# Patient Record
Sex: Female | Born: 1940
Health system: Southern US, Community
[De-identification: ages and names within clinical notes are randomized; demographics above are authoritative.]

## PROBLEM LIST (undated history)

## (undated) DIAGNOSIS — H9190 Unspecified hearing loss, unspecified ear: Secondary | ICD-10-CM

## (undated) DIAGNOSIS — C801 Malignant (primary) neoplasm, unspecified: Secondary | ICD-10-CM

## (undated) DIAGNOSIS — R112 Nausea with vomiting, unspecified: Secondary | ICD-10-CM

## (undated) DIAGNOSIS — I10 Essential (primary) hypertension: Secondary | ICD-10-CM

## (undated) DIAGNOSIS — F329 Major depressive disorder, single episode, unspecified: Secondary | ICD-10-CM

## (undated) DIAGNOSIS — R002 Palpitations: Secondary | ICD-10-CM

## (undated) DIAGNOSIS — K219 Gastro-esophageal reflux disease without esophagitis: Secondary | ICD-10-CM

## (undated) DIAGNOSIS — M4317 Spondylolisthesis, lumbosacral region: Secondary | ICD-10-CM

## (undated) DIAGNOSIS — L0390 Cellulitis, unspecified: Secondary | ICD-10-CM

## (undated) DIAGNOSIS — Z9889 Other specified postprocedural states: Secondary | ICD-10-CM

## (undated) DIAGNOSIS — M1990 Unspecified osteoarthritis, unspecified site: Secondary | ICD-10-CM

## (undated) DIAGNOSIS — F1010 Alcohol abuse, uncomplicated: Secondary | ICD-10-CM

## (undated) DIAGNOSIS — F419 Anxiety disorder, unspecified: Secondary | ICD-10-CM

## (undated) DIAGNOSIS — T4145XA Adverse effect of unspecified anesthetic, initial encounter: Secondary | ICD-10-CM

## (undated) DIAGNOSIS — F32A Depression, unspecified: Secondary | ICD-10-CM

## (undated) DIAGNOSIS — N390 Urinary tract infection, site not specified: Secondary | ICD-10-CM

## (undated) DIAGNOSIS — G9340 Encephalopathy, unspecified: Secondary | ICD-10-CM

## (undated) HISTORY — PX: BREAST SURGERY: SHX581

## (undated) HISTORY — PX: EYE SURGERY: SHX253

## (undated) HISTORY — PX: TONSILLECTOMY: SUR1361

## (undated) HISTORY — PX: TUBAL LIGATION: SHX77

---

## 1998-05-28 ENCOUNTER — Ambulatory Visit (HOSPITAL_BASED_OUTPATIENT_CLINIC_OR_DEPARTMENT_OTHER): Admission: RE | Admit: 1998-05-28 | Discharge: 1998-05-28 | Payer: Self-pay | Admitting: General Surgery

## 1999-06-05 ENCOUNTER — Other Ambulatory Visit: Admission: RE | Admit: 1999-06-05 | Discharge: 1999-06-05 | Payer: Self-pay | Admitting: Obstetrics and Gynecology

## 2007-03-18 DIAGNOSIS — T8859XA Other complications of anesthesia, initial encounter: Secondary | ICD-10-CM

## 2007-03-18 HISTORY — DX: Other complications of anesthesia, initial encounter: T88.59XA

## 2007-03-18 HISTORY — PX: OTHER SURGICAL HISTORY: SHX169

## 2007-10-22 ENCOUNTER — Other Ambulatory Visit: Admission: RE | Admit: 2007-10-22 | Discharge: 2007-10-22 | Payer: Self-pay | Admitting: Family Medicine

## 2009-03-17 HISTORY — PX: COLONOSCOPY: SHX174

## 2009-03-29 ENCOUNTER — Encounter: Admission: RE | Admit: 2009-03-29 | Discharge: 2009-03-29 | Payer: Self-pay | Admitting: Neurosurgery

## 2009-04-11 ENCOUNTER — Encounter: Admission: RE | Admit: 2009-04-11 | Discharge: 2009-04-11 | Payer: Self-pay | Admitting: Neurosurgery

## 2009-04-20 ENCOUNTER — Encounter: Admission: RE | Admit: 2009-04-20 | Discharge: 2009-04-20 | Payer: Self-pay | Admitting: Family Medicine

## 2009-04-23 ENCOUNTER — Encounter: Admission: RE | Admit: 2009-04-23 | Discharge: 2009-04-23 | Payer: Self-pay | Admitting: Family Medicine

## 2009-05-18 ENCOUNTER — Encounter: Admission: RE | Admit: 2009-05-18 | Discharge: 2009-05-18 | Payer: Self-pay | Admitting: Neurosurgery

## 2009-09-14 HISTORY — PX: JOINT REPLACEMENT: SHX530

## 2009-09-24 ENCOUNTER — Inpatient Hospital Stay (HOSPITAL_COMMUNITY): Admission: RE | Admit: 2009-09-24 | Discharge: 2009-09-26 | Payer: Self-pay | Admitting: Orthopedic Surgery

## 2009-10-06 ENCOUNTER — Ambulatory Visit: Payer: Self-pay | Admitting: Vascular Surgery

## 2009-10-06 ENCOUNTER — Encounter (INDEPENDENT_AMBULATORY_CARE_PROVIDER_SITE_OTHER): Payer: Self-pay | Admitting: Emergency Medicine

## 2009-10-06 ENCOUNTER — Emergency Department (HOSPITAL_COMMUNITY): Admission: EM | Admit: 2009-10-06 | Discharge: 2009-10-06 | Payer: Self-pay | Admitting: Emergency Medicine

## 2010-06-01 LAB — POCT I-STAT, CHEM 8
BUN: 4 mg/dL — ABNORMAL LOW (ref 6–23)
Calcium, Ion: 1.05 mmol/L — ABNORMAL LOW (ref 1.12–1.32)
Calcium, Ion: 1.15 mmol/L (ref 1.12–1.32)
Creatinine, Ser: 0.9 mg/dL (ref 0.4–1.2)
Glucose, Bld: 122 mg/dL — ABNORMAL HIGH (ref 70–99)
Glucose, Bld: 91 mg/dL (ref 70–99)
HCT: 38 % (ref 36.0–46.0)
Hemoglobin: 12.9 g/dL (ref 12.0–15.0)
Sodium: 136 mEq/L (ref 135–145)

## 2010-06-01 LAB — CBC
HCT: 34.9 % — ABNORMAL LOW (ref 36.0–46.0)
MCH: 32.8 pg (ref 26.0–34.0)
MCHC: 34.2 g/dL (ref 30.0–36.0)
Platelets: 213 10*3/uL (ref 150–400)
RDW: 15.4 % (ref 11.5–15.5)
WBC: 5.9 10*3/uL (ref 4.0–10.5)

## 2010-06-01 LAB — DIFFERENTIAL
Basophils Relative: 0 % (ref 0–1)
Eosinophils Relative: 2 % (ref 0–5)
Lymphocytes Relative: 29 % (ref 12–46)
Lymphs Abs: 1.7 10*3/uL (ref 0.7–4.0)
Monocytes Relative: 9 % (ref 3–12)
Neutro Abs: 3.5 10*3/uL (ref 1.7–7.7)

## 2010-06-02 LAB — CBC
HCT: 30.9 % — ABNORMAL LOW (ref 36.0–46.0)
HCT: 31.4 % — ABNORMAL LOW (ref 36.0–46.0)
HCT: 46.3 % — ABNORMAL HIGH (ref 36.0–46.0)
Hemoglobin: 10.7 g/dL — ABNORMAL LOW (ref 12.0–15.0)
Hemoglobin: 10.8 g/dL — ABNORMAL LOW (ref 12.0–15.0)
Hemoglobin: 15.8 g/dL — ABNORMAL HIGH (ref 12.0–15.0)
MCH: 32.5 pg (ref 26.0–34.0)
MCHC: 34.2 g/dL (ref 30.0–36.0)
MCHC: 34.8 g/dL (ref 30.0–36.0)
MCV: 94.6 fL (ref 78.0–100.0)
MCV: 95.7 fL (ref 78.0–100.0)
RBC: 4.89 MIL/uL (ref 3.87–5.11)
RDW: 13.7 % (ref 11.5–15.5)
WBC: 9 10*3/uL (ref 4.0–10.5)

## 2010-06-02 LAB — COMPREHENSIVE METABOLIC PANEL
ALT: 18 U/L (ref 0–35)
AST: 30 U/L (ref 0–37)
Alkaline Phosphatase: 108 U/L (ref 39–117)
BUN: 14 mg/dL (ref 6–23)
GFR calc Af Amer: >60 mL/min (ref >60)
Total Bilirubin: 0.7 mg/dL (ref 0.3–1.2)

## 2010-06-02 LAB — BASIC METABOLIC PANEL
BUN: 7 mg/dL (ref 6–23)
CO2: 30 mEq/L (ref 19–32)
Calcium: 7.8 mg/dL — ABNORMAL LOW (ref 8.4–10.5)
Chloride: 104 mEq/L (ref 96–112)
Creatinine, Ser: 0.69 mg/dL (ref 0.4–1.2)
Creatinine, Ser: 0.71 mg/dL (ref 0.4–1.2)
GFR calc Af Amer: >60 mL/min (ref >60)
GFR calc non Af Amer: >60 mL/min (ref >60)
Glucose, Bld: 134 mg/dL — ABNORMAL HIGH (ref 70–99)
Sodium: 136 mEq/L (ref 135–145)

## 2010-06-02 LAB — DIFFERENTIAL
Basophils Absolute: 0 10*3/uL (ref 0.0–0.1)
Eosinophils Absolute: 0.1 10*3/uL (ref 0.0–0.7)
Eosinophils Relative: 1 % (ref 0–5)
Lymphocytes Relative: 31 % (ref 12–46)
Monocytes Relative: 10 % (ref 3–12)
Neutro Abs: 3.8 10*3/uL (ref 1.7–7.7)

## 2010-06-02 LAB — URINE CULTURE

## 2010-06-02 LAB — CROSSMATCH

## 2010-06-02 LAB — URINALYSIS, ROUTINE W REFLEX MICROSCOPIC
Glucose, UA: NEGATIVE mg/dL
Nitrite: NEGATIVE
Specific Gravity, Urine: 1.025 (ref 1.005–1.030)
pH: 5.5 (ref 5.0–8.0)

## 2010-06-02 LAB — URINE MICROSCOPIC-ADD ON

## 2010-06-02 LAB — SURGICAL PCR SCREEN: MRSA, PCR: NEGATIVE

## 2010-07-02 ENCOUNTER — Other Ambulatory Visit: Payer: Self-pay | Admitting: Family Medicine

## 2010-07-02 DIAGNOSIS — K829 Disease of gallbladder, unspecified: Secondary | ICD-10-CM

## 2010-07-03 ENCOUNTER — Ambulatory Visit
Admission: RE | Admit: 2010-07-03 | Discharge: 2010-07-03 | Disposition: A | Discharge status: Home / Self Care | Payer: Medicare Other | Source: Ambulatory Visit | Attending: Family Medicine | Admitting: Family Medicine

## 2010-07-03 DIAGNOSIS — K829 Disease of gallbladder, unspecified: Secondary | ICD-10-CM

## 2010-07-17 ENCOUNTER — Other Ambulatory Visit: Payer: Self-pay | Admitting: Gastroenterology

## 2010-07-17 DIAGNOSIS — K7689 Other specified diseases of liver: Secondary | ICD-10-CM

## 2010-07-23 ENCOUNTER — Other Ambulatory Visit: Payer: Self-pay | Admitting: Gastroenterology

## 2010-07-23 ENCOUNTER — Ambulatory Visit
Admission: RE | Admit: 2010-07-23 | Discharge: 2010-07-23 | Disposition: A | Discharge status: Home / Self Care | Payer: Medicare Other | Source: Ambulatory Visit | Attending: Gastroenterology | Admitting: Gastroenterology

## 2010-07-23 DIAGNOSIS — K7689 Other specified diseases of liver: Secondary | ICD-10-CM

## 2010-07-23 DIAGNOSIS — K769 Liver disease, unspecified: Secondary | ICD-10-CM

## 2010-07-27 ENCOUNTER — Other Ambulatory Visit: Payer: Medicare Other

## 2010-08-01 ENCOUNTER — Ambulatory Visit
Admission: RE | Admit: 2010-08-01 | Discharge: 2010-08-01 | Disposition: A | Discharge status: Home / Self Care | Payer: Medicare Other | Source: Ambulatory Visit | Attending: Gastroenterology | Admitting: Gastroenterology

## 2010-08-01 DIAGNOSIS — K769 Liver disease, unspecified: Secondary | ICD-10-CM

## 2010-08-01 MED ORDER — GADOBENATE DIMEGLUMINE 529 MG/ML IV SOLN
20.0000 mL | Freq: Once | INTRAVENOUS | Status: AC | PRN
  Administered 2010-08-01: 20 mL via INTRAVENOUS

## 2011-01-29 ENCOUNTER — Other Ambulatory Visit: Payer: Self-pay | Admitting: Gastroenterology

## 2011-01-29 DIAGNOSIS — K769 Liver disease, unspecified: Secondary | ICD-10-CM

## 2011-02-04 ENCOUNTER — Ambulatory Visit
Admission: RE | Admit: 2011-02-04 | Discharge: 2011-02-04 | Disposition: A | Discharge status: Home / Self Care | Payer: Medicare Other | Source: Ambulatory Visit | Attending: Gastroenterology | Admitting: Gastroenterology

## 2011-02-04 DIAGNOSIS — K769 Liver disease, unspecified: Secondary | ICD-10-CM

## 2011-02-04 MED ORDER — GADOBENATE DIMEGLUMINE 529 MG/ML IV SOLN: 19.0000 mL | Freq: Once | INTRAVENOUS | Status: AC | PRN

## 2011-02-04 MED ORDER — GADOBENATE DIMEGLUMINE 529 MG/ML IV SOLN
19.0000 mL | Freq: Once | INTRAVENOUS | Status: AC | PRN
  Administered 2011-02-04: 19 mL via INTRAVENOUS

## 2011-12-08 ENCOUNTER — Inpatient Hospital Stay: Admit: 2011-12-08 | Payer: Self-pay | Admitting: Orthopedic Surgery

## 2011-12-08 SURGERY — ARTHROPLASTY, KNEE, TOTAL
Anesthesia: Regional | Laterality: Right

## 2012-01-09 ENCOUNTER — Other Ambulatory Visit: Payer: Self-pay | Admitting: Gastroenterology

## 2012-01-09 DIAGNOSIS — K769 Liver disease, unspecified: Secondary | ICD-10-CM

## 2012-01-20 ENCOUNTER — Other Ambulatory Visit: Payer: Medicare Other

## 2012-01-27 ENCOUNTER — Other Ambulatory Visit: Payer: Medicare Other

## 2012-02-25 ENCOUNTER — Other Ambulatory Visit: Payer: Medicare Other

## 2012-03-24 ENCOUNTER — Other Ambulatory Visit: Payer: Medicare Other

## 2012-03-30 ENCOUNTER — Inpatient Hospital Stay: Admission: RE | Admit: 2012-03-30 | Payer: Medicare Other | Source: Ambulatory Visit

## 2012-04-06 ENCOUNTER — Other Ambulatory Visit: Payer: Medicare Other

## 2012-04-15 ENCOUNTER — Other Ambulatory Visit: Payer: Medicare Other

## 2012-04-26 ENCOUNTER — Other Ambulatory Visit: Payer: Medicare Other

## 2012-04-28 ENCOUNTER — Other Ambulatory Visit: Payer: Medicare Other

## 2012-05-04 ENCOUNTER — Other Ambulatory Visit: Payer: Medicare Other

## 2012-05-11 ENCOUNTER — Ambulatory Visit: Admission: RE | Admit: 2012-05-11 | Payer: Medicare Other | Source: Ambulatory Visit

## 2012-06-24 ENCOUNTER — Other Ambulatory Visit: Payer: Medicare Other

## 2012-06-29 ENCOUNTER — Ambulatory Visit
Admission: RE | Admit: 2012-06-29 | Discharge: 2012-06-29 | Disposition: A | Discharge status: Home / Self Care | Payer: Medicare Other | Source: Ambulatory Visit | Attending: Gastroenterology | Admitting: Gastroenterology

## 2012-06-29 DIAGNOSIS — K769 Liver disease, unspecified: Secondary | ICD-10-CM

## 2012-06-29 MED ORDER — GADOBENATE DIMEGLUMINE 529 MG/ML IV SOLN
19.0000 mL | Freq: Once | INTRAVENOUS | Status: AC | PRN
  Administered 2012-06-29: 19 mL via INTRAVENOUS

## 2013-06-06 ENCOUNTER — Other Ambulatory Visit: Payer: Self-pay | Admitting: Family Medicine

## 2013-06-06 DIAGNOSIS — M79606 Pain in leg, unspecified: Secondary | ICD-10-CM

## 2013-06-07 ENCOUNTER — Ambulatory Visit
Admission: RE | Admit: 2013-06-07 | Discharge: 2013-06-07 | Disposition: A | Discharge status: Home / Self Care | Payer: Medicare Other | Source: Ambulatory Visit | Attending: Family Medicine | Admitting: Family Medicine

## 2013-06-07 DIAGNOSIS — M79606 Pain in leg, unspecified: Secondary | ICD-10-CM

## 2013-08-02 ENCOUNTER — Other Ambulatory Visit (HOSPITAL_COMMUNITY): Payer: Medicare Other

## 2013-08-16 ENCOUNTER — Ambulatory Visit (HOSPITAL_COMMUNITY): Payer: Medicare Other | Attending: Family Medicine | Admitting: Cardiology

## 2013-08-16 ENCOUNTER — Other Ambulatory Visit (HOSPITAL_COMMUNITY): Payer: Self-pay | Admitting: Family Medicine

## 2013-08-16 DIAGNOSIS — R609 Edema, unspecified: Secondary | ICD-10-CM | POA: Insufficient documentation

## 2013-08-16 NOTE — Progress Notes (Signed)
Echo performed. 

## 2013-08-18 ENCOUNTER — Telehealth: Payer: Self-pay | Admitting: *Deleted

## 2013-08-18 NOTE — Progress Notes (Signed)
Quick Note:  LMTCB 6/4 ______ 

## 2013-08-19 NOTE — Telephone Encounter (Deleted)
Error

## 2013-09-20 ENCOUNTER — Encounter (HOSPITAL_COMMUNITY): Payer: Medicare Other

## 2013-11-10 ENCOUNTER — Ambulatory Visit (INDEPENDENT_AMBULATORY_CARE_PROVIDER_SITE_OTHER): Payer: Medicare Other | Admitting: Cardiology

## 2013-11-10 ENCOUNTER — Encounter: Payer: Self-pay | Admitting: Cardiology

## 2013-11-10 VITALS — BP 150/100 | HR 96 | Ht 67.5 in | Wt 219.0 lb

## 2013-11-10 DIAGNOSIS — M25561 Pain in right knee: Secondary | ICD-10-CM | POA: Insufficient documentation

## 2013-11-10 DIAGNOSIS — Z01810 Encounter for preprocedural cardiovascular examination: Secondary | ICD-10-CM

## 2013-11-10 DIAGNOSIS — I10 Essential (primary) hypertension: Secondary | ICD-10-CM | POA: Insufficient documentation

## 2013-11-10 DIAGNOSIS — M25569 Pain in unspecified knee: Secondary | ICD-10-CM

## 2013-11-10 DIAGNOSIS — E669 Obesity, unspecified: Secondary | ICD-10-CM

## 2013-11-10 DIAGNOSIS — I4460 Unspecified fascicular block: Secondary | ICD-10-CM

## 2013-11-10 DIAGNOSIS — I444 Left anterior fascicular block: Secondary | ICD-10-CM

## 2013-11-10 NOTE — Progress Notes (Signed)
[facility]. [address] [place], [place]  [address] Phone: [phone] Fax:  [phone]  Date:  11/10/2013   ID:  CATRENA VARI, DOB 10/09/1940, MRN [id number]  PCP:  No primary provider on file.   History of Present Illness: Denise Kelly is a 73 y.o. female here for preoperative risk stratification for left knee surgery, Dr. Ronnie Derby. Overall, she has been doing very well. She does not complain of any current chest pain, shortness of breath, syncope. She has been able to complete housework without difficulty. Of course, her left knee is limiting her from performing extreme physical activity.   Interestingly, she states that for 15 years while taking Toprol, she said that she had chest pain and thought that she was going to die. Since stopping this medication, she has felt much better.   She's had several cardiac catheterizations and stress test which came back normal per patient. A most recent event monitor in February 2009 showed rare PVCs. Last stress test 2/11-low risk with no ischemia.   Her blood pressure currently is elevated, asymptomatic. Dr. Justin Mend has been monitoring this closely. Usually it is within normal range.  For leg edema, and echocardiogram was ordered and was reassuring, normal ejection fraction. Normal right-sided structure.   Wt Readings from Last 3 Encounters:  11/10/13 219 lb (99.338 kg)     No past medical history on file.  No past surgical history on file.  Current Outpatient Prescriptions  Medication Sig Dispense Refill  . citalopram (CELEXA) 10 MG tablet       . furosemide (LASIX) 20 MG tablet 20 mg as needed.      . gabapentin (NEURONTIN) 100 MG capsule 100 mg at bedtime.      . hydrOXYzine (ATARAX/VISTARIL) 25 MG tablet       . methocarbamol (ROBAXIN) 500 MG tablet       . ondansetron (ZOFRAN-ODT) 4 MG disintegrating tablet       . traMADol (ULTRAM) 50 MG tablet as needed.       No current facility-administered medications for this  visit.    Allergies:    Allergies  Allergen Reactions  . Sulfa Antibiotics Hives and Swelling  . Ciprofloxacin Itching    Social History:  The patient  reports that she has quit smoking. She does not have any smokeless tobacco history on file.   No family history on file.  No early family history of coronary artery disease  ROS:  Please see the history of present illness.   Right knee pain, right leg swelling.   All other systems reviewed and negative.   PHYSICAL EXAM: VS:  BP 150/100  Pulse 96  Ht 5' 7.5" (1.715 m)  Wt 219 lb (99.338 kg)  BMI 33.77 kg/m2 Well nourished, well developed, in no acute distress HEENT: normal, [place]/AT, EOMI Neck: no JVD, normal carotid upstroke, no bruit Cardiac:  normal S1, S2; RRR; no murmur Lungs:  clear to auscultation bilaterally, no wheezing, rhonchi or rales Abd: soft, nontender, no hepatomegaly, no bruits Ext: no edema, 2+ distal pulses Skin: warm and dry GU: deferred Neuro: no focal abnormalities noted, AAO x 3  EKG:  Sinus rhythm 96, left anterior fascicular block. QT interval prolonged, 110 ms, QTC measured 517 ms.  ASSESSMENT AND PLAN:  1. Preoperative risk stratification for right knee replacement secondary to knee pain - she may proceed from a cardiac perspective. She should be low risk based upon her lack of anginal  symptoms, shortness of breath. Prior nuclear stress test in 2011 reassuring. Her blood pressure was elevated today however it has been in normal range under the care of Dr. Justin Mend. 2. Hypertension-continue to follow with Dr. Justin Mend. She stopped her Toprol secondary to undue side effects. 3. Obesity-encourage weight loss. 4. We will see back on an as-needed basis.  Signed, Candee Furbish, MD [facility]  11/10/2013 4:01 PM

## 2013-11-10 NOTE — Patient Instructions (Signed)
The current medical regimen is effective;  continue present plan and medications.  You have been cleared for surgery.  Follow up as needed. 

## 2013-11-11 ENCOUNTER — Telehealth: Payer: Self-pay | Admitting: Cardiology

## 2013-11-11 ENCOUNTER — Encounter: Payer: Self-pay | Admitting: Cardiology

## 2013-11-11 NOTE — Telephone Encounter (Signed)
Received request from Nurse fax box, documents faxed for surgical clearance. To: [facility]  Fax number: [phone] Attention: 8.28.15/km

## 2013-11-28 ENCOUNTER — Other Ambulatory Visit: Payer: Self-pay | Admitting: Gastroenterology

## 2013-12-05 ENCOUNTER — Other Ambulatory Visit: Payer: Self-pay | Admitting: Orthopedic Surgery

## 2013-12-20 ENCOUNTER — Other Ambulatory Visit: Payer: Self-pay | Admitting: Obstetrics and Gynecology

## 2013-12-20 ENCOUNTER — Other Ambulatory Visit (HOSPITAL_COMMUNITY)
Admission: RE | Admit: 2013-12-20 | Discharge: 2013-12-20 | Disposition: A | Discharge status: Home / Self Care | Payer: Medicare Other | Source: Ambulatory Visit | Attending: Obstetrics and Gynecology | Admitting: Obstetrics and Gynecology

## 2013-12-20 DIAGNOSIS — Z1151 Encounter for screening for human papillomavirus (HPV): Secondary | ICD-10-CM | POA: Insufficient documentation

## 2013-12-20 DIAGNOSIS — Z124 Encounter for screening for malignant neoplasm of cervix: Secondary | ICD-10-CM | POA: Insufficient documentation

## 2013-12-23 ENCOUNTER — Inpatient Hospital Stay (HOSPITAL_COMMUNITY): Admission: RE | Admit: 2013-12-23 | Payer: Medicare Other | Source: Ambulatory Visit

## 2013-12-27 NOTE — Telephone Encounter (Signed)
error 

## 2013-12-29 ENCOUNTER — Encounter (HOSPITAL_COMMUNITY)
Admission: RE | Admit: 2013-12-29 | Discharge: 2013-12-29 | Disposition: A | Discharge status: Home / Self Care | Payer: Medicare Other | Source: Ambulatory Visit | Attending: Orthopedic Surgery | Admitting: Orthopedic Surgery

## 2013-12-29 ENCOUNTER — Encounter (HOSPITAL_COMMUNITY): Payer: Self-pay

## 2013-12-29 DIAGNOSIS — I444 Left anterior fascicular block: Secondary | ICD-10-CM | POA: Diagnosis not present

## 2013-12-29 DIAGNOSIS — M25561 Pain in right knee: Secondary | ICD-10-CM | POA: Insufficient documentation

## 2013-12-29 DIAGNOSIS — N390 Urinary tract infection, site not specified: Secondary | ICD-10-CM | POA: Insufficient documentation

## 2013-12-29 DIAGNOSIS — Z01818 Encounter for other preprocedural examination: Secondary | ICD-10-CM | POA: Diagnosis present

## 2013-12-29 HISTORY — DX: Palpitations: R00.2

## 2013-12-29 HISTORY — DX: Adverse effect of unspecified anesthetic, initial encounter: T41.45XA

## 2013-12-29 HISTORY — DX: Malignant (primary) neoplasm, unspecified: C80.1

## 2013-12-29 HISTORY — DX: Anxiety disorder, unspecified: F41.9

## 2013-12-29 HISTORY — DX: Essential (primary) hypertension: I10

## 2013-12-29 HISTORY — DX: Gastro-esophageal reflux disease without esophagitis: K21.9

## 2013-12-29 HISTORY — DX: Unspecified osteoarthritis, unspecified site: M19.90

## 2013-12-29 LAB — COMPREHENSIVE METABOLIC PANEL
ALBUMIN: 3.6 g/dL (ref 3.5–5.2)
ALT: 28 U/L (ref 0–35)
ANION GAP: 13 (ref 5–15)
AST: 35 U/L (ref 0–37)
Alkaline Phosphatase: 113 U/L (ref 39–117)
BUN: 11 mg/dL (ref 6–23)
CO2: 28 mEq/L (ref 19–32)
CREATININE: 0.78 mg/dL (ref 0.50–1.10)
Calcium: 9 mg/dL (ref 8.4–10.5)
Chloride: 100 mEq/L (ref 96–112)
GFR calc Af Amer: >90 mL/min (ref >90)
GFR calc non Af Amer: 82 mL/min — ABNORMAL LOW (ref >90)
Glucose, Bld: 101 mg/dL — ABNORMAL HIGH (ref 70–99)
Potassium: 4 mEq/L (ref 3.7–5.3)
Sodium: 141 mEq/L (ref 137–147)
TOTAL PROTEIN: 6.9 g/dL (ref 6.0–8.3)
Total Bilirubin: 0.7 mg/dL (ref 0.3–1.2)

## 2013-12-29 LAB — CBC WITH DIFFERENTIAL/PLATELET
BASOS PCT: 1 % (ref 0–1)
Basophils Absolute: 0 10*3/uL (ref 0.0–0.1)
EOS ABS: 0.1 10*3/uL (ref 0.0–0.7)
Eosinophils Relative: 2 % (ref 0–5)
HEMATOCRIT: 44.8 % (ref 36.0–46.0)
HEMOGLOBIN: 15 g/dL (ref 12.0–15.0)
Lymphocytes Relative: 40 % (ref 12–46)
Lymphs Abs: 2.8 10*3/uL (ref 0.7–4.0)
MCH: 32.4 pg (ref 26.0–34.0)
MCHC: 33.5 g/dL (ref 30.0–36.0)
MCV: 96.8 fL (ref 78.0–100.0)
MONO ABS: 0.7 10*3/uL (ref 0.1–1.0)
MONOS PCT: 9 % (ref 3–12)
NEUTROS PCT: 48 % (ref 43–77)
Neutro Abs: 3.3 10*3/uL (ref 1.7–7.7)
Platelets: 148 10*3/uL — ABNORMAL LOW (ref 150–400)
RBC: 4.63 MIL/uL (ref 3.87–5.11)
RDW: 12.1 % (ref 11.5–15.5)
WBC: 7 10*3/uL (ref 4.0–10.5)

## 2013-12-29 LAB — URINALYSIS, ROUTINE W REFLEX MICROSCOPIC
Bilirubin Urine: NEGATIVE
Glucose, UA: NEGATIVE mg/dL
Ketones, ur: NEGATIVE mg/dL
LEUKOCYTES UA: NEGATIVE
Nitrite: NEGATIVE
PROTEIN: NEGATIVE mg/dL
Specific Gravity, Urine: 1.023 (ref 1.005–1.030)
Urobilinogen, UA: 0.2 mg/dL (ref 0.0–1.0)
pH: 6.5 (ref 5.0–8.0)

## 2013-12-29 LAB — SURGICAL PCR SCREEN
MRSA, PCR: NEGATIVE
Staphylococcus aureus: POSITIVE — AB

## 2013-12-29 LAB — URINE MICROSCOPIC-ADD ON

## 2013-12-29 LAB — APTT: aPTT: 29 seconds (ref 24–37)

## 2013-12-29 LAB — PROTIME-INR
INR: 1.16 (ref 0.00–1.49)
PROTHROMBIN TIME: 14.9 s (ref 11.6–15.2)

## 2013-12-29 NOTE — Pre-Procedure Instructions (Signed)
OLAR SANTINI  12/29/2013   Your procedure is scheduled on:  Monday, October 19th  Report to [facility] at [phone] AM.  Call this number if you have problems the morning of surgery: [phone]   Remember:   Do not eat food or drink liquids after midnight.   Take these medicines the morning of surgery with A SIP OF WATER: celexa, pain medication if needed   Do not wear jewelry, make-up or nail polish.  Do not wear lotions, powders, or perfumes. You may wear deodorant.  Do not shave 48 hours prior to surgery. Men may shave face and neck.  Do not bring valuables to the hospital.  [facility] is not responsible   for any belongings or valuables.               Contacts, dentures or bridgework may not be worn into surgery.  Leave suitcase in the car. After surgery it may be brought to your room.  For patients admitted to the hospital, discharge time is determined by your  treatment team.         Please read over the following fact sheets that you were given: Pain Booklet, Coughing and Deep Breathing, MRSA Information and Surgical Site Infection Prevention [place] - Preparing for Surgery  Before surgery, you can play an important role.  Because skin is not sterile, your skin needs to be as free of germs as possible.  You can reduce the number of germs on you skin by washing with CHG (chlorahexidine gluconate) soap before surgery.  CHG is an antiseptic cleaner which kills germs and bonds with the skin to continue killing germs even after washing.  Please DO NOT use if you have an allergy to CHG or antibacterial soaps.  If your skin becomes reddened/irritated stop using the CHG and inform your nurse when you arrive at Short Stay.  Do not shave (including legs and underarms) for at least 48 hours prior to the first CHG shower.  You may shave your face.  Please follow these instructions carefully:   1.  Shower with CHG Soap the night before surgery and the morning of  Surgery.  2.  If you choose to wash your hair, wash your hair first as usual with your normal shampoo.  3.  After you shampoo, rinse your hair and body thoroughly to remove the shampoo.  4.  Use CHG as you would any other liquid soap.  You can apply CHG directly to the skin and wash gently with scrungie or a clean washcloth.  5.  Apply the CHG Soap to your body ONLY FROM THE NECK DOWN.  Do not use on open wounds or open sores.  Avoid contact with your eyes, ears, mouth and genitals (private parts).  Wash genitals (private parts) with your normal soap.  6.  Wash thoroughly, paying special attention to the area where your surgery will be performed.  7.  Thoroughly rinse your body with warm water from the neck down.  8.  DO NOT shower/wash with your normal soap after using and rinsing off the CHG Soap.  9.  Pat yourself dry with a clean towel.            10.  Wear clean pajamas.            11.  Place clean sheets on your bed the night of your first shower and do not sleep with pets.  Day of Surgery  Do not  apply any lotions/deoderants the morning of surgery.  Please wear clean clothes to the hospital/surgery center.

## 2013-12-29 NOTE — Progress Notes (Signed)
Primary - [facility] Cardiologist - dr. Marlou Porch prn ekg in epic from august 2015 Echo from June 2015 Heart clearance in epic

## 2013-12-29 NOTE — Progress Notes (Signed)
Mupirocin Ointment Rx called in [facility] at [facility] for positive PCR of staph. Left message on pt's VM notifying her of positive staph.

## 2013-12-30 LAB — URINE CULTURE: Colony Count: 40000

## 2014-01-01 MED ORDER — SODIUM CHLORIDE 0.9 % IV SOLN
1000.0000 mg | INTRAVENOUS | Status: AC
  Administered 2014-01-02: 1000 mg via INTRAVENOUS
  Filled 2014-01-01: qty 10

## 2014-01-01 MED ORDER — SODIUM CHLORIDE 0.9 % IV SOLN: INTRAVENOUS | Status: DC

## 2014-01-01 MED ORDER — BUPIVACAINE LIPOSOME 1.3 % IJ SUSP
20.0000 mL | Freq: Once | INTRAMUSCULAR | Status: DC
  Filled 2014-01-01: qty 20

## 2014-01-01 MED ORDER — CEFAZOLIN SODIUM-DEXTROSE 2-3 GM-% IV SOLR
2.0000 g | INTRAVENOUS | Status: DC
  Filled 2014-01-01: qty 50

## 2014-01-02 ENCOUNTER — Inpatient Hospital Stay (HOSPITAL_COMMUNITY): Payer: Medicare Other | Admitting: Anesthesiology

## 2014-01-02 ENCOUNTER — Encounter (HOSPITAL_COMMUNITY): Payer: Medicare Other | Admitting: Anesthesiology

## 2014-01-02 ENCOUNTER — Inpatient Hospital Stay (HOSPITAL_COMMUNITY)
Admission: RE | Admit: 2014-01-02 | Discharge: 2014-01-06 | DRG: 470 | Disposition: A | Discharge status: Home Health | Payer: Medicare Other | Source: Ambulatory Visit | Attending: Orthopedic Surgery | Admitting: Orthopedic Surgery

## 2014-01-02 ENCOUNTER — Encounter (HOSPITAL_COMMUNITY): Payer: Self-pay | Admitting: Anesthesiology

## 2014-01-02 ENCOUNTER — Encounter (HOSPITAL_COMMUNITY)
Admission: RE | Disposition: A | Discharge status: Home Health | Payer: Self-pay | Source: Ambulatory Visit | Attending: Orthopedic Surgery

## 2014-01-02 DIAGNOSIS — Z6833 Body mass index (BMI) 33.0-33.9, adult: Secondary | ICD-10-CM | POA: Diagnosis not present

## 2014-01-02 DIAGNOSIS — M1711 Unilateral primary osteoarthritis, right knee: Secondary | ICD-10-CM | POA: Diagnosis not present

## 2014-01-02 DIAGNOSIS — Z882 Allergy status to sulfonamides status: Secondary | ICD-10-CM | POA: Diagnosis not present

## 2014-01-02 DIAGNOSIS — Z881 Allergy status to other antibiotic agents status: Secondary | ICD-10-CM | POA: Diagnosis not present

## 2014-01-02 DIAGNOSIS — Z96652 Presence of left artificial knee joint: Secondary | ICD-10-CM | POA: Diagnosis present

## 2014-01-02 DIAGNOSIS — Z87891 Personal history of nicotine dependence: Secondary | ICD-10-CM

## 2014-01-02 DIAGNOSIS — Z96651 Presence of right artificial knee joint: Secondary | ICD-10-CM

## 2014-01-02 DIAGNOSIS — E669 Obesity, unspecified: Secondary | ICD-10-CM | POA: Diagnosis present

## 2014-01-02 DIAGNOSIS — M25561 Pain in right knee: Secondary | ICD-10-CM

## 2014-01-02 DIAGNOSIS — D62 Acute posthemorrhagic anemia: Secondary | ICD-10-CM | POA: Diagnosis not present

## 2014-01-02 DIAGNOSIS — Z96659 Presence of unspecified artificial knee joint: Secondary | ICD-10-CM

## 2014-01-02 DIAGNOSIS — M25559 Pain in unspecified hip: Secondary | ICD-10-CM

## 2014-01-02 DIAGNOSIS — I10 Essential (primary) hypertension: Secondary | ICD-10-CM | POA: Diagnosis present

## 2014-01-02 HISTORY — PX: TOTAL KNEE ARTHROPLASTY: SHX125

## 2014-01-02 LAB — CBC
HEMATOCRIT: 41.5 % (ref 36.0–46.0)
Hemoglobin: 14.2 g/dL (ref 12.0–15.0)
MCH: 32.5 pg (ref 26.0–34.0)
MCHC: 34.2 g/dL (ref 30.0–36.0)
MCV: 95 fL (ref 78.0–100.0)
Platelets: 139 10*3/uL — ABNORMAL LOW (ref 150–400)
RBC: 4.37 MIL/uL (ref 3.87–5.11)
RDW: 11.7 % (ref 11.5–15.5)
WBC: 6.4 10*3/uL (ref 4.0–10.5)

## 2014-01-02 LAB — CREATININE, SERUM
Creatinine, Ser: 0.73 mg/dL (ref 0.50–1.10)
GFR calc Af Amer: >90 mL/min (ref >90)
GFR, EST NON AFRICAN AMERICAN: 83 mL/min — AB (ref >90)

## 2014-01-02 SURGERY — ARTHROPLASTY, KNEE, TOTAL
Anesthesia: General | Site: Knee | Laterality: Right

## 2014-01-02 MED ORDER — HYDROMORPHONE HCL 1 MG/ML IJ SOLN
INTRAMUSCULAR | Status: AC
  Filled 2014-01-02: qty 1

## 2014-01-02 MED ORDER — CHLORHEXIDINE GLUCONATE 4 % EX LIQD: 60.0000 mL | Freq: Once | CUTANEOUS | Status: DC

## 2014-01-02 MED ORDER — ACETAMINOPHEN 325 MG PO TABS
650.0000 mg | ORAL_TABLET | Freq: Four times a day (QID) | ORAL | Status: DC | PRN
  Administered 2014-01-03 – 2014-01-04 (×2): 650 mg via ORAL
  Filled 2014-01-02 (×2): qty 2

## 2014-01-02 MED ORDER — SODIUM CHLORIDE 0.9 % IR SOLN
Status: DC | PRN
  Administered 2014-01-02: 1000 mL

## 2014-01-02 MED ORDER — SCOPOLAMINE 1 MG/3DAYS TD PT72
MEDICATED_PATCH | TRANSDERMAL | Status: AC
  Administered 2014-01-02: 1 via TRANSDERMAL
  Filled 2014-01-02: qty 1

## 2014-01-02 MED ORDER — BUPIVACAINE LIPOSOME 1.3 % IJ SUSP
INTRAMUSCULAR | Status: DC | PRN
  Administered 2014-01-02: 20 mL

## 2014-01-02 MED ORDER — ONDANSETRON HCL 4 MG/2ML IJ SOLN: 4.0000 mg | Freq: Once | INTRAMUSCULAR | Status: DC | PRN

## 2014-01-02 MED ORDER — PROPOFOL 10 MG/ML IV BOLUS
INTRAVENOUS | Status: DC | PRN
  Administered 2014-01-02: 150 mg via INTRAVENOUS

## 2014-01-02 MED ORDER — HYDROMORPHONE HCL 1 MG/ML IJ SOLN
0.5000 mg | INTRAMUSCULAR | Status: DC | PRN
  Administered 2014-01-02 (×4): 0.5 mg via INTRAVENOUS

## 2014-01-02 MED ORDER — ONDANSETRON HCL 4 MG/2ML IJ SOLN
INTRAMUSCULAR | Status: AC
  Filled 2014-01-02: qty 2

## 2014-01-02 MED ORDER — OXYCODONE HCL ER 10 MG PO T12A
10.0000 mg | EXTENDED_RELEASE_TABLET | Freq: Two times a day (BID) | ORAL | Status: DC
  Administered 2014-01-02 – 2014-01-03 (×2): 10 mg via ORAL
  Filled 2014-01-02 (×2): qty 1

## 2014-01-02 MED ORDER — GABAPENTIN 100 MG PO CAPS
300.0000 mg | ORAL_CAPSULE | Freq: Every day | ORAL | Status: DC
  Administered 2014-01-02 – 2014-01-04 (×3): 400 mg via ORAL
  Administered 2014-01-05: 300 mg via ORAL
  Filled 2014-01-02 (×5): qty 4

## 2014-01-02 MED ORDER — HYDROMORPHONE HCL 1 MG/ML IJ SOLN
1.0000 mg | INTRAMUSCULAR | Status: DC | PRN
  Administered 2014-01-02 – 2014-01-06 (×6): 1 mg via INTRAVENOUS
  Filled 2014-01-02 (×7): qty 1

## 2014-01-02 MED ORDER — MIDAZOLAM HCL 2 MG/2ML IJ SOLN
INTRAMUSCULAR | Status: AC
  Filled 2014-01-02: qty 2

## 2014-01-02 MED ORDER — PHENYLEPHRINE HCL 10 MG/ML IJ SOLN
INTRAMUSCULAR | Status: DC | PRN
  Administered 2014-01-02 (×3): 80 ug via INTRAVENOUS

## 2014-01-02 MED ORDER — SENNOSIDES-DOCUSATE SODIUM 8.6-50 MG PO TABS: 1.0000 | ORAL_TABLET | Freq: Every evening | ORAL | Status: DC | PRN

## 2014-01-02 MED ORDER — DOCUSATE SODIUM 100 MG PO CAPS
100.0000 mg | ORAL_CAPSULE | Freq: Two times a day (BID) | ORAL | Status: DC
  Administered 2014-01-02 – 2014-01-06 (×9): 100 mg via ORAL
  Filled 2014-01-02 (×10): qty 1

## 2014-01-02 MED ORDER — METHOCARBAMOL 1000 MG/10ML IJ SOLN
500.0000 mg | INTRAVENOUS | Status: DC
  Filled 2014-01-02: qty 5

## 2014-01-02 MED ORDER — DEXAMETHASONE SODIUM PHOSPHATE 4 MG/ML IJ SOLN
INTRAMUSCULAR | Status: AC
  Filled 2014-01-02: qty 2

## 2014-01-02 MED ORDER — ENOXAPARIN SODIUM 30 MG/0.3ML ~~LOC~~ SOLN
30.0000 mg | Freq: Two times a day (BID) | SUBCUTANEOUS | Status: DC
  Administered 2014-01-03 – 2014-01-06 (×7): 30 mg via SUBCUTANEOUS
  Filled 2014-01-02 (×9): qty 0.3

## 2014-01-02 MED ORDER — SODIUM CHLORIDE 0.9 % IV SOLN
INTRAVENOUS | Status: DC
  Administered 2014-01-02: 15:00:00 via INTRAVENOUS

## 2014-01-02 MED ORDER — MIDAZOLAM HCL 2 MG/2ML IJ SOLN
1.0000 mg | INTRAMUSCULAR | Status: DC | PRN
  Administered 2014-01-02 (×2): 1 mg via INTRAVENOUS

## 2014-01-02 MED ORDER — ACETAMINOPHEN 500 MG PO TABS
1000.0000 mg | ORAL_TABLET | Freq: Four times a day (QID) | ORAL | Status: AC
  Administered 2014-01-03 (×3): 1000 mg via ORAL
  Filled 2014-01-02 (×4): qty 2

## 2014-01-02 MED ORDER — HYDROMORPHONE HCL 1 MG/ML IJ SOLN
0.2500 mg | INTRAMUSCULAR | Status: DC | PRN
  Administered 2014-01-02 (×4): 0.5 mg via INTRAVENOUS

## 2014-01-02 MED ORDER — CITALOPRAM HYDROBROMIDE 10 MG PO TABS
10.0000 mg | ORAL_TABLET | Freq: Every day | ORAL | Status: DC
  Administered 2014-01-02 – 2014-01-06 (×5): 10 mg via ORAL
  Filled 2014-01-02 (×5): qty 1

## 2014-01-02 MED ORDER — METHOCARBAMOL 1000 MG/10ML IJ SOLN
500.0000 mg | Freq: Once | INTRAVENOUS | Status: AC
  Administered 2014-01-02: 500 mg via INTRAVENOUS
  Filled 2014-01-02: qty 5

## 2014-01-02 MED ORDER — LACTATED RINGERS IV SOLN
INTRAVENOUS | Status: DC | PRN
  Administered 2014-01-02: 08:00:00 via INTRAVENOUS

## 2014-01-02 MED ORDER — FENTANYL CITRATE 0.05 MG/ML IJ SOLN
INTRAMUSCULAR | Status: AC
  Filled 2014-01-02: qty 5

## 2014-01-02 MED ORDER — ZOLPIDEM TARTRATE 5 MG PO TABS
5.0000 mg | ORAL_TABLET | Freq: Every evening | ORAL | Status: DC | PRN
  Administered 2014-01-04 – 2014-01-05 (×2): 5 mg via ORAL
  Filled 2014-01-02 (×2): qty 1

## 2014-01-02 MED ORDER — ALUM & MAG HYDROXIDE-SIMETH 200-200-20 MG/5ML PO SUSP
30.0000 mL | ORAL | Status: DC | PRN
Start: 2014-01-02 — End: 2014-01-06

## 2014-01-02 MED ORDER — PHENOL 1.4 % MT LIQD: 1.0000 | OROMUCOSAL | Status: DC | PRN

## 2014-01-02 MED ORDER — OXYCODONE-ACETAMINOPHEN 5-325 MG PO TABS
ORAL_TABLET | ORAL | Status: DC
Start: 2014-01-02 — End: 2014-01-02
  Filled 2014-01-02: qty 2

## 2014-01-02 MED ORDER — DIPHENHYDRAMINE HCL 12.5 MG/5ML PO ELIX
12.5000 mg | ORAL_SOLUTION | ORAL | Status: DC | PRN
  Administered 2014-01-02 – 2014-01-03 (×3): 25 mg via ORAL
  Filled 2014-01-02 (×3): qty 10

## 2014-01-02 MED ORDER — METHOCARBAMOL 1000 MG/10ML IJ SOLN
500.0000 mg | Freq: Four times a day (QID) | INTRAVENOUS | Status: DC | PRN
  Filled 2014-01-02: qty 5

## 2014-01-02 MED ORDER — ONDANSETRON HCL 4 MG PO TABS
4.0000 mg | ORAL_TABLET | Freq: Four times a day (QID) | ORAL | Status: DC | PRN
  Administered 2014-01-05 (×2): 4 mg via ORAL
  Filled 2014-01-02 (×2): qty 1

## 2014-01-02 MED ORDER — ZOLPIDEM TARTRATE 5 MG PO TABS: 5.0000 mg | ORAL_TABLET | Freq: Every evening | ORAL | Status: DC | PRN

## 2014-01-02 MED ORDER — FLEET ENEMA 7-19 GM/118ML RE ENEM: 1.0000 | ENEMA | Freq: Once | RECTAL | Status: AC | PRN

## 2014-01-02 MED ORDER — OXYCODONE HCL 5 MG PO TABS
ORAL_TABLET | ORAL | Status: AC
  Filled 2014-01-02: qty 2

## 2014-01-02 MED ORDER — METHOCARBAMOL 500 MG PO TABS
500.0000 mg | ORAL_TABLET | Freq: Four times a day (QID) | ORAL | Status: DC | PRN
  Administered 2014-01-02 – 2014-01-06 (×10): 500 mg via ORAL
  Filled 2014-01-02 (×11): qty 1

## 2014-01-02 MED ORDER — HYDROMORPHONE HCL 1 MG/ML IJ SOLN
INTRAMUSCULAR | Status: AC
Start: 2014-01-02 — End: 2014-01-03
  Filled 2014-01-02: qty 1

## 2014-01-02 MED ORDER — PROPOFOL 10 MG/ML IV BOLUS
INTRAVENOUS | Status: AC
  Filled 2014-01-02: qty 20

## 2014-01-02 MED ORDER — LORATADINE 10 MG PO TABS: 10.0000 mg | ORAL_TABLET | Freq: Four times a day (QID) | ORAL | Status: DC | PRN

## 2014-01-02 MED ORDER — CEFAZOLIN SODIUM-DEXTROSE 2-3 GM-% IV SOLR
2.0000 g | Freq: Four times a day (QID) | INTRAVENOUS | Status: AC
  Administered 2014-01-02 (×2): 2 g via INTRAVENOUS
  Filled 2014-01-02 (×2): qty 50

## 2014-01-02 MED ORDER — MENTHOL 3 MG MT LOZG: 1.0000 | LOZENGE | OROMUCOSAL | Status: DC | PRN

## 2014-01-02 MED ORDER — DEXAMETHASONE SODIUM PHOSPHATE 4 MG/ML IJ SOLN
INTRAMUSCULAR | Status: DC | PRN
  Administered 2014-01-02: 8 mg via INTRAVENOUS

## 2014-01-02 MED ORDER — HYDROXYZINE HCL 50 MG PO TABS
50.0000 mg | ORAL_TABLET | Freq: Every day | ORAL | Status: DC
  Administered 2014-01-02 – 2014-01-05 (×4): 50 mg via ORAL
  Filled 2014-01-02 (×2): qty 1
  Filled 2014-01-02: qty 2
  Filled 2014-01-02 (×3): qty 1

## 2014-01-02 MED ORDER — METOCLOPRAMIDE HCL 10 MG PO TABS: 5.0000 mg | ORAL_TABLET | Freq: Three times a day (TID) | ORAL | Status: DC | PRN

## 2014-01-02 MED ORDER — FENTANYL CITRATE 0.05 MG/ML IJ SOLN
INTRAMUSCULAR | Status: AC
  Filled 2014-01-02: qty 2

## 2014-01-02 MED ORDER — FUROSEMIDE 20 MG PO TABS
20.0000 mg | ORAL_TABLET | Freq: Every day | ORAL | Status: DC | PRN
  Filled 2014-01-02: qty 1

## 2014-01-02 MED ORDER — LIDOCAINE HCL (CARDIAC) 20 MG/ML IV SOLN
INTRAVENOUS | Status: DC | PRN
  Administered 2014-01-02: 100 mg via INTRAVENOUS

## 2014-01-02 MED ORDER — FENTANYL CITRATE 0.05 MG/ML IJ SOLN
INTRAMUSCULAR | Status: DC | PRN
  Administered 2014-01-02 (×5): 50 ug via INTRAVENOUS

## 2014-01-02 MED ORDER — LIDOCAINE HCL (CARDIAC) 20 MG/ML IV SOLN
INTRAVENOUS | Status: AC
  Filled 2014-01-02: qty 5

## 2014-01-02 MED ORDER — BUPIVACAINE-EPINEPHRINE 0.5% -1:200000 IJ SOLN
INTRAMUSCULAR | Status: DC | PRN
  Administered 2014-01-02: 30 mL

## 2014-01-02 MED ORDER — ONDANSETRON HCL 4 MG/2ML IJ SOLN
INTRAMUSCULAR | Status: DC | PRN
  Administered 2014-01-02: 4 mg via INTRAVENOUS

## 2014-01-02 MED ORDER — ONDANSETRON HCL 4 MG/2ML IJ SOLN
4.0000 mg | Freq: Four times a day (QID) | INTRAMUSCULAR | Status: DC | PRN
  Administered 2014-01-02 – 2014-01-03 (×3): 4 mg via INTRAVENOUS
  Filled 2014-01-02 (×3): qty 2

## 2014-01-02 MED ORDER — ACETAMINOPHEN 650 MG RE SUPP: 650.0000 mg | Freq: Four times a day (QID) | RECTAL | Status: DC | PRN

## 2014-01-02 MED ORDER — METOCLOPRAMIDE HCL 5 MG/ML IJ SOLN: 5.0000 mg | Freq: Three times a day (TID) | INTRAMUSCULAR | Status: DC | PRN

## 2014-01-02 MED ORDER — BISACODYL 5 MG PO TBEC: 5.0000 mg | DELAYED_RELEASE_TABLET | Freq: Every day | ORAL | Status: DC | PRN

## 2014-01-02 MED ORDER — CEFAZOLIN SODIUM-DEXTROSE 2-3 GM-% IV SOLR
INTRAVENOUS | Status: DC | PRN
  Administered 2014-01-02: 2 g via INTRAVENOUS

## 2014-01-02 MED ORDER — LACTATED RINGERS IV SOLN
INTRAVENOUS | Status: DC
  Administered 2014-01-02: 08:00:00 via INTRAVENOUS

## 2014-01-02 MED ORDER — OXYCODONE HCL 5 MG PO TABS
5.0000 mg | ORAL_TABLET | ORAL | Status: DC | PRN
Start: 2014-01-02 — End: 2014-01-06
  Administered 2014-01-02 – 2014-01-04 (×5): 10 mg via ORAL
  Administered 2014-01-04 (×2): 5 mg via ORAL
  Administered 2014-01-04: 10 mg via ORAL
  Administered 2014-01-04: 5 mg via ORAL
  Administered 2014-01-05 (×2): 10 mg via ORAL
  Administered 2014-01-05 (×3): 5 mg via ORAL
  Filled 2014-01-02 (×2): qty 2
  Filled 2014-01-02: qty 1
  Filled 2014-01-02: qty 2
  Filled 2014-01-02: qty 1
  Filled 2014-01-02: qty 2
  Filled 2014-01-02 (×2): qty 1
  Filled 2014-01-02: qty 2
  Filled 2014-01-02: qty 1
  Filled 2014-01-02 (×3): qty 2
  Filled 2014-01-02: qty 1
  Filled 2014-01-02: qty 2

## 2014-01-02 SURGICAL SUPPLY — 56 items
BANDAGE ESMARK 6X9 LF (GAUZE/BANDAGES/DRESSINGS) ×1 IMPLANT
BLADE SAGITTAL 13X1.27X60 (BLADE) ×2 IMPLANT
BLADE SAGITTAL 13X1.27X60MM (BLADE) ×1
BLADE SAW SGTL 83.5X18.5 (BLADE) ×3 IMPLANT
BLADE SURG 10 STRL SS (BLADE) ×3 IMPLANT
BNDG ESMARK 6X9 LF (GAUZE/BANDAGES/DRESSINGS) ×3
BOWL SMART MIX CTS (DISPOSABLE) ×3 IMPLANT
CAP POR TM CP VIT E LN CER HD ×3 IMPLANT
CEMENT BONE SIMPLEX SPEEDSET (Cement) ×6 IMPLANT
COVER SURGICAL LIGHT HANDLE (MISCELLANEOUS) ×3 IMPLANT
CUFF TOURNIQUET SINGLE 34IN LL (TOURNIQUET CUFF) ×3 IMPLANT
DRAPE EXTREMITY T 121X128X90 (DRAPE) ×3 IMPLANT
DRAPE INCISE IOBAN 66X45 STRL (DRAPES) ×6 IMPLANT
DRAPE PROXIMA HALF (DRAPES) IMPLANT
DRAPE U-SHAPE 47X51 STRL (DRAPES) ×3 IMPLANT
DRSG ADAPTIC 3X8 NADH LF (GAUZE/BANDAGES/DRESSINGS) ×3 IMPLANT
DRSG PAD ABDOMINAL 8X10 ST (GAUZE/BANDAGES/DRESSINGS) ×3 IMPLANT
DURAPREP 26ML APPLICATOR (WOUND CARE) ×6 IMPLANT
ELECT REM PT RETURN 9FT ADLT (ELECTROSURGICAL) ×3
ELECTRODE REM PT RTRN 9FT ADLT (ELECTROSURGICAL) ×1 IMPLANT
EVACUATOR 1/8 PVC DRAIN (DRAIN) ×3 IMPLANT
GAUZE SPONGE 4X4 12PLY STRL (GAUZE/BANDAGES/DRESSINGS) ×3 IMPLANT
GLOVE BIOGEL M 7.0 STRL (GLOVE) IMPLANT
GLOVE BIOGEL PI IND STRL 7.5 (GLOVE) IMPLANT
GLOVE BIOGEL PI IND STRL 8.5 (GLOVE) ×2 IMPLANT
GLOVE BIOGEL PI INDICATOR 7.5 (GLOVE)
GLOVE BIOGEL PI INDICATOR 8.5 (GLOVE) ×4
GLOVE SURG ORTHO 8.0 STRL STRW (GLOVE) ×6 IMPLANT
GOWN STRL REUS W/ TWL LRG LVL3 (GOWN DISPOSABLE) ×1 IMPLANT
GOWN STRL REUS W/ TWL XL LVL3 (GOWN DISPOSABLE) ×2 IMPLANT
GOWN STRL REUS W/TWL LRG LVL3 (GOWN DISPOSABLE) ×2
GOWN STRL REUS W/TWL XL LVL3 (GOWN DISPOSABLE) ×4
HANDPIECE INTERPULSE COAX TIP (DISPOSABLE) ×2
HOOD PEEL AWAY FACE SHEILD DIS (HOOD) ×9 IMPLANT
KIT BASIN OR (CUSTOM PROCEDURE TRAY) ×3 IMPLANT
KIT ROOM TURNOVER OR (KITS) ×3 IMPLANT
MANIFOLD NEPTUNE II (INSTRUMENTS) ×3 IMPLANT
NEEDLE 22X1 1/2 (OR ONLY) (NEEDLE) ×6 IMPLANT
NS IRRIG 1000ML POUR BTL (IV SOLUTION) ×3 IMPLANT
PACK TOTAL JOINT (CUSTOM PROCEDURE TRAY) ×3 IMPLANT
PAD ARMBOARD 7.5X6 YLW CONV (MISCELLANEOUS) ×6 IMPLANT
PADDING CAST COTTON 6X4 STRL (CAST SUPPLIES) ×3 IMPLANT
SET HNDPC FAN SPRY TIP SCT (DISPOSABLE) ×1 IMPLANT
SPONGE GAUZE 4X4 12PLY STER LF (GAUZE/BANDAGES/DRESSINGS) ×3 IMPLANT
STAPLER VISISTAT 35W (STAPLE) ×3 IMPLANT
SUCTION FRAZIER TIP 10 FR DISP (SUCTIONS) ×3 IMPLANT
SUT BONE WAX W31G (SUTURE) ×3 IMPLANT
SUT VIC AB 0 CTB1 27 (SUTURE) ×6 IMPLANT
SUT VIC AB 1 CT1 27 (SUTURE) ×4
SUT VIC AB 1 CT1 27XBRD ANBCTR (SUTURE) ×2 IMPLANT
SUT VIC AB 2-0 CT1 27 (SUTURE) ×4
SUT VIC AB 2-0 CT1 TAPERPNT 27 (SUTURE) ×2 IMPLANT
SYR 20CC LL (SYRINGE) ×6 IMPLANT
TOWEL OR 17X24 6PK STRL BLUE (TOWEL DISPOSABLE) ×3 IMPLANT
TOWEL OR 17X26 10 PK STRL BLUE (TOWEL DISPOSABLE) ×3 IMPLANT
WATER STERILE IRR 1000ML POUR (IV SOLUTION) ×6 IMPLANT

## 2014-01-02 NOTE — Evaluation (Signed)
Physical Therapy Evaluation Patient Details Name: Denise Kelly MRN: [id number] DOB: November 09, 1940 Today's Date: 01/02/2014   History of Present Illness  R TKA; WBAT; previous LTKA  Clinical Impression  Pt is s/p TKA resulting in the deficits listed below (see PT Problem List).  Pt will benefit from skilled PT to increase their independence and safety with mobility to allow discharge to the venue listed below.      Follow Up Recommendations Home health PT;Supervision/Assistance - 24 hour    Equipment Recommendations  Rolling walker with 5" wheels;3in1 (PT) (I believe she already has RW)    Recommendations for Other Services OT consult     Precautions / Restrictions Precautions Precautions: Knee Precaution Comments: Pt educated in no pillow under knee for optimal extension range Restrictions Weight Bearing Restrictions: Yes RLE Weight Bearing: Weight bearing as tolerated      Mobility  Bed Mobility Overal bed mobility: Needs Assistance Bed Mobility: Supine to Sit;Sit to Supine     Supine to sit: Min assist Sit to supine: Min assist   General bed mobility comments: Cues for technqiue; Overall moving well, assist for RLE coming off and on the bed  Transfers                 General transfer comment: Ultimately pt became nauseated and requested getting back to bed  Ambulation/Gait                Stairs            Wheelchair Mobility    Modified Rankin (Stroke Patients Only)       Balance Overall balance assessment: Needs assistance Sitting-balance support: Bilateral upper extremity supported;Single extremity supported Sitting balance-Leahy Scale: Good Sitting balance - Comments: Sat EOB for approx 10 minutes; went over knee precautions, dc planning, and what to expect with thereaies while pt was sitting                                     Pertinent Vitals/Pain Pain Assessment: Faces Faces Pain Scale: Hurts little more Pain  Location: R knee Pain Descriptors / Indicators: Grimacing (did not specifically report pain) Pain Intervention(s): Limited activity within patient's tolerance;Monitored during session;Repositioned    Home Living Family/patient expects to be discharged to:: Private residence Living Arrangements: Alone Available Help at Discharge: Family;Available 24 hours/day (daughter plans to stay with pt) Type of Home: House Home Access: Level entry     Home Layout: One level Home Equipment: Walker - 2 wheels;Bedside commode      Prior Function Level of Independence: Independent               Hand Dominance        Extremity/Trunk Assessment   Upper Extremity Assessment: Overall WFL for tasks assessed           Lower Extremity Assessment: RLE deficits/detail RLE Deficits / Details: Good PROM 0-at least 90 R knee; quad activation present       Communication   Communication: No difficulties  Cognition Arousal/Alertness: Awake/alert Behavior During Therapy: WFL for tasks assessed/performed Overall Cognitive Status: Within Functional Limits for tasks assessed                      General Comments      Exercises        Assessment/Plan    PT Assessment Patient needs continued PT services  PT  Diagnosis Difficulty walking   PT Problem List Decreased strength;Decreased range of motion;Decreased activity tolerance;Decreased balance;Decreased mobility;Decreased knowledge of use of DME;Decreased knowledge of precautions;Pain  PT Treatment Interventions DME instruction;Gait training;Functional mobility training;Therapeutic activities;Therapeutic exercise;Patient/family education   PT Goals (Current goals can be found in the Care Plan section) Acute Rehab PT Goals Patient Stated Goal: walk PT Goal Formulation: With patient Time For Goal Achievement: 01/09/14 Potential to Achieve Goals: Good    Frequency 7X/week   Barriers to discharge        Co-evaluation                End of Session   Activity Tolerance:  (Limited by nausea) Patient left: in bed;in CPM;with call bell/phone within reach;with family/visitor present Nurse Communication: Mobility status         Time: 4076-8088 PT Time Calculation (min): 36 min   Charges:   PT Evaluation $Initial PT Evaluation Tier I: 1 Procedure PT Treatments $Therapeutic Activity: 23-37 mins   PT G Codes:          Quin Hoop 01/02/2014, 5:02 PM  Roney Marion, [place] Pager [phone] Office [phone]

## 2014-01-02 NOTE — Anesthesia Procedure Notes (Signed)
Anesthesia Regional Block:  Femoral nerve block  Pre-Anesthetic Checklist: ,, timeout performed, Correct Patient, Correct Site, Correct Laterality, Correct Procedure, Correct Position, site marked, Risks and benefits discussed,  Surgical consent,  Pre-op evaluation,  At surgeon's request and post-op pain management  Laterality: Right  Prep: Maximum Sterile Barrier Precautions used, chloraprep and alcohol swabs       Needles:  Injection technique: Single-shot  Needle Type: Stimulator Needle - 80        Needle insertion depth: 6 cm   Additional Needles:  Procedures: nerve stimulator Femoral nerve block  Nerve Stimulator or Paresthesia:  Response: 0.5 mA, 0.1 ms, 6 cm  Additional Responses:   Narrative:  Start time: 01/02/2014 8:20 AM End time: 01/02/2014 8:26 AM Injection made incrementally with aspirations every 5 mL.  Performed by: Personally  Anesthesiologist: Sharolyn Douglas MD  Additional Notes: Pt accepts procedure w/ risks. 15cc 0.5% Marcaine w/o epi w/o difficulty or discomfort. GES

## 2014-01-02 NOTE — Transfer of Care (Signed)
Immediate Anesthesia Transfer of Care Note  Patient: Denise Kelly  Procedure(s) Performed: Procedure(s): TOTAL KNEE ARTHROPLASTY (Right)  Patient Location: PACU  Anesthesia Type:General  Level of Consciousness: awake, alert , oriented and patient cooperative  Airway & Oxygen Therapy: Patient Spontanous Breathing and Patient connected to nasal cannula oxygen  Post-op Assessment: Report given to PACU RN, Post -op Vital signs reviewed and stable and Patient moving all extremities  Post vital signs: Reviewed and stable  Complications: No apparent anesthesia complications

## 2014-01-02 NOTE — Progress Notes (Signed)
Orthopedic Tech Progress Note Patient Details:  Denise Kelly Sep 08, 1940 [id number] CPM applied to RLE with appropriate settings. OHF applied to bed. Footsie roll provided. CPM Right Knee CPM Right Knee: On Right Knee Flexion (Degrees): 90 Right Knee Extension (Degrees): 0   [name] 01/02/2014, 1:26 PM

## 2014-01-02 NOTE — H&P (Signed)
  Denise Kelly MRN:  [id number] DOB/SEX:  10-08-1940/female  CHIEF COMPLAINT:  Painful right Knee  HISTORY: Patient is a 73 y.o. female presented with a history of pain in the right knee. Onset of symptoms was gradual starting several years ago with gradually worsening course since that time. Prior procedures on the knee include arthroscopy. Patient has been treated conservatively with over-the-counter NSAIDs and activity modification. Patient currently rates pain in the knee at 10 out of 10 with activity. There is pain at night.  PAST MEDICAL HISTORY: Patient Active Problem List   Diagnosis Date Noted  . Right knee pain 11/10/2013  . Essential hypertension, benign 11/10/2013  . LAFB (left anterior fascicular block) 11/10/2013  . Obesity, unspecified 11/10/2013   Past Medical History  Diagnosis Date  . Complication of anesthesia     "felt drunk for a week after"  . Hypertension     not being treated with medication  . Palpitations   . Anxiety   . GERD (gastroesophageal reflux disease)   . Arthritis   . Cancer     skin   Past Surgical History  Procedure Laterality Date  . Joint replacement Left     knee  . Tonsillectomy    . Tubal ligation    . 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 Breast surgery      2 breast biopsies  . Eye surgery Bilateral     cataracts  . Colonoscopy       MEDICATIONS:   No prescriptions prior to admission    ALLERGIES:   Allergies  Allergen Reactions  . Sulfa Antibiotics Hives and Swelling  . Ciprofloxacin Itching    REVIEW OF SYSTEMS:  A comprehensive review of systems was negative.   FAMILY HISTORY:  No family history on file.  SOCIAL HISTORY:   History  Substance Use Topics  . Smoking status: Former Research scientist (life sciences)  . Smokeless tobacco: Not on file  . Alcohol Use: No     EXAMINATION:  Vital signs in last 24 hours:    General appearance: alert, cooperative and no distress Back: symmetric, no curvature. ROM normal. No CVA  tenderness. Heart: regular rate and rhythm, S1, S2 normal, no murmur, click, rub or gallop Abdomen: soft, non-tender; bowel sounds normal; no masses,  no organomegaly Extremities: extremities normal, atraumatic, no cyanosis or edema and Homans sign is negative, no sign of DVT Pulses: 2+ and symmetric Lymph nodes: Cervical, supraclavicular, and axillary nodes normal.  Musculoskeletal:  ROM 0-100, Ligaments intact,  Imaging Review Plain radiographs demonstrate severe degenerative joint disease of the right knee. The overall alignment is significant varus. The bone quality appears to be good for age and reported activity level.  Assessment/Plan: End stage arthritis, right knee   The patient history, physical examination and imaging studies are consistent with advanced degenerative joint disease of the right knee. The patient has failed conservative treatment.  The clearance notes were reviewed.  After discussion with the patient it was felt that Total Knee Replacement was indicated. The procedure,  risks, and benefits of total knee arthroplasty were presented and reviewed. The risks including but not limited to aseptic loosening, infection, blood clots, vascular injury, stiffness, patella tracking problems complications among others were discussed. The patient acknowledged the explanation, agreed to proceed with the plan.  Denise Kelly 01/02/2014, 6:27 AM

## 2014-01-02 NOTE — Anesthesia Preprocedure Evaluation (Addendum)
Anesthesia Evaluation  Patient identified by MRN, date of birth, ID band Patient awake    Reviewed: Allergy & Precautions, H&P , NPO status , Patient's Chart, lab work & pertinent test results  History of Anesthesia Complications Negative for: history of anesthetic complications  Airway Mallampati: III TM Distance: >3 FB Neck ROM: Full    Dental  (+) Teeth Intact, Dental Advisory Given   Pulmonary former smoker,          Cardiovascular hypertension, + dysrhythmias (PVC's)     Neuro/Psych Anxiety negative neurological ROS     GI/Hepatic Neg liver ROS, GERD-  Medicated and Controlled,  Endo/Other  negative endocrine ROS  Renal/GU negative Renal ROS     Musculoskeletal  (+) Arthritis -, Osteoarthritis,    Abdominal   Peds  Hematology negative hematology ROS (+)   Anesthesia Other Findings   Reproductive/Obstetrics                          Anesthesia Physical Anesthesia Plan  ASA: II  Anesthesia Plan: General   Post-op Pain Management:    Induction: Intravenous  Airway Management Planned: LMA and Oral ETT  Additional Equipment:   Intra-op Plan:   Post-operative Plan: Extubation in OR  Informed Consent: I have reviewed the patients History and Physical, chart, labs and discussed the procedure including the risks, benefits and alternatives for the proposed anesthesia with the patient or authorized representative who has indicated his/her understanding and acceptance.     Plan Discussed with: CRNA, Anesthesiologist and Surgeon  Anesthesia Plan Comments:         Anesthesia Quick Evaluation

## 2014-01-02 NOTE — Anesthesia Postprocedure Evaluation (Signed)
  Anesthesia Post-op Note  Patient: Denise Kelly  Procedure(s) Performed: Procedure(s): TOTAL KNEE ARTHROPLASTY (Right)  Patient Location: PACU  Anesthesia Type:GA combined with regional for post-op pain  Level of Consciousness: awake, alert , oriented and patient cooperative  Airway and Oxygen Therapy: Patient Spontanous Breathing  Post-op Pain: moderate  Post-op Assessment: Post-op Vital signs reviewed, Patient's Cardiovascular Status Stable, Respiratory Function Stable, Patent Airway and No signs of Nausea or vomiting  Post-op Vital Signs: stable  Last Vitals:  Filed Vitals:   01/02/14 1115  BP: 157/83  Pulse:   Temp: 36.5 C  Resp:     Complications: No apparent anesthesia complications

## 2014-01-02 NOTE — Progress Notes (Signed)
Administered pain med to patient at 8:30pm. Patient stated, "I have to pee again." As Probation officer and oncoming RN just got her off the bedpan. Told patient this writer would put her on the bedpan. She said, "oh no, what are my other options." Listed BSC and walking to the BR. Stated she wanted to use BSC. Patient stood up with writer and walker, pivoted over to [facility] and slid down. Writer tried to help stand patient up and was unable to. Therefore, Probation officer slowly brought patient down to the floor while straightening her surgical knee out. Patient urinated all over the floor and gown. Called in 3 more RNs to assist as patient requested there be a man present. No s/sx of acute distress. ROM x 4 extremities. Did not hit her head as it was a witness and assisted to the floor fall. Patient called her daughter whom stated she was coming to be with her at the hospital. [place] notified. No new orders at this time. Will follow up in the morning.

## 2014-01-03 ENCOUNTER — Encounter (HOSPITAL_COMMUNITY): Payer: Self-pay | Admitting: Orthopedic Surgery

## 2014-01-03 ENCOUNTER — Inpatient Hospital Stay (HOSPITAL_COMMUNITY): Payer: Medicare Other

## 2014-01-03 LAB — BASIC METABOLIC PANEL
Anion gap: 13 (ref 5–15)
BUN: 11 mg/dL (ref 6–23)
CHLORIDE: 97 meq/L (ref 96–112)
CO2: 27 mEq/L (ref 19–32)
Calcium: 8.3 mg/dL — ABNORMAL LOW (ref 8.4–10.5)
Creatinine, Ser: 0.84 mg/dL (ref 0.50–1.10)
GFR, EST AFRICAN AMERICAN: 79 mL/min — AB (ref >90)
GFR, EST NON AFRICAN AMERICAN: 68 mL/min — AB (ref >90)
Glucose, Bld: 153 mg/dL — ABNORMAL HIGH (ref 70–99)
POTASSIUM: 3.7 meq/L (ref 3.7–5.3)
Sodium: 137 mEq/L (ref 137–147)

## 2014-01-03 LAB — CBC
HCT: 42.3 % (ref 36.0–46.0)
Hemoglobin: 13.8 g/dL (ref 12.0–15.0)
MCH: 32.8 pg (ref 26.0–34.0)
MCHC: 32.6 g/dL (ref 30.0–36.0)
MCV: 100.5 fL — ABNORMAL HIGH (ref 78.0–100.0)
PLATELETS: 166 10*3/uL (ref 150–400)
RBC: 4.21 MIL/uL (ref 3.87–5.11)
RDW: 12 % (ref 11.5–15.5)
WBC: 10.7 10*3/uL — AB (ref 4.0–10.5)

## 2014-01-03 MED ORDER — OXYCODONE HCL 5 MG PO TABS: 5.0000 mg | ORAL_TABLET | ORAL | Status: DC | PRN

## 2014-01-03 MED ORDER — METHOCARBAMOL 500 MG PO TABS: 500.0000 mg | ORAL_TABLET | Freq: Four times a day (QID) | ORAL | Status: DC | PRN

## 2014-01-03 MED ORDER — ENOXAPARIN SODIUM 40 MG/0.4ML ~~LOC~~ SOLN: 40.0000 mg | SUBCUTANEOUS | Status: DC

## 2014-01-03 NOTE — Evaluation (Signed)
Occupational Therapy Evaluation Patient Details Name: Denise Kelly MRN: [id number] DOB: April 17, 1940 Today's Date: 01/03/2014    History of Present Illness R TKA; WBAT; previous LTKA   Clinical Impression   Pt admitted with the above diagnoses and presents with below problem list. Pt will benefit from continued acute OT to address the below listed deficits and maximize independence with basic ADLs prior to d/c home with 24 hour help from daughter who is a paramedic per pt report. PTA pt was independent with ADLs. Pt currently at +2 min A for LB ADLs and functional mobility/transfers. Pt noted to have cognition difficulties as detailed below. Nursing alerted and meds suspected by nursing. Acute OT to continue to follow and update recommendations as needed. If pt does not progress to +1 for transfers and ADLs may need to consider SNF as pt only has +1 assistance at d/c.     Follow Up Recommendations  Supervision/Assistance - 24 hour;No OT follow up    Equipment Recommendations  None recommended by OT;Other (comment) (pt has 3n1)    Recommendations for Other Services       Precautions / Restrictions Precautions Precautions: Knee Precaution Comments: reviewed precautions Restrictions Weight Bearing Restrictions: Yes RLE Weight Bearing: Weight bearing as tolerated      Mobility Bed Mobility      General bed mobility comments: pt in recliner  Transfers Overall transfer level: Needs assistance Equipment used: Rolling walker (2 wheeled) Transfers: Sit to/from Stand Sit to Stand: Mod assist         General transfer comment: verbal and tactile cueing provided. pt easily distracted. unsuccesful first attempt with pt coming halfway up. Able to complete on second attempt with mod A for power up and balance.     Balance Overall balance assessment: Needs assistance Sitting-balance support: Bilateral upper extremity supported;Feet supported Sitting balance-Leahy Scale:  Fair Sitting balance - Comments: sat in recliner 3-4 minutes 2x before leaning back for short rest break   Standing balance support: Bilateral upper extremity supported;During functional activity Standing balance-Leahy Scale: Poor Standing balance comment: pt need rw and min A to maintain balance in standing                            ADL Overall ADL's : Needs assistance/impaired Eating/Feeding: Set up;Sitting   Grooming: Set up;Sitting   Upper Body Bathing: Set up;Sitting   Lower Body Bathing: Sit to/from stand;Maximal assistance   Upper Body Dressing : Set up;Sitting   Lower Body Dressing: Maximal assistance;Sit to/from stand   Toilet Transfer: Minimal assistance;+2 for physical assistance;Stand-pivot;BSC;RW   Toileting- Clothing Manipulation and Hygiene: Maximal assistance;Sit to/from stand   Tub/ Shower Transfer: Minimal assistance;+2 for physical assistance;Stand-pivot;3 in 1;Rolling walker   Functional mobility during ADLs: Minimal assistance;+2 for physical assistance;Rolling walker General ADL Comments: Pt with noted cognition impairments this session. Completed sit<>stand with diifculty coming to standing on first attempt. Able to complete sit>stand with mod A on second attempt. Per PT note pt is +2 min physical assistance for transfers and mobility. Discussed home setup and safety.       Vision                     Perception     Praxis      Pertinent Vitals/Pain Pain Assessment: 0-10 Pain Score: 9  Faces Pain Scale: Hurts even more Pain Location: Rt knee Pain Descriptors / Indicators: Grimacing;Aching Pain Intervention(s): Limited activity within  patient's tolerance;Monitored during session;Repositioned;Patient requesting pain meds-RN notified     Hand Dominance     Extremity/Trunk Assessment Upper Extremity Assessment Upper Extremity Assessment: Overall WFL for tasks assessed   Lower Extremity Assessment Lower Extremity Assessment:  Defer to PT evaluation       Communication Communication Communication: Expressive difficulties;Other (comment) (pt having difficulty at times with word jumbling)   Cognition Arousal/Alertness: Awake/alert Behavior During Therapy: WFL for tasks assessed/performed Overall Cognitive Status: No family/caregiver present to determine baseline cognitive functioning Area of Impairment: Attention;Following commands;Problem solving   Current Attention Level: Sustained Memory: Decreased short-term memory Following Commands: Follows one step commands inconsistently;Follows one step commands with increased time     Problem Solving: Slow processing;Decreased initiation;Difficulty sequencing;Requires verbal cues;Requires tactile cues General Comments: some difficulty noted with comprehension   General Comments       Exercises       Shoulder Instructions      Home Living Family/patient expects to be discharged to:: Private residence Living Arrangements: Alone Available Help at Discharge: Other (Comment);Family;Available 24 hours/day (daughter is a paramedic ) Type of Home: House Home Access: Level entry     Home Layout: One level     Bathroom Shower/Tub: Teacher, early years/pre: Standard Bathroom Accessibility: Yes How Accessible: Accessible via walker Home Equipment: [place] - 2 wheels;Bedside commode          Prior Functioning/Environment Level of Independence: Independent             OT Diagnosis: Acute pain;Generalized weakness   OT Problem List: Impaired balance (sitting and/or standing);Decreased knowledge of use of DME or AE;Decreased knowledge of precautions;Pain   OT Treatment/Interventions: Self-care/ADL training;DME and/or AE instruction;Therapeutic activities;Patient/family education;Balance training    OT Goals(Current goals can be found in the care plan section) Acute Rehab OT Goals Patient Stated Goal: not stated OT Goal Formulation: With  patient Time For Goal Achievement: 01/10/14 Potential to Achieve Goals: Good ADL Goals Pt Will Perform Lower Body Bathing: with min assist;with adaptive equipment;sit to/from stand Pt Will Perform Lower Body Dressing: with min assist;with adaptive equipment;sit to/from stand Pt Will Transfer to Toilet: with min guard assist;ambulating;bedside commode Pt Will Perform Toileting - Clothing Manipulation and hygiene: with min assist;sit to/from stand Pt Will Perform Tub/Shower Transfer: with min guard assist;ambulating;3 in 1;rolling walker  OT Frequency: Min 3X/week   Barriers to D/C:    pt is currentlt +2 phy assist with 1 person to assist at home       Co-evaluation              End of Session Equipment Utilized During Treatment: Gait belt;Rolling walker Nurse Communication: Other (comment) (cogn. - nrsg suspects meds; cogn test with nurse inroom)  Activity Tolerance: Patient limited by pain;Other (comment);Patient tolerated treatment well (Limited by cogntion.) Patient left: in chair;with call bell/phone within reach   Time: 1211-1256 OT Time Calculation (min): 45 min Charges:  OT General Charges $OT Visit: 1 Procedure OT Evaluation $Initial OT Evaluation Tier I: 1 Procedure OT Treatments $Self Care/Home Management : 23-37 mins G-Codes:    Hortencia Pilar 2014-01-31, 3:18 PM

## 2014-01-03 NOTE — Discharge Instructions (Signed)
Diet: As you were doing prior to hospitalization   Activity:  Increase activity slowly as tolerated                  No lifting or driving for 6 weeks  Shower:  May shower without a dressing once there is no drainage from your wound.                 Do NOT wash over the wound.                 Dressing:  You may change your dressing on Wednesday                    Then change the dressing daily with sterile 4"x4"s gauze dressing                     And TED hose for knees.  Weight Bearing:  Weight bearing as tolerated as taught in physical therapy.  Use a                                walker or Crutches as instructed.  To prevent constipation: you may use a stool softener such as -               Colace ( over the counter) 100 mg by mouth twice a day                Drink plenty of fluids ( prune juice may be helpful) and high fiber foods                Miralax ( over the counter) for constipation as needed.    Precautions:  If you experience chest pain or shortness of breath - call 911 immediately               For transfer to the hospital emergency department!!               If you develop a fever greater that 101 F, purulent drainage from wound,                             increased redness or drainage from wound, or calf pain -- Call the office.  Follow- Up Appointment:  Please call for an appointment to be seen on 01/17/14                                              [facility] office:  [phone]            [address] [place], [place] [address]

## 2014-01-03 NOTE — Plan of Care (Signed)
Problem: Consults Goal: Diagnosis- Total Joint Replacement Outcome: Completed/Met Date Met:  01/03/14 Primary Total Knee Right

## 2014-01-03 NOTE — Progress Notes (Signed)
SPORTS MEDICINE AND JOINT REPLACEMENT  Lara Mulch, MD   Carlynn Spry, PA-C [place], [place], [place]  [address]                             [phone]   PROGRESS NOTE  Subjective:  negative for Chest Pain  negative for Shortness of Breath  negative for Nausea/Vomiting   negative for Calf Pain  negative for Bowel Movement   Tolerating Diet: yes         Patient reports pain as 5 on 0-10 scale.    Objective: Vital signs in last 24 hours:   Patient Vitals for the past 24 hrs:  BP Temp Pulse Resp SpO2  01/03/14 0450 131/68 mmHg 99.9 F (37.7 C) 101 18 90 %  01/03/14 0400 - - - 18 90 %  01/03/14 0150 151/80 mmHg 98.6 F (37 C) 107 18 92 %  01/03/14 0000 - - - 18 92 %  01/02/14 2111 147/92 mmHg 97.9 F (36.6 C) 109 18 92 %  01/02/14 2000 134/80 mmHg 97.9 F (36.6 C) 94 18 92 %  01/02/14 1600 - - - 18 -  01/02/14 1330 138/80 mmHg 99.5 F (37.5 C) - 16 95 %  01/02/14 1327 - - - - 96 %  01/02/14 1300 146/82 mmHg 97.7 F (36.5 C) 90 14 96 %  01/02/14 1245 148/82 mmHg - 87 14 95 %  01/02/14 1230 149/87 mmHg - 92 20 96 %  01/02/14 1215 130/80 mmHg - 86 11 91 %  01/02/14 1200 152/79 mmHg - 81 11 95 %  01/02/14 1145 152/99 mmHg - 90 14 95 %  01/02/14 1130 161/83 mmHg - 91 18 98 %  01/02/14 1115 157/83 mmHg 97.7 F (36.5 C) - - -    @flow {1959:LAST@   Intake/Output from previous day:   10/19 0701 - 10/20 0700 In: 3198.8 [P.O.:660; I.V.:1338.8] Out: 545 [Drains:470]   Intake/Output this shift:       Intake/Output     10/19 0701 - 10/20 0700 10/20 0701 - 10/21 0700   P.O. 660    I.V. (mL/kg) 1338.8 (13.5)    Other 50    IV Piggyback 1150    Total Intake(mL/kg) 3198.8 (32.2)    Drains 470    Blood 75    Total Output 545     Net +2653.8          Urine Occurrence 3 x       LABORATORY DATA:  Recent Labs  12/29/13 1600 01/02/14 1425 01/03/14 0540  WBC 7.0 6.4 10.7*  HGB 15.0 14.2 13.8  HCT 44.8 41.5 42.3  PLT 148* 139* 166    Recent  Labs  12/29/13 1600 01/02/14 1425 01/03/14 0540  NA 141  --  137  K 4.0  --  3.7  CL 100  --  97  CO2 28  --  27  BUN 11  --  11  CREATININE 0.78 0.73 0.84  GLUCOSE 101*  --  153*  CALCIUM 9.0  --  8.3*   Lab Results  Component Value Date   INR 1.16 12/29/2013   INR 0.97 09/20/2009    Examination:  General appearance: alert, cooperative and no distress Extremities: extremities normal, atraumatic, no cyanosis or edema and Homans sign is negative, no sign of DVT  Wound Exam: clean, dry, intact   Drainage:  Scant/small amount Serosanguinous exudate  Motor Exam: EHL and  FHL Intact  Sensory Exam: Deep Peroneal normal   Assessment:    1 Day Post-Op  Procedure(s) (LRB): TOTAL KNEE ARTHROPLASTY (Right)  ADDITIONAL DIAGNOSIS:  Active Problems:   S/P total knee arthroplasty  Acute Blood Loss Anemia   Plan: Physical Therapy as ordered Weight Bearing as Tolerated (WBAT)  DVT Prophylaxis:  Lovenox  DISCHARGE PLAN: Home  DISCHARGE NEEDS: HHPT, CPM, Walker and 3-in-1 comode seat         Denise Kelly 01/03/2014, 9:43 AM

## 2014-01-03 NOTE — Progress Notes (Signed)
Physical Therapy Treatment Patient Details Name: Denise Kelly MRN: [id number] DOB: [birth year] Today's Date: 01/03/2014    History of Present Illness R TKA; WBAT; previous LTKA    PT Comments    Making slow progress with mobility and amb; R knee tending to buckle, making pt a high fall risk -- I hesitate to ask, but is it worth considering a knee immobilizer for R knee stability in stance?  Follow Up Recommendations  Home health PT;Supervision/Assistance - 24 hour     Equipment Recommendations  Rolling walker with 5" wheels;3in1 (PT)    Recommendations for Other Services OT consult     Precautions / Restrictions Precautions Precautions: Knee Precaution Comments: Pt educated in no pillow under knee for optimal extension range Restrictions RLE Weight Bearing: Weight bearing as tolerated    Mobility  Bed Mobility Overal bed mobility: Needs Assistance Bed Mobility: Supine to Sit     Supine to sit: Min assist     General bed mobility comments: Cues for technqiue; Overall moving well, assist for RLE coming off and on the bed  Transfers Overall transfer level: Needs assistance Equipment used: Rolling walker (2 wheeled) Transfers: Sit to/from Stand Sit to Stand: +2 safety/equipment;Mod assist         General transfer comment: Cues for hand placement, safety and technique; Close guard of R knee and needed support to prevent buckling at initial stand; Very slow to respond to cues related to preparing to sit; Requires incr time  Ambulation/Gait Ambulation/Gait assistance: +2 physical assistance;Mod assist Ambulation Distance (Feet): 15 Feet (over 2 bouts) Assistive device: Rolling walker (2 wheeled) Gait Pattern/deviations: Step-to pattern;Step-through pattern;Decreased stance time - right     General Gait Details: Requires step-by-step cues for sequence and close guard and even R knee blocking to prevent buckling in R stance; Physical assist to advance RW and second  person key for safety with chair pushed behind as pt's R knee buckled approx 50% of the time in stance   Stairs            Wheelchair Mobility    Modified Rankin (Stroke Patients Only)       Balance Overall balance assessment: Needs assistance   Sitting balance-Leahy Scale: Good     Standing balance support: Bilateral upper extremity supported Standing balance-Leahy Scale: Poor                      Cognition Arousal/Alertness: Awake/alert Behavior During Therapy: WFL for tasks assessed/performed Overall Cognitive Status: Impaired/Different from baseline Area of Impairment: Attention;Following commands;Problem solving   Current Attention Level: Sustained (and very easily distractible) Memory: Decreased short-term memory Following Commands: Follows one step commands inconsistently;Follows one step commands with increased time     Problem Solving: Slow processing;Decreased initiation;Difficulty sequencing;Requires verbal cues;Requires tactile cues General Comments: Generally slower to respond than yesterday    Exercises     General Comments        Pertinent Vitals/Pain Pain Assessment: Faces Faces Pain Scale: Hurts even more Pain Location: R knee Pain Descriptors / Indicators: Grimacing Pain Intervention(s): Monitored during session;Repositioned (RN understandably hesitant to give more pain meds)    Home Living                      Prior Function            PT Goals (current goals can now be found in the care plan section) Acute Rehab PT Goals Patient Stated Goal: agreeable to  amb PT Goal Formulation: With patient Time For Goal Achievement: 01/09/14 Potential to Achieve Goals: Good Progress towards PT goals: Progressing toward goals    Frequency  7X/week    PT Plan Current plan remains appropriate    Co-evaluation             End of Session Equipment Utilized During Treatment: Gait belt Activity Tolerance: Patient  tolerated treatment well Patient left: in chair;with call bell/phone within reach;with chair alarm set     Time: 1115-1145 PT Time Calculation (min): 30 min  Charges:  $Gait Training: 8-22 mins $Therapeutic Exercise: 8-22 mins $Therapeutic Activity: 8-22 mins                    G Codes:      Denise Kelly 01/03/2014, 12:25 PM  Denise Kelly, Denise Kelly [place] Pager [phone] Office [phone]

## 2014-01-03 NOTE — Progress Notes (Signed)
Physical Therapy Treatment Patient Details Name: Denise Kelly MRN: [id number] DOB: [birth year] Today's Date: 01/03/2014    History of Present Illness R TKA; WBAT; previous LTKA    PT Comments    Session focused on therex for incr knee control; Needs continued work on knee extension strength to prevent R knee buckling in standing;  Limited session as pt had to go to radiology; Next session to focus more on ambulation, activity tolerance    Follow Up Recommendations  Home health PT;Supervision/Assistance - 24 hour     Equipment Recommendations  Rolling walker with 5" wheels;3in1 (PT)    Recommendations for Other Services OT consult     Precautions / Restrictions Precautions Precautions: Knee Precaution Comments: Pt educated in no pillow under knee for optimal extension range Restrictions RLE Weight Bearing: Weight bearing as tolerated    Mobility  Bed Mobility                  Transfers                    Ambulation/Gait                 Stairs            Wheelchair Mobility    Modified Rankin (Stroke Patients Only)       Balance                                    Cognition Arousal/Alertness: Lethargic;Suspect due to medications Behavior During Therapy: [facility] for tasks assessed/performed Overall Cognitive Status: Impaired/Different from baseline Area of Impairment: Attention;Following commands;Problem solving   Current Attention Level: Sustained (and very easily distractible) Memory: Decreased short-term memory Following Commands: Follows one step commands inconsistently;Follows one step commands with increased time     Problem Solving: Slow processing;Decreased initiation;Difficulty sequencing;Requires verbal cues;Requires tactile cues General Comments: Generally slower to respond than yesterday    Exercises Total Joint Exercises Ankle Circles/Pumps: AROM;Both;20 reps (cues for full dorsiflexion  range) Quad Sets: AROM;Right;Both;10 reps;Other (comment) (quad activation present, but minimal) Heel Slides: AAROM;Right;10 reps Straight Leg Raises: AAROM;Right;10 reps (quad lag present) Goniometric ROM: 0-90 PROM R knee    General Comments        Pertinent Vitals/Pain Pain Assessment: Faces Faces Pain Scale: Hurts even more Pain Location: R knee at end range flexion Pain Descriptors / Indicators: Grimacing Pain Intervention(s): Limited activity within patient's tolerance;Repositioned    Home Living                      Prior Function            PT Goals (current goals can now be found in the care plan section) Acute Rehab PT Goals Patient Stated Goal: Didn't really state this am, but agreeable to therex PT Goal Formulation: With patient Time For Goal Achievement: 01/09/14 Potential to Achieve Goals: Good Progress towards PT goals: Progressing toward goals (slowly)    Frequency  7X/week    PT Plan Current plan remains appropriate    Co-evaluation             End of Session   Activity Tolerance: Patient tolerated treatment well Patient left: in bed;Other (comment) (preparing to go  to x-ray)     Time: 5400-8676 PT Time Calculation (min): 17 min  Charges:  $Therapeutic Exercise: 8-22 mins  G Codes:      Roney Marion Hamff 01/03/2014, 12:09 PM  Roney Marion, [place] Pager [phone] Office [phone]'

## 2014-01-03 NOTE — Op Note (Signed)
TOTAL KNEE REPLACEMENT OPERATIVE NOTE:  01/02/2014  7:21 AM  PATIENT:  Denise Kelly  73 y.o. female  PRE-OPERATIVE DIAGNOSIS:  osteoarthritis right knee  POST-OPERATIVE DIAGNOSIS:  osteoarthritis right knee  PROCEDURE:  Procedure(s): TOTAL KNEE ARTHROPLASTY  SURGEON:  Surgeon(s): [name], MD  PHYSICIAN ASSISTANT: Carlynn Spry, [facility]  ANESTHESIA:   general  DRAINS: Hemovac  SPECIMEN: None  COUNTS:  Correct  TOURNIQUET:   Total Tourniquet Time Documented: Thigh (Right) - 62 minutes Total: Thigh (Right) - 62 minutes   DICTATION:  Indication for procedure:    The patient is a 73 y.o. female who has failed conservative treatment for osteoarthritis right knee.  Informed consent was obtained prior to anesthesia. The risks versus benefits of the operation were explain and in a way the patient can, and did, understand.   On the implant demand matching protocol, this patient scored 10.  Therefore, this patient was not receive a polyethylene insert with vitamin E which is a high demand implant.  Description of procedure:     The patient was taken to the operating room and placed under anesthesia.  The patient was positioned in the usual fashion taking care that all body parts were adequately padded and/or protected.  I foley catheter was not placed.  A tourniquet was applied and the leg prepped and draped in the usual sterile fashion.  The extremity was exsanguinated with the esmarch and tourniquet inflated to 350 mmHg.  Pre-operative range of motion was normal.  The knee was in 5 degree of mild valgus.  A midline incision approximately 6-7 inches long was made with a #10 blade.  A new blade was used to make a parapatellar arthrotomy going 2-3 cm into the quadriceps tendon, over the patella, and alongside the medial aspect of the patellar tendon.  A synovectomy was then performed with the #10 blade and forceps. I then elevated the deep MCL off the medial tibial metaphysis  subperiosteally around to the semimembranosus attachment.    I everted the patella and used calipers to measure patellar thickness.  I used the reamer to ream down to appropriate thickness to recreate the native thickness.  I then removed excess bone with the rongeur and sagittal saw.  I used the appropriately sized template and drilled the three lug holes.  I then put the trial in place and measured the thickness with the calipers to ensure recreation of the native thickness.  The trial was then removed and the patella subluxed and the knee brought into flexion.  A homan retractor was place to retract and protect the patella and lateral structures.  A Z-retractor was place medially to protect the medial structures.  The extra-medullary alignment system was used to make cut the tibial articular surface perpendicular to the anamotic axis of the tibia and in 3 degrees of posterior slope.  The cut surface and alignment jig was removed.  I then used the intramedullary alignment guide to make a 4 valgus cut on the distal femur.  I then marked out the epicondylar axis on the distal femur.  The posterior condylar axis measured 5 degrees.  I then used the anterior referencing sizer and measured the femur to be a size 8.  The 4-In-1 cutting block was screwed into place in external rotation matching the posterior condylar angle, making our cuts perpendicular to the epicondylar axis.  Anterior, posterior and chamfer cuts were made with the sagittal saw.  The cutting block and cut pieces were removed.  A  lamina spreader was placed in 90 degrees of flexion.  The ACL, PCL, menisci, and posterior condylar osteophytes were removed.  A 10 mm spacer blocked was found to offer good flexion and extension gap balance after minimal in degree releasing.   The scoop retractor was then placed and the femoral finishing block was pinned in place.  The small sagittal saw was used as well as the lug drill to finish the femur.  The block  and cut surfaces were removed and the medullary canal hole filled with autograft bone from the cut pieces.  The tibia was delivered forward in deep flexion and external rotation.  A size E tray was selected and pinned into place centered on the medial 1/3 of the tibial tubercle.  The reamer and keel was used to prepare the tibia through the tray.    I then trialed with the size 8 femur, size E tibia, a 10 mm insert and the 35 patella.  I had excellent flexion/extension gap balance, excellent patella tracking.  Flexion was full and beyond 120 degrees; extension was zero.  These components were chosen and the staff opened them to me on the back table while the knee was lavaged copiously and the cement mixed.  The soft tissue was infiltrated with 60cc of exparel 1.3% through a 21 gauge needle.  I cemented in the components and removed all excess cement.  The polyethylene tibial component was snapped into place and the knee placed in extension while cement was hardening.  The capsule was infilltrated with 30cc of .25% Marcaine with epinephrine.  A hemovac was place in the joint exiting superolaterally.  A pain pump was place superomedially superficial to the arthrotomy.  Once the cement was hard, the tourniquet was let down.  Hemostasis was obtained.  The arthrotomy was closed with figure-8 #1 vicryl sutures.  The deep soft tissues were closed with #0 vicryls and the subcuticular layer closed with a running #2-0 vicryl.  The skin was reapproximated and closed with skin staples.  The wound was dressed with xeroform, 4 x4's, 2 ABD sponges, a single layer of webril and a TED stocking.   The patient was then awakened, extubated, and taken to the recovery room in stable condition.  BLOOD LOSS:  300cc DRAINS: 1 hemovac, 1 pain catheter COMPLICATIONS:  None.  PLAN OF CARE: Admit to inpatient   PATIENT DISPOSITION:  PACU - hemodynamically stable.   Delay start of Pharmacological VTE agent (>24hrs) due to  surgical blood loss or risk of bleeding:  not applicable  Please fax a copy of this op note to my office at [phone] (please only include page 1 and 2 of the Case Information op note)

## 2014-01-03 NOTE — Care Management Note (Signed)
CARE MANAGEMENT NOTE 01/03/2014  Patient:  Denise Kelly, Denise Kelly   Account Number:  [id number]  Date Initiated:  01/03/2014  Documentation initiated by:  Ricki Miller  Subjective/Objective Assessment:   73 yr old female admitted with right knee osteoarthritis, patient s/p right total knee arthroplasty.     Action/Plan:   Patient was preoperatively setup with [facility].  Case manager will continue to monitor.   Anticipated DC Date:  01/05/2014   Anticipated DC Plan:  [place]  CM consult      [facility] Choice  [place]   Choice offered to / List presented to:             [place]   Status of service:  In process, will continue to follow  Per UR Regulation:  Reviewed for med. necessity/level of care/duration of stay

## 2014-01-03 NOTE — Progress Notes (Addendum)
01/02/14 2100  What Happened  Was fall witnessed? Yes  Who witnessed fall? Josefine Class, RN  Patients activity before fall bathroom-assisted  Point of contact buttocks  Was patient injured? No  Follow Up  MD notified yes  Time MD notified 2045  Family notified Yes-comment  Time family notified 2045 (pt called daughter )  Additional tests No (none ordered)  Simple treatment Other (comment) (assisted back to bed, repositioned)  Progress note created (see row info) Yes  Adult Fall Risk Assessment  Risk Factor Category (scoring not indicated) Fall has occurred during this admission (document High fall risk);High fall risk per protocol (document High fall risk)  Patient's Fall Risk High Fall Risk (>13 points)  Adult Fall Risk Interventions  Required Bundle Interventions *See Row Information* High fall risk - low, moderate, and high requirements implemented  Additional Interventions Bed alarm not indicated with the bundle;Fall risk signage;Individualized elimination schedule;PT/OT need assessed if change in mobility from baseline;Secure all tubes/drains;Use of appropriate toileting equipment (bedpan, BSC, etc.)  PCA/Epidural/Spinal Assessment  Respiratory Pattern Regular;Unlabored  Neurological  Neuro (WDL) WDL  Level of Consciousness Alert  Orientation Level Oriented X4  Musculoskeletal  Musculoskeletal (WDL) X  Assistive Device BSC;Front wheel walker  Generalized Weakness Yes  Weight Bearing Restrictions Yes  RLE Weight Bearing WBAT  Musculoskeletal Details  Right Knee Limited movement;Weakness;Surgery;Ortho/Supportive Device  Right Knee Ortho/Supportive Device CPM  Integumentary  Integumentary (WDL) X  Skin Color Appropriate for ethnicity  Skin Integrity Ecchymosis  Ecchymosis Location Arm  Ecchymosis Location Orientation Right (at armpit)  CPM Right Knee  CPM Right Knee Off

## 2014-01-04 LAB — CBC
HEMATOCRIT: 35.9 % — AB (ref 36.0–46.0)
Hemoglobin: 11.9 g/dL — ABNORMAL LOW (ref 12.0–15.0)
MCH: 33 pg (ref 26.0–34.0)
MCHC: 33.1 g/dL (ref 30.0–36.0)
MCV: 99.4 fL (ref 78.0–100.0)
PLATELETS: 133 10*3/uL — AB (ref 150–400)
RBC: 3.61 MIL/uL — ABNORMAL LOW (ref 3.87–5.11)
RDW: 11.9 % (ref 11.5–15.5)
WBC: 8.8 10*3/uL (ref 4.0–10.5)

## 2014-01-04 LAB — URINALYSIS, ROUTINE W REFLEX MICROSCOPIC
Bilirubin Urine: NEGATIVE
GLUCOSE, UA: NEGATIVE mg/dL
Ketones, ur: NEGATIVE mg/dL
LEUKOCYTES UA: NEGATIVE
Nitrite: POSITIVE — AB
PROTEIN: NEGATIVE mg/dL
Specific Gravity, Urine: 1.017 (ref 1.005–1.030)
Urobilinogen, UA: 0.2 mg/dL (ref 0.0–1.0)
pH: 5.5 (ref 5.0–8.0)

## 2014-01-04 LAB — URINE MICROSCOPIC-ADD ON

## 2014-01-04 MED ORDER — TRAMADOL HCL 50 MG PO TABS
50.0000 mg | ORAL_TABLET | ORAL | Status: DC | PRN
  Administered 2014-01-04 – 2014-01-06 (×3): 100 mg via ORAL
  Filled 2014-01-04 (×4): qty 2

## 2014-01-04 NOTE — Progress Notes (Signed)
Physical Therapy Treatment Patient Details Name: Denise Kelly MRN: [id number] DOB: [birth year] Today's Date: 01/04/2014    History of Present Illness R TKA; WBAT; previous LTKA    PT Comments    More confused today, very inconsistently following commands, and then quite slowly; continues to require external support/blocking for R knee stability in standing -- will use knee immobilizer next session;  Not safe to dc home today; Will continue to monitor -- hopeful that postop confusion will resolve soon; If it doesn't we must consider SNF  Follow Up Recommendations  Home health PT;Supervision/Assistance - 24 hour     Equipment Recommendations  Rolling walker with 5" wheels;3in1 (PT)    Recommendations for Other Services OT consult     Precautions / Restrictions Precautions Precautions: Knee Precaution Comments: Pt educated in no pillow under knee for optimal extension range Required Braces or Orthoses: Knee Immobilizer - Right Knee Immobilizer - Right: On when out of bed or walking Restrictions RLE Weight Bearing: Weight bearing as tolerated    Mobility  Bed Mobility Overal bed mobility: Needs Assistance Bed Mobility: Supine to Sit     Supine to sit: Mod assist     General bed mobility comments: Cues for technqiue; Mod assist today to come to upright sitting and encourage pt to continue sitting up (as she attempted to lie down), assist for RLE coming off and on the bed  Transfers Overall transfer level: Needs assistance Equipment used: Rolling walker (2 wheeled) Transfers: Sit to/from Stand Sit to Stand: +2 safety/equipment;Mod assist         General transfer comment: Cues for hand placement, safety and technique; Close guard of R knee and needed support to prevent buckling at initial stand; Very slow to respond to cues related to preparing to sit; Requires incr time; Heavy mod assist for rise  Ambulation/Gait Ambulation/Gait assistance: +2 physical  assistance;Mod assist Ambulation Distance (Feet):  (4 pivotal steps bed to [facility]) Assistive device: Rolling walker (2 wheeled) Gait Pattern/deviations: Shuffle     General Gait Details: Requires step-by-step cues for sequence and close guard and even R knee blocking to prevent buckling in R stance; Physical assist to advance RW and second person key for safety with chair pushed behind as pt's R knee buckled approx 50% of the time in stance   Stairs            Wheelchair Mobility    Modified Rankin (Stroke Patients Only)       Balance     Sitting balance-Leahy Scale: Fair Sitting balance - Comments: made a few attempts to lay back down once sitting up     Standing balance-Leahy Scale: Poor Standing balance comment: Requires bil UE support and assist for balance                    Cognition Arousal/Alertness: Awake/alert Behavior During Therapy: WFL for tasks assessed/performed Overall Cognitive Status: Impaired/Different from baseline Area of Impairment: Attention;Following commands;Problem solving;Safety/judgement   Current Attention Level: Sustained (and very easily distractible) Memory: Decreased short-term memory Following Commands: Follows one step commands inconsistently;Follows one step commands with increased time Safety/Judgement: Decreased awareness of safety;Decreased awareness of deficits (insisting she is going home today)   Problem Solving: Slow processing;Decreased initiation;Difficulty sequencing;Requires verbal cues;Requires tactile cues General Comments: continued difficulty with comprehension    Exercises      General Comments        Pertinent Vitals/Pain Pain Assessment: Faces Faces Pain Scale: Hurts even more Pain Location:  R knee Pain Descriptors / Indicators: Grimacing Pain Intervention(s): Monitored during session;Repositioned    Home Living                      Prior Function            PT Goals (current goals  can now be found in the care plan section) Acute Rehab PT Goals Patient Stated Goal: agreeable to amb PT Goal Formulation: With patient Time For Goal Achievement: 01/09/14 Potential to Achieve Goals: Good Progress towards PT goals: Not progressing toward goals - comment (more confused today; refusing to walk further)    Frequency  7X/week    PT Plan Current plan remains appropriate    Co-evaluation             End of Session Equipment Utilized During Treatment: Gait belt Activity Tolerance: Patient tolerated treatment well Patient left: in chair;with call bell/phone within reach;with chair alarm set     Time: 7494-4967 PT Time Calculation (min): 32 min  Charges:  $Therapeutic Activity: 23-37 mins                    G Codes:      Quin Hoop 01/04/2014, 10:18 AM  Roney Marion, [place] Pager [phone] Office [phone]

## 2014-01-04 NOTE — Clinical Social Work Placement (Addendum)
Clinical Social Work Department CLINICAL SOCIAL WORK PLACEMENT NOTE 01/04/2014  Patient:  Denise Kelly, Denise Kelly  Account Number:  [id number] [place] date:  01/02/2014  Clinical Social Worker:  Wylene Men  Date/time:  01/04/2014 11:08 AM  Clinical Social Work is seeking post-discharge placement for this patient at the following level of care:   SKILLED NURSING   (*CSW will update this form in Epic as items are completed)   01/04/2014  Patient/family provided with [place] Department of Clinical Social Work's list of facilities offering this level of care within the geographic area requested by the patient (or if unable, by the patient's family).  01/04/2014  Patient/family informed of their freedom to choose among providers that offer the needed level of care, that participate in Medicare, Medicaid or managed care program needed by the patient, have an available bed and are willing to accept the patient.  01/04/2014  Patient/family informed of [facility]' ownership interest in [facility], as well as of the fact that they are under no obligation to receive care at this facility.  PASARR submitted to EDS on 01/04/2014 PASARR number received on 01/04/2014  FL2 transmitted to all facilities in geographic area requested by pt/family on  01/04/2014 FL2 transmitted to all facilities within larger geographic area on   Patient informed that his/her managed care company has contracts with or will negotiate with  certain facilities, including the following:     Patient/family informed of bed offers received:  01/05/2014 Patient chooses bed at [facility] Lubertha Sayres, [place]) Physician recommends and patient chooses bed at    Patient to be transferred to  [facility] on  01/06/2014 Lubertha Sayres, [facility]) Patient to be transferred to facility by Pt's daughter to transport Lubertha Sayres, [place]) Patient and family notified of transfer on 01/06/2014 Lubertha Sayres, Latanya Presser) Name of family member notified:  Dalia Heading, pt's daughter [place])  The following physician request were entered in Epic:   Additional Comments:  Nonnie Done, [place] [phone]  Psychiatric & Orthopedics (5N 1-16) Clinical Social Worker   Lubertha Sayres, [place] [phone]) Licensed Clinical Social Worker Orthopedics [phone]) and Surgical [phone])

## 2014-01-04 NOTE — Clinical Social Work Psychosocial (Signed)
Clinical Social Work Department BRIEF PSYCHOSOCIAL ASSESSMENT 01/04/2014  Patient:  Denise Kelly, Denise Kelly     Account Number:  [id number]     Admit date:  01/02/2014  Clinical Social Worker:  Wylene Men  Date/Time:  01/04/2014 10:53 AM  Referred by:  Physician  Date Referred:  01/04/2014 Referred for  SNF Placement  Psychosocial assessment   Other Referral:   none   Interview type:  Other - See comment Other interview type:   Baldo Ash, daughter, pt is alert and oriented to self only    PSYCHOSOCIAL DATA Living Status:  ALONE Admitted from facility:   Level of care:   Primary support name:  Baldo Ash Primary support relationship to patient:  CHILD, ADULT Degree of support available:   strong    CURRENT CONCERNS Current Concerns  Post-Acute Placement   Other Concerns:   None    SOCIAL WORK ASSESSMENT / PLAN CSW assessed pt at bedside. Pt daughter remained in the room throughout the assessment. Pt is alert and oriented to person only. CSW completed assessment with daughter, Baldo Ash. Baldo Ash states that pt is from home alone prior to hospital admission.  Baldo Ash states that she is on FMLA to take care of mom and will be able to care for pt fulltime for 2-3 weeks after discharge, but unsure that she is able to care for pt appropriately seeing as PT reports pt is a 2+ assist with transfers and ambulations. Pt daughter states she feels that the patient may benefit more from SNF placement. Daughter and patient has viewed [facility] prior to this hospital admission and has chosen to go to [facility] once medically dc if pt daughter decides against taking her home.  CSW will continue to follow for dispositioning needs.  Pt and daughter are both hopeful regarding patient's prognosis and ability to recover and once again live intermittently independent.   Assessment/plan status:  Psychosocial Support/Ongoing Assessment of Needs Other assessment/ plan:   FL2  PASARR    Information/referral to community resources:   SNF/STR    PATIENT'S/FAMILY'S RESPONSE TO PLAN OF CARE: Pt and daughter agreeable to SNF search of [facility]. Pt and daughter's first choice is [facility] where they have viewed and spoken to admission's coordinator prior to this hospital admission.       Nonnie Done, [place] [phone]  Psychiatric & Orthopedics (5N 1-16) Clinical Social Worker

## 2014-01-04 NOTE — Progress Notes (Signed)
Orthopedic Tech Progress Note Patient Details:  Denise Kelly [birth year] [id number] Applied Velcro knee immobilizer to RLE.  Pulses, sensation, motion intact before and after application.  Capillary refill less than 2 seconds before and after application. Ortho Devices Type of Ortho Device: Knee Immobilizer Ortho Device/Splint Location: RLE Ortho Device/Splint Interventions: Application   Darrol Poke 01/04/2014, 12:25 PM

## 2014-01-04 NOTE — Progress Notes (Signed)
SPORTS MEDICINE AND JOINT REPLACEMENT  Lara Mulch, MD   Carlynn Spry, PA-C [place], [place], [place]  [address]                             [phone]   PROGRESS NOTE  Subjective:  negative for Chest Pain  negative for Shortness of Breath  negative for Nausea/Vomiting   negative for Calf Pain  negative for Bowel Movement   Tolerating Diet: yes         Patient reports pain as 5 on 0-10 scale.    Objective: Vital signs in last 24 hours:   Patient Vitals for the past 24 hrs:  BP Temp Temp src Pulse Resp SpO2 Height Weight  01/04/14 0746 - - - - 18 - - -  01/04/14 0500 142/74 mmHg 98.3 F (36.8 C) Oral 107 16 92 % - -  01/04/14 0400 - - - - 17 - - -  01/04/14 0100 - - - - - - 5' 7.99" (1.727 m) 99.701 kg (219 lb 12.8 oz)  01/04/14 0000 - - - - 17 - - -  01/03/14 2004 152/71 mmHg 98.4 F (36.9 C) Oral 107 16 94 % - -  01/03/14 2000 - - - - 17 - - -  01/03/14 1400 139/68 mmHg 98.4 F (36.9 C) - 90 16 92 % - -  01/03/14 1200 - - - - 18 - - -    @flow {1959:LAST@   Intake/Output from previous day:   10/20 0701 - 10/21 0700 In: 2575.7 [P.O.:1200; I.V.:1375.7] Out: -    Intake/Output this shift:   10/21 0701 - 10/21 1900 In: -  Out: 520 [Urine:520]   Intake/Output     10/20 0701 - 10/21 0700 10/21 0701 - 10/22 0700   P.O. 1200    I.V. (mL/kg) 1375.7 (13.8)    Other     IV Piggyback 0    Total Intake(mL/kg) 2575.7 (25.8)    Urine (mL/kg/hr)  520 (1.4)   Drains     Blood     Total Output   520   Net +2575.7 -520        Urine Occurrence 6 x       LABORATORY DATA:  Recent Labs  12/29/13 1600 01/02/14 1425 01/03/14 0540 01/04/14 0420  WBC 7.0 6.4 10.7* 8.8  HGB 15.0 14.2 13.8 11.9*  HCT 44.8 41.5 42.3 35.9*  PLT 148* 139* 166 133*    Recent Labs  12/29/13 1600 01/02/14 1425 01/03/14 0540  NA 141  --  137  K 4.0  --  3.7  CL 100  --  97  CO2 28  --  27  BUN 11  --  11  CREATININE 0.78 0.73 0.84  GLUCOSE 101*  --  153*  CALCIUM  9.0  --  8.3*   Lab Results  Component Value Date   INR 1.16 12/29/2013   INR 0.97 09/20/2009    Examination:  General appearance: alert, cooperative and confused Extremities: Homans sign is negative, no sign of DVT  Wound Exam: clean, dry, intact   Drainage:  None: wound tissue dry  Motor Exam: EHL and FHL Intact  Sensory Exam: Deep Peroneal normal   Assessment:    2 Days Post-Op  Procedure(s) (LRB): TOTAL KNEE ARTHROPLASTY (Right)  ADDITIONAL DIAGNOSIS:  Active Problems:   S/P total knee arthroplasty  Acute Blood Loss Anemia   Plan: Physical Therapy as  ordered Weight Bearing as Tolerated (WBAT)  DVT Prophylaxis:  Lovenox  DISCHARGE PLAN: Home vs SNF  DISCHARGE NEEDS: HHPT, CPM, Walker and 3-in-1 comode seat         Denise Kelly 01/04/2014, 10:47 AM

## 2014-01-04 NOTE — Progress Notes (Signed)
Occupational Therapy Treatment Patient Details Name: Denise Kelly MRN: [id number] DOB: 1940/12/09 Today's Date: 01/04/2014    History of present illness R TKA; WBAT; previous LTKA   OT comments  Pt seen for acute OT treatment session. Pt indicated willingness to attempt supine > EOB despite 9/10 pain. Ultimately pt declined after attempt. Pt unable to manage RLE movement for bed mobility. Per PT note pt still at +2 assist level for transfers. Pt still at +2 physical assist level for LB ADLs and OOB mobility/transfers. Discussed SNF recommendation with patient and daughter who indicated openness to SNF. Updating d/c recommendation to SNF for further rehab prior to returning home.   Follow Up Recommendations  SNF    Equipment Recommendations  Other (comment) (defer to next venue)    Recommendations for Other Services      Precautions / Restrictions Precautions Precautions: Knee Precaution Comments: educated on no ice under knee Required Braces or Orthoses: Knee Immobilizer - Right Knee Immobilizer - Right: On when out of bed or walking Restrictions Weight Bearing Restrictions: Yes RLE Weight Bearing: Weight bearing as tolerated       Mobility Bed Mobility Overal bed mobility: Needs Assistance Bed Mobility: Supine to Sit     Supine to sit: Mod assist     General bed mobility comments: attempted supine>sit with HOB elevated. Pt ultimately declined due to pain. Pt unable to lift RLE to assist with bed mobility.  Transfers Overall transfer level: Needs assistance Equipment used: Rolling walker (2 wheeled) Transfers: Sit to/from Stand Sit to Stand: +2 safety/equipment;Mod assist         General transfer comment: Per PT note pt still needing +2 physical assistance for transfers.    Balance     Sitting balance-Leahy Scale: Fair Sitting balance - Comments: made a few attempts to lay back down once sitting up     Standing balance-Leahy Scale: Poor Standing balance  comment: Requires bil UE support and assist for balance                   ADL                                                Vision                     Perception     Praxis      Cognition   Behavior During Therapy: [facility] for tasks assessed/performed Overall Cognitive Status: Impaired/Different from baseline Area of Impairment: Attention;Following commands;Problem solving;Safety/judgement   Current Attention Level: Sustained Memory: Decreased short-term memory  Following Commands: Follows one step commands inconsistently;Follows one step commands with increased time Safety/Judgement: Decreased awareness of safety;Decreased awareness of deficits   Problem Solving: Slow processing;Decreased initiation;Difficulty sequencing;Requires verbal cues;Requires tactile cues General Comments: continued difficulty with comprehension    Extremity/Trunk Assessment               Exercises     Shoulder Instructions       General Comments      Pertinent Vitals/ Pain       Pain Assessment: 0-10 Pain Score: 9  Faces Pain Scale: Hurts even more Pain Location: r knee Pain Descriptors / Indicators: Aching;Burning;Grimacing Pain Intervention(s): Limited activity within patient's tolerance;Premedicated before session;Ice applied  Home Living  Prior Functioning/Environment              Frequency Min 3X/week     Progress Toward Goals  OT Goals(current goals can now be found in the care plan section)  Progress towards OT goals: Not progressing toward goals - comment  Acute Rehab OT Goals Patient Stated Goal: agreeable to amb OT Goal Formulation: With patient Time For Goal Achievement: 01/10/14 Potential to Achieve Goals: Fair ADL Goals Pt Will Perform Lower Body Bathing: with min assist;with adaptive equipment;sit to/from stand Pt Will Perform Lower Body Dressing: with min  assist;with adaptive equipment;sit to/from stand Pt Will Transfer to Toilet: with min guard assist;ambulating;bedside commode Pt Will Perform Toileting - Clothing Manipulation and hygiene: with min assist;sit to/from stand Pt Will Perform Tub/Shower Transfer: with min guard assist;ambulating;3 in 1;rolling walker  Plan Discharge plan needs to be updated    Co-evaluation                 End of Session     Activity Tolerance Patient limited by pain;Patient limited by lethargy   Patient Left in bed;with call bell/phone within reach;with family/visitor present   Nurse Communication          Time: 2979-8921 OT Time Calculation (min): 20 min  Charges: OT General Charges $OT Visit: 1 Procedure OT Treatments $Self Care/Home Management : 8-22 mins  Hortencia Pilar 01/04/2014, 12:00 PM

## 2014-01-05 LAB — URINE CULTURE
Colony Count: NO GROWTH
Culture: NO GROWTH
SPECIAL REQUESTS: NORMAL

## 2014-01-05 LAB — CBC
HCT: 35 % — ABNORMAL LOW (ref 36.0–46.0)
Hemoglobin: 11.8 g/dL — ABNORMAL LOW (ref 12.0–15.0)
MCH: 33.1 pg (ref 26.0–34.0)
MCHC: 33.7 g/dL (ref 30.0–36.0)
MCV: 98.3 fL (ref 78.0–100.0)
PLATELETS: 132 10*3/uL — AB (ref 150–400)
RBC: 3.56 MIL/uL — ABNORMAL LOW (ref 3.87–5.11)
RDW: 11.9 % (ref 11.5–15.5)
WBC: 6.9 10*3/uL (ref 4.0–10.5)

## 2014-01-05 MED ORDER — OXYCODONE HCL 5 MG PO TABS: 5.0000 mg | ORAL_TABLET | ORAL | Status: DC | PRN

## 2014-01-05 MED ORDER — ENOXAPARIN SODIUM 40 MG/0.4ML ~~LOC~~ SOLN: 40.0000 mg | SUBCUTANEOUS | Status: DC

## 2014-01-05 MED ORDER — METHOCARBAMOL 500 MG PO TABS: 500.0000 mg | ORAL_TABLET | Freq: Four times a day (QID) | ORAL | Status: DC | PRN

## 2014-01-05 MED ORDER — TRAMADOL HCL 50 MG PO TABS: 50.0000 mg | ORAL_TABLET | ORAL | Status: DC | PRN

## 2014-01-05 NOTE — Progress Notes (Signed)
Physical Therapy Treatment Patient Details Name: Denise Kelly MRN: [id number] DOB: Jan 03, 1941 Today's Date: 01/05/2014    History of Present Illness R TKA; WBAT; previous LTKA    PT Comments    Pt continues to be very anxious with mobility and recommend 2 person (A) for safety with all mobility. Pt moving better with max encouragement; did c/o lightheaded and required sitting rest break for safety. At length discussion with family regarding D/C disposition; family and pt agreeable to SNF; encouraged OOB for meals with nursing and to [facility] for incr activity.   Follow Up Recommendations  Supervision/Assistance - 24 hour;SNF     Equipment Recommendations  Rolling walker with 5" wheels;3in1 (PT)    Recommendations for Other Services       Precautions / Restrictions Precautions Precautions: Knee;Fall Precaution Comments: reinforced knee precautions  Required Braces or Orthoses: Knee Immobilizer - Right Knee Immobilizer - Right: On when out of bed or walking Restrictions Weight Bearing Restrictions: Yes RLE Weight Bearing: Weight bearing as tolerated    Mobility  Bed Mobility Overal bed mobility: Needs Assistance Bed Mobility: Supine to Sit     Supine to sit: Min assist;HOB elevated     General bed mobility comments: (A) to advance Rt LE off EOB and cues for sequencing and hand placement   Transfers Overall transfer level: Needs assistance Equipment used: Rolling walker (2 wheeled) Transfers: Sit to/from [facility] Sit to Stand: +2 safety/equipment;Mod assist Stand pivot transfers: Mod assist;+2 safety/equipment       General transfer comment: pt very anxious and fearful of falling with transfers; 2 persons for safety and to encourage pt of her safety with transfers; max cues for sequencing and hand placement; performed SPT from bed to [facility] initially then sit to stand for activity  Ambulation/Gait Ambulation/Gait assistance: Min assist;+2  safety/equipment (2nd person following with chair due to fatigue ) Ambulation Distance (Feet): 14 Feet Assistive device: Rolling walker (2 wheeled) Gait Pattern/deviations: Step-to pattern;Decreased stance time - right;Decreased step length - left;Shuffle;Antalgic;Trunk flexed Gait velocity: very guarded and decr due to pain  Gait velocity interpretation: Below normal speed for age/gender General Gait Details: pt requires simple one step commands with step by step instructions and min (A) due to anxiety and fear of falling; pt unsteady with gt and maintains fwd flexed posture; 2nd person following with chair for safety; pt requiring return to sitting position due to c/o "lightheadedness"; pt is a fall risk; (A) to manage RW around obstacles    Stairs            Wheelchair Mobility    Modified Rankin (Stroke Patients Only)       Balance Overall balance assessment: Needs assistance;History of Falls (fall in hospital stay) Sitting-balance support: Feet supported Sitting balance-Leahy Scale: Fair Sitting balance - Comments: pt guarded and not comfortable on side of bed   Standing balance support: During functional activity;Bilateral upper extremity supported Standing balance-Leahy Scale: Poor Standing balance comment: pt relying heavily on RW and min (A) for support and balance                    Cognition Arousal/Alertness: Awake/alert Behavior During Therapy: Anxious Overall Cognitive Status: Impaired/Different from baseline Area of Impairment: Problem solving;Safety/judgement     Memory: Decreased recall of precautions;Decreased short-term memory       Problem Solving: Slow processing;Decreased initiation;Difficulty sequencing;Requires verbal cues;Requires tactile cues General Comments: pt very anxious; unsure if this is baseline vs new; requires slow one  step commands    Exercises Total Joint Exercises Ankle Circles/Pumps: AROM;Both;10 reps;Seated Quad Sets:  AROM;Right;10 reps;Other (comment) (limited by pain) Long Arc Quad: AAROM;Right;5 reps;Seated Knee Flexion: AROM;Right;5 reps;Seated Goniometric ROM: flexion to 85 degrees AROM limited by pain today    General Comments General comments (skin integrity, edema, etc.): at length discussion with family regarding D/C disposition; family and pt agreeable to SNF; encouraged OOB for meals with nursing and to [facility] for incr activity      Pertinent Vitals/Pain Pain Assessment: 0-10 Pain Score: 9  Pain Location: Rt knee Pain Descriptors / Indicators: Aching;Sharp Pain Intervention(s): Limited activity within patient's tolerance;Monitored during session;Repositioned    Home Living                      Prior Function            PT Goals (current goals can now be found in the care plan section) Acute Rehab PT Goals Patient Stated Goal: to not fall again PT Goal Formulation: With patient Time For Goal Achievement: 01/09/14 Potential to Achieve Goals: Good Progress towards PT goals: Progressing toward goals    Frequency  7X/week    PT Plan Discharge plan needs to be updated    Co-evaluation             End of Session Equipment Utilized During Treatment: Gait belt Activity Tolerance: Patient tolerated treatment well Patient left: in chair;with call bell/phone within reach;with chair alarm set;with family/visitor present     Time: 7253-6644 PT Time Calculation (min): 30 min  Charges:  $Gait Training: 8-22 mins $Therapeutic Exercise: 8-22 mins                    G CodesMelina Modena Howells, [place] 01/05/2014, 2:11 PM

## 2014-01-05 NOTE — Discharge Summary (Signed)
[place]   Lara Mulch, MD   Carlynn Spry, PA-C [place], [place], [place]  [address]                             [phone]  PATIENT ID: Denise Kelly        MRN:  [id number]          DOB/AGE: 72-Feb-1942 / 73 y.o.    DISCHARGE SUMMARY  ADMISSION DATE:    01/02/2014 DISCHARGE DATE:   01/06/2014  ADMISSION DIAGNOSIS: osteoarthritis right knee    DISCHARGE DIAGNOSIS:  osteoarthritis right knee    ADDITIONAL DIAGNOSIS: Active Problems:   S/P total knee arthroplasty  Past Medical History  Diagnosis Date  . Complication of anesthesia     "felt drunk for a week after"  . Hypertension     not being treated with medication  . Palpitations   . Anxiety   . GERD (gastroesophageal reflux disease)   . Arthritis   . Cancer     skin    PROCEDURE: Procedure(s): TOTAL KNEE ARTHROPLASTY on 01/02/2014  CONSULTS:     HISTORY:  See H&P in chart  HOSPITAL COURSE:  Denise Kelly is a 73 y.o. admitted on 01/02/2014 and found to have a diagnosis of osteoarthritis right knee.  After appropriate laboratory studies were obtained  they were taken to the operating room on 01/02/2014 and underwent Procedure(s): TOTAL KNEE ARTHROPLASTY.   They were given perioperative antibiotics:  Anti-infectives   Start     Dose/Rate Route Frequency Ordered Stop   01/02/14 1500  ceFAZolin (ANCEF) IVPB 2 g/50 mL premix     2 g 100 mL/hr over 30 Minutes Intravenous Every 6 hours 01/02/14 1327 01/02/14 2135   01/01/14 0842  ceFAZolin (ANCEF) IVPB 2 g/50 mL premix  Status:  Discontinued     2 g 100 mL/hr over 30 Minutes Intravenous On call to O.R. 01/01/14 2130 01/02/14 1327    .  Tolerated the procedure well.  Placed with a foley intraoperatively.  Given Ofirmev at induction and for 48 hours.    POD# 1: Vital signs were stable.  Patient denied Chest pain, shortness of breath, or calf pain.  Patient was started on Lovenox 30 mg subcutaneously twice daily at  8am.  Consults to PT, OT, and care management were made.  The patient was weight bearing as tolerated.  CPM was placed on the operative leg 0-90 degrees for 6-8 hours a day.  Incentive spirometry was taught.  Dressing was changed.  Hemovac was discontinued.      POD #2, Continued  PT for ambulation and exercise program.  IV saline locked.  O2 discontinued.    The remainder of the hospital course was dedicated to ambulation and strengthening.   The patient was discharged on 3 Days Post-Op in  Good condition.  Blood products given:none  DIAGNOSTIC STUDIES: Recent vital signs: Patient Vitals for the past 24 hrs:  BP Temp Temp src Pulse Resp SpO2  01/05/14 1600 - - - - 18 -  01/05/14 1433 147/76 mmHg 98.5 F (36.9 C) Oral 90 18 93 %  01/05/14 1200 - - - - 18 -  01/05/14 0800 - - - - 18 -  01/05/14 0422 144/75 mmHg 98.7 F (37.1 C) - 102 18 92 %  01/04/14 2017 144/72 mmHg 98.3 F (36.8 C) - 101 18 99 %  Recent laboratory studies:  Recent Labs  01/02/14 1425 01/03/14 0540 01/04/14 0420 01/05/14 0715  WBC 6.4 10.7* 8.8 6.9  HGB 14.2 13.8 11.9* 11.8*  HCT 41.5 42.3 35.9* 35.0*  PLT 139* 166 133* 132*    Recent Labs  01/02/14 1425 01/03/14 0540  NA  --  137  K  --  3.7  CL  --  97  CO2  --  27  BUN  --  11  CREATININE 0.73 0.84  GLUCOSE  --  153*  CALCIUM  --  8.3*   Lab Results  Component Value Date   INR 1.16 12/29/2013   INR 0.97 09/20/2009     Recent Radiographic Studies :  Dg Chest 2 View  12/29/2013   CLINICAL DATA:  Preop for right knee replacement  EXAM: CHEST  2 VIEW  COMPARISON:  04/20/2009  FINDINGS: Cardiomediastinal silhouette is unremarkable. No acute infiltrate or pleural effusion. No pulmonary edema. Stable calcified granuloma in right upper lobe. Mild degenerative changes thoracic spine.  IMPRESSION: No active cardiopulmonary disease.   Electronically Signed   By: Lahoma Crocker M.D.   On: 12/29/2013 16:56   Dg Pelvis 1-2 Views  01/03/2014    CLINICAL DATA:  Chronic pelvic and hip soreness, fell last night, recent and RIGHT knee surgery/replacement on 01/02/2014  EXAM: PELVIS - 1-2 VIEW  COMPARISON:  None  FINDINGS: Diffuse osseous demineralization.  Minimal narrowing of the hip joints.  SI joints symmetric.  Degenerative disc and facet disease changes lower lumbar spine.  No acute fracture, dislocation, or bone destruction.  Numerous RIGHT pelvic phleboliths.  IMPRESSION: No acute osseous abnormalities  Degenerative disc and facet disease changes lower lumbar spine.   Electronically Signed   By: Lavonia Dana M.D.   On: 01/03/2014 09:28   Dg Knee 1-2 Views Right  01/03/2014   CLINICAL DATA:  RIGHT knee pain, fell last night, recent knee surgery/replacement on 01/02/2014  EXAM: RIGHT KNEE - 1-2 VIEW  COMPARISON:  None  FINDINGS: Osseous demineralization.  Components of RIGHT knee prosthesis identified.  No acute fracture or dislocation.  Deformity of the proximal fibular diaphysis suggests sequela of old healed fracture.  Expected soft tissue swelling, skin clips and surgical drain related to surgery.  IMPRESSION: RIGHT knee prosthesis without acute complication.   Electronically Signed   By: Lavonia Dana M.D.   On: 01/03/2014 09:27    DISCHARGE INSTRUCTIONS: Discharge Instructions   CPM    Complete by:  As directed   Continuous passive motion machine (CPM):      Use the CPM from 0 to 90 for 6-8 hours per day.      You may increase by 10 per day.  You may break it up into 2 or 3 sessions per day.      Use CPM for 2 weeks or until you are told to stop.     Call MD / Call 911    Complete by:  As directed   If you experience chest pain or shortness of breath, CALL 911 and be transported to the hospital emergency room.  If you develope a fever above 101 F, pus (white drainage) or increased drainage or redness at the wound, or calf pain, call your surgeon's office.     Change dressing    Complete by:  As directed   Change dressing on Saturday,  then change the dressing daily with sterile 4 x 4 inch gauze dressing and apply TED hose.  Constipation Prevention    Complete by:  As directed   Drink plenty of fluids.  Prune juice may be helpful.  You may use a stool softener, such as Colace (over the counter) 100 mg twice a day.  Use MiraLax (over the counter) for constipation as needed.     Diet - low sodium heart healthy    Complete by:  As directed      Do not put a pillow under the knee. Place it under the heel.    Complete by:  As directed      Driving restrictions    Complete by:  As directed   No driving for 6 weeks     Increase activity slowly as tolerated    Complete by:  As directed      Lifting restrictions    Complete by:  As directed   No lifting for 6 weeks     TED hose    Complete by:  As directed   Use stockings (TED hose) for 2 weeks on both leg(s).  You may remove them at night for sleeping.           DISCHARGE MEDICATIONS:     Medication List         acetaminophen 500 MG tablet  Commonly known as:  TYLENOL  Take 1,000 mg by mouth daily as needed for mild pain.     amoxicillin 500 MG capsule  Commonly known as:  AMOXIL  Take 2,000 mg by mouth as needed (dental procedure).     AZO TABS PO  Take 1-2 tablets by mouth daily as needed (vaginal atrophy).     citalopram 10 MG tablet  Commonly known as:  CELEXA  Take 10 mg by mouth daily.     CLARITIN 10 MG tablet  Generic drug:  loratadine  Take 10 mg by mouth 4 (four) times daily as needed for itching.     enoxaparin 40 MG/0.4ML injection  Commonly known as:  LOVENOX  Inject 0.4 mLs (40 mg total) into the skin daily.     eszopiclone 3 MG Tabs  Generic drug:  Eszopiclone  Take 3 mg by mouth at bedtime. Take immediately before bedtime     furosemide 20 MG tablet  Commonly known as:  LASIX  Take 20 mg by mouth daily as needed for edema.     gabapentin 100 MG capsule  Commonly known as:  NEURONTIN  Take 300-400 mg by mouth at bedtime.      hydrOXYzine 25 MG tablet  Commonly known as:  ATARAX/VISTARIL  Take 50 mg by mouth at bedtime.     LOPERAMIDE A-D 2 MG tablet  Generic drug:  loperamide  Take 2 mg by mouth as needed for diarrhea or loose stools.     methocarbamol 500 MG tablet  Commonly known as:  ROBAXIN  Take 1-2 tablets (500-1,000 mg total) by mouth every 6 (six) hours as needed for muscle spasms.     NIGHTTIME PO  Take 1 tablet by mouth at bedtime as needed (sleep).     ondansetron 4 MG disintegrating tablet  Commonly known as:  ZOFRAN-ODT  Take 4 mg by mouth every 6 (six) hours as needed for nausea.     oxyCODONE 5 MG immediate release tablet  Commonly known as:  Oxy IR/ROXICODONE  Take 1-2 tablets (5-10 mg total) by mouth every 3 (three) hours as needed for breakthrough pain.     SYSTANE OP  Apply 1 drop to eye  3 (three) times daily as needed (dry eyes).     traMADol 50 MG tablet  Commonly known as:  ULTRAM  Take 1-2 tablets (50-100 mg total) by mouth every 4 (four) hours as needed for moderate pain.     zolpidem 5 MG tablet  Commonly known as:  AMBIEN  Take 5-10 mg by mouth at bedtime as needed for sleep.        FOLLOW UP VISIT:       Follow-up Information   Follow up with Rudean Haskell, MD. Call on 01/17/2014.   Specialty:  Orthopedic Surgery   Contact information:   [address] [place] [address] [phone]       DISPOSITION: SNF    WBAT RLE   Lovenox last dose 01/16/14  CONDITION:  Good   Porchia Sinkler 01/05/2014, 4:52 PM

## 2014-01-05 NOTE — Progress Notes (Signed)
SPORTS MEDICINE AND JOINT REPLACEMENT  Denise Mulch, MD   Carlynn Spry, PA-C [place], [place], [place]  [address]                             [phone]   PROGRESS NOTE  Subjective:  negative for Chest Pain  negative for Shortness of Breath  negative for Nausea/Vomiting   negative for Calf Pain  negative for Bowel Movement   Tolerating Diet: yes         Patient reports pain as 4 on 0-10 scale.    Objective: Vital signs in last 24 hours:   Patient Vitals for the past 24 hrs:  BP Temp Temp src Pulse Resp SpO2  01/05/14 1600 - - - - 18 -  01/05/14 1433 147/76 mmHg 98.5 F (36.9 C) Oral 90 18 93 %  01/05/14 1200 - - - - 18 -  01/05/14 0800 - - - - 18 -  01/05/14 0422 144/75 mmHg 98.7 F (37.1 C) - 102 18 92 %  01/04/14 2017 144/72 mmHg 98.3 F (36.8 C) - 101 18 99 %    @flow {1959:LAST@   Intake/Output from previous day:   10/21 0701 - 10/22 0700 In: 480 [P.O.:480] Out: 520 [Urine:520]   Intake/Output this shift:   10/22 0701 - 10/22 1900 In: 620 [P.O.:620] Out: -    Intake/Output     10/21 0701 - 10/22 0700 10/22 0701 - 10/23 0700   P.O. 480 620   I.V. (mL/kg)     Total Intake(mL/kg) 480 (4.8) 620 (6.2)   Urine (mL/kg/hr) 520 (0.2)    Total Output 520     Net -40 +620        Urine Occurrence 5 x 3 x      LABORATORY DATA:  Recent Labs  01/02/14 1425 01/03/14 0540 01/04/14 0420 01/05/14 0715  WBC 6.4 10.7* 8.8 6.9  HGB 14.2 13.8 11.9* 11.8*  HCT 41.5 42.3 35.9* 35.0*  PLT 139* 166 133* 132*    Recent Labs  01/02/14 1425 01/03/14 0540  NA  --  137  K  --  3.7  CL  --  97  CO2  --  27  BUN  --  11  CREATININE 0.73 0.84  GLUCOSE  --  153*  CALCIUM  --  8.3*   Lab Results  Component Value Date   INR 1.16 12/29/2013   INR 0.97 09/20/2009    Examination:  General appearance: alert, cooperative and no distress Extremities: Homans sign is negative, no sign of DVT  Wound Exam: clean, dry, intact   Drainage:  None: wound  tissue dry  Motor Exam: EHL and FHL Intact  Sensory Exam: Deep Peroneal normal   Assessment:    3 Days Post-Op  Procedure(s) (LRB): TOTAL KNEE ARTHROPLASTY (Right)  ADDITIONAL DIAGNOSIS:  Active Problems:   S/P total knee arthroplasty  Acute Blood Loss Anemia   Plan: Physical Therapy as ordered Weight Bearing as Tolerated (WBAT)  DVT Prophylaxis:  Lovenox  DISCHARGE PLAN: Skilled Nursing Facility/Rehab  DISCHARGE NEEDS: HHPT, CPM, Walker and 3-in-1 comode seat         Denise Kelly 01/05/2014, 4:47 PM

## 2014-01-06 NOTE — Clinical Social Work Note (Signed)
Pt to be discharged to [facility]. Pt's daughter, Baldo Ash, updated regarding discharge.  [facility]: [phone] Transportation: Pt's daughter, Baldo Ash, transporting pt.  Lubertha Sayres, [place] [phone]) Licensed Clinical Social Worker Orthopedics [phone]) and Surgical [phone])

## 2014-01-06 NOTE — Progress Notes (Signed)
Report called to Claiborne Billings, nurse at [facility]. [name] will transport patient to facility. Patient ready for discharge.

## 2014-01-06 NOTE — Progress Notes (Signed)
Physical Therapy Treatment Patient Details Name: Denise Kelly MRN: [id number] DOB: 23-May-1940 Today's Date: 01/06/2014    History of Present Illness R TKA; WBAT; previous LTKA    PT Comments    Pt slowly progressing with therapy, with max encouragement. Pt fatigues very quickly. Requiring (A) for pericare at [facility]. D/C plan for SNF remains appropriate.   Follow Up Recommendations  Supervision/Assistance - 24 hour;SNF     Equipment Recommendations  Rolling walker with 5" wheels;3in1 (PT)    Recommendations for Other Services       Precautions / Restrictions Precautions Precautions: Knee;Fall Precaution Comments: reinforced knee precautions  Required Braces or Orthoses: Knee Immobilizer - Right Knee Immobilizer - Right: On when out of bed or walking Restrictions Weight Bearing Restrictions: Yes RLE Weight Bearing: Weight bearing as tolerated    Mobility  Bed Mobility Overal bed mobility: Needs Assistance Bed Mobility: Supine to Sit     Supine to sit: Min assist;HOB elevated     General bed mobility comments: incr time due to pain; (A) to advance Rt LE to/off EOB and max cues for sequencing and hand placement   Transfers Overall transfer level: Needs assistance Equipment used: Rolling walker (2 wheeled) Transfers: Sit to/from [facility] Sit to Stand: +2 safety/equipment;Min assist Stand pivot transfers: Min assist;+2 safety/equipment       General transfer comment: pt anxious with all transfers; 2 person for safety; initial transfer to [facility] ; max cues for hand placement and sequencing; (A) to power up with all transfers and maintain balance   Ambulation/Gait Ambulation/Gait assistance: Min assist;+2 safety/equipment Ambulation Distance (Feet): 40 Feet (15', 35') Assistive device: Rolling walker (2 wheeled) Gait Pattern/deviations: Step-to pattern;Decreased stance time - right;Decreased step length - left;Antalgic;Trunk flexed Gait velocity:  decr due to pain Gait velocity interpretation: Below normal speed for age/gender General Gait Details: pt required sitting rest break and max encouragement to progress mobility; pt very anxious; 2nd person for safety to follow with chair; (A) to manage RW and balance; cues for deep breathing during session    Stairs            Wheelchair Mobility    Modified Rankin (Stroke Patients Only)       Balance Overall balance assessment: Needs assistance;History of Falls Sitting-balance support: Feet supported;No upper extremity supported Sitting balance-Leahy Scale: Fair Sitting balance - Comments: sat EOB for ~5 min; no c/o dizziness   Standing balance support: During functional activity;Bilateral upper extremity supported Standing balance-Leahy Scale: Poor Standing balance comment: relies on RW for balance                     Cognition Arousal/Alertness: Awake/alert Behavior During Therapy: Anxious Overall Cognitive Status: Impaired/Different from baseline Area of Impairment: Problem solving;Safety/judgement     Memory: Decreased recall of precautions;Decreased short-term memory Following Commands: Follows one step commands inconsistently;Follows one step commands with increased time Safety/Judgement: Decreased awareness of safety;Decreased awareness of deficits   Problem Solving: Slow processing;Decreased initiation;Difficulty sequencing;Requires verbal cues;Requires tactile cues General Comments: pt very distracted and anxious; requires cues for deep breathing and to slow down. difficulty following goals of session     Exercises Total Joint Exercises Ankle Circles/Pumps: AROM;Both;10 reps;Seated Quad Sets: AROM;Right;10 reps;Seated;Other (comment) (with foam block) Long Arc Quad: AAROM;Right;Seated;10 reps Goniometric ROM: flexion to 90 degrees with max encouragement and AAROM     General Comments        Pertinent Vitals/Pain Pain Assessment: 0-10 Pain  Score: 8  Pain  Location: posterior of Rt knee  Pain Descriptors / Indicators: Burning;Sharp Pain Intervention(s): Monitored during session;Repositioned;Patient requesting pain meds-RN notified    Home Living                      Prior Function            PT Goals (current goals can now be found in the care plan section) Acute Rehab PT Goals Patient Stated Goal: to not fall again PT Goal Formulation: With patient Time For Goal Achievement: 01/09/14 Potential to Achieve Goals: Good Progress towards PT goals: Progressing toward goals    Frequency  7X/week    PT Plan Current plan remains appropriate    Co-evaluation             End of Session Equipment Utilized During Treatment: Gait belt;Right knee immobilizer Activity Tolerance: Patient tolerated treatment well Patient left: in chair;with call bell/phone within reach     Time: 0845-0910 PT Time Calculation (min): 25 min  Charges:  $Gait Training: 8-22 mins $Therapeutic Exercise: 8-22 mins                    G CodesElie Confer [place], [place] 01/06/2014, 10:04 AM

## 2014-01-09 LAB — CYTOLOGY - PAP

## 2014-01-17 ENCOUNTER — Encounter (HOSPITAL_COMMUNITY): Payer: Self-pay | Admitting: Emergency Medicine

## 2014-01-17 ENCOUNTER — Emergency Department (HOSPITAL_COMMUNITY): Payer: Medicare Other

## 2014-01-17 ENCOUNTER — Emergency Department (HOSPITAL_COMMUNITY)
Admission: EM | Admit: 2014-01-17 | Discharge: 2014-01-17 | Disposition: A | Discharge status: Home / Self Care | Payer: Medicare Other | Source: Home / Self Care | Attending: Emergency Medicine | Admitting: Emergency Medicine

## 2014-01-17 DIAGNOSIS — R4182 Altered mental status, unspecified: Secondary | ICD-10-CM

## 2014-01-17 DIAGNOSIS — I10 Essential (primary) hypertension: Secondary | ICD-10-CM

## 2014-01-17 DIAGNOSIS — M1990 Unspecified osteoarthritis, unspecified site: Secondary | ICD-10-CM | POA: Insufficient documentation

## 2014-01-17 DIAGNOSIS — E876 Hypokalemia: Secondary | ICD-10-CM | POA: Insufficient documentation

## 2014-01-17 DIAGNOSIS — Z87891 Personal history of nicotine dependence: Secondary | ICD-10-CM | POA: Insufficient documentation

## 2014-01-17 DIAGNOSIS — R509 Fever, unspecified: Secondary | ICD-10-CM

## 2014-01-17 DIAGNOSIS — Z85828 Personal history of other malignant neoplasm of skin: Secondary | ICD-10-CM

## 2014-01-17 DIAGNOSIS — E669 Obesity, unspecified: Secondary | ICD-10-CM | POA: Insufficient documentation

## 2014-01-17 DIAGNOSIS — Z8719 Personal history of other diseases of the digestive system: Secondary | ICD-10-CM | POA: Insufficient documentation

## 2014-01-17 DIAGNOSIS — A4151 Sepsis due to Escherichia coli [E. coli]: Secondary | ICD-10-CM | POA: Diagnosis not present

## 2014-01-17 DIAGNOSIS — M25561 Pain in right knee: Secondary | ICD-10-CM | POA: Diagnosis not present

## 2014-01-17 LAB — CBC WITH DIFFERENTIAL/PLATELET
BASOS ABS: 0 10*3/uL (ref 0.0–0.1)
BASOS PCT: 0 % (ref 0–1)
Eosinophils Absolute: 0 10*3/uL (ref 0.0–0.7)
Eosinophils Relative: 0 % (ref 0–5)
HCT: 39 % (ref 36.0–46.0)
Hemoglobin: 13.3 g/dL (ref 12.0–15.0)
LYMPHS PCT: 7 % — AB (ref 12–46)
Lymphs Abs: 0.6 10*3/uL — ABNORMAL LOW (ref 0.7–4.0)
MCH: 32.1 pg (ref 26.0–34.0)
MCHC: 34.1 g/dL (ref 30.0–36.0)
MCV: 94.2 fL (ref 78.0–100.0)
MONO ABS: 0.9 10*3/uL (ref 0.1–1.0)
Monocytes Relative: 11 % (ref 3–12)
Neutro Abs: 6.6 10*3/uL (ref 1.7–7.7)
Neutrophils Relative %: 82 % — ABNORMAL HIGH (ref 43–77)
PLATELETS: 218 10*3/uL (ref 150–400)
RBC: 4.14 MIL/uL (ref 3.87–5.11)
RDW: 12 % (ref 11.5–15.5)
WBC: 8.1 10*3/uL (ref 4.0–10.5)

## 2014-01-17 LAB — LIPASE, BLOOD: LIPASE: 9 U/L — AB (ref 11–59)

## 2014-01-17 LAB — COMPREHENSIVE METABOLIC PANEL
ALBUMIN: 3.3 g/dL — AB (ref 3.5–5.2)
ALT: 14 U/L (ref 0–35)
AST: 22 U/L (ref 0–37)
Alkaline Phosphatase: 126 U/L — ABNORMAL HIGH (ref 39–117)
Anion gap: 18 — ABNORMAL HIGH (ref 5–15)
BUN: 3 mg/dL — ABNORMAL LOW (ref 6–23)
CO2: 24 meq/L (ref 19–32)
CREATININE: 0.65 mg/dL (ref 0.50–1.10)
Calcium: 8.6 mg/dL (ref 8.4–10.5)
Chloride: 99 mEq/L (ref 96–112)
GFR calc Af Amer: >90 mL/min (ref >90)
GFR, EST NON AFRICAN AMERICAN: 87 mL/min — AB (ref >90)
Glucose, Bld: 147 mg/dL — ABNORMAL HIGH (ref 70–99)
Potassium: 2.8 mEq/L — CL (ref 3.7–5.3)
SODIUM: 141 meq/L (ref 137–147)
Total Bilirubin: 1.4 mg/dL — ABNORMAL HIGH (ref 0.3–1.2)
Total Protein: 6.5 g/dL (ref 6.0–8.3)

## 2014-01-17 LAB — SEDIMENTATION RATE: Sed Rate: 22 mm/hr (ref 0–22)

## 2014-01-17 LAB — URINALYSIS, ROUTINE W REFLEX MICROSCOPIC
Bilirubin Urine: NEGATIVE
GLUCOSE, UA: NEGATIVE mg/dL
KETONES UR: 40 mg/dL — AB
Leukocytes, UA: NEGATIVE
Nitrite: NEGATIVE
PROTEIN: NEGATIVE mg/dL
Specific Gravity, Urine: 1.01 (ref 1.005–1.030)
UROBILINOGEN UA: 1 mg/dL (ref 0.0–1.0)
pH: 8.5 — ABNORMAL HIGH (ref 5.0–8.0)

## 2014-01-17 LAB — I-STAT CG4 LACTIC ACID, ED: LACTIC ACID, VENOUS: 1.98 mmol/L (ref 0.5–2.2)

## 2014-01-17 LAB — URINE MICROSCOPIC-ADD ON

## 2014-01-17 LAB — C-REACTIVE PROTEIN: CRP: 5 mg/dL — ABNORMAL HIGH (ref <0.60)

## 2014-01-17 MED ORDER — POTASSIUM CHLORIDE 10 MEQ/100ML IV SOLN
10.0000 meq | Freq: Once | INTRAVENOUS | Status: AC
  Administered 2014-01-17: 10 meq via INTRAVENOUS
  Filled 2014-01-17: qty 100

## 2014-01-17 MED ORDER — HYDROMORPHONE HCL 1 MG/ML IJ SOLN
1.0000 mg | Freq: Once | INTRAMUSCULAR | Status: AC
  Administered 2014-01-17: 1 mg via INTRAVENOUS
  Filled 2014-01-17: qty 1

## 2014-01-17 MED ORDER — ACETAMINOPHEN 500 MG PO TABS
1000.0000 mg | ORAL_TABLET | Freq: Once | ORAL | Status: AC
  Administered 2014-01-17: 1000 mg via ORAL
  Filled 2014-01-17: qty 2

## 2014-01-17 MED ORDER — ACETAMINOPHEN 325 MG PO TABS: 650.0000 mg | ORAL_TABLET | Freq: Once | ORAL | Status: DC

## 2014-01-17 MED ORDER — SODIUM CHLORIDE 0.9 % IV BOLUS (SEPSIS)
1000.0000 mL | Freq: Once | INTRAVENOUS | Status: AC
  Administered 2014-01-17: 1000 mL via INTRAVENOUS

## 2014-01-17 MED ORDER — DEXTROSE 5 % IV SOLN
2.0000 g | Freq: Once | INTRAVENOUS | Status: DC
  Filled 2014-01-17: qty 2

## 2014-01-17 MED ORDER — POTASSIUM CHLORIDE CRYS ER 20 MEQ PO TBCR
40.0000 meq | EXTENDED_RELEASE_TABLET | Freq: Once | ORAL | Status: AC
  Administered 2014-01-17: 40 meq via ORAL
  Filled 2014-01-17: qty 2

## 2014-01-17 NOTE — ED Notes (Signed)
Ortho MD at bedside placing steri strips and wrap to rt knee.

## 2014-01-17 NOTE — Consult Note (Signed)
Denise Kelly MRN:  [id number] DOB/SEX:  [birth year]/female  CHIEF COMPLAINT:  Right knee swelling, AMS, ?infection  HISTORY: Patient is a 73 y.o. female comes in to the ED c/o confusion and right knee swelling. Patient is 2 weeks s/p right TKA. Post operative course is significant for 5 day hospital stay after TKA done 01/02/14, a fall 01/03/14 and subsequent confusion with inadequate physical therapy. She was released to SNF on 01/06/14 where she stayed until 01/11/14. SNF course was uneventful. Per daughter's report who was bedside, patient was doing extremely well until yesterday. Patient developed an increase in her confusion and started refusing several of her medications. She was brought to the ED by ambulance. Ortho on call was notified who alerted [place] to her presence. Patient and daughter deny new fall or LOC.  PAST MEDICAL HISTORY: Patient Active Problem List   Diagnosis Date Noted  . S/P total knee arthroplasty 01/02/2014  . Right knee pain 11/10/2013  . Essential hypertension, benign 11/10/2013  . LAFB (left anterior fascicular block) 11/10/2013  . Obesity, unspecified 11/10/2013   Past Medical History  Diagnosis Date  . Complication of anesthesia     "felt drunk for a week after"  . Hypertension     not being treated with medication  . Palpitations   . Anxiety   . GERD (gastroesophageal reflux disease)   . Arthritis   . Cancer     skin   Past Surgical History  Procedure Laterality Date  . Joint replacement Left     knee  . Tonsillectomy    . Tubal ligation    . 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 Breast surgery      2 breast biopsies  . Eye surgery Bilateral     cataracts  . Colonoscopy    . Total knee arthroplasty Right 01/02/2014    Dr Ronnie Derby  . Total knee arthroplasty Right 01/02/2014    Procedure: TOTAL KNEE ARTHROPLASTY;  Surgeon: [name], MD;  Location: [place];  Service: Orthopedics;  Laterality: Right;     MEDICATIONS:   (Not in a hospital  admission)  ALLERGIES:   Allergies  Allergen Reactions  . Sulfa Antibiotics Hives and Swelling  . Ciprofloxacin Itching    REVIEW OF SYSTEMS:  A comprehensive review of systems was negative.   FAMILY HISTORY:  History reviewed. No pertinent family history.    SOCIAL HISTORY:   History  Substance Use Topics  . Smoking status: Former Research scientist (life sciences)  . Smokeless tobacco: Never Used     Comment: quit smoking many years ago   . Alcohol Use: No     EXAMINATION:  Vital signs in last 24 hours: Temp:  [97.6 F (36.4 C)-100.9 F (38.3 C)] 100.9 F (38.3 C) (11/03 0625) Pulse Rate:  [93-99] 95 (11/03 0815) Resp:  [17-26] 17 (11/03 0800) BP: (131-154)/(74-102) 131/78 mmHg (11/03 0815) SpO2:  [94 %-98 %] 94 % (11/03 0815)  General appearance: alert, cooperative and no distress Lungs: clear to auscultation bilaterally Heart: regular rate and rhythm, S1, S2 normal, no murmur, click, rub or gallop Abdomen: soft, non-tender; bowel sounds normal; no masses,  no organomegaly Extremities: extremities normal, atraumatic, no cyanosis or edema and Homans sign is negative, no sign of DVT Pulses: 2+ and symmetric Skin: Skin color, texture, turgor normal. No rashes or lesions Neurologic: Alert and oriented X 3, normal strength and tone. Normal symmetric reflexes. Normal coordination and gait  Musculoskeletal:  ROM 0-90, Ligaments intact,  no signs of infection, staples removed and wound was dressed with steri strips, abd pad and ace wrap.  Assessment/Plan:  1. Patient evaluated by Dr Ronnie Derby and myself, Will release in daughters care no signs of knee infection.  2. ER will work up hypokalemia and urine before discharge.  3. Will start outpatient PT tomorrow at our office.  4. Patient seems to be at baseline as far as mental status.  Luwanna Brossman 01/17/2014, 8:47 AM

## 2014-01-17 NOTE — ED Notes (Signed)
Patient here from home with complaint of altered mental status and possible fevers. Daughter became concerned about patient when she would not take medications yesterday. Afterward daughter and patient took nap, and daughter woke to find that patient had had BM on floor and was acting very abnormal. Right knee replacement was completed recently and patient has been undergoing therapy. Right knee noted to be swollen and hot. Staples remain from surgery, edges well approximated, no drainage noted. Patient answers questions appropriately, but asks the same questions repeatedly.

## 2014-01-17 NOTE — ED Notes (Signed)
Critical Lab Potassium 2.8 Dr. Christy Gentles made aware.

## 2014-01-17 NOTE — ED Provider Notes (Signed)
i assumed care at signout to f/u on orthopedics consult Ortho has seen patient and do not feel fever is due to her incision from recent surgery Pt is awake/alert, no distress She does not appear confused She denies new HA She has had mild cough recently, but no SOB No abd pain No other rash reported Her vitals are improved BP 131/78 mmHg  Pulse 95  Temp(Src) 98.2 F (36.8 C) (Oral)  Resp 17  SpO2 94% She would like to go home Daughter at bedside is agreeable with plan No UTI IV/PO potassium given She did have prolonged QT - d/w patient/family and advised to taper/stop celexa as this can prolong QT.  Suspect QT will improve with potassium replacement She has PT followup tomorrow   Sharyon Cable, MD 01/17/14 1004

## 2014-01-17 NOTE — Discharge Instructions (Signed)

## 2014-01-17 NOTE — ED Notes (Signed)
Results of lactic acid given to Dr. Claudine Mouton on Pod A

## 2014-01-17 NOTE — ED Provider Notes (Signed)
I assumed care of patient at signout Seen by orthopedics Linton Rump) Sutures have been removed and it is felt that the knee is not source of fever Will await urinalysis She is also hypoKalemic this will be replenished   EKG Interpretation  Date/Time:  Tuesday January 17 2014 07:44:01 EST Ventricular Rate:  96 PR Interval:  185 QRS Duration: 103 QT Interval:  416 QTC Calculation: 526 R Axis:   -51 Text Interpretation:  Sinus rhythm Incomplete RBBB and LAFB Abnormal R-wave progression, late transition Prolonged QT interval No previous ECGs available Confirmed by Christy Gentles  MD, Meigan Pates ([id number]) on 01/17/2014 8:00:34 AM        Sharyon Cable, MD 01/17/14 [phone]

## 2014-01-17 NOTE — ED Notes (Signed)
Patient complained of burning with thee potassium rate changed to 50ML/hr

## 2014-01-17 NOTE — ED Notes (Signed)
MD at bedside. 

## 2014-01-17 NOTE — ED Provider Notes (Signed)
CSN: [id number]     Arrival date & time 01/17/14  0557 History   First MD Initiated Contact with Patient 01/17/14 [phone]     Chief Complaint  Patient presents with  . Altered Mental Status  . Fever     (Consider location/radiation/quality/duration/timing/severity/associated sxs/prior Treatment) HPI  Denise Kelly is a 73 y.o. female with past medical history of a total knee arthroplasty on October 19 coming in with fever and altered mental status. Recent having severe pain in her right knee where the surgery was. It is more swollen and tender. Per the daughter she has been more confused over the last couple of days, and refusing to taking her pain medication. Daughter states this is completely atypical for her. Patient also recently had a bowel movement and urinated on herself which is also abnormal. She denies any fevers at home, productive cough or urinary symptoms. Patient has no further complaints.  10 Systems reviewed and are negative for acute change except as noted in the HPI.    Past Medical History  Diagnosis Date  . Complication of anesthesia     "felt drunk for a week after"  . Hypertension     not being treated with medication  . Palpitations   . Anxiety   . GERD (gastroesophageal reflux disease)   . Arthritis   . Cancer     skin   Past Surgical History  Procedure Laterality Date  . Joint replacement Left     knee  . Tonsillectomy    . Tubal ligation    . 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 Breast surgery      2 breast biopsies  . Eye surgery Bilateral     cataracts  . Colonoscopy    . Total knee arthroplasty Right 01/02/2014    Dr Ronnie Derby  . Total knee arthroplasty Right 01/02/2014    Procedure: TOTAL KNEE ARTHROPLASTY;  Surgeon: [name], MD;  Location: [place];  Service: Orthopedics;  Laterality: Right;   History reviewed. No pertinent family history. History  Substance Use Topics  . Smoking status: Former Research scientist (life sciences)  . Smokeless tobacco: Never  Used     Comment: quit smoking many years ago   . Alcohol Use: No   OB History    No data available     Review of Systems    Allergies  Sulfa antibiotics and Ciprofloxacin  Home Medications   Prior to Admission medications   Medication Sig Start Date End Date Taking? Authorizing Provider  acetaminophen (TYLENOL) 500 MG tablet Take 1,000 mg by mouth daily as needed for mild pain.    Historical Provider, MD  amoxicillin (AMOXIL) 500 MG capsule Take 2,000 mg by mouth as needed (dental procedure).    Historical Provider, MD  citalopram (CELEXA) 10 MG tablet Take 10 mg by mouth daily.  08/12/13   Historical Provider, MD  enoxaparin (LOVENOX) 40 MG/0.4ML injection Inject 0.4 mLs (40 mg total) into the skin daily. 01/05/14   Carlynn Spry, PA-C  Eszopiclone (ESZOPICLONE) 3 MG TABS Take 3 mg by mouth at bedtime. Take immediately before bedtime    Historical Provider, MD  furosemide (LASIX) 20 MG tablet Take 20 mg by mouth daily as needed for edema.  11/01/13   Historical Provider, MD  gabapentin (NEURONTIN) 100 MG capsule Take 300-400 mg by mouth at bedtime.  10/31/13   Historical Provider, MD  hydrOXYzine (ATARAX/VISTARIL) 25 MG tablet Take 50 mg by mouth at bedtime.  08/12/13   Historical Provider, MD  loperamide (LOPERAMIDE A-D) 2 MG tablet Take 2 mg by mouth as needed for diarrhea or loose stools.    Historical Provider, MD  loratadine (CLARITIN) 10 MG tablet Take 10 mg by mouth 4 (four) times daily as needed for itching.    Historical Provider, MD  methocarbamol (ROBAXIN) 500 MG tablet Take 1-2 tablets (500-1,000 mg total) by mouth every 6 (six) hours as needed for muscle spasms. 01/05/14   Carlynn Spry, PA-C  ondansetron (ZOFRAN-ODT) 4 MG disintegrating tablet Take 4 mg by mouth every 6 (six) hours as needed for nausea.  10/31/13   Historical Provider, MD  oxyCODONE (OXY IR/ROXICODONE) 5 MG immediate release tablet Take 1-2 tablets (5-10 mg total) by mouth every 3 (three) hours as needed for  breakthrough pain. 01/05/14   Carlynn Spry, PA-C  Phenazopyridine HCl (AZO TABS PO) Take 1-2 tablets by mouth daily as needed (vaginal atrophy).    Historical Provider, MD  Polyethyl Glycol-Propyl Glycol (SYSTANE OP) Apply 1 drop to eye 3 (three) times daily as needed (dry eyes).    Historical Provider, MD  Pseudoeph-Doxylamine-DM-APAP (NIGHTTIME PO) Take 1 tablet by mouth at bedtime as needed (sleep).    Historical Provider, MD  traMADol (ULTRAM) 50 MG tablet Take 1-2 tablets (50-100 mg total) by mouth every 4 (four) hours as needed for moderate pain. 01/05/14   Carlynn Spry, PA-C  zolpidem (AMBIEN) 5 MG tablet Take 5-10 mg by mouth at bedtime as needed for sleep.    Historical Provider, MD   BP 140/102 mmHg  Pulse 99  Temp(Src) 100.9 F (38.3 C) (Rectal)  Resp 26  SpO2 98% Physical Exam  Constitutional: She is oriented to person, place, and time. She appears well-developed and well-nourished. She appears distressed.  Obese female  HENT:  Head: Normocephalic and atraumatic.  Nose: Nose normal.  Mouth/Throat: Oropharynx is clear and moist. No oropharyngeal exudate.  Eyes: Conjunctivae and EOM are normal. Pupils are equal, round, and reactive to light. No scleral icterus.  Neck: Normal range of motion. Neck supple. No JVD present. No tracheal deviation present. No thyromegaly present.  Cardiovascular: Normal rate, regular rhythm and normal heart sounds.  Exam reveals no gallop and no friction rub.   No murmur heard. Pulmonary/Chest: Effort normal and breath sounds normal. No respiratory distress. She has no wheezes. She exhibits no tenderness.  Abdominal: Soft. Bowel sounds are normal. She exhibits no distension and no mass. There is no tenderness. There is no rebound and no guarding.  Musculoskeletal: Normal range of motion. She exhibits no edema or tenderness.  Right knee with surgical incision, sutures in place are clean dry and intact. Obvious swelling of the right knee, warm to touch  and tender to palpation.  Lymphadenopathy:    She has no cervical adenopathy.  Neurological: She is alert and oriented to person, place, and time. No cranial nerve deficit.  Skin: Skin is warm and dry. No rash noted. She is not diaphoretic. No erythema. No pallor.  Tactile fever  Nursing note and vitals reviewed.   ED Course  Procedures (including critical care time) Labs Review Labs Reviewed  CULTURE, BLOOD (ROUTINE X 2)  CULTURE, BLOOD (ROUTINE X 2)  URINE CULTURE  CBC WITH DIFFERENTIAL  COMPREHENSIVE METABOLIC PANEL  LIPASE, BLOOD  C-REACTIVE PROTEIN  SEDIMENTATION RATE  URINALYSIS, ROUTINE W REFLEX MICROSCOPIC  I-STAT CG4 LACTIC ACID, ED    Imaging Review No results found.   EKG Interpretation None  MDM   Final diagnoses:  Altered mental state    Patient presents emergency department for fever and altered mental status. Likely source is her right knee based on physical exam. Will evaluate with laboratory studies, chest x-ray, urinalysis. Temp is 100.9.  I spoke with orthopedic surgery on call who will evaluate the patient at the bedside. Patient was given Dilaudid for pain control and Tylenol for the fever. Patient signed out to Dr. Christy Gentles as pending infectious work up and ortho consult.  I signed out that the patient will likely have to be admitted for fever and AMS once the source is found.  See Dr. Neldon Labella note for the ultimate disposition.   Everlene Balls, MD 01/17/14 1550

## 2014-01-17 NOTE — ED Notes (Signed)
Patient transported to X-ray 

## 2014-01-17 NOTE — ED Notes (Signed)
Ortho Surgeon at bedside

## 2014-01-18 ENCOUNTER — Inpatient Hospital Stay (HOSPITAL_COMMUNITY)
Admission: AD | Admit: 2014-01-18 | Discharge: 2014-01-20 | DRG: 872 | Disposition: A | Discharge status: Home / Self Care | Payer: Medicare Other | Source: Ambulatory Visit | Attending: Orthopedic Surgery | Admitting: Orthopedic Surgery

## 2014-01-18 ENCOUNTER — Telehealth (HOSPITAL_BASED_OUTPATIENT_CLINIC_OR_DEPARTMENT_OTHER): Payer: Self-pay | Admitting: Emergency Medicine

## 2014-01-18 ENCOUNTER — Encounter (HOSPITAL_COMMUNITY): Payer: Self-pay

## 2014-01-18 DIAGNOSIS — K219 Gastro-esophageal reflux disease without esophagitis: Secondary | ICD-10-CM | POA: Diagnosis present

## 2014-01-18 DIAGNOSIS — E876 Hypokalemia: Secondary | ICD-10-CM | POA: Diagnosis present

## 2014-01-18 DIAGNOSIS — M25561 Pain in right knee: Secondary | ICD-10-CM | POA: Diagnosis present

## 2014-01-18 DIAGNOSIS — Z9841 Cataract extraction status, right eye: Secondary | ICD-10-CM

## 2014-01-18 DIAGNOSIS — N390 Urinary tract infection, site not specified: Secondary | ICD-10-CM | POA: Diagnosis present

## 2014-01-18 DIAGNOSIS — Z96651 Presence of right artificial knee joint: Secondary | ICD-10-CM | POA: Diagnosis present

## 2014-01-18 DIAGNOSIS — I10 Essential (primary) hypertension: Secondary | ICD-10-CM | POA: Diagnosis present

## 2014-01-18 DIAGNOSIS — M1990 Unspecified osteoarthritis, unspecified site: Secondary | ICD-10-CM | POA: Diagnosis present

## 2014-01-18 DIAGNOSIS — Z113 Encounter for screening for infections with a predominantly sexual mode of transmission: Secondary | ICD-10-CM | POA: Insufficient documentation

## 2014-01-18 DIAGNOSIS — Z85828 Personal history of other malignant neoplasm of skin: Secondary | ICD-10-CM

## 2014-01-18 DIAGNOSIS — Z79899 Other long term (current) drug therapy: Secondary | ICD-10-CM | POA: Diagnosis not present

## 2014-01-18 DIAGNOSIS — R7881 Bacteremia: Secondary | ICD-10-CM | POA: Insufficient documentation

## 2014-01-18 DIAGNOSIS — Z87891 Personal history of nicotine dependence: Secondary | ICD-10-CM

## 2014-01-18 DIAGNOSIS — A4151 Sepsis due to Escherichia coli [E. coli]: Secondary | ICD-10-CM | POA: Diagnosis present

## 2014-01-18 DIAGNOSIS — Z96659 Presence of unspecified artificial knee joint: Secondary | ICD-10-CM

## 2014-01-18 DIAGNOSIS — Z9842 Cataract extraction status, left eye: Secondary | ICD-10-CM | POA: Diagnosis not present

## 2014-01-18 DIAGNOSIS — F419 Anxiety disorder, unspecified: Secondary | ICD-10-CM | POA: Diagnosis present

## 2014-01-18 HISTORY — DX: Alcohol abuse, uncomplicated: F10.10

## 2014-01-18 LAB — CBC WITH DIFFERENTIAL/PLATELET
Basophils Absolute: 0 10*3/uL (ref 0.0–0.1)
Basophils Relative: 0 % (ref 0–1)
EOS ABS: 0.1 10*3/uL (ref 0.0–0.7)
Eosinophils Relative: 1 % (ref 0–5)
HCT: 36.5 % (ref 36.0–46.0)
Hemoglobin: 12.6 g/dL (ref 12.0–15.0)
Lymphocytes Relative: 16 % (ref 12–46)
Lymphs Abs: 1.1 10*3/uL (ref 0.7–4.0)
MCH: 31.3 pg (ref 26.0–34.0)
MCHC: 34.5 g/dL (ref 30.0–36.0)
MCV: 90.8 fL (ref 78.0–100.0)
Monocytes Absolute: 0.9 10*3/uL (ref 0.1–1.0)
Monocytes Relative: 13 % — ABNORMAL HIGH (ref 3–12)
Neutro Abs: 4.6 10*3/uL (ref 1.7–7.7)
Neutrophils Relative %: 70 % (ref 43–77)
PLATELETS: 187 10*3/uL (ref 150–400)
RBC: 4.02 MIL/uL (ref 3.87–5.11)
RDW: 12 % (ref 11.5–15.5)
WBC: 6.6 10*3/uL (ref 4.0–10.5)

## 2014-01-18 LAB — BASIC METABOLIC PANEL
Anion gap: 14 (ref 5–15)
BUN: 6 mg/dL (ref 6–23)
CALCIUM: 8.5 mg/dL (ref 8.4–10.5)
CO2: 26 mEq/L (ref 19–32)
Chloride: 97 mEq/L (ref 96–112)
Creatinine, Ser: 0.65 mg/dL (ref 0.50–1.10)
GFR calc Af Amer: >90 mL/min (ref >90)
GFR, EST NON AFRICAN AMERICAN: 87 mL/min — AB (ref >90)
GLUCOSE: 113 mg/dL — AB (ref 70–99)
Potassium: 2.7 mEq/L — CL (ref 3.7–5.3)
SODIUM: 137 meq/L (ref 137–147)

## 2014-01-18 LAB — APTT: APTT: 39 s — AB (ref 24–37)

## 2014-01-18 LAB — PROTIME-INR
INR: 1.24 (ref 0.00–1.49)
Prothrombin Time: 15.7 seconds — ABNORMAL HIGH (ref 11.6–15.2)

## 2014-01-18 MED ORDER — SENNOSIDES-DOCUSATE SODIUM 8.6-50 MG PO TABS: 1.0000 | ORAL_TABLET | Freq: Every evening | ORAL | Status: DC | PRN

## 2014-01-18 MED ORDER — FLEET ENEMA 7-19 GM/118ML RE ENEM
1.0000 | ENEMA | Freq: Once | RECTAL | Status: AC | PRN
  Filled 2014-01-18: qty 1

## 2014-01-18 MED ORDER — LORATADINE 10 MG PO TABS: 10.0000 mg | ORAL_TABLET | Freq: Two times a day (BID) | ORAL | Status: DC | PRN

## 2014-01-18 MED ORDER — HYDROMORPHONE HCL 1 MG/ML IJ SOLN: 0.5000 mg | INTRAMUSCULAR | Status: DC | PRN

## 2014-01-18 MED ORDER — OXYCODONE HCL 5 MG PO TABS
5.0000 mg | ORAL_TABLET | ORAL | Status: DC | PRN
Start: 2014-01-18 — End: 2014-01-18

## 2014-01-18 MED ORDER — FUROSEMIDE 20 MG PO TABS
20.0000 mg | ORAL_TABLET | Freq: Every day | ORAL | Status: DC | PRN
  Filled 2014-01-18: qty 1

## 2014-01-18 MED ORDER — METHOCARBAMOL 500 MG PO TABS: 500.0000 mg | ORAL_TABLET | Freq: Four times a day (QID) | ORAL | Status: DC | PRN

## 2014-01-18 MED ORDER — HYDROXYZINE HCL 25 MG PO TABS
50.0000 mg | ORAL_TABLET | Freq: Every day | ORAL | Status: DC
  Administered 2014-01-19 – 2014-01-20 (×2): 50 mg via ORAL
  Filled 2014-01-18: qty 2
  Filled 2014-01-18: qty 1
  Filled 2014-01-18: qty 2

## 2014-01-18 MED ORDER — SODIUM CHLORIDE 0.9 % IV SOLN
INTRAVENOUS | Status: DC
  Administered 2014-01-19 – 2014-01-20 (×3): via INTRAVENOUS

## 2014-01-18 MED ORDER — DEXTROSE 5 % IV SOLN
2.0000 g | Freq: Two times a day (BID) | INTRAVENOUS | Status: DC
  Administered 2014-01-19 (×2): 2 g via INTRAVENOUS
  Filled 2014-01-18 (×3): qty 2

## 2014-01-18 MED ORDER — GABAPENTIN 300 MG PO CAPS
300.0000 mg | ORAL_CAPSULE | Freq: Every day | ORAL | Status: DC
  Administered 2014-01-19 – 2014-01-20 (×2): 300 mg via ORAL
  Filled 2014-01-18 (×4): qty 1

## 2014-01-18 MED ORDER — OXYCODONE HCL 5 MG PO TABS
5.0000 mg | ORAL_TABLET | ORAL | Status: DC | PRN
  Administered 2014-01-19 – 2014-01-20 (×4): 10 mg via ORAL
  Filled 2014-01-18 (×4): qty 2

## 2014-01-18 MED ORDER — BISACODYL 5 MG PO TBEC: 5.0000 mg | DELAYED_RELEASE_TABLET | Freq: Every day | ORAL | Status: DC | PRN

## 2014-01-18 MED ORDER — DOCUSATE SODIUM 100 MG PO CAPS
100.0000 mg | ORAL_CAPSULE | Freq: Two times a day (BID) | ORAL | Status: DC
  Filled 2014-01-18 (×5): qty 1

## 2014-01-18 MED ORDER — METHOCARBAMOL 1000 MG/10ML IJ SOLN
500.0000 mg | Freq: Four times a day (QID) | INTRAVENOUS | Status: DC | PRN
  Filled 2014-01-18: qty 5

## 2014-01-18 MED ORDER — CITALOPRAM HYDROBROMIDE 10 MG PO TABS
10.0000 mg | ORAL_TABLET | Freq: Every day | ORAL | Status: DC
  Administered 2014-01-20: 10 mg via ORAL
  Filled 2014-01-18 (×3): qty 1

## 2014-01-18 MED ORDER — TRAMADOL HCL 50 MG PO TABS
50.0000 mg | ORAL_TABLET | ORAL | Status: DC | PRN
  Administered 2014-01-19: 100 mg via ORAL
  Administered 2014-01-19: 50 mg via ORAL
  Filled 2014-01-18: qty 1
  Filled 2014-01-18: qty 2

## 2014-01-18 NOTE — Telephone Encounter (Signed)
Post ED Visit - Positive Culture Follow-up: Successful Patient Follow-Up  Culture assessed and recommendations reviewed by: []  Wes [place], Pharm.D., BCPS []  Heide Guile, Pharm.D., BCPS []  Alycia Rossetti, Pharm.D., BCPS []  [place], Pharm.D., BCPS, AAHIVP []  Legrand [place], Pharm.D., BCPS, AAHIVP []  Hassie Bruce, Pharm.D. []  Milus Glazier, Pharm.D.  Positive blood culture  []  Patient discharged without antimicrobial prescription and treatment is now indicated []  Organism is resistant to prescribed ED discharge antimicrobial [x]  Patient with positive blood cultures  Changes discussed with ED provider: Dr. Nat Christen Patient to return to ED for probable admission Contacted patient, date 01/19/14  1525   Spoke with daughter Baldo Ash at patient's residence, aware needs to return to hospital asap   Hazle Nordmann 01/18/2014, 3:29 PM

## 2014-01-18 NOTE — H&P (Signed)
NAME: Denise Kelly MRN:   [id number] DOB:   May 24, 1940     HISTORY AND PHYSICAL  CHIEF COMPLAINT:  S/p R total knee, UTI, ? infection  HISTORY:   Denise Tomczak Johnstonis a 73 y.o. female s/p right total knee presented to the ED 01/17/14 c/o confusion and pain in right total knee. Labs were run showing hypokalemia, increased fever and pain from right total knee replacement. Labs were drawn and ortho was consulted. Dr. Ronnie Derby examined the patient's knee which showed no signs of infection and staples were removed from the incision. Patient seemed at baseline behaviorally and was d/c after cultures were sent to the lab. Patient was afebrile at the time of discharge. Blood cultures came back positive for gram negative bacteria and positive for e.coli in urine. Patient was afebrile on 01/18/14 on report from daughter who is the primary caregiver however, pain was uncontrolled and patient was still experiencing UTI symptoms. ID was consulted on antibiotic choices and cipro was recommended. Patient has an allergy to cipro and has continued to not function well in her home environment. Patient was direct admitted to the hospital for further evaluation, UTI treatment and further infectious workup by ID.    PAST MEDICAL HISTORY:   Past Medical History  Diagnosis Date  . Complication of anesthesia     "felt drunk for a week after"  . Hypertension     not being treated with medication  . Palpitations   . Anxiety   . GERD (gastroesophageal reflux disease)   . Arthritis   . Cancer     skin    PAST SURGICAL HISTORY:   Past Surgical History  Procedure Laterality Date  . Joint replacement Left     knee  . Tonsillectomy    . Tubal ligation    . 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 Breast surgery      2 breast biopsies  . Eye surgery Bilateral     cataracts  . Colonoscopy    . Total knee arthroplasty Right 01/02/2014    Dr Ronnie Derby  . Total knee arthroplasty Right 01/02/2014    Procedure: TOTAL  KNEE ARTHROPLASTY;  Surgeon: [name], MD;  Location: [facility];  Service: Orthopedics;  Laterality: Right;    MEDICATIONS:   Medications Prior to Admission  Medication Sig Dispense Refill  . acetaminophen (TYLENOL) 500 MG tablet Take 1,000 mg by mouth daily as needed for mild pain.    . citalopram (CELEXA) 10 MG tablet Take 10 mg by mouth at bedtime.   1  . furosemide (LASIX) 20 MG tablet Take 20 mg by mouth daily as needed for edema.     . gabapentin (NEURONTIN) 100 MG capsule Take 300 mg by mouth at bedtime.     . hydrOXYzine (ATARAX/VISTARIL) 25 MG tablet Take 50 mg by mouth at bedtime.     Marland Kitchen loperamide (LOPERAMIDE A-D) 2 MG tablet Take 2 mg by mouth as needed for diarrhea or loose stools.    Marland Kitchen loratadine (CLARITIN) 10 MG tablet Take 10 mg by mouth 4 (four) times daily as needed for itching.    . methocarbamol (ROBAXIN) 500 MG tablet Take 1-2 tablets (500-1,000 mg total) by mouth every 6 (six) hours as needed for muscle spasms. 60 tablet 2  . ondansetron (ZOFRAN-ODT) 4 MG disintegrating tablet Take 4 mg by mouth every 6 (six) hours as needed for nausea.     Marland Kitchen oxyCODONE (OXY IR/ROXICODONE) 5 MG immediate  release tablet Take 1-2 tablets (5-10 mg total) by mouth every 3 (three) hours as needed for breakthrough pain. 90 tablet 0  . Phenazopyridine HCl (AZO TABS PO) Take 1-2 tablets by mouth daily as needed (vaginal atrophy).    Denise Kelly (SYSTANE OP) Apply 1 drop to eye 3 (three) times daily as needed (dry eyes).    . polyethylene Kelly (MIRALAX / GLYCOLAX) packet Take 17 g by mouth daily as needed for moderate constipation.     . Pseudoeph-Doxylamine-DM-APAP (NIGHTTIME PO) Take 1 tablet by mouth at bedtime as needed (sleep).    . senna-docusate (SENOKOT-S) 8.6-50 MG per tablet Take 2 tablets by mouth at bedtime.    . traMADol (ULTRAM) 50 MG tablet Take 1-2 tablets (50-100 mg total) by mouth every 4 (four) hours as needed for moderate pain. 90 tablet 0    ALLERGIES:    Allergies  Allergen Reactions  . Sulfa Antibiotics Hives and Swelling  . Ciprofloxacin Itching  . Motrin [Ibuprofen] Palpitations    REVIEW OF SYSTEMS:   Negative except HPI  FAMILY HISTORY:  No family history on file.  SOCIAL HISTORY:   reports that she has quit smoking. She has never used smokeless tobacco. She reports that she does not drink alcohol or use illicit drugs.  PHYSICAL EXAM:  General appearance: alert, cooperative and anxious, slightly confused Resp: clear to auscultation bilaterally Cardio: regular rate and rhythm, S1, S2 normal, no murmur, click, rub or gallop GI: soft, non-tender; bowel sounds normal; no masses,  no organomegaly Extremities: extremities normal, atraumatic, no cyanosis or edema and Homans sign is negative, no sign of DVT Pulses: 2+ and symmetric Skin: Skin color, texture, turgor normal. No rashes or lesions Neurologic: Alert and oriented X 3, normal strength and tone. Normal symmetric reflexes. Normal coordination and gait Incision/Wound:    LABORATORY STUDIES:  Recent Labs  01/17/14 0620  WBC 8.1  HGB 13.3  HCT 39.0  PLT 218     Recent Labs  01/17/14 0620  NA 141  K 2.8*  CL 99  CO2 24  GLUCOSE 147*  BUN 3*  CREATININE 0.65  CALCIUM 8.6    STUDIES/RESULTS:  Dg Chest 2 View  01/17/2014   CLINICAL DATA:  Shortness of breath.  Altered mental status.  EXAM: CHEST  2 VIEW  COMPARISON:  12/29/2013  FINDINGS: Chronic cardiomegaly and mild aortic tortuosity. Stable appearance of the hila. There is no edema, consolidation, effusion (excluding the extreme inferior posterior costophrenic sulci which are excluded from view), or pneumothorax.  IMPRESSION: No active cardiopulmonary disease.   Electronically Signed   By: Jorje Guild M.D.   On: 01/17/2014 07:24   Dg Chest 2 View  12/29/2013   CLINICAL DATA:  Preop for right knee replacement  EXAM: CHEST  2 VIEW  COMPARISON:  04/20/2009  FINDINGS: Cardiomediastinal silhouette is  unremarkable. No acute infiltrate or pleural effusion. No pulmonary edema. Stable calcified granuloma in right upper lobe. Mild degenerative changes thoracic spine.  IMPRESSION: No active cardiopulmonary disease.   Electronically Signed   By: Lahoma Crocker M.D.   On: 12/29/2013 16:56   Dg Pelvis 1-2 Views  01/03/2014   CLINICAL DATA:  Chronic pelvic and hip soreness, fell last night, recent and RIGHT knee surgery/replacement on 01/02/2014  EXAM: PELVIS - 1-2 VIEW  COMPARISON:  None  FINDINGS: Diffuse osseous demineralization.  Minimal narrowing of the hip joints.  SI joints symmetric.  Degenerative disc and facet disease changes lower lumbar spine.  No acute fracture, dislocation, or bone destruction.  Numerous RIGHT pelvic phleboliths.  IMPRESSION: No acute osseous abnormalities  Degenerative disc and facet disease changes lower lumbar spine.   Electronically Signed   By: Lavonia Dana M.D.   On: 01/03/2014 09:28   Dg Knee 1-2 Views Right  01/03/2014   CLINICAL DATA:  RIGHT knee pain, fell last night, recent knee surgery/replacement on 01/02/2014  EXAM: RIGHT KNEE - 1-2 VIEW  COMPARISON:  None  FINDINGS: Osseous demineralization.  Components of RIGHT knee prosthesis identified.  No acute fracture or dislocation.  Deformity of the proximal fibular diaphysis suggests sequela of old healed fracture.  Expected soft tissue swelling, skin clips and surgical drain related to surgery.  IMPRESSION: RIGHT knee prosthesis without acute complication.   Electronically Signed   By: Lavonia Dana M.D.   On: 01/03/2014 09:27    ASSESSMENT:  UTI, ? Infection          Active Problems:   Right knee pain   S/P total knee arthroplasty    PLAN:    1) s/p right TKA continue pain control and physical therapy as per protocol 2) UTI - ID consulted, started on IV cefepime 3) labs ordered to check on any other lab abnormalities (hypokalemia) 4) continue workup per ID on positive blood cultures   Dantrell Schertzer 01/18/2014.  9:18 PM

## 2014-01-18 NOTE — Progress Notes (Signed)
ANTIBIOTIC CONSULT NOTE - INITIAL  Pharmacy Consult for Cefepime Indication: UTI / GNR in blood cultures  Allergies  Allergen Reactions  . Sulfa Antibiotics Hives and Swelling  . Ciprofloxacin Itching  . Motrin [Ibuprofen] Palpitations    Patient Measurements: Height: 5' 7.99" (172.7 cm) Weight: 214 lb 11.2 oz (97.387 kg) IBW/kg (Calculated) : 63.88  Vital Signs: Temp: 98.5 F (36.9 C) (11/04 2059) Temp Source: Oral (11/04 2059) BP: 129/74 mmHg (11/04 2059) Pulse Rate: 89 (11/04 2059) Intake/Output from previous day:   Intake/Output from this shift:    Labs:  Recent Labs  01/17/14 0620  WBC 8.1  HGB 13.3  PLT 218  CREATININE 0.65   Estimated Creatinine Clearance: 77.6 mL/min (by C-G formula based on Cr of 0.65). No results for input(s): VANCOTROUGH, VANCOPEAK, VANCORANDOM, GENTTROUGH, GENTPEAK, GENTRANDOM, TOBRATROUGH, TOBRAPEAK, TOBRARND, AMIKACINPEAK, AMIKACINTROU, AMIKACIN in the last 72 hours.   Microbiology: Recent Results (from the past 720 hour(s))  Surgical pcr screen     Status: Abnormal   Collection Time: 12/29/13  3:36 PM  Result Value Ref Range Status   MRSA, PCR NEGATIVE NEGATIVE Final   Staphylococcus aureus POSITIVE (A) NEGATIVE Final    Comment:        The Xpert SA Assay (FDA approved for NASAL specimens in patients over 25 years of age), is one component of a comprehensive surveillance program.  Test performance has been validated by [employer] for patients greater than or equal to 60 year old. It is not intended to diagnose infection nor to guide or monitor treatment.  Urine culture     Status: None   Collection Time: 12/29/13  4:00 PM  Result Value Ref Range Status   Specimen Description URINE, CLEAN CATCH  Final   Special Requests NONE  Final   Culture  Setup Time   Final    12/29/2013 20:39 Performed at [facility]   Final    40,000 COLONIES/ML Performed at [facility]   Culture   Final   Multiple bacterial morphotypes present, none predominant. Suggest appropriate recollection if clinically indicated. Performed at [facility]   Report Status 12/30/2013 FINAL  Final  Culture, Urine     Status: None   Collection Time: 01/04/14  8:48 AM  Result Value Ref Range Status   Specimen Description URINE, CATHETERIZED  Final   Special Requests Normal  Final   Culture  Setup Time   Final    01/04/2014 18:08 Performed at [facility] Performed at [facility]  Final   Culture NO GROWTH Performed at [facility]  Final   Report Status 01/05/2014 FINAL  Final  Culture, blood (routine x 2)     Status: None (Preliminary result)   Collection Time: 01/17/14  6:00 AM  Result Value Ref Range Status   Specimen Description BLOOD RIGHT ARM  Final   Special Requests BOTTLES DRAWN AEROBIC ONLY 10CC  Final   Culture  Setup Time   Final    01/17/2014 13:51 Performed at [facility]    Culture   Final    GRAM NEGATIVE RODS Note: Gram Stain Report Called to,Read Back By and Verified With: LYNN MILLER 01/18/14 1305 BY MARHE Performed at [facility]    Report Status PENDING  Incomplete  Culture, blood (routine x 2)     Status: None (Preliminary result)   Collection Time: 01/17/14  6:10 AM  Result  Value Ref Range Status   Specimen Description BLOOD LEFT ARM  Final   Special Requests BOTTLES DRAWN AEROBIC AND ANAEROBIC 10CC  Final   Culture  Setup Time   Final    01/17/2014 13:52 Performed at [facility]    Culture   Final    GRAM NEGATIVE RODS Note: Gram Stain Report Called to,Read Back By and Verified With: LYNN MILLER 01/18/14 1305 BY MARHE Performed at [facility]    Report Status PENDING  Incomplete  Urine culture     Status: None (Preliminary result)   Collection Time: 01/17/14  8:24 AM  Result Value Ref Range Status   Specimen Description URINE, CATHETERIZED  Final   Special Requests NONE   Final   Culture  Setup Time   Final    01/17/2014 14:02 Performed at [place]   Final    75,000 COLONIES/ML Performed at [facility]    Culture   Final    ESCHERICHIA COLI Performed at [facility]    Report Status PENDING  Incomplete    Medical History: Past Medical History  Diagnosis Date  . Complication of anesthesia     "felt drunk for a week after"  . Hypertension     not being treated with medication  . Palpitations   . Anxiety   . GERD (gastroesophageal reflux disease)   . Arthritis   . Cancer     skin    Assessment: 73 year old female recently discharged after knee procedure.  Asked to return to the hospital for GNR in blood cultures x 2  Goal of Therapy:  Appropriate dosing  Plan:  Cefepime 2 grams iv Q 12 hours Continue to follow  Thank you. Anette Guarneri, PharmD  Tad Moore 01/18/2014,9:13 PM

## 2014-01-19 DIAGNOSIS — A4151 Sepsis due to Escherichia coli [E. coli]: Secondary | ICD-10-CM | POA: Insufficient documentation

## 2014-01-19 DIAGNOSIS — B9620 Unspecified Escherichia coli [E. coli] as the cause of diseases classified elsewhere: Secondary | ICD-10-CM

## 2014-01-19 DIAGNOSIS — R7881 Bacteremia: Secondary | ICD-10-CM | POA: Insufficient documentation

## 2014-01-19 DIAGNOSIS — Z113 Encounter for screening for infections with a predominantly sexual mode of transmission: Secondary | ICD-10-CM

## 2014-01-19 DIAGNOSIS — Z96651 Presence of right artificial knee joint: Secondary | ICD-10-CM

## 2014-01-19 DIAGNOSIS — M25561 Pain in right knee: Secondary | ICD-10-CM

## 2014-01-19 DIAGNOSIS — N390 Urinary tract infection, site not specified: Secondary | ICD-10-CM | POA: Insufficient documentation

## 2014-01-19 LAB — CBC
HEMATOCRIT: 37.8 % (ref 36.0–46.0)
Hemoglobin: 12.7 g/dL (ref 12.0–15.0)
MCH: 31.3 pg (ref 26.0–34.0)
MCHC: 33.6 g/dL (ref 30.0–36.0)
MCV: 93.1 fL (ref 78.0–100.0)
Platelets: 191 10*3/uL (ref 150–400)
RBC: 4.06 MIL/uL (ref 3.87–5.11)
RDW: 12 % (ref 11.5–15.5)
WBC: 6.8 10*3/uL (ref 4.0–10.5)

## 2014-01-19 LAB — BASIC METABOLIC PANEL
ANION GAP: 14 (ref 5–15)
Anion gap: 18 — ABNORMAL HIGH (ref 5–15)
BUN: 5 mg/dL — AB (ref 6–23)
BUN: 6 mg/dL (ref 6–23)
CALCIUM: 8.3 mg/dL — AB (ref 8.4–10.5)
CHLORIDE: 103 meq/L (ref 96–112)
CO2: 21 mEq/L (ref 19–32)
CO2: 24 mEq/L (ref 19–32)
Calcium: 8.3 mg/dL — ABNORMAL LOW (ref 8.4–10.5)
Chloride: 102 mEq/L (ref 96–112)
Creatinine, Ser: 0.59 mg/dL (ref 0.50–1.10)
Creatinine, Ser: 0.69 mg/dL (ref 0.50–1.10)
GFR, EST NON AFRICAN AMERICAN: 89 mL/min — AB (ref >90)
GFR, EST NON AFRICAN AMERICAN: 85 mL/min — AB (ref >90)
Glucose, Bld: 107 mg/dL — ABNORMAL HIGH (ref 70–99)
Glucose, Bld: 121 mg/dL — ABNORMAL HIGH (ref 70–99)
POTASSIUM: 2.7 meq/L — AB (ref 3.7–5.3)
Potassium: 3 mEq/L — ABNORMAL LOW (ref 3.7–5.3)
Sodium: 141 mEq/L (ref 137–147)
Sodium: 141 mEq/L (ref 137–147)

## 2014-01-19 LAB — URINE CULTURE: Colony Count: 75000

## 2014-01-19 LAB — SEDIMENTATION RATE: Sed Rate: 34 mm/hr — ABNORMAL HIGH (ref 0–22)

## 2014-01-19 LAB — C-REACTIVE PROTEIN: CRP: 16.1 mg/dL — AB (ref <0.60)

## 2014-01-19 LAB — CLOSTRIDIUM DIFFICILE BY PCR: Toxigenic C. Difficile by PCR: NEGATIVE

## 2014-01-19 MED ORDER — POTASSIUM CHLORIDE CRYS ER 20 MEQ PO TBCR
40.0000 meq | EXTENDED_RELEASE_TABLET | Freq: Two times a day (BID) | ORAL | Status: DC
  Administered 2014-01-19 – 2014-01-20 (×3): 40 meq via ORAL
  Filled 2014-01-19 (×5): qty 2

## 2014-01-19 MED ORDER — ONDANSETRON HCL 4 MG PO TABS
4.0000 mg | ORAL_TABLET | Freq: Three times a day (TID) | ORAL | Status: DC | PRN
  Administered 2014-01-19: 4 mg via ORAL
  Administered 2014-01-20: 8 mg via ORAL
  Filled 2014-01-19: qty 2
  Filled 2014-01-19: qty 1

## 2014-01-19 MED ORDER — LOPERAMIDE HCL 2 MG PO CAPS
2.0000 mg | ORAL_CAPSULE | Freq: Once | ORAL | Status: AC
  Administered 2014-01-19: 2 mg via ORAL
  Filled 2014-01-19: qty 1

## 2014-01-19 MED ORDER — POTASSIUM CHLORIDE CRYS ER 20 MEQ PO TBCR
20.0000 meq | EXTENDED_RELEASE_TABLET | Freq: Once | ORAL | Status: AC
  Administered 2014-01-19: 20 meq via ORAL
  Filled 2014-01-19 (×2): qty 1

## 2014-01-19 MED ORDER — POTASSIUM CHLORIDE 10 MEQ/100ML IV SOLN
10.0000 meq | Freq: Once | INTRAVENOUS | Status: AC
  Administered 2014-01-19: 10 meq via INTRAVENOUS
  Filled 2014-01-19: qty 100

## 2014-01-19 MED ORDER — DEXTROSE 5 % IV SOLN
2.0000 g | INTRAVENOUS | Status: DC
  Administered 2014-01-19: 2 g via INTRAVENOUS
  Filled 2014-01-19 (×2): qty 2

## 2014-01-19 MED ORDER — ONDANSETRON HCL 4 MG/2ML IJ SOLN: 4.0000 mg | Freq: Three times a day (TID) | INTRAMUSCULAR | Status: DC | PRN

## 2014-01-19 NOTE — Progress Notes (Signed)
Talked to the [place], [place] of Denise Kelly about the critical value of potassium, he said that Linton Rump is doing her rounds in the hospital and she will be taking care of it.

## 2014-01-19 NOTE — Plan of Care (Signed)
Problem: Phase I Progression Outcomes Goal: OOB as tolerated unless otherwise ordered Outcome: Completed/Met Date Met:  01/19/14     

## 2014-01-19 NOTE — Progress Notes (Signed)
Utilization review completed. Siyon Linck, RN, BSN. 

## 2014-01-19 NOTE — Consult Note (Signed)
[facility] for Infectious Disease    Date of Admission:  01/18/2014  Date of Consult:  01/19/2014  Reason for Consult: complicated E coli UTI with bacteremia in patient with recent TKA R Referring Physician: Dr. Ronnie Derby   HPI: Denise Kelly is an 73 y.o. female. Who had TKA on 01/02/14. She had come to the ED on the 3rd with confusion initially attributed to her pain meds. Pts staples removed in ED and pt seemed staable and she was DC from ED. Since then urine and blood cultures from ED growing E coli. She was readmitted to manage her E coli bacteremia.   Past Medical History  Diagnosis Date  . Complication of anesthesia     "felt drunk for a week after"  . Hypertension     not being treated with medication  . Palpitations   . Anxiety   . GERD (gastroesophageal reflux disease)   . Arthritis   . Cancer     skin  . Alcohol abuse     ETOH and xanax    Past Surgical History  Procedure Laterality Date  . Tonsillectomy    . Tubal ligation    . 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 Breast surgery      2 breast biopsies  . Eye surgery Bilateral     cataracts  . Colonoscopy    . Total knee arthroplasty Right 01/02/2014    Dr Ronnie Derby  . Total knee arthroplasty Right 01/02/2014    Procedure: TOTAL KNEE ARTHROPLASTY;  Surgeon: [name], MD;  Location: [facility];  Service: Orthopedics;  Laterality: Right;  . Joint replacement Left 09/2009    knee  . Joint replacement Right 01/02/2014  ergies:   Allergies  Allergen Reactions  . Sulfa Antibiotics Hives and Swelling  . Ciprofloxacin Itching  . Motrin [Ibuprofen] Palpitations     Medications: I have reviewed patients current medications as documented in Epic Anti-infectives    Start     Dose/Rate Route Frequency Ordered Stop   01/19/14 1315  cefTRIAXone (ROCEPHIN) 2 g in dextrose 5 % 50 mL IVPB     2 g100 mL/hr over 30 Minutes Intravenous Every 24 hours 01/19/14 1313     01/18/14 2200  ceFEPIme (MAXIPIME) 2 g in  dextrose 5 % 50 mL IVPB  Status:  Discontinued     2 g100 mL/hr over 30 Minutes Intravenous Every 12 hours 01/18/14 2115 01/19/14 1313      Social History:  reports that she has quit smoking. She has never used smokeless tobacco. She reports that she drinks alcohol. She reports that she does not use illicit drugs.  History reviewed. No pertinent family history.  As in HPI and primary teams notes otherwise 12 point review of systems is negative  Blood pressure 168/81, pulse 88, temperature 98.6 F (37 C), temperature source Oral, resp. rate 18, height 5' 7.99" (1.727 m), weight 214 lb 11.2 oz (97.387 kg), SpO2 98 %. General: Alert and awake, oriented x3, having trouble with word finding and recent events HEENT: anicteric sclera,EOMI, oropharynx clear and without exudate CVS regular rate, normal r,  no murmur rubs or gallops Chest: clear to auscultation bilaterally, no wheezing, rales or rhonchi Abdomen: soft nontender, nondistended, normal bowel sounds, Extremities: Right knee wound appears to be doing well:      Neuro: nonfocal, strength and sensation intact   Results for orders placed or performed during the hospital encounter of 01/18/14 (from  the past 48 hour(s))  Protime-INR     Status: Abnormal   Collection Time: 01/18/14  9:27 PM  Result Value Ref Range   Prothrombin Time 15.7 (H) 11.6 - 15.2 seconds   INR 1.24 0.00 - 1.49  CBC WITH DIFFERENTIAL     Status: Abnormal   Collection Time: 01/18/14  9:27 PM  Result Value Ref Range   WBC 6.6 4.0 - 10.5 K/uL   RBC 4.02 3.87 - 5.11 MIL/uL   Hemoglobin 12.6 12.0 - 15.0 g/dL   HCT 36.5 36.0 - 46.0 %   MCV 90.8 78.0 - 100.0 fL   MCH 31.3 26.0 - 34.0 pg   MCHC 34.5 30.0 - 36.0 g/dL   RDW 12.0 11.5 - 15.5 %   Platelets 187 150 - 400 K/uL   Neutrophils Relative % 70 43 - 77 %   Neutro Abs 4.6 1.7 - 7.7 K/uL   Lymphocytes Relative 16 12 - 46 %   Lymphs Abs 1.1 0.7 - 4.0 K/uL   Monocytes Relative 13 (H) 3 - 12 %   Monocytes  Absolute 0.9 0.1 - 1.0 K/uL   Eosinophils Relative 1 0 - 5 %   Eosinophils Absolute 0.1 0.0 - 0.7 K/uL   Basophils Relative 0 0 - 1 %   Basophils Absolute 0.0 0.0 - 0.1 K/uL  Basic metabolic panel     Status: Abnormal   Collection Time: 01/18/14  9:27 PM  Result Value Ref Range   Sodium 137 137 - 147 mEq/L   Potassium 2.7 (LL) 3.7 - 5.3 mEq/L    Comment: CRITICAL RESULT CALLED TO, READ BACK BY AND VERIFIED WITH: Elmon Else [id number] M SHIPMAN    Chloride 97 96 - 112 mEq/L   CO2 26 19 - 32 mEq/L   Glucose, Bld 113 (H) 70 - 99 mg/dL   BUN 6 6 - 23 mg/dL   Creatinine, Ser 0.65 0.50 - 1.10 mg/dL   Calcium 8.5 8.4 - 10.5 mg/dL   GFR calc non Af Amer 87 (L) >90 mL/min   GFR calc Af Amer >90 >90 mL/min    Comment: (NOTE) The eGFR has been calculated using the CKD EPI equation. This calculation has not been validated in all clinical situations. eGFR's persistently <90 mL/min signify possible Chronic Kidney Disease.    Anion gap 14 5 - 15  APTT     Status: Abnormal   Collection Time: 01/18/14  9:27 PM  Result Value Ref Range   aPTT 39 (H) 24 - 37 seconds    Comment:        IF BASELINE aPTT IS ELEVATED, SUGGEST PATIENT RISK ASSESSMENT BE USED TO DETERMINE APPROPRIATE ANTICOAGULANT THERAPY.   Clostridium Difficile by PCR     Status: None   Collection Time: 01/19/14  3:11 AM  Result Value Ref Range   C difficile by pcr NEGATIVE NEGATIVE  CBC     Status: None   Collection Time: 01/19/14  6:30 AM  Result Value Ref Range   WBC 6.8 4.0 - 10.5 K/uL   RBC 4.06 3.87 - 5.11 MIL/uL   Hemoglobin 12.7 12.0 - 15.0 g/dL   HCT 37.8 36.0 - 46.0 %   MCV 93.1 78.0 - 100.0 fL   MCH 31.3 26.0 - 34.0 pg   MCHC 33.6 30.0 - 36.0 g/dL   RDW 12.0 11.5 - 15.5 %   Platelets 191 150 - 400 K/uL  Basic metabolic panel     Status: Abnormal  Collection Time: 01/19/14  6:30 AM  Result Value Ref Range   Sodium 141 137 - 147 mEq/L   Potassium 2.7 (LL) 3.7 - 5.3 mEq/L    Comment: CRITICAL RESULT  CALLED TO, READ BACK BY AND VERIFIED WITH: J.NARAMBAS,RN 01/19/14 0474 BY BSLADE    Chloride 102 96 - 112 mEq/L   CO2 21 19 - 32 mEq/L   Glucose, Bld 107 (H) 70 - 99 mg/dL   BUN 5 (L) 6 - 23 mg/dL   Creatinine, Ser 0.59 0.50 - 1.10 mg/dL   Calcium 8.3 (L) 8.4 - 10.5 mg/dL   GFR calc non Af Amer 89 (L) >90 mL/min   GFR calc Af Amer >90 >90 mL/min    Comment: (NOTE) The eGFR has been calculated using the CKD EPI equation. This calculation has not been validated in all clinical situations. eGFR's persistently <90 mL/min signify possible Chronic Kidney Disease.    Anion gap 18 (H) 5 - 15  C-reactive protein     Status: Abnormal   Collection Time: 01/19/14  6:30 AM  Result Value Ref Range   CRP 16.1 (H) <0.60 mg/dL    Comment: Performed at [facility]  Sedimentation rate     Status: Abnormal   Collection Time: 01/19/14  6:30 AM  Result Value Ref Range   Sed Rate 34 (H) 0 - 22 mm/hr   '@BRIEFLABTABLE' (sdes,specrequest,cult,reptstatus)   ) Recent Results (from the past 720 hour(s))  Surgical pcr screen     Status: Abnormal   Collection Time: 12/29/13  3:36 PM  Result Value Ref Range Status   MRSA, PCR NEGATIVE NEGATIVE Final   Staphylococcus aureus POSITIVE (A) NEGATIVE Final    Comment:        The Xpert SA Assay (FDA approved for NASAL specimens in patients over 49 years of age), is one component of a comprehensive surveillance program.  Test performance has been validated by [employer] for patients greater than or equal to 85 year old. It is not intended to diagnose infection nor to guide or monitor treatment.  Urine culture     Status: None   Collection Time: 12/29/13  4:00 PM  Result Value Ref Range Status   Specimen Description URINE, CLEAN CATCH  Final   Special Requests NONE  Final   Culture  Setup Time   Final    12/29/2013 20:39 Performed at [facility]   Final    40,000 COLONIES/ML Performed at [facility]    Culture   Final    Multiple bacterial morphotypes present, none predominant. Suggest appropriate recollection if clinically indicated. Performed at [facility]   Report Status 12/30/2013 FINAL  Final  Culture, Urine     Status: None   Collection Time: 01/04/14  8:48 AM  Result Value Ref Range Status   Specimen Description URINE, CATHETERIZED  Final   Special Requests Normal  Final   Culture  Setup Time   Final    01/04/2014 18:08 Performed at [place] Performed at [facility]  Final   Culture NO GROWTH Performed at [facility]  Final   Report Status 01/05/2014 FINAL  Final  Culture, blood (routine x 2)     Status: None (Preliminary result)   Collection Time: 01/17/14  6:00 AM  Result Value Ref Range Status   Specimen Description BLOOD RIGHT ARM  Final   Special Requests BOTTLES DRAWN AEROBIC ONLY  10CC  Final   Culture  Setup Time   Final    01/17/2014 13:51 Performed at [facility]    Culture   Final    ESCHERICHIA COLI Note: Gram Stain Report Called to,Read Back By and Verified With: LYNN MILLER 01/18/14 1305 BY MARHE Performed at [facility]    Report Status PENDING  Incomplete  Culture, blood (routine x 2)     Status: None (Preliminary result)   Collection Time: 01/17/14  6:10 AM  Result Value Ref Range Status   Specimen Description BLOOD LEFT ARM  Final   Special Requests BOTTLES DRAWN AEROBIC AND ANAEROBIC 10CC  Final   Culture  Setup Time   Final    01/17/2014 13:52 Performed at [facility]    Culture   Final    ESCHERICHIA COLI Note: Gram Stain Report Called to,Read Back By and Verified With: LYNN MILLER 01/18/14 1305 BY MARHE Performed at [facility]    Report Status PENDING  Incomplete  Urine culture     Status: None   Collection Time: 01/17/14  8:24 AM  Result Value Ref Range Status   Specimen Description URINE, CATHETERIZED  Final   Special Requests NONE   Final   Culture  Setup Time   Final    01/17/2014 14:02 Performed at [facility]   Final    75,000 COLONIES/ML Performed at [facility]    Culture   Final    ESCHERICHIA COLI Performed at [facility]    Report Status 01/19/2014 FINAL  Final   Organism ID, Bacteria ESCHERICHIA COLI  Final      Susceptibility   Escherichia coli - MIC*    AMPICILLIN 8 SENSITIVE Sensitive     CEFAZOLIN <=4 SENSITIVE Sensitive     CEFTRIAXONE <=1 SENSITIVE Sensitive     CIPROFLOXACIN <=0.25 SENSITIVE Sensitive     GENTAMICIN <=1 SENSITIVE Sensitive     LEVOFLOXACIN <=0.12 SENSITIVE Sensitive     NITROFURANTOIN <=16 SENSITIVE Sensitive     TOBRAMYCIN <=1 SENSITIVE Sensitive     TRIMETH/SULFA <=20 SENSITIVE Sensitive     PIP/TAZO <=4 SENSITIVE Sensitive     * ESCHERICHIA COLI  Clostridium Difficile by PCR     Status: None   Collection Time: 01/19/14  3:11 AM  Result Value Ref Range Status   C difficile by pcr NEGATIVE NEGATIVE Final     Impression/Recommendation  Active Problems:   Right knee pain   S/P total knee arthroplasty   ESTALENE BERGEY is a 73 y.o. female with  Recent TKA on 01/02/14 with admission with complicated E coli UTI with bacteremia  #1 Complicated UTI with E coli and bacteremia  --continue IV rocephin 2 grams daily for now --repeat blood cultures to ensure clearance of the organism  MAKE SURE ORGANISM IN BLOOD WITH SAME SENSIS AS THAT IN THE URINE  #2 Screening: will screen for HIV and viral hepatitis  Dr. Linus Salmons is covering for me tomorrow and Dr. Megan Salon will be here this weekend. I will be back on Monday.   01/19/2014, 5:57 PM   Thank you so much for this interesting consult  [facility] [phone] (pager) [phone] (office) 01/19/2014, 5:57 PM  Rhina [place] 01/19/2014, 5:57 PM

## 2014-01-19 NOTE — Progress Notes (Addendum)
SPORTS MEDICINE AND JOINT REPLACEMENT  Denise Mulch, MD   Carlynn Spry, PA-C [address], [place], [place]  [address]                             [phone]   PROGRESS NOTE  Subjective:  negative for Chest Pain  negative for Shortness of Breath  negative for Nausea/Vomiting   negative for Calf Pain  negative for Bowel Movement   Tolerating Diet: yes         Patient reports pain as 4 on 0-10 scale.    Objective: Vital signs in last 24 hours:   Patient Vitals for the past 24 hrs:  BP Temp Temp src Pulse Resp SpO2 Height Weight  01/19/14 1600 - - - - 18 98 % - -  01/19/14 1150 - - - - 18 96 % - -  01/19/14 0935 (!) 168/81 mmHg 98.6 F (37 C) Oral 88 18 98 % - -  01/19/14 0800 - - - - 16 97 % - -  01/19/14 0646 (!) 161/89 mmHg 98.6 F (37 C) Oral 92 16 96 % - -  01/18/14 2059 129/74 mmHg 98.5 F (36.9 C) Oral 89 16 97 % 5' 7.99" (1.727 m) 97.387 kg (214 lb 11.2 oz)    @flow {1959:LAST@   Intake/Output from previous day:       Intake/Output this shift:   11/05 0701 - 11/05 1900 In: 480 [P.O.:480] Out: -    Intake/Output      11/04 0701 - 11/05 0700 11/05 0701 - 11/06 0700   P.O.  480   Total Intake(mL/kg)  480 (4.9)   Net   +480        Urine Occurrence 6 x 2 x   Stool Occurrence 7 x 1 x      LABORATORY DATA:  Recent Labs  01/17/14 0620 01/18/14 2127 01/19/14 0630  WBC 8.1 6.6 6.8  HGB 13.3 12.6 12.7  HCT 39.0 36.5 37.8  PLT 218 187 191    Recent Labs  01/17/14 0620 01/18/14 2127 01/19/14 0630  NA 141 137 141  K 2.8* 2.7* 2.7*  CL 99 97 102  CO2 24 26 21   BUN 3* 6 5*  CREATININE 0.65 0.65 0.59  GLUCOSE 147* 113* 107*  CALCIUM 8.6 8.5 8.3*   Lab Results  Component Value Date   INR 1.24 01/18/2014   INR 1.16 12/29/2013   INR 0.97 09/20/2009    Examination:  General appearance: alert, cooperative and no distress Extremities: Homans sign is negative, no sign of DVT  Wound Exam: clean, dry, intact   Drainage:  None: wound  tissue dry  Motor Exam: EHL and FHL Intact  Sensory Exam: Deep Peroneal normal   Assessment:          ADDITIONAL DIAGNOSIS:  Active Problems:   Right knee pain   S/P total knee arthroplasty  Acute blood loss - chosen in error, please disregard   Plan: Physical Therapy as ordered Weight Bearing as Tolerated (WBAT)  DVT Prophylaxis:  None  DISCHARGE PLAN: Home  Hypokalemia - cont to get K  Awaiting ID orders for discharge         Vikas Wegmann 01/19/2014, 4:50 PM2

## 2014-01-19 NOTE — Progress Notes (Signed)
PT Cancellation Note  Patient Details Name: ELEANORE JUNIO MRN: [id number] DOB: May 30, 1940   Cancelled Treatment:    Reason Eval/Treat Not Completed: Medical issues which prohibited therapy. Pt with current potassium level at 2.7 mEq/L, and no further potassium supplementation has been provided at this time. Will continue to check back as schedule allows to complete PT eval.    Rolinda Roan 01/19/2014, 8:54 AM  Rolinda Roan, PT, DPT Acute Rehabilitation Services Pager: [phone]

## 2014-01-19 NOTE — Progress Notes (Signed)
CRITICAL VALUE ALERT  Critical value received:  Potassium 2.7 Date of notification:  01/19/14  Time of notification:  7:50 am Critical value read back:Yes.    Nurse who received alert:  Alden Server  MD notified (1st page):  Dr. Ronnie Derby  Time of first page:  7:57 am  MD notified (2nd page):  Time of second page:  Responding MD:  Beryl Meager, PA Time MD responded:  8:14 am

## 2014-01-19 NOTE — Plan of Care (Signed)
Problem: Phase I Progression Outcomes Goal: Tolerating diet Outcome: Completed/Met Date Met:  01/19/14     

## 2014-01-19 NOTE — Progress Notes (Signed)
Orthopedic Tech Progress Note Patient Details:  Denise Kelly 15-Sep-1940 [id number]  Patient ID: [name], female   DOB: 1940/08/25, 73 y.o.   MRN: [id number] RN stated that pt is unable to use trapeze bar patient helper  Hildred Priest 01/19/2014, 10:36 AM

## 2014-01-19 NOTE — Plan of Care (Signed)
Problem: Phase I Progression Outcomes Goal: Pain controlled with appropriate interventions Outcome: Completed/Met Date Met:  01/19/14

## 2014-01-20 LAB — CULTURE, BLOOD (ROUTINE X 2)

## 2014-01-20 LAB — BASIC METABOLIC PANEL
ANION GAP: 16 — AB (ref 5–15)
BUN: 5 mg/dL — ABNORMAL LOW (ref 6–23)
CALCIUM: 8.3 mg/dL — AB (ref 8.4–10.5)
CO2: 22 meq/L (ref 19–32)
Chloride: 105 mEq/L (ref 96–112)
Creatinine, Ser: 0.64 mg/dL (ref 0.50–1.10)
GFR calc Af Amer: >90 mL/min (ref >90)
GFR, EST NON AFRICAN AMERICAN: 87 mL/min — AB (ref >90)
Glucose, Bld: 111 mg/dL — ABNORMAL HIGH (ref 70–99)
Potassium: 3.1 mEq/L — ABNORMAL LOW (ref 3.7–5.3)
SODIUM: 143 meq/L (ref 137–147)

## 2014-01-20 LAB — HEPATITIS PANEL, ACUTE
HCV Ab: NEGATIVE
HEP B C IGM: NONREACTIVE
Hep A IgM: NONREACTIVE
Hepatitis B Surface Ag: NEGATIVE

## 2014-01-20 MED ORDER — AMOXICILLIN 500 MG PO TABS: 500.0000 mg | ORAL_TABLET | Freq: Three times a day (TID) | ORAL | Status: DC

## 2014-01-20 MED ORDER — POTASSIUM CHLORIDE CRYS ER 20 MEQ PO TBCR: 20.0000 meq | EXTENDED_RELEASE_TABLET | Freq: Two times a day (BID) | ORAL | Status: DC

## 2014-01-20 NOTE — Plan of Care (Signed)
Problem: Discharge Progression Outcomes Goal: Pain controlled with appropriate interventions Outcome: Completed/Met Date Met:  01/20/14 Goal: Tolerating diet Outcome: Completed/Met Date Met:  01/20/14 Goal: Temperature < 100 x 24 hrs on PO antibiotics Outcome: Completed/Met Date Met:  01/20/14 Goal: Other Discharge Outcomes/Goals Outcome: Not Applicable Date Met:  80/01/23

## 2014-01-20 NOTE — Plan of Care (Signed)
Problem: Consults Goal: Skin Care Protocol Initiated - if Braden Score 18 or less If consults are not indicated, leave blank or document N/A  Outcome: Not Applicable Date Met:  36/64/40

## 2014-01-20 NOTE — Plan of Care (Signed)
Problem: Consults Goal: UTI/Pyelonephritis Patient Education See Patient Education Module for education specifics.  Outcome: Completed/Met Date Met:  01/20/14

## 2014-01-20 NOTE — Plan of Care (Signed)
Problem: Phase II Progression Outcomes Goal: Progress activity as tolerated unless otherwise ordered Outcome: Completed/Met Date Met:  01/20/14 Goal: Vital signs remain stable, temperature < 100 Outcome: Completed/Met Date Met:  01/20/14 Goal: Tolerating diet Outcome: Completed/Met Date Met:  01/20/14 Goal: Voiding independently Outcome: Completed/Met Date Met:  01/20/14 Goal: Other Phase II Outcomes/Goals Outcome: Not Applicable Date Met:  01/20/14     

## 2014-01-20 NOTE — Plan of Care (Signed)
Problem: Phase I Progression Outcomes Goal: Initial discharge plan identified Outcome: Completed/Met Date Met:  01/20/14     

## 2014-01-20 NOTE — Clinical Documentation Improvement (Signed)
Please provide clinical indicators to support acute blood loss anemia diagnosis if appropriate.  Per 01/19/14 MD progress notes patient has acute blood loss anemia.  Patient's labs during this admission  Component     Latest Ref Rng 01/18/2014 01/19/2014          6:30 AM  Hemoglobin     12.0 - 15.0 g/dL 12.6 12.7  HCT     36.0 - 46.0 % 36.5 37.8      Thank You, Rozetta Nunnery, BSN, CCM Clinical Documentation Specialist Phone: [phone]

## 2014-01-20 NOTE — Progress Notes (Signed)
[facility] for Infectious Disease  Date of Admission:  01/18/2014  Antibiotics: ceftriaxone  Subjective: Feels much better  Objective: Temp:  [98 F (36.7 C)-99.3 F (37.4 C)] 98.6 F (37 C) (11/06 0900) Pulse Rate:  [83-91] 83 (11/06 0900) Resp:  [17-18] 17 (11/06 0900) BP: (134-166)/(78-85) 134/78 mmHg (11/06 0900) SpO2:  [95 %-98 %] 95 % (11/06 0900) Weight:  [217 lb 4.9 oz (98.57 kg)] 217 lb 4.9 oz (98.57 kg) (11/05 2118)  General: Awake, in bed, nad Skin: no rashes Lungs: CTA B Cor: RRR   Lab Results Lab Results  Component Value Date   WBC 6.8 01/19/2014   HGB 12.7 01/19/2014   HCT 37.8 01/19/2014   MCV 93.1 01/19/2014   PLT 191 01/19/2014    Lab Results  Component Value Date   CREATININE 0.64 01/20/2014   BUN 5* 01/20/2014   NA 143 01/20/2014   K 3.1* 01/20/2014   CL 105 01/20/2014   CO2 22 01/20/2014    Lab Results  Component Value Date   ALT 14 01/17/2014   AST 22 01/17/2014   ALKPHOS 126* 01/17/2014   BILITOT 1.4* 01/17/2014      Microbiology: Recent Results (from the past 240 hour(s))  Culture, blood (routine x 2)     Status: None   Collection Time: 01/17/14  6:00 AM  Result Value Ref Range Status   Specimen Description BLOOD RIGHT ARM  Final   Special Requests BOTTLES DRAWN AEROBIC ONLY 10CC  Final   Culture  Setup Time   Final    01/17/2014 13:51 Performed at [facility]    Culture   Final    ESCHERICHIA COLI Note: Gram Stain Report Called to,Read Back By and Verified With: LYNN MILLER 01/18/14 1305 BY MARHE Performed at [facility]    Report Status 01/20/2014 FINAL  Final   Organism ID, Bacteria ESCHERICHIA COLI  Final      Susceptibility   Escherichia coli - MIC*    AMPICILLIN 4 SENSITIVE Sensitive     AMPICILLIN/SULBACTAM 4 SENSITIVE Sensitive     CEFAZOLIN <=4 SENSITIVE Sensitive     CEFEPIME <=1 SENSITIVE Sensitive     CEFTAZIDIME <=1 SENSITIVE Sensitive     CEFTRIAXONE <=1 SENSITIVE Sensitive    CIPROFLOXACIN <=0.25 SENSITIVE Sensitive     GENTAMICIN <=1 SENSITIVE Sensitive     IMIPENEM <=0.25 SENSITIVE Sensitive     PIP/TAZO <=4 SENSITIVE Sensitive     TOBRAMYCIN <=1 SENSITIVE Sensitive     TRIMETH/SULFA <=20 SENSITIVE Sensitive     * ESCHERICHIA COLI  Culture, blood (routine x 2)     Status: None   Collection Time: 01/17/14  6:10 AM  Result Value Ref Range Status   Specimen Description BLOOD LEFT ARM  Final   Special Requests BOTTLES DRAWN AEROBIC AND ANAEROBIC 10CC  Final   Culture  Setup Time   Final    01/17/2014 13:52 Performed at [facility]    Culture   Final    ESCHERICHIA COLI Note: SUSCEPTIBILITIES PERFORMED ON PREVIOUS CULTURE WITHIN THE LAST 5 DAYS. Note: Gram Stain Report Called to,Read Back By and Verified With: LYNN MILLER 01/18/14 1305 BY MARHE Performed at [facility]    Report Status 01/20/2014 FINAL  Final  Urine culture     Status: None   Collection Time: 01/17/14  8:24 AM  Result Value Ref Range Status   Specimen Description URINE, CATHETERIZED  Final   Special Requests NONE  Final  Culture  Setup Time   Final    01/17/2014 14:02 Performed at [facility] [facility]   Final    75,000 COLONIES/ML Performed at [facility]    Culture   Final    ESCHERICHIA COLI Performed at [facility]    Report Status 01/19/2014 FINAL  Final   Organism ID, Bacteria ESCHERICHIA COLI  Final      Susceptibility   Escherichia coli - MIC*    AMPICILLIN 8 SENSITIVE Sensitive     CEFAZOLIN <=4 SENSITIVE Sensitive     CEFTRIAXONE <=1 SENSITIVE Sensitive     CIPROFLOXACIN <=0.25 SENSITIVE Sensitive     GENTAMICIN <=1 SENSITIVE Sensitive     LEVOFLOXACIN <=0.12 SENSITIVE Sensitive     NITROFURANTOIN <=16 SENSITIVE Sensitive     TOBRAMYCIN <=1 SENSITIVE Sensitive     TRIMETH/SULFA <=20 SENSITIVE Sensitive     PIP/TAZO <=4 SENSITIVE Sensitive     * ESCHERICHIA COLI  Clostridium Difficile by PCR     Status: None    Collection Time: 01/19/14  3:11 AM  Result Value Ref Range Status   C difficile by pcr NEGATIVE NEGATIVE Final  Culture, blood (routine x 2)     Status: None (Preliminary result)   Collection Time: 01/19/14 10:21 AM  Result Value Ref Range Status   Specimen Description BLOOD LEFT ANTECUBITAL  Final   Special Requests BOTTLES DRAWN AEROBIC AND ANAEROBIC 10CC  Final   Culture  Setup Time   Final    01/19/2014 15:30 Performed at [facility]    Culture   Final           BLOOD CULTURE RECEIVED NO GROWTH TO DATE CULTURE WILL BE HELD FOR 5 DAYS BEFORE ISSUING A FINAL NEGATIVE REPORT Performed at [facility]    Report Status PENDING  Incomplete  Culture, blood (routine x 2)     Status: None (Preliminary result)   Collection Time: 01/19/14 10:26 AM  Result Value Ref Range Status   Specimen Description BLOOD LEFT ARM  Final   Special Requests BOTTLES DRAWN AEROBIC ONLY 5CC  Final   Culture  Setup Time   Final    01/19/2014 15:30 Performed at [facility]    Culture   Final           BLOOD CULTURE RECEIVED NO GROWTH TO DATE CULTURE WILL BE HELD FOR 5 DAYS BEFORE ISSUING A FINAL NEGATIVE REPORT Performed at [facility]    Report Status PENDING  Incomplete    Studies/Results: No results found.  Assessment/Plan: 1) E coli complicated UTI - pansensitive.  Allergies noted.  Recommend amoxicillin 500 mg three times a day for another 10 days.   Thanks for consult.    Scharlene Gloss, [place] for Infectious Disease [place] [email or web address] [phone] pager   [phone] cell 01/20/2014, 10:36 AM

## 2014-01-20 NOTE — Plan of Care (Signed)
Problem: Phase I Progression Outcomes Goal: Voiding-avoid urinary catheter unless indicated Outcome: Not Applicable Date Met:  94/70/96 Goal: Adequate I & O Outcome: Completed/Met Date Met:  01/20/14 Goal: Hemodynamically stable Outcome: Completed/Met Date Met:  01/20/14 Goal: Other Phase I Outcomes/Goals Outcome: Not Applicable Date Met:  28/36/62

## 2014-01-20 NOTE — Plan of Care (Signed)
Problem: Phase III Progression Outcomes Goal: Pain controlled on oral analgesia Outcome: Completed/Met Date Met:  01/20/14 Goal: Activity at appropriate level-compared to baseline (UP IN CHAIR FOR HEMODIALYSIS)  Outcome: Not Applicable Date Met:  30/05/11 Goal: Afebrile, Vital Signs remain stable Outcome: Completed/Met Date Met:  01/20/14 Goal: Tolerating diet Outcome: Completed/Met Date Met:  01/20/14 Goal: IV Medications to PO Outcome: Completed/Met Date Met:  01/20/14 Goal: Other Phase III Outcomes/Goals Outcome: Not Applicable Date Met:  04/28/15

## 2014-01-20 NOTE — Plan of Care (Signed)
Problem: Consults Goal: Nutrition Consult-if indicated Outcome: Not Applicable Date Met:  43/27/61

## 2014-01-20 NOTE — Clinical Documentation Improvement (Signed)
Please clarify the diagnosis related to the below clinical indicators if appropriate.     Possible Clinical Condition Sepsis due to E. Coli UTI secondary to E. Coli Septicemia / Sepsis Severe Sepsis Sepsis with UTI Other Condition  Cannot clinically Determine   Clinical Indicators Patient s/p R TKR on 01/02/14.   BC positive for Ecoli.    Per 01/20/14 MD progress note = E coli complicated UTI - pansensitive.  Per 01/19/14 MD Consult note = complicated E coli UTI with bacteremia in patient with recent TKA R.   Pts staples removed in ED and pt seemed staable and she was DC from ED. Since then urine and blood cultures from ED growing E coli. She was readmitted to manage her E coli bacteremia.  Per 01/18/14 H&P = ? Infection, UTI - ID consulted, started on IV cefepime  Per Hospital Problem list = Sepsis due to Escherichia coli.   Present on admission unknown.    Thank You, Serena Colonel ,RN Clinical Documentation Specialist:  [facility] [employer]

## 2014-01-20 NOTE — Plan of Care (Signed)
Problem: Consults Goal: Diabetes Guidelines if Diabetic/Glucose > 140 If diabetic or lab glucose is > 140 mg/dl - Initiate Diabetes/Hyperglycemia Guidelines & Document Interventions  Outcome: Not Applicable Date Met:  49/44/96

## 2014-01-20 NOTE — Discharge Summary (Signed)
[facility]   Lara Mulch, MD   Carlynn Spry, PA-C [address], [place], [place]  [address]                             [phone]  PATIENT ID: [name]        MRN:  [id number]          DOB/AGE: [birth year] / 73 y.o.    DISCHARGE SUMMARY  ADMISSION DATE:    01/18/2014 DISCHARGE DATE:   01/20/2014   ADMISSION DIAGNOSIS: UTI    DISCHARGE DIAGNOSIS:  * No surgery found *    ADDITIONAL DIAGNOSIS: Active Problems:   Right knee pain   S/P total knee arthroplasty   Complicated UTI (urinary tract infection)   E coli bacteremia   Sepsis due to Escherichia coli   Screen for STD (sexually transmitted disease)  Past Medical History  Diagnosis Date  . Complication of anesthesia     "felt drunk for a week after"  . Hypertension     not being treated with medication  . Palpitations   . Anxiety   . GERD (gastroesophageal reflux disease)   . Arthritis   . Cancer     skin  . Alcohol abuse     ETOH and xanax      CONSULTS:   Infectious Disease  HISTORY: Denise Asbill Johnstonis a 73 y.o. female s/p right total knee presented to the ED 01/17/14 c/o confusion and pain in right total knee. Labs were run showing hypokalemia, increased fever and pain from right total knee replacement. Labs were drawn and ortho was consulted. Dr. Ronnie Derby examined the patient's knee which showed no signs of infection and staples were removed from the incision. Patient seemed at baseline behaviorally and was d/c after cultures were sent to the lab. Patient was afebrile at the time of discharge. Blood cultures came back positive for gram negative bacteria and positive for e.coli in urine. Patient was afebrile on 01/18/14 on report from daughter who is the primary caregiver however, pain was uncontrolled and patient was still experiencing UTI symptoms. ID was consulted on antibiotic choices and cipro was recommended. Patient has an allergy to cipro and has continued to not function  well in her home environment. Patient was direct admitted to the hospital for further evaluation, UTI treatment and further infectious workup by ID.  HOSPITAL COURSE: Patient was admitted on 01/18/14 for e.coli in urine and was subsequently found to be hypokalemic. ID consult was made and patient was empirically started on IV antibiotics as well as IV and PO potassium.  Anti-infectives    Start     Dose/Rate Route Frequency Ordered Stop   01/20/14 0000  amoxicillin (AMOXIL) 500 MG tablet     500 mg Oral 3 times daily 01/20/14 1141     01/19/14 1315  cefTRIAXone (ROCEPHIN) 2 g in dextrose 5 % 50 mL IVPB     2 g100 mL/hr over 30 Minutes Intravenous Every 24 hours 01/19/14 1313     01/18/14 2200  ceFEPIme (MAXIPIME) 2 g in dextrose 5 % 50 mL IVPB  Status:  Discontinued     2 g100 mL/hr over 30 Minutes Intravenous Every 12 hours 01/18/14 2115 01/19/14 1313    .  Continued  PT for ambulation and exercise program.      The patient was discharged on 01/20/14 in  Good condition.    DIAGNOSTIC STUDIES:  Recent vital signs: Patient Vitals for the past 24 hrs:  BP Temp Temp src Pulse Resp SpO2 Height Weight  01/20/14 0900 134/78 mmHg 98.6 F (37 C) Oral 83 17 95 % - -  01/20/14 0800 - - - - 18 96 % - -  01/20/14 0555 (!) 166/85 mmHg 98 F (36.7 C) Oral 87 17 97 % - -  01/19/14 2118 (!) 149/78 mmHg 99.3 F (37.4 C) Oral 91 17 96 % 5' 7.99" (1.727 m) 98.57 kg (217 lb 4.9 oz)  01/19/14 1826 (!) 145/78 mmHg 98.6 F (37 C) Oral 86 18 98 % - -  01/19/14 1600 - - - - 18 98 % - -  01/19/14 1150 - - - - 18 96 % - -       Recent laboratory studies:  Recent Labs  01/17/14 0620 01/18/14 2127 01/19/14 0630  WBC 8.1 6.6 6.8  HGB 13.3 12.6 12.7  HCT 39.0 36.5 37.8  PLT 218 187 191    Recent Labs  01/17/14 0620 01/18/14 2127 01/19/14 0630 01/19/14 1921 01/20/14 0420  NA 141 137 141 141 143  K 2.8* 2.7* 2.7* 3.0* 3.1*  CL 99 97 102 103 105  CO2 24 26 21 24 22   BUN 3* 6 5* 6 5*   CREATININE 0.65 0.65 0.59 0.69 0.64  GLUCOSE 147* 113* 107* 121* 111*  CALCIUM 8.6 8.5 8.3* 8.3* 8.3*   Lab Results  Component Value Date   INR 1.24 01/18/2014   INR 1.16 12/29/2013   INR 0.97 09/20/2009     Recent Radiographic Studies :  Dg Chest 2 View  01/17/2014   CLINICAL DATA:  Shortness of breath.  Altered mental status.  EXAM: CHEST  2 VIEW  COMPARISON:  12/29/2013  FINDINGS: Chronic cardiomegaly and mild aortic tortuosity. Stable appearance of the hila. There is no edema, consolidation, effusion (excluding the extreme inferior posterior costophrenic sulci which are excluded from view), or pneumothorax.  IMPRESSION: No active cardiopulmonary disease.   Electronically Signed   By: Jorje Guild M.D.   On: 01/17/2014 07:24   Dg Chest 2 View  12/29/2013   CLINICAL DATA:  Preop for right knee replacement  EXAM: CHEST  2 VIEW  COMPARISON:  04/20/2009  FINDINGS: Cardiomediastinal silhouette is unremarkable. No acute infiltrate or pleural effusion. No pulmonary edema. Stable calcified granuloma in right upper lobe. Mild degenerative changes thoracic spine.  IMPRESSION: No active cardiopulmonary disease.   Electronically Signed   By: Lahoma Crocker M.D.   On: 12/29/2013 16:56   Dg Pelvis 1-2 Views  01/03/2014   CLINICAL DATA:  Chronic pelvic and hip soreness, fell last night, recent and RIGHT knee surgery/replacement on 01/02/2014  EXAM: PELVIS - 1-2 VIEW  COMPARISON:  None  FINDINGS: Diffuse osseous demineralization.  Minimal narrowing of the hip joints.  SI joints symmetric.  Degenerative disc and facet disease changes lower lumbar spine.  No acute fracture, dislocation, or bone destruction.  Numerous RIGHT pelvic phleboliths.  IMPRESSION: No acute osseous abnormalities  Degenerative disc and facet disease changes lower lumbar spine.   Electronically Signed   By: Lavonia Dana M.D.   On: 01/03/2014 09:28   Dg Knee 1-2 Views Right  01/03/2014   CLINICAL DATA:  RIGHT knee pain, fell last night,  recent knee surgery/replacement on 01/02/2014  EXAM: RIGHT KNEE - 1-2 VIEW  COMPARISON:  None  FINDINGS: Osseous demineralization.  Components of RIGHT knee prosthesis identified.  No acute fracture or dislocation.  Deformity  of the proximal fibular diaphysis suggests sequela of old healed fracture.  Expected soft tissue swelling, skin clips and surgical drain related to surgery.  IMPRESSION: RIGHT knee prosthesis without acute complication.   Electronically Signed   By: Lavonia Dana M.D.   On: 01/03/2014 09:27    DISCHARGE INSTRUCTIONS:   DISCHARGE MEDICATIONS:     Medication List    STOP taking these medications        oxyCODONE 5 MG immediate release tablet  Commonly known as:  Oxy IR/ROXICODONE      TAKE these medications        acetaminophen 500 MG tablet  Commonly known as:  TYLENOL  Take 1,000 mg by mouth daily as needed for mild pain.     amoxicillin 500 MG tablet  Commonly known as:  AMOXIL  Take 1 tablet (500 mg total) by mouth 3 (three) times daily.     AZO TABS PO  Take 1-2 tablets by mouth daily as needed (vaginal atrophy).     citalopram 10 MG tablet  Commonly known as:  CELEXA  Take 10 mg by mouth at bedtime.     CLARITIN 10 MG tablet  Generic drug:  loratadine  Take 10 mg by mouth 4 (four) times daily as needed for itching.     furosemide 20 MG tablet  Commonly known as:  LASIX  Take 20 mg by mouth daily as needed for edema.     gabapentin 100 MG capsule  Commonly known as:  NEURONTIN  Take 300 mg by mouth at bedtime.     hydrOXYzine 25 MG tablet  Commonly known as:  ATARAX/VISTARIL  Take 50 mg by mouth at bedtime.     LOPERAMIDE A-D 2 MG tablet  Generic drug:  loperamide  Take 2 mg by mouth as needed for diarrhea or loose stools.     methocarbamol 500 MG tablet  Commonly known as:  ROBAXIN  Take 1-2 tablets (500-1,000 mg total) by mouth every 6 (six) hours as needed for muscle spasms.     NIGHTTIME PO  Take 1 tablet by mouth at bedtime as  needed (sleep).     ondansetron 4 MG disintegrating tablet  Commonly known as:  ZOFRAN-ODT  Take 4 mg by mouth every 6 (six) hours as needed for nausea.     polyethylene glycol packet  Commonly known as:  MIRALAX / GLYCOLAX  Take 17 g by mouth daily as needed for moderate constipation.     potassium chloride SA 20 MEQ tablet  Commonly known as:  K-DUR,KLOR-CON  Take 1 tablet (20 mEq total) by mouth 2 (two) times daily.     senna-docusate 8.6-50 MG per tablet  Commonly known as:  Senokot-S  Take 2 tablets by mouth at bedtime.     SYSTANE OP  Apply 1 drop to eye 3 (three) times daily as needed (dry eyes).     traMADol 50 MG tablet  Commonly known as:  ULTRAM  Take 1-2 tablets (50-100 mg total) by mouth every 4 (four) hours as needed for moderate pain.        FOLLOW UP VISIT:       Follow-up Information    Follow up with Rudean Haskell, MD. Call on 01/31/2014.   Specialty:  Orthopedic Surgery   Contact information:   [place] [address] [phone]       Plan continue oral antibiotics per ID and continue Potassium for hypokalemia which is trending up  CONDITION:  Good   Yonah Tangeman 01/20/2014, 11:47 AM

## 2014-01-20 NOTE — Discharge Instructions (Signed)
Diet: As you were doing prior to hospitalization   Activity:  Increase activity slowly as tolerated                  No lifting or driving for 4 weeks  Shower:  May shower without a dressing                  Dressing:  You may change your dressing asneeded                    Then change the dressing daily with sterile 4"x4"s gauze dressing                       Weight Bearing:  Weight bearing as tolerated  To prevent constipation: you may use a stool softener such as -               Colace ( over the counter) 100 mg by mouth twice a day                Drink plenty of fluids ( prune juice may be helpful) and high fiber foods                Miralax ( over the counter) for constipation as needed.    Precautions:  If you experience chest pain or shortness of breath - call 911 immediately               For transfer to the hospital emergency department!!               If you develop a fever greater that 101 F, purulent drainage from wound,                             increased redness or drainage from wound, or calf pain -- Call the office.  Follow- Up Appointment:  Please call for an appointment to be seen on 01/31/14                                              [facility] office:  [phone]            [address] [place], [place] [address]

## 2014-01-20 NOTE — Evaluation (Signed)
Physical Therapy Evaluation Patient Details Name: Denise Kelly MRN: [id number] DOB: [birth year] Today's Date: 01/20/2014   History of Present Illness  R TKA on 01/02/14. Pt presents back to the ED with confusion and was found to have a UTI.   Clinical Impression  Pt admitted with the above. Pt currently with functional limitations due to the deficits listed below (see PT Problem List). At the time of PT eval pt was able to perform transfers and ambulation with assist mainly for safety. Pt continues to be confused. Pt requires increased cueing for technique during gait training and all exercise as recall is not at baseline. Pt will benefit from skilled PT to increase their independence and safety with mobility to allow discharge to the venue listed below.       Follow Up Recommendations Home health PT;Supervision/Assistance - 24 hour    Equipment Recommendations  None recommended by PT    Recommendations for Other Services       Precautions / Restrictions Precautions Precautions: Fall;Knee Precaution Comments: reinforced knee precautions  Restrictions Weight Bearing Restrictions: Yes RLE Weight Bearing: Weight bearing as tolerated      Mobility  Bed Mobility Overal bed mobility: Needs Assistance Bed Mobility: Supine to Sit     Supine to sit: Min guard     General bed mobility comments: Pt requires increased time to complete transfer to EOB. Pt distracted and requires cues to stay on task. Was able to don socks while in long sitting.   Transfers Overall transfer level: Needs assistance Equipment used: Rolling walker (2 wheeled) Transfers: Sit to/from Stand Sit to Stand: Min guard         General transfer comment: Close guard for safety. Pt was able to power-up to full standing without assist. VC's for hand placement on seated surface for safety.   Ambulation/Gait Ambulation/Gait assistance: Min guard Ambulation Distance (Feet): 350 Feet Assistive device: Rolling  walker (2 wheeled) Gait Pattern/deviations: Step-through pattern;Decreased stride length;Decreased step length - left Gait velocity: Decreased Gait velocity interpretation: Below normal speed for age/gender General Gait Details: VC's for increased step/stride length on the L, and for increased heel strike on the R. Pt was able to ambulate with 2 standing rest breaks (~15 seconds due to pain).   Stairs            Wheelchair Mobility    Modified Rankin (Stroke Patients Only)       Balance Overall balance assessment: Needs assistance Sitting-balance support: Feet supported;No upper extremity supported Sitting balance-Leahy Scale: Good     Standing balance support: No upper extremity supported Standing balance-Leahy Scale: Fair Standing balance comment: Pt able to stand statically without assistance. For dynamic movement pt requires UE support.                              Pertinent Vitals/Pain Pain Assessment: Faces Faces Pain Scale: Hurts little more Pain Location: R knee Pain Intervention(s): Monitored during session    Home Living Family/patient expects to be discharged to:: Private residence Living Arrangements: Alone Available Help at Discharge: Family;Available PRN/intermittently Type of Home: House Home Access: Level entry     Home Layout: One level Home Equipment: Walker - 2 wheels;Bedside commode      Prior Function Level of Independence: Needs assistance   Gait / Transfers Assistance Needed: Pt using RW all the time  ADL's / Homemaking Assistance Needed: Pt was at SNF prior to returning home. Unsure  of level of assist at home due to confusion.         Hand Dominance   Dominant Hand: Right    Extremity/Trunk Assessment   Upper Extremity Assessment: Defer to OT evaluation           Lower Extremity Assessment: RLE deficits/detail RLE Deficits / Details: Decreased strength and AROM consistent with TKA    Cervical / Trunk  Assessment: Normal  Communication   Communication: No difficulties  Cognition Arousal/Alertness: Awake/alert Behavior During Therapy: WFL for tasks assessed/performed Overall Cognitive Status: Impaired/Different from baseline Area of Impairment: Orientation;Memory;Safety/judgement;Problem solving Orientation Level: Disoriented to;Place;Situation Current Attention Level: Alternating Memory: Decreased recall of precautions;Decreased short-term memory   Safety/Judgement: Decreased awareness of safety;Decreased awareness of deficits   Problem Solving: Slow processing;Decreased initiation General Comments: Pt states she is at the [employer]. Asks many questions about location as if she does not recall she is in the hospital.     General Comments      Exercises Total Joint Exercises Ankle Circles/Pumps: 10 reps Quad Sets: 10 reps Knee Flexion: 5 reps (with hold)      Assessment/Plan    PT Assessment Patient needs continued PT services  PT Diagnosis Difficulty walking;Abnormality of gait   PT Problem List Decreased strength;Decreased range of motion;Decreased activity tolerance;Decreased balance;Decreased mobility;Decreased knowledge of use of DME;Decreased safety awareness;Decreased knowledge of precautions  PT Treatment Interventions DME instruction;Gait training;Stair training;Functional mobility training;Therapeutic activities;Therapeutic exercise;Neuromuscular re-education;Patient/family education   PT Goals (Current goals can be found in the Care Plan section) Acute Rehab PT Goals Patient Stated Goal: Return home PT Goal Formulation: With patient Time For Goal Achievement: 01/27/14 Potential to Achieve Goals: Good    Frequency Min 4X/week   Barriers to discharge        Co-evaluation               End of Session Equipment Utilized During Treatment: Gait belt Activity Tolerance: Patient tolerated treatment well Patient left: in chair;with call bell/phone within  reach;with chair alarm set Nurse Communication: Mobility status         Time: 0812-0857 PT Time Calculation (min): 45 min   Charges:   PT Evaluation $Initial PT Evaluation Tier I: 1 Procedure PT Treatments $Gait Training: 8-22 mins $Therapeutic Exercise: 8-22 mins $Therapeutic Activity: 8-22 mins   PT G Codes:          Rolinda Roan 01/20/2014, 10:03 AM   Rolinda Roan, PT, DPT Acute Rehabilitation Services Pager: [phone]

## 2014-01-20 NOTE — Progress Notes (Signed)
Patient Discharge: Disposition: Patient discharged to home with daughter. Education: Patient educated the on hygiene, UTI signs and symptoms, medications, prescriptions, follow-up appointments, discharge instructions.   IV: Discontinued before discharge. Transportation: Patient transported in w/c with daughter accompanying. Belongings: Patient took all her belongings with her.

## 2014-01-20 NOTE — Plan of Care (Signed)
Problem: Phase I Progression Outcomes Goal: Vital Signs stable- temperature less than 102 Outcome: Completed/Met Date Met:  01/20/14

## 2014-01-20 NOTE — Plan of Care (Signed)
Problem: Phase II Progression Outcomes Goal: Discharge plan established Outcome: Completed/Met Date Met:  01/20/14  Problem: Phase III Progression Outcomes Goal: Discharge plan remains appropriate-arrangements made Outcome: Completed/Met Date Met:  01/20/14  Problem: Discharge Progression Outcomes Goal: Barriers To Progression Addressed/Resolved Outcome: Completed/Met Date Met:  01/20/14 Goal: Discharge plan in place and appropriate Outcome: Completed/Met Date Met:  01/20/14 Goal: Hemodynamically stable Outcome: Completed/Met Date Met:  86/75/44 Goal: Complications resolved/controlled Outcome: Completed/Met Date Met:  01/20/14 Goal: Activity appropriate for discharge plan Outcome: Completed/Met Date Met:  01/20/14

## 2014-01-23 LAB — HIV-1 RNA QUANT-NO REFLEX-BLD

## 2014-01-25 LAB — CULTURE, BLOOD (ROUTINE X 2)
CULTURE: NO GROWTH
Culture: NO GROWTH

## 2014-01-26 ENCOUNTER — Ambulatory Visit (HOSPITAL_COMMUNITY)
Admission: RE | Admit: 2014-01-26 | Discharge: 2014-01-26 | Disposition: A | Discharge status: Home / Self Care | Payer: Medicare Other | Source: Ambulatory Visit | Attending: Orthopedic Surgery | Admitting: Orthopedic Surgery

## 2014-01-26 ENCOUNTER — Other Ambulatory Visit (HOSPITAL_COMMUNITY): Payer: Self-pay | Admitting: Orthopedic Surgery

## 2014-01-26 DIAGNOSIS — M79604 Pain in right leg: Secondary | ICD-10-CM

## 2014-01-26 DIAGNOSIS — M7989 Other specified soft tissue disorders: Secondary | ICD-10-CM

## 2014-01-26 DIAGNOSIS — M79609 Pain in unspecified limb: Secondary | ICD-10-CM

## 2014-01-26 DIAGNOSIS — M79661 Pain in right lower leg: Secondary | ICD-10-CM

## 2014-01-26 NOTE — Progress Notes (Signed)
VASCULAR LAB PRELIMINARY  PRELIMINARY  PRELIMINARY  PRELIMINARY  Right lower extremity venous Doppler completed.    Preliminary report:  There is no DVT or SVT noted in the right lower extremity.   Yun Gutierrez, RVT 01/26/2014, 1:40 PM

## 2014-02-21 ENCOUNTER — Emergency Department (HOSPITAL_COMMUNITY): Payer: Medicare Other

## 2014-02-21 ENCOUNTER — Encounter (HOSPITAL_COMMUNITY): Payer: Self-pay

## 2014-02-21 ENCOUNTER — Inpatient Hospital Stay (HOSPITAL_COMMUNITY)
Admission: EM | Admit: 2014-02-21 | Discharge: 2014-02-24 | DRG: 442 | Disposition: A | Discharge status: Skilled Nursing Facility | Payer: Medicare Other | Attending: Internal Medicine | Admitting: Internal Medicine

## 2014-02-21 ENCOUNTER — Inpatient Hospital Stay (HOSPITAL_COMMUNITY): Payer: Medicare Other

## 2014-02-21 DIAGNOSIS — E669 Obesity, unspecified: Secondary | ICD-10-CM | POA: Diagnosis present

## 2014-02-21 DIAGNOSIS — F332 Major depressive disorder, recurrent severe without psychotic features: Secondary | ICD-10-CM | POA: Diagnosis present

## 2014-02-21 DIAGNOSIS — Z87891 Personal history of nicotine dependence: Secondary | ICD-10-CM | POA: Diagnosis not present

## 2014-02-21 DIAGNOSIS — F419 Anxiety disorder, unspecified: Secondary | ICD-10-CM | POA: Diagnosis present

## 2014-02-21 DIAGNOSIS — I10 Essential (primary) hypertension: Secondary | ICD-10-CM | POA: Diagnosis present

## 2014-02-21 DIAGNOSIS — F1994 Other psychoactive substance use, unspecified with psychoactive substance-induced mood disorder: Secondary | ICD-10-CM

## 2014-02-21 DIAGNOSIS — M25569 Pain in unspecified knee: Secondary | ICD-10-CM

## 2014-02-21 DIAGNOSIS — M25461 Effusion, right knee: Secondary | ICD-10-CM | POA: Diagnosis present

## 2014-02-21 DIAGNOSIS — F10239 Alcohol dependence with withdrawal, unspecified: Secondary | ICD-10-CM | POA: Diagnosis present

## 2014-02-21 DIAGNOSIS — F1020 Alcohol dependence, uncomplicated: Secondary | ICD-10-CM

## 2014-02-21 DIAGNOSIS — K219 Gastro-esophageal reflux disease without esophagitis: Secondary | ICD-10-CM | POA: Diagnosis present

## 2014-02-21 DIAGNOSIS — R627 Adult failure to thrive: Secondary | ICD-10-CM | POA: Diagnosis present

## 2014-02-21 DIAGNOSIS — Z882 Allergy status to sulfonamides status: Secondary | ICD-10-CM | POA: Diagnosis not present

## 2014-02-21 DIAGNOSIS — K7290 Hepatic failure, unspecified without coma: Principal | ICD-10-CM | POA: Diagnosis present

## 2014-02-21 DIAGNOSIS — M1990 Unspecified osteoarthritis, unspecified site: Secondary | ICD-10-CM | POA: Diagnosis present

## 2014-02-21 DIAGNOSIS — M25561 Pain in right knee: Secondary | ICD-10-CM | POA: Diagnosis present

## 2014-02-21 DIAGNOSIS — R4182 Altered mental status, unspecified: Secondary | ICD-10-CM | POA: Diagnosis present

## 2014-02-21 DIAGNOSIS — R000 Tachycardia, unspecified: Secondary | ICD-10-CM | POA: Diagnosis present

## 2014-02-21 DIAGNOSIS — Z881 Allergy status to other antibiotic agents status: Secondary | ICD-10-CM | POA: Diagnosis not present

## 2014-02-21 DIAGNOSIS — K7682 Hepatic encephalopathy: Secondary | ICD-10-CM | POA: Insufficient documentation

## 2014-02-21 DIAGNOSIS — N390 Urinary tract infection, site not specified: Secondary | ICD-10-CM | POA: Diagnosis present

## 2014-02-21 DIAGNOSIS — R0602 Shortness of breath: Secondary | ICD-10-CM

## 2014-02-21 DIAGNOSIS — R6510 Systemic inflammatory response syndrome (SIRS) of non-infectious origin without acute organ dysfunction: Secondary | ICD-10-CM | POA: Diagnosis present

## 2014-02-21 DIAGNOSIS — Z6830 Body mass index (BMI) 30.0-30.9, adult: Secondary | ICD-10-CM

## 2014-02-21 LAB — CREATININE, SERUM
CREATININE: 0.74 mg/dL (ref 0.50–1.10)
GFR calc Af Amer: >90 mL/min (ref >90)
GFR calc non Af Amer: 83 mL/min — ABNORMAL LOW (ref >90)

## 2014-02-21 LAB — COMPREHENSIVE METABOLIC PANEL
ALBUMIN: 3.6 g/dL (ref 3.5–5.2)
ALK PHOS: 142 U/L — AB (ref 39–117)
ALT: 17 U/L (ref 0–35)
AST: 34 U/L (ref 0–37)
Anion gap: 23 — ABNORMAL HIGH (ref 5–15)
BILIRUBIN TOTAL: 0.6 mg/dL (ref 0.3–1.2)
BUN: 7 mg/dL (ref 6–23)
CO2: 22 mEq/L (ref 19–32)
Calcium: 10.1 mg/dL (ref 8.4–10.5)
Chloride: 98 mEq/L (ref 96–112)
Creatinine, Ser: 0.72 mg/dL (ref 0.50–1.10)
GFR calc Af Amer: >90 mL/min (ref >90)
GFR calc non Af Amer: 84 mL/min — ABNORMAL LOW (ref >90)
Glucose, Bld: 137 mg/dL — ABNORMAL HIGH (ref 70–99)
POTASSIUM: 3.3 meq/L — AB (ref 3.7–5.3)
Sodium: 143 mEq/L (ref 137–147)
Total Protein: 7.9 g/dL (ref 6.0–8.3)

## 2014-02-21 LAB — URINALYSIS, ROUTINE W REFLEX MICROSCOPIC
BILIRUBIN URINE: NEGATIVE
Glucose, UA: NEGATIVE mg/dL
HGB URINE DIPSTICK: NEGATIVE
Ketones, ur: 15 mg/dL — AB
Nitrite: POSITIVE — AB
PH: 7.5 (ref 5.0–8.0)
Protein, ur: NEGATIVE mg/dL
SPECIFIC GRAVITY, URINE: 1.031 — AB (ref 1.005–1.030)
Urobilinogen, UA: 1 mg/dL (ref 0.0–1.0)

## 2014-02-21 LAB — RAPID URINE DRUG SCREEN, HOSP PERFORMED
AMPHETAMINES: NOT DETECTED
Barbiturates: NOT DETECTED
Benzodiazepines: NOT DETECTED
COCAINE: NOT DETECTED
Opiates: POSITIVE — AB
TETRAHYDROCANNABINOL: NOT DETECTED

## 2014-02-21 LAB — SALICYLATE LEVEL: Salicylate Lvl: <2 mg/dL — ABNORMAL LOW (ref 2.8–20.0)

## 2014-02-21 LAB — CBC
HCT: 46.8 % — ABNORMAL HIGH (ref 36.0–46.0)
HEMATOCRIT: 43.5 % (ref 36.0–46.0)
HEMOGLOBIN: 15.3 g/dL — AB (ref 12.0–15.0)
Hemoglobin: 16.7 g/dL — ABNORMAL HIGH (ref 12.0–15.0)
MCH: 30.9 pg (ref 26.0–34.0)
MCH: 31.2 pg (ref 26.0–34.0)
MCHC: 35.7 g/dL (ref 30.0–36.0)
MCHC: 35.2 g/dL (ref 30.0–36.0)
MCV: 86.7 fL (ref 78.0–100.0)
MCV: 88.6 fL (ref 78.0–100.0)
PLATELETS: 230 10*3/uL (ref 150–400)
Platelets: 191 10*3/uL (ref 150–400)
RBC: 5.4 MIL/uL — ABNORMAL HIGH (ref 3.87–5.11)
RBC: 4.91 MIL/uL (ref 3.87–5.11)
RDW: 13.3 % (ref 11.5–15.5)
RDW: 13.6 % (ref 11.5–15.5)
WBC: 7.1 10*3/uL (ref 4.0–10.5)
WBC: 7.5 10*3/uL (ref 4.0–10.5)

## 2014-02-21 LAB — AMMONIA: Ammonia: 74 umol/L — ABNORMAL HIGH (ref 11–60)

## 2014-02-21 LAB — ETHANOL

## 2014-02-21 LAB — URINE MICROSCOPIC-ADD ON

## 2014-02-21 LAB — TSH: TSH: 1.32 u[IU]/mL (ref 0.350–4.500)

## 2014-02-21 LAB — MAGNESIUM: Magnesium: 1.7 mg/dL (ref 1.5–2.5)

## 2014-02-21 LAB — PRO B NATRIURETIC PEPTIDE: Pro B Natriuretic peptide (BNP): 166.9 pg/mL — ABNORMAL HIGH (ref 0–125)

## 2014-02-21 LAB — URIC ACID: Uric Acid, Serum: 4.6 mg/dL (ref 2.4–7.0)

## 2014-02-21 LAB — TROPONIN I: Troponin I: <0.3 ng/mL (ref <0.30)

## 2014-02-21 LAB — ACETAMINOPHEN LEVEL: Acetaminophen (Tylenol), Serum: <15 ug/mL (ref 10–30)

## 2014-02-21 LAB — HEMOGLOBIN A1C
Hgb A1c MFr Bld: 5.4 % (ref <5.7)
Mean Plasma Glucose: 108 mg/dL (ref <117)

## 2014-02-21 MED ORDER — LORAZEPAM 2 MG/ML IJ SOLN: 1.0000 mg | Freq: Four times a day (QID) | INTRAMUSCULAR | Status: AC | PRN

## 2014-02-21 MED ORDER — ENOXAPARIN SODIUM 40 MG/0.4ML ~~LOC~~ SOLN
40.0000 mg | SUBCUTANEOUS | Status: DC
Start: 2014-02-21 — End: 2014-02-24
  Administered 2014-02-21 – 2014-02-23 (×3): 40 mg via SUBCUTANEOUS
  Filled 2014-02-21 (×4): qty 0.4

## 2014-02-21 MED ORDER — ACETAMINOPHEN 650 MG RE SUPP: 650.0000 mg | Freq: Four times a day (QID) | RECTAL | Status: DC | PRN

## 2014-02-21 MED ORDER — LORAZEPAM 2 MG/ML IJ SOLN
1.0000 mg | Freq: Once | INTRAMUSCULAR | Status: AC
  Administered 2014-02-21: 1 mg via INTRAVENOUS
  Filled 2014-02-21: qty 1

## 2014-02-21 MED ORDER — LORAZEPAM 2 MG/ML IJ SOLN: 1.0000 mg | Freq: Four times a day (QID) | INTRAMUSCULAR | Status: DC | PRN

## 2014-02-21 MED ORDER — LORAZEPAM 1 MG PO TABS: 0.0000 mg | ORAL_TABLET | Freq: Two times a day (BID) | ORAL | Status: DC

## 2014-02-21 MED ORDER — SODIUM CHLORIDE 0.9 % IJ SOLN: 3.0000 mL | Freq: Two times a day (BID) | INTRAMUSCULAR | Status: DC

## 2014-02-21 MED ORDER — IOHEXOL 350 MG/ML SOLN
100.0000 mL | Freq: Once | INTRAVENOUS | Status: AC | PRN
  Administered 2014-02-21: 100 mL via INTRAVENOUS

## 2014-02-21 MED ORDER — HYDROXYZINE HCL 50 MG PO TABS
50.0000 mg | ORAL_TABLET | Freq: Every day | ORAL | Status: DC
  Administered 2014-02-21 – 2014-02-23 (×3): 50 mg via ORAL
  Filled 2014-02-21 (×3): qty 1

## 2014-02-21 MED ORDER — LACTULOSE 10 GM/15ML PO SOLN
10.0000 g | Freq: Three times a day (TID) | ORAL | Status: DC
  Administered 2014-02-22 – 2014-02-24 (×5): 10 g via ORAL
  Filled 2014-02-21 (×9): qty 15

## 2014-02-21 MED ORDER — LORAZEPAM 1 MG PO TABS: 0.0000 mg | ORAL_TABLET | Freq: Four times a day (QID) | ORAL | Status: DC

## 2014-02-21 MED ORDER — ADULT MULTIVITAMIN W/MINERALS CH: 1.0000 | ORAL_TABLET | Freq: Every day | ORAL | Status: DC

## 2014-02-21 MED ORDER — ONDANSETRON HCL 4 MG/2ML IJ SOLN
4.0000 mg | Freq: Four times a day (QID) | INTRAMUSCULAR | Status: DC | PRN
  Administered 2014-02-21 – 2014-02-22 (×2): 4 mg via INTRAVENOUS
  Filled 2014-02-21 (×2): qty 2

## 2014-02-21 MED ORDER — THIAMINE HCL 100 MG/ML IJ SOLN: 100.0000 mg | Freq: Every day | INTRAMUSCULAR | Status: DC

## 2014-02-21 MED ORDER — SODIUM CHLORIDE 0.9 % IV SOLN
INTRAVENOUS | Status: DC
  Administered 2014-02-22 – 2014-02-23 (×2): via INTRAVENOUS

## 2014-02-21 MED ORDER — FOLIC ACID 1 MG PO TABS: 1.0000 mg | ORAL_TABLET | Freq: Every day | ORAL | Status: DC

## 2014-02-21 MED ORDER — ONDANSETRON HCL 4 MG PO TABS
4.0000 mg | ORAL_TABLET | Freq: Four times a day (QID) | ORAL | Status: DC | PRN
  Administered 2014-02-22 – 2014-02-24 (×6): 4 mg via ORAL
  Filled 2014-02-21 (×6): qty 1

## 2014-02-21 MED ORDER — POTASSIUM CHLORIDE 10 MEQ/100ML IV SOLN
10.0000 meq | INTRAVENOUS | Status: AC
  Administered 2014-02-21 – 2014-02-22 (×3): 10 meq via INTRAVENOUS
  Filled 2014-02-21 (×3): qty 100

## 2014-02-21 MED ORDER — ONDANSETRON HCL 4 MG/2ML IJ SOLN
4.0000 mg | Freq: Once | INTRAMUSCULAR | Status: AC
  Administered 2014-02-21: 4 mg via INTRAVENOUS
  Filled 2014-02-21: qty 2

## 2014-02-21 MED ORDER — VITAMIN B-1 100 MG PO TABS: 100.0000 mg | ORAL_TABLET | Freq: Every day | ORAL | Status: DC

## 2014-02-21 MED ORDER — MAGNESIUM SULFATE 2 GM/50ML IV SOLN
2.0000 g | Freq: Once | INTRAVENOUS | Status: AC
  Administered 2014-02-21: 2 g via INTRAVENOUS
  Filled 2014-02-21: qty 50

## 2014-02-21 MED ORDER — DEXTROSE 5 % IV SOLN
1.0000 g | Freq: Once | INTRAVENOUS | Status: AC
  Administered 2014-02-21: 1 g via INTRAVENOUS
  Filled 2014-02-21: qty 10

## 2014-02-21 MED ORDER — LORAZEPAM 1 MG PO TABS
0.0000 mg | ORAL_TABLET | Freq: Four times a day (QID) | ORAL | Status: AC
Start: 2014-02-21 — End: 2014-02-23
  Administered 2014-02-22 – 2014-02-23 (×6): 1 mg via ORAL
  Filled 2014-02-21: qty 2
  Filled 2014-02-21 (×3): qty 1

## 2014-02-21 MED ORDER — THIAMINE HCL 100 MG/ML IJ SOLN
100.0000 mg | Freq: Every day | INTRAMUSCULAR | Status: DC
  Filled 2014-02-21 (×3): qty 1

## 2014-02-21 MED ORDER — DEXTROSE 5 % IV SOLN
1.0000 g | INTRAVENOUS | Status: DC
  Filled 2014-02-21: qty 10

## 2014-02-21 MED ORDER — LEVALBUTEROL HCL 0.63 MG/3ML IN NEBU: 0.6300 mg | INHALATION_SOLUTION | Freq: Four times a day (QID) | RESPIRATORY_TRACT | Status: DC | PRN

## 2014-02-21 MED ORDER — LORAZEPAM 1 MG PO TABS: 1.0000 mg | ORAL_TABLET | Freq: Four times a day (QID) | ORAL | Status: DC | PRN

## 2014-02-21 MED ORDER — LORAZEPAM 1 MG PO TABS
1.0000 mg | ORAL_TABLET | Freq: Four times a day (QID) | ORAL | Status: AC | PRN
  Administered 2014-02-24: 1 mg via ORAL
  Filled 2014-02-21 (×2): qty 1

## 2014-02-21 MED ORDER — GABAPENTIN 300 MG PO CAPS
300.0000 mg | ORAL_CAPSULE | Freq: Every day | ORAL | Status: DC
  Administered 2014-02-21 – 2014-02-23 (×3): 300 mg via ORAL
  Filled 2014-02-21 (×3): qty 1

## 2014-02-21 MED ORDER — SODIUM CHLORIDE 0.9 % IV BOLUS (SEPSIS)
1000.0000 mL | Freq: Once | INTRAVENOUS | Status: AC
  Administered 2014-02-21: 1000 mL via INTRAVENOUS

## 2014-02-21 MED ORDER — CITALOPRAM HYDROBROMIDE 10 MG PO TABS
10.0000 mg | ORAL_TABLET | Freq: Every day | ORAL | Status: DC
  Administered 2014-02-21 – 2014-02-22 (×2): 10 mg via ORAL
  Filled 2014-02-21 (×2): qty 1

## 2014-02-21 MED ORDER — ADULT MULTIVITAMIN W/MINERALS CH
1.0000 | ORAL_TABLET | Freq: Every day | ORAL | Status: DC
  Administered 2014-02-21 – 2014-02-24 (×4): 1 via ORAL
  Filled 2014-02-21 (×4): qty 1

## 2014-02-21 MED ORDER — OXYCODONE HCL 5 MG PO TABS
5.0000 mg | ORAL_TABLET | Freq: Four times a day (QID) | ORAL | Status: DC | PRN
  Administered 2014-02-22 – 2014-02-24 (×9): 5 mg via ORAL
  Filled 2014-02-21 (×9): qty 1

## 2014-02-21 MED ORDER — POTASSIUM CHLORIDE CRYS ER 20 MEQ PO TBCR
20.0000 meq | EXTENDED_RELEASE_TABLET | Freq: Two times a day (BID) | ORAL | Status: DC
  Administered 2014-02-21 – 2014-02-24 (×6): 20 meq via ORAL
  Filled 2014-02-21 (×6): qty 1

## 2014-02-21 MED ORDER — FOLIC ACID 1 MG PO TABS
1.0000 mg | ORAL_TABLET | Freq: Every day | ORAL | Status: DC
  Administered 2014-02-21 – 2014-02-24 (×4): 1 mg via ORAL
  Filled 2014-02-21 (×4): qty 1

## 2014-02-21 MED ORDER — ACETAMINOPHEN 325 MG PO TABS
650.0000 mg | ORAL_TABLET | Freq: Four times a day (QID) | ORAL | Status: DC | PRN
  Administered 2014-02-22 – 2014-02-24 (×5): 650 mg via ORAL
  Filled 2014-02-21 (×5): qty 2

## 2014-02-21 MED ORDER — MORPHINE SULFATE 4 MG/ML IJ SOLN
4.0000 mg | Freq: Once | INTRAMUSCULAR | Status: AC
  Administered 2014-02-21: 4 mg via INTRAVENOUS
  Filled 2014-02-21: qty 1

## 2014-02-21 MED ORDER — VITAMIN B-1 100 MG PO TABS
100.0000 mg | ORAL_TABLET | Freq: Every day | ORAL | Status: DC
  Administered 2014-02-21 – 2014-02-24 (×4): 100 mg via ORAL
  Filled 2014-02-21 (×4): qty 1

## 2014-02-21 NOTE — ED Notes (Signed)
Patient complaining of a dry mouth and asked for water. This tech gave biotene and mouth swabs for the time being. RN aware

## 2014-02-21 NOTE — ED Notes (Signed)
Patient refused to give sample but patients daughter stated she would try to get there to use the bedpan.

## 2014-02-21 NOTE — ED Notes (Signed)
Bed: WA17 Expected date:  Expected time:  Means of arrival:  Comments: TR 1

## 2014-02-21 NOTE — ED Notes (Signed)
Ed nurse notified and floor charge nurse jennifer ok to bring...klj

## 2014-02-21 NOTE — ED Notes (Signed)
Patient transported to CT 

## 2014-02-21 NOTE — ED Notes (Signed)
Pt's Ortho MD, Ronnie Derby, would like to be called by ED provider at [phone].

## 2014-02-21 NOTE — ED Provider Notes (Signed)
CSN: [id number]     Arrival date & time 02/21/14  1211 History   First MD Initiated Contact with Patient 02/21/14 1312     Chief Complaint  Patient presents with  . Generalized Body Aches  . Altered Mental Status     (Consider location/radiation/quality/duration/timing/severity/associated sxs/prior Treatment) HPI  A LEVEL 5 CAVEAT PERTAINS DUE TO ALTERED MENTAL STATUS Pt presenting with c/o altered mental status.  History is obtained from daughter.  Pt had knee replacement 2 months ago- was doing well without complications- then developed case of diarrhea approx 1 month ago- this has resolved.  Over the past 2 weeks patient has become more confused.  She has not been getting out of bed, she has not been eating or drinking well.  She has not showered since October.  She has not been going to physical therapy as she refuses to get out of bed.  She has been more forgetful- both short and long term forgetfulness.  No focal weakness, no changes in speech.  She is restless and agitated, can't get comfortable- some of this related to knee pain, but states knee pain is not very bad.  No fever/chills.  No vomiting or diarrhea.  Daughter states her only po intake for 2 weeks was wine and Coke- approx 5 days ago stopped drinking wine.  restlesssness has been somewhat worse since stopping alcohol, but had started prior as well.  Pt has been in rehab for both alcohol and xanax abuse in the past.  Daughter states she has counted her pain meds and none are unaccounted for.    Past Medical History  Diagnosis Date  . Complication of anesthesia     "felt drunk for a week after"  . Hypertension     not being treated with medication  . Palpitations   . Anxiety   . GERD (gastroesophageal reflux disease)   . Arthritis   . Cancer     skin  . Alcohol abuse     ETOH and xanax   Past Surgical History  Procedure Laterality Date  . Tonsillectomy    . Tubal ligation    . Carotid cavernous fistula      to block   . Breast surgery      2 breast biopsies  . Eye surgery Bilateral     cataracts  . Colonoscopy    . Total knee arthroplasty Right 01/02/2014    Dr Ronnie Derby  . Total knee arthroplasty Right 01/02/2014    Procedure: TOTAL KNEE ARTHROPLASTY;  Surgeon: [name], MD;  Location: [place];  Service: Orthopedics;  Laterality: Right;  . Joint replacement Left 09/2009    knee  . Joint replacement Right 01/02/2014   History reviewed. No pertinent family history. History  Substance Use Topics  . Smoking status: Former Research scientist (life sciences)  . Smokeless tobacco: Never Used     Comment: quit smoking many years ago   . Alcohol Use: Yes   OB History    No data available     Review of Systems  UNABLE TO OBTAIN ROS DUE TO ALTERED MENTAL STATUS    Allergies  Sulfa antibiotics; Ciprofloxacin; and Motrin  Home Medications   Prior to Admission medications   Medication Sig Start Date End Date Taking? Authorizing Provider  acetaminophen (TYLENOL) 500 MG tablet Take 1,000 mg by mouth daily as needed for mild pain.   Yes Historical Provider, MD  citalopram (CELEXA) 10 MG tablet Take 10 mg by mouth at bedtime.  12/05/13  Yes  Historical Provider, MD  furosemide (LASIX) 20 MG tablet Take 20 mg by mouth daily as needed for edema.  11/01/13  Yes Historical Provider, MD  gabapentin (NEURONTIN) 100 MG capsule Take 300 mg by mouth at bedtime.  10/31/13  Yes Historical Provider, MD  hydrOXYzine (ATARAX/VISTARIL) 25 MG tablet Take 50 mg by mouth at bedtime.  08/12/13  Yes Historical Provider, MD  loperamide (LOPERAMIDE A-D) 2 MG tablet Take 2 mg by mouth as needed for diarrhea or loose stools.   Yes Historical Provider, MD  loratadine (CLARITIN) 10 MG tablet Take 10 mg by mouth 4 (four) times daily as needed for itching.   Yes Historical Provider, MD  ondansetron (ZOFRAN-ODT) 4 MG disintegrating tablet Take 4 mg by mouth every 6 (six) hours as needed for nausea.  10/31/13  Yes Historical Provider, MD  OXYCONTIN 10 MG T12A 12 hr  tablet Take 10 mg by mouth every 12 (twelve) hours.  02/14/14  Yes Historical Provider, MD  Phenazopyridine HCl (AZO TABS PO) Take 1-2 tablets by mouth daily as needed (vaginal atrophy).   Yes Historical Provider, MD  Polyethyl Glycol-Propyl Glycol (SYSTANE OP) Apply 1 drop to eye 3 (three) times daily as needed (dry eyes).   Yes Historical Provider, MD  polyethylene glycol (MIRALAX / GLYCOLAX) packet Take 17 g by mouth daily as needed for moderate constipation.    Yes Historical Provider, MD  potassium chloride SA (K-DUR,KLOR-CON) 20 MEQ tablet Take 1 tablet (20 mEq total) by mouth 2 (two) times daily. 01/20/14  Yes Carlynn Spry, PA-C  Pseudoeph-Doxylamine-DM-APAP (NIGHTTIME PO) Take 1 tablet by mouth at bedtime as needed (sleep).   Yes Historical Provider, MD  senna-docusate (SENOKOT-S) 8.6-50 MG per tablet Take 2 tablets by mouth at bedtime.   Yes Historical Provider, MD  zolpidem (AMBIEN) 10 MG tablet Take 10 mg by mouth at bedtime as needed for sleep.  02/16/14  Yes Historical Provider, MD  amoxicillin (AMOXIL) 500 MG tablet Take 1 tablet (500 mg total) by mouth 3 (three) times daily. Patient not taking: Reported on 02/21/2014 01/20/14   Carlynn Spry, PA-C  methocarbamol (ROBAXIN) 500 MG tablet Take 1-2 tablets (500-1,000 mg total) by mouth every 6 (six) hours as needed for muscle spasms. Patient not taking: Reported on 02/21/2014 01/05/14   Carlynn Spry, PA-C  traMADol (ULTRAM) 50 MG tablet Take 1-2 tablets (50-100 mg total) by mouth every 4 (four) hours as needed for moderate pain. Patient not taking: Reported on 02/21/2014 01/05/14   Carlynn Spry, PA-C   BP 158/89 mmHg  Pulse 97  Temp(Src) 97.3 F (36.3 C) (Axillary)  Resp 20  Ht 5\' 8"  (1.727 m)  Wt 200 lb (90.719 kg)  BMI 30.42 kg/m2  SpO2 99%  Vitals reviewed Physical Exam  Physical Examination: General appearance - alert, restless appearing, and in no distress Mental status - alert, oriented to person, place, and time Eyes -  pupils equal and reactive, extraocular eye movements intact Mouth - mucous membranes dry Chest - clear to auscultation, no wheezes, rales or rhonchi, symmetric air entry, pt with pursed lip breathing, restless but denies feeling short of breath Heart - tachycardic, regular rhythm, normal S1, S2, no murmurs, rubs, clicks or gallops Abdomen - soft, nontender, nondistended, no masses or organomegaly Neurological - alert, oriented x 3, cranial nerves 2-12 tested and intact, strength 5/5 in extremities x4, sensation intact, restless appearing, seems forgetful of things discussed during brief conversations with me in the ED.  Extremities - peripheral pulses normal, no  pedal edema, no clubbing or cyanosis Skin - normal coloration and turgor, no rashes Psych-restless, cooperative  ED Course  Procedures (including critical care time)  4:35 PM patient and family updated about findings.  Daughter states Dr. Ronnie Derby has requested that ED provider call him- I am paging him now.    5:09 PM d/w Dr. Allyson Sabal, triad, pt to be admitted to telemetry bed.  I have paged her orthopedic, Dr. Ronnie Derby as a courtesy call but have not heard back.  Do not feel her current condition is related to her knee replacement directly.  Labs Review Labs Reviewed  CBC - Abnormal; Notable for the following:    RBC 5.40 (*)    Hemoglobin 16.7 (*)    HCT 46.8 (*)    All other components within normal limits  COMPREHENSIVE METABOLIC PANEL - Abnormal; Notable for the following:    Potassium 3.3 (*)    Glucose, Bld 137 (*)    Alkaline Phosphatase 142 (*)    GFR calc non Af Amer 84 (*)    Anion gap 23 (*)    All other components within normal limits  AMMONIA - Abnormal; Notable for the following:    Ammonia 74 (*)    All other components within normal limits  URINALYSIS, ROUTINE W REFLEX MICROSCOPIC - Abnormal; Notable for the following:    APPearance CLOUDY (*)    Specific Gravity, Urine 1.031 (*)    Ketones, ur 15 (*)    Nitrite  POSITIVE (*)    Leukocytes, UA TRACE (*)    All other components within normal limits  URINE RAPID DRUG SCREEN (HOSP PERFORMED) - Abnormal; Notable for the following:    Opiates POSITIVE (*)    All other components within normal limits  SALICYLATE LEVEL - Abnormal; Notable for the following:    Salicylate Lvl <8.3 (*)    All other components within normal limits  URINE MICROSCOPIC-ADD ON - Abnormal; Notable for the following:    Bacteria, UA MANY (*)    All other components within normal limits  CBC - Abnormal; Notable for the following:    Hemoglobin 15.3 (*)    All other components within normal limits  CREATININE, SERUM - Abnormal; Notable for the following:    GFR calc non Af Amer 83 (*)    All other components within normal limits  COMPREHENSIVE METABOLIC PANEL - Abnormal; Notable for the following:    Potassium 2.9 (*)    Glucose, Bld 112 (*)    BUN 5 (*)    Calcium 8.2 (*)    Total Protein 5.8 (*)    Albumin 2.7 (*)    GFR calc non Af Amer 83 (*)    All other components within normal limits  PRO B NATRIURETIC PEPTIDE - Abnormal; Notable for the following:    Pro B Natriuretic peptide (BNP) 166.9 (*)    All other components within normal limits  AMMONIA - Abnormal; Notable for the following:    Ammonia 10 (*)    All other components within normal limits  BASIC METABOLIC PANEL - Abnormal; Notable for the following:    Potassium 3.2 (*)    Glucose, Bld 109 (*)    BUN 5 (*)    GFR calc non Af Amer 72 (*)    GFR calc Af Amer 83 (*)    All other components within normal limits  C-REACTIVE PROTEIN - Abnormal; Notable for the following:    CRP 0.7 (*)    All other  components within normal limits  CULTURE, BLOOD (ROUTINE X 2)  CULTURE, BLOOD (ROUTINE X 2)  CLOSTRIDIUM DIFFICILE BY PCR  URINE CULTURE  ETHANOL  ACETAMINOPHEN LEVEL  MAGNESIUM  CBC  TROPONIN I  TROPONIN I  TROPONIN I  HEMOGLOBIN A1C  TSH  AMMONIA  URIC ACID  HEPATITIS B SURFACE ANTIGEN  HEPATITIS  C ANTIBODY  AFP TUMOR MARKER  ANA  SEDIMENTATION RATE    Imaging Review US Abdomen Complete  02/23/2014   CLINICAL DATA:  Hepatic encephalopathy  EXAM: ULTRASOUND ABDOMEN COMPLETE  COMPARISON:  Ultrasound 07/03/2010  FINDINGS: Gallbladder: No gallstones or wall thickening visualized. No sonographic Murphy sign noted.  Common bile duct: Diameter: 9 mm in diameter mild prominent size  Liver: Diffuse increased echogenicity of the liver suspicious for fatty infiltration. There is a cyst in left hepatic lobe measures 1.3 x 1.2 cm.  IVC: No abnormality visualized.  Pancreas: Visualized portion unremarkable.  Spleen: Size and appearance within normal limits.  6.4 cm in length.  Right Kidney: Length: 10.5 cm. Echogenicity within normal limits. No mass or hydronephrosis visualized.  Left Kidney: Length: 12 cm. Echogenicity within normal limits. No mass or hydronephrosis visualized.  Abdominal aorta: No aneurysm visualized. Measures up to 1.7 cm in diameter. No abdominal ascites.  Other findings: None.  IMPRESSION: 1. No gallstones are noted within gallbladder. 2. CBD measures 9 mm in diameter mild prominent in size. No intrahepatic biliary ductal dilatation. 3. Mild increased echogenicity of the liver suspicious for fatty infiltration. There is a cyst in left hepatic lobe measures 1.3 cm. 4. No hydronephrosis.  No renal calculi. 5. No aortic aneurysm.   Electronically Signed   By: Lahoma Crocker M.D.   On: 02/23/2014 10:40   Dg Knee Complete 4 Views Right  02/21/2014   CLINICAL DATA:  Status post fall after total knee replacement on 11/19, redness/swelling  EXAM: RIGHT KNEE - COMPLETE 4+ VIEW  COMPARISON:  01/03/2014  FINDINGS: Right total knee arthroplasty in satisfactory position. No evidence of hardware complication or loosening.  No fracture or dislocation is seen.  Moderate suprapatellar knee joint effusion.  IMPRESSION: Right total knee arthroplasty in satisfactory position. No evidence of complication.   Moderate suprapatellar knee joint effusion.   Electronically Signed   By: Julian Hy M.D.   On: 02/21/2014 20:00     EKG Interpretation   Date/Time:  Tuesday February 21 2014 12:50:41 EST Ventricular Rate:  108 PR Interval:  173 QRS Duration: 94 QT Interval:  372 QTC Calculation: 499 R Axis:   -92 Text Interpretation:  Sinus tachycardia Probable left atrial enlargement  Left anterior fascicular block Probable RVH w/ secondary repol abnormality  Borderline prolonged QT interval Baseline wander in lead(s) V3 V4 V5 ED  PHYSICIAN INTERPRETATION AVAILABLE IN [facility] Confirmed by TEST,  Record ([id number]) on 02/23/2014 7:33:50 AM      MDM   Final diagnoses:  Shortness of breath  Altered mental status  UTI Elevated ammonia dehydration  Pt presenting with altered mental status- she is 2 months s/p right knee replacement. Pt with evidence of UTI- head CT negative, other labs reveal mild hypokalemia which is being repleted, mild elevation in ammonia- possible hepatic encephalopathy- remainder of LFTs are reassuring.  Doubt knee infection- no erythema or warmth of right knee.  WBC reassuring.  Pt with tachycardia, CT angio obtained and did not show PE.  Pt started on rocephin- blood and urine cultures obtained.  Pt treated with IV bolus as well.  Admitted  to triad for further management.      Threasa Beards, MD 02/23/14 [phone]

## 2014-02-21 NOTE — H&P (Addendum)
[facility] History and Physical  Denise Kelly:[id number] DOB: Feb 13, 1941 DOA: 02/21/2014  Referring physician:  PCP: Jonathon Bellows, MD   Chief Complaint:  Altered mental status  HPI:  73 year old morbidly obese female , status post knee arthroplasty , recently admitted 11/4 for altered mental status, right knee pain, urinary tract infection , presents to the ER with a similar presentation today. Apparently Dr. Ronnie Derby had examined the patient knee in November during this admission, and he did not suspect any infection. Patient was found to have Escherichia coli bacteremia  and Escherichia coli in her urine, treated with Rocephin in the hospital and subsequently amoxicillin. Patient developed chronic diarrhea for approximately a month. Now states that the diarrhea has resolved. Patient states that she has not had any diarrhea in 1 week.  Patient has had persistent right leg pain had a venous Doppler done 11/12 that did not show any evidence of DVT or Baker's cyst. Patient presents today  because of persistent right knee pain. The patient has not been ambulating, not getting out of bed, not eating or drinking. Apparently she has not showered since October. She has been missing her physical therapy appointment. She has become more restless and agitated. Daughter states that the patient had been drinking wine up until 5 days ago and suddenly stopped drinking. Patient denies any seizures, found to memory gaps but no significant confusion at the time of my interview. ER course- Ammonia level elevated. Does not want me to examine her right knee, states that it is too painful to touch. Patient found to have another urinary tract infection in the ER. CT of the head negative. CT angiogram chest negative.     Review of Systems: negative for the following  Constitutional: Denies fever, chills, diaphoresis, appetite change and fatigue.  HEENT: Denies photophobia, eye pain, redness, hearing loss,  ear pain, congestion, sore throat, rhinorrhea, sneezing, mouth sores, trouble swallowing, neck pain, neck stiffness and tinnitus.  Respiratory: Denies SOB, DOE, cough, chest tightness, and wheezing.  Cardiovascular: Denies chest pain, palpitations and leg swelling.  Gastrointestinal: Denies nausea, vomiting, abdominal pain, diarrhea, constipation, blood in stool and abdominal distention.  Genitourinary: Denies dysuria, urgency, frequency, hematuria, flank pain and difficulty urinating.  Musculoskeletal: Denies myalgias, back pain, joint swelling, arthralgias and gait problem.  Skin: Denies pallor, rash and wound.  Neurological altered mental status  dizziness, seizures, syncope, weakness, light-headedness, numbness and headaches.  Hematological: Denies adenopathy. Easy bruising, personal or family bleeding history  Psychiatric/Behavioral: Denies suicidal ideation, mood changes, confusion, nervousness, sleep disturbance and agitation       Past Medical History  Diagnosis Date  . Complication of anesthesia     "felt drunk for a week after"  . Hypertension     not being treated with medication  . Palpitations   . Anxiety   . GERD (gastroesophageal reflux disease)   . Arthritis   . Cancer     skin  . Alcohol abuse     ETOH and xanax     Past Surgical History  Procedure Laterality Date  . Tonsillectomy    . Tubal ligation    . 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 Breast surgery      2 breast biopsies  . Eye surgery Bilateral     cataracts  . Colonoscopy    . Total knee arthroplasty Right 01/02/2014    Dr Ronnie Derby  . Total knee arthroplasty Right 01/02/2014    Procedure:  TOTAL KNEE ARTHROPLASTY;  Surgeon: [name], MD;  Location: [facility];  Service: Orthopedics;  Laterality: Right;  . Joint replacement Left 09/2009    knee  . Joint replacement Right 01/02/2014      Social History:  reports that she has quit smoking. She has never used smokeless tobacco. She reports  that she drinks alcohol. She reports that she does not use illicit drugs.    Allergies  Allergen Reactions  . Sulfa Antibiotics Hives and Swelling  . Ciprofloxacin Itching  . Motrin [Ibuprofen] Palpitations    History reviewed. No pertinent family history.   Prior to Admission medications   Medication Sig Start Date End Date Taking? Authorizing Provider  acetaminophen (TYLENOL) 500 MG tablet Take 1,000 mg by mouth daily as needed for mild pain.   Yes Historical Provider, MD  citalopram (CELEXA) 10 MG tablet Take 10 mg by mouth at bedtime.  12/05/13  Yes Historical Provider, MD  furosemide (LASIX) 20 MG tablet Take 20 mg by mouth daily as needed for edema.  11/01/13  Yes Historical Provider, MD  gabapentin (NEURONTIN) 100 MG capsule Take 300 mg by mouth at bedtime.  10/31/13  Yes Historical Provider, MD  hydrOXYzine (ATARAX/VISTARIL) 25 MG tablet Take 50 mg by mouth at bedtime.  08/12/13  Yes Historical Provider, MD  loperamide (LOPERAMIDE A-D) 2 MG tablet Take 2 mg by mouth as needed for diarrhea or loose stools.   Yes Historical Provider, MD  loratadine (CLARITIN) 10 MG tablet Take 10 mg by mouth 4 (four) times daily as needed for itching.   Yes Historical Provider, MD  ondansetron (ZOFRAN-ODT) 4 MG disintegrating tablet Take 4 mg by mouth every 6 (six) hours as needed for nausea.  10/31/13  Yes Historical Provider, MD  OXYCONTIN 10 MG T12A 12 hr tablet Take 10 mg by mouth every 12 (twelve) hours.  02/14/14  Yes Historical Provider, MD  Phenazopyridine HCl (AZO TABS PO) Take 1-2 tablets by mouth daily as needed (vaginal atrophy).   Yes Historical Provider, MD  Polyethyl Glycol-Propyl Glycol (SYSTANE OP) Apply 1 drop to eye 3 (three) times daily as needed (dry eyes).   Yes Historical Provider, MD  polyethylene glycol (MIRALAX / GLYCOLAX) packet Take 17 g by mouth daily as needed for moderate constipation.    Yes Historical Provider, MD  potassium chloride SA (K-DUR,KLOR-CON) 20 MEQ tablet Take 1  tablet (20 mEq total) by mouth 2 (two) times daily. 01/20/14  Yes Carlynn Spry, PA-C  Pseudoeph-Doxylamine-DM-APAP (NIGHTTIME PO) Take 1 tablet by mouth at bedtime as needed (sleep).   Yes Historical Provider, MD  senna-docusate (SENOKOT-S) 8.6-50 MG per tablet Take 2 tablets by mouth at bedtime.   Yes Historical Provider, MD  zolpidem (AMBIEN) 10 MG tablet Take 10 mg by mouth at bedtime as needed for sleep.  02/16/14  Yes Historical Provider, MD  amoxicillin (AMOXIL) 500 MG tablet Take 1 tablet (500 mg total) by mouth 3 (three) times daily. Patient not taking: Reported on 02/21/2014 01/20/14   Carlynn Spry, PA-C  methocarbamol (ROBAXIN) 500 MG tablet Take 1-2 tablets (500-1,000 mg total) by mouth every 6 (six) hours as needed for muscle spasms. Patient not taking: Reported on 02/21/2014 01/05/14   Carlynn Spry, PA-C  traMADol (ULTRAM) 50 MG tablet Take 1-2 tablets (50-100 mg total) by mouth every 4 (four) hours as needed for moderate pain. Patient not taking: Reported on 02/21/2014 01/05/14   Carlynn Spry, PA-C     Physical Exam: Danley Danker Vitals:   02/21/14  1330 02/21/14 1437 02/21/14 1624 02/21/14 1730  BP: 137/65 161/108 139/86 148/93  Pulse: 108 104 108 105  Temp:      TempSrc:      Resp: 23 18 17 23   SpO2: 97% 100% 98% 97%    Physical Examination: General appearance - alert, restless appearing, and in no distress Mental status - alert, oriented to person, place, and time Eyes - pupils equal and reactive, extraocular eye movements intact Mouth - mucous membranes dry Chest - clear to auscultation, no wheezes, rales or rhonchi, symmetric air entry, pt with pursed lip breathing, restless but denies feeling short of breath Heart - tachycardic, regular rhythm, normal S1, S2, no murmurs, rubs, clicks or gallops Abdomen - soft, nontender, nondistended, no masses or organomegaly Neurological - alert, oriented x 3, cranial nerves 2-12 tested and intact, strength 5/5 in extremities x4, sensation  intact, restless appearing, seems forgetful of things discussed during brief conversations with me in the ED.  Extremities - peripheral pulses normal, no pedal edema, no clubbing or cyanosis Skin - normal coloration and turgor, no rashes Psych-restless, cooperative Psychiatric: Normal mood and affect. speech and behavior is normal. Judgment and thought content normal. Cognition and memory are normal.       Labs on Admission:    Basic Metabolic Panel:  Recent Labs Lab 02/21/14 1359  NA 143  K 3.3*  CL 98  CO2 22  GLUCOSE 137*  BUN 7  CREATININE 0.72  CALCIUM 10.1   Liver Function Tests:  Recent Labs Lab 02/21/14 1359  AST 34  ALT 17  ALKPHOS 142*  BILITOT 0.6  PROT 7.9  ALBUMIN 3.6   No results for input(s): LIPASE, AMYLASE in the last 168 hours.  Recent Labs Lab 02/21/14 1359  AMMONIA 74*   CBC:  Recent Labs Lab 02/21/14 1359 02/21/14 1835  WBC 7.1 7.5  HGB 16.7* 15.3*  HCT 46.8* 43.5  MCV 86.7 88.6  PLT 230 191   Cardiac Enzymes: No results for input(s): CKTOTAL, CKMB, CKMBINDEX, TROPONINI in the last 168 hours.  BNP (last 3 results) No results for input(s): PROBNP in the last 8760 hours.    CBG: No results for input(s): GLUCAP in the last 168 hours.  Radiological Exams on Admission: Dg Chest 2 View  02/21/2014   CLINICAL DATA:  Shortness of breath.  EXAM: CHEST  2 VIEW  COMPARISON:  January 17, 2014.  FINDINGS: The heart size and mediastinal contours are within normal limits. Both lungs are clear. No pneumothorax or pleural effusion is noted. The visualized skeletal structures are unremarkable.  IMPRESSION: No acute cardiopulmonary abnormality seen.   Electronically Signed   By: Sabino Dick M.D.   On: 02/21/2014 15:13   Ct Head Wo Contrast  02/21/2014   CLINICAL DATA:  Increasing confusion. Altered mental status. History of carotid cavernous sinus fistula.  EXAM: CT HEAD WITHOUT CONTRAST  TECHNIQUE: Contiguous axial images were obtained  from the base of the skull through the vertex without intravenous contrast.  COMPARISON:  None.  FINDINGS: There is extensive high density material in the region of the left cavernous sinus consistent with including from previous treatment for carotid cavernous sinus fistula. There is a small collection of the dense material in the left temporal fossa laterally. This material obscures some detail.  There is a tiny old lacunar infarct in the right basal ganglia. Brain parenchyma otherwise appears normal other than mild atrophy with secondary slight ventricular dilatation. There is no acute intracranial hemorrhage, infarction,  or mass lesion. Osseous structures are normal.  IMPRESSION: No acute abnormality. Previous treatment for left carotid cavernous sinus fistula. Tiny old lacunar infarct in the right basal ganglia.   Electronically Signed   By: Rozetta Nunnery M.D.   On: 02/21/2014 15:45   Ct Angio Chest Pe W/cm &/or Wo Cm  02/21/2014   CLINICAL DATA:  Short of breath  EXAM: CT ANGIOGRAPHY CHEST WITH CONTRAST  TECHNIQUE: Multidetector CT imaging of the chest was performed using the standard protocol during bolus administration of intravenous contrast. Multiplanar CT image reconstructions and MIPs were obtained to evaluate the vascular anatomy.  CONTRAST:  161mL OMNIPAQUE IOHEXOL 350 MG/ML SOLN  COMPARISON:  CT chest 04/23/2009  FINDINGS: The patient could not hold her breath and the image quality is degraded by significant motion.  Negative for pulmonary emboli. Small emboli could be missed on the study due to motion. Negative for aortic dissection. Mild atherosclerotic disease in the aorta. Coronary artery calcification. Mild cardiac enlargement without pericardial effusion.  Calcified granuloma right upper lobe unchanged from the prior CT. Negative for infiltrate or effusion. Negative for mass or adenopathy.  No acute abnormality in the upper abdomen. No acute thoracic spine abnormality.  Review of the MIP  images confirms the above findings.  IMPRESSION: Image quality degraded by motion from breathing  Negative for pulmonary embolism.  No acute abnormality.   Electronically Signed   By: Franchot Gallo M.D.   On: 02/21/2014 15:50    EKG: Independently reviewed.  Assessment/Plan Active Problems:   Altered mental status   SIRS (systemic inflammatory response syndrome)   Altered mental status Likely secondary to hepatic encephalopathy, ammonia level elevated Patient also has a urinary tract infection CT of the head negative No gross focal neurologic deficits, no further neurologic imaging indicated   Urinary tract infection Patient will be started on Rocephin, follow urine culture blood culture Probably did not have long enough treatment to completely treat her most recent episode of UTI in November   Diarrhea now resolved, because rule out C. difficile  Right knee pain EDP contacted , DR Ronnie Derby, but did not tear back Please try to contact him in the morning Right knee does not appear septic Will obtain plain x-rays    Alcohol abuse Patient will be started on CIWA protocol  Although I doubt that the patient will start withdrawing  as her last drink was 5 days ago  Failure to thrive Patient would benefit from long-term placement, will consult PT OT , social work consult  Code Status:   full Family Communication: bedside Disposition Plan: admit   Time spent: 70 mins   [facility] Pager [phone]  If 7PM-7AM, please contact night-coverage [email or web address] Password [facility] 02/21/2014, 6:53 PM

## 2014-02-21 NOTE — ED Notes (Signed)
Patient had refused to give urine sample

## 2014-02-21 NOTE — ED Notes (Signed)
Patient refused again to give urine sample

## 2014-02-21 NOTE — ED Notes (Signed)
Patient pulled the bed pan from under her and refused again

## 2014-02-21 NOTE — ED Notes (Addendum)
Pt c/o not feeling well, generalized body aches and diarrhea x "a long time."  Pain score 6/10.  Pt's daughter is concerned about increased confusion, "debilitating" depression, and poor diet x 1 week.  Sts her diet consists of wine and Coke.  Pt had R knee replacement in October and hasn't showered since.  Pt has not been to physical therapy for 1-1.5 weeks, because she refuses to get out of bed.  Pt has been seen previously at [facility] and admitted for diarrhea.  A & Ox4.

## 2014-02-21 NOTE — Progress Notes (Signed)
Utilization Review completed.  Ailey Wessling RN CM  

## 2014-02-21 NOTE — Progress Notes (Signed)
  CARE MANAGEMENT ED NOTE 02/21/2014  Patient:  VINEY, ACOCELLA   Account Number:  [id number]  Date Initiated:  02/21/2014  Documentation initiated by:  Livia Snellen  Subjective/Objective Assessment:   patient presents to Ed with increased confusion, decreased po intake.     Subjective/Objective Assessment Detail:   patient admitted to [facility] from 11/04 to 11/06 after total knee replacement.  Patient with alcohol abuse, anxiety, HTN, GERD,     Action/Plan:   Action/Plan Detail:   Anticipated DC Date:       Status Recommendation to Physician:   Result of Recommendation:    Other ED Services  Consult Working [place]  Other  CM consult    Choice offered to / List presented to:            Status of service:  Completed, signed off  ED Comments:   ED Comments Detail:  EDCM went to speak to patient at bedside in ED.  No family at bedside.  Patient appeared restlaess and anxious in the ED.  EDCM received patient's permission to call her daughter Charolette for discharge planning information. EDCM spoke to patient's daughter Charolette at 19pm [phone].  Patient's duaghter reports patient lives alone.  [name] reports patient lives in the same apartment complex as she does.  [name] reports she stayed with the patient for three weeks after her knee surgery.  The first night after surgery, patient fell as per daughter.  Patient went to [facility] for six days for physical therapy per daughter.  Patient was doing very well per daughter and then started to decline.  Patient has a cane, walker and a three in one at home.  Patient was supposed to have [place] for home health services, they came out once to see the patient and then patient cancelled [place] per patient's daughter.  Comfort keepers were then hired to help patient with ADL's, bathing etc, but patient cancelled them as well.  Patient's daugter reports the patient is usually AOx3, very  independent, able to take care of herself.  [name] reports the patient has not been bathing and she has found feces and urine on the floor at home or in the trash can because the patient reports she just couldn't make it, and has not been feeding her cats.  Patient 's daughter is concerned because of patient's drinking issue and believes she is extremely depressed.  [name] reports the patient refuses to take her Celexa and will not go to speak to a therapist. [name] reports the patient will not go to [place] meetings because, "They are too religious."  [name] reports she will coming to visit the patient in AM around 9am.  No further EDCM needs at this time.

## 2014-02-21 NOTE — ED Notes (Signed)
I just placed a bed pan under the patient to see if she can urinate

## 2014-02-21 NOTE — ED Notes (Signed)
Patients daughter has left bedside. Patient has cell phone and glasses with her.  Daughters contact: Baldo Ash [phone]

## 2014-02-22 ENCOUNTER — Inpatient Hospital Stay (HOSPITAL_COMMUNITY): Payer: Medicare Other

## 2014-02-22 DIAGNOSIS — F1024 Alcohol dependence with alcohol-induced mood disorder: Secondary | ICD-10-CM

## 2014-02-22 DIAGNOSIS — K7682 Hepatic encephalopathy: Secondary | ICD-10-CM | POA: Insufficient documentation

## 2014-02-22 DIAGNOSIS — F1020 Alcohol dependence, uncomplicated: Secondary | ICD-10-CM

## 2014-02-22 DIAGNOSIS — F1994 Other psychoactive substance use, unspecified with psychoactive substance-induced mood disorder: Secondary | ICD-10-CM

## 2014-02-22 DIAGNOSIS — K7290 Hepatic failure, unspecified without coma: Secondary | ICD-10-CM | POA: Insufficient documentation

## 2014-02-22 DIAGNOSIS — M25461 Effusion, right knee: Secondary | ICD-10-CM

## 2014-02-22 LAB — CBC
HCT: 39.2 % (ref 36.0–46.0)
HEMOGLOBIN: 13.2 g/dL (ref 12.0–15.0)
MCH: 30.1 pg (ref 26.0–34.0)
MCHC: 33.7 g/dL (ref 30.0–36.0)
MCV: 89.5 fL (ref 78.0–100.0)
PLATELETS: 153 10*3/uL (ref 150–400)
RBC: 4.38 MIL/uL (ref 3.87–5.11)
RDW: 13.7 % (ref 11.5–15.5)
WBC: 5.6 10*3/uL (ref 4.0–10.5)

## 2014-02-22 LAB — COMPREHENSIVE METABOLIC PANEL
ALT: 10 U/L (ref 0–35)
AST: 21 U/L (ref 0–37)
Albumin: 2.7 g/dL — ABNORMAL LOW (ref 3.5–5.2)
Alkaline Phosphatase: 103 U/L (ref 39–117)
Anion gap: 11 (ref 5–15)
BILIRUBIN TOTAL: 0.5 mg/dL (ref 0.3–1.2)
BUN: 5 mg/dL — ABNORMAL LOW (ref 6–23)
CALCIUM: 8.2 mg/dL — AB (ref 8.4–10.5)
CHLORIDE: 101 meq/L (ref 96–112)
CO2: 25 meq/L (ref 19–32)
Creatinine, Ser: 0.75 mg/dL (ref 0.50–1.10)
GFR, EST NON AFRICAN AMERICAN: 83 mL/min — AB (ref >90)
GLUCOSE: 112 mg/dL — AB (ref 70–99)
Potassium: 2.9 mEq/L — CL (ref 3.7–5.3)
Sodium: 137 mEq/L (ref 137–147)
Total Protein: 5.8 g/dL — ABNORMAL LOW (ref 6.0–8.3)

## 2014-02-22 LAB — SEDIMENTATION RATE: Sed Rate: 9 mm/hr (ref 0–22)

## 2014-02-22 LAB — C-REACTIVE PROTEIN: CRP: 0.7 mg/dL — ABNORMAL HIGH (ref <0.60)

## 2014-02-22 LAB — CLOSTRIDIUM DIFFICILE BY PCR: Toxigenic C. Difficile by PCR: NEGATIVE

## 2014-02-22 LAB — TROPONIN I

## 2014-02-22 LAB — AMMONIA
AMMONIA: 43 umol/L (ref 11–60)
AMMONIA: 10 umol/L — AB (ref 11–60)

## 2014-02-22 MED ORDER — POTASSIUM CHLORIDE 10 MEQ/100ML IV SOLN
10.0000 meq | INTRAVENOUS | Status: AC
  Administered 2014-02-22 (×2): 10 meq via INTRAVENOUS
  Filled 2014-02-22 (×2): qty 100

## 2014-02-22 NOTE — Evaluation (Signed)
Physical Therapy Evaluation Patient Details Name: Denise Kelly MRN: [id number] DOB: 1940/11/22 Today's Date: 02/22/2014   History of Present Illness  Pt is a 73 year old morbidly obese female admitted with persistent knee pain and AMS likely secondary to hepatic encephalopathy.  She had R TKA on 01/02/14 and has a h/o ETOH    Clinical Impression  Pt currently with functional limitations due to the deficits listed below (see PT Problem List).  Pt will benefit from skilled PT to increase their independence and safety with mobility to allow discharge to the venue listed below.  Pt assisted with ambulation in hallway, requiring use of RW and with step to gait pattern.  Pt may benefit from ST-SNF upon d/c as daughter reports pt was doing very well and then started declining in function.     Follow Up Recommendations SNF;Supervision for mobility/OOB    Equipment Recommendations  None recommended by PT    Recommendations for Other Services       Precautions / Restrictions Precautions Precautions: Fall Restrictions Weight Bearing Restrictions: No      Mobility  Bed Mobility Overal bed mobility: Modified Independent             General bed mobility comments: pt up to recliner with OT  Transfers Overall transfer level: Needs assistance Equipment used: Rolling walker (2 wheeled) Transfers: Sit to/from Stand Sit to Stand: Min guard         General transfer comment: verbal cues for hand placement  Ambulation/Gait Ambulation/Gait assistance: Min guard Ambulation Distance (Feet): 60 Feet Assistive device: Rolling walker (2 wheeled) Gait Pattern/deviations: Step-to pattern;Antalgic     General Gait Details: slow pace, step to pattern, unable to tolerate further distance due to fatigue  Stairs            Wheelchair Mobility    Modified Rankin (Stroke Patients Only)       Balance                                             Pertinent  Vitals/Pain Pain Assessment: No/denies pain (none at rest) Pain Score: 5  (at rest) Pain Location: R knee hurts with movement per pt however not rated Pain Descriptors / Indicators: Sore Pain Intervention(s): Limited activity within patient's tolerance;Monitored during session;Repositioned;Ice applied;Patient requesting pain meds-RN notified    Home Living Family/patient expects to be discharged to:: Private residence Living Arrangements: Alone Available Help at Discharge: Family;Available PRN/intermittently Type of Home: House Home Access: Level entry     Home Layout: One level Home Equipment: Walker - 2 wheels;Bedside commode Additional Comments: pt lives alone    Prior Function Level of Independence: Needs assistance   Gait / Transfers Assistance Needed: daughter reports she was ambulating around stores however started to decline and has not been ambulating much and now using RW      Comments: pt reports she is back using RW.  She hasn't showered since Oct as she has a tub and did not feel safe getting into this     Hand Dominance        Extremity/Trunk Assessment   Upper Extremity Assessment: Generalized weakness           Lower Extremity Assessment: Generalized weakness;RLE deficits/detail RLE Deficits / Details: at least 100* knee flexion sitting in recliner active movement, pt requested not to have knee touched  Communication   Communication: No difficulties  Cognition Arousal/Alertness: Awake/alert Behavior During Therapy: WFL for tasks assessed/performed Overall Cognitive Status: Impaired/Different from baseline Area of Impairment: Memory     Memory: Decreased short-term memory              General Comments      Exercises        Assessment/Plan    PT Assessment Patient needs continued PT services  PT Diagnosis Difficulty walking   PT Problem List Decreased strength;Decreased range of motion;Decreased mobility;Pain  PT Treatment  Interventions DME instruction;Gait training;Functional mobility training;Therapeutic activities;Therapeutic exercise;Patient/family education   PT Goals (Current goals can be found in the Care Plan section) Acute Rehab PT Goals Patient Stated Goal: take a shower:  my daughter wants me to PT Goal Formulation: With patient Time For Goal Achievement: 03/01/14 Potential to Achieve Goals: Good    Frequency Min 3X/week   Barriers to discharge        Co-evaluation               End of Session Equipment Utilized During Treatment: Gait belt Activity Tolerance: Patient limited by fatigue Patient left: in chair;with call bell/phone within reach;with chair alarm set;with family/visitor present           Time: 1435-1454 PT Time Calculation (min) (ACUTE ONLY): 19 min   Charges:   PT Evaluation $Initial PT Evaluation Tier I: 1 Procedure PT Treatments $Gait Training: 8-22 mins   PT G Codes:          Keshun Berrett,KATHrine E 02/22/2014, 4:08 PM Carmelia Bake, PT, DPT 02/22/2014 Pager: [phone]

## 2014-02-22 NOTE — Plan of Care (Signed)
Problem: Phase I Progression Outcomes Goal: Pain controlled with appropriate interventions Outcome: Progressing Goal: Dangle or out of bed evening of surgery Outcome: Completed/Met Date Met:  02/22/14 Goal: Initial discharge plan identified Outcome: Progressing Goal: Hemodynamically stable Outcome: Completed/Met Date Met:  02/22/14  Problem: Phase II Progression Outcomes Goal: Ambulates Outcome: Completed/Met Date Met:  02/22/14 Goal: Tolerating diet Outcome: Completed/Met Date Met:  02/22/14 Goal: Discharge plan established Outcome: Progressing Goal: Other Phase II Outcomes/Goals Outcome: Completed/Met Date Met:  02/22/14

## 2014-02-22 NOTE — Evaluation (Signed)
Occupational Therapy Evaluation Patient Details Name: Denise Kelly MRN: [id number] DOB: [birth year] Today's Date: 02/22/2014    History of Present Illness Pt was admitted with persistent knee pain.  She was recently admitted for AMS, R knee pain, uti.  She had R TKA on 01/02/14 and has a h/o ETOH   Clinical Impression   This 73 year old female was admitted for the above.  She will benefit from skilled OT to increase safety and independence with adls.  Goals in acute are set at min guard to supervision level. She needs min A overall at this time. Pt was mod I with adls until recently, and then needed more assistance due to pain.    Follow Up Recommendations  SNF;Supervision/Assistance - 24 hour    Equipment Recommendations  None recommended by OT    Recommendations for Other Services       Precautions / Restrictions Precautions Precautions: Fall Restrictions Weight Bearing Restrictions: No      Mobility Bed Mobility Overal bed mobility: Modified Independent             General bed mobility comments: HOB raised, and pt used bedrail  Transfers Overall transfer level: Needs assistance Equipment used:  (hand held assistance) Transfers: Sit to/from Stand Sit to Stand: Min assist         General transfer comment: for balance    Balance                                            ADL Overall ADL's : Needs assistance/impaired             Lower Body Bathing: Minimal assistance;Sit to/from stand       Lower Body Dressing: Minimal assistance;Sit to/from stand   Toilet Transfer: Minimal assistance;Stand-pivot (to recliner, hand held assistance)             General ADL Comments: Pt is able to complete UB adls with set up.  She and daughter were asking if she could take a shower.  RN was going to text page MD to ask.  Pt with tele monitor and IV.  Pt followed all commands and answered questions with delay at times.  OT had walker, but pt  felt she could SPT to chair without this.  Min A given for balance     Vision                     Perception     Praxis      Pertinent Vitals/Pain Pain Assessment: 0-10 Pain Score: 5  (at rest) Pain Location: R knee Pain Descriptors / Indicators: Sore Pain Intervention(s): Limited activity within patient's tolerance;Monitored during session;Repositioned     Hand Dominance     Extremity/Trunk Assessment Upper Extremity Assessment Upper Extremity Assessment: Generalized weakness          Communication Communication Communication: No difficulties   Cognition Arousal/Alertness: Awake/alert Behavior During Therapy: WFL for tasks assessed/performed Overall Cognitive Status: Impaired/Different from baseline Area of Impairment: Memory     Memory: Decreased short-term memory             General Comments       Exercises       Shoulder Instructions      Home Living  Additional Comments: pt lives alone.  Has BSC, cane and RW      Prior Functioning/Environment Level of Independence: Needs assistance        Comments: pt reports she is back using RW.  She hasn't showered since Oct as she has a tub and did not feel safe getting into this    OT Diagnosis: Acute pain;Generalized weakness   OT Problem List: Decreased strength;Decreased activity tolerance;Impaired balance (sitting and/or standing);Decreased cognition;Pain   OT Treatment/Interventions: Self-care/ADL training;DME and/or AE instruction;Patient/family education;Balance training;Therapeutic activities    OT Goals(Current goals can be found in the care plan section) Acute Rehab OT Goals Patient Stated Goal: take a shower:  my daughter wants me to OT Goal Formulation: With patient Time For Goal Achievement: 03/08/14 Potential to Achieve Goals: Good ADL Goals Pt Will Perform Grooming: with supervision;standing Pt Will Perform Lower Body  Bathing: with supervision;sit to/from stand Pt Will Perform Lower Body Dressing: with supervision;sit to/from stand Pt Will Transfer to Toilet: ambulating;bedside commode;with min guard assist Pt Will Perform Toileting - Clothing Manipulation and hygiene: with supervision;sit to/from stand  OT Frequency: Min 2X/week   Barriers to D/C:            Co-evaluation              End of Session Nurse Communication:  (pt requesting shower)  Activity Tolerance: Patient tolerated treatment well Patient left: in chair;with call bell/phone within reach;with chair alarm set;with family/visitor present   Time: 1561-5379 OT Time Calculation (min): 16 min Charges:  OT General Charges $OT Visit: 1 Procedure OT Evaluation $Initial OT Evaluation Tier I: 1 Procedure OT Treatments $Therapeutic Activity: 8-22 mins G-Codes:    Pellegrino Kennard 03/22/2014, 3:48 PM  Lesle Chris, OTR/L [phone] 03-22-2014

## 2014-02-22 NOTE — Progress Notes (Signed)
CRITICAL VALUE ALERT  Critical value received:  K+ 2.9  Date of notification:  02/22/14  Time of notification:  0728  Critical value read back:Yes.    Nurse who received alert:  Catie Despina Pole  MD notified (1st page):  Dr. Carles Collet  Time of first page:  0732  MD notified (2nd page):  Time of second page:  Responding MD:  Dr. Carles Collet  Time MD responded:  [phone]

## 2014-02-22 NOTE — Progress Notes (Signed)
Nutrition Brief Note  Patient identified on the Malnutrition Screening Tool (MST) Report  Pt reports no weight loss, "I haven't lost an ounce". Per weight history documentation, pt has lost 17 lb since November. Pt reports eating eggs with breakfast and she was full so she skipped lunch. Plans to have dinner around 4pm today.   Pt wound benefit from nutritional supplement. Pt declines nutritional supplementation at this time. States that vitamins make her sick.  Encouraged pt to consume meals and snacks if needed.  Wt Readings from Last 15 Encounters:  02/21/14 200 lb (90.719 kg)  01/19/14 217 lb 4.9 oz (98.57 kg)  01/04/14 219 lb 12.8 oz (99.701 kg)  12/29/13 219 lb 12.8 oz (99.701 kg)  11/10/13 219 lb (99.338 kg)    Body mass index is 30.42 kg/(m^2). Patient meets criteria for obesity based on current BMI.   Current diet order is regular, patient is consuming approximately 50% of meals at this time. Labs and medications reviewed.   No nutrition interventions warranted at this time. If nutrition issues arise, please consult RD.    Clayton Bibles, MS, RD, LDN Pager: [phone] After Hours Pager: [phone]

## 2014-02-22 NOTE — Care Management Note (Addendum)
    Page 1 of 1   02/24/2014     2:52:49 PM CARE MANAGEMENT NOTE 02/24/2014  Patient:  Denise Kelly, Denise Kelly   Account Number:  [id number]  Date Initiated:  02/22/2014  Documentation initiated by:  Dessa Phi  Subjective/Objective Assessment:   73 Y/O F ADMITTED W/AMS.ACUTE ENCEPHALOPATHY.ME:QAST.     Action/Plan:   FROM HOME ALONE.SEE ED CM NOTE.   Anticipated DC Date:  02/24/2014   Anticipated DC Plan:  [place]  CM consult      Choice offered to / List presented to:             Status of service:  In process, will continue to follow Medicare Important Message given?  YES (If response is "NO", the following Medicare IM given date fields will be blank) Date Medicare IM given:  02/24/2014 Medicare IM given by:  [facility] Date Additional Medicare IM given:   Additional Medicare IM given by:    Discharge Disposition:    Per UR Regulation:  Reviewed for med. necessity/level of care/duration of stay  If discussed at [place] of Stay Meetings, dates discussed:    Comments:  02/23/14 Dessa Phi RN BSN NCM [phone] PT-snf.Psych cons-etoh,depression.Monitor progress. d/c plan-[facility].  02/22/14 Roshan Salamon RN,BSN NCM [phone] SEE ED CM NOTE.PT ORDERED-AWAIT EVAL & RECOMMENDATIONS.

## 2014-02-22 NOTE — Progress Notes (Signed)
OT Cancellation Note  Patient Details Name: Denise Kelly MRN: [id number] DOB: 1940-07-15   Cancelled Treatment:    Reason Eval/Treat Not Completed: Other (comment).  Checked with RN.  Pt is getting runs of K+ due to low value.  Will try to reattempt in pm.  Kacy Hegna 02/22/2014, 9:41 AM  Lesle Chris, OTR/L [phone] 02/22/2014

## 2014-02-22 NOTE — Progress Notes (Signed)
PROGRESS NOTE  Denise Kelly CHE:[id number] DOB: [birth year] DOA: 02/21/2014 PCP: [name], D, MD    Assessment/Plan: Acute encephalopathy -Multifactorial including hepatic encephalopathy  with possibility of alcohol withdrawal and infectious process -Check for reversible causes of delirium including TSH, serum B12, RBC folate Hepatic encephalopathy -Mentation has improved with lactulose and decreasing ammonia -Ammonia improved from 74--> 43 -Abdominal ultrasound to evaluate the liver and for ascites -Hepatitis B surface antigen, hepatitis C antibody, ANA Right knee pain/right knee effusion -Status post right knee TKA-01/02/2014 -X-ray reveals suprapatellar effusion -Given the patient's recent bacteremia with [facility] in Nov. 2015, concerned about possible seeding infection -spoke with Dr. Ronnie Derby about possible arthrocentesis--send for cell count and culture -d/c abx for now to maximize cultures Alcohol abuse -Alcohol withdrawal protocol -Last drink was possibly 5-6 days ago Depression/substance induced mood disorder -Consult psychiatry -Patient states that Celexa is not working well for her -Daughter request a psychiatry evaluation which I have called    Family Communication:   Daughter updated at beside Disposition Plan:   SNF when medically stable       Procedures/Studies: Dg Chest 2 View  02/21/2014   CLINICAL DATA:  Shortness of breath.  EXAM: CHEST  2 VIEW  COMPARISON:  January 17, 2014.  FINDINGS: The heart size and mediastinal contours are within normal limits. Both lungs are clear. No pneumothorax or pleural effusion is noted. The visualized skeletal structures are unremarkable.  IMPRESSION: No acute cardiopulmonary abnormality seen.   Electronically Signed   By: Sabino Dick M.D.   On: 02/21/2014 15:13   Ct Head Wo Contrast  02/21/2014   CLINICAL DATA:  Increasing confusion. Altered mental status. History of carotid cavernous sinus fistula.  EXAM: CT  HEAD WITHOUT CONTRAST  TECHNIQUE: Contiguous axial images were obtained from the base of the skull through the vertex without intravenous contrast.  COMPARISON:  None.  FINDINGS: There is extensive high density material in the region of the left cavernous sinus consistent with including from previous treatment for carotid cavernous sinus fistula. There is a small collection of the dense material in the left temporal fossa laterally. This material obscures some detail.  There is a tiny old lacunar infarct in the right basal ganglia. Brain parenchyma otherwise appears normal other than mild atrophy with secondary slight ventricular dilatation. There is no acute intracranial hemorrhage, infarction, or mass lesion. Osseous structures are normal.  IMPRESSION: No acute abnormality. Previous treatment for left carotid cavernous sinus fistula. Tiny old lacunar infarct in the right basal ganglia.   Electronically Signed   By: Rozetta Nunnery M.D.   On: 02/21/2014 15:45   Ct Angio Chest Pe W/cm &/or Wo Cm  02/21/2014   CLINICAL DATA:  Short of breath  EXAM: CT ANGIOGRAPHY CHEST WITH CONTRAST  TECHNIQUE: Multidetector CT imaging of the chest was performed using the standard protocol during bolus administration of intravenous contrast. Multiplanar CT image reconstructions and MIPs were obtained to evaluate the vascular anatomy.  CONTRAST:  152mL OMNIPAQUE IOHEXOL 350 MG/ML SOLN  COMPARISON:  CT chest 04/23/2009  FINDINGS: The patient could not hold her breath and the image quality is degraded by significant motion.  Negative for pulmonary emboli. Small emboli could be missed on the study due to motion. Negative for aortic dissection. Mild atherosclerotic disease in the aorta. Coronary artery calcification. Mild cardiac enlargement without pericardial effusion.  Calcified granuloma right upper lobe unchanged from the prior CT. Negative for infiltrate or  effusion. Negative for mass or adenopathy.  No acute abnormality in the  upper abdomen. No acute thoracic spine abnormality.  Review of the MIP images confirms the above findings.  IMPRESSION: Image quality degraded by motion from breathing  Negative for pulmonary embolism.  No acute abnormality.   Electronically Signed   By: Franchot Gallo M.D.   On: 02/21/2014 15:50   Dg Knee Complete 4 Views Right  02/21/2014   CLINICAL DATA:  Status post fall after total knee replacement on 11/19, redness/swelling  EXAM: RIGHT KNEE - COMPLETE 4+ VIEW  COMPARISON:  01/03/2014  FINDINGS: Right total knee arthroplasty in satisfactory position. No evidence of hardware complication or loosening.  No fracture or dislocation is seen.  Moderate suprapatellar knee joint effusion.  IMPRESSION: Right total knee arthroplasty in satisfactory position. No evidence of complication.  Moderate suprapatellar knee joint effusion.   Electronically Signed   By: Julian Hy M.D.   On: 02/21/2014 20:00         Subjective: Patient states that the right knee pain and swelling are low but better than yesterday. She is more lucid than yesterday. She denies any fevers, chills, chest pain, shortness breath, nausea, vomiting, diarrhea. She is having loose stools from the lactulose. No abdominal pain.  Objective: Filed Vitals:   02/21/14 1730 02/21/14 2052 02/22/14 0424 02/22/14 1348  BP: 148/93 160/92 159/85 145/82  Pulse: 105 99 94 95  Temp:  97.7 F (36.5 C) 97.9 F (36.6 C) 97.5 F (36.4 C)  TempSrc:  Oral Oral Oral  Resp: 23 22 16 18   Height:  5\' 8"  (1.727 m)    Weight:  90.719 kg (200 lb)    SpO2: 97% 98% 97% 97%    Intake/Output Summary (Last 24 hours) at 02/22/14 1601 Last data filed at 02/22/14 1500  Gross per 24 hour  Intake   2075 ml  Output   1050 ml  Net   1025 ml   Weight change:  Exam:   General:  Pt is alert, follows commands appropriately, not in acute distress  HEENT: No icterus, No thrush, [place]/AT  Cardiovascular: RRR, S1/S2, no rubs, no gallops  Respiratory:  Bibasilar crackles. No wheezing. Good air movement  Abdomen: Soft/+BS, non tender, non distended, no guarding  Extremities: 1+LE edema edema, No lymphangitis, No petechiae, No rashes, right knee effusion without any external erythema or crepitance.  Data Reviewed: Basic Metabolic Panel:  Recent Labs Lab 02/21/14 1359 02/21/14 1835 02/22/14 0620  NA 143  --  137  K 3.3*  --  2.9*  CL 98  --  101  CO2 22  --  25  GLUCOSE 137*  --  112*  BUN 7  --  5*  CREATININE 0.72 0.74 0.75  CALCIUM 10.1  --  8.2*  MG  --  1.7  --    Liver Function Tests:  Recent Labs Lab 02/21/14 1359 02/22/14 0620  AST 34 21  ALT 17 10  ALKPHOS 142* 103  BILITOT 0.6 0.5  PROT 7.9 5.8*  ALBUMIN 3.6 2.7*   No results for input(s): LIPASE, AMYLASE in the last 168 hours.  Recent Labs Lab 02/21/14 1359 02/22/14 0620  AMMONIA 74* 43   CBC:  Recent Labs Lab 02/21/14 1359 02/21/14 1835 02/22/14 0620  WBC 7.1 7.5 5.6  HGB 16.7* 15.3* 13.2  HCT 46.8* 43.5 39.2  MCV 86.7 88.6 89.5  PLT 230 191 153   Cardiac Enzymes:  Recent Labs Lab 02/21/14 1835 02/22/14 0020  02/22/14 0620  TROPONINI <0.30 <0.30 <0.30   BNP: Invalid input(s): POCBNP CBG: No results for input(s): GLUCAP in the last 168 hours.  Recent Results (from the past 240 hour(s))  Blood culture (routine x 2)     Status: None (Preliminary result)   Collection Time: 02/21/14  5:03 PM  Result Value Ref Range Status   Specimen Description BLOOD LEFT ANTECUBITAL  Final   Special Requests BOTTLES DRAWN AEROBIC AND ANAEROBIC 5CC  Final   Culture  Setup Time   Final    02/21/2014 22:41 Performed at [facility]    Culture   Final           BLOOD CULTURE RECEIVED NO GROWTH TO DATE CULTURE WILL BE HELD FOR 5 DAYS BEFORE ISSUING A FINAL NEGATIVE REPORT Performed at [facility]    Report Status PENDING  Incomplete  Blood culture (routine x 2)     Status: None (Preliminary result)   Collection Time: 02/21/14   5:05 PM  Result Value Ref Range Status   Specimen Description BLOOD LEFT ANTECUBITAL  Final   Special Requests BOTTLES DRAWN AEROBIC AND ANAEROBIC 5CC  Final   Culture  Setup Time   Final    02/21/2014 22:41 Performed at [facility]    Culture   Final           BLOOD CULTURE RECEIVED NO GROWTH TO DATE CULTURE WILL BE HELD FOR 5 DAYS BEFORE ISSUING A FINAL NEGATIVE REPORT Performed at [facility]    Report Status PENDING  Incomplete  Clostridium Difficile by PCR     Status: None   Collection Time: 02/21/14  9:00 PM  Result Value Ref Range Status   C difficile by pcr NEGATIVE NEGATIVE Final    Comment: Performed at [facility]     Scheduled Meds: . citalopram  10 mg Oral QHS  . enoxaparin (LOVENOX) injection  40 mg Subcutaneous Q24H  . folic acid  1 mg Oral Daily  . gabapentin  300 mg Oral QHS  . hydrOXYzine  50 mg Oral QHS  . lactulose  10 g Oral TID  . LORazepam  0-4 mg Oral Q6H   Followed by  . [START ON 02/23/2014] LORazepam  0-4 mg Oral Q12H  . multivitamin with minerals  1 tablet Oral Daily  . potassium chloride SA  20 mEq Oral BID  . sodium chloride  3 mL Intravenous Q12H  . thiamine  100 mg Oral Daily   Or  . thiamine  100 mg Intravenous Daily   Continuous Infusions: . sodium chloride 100 mL/hr at 02/22/14 0109     Caroleen Stoermer, DO  [facility] Pager [phone]  If 7PM-7AM, please contact night-coverage [email or web address] Password [email or web address] 02/22/2014, 4:01 PM   LOS: 1 day

## 2014-02-22 NOTE — Progress Notes (Signed)
SPORTS MEDICINE AND JOINT REPLACEMENT  Lara Mulch, MD   Carlynn Spry, PA-C [place], [place], [place]  [address]                             [phone]   PROGRESS NOTE  Subjective:  negative for Chest Pain  negative for Shortness of Breath  negative for Nausea/Vomiting   negative for Calf Pain  negative for Bowel Movement   Tolerating Diet: yes         Patient reports pain as 5 on 0-10 scale.    Objective: Vital signs in last 24 hours:   Patient Vitals for the past 24 hrs:  BP Temp Temp src Pulse Resp SpO2 Height Weight  02/22/14 1348 (!) 145/82 mmHg 97.5 F (36.4 C) Oral 95 18 97 % - -  02/22/14 0424 (!) 159/85 mmHg 97.9 F (36.6 C) Oral 94 16 97 % - -  02/21/14 2052 (!) 160/92 mmHg 97.7 F (36.5 C) Oral 99 (!) 22 98 % 5\' 8"  (1.727 m) 90.719 kg (200 lb)    @flow {1959:LAST@   Intake/Output from previous day:   12/08 0701 - 12/09 0700 In: 2175 [I.V.:1125] Out: 500 [Urine:500]   Intake/Output this shift:   12/09 0701 - 12/09 1900 In: 900 [I.V.:900] Out: 1050 [Urine:1050]   Intake/Output      12/08 0701 - 12/09 0700 12/09 0701 - 12/10 0700   I.V. (mL/kg) 1125 (12.4) 900 (9.9)   IV Piggyback 1050    Total Intake(mL/kg) 2175 (24) 900 (9.9)   Urine (mL/kg/hr) 500 1050 (1)   Total Output 500 1050   Net +1675 -150        Urine Occurrence  2 x   Stool Occurrence 1 x 1 x      LABORATORY DATA:  Recent Labs  02/21/14 1359 02/21/14 1835 02/22/14 0620  WBC 7.1 7.5 5.6  HGB 16.7* 15.3* 13.2  HCT 46.8* 43.5 39.2  PLT 230 191 153    Recent Labs  02/21/14 1359 02/21/14 1835 02/22/14 0620  NA 143  --  137  K 3.3*  --  2.9*  CL 98  --  101  CO2 22  --  25  BUN 7  --  5*  CREATININE 0.72 0.74 0.75  GLUCOSE 137*  --  112*  CALCIUM 10.1  --  8.2*   Lab Results  Component Value Date   INR 1.24 01/18/2014   INR 1.16 12/29/2013   INR 0.97 09/20/2009    Examination:  General appearance: alert, cooperative and no distress Extremities:  extremities normal, atraumatic, no cyanosis or edema and Homans sign is negative, no sign of DVT  Wound Exam: clean, dry, intact   Drainage:  None: wound tissue dry  Motor Exam: EHL Intact  Sensory Exam: Deep Peroneal normal   Assessment:          ADDITIONAL DIAGNOSIS:  Active Problems:   Right knee pain   Altered mental status   SIRS (systemic inflammatory response syndrome)   Effusion of right knee   Alcohol dependence   Substance induced mood disorder   Hepatic encephalopathy     Plan: Physical Therapy as ordered Weight Bearing as Tolerated (WBAT)  DVT Prophylaxis:  None  Crp, sed rate         Amillion Macchia 02/22/2014, 6:15 PM

## 2014-02-22 NOTE — Plan of Care (Signed)
Problem: Phase I Progression Outcomes Goal: Pain controlled with appropriate interventions Outcome: Completed/Met Date Met:  02/22/14 Goal: OOB as tolerated unless otherwise ordered Outcome: Completed/Met Date Met:  02/22/14 Goal: Initial discharge plan identified Outcome: Completed/Met Date Met:  02/22/14 Goal: Voiding-avoid urinary catheter unless indicated Outcome: Completed/Met Date Met:  02/22/14 Goal: Hemodynamically stable Outcome: Completed/Met Date Met:  02/22/14     

## 2014-02-22 NOTE — Plan of Care (Signed)
Problem: Phase I Progression Outcomes Goal: CMS/Neurovascular status WDL Outcome: Completed/Met Date Met:  02/22/14 Goal: Other Phase I Outcomes/Goals Outcome: Not Applicable Date Met:  14/43/60

## 2014-02-23 ENCOUNTER — Inpatient Hospital Stay (HOSPITAL_COMMUNITY): Payer: Medicare Other

## 2014-02-23 DIAGNOSIS — F331 Major depressive disorder, recurrent, moderate: Secondary | ICD-10-CM

## 2014-02-23 DIAGNOSIS — F1019 Alcohol abuse with unspecified alcohol-induced disorder: Secondary | ICD-10-CM

## 2014-02-23 LAB — BASIC METABOLIC PANEL
Anion gap: 10 (ref 5–15)
BUN: 5 mg/dL — AB (ref 6–23)
CO2: 27 mEq/L (ref 19–32)
CREATININE: 0.8 mg/dL (ref 0.50–1.10)
Calcium: 8.4 mg/dL (ref 8.4–10.5)
Chloride: 107 mEq/L (ref 96–112)
GFR calc non Af Amer: 72 mL/min — ABNORMAL LOW (ref >90)
GFR, EST AFRICAN AMERICAN: 83 mL/min — AB (ref >90)
Glucose, Bld: 109 mg/dL — ABNORMAL HIGH (ref 70–99)
Potassium: 3.2 mEq/L — ABNORMAL LOW (ref 3.7–5.3)
Sodium: 144 mEq/L (ref 137–147)

## 2014-02-23 LAB — AFP TUMOR MARKER: AFP TUMOR MARKER: 5.6 ng/mL (ref <6.1)

## 2014-02-23 LAB — HEPATITIS C ANTIBODY: HCV Ab: NEGATIVE

## 2014-02-23 LAB — ANA: Anti Nuclear Antibody(ANA): NEGATIVE

## 2014-02-23 LAB — HEPATITIS B SURFACE ANTIGEN: HEP B S AG: NEGATIVE

## 2014-02-23 MED ORDER — CITALOPRAM HYDROBROMIDE 20 MG PO TABS
20.0000 mg | ORAL_TABLET | Freq: Every day | ORAL | Status: DC
  Administered 2014-02-23: 20 mg via ORAL
  Filled 2014-02-23: qty 1

## 2014-02-23 MED ORDER — TRAZODONE HCL 50 MG PO TABS
50.0000 mg | ORAL_TABLET | Freq: Every day | ORAL | Status: DC
  Administered 2014-02-23: 50 mg via ORAL
  Filled 2014-02-23: qty 1

## 2014-02-23 NOTE — Plan of Care (Signed)
Problem: Phase II Progression Outcomes Goal: Progress activity as tolerated unless otherwise ordered Outcome: Progressing Goal: Discharge plan established Outcome: Completed/Met Date Met:  02/23/14 Goal: Vital signs remain stable Outcome: Completed/Met Date Met:  02/23/14 Goal: IV changed to normal saline lock Outcome: Progressing  Problem: Phase III Progression Outcomes Goal: Pain controlled on oral analgesia Outcome: Progressing Goal: Activity at appropriate level-compared to baseline (UP IN CHAIR FOR HEMODIALYSIS)  Outcome: Progressing Goal: Voiding independently Outcome: Completed/Met Date Met:  02/23/14

## 2014-02-23 NOTE — Plan of Care (Signed)
Problem: Phase II Progression Outcomes Goal: Progress activity as tolerated unless otherwise ordered Outcome: Progressing Goal: Discharge plan established Outcome: Progressing Goal: Obtain order to discontinue catheter if appropriate Outcome: Not Applicable Date Met:  66/29/47

## 2014-02-23 NOTE — Progress Notes (Signed)
OT Cancellation Note  Patient Details Name: Denise Kelly MRN: [id number] DOB: Nov 05, 1940   Cancelled Treatment:    Reason Eval/Treat Not Completed: Patient at procedure or test/ unavailable  Darlina Rumpf [place], OTR/L [phone]   02/23/2014, 11:16 AM

## 2014-02-23 NOTE — Progress Notes (Signed)
PROGRESS NOTE  Denise Kelly NOT:[id number] DOB: 1941/03/04 DOA: 02/21/2014 PCP: [name], D, MD  Assessment/Plan: Acute encephalopathy -Multifactorial including hepatic encephalopathy  with possibility of alcohol withdrawal and infectious process -back to baseline per daughter -Check serum B12, RBC folate Hepatic encephalopathy -Mentation has improved with lactulose and decreasing ammonia -Ammonia improved from 74--> 43-->10 -Abdominal ultrasound to evaluate the liver and for ascites--negative ascites, fatty liver, left hepatic cyst, no hydronephrosis -Hepatitis B surface antigen, hepatitis C antibody, ANA--negative -consult [facility]--Spoke with Dr. Amedeo Plenty Right knee pain/right knee effusion -Status post right knee TKA-01/02/2014 -X-ray reveals suprapatellar effusion--ESR 9, CRP 0.7 -Given the patient's recent bacteremia with [facility] in Nov. 2015, concerned about possible seeding infection -spoke with Dr. Ronnie Derby about possible arthrocentesis--send for cell count and culture -d/c abx for now to maximize cultures Alcohol abuse -Alcohol withdrawal protocol -Last drink was possibly 7 days ago Depression/substance induced mood disorder -appreciate psychiatry--> increased Celexa to 20 mg daily, added trazodone at bedtime -Patient states that Celexa is not working well for her -Daughter request a psychiatry evaluation which I have called     Family Communication:   Daughter updated at beside--total time 35 min, >50% spent counseling patient and coordinating care Disposition Plan:   SNF 02/24/14 if stable        Procedures/Studies: Dg Chest 2 View  02/21/2014   CLINICAL DATA:  Shortness of breath.  EXAM: CHEST  2 VIEW  COMPARISON:  January 17, 2014.  FINDINGS: The heart size and mediastinal contours are within normal limits. Both lungs are clear. No pneumothorax or pleural effusion is noted. The visualized skeletal structures are unremarkable.  IMPRESSION: No acute  cardiopulmonary abnormality seen.   Electronically Signed   By: Sabino Dick M.D.   On: 02/21/2014 15:13   Ct Head Wo Contrast  02/21/2014   CLINICAL DATA:  Increasing confusion. Altered mental status. History of carotid cavernous sinus fistula.  EXAM: CT HEAD WITHOUT CONTRAST  TECHNIQUE: Contiguous axial images were obtained from the base of the skull through the vertex without intravenous contrast.  COMPARISON:  None.  FINDINGS: There is extensive high density material in the region of the left cavernous sinus consistent with including from previous treatment for carotid cavernous sinus fistula. There is a small collection of the dense material in the left temporal fossa laterally. This material obscures some detail.  There is a tiny old lacunar infarct in the right basal ganglia. Brain parenchyma otherwise appears normal other than mild atrophy with secondary slight ventricular dilatation. There is no acute intracranial hemorrhage, infarction, or mass lesion. Osseous structures are normal.  IMPRESSION: No acute abnormality. Previous treatment for left carotid cavernous sinus fistula. Tiny old lacunar infarct in the right basal ganglia.   Electronically Signed   By: Rozetta Nunnery M.D.   On: 02/21/2014 15:45   Ct Angio Chest Pe W/cm &/or Wo Cm  02/21/2014   CLINICAL DATA:  Short of breath  EXAM: CT ANGIOGRAPHY CHEST WITH CONTRAST  TECHNIQUE: Multidetector CT imaging of the chest was performed using the standard protocol during bolus administration of intravenous contrast. Multiplanar CT image reconstructions and MIPs were obtained to evaluate the vascular anatomy.  CONTRAST:  169m OMNIPAQUE IOHEXOL 350 MG/ML SOLN  COMPARISON:  CT chest 04/23/2009  FINDINGS: The patient could not hold her breath and the image quality is degraded by significant motion.  Negative for pulmonary emboli. Small emboli could be missed on the study due to  motion. Negative for aortic dissection. Mild atherosclerotic disease in the  aorta. Coronary artery calcification. Mild cardiac enlargement without pericardial effusion.  Calcified granuloma right upper lobe unchanged from the prior CT. Negative for infiltrate or effusion. Negative for mass or adenopathy.  No acute abnormality in the upper abdomen. No acute thoracic spine abnormality.  Review of the MIP images confirms the above findings.  IMPRESSION: Image quality degraded by motion from breathing  Negative for pulmonary embolism.  No acute abnormality.   Electronically Signed   By: Franchot Gallo M.D.   On: 02/21/2014 15:50   US Abdomen Complete  02/23/2014   CLINICAL DATA:  Hepatic encephalopathy  EXAM: ULTRASOUND ABDOMEN COMPLETE  COMPARISON:  Ultrasound 07/03/2010  FINDINGS: Gallbladder: No gallstones or wall thickening visualized. No sonographic Murphy sign noted.  Common bile duct: Diameter: 9 mm in diameter mild prominent size  Liver: Diffuse increased echogenicity of the liver suspicious for fatty infiltration. There is a cyst in left hepatic lobe measures 1.3 x 1.2 cm.  IVC: No abnormality visualized.  Pancreas: Visualized portion unremarkable.  Spleen: Size and appearance within normal limits.  6.4 cm in length.  Right Kidney: Length: 10.5 cm. Echogenicity within normal limits. No mass or hydronephrosis visualized.  Left Kidney: Length: 12 cm. Echogenicity within normal limits. No mass or hydronephrosis visualized.  Abdominal aorta: No aneurysm visualized. Measures up to 1.7 cm in diameter. No abdominal ascites.  Other findings: None.  IMPRESSION: 1. No gallstones are noted within gallbladder. 2. CBD measures 9 mm in diameter mild prominent in size. No intrahepatic biliary ductal dilatation. 3. Mild increased echogenicity of the liver suspicious for fatty infiltration. There is a cyst in left hepatic lobe measures 1.3 cm. 4. No hydronephrosis.  No renal calculi. 5. No aortic aneurysm.   Electronically Signed   By: Lahoma Crocker M.D.   On: 02/23/2014 10:40   Dg Knee Complete 4  Views Right  02/21/2014   CLINICAL DATA:  Status post fall after total knee replacement on 11/19, redness/swelling  EXAM: RIGHT KNEE - COMPLETE 4+ VIEW  COMPARISON:  01/03/2014  FINDINGS: Right total knee arthroplasty in satisfactory position. No evidence of hardware complication or loosening.  No fracture or dislocation is seen.  Moderate suprapatellar knee joint effusion.  IMPRESSION: Right total knee arthroplasty in satisfactory position. No evidence of complication.  Moderate suprapatellar knee joint effusion.   Electronically Signed   By: Julian Hy M.D.   On: 02/21/2014 20:00         Subjective: Patient denies fevers, chills, headache, chest pain, dyspnea, nausea, vomiting, diarrhea, abdominal pain, dysuria, hematuria. Continues to complain of right knee pain   Objective: Filed Vitals:   02/22/14 1348 02/22/14 2138 02/23/14 0526 02/23/14 1251  BP: 145/82 178/94 156/89 158/89  Pulse: 95 87 83 97  Temp: 97.5 F (36.4 C) 98.3 F (36.8 C) 98.1 F (36.7 C) 97.3 F (36.3 C)  TempSrc: Oral Oral Oral Axillary  Resp: '18 18 18 20  ' Height:      Weight:      SpO2: 97% 97% 98% 99%    Intake/Output Summary (Last 24 hours) at 02/23/14 1916 Last data filed at 02/23/14 1251  Gross per 24 hour  Intake   1500 ml  Output   3450 ml  Net  -1950 ml   Weight change:  Exam:   General:  Pt is alert, follows commands appropriately, not in acute distress  HEENT: No icterus, No thrush, [place]/AT  Cardiovascular: RRR, S1/S2, no  rubs, no gallops  Respiratory: CTA bilaterally, no wheezing, no crackles, no rhonchi  Abdomen: Soft/+BS, non tender, non distended, no guarding  Extremities: 1+LE edema, No lymphangitis, No petechiae, No rashes, no synovitis; right knee effusion without any external erythema or crepitance  Data Reviewed: Basic Metabolic Panel:  Recent Labs Lab 02/21/14 1359 02/21/14 1835 02/22/14 0620 02/23/14 0418  NA 143  --  137 144  K 3.3*  --  2.9* 3.2*  CL 98   --  101 107  CO2 22  --  25 27  GLUCOSE 137*  --  112* 109*  BUN 7  --  5* 5*  CREATININE 0.72 0.74 0.75 0.80  CALCIUM 10.1  --  8.2* 8.4  MG  --  1.7  --   --    Liver Function Tests:  Recent Labs Lab 02/21/14 1359 02/22/14 0620  AST 34 21  ALT 17 10  ALKPHOS 142* 103  BILITOT 0.6 0.5  PROT 7.9 5.8*  ALBUMIN 3.6 2.7*   No results for input(s): LIPASE, AMYLASE in the last 168 hours.  Recent Labs Lab 02/21/14 1359 02/22/14 0620 02/22/14 1642  AMMONIA 74* 43 10*   CBC:  Recent Labs Lab 02/21/14 1359 02/21/14 1835 02/22/14 0620  WBC 7.1 7.5 5.6  HGB 16.7* 15.3* 13.2  HCT 46.8* 43.5 39.2  MCV 86.7 88.6 89.5  PLT 230 191 153   Cardiac Enzymes:  Recent Labs Lab 02/21/14 1835 02/22/14 0020 02/22/14 0620  TROPONINI <0.30 <0.30 <0.30   BNP: Invalid input(s): POCBNP CBG: No results for input(s): GLUCAP in the last 168 hours.  Recent Results (from the past 240 hour(s))  Blood culture (routine x 2)     Status: None (Preliminary result)   Collection Time: 02/21/14  5:03 PM  Result Value Ref Range Status   Specimen Description BLOOD LEFT ANTECUBITAL  Final   Special Requests BOTTLES DRAWN AEROBIC AND ANAEROBIC 5CC  Final   Culture  Setup Time   Final    02/21/2014 22:41 Performed at [facility]    Culture   Final           BLOOD CULTURE RECEIVED NO GROWTH TO DATE CULTURE WILL BE HELD FOR 5 DAYS BEFORE ISSUING A FINAL NEGATIVE REPORT Performed at [facility]    Report Status PENDING  Incomplete  Blood culture (routine x 2)     Status: None (Preliminary result)   Collection Time: 02/21/14  5:05 PM  Result Value Ref Range Status   Specimen Description BLOOD LEFT ANTECUBITAL  Final   Special Requests BOTTLES DRAWN AEROBIC AND ANAEROBIC 5CC  Final   Culture  Setup Time   Final    02/21/2014 22:41 Performed at [facility]    Culture   Final           BLOOD CULTURE RECEIVED NO GROWTH TO DATE CULTURE WILL BE HELD FOR 5 DAYS BEFORE  ISSUING A FINAL NEGATIVE REPORT Performed at [facility]    Report Status PENDING  Incomplete  Urine culture     Status: None (Preliminary result)   Collection Time: 02/21/14  5:23 PM  Result Value Ref Range Status   Specimen Description URINE, CATHETERIZED  Final   Special Requests NONE  Final   Culture  Setup Time   Final    02/21/2014 22:52 Performed at [place]   Final    >=100,000 COLONIES/ML Performed at [facility]  Culture   Final    ESCHERICHIA COLI Performed at [facility]    Report Status PENDING  Incomplete  Clostridium Difficile by PCR     Status: None   Collection Time: 02/21/14  9:00 PM  Result Value Ref Range Status   C difficile by pcr NEGATIVE NEGATIVE Final    Comment: Performed at [facility]     Scheduled Meds: . citalopram  20 mg Oral QHS  . enoxaparin (LOVENOX) injection  40 mg Subcutaneous Q24H  . folic acid  1 mg Oral Daily  . gabapentin  300 mg Oral QHS  . hydrOXYzine  50 mg Oral QHS  . lactulose  10 g Oral TID  . LORazepam  0-4 mg Oral Q12H  . multivitamin with minerals  1 tablet Oral Daily  . potassium chloride SA  20 mEq Oral BID  . sodium chloride  3 mL Intravenous Q12H  . thiamine  100 mg Oral Daily   Or  . thiamine  100 mg Intravenous Daily  . traZODone  50 mg Oral QHS   Continuous Infusions: . sodium chloride 100 mL/hr at 02/23/14 0050     Kael Keetch, DO  [facility] Pager [phone]  If 7PM-7AM, please contact night-coverage [email or web address] Password [email or web address] 02/23/2014, 7:16 PM   LOS: 2 days

## 2014-02-23 NOTE — Consult Note (Signed)
Denise [facility] Face-to-Face Psychiatry Consult   Reason for Consult:  Depression and alcohol abuse Referring Physician:  Dr. Doran Clay Denise Kelly is an 73 y.o. female. Total Time spent with patient: 45 minutes  Assessment: AXIS I:  Alcohol Abuse, Major Depression, Recurrent severe and Substance Induced Mood Disorder AXIS II:  Deferred AXIS III:   Past Medical History  Diagnosis Date  . Complication of anesthesia     "felt drunk for a week after"  . Hypertension     not being treated with medication  . Palpitations   . Anxiety   . GERD (gastroesophageal reflux disease)   . Arthritis   . Cancer     skin  . Alcohol abuse     ETOH and xanax   AXIS IV:  other psychosocial or environmental problems, problems related to social environment and problems with primary support group AXIS V:  51-60 moderate symptoms  Plan:  No evidence of imminent risk to self or others at present.   Patient does not meet criteria for psychiatric inpatient admission. Supportive therapy provided about ongoing stressors.  Subjective:   Denise Kelly is a 73 y.o. female patient admitted with altered mental status.  HPI: Patient is seen and chart reviewed and case discussed with the patient and Sindy Messing LCSW. Patient reported she has been depressed since her husband died 70 years ago in cardiothoracic surgery. Patient reportedly drinking alcohol since then. Patient still suffered with multiple medical problems. Patient reported she continued to be depressed with his severe arterial 5 out of 10, isolation, withdrawn, decreased appetite and sleep. Patient denies suicidal, homicidal ideation, intention or plans. Patient has no evidence of psychotic symptoms. Patient was admitted to the [place] floor with altered mental status probably secondary to quitting alcohol and changing her medication. Patient reported she does not want to be good dose of anti-depressant medication because she is concerned about possible  agitation. Patient is willing to give a trial of Cymbalta if citalopram does not work.  Medical history: 73 year old morbidly obese female , status post knee arthroplasty , recently admitted 11/4 for altered mental status, right knee pain, urinary tract infection , presents to the ER with a similar presentation today. Apparently Dr. Ronnie Derby had examined the patient knee in November during this admission, and he did not suspect any infection. Patient was found to have Escherichia coli bacteremia and Escherichia coli in her urine, treated with Rocephin in the [facility] and subsequently amoxicillin. Patient developed chronic diarrhea for approximately a month. Now states that the diarrhea has resolved. Patient states that she has not had any diarrhea in 1 week. Patient has had persistent right leg pain had a venous Doppler done 11/12 that did not show any evidence of DVT or Baker's cyst. Patient presents today because of persistent right knee pain. The patient has not been ambulating, not getting out of bed, not eating or drinking. Apparently she has not showered since October. She has been missing her physical therapy appointment. She has become more restless and agitated. Daughter states that the patient had been drinking wine up until 5 days ago and suddenly stopped drinking. Patient denies any seizures, found to memory gaps but no significant confusion at the time of my interview. ER course- Ammonia level elevated. Does not want me to examine her right knee, states that it is too painful to touch. Patient found to have another urinary tract infection in the ER. CT of the head negative. CT angiogram chest negative  HPI Elements: Location:  Depression and substance abuse. Quality:  Disturbance of sleep and appetite and depressed mood. Severity:  Moderate. Timing:  Recently quit drinking alcohol about 10 days ago.  Past Psychiatric History: Past Medical History  Diagnosis Date  . Complication of  anesthesia     "felt drunk for a week after"  . Hypertension     not being treated with medication  . Palpitations   . Anxiety   . GERD (gastroesophageal reflux disease)   . Arthritis   . Cancer     skin  . Alcohol abuse     ETOH and xanax    reports that she has quit smoking. She has never used smokeless tobacco. She reports that she drinks alcohol. She reports that she does not use illicit drugs. History reviewed. No pertinent family history.   Living Arrangements: Alone   Abuse/Neglect [facility]) Physical Abuse: Denies Verbal Abuse: Denies Sexual Abuse: Denies Allergies:   Allergies  Allergen Reactions  . Sulfa Antibiotics Hives and Swelling  . Ciprofloxacin Itching  . Motrin [Ibuprofen] Palpitations    ACT Assessment Complete:  No Objective: Blood pressure 156/89, pulse 83, temperature 98.1 F (36.7 C), temperature source Oral, resp. rate 18, height _0  (1.727 m), weight 90.719 kg (200 lb), SpO2 98 %.Body mass index is 30.42 kg/(m^2). Results for orders placed or performed during the [facility] encounter of 02/21/14 (from the past 72 hour(s))  CBC     Status: Abnormal   Collection Time: 02/21/14  1:59 PM  Result Value Ref Range   WBC 7.1 4.0 - 10.5 K/uL   RBC 5.40 (H) 3.87 - 5.11 MIL/uL   Hemoglobin 16.7 (H) 12.0 - 15.0 g/dL   HCT 46.8 (H) 36.0 - 46.0 %   MCV 86.7 78.0 - 100.0 fL   MCH 30.9 26.0 - 34.0 pg   MCHC 35.7 30.0 - 36.0 g/dL   RDW 13.3 11.5 - 15.5 %   Platelets 230 150 - 400 K/uL  Comprehensive metabolic panel     Status: Abnormal   Collection Time: 02/21/14  1:59 PM  Result Value Ref Range   Sodium 143 137 - 147 mEq/L   Potassium 3.3 (L) 3.7 - 5.3 mEq/L   Chloride 98 96 - 112 mEq/L   CO2 22 19 - 32 mEq/L   Glucose, Bld 137 (H) 70 - 99 mg/dL   BUN 7 6 - 23 mg/dL   Creatinine, Ser 0.72 0.50 - 1.10 mg/dL   Calcium 10.1 8.4 - 10.5 mg/dL   Total Protein 7.9 6.0 - 8.3 g/dL   Albumin 3.6 3.5 - 5.2 g/dL   AST 34 0 - 37 U/L    Comment: SLIGHT  HEMOLYSIS HEMOLYSIS AT THIS LEVEL MAY AFFECT RESULT    ALT 17 0 - 35 U/L   Alkaline Phosphatase 142 (H) 39 - 117 U/L   Total Bilirubin 0.6 0.3 - 1.2 mg/dL   GFR calc non Af Amer 84 (L) >90 mL/min   GFR calc Af Amer >90 >90 mL/min    Comment: (NOTE) The eGFR has been calculated using the CKD EPI equation. This calculation has not been validated in all clinical situations. eGFR's persistently <90 mL/min signify possible Chronic Kidney Disease.    Anion gap 23 (H) 5 - 15  Ammonia     Status: Abnormal   Collection Time: 02/21/14  1:59 PM  Result Value Ref Range   Ammonia 74 (H) 11 - 60 umol/L  Ethanol     Status: None  Collection Time: 02/21/14  1:59 PM  Result Value Ref Range   Alcohol, Ethyl (B) <11 0 - 11 mg/dL    Comment:        LOWEST DETECTABLE LIMIT FOR SERUM ALCOHOL IS 11 mg/dL FOR MEDICAL PURPOSES ONLY   Acetaminophen level     Status: None   Collection Time: 02/21/14  1:59 PM  Result Value Ref Range   Acetaminophen (Tylenol), Serum <15.0 10 - 30 ug/mL    Comment:        THERAPEUTIC CONCENTRATIONS VARY SIGNIFICANTLY. A RANGE OF 10-30 ug/mL MAY BE AN EFFECTIVE CONCENTRATION FOR MANY PATIENTS. HOWEVER, SOME ARE BEST TREATED AT CONCENTRATIONS OUTSIDE THIS RANGE. ACETAMINOPHEN CONCENTRATIONS >150 ug/mL AT 4 HOURS AFTER INGESTION AND >50 ug/mL AT 12 HOURS AFTER INGESTION ARE OFTEN ASSOCIATED WITH TOXIC REACTIONS.   Salicylate level     Status: Abnormal   Collection Time: 02/21/14  1:59 PM  Result Value Ref Range   Salicylate Lvl <6.2 (L) 2.8 - 20.0 mg/dL  Urinalysis, Routine w reflex microscopic     Status: Abnormal   Collection Time: 02/21/14  3:57 PM  Result Value Ref Range   Color, Urine YELLOW YELLOW   APPearance CLOUDY (A) CLEAR   Specific Gravity, Urine 1.031 (H) 1.005 - 1.030   pH 7.5 5.0 - 8.0   Glucose, UA NEGATIVE NEGATIVE mg/dL   Hgb urine dipstick NEGATIVE NEGATIVE   Bilirubin Urine NEGATIVE NEGATIVE   Ketones, ur 15 (A) NEGATIVE mg/dL    Protein, ur NEGATIVE NEGATIVE mg/dL   Urobilinogen, UA 1.0 0.0 - 1.0 mg/dL   Nitrite POSITIVE (A) NEGATIVE   Leukocytes, UA TRACE (A) NEGATIVE  Urine rapid drug screen (hosp performed)     Status: Abnormal   Collection Time: 02/21/14  3:57 PM  Result Value Ref Range   Opiates POSITIVE (A) NONE DETECTED   Cocaine NONE DETECTED NONE DETECTED   Benzodiazepines NONE DETECTED NONE DETECTED   Amphetamines NONE DETECTED NONE DETECTED   Tetrahydrocannabinol NONE DETECTED NONE DETECTED   Barbiturates NONE DETECTED NONE DETECTED    Comment:        DRUG SCREEN FOR MEDICAL PURPOSES ONLY.  IF CONFIRMATION IS NEEDED FOR ANY PURPOSE, NOTIFY LAB WITHIN 5 DAYS.        LOWEST DETECTABLE LIMITS FOR URINE DRUG SCREEN Drug Class       Cutoff (ng/mL) Amphetamine      1000 Barbiturate      200 Benzodiazepine   563 Tricyclics       893 Opiates          300 Cocaine          300 THC              50   Urine microscopic-add on     Status: Abnormal   Collection Time: 02/21/14  3:57 PM  Result Value Ref Range   Squamous Epithelial / LPF RARE RARE   WBC, UA 3-6 <3 WBC/hpf   Bacteria, UA MANY (A) RARE   Urine-Other MUCOUS PRESENT   Blood culture (routine x 2)     Status: None (Preliminary result)   Collection Time: 02/21/14  5:03 PM  Result Value Ref Range   Specimen Description BLOOD LEFT ANTECUBITAL    Special Requests BOTTLES DRAWN AEROBIC AND ANAEROBIC 5CC    Culture  Setup Time      02/21/2014 22:41 Performed at [facility]  BLOOD CULTURE RECEIVED NO GROWTH TO DATE CULTURE WILL BE HELD FOR 5 DAYS BEFORE ISSUING A FINAL NEGATIVE REPORT Performed at [facility]    Report Status PENDING   Blood culture (routine x 2)     Status: None (Preliminary result)   Collection Time: 02/21/14  5:05 PM  Result Value Ref Range   Specimen Description BLOOD LEFT ANTECUBITAL    Special Requests BOTTLES DRAWN AEROBIC AND ANAEROBIC 5CC    Culture  Setup Time       02/21/2014 22:41 Performed at [facility]    Culture             BLOOD CULTURE RECEIVED NO GROWTH TO DATE CULTURE WILL BE HELD FOR 5 DAYS BEFORE ISSUING A FINAL NEGATIVE REPORT Performed at [facility]    Report Status PENDING   Urine culture     Status: None (Preliminary result)   Collection Time: 02/21/14  5:23 PM  Result Value Ref Range   Specimen Description URINE, CATHETERIZED    Special Requests NONE    Culture  Setup Time      02/21/2014 22:52 Performed at [place]      >=100,000 COLONIES/ML Performed at [place] Performed at [facility]    Report Status PENDING   Magnesium     Status: None   Collection Time: 02/21/14  6:35 PM  Result Value Ref Range   Magnesium 1.7 1.5 - 2.5 mg/dL  CBC     Status: Abnormal   Collection Time: 02/21/14  6:35 PM  Result Value Ref Range   WBC 7.5 4.0 - 10.5 K/uL   RBC 4.91 3.87 - 5.11 MIL/uL   Hemoglobin 15.3 (H) 12.0 - 15.0 g/dL   HCT 43.5 36.0 - 46.0 %   MCV 88.6 78.0 - 100.0 fL   MCH 31.2 26.0 - 34.0 pg   MCHC 35.2 30.0 - 36.0 g/dL   RDW 13.6 11.5 - 15.5 %   Platelets 191 150 - 400 K/uL  Creatinine, serum     Status: Abnormal   Collection Time: 02/21/14  6:35 PM  Result Value Ref Range   Creatinine, Ser 0.74 0.50 - 1.10 mg/dL   GFR calc non Af Amer 83 (L) >90 mL/min   GFR calc Af Amer >90 >90 mL/min    Comment: (NOTE) The eGFR has been calculated using the CKD EPI equation. This calculation has not been validated in all clinical situations. eGFR's persistently <90 mL/min signify possible Chronic Kidney Disease.   Troponin I     Status: None   Collection Time: 02/21/14  6:35 PM  Result Value Ref Range   Troponin I <0.30 <0.30 ng/mL    Comment:        Due to the release kinetics of cTnI, a negative result within the first hours of the onset of symptoms does not rule out myocardial infarction with certainty. If  myocardial infarction is still suspected, repeat the test at appropriate intervals.   Hemoglobin A1c     Status: None   Collection Time: 02/21/14  6:35 PM  Result Value Ref Range   Hgb A1c MFr Bld 5.4 <5.7 %    Comment: (NOTE)  According to the ADA Clinical Practice Recommendations for 2011, when HbA1c is used as a screening test:  >=6.5%   Diagnostic of Diabetes Mellitus           (if abnormal result is confirmed) 5.7-6.4%   Increased risk of developing Diabetes Mellitus References:Diagnosis and Classification of Diabetes Mellitus,Diabetes IRCV,8938,10(FBPZW 1):S62-S69 and Standards of Medical Care in         Diabetes - 2011,Diabetes CHEN,2778,24 (Suppl 1):S11-S61.    Mean Plasma Glucose 108 <117 mg/dL    Comment: Performed at [facility]  TSH     Status: None   Collection Time: 02/21/14  6:35 PM  Result Value Ref Range   TSH 1.320 0.350 - 4.500 uIU/mL    Comment: Performed at [place] b natriuretic peptide     Status: Abnormal   Collection Time: 02/21/14  6:35 PM  Result Value Ref Range   Pro B Natriuretic peptide (BNP) 166.9 (H) 0 - 125 pg/mL  Uric acid     Status: None   Collection Time: 02/21/14  6:35 PM  Result Value Ref Range   Uric Acid, Serum 4.6 2.4 - 7.0 mg/dL  Clostridium Difficile by PCR     Status: None   Collection Time: 02/21/14  9:00 PM  Result Value Ref Range   C difficile by pcr NEGATIVE NEGATIVE    Comment: Performed at [facility]  Troponin I     Status: None   Collection Time: 02/22/14 12:20 AM  Result Value Ref Range   Troponin I <0.30 <0.30 ng/mL    Comment:        Due to the release kinetics of cTnI, a negative result within the first hours of the onset of symptoms does not rule out myocardial infarction with certainty. If myocardial infarction is still suspected, repeat the test at appropriate intervals.   CBC     Status: None   Collection  Time: 02/22/14  6:20 AM  Result Value Ref Range   WBC 5.6 4.0 - 10.5 K/uL   RBC 4.38 3.87 - 5.11 MIL/uL   Hemoglobin 13.2 12.0 - 15.0 g/dL   HCT 39.2 36.0 - 46.0 %   MCV 89.5 78.0 - 100.0 fL   MCH 30.1 26.0 - 34.0 pg   MCHC 33.7 30.0 - 36.0 g/dL   RDW 13.7 11.5 - 15.5 %   Platelets 153 150 - 400 K/uL  Comprehensive metabolic panel     Status: Abnormal   Collection Time: 02/22/14  6:20 AM  Result Value Ref Range   Sodium 137 137 - 147 mEq/L   Potassium 2.9 (LL) 3.7 - 5.3 mEq/L    Comment: CRITICAL RESULT CALLED TO, READ BACK BY AND VERIFIED WITH: Aliene Altes RN AT 0725 ON 12.9.15 BY SHUEA    Chloride 101 96 - 112 mEq/L   CO2 25 19 - 32 mEq/L   Glucose, Bld 112 (H) 70 - 99 mg/dL   BUN 5 (L) 6 - 23 mg/dL   Creatinine, Ser 0.75 0.50 - 1.10 mg/dL   Calcium 8.2 (L) 8.4 - 10.5 mg/dL   Total Protein 5.8 (L) 6.0 - 8.3 g/dL   Albumin 2.7 (L) 3.5 - 5.2 g/dL   AST 21 0 - 37 U/L   ALT 10 0 - 35 U/L   Alkaline Phosphatase 103 39 - 117 U/L   Total Bilirubin 0.5 0.3 - 1.2 mg/dL   GFR calc non Af Amer 83 (L) >90 mL/min   GFR  calc Af Amer >90 >90 mL/min    Comment: (NOTE) The eGFR has been calculated using the CKD EPI equation. This calculation has not been validated in all clinical situations. eGFR's persistently <90 mL/min signify possible Chronic Kidney Disease.    Anion gap 11 5 - 15  Troponin I     Status: None   Collection Time: 02/22/14  6:20 AM  Result Value Ref Range   Troponin I <0.30 <0.30 ng/mL    Comment:        Due to the release kinetics of cTnI, a negative result within the first hours of the onset of symptoms does not rule out myocardial infarction with certainty. If myocardial infarction is still suspected, repeat the test at appropriate intervals.   Ammonia     Status: None   Collection Time: 02/22/14  6:20 AM  Result Value Ref Range   Ammonia 43 11 - 60 umol/L  Ammonia     Status: Abnormal   Collection Time: 02/22/14  4:42 PM  Result Value Ref Range    Ammonia 10 (L) 11 - 60 umol/L  Hepatitis B surface antigen     Status: None   Collection Time: 02/22/14  4:42 PM  Result Value Ref Range   Hepatitis B Surface Ag NEGATIVE NEGATIVE    Comment: Performed at [facility]  Hepatitis C antibody     Status: None   Collection Time: 02/22/14  4:42 PM  Result Value Ref Range   HCV Ab NEGATIVE NEGATIVE    Comment: Performed at [facility]  AFP tumor marker     Status: None   Collection Time: 02/22/14  4:42 PM  Result Value Ref Range   AFP-Tumor Marker 5.6 <6.1 ng/mL    Comment: (NOTE) Patients < 93 month old: * Pediatric range is based on full term neonates, values for premature infants may be higher. Female: The use of AFP as a Tumor Marker in pregnant patients is not recommended. This test was performed using the Beckman Coulter chemiluminescent method. Values obtained from different assay methods cannot be used interchangeably. AFP levels, regardless of value, should not be interpreted as absolute evidence of the presence or absence of disease. Performed at [facility]   C-reactive protein     Status: Abnormal   Collection Time: 02/22/14  6:42 PM  Result Value Ref Range   CRP 0.7 (H) <0.60 mg/dL    Comment: Performed at [facility]  Sedimentation rate     Status: None   Collection Time: 02/22/14  6:42 PM  Result Value Ref Range   Sed Rate 9 0 - 22 mm/hr  Basic metabolic panel     Status: Abnormal   Collection Time: 02/23/14  4:18 AM  Result Value Ref Range   Sodium 144 137 - 147 mEq/L    Comment: REPEATED TO VERIFY DELTA CHECK NOTED    Potassium 3.2 (L) 3.7 - 5.3 mEq/L   Chloride 107 96 - 112 mEq/L   CO2 27 19 - 32 mEq/L   Glucose, Bld 109 (H) 70 - 99 mg/dL   BUN 5 (L) 6 - 23 mg/dL   Creatinine, Ser 0.80 0.50 - 1.10 mg/dL   Calcium 8.4 8.4 - 10.5 mg/dL   GFR calc non Af Amer 72 (L) >90 mL/min   GFR calc Af Amer 83 (L) >90 mL/min    Comment: (NOTE) The eGFR has been calculated using the  CKD EPI equation. This calculation has not been validated in  all clinical situations. eGFR's persistently <90 mL/min signify possible Chronic Kidney Disease.    Anion gap 10 5 - 15   Labs are reviewed.  Current Facility-Administered Medications  Medication Dose Route Frequency Provider Last Rate Last Dose  . 0.9 %  sodium chloride infusion   Intravenous Continuous Reyne Dumas, MD 100 mL/hr at 02/23/14 0050    . acetaminophen (TYLENOL) tablet 650 mg  650 mg Oral Q6H PRN Reyne Dumas, MD   650 mg at 02/22/14 2122   Or  . acetaminophen (TYLENOL) suppository 650 mg  650 mg Rectal Q6H PRN Reyne Dumas, MD      . citalopram (CELEXA) tablet 10 mg  10 mg Oral QHS Reyne Dumas, MD   10 mg at 02/22/14 2122  . enoxaparin (LOVENOX) injection 40 mg  40 mg Subcutaneous Q24H Reyne Dumas, MD   40 mg at 02/22/14 1830  . folic acid (FOLVITE) tablet 1 mg  1 mg Oral Daily Threasa Beards, MD   1 mg at 02/22/14 1104  . gabapentin (NEURONTIN) capsule 300 mg  300 mg Oral QHS Reyne Dumas, MD   300 mg at 02/22/14 2122  . hydrOXYzine (ATARAX/VISTARIL) tablet 50 mg  50 mg Oral QHS Reyne Dumas, MD   50 mg at 02/22/14 2121  . lactulose (CHRONULAC) 10 GM/15ML solution 10 g  10 g Oral TID Reyne Dumas, MD   10 g at 02/22/14 1701  . levalbuterol (XOPENEX) nebulizer solution 0.63 mg  0.63 mg Nebulization Q6H PRN Reyne Dumas, MD      . LORazepam (ATIVAN) tablet 1 mg  1 mg Oral Q6H PRN Threasa Beards, MD       Or  . LORazepam (ATIVAN) injection 1 mg  1 mg Intravenous Q6H PRN Threasa Beards, MD      . LORazepam (ATIVAN) tablet 0-4 mg  0-4 mg Oral Q6H Threasa Beards, MD   1 mg at 02/23/14 0049   Followed by  . LORazepam (ATIVAN) tablet 0-4 mg  0-4 mg Oral Q12H Threasa Beards, MD      . multivitamin with minerals tablet 1 tablet  1 tablet Oral Daily Threasa Beards, MD   1 tablet at 02/22/14 1104  . ondansetron (ZOFRAN) tablet 4 mg  4 mg Oral Q6H PRN Reyne Dumas, MD   4 mg at 02/22/14 1830   Or  . ondansetron  (ZOFRAN) injection 4 mg  4 mg Intravenous Q6H PRN Reyne Dumas, MD   4 mg at 02/22/14 1117  . oxyCODONE (Oxy IR/ROXICODONE) immediate release tablet 5 mg  5 mg Oral Q6H PRN Reyne Dumas, MD   5 mg at 02/23/14 0048  . potassium chloride SA (K-DUR,KLOR-CON) CR tablet 20 mEq  20 mEq Oral BID Reyne Dumas, MD   20 mEq at 02/22/14 2121  . sodium chloride 0.9 % injection 3 mL  3 mL Intravenous Q12H Reyne Dumas, MD   0 mL at 02/21/14 2143  . thiamine (VITAMIN B-1) tablet 100 mg  100 mg Oral Daily Threasa Beards, MD   100 mg at 02/22/14 1103   Or  . thiamine (B-1) injection 100 mg  100 mg Intravenous Daily Threasa Beards, MD        Psychiatric Specialty Exam: Physical Exam as per history and physical   ROS generalized weakness, tired, isolated, poor distance of sleep and appetite and fine tremors   Blood pressure 156/89, pulse 83, temperature 98.1 F (36.7 C), temperature source Oral, resp. rate 18,  height _0  (1.727 m), weight 90.719 kg (200 lb), SpO2 98 %.Body mass index is 30.42 kg/(m^2).  General Appearance: Casual  Eye Contact::  Fair  Speech:  Clear and Coherent and Slow  Volume:  Normal  Mood:  Anxious and Depressed  Affect:  Constricted and Depressed  Thought Process:  Coherent and Goal Directed  Orientation:  Full (Time, Place, and Person)  Thought Content:  WDL  Suicidal Thoughts:  No  Homicidal Thoughts:  No  Memory:  Immediate;   Fair Recent;   Fair  Judgement:  Fair  Insight:  Fair  Psychomotor Activity:  Decreased  Concentration:  Good  Recall:  Good  Fund of Knowledge:Good  Language: Good  Akathisia:  NA  Handed:  Right  AIMS (if indicated):     Assets:  Communication Skills Desire for Improvement Financial Resources/Insurance Housing Intimacy Leisure Time Resilience Social Support Talents/Skills Transportation  Sleep:      Musculoskeletal: Strength & Muscle Tone: decreased Gait & Station: unable to stand Patient leans: N/A  Treatment Plan  Summary: Daily contact with patient to assess and evaluate symptoms and progress in treatment Medication management Increase citalopram 20 mg daily for depression anxiety Start trazodone 50 mg at bedtime for insomnia  Jillianne Gamino,JANARDHAHA R. 02/23/2014 9:25 AM

## 2014-02-23 NOTE — Progress Notes (Signed)
CSW reviewed PT evaluation recommending SNF at discharge. CSW provided SNF bed offers - patient states that she's been to [facility] before and plans to go there when released. Anticipating discharge tomorrow. CSW left message for Heather at [facility].   Clinical Social Work Department CLINICAL SOCIAL WORK PLACEMENT NOTE 02/23/2014  Patient:  LOA, IDLER  Account Number:  [id number] Admit date:  02/21/2014  Clinical Social Worker:  Renold Genta  Date/time:  02/23/2014 04:32 PM  Clinical Social Work is seeking post-discharge placement for this patient at the following level of care:   SKILLED NURSING   (*CSW will update this form in Epic as items are completed)   02/23/2014  Patient/family provided with [facility] Department of Clinical Social Work's list of facilities offering this level of care within the geographic area requested by the patient (or if unable, by the patient's family).  02/23/2014  Patient/family informed of their freedom to choose among providers that offer the needed level of care, that participate in Medicare, Medicaid or managed care program needed by the patient, have an available bed and are willing to accept the patient.  02/23/2014  Patient/family informed of [facility]' ownership interest in [facility], as well as of the fact that they are under no obligation to receive care at this facility.  PASARR submitted to EDS on 02/23/2014 PASARR number received on 02/23/2014  FL2 transmitted to all facilities in geographic area requested by pt/family on  02/23/2014 FL2 transmitted to all facilities within larger geographic area on   Patient informed that his/her managed care company has contracts with or will negotiate with  certain facilities, including the following:     Patient/family informed of bed offers received:  02/23/2014 Patient chooses bed at [facility] [place] Physician recommends and patient chooses bed at     Patient to be transferred to [place] on   Patient to be transferred to facility by  Patient and family notified of transfer on  Name of family member notified:    The following physician request were entered in Epic:   Additional Comments:   Raynaldo Opitz, [place] Social Worker cell #: [phone]

## 2014-02-23 NOTE — Progress Notes (Signed)
Clinical Social Work Department CLINICAL SOCIAL WORK PSYCHIATRY SERVICE LINE ASSESSMENT 02/23/2014  Patient:  Denise Kelly  Account:  [id number]  [place] Date:  02/21/2014  Clinical Social Worker:  Sindy Messing, LCSW  Date/Time:  02/23/2014 03:30 PM Referred by:  Physician  Date referred:  02/23/2014 Reason for Referral  Psychosocial assessment   Presenting Symptoms/Problems (In the person's/family's own words):   Psych consulted for depression and substance abuse.   Abuse/Neglect/Trauma History (check all that apply)  Denies history   Abuse/Neglect/Trauma Comments:   Psychiatric History (check all that apply)  Outpatient treatment   Psychiatric medications:  Celexa 20 mg   Current Mental Health Hospitalizations/Previous Mental Health History:   Patient reports she was diagnosed with depression about 20 years ago after her husband passed away. Patient currently receiving medication management on outpatient basis.   Current provider:   [facility]   Place and Date:   [place], [place]   Current Medications:   Scheduled Meds:      . citalopram  20 mg Oral QHS  . enoxaparin (LOVENOX) injection  40 mg Subcutaneous Q24H  . folic acid  1 mg Oral Daily  . gabapentin  300 mg Oral QHS  . hydrOXYzine  50 mg Oral QHS  . lactulose  10 g Oral TID  . LORazepam  0-4 mg Oral Q12H  . multivitamin with minerals  1 tablet Oral Daily  . potassium chloride SA  20 mEq Oral BID  . sodium chloride  3 mL Intravenous Q12H  . thiamine  100 mg Oral Daily   Or     . thiamine  100 mg Intravenous Daily  . traZODone  50 mg Oral QHS        Continuous Infusions:      . sodium chloride 100 mL/hr at 02/23/14 0050          PRN Meds:.acetaminophen **OR** acetaminophen, levalbuterol, LORazepam **OR** LORazepam, ondansetron **OR** ondansetron (ZOFRAN) IV, oxyCODONE       Previous Impatient Admission/Date/Reason:   None reported   Emotional Health / Current Symptoms    Suicide/Self  Harm  None reported   Suicide attempt in the past:   Patient denies any SI or HI. Patient denies any previous attempts.   Other harmful behavior:   None reported   Psychotic/Dissociative Symptoms  None reported   Other Psychotic/Dissociative Symptoms:    Attention/Behavioral Symptoms  Within Normal Limits   Other Attention / Behavioral Symptoms:   Patient engaged during assessment.    Cognitive Impairment  Within Normal Limits   Other Cognitive Impairment:   Patient alert and oriented.    Mood and Adjustment  Flat    Stress, Anxiety, Trauma, Any Recent Loss/Stressor  None reported   Anxiety (frequency):   N/A   Phobia (specify):   N/A   Compulsive behavior (specify):   N/A   Obsessive behavior (specify):   N/A   Other:   N/A   Substance Abuse/Use  Current substance use   SBIRT completed (please refer for detailed history):  Y  Self-reported substance use:   Patient denies any alcohol use in the past 10 days. 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 Patient reports she knew she was having medical problems so she stopped drinking on her own about 10 days ago.   Urinary Drug Screen Completed:  Y Alcohol level:   <11    Environmental/Housing/Living Arrangement  Stable housing   Who is in the home:   Alone   Emergency contact:  [place]-dtr   Financial  Medicare   Patient's Strengths and Goals (patient's own words):   Patient reports supportive family.   Clinical Social Worker's Interpretive Summary:   CSW received referral to complete psychosocial assessment. CSW reviewed chart and met with patient at bedside with psych MD.    Patient reports that she lives in an apartment by herself but dtr lives in the same neighborhood. Patient reports she moved to [facility] in 2009. 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 Patient reports  she came to the hospital for medical concerns but dtr requested psych consult.    Patient reports she was diagnosed with depression about 20 years ago. Patient's husband was scheduled for heart surgery and patient reports that doctors killed her husband by making a mistake during surgery. Patient states she had been sober for about 25 years but after husband died, she started drinking heavily again. Patient was diagnosed with depression during this time as well.    Patient currently receiving treatment at [facility] and stopped drinking alcohol about 10 days ago. Patient reports that she is unsure if her medication is effective and spoke with psych MD about medication recommendations. Patient reports she plans to continue to follow up with outpatient psychiatrist at [facility]. Patient denies any SI or HI or any psychotic symptoms.    CSW will continue to follow to provide support during hospitalization.   Disposition:  Recommend Psych CSW continuing to support while in hospital   [place],  [phone]

## 2014-02-24 DIAGNOSIS — F10230 Alcohol dependence with withdrawal, uncomplicated: Secondary | ICD-10-CM

## 2014-02-24 LAB — CBC
HEMATOCRIT: 39 % (ref 36.0–46.0)
Hemoglobin: 12.9 g/dL (ref 12.0–15.0)
MCH: 29.8 pg (ref 26.0–34.0)
MCHC: 33.1 g/dL (ref 30.0–36.0)
MCV: 90.1 fL (ref 78.0–100.0)
Platelets: 164 10*3/uL (ref 150–400)
RBC: 4.33 MIL/uL (ref 3.87–5.11)
RDW: 13.6 % (ref 11.5–15.5)
WBC: 5.9 10*3/uL (ref 4.0–10.5)

## 2014-02-24 LAB — BASIC METABOLIC PANEL
Anion gap: 10 (ref 5–15)
BUN: 5 mg/dL — AB (ref 6–23)
CHLORIDE: 104 meq/L (ref 96–112)
CO2: 26 meq/L (ref 19–32)
CREATININE: 0.81 mg/dL (ref 0.50–1.10)
Calcium: 8.5 mg/dL (ref 8.4–10.5)
GFR calc Af Amer: 82 mL/min — ABNORMAL LOW (ref >90)
GFR calc non Af Amer: 71 mL/min — ABNORMAL LOW (ref >90)
Glucose, Bld: 107 mg/dL — ABNORMAL HIGH (ref 70–99)
Potassium: 3.1 mEq/L — ABNORMAL LOW (ref 3.7–5.3)
Sodium: 140 mEq/L (ref 137–147)

## 2014-02-24 LAB — URINE CULTURE: Colony Count: >=100000

## 2014-02-24 MED ORDER — TRAZODONE HCL 50 MG PO TABS: 50.0000 mg | ORAL_TABLET | Freq: Every day | ORAL | Status: DC

## 2014-02-24 MED ORDER — CITALOPRAM HYDROBROMIDE 20 MG PO TABS: 20.0000 mg | ORAL_TABLET | Freq: Every day | ORAL | Status: DC

## 2014-02-24 MED ORDER — OXYCODONE HCL 5 MG PO TABS: 5.0000 mg | ORAL_TABLET | Freq: Four times a day (QID) | ORAL | Status: DC | PRN

## 2014-02-24 NOTE — Consult Note (Signed)
[facility] Face-to-Face Psychiatry Consult   Reason for Consult:  Depression and alcohol abuse Referring Physician:  Dr. Doran Clay Denise Kelly is an 73 y.o. female. Total Time spent with patient: 45 minutes  Assessment: AXIS I:  Alcohol Abuse, Major Depression, Recurrent severe and Substance Induced Mood Disorder AXIS II:  Deferred AXIS III:   Past Medical History  Diagnosis Date  . Complication of anesthesia     "felt drunk for a week after"  . Hypertension     not being treated with medication  . Palpitations   . Anxiety   . GERD (gastroesophageal reflux disease)   . Arthritis   . Cancer     skin  . Alcohol abuse     ETOH and xanax   AXIS IV:  other psychosocial or environmental problems, problems related to social environment and problems with primary support group AXIS V:  51-60 moderate symptoms  Plan: No evidence of imminent risk to self or others at present.   Patient does not meet criteria for psychiatric inpatient admission. Supportive therapy provided about ongoing stressors.  Appreciate psychiatric consultation and follow up as clinically required Please contact [phone] or [phone] if needs further assistance  Subjective:   Denise Kelly is a 73 y.o. female patient admitted with altered mental status.  HPI: Patient is seen and chart reviewed and case discussed with the patient and Sindy Messing LCSW. Patient reported she has been depressed since her husband died 73 years ago in cardiothoracic surgery. Patient reportedly drinking alcohol since then. Patient still suffered with multiple medical problems. Patient reported she continued to be depressed with his severe arterial 5 out of 10, isolation, withdrawn, decreased appetite and sleep. Patient denies suicidal, homicidal ideation, intention or plans. Patient has no evidence of psychotic symptoms. Patient was admitted to the [place] floor with altered mental status probably secondary to quitting alcohol and  changing her medication. Patient reported she does not want to be good dose of anti-depressant medication because she is concerned about possible agitation. Patient is willing to give a trial of Cymbalta if citalopram does not work.  Interval history: Patient seen today for psychiatric consultation follow-up. Patient stated she was taken her medication as prescribed and has no side effects. Patient feels somewhat better and hopefully she is going to get better with her depression and anxiety. Patient stated she is getting ready to participate in physical therapy. Patient has safety concerns.  Past Psychiatric History: Past Medical History  Diagnosis Date  . Complication of anesthesia     "felt drunk for a week after"  . Hypertension     not being treated with medication  . Palpitations   . Anxiety   . GERD (gastroesophageal reflux disease)   . Arthritis   . Cancer     skin  . Alcohol abuse     ETOH and xanax    reports that she has quit smoking. She has never used smokeless tobacco. She reports that she drinks alcohol. She reports that she does not use illicit drugs. History reviewed. No pertinent family history.   Living Arrangements: Alone   Abuse/Neglect [facility]) Physical Abuse: Denies Verbal Abuse: Denies Sexual Abuse: Denies Allergies:   Allergies  Allergen Reactions  . Sulfa Antibiotics Hives and Swelling  . Ciprofloxacin Itching  . Motrin [Ibuprofen] Palpitations    Objective: Blood pressure 159/76, pulse 88, temperature 97.5 F (36.4 C), temperature source Oral, resp. rate 18, height '5\' 8"'  (1.727 m), weight 90.719  kg (200 lb), SpO2 93 %.Body mass index is 30.42 kg/(m^2). Results for orders placed or performed during the hospital encounter of 02/21/14 (from the past 72 hour(s))  CBC     Status: Abnormal   Collection Time: 02/21/14  1:59 PM  Result Value Ref Range   WBC 7.1 4.0 - 10.5 K/uL   RBC 5.40 (H) 3.87 - 5.11 MIL/uL   Hemoglobin 16.7 (H) 12.0 - 15.0 g/dL   HCT  46.8 (H) 36.0 - 46.0 %   MCV 86.7 78.0 - 100.0 fL   MCH 30.9 26.0 - 34.0 pg   MCHC 35.7 30.0 - 36.0 g/dL   RDW 13.3 11.5 - 15.5 %   Platelets 230 150 - 400 K/uL  Comprehensive metabolic panel     Status: Abnormal   Collection Time: 02/21/14  1:59 PM  Result Value Ref Range   Sodium 143 137 - 147 mEq/L   Potassium 3.3 (L) 3.7 - 5.3 mEq/L   Chloride 98 96 - 112 mEq/L   CO2 22 19 - 32 mEq/L   Glucose, Bld 137 (H) 70 - 99 mg/dL   BUN 7 6 - 23 mg/dL   Creatinine, Ser 0.72 0.50 - 1.10 mg/dL   Calcium 10.1 8.4 - 10.5 mg/dL   Total Protein 7.9 6.0 - 8.3 g/dL   Albumin 3.6 3.5 - 5.2 g/dL   AST 34 0 - 37 U/L    Comment: SLIGHT HEMOLYSIS HEMOLYSIS AT THIS LEVEL MAY AFFECT RESULT    ALT 17 0 - 35 U/L   Alkaline Phosphatase 142 (H) 39 - 117 U/L   Total Bilirubin 0.6 0.3 - 1.2 mg/dL   GFR calc non Af Amer 84 (L) >90 mL/min   GFR calc Af Amer >90 >90 mL/min    Comment: (NOTE) The eGFR has been calculated using the CKD EPI equation. This calculation has not been validated in all clinical situations. eGFR's persistently <90 mL/min signify possible Chronic Kidney Disease.    Anion gap 23 (H) 5 - 15  Ammonia     Status: Abnormal   Collection Time: 02/21/14  1:59 PM  Result Value Ref Range   Ammonia 74 (H) 11 - 60 umol/L  Ethanol     Status: None   Collection Time: 02/21/14  1:59 PM  Result Value Ref Range   Alcohol, Ethyl (B) <11 0 - 11 mg/dL    Comment:        LOWEST DETECTABLE LIMIT FOR SERUM ALCOHOL IS 11 mg/dL FOR MEDICAL PURPOSES ONLY   Acetaminophen level     Status: None   Collection Time: 02/21/14  1:59 PM  Result Value Ref Range   Acetaminophen (Tylenol), Serum <15.0 10 - 30 ug/mL    Comment:        THERAPEUTIC CONCENTRATIONS VARY SIGNIFICANTLY. A RANGE OF 10-30 ug/mL MAY BE AN EFFECTIVE CONCENTRATION FOR MANY PATIENTS. HOWEVER, SOME ARE BEST TREATED AT CONCENTRATIONS OUTSIDE THIS RANGE. ACETAMINOPHEN CONCENTRATIONS >150 ug/mL AT 4 HOURS AFTER INGESTION AND >50  ug/mL AT 12 HOURS AFTER INGESTION ARE OFTEN ASSOCIATED WITH TOXIC REACTIONS.   Salicylate level     Status: Abnormal   Collection Time: 02/21/14  1:59 PM  Result Value Ref Range   Salicylate Lvl <0.3 (L) 2.8 - 20.0 mg/dL  Urinalysis, Routine w reflex microscopic     Status: Abnormal   Collection Time: 02/21/14  3:57 PM  Result Value Ref Range   Color, Urine YELLOW YELLOW   APPearance CLOUDY (A) CLEAR   Specific Gravity,  Urine 1.031 (H) 1.005 - 1.030   pH 7.5 5.0 - 8.0   Glucose, UA NEGATIVE NEGATIVE mg/dL   Hgb urine dipstick NEGATIVE NEGATIVE   Bilirubin Urine NEGATIVE NEGATIVE   Ketones, ur 15 (A) NEGATIVE mg/dL   Protein, ur NEGATIVE NEGATIVE mg/dL   Urobilinogen, UA 1.0 0.0 - 1.0 mg/dL   Nitrite POSITIVE (A) NEGATIVE   Leukocytes, UA TRACE (A) NEGATIVE  Urine rapid drug screen (hosp performed)     Status: Abnormal   Collection Time: 02/21/14  3:57 PM  Result Value Ref Range   Opiates POSITIVE (A) NONE DETECTED   Cocaine NONE DETECTED NONE DETECTED   Benzodiazepines NONE DETECTED NONE DETECTED   Amphetamines NONE DETECTED NONE DETECTED   Tetrahydrocannabinol NONE DETECTED NONE DETECTED   Barbiturates NONE DETECTED NONE DETECTED    Comment:        DRUG SCREEN FOR MEDICAL PURPOSES ONLY.  IF CONFIRMATION IS NEEDED FOR ANY PURPOSE, NOTIFY LAB WITHIN 5 DAYS.        LOWEST DETECTABLE LIMITS FOR URINE DRUG SCREEN Drug Class       Cutoff (ng/mL) Amphetamine      1000 Barbiturate      200 Benzodiazepine   103 Tricyclics       159 Opiates          300 Cocaine          300 THC              50   Urine microscopic-add on     Status: Abnormal   Collection Time: 02/21/14  3:57 PM  Result Value Ref Range   Squamous Epithelial / LPF RARE RARE   WBC, UA 3-6 <3 WBC/hpf   Bacteria, UA MANY (A) RARE   Urine-Other MUCOUS PRESENT   Blood culture (routine x 2)     Status: None (Preliminary result)   Collection Time: 02/21/14  5:03 PM  Result Value Ref Range   Specimen  Description BLOOD LEFT ANTECUBITAL    Special Requests BOTTLES DRAWN AEROBIC AND ANAEROBIC 5CC    Culture  Setup Time      02/21/2014 22:41 Performed at [facility]    Culture             BLOOD CULTURE RECEIVED NO GROWTH TO DATE CULTURE WILL BE HELD FOR 5 DAYS BEFORE ISSUING A FINAL NEGATIVE REPORT Performed at [facility]    Report Status PENDING   Blood culture (routine x 2)     Status: None (Preliminary result)   Collection Time: 02/21/14  5:05 PM  Result Value Ref Range   Specimen Description BLOOD LEFT ANTECUBITAL    Special Requests BOTTLES DRAWN AEROBIC AND ANAEROBIC 5CC    Culture  Setup Time      02/21/2014 22:41 Performed at [facility] NO GROWTH TO DATE CULTURE WILL BE HELD FOR 5 DAYS BEFORE ISSUING A FINAL NEGATIVE REPORT Performed at [facility]    Report Status PENDING   Urine culture     Status: None   Collection Time: 02/21/14  5:23 PM  Result Value Ref Range   Specimen Description URINE, CATHETERIZED    Special Requests NONE    Culture  Setup Time      02/21/2014 22:52 Performed at [facility]      >=100,000 COLONIES/ML Performed at [facility]    Culture      ESCHERICHIA COLI Performed at [facility]    Report Status 02/24/2014 FINAL    Organism ID, Bacteria ESCHERICHIA COLI       Susceptibility   Escherichia coli - MIC*    AMPICILLIN 4 SENSITIVE Sensitive     CEFAZOLIN <=4 SENSITIVE Sensitive     CEFTRIAXONE <=1 SENSITIVE Sensitive     CIPROFLOXACIN <=0.25 SENSITIVE Sensitive     GENTAMICIN <=1 SENSITIVE Sensitive     LEVOFLOXACIN <=0.12 SENSITIVE Sensitive     NITROFURANTOIN 32 SENSITIVE Sensitive     TOBRAMYCIN <=1 SENSITIVE Sensitive     TRIMETH/SULFA <=20 SENSITIVE Sensitive     PIP/TAZO <=4 SENSITIVE Sensitive     * ESCHERICHIA COLI  Magnesium     Status: None   Collection Time: 02/21/14  6:35 PM  Result Value  Ref Range   Magnesium 1.7 1.5 - 2.5 mg/dL  CBC     Status: Abnormal   Collection Time: 02/21/14  6:35 PM  Result Value Ref Range   WBC 7.5 4.0 - 10.5 K/uL   RBC 4.91 3.87 - 5.11 MIL/uL   Hemoglobin 15.3 (H) 12.0 - 15.0 g/dL   HCT 43.5 36.0 - 46.0 %   MCV 88.6 78.0 - 100.0 fL   MCH 31.2 26.0 - 34.0 pg   MCHC 35.2 30.0 - 36.0 g/dL   RDW 13.6 11.5 - 15.5 %   Platelets 191 150 - 400 K/uL  Creatinine, serum     Status: Abnormal   Collection Time: 02/21/14  6:35 PM  Result Value Ref Range   Creatinine, Ser 0.74 0.50 - 1.10 mg/dL   GFR calc non Af Amer 83 (L) >90 mL/min   GFR calc Af Amer >90 >90 mL/min    Comment: (NOTE) The eGFR has been calculated using the CKD EPI equation. This calculation has not been validated in all clinical situations. eGFR's persistently <90 mL/min signify possible Chronic Kidney Disease.   Troponin I     Status: None   Collection Time: 02/21/14  6:35 PM  Result Value Ref Range   Troponin I <0.30 <0.30 ng/mL    Comment:        Due to the release kinetics of cTnI, a negative result within the first hours of the onset of symptoms does not rule out myocardial infarction with certainty. If myocardial infarction is still suspected, repeat the test at appropriate intervals.   Hemoglobin A1c     Status: None   Collection Time: 02/21/14  6:35 PM  Result Value Ref Range   Hgb A1c MFr Bld 5.4 <5.7 %    Comment: (NOTE)                                                                       According to the ADA Clinical Practice Recommendations for 2011, when HbA1c is used as a screening test:  >=6.5%   Diagnostic of Diabetes Mellitus           (if abnormal result is confirmed) 5.7-6.4%   Increased risk of developing Diabetes Mellitus References:Diagnosis and Classification of Diabetes Mellitus,Diabetes WPYK,9983,38(SNKNL 1):S62-S69 and Standards of Medical Care in         Diabetes -  2011,Diabetes BTDV,7616,07 (Suppl 1):S11-S61.    Mean Plasma Glucose 108  <117 mg/dL    Comment: Performed at [facility]  TSH     Status: None   Collection Time: 02/21/14  6:35 PM  Result Value Ref Range   TSH 1.320 0.350 - 4.500 uIU/mL    Comment: Performed at [place] b natriuretic peptide     Status: Abnormal   Collection Time: 02/21/14  6:35 PM  Result Value Ref Range   Pro B Natriuretic peptide (BNP) 166.9 (H) 0 - 125 pg/mL  Uric acid     Status: None   Collection Time: 02/21/14  6:35 PM  Result Value Ref Range   Uric Acid, Serum 4.6 2.4 - 7.0 mg/dL  Clostridium Difficile by PCR     Status: None   Collection Time: 02/21/14  9:00 PM  Result Value Ref Range   C difficile by pcr NEGATIVE NEGATIVE    Comment: Performed at [facility]  Troponin I     Status: None   Collection Time: 02/22/14 12:20 AM  Result Value Ref Range   Troponin I <0.30 <0.30 ng/mL    Comment:        Due to the release kinetics of cTnI, a negative result within the first hours of the onset of symptoms does not rule out myocardial infarction with certainty. If myocardial infarction is still suspected, repeat the test at appropriate intervals.   CBC     Status: None   Collection Time: 02/22/14  6:20 AM  Result Value Ref Range   WBC 5.6 4.0 - 10.5 K/uL   RBC 4.38 3.87 - 5.11 MIL/uL   Hemoglobin 13.2 12.0 - 15.0 g/dL   HCT 39.2 36.0 - 46.0 %   MCV 89.5 78.0 - 100.0 fL   MCH 30.1 26.0 - 34.0 pg   MCHC 33.7 30.0 - 36.0 g/dL   RDW 13.7 11.5 - 15.5 %   Platelets 153 150 - 400 K/uL  Comprehensive metabolic panel     Status: Abnormal   Collection Time: 02/22/14  6:20 AM  Result Value Ref Range   Sodium 137 137 - 147 mEq/L   Potassium 2.9 (LL) 3.7 - 5.3 mEq/L    Comment: CRITICAL RESULT CALLED TO, READ BACK BY AND VERIFIED WITH: Aliene Altes RN AT 0725 ON 12.9.15 BY SHUEA    Chloride 101 96 - 112 mEq/L   CO2 25 19 - 32 mEq/L   Glucose, Bld 112 (H) 70 - 99 mg/dL   BUN 5 (L) 6 - 23 mg/dL   Creatinine, Ser 0.75 0.50 - 1.10 mg/dL   Calcium 8.2  (L) 8.4 - 10.5 mg/dL   Total Protein 5.8 (L) 6.0 - 8.3 g/dL   Albumin 2.7 (L) 3.5 - 5.2 g/dL   AST 21 0 - 37 U/L   ALT 10 0 - 35 U/L   Alkaline Phosphatase 103 39 - 117 U/L   Total Bilirubin 0.5 0.3 - 1.2 mg/dL   GFR calc non Af Amer 83 (L) >90 mL/min   GFR calc Af Amer >90 >90 mL/min    Comment: (NOTE) The eGFR has been calculated using the CKD EPI equation. This calculation has not been validated in all clinical situations. eGFR's persistently <90 mL/min signify possible Chronic Kidney Disease.    Anion gap 11 5 - 15  Troponin I     Status: None   Collection Time: 02/22/14  6:20 AM  Result Value Ref Range  Troponin I <0.30 <0.30 ng/mL    Comment:        Due to the release kinetics of cTnI, a negative result within the first hours of the onset of symptoms does not rule out myocardial infarction with certainty. If myocardial infarction is still suspected, repeat the test at appropriate intervals.   Ammonia     Status: None   Collection Time: 02/22/14  6:20 AM  Result Value Ref Range   Ammonia 43 11 - 60 umol/L  Ammonia     Status: Abnormal   Collection Time: 02/22/14  4:42 PM  Result Value Ref Range   Ammonia 10 (L) 11 - 60 umol/L  Hepatitis B surface antigen     Status: None   Collection Time: 02/22/14  4:42 PM  Result Value Ref Range   Hepatitis B Surface Ag NEGATIVE NEGATIVE    Comment: Performed at [facility]  Hepatitis C antibody     Status: None   Collection Time: 02/22/14  4:42 PM  Result Value Ref Range   HCV Ab NEGATIVE NEGATIVE    Comment: Performed at [facility]  AFP tumor marker     Status: None   Collection Time: 02/22/14  4:42 PM  Result Value Ref Range   AFP-Tumor Marker 5.6 <6.1 ng/mL    Comment: (NOTE) Patients < 4 month old: * Pediatric range is based on full term neonates, values for premature infants may be higher. Female: The use of AFP as a Tumor Marker in pregnant patients is not recommended. This test was performed  using the Beckman Coulter chemiluminescent method. Values obtained from different assay methods cannot be used interchangeably. AFP levels, regardless of value, should not be interpreted as absolute evidence of the presence or absence of disease. Performed at [facility]   ANA     Status: None   Collection Time: 02/22/14  4:42 PM  Result Value Ref Range   ANA Ser Ql NEGATIVE NEGATIVE    Comment: Performed at [facility]  C-reactive protein     Status: Abnormal   Collection Time: 02/22/14  6:42 PM  Result Value Ref Range   CRP 0.7 (H) <0.60 mg/dL    Comment: Performed at [facility]  Sedimentation rate     Status: None   Collection Time: 02/22/14  6:42 PM  Result Value Ref Range   Sed Rate 9 0 - 22 mm/hr  Basic metabolic panel     Status: Abnormal   Collection Time: 02/23/14  4:18 AM  Result Value Ref Range   Sodium 144 137 - 147 mEq/L    Comment: REPEATED TO VERIFY DELTA CHECK NOTED    Potassium 3.2 (L) 3.7 - 5.3 mEq/L   Chloride 107 96 - 112 mEq/L   CO2 27 19 - 32 mEq/L   Glucose, Bld 109 (H) 70 - 99 mg/dL   BUN 5 (L) 6 - 23 mg/dL   Creatinine, Ser 0.80 0.50 - 1.10 mg/dL   Calcium 8.4 8.4 - 10.5 mg/dL   GFR calc non Af Amer 72 (L) >90 mL/min   GFR calc Af Amer 83 (L) >90 mL/min    Comment: (NOTE) The eGFR has been calculated using the CKD EPI equation. This calculation has not been validated in all clinical situations. eGFR's persistently <90 mL/min signify possible Chronic Kidney Disease.    Anion gap 10 5 - 15  CBC     Status: None   Collection Time: 02/24/14  4:05 AM  Result Value Ref Range   WBC 5.9 4.0 - 10.5 K/uL   RBC 4.33 3.87 - 5.11 MIL/uL   Hemoglobin 12.9 12.0 - 15.0 g/dL   HCT 39.0 36.0 - 46.0 %   MCV 90.1 78.0 - 100.0 fL   MCH 29.8 26.0 - 34.0 pg   MCHC 33.1 30.0 - 36.0 g/dL   RDW 13.6 11.5 - 15.5 %   Platelets 164 150 - 400 K/uL  Basic metabolic panel     Status: Abnormal   Collection Time: 02/24/14  4:05 AM  Result  Value Ref Range   Sodium 140 137 - 147 mEq/L   Potassium 3.1 (L) 3.7 - 5.3 mEq/L   Chloride 104 96 - 112 mEq/L   CO2 26 19 - 32 mEq/L   Glucose, Bld 107 (H) 70 - 99 mg/dL   BUN 5 (L) 6 - 23 mg/dL   Creatinine, Ser 0.81 0.50 - 1.10 mg/dL   Calcium 8.5 8.4 - 10.5 mg/dL   GFR calc non Af Amer 71 (L) >90 mL/min   GFR calc Af Amer 82 (L) >90 mL/min    Comment: (NOTE) The eGFR has been calculated using the CKD EPI equation. This calculation has not been validated in all clinical situations. eGFR's persistently <90 mL/min signify possible Chronic Kidney Disease.    Anion gap 10 5 - 15   Labs are reviewed.  Current Facility-Administered Medications  Medication Dose Route Frequency Provider Last Rate Last Dose  . 0.9 %  sodium chloride infusion   Intravenous Continuous Reyne Dumas, MD 100 mL/hr at 02/23/14 0050    . acetaminophen (TYLENOL) tablet 650 mg  650 mg Oral Q6H PRN Reyne Dumas, MD   650 mg at 02/23/14 2033   Or  . acetaminophen (TYLENOL) suppository 650 mg  650 mg Rectal Q6H PRN Reyne Dumas, MD      . citalopram (CELEXA) tablet 20 mg  20 mg Oral QHS Durward Parcel, MD   20 mg at 02/23/14 2150  . enoxaparin (LOVENOX) injection 40 mg  40 mg Subcutaneous Q24H Reyne Dumas, MD   40 mg at 02/23/14 1821  . folic acid (FOLVITE) tablet 1 mg  1 mg Oral Daily Threasa Beards, MD   1 mg at 02/24/14 4037  . gabapentin (NEURONTIN) capsule 300 mg  300 mg Oral QHS Reyne Dumas, MD   300 mg at 02/23/14 2151  . hydrOXYzine (ATARAX/VISTARIL) tablet 50 mg  50 mg Oral QHS Reyne Dumas, MD   50 mg at 02/23/14 2151  . lactulose (CHRONULAC) 10 GM/15ML solution 10 g  10 g Oral TID Reyne Dumas, MD   10 g at 02/24/14 5436  . levalbuterol (XOPENEX) nebulizer solution 0.63 mg  0.63 mg Nebulization Q6H PRN Reyne Dumas, MD      . LORazepam (ATIVAN) tablet 1 mg  1 mg Oral Q6H PRN Threasa Beards, MD   1 mg at 02/24/14 0036   Or  . LORazepam (ATIVAN) injection 1 mg  1 mg Intravenous Q6H PRN  Threasa Beards, MD      . LORazepam (ATIVAN) tablet 0-4 mg  0-4 mg Oral Q12H Threasa Beards, MD   0 mg at 02/23/14 2200  . multivitamin with minerals tablet 1 tablet  1 tablet Oral Daily Threasa Beards, MD   1 tablet at 02/24/14 [phone]  . ondansetron (ZOFRAN) tablet 4 mg  4 mg Oral Q6H PRN Reyne Dumas, MD   4 mg at 02/24/14 0955   Or  .  ondansetron (ZOFRAN) injection 4 mg  4 mg Intravenous Q6H PRN Reyne Dumas, MD   4 mg at 02/22/14 1117  . oxyCODONE (Oxy IR/ROXICODONE) immediate release tablet 5 mg  5 mg Oral Q6H PRN Reyne Dumas, MD   5 mg at 02/24/14 0954  . potassium chloride SA (K-DUR,KLOR-CON) CR tablet 20 mEq  20 mEq Oral BID Reyne Dumas, MD   20 mEq at 02/24/14 0938  . sodium chloride 0.9 % injection 3 mL  3 mL Intravenous Q12H Reyne Dumas, MD   0 mL at 02/21/14 2143  . thiamine (VITAMIN B-1) tablet 100 mg  100 mg Oral Daily Threasa Beards, MD   100 mg at 02/24/14 5868   Or  . thiamine (B-1) injection 100 mg  100 mg Intravenous Daily Threasa Beards, MD      . traZODone (DESYREL) tablet 50 mg  50 mg Oral QHS Durward Parcel, MD   50 mg at 02/23/14 2151    Psychiatric Specialty Exam: Physical Exam as per history and physical   ROS generalized weakness, tired, isolated, poor distance of sleep and appetite and fine tremors   Blood pressure 159/76, pulse 88, temperature 97.5 F (36.4 C), temperature source Oral, resp. rate 18, height '5\' 8"'  (1.727 m), weight 90.719 kg (200 lb), SpO2 93 %.Body mass index is 30.42 kg/(m^2).  General Appearance: Casual  Eye Contact::  Fair  Speech:  Clear and Coherent and Slow  Volume:  Normal  Mood:  Anxious and Depressed  Affect:  Constricted and Depressed  Thought Process:  Coherent and Goal Directed  Orientation:  Full (Time, Place, and Person)  Thought Content:  WDL  Suicidal Thoughts:  No  Homicidal Thoughts:  No  Memory:  Immediate;   Fair Recent;   Fair  Judgement:  Fair  Insight:  Fair  Psychomotor Activity:  Decreased   Concentration:  Good  Recall:  Good  Fund of Knowledge:Good  Language: Good  Akathisia:  NA  Handed:  Right  AIMS (if indicated):     Assets:  Communication Skills Desire for Improvement Financial Resources/Insurance Housing Intimacy Leisure Time Resilience Social Support Talents/Skills Transportation  Sleep:      Musculoskeletal: Strength & Muscle Tone: decreased Gait & Station: unable to stand Patient leans: N/A  Treatment Plan Summary: Daily contact with patient to assess and evaluate symptoms and progress in treatment Medication management Continue citalopram 20 mg daily for depression anxiety Continue trazodone 50 mg at bedtime for insomnia  Lilinoe Acklin,JANARDHAHA R. 02/24/2014 12:04 PM

## 2014-02-24 NOTE — Progress Notes (Signed)
Clinical Social Work  Per chart review, patient to DC to SNF today. Psych CSW is signing off but available if further needs arise.  [place], [place] [phone]

## 2014-02-24 NOTE — Consult Note (Addendum)
Referring Provider: Dr. Carles Collet Primary Care Physician:  Jonathon Bellows, MD Primary Gastroenterologist:  Dr. Paulita Fujita  Reason for Consultation:  Confusion  HPI: Denise Kelly is a 72 y.o. female admitted for altered mental status in the setting of alcohol abuse stating that she drank a bottle of wine daily until last week. No evidence of cirrhosis on an abd MRI in 2014 where a liver lesion thought to represent a hemangioma was seen. No cirrhosis seen on U/S during this admit. Fatty liver noted on U/S. Denies abdominal pain. Moving bowels on Lactulose. Oriented to person, place, time. Nurse present during examination.  Past Medical History  Diagnosis Date  . Complication of anesthesia     "felt drunk for a week after"  . Hypertension     not being treated with medication  . Palpitations   . Anxiety   . GERD (gastroesophageal reflux disease)   . Arthritis   . Cancer     skin  . Alcohol abuse     ETOH and xanax    Past Surgical History  Procedure Laterality Date  . Tonsillectomy    . Tubal ligation    . 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 Breast surgery      2 breast biopsies  . Eye surgery Bilateral     cataracts  . Colonoscopy    . Total knee arthroplasty Right 01/02/2014    Dr Ronnie Derby  . Total knee arthroplasty Right 01/02/2014    Procedure: TOTAL KNEE ARTHROPLASTY;  Surgeon: [name], MD;  Location: [facility];  Service: Orthopedics;  Laterality: Right;  . Joint replacement Left 09/2009    knee  . Joint replacement Right 01/02/2014    Prior to Admission medications   Medication Sig Start Date End Date Taking? Authorizing Provider  acetaminophen (TYLENOL) 500 MG tablet Take 1,000 mg by mouth daily as needed for mild pain.   Yes Historical Provider, MD  citalopram (CELEXA) 10 MG tablet Take 10 mg by mouth at bedtime.  12/05/13  Yes Historical Provider, MD  furosemide (LASIX) 20 MG tablet Take 20 mg by mouth daily as needed for edema.  11/01/13  Yes Historical Provider, MD   gabapentin (NEURONTIN) 100 MG capsule Take 300 mg by mouth at bedtime.  10/31/13  Yes Historical Provider, MD  hydrOXYzine (ATARAX/VISTARIL) 25 MG tablet Take 50 mg by mouth at bedtime.  08/12/13  Yes Historical Provider, MD  loperamide (LOPERAMIDE A-D) 2 MG tablet Take 2 mg by mouth as needed for diarrhea or loose stools.   Yes Historical Provider, MD  loratadine (CLARITIN) 10 MG tablet Take 10 mg by mouth 4 (four) times daily as needed for itching.   Yes Historical Provider, MD  ondansetron (ZOFRAN-ODT) 4 MG disintegrating tablet Take 4 mg by mouth every 6 (six) hours as needed for nausea.  10/31/13  Yes Historical Provider, MD  OXYCONTIN 10 MG T12A 12 hr tablet Take 10 mg by mouth every 12 (twelve) hours.  02/14/14  Yes Historical Provider, MD  Phenazopyridine HCl (AZO TABS PO) Take 1-2 tablets by mouth daily as needed (vaginal atrophy).   Yes Historical Provider, MD  Polyethyl Glycol-Propyl Glycol (SYSTANE OP) Apply 1 drop to eye 3 (three) times daily as needed (dry eyes).   Yes Historical Provider, MD  polyethylene glycol (MIRALAX / GLYCOLAX) packet Take 17 g by mouth daily as needed for moderate constipation.    Yes Historical Provider, MD  potassium chloride SA (K-DUR,KLOR-CON) 20 MEQ  tablet Take 1 tablet (20 mEq total) by mouth 2 (two) times daily. 01/20/14  Yes Carlynn Spry, PA-C  Pseudoeph-Doxylamine-DM-APAP (NIGHTTIME PO) Take 1 tablet by mouth at bedtime as needed (sleep).   Yes Historical Provider, MD  senna-docusate (SENOKOT-S) 8.6-50 MG per tablet Take 2 tablets by mouth at bedtime.   Yes Historical Provider, MD  zolpidem (AMBIEN) 10 MG tablet Take 10 mg by mouth at bedtime as needed for sleep.  02/16/14  Yes Historical Provider, MD  amoxicillin (AMOXIL) 500 MG tablet Take 1 tablet (500 mg total) by mouth 3 (three) times daily. Patient not taking: Reported on 02/21/2014 01/20/14   Carlynn Spry, PA-C  methocarbamol (ROBAXIN) 500 MG tablet Take 1-2 tablets (500-1,000 mg total) by mouth every  6 (six) hours as needed for muscle spasms. Patient not taking: Reported on 02/21/2014 01/05/14   Carlynn Spry, PA-C  traMADol (ULTRAM) 50 MG tablet Take 1-2 tablets (50-100 mg total) by mouth every 4 (four) hours as needed for moderate pain. Patient not taking: Reported on 02/21/2014 01/05/14   Carlynn Spry, PA-C    Scheduled Meds: . citalopram  20 mg Oral QHS  . enoxaparin (LOVENOX) injection  40 mg Subcutaneous Q24H  . folic acid  1 mg Oral Daily  . gabapentin  300 mg Oral QHS  . hydrOXYzine  50 mg Oral QHS  . lactulose  10 g Oral TID  . LORazepam  0-4 mg Oral Q12H  . multivitamin with minerals  1 tablet Oral Daily  . potassium chloride SA  20 mEq Oral BID  . sodium chloride  3 mL Intravenous Q12H  . thiamine  100 mg Oral Daily   Or  . thiamine  100 mg Intravenous Daily  . traZODone  50 mg Oral QHS   Continuous Infusions: . sodium chloride 100 mL/hr at 02/23/14 0050   PRN Meds:.acetaminophen **OR** acetaminophen, levalbuterol, LORazepam **OR** LORazepam, ondansetron **OR** ondansetron (ZOFRAN) IV, oxyCODONE  Allergies as of 02/21/2014 - Review Complete 02/21/2014  Allergen Reaction Noted  . Sulfa antibiotics Hives and Swelling 02/04/2011  . Ciprofloxacin Itching 02/04/2011  . Motrin [ibuprofen] Palpitations 01/17/2014    History reviewed. No pertinent family history.  History   Social History  . Marital Status: Single    Spouse Name: N/A    Number of Children: N/A  . Years of Education: N/A   Occupational History  . Not on file.   Social History Main Topics  . Smoking status: Former Research scientist (life sciences)  . Smokeless tobacco: Never Used     Comment: quit smoking many years ago   . Alcohol Use: Yes  . Drug Use: No  . Sexual Activity: No   Other Topics Concern  . Not on file   Social History Narrative    Review of Systems: All negative from GI standpoint except as stated above in HPI.  Physical Exam: Vital signs: Filed Vitals:   02/24/14 0632  BP: 159/76  Pulse:  88  Temp: 97.5 F (36.4 C)  Resp: 18   Last BM Date: 02/23/14 General:   Alert,  Well-developed, well-nourished, no acute distress HEENT: anicteric Lungs:  Clear throughout to auscultation.   No wheezes, crackles, or rhonchi. No acute distress. Heart:  Regular rate and rhythm; no murmurs, clicks, rubs,  or gallops. Abdomen: soft, nontender, nondistended, +BS  Rectal:  Deferred Neuro: no asterixis  GI:  Lab Results:  Recent Labs  02/21/14 1835 02/22/14 0620 02/24/14 0405  WBC 7.5 5.6 5.9  HGB 15.3* 13.2 12.9  HCT  43.5 39.2 39.0  PLT 191 153 164   BMET  Recent Labs  02/22/14 0620 02/23/14 0418 02/24/14 0405  NA 137 144 140  K 2.9* 3.2* 3.1*  CL 101 107 104  CO2 25 27 26   GLUCOSE 112* 109* 107*  BUN 5* 5* 5*  CREATININE 0.75 0.80 0.81  CALCIUM 8.2* 8.4 8.5   LFT  Recent Labs  02/22/14 0620  PROT 5.8*  ALBUMIN 2.7*  AST 21  ALT 10  ALKPHOS 103  BILITOT 0.5   PT/INR No results for input(s): LABPROT, INR in the last 72 hours.   Studies/Results: US Abdomen Complete  02/23/2014   CLINICAL DATA:  Hepatic encephalopathy  EXAM: ULTRASOUND ABDOMEN COMPLETE  COMPARISON:  Ultrasound 07/03/2010  FINDINGS: Gallbladder: No gallstones or wall thickening visualized. No sonographic Murphy sign noted.  Common bile duct: Diameter: 9 mm in diameter mild prominent size  Liver: Diffuse increased echogenicity of the liver suspicious for fatty infiltration. There is a cyst in left hepatic lobe measures 1.3 x 1.2 cm.  IVC: No abnormality visualized.  Pancreas: Visualized portion unremarkable.  Spleen: Size and appearance within normal limits.  6.4 cm in length.  Right Kidney: Length: 10.5 cm. Echogenicity within normal limits. No mass or hydronephrosis visualized.  Left Kidney: Length: 12 cm. Echogenicity within normal limits. No mass or hydronephrosis visualized.  Abdominal aorta: No aneurysm visualized. Measures up to 1.7 cm in diameter. No abdominal ascites.  Other findings:  None.  IMPRESSION: 1. No gallstones are noted within gallbladder. 2. CBD measures 9 mm in diameter mild prominent in size. No intrahepatic biliary ductal dilatation. 3. Mild increased echogenicity of the liver suspicious for fatty infiltration. There is a cyst in left hepatic lobe measures 1.3 cm. 4. No hydronephrosis.  No renal calculi. 5. No aortic aneurysm.   Electronically Signed   By: Lahoma Crocker M.D.   On: 02/23/2014 10:40    Impression/Plan: Altered mental status likely due to alcohol. No evidence of cirrhosis and doubt her symptoms were due to hepatic encephalopathy. No GI workup needed at this time. Ok to stop Lactulose. F/U with Dr. Paulita Fujita in 2-3 months. Will sign off. Call if questions.    LOS: 3 days   Willow C.  02/24/2014, 9:44 AM

## 2014-02-24 NOTE — Progress Notes (Addendum)
Patient is set to discharge to [facility] today. Patient, [name] aware.   Discharge packet will be given to RN, Estill Bamberg.    Daughter, Denise Kelly (ph#: [phone])will transport when ready.   Clinical Social Work Department CLINICAL SOCIAL WORK PLACEMENT NOTE 02/24/2014  Patient:  Denise Kelly, Denise Kelly  Account Number:  [id number] Admit date:  02/21/2014  Clinical Social Worker:  Renold Genta  Date/time:  02/23/2014 04:32 PM  Clinical Social Work is seeking post-discharge placement for this patient at the following level of care:   SKILLED NURSING   (*CSW will update this form in Epic as items are completed)   02/23/2014  Patient/family provided with [facility] Department of Clinical Social Work's list of facilities offering this level of care within the geographic area requested by the patient (or if unable, by the patient's family).  02/23/2014  Patient/family informed of their freedom to choose among providers that offer the needed level of care, that participate in Medicare, Medicaid or managed care program needed by the patient, have an available bed and are willing to accept the patient.  02/23/2014  Patient/family informed of [facility]' ownership interest in [facility], as well as of the fact that they are under no obligation to receive care at this facility.  PASARR submitted to EDS on 02/23/2014 PASARR number received on 02/23/2014  FL2 transmitted to all facilities in geographic area requested by pt/family on  02/23/2014 FL2 transmitted to all facilities within larger geographic area on   Patient informed that his/her managed care company has contracts with or will negotiate with  certain facilities, including the following:     Patient/family informed of bed offers received:  02/23/2014 Patient chooses bed at [facility], [place] Physician recommends and patient chooses bed at    Patient to be  transferred to [facility] on  02/24/2014 Patient to be transferred to facility by daughter, Denise Kelly Patient and family notified of transfer on 02/24/2014 Name of family member notified:  patient's daughter, Denise Kelly via phone  The following physician request were entered in Epic:   Additional Comments:    Raynaldo Opitz, [place] Social Worker cell #: [phone]

## 2014-02-24 NOTE — Discharge Summary (Signed)
Physician Discharge Summary  Denise Kelly RAQ:[id number] DOB: [birth year] DOA: 02/21/2014  PCP: [name], D, MD  Admit date: 02/21/2014 Discharge date: 02/24/2014  Recommendations for Outpatient Follow-up:  1. Pt will need to follow up with PCP in 2 weeks post discharge 2. Please obtain BMP in one week  Discharge Diagnoses:  Acute encephalopathy -Multifactorial including hepatic encephalopathy  with possibility of alcohol withdrawal and infectious process -back to baseline per daughter Hepatic encephalopathy -Mentation has improved with lactulose and decreasing ammonia -Ammonia improved from 74--> 43-->10 -Abdominal ultrasound to evaluate the liver and for ascites--negative ascites, fatty liver, left hepatic cyst, no hydronephrosis -Hepatitis B surface antigen, hepatitis C antibody, ANA--negative -consult [facility] -Case was discussed with Dr. Michail Sermon on day of d/c who saw pt--he did not feel pt has cirrhosis and that her clinical presentation was likely due to her chronic alcohol use--he did not feel that the patient needed to continue lactulose at this time, but the patient needs to follow-up with Dr. Paulita Fujita in the outpatient setting  Right knee pain/right knee effusion -Status post right knee TKA-01/02/2014 -X-ray reveals suprapatellar effusion--ESR 9, CRP 0.7 -Given the patient's recent bacteremia with [facility] in Nov. 2015, concerned about possible seeding infection -spoke with Dr. Ronnie Derby about possible arthrocentesis--however, he did not feel that the knee was infected at this time and recommended WBAT -d/c abx for now to maximize cultures Alcohol abuse -Alcohol withdrawal protocol -Last drink was possibly 7 prior to admission Depression/substance induced mood disorder -appreciate psychiatry--> increased Celexa to 20 mg daily, added trazodone at bedtime -Patient states that Celexa is not working well for her -Daughter request a psychiatry evaluation which I have called  atypical chest pain  -CT angiogram of the chest negative for pulmonary embolus or dissection  -Troponins negative 3  -EKG without concerning ST-T wave changes     Family Communication: Daughter updated at beside--total time 35 min, >50% spent counseling patient and coordinating care Disposition Plan: SNF 02/24/14 if stable  Discharge Condition: stable  Disposition:  Follow-up Information    Follow up with LUCEY,STEPHEN D, MD In 2 weeks.   Specialty:  Orthopedic Surgery   Contact information:   [address] [place] [address] [phone]       Follow up with Landry Dyke, MD In 1 month.   Specialty:  Gastroenterology   Contact information:   [address] [place] [address] [phone]     SNF  Diet:low sodium Wt Readings from Last 3 Encounters:  02/21/14 90.719 kg (200 lb)  01/19/14 98.57 kg (217 lb 4.9 oz)  01/04/14 99.701 kg (219 lb 12.8 oz)    History of present illness:  73 year old morbidly female , status post knee arthroplasty , recently admitted 11/4 for altered mental status, right knee pain, urinary tract infection , presents to the ER with a similar presentation today. Apparently Dr. Ronnie Derby had examined the patient knee in November during this admission, and he did not suspect any infection. Patient was found to have Escherichia coli bacteremia and Escherichia coli in her urine, treated with Rocephin in the hospital and subsequently discharged with amoxicillin of which pt endorsed compliance. Patient developed chronic diarrhea for approximately a month. Now states that the diarrhea has resolved. Patient states that she has not had any diarrhea in 1 week. Patient has had persistent right leg pain had a venous Doppler done 11/12 that did not show any evidence of DVT or Baker's cyst. Patient presents 02/21/2014 02/21/2014 because of  persistent right knee pain and confusion. The patient was found to have an elevated ammonia  level of 74. She was started on lactulose with good clinical response. Troponins were negative 3. EKG did not show any acute ischemic changes. Urinalysis did not show any significant pyuria. The patient's antibiotics were discontinued. She remained afebrile on hemodynamics are stable. Orthopedics was consulted for her right knee pain. ESR was 9, and CRP was 8.7. They did not feel that the patient's knee pain was achievable to an infectious process. The patient to stay with physical therapy. They recommended a skilled nursing facility with which the patient and daughter agreed. The patient's daughter was concerned that the patient's depression is contributing to her intermittent confusion. Psychiatry was consulted. They recommended increasing the patient's Celexa dose to 20 mg daily and adding trazodone 50 mg at bedtime.     Consultants: Ortho--[facility]--Dr. [facility]--[facility]  Discharge Exam: Filed Vitals:   02/24/14 0632  BP: 159/76  Pulse: 88  Temp: 97.5 F (36.4 C)  Resp: 18   Filed Vitals:   02/23/14 0526 02/23/14 1251 02/23/14 2229 02/24/14 0632  BP: 156/89 158/89 143/76 159/76  Pulse: 83 97 92 88  Temp: 98.1 F (36.7 C) 97.3 F (36.3 C) 98.2 F (36.8 C) 97.5 F (36.4 C)  TempSrc: Oral Axillary Oral Oral  Resp: _0 Height:      Weight:      SpO2: 98% 99% 96% 93%   General: A&O x 3, NAD, pleasant, cooperative Cardiovascular: RRR, no rub, no gallop, no S3 Respiratory: CTAB, no wheeze, no rhonchi Abdomen:soft, nontender, nondistended, positive bowel sounds Extremities: 1+LE edema, No lymphangitis, no petechiae;  Right knee warm to touch without erythema or crepitance  Discharge Instructions      Discharge Instructions    Diet - low sodium heart healthy    Complete by:  As directed      Increase activity slowly    Complete by:  As directed             Medication List    STOP taking these medications        amoxicillin 500 MG tablet    Commonly known as:  AMOXIL     AZO TABS PO     LOPERAMIDE A-D 2 MG tablet  Generic drug:  loperamide     NIGHTTIME PO     OXYCONTIN 10 mg T12a 12 hr tablet  Generic drug:  OxyCODONE  Replaced by:  oxyCODONE 5 MG immediate release tablet     traMADol 50 MG tablet  Commonly known as:  ULTRAM      TAKE these medications        acetaminophen 500 MG tablet  Commonly known as:  TYLENOL  Take 1,000 mg by mouth daily as needed for mild pain.     citalopram 20 MG tablet  Commonly known as:  CELEXA  Take 1 tablet (20 mg total) by mouth at bedtime.     CLARITIN 10 MG tablet  Generic drug:  loratadine  Take 10 mg by mouth 4 (four) times daily as needed for itching.     furosemide 20 MG tablet  Commonly known as:  LASIX  Take 20 mg by mouth daily as needed for edema.     gabapentin 100 MG capsule  Commonly known as:  NEURONTIN  Take 300 mg by mouth at bedtime.     hydrOXYzine 25 MG tablet  Commonly known as:  ATARAX/VISTARIL  Take  50 mg by mouth at bedtime.     methocarbamol 500 MG tablet  Commonly known as:  ROBAXIN  Take 1-2 tablets (500-1,000 mg total) by mouth every 6 (six) hours as needed for muscle spasms.     ondansetron 4 MG disintegrating tablet  Commonly known as:  ZOFRAN-ODT  Take 4 mg by mouth every 6 (six) hours as needed for nausea.     oxyCODONE 5 MG immediate release tablet  Commonly known as:  Oxy IR/ROXICODONE  Take 1 tablet (5 mg total) by mouth every 6 (six) hours as needed for moderate pain.     polyethylene glycol packet  Commonly known as:  MIRALAX / GLYCOLAX  Take 17 g by mouth daily as needed for moderate constipation.     potassium chloride SA 20 MEQ tablet  Commonly known as:  K-DUR,KLOR-CON  Take 1 tablet (20 mEq total) by mouth 2 (two) times daily.     senna-docusate 8.6-50 MG per tablet  Commonly known as:  Senokot-S  Take 2 tablets by mouth at bedtime.     SYSTANE OP  Apply 1 drop to eye 3 (three) times daily as needed (dry  eyes).     traZODone 50 MG tablet  Commonly known as:  DESYREL  Take 1 tablet (50 mg total) by mouth at bedtime.     zolpidem 10 MG tablet  Commonly known as:  AMBIEN  Take 10 mg by mouth at bedtime as needed for sleep.         The results of significant diagnostics from this hospitalization (including imaging, microbiology, ancillary and laboratory) are listed below for reference.    Significant Diagnostic Studies: Dg Chest 2 View  02/21/2014   CLINICAL DATA:  Shortness of breath.  EXAM: CHEST  2 VIEW  COMPARISON:  January 17, 2014.  FINDINGS: The heart size and mediastinal contours are within normal limits. Both lungs are clear. No pneumothorax or pleural effusion is noted. The visualized skeletal structures are unremarkable.  IMPRESSION: No acute cardiopulmonary abnormality seen.   Electronically Signed   By: Sabino Dick M.D.   On: 02/21/2014 15:13   Ct Head Wo Contrast  02/21/2014   CLINICAL DATA:  Increasing confusion. Altered mental status. History of carotid cavernous sinus fistula.  EXAM: CT HEAD WITHOUT CONTRAST  TECHNIQUE: Contiguous axial images were obtained from the base of the skull through the vertex without intravenous contrast.  COMPARISON:  None.  FINDINGS: There is extensive high density material in the region of the left cavernous sinus consistent with including from previous treatment for carotid cavernous sinus fistula. There is a small collection of the dense material in the left temporal fossa laterally. This material obscures some detail.  There is a tiny old lacunar infarct in the right basal ganglia. Brain parenchyma otherwise appears normal other than mild atrophy with secondary slight ventricular dilatation. There is no acute intracranial hemorrhage, infarction, or mass lesion. Osseous structures are normal.  IMPRESSION: No acute abnormality. Previous treatment for left carotid cavernous sinus fistula. Tiny old lacunar infarct in the right basal ganglia.    Electronically Signed   By: Rozetta Nunnery M.D.   On: 02/21/2014 15:45   Ct Angio Chest Pe W/cm &/or Wo Cm  02/21/2014   CLINICAL DATA:  Short of breath  EXAM: CT ANGIOGRAPHY CHEST WITH CONTRAST  TECHNIQUE: Multidetector CT imaging of the chest was performed using the standard protocol during bolus administration of intravenous contrast. Multiplanar CT image reconstructions and MIPs were obtained to  evaluate the vascular anatomy.  CONTRAST:  169m OMNIPAQUE IOHEXOL 350 MG/ML SOLN  COMPARISON:  CT chest 04/23/2009  FINDINGS: The patient could not hold her breath and the image quality is degraded by significant motion.  Negative for pulmonary emboli. Small emboli could be missed on the study due to motion. Negative for aortic dissection. Mild atherosclerotic disease in the aorta. Coronary artery calcification. Mild cardiac enlargement without pericardial effusion.  Calcified granuloma right upper lobe unchanged from the prior CT. Negative for infiltrate or effusion. Negative for mass or adenopathy.  No acute abnormality in the upper abdomen. No acute thoracic spine abnormality.  Review of the MIP images confirms the above findings.  IMPRESSION: Image quality degraded by motion from breathing  Negative for pulmonary embolism.  No acute abnormality.   Electronically Signed   By: CFranchot GalloM.D.   On: 02/21/2014 15:50   UKoreaAbdomen Complete  02/23/2014   CLINICAL DATA:  Hepatic encephalopathy  EXAM: ULTRASOUND ABDOMEN COMPLETE  COMPARISON:  Ultrasound 07/03/2010  FINDINGS: Gallbladder: No gallstones or wall thickening visualized. No sonographic Murphy sign noted.  Common bile duct: Diameter: 9 mm in diameter mild prominent size  Liver: Diffuse increased echogenicity of the liver suspicious for fatty infiltration. There is a cyst in left hepatic lobe measures 1.3 x 1.2 cm.  IVC: No abnormality visualized.  Pancreas: Visualized portion unremarkable.  Spleen: Size and appearance within normal limits.  6.4 cm in  length.  Right Kidney: Length: 10.5 cm. Echogenicity within normal limits. No mass or hydronephrosis visualized.  Left Kidney: Length: 12 cm. Echogenicity within normal limits. No mass or hydronephrosis visualized.  Abdominal aorta: No aneurysm visualized. Measures up to 1.7 cm in diameter. No abdominal ascites.  Other findings: None.  IMPRESSION: 1. No gallstones are noted within gallbladder. 2. CBD measures 9 mm in diameter mild prominent in size. No intrahepatic biliary ductal dilatation. 3. Mild increased echogenicity of the liver suspicious for fatty infiltration. There is a cyst in left hepatic lobe measures 1.3 cm. 4. No hydronephrosis.  No renal calculi. 5. No aortic aneurysm.   Electronically Signed   By: LLahoma CrockerM.D.   On: 02/23/2014 10:40   Dg Knee Complete 4 Views Right  02/21/2014   CLINICAL DATA:  Status post fall after total knee replacement on 11/19, redness/swelling  EXAM: RIGHT KNEE - COMPLETE 4+ VIEW  COMPARISON:  01/03/2014  FINDINGS: Right total knee arthroplasty in satisfactory position. No evidence of hardware complication or loosening.  No fracture or dislocation is seen.  Moderate suprapatellar knee joint effusion.  IMPRESSION: Right total knee arthroplasty in satisfactory position. No evidence of complication.  Moderate suprapatellar knee joint effusion.   Electronically Signed   By: SJulian HyM.D.   On: 02/21/2014 20:00     Microbiology: Recent Results (from the past 240 hour(s))  Blood culture (routine x 2)     Status: None (Preliminary result)   Collection Time: 02/21/14  5:03 PM  Result Value Ref Range Status   Specimen Description BLOOD LEFT ANTECUBITAL  Final   Special Requests BOTTLES DRAWN AEROBIC AND ANAEROBIC 5CC  Final   Culture  Setup Time   Final    02/21/2014 22:41 [facility]formed at [facility]   Culture   Final           BLOOD CULTURE RECEIVED NO GROWTH TO DATE CULTURE WILL BE HELD FOR 5 DAYS BEFORE ISSUING A FINAL NEGATIVE REPORT Performed  at [facility]  Report Status PENDING  Incomplete  Blood culture (routine x 2)     Status: None (Preliminary result)   Collection Time: 02/21/14  5:05 PM  Result Value Ref Range Status   Specimen Description BLOOD LEFT ANTECUBITAL  Final   Special Requests BOTTLES DRAWN AEROBIC AND ANAEROBIC 5CC  Final   Culture  Setup Time   Final    02/21/2014 22:41 Performed at [facility]    Culture   Final           BLOOD CULTURE RECEIVED NO GROWTH TO DATE CULTURE WILL BE HELD FOR 5 DAYS BEFORE ISSUING A FINAL NEGATIVE REPORT Performed at [facility]    Report Status PENDING  Incomplete  Urine culture     Status: None   Collection Time: 02/21/14  5:23 PM  Result Value Ref Range Status   Specimen Description URINE, CATHETERIZED  Final   Special Requests NONE  Final   Culture  Setup Time   Final    02/21/2014 22:52 Performed at [facility]   Final    >=100,000 COLONIES/ML Performed at [facility]    Culture   Final    ESCHERICHIA COLI Performed at [facility]    Report Status 02/24/2014 FINAL  Final   Organism ID, Bacteria ESCHERICHIA COLI  Final      Susceptibility   Escherichia coli - MIC*    AMPICILLIN 4 SENSITIVE Sensitive     CEFAZOLIN <=4 SENSITIVE Sensitive     CEFTRIAXONE <=1 SENSITIVE Sensitive     CIPROFLOXACIN <=0.25 SENSITIVE Sensitive     GENTAMICIN <=1 SENSITIVE Sensitive     LEVOFLOXACIN <=0.12 SENSITIVE Sensitive     NITROFURANTOIN 32 SENSITIVE Sensitive     TOBRAMYCIN <=1 SENSITIVE Sensitive     TRIMETH/SULFA <=20 SENSITIVE Sensitive     PIP/TAZO <=4 SENSITIVE Sensitive     * ESCHERICHIA COLI  Clostridium Difficile by PCR     Status: None   Collection Time: 02/21/14  9:00 PM  Result Value Ref Range Status   C difficile by pcr NEGATIVE NEGATIVE Final    Comment: Performed at [facility]: Basic Metabolic Panel:  Recent Labs Lab 02/21/14 1359 02/21/14 1835  02/22/14 0620 02/23/14 0418 02/24/14 0405  NA 143  --  137 144 140  K 3.3*  --  2.9* 3.2* 3.1*  CL 98  --  101 107 104  CO2 22  --  _0 GLUCOSE 137*  --  112* 109* 107*  BUN 7  --  5* 5* 5*  CREATININE 0.72 0.74 0.75 0.80 0.81  CALCIUM 10.1  --  8.2* 8.4 8.5  MG  --  1.7  --   --   --    Liver Function Tests:  Recent Labs Lab 02/21/14 1359 02/22/14 0620  AST 34 21  ALT 17 10  ALKPHOS 142* 103  BILITOT 0.6 0.5  PROT 7.9 5.8*  ALBUMIN 3.6 2.7*   No results for input(s): LIPASE, AMYLASE in the last 168 hours.  Recent Labs Lab 02/21/14 1359 02/22/14 0620 02/22/14 1642  AMMONIA 74* 43 10*   CBC:  Recent Labs Lab 02/21/14 1359 02/21/14 1835 02/22/14 0620 02/24/14 0405  WBC 7.1 7.5 5.6 5.9  HGB 16.7* 15.3* 13.2 12.9  HCT 46.8* 43.5 39.2 39.0  MCV 86.7 88.6 89.5 90.1  PLT 230 191 153 164   Cardiac Enzymes:  Recent Labs Lab  02/21/14 1835 02/22/14 0020 02/22/14 0620  TROPONINI <0.30 <0.30 <0.30   BNP: Invalid input(s): POCBNP CBG: No results for input(s): GLUCAP in the last 168 hours.  Time coordinating discharge:  Greater than 30 minutes  Signed:  Indya Oliveria, DO [facility] Pager: [phone] 02/24/2014, 2:28 PM

## 2014-02-24 NOTE — Progress Notes (Signed)
SPORTS MEDICINE AND JOINT REPLACEMENT  Lara Mulch, MD   Carlynn Spry, PA-C [place], [place], [place]  [address]                             [phone]   PROGRESS NOTE  Subjective:  negative for Chest Pain  negative for Shortness of Breath  negative for Nausea/Vomiting   negative for Calf Pain  negative for Bowel Movement   Tolerating Diet: yes         Patient reports pain as 4 on 0-10 scale.    Objective: Vital signs in last 24 hours:   Patient Vitals for the past 24 hrs:  BP Temp Temp src Pulse Resp SpO2  02/24/14 0632 (!) 159/76 mmHg 97.5 F (36.4 C) Oral 88 18 93 %  02/23/14 2229 (!) 143/76 mmHg 98.2 F (36.8 C) Oral 92 18 96 %  02/23/14 1251 (!) 158/89 mmHg 97.3 F (36.3 C) Axillary 97 20 99 %    @flow {1959:LAST@   Intake/Output from previous day:   12/10 0701 - 12/11 0700 In: 2700 [P.O.:300; I.V.:2400] Out: 1800 [Urine:1800]   Intake/Output this shift:   12/11 0701 - 12/11 1900 In: -  Out: 500 [Urine:500]   Intake/Output      12/10 0701 - 12/11 0700 12/11 0701 - 12/12 0700   P.O. 300    I.V. (mL/kg) 2400 (26.5)    Total Intake(mL/kg) 2700 (29.8)    Urine (mL/kg/hr) 1800 (0.8) 500 (1)   Total Output 1800 500   Net +900 -500        Urine Occurrence 2 x    Stool Occurrence 1 x       LABORATORY DATA:  Recent Labs  02/21/14 1359 02/21/14 1835 02/22/14 0620 02/24/14 0405  WBC 7.1 7.5 5.6 5.9  HGB 16.7* 15.3* 13.2 12.9  HCT 46.8* 43.5 39.2 39.0  PLT 230 191 153 164    Recent Labs  02/21/14 1359 02/21/14 1835 02/22/14 0620 02/23/14 0418 02/24/14 0405  NA 143  --  137 144 140  K 3.3*  --  2.9* 3.2* 3.1*  CL 98  --  101 107 104  CO2 22  --  25 27 26   BUN 7  --  5* 5* 5*  CREATININE 0.72 0.74 0.75 0.80 0.81  GLUCOSE 137*  --  112* 109* 107*  CALCIUM 10.1  --  8.2* 8.4 8.5   Lab Results  Component Value Date   INR 1.24 01/18/2014   INR 1.16 12/29/2013   INR 0.97 09/20/2009    Examination:  General appearance:  alert, cooperative and no distress Extremities: Homans sign is negative, no sign of DVT  Wound Exam: clean, dry, intact   Drainage:  None: wound tissue dry  Motor Exam: EHL and FHL Intact  Sensory Exam: Deep Peroneal normal   Assessment:          ADDITIONAL DIAGNOSIS:  Active Problems:   Right knee pain   Altered mental status   SIRS (systemic inflammatory response syndrome)   Effusion of right knee   Alcohol dependence   Substance induced mood disorder   Hepatic encephalopathy     Plan: Physical Therapy as ordered Weight Bearing as Tolerated (WBAT)  Patients knee examined by ortho and inflammatory markers normal.  Dr Ronnie Derby is signing off.  Will follow patient as outpatient.  Does not have reason to suspect infection of replacement.  Spoke with family about  knee.  Okay to D/C by ortho          Keath Matera 02/24/2014, 12:38 PM

## 2014-02-27 LAB — CULTURE, BLOOD (ROUTINE X 2)
CULTURE: NO GROWTH
Culture: NO GROWTH

## 2014-03-13 ENCOUNTER — Encounter (HOSPITAL_COMMUNITY): Payer: Self-pay | Admitting: Emergency Medicine

## 2014-03-13 ENCOUNTER — Inpatient Hospital Stay (HOSPITAL_COMMUNITY)
Admission: EM | Admit: 2014-03-13 | Discharge: 2014-03-15 | DRG: 689 | Disposition: A | Discharge status: Skilled Nursing Facility | Payer: Medicare Other | Attending: Internal Medicine | Admitting: Internal Medicine

## 2014-03-13 ENCOUNTER — Emergency Department (HOSPITAL_COMMUNITY): Payer: Medicare Other

## 2014-03-13 DIAGNOSIS — Z96659 Presence of unspecified artificial knee joint: Secondary | ICD-10-CM | POA: Diagnosis present

## 2014-03-13 DIAGNOSIS — I10 Essential (primary) hypertension: Secondary | ICD-10-CM | POA: Diagnosis present

## 2014-03-13 DIAGNOSIS — Z79899 Other long term (current) drug therapy: Secondary | ICD-10-CM

## 2014-03-13 DIAGNOSIS — N390 Urinary tract infection, site not specified: Secondary | ICD-10-CM | POA: Diagnosis present

## 2014-03-13 DIAGNOSIS — R296 Repeated falls: Secondary | ICD-10-CM | POA: Diagnosis present

## 2014-03-13 DIAGNOSIS — E876 Hypokalemia: Secondary | ICD-10-CM | POA: Diagnosis present

## 2014-03-13 DIAGNOSIS — R4182 Altered mental status, unspecified: Secondary | ICD-10-CM

## 2014-03-13 DIAGNOSIS — G9340 Encephalopathy, unspecified: Secondary | ICD-10-CM | POA: Diagnosis present

## 2014-03-13 DIAGNOSIS — E869 Volume depletion, unspecified: Secondary | ICD-10-CM | POA: Diagnosis present

## 2014-03-13 DIAGNOSIS — F329 Major depressive disorder, single episode, unspecified: Secondary | ICD-10-CM | POA: Diagnosis present

## 2014-03-13 DIAGNOSIS — R197 Diarrhea, unspecified: Secondary | ICD-10-CM

## 2014-03-13 DIAGNOSIS — R269 Unspecified abnormalities of gait and mobility: Secondary | ICD-10-CM | POA: Diagnosis present

## 2014-03-13 DIAGNOSIS — Z87891 Personal history of nicotine dependence: Secondary | ICD-10-CM

## 2014-03-13 DIAGNOSIS — F1021 Alcohol dependence, in remission: Secondary | ICD-10-CM | POA: Diagnosis present

## 2014-03-13 DIAGNOSIS — K219 Gastro-esophageal reflux disease without esophagitis: Secondary | ICD-10-CM | POA: Diagnosis present

## 2014-03-13 DIAGNOSIS — W19XXXA Unspecified fall, initial encounter: Secondary | ICD-10-CM

## 2014-03-13 DIAGNOSIS — M25461 Effusion, right knee: Secondary | ICD-10-CM | POA: Diagnosis present

## 2014-03-13 DIAGNOSIS — R410 Disorientation, unspecified: Secondary | ICD-10-CM | POA: Diagnosis present

## 2014-03-13 HISTORY — DX: Encephalopathy, unspecified: G93.40

## 2014-03-13 LAB — CBC WITH DIFFERENTIAL/PLATELET
Basophils Absolute: 0 10*3/uL (ref 0.0–0.1)
Basophils Relative: 0 % (ref 0–1)
Eosinophils Absolute: 0 10*3/uL (ref 0.0–0.7)
Eosinophils Relative: 0 % (ref 0–5)
HEMATOCRIT: 44.9 % (ref 36.0–46.0)
Hemoglobin: 15.4 g/dL — ABNORMAL HIGH (ref 12.0–15.0)
LYMPHS ABS: 2.2 10*3/uL (ref 0.7–4.0)
LYMPHS PCT: 25 % (ref 12–46)
MCH: 30.5 pg (ref 26.0–34.0)
MCHC: 34.3 g/dL (ref 30.0–36.0)
MCV: 88.9 fL (ref 78.0–100.0)
Monocytes Absolute: 0.7 10*3/uL (ref 0.1–1.0)
Monocytes Relative: 8 % (ref 3–12)
NEUTROS ABS: 5.9 10*3/uL (ref 1.7–7.7)
Neutrophils Relative %: 67 % (ref 43–77)
PLATELETS: 221 10*3/uL (ref 150–400)
RBC: 5.05 MIL/uL (ref 3.87–5.11)
RDW: 13.9 % (ref 11.5–15.5)
WBC: 8.8 10*3/uL (ref 4.0–10.5)

## 2014-03-13 LAB — COMPREHENSIVE METABOLIC PANEL
ALBUMIN: 4.2 g/dL (ref 3.5–5.2)
ALT: 14 U/L (ref 0–35)
ANION GAP: 13 (ref 5–15)
AST: 29 U/L (ref 0–37)
Alkaline Phosphatase: 115 U/L (ref 39–117)
CHLORIDE: 103 meq/L (ref 96–112)
CO2: 26 mmol/L (ref 19–32)
Calcium: 9.4 mg/dL (ref 8.4–10.5)
Creatinine, Ser: 0.75 mg/dL (ref 0.50–1.10)
GFR calc Af Amer: >90 mL/min (ref >90)
GFR calc non Af Amer: 83 mL/min — ABNORMAL LOW (ref >90)
Glucose, Bld: 129 mg/dL — ABNORMAL HIGH (ref 70–99)
Potassium: 2.7 mmol/L — CL (ref 3.5–5.1)
SODIUM: 142 mmol/L (ref 135–145)
TOTAL PROTEIN: 7.7 g/dL (ref 6.0–8.3)
Total Bilirubin: 0.7 mg/dL (ref 0.3–1.2)

## 2014-03-13 LAB — I-STAT CHEM 8, ED
BUN: <3 mg/dL — ABNORMAL LOW (ref 6–23)
CREATININE: 0.6 mg/dL (ref 0.50–1.10)
Calcium, Ion: 1.06 mmol/L — ABNORMAL LOW (ref 1.13–1.30)
Chloride: 102 mEq/L (ref 96–112)
GLUCOSE: 126 mg/dL — AB (ref 70–99)
HCT: 46 % (ref 36.0–46.0)
Hemoglobin: 15.6 g/dL — ABNORMAL HIGH (ref 12.0–15.0)
Potassium: 2.9 mmol/L — ABNORMAL LOW (ref 3.5–5.1)
Sodium: 142 mmol/L (ref 135–145)
TCO2: 24 mmol/L (ref 0–100)

## 2014-03-13 LAB — URINALYSIS, ROUTINE W REFLEX MICROSCOPIC
Bilirubin Urine: NEGATIVE
GLUCOSE, UA: NEGATIVE mg/dL
KETONES UR: NEGATIVE mg/dL
Nitrite: POSITIVE — AB
Protein, ur: NEGATIVE mg/dL
Specific Gravity, Urine: 1.043 — ABNORMAL HIGH (ref 1.005–1.030)
Urobilinogen, UA: 1 mg/dL (ref 0.0–1.0)
pH: 7.5 (ref 5.0–8.0)

## 2014-03-13 LAB — ETHANOL: Alcohol, Ethyl (B): <5 mg/dL (ref 0–9)

## 2014-03-13 LAB — URINE MICROSCOPIC-ADD ON

## 2014-03-13 LAB — AMMONIA: Ammonia: 22 umol/L (ref 11–32)

## 2014-03-13 LAB — LIPASE, BLOOD: LIPASE: 17 U/L (ref 11–59)

## 2014-03-13 LAB — I-STAT CG4 LACTIC ACID, ED: Lactic Acid, Venous: 3.12 mmol/L — ABNORMAL HIGH (ref 0.5–2.2)

## 2014-03-13 MED ORDER — ONDANSETRON HCL 4 MG/2ML IJ SOLN
4.0000 mg | Freq: Once | INTRAMUSCULAR | Status: AC
  Administered 2014-03-13: 4 mg via INTRAVENOUS
  Filled 2014-03-13: qty 2

## 2014-03-13 MED ORDER — LORAZEPAM 2 MG/ML IJ SOLN
1.0000 mg | Freq: Once | INTRAMUSCULAR | Status: AC
  Administered 2014-03-13: 1 mg via INTRAVENOUS
  Filled 2014-03-13: qty 1

## 2014-03-13 MED ORDER — POTASSIUM CHLORIDE 10 MEQ/100ML IV SOLN
10.0000 meq | Freq: Once | INTRAVENOUS | Status: AC
  Administered 2014-03-13: 10 meq via INTRAVENOUS
  Filled 2014-03-13: qty 100

## 2014-03-13 MED ORDER — DEXTROSE 5 % IV SOLN
1.0000 g | Freq: Once | INTRAVENOUS | Status: AC
  Administered 2014-03-14: 1 g via INTRAVENOUS
  Filled 2014-03-13: qty 10

## 2014-03-13 MED ORDER — ONDANSETRON HCL 4 MG/2ML IJ SOLN: 4.0000 mg | Freq: Once | INTRAMUSCULAR | Status: DC

## 2014-03-13 MED ORDER — SODIUM CHLORIDE 0.9 % IV BOLUS (SEPSIS)
1000.0000 mL | Freq: Once | INTRAVENOUS | Status: AC
  Administered 2014-03-13: 1000 mL via INTRAVENOUS

## 2014-03-13 MED ORDER — HALOPERIDOL LACTATE 5 MG/ML IJ SOLN
2.0000 mg | Freq: Once | INTRAMUSCULAR | Status: AC
  Administered 2014-03-13: 2 mg via INTRAVENOUS
  Filled 2014-03-13: qty 1

## 2014-03-13 MED ORDER — IOHEXOL 300 MG/ML  SOLN
100.0000 mL | Freq: Once | INTRAMUSCULAR | Status: AC | PRN
  Administered 2014-03-13: 100 mL via INTRAVENOUS

## 2014-03-13 MED ORDER — MORPHINE SULFATE 4 MG/ML IJ SOLN
4.0000 mg | Freq: Once | INTRAMUSCULAR | Status: AC
  Administered 2014-03-13: 4 mg via INTRAVENOUS
  Filled 2014-03-13: qty 1

## 2014-03-13 NOTE — ED Notes (Signed)
RN request to wait on vitals due to medication administration.

## 2014-03-13 NOTE — ED Provider Notes (Signed)
CSN: [id number]     Arrival date & time 03/13/14  1702 History   First MD Initiated Contact with Patient 03/13/14 1725     Chief Complaint  Patient presents with  . Altered Mental Status     (Consider location/radiation/quality/duration/timing/severity/associated sxs/prior Treatment) HPI   Patient with recent admission for acute encephalopathy, hepatic encephalopathy, ETOH abuse, R knee effusion 02/21/14-02/24/14.  Right total knee replacement 01/02/2014.  Per daughter, pt came home (lives alone) from rehab facility one week ago. Has been doing well, not sure if she is eating or taking her medications or drinking alcohol.  She became confused last night, her breathing became altered, she had diarrhea "all over the house" and she fell this morning.  Daughter called an ambulance last night and the patient refused transport.  Decided to go today because she felt so bad.  Pt indicates (touches) her head and her bilateral knees when asked about pain and asked for zofran for nausea.  She does not contribute much else to the history, states she wants to go home.    Level V caveat for confusion.    Past Medical History  Diagnosis Date  . Complication of anesthesia     "felt drunk for a week after"  . Hypertension     not being treated with medication  . Palpitations   . Anxiety   . GERD (gastroesophageal reflux disease)   . Arthritis   . Cancer     skin  . Alcohol abuse     ETOH and xanax  . Encephalopathy    Past Surgical History  Procedure Laterality Date  . Tonsillectomy    . Tubal ligation    . 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 Breast surgery      2 breast biopsies  . Eye surgery Bilateral     cataracts  . Colonoscopy    . Total knee arthroplasty Right 01/02/2014    Dr Ronnie Derby  . Total knee arthroplasty Right 01/02/2014    Procedure: TOTAL KNEE ARTHROPLASTY;  Surgeon: [name], MD;  Location: [place];  Service: Orthopedics;  Laterality: Right;  . Joint replacement  Left 09/2009    knee  . Joint replacement Right 01/02/2014   History reviewed. No pertinent family history. History  Substance Use Topics  . Smoking status: Former Research scientist (life sciences)  . Smokeless tobacco: Never Used     Comment: quit smoking many years ago   . Alcohol Use: Yes   OB History    No data available     Review of Systems  Unable to perform ROS: Mental status change      Allergies  Sulfa antibiotics; Ciprofloxacin; and Motrin  Home Medications   Prior to Admission medications   Medication Sig Start Date End Date Taking? Authorizing Provider  acetaminophen (TYLENOL) 500 MG tablet Take 1,000 mg by mouth daily as needed for mild pain.    Historical Provider, MD  citalopram (CELEXA) 20 MG tablet Take 1 tablet (20 mg total) by mouth at bedtime. 02/24/14   [name], MD  furosemide (LASIX) 20 MG tablet Take 20 mg by mouth daily as needed for edema.  11/01/13   Historical Provider, MD  gabapentin (NEURONTIN) 100 MG capsule Take 300 mg by mouth at bedtime.  10/31/13   Historical Provider, MD  hydrOXYzine (ATARAX/VISTARIL) 25 MG tablet Take 50 mg by mouth at bedtime.  08/12/13   Historical Provider, MD  loratadine (CLARITIN) 10 MG tablet Take 10 mg  by mouth 4 (four) times daily as needed for itching.    Historical Provider, MD  methocarbamol (ROBAXIN) 500 MG tablet Take 1-2 tablets (500-1,000 mg total) by mouth every 6 (six) hours as needed for muscle spasms. Patient not taking: Reported on 02/21/2014 01/05/14   Carlynn Spry, PA-C  ondansetron (ZOFRAN-ODT) 4 MG disintegrating tablet Take 4 mg by mouth every 6 (six) hours as needed for nausea.  10/31/13   Historical Provider, MD  oxyCODONE (OXY IR/ROXICODONE) 5 MG immediate release tablet Take 1 tablet (5 mg total) by mouth every 6 (six) hours as needed for moderate pain. 02/24/14   [name], MD  Polyethyl Glycol-Propyl Glycol (SYSTANE OP) Apply 1 drop to eye 3 (three) times daily as needed (dry eyes).    Historical Provider, MD  polyethylene  glycol (MIRALAX / GLYCOLAX) packet Take 17 g by mouth daily as needed for moderate constipation.     Historical Provider, MD  potassium chloride SA (K-DUR,KLOR-CON) 20 MEQ tablet Take 1 tablet (20 mEq total) by mouth 2 (two) times daily. 01/20/14   Carlynn Spry, PA-C  senna-docusate (SENOKOT-S) 8.6-50 MG per tablet Take 2 tablets by mouth at bedtime.    Historical Provider, MD  traZODone (DESYREL) 50 MG tablet Take 1 tablet (50 mg total) by mouth at bedtime. 02/24/14   [name], MD  zolpidem (AMBIEN) 10 MG tablet Take 10 mg by mouth at bedtime as needed for sleep.  02/16/14   Historical Provider, MD   BP 164/105 mmHg  Pulse 105  Temp(Src) 98.2 F (36.8 C)  Resp 22  SpO2 100% Physical Exam  Constitutional: She appears well-developed and well-nourished. No distress.  HENT:  Head: Normocephalic and atraumatic.  Neck: Neck supple.  Cardiovascular: Normal rate and regular rhythm.   Pulmonary/Chest: Effort normal and breath sounds normal. No respiratory distress. She has no wheezes. She has no rales.  Abdominal: Soft. She exhibits no distension. There is generalized tenderness. There is no rebound and no guarding.  Musculoskeletal:       Legs: Bilateral knees no erythema, edema, warmth, discharge.   Neurological: She is alert.  Oriented to self.  States she is at [facility] and then "the hospital that is not [facility]" in [place].  Thinks it is sometime "before Christmas"  She is unable to tell me the year.    Skin: She is not diaphoretic.  Psychiatric: Her mood appears anxious.  Nursing note and vitals reviewed.   ED Course  Procedures (including critical care time) Labs Review Labs Reviewed  COMPREHENSIVE METABOLIC PANEL - Abnormal; Notable for the following:    Potassium 2.7 (*)    Glucose, Bld 129 (*)    BUN <5 (*)    GFR calc non Af Amer 83 (*)    All other components within normal limits  CBC WITH DIFFERENTIAL - Abnormal; Notable for the following:    Hemoglobin 15.4 (*)     All other components within normal limits  URINALYSIS, ROUTINE W REFLEX MICROSCOPIC - Abnormal; Notable for the following:    APPearance CLOUDY (*)    Specific Gravity, Urine 1.043 (*)    Hgb urine dipstick TRACE (*)    Nitrite POSITIVE (*)    Leukocytes, UA SMALL (*)    All other components within normal limits  URINE MICROSCOPIC-ADD ON - Abnormal; Notable for the following:    Bacteria, UA MANY (*)    All other components within normal limits  I-STAT CG4 LACTIC ACID, ED - Abnormal; Notable for the  following:    Lactic Acid, Venous 3.12 (*)    All other components within normal limits  I-STAT CHEM 8, ED - Abnormal; Notable for the following:    Potassium 2.9 (*)    BUN <3 (*)    Glucose, Bld 126 (*)    Calcium, Ion 1.06 (*)    Hemoglobin 15.6 (*)    All other components within normal limits  CULTURE, BLOOD (ROUTINE X 2)  CULTURE, BLOOD (ROUTINE X 2)  URINE CULTURE  AMMONIA  LIPASE, BLOOD  ETHANOL  I-STAT CG4 LACTIC ACID, ED  I-STAT CG4 LACTIC ACID, ED  I-STAT CG4 LACTIC ACID, ED    Imaging Review Dg Chest 2 View  03/14/2014   CLINICAL DATA:  Weakness, headache, diarrhea, lethargy  EXAM: CHEST  2 VIEW  COMPARISON:  CT chest dated 02/21/2014  FINDINGS: Mild left basilar opacity, likely atelectasis. No focal consolidation. No pleural effusion or pneumothorax.  Mild cardiomegaly.  Degenerative changes of the visualized thoracolumbar spine.  IMPRESSION: No evidence of acute cardiopulmonary disease.   Electronically Signed   By: Julian Hy M.D.   On: 03/14/2014 00:08   Ct Head Wo Contrast  03/13/2014   CLINICAL DATA:  Patient status post fall.  EXAM: CT HEAD WITHOUT CONTRAST  CT NECK WITHOUT CONTRAST  TECHNIQUE: Contiguous axial images were obtained from the base of the skull through the vertex without contrast. Multidetector CT imaging of the neck was performed using the standard protocol without intravenous contrast.  COMPARISON:  Brain CT 02/21/2014  FINDINGS: CT HEAD  FINDINGS  Re- demonstrated high attenuation material within the left cavernous sinus compatible with previous treatment for carotid cavernous sinus fistula. Periventricular and subcortical white matter hypodensity compatible with chronic small vessel ischemic change. Old right basal ganglia lacune or infarct. No evidence for acute intracranial infarct, hemorrhage, mass lesion or mass effect. Ventricles and sulci are appropriate for patient's age. Mastoid air cells are unremarkable. Calvarium is intact.  CT NECK FINDINGS  Straightening of the normal cervical lordosis with focal kyphosis at the C4-5 level. C5-6 and C6-7 degenerative disc disease prevertebral soft tissues unremarkable. Cranial cervical junction unremarkable. No evidence for acute fracture.  IMPRESSION: No evidence for acute cervical spine fracture.  Focal kyphosis at the C4-5 level may be secondary to patient positioning. Recommend clinical correlation to exclude ligamentous injury.  No acute intracranial process.  Chronic small vessel ischemic change.  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   Electronically Signed   By: Lovey Newcomer M.D.   On: 03/13/2014 21:50   Ct Cervical Spine Wo Contrast  03/13/2014   CLINICAL DATA:  Patient status post fall.  EXAM: CT HEAD WITHOUT CONTRAST  CT NECK WITHOUT CONTRAST  TECHNIQUE: Contiguous axial images were obtained from the base of the skull through the vertex without contrast. Multidetector CT imaging of the neck was performed using the standard protocol without intravenous contrast.  COMPARISON:  Brain CT 02/21/2014  FINDINGS: CT HEAD FINDINGS  Re- demonstrated high attenuation material within the left cavernous sinus compatible with previous treatment for carotid cavernous sinus fistula. Periventricular and subcortical white matter hypodensity compatible with chronic small vessel ischemic change. Old right basal ganglia lacune or infarct. No evidence for acute intracranial infarct,  hemorrhage, mass lesion or mass effect. Ventricles and sulci are appropriate for patient's age. Mastoid air cells are unremarkable. Calvarium is intact.  CT NECK FINDINGS  Straightening of the normal cervical lordosis with focal kyphosis at the C4-5 level. C5-6 and  C6-7 degenerative disc disease prevertebral soft tissues unremarkable. Cranial cervical junction unremarkable. No evidence for acute fracture.  IMPRESSION: No evidence for acute cervical spine fracture.  Focal kyphosis at the C4-5 level may be secondary to patient positioning. Recommend clinical correlation to exclude ligamentous injury.  No acute intracranial process.  Chronic small vessel ischemic change.  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   Electronically Signed   By: Lovey Newcomer M.D.   On: 03/13/2014 21:50   Ct Abdomen Pelvis W Contrast  03/13/2014   CLINICAL DATA:  Fall, lower abdominal pain  EXAM: CT ABDOMEN AND PELVIS WITH CONTRAST  TECHNIQUE: Multidetector CT imaging of the abdomen and pelvis was performed using the standard protocol following bolus administration of intravenous contrast.  CONTRAST:  120mL OMNIPAQUE IOHEXOL 300 MG/ML  SOLN  COMPARISON:  MRI abdomen dated 06/29/2012  FINDINGS: Lower chest:  Lung bases are clear.  Old right posterior rib deformities.  Hepatobiliary: Scattered hepatic cysts. Stable 13 mm enhancing lesion in the anterior segment right hepatic dome (series 2/image 16), chronic, likely reflecting an atypical hemangioma or focal nodular hyperplasia when correlating with prior MRI.  Gallbladder is notable for fundal adenomyomatosis (series 2/ image 22). No intrahepatic or extrahepatic ductal dilatation.  Pancreas: Within normal limits.  Spleen: Within normal limits.  Adrenals/Urinary Tract: Adrenal glands are unremarkable.  Kidneys are within normal limits.  No hydronephrosis.  Bladder is unremarkable.  Stomach/Bowel: Stomach is unremarkable.  No evidence of bowel obstruction.  Normal  appendix.  Extensive sigmoid diverticulosis, without evidence of diverticulitis.  Vascular/Lymphatic: Atherosclerotic calcifications of the abdominal aorta and branch vessels.  No suspicious abdominopelvic lymphadenopathy.  Reproductive: Uterus is unremarkable.  Bilateral ovaries are within normal limits.  Other: No abdominopelvic ascites.  Small fat containing right inguinal hernia.  Musculoskeletal: Degenerative changes of the visualized thoracolumbar spine.  IMPRESSION: No evidence of bowel obstruction.  Normal appendix.  Extensive sigmoid diverticulosis, without evidence of diverticulitis.  No CT findings to account for the patient's low abdominal pain.  Additional stable ancillary findings as above.   Electronically Signed   By: Julian Hy M.D.   On: 03/13/2014 22:31   Dg Knee Complete 4 Views Right  03/13/2014   CLINICAL DATA:  Pain following fall  EXAM: RIGHT KNEE - COMPLETE 4+ VIEW  COMPARISON:  February 21, 2014  FINDINGS: Frontal, lateral, and bilateral oblique views were obtained. Patient is status post total knee replacement with the prosthetic components appearing well-seated. No fracture or dislocation. No joint effusion. No erosive change. Bones are osteoporotic.  IMPRESSION: Prosthetic components appear well seated. No fracture or dislocation. Bones appear somewhat osteoporotic.   Electronically Signed   By: Lowella Grip M.D.   On: 03/13/2014 19:50     EKG Interpretation None        Date: 03/14/2014  Rate: 105  Rhythm: sinus tachycardia  QRS Axis: left  Intervals: QT prolonged  ST/T Wave abnormalities: nonspecific ST/T changes  Conduction Disutrbances:incomplete RBBB and LAFB  Narrative Interpretation:   Old EKG Reviewed: none available    5:55 PM Dr Mingo Amber made aware of and will see patient.  9:42 PM Abdomen soft, tender over right side, no guarding or rebound.    MDM   Final diagnoses:  Fall  Altered mental status, unspecified altered mental status type    UTI (lower urinary tract infection)  Hypokalemia   Pt with recent admission for acute hepatic encephalopathy, alcohol abuse 12/8-12/11, went to rehab facility and home 1 week  ago.  Per daughter pt cannot take care of herself at home.  Became acute confused last night, had diarrhea, breathing more heavily ,fell once.  Found to be hypokalemic, lactic acid elevated, improved with IVF, found to have UTI, urine sent for culture.  CT head, c-spine unremarkable, CXR negative, Knee xray negative - clinically no e/o septic joint.  Rocephin given for UTI.  IV potassium given.  Pt admitted to [facility], Dr Hal Hope accepting.      [place], PA-C 03/14/14 2182  Evelina Bucy, MD 03/14/14 [phone]

## 2014-03-13 NOTE — ED Notes (Signed)
Decreased POTASSIUM CHLORIDE to 75 mL/hr.

## 2014-03-13 NOTE — ED Notes (Signed)
Pt is in radiology. Introduced myself to patients family but will come back to complete orders.

## 2014-03-13 NOTE — ED Notes (Signed)
Provided patient a cup of ice water.  

## 2014-03-13 NOTE — ED Notes (Signed)
Ramond Marrow, RN assisted patient with bedpan. Pt urinated in bedpan. Di Kindle, RN assisted with changing patients sheets and incontinence pad. Pt tolerated her head down lower than usual. Pt is currently in CT.

## 2014-03-13 NOTE — ED Notes (Signed)
Daughter reports pt had knee replacement on 12/19 and has not been the same since. Pt has been confused. In triage pt stated that there feet on her. Today pt has been complaining of HA, diarrhea and nausea. Pt has a hx of encephalopathy 2 weeks ago which resolved with lactulose. R knee more swollen than L side, but no redness noted. Pt also said she fell last night.

## 2014-03-14 ENCOUNTER — Encounter (HOSPITAL_COMMUNITY): Payer: Self-pay | Admitting: Internal Medicine

## 2014-03-14 DIAGNOSIS — F329 Major depressive disorder, single episode, unspecified: Secondary | ICD-10-CM | POA: Diagnosis present

## 2014-03-14 DIAGNOSIS — R410 Disorientation, unspecified: Secondary | ICD-10-CM | POA: Diagnosis present

## 2014-03-14 DIAGNOSIS — F1021 Alcohol dependence, in remission: Secondary | ICD-10-CM | POA: Diagnosis present

## 2014-03-14 DIAGNOSIS — Z96659 Presence of unspecified artificial knee joint: Secondary | ICD-10-CM | POA: Diagnosis present

## 2014-03-14 DIAGNOSIS — I10 Essential (primary) hypertension: Secondary | ICD-10-CM | POA: Diagnosis present

## 2014-03-14 DIAGNOSIS — N390 Urinary tract infection, site not specified: Principal | ICD-10-CM

## 2014-03-14 DIAGNOSIS — Z79899 Other long term (current) drug therapy: Secondary | ICD-10-CM | POA: Diagnosis not present

## 2014-03-14 DIAGNOSIS — M25461 Effusion, right knee: Secondary | ICD-10-CM | POA: Diagnosis present

## 2014-03-14 DIAGNOSIS — Z87891 Personal history of nicotine dependence: Secondary | ICD-10-CM | POA: Diagnosis not present

## 2014-03-14 DIAGNOSIS — E876 Hypokalemia: Secondary | ICD-10-CM | POA: Diagnosis present

## 2014-03-14 DIAGNOSIS — R269 Unspecified abnormalities of gait and mobility: Secondary | ICD-10-CM | POA: Diagnosis present

## 2014-03-14 DIAGNOSIS — G9340 Encephalopathy, unspecified: Secondary | ICD-10-CM | POA: Diagnosis present

## 2014-03-14 DIAGNOSIS — K219 Gastro-esophageal reflux disease without esophagitis: Secondary | ICD-10-CM | POA: Diagnosis present

## 2014-03-14 DIAGNOSIS — R197 Diarrhea, unspecified: Secondary | ICD-10-CM

## 2014-03-14 DIAGNOSIS — R296 Repeated falls: Secondary | ICD-10-CM | POA: Diagnosis present

## 2014-03-14 DIAGNOSIS — E869 Volume depletion, unspecified: Secondary | ICD-10-CM | POA: Diagnosis present

## 2014-03-14 LAB — COMPREHENSIVE METABOLIC PANEL
ALT: 12 U/L (ref 0–35)
AST: 25 U/L (ref 0–37)
Albumin: 4 g/dL (ref 3.5–5.2)
Alkaline Phosphatase: 101 U/L (ref 39–117)
Anion gap: 8 (ref 5–15)
BILIRUBIN TOTAL: 0.8 mg/dL (ref 0.3–1.2)
BUN: <5 mg/dL — ABNORMAL LOW (ref 6–23)
CHLORIDE: 105 meq/L (ref 96–112)
CO2: 29 mmol/L (ref 19–32)
Calcium: 9 mg/dL (ref 8.4–10.5)
Creatinine, Ser: 0.64 mg/dL (ref 0.50–1.10)
GFR calc Af Amer: >90 mL/min (ref >90)
GFR calc non Af Amer: 87 mL/min — ABNORMAL LOW (ref >90)
Glucose, Bld: 121 mg/dL — ABNORMAL HIGH (ref 70–99)
Potassium: 2.8 mmol/L — ABNORMAL LOW (ref 3.5–5.1)
SODIUM: 142 mmol/L (ref 135–145)
TOTAL PROTEIN: 6.9 g/dL (ref 6.0–8.3)

## 2014-03-14 LAB — AMMONIA: Ammonia: 17 umol/L (ref 11–32)

## 2014-03-14 LAB — TROPONIN I: Troponin I: <0.03 ng/mL (ref <0.031)

## 2014-03-14 LAB — D-DIMER, QUANTITATIVE: D-Dimer, Quant: 1.2 ug/mL-FEU — ABNORMAL HIGH (ref 0.00–0.48)

## 2014-03-14 LAB — CK: CK TOTAL: 48 U/L (ref 7–177)

## 2014-03-14 LAB — CLOSTRIDIUM DIFFICILE BY PCR: Toxigenic C. Difficile by PCR: NEGATIVE

## 2014-03-14 LAB — MAGNESIUM: Magnesium: 1.6 mg/dL (ref 1.5–2.5)

## 2014-03-14 LAB — I-STAT CG4 LACTIC ACID, ED: LACTIC ACID, VENOUS: 1.27 mmol/L (ref 0.5–2.2)

## 2014-03-14 MED ORDER — ENOXAPARIN SODIUM 40 MG/0.4ML ~~LOC~~ SOLN
40.0000 mg | SUBCUTANEOUS | Status: DC
  Administered 2014-03-14 – 2014-03-15 (×2): 40 mg via SUBCUTANEOUS
  Filled 2014-03-14 (×2): qty 0.4

## 2014-03-14 MED ORDER — SACCHAROMYCES BOULARDII 250 MG PO CAPS
250.0000 mg | ORAL_CAPSULE | Freq: Two times a day (BID) | ORAL | Status: DC
  Administered 2014-03-14 – 2014-03-15 (×3): 250 mg via ORAL
  Filled 2014-03-14 (×4): qty 1

## 2014-03-14 MED ORDER — SODIUM CHLORIDE 0.9 % IJ SOLN
3.0000 mL | Freq: Two times a day (BID) | INTRAMUSCULAR | Status: DC
  Administered 2014-03-14 (×3): 3 mL via INTRAVENOUS

## 2014-03-14 MED ORDER — LORATADINE 10 MG PO TABS
10.0000 mg | ORAL_TABLET | Freq: Every day | ORAL | Status: DC | PRN
  Filled 2014-03-14: qty 1

## 2014-03-14 MED ORDER — ONDANSETRON HCL 4 MG/2ML IJ SOLN
4.0000 mg | Freq: Four times a day (QID) | INTRAMUSCULAR | Status: DC | PRN
  Administered 2014-03-15 (×2): 4 mg via INTRAVENOUS
  Filled 2014-03-14 (×2): qty 2

## 2014-03-14 MED ORDER — POTASSIUM CHLORIDE 10 MEQ/100ML IV SOLN
10.0000 meq | INTRAVENOUS | Status: AC
  Administered 2014-03-14 (×2): 10 meq via INTRAVENOUS
  Filled 2014-03-14 (×2): qty 100

## 2014-03-14 MED ORDER — POLYVINYL ALCOHOL 1.4 % OP SOLN
1.0000 [drp] | OPHTHALMIC | Status: DC | PRN
  Filled 2014-03-14: qty 15

## 2014-03-14 MED ORDER — SODIUM CHLORIDE 0.9 % IV SOLN
INTRAVENOUS | Status: AC
  Administered 2014-03-14: 06:00:00 via INTRAVENOUS
  Filled 2014-03-14: qty 1000

## 2014-03-14 MED ORDER — ONDANSETRON HCL 4 MG PO TABS
4.0000 mg | ORAL_TABLET | Freq: Four times a day (QID) | ORAL | Status: DC | PRN
  Administered 2014-03-14 (×2): 4 mg via ORAL
  Filled 2014-03-14 (×2): qty 1

## 2014-03-14 MED ORDER — SODIUM CHLORIDE 0.9 % IJ SOLN
3.0000 mL | Freq: Two times a day (BID) | INTRAMUSCULAR | Status: DC
  Administered 2014-03-14: 3 mL via INTRAVENOUS

## 2014-03-14 MED ORDER — METHOCARBAMOL 500 MG PO TABS
500.0000 mg | ORAL_TABLET | Freq: Four times a day (QID) | ORAL | Status: DC | PRN
Start: 2014-03-14 — End: 2014-03-15
  Administered 2014-03-14: 1000 mg via ORAL
  Filled 2014-03-14: qty 2

## 2014-03-14 MED ORDER — ONDANSETRON HCL 4 MG/2ML IJ SOLN: 4.0000 mg | Freq: Three times a day (TID) | INTRAMUSCULAR | Status: DC | PRN

## 2014-03-14 MED ORDER — MORPHINE SULFATE 4 MG/ML IJ SOLN
4.0000 mg | Freq: Once | INTRAMUSCULAR | Status: AC
  Administered 2014-03-14: 4 mg via INTRAVENOUS
  Filled 2014-03-14: qty 1

## 2014-03-14 MED ORDER — HYDROXYZINE HCL 50 MG PO TABS
50.0000 mg | ORAL_TABLET | Freq: Every day | ORAL | Status: DC
  Administered 2014-03-14: 50 mg via ORAL
  Filled 2014-03-14 (×2): qty 1

## 2014-03-14 MED ORDER — ZOLPIDEM TARTRATE 5 MG PO TABS
5.0000 mg | ORAL_TABLET | Freq: Every evening | ORAL | Status: DC | PRN
  Administered 2014-03-14: 5 mg via ORAL
  Filled 2014-03-14: qty 1

## 2014-03-14 MED ORDER — HYDRALAZINE HCL 20 MG/ML IJ SOLN
10.0000 mg | INTRAMUSCULAR | Status: DC | PRN
  Administered 2014-03-15: 10 mg via INTRAVENOUS
  Filled 2014-03-14: qty 1

## 2014-03-14 MED ORDER — CITALOPRAM HYDROBROMIDE 20 MG PO TABS
20.0000 mg | ORAL_TABLET | Freq: Every day | ORAL | Status: DC
  Administered 2014-03-14: 20 mg via ORAL
  Filled 2014-03-14 (×2): qty 1

## 2014-03-14 MED ORDER — ENSURE COMPLETE PO LIQD
237.0000 mL | Freq: Two times a day (BID) | ORAL | Status: DC
  Administered 2014-03-14 – 2014-03-15 (×2): 237 mL via ORAL

## 2014-03-14 MED ORDER — MORPHINE SULFATE 4 MG/ML IJ SOLN
4.0000 mg | INTRAMUSCULAR | Status: DC | PRN
  Administered 2014-03-14 (×2): 4 mg via INTRAVENOUS
  Filled 2014-03-14 (×2): qty 1

## 2014-03-14 MED ORDER — ACETAMINOPHEN 325 MG PO TABS: 650.0000 mg | ORAL_TABLET | Freq: Four times a day (QID) | ORAL | Status: DC | PRN

## 2014-03-14 MED ORDER — TRAZODONE HCL 50 MG PO TABS
50.0000 mg | ORAL_TABLET | Freq: Every day | ORAL | Status: DC
  Administered 2014-03-14: 50 mg via ORAL
  Filled 2014-03-14 (×2): qty 1

## 2014-03-14 MED ORDER — ONDANSETRON 4 MG PO TBDP
4.0000 mg | ORAL_TABLET | Freq: Four times a day (QID) | ORAL | Status: DC | PRN
  Filled 2014-03-14: qty 1

## 2014-03-14 MED ORDER — ACETAMINOPHEN 650 MG RE SUPP: 650.0000 mg | Freq: Four times a day (QID) | RECTAL | Status: DC | PRN

## 2014-03-14 MED ORDER — SODIUM CHLORIDE 0.9 % IV SOLN: INTRAVENOUS | Status: DC

## 2014-03-14 MED ORDER — POTASSIUM CHLORIDE IN NACL 20-0.9 MEQ/L-% IV SOLN
INTRAVENOUS | Status: DC
  Administered 2014-03-14: 21:00:00 via INTRAVENOUS
  Administered 2014-03-15: 75 mL/h via INTRAVENOUS
  Filled 2014-03-14 (×3): qty 1000

## 2014-03-14 MED ORDER — GABAPENTIN 300 MG PO CAPS
300.0000 mg | ORAL_CAPSULE | Freq: Every day | ORAL | Status: DC
  Administered 2014-03-14: 300 mg via ORAL
  Filled 2014-03-14 (×2): qty 1

## 2014-03-14 MED ORDER — CEFTRIAXONE SODIUM IN DEXTROSE 20 MG/ML IV SOLN
1.0000 g | INTRAVENOUS | Status: DC
  Administered 2014-03-14: 1 g via INTRAVENOUS
  Filled 2014-03-14: qty 50

## 2014-03-14 MED ORDER — OXYCODONE HCL 5 MG PO TABS
5.0000 mg | ORAL_TABLET | Freq: Four times a day (QID) | ORAL | Status: DC | PRN
  Administered 2014-03-14 – 2014-03-15 (×3): 5 mg via ORAL
  Filled 2014-03-14 (×3): qty 1

## 2014-03-14 MED ORDER — POTASSIUM CHLORIDE CRYS ER 20 MEQ PO TBCR
40.0000 meq | EXTENDED_RELEASE_TABLET | Freq: Once | ORAL | Status: AC
  Administered 2014-03-14: 40 meq via ORAL
  Filled 2014-03-14: qty 2

## 2014-03-14 MED ORDER — THIAMINE HCL 100 MG/ML IJ SOLN
100.0000 mg | Freq: Every day | INTRAMUSCULAR | Status: DC
  Administered 2014-03-14 – 2014-03-15 (×2): 100 mg via INTRAVENOUS
  Filled 2014-03-14 (×2): qty 1

## 2014-03-14 NOTE — Progress Notes (Signed)
PROGRESS NOTE  Denise Kelly ZOX:[id number] DOB: [birth year] DOA: 03/13/2014 PCP: [name], D, MD Brief history 73 year old morbidly female , status post knee arthroplasty , recently admitted 01/18/14 for altered mental status, right knee pain, urinary tract infection , presents to the ER with a similar presentation today. Apparently Dr. Ronnie Derby had examined the patient knee in November during this admission, and he did not suspect any infection. Patient was found to have Escherichia coli bacteremia and Escherichia coli in her urine, treated with Rocephin in the hospital and subsequently discharged with amoxicillin of which pt endorsed compliance. The patient was readmitted to [facility]  on 02/21/2014 with acute encephalopathy that was likely multifactorial including elevated ammonia and alcohol withdrawal. The patient was discharged back to her home in stable condition without any antibiotics.   Again she was admitted Assessment/Plan: acute encephalopathy  -likely due to urinary tract infection And volume depletion -gradually improving -Ammonia 17 -02/21/2014 TSH 1.320  -Check RPR, B12, RBC folate -EKG shows sinus rhythm with nonspecific ST changes, unchanged from prior EKGs Diarrhea  -C. difficile negative  -03/13/2014 CT abdomen and pelvis showed diverticulosis with gallbladder fundal adenomyomatosis without any other acute abnormalities  Pyuria  -Concerning for UTI  -continue empiric ceftriaxone pending culture data  Right knee pain/right knee effusion -X-ray of the right knee Prosthetic components appear well seated. No fracture or Dislocation. Hypokalemia -replete -check mag Depression/substance induced mood disorder -02/24/14 psychiatry--> increased Celexa to 20 mg daily, added trazodone at bedtime -Continue current doses  Gait instability/frequent falls  -PT recommends skilled nursing facility  Alcohol abuse  -Patient states that she has not drank in 3-4 weeks    Family Communication:   Daughter updated at beside Disposition Plan:   Home when medically stable      Procedures/Studies: Dg Chest 2 View  03/14/2014   CLINICAL DATA:  Weakness, headache, diarrhea, lethargy  EXAM: CHEST  2 VIEW  COMPARISON:  CT chest dated 02/21/2014  FINDINGS: Mild left basilar opacity, likely atelectasis. No focal consolidation. No pleural effusion or pneumothorax.  Mild cardiomegaly.  Degenerative changes of the visualized thoracolumbar spine.  IMPRESSION: No evidence of acute cardiopulmonary disease.   Electronically Signed   By: Julian Hy M.D.   On: 03/14/2014 00:08   Dg Chest 2 View  02/21/2014   CLINICAL DATA:  Shortness of breath.  EXAM: CHEST  2 VIEW  COMPARISON:  January 17, 2014.  FINDINGS: The heart size and mediastinal contours are within normal limits. Both lungs are clear. No pneumothorax or pleural effusion is noted. The visualized skeletal structures are unremarkable.  IMPRESSION: No acute cardiopulmonary abnormality seen.   Electronically Signed   By: Sabino Dick M.D.   On: 02/21/2014 15:13   Ct Head Wo Contrast  03/13/2014   CLINICAL DATA:  Patient status post fall.  EXAM: CT HEAD WITHOUT CONTRAST  CT NECK WITHOUT CONTRAST  TECHNIQUE: Contiguous axial images were obtained from the base of the skull through the vertex without contrast. Multidetector CT imaging of the neck was performed using the standard protocol without intravenous contrast.  COMPARISON:  Brain CT 02/21/2014  FINDINGS: CT HEAD FINDINGS  Re- demonstrated high attenuation material within the left cavernous sinus compatible with previous treatment for carotid cavernous sinus fistula. Periventricular and subcortical white matter hypodensity compatible with chronic small vessel ischemic change. Old right basal ganglia lacune or infarct. No evidence for acute intracranial infarct, hemorrhage, mass lesion or mass effect. Ventricles  and sulci are appropriate for patient's age. Mastoid air  cells are unremarkable. Calvarium is intact.  CT NECK FINDINGS  Straightening of the normal cervical lordosis with focal kyphosis at the C4-5 level. C5-6 and C6-7 degenerative disc disease prevertebral soft tissues unremarkable. Cranial cervical junction unremarkable. No evidence for acute fracture.  IMPRESSION: No evidence for acute cervical spine fracture.  Focal kyphosis at the C4-5 level may be secondary to patient positioning. Recommend clinical correlation to exclude ligamentous injury.  No acute intracranial process.  Chronic small vessel ischemic change.  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   Electronically Signed   By: Lovey Newcomer M.D.   On: 03/13/2014 21:50   Ct Head Wo Contrast  02/21/2014   CLINICAL DATA:  Increasing confusion. Altered mental status. History of carotid cavernous sinus fistula.  EXAM: CT HEAD WITHOUT CONTRAST  TECHNIQUE: Contiguous axial images were obtained from the base of the skull through the vertex without intravenous contrast.  COMPARISON:  None.  FINDINGS: There is extensive high density material in the region of the left cavernous sinus consistent with including from previous treatment for carotid cavernous sinus fistula. There is a small collection of the dense material in the left temporal fossa laterally. This material obscures some detail.  There is a tiny old lacunar infarct in the right basal ganglia. Brain parenchyma otherwise appears normal other than mild atrophy with secondary slight ventricular dilatation. There is no acute intracranial hemorrhage, infarction, or mass lesion. Osseous structures are normal.  IMPRESSION: No acute abnormality. Previous treatment for left carotid cavernous sinus fistula. Tiny old lacunar infarct in the right basal ganglia.   Electronically Signed   By: Rozetta Nunnery M.D.   On: 02/21/2014 15:45   Ct Angio Chest Pe W/cm &/or Wo Cm  02/21/2014   CLINICAL DATA:  Short of breath  EXAM: CT ANGIOGRAPHY CHEST WITH  CONTRAST  TECHNIQUE: Multidetector CT imaging of the chest was performed using the standard protocol during bolus administration of intravenous contrast. Multiplanar CT image reconstructions and MIPs were obtained to evaluate the vascular anatomy.  CONTRAST:  157mL OMNIPAQUE IOHEXOL 350 MG/ML SOLN  COMPARISON:  CT chest 04/23/2009  FINDINGS: The patient could not hold her breath and the image quality is degraded by significant motion.  Negative for pulmonary emboli. Small emboli could be missed on the study due to motion. Negative for aortic dissection. Mild atherosclerotic disease in the aorta. Coronary artery calcification. Mild cardiac enlargement without pericardial effusion.  Calcified granuloma right upper lobe unchanged from the prior CT. Negative for infiltrate or effusion. Negative for mass or adenopathy.  No acute abnormality in the upper abdomen. No acute thoracic spine abnormality.  Review of the MIP images confirms the above findings.  IMPRESSION: Image quality degraded by motion from breathing  Negative for pulmonary embolism.  No acute abnormality.   Electronically Signed   By: Franchot Gallo M.D.   On: 02/21/2014 15:50   Ct Cervical Spine Wo Contrast  03/13/2014   CLINICAL DATA:  Patient status post fall.  EXAM: CT HEAD WITHOUT CONTRAST  CT NECK WITHOUT CONTRAST  TECHNIQUE: Contiguous axial images were obtained from the base of the skull through the vertex without contrast. Multidetector CT imaging of the neck was performed using the standard protocol without intravenous contrast.  COMPARISON:  Brain CT 02/21/2014  FINDINGS: CT HEAD FINDINGS  Re- demonstrated high attenuation material within the left cavernous sinus compatible with previous treatment for carotid cavernous sinus fistula. Periventricular and  subcortical white matter hypodensity compatible with chronic small vessel ischemic change. Old right basal ganglia lacune or infarct. No evidence for acute intracranial infarct, hemorrhage,  mass lesion or mass effect. Ventricles and sulci are appropriate for patient's age. Mastoid air cells are unremarkable. Calvarium is intact.  CT NECK FINDINGS  Straightening of the normal cervical lordosis with focal kyphosis at the C4-5 level. C5-6 and C6-7 degenerative disc disease prevertebral soft tissues unremarkable. Cranial cervical junction unremarkable. No evidence for acute fracture.  IMPRESSION: No evidence for acute cervical spine fracture.  Focal kyphosis at the C4-5 level may be secondary to patient positioning. Recommend clinical correlation to exclude ligamentous injury.  No acute intracranial process.  Chronic small vessel ischemic change.  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   Electronically Signed   By: Lovey Newcomer M.D.   On: 03/13/2014 21:50   US Abdomen Complete  02/23/2014   CLINICAL DATA:  Hepatic encephalopathy  EXAM: ULTRASOUND ABDOMEN COMPLETE  COMPARISON:  Ultrasound 07/03/2010  FINDINGS: Gallbladder: No gallstones or wall thickening visualized. No sonographic Murphy sign noted.  Common bile duct: Diameter: 9 mm in diameter mild prominent size  Liver: Diffuse increased echogenicity of the liver suspicious for fatty infiltration. There is a cyst in left hepatic lobe measures 1.3 x 1.2 cm.  IVC: No abnormality visualized.  Pancreas: Visualized portion unremarkable.  Spleen: Size and appearance within normal limits.  6.4 cm in length.  Right Kidney: Length: 10.5 cm. Echogenicity within normal limits. No mass or hydronephrosis visualized.  Left Kidney: Length: 12 cm. Echogenicity within normal limits. No mass or hydronephrosis visualized.  Abdominal aorta: No aneurysm visualized. Measures up to 1.7 cm in diameter. No abdominal ascites.  Other findings: None.  IMPRESSION: 1. No gallstones are noted within gallbladder. 2. CBD measures 9 mm in diameter mild prominent in size. No intrahepatic biliary ductal dilatation. 3. Mild increased echogenicity of the liver  suspicious for fatty infiltration. There is a cyst in left hepatic lobe measures 1.3 cm. 4. No hydronephrosis.  No renal calculi. 5. No aortic aneurysm.   Electronically Signed   By: Lahoma Crocker M.D.   On: 02/23/2014 10:40   Ct Abdomen Pelvis W Contrast  03/13/2014   CLINICAL DATA:  Fall, lower abdominal pain  EXAM: CT ABDOMEN AND PELVIS WITH CONTRAST  TECHNIQUE: Multidetector CT imaging of the abdomen and pelvis was performed using the standard protocol following bolus administration of intravenous contrast.  CONTRAST:  133mL OMNIPAQUE IOHEXOL 300 MG/ML  SOLN  COMPARISON:  MRI abdomen dated 06/29/2012  FINDINGS: Lower chest:  Lung bases are clear.  Old right posterior rib deformities.  Hepatobiliary: Scattered hepatic cysts. Stable 13 mm enhancing lesion in the anterior segment right hepatic dome (series 2/image 16), chronic, likely reflecting an atypical hemangioma or focal nodular hyperplasia when correlating with prior MRI.  Gallbladder is notable for fundal adenomyomatosis (series 2/ image 22). No intrahepatic or extrahepatic ductal dilatation.  Pancreas: Within normal limits.  Spleen: Within normal limits.  Adrenals/Urinary Tract: Adrenal glands are unremarkable.  Kidneys are within normal limits.  No hydronephrosis.  Bladder is unremarkable.  Stomach/Bowel: Stomach is unremarkable.  No evidence of bowel obstruction.  Normal appendix.  Extensive sigmoid diverticulosis, without evidence of diverticulitis.  Vascular/Lymphatic: Atherosclerotic calcifications of the abdominal aorta and branch vessels.  No suspicious abdominopelvic lymphadenopathy.  Reproductive: Uterus is unremarkable.  Bilateral ovaries are within normal limits.  Other: No abdominopelvic ascites.  Small fat containing right inguinal hernia.  Musculoskeletal: Degenerative  changes of the visualized thoracolumbar spine.  IMPRESSION: No evidence of bowel obstruction.  Normal appendix.  Extensive sigmoid diverticulosis, without evidence of  diverticulitis.  No CT findings to account for the patient's low abdominal pain.  Additional stable ancillary findings as above.   Electronically Signed   By: Julian Hy M.D.   On: 03/13/2014 22:31   Dg Knee Complete 4 Views Right  03/13/2014   CLINICAL DATA:  Pain following fall  EXAM: RIGHT KNEE - COMPLETE 4+ VIEW  COMPARISON:  February 21, 2014  FINDINGS: Frontal, lateral, and bilateral oblique views were obtained. Patient is status post total knee replacement with the prosthetic components appearing well-seated. No fracture or dislocation. No joint effusion. No erosive change. Bones are osteoporotic.  IMPRESSION: Prosthetic components appear well seated. No fracture or dislocation. Bones appear somewhat osteoporotic.   Electronically Signed   By: Lowella Grip M.D.   On: 03/13/2014 19:50   Dg Knee Complete 4 Views Right  02/21/2014   CLINICAL DATA:  Status post fall after total knee replacement on 11/19, redness/swelling  EXAM: RIGHT KNEE - COMPLETE 4+ VIEW  COMPARISON:  01/03/2014  FINDINGS: Right total knee arthroplasty in satisfactory position. No evidence of hardware complication or loosening.  No fracture or dislocation is seen.  Moderate suprapatellar knee joint effusion.  IMPRESSION: Right total knee arthroplasty in satisfactory position. No evidence of complication.  Moderate suprapatellar knee joint effusion.   Electronically Signed   By: Julian Hy M.D.   On: 02/21/2014 20:00         Subjective:   Objective: Filed Vitals:   03/14/14 0116 03/14/14 0148 03/14/14 0517 03/14/14 1417  BP: 144/86 144/88 159/91 148/81  Pulse: 102 101 98 100  Temp: 98.5 F (36.9 C) 98.4 F (36.9 C) 97.9 F (36.6 C) 98.3 F (36.8 C)  TempSrc: Oral Oral Oral Oral  Resp: 20 18 20 18   Height:  5\' 8"  (1.727 m)    Weight:  90.719 kg (200 lb)    SpO2: 98% 94% 96% 96%    Intake/Output Summary (Last 24 hours) at 03/14/14 1654 Last data filed at 03/14/14 1500  Gross per 24 hour    Intake   1680 ml  Output    800 ml  Net    880 ml   Weight change:  Exam:   General:  Pt is alert, follows commands appropriately, not in acute distress  HEENT: No icterus, No thrush, No neck mass, [place]/AT  Cardiovascular: RRR, S1/S2, no rubs, no gallops  Respiratory: CTA bilaterally, no wheezing, no crackles, no rhonchi  Abdomen: Soft/+BS, non tender, non distended, no guarding  Extremities: No edema, No lymphangitis, No petechiae, No rashes, no synovitis  Data Reviewed: Basic Metabolic Panel:  Recent Labs Lab 03/13/14 1749 03/13/14 2213 03/14/14 0314  NA 142 142 142  K 2.7* 2.9* 2.8*  CL 103 102 105  CO2 26  --  29  GLUCOSE 129* 126* 121*  BUN <5* <3* <5*  CREATININE 0.75 0.60 0.64  CALCIUM 9.4  --  9.0  MG  --   --  1.6   Liver Function Tests:  Recent Labs Lab 03/13/14 1749 03/14/14 0314  AST 29 25  ALT 14 12  ALKPHOS 115 101  BILITOT 0.7 0.8  PROT 7.7 6.9  ALBUMIN 4.2 4.0    Recent Labs Lab 03/13/14 1749  LIPASE 17    Recent Labs Lab 03/13/14 1749 03/14/14 0520  AMMONIA 22 17   CBC:  Recent Labs  Lab 03/13/14 1749 03/13/14 2213  WBC 8.8  --   NEUTROABS 5.9  --   HGB 15.4* 15.6*  HCT 44.9 46.0  MCV 88.9  --   PLT 221  --    Cardiac Enzymes:  Recent Labs Lab 03/14/14 0520  CKTOTAL 24  TROPONINI <0.03   BNP: Invalid input(s): POCBNP CBG: No results for input(s): GLUCAP in the last 168 hours.  Recent Results (from the past 240 hour(s))  Blood culture (routine x 2)     Status: None (Preliminary result)   Collection Time: 03/13/14  5:49 PM  Result Value Ref Range Status   Specimen Description BLOOD RIGHT ARM  Final   Special Requests   Final    BOTTLES DRAWN AEROBIC AND ANAEROBIC 3CC BOTH BOTTLES   Culture   Final           BLOOD CULTURE RECEIVED NO GROWTH TO DATE CULTURE WILL BE HELD FOR 5 DAYS BEFORE ISSUING A FINAL NEGATIVE REPORT Performed at [facility]    Report Status PENDING  Incomplete  Clostridium  Difficile by PCR     Status: None   Collection Time: 03/14/14 10:28 AM  Result Value Ref Range Status   C difficile by pcr NEGATIVE NEGATIVE Final    Comment: Performed at [facility]     Scheduled Meds: . cefTRIAXone (ROCEPHIN)  IV  1 g Intravenous Q24H  . citalopram  20 mg Oral QHS  . enoxaparin (LOVENOX) injection  40 mg Subcutaneous Q24H  . feeding supplement (ENSURE COMPLETE)  237 mL Oral BID BM  . gabapentin  300 mg Oral QHS  . hydrOXYzine  50 mg Oral QHS  . saccharomyces boulardii  250 mg Oral BID  . sodium chloride  3 mL Intravenous Q12H  . sodium chloride  3 mL Intravenous Q12H  . thiamine IV  100 mg Intravenous Daily  . traZODone  50 mg Oral QHS   Continuous Infusions: . sodium chloride 0.9 % 1,000 mL with potassium chloride 20 mEq infusion 75 mL/hr at 03/14/14 0540     Brenn Deziel, DO  [facility] Pager [phone]  If 7PM-7AM, please contact night-coverage [email or web address] Password [email or web address] 03/14/2014, 4:54 PM   LOS: 1 day

## 2014-03-14 NOTE — ED Notes (Signed)
Lactic given to Dr. Mingo Amber.

## 2014-03-14 NOTE — Progress Notes (Signed)
Clinical Social Work Department BRIEF PSYCHOSOCIAL ASSESSMENT 03/14/2014  Patient:  Denise Kelly, Denise Kelly     Account Number:  [id number]     [place] date:  03/13/2014  Clinical Social Worker:  Renold Genta  Date/Time:  03/14/2014 05:00 PM  Referred by:  Physician  Date Referred:  03/14/2014 Referred for  SNF Placement   Other Referral:   Interview type:  Patient Other interview type:   and daughter, Baldo Ash at bedside    PSYCHOSOCIAL DATA Living Status:  ALONE Admitted from facility:   Level of care:   Primary support name:  Dalia Heading (daughter) ph#: [phone] Primary support relationship to patient:  CHILD, ADULT Degree of support available:   good    CURRENT CONCERNS Current Concerns  Post-Acute Placement   Other Concerns:    SOCIAL WORK ASSESSMENT / PLAN CSW received consult for SNF placement.   Assessment/plan status:  Information/Referral to [employer] Other assessment/ plan:   Information/referral to community resources:   CSW completed FL2 and faxed information out to [facility].    PATIENT'S/FAMILY'S RESPONSE TO PLAN OF CARE: CSW familiar with patient & daughter from previous hospitalization, was discharged to [place] 12/11 - 12/23. Per daughter, patient was there maybe 2 weeks and discharged home. Patient requesting to return to [facility] at discharge, CSW spoke with [facility] @ [facility] & confirmed that they would have a bed for her at discharge.       Raynaldo Opitz, [facility] Clinical Social Worker cell #: [phone]

## 2014-03-14 NOTE — Evaluation (Signed)
Physical Therapy Evaluation Patient Details Name: ISABELLE MATT MRN: [id number] DOB: [birth year] Today's Date: 03/14/2014   History of Present Illness   NADRA HRITZ is a 73 y.o. female with history of alcoholism who was recently admitted for encephalopathy and at that time patient's ammonia level was minimally elevated and improved with lactulose was discharged to rehabilitation and was back home 3 days ago. As per patient's daughter patient night before this had diarrhea and was confused. Today patient also had a fall but did not lose consciousness. Patient appeared confused. Patient was brought to the ER and in the ER patient had CT head neck and abdomen which were unremarkable  Clinical Impression  Pt admitted with acute encephalopathy and presenting with functional mobility limitations 2* fatigue, generalized weakness, residual R knee pain, and ambulatory instability.  Pt does not have 24/7 assist at home and may require follow up rehab at SNF level prior to return home dependent on acute stay progress    Follow Up Recommendations SNF;Supervision for mobility/OOB    Equipment Recommendations  None recommended by PT    Recommendations for Other Services OT consult     Precautions / Restrictions Precautions Precautions: Fall Restrictions Weight Bearing Restrictions: No      Mobility  Bed Mobility Overal bed mobility: Needs Assistance Bed Mobility: Supine to Sit     Supine to sit: Min assist     General bed mobility comments: utilized pad to assist to EOB  Transfers Overall transfer level: Needs assistance Equipment used: Rolling walker (2 wheeled) Transfers: Sit to/from Stand Sit to Stand: Min assist         General transfer comment: verbal cues for hand placement  Ambulation/Gait Ambulation/Gait assistance: Min assist;Min guard Ambulation Distance (Feet): 180 Feet Assistive device: Rolling walker (2 wheeled) Gait Pattern/deviations: Step-through  pattern;Decreased step length - right;Decreased step length - left;Shuffle Gait velocity: decr Gait velocity interpretation: Below normal speed for age/gender General Gait Details: cues for posture and position from RW  Stairs            Wheelchair Mobility    Modified Rankin (Stroke Patients Only)       Balance                                             Pertinent Vitals/Pain Pain Assessment: Faces Faces Pain Scale: Hurts a little bit Pain Location: R knee  Pain Descriptors / Indicators: Sore Pain Intervention(s): Limited activity within patient's tolerance;Monitored during session    Home Living Family/patient expects to be discharged to:: Private residence Living Arrangements: Alone;Other (Comment) Available Help at Discharge: Family;Available PRN/intermittently Type of Home: House Home Access: Level entry     Home Layout: One level Home Equipment: Walker - 2 wheels;Bedside commode Additional Comments: pt lives alone - dtr lives close by    Prior Function Level of Independence: Needs assistance   Gait / Transfers Assistance Needed: Pt reports she used RW after TKR but progressed to IND amb but has recently been using RW again     Comments: Pt states she has difficulty getting into/out of shower     Hand Dominance   Dominant Hand: Right    Extremity/Trunk Assessment   Upper Extremity Assessment: Generalized weakness;Overall [facility] for tasks assessed           Lower Extremity Assessment: RLE deficits/detail RLE Deficits / Details: AROM -  10- 95 with 4/5 strength    Cervical / Trunk Assessment: Normal  Communication   Communication: HOH  Cognition Arousal/Alertness: Awake/alert Behavior During Therapy: WFL for tasks assessed/performed Overall Cognitive Status: Within Functional Limits for tasks assessed       Memory: Decreased short-term memory              General Comments      Exercises         Assessment/Plan    PT Assessment Patient needs continued PT services  PT Diagnosis Difficulty walking   PT Problem List Decreased strength;Decreased range of motion;Decreased activity tolerance;Decreased mobility;Decreased knowledge of use of DME;Obesity;Pain  PT Treatment Interventions DME instruction;Gait training;Functional mobility training;Therapeutic activities;Therapeutic exercise;Patient/family education   PT Goals (Current goals can be found in the Care Plan section) Acute Rehab PT Goals Patient Stated Goal: use the [facility] instead of the bedpan PT Goal Formulation: With patient Time For Goal Achievement: 03/01/14 Potential to Achieve Goals: Good    Frequency Min 3X/week   Barriers to discharge Decreased caregiver support dtr works as EMT    Co-evaluation               End of Session Equipment Utilized During Treatment: Gait belt Activity Tolerance: Patient limited by fatigue Patient left: Other (comment);with bed alarm set (sitting EOB) Nurse Communication: Mobility status         Time: 8916-9450 PT Time Calculation (min) (ACUTE ONLY): 24 min   Charges:   PT Evaluation $Initial PT Evaluation Tier I: 1 Procedure PT Treatments $Gait Training: 8-22 mins   PT G Codes:        Amoree Newlon 04/08/2014, 11:32 AM

## 2014-03-14 NOTE — H&P (Signed)
[facility] History and Physical  Denise Kelly Denise Kelly:[id number] DOB: 1940/06/18 DOA: 03/13/2014  Referring physician: ER physician. PCP: Jonathon Bellows, MD  Chief Complaint: Confusion and fall.  HPI: Denise Kelly is a 73 y.o. female with history of alcoholism who was recently admitted for encephalopathy and at that time patient's ammonia level was minimally elevated and improved with lactulose was discharged to rehabilitation and was back home 3 days ago. As per patient's daughter patient night before this had diarrhea and was confused. Today patient also had a fall but did not lose consciousness. Patient appeared confused. Patient was brought to the ER and in the ER patient had CT head neck and abdomen which were unremarkable. Patient is oriented to his name but at times appears confused. Potassium levels were low and patient UA shows features consistent with UTI. Patient at this time denies any chest pain or shortness of breath but she is mildly tachycardic and on admission patient's lactic acid was mildly elevated. Patient has not had any alcohol for last 3-4 weeks. Patient has a mild right knee swelling from recent surgery 2 months ago but has no active discharge and x-rays do not reveal any thing acute. Patient has been admitted for further management of her recurrence of encephalopathy falls and may need placement.   Review of Systems: As presented in the history of presenting illness, rest negative.  Past Medical History  Diagnosis Date  . Complication of anesthesia     "felt drunk for a week after"  . Hypertension     not being treated with medication  . Palpitations   . Anxiety   . GERD (gastroesophageal reflux disease)   . Arthritis   . Cancer     skin  . Alcohol abuse     ETOH and xanax  . Encephalopathy    Past Surgical History  Procedure Laterality Date  . Tonsillectomy    . Tubal ligation    . 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 Breast surgery       2 breast biopsies  . Eye surgery Bilateral     cataracts  . Colonoscopy    . Total knee arthroplasty Right 01/02/2014    Dr Ronnie Derby  . Total knee arthroplasty Right 01/02/2014    Procedure: TOTAL KNEE ARTHROPLASTY;  Surgeon: [name], MD;  Location: [facility];  Service: Orthopedics;  Laterality: Right;  . Joint replacement Left 09/2009    knee  . Joint replacement Right 01/02/2014   Social History:  reports that she has quit smoking. She has never used smokeless tobacco. She reports that she drinks alcohol. She reports that she does not use illicit drugs. Where does patient live home. Can patient participate in ADLs? Not sure.  Allergies  Allergen Reactions  . Sulfa Antibiotics Hives and Swelling  . Ciprofloxacin Itching  . Motrin [Ibuprofen] Palpitations    Family History: History reviewed. No pertinent family history.    Prior to Admission medications   Medication Sig Start Date End Date Taking? Authorizing Provider  acetaminophen (TYLENOL) 500 MG tablet Take 1,000 mg by mouth daily as needed for mild pain.   Yes Historical Provider, MD  citalopram (CELEXA) 20 MG tablet Take 1 tablet (20 mg total) by mouth at bedtime. 02/24/14  Yes [name], MD  furosemide (LASIX) 20 MG tablet Take 20 mg by mouth daily as needed for edema.  11/01/13  Yes Historical Provider, MD  gabapentin (NEURONTIN) 100 MG capsule  Take 300 mg by mouth at bedtime.  10/31/13  Yes Historical Provider, MD  hydrOXYzine (ATARAX/VISTARIL) 25 MG tablet Take 50 mg by mouth at bedtime.  08/12/13  Yes Historical Provider, MD  loratadine (CLARITIN) 10 MG tablet Take 10 mg by mouth 4 (four) times daily as needed for itching.   Yes Historical Provider, MD  ondansetron (ZOFRAN-ODT) 4 MG disintegrating tablet Take 4 mg by mouth every 6 (six) hours as needed for nausea.  10/31/13  Yes Historical Provider, MD  oxyCODONE (OXY IR/ROXICODONE) 5 MG immediate release tablet Take 1 tablet (5 mg total) by mouth every 6 (six) hours as needed  for moderate pain. 02/24/14  Yes [name], MD  Polyethyl Glycol-Propyl Glycol (SYSTANE OP) Apply 1 drop to eye 3 (three) times daily as needed (dry eyes).   Yes Historical Provider, MD  polyethylene glycol (MIRALAX / GLYCOLAX) packet Take 17 g by mouth daily as needed for moderate constipation.    Yes Historical Provider, MD  potassium chloride SA (K-DUR,KLOR-CON) 20 MEQ tablet Take 1 tablet (20 mEq total) by mouth 2 (two) times daily. 01/20/14  Yes Carlynn Spry, PA-C  senna-docusate (SENOKOT-S) 8.6-50 MG per tablet Take 2 tablets by mouth at bedtime.   Yes Historical Provider, MD  traZODone (DESYREL) 50 MG tablet Take 1 tablet (50 mg total) by mouth at bedtime. 02/24/14  Yes [name], MD  zolpidem (AMBIEN) 10 MG tablet Take 10 mg by mouth at bedtime as needed for sleep.  02/16/14  Yes Historical Provider, MD  methocarbamol (ROBAXIN) 500 MG tablet Take 1-2 tablets (500-1,000 mg total) by mouth every 6 (six) hours as needed for muscle spasms. Patient not taking: Reported on 02/21/2014 01/05/14   Carlynn Spry, PA-C    Physical Exam: Filed Vitals:   03/13/14 2256 03/13/14 2343 03/14/14 0116 03/14/14 0148  BP: 151/98 167/93 144/86 144/88  Pulse: 99 100 102 101  Temp: 97.9 F (36.6 C)  98.5 F (36.9 C) 98.4 F (36.9 C)  TempSrc: Oral  Oral Oral  Resp: 20 20 20 18   Height:    5\' 8"  (1.727 m)  Weight:    90.719 kg (200 lb)  SpO2: 97% 99% 98% 94%     General:  Well-developed and nourished.  Eyes: Anicteric no pallor.  ENT: No discharge from the ears eyes nose and mouth.  Neck: No mass felt.  Cardiovascular: S1-S2 heard.  Respiratory: No rhonchi or crepitations.  Abdomen: Soft nontender bowel sounds present.  Skin: Scar on the right knee does not have any active discharge.  Musculoskeletal: Mild swelling of the right knee.  Psychiatric: Patient is oriented to her name and place but appears confused at times.  Neurologic: Patient is alert awake oriented to her name and place  but appears confused at times. Moves all extremities.  Labs on Admission:  Basic Metabolic Panel:  Recent Labs Lab 03/13/14 1749 03/13/14 2213  NA 142 142  K 2.7* 2.9*  CL 103 102  CO2 26  --   GLUCOSE 129* 126*  BUN <5* <3*  CREATININE 0.75 0.60  CALCIUM 9.4  --    Liver Function Tests:  Recent Labs Lab 03/13/14 1749  AST 29  ALT 14  ALKPHOS 115  BILITOT 0.7  PROT 7.7  ALBUMIN 4.2    Recent Labs Lab 03/13/14 1749  LIPASE 17    Recent Labs Lab 03/13/14 1749  AMMONIA 22   CBC:  Recent Labs Lab 03/13/14 1749 03/13/14 2213  WBC 8.8  --  NEUTROABS 5.9  --   HGB 15.4* 15.6*  HCT 44.9 46.0  MCV 88.9  --   PLT 221  --    Cardiac Enzymes: No results for input(s): CKTOTAL, CKMB, CKMBINDEX, TROPONINI in the last 168 hours.  BNP (last 3 results)  Recent Labs  02/21/14 1835  PROBNP 166.9*   CBG: No results for input(s): GLUCAP in the last 168 hours.  Radiological Exams on Admission: Dg Chest 2 View  03/14/2014   CLINICAL DATA:  Weakness, headache, diarrhea, lethargy  EXAM: CHEST  2 VIEW  COMPARISON:  CT chest dated 02/21/2014  FINDINGS: Mild left basilar opacity, likely atelectasis. No focal consolidation. No pleural effusion or pneumothorax.  Mild cardiomegaly.  Degenerative changes of the visualized thoracolumbar spine.  IMPRESSION: No evidence of acute cardiopulmonary disease.   Electronically Signed   By: Julian Hy M.D.   On: 03/14/2014 00:08   Ct Head Wo Contrast  03/13/2014   CLINICAL DATA:  Patient status post fall.  EXAM: CT HEAD WITHOUT CONTRAST  CT NECK WITHOUT CONTRAST  TECHNIQUE: Contiguous axial images were obtained from the base of the skull through the vertex without contrast. Multidetector CT imaging of the neck was performed using the standard protocol without intravenous contrast.  COMPARISON:  Brain CT 02/21/2014  FINDINGS: CT HEAD FINDINGS  Re- demonstrated high attenuation material within the left cavernous sinus compatible  with previous treatment for carotid cavernous sinus fistula. Periventricular and subcortical white matter hypodensity compatible with chronic small vessel ischemic change. Old right basal ganglia lacune or infarct. No evidence for acute intracranial infarct, hemorrhage, mass lesion or mass effect. Ventricles and sulci are appropriate for patient's age. Mastoid air cells are unremarkable. Calvarium is intact.  CT NECK FINDINGS  Straightening of the normal cervical lordosis with focal kyphosis at the C4-5 level. C5-6 and C6-7 degenerative disc disease prevertebral soft tissues unremarkable. Cranial cervical junction unremarkable. No evidence for acute fracture.  IMPRESSION: No evidence for acute cervical spine fracture.  Focal kyphosis at the C4-5 level may be secondary to patient positioning. Recommend clinical correlation to exclude ligamentous injury.  No acute intracranial process.  Chronic small vessel ischemic change.  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   Electronically Signed   By: Lovey Newcomer M.D.   On: 03/13/2014 21:50   Ct Cervical Spine Wo Contrast  03/13/2014   CLINICAL DATA:  Patient status post fall.  EXAM: CT HEAD WITHOUT CONTRAST  CT NECK WITHOUT CONTRAST  TECHNIQUE: Contiguous axial images were obtained from the base of the skull through the vertex without contrast. Multidetector CT imaging of the neck was performed using the standard protocol without intravenous contrast.  COMPARISON:  Brain CT 02/21/2014  FINDINGS: CT HEAD FINDINGS  Re- demonstrated high attenuation material within the left cavernous sinus compatible with previous treatment for carotid cavernous sinus fistula. Periventricular and subcortical white matter hypodensity compatible with chronic small vessel ischemic change. Old right basal ganglia lacune or infarct. No evidence for acute intracranial infarct, hemorrhage, mass lesion or mass effect. Ventricles and sulci are appropriate for patient's age.  Mastoid air cells are unremarkable. Calvarium is intact.  CT NECK FINDINGS  Straightening of the normal cervical lordosis with focal kyphosis at the C4-5 level. C5-6 and C6-7 degenerative disc disease prevertebral soft tissues unremarkable. Cranial cervical junction unremarkable. No evidence for acute fracture.  IMPRESSION: No evidence for acute cervical spine fracture.  Focal kyphosis at the C4-5 level may be secondary to patient positioning. Recommend clinical  correlation to exclude ligamentous injury.  No acute intracranial process.  Chronic small vessel ischemic change.  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   Electronically Signed   By: Lovey Newcomer M.D.   On: 03/13/2014 21:50   Ct Abdomen Pelvis W Contrast  03/13/2014   CLINICAL DATA:  Fall, lower abdominal pain  EXAM: CT ABDOMEN AND PELVIS WITH CONTRAST  TECHNIQUE: Multidetector CT imaging of the abdomen and pelvis was performed using the standard protocol following bolus administration of intravenous contrast.  CONTRAST:  189mL OMNIPAQUE IOHEXOL 300 MG/ML  SOLN  COMPARISON:  MRI abdomen dated 06/29/2012  FINDINGS: Lower chest:  Lung bases are clear.  Old right posterior rib deformities.  Hepatobiliary: Scattered hepatic cysts. Stable 13 mm enhancing lesion in the anterior segment right hepatic dome (series 2/image 16), chronic, likely reflecting an atypical hemangioma or focal nodular hyperplasia when correlating with prior MRI.  Gallbladder is notable for fundal adenomyomatosis (series 2/ image 22). No intrahepatic or extrahepatic ductal dilatation.  Pancreas: Within normal limits.  Spleen: Within normal limits.  Adrenals/Urinary Tract: Adrenal glands are unremarkable.  Kidneys are within normal limits.  No hydronephrosis.  Bladder is unremarkable.  Stomach/Bowel: Stomach is unremarkable.  No evidence of bowel obstruction.  Normal appendix.  Extensive sigmoid diverticulosis, without evidence of diverticulitis.   Vascular/Lymphatic: Atherosclerotic calcifications of the abdominal aorta and branch vessels.  No suspicious abdominopelvic lymphadenopathy.  Reproductive: Uterus is unremarkable.  Bilateral ovaries are within normal limits.  Other: No abdominopelvic ascites.  Small fat containing right inguinal hernia.  Musculoskeletal: Degenerative changes of the visualized thoracolumbar spine.  IMPRESSION: No evidence of bowel obstruction.  Normal appendix.  Extensive sigmoid diverticulosis, without evidence of diverticulitis.  No CT findings to account for the patient's low abdominal pain.  Additional stable ancillary findings as above.   Electronically Signed   By: Julian Hy M.D.   On: 03/13/2014 22:31   Dg Knee Complete 4 Views Right  03/13/2014   CLINICAL DATA:  Pain following fall  EXAM: RIGHT KNEE - COMPLETE 4+ VIEW  COMPARISON:  February 21, 2014  FINDINGS: Frontal, lateral, and bilateral oblique views were obtained. Patient is status post total knee replacement with the prosthetic components appearing well-seated. No fracture or dislocation. No joint effusion. No erosive change. Bones are osteoporotic.  IMPRESSION: Prosthetic components appear well seated. No fracture or dislocation. Bones appear somewhat osteoporotic.   Electronically Signed   By: Lowella Grip M.D.   On: 03/13/2014 19:50    EKG: Independently reviewed. Sinus tachycardia.  Assessment/Plan Principal Problem:   Acute encephalopathy Active Problems:   S/P total knee arthroplasty   Hypokalemia   UTI (lower urinary tract infection)   1. Acute encephalopathy - at this time ammonia levels are normal. Patient's confusional status could be related to her medications versus UTI. We will recheck ammonia levels. Continue with antibiotics for UTI. Patient medications has to be closely monitored and some of them may need to be discontinued lordosis is reduced. Closely observe. Since patient has history of alcoholism we will also check a  thiamine level and patient will be placed on thiamine replacement IV. At this time patient does not have any definite features of Wernicke's encephalopathy. 2. Falls - physical therapy consult and patient may need placement. Gently hydrate this patient's lactic acid was mildly elevated. Check orthostatics in a.m. Check CK levels. 3. Hypokalemia - may be related to her diarrhea. Replace and recheck and check magnesium levels. 4. Sinus tachycardia -  gently hydrate and check d-dimer TSH. 5. UTI - on ceftriaxone. Check urine culture. 6. Diarrhea - check stool studies. Patient is on Florastor. 7. History of hypertension - presently holding of Lasix due to tachycardia and increased lactic acid levels. Patient will be on when necessary IV hydralazine for now.   DVT Prophylaxis Lovenox.  Code Status: Full code.  Family Communication: None.  Disposition Plan: Admit to inpatient.    KAKRAKANDY,ARSHAD N. [facility] Pager [phone].  If 7PM-7AM, please contact night-coverage [email or web address] Password [email or web address] 03/14/2014, 2:22 AM

## 2014-03-14 NOTE — ED Notes (Signed)
Patient is alert but more oriented than before. She is using her cell phone.

## 2014-03-14 NOTE — Progress Notes (Signed)
ANTIBIOTIC CONSULT NOTE - INITIAL  Pharmacy Consult for Ceftriaxone Indication: UTI  Allergies  Allergen Reactions  . Sulfa Antibiotics Hives and Swelling  . Ciprofloxacin Itching  . Motrin [Ibuprofen] Palpitations    Patient Measurements: Height: 5\' 8"  (172.7 cm) Weight: 200 lb (90.719 kg) IBW/kg (Calculated) : 63.9  Vital Signs: Temp: 98.4 F (36.9 C) (12/29 0148) Temp Source: Oral (12/29 0148) BP: 144/88 mmHg (12/29 0148) Pulse Rate: 101 (12/29 0148) Intake/Output from previous day:   Intake/Output from this shift:    Labs:  Recent Labs  03/13/14 1749 03/13/14 2213  WBC 8.8  --   HGB 15.4* 15.6*  PLT 221  --   CREATININE 0.75 0.60   Estimated Creatinine Clearance: 74.9 mL/min (by C-G formula based on Cr of 0.6). No results for input(s): VANCOTROUGH, VANCOPEAK, VANCORANDOM, GENTTROUGH, GENTPEAK, GENTRANDOM, TOBRATROUGH, TOBRAPEAK, TOBRARND, AMIKACINPEAK, AMIKACINTROU, AMIKACIN in the last 72 hours.   Microbiology: Recent Results (from the past 720 hour(s))  Blood culture (routine x 2)     Status: None   Collection Time: 02/21/14  5:03 PM  Result Value Ref Range Status   Specimen Description BLOOD LEFT ANTECUBITAL  Final   Special Requests BOTTLES DRAWN AEROBIC AND ANAEROBIC 5CC  Final   Culture  Setup Time   Final    02/21/2014 22:41 Performed at [facility]    Culture   Final    NO GROWTH 5 DAYS Performed at [facility]    Report Status 02/27/2014 FINAL  Final  Blood culture (routine x 2)     Status: None   Collection Time: 02/21/14  5:05 PM  Result Value Ref Range Status   Specimen Description BLOOD LEFT ANTECUBITAL  Final   Special Requests BOTTLES DRAWN AEROBIC AND ANAEROBIC 5CC  Final   Culture  Setup Time   Final    02/21/2014 22:41 Performed at [facility]    Culture   Final    NO GROWTH 5 DAYS Performed at [facility]    Report Status 02/27/2014 FINAL  Final  Urine culture     Status: None   Collection Time: 02/21/14  5:23 PM  Result Value Ref Range Status   Specimen Description URINE, CATHETERIZED  Final   Special Requests NONE  Final   Culture  Setup Time   Final    02/21/2014 22:52 Performed at [facility]   Final    >=100,000 COLONIES/ML Performed at [facility]    Culture   Final    ESCHERICHIA COLI Performed at [facility]    Report Status 02/24/2014 FINAL  Final   Organism ID, Bacteria ESCHERICHIA COLI  Final      Susceptibility   Escherichia coli - MIC*    AMPICILLIN 4 SENSITIVE Sensitive     CEFAZOLIN <=4 SENSITIVE Sensitive     CEFTRIAXONE <=1 SENSITIVE Sensitive     CIPROFLOXACIN <=0.25 SENSITIVE Sensitive     GENTAMICIN <=1 SENSITIVE Sensitive     LEVOFLOXACIN <=0.12 SENSITIVE Sensitive     NITROFURANTOIN 32 SENSITIVE Sensitive     TOBRAMYCIN <=1 SENSITIVE Sensitive     TRIMETH/SULFA <=20 SENSITIVE Sensitive     PIP/TAZO <=4 SENSITIVE Sensitive     * ESCHERICHIA COLI  Clostridium Difficile by PCR     Status: None   Collection Time: 02/21/14  9:00 PM  Result Value Ref Range Status   C difficile by pcr NEGATIVE NEGATIVE Final    Comment: Performed  at [facility] History: Past Medical History  Diagnosis Date  . Complication of anesthesia     "felt drunk for a week after"  . Hypertension     not being treated with medication  . Palpitations   . Anxiety   . GERD (gastroesophageal reflux disease)   . Arthritis   . Cancer     skin  . Alcohol abuse     ETOH and xanax  . Encephalopathy     Medications:  Scheduled:  . cefTRIAXone (ROCEPHIN)  IV  1 g Intravenous Q24H  . citalopram  20 mg Oral QHS  . enoxaparin (LOVENOX) injection  40 mg Subcutaneous Q24H  . feeding supplement (ENSURE COMPLETE)  237 mL Oral BID BM  . gabapentin  300 mg Oral QHS  . hydrOXYzine  50 mg Oral QHS  . potassium chloride  10 mEq Intravenous Q1 Hr x 2  . potassium chloride  40 mEq Oral Once  .  saccharomyces boulardii  250 mg Oral BID  . sodium chloride  3 mL Intravenous Q12H  . sodium chloride  3 mL Intravenous Q12H  . thiamine IV  100 mg Intravenous Daily  . traZODone  50 mg Oral QHS   Infusions:   PRN: acetaminophen **OR** acetaminophen, loratadine, methocarbamol, morphine injection, ondansetron **OR** ondansetron (ZOFRAN) IV, ondansetron, oxyCODONE, zolpidem  Assessment: 73 yo female with hx hepatic encephalopathy, EtOH abuse, R THR 01/02/14 who presents from home with AMS.  Pharmacy is consulted to dose ceftriaxone for presumed UTI.  Goal of Therapy:  Eradication of infection  Plan:   Ceftriaxone 1g IV q24h  No dosage adjustments needed - Pharmacy will sign off  Peggyann Juba, PharmD, BCPS Pager: [phone] 03/14/2014,2:29 AM

## 2014-03-14 NOTE — Progress Notes (Signed)
Decreased K+ 10 meq to 75 ml/hr pt complaint of IV site burning, noted KVO fluids to dilute K+

## 2014-03-14 NOTE — Progress Notes (Signed)
Nutrition Brief Note  Patient identified on the Malnutrition Screening Tool (MST) Report  Wt Readings from Last 15 Encounters:  03/14/14 200 lb (90.719 kg)  02/21/14 200 lb (90.719 kg)  01/19/14 217 lb 4.9 oz (98.57 kg)  01/04/14 219 lb 12.8 oz (99.701 kg)  12/29/13 219 lb 12.8 oz (99.701 kg)  11/10/13 219 lb (99.338 kg)    Body mass index is 30.42 kg/(m^2). Patient meets criteria for Obesity I based on current BMI.   Current diet order is Heart Healthy, patient is consuming approximately 100% of meals at this time. Labs and medications reviewed.   Previous medical records indicate pt with stable weight around 200 lbs for past month. Was seen by RD during previous admit in 02/22/2014, and pt denied change in appetite or weight. Declined nutrition supplementation or snacks. Pt currently with excellent appetite.  No nutrition interventions warranted at this time. If nutrition issues arise, please consult RD.   Atlee Abide MS RD LDN Clinical Dietitian [phone]

## 2014-03-14 NOTE — Progress Notes (Signed)
Clinical Social Work Department CLINICAL SOCIAL WORK PLACEMENT NOTE 03/14/2014  Patient:  Denise Kelly, Denise Kelly  Account Number:  [id number] [place] date:  03/13/2014  Clinical Social Worker:  Renold Genta  Date/time:  03/14/2014 05:11 PM  Clinical Social Work is seeking post-discharge placement for this patient at the following level of care:   SKILLED NURSING   (*CSW will update this form in Epic as items are completed)   03/14/2014  Patient/family provided with [facility] Department of Clinical Social Work's list of facilities offering this level of care within the geographic area requested by the patient (or if unable, by the patient's family).  03/14/2014  Patient/family informed of their freedom to choose among providers that offer the needed level of care, that participate in Medicare, Medicaid or managed care program needed by the patient, have an available bed and are willing to accept the patient.  03/14/2014  Patient/family informed of [facility]' ownership interest in [facility], as well as of the fact that they are under no obligation to receive care at this facility.  PASARR submitted to EDS on 03/14/2014 PASARR number received on 03/14/2014  FL2 transmitted to all facilities in geographic area requested by pt/family on  03/14/2014 FL2 transmitted to all facilities within larger geographic area on   Patient informed that his/her managed care company has contracts with or will negotiate with  certain facilities, including the following:     Patient/family informed of bed offers received:  03/14/2014 Patient chooses bed at [facility] [place] Physician recommends and patient chooses bed at    Patient to be transferred to [place] on   Patient to be transferred to facility by  Patient and family notified of transfer on  Name of family member notified:    The following physician request were entered in  Epic:   Additional Comments:   Raynaldo Opitz, [place] Social Worker cell #: [phone]

## 2014-03-15 DIAGNOSIS — I10 Essential (primary) hypertension: Secondary | ICD-10-CM

## 2014-03-15 DIAGNOSIS — R197 Diarrhea, unspecified: Secondary | ICD-10-CM

## 2014-03-15 LAB — MAGNESIUM: Magnesium: 1.7 mg/dL (ref 1.5–2.5)

## 2014-03-15 LAB — CBC
HEMATOCRIT: 47.9 % — AB (ref 36.0–46.0)
Hemoglobin: 16.1 g/dL — ABNORMAL HIGH (ref 12.0–15.0)
MCH: 30.6 pg (ref 26.0–34.0)
MCHC: 33.6 g/dL (ref 30.0–36.0)
MCV: 90.9 fL (ref 78.0–100.0)
Platelets: 202 10*3/uL (ref 150–400)
RBC: 5.27 MIL/uL — ABNORMAL HIGH (ref 3.87–5.11)
RDW: 14.3 % (ref 11.5–15.5)
WBC: 8.4 10*3/uL (ref 4.0–10.5)

## 2014-03-15 LAB — BASIC METABOLIC PANEL
Anion gap: 9 (ref 5–15)
BUN: <5 mg/dL — ABNORMAL LOW (ref 6–23)
CALCIUM: 9.1 mg/dL (ref 8.4–10.5)
CO2: 28 mmol/L (ref 19–32)
Chloride: 106 mEq/L (ref 96–112)
Creatinine, Ser: 0.64 mg/dL (ref 0.50–1.10)
GFR calc Af Amer: >90 mL/min (ref >90)
GFR calc non Af Amer: 87 mL/min — ABNORMAL LOW (ref >90)
Glucose, Bld: 113 mg/dL — ABNORMAL HIGH (ref 70–99)
Potassium: 3 mmol/L — ABNORMAL LOW (ref 3.5–5.1)
SODIUM: 143 mmol/L (ref 135–145)

## 2014-03-15 MED ORDER — AMOXICILLIN 500 MG PO CAPS
500.0000 mg | ORAL_CAPSULE | Freq: Three times a day (TID) | ORAL | Status: DC
  Administered 2014-03-15: 500 mg via ORAL
  Filled 2014-03-15 (×3): qty 1

## 2014-03-15 MED ORDER — SODIUM CHLORIDE 0.9 % IV BOLUS (SEPSIS)
1000.0000 mL | Freq: Once | INTRAVENOUS | Status: AC
  Administered 2014-03-15: 1000 mL via INTRAVENOUS

## 2014-03-15 MED ORDER — POTASSIUM CHLORIDE CRYS ER 20 MEQ PO TBCR
40.0000 meq | EXTENDED_RELEASE_TABLET | Freq: Once | ORAL | Status: AC
  Administered 2014-03-15: 40 meq via ORAL
  Filled 2014-03-15: qty 2

## 2014-03-15 MED ORDER — ENSURE COMPLETE PO LIQD: 237.0000 mL | Freq: Two times a day (BID) | ORAL | Status: DC

## 2014-03-15 MED ORDER — OXYCODONE HCL 5 MG PO TABS: 5.0000 mg | ORAL_TABLET | Freq: Four times a day (QID) | ORAL | Status: DC | PRN

## 2014-03-15 MED ORDER — AMOXICILLIN 500 MG PO CAPS: 500.0000 mg | ORAL_CAPSULE | Freq: Three times a day (TID) | ORAL | Status: DC

## 2014-03-15 NOTE — Progress Notes (Signed)
Patient is set to discharge to [facility] today. Patient & daughter, Baldo Ash aware. Discharge packet given to RN, Manuela Schwartz. Daughter to transport to SNF.    Clinical Social Work Department CLINICAL SOCIAL WORK PLACEMENT NOTE 03/15/2014  Patient:  Denise Kelly, Denise Kelly  Account Number:  [id number] [place] date:  03/13/2014  Clinical Social Worker:  Renold Genta  Date/time:  03/14/2014 05:11 PM  Clinical Social Work is seeking post-discharge placement for this patient at the following level of care:   SKILLED NURSING   (*CSW will update this form in Epic as items are completed)   03/14/2014  Patient/family provided with [facility] Department of Clinical Social Work's list of facilities offering this level of care within the geographic area requested by the patient (or if unable, by the patient's family).  03/14/2014  Patient/family informed of their freedom to choose among providers that offer the needed level of care, that participate in Medicare, Medicaid or managed care program needed by the patient, have an available bed and are willing to accept the patient.  03/14/2014  Patient/family informed of [facility]' ownership interest in [facility], as well as of the fact that they are under no obligation to receive care at this facility.  PASARR submitted to EDS on 03/14/2014 PASARR number received on 03/14/2014  FL2 transmitted to all facilities in geographic area requested by pt/family on  03/14/2014 FL2 transmitted to all facilities within larger geographic area on   Patient informed that his/her managed care company has contracts with or will negotiate with  certain facilities, including the following:     Patient/family informed of bed offers received:  03/14/2014 Patient chooses bed at [facility], [place] Physician recommends and patient chooses bed at    Patient to be transferred to [place] on   03/15/2014 Patient to be transferred to facility by patient's daughter, Baldo Ash Patient and family notified of transfer on 03/15/2014 Name of family member notified:  patient's daughter, Baldo Ash  The following physician request were entered in Epic:   Additional Comments:   Raynaldo Opitz, [place] Social Worker cell #: [phone]

## 2014-03-15 NOTE — Progress Notes (Signed)
Report called to [facility] and talked with [facility] LPN.

## 2014-03-15 NOTE — Discharge Summary (Signed)
Physician Discharge Summary  Denise Kelly:[id number] DOB: 1940/07/22 DOA: 03/13/2014  PCP: [name], D, MD  Admit date: 03/13/2014 Discharge date: 03/15/2014  Recommendations for Outpatient Follow-up:  1. Pt will need to follow up with PCP in 2 weeks post discharge Please obtain BMP in one week Discharge Diagnoses:  acute encephalopathy  -likely due to urinary tract infection And volume depletion -The patient did develop some delirium after she was given Ambien--Ambien will be discontinued -On the day of discharge, the patient's mentation was at baseline -Ammonia 17 -02/21/2014 TSH 1.320  -Check RPR, B12, RBC folate -EKG shows sinus rhythm with nonspecific ST changes, unchanged from prior EKGs Diarrhea  -C. difficile negative  -03/13/2014 CT abdomen and pelvis showed diverticulosis with gallbladder fundal adenomyomatosis without any other acute abnormalities  Pyuria  -Concerning for UTI  -continued empiric ceftriaxone -Since the patient has had Escherichia coli on 2 separate occasions on 02/01/2014 and 02/21/2014, the patient will be discharged with amoxicillin for 6 more days which will complete 7 days of therapy  Right knee pain/right knee effusion -X-ray of the right knee Prosthetic components appear well seated. No fracture or Dislocation. Hypokalemia -repleted -check mag--1.7 Depression/substance induced mood disorder -02/24/14 psychiatry--> increased Celexa to 20 mg daily, added trazodone at bedtime -Continue current doses  Gait instability/frequent falls  -PT recommends skilled nursing facility--> patient agreed to go to [facility] Alcohol abuse  -Patient states that she has not drank in 3-4 weeks   Discharge Condition: stable  Disposition: SNF  Diet:heart healthy Wt Readings from Last 3 Encounters:  03/15/14 90.8 kg (200 lb 2.8 oz)  02/21/14 90.719 kg (200 lb)  01/19/14 98.57 kg (217 lb 4.9 oz)    History of present illness:  73 year old  morbidly female , status post knee arthroplasty , recently admitted 01/18/14 for altered mental status, right knee pain, urinary tract infection , presents to the ER with a similar presentation today. Apparently Dr. Ronnie Derby had examined the patient knee in November during this admission, and he did not suspect any infection. Patient was found to have Escherichia coli bacteremia and Escherichia coli in her urine, treated with Rocephin in the hospital and subsequently discharged with amoxicillin of which pt endorsed compliance. The patient was readmitted to [facility] on 02/21/2014 with acute encephalopathy that was likely multifactorial including elevated ammonia and alcohol withdrawal. The patient was discharged to [facility] without abx.  The patient was readmitted on 03/13/2014 with acute encephalopathy. Workup revealed UTI. Her ammonia was 17. The patient was also hypokalemic which was repleted. The patient continued to have loose stools, and her C. difficile PCR was negative during this admission. The patient was hydrated intravenously. The patient was discharged with amoxicillin for 6 additional days which will finish 7 days of therapy for her UTI.  Although the patient's d-dimer was elevated, the patient had a recent CT exam of the chest on 02/21/2014 which was negative for pulmonary embolus. In addition, the patient had oxygen saturation of 100% on room air without any chest discomfort or shortness of breath.    Discharge Exam: Filed Vitals:   03/15/14 0610  BP: 130/72  Pulse: 94  Temp: 98.1 F (36.7 C)  Resp: 20   Filed Vitals:   03/14/14 2200 03/15/14 0327 03/15/14 0340 03/15/14 0610  BP: 162/80 172/96 155/80 130/72  Pulse: 100 115 116 94  Temp:    98.1 F (36.7 C)  TempSrc:    Oral  Resp: 16 18 20 20   Height:  Weight:    90.8 kg (200 lb 2.8 oz)  SpO2: 97% 98% 100% 92%   General: A&O x 3, NAD, pleasant, cooperative Cardiovascular: RRR, no rub, no gallop, no S3 Respiratory: CTAB, no  wheeze, no rhonchi Abdomen:soft, nontender, nondistended, positive bowel sounds Extremities: 1+LE edema, No lymphangitis, no petechiae  Discharge Instructions      Discharge Instructions    Diet - low sodium heart healthy    Complete by:  As directed      Increase activity slowly    Complete by:  As directed             Medication List    STOP taking these medications        zolpidem 10 MG tablet  Commonly known as:  AMBIEN      TAKE these medications        acetaminophen 500 MG tablet  Commonly known as:  TYLENOL  Take 1,000 mg by mouth daily as needed for mild pain.     amoxicillin 500 MG capsule  Commonly known as:  AMOXIL  Take 1 capsule (500 mg total) by mouth every 8 (eight) hours.     citalopram 20 MG tablet  Commonly known as:  CELEXA  Take 1 tablet (20 mg total) by mouth at bedtime.     CLARITIN 10 MG tablet  Generic drug:  loratadine  Take 10 mg by mouth 4 (four) times daily as needed for itching.     feeding supplement (ENSURE COMPLETE) Liqd  Take 237 mLs by mouth 2 (two) times daily between meals.     furosemide 20 MG tablet  Commonly known as:  LASIX  Take 20 mg by mouth daily as needed for edema.     gabapentin 100 MG capsule  Commonly known as:  NEURONTIN  Take 300 mg by mouth at bedtime.     hydrOXYzine 25 MG tablet  Commonly known as:  ATARAX/VISTARIL  Take 50 mg by mouth at bedtime.     methocarbamol 500 MG tablet  Commonly known as:  ROBAXIN  Take 1-2 tablets (500-1,000 mg total) by mouth every 6 (six) hours as needed for muscle spasms.     ondansetron 4 MG disintegrating tablet  Commonly known as:  ZOFRAN-ODT  Take 4 mg by mouth every 6 (six) hours as needed for nausea.     oxyCODONE 5 MG immediate release tablet  Commonly known as:  Oxy IR/ROXICODONE  Take 1 tablet (5 mg total) by mouth every 6 (six) hours as needed for moderate pain.     polyethylene glycol packet  Commonly known as:  MIRALAX / GLYCOLAX  Take 17 g by mouth  daily as needed for moderate constipation.     potassium chloride SA 20 MEQ tablet  Commonly known as:  K-DUR,KLOR-CON  Take 1 tablet (20 mEq total) by mouth 2 (two) times daily.     senna-docusate 8.6-50 MG per tablet  Commonly known as:  Senokot-S  Take 2 tablets by mouth at bedtime.     SYSTANE OP  Apply 1 drop to eye 3 (three) times daily as needed (dry eyes).     traZODone 50 MG tablet  Commonly known as:  DESYREL  Take 1 tablet (50 mg total) by mouth at bedtime.         The results of significant diagnostics from this hospitalization (including imaging, microbiology, ancillary and laboratory) are listed below for reference.    Significant Diagnostic Studies: Dg Chest 2 View  03/14/2014  CLINICAL DATA:  Weakness, headache, diarrhea, lethargy  EXAM: CHEST  2 VIEW  COMPARISON:  CT chest dated 02/21/2014  FINDINGS: Mild left basilar opacity, likely atelectasis. No focal consolidation. No pleural effusion or pneumothorax.  Mild cardiomegaly.  Degenerative changes of the visualized thoracolumbar spine.  IMPRESSION: No evidence of acute cardiopulmonary disease.   Electronically Signed   By: Julian Hy M.D.   On: 03/14/2014 00:08   Dg Chest 2 View  02/21/2014   CLINICAL DATA:  Shortness of breath.  EXAM: CHEST  2 VIEW  COMPARISON:  January 17, 2014.  FINDINGS: The heart size and mediastinal contours are within normal limits. Both lungs are clear. No pneumothorax or pleural effusion is noted. The visualized skeletal structures are unremarkable.  IMPRESSION: No acute cardiopulmonary abnormality seen.   Electronically Signed   By: Sabino Dick M.D.   On: 02/21/2014 15:13   Ct Head Wo Contrast  03/13/2014   CLINICAL DATA:  Patient status post fall.  EXAM: CT HEAD WITHOUT CONTRAST  CT NECK WITHOUT CONTRAST  TECHNIQUE: Contiguous axial images were obtained from the base of the skull through the vertex without contrast. Multidetector CT imaging of the neck was performed using the  standard protocol without intravenous contrast.  COMPARISON:  Brain CT 02/21/2014  FINDINGS: CT HEAD FINDINGS  Re- demonstrated high attenuation material within the left cavernous sinus compatible with previous treatment for carotid cavernous sinus fistula. Periventricular and subcortical white matter hypodensity compatible with chronic small vessel ischemic change. Old right basal ganglia lacune or infarct. No evidence for acute intracranial infarct, hemorrhage, mass lesion or mass effect. Ventricles and sulci are appropriate for patient's age. Mastoid air cells are unremarkable. Calvarium is intact.  CT NECK FINDINGS  Straightening of the normal cervical lordosis with focal kyphosis at the C4-5 level. C5-6 and C6-7 degenerative disc disease prevertebral soft tissues unremarkable. Cranial cervical junction unremarkable. No evidence for acute fracture.  IMPRESSION: No evidence for acute cervical spine fracture.  Focal kyphosis at the C4-5 level may be secondary to patient positioning. Recommend clinical correlation to exclude ligamentous injury.  No acute intracranial process.  Chronic small vessel ischemic change.  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   Electronically Signed   By: Lovey Newcomer M.D.   On: 03/13/2014 21:50   Ct Head Wo Contrast  02/21/2014   CLINICAL DATA:  Increasing confusion. Altered mental status. History of carotid cavernous sinus fistula.  EXAM: CT HEAD WITHOUT CONTRAST  TECHNIQUE: Contiguous axial images were obtained from the base of the skull through the vertex without intravenous contrast.  COMPARISON:  None.  FINDINGS: There is extensive high density material in the region of the left cavernous sinus consistent with including from previous treatment for carotid cavernous sinus fistula. There is a small collection of the dense material in the left temporal fossa laterally. This material obscures some detail.  There is a tiny old lacunar infarct in the right  basal ganglia. Brain parenchyma otherwise appears normal other than mild atrophy with secondary slight ventricular dilatation. There is no acute intracranial hemorrhage, infarction, or mass lesion. Osseous structures are normal.  IMPRESSION: No acute abnormality. Previous treatment for left carotid cavernous sinus fistula. Tiny old lacunar infarct in the right basal ganglia.   Electronically Signed   By: Rozetta Nunnery M.D.   On: 02/21/2014 15:45   Ct Angio Chest Pe W/cm &/or Wo Cm  02/21/2014   CLINICAL DATA:  Short of breath  EXAM: CT ANGIOGRAPHY CHEST  WITH CONTRAST  TECHNIQUE: Multidetector CT imaging of the chest was performed using the standard protocol during bolus administration of intravenous contrast. Multiplanar CT image reconstructions and MIPs were obtained to evaluate the vascular anatomy.  CONTRAST:  132mL OMNIPAQUE IOHEXOL 350 MG/ML SOLN  COMPARISON:  CT chest 04/23/2009  FINDINGS: The patient could not hold her breath and the image quality is degraded by significant motion.  Negative for pulmonary emboli. Small emboli could be missed on the study due to motion. Negative for aortic dissection. Mild atherosclerotic disease in the aorta. Coronary artery calcification. Mild cardiac enlargement without pericardial effusion.  Calcified granuloma right upper lobe unchanged from the prior CT. Negative for infiltrate or effusion. Negative for mass or adenopathy.  No acute abnormality in the upper abdomen. No acute thoracic spine abnormality.  Review of the MIP images confirms the above findings.  IMPRESSION: Image quality degraded by motion from breathing  Negative for pulmonary embolism.  No acute abnormality.   Electronically Signed   By: Franchot Gallo M.D.   On: 02/21/2014 15:50   Ct Cervical Spine Wo Contrast  03/13/2014   CLINICAL DATA:  Patient status post fall.  EXAM: CT HEAD WITHOUT CONTRAST  CT NECK WITHOUT CONTRAST  TECHNIQUE: Contiguous axial images were obtained from the base of the skull  through the vertex without contrast. Multidetector CT imaging of the neck was performed using the standard protocol without intravenous contrast.  COMPARISON:  Brain CT 02/21/2014  FINDINGS: CT HEAD FINDINGS  Re- demonstrated high attenuation material within the left cavernous sinus compatible with previous treatment for carotid cavernous sinus fistula. Periventricular and subcortical white matter hypodensity compatible with chronic small vessel ischemic change. Old right basal ganglia lacune or infarct. No evidence for acute intracranial infarct, hemorrhage, mass lesion or mass effect. Ventricles and sulci are appropriate for patient's age. Mastoid air cells are unremarkable. Calvarium is intact.  CT NECK FINDINGS  Straightening of the normal cervical lordosis with focal kyphosis at the C4-5 level. C5-6 and C6-7 degenerative disc disease prevertebral soft tissues unremarkable. Cranial cervical junction unremarkable. No evidence for acute fracture.  IMPRESSION: No evidence for acute cervical spine fracture.  Focal kyphosis at the C4-5 level may be secondary to patient positioning. Recommend clinical correlation to exclude ligamentous injury.  No acute intracranial process.  Chronic small vessel ischemic change.  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   Electronically Signed   By: Lovey Newcomer M.D.   On: 03/13/2014 21:50   US Abdomen Complete  02/23/2014   CLINICAL DATA:  Hepatic encephalopathy  EXAM: ULTRASOUND ABDOMEN COMPLETE  COMPARISON:  Ultrasound 07/03/2010  FINDINGS: Gallbladder: No gallstones or wall thickening visualized. No sonographic Murphy sign noted.  Common bile duct: Diameter: 9 mm in diameter mild prominent size  Liver: Diffuse increased echogenicity of the liver suspicious for fatty infiltration. There is a cyst in left hepatic lobe measures 1.3 x 1.2 cm.  IVC: No abnormality visualized.  Pancreas: Visualized portion unremarkable.  Spleen: Size and appearance within  normal limits.  6.4 cm in length.  Right Kidney: Length: 10.5 cm. Echogenicity within normal limits. No mass or hydronephrosis visualized.  Left Kidney: Length: 12 cm. Echogenicity within normal limits. No mass or hydronephrosis visualized.  Abdominal aorta: No aneurysm visualized. Measures up to 1.7 cm in diameter. No abdominal ascites.  Other findings: None.  IMPRESSION: 1. No gallstones are noted within gallbladder. 2. CBD measures 9 mm in diameter mild prominent in size. No intrahepatic biliary ductal dilatation.  3. Mild increased echogenicity of the liver suspicious for fatty infiltration. There is a cyst in left hepatic lobe measures 1.3 cm. 4. No hydronephrosis.  No renal calculi. 5. No aortic aneurysm.   Electronically Signed   By: Lahoma Crocker M.D.   On: 02/23/2014 10:40   Ct Abdomen Pelvis W Contrast  03/13/2014   CLINICAL DATA:  Fall, lower abdominal pain  EXAM: CT ABDOMEN AND PELVIS WITH CONTRAST  TECHNIQUE: Multidetector CT imaging of the abdomen and pelvis was performed using the standard protocol following bolus administration of intravenous contrast.  CONTRAST:  176mL OMNIPAQUE IOHEXOL 300 MG/ML  SOLN  COMPARISON:  MRI abdomen dated 06/29/2012  FINDINGS: Lower chest:  Lung bases are clear.  Old right posterior rib deformities.  Hepatobiliary: Scattered hepatic cysts. Stable 13 mm enhancing lesion in the anterior segment right hepatic dome (series 2/image 16), chronic, likely reflecting an atypical hemangioma or focal nodular hyperplasia when correlating with prior MRI.  Gallbladder is notable for fundal adenomyomatosis (series 2/ image 22). No intrahepatic or extrahepatic ductal dilatation.  Pancreas: Within normal limits.  Spleen: Within normal limits.  Adrenals/Urinary Tract: Adrenal glands are unremarkable.  Kidneys are within normal limits.  No hydronephrosis.  Bladder is unremarkable.  Stomach/Bowel: Stomach is unremarkable.  No evidence of bowel obstruction.  Normal appendix.  Extensive  sigmoid diverticulosis, without evidence of diverticulitis.  Vascular/Lymphatic: Atherosclerotic calcifications of the abdominal aorta and branch vessels.  No suspicious abdominopelvic lymphadenopathy.  Reproductive: Uterus is unremarkable.  Bilateral ovaries are within normal limits.  Other: No abdominopelvic ascites.  Small fat containing right inguinal hernia.  Musculoskeletal: Degenerative changes of the visualized thoracolumbar spine.  IMPRESSION: No evidence of bowel obstruction.  Normal appendix.  Extensive sigmoid diverticulosis, without evidence of diverticulitis.  No CT findings to account for the patient's low abdominal pain.  Additional stable ancillary findings as above.   Electronically Signed   By: Julian Hy M.D.   On: 03/13/2014 22:31   Dg Knee Complete 4 Views Right  03/13/2014   CLINICAL DATA:  Pain following fall  EXAM: RIGHT KNEE - COMPLETE 4+ VIEW  COMPARISON:  February 21, 2014  FINDINGS: Frontal, lateral, and bilateral oblique views were obtained. Patient is status post total knee replacement with the prosthetic components appearing well-seated. No fracture or dislocation. No joint effusion. No erosive change. Bones are osteoporotic.  IMPRESSION: Prosthetic components appear well seated. No fracture or dislocation. Bones appear somewhat osteoporotic.   Electronically Signed   By: Lowella Grip M.D.   On: 03/13/2014 19:50   Dg Knee Complete 4 Views Right  02/21/2014   CLINICAL DATA:  Status post fall after total knee replacement on 11/19, redness/swelling  EXAM: RIGHT KNEE - COMPLETE 4+ VIEW  COMPARISON:  01/03/2014  FINDINGS: Right total knee arthroplasty in satisfactory position. No evidence of hardware complication or loosening.  No fracture or dislocation is seen.  Moderate suprapatellar knee joint effusion.  IMPRESSION: Right total knee arthroplasty in satisfactory position. No evidence of complication.  Moderate suprapatellar knee joint effusion.   Electronically Signed    By: Julian Hy M.D.   On: 02/21/2014 20:00     Microbiology: Recent Results (from the past 240 hour(s))  Blood culture (routine x 2)     Status: None (Preliminary result)   Collection Time: 03/13/14  5:49 PM  Result Value Ref Range Status   Specimen Description BLOOD RIGHT ARM  Final   Special Requests   Final    BOTTLES DRAWN AEROBIC AND  ANAEROBIC 3CC BOTH BOTTLES   Culture   Final           BLOOD CULTURE RECEIVED NO GROWTH TO DATE CULTURE WILL BE HELD FOR 5 DAYS BEFORE ISSUING A FINAL NEGATIVE REPORT Performed at [facility]    Report Status PENDING  Incomplete  Blood culture (routine x 2)     Status: None (Preliminary result)   Collection Time: 03/13/14  7:04 PM  Result Value Ref Range Status   Specimen Description BLOOD LEFT ANTECUBITAL  Final   Special Requests BOTTLES DRAWN AEROBIC AND ANAEROBIC 2.5CC  Final   Culture   Final           BLOOD CULTURE RECEIVED NO GROWTH TO DATE CULTURE WILL BE HELD FOR 5 DAYS BEFORE ISSUING A FINAL NEGATIVE REPORT Performed at [facility]    Report Status PENDING  Incomplete  Clostridium Difficile by PCR     Status: None   Collection Time: 03/14/14 10:28 AM  Result Value Ref Range Status   C difficile by pcr NEGATIVE NEGATIVE Final    Comment: Performed at [facility]: Basic Metabolic Panel:  Recent Labs Lab 03/13/14 1749 03/13/14 2213 03/14/14 0314 03/15/14 0518  NA 142 142 142 143  K 2.7* 2.9* 2.8* 3.0*  CL 103 102 105 106  CO2 26  --  29 28  GLUCOSE 129* 126* 121* 113*  BUN <5* <3* <5* <5*  CREATININE 0.75 0.60 0.64 0.64  CALCIUM 9.4  --  9.0 9.1  MG  --   --  1.6 1.7   Liver Function Tests:  Recent Labs Lab 03/13/14 1749 03/14/14 0314  AST 29 25  ALT 14 12  ALKPHOS 115 101  BILITOT 0.7 0.8  PROT 7.7 6.9  ALBUMIN 4.2 4.0    Recent Labs Lab 03/13/14 1749  LIPASE 17    Recent Labs Lab 03/13/14 1749 03/14/14 0520  AMMONIA 22 17   CBC:  Recent Labs Lab  03/13/14 1749 03/13/14 2213 03/15/14 0518  WBC 8.8  --  8.4  NEUTROABS 5.9  --   --   HGB 15.4* 15.6* 16.1*  HCT 44.9 46.0 47.9*  MCV 88.9  --  90.9  PLT 221  --  202   Cardiac Enzymes:  Recent Labs Lab 03/14/14 0520  CKTOTAL 82  TROPONINI <0.03   BNP: Invalid input(s): POCBNP CBG: No results for input(s): GLUCAP in the last 168 hours.  Time coordinating discharge:  Greater than 30 minutes  Signed:  Neilani Duffee, DO [facility] Pager: [phone] 03/15/2014, 12:45 PM

## 2014-03-15 NOTE — Progress Notes (Signed)
Patient awake out of bed, Heart rate of 160 per telemetry, EKG completed per protocol, heart rate of 120, sinus tach elevated blood pressure noted treated with prn Apresoline 10 mg.

## 2014-03-16 LAB — VITAMIN B1: Vitamin B1 (Thiamine): 6 nmol/L — ABNORMAL LOW (ref 8–30)

## 2014-03-19 LAB — CULTURE, BLOOD (ROUTINE X 2): CULTURE: NO GROWTH

## 2014-03-20 LAB — URINE CULTURE
Colony Count: NO GROWTH
Culture: NO GROWTH

## 2014-03-20 LAB — CULTURE, BLOOD (ROUTINE X 2): CULTURE: NO GROWTH

## 2014-11-30 ENCOUNTER — Emergency Department (HOSPITAL_COMMUNITY): Payer: Medicare Other

## 2014-11-30 ENCOUNTER — Encounter (HOSPITAL_COMMUNITY): Payer: Self-pay | Admitting: *Deleted

## 2014-11-30 ENCOUNTER — Inpatient Hospital Stay (HOSPITAL_COMMUNITY): Payer: Medicare Other

## 2014-11-30 ENCOUNTER — Inpatient Hospital Stay (HOSPITAL_COMMUNITY)
Admission: EM | Admit: 2014-11-30 | Discharge: 2014-12-02 | DRG: 442 | Disposition: A | Discharge status: Home Health | Payer: Medicare Other | Attending: Internal Medicine | Admitting: Internal Medicine

## 2014-11-30 DIAGNOSIS — M1990 Unspecified osteoarthritis, unspecified site: Secondary | ICD-10-CM | POA: Diagnosis present

## 2014-11-30 DIAGNOSIS — Z87891 Personal history of nicotine dependence: Secondary | ICD-10-CM | POA: Diagnosis not present

## 2014-11-30 DIAGNOSIS — K219 Gastro-esophageal reflux disease without esophagitis: Secondary | ICD-10-CM | POA: Diagnosis present

## 2014-11-30 DIAGNOSIS — Z85828 Personal history of other malignant neoplasm of skin: Secondary | ICD-10-CM | POA: Diagnosis not present

## 2014-11-30 DIAGNOSIS — Z883 Allergy status to other anti-infective agents status: Secondary | ICD-10-CM

## 2014-11-30 DIAGNOSIS — Z9183 Wandering in diseases classified elsewhere: Secondary | ICD-10-CM | POA: Diagnosis not present

## 2014-11-30 DIAGNOSIS — R410 Disorientation, unspecified: Secondary | ICD-10-CM | POA: Diagnosis not present

## 2014-11-30 DIAGNOSIS — R413 Other amnesia: Secondary | ICD-10-CM | POA: Diagnosis present

## 2014-11-30 DIAGNOSIS — K831 Obstruction of bile duct: Secondary | ICD-10-CM

## 2014-11-30 DIAGNOSIS — R4182 Altered mental status, unspecified: Secondary | ICD-10-CM | POA: Diagnosis present

## 2014-11-30 DIAGNOSIS — Z79891 Long term (current) use of opiate analgesic: Secondary | ICD-10-CM | POA: Diagnosis not present

## 2014-11-30 DIAGNOSIS — I10 Essential (primary) hypertension: Secondary | ICD-10-CM | POA: Diagnosis present

## 2014-11-30 DIAGNOSIS — K7200 Acute and subacute hepatic failure without coma: Principal | ICD-10-CM | POA: Diagnosis present

## 2014-11-30 DIAGNOSIS — F419 Anxiety disorder, unspecified: Secondary | ICD-10-CM | POA: Diagnosis present

## 2014-11-30 DIAGNOSIS — Z96651 Presence of right artificial knee joint: Secondary | ICD-10-CM | POA: Diagnosis present

## 2014-11-30 DIAGNOSIS — F1029 Alcohol dependence with unspecified alcohol-induced disorder: Secondary | ICD-10-CM | POA: Diagnosis not present

## 2014-11-30 DIAGNOSIS — E872 Acidosis: Secondary | ICD-10-CM | POA: Diagnosis present

## 2014-11-30 DIAGNOSIS — G9340 Encephalopathy, unspecified: Secondary | ICD-10-CM

## 2014-11-30 DIAGNOSIS — E876 Hypokalemia: Secondary | ICD-10-CM | POA: Diagnosis present

## 2014-11-30 DIAGNOSIS — Z886 Allergy status to analgesic agent status: Secondary | ICD-10-CM | POA: Diagnosis not present

## 2014-11-30 DIAGNOSIS — Z882 Allergy status to sulfonamides status: Secondary | ICD-10-CM

## 2014-11-30 DIAGNOSIS — D126 Benign neoplasm of colon, unspecified: Secondary | ICD-10-CM | POA: Diagnosis present

## 2014-11-30 DIAGNOSIS — Z79899 Other long term (current) drug therapy: Secondary | ICD-10-CM | POA: Diagnosis not present

## 2014-11-30 DIAGNOSIS — A419 Sepsis, unspecified organism: Secondary | ICD-10-CM

## 2014-11-30 DIAGNOSIS — F1020 Alcohol dependence, uncomplicated: Secondary | ICD-10-CM | POA: Diagnosis present

## 2014-11-30 LAB — URINE MICROSCOPIC-ADD ON

## 2014-11-30 LAB — RAPID URINE DRUG SCREEN, HOSP PERFORMED
AMPHETAMINES: NOT DETECTED
BENZODIAZEPINES: NOT DETECTED
Barbiturates: NOT DETECTED
COCAINE: NOT DETECTED
OPIATES: NOT DETECTED
TETRAHYDROCANNABINOL: NOT DETECTED

## 2014-11-30 LAB — URINALYSIS, ROUTINE W REFLEX MICROSCOPIC
Bilirubin Urine: NEGATIVE
Glucose, UA: NEGATIVE mg/dL
Ketones, ur: 15 mg/dL — AB
Leukocytes, UA: NEGATIVE
Nitrite: NEGATIVE
PH: 8.5 — AB (ref 5.0–8.0)
Protein, ur: NEGATIVE mg/dL
SPECIFIC GRAVITY, URINE: 1.008 (ref 1.005–1.030)
Urobilinogen, UA: 1 mg/dL (ref 0.0–1.0)

## 2014-11-30 LAB — CBC WITH DIFFERENTIAL/PLATELET
Basophils Absolute: 0 10*3/uL (ref 0.0–0.1)
Basophils Relative: 0 %
EOS PCT: 0 %
Eosinophils Absolute: 0 10*3/uL (ref 0.0–0.7)
HEMATOCRIT: 46.4 % — AB (ref 36.0–46.0)
Hemoglobin: 16.4 g/dL — ABNORMAL HIGH (ref 12.0–15.0)
LYMPHS PCT: 21 %
Lymphs Abs: 1.8 10*3/uL (ref 0.7–4.0)
MCH: 31.7 pg (ref 26.0–34.0)
MCHC: 35.3 g/dL (ref 30.0–36.0)
MCV: 89.7 fL (ref 78.0–100.0)
MONOS PCT: 6 %
Monocytes Absolute: 0.5 10*3/uL (ref 0.1–1.0)
NEUTROS ABS: 6.3 10*3/uL (ref 1.7–7.7)
Neutrophils Relative %: 73 %
PLATELETS: 168 10*3/uL (ref 150–400)
RBC: 5.17 MIL/uL — ABNORMAL HIGH (ref 3.87–5.11)
RDW: 12.2 % (ref 11.5–15.5)
WBC: 8.7 10*3/uL (ref 4.0–10.5)

## 2014-11-30 LAB — COMPREHENSIVE METABOLIC PANEL
ALK PHOS: 80 U/L (ref 38–126)
ALT: 16 U/L (ref 14–54)
ANION GAP: 14 (ref 5–15)
AST: 31 U/L (ref 15–41)
Albumin: 4.4 g/dL (ref 3.5–5.0)
BILIRUBIN TOTAL: 1.1 mg/dL (ref 0.3–1.2)
BUN: 7 mg/dL (ref 6–20)
CALCIUM: 9.6 mg/dL (ref 8.9–10.3)
CO2: 23 mmol/L (ref 22–32)
Chloride: 104 mmol/L (ref 101–111)
Creatinine, Ser: 0.9 mg/dL (ref 0.44–1.00)
GFR calc Af Amer: >60 mL/min (ref >60)
Glucose, Bld: 123 mg/dL — ABNORMAL HIGH (ref 65–99)
Potassium: 3.4 mmol/L — ABNORMAL LOW (ref 3.5–5.1)
Sodium: 141 mmol/L (ref 135–145)
TOTAL PROTEIN: 7.6 g/dL (ref 6.5–8.1)

## 2014-11-30 LAB — AMMONIA: Ammonia: 36 umol/L — ABNORMAL HIGH (ref 9–35)

## 2014-11-30 LAB — I-STAT CG4 LACTIC ACID, ED: LACTIC ACID, VENOUS: 3.18 mmol/L — AB (ref 0.5–2.0)

## 2014-11-30 LAB — ETHANOL: Alcohol, Ethyl (B): <5 mg/dL (ref <5)

## 2014-11-30 LAB — I-STAT TROPONIN, ED: TROPONIN I, POC: 0 ng/mL (ref 0.00–0.08)

## 2014-11-30 LAB — CBG MONITORING, ED: Glucose-Capillary: 105 mg/dL — ABNORMAL HIGH (ref 65–99)

## 2014-11-30 MED ORDER — VANCOMYCIN HCL IN DEXTROSE 1-5 GM/200ML-% IV SOLN
1000.0000 mg | Freq: Once | INTRAVENOUS | Status: DC
  Filled 2014-11-30: qty 200

## 2014-11-30 MED ORDER — ADULT MULTIVITAMIN W/MINERALS CH
1.0000 | ORAL_TABLET | Freq: Every day | ORAL | Status: DC
  Administered 2014-12-01 – 2014-12-02 (×2): 1 via ORAL
  Filled 2014-11-30 (×2): qty 1

## 2014-11-30 MED ORDER — CITALOPRAM HYDROBROMIDE 20 MG PO TABS
20.0000 mg | ORAL_TABLET | Freq: Every day | ORAL | Status: DC
  Administered 2014-11-30 – 2014-12-01 (×2): 20 mg via ORAL
  Filled 2014-11-30 (×2): qty 1

## 2014-11-30 MED ORDER — FOLIC ACID 1 MG PO TABS
1.0000 mg | ORAL_TABLET | Freq: Every day | ORAL | Status: DC
  Administered 2014-12-01 – 2014-12-02 (×2): 1 mg via ORAL
  Filled 2014-11-30 (×2): qty 1

## 2014-11-30 MED ORDER — GABAPENTIN 300 MG PO CAPS
300.0000 mg | ORAL_CAPSULE | Freq: Every day | ORAL | Status: DC
  Administered 2014-11-30 – 2014-12-01 (×2): 300 mg via ORAL
  Filled 2014-11-30 (×2): qty 1

## 2014-11-30 MED ORDER — PIPERACILLIN-TAZOBACTAM 3.375 G IVPB
3.3750 g | Freq: Three times a day (TID) | INTRAVENOUS | Status: DC
  Administered 2014-12-01 – 2014-12-02 (×5): 3.375 g via INTRAVENOUS
  Filled 2014-11-30 (×7): qty 50

## 2014-11-30 MED ORDER — SODIUM CHLORIDE 0.9 % IV SOLN
INTRAVENOUS | Status: DC
  Administered 2014-11-30: 23:00:00 via INTRAVENOUS

## 2014-11-30 MED ORDER — LACTULOSE 10 GM/15ML PO SOLN
20.0000 g | Freq: Two times a day (BID) | ORAL | Status: DC
  Administered 2014-11-30 – 2014-12-02 (×4): 20 g via ORAL
  Filled 2014-11-30 (×4): qty 30

## 2014-11-30 MED ORDER — LORATADINE 10 MG PO TABS: 10.0000 mg | ORAL_TABLET | Freq: Every day | ORAL | Status: DC | PRN

## 2014-11-30 MED ORDER — SODIUM CHLORIDE 0.9 % IV SOLN
1500.0000 mg | Freq: Once | INTRAVENOUS | Status: AC
  Administered 2014-12-01: 1500 mg via INTRAVENOUS
  Filled 2014-11-30: qty 1500

## 2014-11-30 MED ORDER — POLYETHYLENE GLYCOL 3350 17 G PO PACK: 17.0000 g | PACK | Freq: Every day | ORAL | Status: DC | PRN

## 2014-11-30 MED ORDER — ENSURE ENLIVE PO LIQD
237.0000 mL | Freq: Two times a day (BID) | ORAL | Status: DC
  Administered 2014-12-01: 237 mL via ORAL

## 2014-11-30 MED ORDER — ONDANSETRON HCL 4 MG/2ML IJ SOLN
4.0000 mg | Freq: Four times a day (QID) | INTRAMUSCULAR | Status: DC | PRN
  Administered 2014-12-01: 4 mg via INTRAVENOUS
  Filled 2014-11-30 (×2): qty 2

## 2014-11-30 MED ORDER — SODIUM CHLORIDE 0.9 % IJ SOLN
3.0000 mL | Freq: Two times a day (BID) | INTRAMUSCULAR | Status: DC
  Administered 2014-12-01 – 2014-12-02 (×2): 3 mL via INTRAVENOUS

## 2014-11-30 MED ORDER — THIAMINE HCL 100 MG/ML IJ SOLN
Freq: Once | INTRAVENOUS | Status: AC
  Administered 2014-12-01: 02:00:00 via INTRAVENOUS
  Filled 2014-11-30 (×2): qty 1000

## 2014-11-30 MED ORDER — POTASSIUM CHLORIDE 20 MEQ/15ML (10%) PO SOLN
40.0000 meq | Freq: Once | ORAL | Status: AC
  Administered 2014-11-30: 40 meq via ORAL
  Filled 2014-11-30: qty 30

## 2014-11-30 MED ORDER — LORAZEPAM 2 MG/ML IJ SOLN
1.0000 mg | Freq: Four times a day (QID) | INTRAMUSCULAR | Status: DC | PRN
  Administered 2014-12-01 (×2): 1 mg via INTRAVENOUS
  Filled 2014-11-30 (×2): qty 1

## 2014-11-30 MED ORDER — TRAZODONE HCL 50 MG PO TABS
50.0000 mg | ORAL_TABLET | Freq: Every day | ORAL | Status: DC
  Administered 2014-11-30 – 2014-12-01 (×2): 50 mg via ORAL
  Filled 2014-11-30 (×2): qty 1

## 2014-11-30 MED ORDER — POLYVINYL ALCOHOL 1.4 % OP SOLN: Freq: Three times a day (TID) | OPHTHALMIC | Status: DC | PRN

## 2014-11-30 MED ORDER — SENNA 8.6 MG PO TABS
1.0000 | ORAL_TABLET | Freq: Two times a day (BID) | ORAL | Status: DC
  Administered 2014-11-30 – 2014-12-01 (×3): 8.6 mg via ORAL
  Filled 2014-11-30 (×4): qty 1

## 2014-11-30 MED ORDER — ONDANSETRON HCL 4 MG PO TABS
4.0000 mg | ORAL_TABLET | Freq: Four times a day (QID) | ORAL | Status: DC | PRN
Start: 2014-11-30 — End: 2014-12-02
  Administered 2014-12-02 (×2): 4 mg via ORAL
  Filled 2014-11-30 (×2): qty 1

## 2014-11-30 MED ORDER — VITAMIN B-1 100 MG PO TABS
100.0000 mg | ORAL_TABLET | Freq: Every day | ORAL | Status: DC
  Administered 2014-12-01 – 2014-12-02 (×2): 100 mg via ORAL
  Filled 2014-11-30 (×2): qty 1

## 2014-11-30 MED ORDER — SODIUM CHLORIDE 0.9 % IV BOLUS (SEPSIS)
1000.0000 mL | Freq: Once | INTRAVENOUS | Status: AC
  Administered 2014-11-30: 1000 mL via INTRAVENOUS

## 2014-11-30 MED ORDER — PIPERACILLIN-TAZOBACTAM 3.375 G IVPB 30 MIN
3.3750 g | Freq: Once | INTRAVENOUS | Status: AC
  Administered 2014-12-01: 3.375 g via INTRAVENOUS
  Filled 2014-11-30 (×2): qty 50

## 2014-11-30 MED ORDER — THIAMINE HCL 100 MG/ML IJ SOLN
100.0000 mg | Freq: Once | INTRAMUSCULAR | Status: AC
  Administered 2014-11-30: 100 mg via INTRAVENOUS
  Filled 2014-11-30: qty 2

## 2014-11-30 MED ORDER — VANCOMYCIN HCL IN DEXTROSE 750-5 MG/150ML-% IV SOLN
750.0000 mg | Freq: Two times a day (BID) | INTRAVENOUS | Status: DC
  Administered 2014-12-01 – 2014-12-02 (×3): 750 mg via INTRAVENOUS
  Filled 2014-11-30 (×4): qty 150

## 2014-11-30 MED ORDER — THIAMINE HCL 100 MG/ML IJ SOLN: 100.0000 mg | Freq: Every day | INTRAMUSCULAR | Status: DC

## 2014-11-30 MED ORDER — POTASSIUM CHLORIDE 20 MEQ PO PACK: 40.0000 meq | PACK | Freq: Once | ORAL | Status: DC

## 2014-11-30 MED ORDER — LORAZEPAM 1 MG PO TABS
1.0000 mg | ORAL_TABLET | Freq: Four times a day (QID) | ORAL | Status: DC | PRN
  Administered 2014-12-01: 1 mg via ORAL
  Filled 2014-11-30: qty 1

## 2014-11-30 MED ORDER — FUROSEMIDE 20 MG PO TABS: 20.0000 mg | ORAL_TABLET | Freq: Every day | ORAL | Status: DC | PRN

## 2014-11-30 MED ORDER — SENNOSIDES-DOCUSATE SODIUM 8.6-50 MG PO TABS: 2.0000 | ORAL_TABLET | Freq: Every day | ORAL | Status: DC

## 2014-11-30 NOTE — Progress Notes (Signed)
Pt arrived to unit alert and oriented x1 to self. Oriented to room, unit, and staff.  Bed in lowest position and call bell is within reach. Will continue to monitor.
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
## 2014-11-30 NOTE — Progress Notes (Signed)
ANTIBIOTIC CONSULT NOTE - INITIAL  Pharmacy Consult for vancomycin and Zosyn Indication: rule out sepsis  Allergies  Allergen Reactions  . Sulfa Antibiotics Hives and Swelling  . Ciprofloxacin Itching  . Motrin [Ibuprofen] Palpitations    Patient Measurements: Height: 5\' 7"  (170.2 cm) Weight: 200 lb (90.719 kg) IBW/kg (Calculated) : 61.6 Adjusted Body Weight:   Vital Signs: BP: 150/86 mmHg (09/15 2145) Pulse Rate: 110 (09/15 2045) Intake/Output from previous day:   Intake/Output from this shift:    Labs:  Recent Labs  11/30/14 1943  WBC 8.7  HGB 16.4*  PLT 168  CREATININE 0.90   Estimated Creatinine Clearance: 64.3 mL/min (by C-G formula based on Cr of 0.9). No results for input(s): VANCOTROUGH, VANCOPEAK, VANCORANDOM, GENTTROUGH, GENTPEAK, GENTRANDOM, TOBRATROUGH, TOBRAPEAK, TOBRARND, AMIKACINPEAK, AMIKACINTROU, AMIKACIN in the last 72 hours.   Microbiology: No results found for this or any previous visit (from the past 720 hour(s)).  Medical History: Past Medical History  Diagnosis Date  . Complication of anesthesia     "felt drunk for a week after"  . Hypertension     not being treated with medication  . Palpitations   . Anxiety   . GERD (gastroesophageal reflux disease)   . Arthritis   . Cancer     skin  . Alcohol abuse     ETOH and xanax  . Encephalopathy     Medications:  See electronic record  Assessment: 74 y/o female found wandering in the parking lot of [place] trying to get into the wrong car. She presents to the Ed with AMS. Pharmacy consulted to begin vancomycin and Zosyn to r/o sepsis. WBC and renal function are normal. Lactic acid is elevated. Concern for hx alcohol abuse but level is normal.  Goal of Therapy:  Vancomycin trough level 15-20 mcg/ml Eradication of infection  Plan:  - Vancomycin 1500 mg IV now then 750 mg IV q12h - Zosyn 3.375 g IV q8h (infuse over 4 hrs) - Monitor renal function, clinical course, and  culture data  [facility], Pharm.D., BCPS Clinical Pharmacist Pager: [phone] 11/30/2014 10:40 PM

## 2014-11-30 NOTE — Consult Note (Signed)
Neurology Consultation Reason for Consult: altered mental status Referring Physician: Laurelyn Sickle  CC: altered mental status.   History is obtained from: Patient, medical record  HPI: Denise Kelly is a 74 y.o. female with a history of several episodes of confusion that have been attributed to various causes including UTI and possible hyperammonemic encephalopathy (had an ammonia of 79 at one point in the past).   Her last episode here was 03/15/14.   Tonight, she was seen wandering in a parking lot and trying to get into the wrong car. She was brought to the emergency department where she was found to be confused lab workup revealed elevated lactate. Neurology is being consulted for altered mental status.  ROS:  Unable to obtain due to altered mental status.   Past Medical History  Diagnosis Date  . Complication of anesthesia     "felt drunk for a week after"  . Hypertension     not being treated with medication  . Palpitations   . Anxiety   . GERD (gastroesophageal reflux disease)   . Arthritis   . Cancer     skin  . Alcohol abuse     ETOH and xanax  . Encephalopathy      No family history on file. and unable to obtain due to altered mental status   Social History:  reports that she has quit smoking. She has never used smokeless tobacco. She reports that she drinks alcohol. She reports that she does not use illicit drugs.   Exam: Current vital signs: BP 140/87 mmHg  Pulse 110  Resp 28  Ht 5\' 7"  (1.702 m)  Wt 90.719 kg (200 lb)  BMI 31.32 kg/m2  SpO2 99% Vital signs in last 24 hours: Pulse Rate:  [104-116] 110 (09/15 2045) Resp:  [18-28] 28 (09/15 2038) BP: (140-182)/(87-104) 140/87 mmHg (09/15 2045) SpO2:  [97 %-100 %] 99 % (09/15 2045) Weight:  [90.719 kg (200 lb)] 90.719 kg (200 lb) (09/15 1801)  Physical Exam  Constitutional: Appears well-developed and well-nourished.  Psych: Appears anxious, tearful Eyes: No scleral injection HENT: No OP  obstrucion Head: Normocephalic.  Cardiovascular: Normal rate and regular rhythm.  Respiratory: Effort normal and breath sounds normal to anterior ascultation GI: Soft.  No distension. There is no tenderness.  Skin: WDI  Neuro: Mental Status: Patient is awake, she is able to tell me her name, but answers "I don't know" to most other questions.  Seh follows some commands, but not others.  Cranial Nerves: II: Blinks to threat bilaterally. Pupils are equal, round, and reactive to light.   III,IV, VI: EOMI without ptosis or diploplia.  V: Facial sensation is symmetric to temperature VII: Facial movement is symmetric.  VIII: hearing is intact to voice X: Uvula elevates symmetrically XI: Shoulder shrug is symmetric. XII: tongue is midline without atrophy or fasciculations.  Motor: Tone is normal. Bulk is normal. 5/5 strength was present in all four extremities.  Sensory: Sensation is symmetric to light touch DTR: 2+ and symmetric.  Cerebellar: No clear ataxia, but participates less well on the left, unclear if voluntary.   I have reviewed labs in epic and the results pertinent to this consultation are: cmp - unremarkable nml ammonia Lactic acidosis  I have reviewed the images obtained: CT head - negative  Impression: 74 yo F with a history of recurrent episodes of altered mental status of unclear etiology. She is currently responsive though still confused. There is discussion in the past of "memory gaps".  There has been question of alcohol abuse in the past. She does have an elevated lactic acid and post-ictal state would be one possibility(with unwitnessed seizure prior to her being found). There is no clear focal lesion to make me think that this is stroke, but an MRI to [facility] for this or other changes would be prudent. Serotonin syndrome would be another possibility and if she improves along the same time course, may want to limit to one serotonergic medication(currently on zofran,  trazodone, and celexa).   Recommendations: 1) MRI brain 2) Thiamine 100mg  daily.  3) EEG 4) will continue to follow.  5) would hold serotonergic medications for now.  Roland Rack, MD [facility] [phone]  If 7pm- 7am, please page neurology on call as listed in [place].

## 2014-11-30 NOTE — H&P (Signed)
[facility] History and Physical  Denise Kelly LKG:[id number] DOB: 1940-10-21 DOA: 11/30/2014  Referring physician: Leo Grosser, MD PCP: Jonathon Bellows, MD   Chief Complaint: Altered mental Status  HPI: Denise Kelly is a 74 y.o. female with history of Alcohol dependence GERD HTN presents to the ED with altered mental status. Patient is not able to provide a history. She was found wandering around in the pasrking lot at [facility] trying to get into the wrong car. She was obviously confused. She was last discharged on 03/15/14 with similar presentation of encephalopathy. She was thought at that time to have a UTI and was treated. On discharge she had cleared and had a Ammonia of 17 today she has a ammonia of 36 and a lactate of 3.18   Review of Systems:  Patient is not able to provide a ROS.   Past Medical History  Diagnosis Date  . Complication of anesthesia     "felt drunk for a week after"  . Hypertension     not being treated with medication  . Palpitations   . Anxiety   . GERD (gastroesophageal reflux disease)   . Arthritis   . Cancer     skin  . Alcohol abuse     ETOH and xanax  . Encephalopathy    Past Surgical History  Procedure Laterality Date  . Tonsillectomy    . Tubal ligation    . 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 Breast surgery      2 breast biopsies  . Eye surgery Bilateral     cataracts  . Colonoscopy    . Total knee arthroplasty Right 01/02/2014    Dr Ronnie Derby  . Total knee arthroplasty Right 01/02/2014    Procedure: TOTAL KNEE ARTHROPLASTY;  Surgeon: [name], MD;  Location: [facility];  Service: Orthopedics;  Laterality: Right;  . Joint replacement Left 09/2009    knee  . Joint replacement Right 01/02/2014   Social History:  reports that she has quit smoking. She has never used smokeless tobacco. She reports that she drinks alcohol. She reports that she does not use illicit drugs.  Allergies  Allergen Reactions  . Sulfa  Antibiotics Hives and Swelling  . Ciprofloxacin Itching  . Motrin [Ibuprofen] Palpitations    No family history on file.   Prior to Admission medications   Medication Sig Start Date End Date Taking? Authorizing Provider  acetaminophen (TYLENOL) 500 MG tablet Take 1,000 mg by mouth daily as needed for mild pain.    Historical Provider, MD  amoxicillin (AMOXIL) 500 MG capsule Take 1 capsule (500 mg total) by mouth every 8 (eight) hours. 03/15/14   [name], MD  citalopram (CELEXA) 20 MG tablet Take 1 tablet (20 mg total) by mouth at bedtime. 02/24/14   [name], MD  feeding supplement, ENSURE COMPLETE, (ENSURE COMPLETE) LIQD Take 237 mLs by mouth 2 (two) times daily between meals. 03/15/14   [name], MD  furosemide (LASIX) 20 MG tablet Take 20 mg by mouth daily as needed for edema.  11/01/13   Historical Provider, MD  gabapentin (NEURONTIN) 100 MG capsule Take 300 mg by mouth at bedtime.  10/31/13   Historical Provider, MD  hydrOXYzine (ATARAX/VISTARIL) 25 MG tablet Take 50 mg by mouth at bedtime.  08/12/13   Historical Provider, MD  loratadine (CLARITIN) 10 MG tablet Take 10 mg by mouth 4 (four) times daily as needed for itching.  Historical Provider, MD  methocarbamol (ROBAXIN) 500 MG tablet Take 1-2 tablets (500-1,000 mg total) by mouth every 6 (six) hours as needed for muscle spasms. Patient not taking: Reported on 02/21/2014 01/05/14   Carlynn Spry, PA-C  ondansetron (ZOFRAN-ODT) 4 MG disintegrating tablet Take 4 mg by mouth every 6 (six) hours as needed for nausea.  10/31/13   Historical Provider, MD  oxyCODONE (OXY IR/ROXICODONE) 5 MG immediate release tablet Take 1 tablet (5 mg total) by mouth every 6 (six) hours as needed for moderate pain. 03/15/14   [name], MD  Polyethyl Glycol-Propyl Glycol (SYSTANE OP) Apply 1 drop to eye 3 (three) times daily as needed (dry eyes).    Historical Provider, MD  polyethylene glycol (MIRALAX / GLYCOLAX) packet Take 17 g by mouth daily as needed for  moderate constipation.     Historical Provider, MD  potassium chloride SA (K-DUR,KLOR-CON) 20 MEQ tablet Take 1 tablet (20 mEq total) by mouth 2 (two) times daily. 01/20/14   Carlynn Spry, PA-C  senna-docusate (SENOKOT-S) 8.6-50 MG per tablet Take 2 tablets by mouth at bedtime.    Historical Provider, MD  traZODone (DESYREL) 50 MG tablet Take 1 tablet (50 mg total) by mouth at bedtime. 02/24/14   [name], MD   Physical Exam: Filed Vitals:   11/30/14 1800 11/30/14 1801 11/30/14 2038  BP: 182/104 182/104 176/91  Pulse: 116 113 104  Resp:  18 28  Height:  5\' 7"  (1.702 m)   Weight:  90.719 kg (200 lb)   SpO2: 97% 100% 99%    Wt Readings from Last 3 Encounters:  11/30/14 90.719 kg (200 lb)  03/15/14 90.8 kg (200 lb 2.8 oz)  02/21/14 90.719 kg (200 lb)    General:  Appears anxious agitated Eyes: normal lids, irises & conjunctiva ENT: grossly normal hearing, lips & tongue Neck: no LAD, masses or thyromegaly Cardiovascular: Tachy S1S2 normal, no m/r/g. No LE edema. Respiratory: CTA bilaterally, no w/r/r. Abdomen: soft, ntnd Skin: no rash or induration seen on limited exam Musculoskeletal: grossly normal tone BUE/BLE Psychiatric: appears anxious Neurologic: moving all four extremities.          Labs on Admission:  Basic Metabolic Panel:  Recent Labs Lab 11/30/14 1943  NA 141  K 3.4*  CL 104  CO2 23  GLUCOSE 123*  BUN 7  CREATININE 0.90  CALCIUM 9.6   Liver Function Tests:  Recent Labs Lab 11/30/14 1943  AST 31  ALT 16  ALKPHOS 80  BILITOT 1.1  PROT 7.6  ALBUMIN 4.4   No results for input(s): LIPASE, AMYLASE in the last 168 hours.  Recent Labs Lab 11/30/14 1943  AMMONIA 36*   CBC:  Recent Labs Lab 11/30/14 1943  WBC 8.7  NEUTROABS 6.3  HGB 16.4*  HCT 46.4*  MCV 89.7  PLT 168   Cardiac Enzymes: No results for input(s): CKTOTAL, CKMB, CKMBINDEX, TROPONINI in the last 168 hours.  BNP (last 3 results) No results for input(s): BNP in the last  8760 hours.  ProBNP (last 3 results)  Recent Labs  02/21/14 1835  PROBNP 166.9*    CBG:  Recent Labs Lab 11/30/14 2035  GLUCAP 105*    Radiological Exams on Admission: Ct Head Wo Contrast  11/30/2014   CLINICAL DATA:  Altered mental status/confusion  EXAM: CT HEAD WITHOUT CONTRAST  TECHNIQUE: Contiguous axial images were obtained from the base of the skull through the vertex without intravenous contrast.  COMPARISON:  March 13, 2014  FINDINGS: There  is mild diffuse atrophy. 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 The appearance in this area is stable. There is no intracranial mass, acute hemorrhage, extra-axial fluid collection, or midline shift. There is patchy small vessel disease in the centra semiovale bilaterally, stable in appearance. No acute infarct evident. The bony calvarium appears intact. Visualized mastoid air cells are clear. There is rightward deviation of the nasal septum.  IMPRESSION: Artifact from prior left-sided cavernous sinus-carotid fistula therapy. Atrophy with patchy periventricular small vessel disease 3, stable. No demonstrable mass, hemorrhage, or acute appearing infarct.   Electronically Signed   By: Lowella Grip III M.D.   On: 11/30/2014 20:32   Dg Chest Port 1 View  11/30/2014   CLINICAL DATA:  Alcohol abuse. Altered mental status. Right upper quadrant pain.  EXAM: PORTABLE CHEST - 1 VIEW  COMPARISON:  Chest radiograph 03/13/2014  FINDINGS: Normal cardiac and mediastinal contours. Heterogeneous opacities within the bilateral lower lobes. No pleural effusion or pneumothorax. Low lung volumes.  IMPRESSION: Low lung volumes with heterogeneous opacities in the bilateral lower lobes favored to represent atelectasis.   Electronically Signed   By: Lovey Newcomer M.D.   On: 11/30/2014 19:39      Assessment/Plan Active Problems:   Essential hypertension, benign   Altered mental status   Alcohol dependence    Hypokalemia   1. Altered Mental Status -unclear etiology has had multiple episodes -CT scan was clear -will have neurology see the patient and may need psych assessment -as noted elevated lactate however there is no elevation of WBC ?infection vs ?seizure (less likely) -will start sepsis protocol and start on empiric antibiotics until etiology is more clear -EEG per neurology along with MRI Brain Patient started on thiamine  2. Alcohol Dependence -unknown when her last drink was so will monitor for withdrawal -started on withdrawal protocol  3. HTN -will monitor pressures currently elevated but she is agitated -by her med list she is not on any antihypertensives  4. Hypokalemia -will give one time dose of Klor     Code Status: full code (must indicate code status--if unknown or must be presumed, indicate so) DVT Prophylaxis:SCD Family Communication: none (indicate person spoken with, if applicable, with phone number if by telephone) Disposition Plan: home (indicate anticipated LOS)    [facility] Pager [phone]

## 2014-11-30 NOTE — Progress Notes (Signed)
Received report Ellison Hughs, RN in ED for transfer to [phone].

## 2014-11-30 NOTE — ED Provider Notes (Signed)
CSN: [id number]     Arrival date & time 11/30/14  1750 History   First MD Initiated Contact with Patient 11/30/14 1753     Chief Complaint  Patient presents with  . Altered Mental Status     (Consider location/radiation/quality/duration/timing/severity/associated sxs/prior Treatment) Patient is a 74 y.o. female presenting with altered mental status. The history is provided by the EMS personnel and a relative.  Altered Mental Status Presenting symptoms: behavior changes, confusion, disorientation and memory loss   Severity:  Severe Most recent episode:  Today Episode history:  Continuous Timing:  Constant Progression:  Unchanged Chronicity:  Recurrent Context: alcohol use   Context: not a recent change in medication     Past Medical History  Diagnosis Date  . Complication of anesthesia     "felt drunk for a week after"  . Hypertension     not being treated with medication  . Palpitations   . Anxiety   . GERD (gastroesophageal reflux disease)   . Arthritis   . Cancer     skin  . Alcohol abuse     ETOH and xanax  . Encephalopathy    Past Surgical History  Procedure Laterality Date  . Tonsillectomy    . Tubal ligation    . 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 Breast surgery      2 breast biopsies  . Eye surgery Bilateral     cataracts  . Colonoscopy    . Total knee arthroplasty Right 01/02/2014    Dr Ronnie Derby  . Total knee arthroplasty Right 01/02/2014    Procedure: TOTAL KNEE ARTHROPLASTY;  Surgeon: [name], MD;  Location: [facility];  Service: Orthopedics;  Laterality: Right;  . Joint replacement Left 09/2009    knee  . Joint replacement Right 01/02/2014   No family history on file. Social History  Substance Use Topics  . Smoking status: Former Research scientist (life sciences)  . Smokeless tobacco: Never Used     Comment: quit smoking many years ago   . Alcohol Use: Yes   OB History    No data available     Review of Systems  Unable to perform ROS: Mental status change   Psychiatric/Behavioral: Positive for memory loss and confusion.      Allergies  Sulfa antibiotics; Ciprofloxacin; and Motrin  Home Medications   Prior to Admission medications   Medication Sig Start Date End Date Taking? Authorizing Provider  acetaminophen (TYLENOL) 500 MG tablet Take 1,000 mg by mouth daily as needed for mild pain.    Historical Provider, MD  amoxicillin (AMOXIL) 500 MG capsule Take 1 capsule (500 mg total) by mouth every 8 (eight) hours. 03/15/14   [name], MD  citalopram (CELEXA) 20 MG tablet Take 1 tablet (20 mg total) by mouth at bedtime. 02/24/14   [name], MD  feeding supplement, ENSURE COMPLETE, (ENSURE COMPLETE) LIQD Take 237 mLs by mouth 2 (two) times daily between meals. 03/15/14   [name], MD  furosemide (LASIX) 20 MG tablet Take 20 mg by mouth daily as needed for edema.  11/01/13   Historical Provider, MD  gabapentin (NEURONTIN) 100 MG capsule Take 300 mg by mouth at bedtime.  10/31/13   Historical Provider, MD  hydrOXYzine (ATARAX/VISTARIL) 25 MG tablet Take 50 mg by mouth at bedtime.  08/12/13   Historical Provider, MD  loratadine (CLARITIN) 10 MG tablet Take 10 mg by mouth 4 (four) times daily as needed for itching.    Historical Provider,  MD  methocarbamol (ROBAXIN) 500 MG tablet Take 1-2 tablets (500-1,000 mg total) by mouth every 6 (six) hours as needed for muscle spasms. Patient not taking: Reported on 02/21/2014 01/05/14   Carlynn Spry, PA-C  ondansetron (ZOFRAN-ODT) 4 MG disintegrating tablet Take 4 mg by mouth every 6 (six) hours as needed for nausea.  10/31/13   Historical Provider, MD  oxyCODONE (OXY IR/ROXICODONE) 5 MG immediate release tablet Take 1 tablet (5 mg total) by mouth every 6 (six) hours as needed for moderate pain. 03/15/14   [name], MD  Polyethyl Glycol-Propyl Glycol (SYSTANE OP) Apply 1 drop to eye 3 (three) times daily as needed (dry eyes).    Historical Provider, MD  polyethylene glycol (MIRALAX / GLYCOLAX) packet Take 17 g by  mouth daily as needed for moderate constipation.     Historical Provider, MD  potassium chloride SA (K-DUR,KLOR-CON) 20 MEQ tablet Take 1 tablet (20 mEq total) by mouth 2 (two) times daily. 01/20/14   Carlynn Spry, PA-C  senna-docusate (SENOKOT-S) 8.6-50 MG per tablet Take 2 tablets by mouth at bedtime.    Historical Provider, MD  traZODone (DESYREL) 50 MG tablet Take 1 tablet (50 mg total) by mouth at bedtime. 02/24/14   [name], MD   BP 182/104 mmHg  Pulse 113  Resp 18  Ht 5\' 7"  (1.702 m)  Wt 200 lb (90.719 kg)  BMI 31.32 kg/m2  SpO2 100% Physical Exam  Constitutional: She appears well-developed and well-nourished. She is uncooperative. No distress.  HENT:  Head: Normocephalic and atraumatic.  Eyes: Conjunctivae are normal.  Neck: Neck supple. No tracheal deviation present.  Cardiovascular: Normal rate, regular rhythm and normal heart sounds.   Pulmonary/Chest: Effort normal and breath sounds normal. No respiratory distress.  Abdominal: Soft. She exhibits no distension. There is no tenderness.  Neurological: She is alert. She is disoriented. No cranial nerve deficit. GCS eye subscore is 4. GCS verbal subscore is 3. GCS motor subscore is 6.  Skin: Skin is warm and dry.  Psychiatric: Her mood appears anxious. Her affect is labile. Her speech is tangential and slurred. She is agitated and combative. Cognition and memory are impaired.    ED Course  Procedures (including critical care time) Labs Review Labs Reviewed  CBC WITH DIFFERENTIAL/PLATELET - Abnormal; Notable for the following:    RBC 5.17 (*)    Hemoglobin 16.4 (*)    HCT 46.4 (*)    All other components within normal limits  URINALYSIS, ROUTINE W REFLEX MICROSCOPIC (NOT AT [facility]) - Abnormal; Notable for the following:    pH 8.5 (*)    Hgb urine dipstick SMALL (*)    Ketones, ur 15 (*)    All other components within normal limits  COMPREHENSIVE METABOLIC PANEL - Abnormal; Notable for the following:    Potassium 3.4 (*)     Glucose, Bld 123 (*)    All other components within normal limits  AMMONIA - Abnormal; Notable for the following:    Ammonia 36 (*)    All other components within normal limits  CBC WITH DIFFERENTIAL/PLATELET - Abnormal; Notable for the following:    WBC 11.5 (*)    Hemoglobin 16.5 (*)    HCT 46.1 (*)    Monocytes Absolute 1.1 (*)    All other components within normal limits  COMPREHENSIVE METABOLIC PANEL - Abnormal; Notable for the following:    Glucose, Bld 121 (*)    Total Bilirubin 1.5 (*)    GFR calc non Af Wyvonnia Lora  56 (*)    All other components within normal limits  LACTIC ACID, PLASMA - Abnormal; Notable for the following:    Lactic Acid, Venous 3.5 (*)    All other components within normal limits  APTT - Abnormal; Notable for the following:    aPTT 23 (*)    All other components within normal limits  I-STAT CG4 LACTIC ACID, ED - Abnormal; Notable for the following:    Lactic Acid, Venous 3.18 (*)    All other components within normal limits  CBG MONITORING, ED - Abnormal; Notable for the following:    Glucose-Capillary 105 (*)    All other components within normal limits  CULTURE, BLOOD (ROUTINE X 2)  CULTURE, BLOOD (ROUTINE X 2)  ETHANOL  URINE RAPID DRUG SCREEN, HOSP PERFORMED  URINE MICROSCOPIC-ADD ON  PROTIME-INR  TSH  LACTIC ACID, PLASMA  PROCALCITONIN  HEMOGLOBIN A1C  I-STAT TROPOININ, ED    Imaging Review Ct Head Wo Contrast  11/30/2014   CLINICAL DATA:  Altered mental status/confusion  EXAM: CT HEAD WITHOUT CONTRAST  TECHNIQUE: Contiguous axial images were obtained from the base of the skull through the vertex without intravenous contrast.  COMPARISON:  March 13, 2014  FINDINGS: There is mild diffuse atrophy. 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 The appearance in this area is stable. There is no intracranial mass, acute hemorrhage, extra-axial fluid collection, or midline shift. There is patchy small  vessel disease in the centra semiovale bilaterally, stable in appearance. No acute infarct evident. The bony calvarium appears intact. Visualized mastoid air cells are clear. There is rightward deviation of the nasal septum.  IMPRESSION: Artifact from prior left-sided cavernous sinus-carotid fistula therapy. Atrophy with patchy periventricular small vessel disease 3, stable. No demonstrable mass, hemorrhage, or acute appearing infarct.   Electronically Signed   By: Lowella Grip III M.D.   On: 11/30/2014 20:32   Dg Chest Port 1 View  12/01/2014   CLINICAL DATA:  Sepsis.  EXAM: PORTABLE CHEST - 1 VIEW  COMPARISON:  11/30/2014 at 19:19 p.m.  FINDINGS: Lungs are hypoinflated without focal consolidation or effusion. Previous noted mild bibasilar density not well seen. Borderline cardiomegaly unchanged. Remainder of the exam is unchanged.  IMPRESSION: Hypoinflation without acute cardiopulmonary disease.   Electronically Signed   By: Marin Olp M.D.   On: 12/01/2014 00:16   Dg Chest Port 1 View  11/30/2014   CLINICAL DATA:  Alcohol abuse. Altered mental status. Right upper quadrant pain.  EXAM: PORTABLE CHEST - 1 VIEW  COMPARISON:  Chest radiograph 03/13/2014  FINDINGS: Normal cardiac and mediastinal contours. Heterogeneous opacities within the bilateral lower lobes. No pleural effusion or pneumothorax. Low lung volumes.  IMPRESSION: Low lung volumes with heterogeneous opacities in the bilateral lower lobes favored to represent atelectasis.   Electronically Signed   By: Lovey Newcomer M.D.   On: 11/30/2014 19:39   I have personally reviewed and evaluated these images and lab results as part of my medical decision-making.   EKG Interpretation   Date/Time:  Thursday November 30 2014 18:02:38 EDT Ventricular Rate:  114 PR Interval:  159 QRS Duration: 102 QT Interval:  362 QTC Calculation: 498 R Axis:   -65 Text Interpretation:  Sinus tachycardia Incomplete RBBB and LAFB RSR' in  V1 or V2, right VCD  or RVH Borderline prolonged QT interval Confirmed by  Lyly Canizales MD, Ayumi Wangerin ([id number]) on 11/30/2014 7:05:20 PM      MDM   Final diagnoses:  Acute encephalopathy    74 y.o. female presents with recurrent episode of encephalopathy of unclear etiology. She was found altered in a parking lot. Heavy EtOH use normally per her daughter who helps provide the history. Previous admissions Pt had ongoing mental status changes thought to be secondary to infection, withdrawal and possibly 2/2 ambien use.   No toxicology, traumatic, metabolic, environmental, or infectious etiology identified. Pt persistently altered from baseline. Possible withdrawal symptoms vs wernicke's encephalopathy or dehydration with elevation of lactic acid, provided IV thiamine and fluids. Afebrile and hemodynamically stable. No tremor or signs of impending seizure. Hospitalist was consulted for admission and will see the patient in the emergency department.    Leo Grosser, MD 12/01/14 [phone]

## 2014-12-01 ENCOUNTER — Inpatient Hospital Stay (HOSPITAL_COMMUNITY): Payer: Medicare Other

## 2014-12-01 DIAGNOSIS — E876 Hypokalemia: Secondary | ICD-10-CM

## 2014-12-01 DIAGNOSIS — F1020 Alcohol dependence, uncomplicated: Secondary | ICD-10-CM

## 2014-12-01 DIAGNOSIS — I10 Essential (primary) hypertension: Secondary | ICD-10-CM

## 2014-12-01 DIAGNOSIS — R410 Disorientation, unspecified: Secondary | ICD-10-CM

## 2014-12-01 LAB — CBC WITH DIFFERENTIAL/PLATELET
BASOS PCT: 0 %
Basophils Absolute: 0 10*3/uL (ref 0.0–0.1)
EOS ABS: 0 10*3/uL (ref 0.0–0.7)
EOS PCT: 0 %
HCT: 46.1 % — ABNORMAL HIGH (ref 36.0–46.0)
HEMOGLOBIN: 16.5 g/dL — AB (ref 12.0–15.0)
LYMPHS ABS: 3.1 10*3/uL (ref 0.7–4.0)
Lymphocytes Relative: 27 %
MCH: 32.3 pg (ref 26.0–34.0)
MCHC: 35.8 g/dL (ref 30.0–36.0)
MCV: 90.2 fL (ref 78.0–100.0)
MONOS PCT: 9 %
Monocytes Absolute: 1.1 10*3/uL — ABNORMAL HIGH (ref 0.1–1.0)
NEUTROS PCT: 64 %
Neutro Abs: 7.3 10*3/uL (ref 1.7–7.7)
PLATELETS: 163 10*3/uL (ref 150–400)
RBC: 5.11 MIL/uL (ref 3.87–5.11)
RDW: 12.1 % (ref 11.5–15.5)
WBC: 11.5 10*3/uL — AB (ref 4.0–10.5)

## 2014-12-01 LAB — COMPREHENSIVE METABOLIC PANEL
ALBUMIN: 4.2 g/dL (ref 3.5–5.0)
ALK PHOS: 78 U/L (ref 38–126)
ALT: 17 U/L (ref 14–54)
ANION GAP: 12 (ref 5–15)
AST: 38 U/L (ref 15–41)
BUN: 7 mg/dL (ref 6–20)
CALCIUM: 9.5 mg/dL (ref 8.9–10.3)
CHLORIDE: 105 mmol/L (ref 101–111)
CO2: 22 mmol/L (ref 22–32)
Creatinine, Ser: 0.98 mg/dL (ref 0.44–1.00)
GFR calc non Af Amer: 56 mL/min — ABNORMAL LOW (ref >60)
GLUCOSE: 121 mg/dL — AB (ref 65–99)
POTASSIUM: 3.6 mmol/L (ref 3.5–5.1)
SODIUM: 139 mmol/L (ref 135–145)
Total Bilirubin: 1.5 mg/dL — ABNORMAL HIGH (ref 0.3–1.2)
Total Protein: 7.2 g/dL (ref 6.5–8.1)

## 2014-12-01 LAB — LACTIC ACID, PLASMA
LACTIC ACID, VENOUS: 3.8 mmol/L — AB (ref 0.5–2.0)
Lactic Acid, Venous: 3.5 mmol/L (ref 0.5–2.0)

## 2014-12-01 LAB — PROTIME-INR
INR: 1.08 (ref 0.00–1.49)
PROTHROMBIN TIME: 14.2 s (ref 11.6–15.2)

## 2014-12-01 LAB — APTT: APTT: 23 s — AB (ref 24–37)

## 2014-12-01 LAB — PROCALCITONIN

## 2014-12-01 LAB — TSH: TSH: 2.259 u[IU]/mL (ref 0.350–4.500)

## 2014-12-01 MED ORDER — SODIUM CHLORIDE 0.9 % IV BOLUS (SEPSIS)
500.0000 mL | Freq: Once | INTRAVENOUS | Status: AC
  Administered 2014-12-01: 500 mL via INTRAVENOUS

## 2014-12-01 MED ORDER — ACETAMINOPHEN 325 MG PO TABS
650.0000 mg | ORAL_TABLET | Freq: Four times a day (QID) | ORAL | Status: DC | PRN
  Administered 2014-12-01 – 2014-12-02 (×3): 650 mg via ORAL
  Filled 2014-12-01 (×2): qty 2

## 2014-12-01 NOTE — Progress Notes (Signed)
[facility] PROGRESS NOTE  Denise Kelly JAS:[id number] DOB: Nov 30, 1940 DOA: 11/30/2014 PCP: Jonathon Bellows, MD  Assessment/Plan: 1. Acute mental status 1. Unclear etiology 2. Consideration for possible seizures. Neurology consulted. MRI pending still 3. Pt's mental status seems to be improving overnight 4. No signs of acute infection 2. ETOH dependence 1. ETOH neg 3. HTN 1. BP stable 2. Cont monitor 4. Hypokalemia 1. normalized 5. DVT prophylaxis 1. SCD's  Code Status: Full Family Communication: Pt in room (indicate person spoken with, relationship, and if by phone, the number) Disposition Plan: Pending  Consultants:  Neurology  Procedures:    Antibiotics:   (indicate start date, and stop date if known)  HPI/Subjective: Feels better today. Eager to go home  Objective: Filed Vitals:   12/01/14 0545 12/01/14 1141 12/01/14 1418 12/01/14 1842  BP: 168/99 138/84 149/94 152/95  Pulse: 95 102 109 111  Temp: 98.2 F (36.8 C) 98.1 F (36.7 C) 98.6 F (37 C) 98.4 F (36.9 C)  TempSrc: Oral Oral Oral Oral  Resp: 24 23 18 16   Height:      Weight:      SpO2: 99% 99% 97% 97%    Intake/Output Summary (Last 24 hours) at 12/01/14 1856 Last data filed at 12/01/14 1823  Gross per 24 hour  Intake 996.67 ml  Output      0 ml  Net 996.67 ml   Filed Weights   11/30/14 1801 11/30/14 2255  Weight: 90.719 kg (200 lb) 89 kg (196 lb 3.4 oz)    Exam:   General:  Awake, in nad  Cardiovascular: regular, s1, s2  Respiratory: normal resp effort, no wheezing  Abdomen: soft,nondistended  Musculoskeletal: perfused,no clubbing   Data Reviewed: Basic Metabolic Panel:  Recent Labs Lab 11/30/14 1943 11/30/14 2330  NA 141 139  K 3.4* 3.6  CL 104 105  CO2 23 22  GLUCOSE 123* 121*  BUN 7 7  CREATININE 0.90 0.98  CALCIUM 9.6 9.5   Liver Function Tests:  Recent Labs Lab 11/30/14 1943 11/30/14 2330  AST 31 38  ALT 16 17  ALKPHOS 80 78  BILITOT 1.1  1.5*  PROT 7.6 7.2  ALBUMIN 4.4 4.2   No results for input(s): LIPASE, AMYLASE in the last 168 hours.  Recent Labs Lab 11/30/14 1943  AMMONIA 36*   CBC:  Recent Labs Lab 11/30/14 1943 11/30/14 2330  WBC 8.7 11.5*  NEUTROABS 6.3 7.3  HGB 16.4* 16.5*  HCT 46.4* 46.1*  MCV 89.7 90.2  PLT 168 163   Cardiac Enzymes: No results for input(s): CKTOTAL, CKMB, CKMBINDEX, TROPONINI in the last 168 hours. BNP (last 3 results) No results for input(s): BNP in the last 8760 hours.  ProBNP (last 3 results)  Recent Labs  02/21/14 1835  PROBNP 166.9*    CBG:  Recent Labs Lab 11/30/14 2035  GLUCAP 105*    Recent Results (from the past 240 hour(s))  Culture, blood (x 2)     Status: None (Preliminary result)   Collection Time: 11/30/14 11:30 PM  Result Value Ref Range Status   Specimen Description BLOOD RIGHT ANTECUBITAL  Final   Special Requests BOTTLES DRAWN AEROBIC ONLY 5CC  Final   Culture PENDING  Incomplete   Report Status PENDING  Incomplete     Studies: Ct Head Wo Contrast  11/30/2014   CLINICAL DATA:  Altered mental status/confusion  EXAM: CT HEAD WITHOUT CONTRAST  TECHNIQUE: Contiguous axial images were obtained from the base of the skull through  the vertex without intravenous contrast.  COMPARISON:  March 13, 2014  FINDINGS: There is mild diffuse atrophy. 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 The appearance in this area is stable. There is no intracranial mass, acute hemorrhage, extra-axial fluid collection, or midline shift. There is patchy small vessel disease in the centra semiovale bilaterally, stable in appearance. No acute infarct evident. The bony calvarium appears intact. Visualized mastoid air cells are clear. There is rightward deviation of the nasal septum.  IMPRESSION: Artifact from prior left-sided cavernous sinus-carotid fistula therapy. Atrophy with patchy periventricular small vessel disease 3,  stable. No demonstrable mass, hemorrhage, or acute appearing infarct.   Electronically Signed   By: Lowella Grip III M.D.   On: 11/30/2014 20:32   Dg Chest Port 1 View  12/01/2014   CLINICAL DATA:  Sepsis.  EXAM: PORTABLE CHEST - 1 VIEW  COMPARISON:  11/30/2014 at 19:19 p.m.  FINDINGS: Lungs are hypoinflated without focal consolidation or effusion. Previous noted mild bibasilar density not well seen. Borderline cardiomegaly unchanged. Remainder of the exam is unchanged.  IMPRESSION: Hypoinflation without acute cardiopulmonary disease.   Electronically Signed   By: Marin Olp M.D.   On: 12/01/2014 00:16   Dg Chest Port 1 View  11/30/2014   CLINICAL DATA:  Alcohol abuse. Altered mental status. Right upper quadrant pain.  EXAM: PORTABLE CHEST - 1 VIEW  COMPARISON:  Chest radiograph 03/13/2014  FINDINGS: Normal cardiac and mediastinal contours. Heterogeneous opacities within the bilateral lower lobes. No pleural effusion or pneumothorax. Low lung volumes.  IMPRESSION: Low lung volumes with heterogeneous opacities in the bilateral lower lobes favored to represent atelectasis.   Electronically Signed   By: Lovey Newcomer M.D.   On: 11/30/2014 19:39    Scheduled Meds: . citalopram  20 mg Oral QHS  . feeding supplement (ENSURE ENLIVE)  237 mL Oral BID BM  . folic acid  1 mg Oral Daily  . gabapentin  300 mg Oral QHS  . lactulose  20 g Oral BID  . multivitamin with minerals  1 tablet Oral Daily  . piperacillin-tazobactam (ZOSYN)  IV  3.375 g Intravenous 3 times per day  . senna  1 tablet Oral BID  . sodium chloride  3 mL Intravenous Q12H  . thiamine  100 mg Oral Daily   Or  . thiamine  100 mg Intravenous Daily  . traZODone  50 mg Oral QHS  . vancomycin  750 mg Intravenous Q12H   Continuous Infusions: . sodium chloride 50 mL/hr at 11/30/14 2250    Active Problems:   Essential hypertension, benign   Altered mental status   Alcohol dependence   Hypokalemia   Altered mental state   [facility],  [facility] Pager [phone]. If 7PM-7AM, please contact night-coverage at [email or web address], password [facility] 12/01/2014, 6:56 PM  LOS: 1 day

## 2014-12-01 NOTE — Progress Notes (Signed)
EEG Completed; Results Pending  

## 2014-12-01 NOTE — Progress Notes (Signed)
Initial Nutrition Assessment  DOCUMENTATION CODES:   Obesity unspecified  INTERVENTION:   Continue Ensure Enlive po BID, each supplement provides 350 kcal and 20 grams of protein  NUTRITION DIAGNOSIS:   Inadequate oral intake related to lethargy/confusion as evidenced by meal completion < 25%.  GOAL:   Patient will meet greater than or equal to 90% of their needs  MONITOR:   PO intake, Supplement acceptance, Labs, Weight trends, Skin, I & O's  REASON FOR ASSESSMENT:   Malnutrition Screening Tool    ASSESSMENT:   EDIA PURSIFULL is a 74 y.o. female with history of Alcohol dependence GERD HTN presents to the ED with altered mental status. Patient is not able to provide a history. She was found wandering around [place] [facility] pas[facility] trying to get [facility] the wrong car. She was obviously confused. She was last discharged on 03/15/14 with similar presentation of encephalopathy. She was thought at that time to have a UTI and was treated. On discharge she had cleared and had a Ammonia of 17 today she has a ammonia of 36 and a lactate of 3.18  Pt admitted with AMS, acute encephalopathy.   Pt out of room for procedure at time of visit. Unable to complete Nutrition-Focused physical exam at this time. Pt with sitter at bedside.   Meal completion records reveal poor meal intake (PO: 5-25%). Pt with Ensure already ordered; RD will continue to order to optimize nutritional intake.   Reviewed wt hx, which revealed mild, progressive wt loss over the past year, however, changes not significant for time frame.   Labs and medications reviewed.Noted folic acid, vitamin B-1, and MVI supplementation.   Diet Order:  Diet Carb Modified Fluid consistency:: Thin; Room service appropriate?: Yes  Skin:  Reviewed, no issues  Last BM:  12/01/14  Height:   Ht Readings from Last 1 Encounters:  11/30/14 5\' 7"  (1.702 m)    Weight:   Wt Readings from Last 1 Encounters:  11/30/14 196  lb 3.4 oz (89 kg)    Ideal Body Weight:  61.4 kg  BMI:  Body mass index is 30.72 kg/(m^2).  Estimated Nutritional Needs:   Kcal:  1700-1900  Protein:  95-105 grams  Fluid:  1.7-1.9 L  EDUCATION NEEDS:   Education needs no appropriate at this time  Jenifer A. Jimmye Norman, RD, LDN, CDE Pager: [phone] After hours Pager: [phone]

## 2014-12-01 NOTE — Progress Notes (Signed)
CRITICAL VALUE ALERT  Critical value received:  Lactic acid 3.5  Date of notification:  12/01/14  Time of notification:  0035  Critical value read back:Yes.    Nurse who received alert:  Amaryllis Dyke  MD notified (1st page):  Rogue Bussing, NP  Time of first page:  0036  MD notified (2nd page):  Time of second page:  Responding MD:  Rogue Bussing, NP  Time MD responded:  [phone]

## 2014-12-01 NOTE — Care Management Note (Addendum)
Case Management Note  Patient Details  Name: Denise Kelly MRN: [id number] Date of Birth: [birth year]  Subjective/Objective:                Patient from home, admitted  With hepatic enceph. Seizure, and AMS with ammonia of 36 being treated with Lactulose and elevated lactic acid, and elevated ETOH. Patient ordered sitter at this time. Is out of room for test.  Date: 9-16 Friday Spoke with patient at the bedside. Introduced self as Tourist information centre manager and explained role in discharge planning and how to be reached. Verified patient lives in apartment in [place] alone, has DME walker 2 wheeled. Expressed potential need for no other DME. Verified patient anticipates home alone, at time of discharge and will have part-time supervision by daughter, Baldo Ash [phone] who is a paramedic, at this time to best of her knowledge. Patient denied needing help with her medication. Patient drives to MD appointments. Verified patient has PCP Dr Ileene Rubens [phone], [facility].   Plan: CM will continue to follow for discharge planning and [facility] resources.   Carles Collet RN BSN CM [phone]   Action/Plan:  Will continue to follow and offer resources as needed.  Expected Discharge Date:                  Expected Discharge Plan:  Home/Self Care  In-House Referral:     Discharge planning Services  CM Consult  Post Acute Care Choice:    Choice offered to:     DME Arranged:    DME Agency:     HH Arranged:    HH Agency:     Status of Service:  In process, will continue to follow  Medicare Important Message Given:    Date Medicare IM Given:    Medicare IM give by:    Date Additional Medicare IM Given:    Additional Medicare Important Message give by:     If discussed at [place] of Stay Meetings, dates discussed:    Additional Comments:  Carles Collet, RN 12/01/2014, 11:43 AM

## 2014-12-01 NOTE — Progress Notes (Signed)
CRITICAL VALUE ALERT  Critical value received:  Lactic Acid 3.8  Date of notification:  12/01/14  Time of notification:  0317  Critical value read back:Yes.    Nurse who received alert:  Ruben Reason, RN  MD notified (1st page):  [facility]  Time of first page:  0317  MD notified (2nd page):  Time of second page:  Responding MD:  pending  Time MD responded:  pending

## 2014-12-01 NOTE — Evaluation (Signed)
Physical Therapy Evaluation Patient Details Name: Denise Kelly MRN: [id number] DOB: 09-16-1940 Today's Date: 12/01/2014   History of Present Illness  Pt is a 74 y.o. female with history of Alcohol dependence GERD HTN presents to the ED with altered mental status. Patient is not able to provide a history. She was found wandering around in the pasrking lot at [facility] trying to get into the wrong car. She was obviously confused. She was last discharged on 03/15/14 with similar presentation of encephalopathy. She was thought at that time to have a UTI and was treated.  Clinical Impression  Pt admitted with above diagnosis. Pt currently with functional limitations due to the deficits listed below (see PT Problem List). At the time of PT eval pt was able to perform functional mobility with supervision for safety. Occasional min guard assist was provided during gait training but no physical assist was required. Pt still with AMS and feel she is safe to return home with 24 hour supervision initially until mentation is back to baseline. Pt will benefit from skilled PT to increase their independence and safety with mobility to allow discharge to the venue listed below.       Follow Up Recommendations No PT follow up;Supervision/Assistance - 24 hour (until mentation clears)    Equipment Recommendations  None recommended by PT    Recommendations for Other Services       Precautions / Restrictions Precautions Precautions: Fall Restrictions Weight Bearing Restrictions: No      Mobility  Bed Mobility Overal bed mobility: Modified Independent                Transfers Overall transfer level: Needs assistance Equipment used: None Transfers: Sit to/from Stand Sit to Stand: Supervision         General transfer comment: Supervision for safety.   Ambulation/Gait Ambulation/Gait assistance: Supervision Ambulation Distance (Feet): 250 Feet Assistive device: None Gait  Pattern/deviations: Step-through pattern;Decreased stride length Gait velocity: Decreased Gait velocity interpretation: Below normal speed for age/gender General Gait Details: Pt ambulating very slow and guarded. Was overall at a supervision level but min guard was provided initially as she began gait training. Appears slightly unsteady at times however no physical assist required.   Stairs            Wheelchair Mobility    Modified Rankin (Stroke Patients Only)       Balance Overall balance assessment: Needs assistance Sitting-balance support: Feet supported;No upper extremity supported Sitting balance-Leahy Scale: Good     Standing balance support: No upper extremity supported Standing balance-Leahy Scale: Fair                               Pertinent Vitals/Pain Pain Assessment: No/denies pain    Home Living Family/patient expects to be discharged to:: Private residence Living Arrangements: Alone Available Help at Discharge: Family;Available PRN/intermittently Type of Home: House Home Access: Level entry     Home Layout: One level Home Equipment: Walker - 2 wheels Additional Comments: pt lives alone - dtr lives close by    Prior Function Level of Independence: Independent         Comments: Pt reports that she was not requiring an AD and was independent with everything at home, however she has let the cooking and cleaning go at home. Unsure if pt does her own grocery shopping or if the patient's daughter was bringing in groceries.  Hand Dominance   Dominant Hand: Right    Extremity/Trunk Assessment   Upper Extremity Assessment: Defer to OT evaluation           Lower Extremity Assessment: Overall WFL for tasks assessed      Cervical / Trunk Assessment: Normal  Communication   Communication: HOH  Cognition Arousal/Alertness: Awake/alert Behavior During Therapy: Flat affect Overall Cognitive Status: Impaired/Different from  baseline Area of Impairment: Memory;Safety/judgement;Problem solving     Memory: Decreased short-term memory   Safety/Judgement: Decreased awareness of safety;Decreased awareness of deficits   Problem Solving: Slow processing;Decreased initiation      General Comments      Exercises        Assessment/Plan    PT Assessment Patient needs continued PT services  PT Diagnosis Difficulty walking   PT Problem List Decreased strength;Decreased range of motion;Decreased activity tolerance;Decreased balance;Decreased mobility;Decreased knowledge of use of DME;Decreased safety awareness;Decreased knowledge of precautions  PT Treatment Interventions DME instruction;Gait training;Stair training;Functional mobility training;Therapeutic activities;Therapeutic exercise;Neuromuscular re-education;Patient/family education   PT Goals (Current goals can be found in the Care Plan section) Acute Rehab PT Goals Patient Stated Goal: Return home today PT Goal Formulation: With patient Time For Goal Achievement: 12/08/14 Potential to Achieve Goals: Good    Frequency Min 3X/week   Barriers to discharge Decreased caregiver support Lives alone    Co-evaluation               End of Session Equipment Utilized During Treatment: Gait belt Activity Tolerance: Patient tolerated treatment well Patient left: in chair;with call bell/phone within reach;with nursing/sitter in room Nurse Communication: Mobility status         Time: 1411-1437 PT Time Calculation (min) (ACUTE ONLY): 26 min   Charges:   PT Evaluation $Initial PT Evaluation Tier I: 1 Procedure PT Treatments $Gait Training: 8-22 mins   PT G Codes:        Rolinda Roan 12/16/14, 3:02 PM   Rolinda Roan, PT, DPT Acute Rehabilitation Services Pager: [phone]

## 2014-12-01 NOTE — Procedures (Signed)
ELECTROENCEPHALOGRAM REPORT  Date of Study: 12/01/2014  Patient's Name: Denise Kelly MRN: [id number] Date of Birth: 1940-10-31  Referring Provider: Dr. Roland Rack  Clinical History: This is a 74 year old woman with several episodes of confusion. She was seen wandering in a parking lot and trying to get into the wrong car.   Medications: gabapentin (NEURONTIN) capsule 300 mg citalopram (CELEXA) tablet 20 mg folic acid (FOLVITE) tablet 1 mg furosemide (LASIX) tablet 20 mg lactulose (CHRONULAC) 10 GM/15ML solution 20 g loratadine (CLARITIN) tablet 10 mg piperacillin-tazobactam (ZOSYN) IVPB 3.375 g thiamine (VITAMIN B-1) tablet 100 mg traZODone (DESYREL) tablet 50 mg vancomycin (VANCOCIN) IVPB 750 mg/150 ml premix  Technical Summary: A multichannel digital EEG recording measured by the international 10-20 system with electrodes applied with paste and impedances below 5000 ohms performed as portable with EKG monitoring in an awake and confused patient.  Hyperventilation and photic stimulation were not performed.  The digital EEG was referentially recorded, reformatted, and digitally filtered in a variety of bipolar and referential montages for optimal display.   Description: The patient is awake during the recording. She is noted to be confused. There is no clear posterior dominant rhythm. The background consists of a large amount of diffuse 4-7 Hz theta and 2-3 Hz delta slowing, at times sharply contoured over the left temporal region without clear epileptogenic potential. Normal sleep architecture is not seen. Hyperventilation and photic stimulation were not performed.  There were no clear epileptiform discharges or electrographic seizures seen.    EKG lead showed sinus tachycardia.  Impression: This awake EEG is abnormal due to moderate diffuse slowing of the waking background.  Clinical Correlation of the above findings indicates diffuse cerebral dysfunction that is  non-specific in etiology and can be seen with hypoxic/ischemic injury, toxic/metabolic encephalopathies, or medication effect.  The absence of epileptiform discharges does not rule out a clinical diagnosis of epilepsy.  Clinical correlation is advised.   Ellouise Newer, M.D.

## 2014-12-02 ENCOUNTER — Inpatient Hospital Stay (HOSPITAL_COMMUNITY): Payer: Medicare Other

## 2014-12-02 ENCOUNTER — Encounter (HOSPITAL_COMMUNITY): Payer: Self-pay | Admitting: *Deleted

## 2014-12-02 LAB — COMPREHENSIVE METABOLIC PANEL
ALK PHOS: 73 U/L (ref 38–126)
ALT: 21 U/L (ref 14–54)
ANION GAP: 8 (ref 5–15)
AST: 34 U/L (ref 15–41)
Albumin: 3.8 g/dL (ref 3.5–5.0)
BILIRUBIN TOTAL: 1.5 mg/dL — AB (ref 0.3–1.2)
BUN: 8 mg/dL (ref 6–20)
CALCIUM: 9.1 mg/dL (ref 8.9–10.3)
CO2: 26 mmol/L (ref 22–32)
CREATININE: 0.86 mg/dL (ref 0.44–1.00)
Chloride: 109 mmol/L (ref 101–111)
Glucose, Bld: 118 mg/dL — ABNORMAL HIGH (ref 65–99)
Potassium: 3.3 mmol/L — ABNORMAL LOW (ref 3.5–5.1)
Sodium: 143 mmol/L (ref 135–145)
TOTAL PROTEIN: 6.9 g/dL (ref 6.5–8.1)

## 2014-12-02 LAB — AMMONIA: AMMONIA: 18 umol/L (ref 9–35)

## 2014-12-02 LAB — HEMOGLOBIN A1C
Hgb A1c MFr Bld: 5.5 % (ref 4.8–5.6)
Mean Plasma Glucose: 111 mg/dL

## 2014-12-02 LAB — LACTIC ACID, PLASMA: LACTIC ACID, VENOUS: 0.9 mmol/L (ref 0.5–2.0)

## 2014-12-02 MED ORDER — POTASSIUM CHLORIDE CRYS ER 20 MEQ PO TBCR
40.0000 meq | EXTENDED_RELEASE_TABLET | Freq: Once | ORAL | Status: AC
  Administered 2014-12-02: 40 meq via ORAL
  Filled 2014-12-02: qty 2

## 2014-12-02 NOTE — Discharge Summary (Addendum)
Physician Discharge Summary  MATEYA TORTI HYI:[id number] DOB: 23-Dec-1940 DOA: 11/30/2014  PCP: Jonathon Bellows, MD  Admit date: 11/30/2014 Discharge date: 12/02/2014  Time spent: 20 minutes  Recommendations for Outpatient Follow-up:  1. Follow up with PCP in 1-2 weeks  Discharge Diagnoses:  Active Problems:   Essential hypertension, benign   Altered mental status   Alcohol dependence   Hypokalemia   Altered mental state  Discharge Condition: Improved  Diet recommendation: Regualr  Filed Weights   11/30/14 1801 11/30/14 2255  Weight: 90.719 kg (200 lb) 89 kg (196 lb 3.4 oz)    History of present illness:  Please see admit h and p from 9/15 for details. Briefly, pt presented with altered mental status.  Hospital Course:  1. Acute mental status possibly secondary to hepatic encephalopathy 1. Neurology was consulted. MRI neg, CT neg, EEG unremarkable 2. Pt's mental status seems much improved 3. No signs of acute infection 4. On further discussion with family, pt noted to have bouts of ETOH binges, last admitted binge on the day of admission. Per pt's daughter, pt even noted to have open bottle of wine in car 5. Ammonia was mildly elevated on admit, normalized with lactulose 6. Encephalopathy likely secondary to acute liver injury resulting from etoh binge. RUQ [place] neg for obstructing stones and florid liver disease. Incidental benign appearing adenomatosis again seen on [place] and is stable compared to 2014 2. ETOH dependence 1. ETOH neg 3. HTN 1. BP stable 2. Cont monitor 4. Hypokalemia 1. normalized 5. DVT prophylaxis 1. SCD's while admitted  Discharge Exam: Filed Vitals:   12/02/14 0015 12/02/14 0049 12/02/14 0631 12/02/14 1131  BP: 152/78 159/89 148/88 143/83  Pulse: 98 91 88 105  Temp: 98.4 F (36.9 C)  98.2 F (36.8 C) 98 F (36.7 C)  TempSrc: Oral  Oral Oral  Resp: 18  18 20   Height:      Weight:      SpO2: 94%  95% 95%    General: Awake, in  nad Cardiovascular: regular, s1, s2 Respiratory: normal resp effort, no wheezing  Discharge Instructions     Medication List    STOP taking these medications        amoxicillin 500 MG capsule  Commonly known as:  AMOXIL     methocarbamol 500 MG tablet  Commonly known as:  ROBAXIN     oxyCODONE 5 MG immediate release tablet  Commonly known as:  Oxy IR/ROXICODONE      TAKE these medications        acetaminophen 500 MG tablet  Commonly known as:  TYLENOL  Take 1,000 mg by mouth daily as needed for mild pain.     aspirin 81 MG tablet  Take 81 mg by mouth daily.     ASTAXANTHIN PO  Take 1 tablet by mouth.     cholecalciferol 1000 UNITS tablet  Commonly known as:  VITAMIN D  Take 1,000 Units by mouth daily.     citalopram 20 MG tablet  Commonly known as:  CELEXA  Take 1 tablet (20 mg total) by mouth at bedtime.     CLARITIN 10 MG tablet  Generic drug:  loratadine  Take 10 mg by mouth 4 (four) times daily as needed for itching.     feeding supplement (ENSURE COMPLETE) Liqd  Take 237 mLs by mouth 2 (two) times daily between meals.     furosemide 20 MG tablet  Commonly known as:  LASIX  Take 20 mg  by mouth daily as needed for edema.     gabapentin 100 MG capsule  Commonly known as:  NEURONTIN  Take 300 mg by mouth at bedtime.     hydrOXYzine 25 MG tablet  Commonly known as:  ATARAX/VISTARIL  Take 50 mg by mouth at bedtime.     LUTEIN PO  Take 1 tablet by mouth 2 (two) times daily.     omega-3 acid ethyl esters 1 G capsule  Commonly known as:  LOVAZA  Take 1 g by mouth daily.     ondansetron 4 MG disintegrating tablet  Commonly known as:  ZOFRAN-ODT  Take 4 mg by mouth every 6 (six) hours as needed for nausea.     polyethylene glycol packet  Commonly known as:  MIRALAX / GLYCOLAX  Take 17 g by mouth daily as needed for moderate constipation.     potassium chloride SA 20 MEQ tablet  Commonly known as:  K-DUR,KLOR-CON  Take 1 tablet (20 mEq total) by  mouth 2 (two) times daily.     PROBIOTIC ADVANCED PO  Take 1 tablet by mouth daily.     pyridoxine 100 MG tablet  Commonly known as:  B-6  Take 100 mg by mouth 2 (two) times daily.     senna-docusate 8.6-50 MG per tablet  Commonly known as:  Senokot-S  Take 2 tablets by mouth at bedtime.     spironolactone 25 MG tablet  Commonly known as:  ALDACTONE  Take 25 mg by mouth daily.     SYSTANE OP  Apply 1 drop to eye 3 (three) times daily as needed (dry eyes).     traZODone 50 MG tablet  Commonly known as:  DESYREL  Take 1 tablet (50 mg total) by mouth at bedtime.     vitamin C 500 MG tablet  Commonly known as:  ASCORBIC ACID  Take 500 mg by mouth 2 (two) times daily.     zolpidem 5 MG tablet  Commonly known as:  AMBIEN  Take 5 mg by mouth at bedtime. Take every night per patient       Allergies  Allergen Reactions  . Sulfa Antibiotics Hives and Swelling  . Ciprofloxacin Itching  . Motrin [Ibuprofen] Palpitations   Follow-up Information    Follow up with WEBB, CAROL D, MD. Schedule an appointment as soon as possible for a visit in 1 week.   Specialty:  Family Medicine   Why:  Hospital follow up   Contact information:   [address] [place] [place] [address] [phone]        The results of significant diagnostics from this hospitalization (including imaging, microbiology, ancillary and laboratory) are listed below for reference.    Significant Diagnostic Studies: Ct Head Wo Contrast  11/30/2014   CLINICAL DATA:  Altered mental status/confusion  EXAM: CT HEAD WITHOUT CONTRAST  TECHNIQUE: Contiguous axial images were obtained from the base of the skull through the vertex without intravenous contrast.  COMPARISON:  March 13, 2014  FINDINGS: There is mild diffuse atrophy. 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 The appearance in this area is stable. There is no intracranial mass, acute hemorrhage,  extra-axial fluid collection, or midline shift. There is patchy small vessel disease in the centra semiovale bilaterally, stable in appearance. No acute infarct evident. The bony calvarium appears intact. Visualized mastoid air cells are clear. There is rightward deviation of the nasal septum.  IMPRESSION: Artifact from prior left-sided  cavernous sinus-carotid fistula therapy. Atrophy with patchy periventricular small vessel disease 3, stable. No demonstrable mass, hemorrhage, or acute appearing infarct.   Electronically Signed   By: Lowella Grip III M.D.   On: 11/30/2014 20:32   Mr Brain Wo Contrast  12/01/2014   CLINICAL DATA:  Altered mental status, found wandering in parking lot. History of encephalopathy, associated with urinary tract infection. History of cavernous sinus carotid fistula repair and alcohol dependence, hypertension.  EXAM: MRI HEAD WITHOUT CONTRAST  TECHNIQUE: Multiplanar, multiecho pulse sequences of the brain and surrounding structures were obtained without intravenous contrast. Per technologist note, patient was confused, and had difficulty remaining still for the examination.  COMPARISON:  CT head November 30, 2014  FINDINGS: Susceptibility artifact about the cavernous sinuses corresponding to the known embolic material. In addition, multiple sequences are moderate to severely motion degraded.  The ventricles and sulci are normal for patient's age. No abnormal parenchymal signal, mass lesions, mass effect. Patchy white matter FLAIR T2 hyperintensities are less than expected for patient's age, likely representing chronic small vessel ischemic disease. No reduced diffusion to suggest acute ischemia. Old small RIGHT basal ganglia lacunar infarct versus perivascular space. No susceptibility artifact to suggest hemorrhage though markedly limited due to motion and, embolic material.  No abnormal extra-axial fluid collections. No extra-axial masses though, contrast enhanced sequences would  be more sensitive. Normal major intracranial vascular flow voids seen at the skull base.  Status post bilateral ocular lens implants. No abnormal sellar expansion. Visualized paranasal sinuses and mastoid air cells are well-aerated. No suspicious calvarial bone marrow signal. No abnormal sellar expansion. Craniocervical junction maintained.  IMPRESSION: No acute intracranial process on this motion degraded examination.  Status post LEFT cavernous carotid fistula repair.   Electronically Signed   By: Elon Alas M.D.   On: 12/01/2014 23:01   Dg Chest Port 1 View  12/01/2014   CLINICAL DATA:  Sepsis.  EXAM: PORTABLE CHEST - 1 VIEW  COMPARISON:  11/30/2014 at 19:19 p.m.  FINDINGS: Lungs are hypoinflated without focal consolidation or effusion. Previous noted mild bibasilar density not well seen. Borderline cardiomegaly unchanged. Remainder of the exam is unchanged.  IMPRESSION: Hypoinflation without acute cardiopulmonary disease.   Electronically Signed   By: Marin Olp M.D.   On: 12/01/2014 00:16   Dg Chest Port 1 View  11/30/2014   CLINICAL DATA:  Alcohol abuse. Altered mental status. Right upper quadrant pain.  EXAM: PORTABLE CHEST - 1 VIEW  COMPARISON:  Chest radiograph 03/13/2014  FINDINGS: Normal cardiac and mediastinal contours. Heterogeneous opacities within the bilateral lower lobes. No pleural effusion or pneumothorax. Low lung volumes.  IMPRESSION: Low lung volumes with heterogeneous opacities in the bilateral lower lobes favored to represent atelectasis.   Electronically Signed   By: Lovey Newcomer M.D.   On: 11/30/2014 19:39   US Abdomen Limited Ruq  12/02/2014   CLINICAL DATA:  Elevated bilirubin. Evaluate for biliary obstruction.  EXAM: US ABDOMEN LIMITED - RIGHT UPPER QUADRANT  COMPARISON:  CT, 03/13/2014.  FINDINGS: Gallbladder:  Mostly decompressed. No stones. No evidence of acute cholecystitis. There is a masslike area of heterogeneous echogenicity from the gallbladder fundus. This was  present on the prior CT. It may reflect focal adenomyomatosis. This has been stable since a liver MRI dated 06/29/2012 which supports a benign etiology.  Common bile duct:  Diameter: 6.4 mm  Liver:  Normal parenchymal echogenicity. Liver cysts, which were present on the prior imaging. No other liver lesion. Normal hepatopetal flow  in the portal vein.  IMPRESSION: Masslike area of heterogeneous echogenicity from the gallbladder fundus. This is similar to prior studies. The stability of this area argues against neoplastic disease. There are no gallstones. There is no acute cholecystitis.  No bile duct dilation.   Electronically Signed   By: Lajean Manes M.D.   On: 12/02/2014 16:38    Microbiology: Recent Results (from the past 240 hour(s))  Culture, blood (x 2)     Status: None (Preliminary result)   Collection Time: 11/30/14 11:30 PM  Result Value Ref Range Status   Specimen Description BLOOD RIGHT ANTECUBITAL  Final   Special Requests BOTTLES DRAWN AEROBIC ONLY 5CC  Final   Culture NO GROWTH 1 DAY  Final   Report Status PENDING  Incomplete  Culture, blood (x 2)     Status: None (Preliminary result)   Collection Time: 11/30/14 11:40 PM  Result Value Ref Range Status   Specimen Description BLOOD RIGHT HAND  Final   Special Requests IN PEDIATRIC BOTTLE 2CC  Final   Culture NO GROWTH 1 DAY  Final   Report Status PENDING  Incomplete     Labs: Basic Metabolic Panel:  Recent Labs Lab 11/30/14 1943 11/30/14 2330 12/02/14 0849  NA 141 139 143  K 3.4* 3.6 3.3*  CL 104 105 109  CO2 23 22 26   GLUCOSE 123* 121* 118*  BUN 7 7 8   CREATININE 0.90 0.98 0.86  CALCIUM 9.6 9.5 9.1   Liver Function Tests:  Recent Labs Lab 11/30/14 1943 11/30/14 2330 12/02/14 0849  AST 31 38 34  ALT 16 17 21   ALKPHOS 80 78 73  BILITOT 1.1 1.5* 1.5*  PROT 7.6 7.2 6.9  ALBUMIN 4.4 4.2 3.8   No results for input(s): LIPASE, AMYLASE in the last 168 hours.  Recent Labs Lab 11/30/14 1943 12/02/14 0849   AMMONIA 36* 18   CBC:  Recent Labs Lab 11/30/14 1943 11/30/14 2330  WBC 8.7 11.5*  NEUTROABS 6.3 7.3  HGB 16.4* 16.5*  HCT 46.4* 46.1*  MCV 89.7 90.2  PLT 168 163   Cardiac Enzymes: No results for input(s): CKTOTAL, CKMB, CKMBINDEX, TROPONINI in the last 168 hours. BNP: BNP (last 3 results) No results for input(s): BNP in the last 8760 hours.  ProBNP (last 3 results)  Recent Labs  02/21/14 1835  PROBNP 166.9*    CBG:  Recent Labs Lab 11/30/14 2035  GLUCAP 105*    Signed:  CHIU, STEPHEN K  [facility] 12/02/2014, 5:07 PM

## 2014-12-02 NOTE — Progress Notes (Signed)
Subjective: Patient complaining of headache. Mental status has improved.  Objective: Current vital signs: BP 148/88 mmHg  Pulse 88  Temp(Src) 98.2 F (36.8 C) (Oral)  Resp 18  Ht 5\' 7"  (1.702 m)  Wt 89 kg (196 lb 3.4 oz)  BMI 30.72 kg/m2  SpO2 95%  Neurologic Exam: Patient was alert and in no acute distress. She was well-oriented to time as well as place. Mental status was normal. Extraocular movements were full and conjugate. No facial weakness was noted. Speech was normal with no dysarthria. Strength of extremities was normal throughout and symmetrical. Muscle tone was normal throughout. Coordination was normal.  EEG on 12/01/2014 was normal, with no indications of acute encephalopathy nor seizure disorder.  MRI of the brain showed no acute intracranial abnormality.  Medications: I have reviewed the patient's current medications.  Assessment/Plan: 74 year old lady who presented with altered mental status which has resolved at this point. Etiology is unclear. MRI study showed no signs of an acute intracranial abnormality. EEG showed no indications of an acute encephalopathy nor seizure disorder.  Further neurological intervention is recommended at this point. I will sign off on her care, but remain available follow-up neurological evaluation is indicated.  C.R. Nicole Kindred, MD [facility] [phone]  12/02/2014  9:24 AM

## 2014-12-02 NOTE — Progress Notes (Signed)
Patient discharged to home with daughter, Discharge education and orders reviewed and all questions addressed. IV dc and telemetry dc'd. Vitals stable for pt. 12/02/2014 6:13 PM Bowie,Sharlene

## 2014-12-06 LAB — CULTURE, BLOOD (ROUTINE X 2)
CULTURE: NO GROWTH
CULTURE: NO GROWTH

## 2015-10-20 ENCOUNTER — Other Ambulatory Visit: Payer: Self-pay | Admitting: Family Medicine

## 2015-10-20 DIAGNOSIS — R1011 Right upper quadrant pain: Secondary | ICD-10-CM

## 2015-10-22 ENCOUNTER — Other Ambulatory Visit: Payer: Self-pay | Admitting: Obstetrics and Gynecology

## 2015-10-22 ENCOUNTER — Telehealth: Payer: Self-pay

## 2015-10-22 DIAGNOSIS — R0602 Shortness of breath: Secondary | ICD-10-CM

## 2015-10-22 NOTE — Telephone Encounter (Signed)
Order for PFT has been placed and order has been linked.  Nothing further is needed.

## 2015-10-30 ENCOUNTER — Ambulatory Visit
Admission: RE | Admit: 2015-10-30 | Discharge: 2015-10-30 | Disposition: A | Discharge status: Home / Self Care | Payer: Medicare Other | Source: Ambulatory Visit | Attending: Family Medicine | Admitting: Family Medicine

## 2015-10-30 DIAGNOSIS — R1011 Right upper quadrant pain: Secondary | ICD-10-CM

## 2015-11-16 ENCOUNTER — Institutional Professional Consult (permissible substitution): Payer: Self-pay | Admitting: Internal Medicine

## 2015-11-28 ENCOUNTER — Inpatient Hospital Stay (HOSPITAL_COMMUNITY)
Admission: EM | Admit: 2015-11-28 | Discharge: 2015-12-01 | DRG: 481 | Disposition: A | Discharge status: Skilled Nursing Facility | Payer: Medicare Other | Attending: Internal Medicine | Admitting: Internal Medicine

## 2015-11-28 ENCOUNTER — Encounter (HOSPITAL_COMMUNITY): Payer: Self-pay | Admitting: *Deleted

## 2015-11-28 ENCOUNTER — Inpatient Hospital Stay (HOSPITAL_COMMUNITY): Payer: Medicare Other

## 2015-11-28 ENCOUNTER — Emergency Department (HOSPITAL_COMMUNITY): Payer: Medicare Other

## 2015-11-28 ENCOUNTER — Emergency Department (HOSPITAL_COMMUNITY)
Admission: EM | Admit: 2015-11-28 | Discharge: 2015-11-28 | Discharge status: Absent without leave | Payer: Medicare Other

## 2015-11-28 DIAGNOSIS — W19XXXA Unspecified fall, initial encounter: Secondary | ICD-10-CM | POA: Diagnosis not present

## 2015-11-28 DIAGNOSIS — S72002A Fracture of unspecified part of neck of left femur, initial encounter for closed fracture: Secondary | ICD-10-CM | POA: Diagnosis not present

## 2015-11-28 DIAGNOSIS — Z96653 Presence of artificial knee joint, bilateral: Secondary | ICD-10-CM | POA: Diagnosis present

## 2015-11-28 DIAGNOSIS — Y92009 Unspecified place in unspecified non-institutional (private) residence as the place of occurrence of the external cause: Secondary | ICD-10-CM

## 2015-11-28 DIAGNOSIS — M1990 Unspecified osteoarthritis, unspecified site: Secondary | ICD-10-CM | POA: Diagnosis present

## 2015-11-28 DIAGNOSIS — Z87891 Personal history of nicotine dependence: Secondary | ICD-10-CM

## 2015-11-28 DIAGNOSIS — S72002D Fracture of unspecified part of neck of left femur, subsequent encounter for closed fracture with routine healing: Secondary | ICD-10-CM | POA: Diagnosis not present

## 2015-11-28 DIAGNOSIS — R0602 Shortness of breath: Secondary | ICD-10-CM

## 2015-11-28 DIAGNOSIS — Z87440 Personal history of urinary (tract) infections: Secondary | ICD-10-CM | POA: Diagnosis not present

## 2015-11-28 DIAGNOSIS — Z8781 Personal history of (healed) traumatic fracture: Secondary | ICD-10-CM | POA: Diagnosis present

## 2015-11-28 DIAGNOSIS — M25552 Pain in left hip: Secondary | ICD-10-CM | POA: Diagnosis present

## 2015-11-28 DIAGNOSIS — I10 Essential (primary) hypertension: Secondary | ICD-10-CM | POA: Diagnosis present

## 2015-11-28 DIAGNOSIS — G2581 Restless legs syndrome: Secondary | ICD-10-CM | POA: Diagnosis present

## 2015-11-28 DIAGNOSIS — Z7982 Long term (current) use of aspirin: Secondary | ICD-10-CM | POA: Diagnosis not present

## 2015-11-28 DIAGNOSIS — S72142A Displaced intertrochanteric fracture of left femur, initial encounter for closed fracture: Principal | ICD-10-CM | POA: Diagnosis present

## 2015-11-28 DIAGNOSIS — Z882 Allergy status to sulfonamides status: Secondary | ICD-10-CM | POA: Diagnosis not present

## 2015-11-28 DIAGNOSIS — R296 Repeated falls: Secondary | ICD-10-CM | POA: Diagnosis present

## 2015-11-28 DIAGNOSIS — Z9842 Cataract extraction status, left eye: Secondary | ICD-10-CM | POA: Diagnosis not present

## 2015-11-28 DIAGNOSIS — R300 Dysuria: Secondary | ICD-10-CM | POA: Diagnosis present

## 2015-11-28 DIAGNOSIS — Z9841 Cataract extraction status, right eye: Secondary | ICD-10-CM | POA: Diagnosis not present

## 2015-11-28 DIAGNOSIS — Z79899 Other long term (current) drug therapy: Secondary | ICD-10-CM

## 2015-11-28 DIAGNOSIS — Z881 Allergy status to other antibiotic agents status: Secondary | ICD-10-CM

## 2015-11-28 DIAGNOSIS — Z419 Encounter for procedure for purposes other than remedying health state, unspecified: Secondary | ICD-10-CM

## 2015-11-28 DIAGNOSIS — Z886 Allergy status to analgesic agent status: Secondary | ICD-10-CM | POA: Diagnosis not present

## 2015-11-28 DIAGNOSIS — K219 Gastro-esophageal reflux disease without esophagitis: Secondary | ICD-10-CM | POA: Diagnosis present

## 2015-11-28 DIAGNOSIS — N390 Urinary tract infection, site not specified: Secondary | ICD-10-CM | POA: Diagnosis present

## 2015-11-28 DIAGNOSIS — D62 Acute posthemorrhagic anemia: Secondary | ICD-10-CM | POA: Diagnosis not present

## 2015-11-28 DIAGNOSIS — R739 Hyperglycemia, unspecified: Secondary | ICD-10-CM | POA: Diagnosis present

## 2015-11-28 DIAGNOSIS — E876 Hypokalemia: Secondary | ICD-10-CM | POA: Diagnosis present

## 2015-11-28 DIAGNOSIS — R110 Nausea: Secondary | ICD-10-CM | POA: Diagnosis present

## 2015-11-28 DIAGNOSIS — W1809XA Striking against other object with subsequent fall, initial encounter: Secondary | ICD-10-CM | POA: Diagnosis present

## 2015-11-28 DIAGNOSIS — S72009A Fracture of unspecified part of neck of unspecified femur, initial encounter for closed fracture: Secondary | ICD-10-CM | POA: Diagnosis present

## 2015-11-28 DIAGNOSIS — R000 Tachycardia, unspecified: Secondary | ICD-10-CM | POA: Diagnosis present

## 2015-11-28 LAB — BASIC METABOLIC PANEL
ANION GAP: 12 (ref 5–15)
Anion gap: 7 (ref 5–15)
BUN: 11 mg/dL (ref 6–20)
BUN: 11 mg/dL (ref 6–20)
CHLORIDE: 99 mmol/L — AB (ref 101–111)
CHLORIDE: 104 mmol/L (ref 101–111)
CO2: 26 mmol/L (ref 22–32)
CO2: 27 mmol/L (ref 22–32)
Calcium: 8.4 mg/dL — ABNORMAL LOW (ref 8.9–10.3)
Calcium: 8.2 mg/dL — ABNORMAL LOW (ref 8.9–10.3)
Creatinine, Ser: 0.84 mg/dL (ref 0.44–1.00)
Creatinine, Ser: 0.86 mg/dL (ref 0.44–1.00)
GFR calc Af Amer: >60 mL/min (ref >60)
GFR calc Af Amer: >60 mL/min (ref >60)
GFR calc non Af Amer: >60 mL/min (ref >60)
GFR calc non Af Amer: >60 mL/min (ref >60)
GLUCOSE: 179 mg/dL — AB (ref 65–99)
Glucose, Bld: 150 mg/dL — ABNORMAL HIGH (ref 65–99)
POTASSIUM: 2.6 mmol/L — AB (ref 3.5–5.1)
Potassium: 3.1 mmol/L — ABNORMAL LOW (ref 3.5–5.1)
SODIUM: 137 mmol/L (ref 135–145)
SODIUM: 138 mmol/L (ref 135–145)

## 2015-11-28 LAB — CBC WITH DIFFERENTIAL/PLATELET
BASOS ABS: 0 10*3/uL (ref 0.0–0.1)
Basophils Relative: 0 %
EOS PCT: 0 %
Eosinophils Absolute: 0 10*3/uL (ref 0.0–0.7)
HEMATOCRIT: 42.6 % (ref 36.0–46.0)
Hemoglobin: 14.8 g/dL (ref 12.0–15.0)
LYMPHS ABS: 0.9 10*3/uL (ref 0.7–4.0)
LYMPHS PCT: 7 %
MCH: 31.6 pg (ref 26.0–34.0)
MCHC: 34.7 g/dL (ref 30.0–36.0)
MCV: 91 fL (ref 78.0–100.0)
Monocytes Absolute: 1.4 10*3/uL — ABNORMAL HIGH (ref 0.1–1.0)
Monocytes Relative: 11 %
NEUTROS ABS: 10.8 10*3/uL — AB (ref 1.7–7.7)
Neutrophils Relative %: 82 %
PLATELETS: 159 10*3/uL (ref 150–400)
RBC: 4.68 MIL/uL (ref 3.87–5.11)
RDW: 12.3 % (ref 11.5–15.5)
WBC: 13.1 10*3/uL — AB (ref 4.0–10.5)

## 2015-11-28 LAB — URINE MICROSCOPIC-ADD ON

## 2015-11-28 LAB — TYPE AND SCREEN
ABO/RH(D): O NEG
ANTIBODY SCREEN: NEGATIVE

## 2015-11-28 LAB — ABO/RH: ABO/RH(D): O NEG

## 2015-11-28 LAB — URINALYSIS, ROUTINE W REFLEX MICROSCOPIC
Glucose, UA: NEGATIVE mg/dL
Ketones, ur: 15 mg/dL — AB
Nitrite: POSITIVE — AB
PH: 5.5 (ref 5.0–8.0)
Protein, ur: 30 mg/dL — AB
SPECIFIC GRAVITY, URINE: 1.019 (ref 1.005–1.030)

## 2015-11-28 LAB — ETHANOL

## 2015-11-28 LAB — PROTIME-INR
INR: 1.16
Prothrombin Time: 14.9 seconds (ref 11.4–15.2)

## 2015-11-28 LAB — SURGICAL PCR SCREEN
MRSA, PCR: NEGATIVE
Staphylococcus aureus: NEGATIVE

## 2015-11-28 MED ORDER — ACETAMINOPHEN 500 MG PO TABS
1000.0000 mg | ORAL_TABLET | Freq: Once | ORAL | Status: AC
  Administered 2015-11-28: 1000 mg via ORAL
  Filled 2015-11-28: qty 2

## 2015-11-28 MED ORDER — LORATADINE 10 MG PO TABS
10.0000 mg | ORAL_TABLET | Freq: Every day | ORAL | Status: DC | PRN
  Filled 2015-11-28: qty 1

## 2015-11-28 MED ORDER — MAGNESIUM SULFATE 2 GM/50ML IV SOLN
2.0000 g | Freq: Once | INTRAVENOUS | Status: AC
  Administered 2015-11-28: 2 g via INTRAVENOUS
  Filled 2015-11-28: qty 50

## 2015-11-28 MED ORDER — VITAMIN D 1000 UNITS PO TABS
1000.0000 [IU] | ORAL_TABLET | Freq: Every day | ORAL | Status: DC
  Administered 2015-11-28 – 2015-12-01 (×4): 1000 [IU] via ORAL
  Filled 2015-11-28 (×3): qty 1

## 2015-11-28 MED ORDER — DEXTROSE 5 % IV SOLN
1.0000 g | INTRAVENOUS | Status: DC
  Administered 2015-11-29 – 2015-12-01 (×3): 1 g via INTRAVENOUS
  Filled 2015-11-28 (×4): qty 10

## 2015-11-28 MED ORDER — POTASSIUM CHLORIDE CRYS ER 20 MEQ PO TBCR
40.0000 meq | EXTENDED_RELEASE_TABLET | Freq: Once | ORAL | Status: AC
  Administered 2015-11-28: 40 meq via ORAL
  Filled 2015-11-28: qty 2

## 2015-11-28 MED ORDER — ACETAMINOPHEN 500 MG PO TABS
1000.0000 mg | ORAL_TABLET | Freq: Every day | ORAL | Status: DC | PRN
Start: 2015-11-28 — End: 2015-12-01
  Administered 2015-11-28: 1000 mg via ORAL

## 2015-11-28 MED ORDER — SODIUM CHLORIDE 0.9 % IV SOLN
INTRAVENOUS | Status: DC
  Administered 2015-11-28 – 2015-11-29 (×3): via INTRAVENOUS

## 2015-11-28 MED ORDER — ONDANSETRON HCL 4 MG/2ML IJ SOLN
4.0000 mg | Freq: Once | INTRAMUSCULAR | Status: AC
Start: 2015-11-28 — End: 2015-11-28
  Administered 2015-11-28: 4 mg via INTRAVENOUS
  Filled 2015-11-28: qty 2

## 2015-11-28 MED ORDER — SPIRONOLACTONE 25 MG PO TABS
25.0000 mg | ORAL_TABLET | Freq: Every day | ORAL | Status: DC
  Administered 2015-11-28 – 2015-12-01 (×3): 25 mg via ORAL
  Filled 2015-11-28 (×3): qty 1

## 2015-11-28 MED ORDER — HYDROCODONE-ACETAMINOPHEN 5-325 MG PO TABS
1.0000 | ORAL_TABLET | Freq: Four times a day (QID) | ORAL | Status: DC | PRN
  Administered 2015-11-28: 1 via ORAL
  Administered 2015-11-28 – 2015-12-01 (×9): 2 via ORAL
  Filled 2015-11-28 (×3): qty 2
  Filled 2015-11-28: qty 1
  Filled 2015-11-28 (×6): qty 2

## 2015-11-28 MED ORDER — FENTANYL CITRATE (PF) 100 MCG/2ML IJ SOLN
50.0000 ug | Freq: Once | INTRAMUSCULAR | Status: AC
  Administered 2015-11-28: 50 ug via INTRAVENOUS
  Filled 2015-11-28: qty 2

## 2015-11-28 MED ORDER — CHLORHEXIDINE GLUCONATE CLOTH 2 % EX PADS
6.0000 | MEDICATED_PAD | Freq: Every day | CUTANEOUS | Status: DC
  Administered 2015-11-28 – 2015-11-29 (×2): 6 via TOPICAL

## 2015-11-28 MED ORDER — ASPIRIN 81 MG PO CHEW
81.0000 mg | CHEWABLE_TABLET | Freq: Every day | ORAL | Status: DC
  Administered 2015-11-28 – 2015-12-01 (×3): 81 mg via ORAL
  Filled 2015-11-28 (×3): qty 1

## 2015-11-28 MED ORDER — ENOXAPARIN SODIUM 40 MG/0.4ML ~~LOC~~ SOLN
40.0000 mg | SUBCUTANEOUS | Status: DC
  Administered 2015-11-28 – 2015-11-30 (×3): 40 mg via SUBCUTANEOUS
  Filled 2015-11-28 (×3): qty 0.4

## 2015-11-28 MED ORDER — POVIDONE-IODINE 10 % EX SWAB
2.0000 "application " | Freq: Once | CUTANEOUS | Status: AC
  Administered 2015-11-29: 2 via TOPICAL

## 2015-11-28 MED ORDER — POTASSIUM CHLORIDE 10 MEQ/100ML IV SOLN
10.0000 meq | INTRAVENOUS | Status: AC
  Administered 2015-11-28 (×3): 10 meq via INTRAVENOUS
  Filled 2015-11-28 (×3): qty 100

## 2015-11-28 MED ORDER — MORPHINE SULFATE (PF) 4 MG/ML IV SOLN
4.0000 mg | Freq: Once | INTRAVENOUS | Status: AC
  Administered 2015-11-28: 4 mg via INTRAVENOUS
  Filled 2015-11-28: qty 1

## 2015-11-28 MED ORDER — POTASSIUM CHLORIDE CRYS ER 20 MEQ PO TBCR
40.0000 meq | EXTENDED_RELEASE_TABLET | ORAL | Status: AC
  Administered 2015-11-28: 40 meq via ORAL
  Filled 2015-11-28: qty 2

## 2015-11-28 MED ORDER — LACTATED RINGERS IV SOLN: INTRAVENOUS | Status: DC

## 2015-11-28 MED ORDER — PRAMIPEXOLE DIHYDROCHLORIDE 0.125 MG PO TABS
0.3750 mg | ORAL_TABLET | Freq: Two times a day (BID) | ORAL | Status: DC | PRN
  Administered 2015-12-01: 0.375 mg via ORAL
  Filled 2015-11-28: qty 3
  Filled 2015-11-28: qty 1

## 2015-11-28 MED ORDER — CHLORHEXIDINE GLUCONATE 4 % EX LIQD
60.0000 mL | Freq: Once | CUTANEOUS | Status: AC
  Administered 2015-11-28: 4 via TOPICAL
  Filled 2015-11-28: qty 60

## 2015-11-28 MED ORDER — DEXTROSE 5 % IV SOLN
1.0000 g | Freq: Once | INTRAVENOUS | Status: DC
  Filled 2015-11-28: qty 10

## 2015-11-28 MED ORDER — HYDROXYZINE HCL 25 MG PO TABS
50.0000 mg | ORAL_TABLET | Freq: Every evening | ORAL | Status: DC | PRN
  Administered 2015-11-30 – 2015-12-01 (×2): 50 mg via ORAL
  Filled 2015-11-28 (×2): qty 2

## 2015-11-28 MED ORDER — DEXTROSE 5 % IV SOLN
1.0000 g | Freq: Once | INTRAVENOUS | Status: AC
  Administered 2015-11-28: 1 g via INTRAVENOUS
  Filled 2015-11-28: qty 10

## 2015-11-28 MED ORDER — MORPHINE SULFATE (PF) 2 MG/ML IV SOLN
0.5000 mg | INTRAVENOUS | Status: DC | PRN
  Administered 2015-11-28 – 2015-11-30 (×6): 0.5 mg via INTRAVENOUS
  Filled 2015-11-28 (×6): qty 1

## 2015-11-28 MED ORDER — TRAZODONE HCL 100 MG PO TABS
100.0000 mg | ORAL_TABLET | Freq: Every day | ORAL | Status: DC
  Administered 2015-11-28 – 2015-11-30 (×3): 100 mg via ORAL
  Filled 2015-11-28 (×3): qty 1

## 2015-11-28 NOTE — ED Triage Notes (Signed)
EMS gave pt 239mcg fentanyl IV with improvement to pain and 4mg  zofran IV.

## 2015-11-28 NOTE — ED Provider Notes (Signed)
[facility] DEPT Provider Note   CSN: [id number] Arrival date & time: 11/28/15  0109   By signing my name below, I, Denise Kelly, attest that this documentation has been prepared under the direction and in the presence of Denise Hacker, MD . Electronically Signed: Estanislado Kelly, Scribe. 11/28/2015. 1:33 AM.   History   Chief Complaint Chief Complaint  Patient presents with  . Fall  . Hip Pain    The history is provided by the patient. No language interpreter was used.   HPI Comments:  Denise Kelly is a 75 y.o. female who presents to the Emergency Department via EMS s/p a fall that occurred last night. Pt states that she stood up last night to go to the bathroom and slipped and landed on her L leg. Pt reports that she did not have her phone and did not try to get up immediately. Pt rates 0/10 because she Was given fentanyl. Pt denies SOB, CP, LOC, head trauma. Denies other injury.  Past Medical History:  Diagnosis Date  . Alcohol abuse    ETOH and xanax  . Anxiety   . Arthritis   . Cancer ([place])   . Complication of anesthesia    "felt drunk for a week after"  . Encephalopathy   . GERD (gastroesophageal reflux disease)   . Hypertension    not being treated with medication  . Palpitations     Patient Active Problem List   Diagnosis Date Noted  . Hip fracture ([place]) 11/28/2015  . Altered mental state 11/30/2014  . Hypokalemia 03/14/2014  . Acute encephalopathy 03/14/2014  . UTI (lower urinary tract infection) 03/14/2014  . Diarrhea 03/14/2014  . Effusion of right knee 02/22/2014  . Alcohol dependence ([place]) 02/22/2014  . Substance induced mood disorder ([place]) 02/22/2014  . Hepatic encephalopathy ([place])   . Altered mental status 02/21/2014  . SIRS (systemic inflammatory response syndrome) ([place]) 02/21/2014  . Complicated UTI (urinary tract infection)   . E coli bacteremia   . Sepsis due to Escherichia coli ([place])   . Screen for STD (sexually transmitted  disease)   . S/P total knee arthroplasty 01/02/2014  . Right knee pain 11/10/2013  . Essential hypertension, benign 11/10/2013  . LAFB (left anterior fascicular block) 11/10/2013  . Obesity, unspecified 11/10/2013    Past Surgical History:  Procedure Laterality Date  . BREAST SURGERY     2 breast biopsies  . carotid cavernous fistula     to block  . COLONOSCOPY    . EYE SURGERY Bilateral    cataracts  . JOINT REPLACEMENT Left 09/2009   knee  . JOINT REPLACEMENT Right 01/02/2014  . TONSILLECTOMY    . TOTAL KNEE ARTHROPLASTY Right 01/02/2014   Dr Ronnie Derby  . TOTAL KNEE ARTHROPLASTY Right 01/02/2014   Procedure: TOTAL KNEE ARTHROPLASTY;  Surgeon: [name], MD;  Location: [facility];  Service: Orthopedics;  Laterality: Right;  . TUBAL LIGATION      OB History    No data available       Home Medications    Prior to Admission medications   Medication Sig Start Date End Date Taking? Authorizing Provider  acetaminophen (TYLENOL) 500 MG tablet Take 1,000 mg by mouth daily as needed for mild pain.   Yes Historical Provider, MD  aspirin 81 MG tablet Take 81 mg by mouth daily.   Yes Historical Provider, MD  CALCIUM PO Take 1 tablet by mouth daily.   Yes Historical Provider, MD  cholecalciferol (VITAMIN  D) 1000 UNITS tablet Take 1,000 Units by mouth daily.   Yes Historical Provider, MD  hydrOXYzine (ATARAX/VISTARIL) 25 MG tablet Take 50 mg by mouth at bedtime as needed for anxiety (sleep).  08/12/13  Yes Historical Provider, MD  loratadine (CLARITIN) 10 MG tablet Take 10 mg by mouth daily as needed for allergies or itching.    Yes Historical Provider, MD  LUTEIN PO Take 1 tablet by mouth daily.    Yes Historical Provider, MD  omega-3 acid ethyl esters (LOVAZA) 1 G capsule Take 1 g by mouth daily.   Yes Historical Provider, MD  ondansetron (ZOFRAN-ODT) 4 MG disintegrating tablet Take 4 mg by mouth every 6 (six) hours as needed for nausea.  10/31/13  Yes Historical Provider, MD  pramipexole  (MIRAPEX) 0.125 MG tablet Take 0.375 mg by mouth 2 (two) times daily as needed (restless leg).  10/11/15  Yes Historical Provider, MD  Probiotic Product (PROBIOTIC ADVANCED PO) Take 1 tablet by mouth daily.   Yes Historical Provider, MD  spironolactone (ALDACTONE) 25 MG tablet Take 25 mg by mouth daily.   Yes Historical Provider, MD  traZODone (DESYREL) 50 MG tablet Take 1 tablet (50 mg total) by mouth at bedtime. Patient taking differently: Take 100 mg by mouth at bedtime.  02/24/14  Yes [name], MD  citalopram (CELEXA) 20 MG tablet Take 1 tablet (20 mg total) by mouth at bedtime. Patient not taking: Reported on 11/28/2015 02/24/14   [name], MD  feeding supplement, ENSURE COMPLETE, (ENSURE COMPLETE) LIQD Take 237 mLs by mouth 2 (two) times daily between meals. Patient not taking: Reported on 11/28/2015 03/15/14   [name], MD    Family History No family history on file.  Social History Social History  Substance Use Topics  . Smoking status: Former Research scientist (life sciences)  . Smokeless tobacco: Never Used     Comment: quit smoking many years ago   . Alcohol use No     Allergies   Sulfa antibiotics; Ciprofloxacin; and Motrin [ibuprofen]   Review of Systems Review of Systems  Respiratory: Negative for shortness of breath.   Cardiovascular: Negative for chest pain.  Musculoskeletal: Positive for myalgias.  Neurological: Negative for syncope.  All other systems reviewed and are negative.    Physical Exam Updated Vital Signs BP 142/87 (BP Location: Left Arm)   Pulse 115   Temp 99.2 F (37.3 C) (Oral)   Resp 18   SpO2 96%   Physical Exam  Constitutional: She is oriented to person, place, and time.  Elderly  HENT:  Head: Normocephalic and atraumatic.  Mucous membranes dry  Cardiovascular: Regular rhythm and normal heart sounds.   No murmur heard. Tachycardia  Pulmonary/Chest: Effort normal. No respiratory distress. She has no wheezes.  Abdominal: Soft. Bowel sounds are normal.  There is no tenderness. There is no guarding.  Musculoskeletal:  Tenderness to palpation lateral aspect of left hip, foreshortening and internal rotation noted, 2+ DP pulse, neurovascularly intact distally  Neurological: She is alert and oriented to person, place, and time.  Skin: Skin is warm and dry.  Psychiatric: She has a normal mood and affect.  Nursing note and vitals reviewed.    ED Treatments / Results  DIAGNOSTIC STUDIES:  Oxygen Saturation is 94% on RA, normal by my interpretation.    COORDINATION OF CARE:  1:37 AM Discussed treatment plan with pt at bedside and pt agreed to plan.  Labs (all labs ordered are listed, but only abnormal results are displayed) Labs Reviewed  BASIC METABOLIC PANEL - Abnormal; Notable for the following:       Result Value   Potassium 2.6 (*)    Chloride 99 (*)    Glucose, Bld 179 (*)    Calcium 8.4 (*)    All other components within normal limits  CBC WITH DIFFERENTIAL/PLATELET - Abnormal; Notable for the following:    WBC 13.1 (*)    Neutro Abs 10.8 (*)    Monocytes Absolute 1.4 (*)    All other components within normal limits  PROTIME-INR  ETHANOL  URINALYSIS, ROUTINE W REFLEX MICROSCOPIC (NOT AT [facility])  TYPE AND SCREEN  ABO/RH    EKG  EKG Interpretation  Date/Time:  Wednesday November 28 2015 02:50:20 EDT Ventricular Rate:  116 PR Interval:    QRS Duration: 112 QT Interval:  330 QTC Calculation: 459 R Axis:   -45 Text Interpretation:  Sinus tachycardia Ventricular premature complex Incomplete RBBB and LAFB Low voltage, precordial leads Confirmed by Kamaree Berkel  MD, Loma Sousa ([id number]) on 11/28/2015 2:54:18 AM       Radiology Dg Hip Unilat With Pelvis 2-3 Views Left  Result Date: 11/28/2015 CLINICAL DATA:  Fall while walking to the bathroom onto left hip, deformity and pain. EXAM: DG HIP (WITH OR WITHOUT PELVIS) 2-3V LEFT COMPARISON:  None. FINDINGS: Comminuted displaced left hip intertrochanteric fracture with mild angulation  and shortening of the femoral shaft. Lesser trochanteric component with at least 2 cm osseous distraction. No additional acute fracture of the bony pelvis. The bones are under mineralized. IMPRESSION: Comminuted displaced left intertrochanteric hip fracture. Electronically Signed   By: Jeb Levering M.D.   On: 11/28/2015 02:20    Procedures Procedures (including critical care time)  Medications Ordered in ED Medications  potassium chloride 10 mEq in 100 mL IVPB (10 mEq Intravenous New Bag/Given 11/28/15 0250)  potassium chloride SA (K-DUR,KLOR-CON) CR tablet 40 mEq (not administered)  fentaNYL (SUBLIMAZE) injection 50 mcg (50 mcg Intravenous Given 11/28/15 0247)     Initial Impression / Assessment and Plan / ED Course  I have reviewed the triage vital signs and the nursing notes.  Pertinent labs & imaging results that were available during my care of the patient were reviewed by me and considered in my medical decision making (see chart for details).  Clinical Course    Patient presents following a fall.  Likely has a left hip fracture. Denies hitting her head or loss of consciousness. Lab work obtained and notable for hypokalemia. Patient was given 3 runs of potassium. X-ray shows intertrochanteric hip fracture. Discussed with Dr. Percell Miller. Patient will likely have surgery tomorrow or the next day. Will admit to the hospitalist. EKG shows sinus tachycardia. EtOH level pending. Patient denies alcohol use but has a history. Discussed with Dr. Tamala Julian, hospitalist who will admit the patient to [facility].    Final Clinical Impressions(s) / ED Diagnoses   Final diagnoses:  Fall, initial encounter  Closed left hip fracture, initial encounter [facility])    New Prescriptions New Prescriptions   No medications on file   I personally performed the services described in this documentation, which was scribed in my presence. The recorded information has been reviewed and is accurate.       Denise Hacker, MD 11/28/15 [phone]

## 2015-11-28 NOTE — Consult Note (Signed)
ORTHOPAEDIC CONSULTATION  REQUESTING PHYSICIAN: Verlee Monte, MD  Chief Complaint: Left Hip pain  Assessment: Principal Problem:   Closed left hip fracture (HCC) Active Problems:   Hypokalemia   Hip fracture (HCC)   Dysuria   Fall   RLS (restless legs syndrome)   Plan: NPO after midnight. Medical optimization and surgical clearance. Plan for IM Nail Left Hip by Dr. Alain Marion A.M. Tomorrow - 11/29/15. Weight Bearing Status: NWB left leg VTE px: Lovenox, SCD's  Dispo: will likely need SNF postoperatively.  HPI: Denise Kelly is a 75 y.o. female with a history of HTN, hypokalemia, UTIs complains of  Left hip pain s/p mechanical fall from ground.  Multiple recent falls - notes tripping over cat.  No dizziness or LOC.  SOB recently has been in the work up process - was referred to pulmonology.  XR showed Comminuted displaced left intertrochanteric hip fracture. Orthopedics was consulted for evaluation.  Past Medical History:  Diagnosis Date  . Alcohol abuse    ETOH and xanax  . Anxiety   . Arthritis   . Cancer ([place])   . Complication of anesthesia    "felt drunk for a week after"  . Encephalopathy   . GERD (gastroesophageal reflux disease)   . Hypertension    not being treated with medication  . Palpitations    Past Surgical History:  Procedure Laterality Date  . BREAST SURGERY     2 breast biopsies  . carotid cavernous fistula     to block  . COLONOSCOPY    . EYE SURGERY Bilateral    cataracts  . JOINT REPLACEMENT Left 09/2009   knee  . JOINT REPLACEMENT Right 01/02/2014  . TONSILLECTOMY    . TOTAL KNEE ARTHROPLASTY Right 01/02/2014   Dr Ronnie Derby  . TOTAL KNEE ARTHROPLASTY Right 01/02/2014   Procedure: TOTAL KNEE ARTHROPLASTY;  Surgeon: [name], MD;  Location: [facility];  Service: Orthopedics;  Laterality: Right;  . TUBAL LIGATION     Social History   Social History  . Marital status: Single    Spouse name: N/A  . Number of children: N/A  . Years of  education: N/A   Social History Main Topics  . Smoking status: Former Research scientist (life sciences)  . Smokeless tobacco: Never Used     Comment: quit smoking many years ago   . Alcohol use No  . Drug use: No  . Sexual activity: No   Other Topics Concern  . None   Social History Narrative  . None   No family history on file. Allergies  Allergen Reactions  . Sulfa Antibiotics Hives and Swelling  . Ciprofloxacin Itching  . Motrin [Ibuprofen] Palpitations   Prior to Admission medications   Medication Sig Start Date End Date Taking? Authorizing Provider  acetaminophen (TYLENOL) 500 MG tablet Take 1,000 mg by mouth daily as needed for mild pain.   Yes Historical Provider, MD  aspirin 81 MG tablet Take 81 mg by mouth daily.   Yes Historical Provider, MD  CALCIUM PO Take 1 tablet by mouth daily.   Yes Historical Provider, MD  cholecalciferol (VITAMIN D) 1000 UNITS tablet Take 1,000 Units by mouth daily.   Yes Historical Provider, MD  hydrOXYzine (ATARAX/VISTARIL) 25 MG tablet Take 50 mg by mouth at bedtime as needed for anxiety (sleep).  08/12/13  Yes Historical Provider, MD  loratadine (CLARITIN) 10 MG tablet Take 10 mg by mouth daily as needed for allergies or itching.    Yes  Historical Provider, MD  LUTEIN PO Take 1 tablet by mouth daily.    Yes Historical Provider, MD  omega-3 acid ethyl esters (LOVAZA) 1 G capsule Take 1 g by mouth daily.   Yes Historical Provider, MD  ondansetron (ZOFRAN-ODT) 4 MG disintegrating tablet Take 4 mg by mouth every 6 (six) hours as needed for nausea.  10/31/13  Yes Historical Provider, MD  pramipexole (MIRAPEX) 0.125 MG tablet Take 0.375 mg by mouth 2 (two) times daily as needed (restless leg).  10/11/15  Yes Historical Provider, MD  Probiotic Product (PROBIOTIC ADVANCED PO) Take 1 tablet by mouth daily.   Yes Historical Provider, MD  spironolactone (ALDACTONE) 25 MG tablet Take 25 mg by mouth daily.   Yes Historical Provider, MD  traZODone (DESYREL) 50 MG tablet Take 1 tablet  (50 mg total) by mouth at bedtime. Patient taking differently: Take 100 mg by mouth at bedtime.  02/24/14  Yes [name], MD  citalopram (CELEXA) 20 MG tablet Take 1 tablet (20 mg total) by mouth at bedtime. Patient not taking: Reported on 11/28/2015 02/24/14   [name], MD  feeding supplement, ENSURE COMPLETE, (ENSURE COMPLETE) LIQD Take 237 mLs by mouth 2 (two) times daily between meals. Patient not taking: Reported on 11/28/2015 03/15/14   [name], MD   Dg Chest Port 1 View  Result Date: 11/28/2015 CLINICAL DATA:  Dyspnea.  Left hip fracture. EXAM: PORTABLE CHEST 1 VIEW COMPARISON:  11/30/2014 FINDINGS: There is unchanged mild right hemidiaphragm elevation. There is unchanged moderate cardiomegaly. The lungs are clear. There is no large effusion. There is no pneumothorax. The pulmonary vasculature is normal. IMPRESSION: Unchanged cardiomegaly.  No acute cardiopulmonary findings. Electronically Signed   By: Andreas Newport M.D.   On: 11/28/2015 04:04   Dg Hip Unilat With Pelvis 2-3 Views Left  Result Date: 11/28/2015 CLINICAL DATA:  Fall while walking to the bathroom onto left hip, deformity and pain. EXAM: DG HIP (WITH OR WITHOUT PELVIS) 2-3V LEFT COMPARISON:  None. FINDINGS: Comminuted displaced left hip intertrochanteric fracture with mild angulation and shortening of the femoral shaft. Lesser trochanteric component with at least 2 cm osseous distraction. No additional acute fracture of the bony pelvis. The bones are under mineralized. IMPRESSION: Comminuted displaced left intertrochanteric hip fracture. Electronically Signed   By: Jeb Levering M.D.   On: 11/28/2015 02:20    Positive ROS: All other systems have been reviewed and were otherwise negative with the exception of those mentioned in the HPI and as above.  Objective: Labs cbc  Recent Labs  11/28/15 0146  WBC 13.1*  HGB 14.8  HCT 42.6  PLT 159    Labs coag  Recent Labs  11/28/15 0146  INR 1.16     Recent  Labs  11/28/15 0146  NA 137  K 2.6*  CL 99*  CO2 26  GLUCOSE 179*  BUN 11  CREATININE 0.84  CALCIUM 8.4*    Physical Exam: Vitals:   11/28/15 0407 11/28/15 0530  BP: 136/90 126/84  Pulse: 114 (!) 115  Resp: 18 18  Temp:  99 F (37.2 C)   General: Alert, no acute distress Mental status: Alert and Oriented x3 Neurologic: Speech Clear and organized, no gross focal findings or movement disorder appreciated. Respiratory: No cyanosis, no use of accessory musculature Cardiovascular: No pedal edema GI: Abdomen is soft and non-tender, non-distended. Skin: Warm and dry.  No lesions in the area of chief complaint Extremities: Warm and dry Psychiatric: Patient is competent for consent  with normal mood and affect  MUSCULOSKELETAL:  Left leg externally rotated.  Left hip pain w/ rom.  NVI distally.  Distal sensation intact. Other extremities are atraumatic with painless ROM and NVI.  Prudencio Burly III PA-C 11/28/2015 8:11 AM

## 2015-11-28 NOTE — ED Notes (Signed)
Pt at X-ray

## 2015-11-28 NOTE — ED Triage Notes (Signed)
Pt bib EMS after a fall that occurred at an unknown time to pt.  Pt was unable to call for help for a few hours.  Pt denies LOC from fall.  EMS noted about an one inch shortening and rotation of left leg.  Pt a/o x 4.

## 2015-11-28 NOTE — H&P (Addendum)
History and Physical    Denise Kelly [id number] DOB: 12/10/1940 DOA: 11/28/2015  Referring MD/NP/PA: Dr. Dina Rich PCP: Jonathon Bellows, MD  Patient coming from: Home via EMS  Chief Complaint: I fell  HPI: Denise Kelly is a 75 y.o. female with medical history significant of HTN, hypokalemia, UTIs; who presents after having a fall at home. Patient notes that she had gotten up to use the bathroom last night and tripped on the rug and fell on her left leg. Patient is unsure if hit her head or lost consciousness. She reports having sharp pain in the left leg medially after the fall. She is not able to get to a phone and yell for help until her neighbors heard her. Pain was worsened by movement. Denies any recent changes in medications, previous fractures, lightheadedness, vomiting, palpitations, chest pain, abdominal pain, or diarrhea. Patient states that she is not had an alcoholic drink in over 10 years, tobacco, or illicit drug use. Patient notes that she has been short of breath for multiple weeks now and was recently referred to pulmonology, and is currently being set up to have PFT studies. Other associated symptoms include complaints of chronic nausea and dysuria.  ED Course: Upon admission to the emergency department patient was evaluated and seen to be afebrile with heart rates up to 115, and all other vitals within normal limits. Lab work revealed Wbc 13.1, potassium 2.6, glucose 179. X-rays revealed a left hip fracture. Patient was ordered 30 mEq of potassium chloride IV and given fentanyl for pain. Orthopedics was consulted and recommended transfer to [facility]. TRH to admit.  Review of Systems: As per HPI otherwise 10 point review of systems negative.   Past Medical History:  Diagnosis Date  . Alcohol abuse    ETOH and xanax  . Anxiety   . Arthritis   . Cancer ([facility])   . Complication of anesthesia    "felt drunk for a week after"  . Encephalopathy   . GERD (gastroesophageal  reflux disease)   . Hypertension    not being treated with medication  . Palpitations     Past Surgical History:  Procedure Laterality Date  . BREAST SURGERY     2 breast biopsies  . carotid cavernous fistula     to block  . COLONOSCOPY    . EYE SURGERY Bilateral    cataracts  . JOINT REPLACEMENT Left 09/2009   knee  . JOINT REPLACEMENT Right 01/02/2014  . TONSILLECTOMY    . TOTAL KNEE ARTHROPLASTY Right 01/02/2014   Dr Ronnie Derby  . TOTAL KNEE ARTHROPLASTY Right 01/02/2014   Procedure: TOTAL KNEE ARTHROPLASTY;  Surgeon: [name], MD;  Location: [facility];  Service: Orthopedics;  Laterality: Right;  . TUBAL LIGATION       reports that she has quit smoking. She has never used smokeless tobacco. She reports that she does not drink alcohol or use drugs.  Allergies  Allergen Reactions  . Sulfa Antibiotics Hives and Swelling  . Ciprofloxacin Itching  . Motrin [Ibuprofen] Palpitations    No family history on file.  Prior to Admission medications   Medication Sig Start Date End Date Taking? Authorizing Provider  acetaminophen (TYLENOL) 500 MG tablet Take 1,000 mg by mouth daily as needed for mild pain.   Yes Historical Provider, MD  aspirin 81 MG tablet Take 81 mg by mouth daily.   Yes Historical Provider, MD  CALCIUM PO Take 1 tablet by mouth daily.   Yes Historical  Provider, MD  cholecalciferol (VITAMIN D) 1000 UNITS tablet Take 1,000 Units by mouth daily.   Yes Historical Provider, MD  hydrOXYzine (ATARAX/VISTARIL) 25 MG tablet Take 50 mg by mouth at bedtime as needed for anxiety (sleep).  08/12/13  Yes Historical Provider, MD  loratadine (CLARITIN) 10 MG tablet Take 10 mg by mouth daily as needed for allergies or itching.    Yes Historical Provider, MD  LUTEIN PO Take 1 tablet by mouth daily.    Yes Historical Provider, MD  omega-3 acid ethyl esters (LOVAZA) 1 G capsule Take 1 g by mouth daily.   Yes Historical Provider, MD  ondansetron (ZOFRAN-ODT) 4 MG disintegrating tablet Take  4 mg by mouth every 6 (six) hours as needed for nausea.  10/31/13  Yes Historical Provider, MD  pramipexole (MIRAPEX) 0.125 MG tablet Take 0.375 mg by mouth 2 (two) times daily as needed (restless leg).  10/11/15  Yes Historical Provider, MD  Probiotic Product (PROBIOTIC ADVANCED PO) Take 1 tablet by mouth daily.   Yes Historical Provider, MD  spironolactone (ALDACTONE) 25 MG tablet Take 25 mg by mouth daily.   Yes Historical Provider, MD  traZODone (DESYREL) 50 MG tablet Take 1 tablet (50 mg total) by mouth at bedtime. Patient taking differently: Take 100 mg by mouth at bedtime.  02/24/14  Yes [name], MD  citalopram (CELEXA) 20 MG tablet Take 1 tablet (20 mg total) by mouth at bedtime. Patient not taking: Reported on 11/28/2015 02/24/14   [name], MD  feeding supplement, ENSURE COMPLETE, (ENSURE COMPLETE) LIQD Take 237 mLs by mouth 2 (two) times daily between meals. Patient not taking: Reported on 11/28/2015 03/15/14   [name], MD    Physical Exam:    Constitutional:[place] female NAD, calm, comfortable Vitals:   11/28/15 0137 11/28/15 0140  BP: 142/87   Pulse: 115   Resp: 18   Temp: 99.2 F (37.3 C)   TempSrc: Oral   SpO2: 92% 96%   Eyes: PERRL, lids and conjunctivae normal ENMT: Mucous membranes are dry. Posterior pharynx clear of any exudate or lesions.Normal dentition.  Neck: normal, supple, no masses, no thyromegaly Respiratory: clear to auscultation bilaterally, no wheezing, no crackles. Normal respiratory effort. No accessory muscle use.  Cardiovascular: tachycardia, no murmurs / rubs / gallops. No extremity edema. 2+ pedal pulses. No carotid bruits.  Abdomen: no tenderness, no masses palpated. No hepatosplenomegaly. Bowel sounds positive.  Musculoskeletal: no clubbing / cyanosis. TTP on the lateral aspect of the leg. Left leg externally rotated . Skin: no rashes, lesions, ulcers. No induration Neurologic: CN 2-12 grossly intact. Sensation intact, DTR normal. Strength 5/5  in all 4.  Psychiatric: Normal judgment and insight. Alert and oriented x 3. Normal mood.     Labs on Admission: I have personally reviewed following labs and imaging studies  CBC:  Recent Labs Lab 11/28/15 0146  WBC 13.1*  NEUTROABS 10.8*  HGB 14.8  HCT 42.6  MCV 91.0  PLT Q000111Q   Basic Metabolic Panel:  Recent Labs Lab 11/28/15 0146  NA 137  K 2.6*  CL 99*  CO2 26  GLUCOSE 179*  BUN 11  CREATININE 0.84  CALCIUM 8.4*   GFR: CrCl cannot be calculated (Unknown ideal weight.). Liver Function Tests: No results for input(s): AST, ALT, ALKPHOS, BILITOT, PROT, ALBUMIN in the last 168 hours. No results for input(s): LIPASE, AMYLASE in the last 168 hours. No results for input(s): AMMONIA in the last 168 hours. Coagulation Profile:  Recent Labs Lab 11/28/15  0146  INR 1.16   Cardiac Enzymes: No results for input(s): CKTOTAL, CKMB, CKMBINDEX, TROPONINI in the last 168 hours. BNP (last 3 results) No results for input(s): PROBNP in the last 8760 hours. HbA1C: No results for input(s): HGBA1C in the last 72 hours. CBG: No results for input(s): GLUCAP in the last 168 hours. Lipid Profile: No results for input(s): CHOL, HDL, LDLCALC, TRIG, CHOLHDL, LDLDIRECT in the last 72 hours. Thyroid Function Tests: No results for input(s): TSH, T4TOTAL, FREET4, T3FREE, THYROIDAB in the last 72 hours. Anemia Panel: No results for input(s): VITAMINB12, FOLATE, FERRITIN, TIBC, IRON, RETICCTPCT in the last 72 hours. Urine analysis:    Component Value Date/Time   COLORURINE YELLOW 11/30/2014 2105   APPEARANCEUR CLEAR 11/30/2014 2105   LABSPEC 1.008 11/30/2014 2105   PHURINE 8.5 (H) 11/30/2014 2105   GLUCOSEU NEGATIVE 11/30/2014 2105   HGBUR SMALL (A) 11/30/2014 2105   BILIRUBINUR NEGATIVE 11/30/2014 2105   KETONESUR 15 (A) 11/30/2014 2105   PROTEINUR NEGATIVE 11/30/2014 2105   UROBILINOGEN 1.0 11/30/2014 2105   NITRITE NEGATIVE 11/30/2014 2105   LEUKOCYTESUR NEGATIVE 11/30/2014  2105   Sepsis Labs: No results found for this or any previous visit (from the past 240 hour(s)).   Radiological Exams on Admission: Dg Hip Unilat With Pelvis 2-3 Views Left  Result Date: 11/28/2015 CLINICAL DATA:  Fall while walking to the bathroom onto left hip, deformity and pain. EXAM: DG HIP (WITH OR WITHOUT PELVIS) 2-3V LEFT COMPARISON:  None. FINDINGS: Comminuted displaced left hip intertrochanteric fracture with mild angulation and shortening of the femoral shaft. Lesser trochanteric component with at least 2 cm osseous distraction. No additional acute fracture of the bony pelvis. The bones are under mineralized. IMPRESSION: Comminuted displaced left intertrochanteric hip fracture. Electronically Signed   By: Jeb Levering M.D.   On: 11/28/2015 02:20    EKG: Independently reviewed. Sinus tachycardia  Assessment/Plan Left Hip fracture: Acute. Patient with a communicated displaced left intertrochanteric hip fracture - Admit to a telemetry bed at [facility] - Hip fracture protocol initiated - Pain control morphine/ - NPO - Dr. Percell Miller of orthopedics consulted in ED.  Possible surgery in am.  Sinus tachycardia: Acute.   - Follow-up telemetry  Hypokalemia: Acute on chronic. Initial potassium no debris 2.6 on admission. Ordered to be given 30 mEq of potassium chloride IV. - Potassium chloride 40 mEq oral 1 dose now - Continue to monitor and transfuse as needed  Leukocytosis: WBC elevated at 13.1 on admission. Patient reports some dysuria. - follow-up UA, if positive will check urine culture and treat with antibiotics of ceftriaxone  Dysuria: Acute. Patient reports frequent urinary tract infections in the past. - As seen above  Hyperglycemia: Previous hemoglobin A1c was noted to be 5.5 in 11/2014.  - Continue to monitor  Chronic nausea - Zofran prn    RLS - continue Mirapex  Alcohol abuse history: Patient denies any alcohol use.   DVT prophylaxis: Lovenox   Code  Status: Full Family Communication: no family present at bedside Disposition Plan:  will likely need discharge to rehabilitation facility  Consults called:  orthopedics  Admission status: Inpatient admission to telemetry bed   Norval Morton MD [facility] Pager [phone]  If 7PM-7AM, please contact night-coverage [email or web address] Password [email or web address]  11/28/2015, 3:13 AM

## 2015-11-28 NOTE — ED Notes (Signed)
Gave report to Manning, Therapist, sports on Rich at [employer]

## 2015-11-28 NOTE — ED Notes (Signed)
Pt reports the return of pain.  Dr. Dina Rich at bedside and aware of pt's pain.

## 2015-11-28 NOTE — ED Notes (Signed)
[facility] called for transport. 

## 2015-11-28 NOTE — Progress Notes (Signed)
PROGRESS NOTE  Denise Kelly  [id number] DOB: April 20, 1940 DOA: 11/28/2015 PCP: Jonathon Bellows, MD Outpatient Specialists:  Subjective: Denies any new complaints. Surgery will be tomorrow morning.  Brief Narrative:  Denise Kelly is a 75 y.o. female with medical history significant of HTN, hypokalemia, UTIs; who presents after having a fall at home. Patient notes that she had gotten up to use the bathroom last night and tripped on the rug and fell on her left leg. Patient is unsure if hit her head or lost consciousness. She reports having sharp pain in the left leg medially after the fall. She is not able to get to a phone and yell for help until her neighbors heard her. Pain was worsened by movement. Denies any recent changes in medications, previous fractures, lightheadedness, vomiting, palpitations, chest pain, abdominal pain, or diarrhea. Patient states that she is not had an alcoholic drink in over 10 years, tobacco, or illicit drug use. Patient notes that she has been short of breath for multiple weeks now and was recently referred to pulmonology, and is currently being set up to have PFT studies. Other associated symptoms include complaints of chronic nausea and dysuria.  Assessment & Plan:   Principal Problem:   Closed left hip fracture (HCC) Active Problems:   Hypokalemia   Hip fracture (HCC)   Dysuria   Fall   RLS (restless legs syndrome)   Left Hip fracture:  -Acute. Patient with a communicated displaced left intertrochanteric hip fracture - Admit to a telemetry bed at [facility] - Hip fracture protocol initiated - Orthopedics for surgical intervention tomorrow treated  Fall -Patient reported that she tripped on one of her cats, denies any prodromal symptoms prior to the fall. -Denies any loss of consciousness, denies any chest pain, shortness of breath or palpitations. -Patient is on telemetry, continue to monitor for 1 day but no further workup for  now.  UTI -Patient has dysuria and history of frequent UTIs. Urinalysis consistent with UTI. -Started on Rocephin, obtain urine culture and adjust antibiotics according to culture results.   Sinus tachycardia: Acute.   - Follow-up telemetry  Hypokalemia: -Potassium is 2.6, patient received 30 mEq of IV potassium and 40 mEq of oral potassium. -We'll add 40 mEq of oral potassium aspiration getting IV fluids. Check BMP and magnesium in a.m.  Leukocytosis: WBC elevated at 13.1 on admission. Patient reports some dysuria. - follow-up UA, if positive will check urine culture and treat with antibiotics of ceftriaxone  Hyperglycemia: Previous hemoglobin A1c was noted to be 5.5 in 11/2014.  - Continue to monitor  Chronic nausea - Zofran prn    RLS - continue Mirapex  Alcohol abuse history:  Patient denies any alcohol use.    DVT prophylaxis: Lovenox Code Status: Full Code Family Communication: I spoke with her daughter over the phone. Disposition Plan:  Diet: Diet regular Room service appropriate? Yes; Fluid consistency: Thin Diet NPO time specified  Consultants:   Orthopedics  Procedures:   None  Antimicrobials:   Rocephin  Objective: Vitals:   11/28/15 0140 11/28/15 0330 11/28/15 0407 11/28/15 0530  BP:  (!) 147/115 136/90 126/84  Pulse:   114 (!) 115  Resp:  12 18 18   Temp:    99 F (37.2 C)  TempSrc:    Oral  SpO2: 96%  91% 92%  Weight:    98.4 kg (216 lb 14.4 oz)  Height:    5\' 6"  (1.676 m)    Intake/Output Summary (Last 24  hours) at 11/28/15 1217 Last data filed at 11/28/15 1012  Gross per 24 hour  Intake              480 ml  Output                0 ml  Net              480 ml   Filed Weights   11/28/15 0530  Weight: 98.4 kg (216 lb 14.4 oz)    Examination: General exam: Appears calm and comfortable  Respiratory system: Clear to auscultation. Respiratory effort normal. Cardiovascular system: S1 & S2 heard, RRR. No JVD, murmurs, rubs,  gallops or clicks. No pedal edema. Gastrointestinal system: Abdomen is nondistended, soft and nontender. No organomegaly or masses felt. Normal bowel sounds heard. Central nervous system: Alert and oriented. No focal neurological deficits. Extremities: Symmetric 5 x 5 power. Skin: No rashes, lesions or ulcers Psychiatry: Judgement and insight appear normal. Mood & affect appropriate.   Data Reviewed: I have personally reviewed following labs and imaging studies  CBC:  Recent Labs Lab 11/28/15 0146  WBC 13.1*  NEUTROABS 10.8*  HGB 14.8  HCT 42.6  MCV 91.0  PLT Q000111Q   Basic Metabolic Panel:  Recent Labs Lab 11/28/15 0146  NA 137  K 2.6*  CL 99*  CO2 26  GLUCOSE 179*  BUN 11  CREATININE 0.84  CALCIUM 8.4*   GFR: Estimated Creatinine Clearance: 70.5 mL/min (by C-G formula based on SCr of 0.84 mg/dL). Liver Function Tests: No results for input(s): AST, ALT, ALKPHOS, BILITOT, PROT, ALBUMIN in the last 168 hours. No results for input(s): LIPASE, AMYLASE in the last 168 hours. No results for input(s): AMMONIA in the last 168 hours. Coagulation Profile:  Recent Labs Lab 11/28/15 0146  INR 1.16   Cardiac Enzymes: No results for input(s): CKTOTAL, CKMB, CKMBINDEX, TROPONINI in the last 168 hours. BNP (last 3 results) No results for input(s): PROBNP in the last 8760 hours. HbA1C: No results for input(s): HGBA1C in the last 72 hours. CBG: No results for input(s): GLUCAP in the last 168 hours. Lipid Profile: No results for input(s): CHOL, HDL, LDLCALC, TRIG, CHOLHDL, LDLDIRECT in the last 72 hours. Thyroid Function Tests: No results for input(s): TSH, T4TOTAL, FREET4, T3FREE, THYROIDAB in the last 72 hours. Anemia Panel: No results for input(s): VITAMINB12, FOLATE, FERRITIN, TIBC, IRON, RETICCTPCT in the last 72 hours. Urine analysis:    Component Value Date/Time   COLORURINE AMBER (A) 11/28/2015 0349   APPEARANCEUR CLOUDY (A) 11/28/2015 0349   LABSPEC 1.019  11/28/2015 0349   PHURINE 5.5 11/28/2015 0349   GLUCOSEU NEGATIVE 11/28/2015 0349   HGBUR LARGE (A) 11/28/2015 0349   BILIRUBINUR SMALL (A) 11/28/2015 0349   KETONESUR 15 (A) 11/28/2015 0349   PROTEINUR 30 (A) 11/28/2015 0349   UROBILINOGEN 1.0 11/30/2014 2105   NITRITE POSITIVE (A) 11/28/2015 0349   LEUKOCYTESUR MODERATE (A) 11/28/2015 0349   Sepsis Labs: @LABRCNTIP (procalcitonin:4,lacticidven:4)  ) Recent Results (from the past 240 hour(s))  Surgical pcr screen     Status: None   Collection Time: 11/28/15  5:30 AM  Result Value Ref Range Status   MRSA, PCR NEGATIVE NEGATIVE Final   Staphylococcus aureus NEGATIVE NEGATIVE Final    Comment:        The Xpert SA Assay (FDA approved for NASAL specimens in patients over 7 years of age), is one component of a comprehensive surveillance program.  Test performance has been validated by [facility]  for patients greater than or equal to 71 year old. It is not intended to diagnose infection nor to guide or monitor treatment.      Invalid input(s): PROCALCITONIN, LACTICACIDVEN   Radiology Studies: Dg Chest Port 1 View  Result Date: 11/28/2015 CLINICAL DATA:  Dyspnea.  Left hip fracture. EXAM: PORTABLE CHEST 1 VIEW COMPARISON:  11/30/2014 FINDINGS: There is unchanged mild right hemidiaphragm elevation. There is unchanged moderate cardiomegaly. The lungs are clear. There is no large effusion. There is no pneumothorax. The pulmonary vasculature is normal. IMPRESSION: Unchanged cardiomegaly.  No acute cardiopulmonary findings. Electronically Signed   By: Andreas Newport M.D.   On: 11/28/2015 04:04   Dg Hip Unilat With Pelvis 2-3 Views Left  Result Date: 11/28/2015 CLINICAL DATA:  Fall while walking to the bathroom onto left hip, deformity and pain. EXAM: DG HIP (WITH OR WITHOUT PELVIS) 2-3V LEFT COMPARISON:  None. FINDINGS: Comminuted displaced left hip intertrochanteric fracture with mild angulation and shortening of the femoral  shaft. Lesser trochanteric component with at least 2 cm osseous distraction. No additional acute fracture of the bony pelvis. The bones are under mineralized. IMPRESSION: Comminuted displaced left intertrochanteric hip fracture. Electronically Signed   By: Jeb Levering M.D.   On: 11/28/2015 02:20        Scheduled Meds: . aspirin  81 mg Oral Daily  . [START ON 11/29/2015] cefTRIAXone (ROCEPHIN)  IV  1 g Intravenous Q24H  . Chlorhexidine Gluconate Cloth  6 each Topical Q0600  . cholecalciferol  1,000 Units Oral Daily  . enoxaparin (LOVENOX) injection  40 mg Subcutaneous Q24H  . spironolactone  25 mg Oral Daily  . traZODone  100 mg Oral QHS   Continuous Infusions: . sodium chloride 75 mL/hr at 11/28/15 0546     LOS: 0 days    Time spent: 35 minutes    Aniza Shor A, MD [facility] Pager [phone]  If 7PM-7AM, please contact night-coverage [email or web address] Password [email or web address] 11/28/2015, 12:17 PM

## 2015-11-29 ENCOUNTER — Inpatient Hospital Stay (HOSPITAL_COMMUNITY): Payer: Medicare Other

## 2015-11-29 ENCOUNTER — Inpatient Hospital Stay (HOSPITAL_COMMUNITY): Payer: Medicare Other | Admitting: Anesthesiology

## 2015-11-29 ENCOUNTER — Encounter (HOSPITAL_COMMUNITY)
Admission: EM | Disposition: A | Discharge status: Skilled Nursing Facility | Payer: Self-pay | Source: Home / Self Care | Attending: Internal Medicine

## 2015-11-29 ENCOUNTER — Encounter (HOSPITAL_COMMUNITY): Payer: Self-pay | Admitting: Anesthesiology

## 2015-11-29 DIAGNOSIS — R300 Dysuria: Secondary | ICD-10-CM

## 2015-11-29 DIAGNOSIS — E876 Hypokalemia: Secondary | ICD-10-CM

## 2015-11-29 DIAGNOSIS — G2581 Restless legs syndrome: Secondary | ICD-10-CM

## 2015-11-29 DIAGNOSIS — W19XXXA Unspecified fall, initial encounter: Secondary | ICD-10-CM

## 2015-11-29 HISTORY — PX: INTRAMEDULLARY (IM) NAIL INTERTROCHANTERIC: SHX5875

## 2015-11-29 LAB — BASIC METABOLIC PANEL
ANION GAP: 6 (ref 5–15)
BUN: 6 mg/dL (ref 6–20)
CALCIUM: 8 mg/dL — AB (ref 8.9–10.3)
CO2: 31 mmol/L (ref 22–32)
Chloride: 104 mmol/L (ref 101–111)
Creatinine, Ser: 0.73 mg/dL (ref 0.44–1.00)
GLUCOSE: 117 mg/dL — AB (ref 65–99)
Potassium: 3.2 mmol/L — ABNORMAL LOW (ref 3.5–5.1)
SODIUM: 141 mmol/L (ref 135–145)

## 2015-11-29 LAB — TYPE AND SCREEN
ABO/RH(D): O NEG
ANTIBODY SCREEN: NEGATIVE

## 2015-11-29 LAB — MAGNESIUM: Magnesium: 1.9 mg/dL (ref 1.7–2.4)

## 2015-11-29 LAB — CALCIUM, IONIZED: CALCIUM, IONIZED, SERUM: 4.7 mg/dL (ref 4.5–5.6)

## 2015-11-29 LAB — AMMONIA: Ammonia: 50 umol/L — ABNORMAL HIGH (ref 9–35)

## 2015-11-29 SURGERY — FIXATION, FRACTURE, INTERTROCHANTERIC, WITH INTRAMEDULLARY ROD
Anesthesia: General | Laterality: Left

## 2015-11-29 MED ORDER — FENTANYL CITRATE (PF) 100 MCG/2ML IJ SOLN
INTRAMUSCULAR | Status: AC
  Filled 2015-11-29: qty 2

## 2015-11-29 MED ORDER — ROCURONIUM BROMIDE 100 MG/10ML IV SOLN
INTRAVENOUS | Status: DC | PRN
  Administered 2015-11-29: 50 mg via INTRAVENOUS

## 2015-11-29 MED ORDER — HYDROMORPHONE HCL 1 MG/ML IJ SOLN
0.2500 mg | INTRAMUSCULAR | Status: DC | PRN
  Administered 2015-11-29 (×4): 0.5 mg via INTRAVENOUS

## 2015-11-29 MED ORDER — ONDANSETRON HCL 4 MG/2ML IJ SOLN
INTRAMUSCULAR | Status: DC | PRN
  Administered 2015-11-29: 4 mg via INTRAVENOUS

## 2015-11-29 MED ORDER — POLYETHYLENE GLYCOL 3350 17 G PO PACK
17.0000 g | PACK | Freq: Every day | ORAL | Status: DC
  Administered 2015-11-29 – 2015-12-01 (×3): 17 g via ORAL
  Filled 2015-11-29 (×3): qty 1

## 2015-11-29 MED ORDER — PROPOFOL 10 MG/ML IV BOLUS
INTRAVENOUS | Status: DC | PRN
  Administered 2015-11-29: 110 mg via INTRAVENOUS
  Administered 2015-11-29: 30 mg via INTRAVENOUS

## 2015-11-29 MED ORDER — SUGAMMADEX SODIUM 200 MG/2ML IV SOLN
INTRAVENOUS | Status: DC | PRN
  Administered 2015-11-29: 200 mg via INTRAVENOUS

## 2015-11-29 MED ORDER — LIDOCAINE HCL (CARDIAC) 20 MG/ML IV SOLN
INTRAVENOUS | Status: DC | PRN
  Administered 2015-11-29: 60 mg via INTRAVENOUS

## 2015-11-29 MED ORDER — PROMETHAZINE HCL 25 MG/ML IJ SOLN: 6.2500 mg | INTRAMUSCULAR | Status: DC | PRN

## 2015-11-29 MED ORDER — PROPOFOL 10 MG/ML IV BOLUS
INTRAVENOUS | Status: AC
  Filled 2015-11-29: qty 40

## 2015-11-29 MED ORDER — LACTATED RINGERS IV SOLN
INTRAVENOUS | Status: DC
  Administered 2015-11-29 (×2): via INTRAVENOUS

## 2015-11-29 MED ORDER — CEFAZOLIN SODIUM-DEXTROSE 2-4 GM/100ML-% IV SOLN
INTRAVENOUS | Status: AC
  Filled 2015-11-29: qty 100

## 2015-11-29 MED ORDER — HYDROMORPHONE HCL 1 MG/ML IJ SOLN
INTRAMUSCULAR | Status: AC
  Administered 2015-11-29: 0.5 mg via INTRAVENOUS
  Filled 2015-11-29: qty 1

## 2015-11-29 MED ORDER — FENTANYL CITRATE (PF) 100 MCG/2ML IJ SOLN
INTRAMUSCULAR | Status: DC | PRN
  Administered 2015-11-29: 100 ug via INTRAVENOUS
  Administered 2015-11-29: 50 ug via INTRAVENOUS

## 2015-11-29 MED ORDER — ONDANSETRON HCL 4 MG/2ML IJ SOLN
4.0000 mg | Freq: Four times a day (QID) | INTRAMUSCULAR | Status: DC | PRN
  Administered 2015-11-30 (×3): 4 mg via INTRAVENOUS
  Filled 2015-11-29 (×3): qty 2

## 2015-11-29 MED ORDER — HYDROCODONE-ACETAMINOPHEN 7.5-325 MG PO TABS: 1.0000 | ORAL_TABLET | Freq: Once | ORAL | Status: DC | PRN

## 2015-11-29 MED ORDER — POTASSIUM CHLORIDE CRYS ER 20 MEQ PO TBCR
40.0000 meq | EXTENDED_RELEASE_TABLET | Freq: Four times a day (QID) | ORAL | Status: AC
  Administered 2015-11-29 (×2): 40 meq via ORAL
  Filled 2015-11-29 (×2): qty 2

## 2015-11-29 MED ORDER — CEFAZOLIN SODIUM-DEXTROSE 2-4 GM/100ML-% IV SOLN
2.0000 g | INTRAVENOUS | Status: AC
  Administered 2015-11-29: 2 g via INTRAVENOUS

## 2015-11-29 MED ORDER — ALBUMIN HUMAN 5 % IV SOLN
INTRAVENOUS | Status: DC | PRN
  Administered 2015-11-29: 10:00:00 via INTRAVENOUS

## 2015-11-29 SURGICAL SUPPLY — 36 items
BENZOIN TINCTURE PRP APPL 2/3 (GAUZE/BANDAGES/DRESSINGS) ×3 IMPLANT
BNDG COHESIVE 4X5 TAN STRL (GAUZE/BANDAGES/DRESSINGS) ×3 IMPLANT
BNDG GAUZE ELAST 4 BULKY (GAUZE/BANDAGES/DRESSINGS) ×3 IMPLANT
CLOSURE WOUND 1/2 X4 (GAUZE/BANDAGES/DRESSINGS) ×1
COVER PERINEAL POST (MISCELLANEOUS) ×3 IMPLANT
COVER SURGICAL LIGHT HANDLE (MISCELLANEOUS) ×3 IMPLANT
DRAPE STERI IOBAN 125X83 (DRAPES) ×3 IMPLANT
DRSG MEPILEX BORDER 4X4 (GAUZE/BANDAGES/DRESSINGS) ×6 IMPLANT
DURAPREP 26ML APPLICATOR (WOUND CARE) ×3 IMPLANT
ELECT REM PT RETURN 9FT ADLT (ELECTROSURGICAL) ×3
ELECTRODE REM PT RTRN 9FT ADLT (ELECTROSURGICAL) ×1 IMPLANT
GLOVE BIO SURGEON STRL SZ7.5 (GLOVE) ×6 IMPLANT
GLOVE BIOGEL PI IND STRL 8 (GLOVE) ×2 IMPLANT
GLOVE BIOGEL PI INDICATOR 8 (GLOVE) ×4
GOWN STRL REUS W/ TWL LRG LVL3 (GOWN DISPOSABLE) ×3 IMPLANT
GOWN STRL REUS W/TWL LRG LVL3 (GOWN DISPOSABLE) ×6
GUIDEROD T2 3X1000 (ROD) ×3 IMPLANT
K-WIRE  3.2X450M STR (WIRE) ×4
K-WIRE 3.2X450M STR (WIRE) ×2
KIT NAIL LONG 10X140X125 (Nail) ×3 IMPLANT
KIT ROOM TURNOVER OR (KITS) ×3 IMPLANT
KWIRE 3.2X450M STR (WIRE) ×2 IMPLANT
MANIFOLD NEPTUNE II (INSTRUMENTS) IMPLANT
NS IRRIG 1000ML POUR BTL (IV SOLUTION) ×3 IMPLANT
PACK GENERAL/GYN (CUSTOM PROCEDURE TRAY) ×3 IMPLANT
PAD ARMBOARD 7.5X6 YLW CONV (MISCELLANEOUS) ×3 IMPLANT
PAD CAST 4YDX4 CTTN HI CHSV (CAST SUPPLIES) ×1 IMPLANT
PADDING CAST COTTON 4X4 STRL (CAST SUPPLIES) ×2
SCREW LAG GAMMA 3 95MM (Screw) ×3 IMPLANT
STRIP CLOSURE SKIN 1/2X4 (GAUZE/BANDAGES/DRESSINGS) ×2 IMPLANT
SUT MNCRL AB 4-0 PS2 18 (SUTURE) IMPLANT
SUT MON AB 2-0 CT1 27 (SUTURE) ×3 IMPLANT
SUT VIC AB 0 CT1 27 (SUTURE) ×2
SUT VIC AB 0 CT1 27XBRD ANBCTR (SUTURE) ×1 IMPLANT
TOWEL OR 17X24 6PK STRL BLUE (TOWEL DISPOSABLE) ×3 IMPLANT
TOWEL OR 17X26 10 PK STRL BLUE (TOWEL DISPOSABLE) ×3 IMPLANT

## 2015-11-29 NOTE — Op Note (Signed)
DATE OF SURGERY:  11/29/2015  TIME: 10:23 AM  PATIENT NAME:  Denise Kelly  AGE: 75 y.o.  PRE-OPERATIVE DIAGNOSIS:  Left Intertrochantric hip fracture  POST-OPERATIVE DIAGNOSIS:  SAME  PROCEDURE:  LEFT HIP   NAIL  SURGEON:  Sweet Jarvis D  ASSISTANT:  Roxan Hockey, PA-C, he was present and scrubbed throughout the case, critical for completion in a timely fashion, and for retraction, instrumentation, and closure.   OPERATIVE IMPLANTS: [employer]ker Gamma Nail  PREOPERATIVE INDICATIONS:  [name] is a 75 y.o. year old who fell and suffered a hip fracture. She was brought into the ER and then admitted and optimized and then elected for surgical intervention.    The risks benefits and alternatives were discussed with the patient including but not limited to the risks of nonoperative treatment, versus surgical intervention including infection, bleeding, nerve injury, malunion, nonunion, hardware prominence, hardware failure, need for hardware removal, blood clots, cardiopulmonary complications, morbidity, mortality, among others, and they were willing to proceed.    OPERATIVE PROCEDURE:  The patient was brought to the operating room and placed in the supine position. General anesthesia was administered. She was placed on the fracture table.  Closed reduction was performed under C-arm guidance. Time out was then performed after sterile prep and drape. She received preoperative antibiotics.  Incision was made proximal to the greater trochanter. A guidewire was placed in the appropriate position. Confirmation was made on AP and lateral views. The above-named nail was opened. I opened the proximal femur with a reamer. I then placed the nail by hand easily down. I did not need to ream the femur.  Once the nail was completely seated, I placed a guidepin into the femoral head into the center center position. I measured the length, and then reamed the lateral cortex and up into the head. I  then placed the lag screw. Slight compression was applied. Anatomic fixation achieved. Bone quality was mediocre.  I then secured the proximal interlocking bolt, and took off a half a turn, and then removed the instruments, and took final C-arm pictures AP and lateral the entire length of the leg.   Anatomic reconstruction was achieved, and the wounds were irrigated copiously and closed with Vicryl followed by staples and sterile gauze for the skin. The patient was awakened and returned to PACU in stable and satisfactory condition. There no complications and the patient tolerated the procedure well.  She will be weightbearing as tolerated, and will be on chemical px  for a period of four weeks after discharge.   Edmonia Lynch, M.D.

## 2015-11-29 NOTE — Anesthesia Procedure Notes (Addendum)
Procedure Name: Intubation Date/Time: 11/29/2015 7:43 AM Performed by: Scheryl Darter Pre-anesthesia Checklist: Patient identified, Emergency Drugs available, Suction available and Patient being monitored Patient Re-evaluated:Patient Re-evaluated prior to inductionOxygen Delivery Method: Circle System Utilized Preoxygenation: Pre-oxygenation with 100% oxygen Intubation Type: IV induction Ventilation: Mask ventilation without difficulty Laryngoscope Size: Miller and 2 Grade View: Grade I Tube type: Oral Tube size: 7.5 mm Number of attempts: 1 Airway Equipment and Method: Stylet and Oral airway Placement Confirmation: ETT inserted through vocal cords under direct vision,  positive ETCO2 and breath sounds checked- equal and bilateral Secured at: 22 cm Tube secured with: Tape Dental Injury: Teeth and Oropharynx as per pre-operative assessment

## 2015-11-29 NOTE — Interval H&P Note (Signed)
History and Physical Interval Note:  11/29/2015 7:21 AM  Denise Kelly  has presented today for surgery, with the diagnosis of Left Intertrochantric hip fracture  The various methods of treatment have been discussed with the patient and family. After consideration of risks, benefits and other options for treatment, the patient has consented to  Procedure(s): LEFT HIP   NAIL (Left) as a surgical intervention .  The patient's history has been reviewed, patient examined, no change in status, stable for surgery.  I have reviewed the patient's chart and labs.  Questions were answered to the patient's satisfaction.     Byrd Rushlow D

## 2015-11-29 NOTE — Progress Notes (Signed)
PROGRESS NOTE  Denise Kelly  [id number] DOB: [birth year] DOA: 11/28/2015 PCP: Jonathon Bellows, MD Outpatient Specialists:  Subjective: Seen after her surgery, denies any new complaints ready to eat her lunch about 2:10 PM.  Brief Narrative:  Denise Kelly is a 75 y.o. female with medical history significant of HTN, hypokalemia, UTIs; who presents after having a fall at home. Patient notes that she had gotten up to use the bathroom last night and tripped on the rug and fell on her left leg. Patient is unsure if hit her head or lost consciousness. She reports having sharp pain in the left leg medially after the fall. She is not able to get to a phone and yell for help until her neighbors heard her. Pain was worsened by movement. Denies any recent changes in medications, previous fractures, lightheadedness, vomiting, palpitations, chest pain, abdominal pain, or diarrhea. Patient states that she is not had an alcoholic drink in over 10 years, tobacco, or illicit drug use. Patient notes that she has been short of breath for multiple weeks now and was recently referred to pulmonology, and is currently being set up to have PFT studies. Other associated symptoms include complaints of chronic nausea and dysuria.  Assessment & Plan:   Principal Problem:   Closed left hip fracture (HCC) Active Problems:   Hypokalemia   Hip fracture (HCC)   Dysuria   Fall   RLS (restless legs syndrome)   Left Hip fracture:  -Acute. Patient with a communicated displaced left intertrochanteric hip fracture -Admit to a telemetry bed at [facility] -Hip fracture protocol initiated -Status post ORIF with [employer] gamma nail done by Dr. Percell Miller.  Fall -Patient reported that she tripped on one of her cats, denies any prodromal symptoms prior to the fall. -Denies any loss of consciousness, denies any chest pain, shortness of breath or palpitations. -Patient is on telemetry, continue to monitor for 1 day but no further  workup for now.  UTI -Patient has dysuria and history of frequent UTIs. Urinalysis consistent with UTI. -Started on Rocephin, obtain urine culture and adjust antibiotics according to culture results.   Sinus tachycardia: Acute.   - Follow-up telemetry  Hypokalemia: -Potassium is 2.6, patient received 30 mEq of IV potassium and 40 mEq of oral potassium. -Replete with oral supplements, check BMP in a.m. Acid again is 3.1 this morning.  Leukocytosis: WBC elevated at 13.1 on admission. Patient reports some dysuria. - follow-up UA, if positive will check urine culture and treat with antibiotics of ceftriaxone  Hyperglycemia: Previous hemoglobin A1c was noted to be 5.5 in 11/2014.  - Continue to monitor  Chronic nausea - Zofran prn    RLS - continue Mirapex  Alcohol abuse history:  -Patient denies any alcohol use. -Slight elevation of ammonia at 50, does not appear to be confused or in acute hepatic encephalopathy. -Monitor closely.   DVT prophylaxis: Lovenox Code Status: Full Code Family Communication: I spoke with her daughter over the phone. Disposition Plan:  Diet: Diet regular Room service appropriate? Yes; Fluid consistency: Thin  Consultants:   Orthopedics  Procedures:   None  Antimicrobials:   Rocephin  Objective: Vitals:   11/29/15 1125 11/29/15 1140 11/29/15 1155 11/29/15 1205  BP: (!) 146/81 140/82 125/90   Pulse: (!) 110 (!) 109 (!) 112 (!) 112  Resp: 18 18 (!) 21 20  Temp:      TempSrc:      SpO2: 90% 94% 94% 95%  Weight:  Height:        Intake/Output Summary (Last 24 hours) at 11/29/15 1415 Last data filed at 11/29/15 1030  Gross per 24 hour  Intake           2852.5 ml  Output              450 ml  Net           2402.5 ml   Filed Weights   11/28/15 0530 11/29/15 0444  Weight: 98.4 kg (216 lb 14.4 oz) 98.6 kg (217 lb 4.8 oz)    Examination: General exam: Appears calm and comfortable  Respiratory system: Clear to auscultation.  Respiratory effort normal. Cardiovascular system: S1 & S2 heard, RRR. No JVD, murmurs, rubs, gallops or clicks. No pedal edema. Gastrointestinal system: Abdomen is nondistended, soft and nontender. No organomegaly or masses felt. Normal bowel sounds heard. Central nervous system: Alert and oriented. No focal neurological deficits. Extremities: Symmetric 5 x 5 power. Skin: No rashes, lesions or ulcers Psychiatry: Judgement and insight appear normal. Mood & affect appropriate.   Data Reviewed: I have personally reviewed following labs and imaging studies  CBC:  Recent Labs Lab 11/28/15 0146  WBC 13.1*  NEUTROABS 10.8*  HGB 14.8  HCT 42.6  MCV 91.0  PLT [id number]   Basic Metabolic Panel:  Recent Labs Lab 11/28/15 0146 11/28/15 1334 11/29/15 0538 11/29/15 0744  NA 137 138  --  141  K 2.6* 3.1*  --  3.2*  CL 99* 104  --  104  CO2 26 27  --  31  GLUCOSE 179* 150*  --  117*  BUN 11 11  --  6  CREATININE 0.84 0.86  --  0.73  CALCIUM 8.4* 8.2*  --  8.0*  MG  --   --  1.9  --    GFR: Estimated Creatinine Clearance: 73 mL/min (by C-G formula based on SCr of 0.73 mg/dL). Liver Function Tests: No results for input(s): AST, ALT, ALKPHOS, BILITOT, PROT, ALBUMIN in the last 168 hours. No results for input(s): LIPASE, AMYLASE in the last 168 hours.  Recent Labs Lab 11/29/15 0538  AMMONIA 50*   Coagulation Profile:  Recent Labs Lab 11/28/15 0146  INR 1.16   Cardiac Enzymes: No results for input(s): CKTOTAL, CKMB, CKMBINDEX, TROPONINI in the last 168 hours. BNP (last 3 results) No results for input(s): PROBNP in the last 8760 hours. HbA1C: No results for input(s): HGBA1C in the last 72 hours. CBG: No results for input(s): GLUCAP in the last 168 hours. Lipid Profile: No results for input(s): CHOL, HDL, LDLCALC, TRIG, CHOLHDL, LDLDIRECT in the last 72 hours. Thyroid Function Tests: No results for input(s): TSH, T4TOTAL, FREET4, T3FREE, THYROIDAB in the last 72 hours. Anemia  Panel: No results for input(s): VITAMINB12, FOLATE, FERRITIN, TIBC, IRON, RETICCTPCT in the last 72 hours. Urine analysis:    Component Value Date/Time   COLORURINE AMBER (A) 11/28/2015 0349   APPEARANCEUR CLOUDY (A) 11/28/2015 0349   LABSPEC 1.019 11/28/2015 0349   PHURINE 5.5 11/28/2015 0349   GLUCOSEU NEGATIVE 11/28/2015 0349   HGBUR LARGE (A) 11/28/2015 0349   BILIRUBINUR SMALL (A) 11/28/2015 0349   KETONESUR 15 (A) 11/28/2015 0349   PROTEINUR 30 (A) 11/28/2015 0349   UROBILINOGEN 1.0 11/30/2014 2105   NITRITE POSITIVE (A) 11/28/2015 0349   LEUKOCYTESUR MODERATE (A) 11/28/2015 0349   Sepsis Labs: @LABRCNTIP (procalcitonin:4,lacticidven:4)  ) Recent Results (from the past 240 hour(s))  Surgical pcr screen     Status: None  Collection Time: 11/28/15  5:30 AM  Result Value Ref Range Status   MRSA, PCR NEGATIVE NEGATIVE Final   Staphylococcus aureus NEGATIVE NEGATIVE Final    Comment:        The Xpert SA Assay (FDA approved for NASAL specimens in patients over 46 years of age), is one component of a comprehensive surveillance program.  Test performance has been validated by [facility] for patients greater than or equal to 22 year old. It is not intended to diagnose infection nor to guide or monitor treatment.      Invalid input(s): PROCALCITONIN, LACTICACIDVEN   Radiology Studies: Dg Chest Port 1 View  Result Date: 11/28/2015 CLINICAL DATA:  Dyspnea.  Left hip fracture. EXAM: PORTABLE CHEST 1 VIEW COMPARISON:  11/30/2014 FINDINGS: There is unchanged mild right hemidiaphragm elevation. There is unchanged moderate cardiomegaly. The lungs are clear. There is no large effusion. There is no pneumothorax. The pulmonary vasculature is normal. IMPRESSION: Unchanged cardiomegaly.  No acute cardiopulmonary findings. Electronically Signed   By: Andreas Newport M.D.   On: 11/28/2015 04:04   Dg C-arm 1-60 Min  Result Date: 11/29/2015 CLINICAL DATA:  ORIF of a left  intertrochanteric proximal femur fracture. EXAM: LEFT FEMUR 2 VIEWS; DG C-ARM 61-120 MIN COMPARISON:  11/28/2015 FINDINGS: Submitted portable fluoroscopic images show placement of an intra medullary rod, which extends to the distal femur, supporting a compression screw. This reduces the major fracture components into near anatomic alignment. No new fracture or evidence of an operative complication. IMPRESSION: Well aligned major fracture components following ORIF of the left proximal femur intertrochanteric fracture. Electronically Signed   By: Lajean Manes M.D.   On: 11/29/2015 10:47   Dg Hip Unilat With Pelvis 2-3 Views Left  Result Date: 11/28/2015 CLINICAL DATA:  Fall while walking to the bathroom onto left hip, deformity and pain. EXAM: DG HIP (WITH OR WITHOUT PELVIS) 2-3V LEFT COMPARISON:  None. FINDINGS: Comminuted displaced left hip intertrochanteric fracture with mild angulation and shortening of the femoral shaft. Lesser trochanteric component with at least 2 cm osseous distraction. No additional acute fracture of the bony pelvis. The bones are under mineralized. IMPRESSION: Comminuted displaced left intertrochanteric hip fracture. Electronically Signed   By: Jeb Levering M.D.   On: 11/28/2015 02:20   Dg Femur Min 2 Views Left  Result Date: 11/29/2015 CLINICAL DATA:  ORIF of a left intertrochanteric proximal femur fracture. EXAM: LEFT FEMUR 2 VIEWS; DG C-ARM 61-120 MIN COMPARISON:  11/28/2015 FINDINGS: Submitted portable fluoroscopic images show placement of an intra medullary rod, which extends to the distal femur, supporting a compression screw. This reduces the major fracture components into near anatomic alignment. No new fracture or evidence of an operative complication. IMPRESSION: Well aligned major fracture components following ORIF of the left proximal femur intertrochanteric fracture. Electronically Signed   By: Lajean Manes M.D.   On: 11/29/2015 10:47        Scheduled  Meds: . aspirin  81 mg Oral Daily  . cefTRIAXone (ROCEPHIN)  IV  1 g Intravenous Q24H  . Chlorhexidine Gluconate Cloth  6 each Topical Q0600  . cholecalciferol  1,000 Units Oral Daily  . enoxaparin (LOVENOX) injection  40 mg Subcutaneous Q24H  . potassium chloride  40 mEq Oral Q6H  . spironolactone  25 mg Oral Daily  . traZODone  100 mg Oral QHS   Continuous Infusions: . sodium chloride 75 mL/hr at 11/29/15 0344  . lactated ringers 10 mL/hr at 11/29/15 [phone]  LOS: 1 day    Time spent: 35 minutes    Norvell Ureste A, MD [facility] Pager [phone]  If 7PM-7AM, please contact night-coverage [email or web address] Password [email or web address] 11/29/2015, 2:15 PM

## 2015-11-29 NOTE — H&P (View-Only) (Signed)
ORTHOPAEDIC CONSULTATION  REQUESTING PHYSICIAN: Verlee Monte, MD  Chief Complaint: Left Hip pain  Assessment: Principal Problem:   Closed left hip fracture (HCC) Active Problems:   Hypokalemia   Hip fracture (HCC)   Dysuria   Fall   RLS (restless legs syndrome)   Plan: NPO after midnight. Medical optimization and surgical clearance. Plan for IM Nail Left Hip by Dr. Alain Marion A.M. Tomorrow - 11/29/15. Weight Bearing Status: NWB left leg VTE px: Lovenox, SCD's  Dispo: will likely need SNF postoperatively.  HPI: Denise Kelly is a 75 y.o. female with a history of HTN, hypokalemia, UTIs complains of  Left hip pain s/p mechanical fall from ground.  Multiple recent falls - notes tripping over cat.  No dizziness or LOC.  SOB recently has been in the work up process - was referred to pulmonology.  XR showed Comminuted displaced left intertrochanteric hip fracture. Orthopedics was consulted for evaluation.  Past Medical History:  Diagnosis Date  . Alcohol abuse    ETOH and xanax  . Anxiety   . Arthritis   . Cancer ([place])   . Complication of anesthesia    "felt drunk for a week after"  . Encephalopathy   . GERD (gastroesophageal reflux disease)   . Hypertension    not being treated with medication  . Palpitations    Past Surgical History:  Procedure Laterality Date  . BREAST SURGERY     2 breast biopsies  . carotid cavernous fistula     to block  . COLONOSCOPY    . EYE SURGERY Bilateral    cataracts  . JOINT REPLACEMENT Left 09/2009   knee  . JOINT REPLACEMENT Right 01/02/2014  . TONSILLECTOMY    . TOTAL KNEE ARTHROPLASTY Right 01/02/2014   Dr Ronnie Derby  . TOTAL KNEE ARTHROPLASTY Right 01/02/2014   Procedure: TOTAL KNEE ARTHROPLASTY;  Surgeon: [name], MD;  Location: [place];  Service: Orthopedics;  Laterality: Right;  . TUBAL LIGATION     Social History   Social History  . Marital status: Single    Spouse name: N/A  . Number of children: N/A  . Years of  education: N/A   Social History Main Topics  . Smoking status: Former Research scientist (life sciences)  . Smokeless tobacco: Never Used     Comment: quit smoking many years ago   . Alcohol use No  . Drug use: No  . Sexual activity: No   Other Topics Concern  . None   Social History Narrative  . None   No family history on file. Allergies  Allergen Reactions  . Sulfa Antibiotics Hives and Swelling  . Ciprofloxacin Itching  . Motrin [Ibuprofen] Palpitations   Prior to Admission medications   Medication Sig Start Date End Date Taking? Authorizing Provider  acetaminophen (TYLENOL) 500 MG tablet Take 1,000 mg by mouth daily as needed for mild pain.   Yes Historical Provider, MD  aspirin 81 MG tablet Take 81 mg by mouth daily.   Yes Historical Provider, MD  CALCIUM PO Take 1 tablet by mouth daily.   Yes Historical Provider, MD  cholecalciferol (VITAMIN D) 1000 UNITS tablet Take 1,000 Units by mouth daily.   Yes Historical Provider, MD  hydrOXYzine (ATARAX/VISTARIL) 25 MG tablet Take 50 mg by mouth at bedtime as needed for anxiety (sleep).  08/12/13  Yes Historical Provider, MD  loratadine (CLARITIN) 10 MG tablet Take 10 mg by mouth daily as needed for allergies or itching.    Yes  Historical Provider, MD  LUTEIN PO Take 1 tablet by mouth daily.    Yes Historical Provider, MD  omega-3 acid ethyl esters (LOVAZA) 1 G capsule Take 1 g by mouth daily.   Yes Historical Provider, MD  ondansetron (ZOFRAN-ODT) 4 MG disintegrating tablet Take 4 mg by mouth every 6 (six) hours as needed for nausea.  10/31/13  Yes Historical Provider, MD  pramipexole (MIRAPEX) 0.125 MG tablet Take 0.375 mg by mouth 2 (two) times daily as needed (restless leg).  10/11/15  Yes Historical Provider, MD  Probiotic Product (PROBIOTIC ADVANCED PO) Take 1 tablet by mouth daily.   Yes Historical Provider, MD  spironolactone (ALDACTONE) 25 MG tablet Take 25 mg by mouth daily.   Yes Historical Provider, MD  traZODone (DESYREL) 50 MG tablet Take 1 tablet  (50 mg total) by mouth at bedtime. Patient taking differently: Take 100 mg by mouth at bedtime.  02/24/14  Yes [name], MD  citalopram (CELEXA) 20 MG tablet Take 1 tablet (20 mg total) by mouth at bedtime. Patient not taking: Reported on 11/28/2015 02/24/14   [name], MD  feeding supplement, ENSURE COMPLETE, (ENSURE COMPLETE) LIQD Take 237 mLs by mouth 2 (two) times daily between meals. Patient not taking: Reported on 11/28/2015 03/15/14   [name], MD   Dg Chest Port 1 View  Result Date: 11/28/2015 CLINICAL DATA:  Dyspnea.  Left hip fracture. EXAM: PORTABLE CHEST 1 VIEW COMPARISON:  11/30/2014 FINDINGS: There is unchanged mild right hemidiaphragm elevation. There is unchanged moderate cardiomegaly. The lungs are clear. There is no large effusion. There is no pneumothorax. The pulmonary vasculature is normal. IMPRESSION: Unchanged cardiomegaly.  No acute cardiopulmonary findings. Electronically Signed   By: Andreas Newport M.D.   On: 11/28/2015 04:04   Dg Hip Unilat With Pelvis 2-3 Views Left  Result Date: 11/28/2015 CLINICAL DATA:  Fall while walking to the bathroom onto left hip, deformity and pain. EXAM: DG HIP (WITH OR WITHOUT PELVIS) 2-3V LEFT COMPARISON:  None. FINDINGS: Comminuted displaced left hip intertrochanteric fracture with mild angulation and shortening of the femoral shaft. Lesser trochanteric component with at least 2 cm osseous distraction. No additional acute fracture of the bony pelvis. The bones are under mineralized. IMPRESSION: Comminuted displaced left intertrochanteric hip fracture. Electronically Signed   By: Jeb Levering M.D.   On: 11/28/2015 02:20    Positive ROS: All other systems have been reviewed and were otherwise negative with the exception of those mentioned in the HPI and as above.  Objective: Labs cbc  Recent Labs  11/28/15 0146  WBC 13.1*  HGB 14.8  HCT 42.6  PLT 159    Labs coag  Recent Labs  11/28/15 0146  INR 1.16     Recent  Labs  11/28/15 0146  NA 137  K 2.6*  CL 99*  CO2 26  GLUCOSE 179*  BUN 11  CREATININE 0.84  CALCIUM 8.4*    Physical Exam: Vitals:   11/28/15 0407 11/28/15 0530  BP: 136/90 126/84  Pulse: 114 (!) 115  Resp: 18 18  Temp:  99 F (37.2 C)   General: Alert, no acute distress Mental status: Alert and Oriented x3 Neurologic: Speech Clear and organized, no gross focal findings or movement disorder appreciated. Respiratory: No cyanosis, no use of accessory musculature Cardiovascular: No pedal edema GI: Abdomen is soft and non-tender, non-distended. Skin: Warm and dry.  No lesions in the area of chief complaint Extremities: Warm and dry Psychiatric: Patient is competent for consent  with normal mood and affect  MUSCULOSKELETAL:  Left leg externally rotated.  Left hip pain w/ rom.  NVI distally.  Distal sensation intact. Other extremities are atraumatic with painless ROM and NVI.  Prudencio Burly III PA-C 11/28/2015 8:11 AM

## 2015-11-29 NOTE — Anesthesia Postprocedure Evaluation (Signed)
Anesthesia Post Note  Patient: Denise Kelly  Procedure(s) Performed: Procedure(s) (LRB): LEFT HIP   NAIL (Left)  Patient location during evaluation: PACU Anesthesia Type: General Level of consciousness: awake and alert Pain management: pain level controlled Vital Signs Assessment: post-procedure vital signs reviewed and stable Respiratory status: spontaneous breathing, nonlabored ventilation, respiratory function stable and patient connected to nasal cannula oxygen Cardiovascular status: blood pressure returned to baseline and stable Postop Assessment: no signs of nausea or vomiting Anesthetic complications: no    Last Vitals:  Vitals:   11/29/15 1155 11/29/15 1205  BP: 125/90   Pulse: (!) 112 (!) 112  Resp: (!) 21 20  Temp:      Last Pain:  Vitals:   11/29/15 1225  TempSrc:   PainSc: 3                  Tiajuana Amass

## 2015-11-29 NOTE — Clinical Social Work Note (Signed)
CSW acknowledges SNF consult. Waiting on PT evaluation.  Dayton Scrape, [place]

## 2015-11-29 NOTE — Transfer of Care (Signed)
Immediate Anesthesia Transfer of Care Note  Patient: [name]  Procedure(s) Performed: Procedure(s): LEFT HIP   NAIL (Left)  Patient Location: PACU  Anesthesia Type:General  Level of Consciousness: awake, alert , oriented and sedated  Airway & Oxygen Therapy: Patient Spontanous Breathing and Patient connected to nasal cannula oxygen  Post-op Assessment: Report given to RN, Post -op Vital signs reviewed and stable and Patient moving all extremities  Post vital signs: Reviewed and stable  Last Vitals:  Vitals:   11/28/15 2346 11/29/15 0444  BP: (!) 141/83 130/79  Pulse: 99 (!) 107  Resp: 18 18  Temp: 36.7 C 37.1 C    Last Pain:  Vitals:   11/29/15 0444  TempSrc: Oral  PainSc: Asleep      Patients Stated Pain Goal: 0 ([id number])  Complications: No apparent anesthesia complications

## 2015-11-29 NOTE — Anesthesia Preprocedure Evaluation (Signed)
Anesthesia Evaluation  Patient identified by MRN, date of birth, ID band Patient awake    Reviewed: Allergy & Precautions, NPO status , Patient's Chart, lab work & pertinent test results  Airway Mallampati: III  TM Distance: >3 FB Neck ROM: Full    Dental  (+) Dental Advisory Given   Pulmonary former smoker,    breath sounds clear to auscultation       Cardiovascular hypertension, Pt. on medications + dysrhythmias  Rhythm:Regular Rate:Normal     Neuro/Psych Anxiety negative neurological ROS     GI/Hepatic Neg liver ROS, GERD  ,  Endo/Other  negative endocrine ROS  Renal/GU negative Renal ROS     Musculoskeletal  (+) Arthritis ,   Abdominal   Peds  Hematology negative hematology ROS (+)   Anesthesia Other Findings   Reproductive/Obstetrics                             Lab Results  Component Value Date   WBC 13.1 (H) 11/28/2015   HGB 14.8 11/28/2015   HCT 42.6 11/28/2015   MCV 91.0 11/28/2015   PLT 159 11/28/2015   Lab Results  Component Value Date   CREATININE 0.86 11/28/2015   BUN 11 11/28/2015   NA 138 11/28/2015   K 3.1 (L) 11/28/2015   CL 104 11/28/2015   CO2 27 11/28/2015    Anesthesia Physical Anesthesia Plan  ASA: III  Anesthesia Plan: General   Post-op Pain Management:    Induction: Intravenous  Airway Management Planned: Oral ETT  Additional Equipment:   Intra-op Plan:   Post-operative Plan: Extubation in OR  Informed Consent: I have reviewed the patients History and Physical, chart, labs and discussed the procedure including the risks, benefits and alternatives for the proposed anesthesia with the patient or authorized representative who has indicated his/her understanding and acceptance.   Dental advisory given  Plan Discussed with: CRNA  Anesthesia Plan Comments:         Anesthesia Quick Evaluation

## 2015-11-30 LAB — BASIC METABOLIC PANEL
ANION GAP: 6 (ref 5–15)
CHLORIDE: 104 mmol/L (ref 101–111)
CO2: 32 mmol/L (ref 22–32)
Calcium: 8.1 mg/dL — ABNORMAL LOW (ref 8.9–10.3)
Creatinine, Ser: 0.73 mg/dL (ref 0.44–1.00)
GFR calc Af Amer: >60 mL/min (ref >60)
GLUCOSE: 120 mg/dL — AB (ref 65–99)
POTASSIUM: 3.7 mmol/L (ref 3.5–5.1)
Sodium: 142 mmol/L (ref 135–145)

## 2015-11-30 MED ORDER — CITALOPRAM HYDROBROMIDE 20 MG PO TABS
20.0000 mg | ORAL_TABLET | Freq: Every day | ORAL | Status: DC
  Administered 2015-11-30: 20 mg via ORAL
  Filled 2015-11-30: qty 1

## 2015-11-30 MED ORDER — PHENAZOPYRIDINE HCL 200 MG PO TABS
200.0000 mg | ORAL_TABLET | Freq: Three times a day (TID) | ORAL | Status: DC
  Administered 2015-11-30: 200 mg via ORAL
  Filled 2015-11-30 (×2): qty 1

## 2015-11-30 MED ORDER — PHENAZOPYRIDINE HCL 100 MG PO TABS
100.0000 mg | ORAL_TABLET | Freq: Three times a day (TID) | ORAL | Status: DC
  Filled 2015-11-30 (×2): qty 1

## 2015-11-30 MED ORDER — POTASSIUM CHLORIDE CRYS ER 20 MEQ PO TBCR
40.0000 meq | EXTENDED_RELEASE_TABLET | Freq: Once | ORAL | Status: AC
  Administered 2015-11-30: 40 meq via ORAL
  Filled 2015-11-30: qty 2

## 2015-11-30 NOTE — Evaluation (Signed)
Physical Therapy Evaluation Patient Details Name: Denise Kelly MRN: [id number] DOB: 10-21-1940 Today's Date: 11/30/2015   History of Present Illness  75yo F adm s/p fall resulting in L hip fx. S/p IM nail L hip fx on 11/29/15.  Clinical Impression  Pt admitted with above diagnosis. Pt currently with functional limitations due to the deficits listed below (see PT Problem List). Pt needed a lot of asssit as she is very self limiting due to pain. She had had Vicodin but was still in pain.  Pt needs max encouragement to move and wants long explanations prior to moving.  Pt will need SNF for therapy as anticipate slow progress. Took 15 min to get pt to EOB and 15 min to get her back to bed due to pain.  Nurse did bring morphine as well.  Pt will benefit from skilled PT to increase their independence and safety with mobility to allow discharge to the venue listed below.      Follow Up Recommendations SNF;Supervision/Assistance - 24 hour    Equipment Recommendations  Other (comment) (TBA)    Recommendations for Other Services       Precautions / Restrictions Precautions Precautions: Fall Restrictions Weight Bearing Restrictions: Yes LLE Weight Bearing: Weight bearing as tolerated      Mobility  Bed Mobility Overal bed mobility: Needs Assistance;+2 for physical assistance;+ 2 for safety/equipment Bed Mobility: Supine to Sit;Sit to Supine     Supine to sit: Max assist;+2 for physical assistance;+2 for safety/equipment;HOB elevated Sit to supine: Max assist;+2 for physical assistance;+2 for safety/equipment   General bed mobility comments: needed A with all aspects, verbal cues, pt able to help a little bit.  Pt took 15 min to encourage to come to EOB and 15 min to lie down.  Transfers Overall transfer level: Needs assistance Equipment used: Rolling walker (2 wheeled) Transfers: Sit to/from Stand Sit to Stand: Max assist;+2 physical assistance;+2 safety/equipment;From elevated  surface         General transfer comment: needs assistance with all aspects plus verbal cues technique.  Pt needed assist with power up.  Stood for 20 seconds and then sat back on bed.  Refused to do any more at that point.   Ambulation/Gait                Stairs            Wheelchair Mobility    Modified Rankin (Stroke Patients Only)       Balance Overall balance assessment: Needs assistance;History of Falls Sitting-balance support: Bilateral upper extremity supported;Feet supported Sitting balance-Leahy Scale: Poor Sitting balance - Comments: Used UEs on bedrail while sitting.  There were brief periods of time pt would let go of bed rail but not for long.   Standing balance support: Bilateral upper extremity supported;During functional activity Standing balance-Leahy Scale: Poor Standing balance comment: Pt heavily reliant on RW for balanc.e                              Pertinent Vitals/Pain Pain Assessment: 0-10 Pain Score: 8  Pain Location: left hip Pain Descriptors / Indicators: Grimacing;Guarding;Operative site guarding;Crying Pain Intervention(s): Limited activity within patient's tolerance;Monitored during session;Patient requesting pain meds-RN notified;RN gave pain meds during session;Repositioned;Utilized relaxation techniques  On arrival, O2 had been removed by pt and she her sats were 85-87%.  REplaced O2 at 2L and sats 94% rest of treatment.  C/o dizziness with standing and BP  was soft at 106/84.    Home Living Family/patient expects to be discharged to:: Skilled nursing facility                 Additional Comments: PTA, pt lives alone, has 2 canes and a walker, was I without AD, I ADLs. Daughter lives close by, but works. No 24/7 A available. Has tub/shower, regular toilet, 1 grab bar in bathroom.    Prior Function Level of Independence: Independent               Hand Dominance   Dominant Hand: Right     Extremity/Trunk Assessment   Upper Extremity Assessment: Defer to OT evaluation           Lower Extremity Assessment: RLE deficits/detail;LLE deficits/detail RLE Deficits / Details: grossly 3-/5 LLE Deficits / Details: grossly 2-/5  Cervical / Trunk Assessment: Kyphotic  Communication   Communication: HOH  Cognition Arousal/Alertness: Awake/alert Behavior During Therapy: Anxious Overall Cognitive Status: Within Functional Limits for tasks assessed                      General Comments General comments (skin integrity, edema, etc.): pt itchy    Exercises     Assessment/Plan    PT Assessment Patient needs continued PT services  PT Problem List Decreased activity tolerance;Decreased balance;Decreased mobility;Decreased strength;Decreased range of motion;Decreased knowledge of use of DME;Decreased safety awareness;Decreased knowledge of precautions;Pain;Obesity          PT Treatment Interventions DME instruction;Gait training;Functional mobility training;Therapeutic activities;Therapeutic exercise;Balance training;Patient/family education;Cognitive remediation    PT Goals (Current goals can be found in the Care Plan section)  Acute Rehab PT Goals Patient Stated Goal: "to be functional using bedside commode" PT Goal Formulation: With patient/family Time For Goal Achievement: 12/14/15 Potential to Achieve Goals: Fair    Frequency Min 5X/week   Barriers to discharge Decreased caregiver support      Co-evaluation PT/OT/SLP Co-Evaluation/Treatment: Yes Reason for Co-Treatment: For patient/therapist safety PT goals addressed during session: Mobility/safety with mobility OT goals addressed during session: ADL's and self-care       End of Session Equipment Utilized During Treatment: Gait belt;Oxygen Activity Tolerance: Patient limited by pain;Patient limited by fatigue (pt self limiting) Patient left: in bed;with call bell/phone within reach;with bed alarm  set;with family/visitor present Nurse Communication: Mobility status;Need for lift equipment         Time: 1150-1246 PT Time Calculation (min) (ACUTE ONLY): 56 min   Charges:   PT Evaluation $PT Eval High Complexity: 1 Procedure PT Treatments $Therapeutic Activity: 8-22 mins   PT G CodesDenice Paradise 2015-12-15, 2:47 PM Ramaya Guile,PT Acute Rehabilitation [phone] (pager)

## 2015-11-30 NOTE — NC FL2 (Signed)
[place] LEVEL OF CARE SCREENING TOOL     IDENTIFICATION  Patient Name: Denise Kelly Birthdate: 1940/06/16 Sex: female Admission Date (Current Location): 11/28/2015  [facility] and [place] Number:  Herbalist and Address:  [facility] [place], [place] [address] [place], [place] [address]      Provider Number: [id number]  Attending Physician Name and Address:  Verlee Monte, MD  Relative Name and Phone Number:       Current Level of Care: Hospital Recommended Level of Care: [facility] Prior Approval Number:    Date Approved/Denied:   PASRR Number:    Discharge Plan: SNF    Current Diagnoses: Patient Active Problem List   Diagnosis Date Noted  . Hip fracture ([place]) 11/28/2015  . Closed left hip fracture ([place]) 11/28/2015  . Dysuria 11/28/2015  . Fall 11/28/2015  . RLS (restless legs syndrome) 11/28/2015  . Altered mental state 11/30/2014  . Hypokalemia 03/14/2014  . Acute encephalopathy 03/14/2014  . UTI (lower urinary tract infection) 03/14/2014  . Diarrhea 03/14/2014  . Effusion of right knee 02/22/2014  . Alcohol dependence ([place]) 02/22/2014  . Substance induced mood disorder ([place]) 02/22/2014  . Hepatic encephalopathy ([place])   . Altered mental status 02/21/2014  . SIRS (systemic inflammatory response syndrome) ([place]) 02/21/2014  . Complicated UTI (urinary tract infection)   . E coli bacteremia   . Sepsis due to Escherichia coli ([place])   . Screen for STD (sexually transmitted disease)   . S/P total knee arthroplasty 01/02/2014  . Right knee pain 11/10/2013  . Essential hypertension, benign 11/10/2013  . LAFB (left anterior fascicular block) 11/10/2013  . Obesity, unspecified 11/10/2013    Orientation RESPIRATION BLADDER Height & Weight     Self, Situation  Normal Continent Weight: 222 lb (100.7 kg) (bed scale; reweighed twice) Height:  5\' 6"  (167.6 cm)  BEHAVIORAL SYMPTOMS/MOOD NEUROLOGICAL BOWEL NUTRITION  STATUS      Continent  (nORMAL)  AMBULATORY STATUS COMMUNICATION OF NEEDS Skin     Verbally Normal                       Personal Care Assistance Level of Assistance  Bathing, Dressing Bathing Assistance: Maximum assistance   Dressing Assistance: Maximum assistance     Functional Limitations Info             SPECIAL CARE FACTORS FREQUENCY                       Contractures      Additional Factors Info                  Current Medications (11/30/2015):  This is the current hospital active medication list Current Facility-Administered Medications  Medication Dose Route Frequency Provider Last Rate Last Dose  . 0.9 %  sodium chloride infusion   Intravenous Continuous Verlee Monte, MD 10 mL/hr at 11/29/15 1414    . acetaminophen (TYLENOL) tablet 1,000 mg  1,000 mg Oral Daily PRN Norval Morton, MD   1,000 mg at 11/28/15 1028  . aspirin chewable tablet 81 mg  81 mg Oral Daily Norval Morton, MD   81 mg at 11/30/15 0931  . cefTRIAXone (ROCEPHIN) 1 g in dextrose 5 % 50 mL IVPB  1 g Intravenous Q24H Norval Morton, MD   1 g at 11/30/15 0728  . cholecalciferol (VITAMIN D) tablet 1,000 Units  1,000 Units Oral  Daily Norval Morton, MD   1,000 Units at 11/30/15 [phone]  . enoxaparin (LOVENOX) injection 40 mg  40 mg Subcutaneous Q24H Norval Morton, MD   40 mg at 11/29/15 1643  . HYDROcodone-acetaminophen (NORCO/VICODIN) 5-325 MG per tablet 1-2 tablet  1-2 tablet Oral Q6H PRN Norval Morton, MD   2 tablet at 11/30/15 0934  . hydrOXYzine (ATARAX/VISTARIL) tablet 50 mg  50 mg Oral QHS PRN Norval Morton, MD      . lactated ringers infusion   Intravenous Continuous Suzette Battiest, MD 10 mL/hr at 11/29/15 [phone]    . loratadine (CLARITIN) tablet 10 mg  10 mg Oral Daily PRN Norval Morton, MD      . morphine 2 MG/ML injection 0.5 mg  0.5 mg Intravenous Q2H PRN Norval Morton, MD   0.5 mg at 11/30/15 1525  . ondansetron (ZOFRAN) injection 4 mg  4 mg Intravenous Q6H PRN  Verlee Monte, MD   4 mg at 11/30/15 0954  . polyethylene glycol (MIRALAX / GLYCOLAX) packet 17 g  17 g Oral Daily Verlee Monte, MD   17 g at 11/30/15 0931  . potassium chloride SA (K-DUR,KLOR-CON) CR tablet 40 mEq  40 mEq Oral Once Verlee Monte, MD      . pramipexole (MIRAPEX) tablet 0.375 mg  0.375 mg Oral BID PRN Norval Morton, MD      . spironolactone (ALDACTONE) tablet 25 mg  25 mg Oral Daily Norval Morton, MD   25 mg at 11/30/15 0931  . traZODone (DESYREL) tablet 100 mg  100 mg Oral QHS Norval Morton, MD   100 mg at 11/29/15 2050     Discharge Medications: Please see discharge summary for a list of discharge medications.  Relevant Imaging Results:  Relevant Lab Results:   Additional Information  (SS: [id number])  Venetia Maxon, Lynnda Child

## 2015-11-30 NOTE — Progress Notes (Signed)
PROGRESS NOTE  Denise Kelly  [id number] DOB: October 03, 1940 DOA: 11/28/2015 PCP: Jonathon Bellows, MD Outpatient Specialists:  Subjective: Feels okay, denies any complaints this morning. No bowel movement started on MiraLAX, pain is controlled.  Brief Narrative:  Denise Kelly is a 75 y.o. female with medical history significant of HTN, hypokalemia, UTIs; who presents after having a fall at home. Patient notes that she had gotten up to use the bathroom last night and tripped on the rug and fell on her left leg. Patient is unsure if hit her head or lost consciousness. She reports having sharp pain in the left leg medially after the fall. She is not able to get to a phone and yell for help until her neighbors heard her. Pain was worsened by movement. Denies any recent changes in medications, previous fractures, lightheadedness, vomiting, palpitations, chest pain, abdominal pain, or diarrhea. Patient states that she is not had an alcoholic drink in over 10 years, tobacco, or illicit drug use. Patient notes that she has been short of breath for multiple weeks now and was recently referred to pulmonology, and is currently being set up to have PFT studies. Other associated symptoms include complaints of chronic nausea and dysuria.  Assessment & Plan:   Principal Problem:   Closed left hip fracture (HCC) Active Problems:   Hypokalemia   Hip fracture (HCC)   Dysuria   Fall   RLS (restless legs syndrome)   Left Hip fracture:  -Acute. Patient with a communicated displaced left intertrochanteric hip fracture -Admit to a telemetry bed at [facility] -Hip fracture protocol initiated -Status post ORIF with Stryker gamma nail done by Dr. Percell Miller. -Patient is controlled with opioids. -DVT prophylaxis with Lovenox.  Fall -Patient reported that she tripped on one of her cats, denies any prodromal symptoms prior to the fall. -Denies any loss of consciousness, denies any chest pain, shortness of breath  or palpitations. -Patient is on telemetry, continue to monitor for 1 day but no further workup for now.  UTI -Patient has dysuria and history of frequent UTIs. Urinalysis consistent with UTI. -Started on Rocephin, obtain urine culture and adjust antibiotics according to culture results.   Sinus tachycardia: Acute.   - Follow-up telemetry  Hypokalemia: -Potassium is 2.6, patient received 30 mEq of IV potassium and 40 mEq of oral potassium. -Repleted with oral supplements.  Leukocytosis: WBC elevated at 13.1 on admission. Patient reports some dysuria. - follow-up UA, if positive will check urine culture and treat with antibiotics of ceftriaxone  Hyperglycemia: Previous hemoglobin A1c was noted to be 5.5 in 11/2014.  - Continue to monitor  Chronic nausea - Zofran prn    RLS - continue Mirapex  Alcohol abuse history:  -Patient denies any alcohol use. -Slight elevation of ammonia at 50, does not appear to be confused or in acute hepatic encephalopathy. -Monitor closely.   DVT prophylaxis: Lovenox Code Status: Full Code Family Communication: I spoke with her daughter over the phone. Disposition Plan:  Diet: Diet regular Room service appropriate? Yes; Fluid consistency: Thin  Consultants:   Orthopedics  Procedures:   None  Antimicrobials:   Rocephin  Objective: Vitals:   11/30/15 0132 11/30/15 0340 11/30/15 0423 11/30/15 0928  BP:  136/77  114/74  Pulse: (!) 103 (!) 105  (!) 102  Resp:  20    Temp:  98.2 F (36.8 C)    TempSrc:  Oral    SpO2:  96%    Weight:   100.7 kg (222  lb)   Height:        Intake/Output Summary (Last 24 hours) at 11/30/15 1131 Last data filed at 11/30/15 0853  Gross per 24 hour  Intake          1231.83 ml  Output             1050 ml  Net           181.83 ml   Filed Weights   11/28/15 0530 11/29/15 0444 11/30/15 0423  Weight: 98.4 kg (216 lb 14.4 oz) 98.6 kg (217 lb 4.8 oz) 100.7 kg (222 lb)    Examination: General exam:  Appears calm and comfortable  Respiratory system: Clear to auscultation. Respiratory effort normal. Cardiovascular system: S1 & S2 heard, RRR. No JVD, murmurs, rubs, gallops or clicks. No pedal edema. Gastrointestinal system: Abdomen is nondistended, soft and nontender. No organomegaly or masses felt. Normal bowel sounds heard. Central nervous system: Alert and oriented. No focal neurological deficits. Extremities: Symmetric 5 x 5 power. Skin: No rashes, lesions or ulcers Psychiatry: Judgement and insight appear normal. Mood & affect appropriate.   Data Reviewed: I have personally reviewed following labs and imaging studies  CBC:  Recent Labs Lab 11/28/15 0146  WBC 13.1*  NEUTROABS 10.8*  HGB 14.8  HCT 42.6  MCV 91.0  PLT [id number]   Basic Metabolic Panel:  Recent Labs Lab 11/28/15 0146 11/28/15 1334 11/29/15 0538 11/29/15 0744 11/30/15 0901  NA 137 138  --  141 142  K 2.6* 3.1*  --  3.2* 3.7  CL 99* 104  --  104 104  CO2 26 27  --  31 32  GLUCOSE 179* 150*  --  117* 120*  BUN 11 11  --  6 <5*  CREATININE 0.84 0.86  --  0.73 0.73  CALCIUM 8.4* 8.2*  --  8.0* 8.1*  MG  --   --  1.9  --   --    GFR: Estimated Creatinine Clearance: 73.9 mL/min (by C-G formula based on SCr of 0.73 mg/dL). Liver Function Tests: No results for input(s): AST, ALT, ALKPHOS, BILITOT, PROT, ALBUMIN in the last 168 hours. No results for input(s): LIPASE, AMYLASE in the last 168 hours.  Recent Labs Lab 11/29/15 0538  AMMONIA 50*   Coagulation Profile:  Recent Labs Lab 11/28/15 0146  INR 1.16   Cardiac Enzymes: No results for input(s): CKTOTAL, CKMB, CKMBINDEX, TROPONINI in the last 168 hours. BNP (last 3 results) No results for input(s): PROBNP in the last 8760 hours. HbA1C: No results for input(s): HGBA1C in the last 72 hours. CBG: No results for input(s): GLUCAP in the last 168 hours. Lipid Profile: No results for input(s): CHOL, HDL, LDLCALC, TRIG, CHOLHDL, LDLDIRECT in the last  72 hours. Thyroid Function Tests: No results for input(s): TSH, T4TOTAL, FREET4, T3FREE, THYROIDAB in the last 72 hours. Anemia Panel: No results for input(s): VITAMINB12, FOLATE, FERRITIN, TIBC, IRON, RETICCTPCT in the last 72 hours. Urine analysis:    Component Value Date/Time   COLORURINE AMBER (A) 11/28/2015 0349   APPEARANCEUR CLOUDY (A) 11/28/2015 0349   LABSPEC 1.019 11/28/2015 0349   PHURINE 5.5 11/28/2015 0349   GLUCOSEU NEGATIVE 11/28/2015 0349   HGBUR LARGE (A) 11/28/2015 0349   BILIRUBINUR SMALL (A) 11/28/2015 0349   KETONESUR 15 (A) 11/28/2015 0349   PROTEINUR 30 (A) 11/28/2015 0349   UROBILINOGEN 1.0 11/30/2014 2105   NITRITE POSITIVE (A) 11/28/2015 0349   LEUKOCYTESUR MODERATE (A) 11/28/2015 0349   Sepsis Labs: @LABRCNTIP (procalcitonin:4,lacticidven:4)  )  Recent Results (from the past 240 hour(s))  Surgical pcr screen     Status: None   Collection Time: 11/28/15  5:30 AM  Result Value Ref Range Status   MRSA, PCR NEGATIVE NEGATIVE Final   Staphylococcus aureus NEGATIVE NEGATIVE Final    Comment:        The Xpert SA Assay (FDA approved for NASAL specimens in patients over 49 years of age), is one component of a comprehensive surveillance program.  Test performance has been validated by [facility] for patients greater than or equal to 15 year old. It is not intended to diagnose infection nor to guide or monitor treatment.      Invalid input(s): PROCALCITONIN, LACTICACIDVEN   Radiology Studies: Dg C-arm 1-60 Min  Result Date: 11/29/2015 CLINICAL DATA:  ORIF of a left intertrochanteric proximal femur fracture. EXAM: LEFT FEMUR 2 VIEWS; DG C-ARM 61-120 MIN COMPARISON:  11/28/2015 FINDINGS: Submitted portable fluoroscopic images show placement of an intra medullary rod, which extends to the distal femur, supporting a compression screw. This reduces the major fracture components into near anatomic alignment. No new fracture or evidence of an operative  complication. IMPRESSION: Well aligned major fracture components following ORIF of the left proximal femur intertrochanteric fracture. Electronically Signed   By: Lajean Manes M.D.   On: 11/29/2015 10:47   Dg Femur Min 2 Views Left  Result Date: 11/29/2015 CLINICAL DATA:  ORIF of a left intertrochanteric proximal femur fracture. EXAM: LEFT FEMUR 2 VIEWS; DG C-ARM 61-120 MIN COMPARISON:  11/28/2015 FINDINGS: Submitted portable fluoroscopic images show placement of an intra medullary rod, which extends to the distal femur, supporting a compression screw. This reduces the major fracture components into near anatomic alignment. No new fracture or evidence of an operative complication. IMPRESSION: Well aligned major fracture components following ORIF of the left proximal femur intertrochanteric fracture. Electronically Signed   By: Lajean Manes M.D.   On: 11/29/2015 10:47        Scheduled Meds: . aspirin  81 mg Oral Daily  . cefTRIAXone (ROCEPHIN)  IV  1 g Intravenous Q24H  . cholecalciferol  1,000 Units Oral Daily  . enoxaparin (LOVENOX) injection  40 mg Subcutaneous Q24H  . polyethylene glycol  17 g Oral Daily  . spironolactone  25 mg Oral Daily  . traZODone  100 mg Oral QHS   Continuous Infusions: . sodium chloride 10 mL/hr at 11/29/15 1414  . lactated ringers 10 mL/hr at 11/29/15 0851     LOS: 2 days    Time spent: 35 minutes    Yuepheng Schaller A, MD [facility] Pager [phone]  If 7PM-7AM, please contact night-coverage [email or web address] Password [facility] 11/30/2015, 11:31 AM

## 2015-11-30 NOTE — Progress Notes (Signed)
Occupational Therapy Evaluation Patient Details Name: Denise Kelly MRN: [id number] DOB: February 17, 1941 Today's Date: 11/30/2015    History of Present Illness 75yo F adm s/p fall resulting in L hip fx. S/p IM nail L hip fx on 11/29/15.   Clinical Impression   Patient very anxious during evaluation, requiring explanation and increased time for all movement. She requires +2 max A with all mobility at this time and total A with most ADLs with the exception of feeding/grooming. She will benefit from skilled OT to maximize function and to facilitate discharge to the venue listed below. OT will follow.    Follow Up Recommendations  SNF    Equipment Recommendations  Other (comment) (tbd at next venue of care)    Recommendations for Other Services       Precautions / Restrictions Precautions Precautions: Fall Restrictions Weight Bearing Restrictions: Yes LLE Weight Bearing: Weight bearing as tolerated      Mobility Bed Mobility Overal bed mobility: Needs Assistance;+2 for physical assistance;+ 2 for safety/equipment Bed Mobility: Supine to Sit;Sit to Supine     Supine to sit: Max assist;+2 for physical assistance;+2 for safety/equipment;HOB elevated Sit to supine: Max assist;+2 for physical assistance;+2 for safety/equipment   General bed mobility comments: needed A with all aspects, verbal cues, pt able to help a little bit  Transfers Overall transfer level: Needs assistance Equipment used: Rolling walker (2 wheeled) Transfers: Sit to/from Stand Sit to Stand: Max assist;+2 physical assistance;+2 safety/equipment;From elevated surface         General transfer comment: needs assistance with all aspects plus verbal cues technique    Balance                                            ADL Overall ADL's : Needs assistance/impaired Eating/Feeding: Set up;Bed level   Grooming: Wash/dry hands;Wash/dry face;Set up;Sitting   Upper Body Bathing: Total  assistance;Bed level   Lower Body Bathing: Total assistance;Bed level   Upper Body Dressing : Total assistance;Bed level   Lower Body Dressing: Total assistance;Bed level     Toilet Transfer Details (indicate cue type and reason): pt unable to transfer to [facility] at this time Toileting- Clothing Manipulation and Hygiene: Total assistance;Bed level       Functional mobility during ADLs: Maximal assistance;+2 for physical assistance;+2 for safety/equipment;Rolling walker       Vision     Perception     Praxis      Pertinent Vitals/Pain Pain Assessment: 0-10 Pain Score: 8  Pain Location: L hip Pain Descriptors / Indicators: Grimacing;Crying;Operative site guarding Pain Intervention(s): Limited activity within patient's tolerance;Monitored during session;Repositioned;Patient requesting pain meds-RN notified;RN gave pain meds during session     Hand Dominance     Extremity/Trunk Assessment Upper Extremity Assessment Upper Extremity Assessment: Overall WFL for tasks assessed   Lower Extremity Assessment Lower Extremity Assessment: Defer to PT evaluation       Communication Communication Communication: HOH   Cognition Arousal/Alertness: Awake/alert Behavior During Therapy: Anxious Overall Cognitive Status: Within Functional Limits for tasks assessed                     General Comments       Exercises       Shoulder Instructions      Home Living Family/patient expects to be discharged to:: Skilled nursing facility  Additional Comments: PTA, pt lives alone, has 2 canes and a walker, was I without AD, I ADLs. Daughter lives close by, but works. No 24/7 A available. Has tub/shower, regular toilet, 1 grab bar in bathroom.      Prior Functioning/Environment Level of Independence: Independent             OT Diagnosis:   Fall, initial encounter  Closed left hip fracture, initial encounter  ([place])  Hypokalemia  SOB (shortness of breath) - Plan: DG CHEST PORT 1 VIEW, DG CHEST PORT 1 VIEW  Fall - Plan: CANCELED: Chest Portable 1 View, CANCELED: Chest Portable 1 View  Closed left hip fracture (HCC)  Surgery, elective - Plan: DG FEMUR MIN 2 VIEWS LEFT, DG FEMUR MIN 2 VIEWS LEFT, CANCELED: DG FEMUR, MIN 2 VIEWS RIGHT, CANCELED: DG FEMUR, MIN 2 VIEWS RIGHT    OT Problem List: Decreased strength;Decreased range of motion;Decreased activity tolerance;Impaired balance (sitting and/or standing);Decreased safety awareness;Decreased knowledge of use of DME or AE;Pain   OT Treatment/Interventions: Self-care/ADL training;DME and/or AE instruction;Therapeutic activities;Patient/family education    OT Goals(Current goals can be found in the care plan section) Acute Rehab OT Goals Patient Stated Goal: "to be functional using bedside commode" OT Goal Formulation: With patient Time For Goal Achievement: 12/14/15 Potential to Achieve Goals: Good ADL Goals Pt Will Perform Upper Body Bathing: with min assist;sitting Pt Will Perform Lower Body Bathing: with mod assist;with adaptive equipment;sit to/from stand Pt Will Perform Upper Body Dressing: with min assist;sitting Pt Will Perform Lower Body Dressing: with mod assist;with adaptive equipment;sit to/from stand Pt Will Transfer to Toilet: with mod assist;bedside commode Pt Will Perform Toileting - Clothing Manipulation and hygiene: with mod assist;sit to/from stand  OT Frequency: Min 2X/week   Barriers to D/C: Decreased caregiver support  lives alone, no 24/7 A available       Co-evaluation PT/OT/SLP Co-Evaluation/Treatment: Yes Reason for Co-Treatment: For patient/therapist safety PT goals addressed during session: Mobility/safety with mobility OT goals addressed during session: ADL's and self-care      End of Session Equipment Utilized During Treatment: Rolling walker Nurse Communication: Patient requests pain meds  Activity  Tolerance: Patient limited by pain;Other (comment) (pt limited by anxiety) Patient left: in bed;with call bell/phone within reach;with bed alarm set   Time: 1150-1246 OT Time Calculation (min): 56 min Charges:  OT General Charges $OT Visit: 1 Procedure OT Evaluation $OT Eval Moderate Complexity: 1 Procedure OT Treatments $Therapeutic Activity: 8-22 mins G-Codes:    Lilyrose Tanney A 2015/12/01, 1:19 PM

## 2015-11-30 NOTE — Progress Notes (Signed)
   Assessment: 1 Day Post-Op  S/P Procedure(s) (LRB): LEFT HIP   NAIL (Left) by Dr. Ernesta Amble. Murphy on 11/29/15  Principal Problem:   Closed left hip fracture [facility]) Active Problems:   Hypokalemia   Hip fracture (HCC)   Dysuria   Fall   RLS (restless legs syndrome)  Plan: Advance diet Up with therapy Likely Discharge to SNF tomorrow.  Requests [facility]  Weight Bearing: Weight Bearing as Tolerated (WBAT) left leg Dressings: PRN.  VTE prophylaxis: Lovenox, SCDs, ambulation.  Recommend continuing Lovenox x 30 days post op. Dispo: Skilled Nursing Facility/Rehab   Follow up with Dr. Alain Marion in the office in 2 weeks.  Subjective: Patient reports pain as moderate. Pain controlled okay with IV and PO meds.  Tolerating diet.  Urinating.  +Flatus.  No CP, SOB.  Not yet OOB.  Objective:   VITALS:   Vitals:   11/29/15 1959 11/30/15 0132 11/30/15 0340 11/30/15 0423  BP: 130/62  136/77   Pulse: (!) 114 (!) 103 (!) 105   Resp: 20  20   Temp: 97.5 F (36.4 C)  98.2 F (36.8 C)   TempSrc: Oral  Oral   SpO2: 96%  96%   Weight:    100.7 kg (222 lb)  Height:       CBC Latest Ref Rng & Units 11/28/2015 11/30/2014 11/30/2014  WBC 4.0 - 10.5 K/uL 13.1(H) 11.5(H) 8.7  Hemoglobin 12.0 - 15.0 g/dL 14.8 16.5(H) 16.4(H)  Hematocrit 36.0 - 46.0 % 42.6 46.1(H) 46.4(H)  Platelets 150 - 400 K/uL 159 163 168   Intake/Output      09/14 0701 - 09/15 0700 09/15 0701 - 09/16 0700   P.O. 480    I.V. (mL/kg) 1551.8 (15.4)    IV Piggyback 250    Total Intake(mL/kg) 2281.8 (22.7)    Urine (mL/kg/hr) 575 (0.2)    Other     Blood 200 (0.1)    Total Output 775     Net +1506.8          Urine Occurrence 3 x      General: NAD.  Eating on arrival. Resp: No increased wob ABD protuberant, soft MSK Foot warm  Neurovascularly intact Sensation intact distally Intact pulses distally Dorsiflexion/Plantar flexion intact - significant pez cavus. Incision: dressing C/D/I   Prudencio Burly  III 11/30/2015, 8:35 AM

## 2015-12-01 DIAGNOSIS — S72002D Fracture of unspecified part of neck of left femur, subsequent encounter for closed fracture with routine healing: Secondary | ICD-10-CM

## 2015-12-01 LAB — BASIC METABOLIC PANEL
ANION GAP: 9 (ref 5–15)
BUN: 6 mg/dL (ref 6–20)
CALCIUM: 8.5 mg/dL — AB (ref 8.9–10.3)
CHLORIDE: 102 mmol/L (ref 101–111)
CO2: 29 mmol/L (ref 22–32)
CREATININE: 0.71 mg/dL (ref 0.44–1.00)
GFR calc non Af Amer: >60 mL/min (ref >60)
Glucose, Bld: 123 mg/dL — ABNORMAL HIGH (ref 65–99)
Potassium: 3.8 mmol/L (ref 3.5–5.1)
SODIUM: 140 mmol/L (ref 135–145)

## 2015-12-01 LAB — GLUCOSE, CAPILLARY
GLUCOSE-CAPILLARY: 126 mg/dL — AB (ref 65–99)
Glucose-Capillary: 157 mg/dL — ABNORMAL HIGH (ref 65–99)

## 2015-12-01 MED ORDER — POLYETHYLENE GLYCOL 3350 17 G PO PACK: 17.0000 g | PACK | Freq: Every day | ORAL | 0 refills | Status: DC

## 2015-12-01 MED ORDER — HYDROCODONE-ACETAMINOPHEN 5-325 MG PO TABS: 1.0000 | ORAL_TABLET | Freq: Four times a day (QID) | ORAL | 0 refills | Status: DC | PRN

## 2015-12-01 MED ORDER — ENOXAPARIN SODIUM 40 MG/0.4ML ~~LOC~~ SOLN: 40.0000 mg | SUBCUTANEOUS | Status: DC

## 2015-12-01 MED ORDER — CEFUROXIME AXETIL 500 MG PO TABS: 500.0000 mg | ORAL_TABLET | Freq: Two times a day (BID) | ORAL | 0 refills | Status: DC

## 2015-12-01 NOTE — Progress Notes (Signed)
SPORTS MEDICINE AND JOINT REPLACEMENT  Lara Mulch, MD    Carlyon Shadow, PA-C [place], [place], [place]  [address]                             [phone]   PROGRESS NOTE  Subjective:  negative for Chest Pain  negative for Shortness of Breath  negative for Nausea/Vomiting   negative for Calf Pain  negative for Bowel Movement   Tolerating Diet: yes         Patient reports pain as 4 on 0-10 scale.    Objective: Vital signs in last 24 hours:   Patient Vitals for the past 24 hrs:  BP Temp Temp src Pulse Resp SpO2  12/01/15 0500 - - - - - 97 %  12/01/15 0457 (!) 141/65 99 F (37.2 C) Oral (!) 106 18 (!) 89 %  12/01/15 0009 131/73 98.2 F (36.8 C) Oral (!) 101 18 93 %  11/30/15 2025 138/69 99.3 F (37.4 C) Oral (!) 114 17 95 %  11/30/15 0928 114/74 - - (!) 102 - -    @flow {1959:LAST@   Intake/Output from previous day:   09/15 0701 - 09/16 0700 In: 240 [P.O.:240] Out: 550 [Urine:550]   Intake/Output this shift:   No intake/output data recorded.   Intake/Output      09/15 0701 - 09/16 0700 09/16 0701 - 09/17 0700   P.O. 240    I.V. (mL/kg)     IV Piggyback     Total Intake(mL/kg) 240 (2.4)    Urine (mL/kg/hr) 550 (0.2)    Stool 0 (0)    Blood     Total Output 550     Net -310          Urine Occurrence 3 x    Stool Occurrence 1 x       LABORATORY DATA:  Recent Labs  11/28/15 0146  WBC 13.1*  HGB 14.8  HCT 42.6  PLT 159    Recent Labs  11/28/15 0146 11/28/15 1334 11/29/15 0744 11/30/15 0901 12/01/15 0309  NA 137 138 141 142 140  K 2.6* 3.1* 3.2* 3.7 3.8  CL 99* 104 104 104 102  CO2 26 27 31  32 29  BUN 11 11 6  <5* 6  CREATININE 0.84 0.86 0.73 0.73 0.71  GLUCOSE 179* 150* 117* 120* 123*  CALCIUM 8.4* 8.2* 8.0* 8.1* 8.5*   Lab Results  Component Value Date   INR 1.16 11/28/2015   INR 1.08 11/30/2014   INR 1.24 01/18/2014    Examination:  General appearance: alert, cooperative and no distress Extremities: extremities  normal, atraumatic, no cyanosis or edema  Wound Exam: clean, dry, intact   Drainage:  None: wound tissue dry  Motor Exam: Quadriceps and Hamstrings Intact  Sensory Exam: Superficial Peroneal, Deep Peroneal and Tibial normal   Assessment:    2 Days Post-Op  Procedure(s) (LRB): LEFT HIP   NAIL (Left)  ADDITIONAL DIAGNOSIS:  Principal Problem:   Closed left hip fracture (HCC) Active Problems:   Hypokalemia   Hip fracture (HCC)   Dysuria   Fall   RLS (restless legs syndrome)  Acute Blood Loss Anemia   Plan: Physical Therapy as ordered Weight Bearing as Tolerated (WBAT)  DVT Prophylaxis:  Lovenox  DISCHARGE PLAN: Skilled Nursing Facility/Rehab  DISCHARGE NEEDS: HHPT   Patient doing well. Will D/C to SNF today or tomorrow as long as a bed is available  Donia Ast 12/01/2015, 9:11 AM

## 2015-12-01 NOTE — NC FL2 (Signed)
[place] LEVEL OF CARE SCREENING TOOL     IDENTIFICATION  Patient Name: Denise Kelly Birthdate: 1940/12/26 Sex: female Admission Date (Current Location): 11/28/2015  [facility] and [place] Number:  Herbalist and Address:  [facility], [place] [address] [place], [place] [address]      Provider Number: [id number]  Attending Physician Name and Address:  Verlee Monte, MD  Relative Name and Phone Number:       Current Level of Care: Hospital Recommended Level of Care: [place] Prior Approval Number:    Date Approved/Denied:   PASRR Number:   [id number]   Discharge Plan: SNF    Current Diagnoses: Patient Active Problem List   Diagnosis Date Noted  . Hip fracture ([place]) 11/28/2015  . Closed left hip fracture ([place]) 11/28/2015  . Dysuria 11/28/2015  . Fall 11/28/2015  . RLS (restless legs syndrome) 11/28/2015  . Altered mental state 11/30/2014  . Hypokalemia 03/14/2014  . Acute encephalopathy 03/14/2014  . UTI (lower urinary tract infection) 03/14/2014  . Diarrhea 03/14/2014  . Effusion of right knee 02/22/2014  . Alcohol dependence ([place]) 02/22/2014  . Substance induced mood disorder ([place]) 02/22/2014  . Hepatic encephalopathy ([place])   . Altered mental status 02/21/2014  . SIRS (systemic inflammatory response syndrome) ([place]) 02/21/2014  . Complicated UTI (urinary tract infection)   . E coli bacteremia   . Sepsis due to Escherichia coli ([place])   . Screen for STD (sexually transmitted disease)   . S/P total knee arthroplasty 01/02/2014  . Right knee pain 11/10/2013  . Essential hypertension, benign 11/10/2013  . LAFB (left anterior fascicular block) 11/10/2013  . Obesity, unspecified 11/10/2013    Orientation RESPIRATION BLADDER Height & Weight     Self, Situation  Normal Continent Weight: 222 lb (100.7 kg) (bed scale; reweighed twice) Height:  5\' 6"  (167.6 cm)  BEHAVIORAL SYMPTOMS/MOOD NEUROLOGICAL  BOWEL NUTRITION STATUS      Continent  (nORMAL)  AMBULATORY STATUS COMMUNICATION OF NEEDS Skin     Verbally Normal                       Personal Care Assistance Level of Assistance  Bathing, Dressing Bathing Assistance: Maximum assistance   Dressing Assistance: Maximum assistance     Functional Limitations Info             SPECIAL CARE FACTORS FREQUENCY                       Contractures      Additional Factors Info                  Current Medications (12/01/2015):  This is the current hospital active medication list Current Facility-Administered Medications  Medication Dose Route Frequency Provider Last Rate Last Dose  . 0.9 %  sodium chloride infusion   Intravenous Continuous Verlee Monte, MD 10 mL/hr at 11/29/15 1414    . acetaminophen (TYLENOL) tablet 1,000 mg  1,000 mg Oral Daily PRN Norval Morton, MD   1,000 mg at 11/28/15 1028  . aspirin chewable tablet 81 mg  81 mg Oral Daily Norval Morton, MD   81 mg at 12/01/15 0929  . cefTRIAXone (ROCEPHIN) 1 g in dextrose 5 % 50 mL IVPB  1 g Intravenous Q24H Norval Morton, MD   1 g at 12/01/15 0506  . cholecalciferol (VITAMIN D) tablet 1,000 Units  1,000  Units Oral Daily Norval Morton, MD   1,000 Units at 12/01/15 [phone]  . citalopram (CELEXA) tablet 20 mg  20 mg Oral QHS Ritta Slot, NP   20 mg at 11/30/15 2235  . enoxaparin (LOVENOX) injection 40 mg  40 mg Subcutaneous Q24H Norval Morton, MD   40 mg at 11/30/15 1658  . HYDROcodone-acetaminophen (NORCO/VICODIN) 5-325 MG per tablet 1-2 tablet  1-2 tablet Oral Q6H PRN Norval Morton, MD   2 tablet at 12/01/15 0506  . hydrOXYzine (ATARAX/VISTARIL) tablet 50 mg  50 mg Oral QHS PRN Norval Morton, MD   50 mg at 11/30/15 2128  . loratadine (CLARITIN) tablet 10 mg  10 mg Oral Daily PRN Norval Morton, MD      . morphine 2 MG/ML injection 0.5 mg  0.5 mg Intravenous Q2H PRN Norval Morton, MD   0.5 mg at 11/30/15 1525  . ondansetron (ZOFRAN) injection 4 mg   4 mg Intravenous Q6H PRN Verlee Monte, MD   4 mg at 11/30/15 2008  . polyethylene glycol (MIRALAX / GLYCOLAX) packet 17 g  17 g Oral Daily Verlee Monte, MD   17 g at 12/01/15 1000  . pramipexole (MIRAPEX) tablet 0.375 mg  0.375 mg Oral BID PRN Norval Morton, MD      . spironolactone (ALDACTONE) tablet 25 mg  25 mg Oral Daily Norval Morton, MD   25 mg at 12/01/15 0929  . traZODone (DESYREL) tablet 100 mg  100 mg Oral QHS Norval Morton, MD   100 mg at 11/30/15 2129     Discharge Medications: Please see discharge summary for a list of discharge medications.  Relevant Imaging Results:  Relevant Lab Results:   Additional Information  (SS: [id number])  Junie Spencer, LCSW

## 2015-12-01 NOTE — Progress Notes (Signed)
[name] to be D/C'd Skilled nursing facility per MD order.  Discussed with the patient and all questions fully answered.  VSS, Skin clean, dry and intact without evidence of skin break down, no evidence of skin tears noted. IV catheter discontinued intact. Site without signs and symptoms of complications. Dressing and pressure applied.  An After Visit Summary was printed and given to the patient. Patient received prescription.  D/c education completed with patient/family including follow up instructions, medication list, d/c activities limitations if indicated, with other d/c instructions as indicated by MD - patient able to verbalize understanding, all questions fully answered.   Patient instructed to return to ED, call 911, or call MD for any changes in condition.   Patient discharge to SNF via [name] 12/01/2015 2:24 PM

## 2015-12-01 NOTE — Discharge Summary (Signed)
Physician Discharge Summary  Denise Kelly [id number] DOB: 1940/04/12 DOA: 11/28/2015  PCP: Jonathon Bellows, MD  Admit date: 11/28/2015 Discharge date: 12/01/2015  Admitted From: Home Disposition: SNF  Recommendations for Outpatient Follow-up:  1. Follow up with PCP in 1-2 weeks 2. Please obtain BMP/CBC in one week. 3. Ceftin for 5 more days. 4. Lovenox 40 mg subcutaneously for 21 more days for postoperative prophylaxis after hip surgery  Home Health: N/A Equipment/Devices: N/A  Discharge Condition: Stable CODE STATUS: Full code Diet recommendation: Heart Healthy  Brief/Interim Summary: Denise Kelly is a 75 y.o. female with medical history significant of HTN, hypokalemia, UTIs; who presents after having a fall at home. Patient notes that she had gotten up to use the bathroom last night and tripped on the rug and fell on her left leg. Patient is unsure if hit her head or lost consciousness. She reports having sharp pain in the left leg medially after the fall. She is not able to get to a phone and yell for help until her neighbors heard her. Pain was worsened by movement. Denies any recent changes in medications, previous fractures, lightheadedness, vomiting, palpitations, chest pain, abdominal pain, or diarrhea. Patient states that she is not had an alcoholic drink in over 10 years, tobacco, or illicit drug use. Patient notes that she has been short of breath for multiple weeks now and was recently referred to pulmonology, and is currently being set up to have PFT studies. Other associated symptoms include complaints of chronic nausea and dysuria.  Discharge Diagnoses:  Principal Problem:   Closed left hip fracture (HCC) Active Problems:   Hypokalemia   Hip fracture (HCC)   Dysuria   Fall   RLS (restless legs syndrome)   Left Hip fracture:  -Acute. Patient with a comminuted displaced left intertrochanteric hip fracture -Hip fracture protocol initiated -Status post ORIF with  [employer] gamma nail done by Dr. Percell Miller done on 11/29/2015. -Patient is controlled with opioids. -DVT prophylaxis with Lovenox.  Fall -Patient reported that she tripped on one of her cats, denies any prodromal symptoms prior to the fall. -Denies any loss of consciousness, denies any chest pain, shortness of breath or palpitations. -Patient is on telemetry, continue to monitor for 1 day but no further workup for now.  UTI -Patient has dysuria and history of frequent UTIs. Urinalysis consistent with UTI. -Started on Rocephin, culture was not back at the time of discharge, Ceftin for 5 more days.  Sinus tachycardia: Acute.  -This is resolved.  Hypokalemia: -Potassium is 2.6, patient received 30 mEq of IV potassium and 40 mEq of oral potassium. -Repleted with oral supplements, resolved.  Leukocytosis:WBC elevated at 13.1 on admission. Patient reports some dysuria. - follow-up UA, if positive will check urine culture and treat with antibiotics of ceftriaxone  Hyperglycemia: Previous hemoglobin A1c was noted to be 5.5 in 11/2014.   Chronic nausea - Zofran prn   RLS - continue Mirapex  Alcohol abuse history:  -Patient denies any alcohol use. -Slight elevation of ammonia at 50, does not appear to be confused or in acute hepatic encephalopathy.  Discharge Instructions  Discharge Instructions    Diet - low sodium heart healthy    Complete by:  As directed    Increase activity slowly    Complete by:  As directed        Medication List    TAKE these medications   acetaminophen 500 MG tablet Commonly known as:  TYLENOL Take 1,000 mg by mouth  daily as needed for mild pain.   aspirin 81 MG tablet Take 81 mg by mouth daily.   CALCIUM PO Take 1 tablet by mouth daily.   cefUROXime 500 MG tablet Commonly known as:  CEFTIN Take 1 tablet (500 mg total) by mouth 2 (two) times daily with a meal.   cholecalciferol 1000 units tablet Commonly known as:  VITAMIN D Take  1,000 Units by mouth daily.   citalopram 20 MG tablet Commonly known as:  CELEXA Take 1 tablet (20 mg total) by mouth at bedtime.   CLARITIN 10 MG tablet Generic drug:  loratadine Take 10 mg by mouth daily as needed for allergies or itching.   enoxaparin 40 MG/0.4ML injection Commonly known as:  LOVENOX Inject 0.4 mLs (40 mg total) into the skin daily.   feeding supplement (ENSURE COMPLETE) Liqd Take 237 mLs by mouth 2 (two) times daily between meals.   HYDROcodone-acetaminophen 5-325 MG tablet Commonly known as:  NORCO/VICODIN Take 1-2 tablets by mouth every 6 (six) hours as needed for moderate pain.   hydrOXYzine 25 MG tablet Commonly known as:  ATARAX/VISTARIL Take 50 mg by mouth at bedtime as needed for anxiety (sleep).   LUTEIN PO Take 1 tablet by mouth daily.   omega-3 acid ethyl esters 1 g capsule Commonly known as:  LOVAZA Take 1 g by mouth daily.   ondansetron 4 MG disintegrating tablet Commonly known as:  ZOFRAN-ODT Take 4 mg by mouth every 6 (six) hours as needed for nausea.   polyethylene glycol packet Commonly known as:  MIRALAX / GLYCOLAX Take 17 g by mouth daily.   pramipexole 0.125 MG tablet Commonly known as:  MIRAPEX Take 0.375 mg by mouth 2 (two) times daily as needed (restless leg).   PROBIOTIC ADVANCED PO Take 1 tablet by mouth daily.   spironolactone 25 MG tablet Commonly known as:  ALDACTONE Take 25 mg by mouth daily.   traZODone 50 MG tablet Commonly known as:  DESYREL Take 1 tablet (50 mg total) by mouth at bedtime. What changed:  how much to take      Follow-up Information    MURPHY, TIMOTHY D, MD Follow up in 2 week(s).   Specialty:  Orthopedic Surgery Contact information: [address] [place] [address] [phone]          Allergies  Allergen Reactions  . Sulfa Antibiotics Hives and Swelling  . Ciprofloxacin Itching  . Motrin [Ibuprofen] Palpitations     Consultations:  Orthopedics   Procedures/Studies: Dg Chest Port 1 View  Result Date: 11/28/2015 CLINICAL DATA:  Dyspnea.  Left hip fracture. EXAM: PORTABLE CHEST 1 VIEW COMPARISON:  11/30/2014 FINDINGS: There is unchanged mild right hemidiaphragm elevation. There is unchanged moderate cardiomegaly. The lungs are clear. There is no large effusion. There is no pneumothorax. The pulmonary vasculature is normal. IMPRESSION: Unchanged cardiomegaly.  No acute cardiopulmonary findings. Electronically Signed   By: Andreas Newport M.D.   On: 11/28/2015 04:04   Dg C-arm 1-60 Min  Result Date: 11/29/2015 CLINICAL DATA:  ORIF of a left intertrochanteric proximal femur fracture. EXAM: LEFT FEMUR 2 VIEWS; DG C-ARM 61-120 MIN COMPARISON:  11/28/2015 FINDINGS: Submitted portable fluoroscopic images show placement of an intra medullary rod, which extends to the distal femur, supporting a compression screw. This reduces the major fracture components into near anatomic alignment. No new fracture or evidence of an operative complication. IMPRESSION: Well aligned major fracture components following ORIF of the left proximal femur intertrochanteric fracture. Electronically  Signed   By: Lajean Manes M.D.   On: 11/29/2015 10:47   Dg Hip Unilat With Pelvis 2-3 Views Left  Result Date: 11/28/2015 CLINICAL DATA:  Fall while walking to the bathroom onto left hip, deformity and pain. EXAM: DG HIP (WITH OR WITHOUT PELVIS) 2-3V LEFT COMPARISON:  None. FINDINGS: Comminuted displaced left hip intertrochanteric fracture with mild angulation and shortening of the femoral shaft. Lesser trochanteric component with at least 2 cm osseous distraction. No additional acute fracture of the bony pelvis. The bones are under mineralized. IMPRESSION: Comminuted displaced left intertrochanteric hip fracture. Electronically Signed   By: Jeb Levering M.D.   On: 11/28/2015 02:20   Dg Femur Min 2 Views Left  Result Date:  11/29/2015 CLINICAL DATA:  ORIF of a left intertrochanteric proximal femur fracture. EXAM: LEFT FEMUR 2 VIEWS; DG C-ARM 61-120 MIN COMPARISON:  11/28/2015 FINDINGS: Submitted portable fluoroscopic images show placement of an intra medullary rod, which extends to the distal femur, supporting a compression screw. This reduces the major fracture components into near anatomic alignment. No new fracture or evidence of an operative complication. IMPRESSION: Well aligned major fracture components following ORIF of the left proximal femur intertrochanteric fracture. Electronically Signed   By: Lajean Manes M.D.   On: 11/29/2015 10:47    (Echo, Carotid, EGD, Colonoscopy, ERCP)    Subjective:   Discharge Exam: Vitals:   12/01/15 0009 12/01/15 0457  BP: 131/73 (!) 141/65  Pulse: (!) 101 (!) 106  Resp: 18 18  Temp: 98.2 F (36.8 C) 99 F (37.2 C)   Vitals:   11/30/15 2025 12/01/15 0009 12/01/15 0457 12/01/15 0500  BP: 138/69 131/73 (!) 141/65   Pulse: (!) 114 (!) 101 (!) 106   Resp: 17 18 18    Temp: 99.3 F (37.4 C) 98.2 F (36.8 C) 99 F (37.2 C)   TempSrc: Oral Oral Oral   SpO2: 95% 93% (!) 89% 97%  Weight:      Height:        General: Pt is alert, awake, not in acute distress Cardiovascular: RRR, S1/S2 +, no rubs, no gallops Respiratory: CTA bilaterally, no wheezing, no rhonchi Abdominal: Soft, NT, ND, bowel sounds + Extremities: no edema, no cyanosis    The results of significant diagnostics from this hospitalization (including imaging, microbiology, ancillary and laboratory) are listed below for reference.     Microbiology: Recent Results (from the past 240 hour(s))  Surgical pcr screen     Status: None   Collection Time: 11/28/15  5:30 AM  Result Value Ref Range Status   MRSA, PCR NEGATIVE NEGATIVE Final   Staphylococcus aureus NEGATIVE NEGATIVE Final    Comment:        The Xpert SA Assay (FDA approved for NASAL specimens in patients over 65 years of age), is one  component of a comprehensive surveillance program.  Test performance has been validated by [facility] for patients greater than or equal to 71 year old. It is not intended to diagnose infection nor to guide or monitor treatment.      Labs: BNP (last 3 results) No results for input(s): BNP in the last 8760 hours. Basic Metabolic Panel:  Recent Labs Lab 11/28/15 0146 11/28/15 1334 11/29/15 0538 11/29/15 0744 11/30/15 0901 12/01/15 0309  NA 137 138  --  141 142 140  K 2.6* 3.1*  --  3.2* 3.7 3.8  CL 99* 104  --  104 104 102  CO2 26 27  --  31 32  29  GLUCOSE 179* 150*  --  117* 120* 123*  BUN 11 11  --  6 <5* 6  CREATININE 0.84 0.86  --  0.73 0.73 0.71  CALCIUM 8.4* 8.2*  --  8.0* 8.1* 8.5*  MG  --   --  1.9  --   --   --    Liver Function Tests: No results for input(s): AST, ALT, ALKPHOS, BILITOT, PROT, ALBUMIN in the last 168 hours. No results for input(s): LIPASE, AMYLASE in the last 168 hours.  Recent Labs Lab 11/29/15 0538  AMMONIA 50*   CBC:  Recent Labs Lab 11/28/15 0146  WBC 13.1*  NEUTROABS 10.8*  HGB 14.8  HCT 42.6  MCV 91.0  PLT 159   Cardiac Enzymes: No results for input(s): CKTOTAL, CKMB, CKMBINDEX, TROPONINI in the last 168 hours. BNP: Invalid input(s): POCBNP CBG:  Recent Labs Lab 12/01/15 0621  GLUCAP 157*   D-Dimer No results for input(s): DDIMER in the last 72 hours. Hgb A1c No results for input(s): HGBA1C in the last 72 hours. Lipid Profile No results for input(s): CHOL, HDL, LDLCALC, TRIG, CHOLHDL, LDLDIRECT in the last 72 hours. Thyroid function studies No results for input(s): TSH, T4TOTAL, T3FREE, THYROIDAB in the last 72 hours.  Invalid input(s): FREET3 Anemia work up No results for input(s): VITAMINB12, FOLATE, FERRITIN, TIBC, IRON, RETICCTPCT in the last 72 hours. Urinalysis    Component Value Date/Time   COLORURINE AMBER (A) 11/28/2015 0349   APPEARANCEUR CLOUDY (A) 11/28/2015 0349   LABSPEC 1.019 11/28/2015  0349   PHURINE 5.5 11/28/2015 0349   GLUCOSEU NEGATIVE 11/28/2015 0349   HGBUR LARGE (A) 11/28/2015 0349   BILIRUBINUR SMALL (A) 11/28/2015 0349   KETONESUR 15 (A) 11/28/2015 0349   PROTEINUR 30 (A) 11/28/2015 0349   UROBILINOGEN 1.0 11/30/2014 2105   NITRITE POSITIVE (A) 11/28/2015 0349   LEUKOCYTESUR MODERATE (A) 11/28/2015 0349   Sepsis Labs Invalid input(s): PROCALCITONIN,  WBC,  LACTICIDVEN Microbiology Recent Results (from the past 240 hour(s))  Surgical pcr screen     Status: None   Collection Time: 11/28/15  5:30 AM  Result Value Ref Range Status   MRSA, PCR NEGATIVE NEGATIVE Final   Staphylococcus aureus NEGATIVE NEGATIVE Final    Comment:        The Xpert SA Assay (FDA approved for NASAL specimens in patients over 83 years of age), is one component of a comprehensive surveillance program.  Test performance has been validated by [facility] for patients greater than or equal to 45 year old. It is not intended to diagnose infection nor to guide or monitor treatment.      Time coordinating discharge: Over 30 minutes  SIGNED:   Birdie Hopes, MD  [facility] 12/01/2015, 10:28 AM Pager   If 7PM-7AM, please contact night-coverage [email or web address] Password [email or web address]

## 2015-12-03 ENCOUNTER — Encounter (HOSPITAL_COMMUNITY): Payer: Self-pay | Admitting: Orthopedic Surgery

## 2016-01-22 ENCOUNTER — Inpatient Hospital Stay (HOSPITAL_COMMUNITY)
Admission: EM | Admit: 2016-01-22 | Discharge: 2016-01-26 | DRG: 092 | Disposition: A | Discharge status: Skilled Nursing Facility | Payer: Medicare Other | Attending: Internal Medicine | Admitting: Internal Medicine

## 2016-01-22 ENCOUNTER — Emergency Department (HOSPITAL_COMMUNITY): Payer: Medicare Other

## 2016-01-22 DIAGNOSIS — M479 Spondylosis, unspecified: Secondary | ICD-10-CM | POA: Diagnosis present

## 2016-01-22 DIAGNOSIS — G92 Toxic encephalopathy: Secondary | ICD-10-CM | POA: Diagnosis not present

## 2016-01-22 DIAGNOSIS — I10 Essential (primary) hypertension: Secondary | ICD-10-CM | POA: Diagnosis present

## 2016-01-22 DIAGNOSIS — N3941 Urge incontinence: Secondary | ICD-10-CM | POA: Diagnosis present

## 2016-01-22 DIAGNOSIS — Z96653 Presence of artificial knee joint, bilateral: Secondary | ICD-10-CM | POA: Diagnosis present

## 2016-01-22 DIAGNOSIS — N390 Urinary tract infection, site not specified: Secondary | ICD-10-CM | POA: Diagnosis present

## 2016-01-22 DIAGNOSIS — G9340 Encephalopathy, unspecified: Secondary | ICD-10-CM | POA: Diagnosis present

## 2016-01-22 DIAGNOSIS — Z7982 Long term (current) use of aspirin: Secondary | ICD-10-CM

## 2016-01-22 DIAGNOSIS — M549 Dorsalgia, unspecified: Secondary | ICD-10-CM

## 2016-01-22 DIAGNOSIS — Z87891 Personal history of nicotine dependence: Secondary | ICD-10-CM

## 2016-01-22 DIAGNOSIS — B9620 Unspecified Escherichia coli [E. coli] as the cause of diseases classified elsewhere: Secondary | ICD-10-CM | POA: Diagnosis present

## 2016-01-22 DIAGNOSIS — E876 Hypokalemia: Secondary | ICD-10-CM | POA: Diagnosis present

## 2016-01-22 DIAGNOSIS — G2581 Restless legs syndrome: Secondary | ICD-10-CM | POA: Diagnosis present

## 2016-01-22 DIAGNOSIS — T426X5A Adverse effect of other antiepileptic and sedative-hypnotic drugs, initial encounter: Secondary | ICD-10-CM | POA: Diagnosis present

## 2016-01-22 DIAGNOSIS — R410 Disorientation, unspecified: Secondary | ICD-10-CM | POA: Diagnosis present

## 2016-01-22 DIAGNOSIS — K219 Gastro-esophageal reflux disease without esophagitis: Secondary | ICD-10-CM | POA: Diagnosis present

## 2016-01-22 DIAGNOSIS — L308 Other specified dermatitis: Secondary | ICD-10-CM | POA: Diagnosis present

## 2016-01-22 DIAGNOSIS — Z8249 Family history of ischemic heart disease and other diseases of the circulatory system: Secondary | ICD-10-CM

## 2016-01-22 DIAGNOSIS — T43215A Adverse effect of selective serotonin and norepinephrine reuptake inhibitors, initial encounter: Secondary | ICD-10-CM | POA: Diagnosis present

## 2016-01-22 LAB — URINALYSIS, ROUTINE W REFLEX MICROSCOPIC
Bilirubin Urine: NEGATIVE
GLUCOSE, UA: NEGATIVE mg/dL
Ketones, ur: NEGATIVE mg/dL
Nitrite: NEGATIVE
PROTEIN: NEGATIVE mg/dL
Specific Gravity, Urine: 1.01 (ref 1.005–1.030)
pH: 7.5 (ref 5.0–8.0)

## 2016-01-22 LAB — CBC WITH DIFFERENTIAL/PLATELET
Basophils Absolute: 0 10*3/uL (ref 0.0–0.1)
Basophils Relative: 0 %
EOS ABS: 0.2 10*3/uL (ref 0.0–0.7)
Eosinophils Relative: 2 %
HCT: 44.7 % (ref 36.0–46.0)
HEMOGLOBIN: 15.1 g/dL — AB (ref 12.0–15.0)
LYMPHS ABS: 3.1 10*3/uL (ref 0.7–4.0)
LYMPHS PCT: 32 %
MCH: 31 pg (ref 26.0–34.0)
MCHC: 33.8 g/dL (ref 30.0–36.0)
MCV: 91.8 fL (ref 78.0–100.0)
Monocytes Absolute: 0.8 10*3/uL (ref 0.1–1.0)
Monocytes Relative: 9 %
NEUTROS ABS: 5.5 10*3/uL (ref 1.7–7.7)
NEUTROS PCT: 57 %
Platelets: 146 10*3/uL — ABNORMAL LOW (ref 150–400)
RBC: 4.87 MIL/uL (ref 3.87–5.11)
RDW: 12.8 % (ref 11.5–15.5)
WBC: 9.8 10*3/uL (ref 4.0–10.5)

## 2016-01-22 LAB — URINE MICROSCOPIC-ADD ON

## 2016-01-22 LAB — COMPREHENSIVE METABOLIC PANEL
ALT: 10 U/L — ABNORMAL LOW (ref 14–54)
ANION GAP: 9 (ref 5–15)
AST: 18 U/L (ref 15–41)
Albumin: 3.5 g/dL (ref 3.5–5.0)
Alkaline Phosphatase: 115 U/L (ref 38–126)
BUN: 6 mg/dL (ref 6–20)
CHLORIDE: 98 mmol/L — AB (ref 101–111)
CO2: 32 mmol/L (ref 22–32)
Calcium: 8.4 mg/dL — ABNORMAL LOW (ref 8.9–10.3)
Creatinine, Ser: 0.82 mg/dL (ref 0.44–1.00)
GFR calc non Af Amer: >60 mL/min (ref >60)
Glucose, Bld: 95 mg/dL (ref 65–99)
POTASSIUM: 3 mmol/L — AB (ref 3.5–5.1)
SODIUM: 139 mmol/L (ref 135–145)
Total Bilirubin: 0.8 mg/dL (ref 0.3–1.2)
Total Protein: 6.5 g/dL (ref 6.5–8.1)

## 2016-01-22 LAB — CBG MONITORING, ED: Glucose-Capillary: 87 mg/dL (ref 65–99)

## 2016-01-22 LAB — AMMONIA: AMMONIA: 14 umol/L (ref 9–35)

## 2016-01-22 MED ORDER — DEXTROSE 5 % IV SOLN
1.0000 g | Freq: Once | INTRAVENOUS | Status: AC
  Administered 2016-01-23: 1 g via INTRAVENOUS
  Filled 2016-01-22: qty 10

## 2016-01-22 MED ORDER — POTASSIUM CHLORIDE CRYS ER 20 MEQ PO TBCR
40.0000 meq | EXTENDED_RELEASE_TABLET | Freq: Once | ORAL | Status: AC
  Administered 2016-01-23: 40 meq via ORAL
  Filled 2016-01-22: qty 2

## 2016-01-22 MED ORDER — OXYCODONE-ACETAMINOPHEN 5-325 MG PO TABS
1.0000 | ORAL_TABLET | Freq: Once | ORAL | Status: AC
  Administered 2016-01-23: 1 via ORAL
  Filled 2016-01-22: qty 1

## 2016-01-22 NOTE — ED Triage Notes (Signed)
Pt BIB [facility]. Pt went to rehab after falling in September and left early out of the rehab. Today pt is frequently urinating and confused. L sided hip pain, no new hip injury. Alert. Pt is also here to be evaluated for failure to thrive.

## 2016-01-22 NOTE — ED Notes (Signed)
Patient transported to X-ray 

## 2016-01-22 NOTE — ED Notes (Signed)
Per MD order I&O U/A. Raquel Sarna RN advised pt unable to stand/walk, incontinent. Pt brought to ED soaked in urine; states recently urinated/bladder empty. Pt dried cleaned and changed and provided diaper w/NA Tech assistance prior to MD order...will recheck in 1-2hrs approx to collect U/A, clean/change PRN. Raquel Sarna RN also advised of skin rash around perineal/hip area, very red moist and irritated; will apply barrier cream/ointment when collecting U/A. ENM

## 2016-01-22 NOTE — ED Notes (Signed)
Bed: WA09 Expected date:  Expected time:  Means of arrival:  Comments: Hold for hall B 

## 2016-01-22 NOTE — ED Provider Notes (Signed)
[facility] DEPT Provider Note   CSN: [id number] Arrival date & time: 01/22/16  2026     History   Chief Complaint Chief Complaint  Patient presents with  . Altered Mental Status    HPI Denise Kelly is a 75 y.o. female.  HPI  71 -year-old female presents with altered mental status. History taken from patient and daughter. Patient had her left hip replaced after a fracture about 2 months ago. Has been having continued left leg and hip pain since. Her orthopedist thinks it might be spinal related. Patient has no back pain at this time. Over the last few days the daughter states she's been acting confused. She will say confusing things such as when is the dog going to go to the vet, even though she does not have dogs. She also has been sending confusing text messages. Has been starting to have difficulty getting to the bathroom both due to pain and incontinence. However despite having pain for 2 months she has always been aware get to the bathroom before the last few days. No fevers. No cough or shortness of breath. No headaches or falls. Has been on tramadol without any relief of her pain.  Past Medical History:  Diagnosis Date  . Alcohol abuse    ETOH and xanax  . Anxiety   . Arthritis   . Cancer ([place])   . Complication of anesthesia    "felt drunk for a week after"  . Encephalopathy   . GERD (gastroesophageal reflux disease)   . Hypertension    not being treated with medication  . Palpitations     Patient Active Problem List   Diagnosis Date Noted  . UTI (urinary tract infection) 01/22/2016  . Hip fracture ([place]) 11/28/2015  . Closed left hip fracture ([place]) 11/28/2015  . Dysuria 11/28/2015  . Fall 11/28/2015  . RLS (restless legs syndrome) 11/28/2015  . Altered mental state 11/30/2014  . Hypokalemia 03/14/2014  . Acute encephalopathy 03/14/2014  . UTI (lower urinary tract infection) 03/14/2014  . Diarrhea 03/14/2014  . Effusion of right knee 02/22/2014  .  Alcohol dependence ([place]) 02/22/2014  . Substance induced mood disorder ([place]) 02/22/2014  . Hepatic encephalopathy ([employer])   . Altered mental status 02/21/2014  . SIRS (systemic inflammatory response syndrome) ([place]) 02/21/2014  . Complicated UTI (urinary tract infection)   . E coli bacteremia   . Sepsis due to Escherichia coli ([place])   . Screen for STD (sexually transmitted disease)   . S/P total knee arthroplasty 01/02/2014  . Right knee pain 11/10/2013  . Essential hypertension, benign 11/10/2013  . LAFB (left anterior fascicular block) 11/10/2013  . Obesity, unspecified 11/10/2013    Past Surgical History:  Procedure Laterality Date  . BREAST SURGERY     2 breast biopsies  . carotid cavernous fistula     to block  . COLONOSCOPY    . EYE SURGERY Bilateral    cataracts  . INTRAMEDULLARY (IM) NAIL INTERTROCHANTERIC Left 11/29/2015   Procedure: LEFT HIP   NAIL;  Surgeon: Renette Butters, MD;  Location: [facility];  Service: Orthopedics;  Laterality: Left;  . JOINT REPLACEMENT Left 09/2009   knee  . JOINT REPLACEMENT Right 01/02/2014  . TONSILLECTOMY    . TOTAL KNEE ARTHROPLASTY Right 01/02/2014   Dr Ronnie Derby  . TOTAL KNEE ARTHROPLASTY Right 01/02/2014   Procedure: TOTAL KNEE ARTHROPLASTY;  Surgeon: [name], MD;  Location: [facility];  Service: Orthopedics;  Laterality: Right;  . TUBAL LIGATION  OB History    No data available       Home Medications    Prior to Admission medications   Medication Sig Start Date End Date Taking? Authorizing Provider  acetaminophen (TYLENOL) 500 MG tablet Take 500 mg by mouth every 6 (six) hours as needed for mild pain, moderate pain or headache.    Yes Historical Provider, MD  acidophilus (RISAQUAD) CAPS capsule Take 1 capsule by mouth daily.   Yes Historical Provider, MD  aspirin EC 81 MG tablet Take 81 mg by mouth daily.   Yes Historical Provider, MD  citalopram (CELEXA) 20 MG tablet Take 1 tablet (20 mg total) by mouth at bedtime. 02/24/14   Yes [name], MD  hydrOXYzine (ATARAX/VISTARIL) 25 MG tablet Take 50 mg by mouth at bedtime as needed for anxiety (and/or sleep).    Yes Historical Provider, MD  loratadine (CLARITIN) 10 MG tablet Take 10 mg by mouth daily as needed for allergies.    Yes Historical Provider, MD  methocarbamol (ROBAXIN) 500 MG tablet Take 250 mg by mouth 2 (two) times daily as needed for muscle spasms.   Yes Historical Provider, MD  ondansetron (ZOFRAN-ODT) 4 MG disintegrating tablet Take 4 mg by mouth every 6 (six) hours as needed for nausea or vomiting.    Yes Historical Provider, MD  pramipexole (MIRAPEX) 0.125 MG tablet Take 0.375 mg by mouth 2 (two) times daily as needed (for restless leg syndrome).    Yes Historical Provider, MD  spironolactone (ALDACTONE) 25 MG tablet Take 25 mg by mouth daily.   Yes Historical Provider, MD  traMADol (ULTRAM) 50 MG tablet Take 50-100 mg by mouth every 6 (six) hours as needed for moderate pain.   Yes Historical Provider, MD  traZODone (DESYREL) 50 MG tablet Take 1 tablet (50 mg total) by mouth at bedtime. 02/24/14  Yes [name], MD  zolpidem (AMBIEN) 10 MG tablet Take 10 mg by mouth at bedtime as needed for sleep.   Yes Historical Provider, MD    Family History No family history on file.  Social History Social History  Substance Use Topics  . Smoking status: Former Research scientist (life sciences)  . Smokeless tobacco: Never Used     Comment: quit smoking many years ago   . Alcohol use No     Allergies   Sulfa antibiotics; Ciprofloxacin; and Motrin [ibuprofen]   Review of Systems Review of Systems  Constitutional: Negative for fever.  Respiratory: Negative for cough and shortness of breath.   Cardiovascular: Negative for chest pain.  Gastrointestinal: Negative for vomiting.  Genitourinary: Negative for dysuria.  Musculoskeletal: Positive for arthralgias.  Neurological: Negative for headaches.  Psychiatric/Behavioral: Positive for confusion.  All other systems reviewed and are  negative.    Physical Exam Updated Vital Signs BP 147/89 (BP Location: Left Arm)   Pulse 91   Temp 99.5 F (37.5 C) (Rectal)   Resp 17   SpO2 92%   Physical Exam  Constitutional: She is oriented to person, place, and time. She appears well-developed and well-nourished. No distress.  HENT:  Head: Normocephalic and atraumatic.  Right Ear: External ear normal.  Left Ear: External ear normal.  Nose: Nose normal.  Eyes: Right eye exhibits no discharge. Left eye exhibits no discharge.  Cardiovascular: Normal rate, regular rhythm and normal heart sounds.   Pulmonary/Chest: Effort normal and breath sounds normal.  Abdominal: Soft. She exhibits no distension. There is no tenderness.  Musculoskeletal:       Left hip: She exhibits tenderness.  She exhibits normal range of motion.       Left knee: Tenderness found.       Lumbar back: She exhibits no tenderness.       Left upper leg: She exhibits tenderness.       Left lower leg: She exhibits no tenderness.  Neurological: She is alert and oriented to person, place, and time.  Alert, oriented to self and place. Thinks it's tomorrow based on day of week and date, both of which would be correct tomorrow. Knows year and president. Equal strength in all 4 extremities. Grossly normal sensation  Skin: Skin is warm and dry. She is not diaphoretic.  Nursing note and vitals reviewed.    ED Treatments / Results  Labs (all labs ordered are listed, but only abnormal results are displayed) Labs Reviewed  COMPREHENSIVE METABOLIC PANEL - Abnormal; Notable for the following:       Result Value   Potassium 3.0 (*)    Chloride 98 (*)    Calcium 8.4 (*)    ALT 10 (*)    All other components within normal limits  CBC WITH DIFFERENTIAL/PLATELET - Abnormal; Notable for the following:    Hemoglobin 15.1 (*)    Platelets 146 (*)    All other components within normal limits  URINALYSIS, ROUTINE W REFLEX MICROSCOPIC (NOT AT [facility]) - Abnormal; Notable for  the following:    APPearance CLOUDY (*)    Hgb urine dipstick TRACE (*)    Leukocytes, UA MODERATE (*)    All other components within normal limits  URINE MICROSCOPIC-ADD ON - Abnormal; Notable for the following:    Squamous Epithelial / LPF 0-5 (*)    Bacteria, UA MANY (*)    All other components within normal limits  URINE CULTURE  AMMONIA  CBG MONITORING, ED    EKG  EKG Interpretation  Date/Time:  Tuesday January 22 2016 21:25:06 EST Ventricular Rate:  90 PR Interval:    QRS Duration: 115 QT Interval:  381 QTC Calculation: 467 R Axis:   -24 Text Interpretation:  Sinus rhythm Left atrial enlargement Incomplete RBBB and LAFB Low voltage, precordial leads no significant change since Sept 2017 Confirmed by Regenia Skeeter MD, Rashaud Ybarbo [phone]) on 01/22/2016 9:30:25 PM       Radiology Dg Chest 2 View  Result Date: 01/22/2016 CLINICAL DATA:  Hip pain, recent surgery fever EXAM: CHEST  2 VIEW COMPARISON:  11/28/2015 FINDINGS: There are low lung volumes. There is mild cardiomegaly with mild central congestion. No overt edema. Patchy atelectasis at the left base. No pneumothorax. IMPRESSION: Cardiomegaly with mild central congestion. Patchy atelectasis at the left base. Low lung volumes with no focal consolidation. Electronically Signed   By: Donavan Foil M.D.   On: 01/22/2016 22:22   Dg Hip Unilat W Or Wo Pelvis 2-3 Views Left  Result Date: 01/22/2016 CLINICAL DATA:  Unable to bear weight, increased pain EXAM: DG HIP (WITH OR WITHOUT PELVIS) 2-3V LEFT COMPARISON:  11/28/2015 FINDINGS: Interim intra medullary rod fixation of left intertrochanteric fracture. Displaced lesser trochanteric fracture fragments is similar compared to previous exam. Hardware appears intact. Femoral head demonstrates normal positioning. Pubic symphysis is intact. Bones appear osteopenic. Fracture lucencies within the trochanter remain evident and there is little bridging callus. IMPRESSION: Intra medullary fixation of  left intertrochanteric fracture with intact hardware. Displaced lesser trochanteric fracture fragment appears similar compared to the previous exam. Bones are osteopenic and fracture lines remain visible with little bridging callus evident. Electronically Signed  By: Donavan Foil M.D.   On: 01/22/2016 22:19   Dg Femur Min 2 Views Left  Result Date: 01/22/2016 CLINICAL DATA:  Left femoral fracture, hip pain EXAM: LEFT FEMUR 2 VIEWS COMPARISON:  11/29/2015 FINDINGS: Patient is status post intra medullary rodding of left trochanteric fracture. There is also a left total knee replacement. There is satisfactory alignment. There is no acute osseous abnormality involving the visualized portions of the mid and distal femur. IMPRESSION: Postsurgical changes of the left femur and knee. No acute osseous abnormality involving the mid to distal left femur. Electronically Signed   By: Donavan Foil M.D.   On: 01/22/2016 22:20    Procedures Procedures (including critical care time)  Medications Ordered in ED Medications  cefTRIAXone (ROCEPHIN) 1 g in dextrose 5 % 50 mL IVPB (1 g Intravenous New Bag/Given 01/23/16 0028)  oxyCODONE-acetaminophen (PERCOCET/ROXICET) 5-325 MG per tablet 1 tablet (1 tablet Oral Given 01/23/16 0025)  potassium chloride SA (K-DUR,KLOR-CON) CR tablet 40 mEq (40 mEq Oral Given 01/23/16 0026)     Initial Impression / Assessment and Plan / ED Course  I have reviewed the triage vital signs and the nursing notes.  Pertinent labs & imaging results that were available during my care of the patient were reviewed by me and considered in my medical decision making (see chart for details).  Clinical Course as of Jan 23 40  Tue Jan 22, 2016  2102 Will do AMS workup including ammonia due to past ETOH history, UA, labs. No focal findings, headache, falls, etc to suggest head CT at this time  [SG]    Clinical Course User Index [SG] Sherwood Gambler, MD    Her confusion is mild/moderate,  likely coming from UTI with new incontinence. Does not appear septic. I did discuss with Dr. Percell Miller who called and is requesting that the patient get a lumbar MRI because he is concerned she is actually having radiculopathy rather than postop hip pain. I passed this along to the hospitalist. Will start oral repletion of her potassium. Dr. Hal Hope to admit, tele obs. IV Rocephin and admission to the hospital.  Final Clinical Impressions(s) / ED Diagnoses   Final diagnoses:  Urinary tract infection without hematuria, site unspecified  Confusion    New Prescriptions New Prescriptions   No medications on file     Sherwood Gambler, MD 01/23/16 [phone]

## 2016-01-22 NOTE — ED Notes (Signed)
Bed: [facility] Expected date:  Expected time:  Means of arrival:  Comments: 75 yo AMS, possible UTI

## 2016-01-22 NOTE — ED Notes (Addendum)
Daughter states patient has been taking her Trazodone and Ambien multiple time throughout the day and night. Patient reported to daughter that she is taking them to try to help with her pain.

## 2016-01-23 ENCOUNTER — Encounter (HOSPITAL_COMMUNITY): Payer: Self-pay | Admitting: Internal Medicine

## 2016-01-23 ENCOUNTER — Observation Stay (HOSPITAL_COMMUNITY): Payer: Medicare Other

## 2016-01-23 DIAGNOSIS — G92 Toxic encephalopathy: Secondary | ICD-10-CM | POA: Diagnosis present

## 2016-01-23 DIAGNOSIS — G8929 Other chronic pain: Secondary | ICD-10-CM

## 2016-01-23 DIAGNOSIS — I10 Essential (primary) hypertension: Secondary | ICD-10-CM | POA: Diagnosis present

## 2016-01-23 DIAGNOSIS — N390 Urinary tract infection, site not specified: Secondary | ICD-10-CM

## 2016-01-23 DIAGNOSIS — G9340 Encephalopathy, unspecified: Secondary | ICD-10-CM | POA: Diagnosis present

## 2016-01-23 DIAGNOSIS — R410 Disorientation, unspecified: Secondary | ICD-10-CM | POA: Diagnosis present

## 2016-01-23 DIAGNOSIS — M545 Low back pain: Secondary | ICD-10-CM | POA: Diagnosis not present

## 2016-01-23 DIAGNOSIS — K219 Gastro-esophageal reflux disease without esophagitis: Secondary | ICD-10-CM | POA: Diagnosis present

## 2016-01-23 DIAGNOSIS — Z7982 Long term (current) use of aspirin: Secondary | ICD-10-CM | POA: Diagnosis not present

## 2016-01-23 DIAGNOSIS — M5440 Lumbago with sciatica, unspecified side: Secondary | ICD-10-CM | POA: Diagnosis not present

## 2016-01-23 DIAGNOSIS — T43215A Adverse effect of selective serotonin and norepinephrine reuptake inhibitors, initial encounter: Secondary | ICD-10-CM | POA: Diagnosis present

## 2016-01-23 DIAGNOSIS — Z96653 Presence of artificial knee joint, bilateral: Secondary | ICD-10-CM | POA: Diagnosis present

## 2016-01-23 DIAGNOSIS — Z87891 Personal history of nicotine dependence: Secondary | ICD-10-CM | POA: Diagnosis not present

## 2016-01-23 DIAGNOSIS — B9620 Unspecified Escherichia coli [E. coli] as the cause of diseases classified elsewhere: Secondary | ICD-10-CM | POA: Diagnosis present

## 2016-01-23 DIAGNOSIS — L308 Other specified dermatitis: Secondary | ICD-10-CM | POA: Diagnosis present

## 2016-01-23 DIAGNOSIS — E876 Hypokalemia: Secondary | ICD-10-CM | POA: Diagnosis present

## 2016-01-23 DIAGNOSIS — G2581 Restless legs syndrome: Secondary | ICD-10-CM | POA: Diagnosis present

## 2016-01-23 DIAGNOSIS — M479 Spondylosis, unspecified: Secondary | ICD-10-CM | POA: Diagnosis present

## 2016-01-23 DIAGNOSIS — M549 Dorsalgia, unspecified: Secondary | ICD-10-CM

## 2016-01-23 DIAGNOSIS — N3941 Urge incontinence: Secondary | ICD-10-CM | POA: Diagnosis present

## 2016-01-23 DIAGNOSIS — Z8249 Family history of ischemic heart disease and other diseases of the circulatory system: Secondary | ICD-10-CM | POA: Diagnosis not present

## 2016-01-23 DIAGNOSIS — T426X5A Adverse effect of other antiepileptic and sedative-hypnotic drugs, initial encounter: Secondary | ICD-10-CM | POA: Diagnosis present

## 2016-01-23 LAB — CBC
HEMATOCRIT: 41.4 % (ref 36.0–46.0)
Hemoglobin: 14 g/dL (ref 12.0–15.0)
MCH: 30.8 pg (ref 26.0–34.0)
MCHC: 33.8 g/dL (ref 30.0–36.0)
MCV: 91.2 fL (ref 78.0–100.0)
PLATELETS: 145 10*3/uL — AB (ref 150–400)
RBC: 4.54 MIL/uL (ref 3.87–5.11)
RDW: 12.8 % (ref 11.5–15.5)
WBC: 9.8 10*3/uL (ref 4.0–10.5)

## 2016-01-23 LAB — BASIC METABOLIC PANEL
Anion gap: 6 (ref 5–15)
Anion gap: 6 (ref 5–15)
BUN: 6 mg/dL (ref 6–20)
BUN: 8 mg/dL (ref 6–20)
CALCIUM: 8.2 mg/dL — AB (ref 8.9–10.3)
CO2: 31 mmol/L (ref 22–32)
CO2: 29 mmol/L (ref 22–32)
CREATININE: 0.69 mg/dL (ref 0.44–1.00)
CREATININE: 0.84 mg/dL (ref 0.44–1.00)
Calcium: 8.2 mg/dL — ABNORMAL LOW (ref 8.9–10.3)
Chloride: 100 mmol/L — ABNORMAL LOW (ref 101–111)
Chloride: 103 mmol/L (ref 101–111)
GFR calc Af Amer: >60 mL/min (ref >60)
GFR calc Af Amer: >60 mL/min (ref >60)
GLUCOSE: 99 mg/dL (ref 65–99)
GLUCOSE: 118 mg/dL — AB (ref 65–99)
POTASSIUM: 2.9 mmol/L — AB (ref 3.5–5.1)
POTASSIUM: 4.1 mmol/L (ref 3.5–5.1)
SODIUM: 137 mmol/L (ref 135–145)
SODIUM: 138 mmol/L (ref 135–145)

## 2016-01-23 LAB — SALICYLATE LEVEL: Salicylate Lvl: <7 mg/dL (ref 2.8–30.0)

## 2016-01-23 LAB — AMMONIA: AMMONIA: 31 umol/L (ref 9–35)

## 2016-01-23 LAB — MAGNESIUM: MAGNESIUM: 1.9 mg/dL (ref 1.7–2.4)

## 2016-01-23 MED ORDER — HYDROCODONE-ACETAMINOPHEN 5-325 MG PO TABS
1.0000 | ORAL_TABLET | Freq: Four times a day (QID) | ORAL | Status: DC | PRN
  Administered 2016-01-23 (×2): 1 via ORAL
  Filled 2016-01-23 (×2): qty 1

## 2016-01-23 MED ORDER — ACETAMINOPHEN 650 MG RE SUPP: 650.0000 mg | Freq: Four times a day (QID) | RECTAL | Status: DC | PRN

## 2016-01-23 MED ORDER — METHOCARBAMOL 500 MG PO TABS
250.0000 mg | ORAL_TABLET | Freq: Two times a day (BID) | ORAL | Status: DC | PRN
  Administered 2016-01-23 – 2016-01-26 (×7): 250 mg via ORAL
  Filled 2016-01-23 (×7): qty 1

## 2016-01-23 MED ORDER — ONDANSETRON HCL 4 MG/2ML IJ SOLN
4.0000 mg | Freq: Four times a day (QID) | INTRAMUSCULAR | Status: DC | PRN
  Administered 2016-01-23 – 2016-01-24 (×2): 4 mg via INTRAVENOUS
  Filled 2016-01-23 (×2): qty 2

## 2016-01-23 MED ORDER — ACETAMINOPHEN 325 MG PO TABS
650.0000 mg | ORAL_TABLET | Freq: Four times a day (QID) | ORAL | Status: DC | PRN
  Administered 2016-01-23: 650 mg via ORAL
  Filled 2016-01-23: qty 2

## 2016-01-23 MED ORDER — PRAMIPEXOLE DIHYDROCHLORIDE 0.25 MG PO TABS
0.3750 mg | ORAL_TABLET | Freq: Two times a day (BID) | ORAL | Status: DC | PRN
  Administered 2016-01-23 – 2016-01-25 (×3): 0.375 mg via ORAL
  Filled 2016-01-23 (×5): qty 1

## 2016-01-23 MED ORDER — LORATADINE 10 MG PO TABS: 10.0000 mg | ORAL_TABLET | Freq: Every day | ORAL | Status: DC | PRN

## 2016-01-23 MED ORDER — ONDANSETRON HCL 4 MG PO TABS
4.0000 mg | ORAL_TABLET | Freq: Four times a day (QID) | ORAL | Status: DC | PRN
  Administered 2016-01-23 – 2016-01-26 (×7): 4 mg via ORAL
  Filled 2016-01-23 (×8): qty 1

## 2016-01-23 MED ORDER — CITALOPRAM HYDROBROMIDE 20 MG PO TABS
20.0000 mg | ORAL_TABLET | Freq: Every day | ORAL | Status: DC
  Administered 2016-01-23 – 2016-01-25 (×4): 20 mg via ORAL
  Filled 2016-01-23 (×4): qty 1

## 2016-01-23 MED ORDER — DEXTROSE 5 % IV SOLN
1.0000 g | INTRAVENOUS | Status: DC
  Administered 2016-01-23 – 2016-01-25 (×3): 1 g via INTRAVENOUS
  Filled 2016-01-23 (×3): qty 10

## 2016-01-23 MED ORDER — SPIRONOLACTONE 25 MG PO TABS
25.0000 mg | ORAL_TABLET | Freq: Every day | ORAL | Status: DC
  Administered 2016-01-23 – 2016-01-26 (×4): 25 mg via ORAL
  Filled 2016-01-23 (×4): qty 1

## 2016-01-23 MED ORDER — POTASSIUM CHLORIDE CRYS ER 20 MEQ PO TBCR
40.0000 meq | EXTENDED_RELEASE_TABLET | Freq: Once | ORAL | Status: AC
  Administered 2016-01-23: 40 meq via ORAL
  Filled 2016-01-23: qty 2

## 2016-01-23 MED ORDER — ENOXAPARIN SODIUM 40 MG/0.4ML ~~LOC~~ SOLN
40.0000 mg | SUBCUTANEOUS | Status: DC
  Administered 2016-01-23 – 2016-01-26 (×4): 40 mg via SUBCUTANEOUS
  Filled 2016-01-23 (×4): qty 0.4

## 2016-01-23 MED ORDER — ASPIRIN EC 81 MG PO TBEC
81.0000 mg | DELAYED_RELEASE_TABLET | Freq: Every day | ORAL | Status: DC
  Administered 2016-01-23 – 2016-01-26 (×4): 81 mg via ORAL
  Filled 2016-01-23 (×4): qty 1

## 2016-01-23 MED ORDER — OXYCODONE HCL 5 MG PO TABS
5.0000 mg | ORAL_TABLET | ORAL | Status: DC | PRN
  Administered 2016-01-23 – 2016-01-26 (×13): 5 mg via ORAL
  Filled 2016-01-23 (×14): qty 1

## 2016-01-23 MED ORDER — FLUCONAZOLE 100 MG PO TABS
150.0000 mg | ORAL_TABLET | Freq: Once | ORAL | Status: AC
  Administered 2016-01-23: 150 mg via ORAL
  Filled 2016-01-23: qty 2

## 2016-01-23 NOTE — H&P (Signed)
History and Physical    Denise Kelly [id number] DOB: Jun 09, 1940 DOA: 01/22/2016  PCP: Jonathon Bellows, MD  Patient coming from: Home.  History obtained from ER physician, patient's daughter and patient.  Chief Complaint: Confusion. Lethargy.  HPI: Denise Kelly is a 75 y.o. female was brought to the ER after patient's daughter found the patient was getting increasingly confused over the last 2 days. Patient has had surgery for her left hip in September and was in a rehabilitation. Patient is back to home last 3 weeks. Has been having increasing pain in both lower extremities and patient's orthopedic surgeon had prescribed prednisone taper dose last week for possible radiculopathy. Since patient's pain has been worsening patient's daughter states that patient had taken more than required dose of tramadol Ambien and trazodone. Last 2 days patient has been more confused. Patient in the ER was found to be lethargic but answering questions. Dr. Percell Miller, patient's orthopedic surgeon has requested MRI to rule out any L spine issues with patient having worsening pain. On my exam patient is following commands but mildly confused but oriented to person place and time. Moves all extremities.   [name] states that patient may have taken 15 of her tramadol 50 mg tablets and 10 mg tablets and also her regular dose of trazodone. No suicidal thoughts.  ED Course: X-rays of the hip and chest does not show anything acute. EKG shows normal sinus rhythm incomplete RBBB. UA shows possible UTI.  Review of Systems: As per HPI, rest all negative.   Past Medical History:  Diagnosis Date  . Alcohol abuse    ETOH and xanax  . Anxiety   . Arthritis   . Cancer ([place])   . Complication of anesthesia    "felt drunk for a week after"  . Encephalopathy   . GERD (gastroesophageal reflux disease)   . Hypertension    not being treated with medication  . Palpitations     Past Surgical History:    Procedure Laterality Date  . BREAST SURGERY     2 breast biopsies  . carotid cavernous fistula     to block  . COLONOSCOPY    . EYE SURGERY Bilateral    cataracts  . INTRAMEDULLARY (IM) NAIL INTERTROCHANTERIC Left 11/29/2015   Procedure: LEFT HIP   NAIL;  Surgeon: Renette Butters, MD;  Location: [facility];  Service: Orthopedics;  Laterality: Left;  . JOINT REPLACEMENT Left 09/2009   knee  . JOINT REPLACEMENT Right 01/02/2014  . TONSILLECTOMY    . TOTAL KNEE ARTHROPLASTY Right 01/02/2014   Dr Ronnie Derby  . TOTAL KNEE ARTHROPLASTY Right 01/02/2014   Procedure: TOTAL KNEE ARTHROPLASTY;  Surgeon: [name], MD;  Location: [facility];  Service: Orthopedics;  Laterality: Right;  . TUBAL LIGATION       reports that she has quit smoking. She has never used smokeless tobacco. She reports that she does not drink alcohol or use drugs.  Allergies  Allergen Reactions  . Sulfa Antibiotics Hives, Swelling and Other (See Comments)    Reaction:  Facial/eye swelling   . Ciprofloxacin Itching  . Motrin [Ibuprofen] Palpitations    Family History  Problem Relation Age of Onset  . Hypertension Other     Prior to Admission medications   Medication Sig Start Date End Date Taking? Authorizing Provider  acetaminophen (TYLENOL) 500 MG tablet Take 500 mg by mouth every 6 (six) hours as needed for mild pain, moderate pain or headache.  Yes Historical Provider, MD  acidophilus (RISAQUAD) CAPS capsule Take 1 capsule by mouth daily.   Yes Historical Provider, MD  aspirin EC 81 MG tablet Take 81 mg by mouth daily.   Yes Historical Provider, MD  citalopram (CELEXA) 20 MG tablet Take 1 tablet (20 mg total) by mouth at bedtime. 02/24/14  Yes [name], MD  hydrOXYzine (ATARAX/VISTARIL) 25 MG tablet Take 50 mg by mouth at bedtime as needed for anxiety (and/or sleep).    Yes Historical Provider, MD  loratadine (CLARITIN) 10 MG tablet Take 10 mg by mouth daily as needed for allergies.    Yes Historical Provider, MD   methocarbamol (ROBAXIN) 500 MG tablet Take 250 mg by mouth 2 (two) times daily as needed for muscle spasms.   Yes Historical Provider, MD  ondansetron (ZOFRAN-ODT) 4 MG disintegrating tablet Take 4 mg by mouth every 6 (six) hours as needed for nausea or vomiting.    Yes Historical Provider, MD  pramipexole (MIRAPEX) 0.125 MG tablet Take 0.375 mg by mouth 2 (two) times daily as needed (for restless leg syndrome).    Yes Historical Provider, MD  spironolactone (ALDACTONE) 25 MG tablet Take 25 mg by mouth daily.   Yes Historical Provider, MD  traMADol (ULTRAM) 50 MG tablet Take 50-100 mg by mouth every 6 (six) hours as needed for moderate pain.   Yes Historical Provider, MD  traZODone (DESYREL) 50 MG tablet Take 1 tablet (50 mg total) by mouth at bedtime. 02/24/14  Yes [name], MD  zolpidem (AMBIEN) 10 MG tablet Take 10 mg by mouth at bedtime as needed for sleep.   Yes Historical Provider, MD    Physical Exam: Vitals:   01/22/16 2226 01/22/16 2238 01/23/16 0104 01/23/16 0122  BP: 147/89  132/84 130/79  Pulse: 91  90 87  Resp: 17  22 20   Temp:  99.5 F (37.5 C)  98.4 F (36.9 C)  TempSrc:  Rectal  Oral  SpO2: 92%  91% 95%  Weight:    98 kg (216 lb 0.8 oz)  Height:    5\' 5"  (1.651 m)      Constitutional: Moderately built and nourished. Vitals:   01/22/16 2226 01/22/16 2238 01/23/16 0104 01/23/16 0122  BP: 147/89  132/84 130/79  Pulse: 91  90 87  Resp: 17  22 20   Temp:  99.5 F (37.5 C)  98.4 F (36.9 C)  TempSrc:  Rectal  Oral  SpO2: 92%  91% 95%  Weight:    98 kg (216 lb 0.8 oz)  Height:    5\' 5"  (1.651 m)   Eyes: Anicteric no pallor. ENMT: No discharge from the ears eyes nose or mouth. Neck: No mass felt. No neck rigidity. Respiratory: No rhonchi or crepitations. Cardiovascular: S1 and S2 heard. No murmurs appreciated. Abdomen: Soft nontender bowel sounds present. Musculoskeletal: No edema. No joint effusion. Skin: No rash. Skin appears warm. Neurologic: Mildly  lethargic. Oriented to time place and person. Moves all extremities. Psychiatric: Mildly lethargic. No suicidal ideation.   Labs on Admission: I have personally reviewed following labs and imaging studies  CBC:  Recent Labs Lab 01/22/16 2104  WBC 9.8  NEUTROABS 5.5  HGB 15.1*  HCT 44.7  MCV 91.8  PLT 123456*   Basic Metabolic Panel:  Recent Labs Lab 01/22/16 2104  NA 139  K 3.0*  CL 98*  CO2 32  GLUCOSE 95  BUN 6  CREATININE 0.82  CALCIUM 8.4*   GFR: Estimated Creatinine Clearance: 69.7 mL/min (  by C-G formula based on SCr of 0.82 mg/dL). Liver Function Tests:  Recent Labs Lab 01/22/16 2104  AST 18  ALT 10*  ALKPHOS 115  BILITOT 0.8  PROT 6.5  ALBUMIN 3.5   No results for input(s): LIPASE, AMYLASE in the last 168 hours.  Recent Labs Lab 01/22/16 2104  AMMONIA 14   Coagulation Profile: No results for input(s): INR, PROTIME in the last 168 hours. Cardiac Enzymes: No results for input(s): CKTOTAL, CKMB, CKMBINDEX, TROPONINI in the last 168 hours. BNP (last 3 results) No results for input(s): PROBNP in the last 8760 hours. HbA1C: No results for input(s): HGBA1C in the last 72 hours. CBG:  Recent Labs Lab 01/22/16 2140  GLUCAP 87   Lipid Profile: No results for input(s): CHOL, HDL, LDLCALC, TRIG, CHOLHDL, LDLDIRECT in the last 72 hours. Thyroid Function Tests: No results for input(s): TSH, T4TOTAL, FREET4, T3FREE, THYROIDAB in the last 72 hours. Anemia Panel: No results for input(s): VITAMINB12, FOLATE, FERRITIN, TIBC, IRON, RETICCTPCT in the last 72 hours. Urine analysis:    Component Value Date/Time   COLORURINE YELLOW 01/22/2016 2236   APPEARANCEUR CLOUDY (A) 01/22/2016 2236   LABSPEC 1.010 01/22/2016 2236   PHURINE 7.5 01/22/2016 2236   GLUCOSEU NEGATIVE 01/22/2016 2236   HGBUR TRACE (A) 01/22/2016 2236   BILIRUBINUR NEGATIVE 01/22/2016 2236   KETONESUR NEGATIVE 01/22/2016 2236   PROTEINUR NEGATIVE 01/22/2016 2236   UROBILINOGEN 1.0  11/30/2014 2105   NITRITE NEGATIVE 01/22/2016 2236   LEUKOCYTESUR MODERATE (A) 01/22/2016 2236   Sepsis Labs: @LABRCNTIP (procalcitonin:4,lacticidven:4) )No results found for this or any previous visit (from the past 240 hour(s)).   Radiological Exams on Admission: Dg Chest 2 View  Result Date: 01/22/2016 CLINICAL DATA:  Hip pain, recent surgery fever EXAM: CHEST  2 VIEW COMPARISON:  11/28/2015 FINDINGS: There are low lung volumes. There is mild cardiomegaly with mild central congestion. No overt edema. Patchy atelectasis at the left base. No pneumothorax. IMPRESSION: Cardiomegaly with mild central congestion. Patchy atelectasis at the left base. Low lung volumes with no focal consolidation. Electronically Signed   By: Donavan Foil M.D.   On: 01/22/2016 22:22   Dg Hip Unilat W Or Wo Pelvis 2-3 Views Left  Result Date: 01/22/2016 CLINICAL DATA:  Unable to bear weight, increased pain EXAM: DG HIP (WITH OR WITHOUT PELVIS) 2-3V LEFT COMPARISON:  11/28/2015 FINDINGS: Interim intra medullary rod fixation of left intertrochanteric fracture. Displaced lesser trochanteric fracture fragments is similar compared to previous exam. Hardware appears intact. Femoral head demonstrates normal positioning. Pubic symphysis is intact. Bones appear osteopenic. Fracture lucencies within the trochanter remain evident and there is little bridging callus. IMPRESSION: Intra medullary fixation of left intertrochanteric fracture with intact hardware. Displaced lesser trochanteric fracture fragment appears similar compared to the previous exam. Bones are osteopenic and fracture lines remain visible with little bridging callus evident. Electronically Signed   By: Donavan Foil M.D.   On: 01/22/2016 22:19   Dg Femur Min 2 Views Left  Result Date: 01/22/2016 CLINICAL DATA:  Left femoral fracture, hip pain EXAM: LEFT FEMUR 2 VIEWS COMPARISON:  11/29/2015 FINDINGS: Patient is status post intra medullary rodding of left  trochanteric fracture. There is also a left total knee replacement. There is satisfactory alignment. There is no acute osseous abnormality involving the visualized portions of the mid and distal femur. IMPRESSION: Postsurgical changes of the left femur and knee. No acute osseous abnormality involving the mid to distal left femur. Electronically Signed   By: Donavan Foil  M.D.   On: 01/22/2016 22:20    EKG: Independently reviewed. Normal sinus rhythm with incomplete RBBB.  Assessment/Plan Principal Problem:   Acute encephalopathy Active Problems:   Urinary tract infection without hematuria   Back pain    1. Acute encephalopathy/lethargy - likely due to medications. No suicidal thoughts. [name] states patient had taken tramadol 15 tablets of 50 mg strength with 2 of her Ambien 10 mg tablets and trazodone yesterday. I'm holding off all her above-mentioned medications for now. Closely observe. Restart medications once patient becomes more alert and awake. 2. Possible UTI - on ceftriaxone for urine cultures. 3. Back pain and lower extremity pain - Dr. Percell Miller patient's orthopedic surgeon has requested MRI of the lumbar spine which has been ordered. 4. Hypokalemia - patient is on spironolactone. Replace potassium and follow metabolic panel.   We will consult physical therapy and social work for possible placement.   DVT prophylaxis: Lovenox. Code Status: Full code.   Family Communication: [name].  Disposition Plan: To be determined.  Consults called: Physical therapy.  Admission status: Observation.    Rise Patience MD [facility] Pager [phone].  If 7PM-7AM, please contact night-coverage [email or web address] Password [email or web address]  01/23/2016, 1:59 AM

## 2016-01-23 NOTE — Consult Note (Signed)
[facility] Nurse wound consult note Reason for Consult:intertriginous dermatitis in the right inframammary, bilateral inguinal and subpannicular areas. Wound type:moisture Pressure Ulcer POA: No Measurement:diffuse dermatitis in perineal area, right inframammary.  Subpannicular area with 4cm x 3cm area without depth. Wound bed:no wound bed Drainage (amount, consistency, odor) scant serous exudate Periwound:intact, dry Dressing procedure/placement/frequency: MD (hospitalist) in to see with me today, he will provide a one-time dose of oral antifungal (diflucan).  I will provide with topical care guidance for Nursing using house antimicrobial textile ([employer]+). A therapeutic mattress with low air loss is provided for air flow.  Turning and repositioning is recommended. [facility] nursing team will not follow, but will remain available to this patient, the nursing and medical teams.  Please re-consult if needed. Thanks, Maudie Flakes, MSN, RN, [place], Arther Abbott  Pager# [phone]

## 2016-01-23 NOTE — Progress Notes (Signed)
Pharmacy Antibiotic Note  Denise Kelly is a 75 y.o. female admitted with altered mental status on 01/22/2016 with UTI.  Pharmacy has been consulted for rocephin dosing.  Plan: Rocephin 1 gm IV q24h  Height: 5\' 5"  (165.1 cm) Weight: 216 lb 0.8 oz (98 kg) IBW/kg (Calculated) : 57  Temp (24hrs), Avg:98.7 F (37.1 C), Min:98.1 F (36.7 C), Max:99.5 F (37.5 C)   Recent Labs Lab 01/22/16 2104  WBC 9.8  CREATININE 0.82    Estimated Creatinine Clearance: 69.7 mL/min (by C-G formula based on SCr of 0.82 mg/dL).    Allergies  Allergen Reactions  . Sulfa Antibiotics Hives, Swelling and Other (See Comments)    Reaction:  Facial/eye swelling   . Ciprofloxacin Itching  . Motrin [Ibuprofen] Palpitations    Antimicrobials this admission: 11/8 rocephin >>    >>   Dose adjustments this admission:   Microbiology results:  BCx:   UCx:    Sputum:    MRSA PCR:   Thank you for allowing pharmacy to be a part of this patient's care.  Pharmacy will sign off as there will be no further adjustments on this medication.   Dorrene German 01/23/2016 1:55 AM

## 2016-01-23 NOTE — Progress Notes (Signed)
Patient admitted early this AM after midnight and H and P was reviewed and agree with current plan of care. Encephalopathy has resolved and likely improvement after Abx. Patient also stated she likely took too much pain medication. Case was discussed with her Orthopedic Surgeon Dr. Percell Miller and MRI was reviewed. Based on MRI results with severe Spinal Stenosis Dr. Saintclair Halsted of Neurosurgery was consulted for further evaluation and recommendations. Patient states she was incontinent because she could not get to the bathroom in time. Hypokalemia was repleted and will continue to monitor labs. PT/OT consulted for evaluation. Will continue to monitor patient's pain and mental status.

## 2016-01-23 NOTE — Care Management Note (Signed)
Case Management Note  Patient Details  Name: Denise Kelly MRN: [id number] Date of Birth: 05-01-1940  Subjective/Objective:  75 y/o f admitted w/Acute encephalopathy. From home. Has rw. PT cons-await recc.                  Action/Plan:d/c plan home.   Expected Discharge Date:                  Expected Discharge Plan:  Home/Self Care  In-House Referral:     Discharge planning Services  CM Consult  Post Acute Care Choice:    Choice offered to:     DME Arranged:    DME Agency:     HH Arranged:    HH Agency:     Status of Service:  In process, will continue to follow  If discussed at Long Length of Stay Meetings, dates discussed:    Additional Comments:  Dessa Phi, RN 01/23/2016, 3:24 PM

## 2016-01-23 NOTE — Consult Note (Signed)
Reason for Consult: Back pain and radicular pain urinary incontinence Referring Physician: [facility]  Denise Kelly is an 75 y.o. female.  HPI: Patient is a 75 year old female whose had long-standing back pain and multiple orthopedic injuries she's recently been recovering from a hip replacement and hip surgery about 2 months ago has had some falls and had some pain in her back going down predominantly her right leg. She is followed by Dr. Percell Miller was being worked up and been recommended an outpatient MRI of her lumbar spine. However patient became acutely oozed with altered mental status was admitted hospital yesterday was diagnosed with a urinary tract infection and since initiation of antibiotics patient is Much better and has been much more clearheaded. Patient also might get an MRI scan of her lumbar spine which did show severe stenosis and we have been consult. She reports pain that radiates down the right leg down the backs of her legs to her feet top and bottom she also has most of her pain as being super sensitivity in the quad and that extends from the knee up to the hip. She does report history of urinary incontinence over the last day or 2 and urgency.  Past Medical History:  Diagnosis Date  . Alcohol abuse    ETOH and xanax  . Anxiety   . Arthritis   . Cancer ([place])   . Complication of anesthesia    "felt drunk for a week after"  . Encephalopathy   . GERD (gastroesophageal reflux disease)   . Hypertension    not being treated with medication  . Palpitations     Past Surgical History:  Procedure Laterality Date  . BREAST SURGERY     2 breast biopsies  . carotid cavernous fistula     to block  . COLONOSCOPY    . EYE SURGERY Bilateral    cataracts  . INTRAMEDULLARY (IM) NAIL INTERTROCHANTERIC Left 11/29/2015   Procedure: LEFT HIP   NAIL;  Surgeon: Renette Butters, MD;  Location: [facility];  Service: Orthopedics;  Laterality: Left;  . JOINT REPLACEMENT Left 09/2009    knee  . JOINT REPLACEMENT Right 01/02/2014  . TONSILLECTOMY    . TOTAL KNEE ARTHROPLASTY Right 01/02/2014   Dr Ronnie Derby  . TOTAL KNEE ARTHROPLASTY Right 01/02/2014   Procedure: TOTAL KNEE ARTHROPLASTY;  Surgeon: [name], MD;  Location: [facility];  Service: Orthopedics;  Laterality: Right;  . TUBAL LIGATION      Family History  Problem Relation Age of Onset  . Hypertension Other     Social History:  reports that she has quit smoking. She has never used smokeless tobacco. She reports that she does not drink alcohol or use drugs.  Allergies:  Allergies  Allergen Reactions  . Sulfa Antibiotics Hives, Swelling and Other (See Comments)    Reaction:  Facial/eye swelling   . Ciprofloxacin Itching  . Motrin [Ibuprofen] Palpitations    Medications: I have reviewed the patient's current medications.  Results for orders placed or performed during the hospital encounter of 01/22/16 (from the past 48 hour(s))  Comprehensive metabolic panel     Status: Abnormal   Collection Time: 01/22/16  9:04 PM  Result Value Ref Range   Sodium 139 135 - 145 mmol/L   Potassium 3.0 (L) 3.5 - 5.1 mmol/L   Chloride 98 (L) 101 - 111 mmol/L   CO2 32 22 - 32 mmol/L   Glucose, Bld 95 65 - 99 mg/dL   BUN 6 6 -  20 mg/dL   Creatinine, Ser 0.82 0.44 - 1.00 mg/dL   Calcium 8.4 (L) 8.9 - 10.3 mg/dL   Total Protein 6.5 6.5 - 8.1 g/dL   Albumin 3.5 3.5 - 5.0 g/dL   AST 18 15 - 41 U/L   ALT 10 (L) 14 - 54 U/L   Alkaline Phosphatase 115 38 - 126 U/L   Total Bilirubin 0.8 0.3 - 1.2 mg/dL   GFR calc non Af Amer >60 >60 mL/min   GFR calc Af Amer >60 >60 mL/min    Comment: (NOTE) The eGFR has been calculated using the CKD EPI equation. This calculation has not been validated in all clinical situations. eGFR's persistently <60 mL/min signify possible Chronic Kidney Disease.    Anion gap 9 5 - 15  CBC WITH DIFFERENTIAL     Status: Abnormal   Collection Time: 01/22/16  9:04 PM  Result Value Ref Range   WBC 9.8 4.0  - 10.5 K/uL   RBC 4.87 3.87 - 5.11 MIL/uL   Hemoglobin 15.1 (H) 12.0 - 15.0 g/dL   HCT 44.7 36.0 - 46.0 %   MCV 91.8 78.0 - 100.0 fL   MCH 31.0 26.0 - 34.0 pg   MCHC 33.8 30.0 - 36.0 g/dL   RDW 12.8 11.5 - 15.5 %   Platelets 146 (L) 150 - 400 K/uL   Neutrophils Relative % 57 %   Neutro Abs 5.5 1.7 - 7.7 K/uL   Lymphocytes Relative 32 %   Lymphs Abs 3.1 0.7 - 4.0 K/uL   Monocytes Relative 9 %   Monocytes Absolute 0.8 0.1 - 1.0 K/uL   Eosinophils Relative 2 %   Eosinophils Absolute 0.2 0.0 - 0.7 K/uL   Basophils Relative 0 %   Basophils Absolute 0.0 0.0 - 0.1 K/uL  Ammonia     Status: None   Collection Time: 01/22/16  9:04 PM  Result Value Ref Range   Ammonia 14 9 - 35 umol/L  CBG monitoring, ED     Status: None   Collection Time: 01/22/16  9:40 PM  Result Value Ref Range   Glucose-Capillary 87 65 - 99 mg/dL  Urinalysis, Routine w reflex microscopic (not at [facility])     Status: Abnormal   Collection Time: 01/22/16 10:36 PM  Result Value Ref Range   Color, Urine YELLOW YELLOW   APPearance CLOUDY (A) CLEAR   Specific Gravity, Urine 1.010 1.005 - 1.030   pH 7.5 5.0 - 8.0   Glucose, UA NEGATIVE NEGATIVE mg/dL   Hgb urine dipstick TRACE (A) NEGATIVE   Bilirubin Urine NEGATIVE NEGATIVE   Ketones, ur NEGATIVE NEGATIVE mg/dL   Protein, ur NEGATIVE NEGATIVE mg/dL   Nitrite NEGATIVE NEGATIVE   Leukocytes, UA MODERATE (A) NEGATIVE  Urine microscopic-add on     Status: Abnormal   Collection Time: 01/22/16 10:36 PM  Result Value Ref Range   Squamous Epithelial / LPF 0-5 (A) NONE SEEN   WBC, UA 6-30 0 - 5 WBC/hpf   RBC / HPF 0-5 0 - 5 RBC/hpf   Bacteria, UA MANY (A) NONE SEEN  Magnesium     Status: None   Collection Time: 01/23/16  2:14 AM  Result Value Ref Range   Magnesium 1.9 1.7 - 2.4 mg/dL  Ammonia     Status: None   Collection Time: 01/23/16  2:15 AM  Result Value Ref Range   Ammonia 31 9 - 35 umol/L  Basic metabolic panel     Status: Abnormal   Collection  Time: 01/23/16   2:15 AM  Result Value Ref Range   Sodium 137 135 - 145 mmol/L   Potassium 2.9 (L) 3.5 - 5.1 mmol/L   Chloride 100 (L) 101 - 111 mmol/L   CO2 31 22 - 32 mmol/L   Glucose, Bld 99 65 - 99 mg/dL   BUN 6 6 - 20 mg/dL   Creatinine, Ser 0.69 0.44 - 1.00 mg/dL   Calcium 8.2 (L) 8.9 - 10.3 mg/dL   GFR calc non Af Amer >60 >60 mL/min   GFR calc Af Amer >60 >60 mL/min    Comment: (NOTE) The eGFR has been calculated using the CKD EPI equation. This calculation has not been validated in all clinical situations. eGFR's persistently <60 mL/min signify possible Chronic Kidney Disease.    Anion gap 6 5 - 15  CBC     Status: Abnormal   Collection Time: 01/23/16  2:15 AM  Result Value Ref Range   WBC 9.8 4.0 - 10.5 K/uL   RBC 4.54 3.87 - 5.11 MIL/uL   Hemoglobin 14.0 12.0 - 15.0 g/dL   HCT 41.4 36.0 - 46.0 %   MCV 91.2 78.0 - 100.0 fL   MCH 30.8 26.0 - 34.0 pg   MCHC 33.8 30.0 - 36.0 g/dL   RDW 12.8 11.5 - 15.5 %   Platelets 145 (L) 220 - 254 K/uL  Salicylate level     Status: None   Collection Time: 01/23/16  2:15 AM  Result Value Ref Range   Salicylate Lvl <2.7 2.8 - 30.0 mg/dL    Dg Chest 2 View  Result Date: 01/22/2016 CLINICAL DATA:  Hip pain, recent surgery fever EXAM: CHEST  2 VIEW COMPARISON:  11/28/2015 FINDINGS: There are low lung volumes. There is mild cardiomegaly with mild central congestion. No overt edema. Patchy atelectasis at the left base. No pneumothorax. IMPRESSION: Cardiomegaly with mild central congestion. Patchy atelectasis at the left base. Low lung volumes with no focal consolidation. Electronically Signed   By: Donavan Foil M.D.   On: 01/22/2016 22:22   Mr Lumbar Spine Wo Contrast  Result Date: 01/23/2016 CLINICAL DATA:  Low back and left hip pain since left a hip replacement due to fracture 2 months ago. EXAM: MRI LUMBAR SPINE WITHOUT CONTRAST TECHNIQUE: Multiplanar, multisequence MR imaging of the lumbar spine was performed. No intravenous contrast was  administered. COMPARISON:  MRI of the abdomen 06/29/2012. FINDINGS: Segmentation:  Standard. Alignment: There is convex right scoliosis. Facet degenerative change results in 0.3 cm anterolisthesis L4 on L5. 0.3 cm degenerative retrolisthesis L1 on L2 is also identified. Vertebrae: No fracture or worrisome lesion. Multilevel degenerative endplate signal change is seen. Conus medullaris: Extends to the T12-L1 level and appears normal. Paraspinal and other soft tissues: 0.7 cm T2 hyperintense lesion in the right hepatic lobe is unchanged since the comparison study. Otherwise unremarkable. Disc levels: Patient motion on all sequences degrades the exam. T10-11 is imaged in the sagittal plane only. There is a minimal disc bulge without central canal or foraminal narrowing. T11-12: Shallow disc bulge and mild facet degenerative change. The central canal and foramina appear open. T12-L1: Disc bulge and endplate spur cause mild central canal narrowing. The left foramen is open. Moderate to moderately severe right foraminal stenosis is identified. L1-2: Shallow disc bulge and endplate spur are seen. There is ligamentum flavum thickening. Mild to moderate central canal narrowing is identified. The foramina appear mildly narrowed. L2-3: There is a disc bulge with endplate spur causing mild to  moderate central canal stenosis. Mild bilateral foraminal narrowing is worse on the left. L3-4: Mild disc bulge with endplate spur, ligamentum flavum thickening and mild to moderate facet arthropathy are seen. Moderate central canal and bilateral subarticular recess stenosis is present. The foramina appear open. L4-5: There is advanced facet degenerative change and bulky ligamentum flavum thickening. The disc is uncovered with a shallow bulge. Severe central canal and bilateral subarticular recess stenosis is present. Moderate to moderately severe foraminal narrowing is worse on the right. L5-S1: There is a shallow disc bulge and small  protrusion in the left foramen. The central canal and foramina appear open. IMPRESSION: Motion degraded examination demonstrating multilevel spondylosis appearing worst at L4-5 where there is severe central canal and bilateral subarticular recess narrowing. Advanced facet degenerative change at this level results in 0.3 cm anterolisthesis. Please see above for descriptions of individual levels. Electronically Signed   By: Inge Rise M.D.   On: 01/23/2016 07:24   Dg Hip Unilat W Or Wo Pelvis 2-3 Views Left  Result Date: 01/22/2016 CLINICAL DATA:  Unable to bear weight, increased pain EXAM: DG HIP (WITH OR WITHOUT PELVIS) 2-3V LEFT COMPARISON:  11/28/2015 FINDINGS: Interim intra medullary rod fixation of left intertrochanteric fracture. Displaced lesser trochanteric fracture fragments is similar compared to previous exam. Hardware appears intact. Femoral head demonstrates normal positioning. Pubic symphysis is intact. Bones appear osteopenic. Fracture lucencies within the trochanter remain evident and there is little bridging callus. IMPRESSION: Intra medullary fixation of left intertrochanteric fracture with intact hardware. Displaced lesser trochanteric fracture fragment appears similar compared to the previous exam. Bones are osteopenic and fracture lines remain visible with little bridging callus evident. Electronically Signed   By: Donavan Foil M.D.   On: 01/22/2016 22:19   Dg Femur Min 2 Views Left  Result Date: 01/22/2016 CLINICAL DATA:  Left femoral fracture, hip pain EXAM: LEFT FEMUR 2 VIEWS COMPARISON:  11/29/2015 FINDINGS: Patient is status post intra medullary rodding of left trochanteric fracture. There is also a left total knee replacement. There is satisfactory alignment. There is no acute osseous abnormality involving the visualized portions of the mid and distal femur. IMPRESSION: Postsurgical changes of the left femur and knee. No acute osseous abnormality involving the mid to distal  left femur. Electronically Signed   By: Donavan Foil M.D.   On: 01/22/2016 22:20    Review of Systems  Musculoskeletal: Positive for back pain, joint pain and myalgias.  Neurological: Positive for tingling.   Blood pressure 123/72, pulse 80, temperature 98.1 F (36.7 C), temperature source Oral, resp. rate 18, height _0  (1.651 m), weight 98 kg (216 lb 0.8 oz), SpO2 93 %. Physical Exam  Neurological: She has normal strength. GCS eye subscore is 4. GCS verbal subscore is 5. GCS motor subscore is 6.  Strength is somewhat pain limited but 5 out of 5 in her iliopsoas, quads, hip she's, gastrocs, and tibialis, and EHL. Sensation is grossly intact to light touch axes diffusely decreased lower extremities.    Assessment/Plan: 75 year old female orthopedic issues present gets admitted with urinary tract infection possible urosepsis much better on antibiotics. Patient is still somewhat confused and difficult to get a good history from her and localizing her pain but it does sound she does have back and radicular symptoms and certainly could be referable to her severe stenosis at L4-5. It's unclear whether this is contributing to her bladder issues I think it more likely related to her urinary tract infection. The stenosis looks like  it's chronic is not look like something that would've acutely changed or progressed. The surgery would probably require a bilateral decompression and fusion and we would not do this in the setting of a partially or untreated infection. So I would recommend continuing with physical therapy IV steroids if not contraindicated to help with pain and tingling to treat a urinary tract infection follow up with me in 1 week if discharged for reevaluation and possible scheduling of surgical decompression.  Ashyr Hedgepath P 01/23/2016, 2:31 PM

## 2016-01-23 NOTE — Care Management Obs Status (Signed)
[place] NOTIFICATION   Patient Details  Name: Denise Kelly MRN: [id number] Date of Birth: 1940-05-15   Medicare Observation Status Notification Given:  Yes    MahabirJuliann Pulse, RN 01/23/2016, 3:23 PM

## 2016-01-23 NOTE — Progress Notes (Signed)
PT Cancellation Note  Patient Details Name: ELISANDRA ZETTS MRN: [id number] DOB: [birth year]   Cancelled Treatment:    Reason Eval/Treat Not Completed: Medical issues which prohibited therapy (pt is nauseous and has RLE muscle cramps. RN notified. Will follow. )   Philomena Doheny 01/23/2016, 11:27 AM [phone]

## 2016-01-24 DIAGNOSIS — I10 Essential (primary) hypertension: Secondary | ICD-10-CM

## 2016-01-24 LAB — COMPREHENSIVE METABOLIC PANEL
ALBUMIN: 3.3 g/dL — AB (ref 3.5–5.0)
ALT: 12 U/L — ABNORMAL LOW (ref 14–54)
ANION GAP: 7 (ref 5–15)
AST: 22 U/L (ref 15–41)
Alkaline Phosphatase: 103 U/L (ref 38–126)
BILIRUBIN TOTAL: 1.3 mg/dL — AB (ref 0.3–1.2)
BUN: 8 mg/dL (ref 6–20)
CO2: 27 mmol/L (ref 22–32)
Calcium: 8.8 mg/dL — ABNORMAL LOW (ref 8.9–10.3)
Chloride: 106 mmol/L (ref 101–111)
Creatinine, Ser: 0.83 mg/dL (ref 0.44–1.00)
GFR calc Af Amer: >60 mL/min (ref >60)
GFR calc non Af Amer: >60 mL/min (ref >60)
GLUCOSE: 110 mg/dL — AB (ref 65–99)
POTASSIUM: 4.5 mmol/L (ref 3.5–5.1)
SODIUM: 140 mmol/L (ref 135–145)
TOTAL PROTEIN: 6.5 g/dL (ref 6.5–8.1)

## 2016-01-24 LAB — CBC WITH DIFFERENTIAL/PLATELET
BASOS ABS: 0 10*3/uL (ref 0.0–0.1)
Basophils Relative: 0 %
EOS ABS: 0.3 10*3/uL (ref 0.0–0.7)
EOS PCT: 5 %
HCT: 45.2 % (ref 36.0–46.0)
Hemoglobin: 14.9 g/dL (ref 12.0–15.0)
Lymphocytes Relative: 38 %
Lymphs Abs: 2.9 10*3/uL (ref 0.7–4.0)
MCH: 30.7 pg (ref 26.0–34.0)
MCHC: 33 g/dL (ref 30.0–36.0)
MCV: 93.2 fL (ref 78.0–100.0)
Monocytes Absolute: 0.8 10*3/uL (ref 0.1–1.0)
Monocytes Relative: 10 %
Neutro Abs: 3.6 10*3/uL (ref 1.7–7.7)
Neutrophils Relative %: 47 %
PLATELETS: 161 10*3/uL (ref 150–400)
RBC: 4.85 MIL/uL (ref 3.87–5.11)
RDW: 13.1 % (ref 11.5–15.5)
WBC: 7.6 10*3/uL (ref 4.0–10.5)

## 2016-01-24 LAB — PHOSPHORUS: Phosphorus: 3.9 mg/dL (ref 2.5–4.6)

## 2016-01-24 LAB — MAGNESIUM: MAGNESIUM: 2.1 mg/dL (ref 1.7–2.4)

## 2016-01-24 MED ORDER — DIPHENHYDRAMINE HCL 25 MG PO CAPS: 25.0000 mg | ORAL_CAPSULE | Freq: Once | ORAL | Status: DC

## 2016-01-24 MED ORDER — DIPHENHYDRAMINE HCL 12.5 MG/5ML PO ELIX
12.5000 mg | ORAL_SOLUTION | Freq: Once | ORAL | Status: AC
  Administered 2016-01-24: 12.5 mg via ORAL
  Filled 2016-01-24: qty 5

## 2016-01-24 NOTE — Evaluation (Signed)
Occupational Therapy Evaluation Patient Details Name: Denise Kelly MRN: [id number] DOB: December 18, 1940 Today's Date: 01/24/2016    History of Present Illness 75 yo female admitted with confusion, back pain with radiation into bil LEs. Hx of falls, L hip IM nail 11/2015   Clinical Impression   Pt was admitted for the above.  At baseline, she is mod I with toileting and ADLs (gowns only). She currently needs min A +2 for SPT and total A for hygiene.  Pt was limited by anxiety and pain during evaluation. She will benefit from continued OT to increase safety and independence.  Goals are for min guard level    Follow Up Recommendations  SNF    Equipment Recommendations  3 in 1 bedside comode    Recommendations for Other Services       Precautions / Restrictions Precautions Precautions: Fall Restrictions Weight Bearing Restrictions: No      Mobility Bed Mobility Overal bed mobility: Needs Assistance Bed Mobility: Supine to Sit;Sit to Supine     Supine to sit: Mod assist;+2 for safety/equipment;+2 for physical assistance;HOB elevated Sit to supine: Mod assist;+2 for physical assistance;+2 for safety/equipment;HOB elevated   General bed mobility comments: Assist for trunk and bil LEs. Increased time. Utilized bedpad for scooting, positioning.   Transfers Overall transfer level: Needs assistance Equipment used: Rolling walker (2 wheeled) Transfers: Sit to/from [facility] Sit to Stand: Min assist;+2 safety/equipment Stand pivot transfers: Min assist;+2 safety/equipment       General transfer comment: Assist to rise, stabilize, control descent. VCs safety, technique, hand placement. Stand pivot x2, bed<>bsc, with RW.     Balance Overall balance assessment: Needs assistance;History of Falls           Standing balance-Leahy Scale: Poor                              ADL Overall ADL's : Needs assistance/impaired     Grooming: Set  up;Sitting   Upper Body Bathing: Set up;Sitting       Upper Body Dressing : Set up;Sitting       Toilet Transfer: Minimal assistance;BSC;RW;+2 for safety/equipment;Stand-pivot   Toileting- Clothing Manipulation and Hygiene: Total assistance;+2 for safety/equipment;Sit to/from stand         General ADL Comments: pt very anxious this session.  Fearful of falling.  Pt completed SPT to [facility] and back to bed.  Able to release a hand but did not want to complete hygiene herself. Did not want to sit up in chair at this time.  At baseline, pt reports that she wears only gowns--no LB clothing.      Vision     Perception     Praxis      Pertinent Vitals/Pain Pain Assessment: Faces Faces Pain Scale: Hurts even more Pain Location: bil LEs around hip/thigh area Pain Descriptors / Indicators: Grimacing;Guarding;Sore;Aching Pain Intervention(s): Limited activity within patient's tolerance;Repositioned     Hand Dominance     Extremity/Trunk Assessment Upper Extremity Assessment Upper Extremity Assessment: Defer to OT evaluation          Communication Communication Communication: HOH   Cognition Arousal/Alertness: Awake/alert Behavior During Therapy: Anxious Overall Cognitive Status: Within Functional Limits for tasks assessed                     General Comments       Exercises       Shoulder Instructions  Home Living Family/patient expects to be discharged to:: Skilled nursing facility Living Arrangements: Alone                               Additional Comments: PTA, pt lives alone, has 2 canes and a walker, was I without AD, I ADLs. Daughter lives close by, but works. No 24/7 A available. Has tub/shower, regular toilet, 1 grab bar in bathroom.      Prior Functioning/Environment Level of Independence: Independent with assistive device(s)        Comments: using walker        OT Problem List: Decreased strength;Decreased activity  tolerance;Decreased knowledge of use of DME or AE;Pain   OT Treatment/Interventions: Self-care/ADL training;DME and/or AE instruction;Patient/family education;Balance training    OT Goals(Current goals can be found in the care plan section) Acute Rehab OT Goals Patient Stated Goal: less pain. to get better/stronger OT Goal Formulation: With patient Time For Goal Achievement: 01/31/16 Potential to Achieve Goals: Good ADL Goals Pt Will Perform Grooming: with min guard assist;standing Pt Will Transfer to Toilet: with min guard assist;ambulating;bedside commode Pt Will Perform Toileting - Clothing Manipulation and hygiene: with min guard assist;sit to/from stand Additional ADL Goal #1: pt will perform bed mobility at min guard level  OT Frequency: Min 2X/week   Barriers to D/C:            Co-evaluation PT/OT/SLP Co-Evaluation/Treatment: Yes Reason for Co-Treatment: For patient/therapist safety PT goals addressed during session: Mobility/safety with mobility OT goals addressed during session: ADL's and self-care      End of Session    Activity Tolerance: Patient limited by fatigue (and anxiety) Patient left: in bed;with call bell/phone within reach;with family/visitor present   Time: 1349-1410 OT Time Calculation (min): 21 min Charges:  OT General Charges $OT Visit: 1 Procedure OT Evaluation $OT Eval Low Complexity: 1 Procedure G-Codes:    Nikoleta Dady 30-Jan-2016, 2:29 PM  Lesle Chris, OTR/L [phone] 01/30/2016

## 2016-01-24 NOTE — NC FL2 (Signed)
[place] LEVEL OF CARE SCREENING TOOL     IDENTIFICATION  Patient Name: Denise Kelly Birthdate: 09-30-1940 Sex: female Admission Date (Current Location): 01/22/2016  [facility] and [place] Number:  Herbalist and Address:  [facility],  [address], [place]      Provider Number: [id number]  Attending Physician Name and Address:  Kerney Elbe, DO  Relative Name and Phone Number:       Current Level of Care: Hospital Recommended Level of Care: [place] Prior Approval Number:    Date Approved/Denied:   PASRR Number: [id number]  Discharge Plan: SNF    Current Diagnoses: Patient Active Problem List   Diagnosis Date Noted  . Acute encephalopathy 01/23/2016  . Back pain   . Urinary tract infection without hematuria 01/22/2016  . Hip fracture ([place]) 11/28/2015  . Closed left hip fracture ([place]) 11/28/2015  . Dysuria 11/28/2015  . Fall 11/28/2015  . RLS (restless legs syndrome) 11/28/2015  . Altered mental state 11/30/2014  . Hypokalemia 03/14/2014  . Confusion 03/14/2014  . UTI (lower urinary tract infection) 03/14/2014  . Diarrhea 03/14/2014  . Effusion of right knee 02/22/2014  . Alcohol dependence ([place]) 02/22/2014  . Substance induced mood disorder ([place]) 02/22/2014  . Hepatic encephalopathy ([place])   . Altered mental status 02/21/2014  . SIRS (systemic inflammatory response syndrome) ([place]) 02/21/2014  . Complicated UTI (urinary tract infection)   . E coli bacteremia   . Sepsis due to Escherichia coli ([place])   . Screen for STD (sexually transmitted disease)   . S/P total knee arthroplasty 01/02/2014  . Right knee pain 11/10/2013  . Essential hypertension, benign 11/10/2013  . LAFB (left anterior fascicular block) 11/10/2013  . Obesity, unspecified 11/10/2013    Orientation RESPIRATION BLADDER Height & Weight     Self, Time, Situation, Place  Normal Continent Weight: 216 lb 0.8 oz (98  kg) Height:  5\' 5"  (165.1 cm)  BEHAVIORAL SYMPTOMS/MOOD NEUROLOGICAL BOWEL NUTRITION STATUS      Continent Diet (Regular)  AMBULATORY STATUS COMMUNICATION OF NEEDS Skin   Extensive Assist Verbally Normal                       Personal Care Assistance Level of Assistance  Bathing, Dressing Bathing Assistance: Limited assistance   Dressing Assistance: Limited assistance     Functional Limitations Info             SPECIAL CARE FACTORS FREQUENCY  PT (By licensed PT), OT (By licensed OT)     PT Frequency: 5 OT Frequency: 5            Contractures      Additional Factors Info  Code Status, Allergies Code Status Info: Fullcode Allergies Info: Sulfa Antibiotics, Ciprofloxacin, Motrin Ibuprofen           Current Medications (01/24/2016):  This is the current hospital active medication list Current Facility-Administered Medications  Medication Dose Route Frequency Provider Last Rate Last Dose  . acetaminophen (TYLENOL) tablet 650 mg  650 mg Oral Q6H PRN Rise Patience, MD   650 mg at 01/23/16 [id number]   Or  . acetaminophen (TYLENOL) suppository 650 mg  650 mg Rectal Q6H PRN Rise Patience, MD      . aspirin EC tablet 81 mg  81 mg Oral Daily Rise Patience, MD   81 mg at 01/24/16 1046  . cefTRIAXone (ROCEPHIN) 1 g in dextrose 5 %  50 mL IVPB  1 g Intravenous Q24H Dorrene German, RPH   1 g at 01/23/16 2022  . citalopram (CELEXA) tablet 20 mg  20 mg Oral QHS Rise Patience, MD   20 mg at 01/23/16 2022  . enoxaparin (LOVENOX) injection 40 mg  40 mg Subcutaneous Q24H Rise Patience, MD   40 mg at 01/24/16 1047  . loratadine (CLARITIN) tablet 10 mg  10 mg Oral Daily PRN Rise Patience, MD      . methocarbamol (ROBAXIN) tablet 250 mg  250 mg Oral BID PRN Rise Patience, MD   250 mg at 01/23/16 1632  . ondansetron (ZOFRAN) tablet 4 mg  4 mg Oral Q6H PRN Rise Patience, MD   4 mg at 01/24/16 0505   Or  . ondansetron (ZOFRAN) injection 4  mg  4 mg Intravenous Q6H PRN Rise Patience, MD   4 mg at 01/23/16 1137  . oxyCODONE (Oxy IR/ROXICODONE) immediate release tablet 5 mg  5 mg Oral Q4H PRN Gardiner Barefoot, NP   5 mg at 01/24/16 1138  . pramipexole (MIRAPEX) tablet 0.375 mg  0.375 mg Oral BID PRN Rise Patience, MD   0.375 mg at 01/23/16 1057  . spironolactone (ALDACTONE) tablet 25 mg  25 mg Oral Daily Rise Patience, MD   25 mg at 01/24/16 1046     Discharge Medications: Please see discharge summary for a list of discharge medications.  Relevant Imaging Results:  Relevant Lab Results:   Additional Information SSN: [id number]  Standley Brooking, LCSW

## 2016-01-24 NOTE — Evaluation (Signed)
Physical Therapy Evaluation Patient Details Name: Denise Kelly MRN: [id number] DOB: Aug 02, 1940 Today's Date: 01/24/2016   History of Present Illness   75 yo female admitted with confusion, back pain with radiation into bil LEs. Hx of falls, L hip IM nail 11/2015  Clinical Impression  On eval, pt required Mod assist +2 for bed mobility and Min assist +2 for safety for standing and transfers. Pt is very anxious and fearful of falling. She continues to report pain in bil LEs. She tolerated activity fairly well on today. Discussed d/c plan-pt lives alone. She would require 24/7 assist currently and she doesn't have that level of care available to her. Recommend ST rehab at SNF, prior to returning home, to improve strength, activity tolerance, gait and balance.     Follow Up Recommendations SNF    Equipment Recommendations  None recommended by PT    Recommendations for Other Services       Precautions / Restrictions Precautions Precautions: Fall Restrictions Weight Bearing Restrictions: No      Mobility  Bed Mobility Overal bed mobility: Needs Assistance Bed Mobility: Supine to Sit;Sit to Supine     Supine to sit: Mod assist;+2 for safety/equipment;+2 for physical assistance;HOB elevated Sit to supine: Mod assist;+2 for physical assistance;+2 for safety/equipment;HOB elevated   General bed mobility comments: Assist for trunk and bil LEs. Increased time. Utilized bedpad for scooting, positioning.   Transfers Overall transfer level: Needs assistance Equipment used: Rolling walker (2 wheeled) Transfers: Sit to/from [facility] Sit to Stand: Min assist;+2 safety/equipment Stand pivot transfers: Min assist;+2 safety/equipment       General transfer comment: Assist to rise, stabilize, control descent. VCs safety, technique, hand placement. Stand pivot x2, bed<>bsc, with RW.   Ambulation/Gait             General Gait Details: NT-pt too anxious and fearful  of falling at this time  Stairs            Wheelchair Mobility    Modified Rankin (Stroke Patients Only)       Balance Overall balance assessment: Needs assistance;History of Falls           Standing balance-Leahy Scale: Poor                               Pertinent Vitals/Pain Pain Assessment: Faces Faces Pain Scale: Hurts even more Pain Location: bil LEs around hip/thigh area Pain Descriptors / Indicators: Grimacing;Guarding;Sore;Aching Pain Intervention(s): Limited activity within patient's tolerance;Repositioned    Home Living Family/patient expects to be discharged to:: Skilled nursing facility Living Arrangements: Alone               Additional Comments: PTA, pt lives alone, has 2 canes and a walker, was I without AD, I ADLs. Daughter lives close by, but works. No 24/7 A available. Has tub/shower, regular toilet, 1 grab bar in bathroom.    Prior Function Level of Independence: Independent with assistive device(s)         Comments: using walker     Hand Dominance        Extremity/Trunk Assessment   Upper Extremity Assessment: Defer to OT evaluation           Lower Extremity Assessment: Generalized weakness         Communication   Communication: HOH  Cognition Arousal/Alertness: Awake/alert Behavior During Therapy: Anxious Overall Cognitive Status: Within Functional Limits for tasks assessed  General Comments      Exercises     Assessment/Plan    PT Assessment Patient needs continued PT services  PT Problem List Decreased strength;Decreased mobility;Decreased activity tolerance;Decreased balance;Decreased knowledge of use of DME;Pain          PT Treatment Interventions DME instruction;Therapeutic activities;Therapeutic exercise;Gait training;Patient/family education;Balance training;Functional mobility training    PT Goals (Current goals can be found in the Care Plan section)   Acute Rehab PT Goals Patient Stated Goal: less pain. to get better/stronger PT Goal Formulation: With patient/family Time For Goal Achievement: 02/07/16 Potential to Achieve Goals: Good    Frequency Min 3X/week   Barriers to discharge        Co-evaluation   Reason for Co-Treatment: (P) For patient/therapist safety PT goals addressed during session: (P) Mobility/safety with mobility OT goals addressed during session: (P) ADL's and self-care       End of Session Equipment Utilized During Treatment: Gait belt Activity Tolerance: Patient limited by pain (Limited by anxiety) Patient left: in bed;with call bell/phone within reach;with bed alarm set;with family/visitor present           Time: 1351-1410 PT Time Calculation (min) (ACUTE ONLY): 19 min   Charges:   PT Evaluation $PT Eval Low Complexity: 1 Procedure     PT G Codes:        Weston Anna, MPT Pager: [phone]

## 2016-01-24 NOTE — Care Management Note (Signed)
Case Management Note  Patient Details  Name: Denise Kelly MRN: [id number] Date of Birth: 1941-02-03  Subjective/Objective: 75 y/o f admitted w/confusion. From home. Active w/[facility]-rep Dawn aware. PT recc SNF-CSW following.                   Action/Plan:d/c SNF.   Expected Discharge Date:                  Expected Discharge Plan:  Skilled Nursing Facility  In-House Referral:  Clinical Social Work  Discharge planning Services  CM Consult  Post Acute Care Choice:  Home Health (Active w/[facility] Choice offered to:     DME Arranged:    DME Agency:     HH Arranged:    [place] Agency:     Status of Service:  In process, will continue to follow  If discussed at Long Length of Stay Meetings, dates discussed:    Additional Comments:  Dessa Phi, RN 01/24/2016, 2:31 PM

## 2016-01-24 NOTE — Progress Notes (Signed)
PROGRESS NOTE    Denise Kelly  [id number] DOB: 10-Jun-1940 DOA: 01/22/2016 PCP: Jonathon Bellows, MD   Brief Narrative:  Denise Kelly is a 75 y.o. female was brought to the ER after patient's daughter found the patient was getting increasingly confused over the last 2 days. Patient has had surgery for her left hip in September and was in a rehabilitation. Patient is back to home last 3 weeks. Has been having increasing pain in both lower extremities and patient's orthopedic surgeon had prescribed prednisone taper dose last week for possible radiculopathy. Since patient's pain has been worsening patient's daughter states that patient had taken more than required dose of tramadol Ambien and trazodone. Last 2 days patient has been more confused. Patient in the ER was found to be lethargic but answering questions. Dr. Percell Miller, patient's orthopedic surgeon has requested MRI to rule out any L spine issues with patient having worsening pain. On my exam patient is following commands but mildly confused but oriented to person place and time. Moves all extremities.   Assessment & Plan:   Principal Problem:   Confusion Active Problems:   Urinary tract infection without hematuria   Back pain   Acute encephalopathy   Acute Metabolic Encephalopathy likely 2/2 Medication induced with Concomitant UTI -Encephalopathy Improved. -Likely Medication induced -C/w Abx of UTI -Patient has Hx of EtOH Abuse; Will Monitor for Withdrawals  Suspected UTI poA -Had Urinary Incontinence and Urgency -C/w IV Rocephin  Lower Back and Lower Extremity Radicular Pain with Concomitant Arthritis -MRI of the Back showed Motion degraded examination demonstrating multilevel spondylosis appearing worst at L4-5 where there is severe central canal and bilateral subarticular recess narrowing. Advanced facet degenerative change at this level results in 0.3 cm anterolisthesis.  -Discussed Case with Dr. Percell Miller and he has been  following her as an outpatient -Consulted Dr. Saintclair Halsted in Neurosurgery; Evaluated and recommends continuing Physical Therapy and Follow up as outpatient in 1 week for Re-Evaluation and Possible Scheduling of surgical Decompression and fusion -C/w Methocarbamol 250 mg po BIDprn, Oxycodone 5 mg q4hprn -C/w Pramipexole 0.375 mg po BIDprn for Restless Legs -PT/OT evaluated and recommend SNF  SubPannicular and Bilateral Inguinal Intreiginous Dermatitis -Wound Nurse -One time dose of Diflucan -Topical Care per Wound Nurse  Hypokalemia -Resolved -Patient's Potassium this AM was 4.5  Hypertension -Continue Spironolactone  DVT prophylaxis: Enoxaparin Code Status: FULL Family Communication: Discussed with [name] at bedside Disposition Plan: SNF  Consultants:   Orthopedic Surgery via Phone Consultation Dr. Percell Miller  Neurosurgery Dr. Saintclair Halsted  Procedures: None  Antimicrobials: IV Ceftriaxone  Subjective: Patient was seen and examined at bedside today and she was complaining of itching. Stated she felt better after receiving pain pill and slept ok. No N/V/Abdominal Pain. Confusion had cleared. No other complaints or concerns at this time. To have Physical Therapy walk her today.   Objective: Vitals:   01/23/16 1410 01/23/16 2142 01/24/16 0507 01/24/16 1300  BP: 123/72 (!) 154/89 139/85 131/66  Pulse: 80 78 82 81  Resp: 18 20 20 18   Temp: 98.1 F (36.7 C) 98.8 F (37.1 C) 97.8 F (36.6 C) 98 F (36.7 C)  TempSrc: Oral Oral Oral Oral  SpO2: 93% 95% 93% 96%  Weight:      Height:        Intake/Output Summary (Last 24 hours) at 01/24/16 1445 Last data filed at 01/24/16 1000  Gross per 24 hour  Intake  530 ml  Output              300 ml  Net              230 ml   Filed Weights   01/23/16 0122  Weight: 98 kg (216 lb 0.8 oz)    Examination: Physical Exam:  Constitutional: WN/WD, NAD and appears calm and comfortable Eyes: Lids and conjunctivae normal,  sclerae anicteric  ENMT: External Ears, Nose appear normal. Grossly normal hearing.  Neck: Appears normal, supple, no cervical masses, normal ROM, no appreciable thyromegaly, no JVD Respiratory: Clear to auscultation bilaterally, no wheezing, rales, rhonchi or crackles. Normal respiratory effort and patient is not tachypenic. No accessory muscle use.  Cardiovascular: RRR, no murmurs / rubs / gallops. S1 and S2 auscultated. No extremity edema. 2+ pedal pulses. No carotid bruits.  Abdomen: Soft, non-tender, non-distended. No masses palpated. No appreciable hepatosplenomegaly. Bowel sounds positive.  GU: Deferred. Musculoskeletal: No clubbing / cyanosis of digits/nails. No joint deformity upper and lower extremities. Decreased strength in LE from pain.  Skin: No rashes, lesions, ulcers. No induration; Warm and dry.  Neurologic: CN 2-12 grossly intact with no focal deficits. Sensation intact in all 4 Extremities. Romberg sign cerebellar reflexes not assessed.  Psychiatric: Normal judgment and insight. Alert and oriented x 3. Normal mood and pleasant appropriate affect.   Data Reviewed: I have personally reviewed following labs and imaging studies  CBC:  Recent Labs Lab 01/22/16 2104 01/23/16 0215 01/24/16 0738  WBC 9.8 9.8 7.6  NEUTROABS 5.5  --  3.6  HGB 15.1* 14.0 14.9  HCT 44.7 41.4 45.2  MCV 91.8 91.2 93.2  PLT 146* 145* [id number]   Basic Metabolic Panel:  Recent Labs Lab 01/22/16 2104 01/23/16 0214 01/23/16 0215 01/23/16 1610 01/24/16 0738  NA 139  --  137 138 140  K 3.0*  --  2.9* 4.1 4.5  CL 98*  --  100* 103 106  CO2 32  --  31 29 27   GLUCOSE 95  --  99 118* 110*  BUN 6  --  6 8 8   CREATININE 0.82  --  0.69 0.84 0.83  CALCIUM 8.4*  --  8.2* 8.2* 8.8*  MG  --  1.9  --   --  2.1  PHOS  --   --   --   --  3.9   GFR: Estimated Creatinine Clearance: 68.9 mL/min (by C-G formula based on SCr of 0.83 mg/dL). Liver Function Tests:  Recent Labs Lab 01/22/16 2104  01/24/16 0738  AST 18 22  ALT 10* 12*  ALKPHOS 115 103  BILITOT 0.8 1.3*  PROT 6.5 6.5  ALBUMIN 3.5 3.3*   No results for input(s): LIPASE, AMYLASE in the last 168 hours.  Recent Labs Lab 01/22/16 2104 01/23/16 0215  AMMONIA 14 31   Coagulation Profile: No results for input(s): INR, PROTIME in the last 168 hours. Cardiac Enzymes: No results for input(s): CKTOTAL, CKMB, CKMBINDEX, TROPONINI in the last 168 hours. BNP (last 3 results) No results for input(s): PROBNP in the last 8760 hours. HbA1C: No results for input(s): HGBA1C in the last 72 hours. CBG:  Recent Labs Lab 01/22/16 2140  GLUCAP 87   Lipid Profile: No results for input(s): CHOL, HDL, LDLCALC, TRIG, CHOLHDL, LDLDIRECT in the last 72 hours. Thyroid Function Tests: No results for input(s): TSH, T4TOTAL, FREET4, T3FREE, THYROIDAB in the last 72 hours. Anemia Panel: No results for input(s): VITAMINB12, FOLATE, FERRITIN, TIBC, IRON, RETICCTPCT in  the last 72 hours. Sepsis Labs: No results for input(s): PROCALCITON, LATICACIDVEN in the last 168 hours.  Recent Results (from the past 240 hour(s))  Urine culture     Status: Abnormal (Preliminary result)   Collection Time: 01/22/16 10:36 PM  Result Value Ref Range Status   Specimen Description URINE, CATHETERIZED  Final   Special Requests NONE  Final   Culture >=100,000 COLONIES/mL ESCHERICHIA COLI (A)  Final   Report Status PENDING  Incomplete    Radiology Studies: Dg Chest 2 View  Result Date: 01/22/2016 CLINICAL DATA:  Hip pain, recent surgery fever EXAM: CHEST  2 VIEW COMPARISON:  11/28/2015 FINDINGS: There are low lung volumes. There is mild cardiomegaly with mild central congestion. No overt edema. Patchy atelectasis at the left base. No pneumothorax. IMPRESSION: Cardiomegaly with mild central congestion. Patchy atelectasis at the left base. Low lung volumes with no focal consolidation. Electronically Signed   By: Donavan Foil M.D.   On: 01/22/2016 22:22    Mr Lumbar Spine Wo Contrast  Result Date: 01/23/2016 CLINICAL DATA:  Low back and left hip pain since left a hip replacement due to fracture 2 months ago. EXAM: MRI LUMBAR SPINE WITHOUT CONTRAST TECHNIQUE: Multiplanar, multisequence MR imaging of the lumbar spine was performed. No intravenous contrast was administered. COMPARISON:  MRI of the abdomen 06/29/2012. FINDINGS: Segmentation:  Standard. Alignment: There is convex right scoliosis. Facet degenerative change results in 0.3 cm anterolisthesis L4 on L5. 0.3 cm degenerative retrolisthesis L1 on L2 is also identified. Vertebrae: No fracture or worrisome lesion. Multilevel degenerative endplate signal change is seen. Conus medullaris: Extends to the T12-L1 level and appears normal. Paraspinal and other soft tissues: 0.7 cm T2 hyperintense lesion in the right hepatic lobe is unchanged since the comparison study. Otherwise unremarkable. Disc levels: Patient motion on all sequences degrades the exam. T10-11 is imaged in the sagittal plane only. There is a minimal disc bulge without central canal or foraminal narrowing. T11-12: Shallow disc bulge and mild facet degenerative change. The central canal and foramina appear open. T12-L1: Disc bulge and endplate spur cause mild central canal narrowing. The left foramen is open. Moderate to moderately severe right foraminal stenosis is identified. L1-2: Shallow disc bulge and endplate spur are seen. There is ligamentum flavum thickening. Mild to moderate central canal narrowing is identified. The foramina appear mildly narrowed. L2-3: There is a disc bulge with endplate spur causing mild to moderate central canal stenosis. Mild bilateral foraminal narrowing is worse on the left. L3-4: Mild disc bulge with endplate spur, ligamentum flavum thickening and mild to moderate facet arthropathy are seen. Moderate central canal and bilateral subarticular recess stenosis is present. The foramina appear open. L4-5: There is  advanced facet degenerative change and bulky ligamentum flavum thickening. The disc is uncovered with a shallow bulge. Severe central canal and bilateral subarticular recess stenosis is present. Moderate to moderately severe foraminal narrowing is worse on the right. L5-S1: There is a shallow disc bulge and small protrusion in the left foramen. The central canal and foramina appear open. IMPRESSION: Motion degraded examination demonstrating multilevel spondylosis appearing worst at L4-5 where there is severe central canal and bilateral subarticular recess narrowing. Advanced facet degenerative change at this level results in 0.3 cm anterolisthesis. Please see above for descriptions of individual levels. Electronically Signed   By: Inge Rise M.D.   On: 01/23/2016 07:24   Dg Hip Unilat W Or Wo Pelvis 2-3 Views Left  Result Date: 01/22/2016 CLINICAL DATA:  Unable  to bear weight, increased pain EXAM: DG HIP (WITH OR WITHOUT PELVIS) 2-3V LEFT COMPARISON:  11/28/2015 FINDINGS: Interim intra medullary rod fixation of left intertrochanteric fracture. Displaced lesser trochanteric fracture fragments is similar compared to previous exam. Hardware appears intact. Femoral head demonstrates normal positioning. Pubic symphysis is intact. Bones appear osteopenic. Fracture lucencies within the trochanter remain evident and there is little bridging callus. IMPRESSION: Intra medullary fixation of left intertrochanteric fracture with intact hardware. Displaced lesser trochanteric fracture fragment appears similar compared to the previous exam. Bones are osteopenic and fracture lines remain visible with little bridging callus evident. Electronically Signed   By: Donavan Foil M.D.   On: 01/22/2016 22:19   Dg Femur Min 2 Views Left  Result Date: 01/22/2016 CLINICAL DATA:  Left femoral fracture, hip pain EXAM: LEFT FEMUR 2 VIEWS COMPARISON:  11/29/2015 FINDINGS: Patient is status post intra medullary rodding of left  trochanteric fracture. There is also a left total knee replacement. There is satisfactory alignment. There is no acute osseous abnormality involving the visualized portions of the mid and distal femur. IMPRESSION: Postsurgical changes of the left femur and knee. No acute osseous abnormality involving the mid to distal left femur. Electronically Signed   By: Donavan Foil M.D.   On: 01/22/2016 22:20   Scheduled Meds: . aspirin EC  81 mg Oral Daily  . cefTRIAXone (ROCEPHIN)  IV  1 g Intravenous Q24H  . citalopram  20 mg Oral QHS  . enoxaparin (LOVENOX) injection  40 mg Subcutaneous Q24H  . spironolactone  25 mg Oral Daily   Continuous Infusions:   LOS: 1 day   Kerney Elbe, DO [facility] Pager [phone]  If 7PM-7AM, please contact night-coverage [email or web address] Password [email or web address] 01/24/2016, 2:45 PM

## 2016-01-25 DIAGNOSIS — B9620 Unspecified Escherichia coli [E. coli] as the cause of diseases classified elsewhere: Secondary | ICD-10-CM

## 2016-01-25 LAB — CBC WITH DIFFERENTIAL/PLATELET
BASOS PCT: 1 %
Basophils Absolute: 0 10*3/uL (ref 0.0–0.1)
Eosinophils Absolute: 0.4 10*3/uL (ref 0.0–0.7)
Eosinophils Relative: 5 %
HEMATOCRIT: 45.5 % (ref 36.0–46.0)
HEMOGLOBIN: 15.2 g/dL — AB (ref 12.0–15.0)
Lymphocytes Relative: 39 %
Lymphs Abs: 3.4 10*3/uL (ref 0.7–4.0)
MCH: 30.6 pg (ref 26.0–34.0)
MCHC: 33.4 g/dL (ref 30.0–36.0)
MCV: 91.7 fL (ref 78.0–100.0)
MONOS PCT: 10 %
Monocytes Absolute: 0.9 10*3/uL (ref 0.1–1.0)
NEUTROS ABS: 4 10*3/uL (ref 1.7–7.7)
NEUTROS PCT: 45 %
Platelets: 163 10*3/uL (ref 150–400)
RBC: 4.96 MIL/uL (ref 3.87–5.11)
RDW: 12.9 % (ref 11.5–15.5)
WBC: 8.7 10*3/uL (ref 4.0–10.5)

## 2016-01-25 LAB — BASIC METABOLIC PANEL
ANION GAP: 5 (ref 5–15)
BUN: 9 mg/dL (ref 6–20)
CHLORIDE: 104 mmol/L (ref 101–111)
CO2: 31 mmol/L (ref 22–32)
CREATININE: 1.02 mg/dL — AB (ref 0.44–1.00)
Calcium: 9.1 mg/dL (ref 8.9–10.3)
GFR calc non Af Amer: 53 mL/min — ABNORMAL LOW (ref >60)
Glucose, Bld: 123 mg/dL — ABNORMAL HIGH (ref 65–99)
POTASSIUM: 4.4 mmol/L (ref 3.5–5.1)
Sodium: 140 mmol/L (ref 135–145)

## 2016-01-25 LAB — URINE CULTURE: Culture: >=100000 — AB

## 2016-01-25 LAB — MAGNESIUM: Magnesium: 2 mg/dL (ref 1.7–2.4)

## 2016-01-25 LAB — PHOSPHORUS: PHOSPHORUS: 4.5 mg/dL (ref 2.5–4.6)

## 2016-01-25 NOTE — Progress Notes (Signed)
OT Cancellation Note  Patient Details Name: Denise Kelly MRN: [id number] DOB: [birth year]   Cancelled Treatment:    Reason Eval/Treat Not Completed: Other (comment).  Pt is working with PT. Will check back another day.  Josel Keo 01/25/2016, 3:28 PM  Lesle Chris, OTR/L [phone] 01/25/2016

## 2016-01-25 NOTE — Progress Notes (Signed)
PROGRESS NOTE    Denise MCEUEN  [id number] DOB: 20-Apr-1940 DOA: 01/22/2016 PCP: Jonathon Bellows, MD   Brief Narrative:  Denise Kelly is a 75 y.o. female was brought to the ER after patient's daughter found the patient was getting increasingly confused over the last 2 days. Patient has had surgery for her left hip in September and was in a rehabilitation. Patient is back to home last 3 weeks. Has been having increasing pain in both lower extremities and patient's orthopedic surgeon had prescribed prednisone taper dose last week for possible radiculopathy. Since patient's pain has been worsening patient's daughter states that patient had taken more than required dose of tramadol Ambien and trazodone. Last 2 days patient has been more confused. Patient in the ER was found to be lethargic but answering questions. She was admitted and found to have a E.Coli UTI. Encephalopathy resolved. Patient's Back pain had radicular symptoms so Neurosurgery was consulted and recommended follow up as an outpatient.  Assessment & Plan:   Principal Problem:   Confusion Active Problems:   Urinary tract infection without hematuria   Back pain   Acute encephalopathy   Acute Metabolic Encephalopathy likely 2/2 Medication induced with Concomitant UTI -Encephalopathy Improved. -Likely Medication induced -C/w Rocephin Abx of UTI -Patient has Hx of EtOH Abuse; Will Monitor for Withdrawals  E.Coli UTI poA -Urine Culture showed >100,000 CFU of E.Coli and 50,000 CFU of Strep Viridans -Had Urinary Incontinence and Urgency -C/w IV Rocephin -Sensitivities pending -Patient Asking about being placed on Azo; Recommend following up with PCP for Discussion  Lower Back and Lower Extremity Radicular Pain with Concomitant Arthritis -MRI of the Back showed Motion degraded examination demonstrating multilevel spondylosis appearing worst at L4-5 where there is severe central canal and bilateral subarticular recess  narrowing. Advanced facet degenerative change at this level results in 0.3 cm anterolisthesis.  -Discussed Case with Dr. Percell Miller and he has been following her as an outpatient -Consulted Dr. Saintclair Halsted in Neurosurgery; Evaluated and recommends continuing Physical Therapy and Follow up as outpatient in 1 week for Re-Evaluation and Possible Scheduling of surgical Decompression and fusion -C/w Methocarbamol 250 mg po BIDprn, Oxycodone 5 mg q4hprn -C/w Pramipexole 0.375 mg po BIDprn for Restless Legs -PT/OT evaluated and recommend SNF  SubPannicular and Bilateral Inguinal Intreiginous Dermatitis -Wound Nurse -One time dose of Diflucan -Topical Care per Wound Nurse  Hypertension -Continue Spironolactone  DVT prophylaxis: Enoxaparin Code Status: FULL Family Communication: No Family at bedside Disposition Plan: SNF in AM  Consultants:   Orthopedic Surgery via Phone Consultation Dr. Percell Miller  Neurosurgery Dr. Saintclair Halsted  Procedures: None  Antimicrobials: IV Ceftriaxone 01/22/16 -->  Subjective: Patient was seen and examined at bedside this morning and had some pain however states it was improving with Robaxin and Oxycodone. No N/V/Abdominal Pain. States she felt weak today and did not know if she could participate in Physical Therapy or not. No other concerns or complaints at this time.   Objective: Vitals:   01/24/16 0507 01/24/16 1300 01/24/16 2237 01/25/16 0643  BP: 139/85 131/66 (!) 159/85 139/79  Pulse: 82 81 79 80  Resp: 20 18 18 18   Temp: 97.8 F (36.6 C) 98 F (36.7 C) 98.4 F (36.9 C) 98.3 F (36.8 C)  TempSrc: Oral Oral Oral Oral  SpO2: 93% 96% 93% 93%  Weight:      Height:        Intake/Output Summary (Last 24 hours) at 01/25/16 1205 Last data filed at 01/25/16 0116  Gross  per 24 hour  Intake              410 ml  Output              800 ml  Net             -390 ml   Filed Weights   01/23/16 0122  Weight: 98 kg (216 lb 0.8 oz)    Examination: Physical  Exam:  Constitutional: WN/WD, NAD and appears calm and comfortable Eyes: Lids and conjunctivae normal, sclerae anicteric  ENMT: External Ears, Nose appear normal. Grossly normal hearing.  Neck: Appears normal, supple, no cervical masses, normal ROM, no appreciable thyromegaly, no JVD Respiratory: Clear to auscultation bilaterally, no wheezing, rales, rhonchi or crackles. Normal respiratory effort and patient is not tachypenic. No accessory muscle use.  Cardiovascular: RRR, 2/6 Systolic Murmur appreciated. S1 and S2 auscultated. No extremity edema. 2+ pedal pulses. No carotid bruits.  Abdomen: Soft, non-tender, non-distended. No masses palpated. No appreciable hepatosplenomegaly. Bowel sounds positive.  GU: Deferred. Musculoskeletal: No clubbing / cyanosis of digits/nails. No joint deformity upper and lower extremities. Decreased strength in LE from pain, Sensation in tact. Patient had a small abrasion on Right Lower leg.  Skin: No rashes, lesions, ulcers. No induration; Warm and dry. Abrasion on Right Lower leg.  Neurologic: CN 2-12 grossly intact with no focal deficits. Sensation intact in all 4 Extremities. Romberg sign cerebellar reflexes not assessed.  Psychiatric: Normal judgment and insight. Alert and oriented x 3. Normal mood and pleasant appropriate affect.   Data Reviewed: I have personally reviewed following labs and imaging studies  CBC:  Recent Labs Lab 01/22/16 2104 01/23/16 0215 01/24/16 0738 01/25/16 0354  WBC 9.8 9.8 7.6 8.7  NEUTROABS 5.5  --  3.6 4.0  HGB 15.1* 14.0 14.9 15.2*  HCT 44.7 41.4 45.2 45.5  MCV 91.8 91.2 93.2 91.7  PLT 146* 145* 161 [id number]   Basic Metabolic Panel:  Recent Labs Lab 01/22/16 2104 01/23/16 0214 01/23/16 0215 01/23/16 1610 01/24/16 0738 01/25/16 0354  NA 139  --  137 138 140 140  K 3.0*  --  2.9* 4.1 4.5 4.4  CL 98*  --  100* 103 106 104  CO2 32  --  31 29 27 31   GLUCOSE 95  --  99 118* 110* 123*  BUN 6  --  6 8 8 9   CREATININE  0.82  --  0.69 0.84 0.83 1.02*  CALCIUM 8.4*  --  8.2* 8.2* 8.8* 9.1  MG  --  1.9  --   --  2.1 2.0  PHOS  --   --   --   --  3.9 4.5   GFR: Estimated Creatinine Clearance: 56.1 mL/min (by C-G formula based on SCr of 1.02 mg/dL (H)). Liver Function Tests:  Recent Labs Lab 01/22/16 2104 01/24/16 0738  AST 18 22  ALT 10* 12*  ALKPHOS 115 103  BILITOT 0.8 1.3*  PROT 6.5 6.5  ALBUMIN 3.5 3.3*   No results for input(s): LIPASE, AMYLASE in the last 168 hours.  Recent Labs Lab 01/22/16 2104 01/23/16 0215  AMMONIA 14 31   Coagulation Profile: No results for input(s): INR, PROTIME in the last 168 hours. Cardiac Enzymes: No results for input(s): CKTOTAL, CKMB, CKMBINDEX, TROPONINI in the last 168 hours. BNP (last 3 results) No results for input(s): PROBNP in the last 8760 hours. HbA1C: No results for input(s): HGBA1C in the last 72 hours. CBG:  Recent Labs Lab 01/22/16  2140  GLUCAP 87   Lipid Profile: No results for input(s): CHOL, HDL, LDLCALC, TRIG, CHOLHDL, LDLDIRECT in the last 72 hours. Thyroid Function Tests: No results for input(s): TSH, T4TOTAL, FREET4, T3FREE, THYROIDAB in the last 72 hours. Anemia Panel: No results for input(s): VITAMINB12, FOLATE, FERRITIN, TIBC, IRON, RETICCTPCT in the last 72 hours. Sepsis Labs: No results for input(s): PROCALCITON, LATICACIDVEN in the last 168 hours.  Recent Results (from the past 240 hour(s))  Urine culture     Status: Abnormal   Collection Time: 01/22/16 10:36 PM  Result Value Ref Range Status   Specimen Description URINE, CATHETERIZED  Final   Special Requests NONE  Final   Culture (A)  Final    >=100,000 COLONIES/mL ESCHERICHIA COLI 50,000 COLONIES/mL VIRIDANS STREPTOCOCCUS    Report Status 01/25/2016 FINAL  Final    Radiology Studies: No results found. Scheduled Meds: . aspirin EC  81 mg Oral Daily  . cefTRIAXone (ROCEPHIN)  IV  1 g Intravenous Q24H  . citalopram  20 mg Oral QHS  . enoxaparin (LOVENOX)  injection  40 mg Subcutaneous Q24H  . spironolactone  25 mg Oral Daily   Continuous Infusions:   LOS: 2 days   Kerney Elbe, DO [facility] Pager [phone]  If 7PM-7AM, please contact night-coverage [email or web address] Password [email or web address] 01/25/2016, 12:05 PM

## 2016-01-25 NOTE — Progress Notes (Signed)
Physical Therapy Treatment Patient Details Name: Denise Kelly MRN: [id number] DOB: 1940/04/11 Today's Date: 01/25/2016    History of Present Illness 75 yo female admitted with confusion, back pain with radiation into bil LEs. Hx of falls, L hip IM nail 11/2015    PT Comments    Patient was seen in bed upon arrival. Bed mobility x minA to raise trunk and BLE's. Performed sit to stand x 2 x minA. Required hand held assist R/L. Performed standing pivot transfer x min A with constant VC for sequencing and redirecting. Required tactile cues to reach back for the bed before sitting. Patient stated she is very fearful of falling. Patient had high anxiety during sit to stand and transfers. Refused gait in hallway.   Believe  Pt could increase her self ability to mobilize, but self limited by cognition, fear, anxiety.   Follow Up Recommendations  SNF      Equipment Recommendations  None recommended by PT    Recommendations for Other Services       Precautions / Restrictions Precautions Precautions: Fall Restrictions Weight Bearing Restrictions: No    Mobility  Bed Mobility Overal bed mobility: Needs Assistance Bed Mobility: Supine to Sit;Sit to Supine     Supine to sit: Min assist;HOB elevated;+2 for safety/equipment Sit to supine: Min assist;HOB elevated;+2 for safety/equipment   General bed mobility comments: minA required to raise trunk and bil LE's. Increased time required. Bedpad used for scooting hips.   Transfers Overall transfer level: Needs assistance Equipment used: 2 person hand held assist Transfers: Sit to/from [facility] Sit to Stand: Min assist;+2 safety/equipment Stand pivot transfers: Min assist;+2 safety/equipment       General transfer comment: Patient is very anxious during sit to stand and standing pivot transfer. Stand pivot x 2 from bed to [facility] using hand held assist R/L.  Ambulation/Gait                 Stairs             Wheelchair Mobility    Modified Rankin (Stroke Patients Only)       Balance                                    Cognition Arousal/Alertness: Awake/alert Behavior During Therapy: Anxious Overall Cognitive Status: Within Functional Limits for tasks assessed                      Exercises      General Comments        Pertinent Vitals/Pain Pain Assessment: Faces Faces Pain Scale: Hurts even more Pain Location: bil LEs around hip/thigh area Pain Descriptors / Indicators: Grimacing;Guarding;Discomfort Pain Intervention(s): Limited activity within patient's tolerance;Repositioned;Monitored during session    Home Living                      Prior Function            PT Goals (current goals can now be found in the care plan section) Progress towards PT goals: Progressing toward goals    Frequency    Min 3X/week      PT Plan Current plan remains appropriate    Co-evaluation             End of Session Equipment Utilized During Treatment: Gait belt Activity Tolerance: Patient limited by pain (limited by anxiety) Patient left:  in bed;with call bell/phone within reach;with bed alarm set     Time: 1515-1540 PT Time Calculation (min) (ACUTE ONLY): 25 min  Charges:  $Therapeutic Activity: 23-37 mins                    G CodesHall Busing, SPTA [facility] Acute Rehab [phone]  Present and agree with above  Rica Koyanagi  PTA [facility]  Acute  Rehab Pager      [phone]

## 2016-01-25 NOTE — Clinical Social Work Placement (Signed)
CSW confirmed with Nira Conn at [facility] that they would be able to take patient over the weekend if ready.   Per Nira Conn, they would need Discharge Summary by 10am as their pharmacy closes early due to weekend.   Please call weekend CSW (ph#: [phone]) to facilitate discharge.      Raynaldo Opitz, [facility] Clinical Social Worker cell #: [phone]    Raynaldo Opitz, [facility] Clinical Social Worker cell #: [phone]     CLINICAL SOCIAL WORK PLACEMENT  NOTE  Date:  01/25/2016  Patient Details  Name: CHERAL NEYENS MRN: [id number] Date of Birth: 1941/01/03  Clinical Social Work is seeking post-discharge placement for this patient at the [place] level of care (*CSW will initial, date and re-position this form in  chart as items are completed):  Yes   Patient/family provided with [facility] Work Department's list of facilities offering this level of care within the geographic area requested by the patient (or if unable, by the patient's family).  Yes   Patient/family informed of their freedom to choose among providers that offer the needed level of care, that participate in Medicare, Medicaid or managed care program needed by the patient, have an available bed and are willing to accept the patient.  Yes   Patient/family informed of [facility]'s ownership interest in [facility] and [facility], as well as of the fact that they are under no obligation to receive care at these facilities.  PASRR submitted to EDS on       PASRR number received on       Existing PASRR number confirmed on 01/25/16     FL2 transmitted to all facilities in geographic area requested by pt/family on 01/25/16     FL2 transmitted to all facilities within larger geographic area on       Patient informed that his/her managed care company has contracts with or will negotiate with  certain facilities, including the following:        Yes   Patient/family informed of bed offers received.  Patient chooses bed at [facility] [place]     Physician recommends and patient chooses bed at      Patient to be transferred to [facility], [place] on  .  Patient to be transferred to facility by       Patient family notified on   of transfer.  Name of family member notified:        PHYSICIAN       Additional Comment:    _______________________________________________ Standley Brooking, LCSW 01/25/2016, 10:19 AM

## 2016-01-25 NOTE — Plan of Care (Signed)
Problem: Pain Managment: Goal: General experience of comfort will improve Outcome: Progressing Pain down to 5/10 with medication.

## 2016-01-25 NOTE — Clinical Social Work Note (Signed)
Clinical Social Work Assessment  Patient Details  Name: Denise Kelly MRN: [id number] Date of Birth: [birth year]  Date of referral:  01/25/16               Reason for consult:  Facility Placement                Permission sought to share information with:  Chartered certified accountant granted to share information::  Yes, Verbal Permission Granted  Name::        Agency::     Relationship::     Contact Information:     Housing/Transportation Living arrangements for the past 2 months:  Single Family Home Source of Information:  Patient, Adult Children Patient Interpreter Needed:  None Criminal Activity/Legal Involvement Pertinent to Current Situation/Hospitalization:  No - Comment as needed Significant Relationships:  Adult children Lives with:  alone Do you feel safe going back to the place where you live?  No Need for family participation in patient care:  Yes (Comment)  Care giving concerns:  CSW received consult for SNF placement, reviewed PT evaluation recommending SNF as well.    Social Worker assessment / plan:  CSW spoke with patient & daughter, Baldo Ash re: discharge planning. Patient states that she had been to [facility] in the past and would prefer to go there at discharge.   Employment status:  Retired Forensic scientist:  Medicare PT Recommendations:  [place] / Referral to community resources:  [place]  Patient/Family's Response to care:  CSW confirmed with Nira Conn at [facility] that they would be able to take patient when ready - anticipating tomorrow - Saturday, 01/26/16. Patient expressed excitement to go to rehab and that she would be going to a private room there.   Patient/Family's Understanding of and Emotional Response to Diagnosis, Current Treatment, and Prognosis:    Emotional Assessment Appearance:  Appears stated age Attitude/Demeanor/Rapport:    Affect (typically observed):     Orientation:  Oriented to Self, Oriented to Place, Oriented to  Time, Oriented to Situation Alcohol / Substance use:    Psych involvement (Current and /or in the community):     Discharge Needs  Concerns to be addressed:    Readmission within the last 30 days:    Current discharge risk:    Barriers to Discharge:      Standley Brooking, LCSW 01/25/2016, 10:16 AM

## 2016-01-26 MED ORDER — TRAMADOL HCL 50 MG PO TABS: 50.0000 mg | ORAL_TABLET | Freq: Four times a day (QID) | ORAL | 0 refills | Status: DC | PRN

## 2016-01-26 MED ORDER — LORAZEPAM 0.5 MG PO TABS
0.5000 mg | ORAL_TABLET | Freq: Once | ORAL | Status: AC
  Administered 2016-01-26: 0.5 mg via ORAL
  Filled 2016-01-26: qty 1

## 2016-01-26 MED ORDER — OXYCODONE HCL 5 MG PO TABS: 5.0000 mg | ORAL_TABLET | ORAL | 0 refills | Status: DC | PRN

## 2016-01-26 MED ORDER — DEXTROSE 5 % IV SOLN: 1.0000 g | INTRAVENOUS | 0 refills | Status: DC

## 2016-01-26 MED ORDER — DIPHENHYDRAMINE HCL 12.5 MG/5ML PO ELIX
12.5000 mg | ORAL_SOLUTION | Freq: Once | ORAL | Status: AC
  Administered 2016-01-26: 12.5 mg via ORAL
  Filled 2016-01-26: qty 5

## 2016-01-26 NOTE — Progress Notes (Signed)
Report called to [facility] RN accepting report for this facility. Pt is to be discharged with NSl for last dose of evening IV Antibiotics tonight and facility updated regarding location of NSL. VSS and no acute changes noted in Pt's assessment at time of discharge

## 2016-01-26 NOTE — Discharge Summary (Signed)
Physician Discharge Summary  [name] [id number] DOB: May 24, 1940 DOA: 01/22/2016  PCP: Jonathon Bellows, MD  Admit date: 01/22/2016 Discharge date: 01/26/2016  Admitted From: Home Disposition:  SNF  Recommendations for Outpatient Follow-up:  1. Follow up with PCP Dr. [name] in 1-2 weeks 2. Follow up with Neurosurgery Dr. Saintclair Halsted in 1 week after therapy 3. Follow up sensitivities on Urine Cx as they have not Resulted.  4. Please obtain BMP/CBC in one week  Home Health: No Equipment/Devices: None  Discharge Condition: Stable CODE STATUS: FULL Diet recommendation: Heart Healthy   Brief/Interim Summary: Denise Eshbaugh Johnstonis a 75 y.o.femalewas brought to the ER after patient's daughter found the patient was getting increasingly confused over the last 2 days. Patient has had surgery for her left hip in September and was in a rehabilitation. Patient is back to home last 3 weeks. Has been having increasing pain in both lower extremities and patient's orthopedic surgeon had prescribed prednisone taper dose last week for possible radiculopathy. Since patient's pain has been worsening patient's daughter states that patient had taken more than required dose of tramadol Ambien and trazodone. Last 2 days patient has been more confused. Patient in the ER was found to be lethargic but answering questions. She was admitted and found to have a E.Coli UTI. Encephalopathy resolved. Patient's Back pain had radicular symptoms so Neurosurgery was consulted and recommended follow up as an outpatient. She steadily improved and at this time was stable for D/C to SNF and will obtain Physical Thearpy there.   Discharge Diagnoses:  Principal Problem:   Confusion Active Problems:   Urinary tract infection without hematuria   Back pain   Acute encephalopathy  Acute Metabolic Encephalopathy likely 2/2 Medication induced with Concomitant UTI, Imporved -Encephalopathy Improved. -Likely Medication induced  from Trazadone and Ambien; D/C'd Ambien and Trazadone; Will defer to SNF to restart Trazadone -C/w Rocephin Abx of UTI -Patient has Hx of EtOH Abuse; Will Monitor for Withdrawals  E.Coli UTI poA -Urine Culture showed >100,000 CFU of E.Coli and 50,000 CFU of Strep Viridans -Had Urinary Incontinence and Urgency -C/w IV Rocephin x 1 more Day for completion of 5 Days.  -Sensitivities pending -Patient Asking about being placed on Azo; Recommend following up with PCP for Discussion  Lower Back and Lower Extremity Radicular Pain with Concomitant Arthritis -MRI of the Back showed Motion degraded examination demonstrating multilevel spondylosis appearing worst at L4-5 where there is severe central canal and bilateral subarticular recess narrowing. Advanced facet degenerative change at this level results in 0.3 cm anterolisthesis.  -Discussed Case with Dr. Percell Miller and he has been following her as an outpatient -Consulted Dr. Saintclair Halsted in Neurosurgery; Evaluated and recommends continuing Physical Therapy and Follow up as outpatient in 1 week for Re-Evaluation and Possible Scheduling of surgical Decompression and fusion -C/w Methocarbamol 250 mg po BIDprn, Oxycodone 5 mg q4hprn -C/w Pramipexole 0.375 mg po BIDprn for Restless Legs -PT/OT evaluated and recommend SNF; Continue Therapy at SNF  SubPannicular and Bilateral Inguinal Intreiginous Dermatitis -Wound Nurse -One time dose of Diflucan -Topical Care per Wound Nurse  Hypertension -Continue Spironolactone  Discharge Instructions  Discharge Instructions    Call MD for:  persistant dizziness or light-headedness    Complete by:  As directed    Call MD for:  persistant nausea and vomiting    Complete by:  As directed    Call MD for:  severe uncontrolled pain    Complete by:  As directed    Diet -  low sodium heart healthy    Complete by:  As directed    Discharge instructions    Complete by:  As directed    Follow Up at [facility]   Increase activity slowly    Complete by:  As directed        Medication List    STOP taking these medications   traZODone 50 MG tablet Commonly known as:  DESYREL   zolpidem 10 MG tablet Commonly known as:  AMBIEN     TAKE these medications   acetaminophen 500 MG tablet Commonly known as:  TYLENOL Take 500 mg by mouth every 6 (six) hours as needed for mild pain, moderate pain or headache.   acidophilus Caps capsule Take 1 capsule by mouth daily.   aspirin EC 81 MG tablet Take 81 mg by mouth daily.   cefTRIAXone 1 g in dextrose 5 % 50 mL Inject 1 g into the vein daily.   citalopram 20 MG tablet Commonly known as:  CELEXA Take 1 tablet (20 mg total) by mouth at bedtime.   CLARITIN 10 MG tablet Generic drug:  loratadine Take 10 mg by mouth daily as needed for allergies.   hydrOXYzine 25 MG tablet Commonly known as:  ATARAX/VISTARIL Take 50 mg by mouth at bedtime as needed for anxiety (and/or sleep).   methocarbamol 500 MG tablet Commonly known as:  ROBAXIN Take 250 mg by mouth 2 (two) times daily as needed for muscle spasms.   ondansetron 4 MG disintegrating tablet Commonly known as:  ZOFRAN-ODT Take 4 mg by mouth every 6 (six) hours as needed for nausea or vomiting.   oxyCODONE 5 MG immediate release tablet Commonly known as:  Oxy IR/ROXICODONE Take 1 tablet (5 mg total) by mouth every 4 (four) hours as needed for severe pain.   pramipexole 0.125 MG tablet Commonly known as:  MIRAPEX Take 0.375 mg by mouth 2 (two) times daily as needed (for restless leg syndrome).   spironolactone 25 MG tablet Commonly known as:  ALDACTONE Take 25 mg by mouth daily.   traMADol 50 MG tablet Commonly known as:  ULTRAM Take 1 tablet (50 mg total) by mouth every 6 (six) hours as needed for moderate pain. What changed:  how much to take       Allergies  Allergen Reactions  . Sulfa Antibiotics Hives, Swelling and Other (See Comments)    Reaction:  Facial/eye  swelling   . Ciprofloxacin Itching  . Motrin [Ibuprofen] Palpitations   Consultations:  Orthopedic Surgery Dr. Percell Miller  Neurosurgery Dr. Saintclair Halsted  Procedures/Studies: Dg Chest 2 View  Result Date: 01/22/2016 CLINICAL DATA:  Hip pain, recent surgery fever EXAM: CHEST  2 VIEW COMPARISON:  11/28/2015 FINDINGS: There are low lung volumes. There is mild cardiomegaly with mild central congestion. No overt edema. Patchy atelectasis at the left base. No pneumothorax. IMPRESSION: Cardiomegaly with mild central congestion. Patchy atelectasis at the left base. Low lung volumes with no focal consolidation. Electronically Signed   By: Donavan Foil M.D.   On: 01/22/2016 22:22   Mr Lumbar Spine Wo Contrast  Result Date: 01/23/2016 CLINICAL DATA:  Low back and left hip pain since left a hip replacement due to fracture 2 months ago. EXAM: MRI LUMBAR SPINE WITHOUT CONTRAST TECHNIQUE: Multiplanar, multisequence MR imaging of the lumbar spine was performed. No intravenous contrast was administered. COMPARISON:  MRI of the abdomen 06/29/2012. FINDINGS: Segmentation:  Standard. Alignment: There is convex right scoliosis. Facet degenerative change results in 0.3  cm anterolisthesis L4 on L5. 0.3 cm degenerative retrolisthesis L1 on L2 is also identified. Vertebrae: No fracture or worrisome lesion. Multilevel degenerative endplate signal change is seen. Conus medullaris: Extends to the T12-L1 level and appears normal. Paraspinal and other soft tissues: 0.7 cm T2 hyperintense lesion in the right hepatic lobe is unchanged since the comparison study. Otherwise unremarkable. Disc levels: Patient motion on all sequences degrades the exam. T10-11 is imaged in the sagittal plane only. There is a minimal disc bulge without central canal or foraminal narrowing. T11-12: Shallow disc bulge and mild facet degenerative change. The central canal and foramina appear open. T12-L1: Disc bulge and endplate spur cause mild central canal  narrowing. The left foramen is open. Moderate to moderately severe right foraminal stenosis is identified. L1-2: Shallow disc bulge and endplate spur are seen. There is ligamentum flavum thickening. Mild to moderate central canal narrowing is identified. The foramina appear mildly narrowed. L2-3: There is a disc bulge with endplate spur causing mild to moderate central canal stenosis. Mild bilateral foraminal narrowing is worse on the left. L3-4: Mild disc bulge with endplate spur, ligamentum flavum thickening and mild to moderate facet arthropathy are seen. Moderate central canal and bilateral subarticular recess stenosis is present. The foramina appear open. L4-5: There is advanced facet degenerative change and bulky ligamentum flavum thickening. The disc is uncovered with a shallow bulge. Severe central canal and bilateral subarticular recess stenosis is present. Moderate to moderately severe foraminal narrowing is worse on the right. L5-S1: There is a shallow disc bulge and small protrusion in the left foramen. The central canal and foramina appear open. IMPRESSION: Motion degraded examination demonstrating multilevel spondylosis appearing worst at L4-5 where there is severe central canal and bilateral subarticular recess narrowing. Advanced facet degenerative change at this level results in 0.3 cm anterolisthesis. Please see above for descriptions of individual levels. Electronically Signed   By: Inge Rise M.D.   On: 01/23/2016 07:24   Dg Hip Unilat W Or Wo Pelvis 2-3 Views Left  Result Date: 01/22/2016 CLINICAL DATA:  Unable to bear weight, increased pain EXAM: DG HIP (WITH OR WITHOUT PELVIS) 2-3V LEFT COMPARISON:  11/28/2015 FINDINGS: Interim intra medullary rod fixation of left intertrochanteric fracture. Displaced lesser trochanteric fracture fragments is similar compared to previous exam. Hardware appears intact. Femoral head demonstrates normal positioning. Pubic symphysis is intact. Bones  appear osteopenic. Fracture lucencies within the trochanter remain evident and there is little bridging callus. IMPRESSION: Intra medullary fixation of left intertrochanteric fracture with intact hardware. Displaced lesser trochanteric fracture fragment appears similar compared to the previous exam. Bones are osteopenic and fracture lines remain visible with little bridging callus evident. Electronically Signed   By: Donavan Foil M.D.   On: 01/22/2016 22:19   Dg Femur Min 2 Views Left  Result Date: 01/22/2016 CLINICAL DATA:  Left femoral fracture, hip pain EXAM: LEFT FEMUR 2 VIEWS COMPARISON:  11/29/2015 FINDINGS: Patient is status post intra medullary rodding of left trochanteric fracture. There is also a left total knee replacement. There is satisfactory alignment. There is no acute osseous abnormality involving the visualized portions of the mid and distal femur. IMPRESSION: Postsurgical changes of the left femur and knee. No acute osseous abnormality involving the mid to distal left femur. Electronically Signed   By: Donavan Foil M.D.   On: 01/22/2016 22:20    Subjective: Seen and examined at bedside and was doing well. No active complaints except some itching in her Iv. Ready to go to  SNF.  Discharge Exam: Vitals:   01/25/16 2058 01/26/16 0546  BP: 139/80 (!) 151/83  Pulse: 80 88  Resp: 20 16  Temp: 98.3 F (36.8 C) 98.4 F (36.9 C)   Vitals:   01/25/16 0643 01/25/16 1511 01/25/16 2058 01/26/16 0546  BP: 139/79 139/81 139/80 (!) 151/83  Pulse: 80 77 80 88  Resp: 18 18 20 16   Temp: 98.3 F (36.8 C) 98.8 F (37.1 C) 98.3 F (36.8 C) 98.4 F (36.9 C)  TempSrc: Oral Oral Oral Oral  SpO2: 93% 91% 94% 93%  Weight:      Height:       General: Pt is alert, awake, not in acute distress Cardiovascular: RRR, S1/S2 +, no rubs, no gallops Respiratory: CTA bilaterally, no wheezing, no rhonchi Abdominal: Soft, NT, ND, bowel sounds + Extremities: no edema, no cyanosis  The results of  significant diagnostics from this hospitalization (including imaging, microbiology, ancillary and laboratory) are listed below for reference.    Microbiology: Recent Results (from the past 240 hour(s))  Urine culture     Status: Abnormal   Collection Time: 01/22/16 10:36 PM  Result Value Ref Range Status   Specimen Description URINE, CATHETERIZED  Final   Special Requests NONE  Final   Culture (A)  Final    >=100,000 COLONIES/mL ESCHERICHIA COLI 50,000 COLONIES/mL VIRIDANS STREPTOCOCCUS    Report Status 01/25/2016 FINAL  Final    Labs: BNP (last 3 results) No results for input(s): BNP in the last 8760 hours. Basic Metabolic Panel:  Recent Labs Lab 01/22/16 2104 01/23/16 0214 01/23/16 0215 01/23/16 1610 01/24/16 0738 01/25/16 0354  NA 139  --  137 138 140 140  K 3.0*  --  2.9* 4.1 4.5 4.4  CL 98*  --  100* 103 106 104  CO2 32  --  31 29 27 31   GLUCOSE 95  --  99 118* 110* 123*  BUN 6  --  6 8 8 9   CREATININE 0.82  --  0.69 0.84 0.83 1.02*  CALCIUM 8.4*  --  8.2* 8.2* 8.8* 9.1  MG  --  1.9  --   --  2.1 2.0  PHOS  --   --   --   --  3.9 4.5   Liver Function Tests:  Recent Labs Lab 01/22/16 2104 01/24/16 0738  AST 18 22  ALT 10* 12*  ALKPHOS 115 103  BILITOT 0.8 1.3*  PROT 6.5 6.5  ALBUMIN 3.5 3.3*   No results for input(s): LIPASE, AMYLASE in the last 168 hours.  Recent Labs Lab 01/22/16 2104 01/23/16 0215  AMMONIA 14 31   CBC:  Recent Labs Lab 01/22/16 2104 01/23/16 0215 01/24/16 0738 01/25/16 0354  WBC 9.8 9.8 7.6 8.7  NEUTROABS 5.5  --  3.6 4.0  HGB 15.1* 14.0 14.9 15.2*  HCT 44.7 41.4 45.2 45.5  MCV 91.8 91.2 93.2 91.7  PLT 146* 145* 161 163   Cardiac Enzymes: No results for input(s): CKTOTAL, CKMB, CKMBINDEX, TROPONINI in the last 168 hours. BNP: Invalid input(s): POCBNP CBG:  Recent Labs Lab 01/22/16 2140  GLUCAP 87   D-Dimer No results for input(s): DDIMER in the last 72 hours. Hgb A1c No results for input(s): HGBA1C in the  last 72 hours. Lipid Profile No results for input(s): CHOL, HDL, LDLCALC, TRIG, CHOLHDL, LDLDIRECT in the last 72 hours. Thyroid function studies No results for input(s): TSH, T4TOTAL, T3FREE, THYROIDAB in the last 72 hours.  Invalid input(s): FREET3 Anemia work up  No results for input(s): VITAMINB12, FOLATE, FERRITIN, TIBC, IRON, RETICCTPCT in the last 72 hours. Urinalysis    Component Value Date/Time   COLORURINE YELLOW 01/22/2016 2236   APPEARANCEUR CLOUDY (A) 01/22/2016 2236   LABSPEC 1.010 01/22/2016 2236   PHURINE 7.5 01/22/2016 2236   GLUCOSEU NEGATIVE 01/22/2016 2236   HGBUR TRACE (A) 01/22/2016 2236   BILIRUBINUR NEGATIVE 01/22/2016 2236   KETONESUR NEGATIVE 01/22/2016 2236   PROTEINUR NEGATIVE 01/22/2016 2236   UROBILINOGEN 1.0 11/30/2014 2105   NITRITE NEGATIVE 01/22/2016 2236   LEUKOCYTESUR MODERATE (A) 01/22/2016 2236   Sepsis Labs Invalid input(s): PROCALCITONIN,  WBC,  LACTICIDVEN Microbiology Recent Results (from the past 240 hour(s))  Urine culture     Status: Abnormal   Collection Time: 01/22/16 10:36 PM  Result Value Ref Range Status   Specimen Description URINE, CATHETERIZED  Final   Special Requests NONE  Final   Culture (A)  Final    >=100,000 COLONIES/mL ESCHERICHIA COLI 50,000 COLONIES/mL VIRIDANS STREPTOCOCCUS    Report Status 01/25/2016 FINAL  Final   Time coordinating discharge: Over 30 minutes  SIGNED:  Kerney Elbe, DO [facility] 01/26/2016, 9:28 AM Pager [phone]  If 7PM-7AM, please contact night-coverage [email or web address] Password [email or web address]

## 2016-01-26 NOTE — Care Management Important Message (Signed)
Important Message  Patient Details  Name: HAANIYA BREWTON MRN: [id number] Date of Birth: 03-09-1941   Medicare Important Message Given:  Yes    Erenest Rasher, RN 01/26/2016, 10:04 AM

## 2016-01-26 NOTE — Clinical Social Work Placement (Signed)
Medical Social Worker facilitated patient discharge including contacting patient family and facility to confirm patient discharge plans.  Clinical information faxed to facility and family agreeable with plan.  MSW arranged ambulance transport via [facility] to [facility].  RN to call report prior to discharge.  Medical Social Worker will sign off for now as social work intervention is no longer needed. Please consult [place] again if new need arises.  CLINICAL SOCIAL WORK PLACEMENT  NOTE  Date:  01/26/2016  Patient Details  Name: Denise Kelly MRN: [id number] Date of Birth: 10/02/1940  Clinical Social Work is seeking post-discharge placement for this patient at the [place] level of care (*CSW will initial, date and re-position this form in  chart as items are completed):  Yes   Patient/family provided with [place] Work Department's list of facilities offering this level of care within the geographic area requested by the patient (or if unable, by the patient's family).  Yes   Patient/family informed of their freedom to choose among providers that offer the needed level of care, that participate in Medicare, Medicaid or managed care program needed by the patient, have an available bed and are willing to accept the patient.  Yes   Patient/family informed of [facility]'s ownership interest in [facility] and [facility], as well as of the fact that they are under no obligation to receive care at these facilities.  PASRR submitted to EDS on       PASRR number received on       Existing PASRR number confirmed on 01/25/16     FL2 transmitted to all facilities in geographic area requested by pt/family on 01/25/16     FL2 transmitted to all facilities within larger geographic area on       Patient informed that his/her managed care company has contracts with or will negotiate with certain facilities, including the following:         Yes   Patient/family informed of bed offers received.  Patient chooses bed at [facility], [place]     Physician recommends and patient chooses bed at      Patient to be transferred to [facility] on 01/26/16.  Patient to be transferred to facility by  Corey Harold )     Patient family notified on 01/26/16 of transfer.  Name of family member notified:   (Pt's dtr, Baldo Ash )     PHYSICIAN Please prepare priority discharge summary, including medications     Additional Comment:    _______________________________________________ Glendon Axe A 01/26/2016, 10:42 AM

## 2016-02-25 ENCOUNTER — Emergency Department (HOSPITAL_COMMUNITY): Payer: Medicare Other

## 2016-02-25 ENCOUNTER — Encounter (HOSPITAL_COMMUNITY): Payer: Self-pay

## 2016-02-25 ENCOUNTER — Emergency Department (HOSPITAL_COMMUNITY): Admit: 2016-02-25 | Discharge: 2016-02-25 | Disposition: A | Discharge status: Home / Self Care | Payer: Medicare Other

## 2016-02-25 ENCOUNTER — Inpatient Hospital Stay (HOSPITAL_COMMUNITY)
Admission: EM | Admit: 2016-02-25 | Discharge: 2016-02-28 | DRG: 603 | Disposition: A | Discharge status: Skilled Nursing Facility | Payer: Medicare Other | Attending: Internal Medicine | Admitting: Internal Medicine

## 2016-02-25 DIAGNOSIS — E876 Hypokalemia: Secondary | ICD-10-CM | POA: Diagnosis present

## 2016-02-25 DIAGNOSIS — Z96653 Presence of artificial knee joint, bilateral: Secondary | ICD-10-CM | POA: Diagnosis present

## 2016-02-25 DIAGNOSIS — J449 Chronic obstructive pulmonary disease, unspecified: Secondary | ICD-10-CM | POA: Diagnosis present

## 2016-02-25 DIAGNOSIS — L03115 Cellulitis of right lower limb: Principal | ICD-10-CM | POA: Diagnosis present

## 2016-02-25 DIAGNOSIS — L0390 Cellulitis, unspecified: Secondary | ICD-10-CM | POA: Diagnosis present

## 2016-02-25 DIAGNOSIS — Z79899 Other long term (current) drug therapy: Secondary | ICD-10-CM | POA: Diagnosis not present

## 2016-02-25 DIAGNOSIS — F1011 Alcohol abuse, in remission: Secondary | ICD-10-CM | POA: Diagnosis present

## 2016-02-25 DIAGNOSIS — Z96651 Presence of right artificial knee joint: Secondary | ICD-10-CM | POA: Diagnosis present

## 2016-02-25 DIAGNOSIS — I517 Cardiomegaly: Secondary | ICD-10-CM | POA: Diagnosis not present

## 2016-02-25 DIAGNOSIS — G8929 Other chronic pain: Secondary | ICD-10-CM | POA: Diagnosis present

## 2016-02-25 DIAGNOSIS — Z87891 Personal history of nicotine dependence: Secondary | ICD-10-CM

## 2016-02-25 DIAGNOSIS — R5381 Other malaise: Secondary | ICD-10-CM | POA: Diagnosis present

## 2016-02-25 DIAGNOSIS — Z7401 Bed confinement status: Secondary | ICD-10-CM | POA: Diagnosis not present

## 2016-02-25 DIAGNOSIS — M79609 Pain in unspecified limb: Secondary | ICD-10-CM | POA: Diagnosis not present

## 2016-02-25 DIAGNOSIS — K219 Gastro-esophageal reflux disease without esophagitis: Secondary | ICD-10-CM | POA: Diagnosis present

## 2016-02-25 DIAGNOSIS — M549 Dorsalgia, unspecified: Secondary | ICD-10-CM | POA: Diagnosis present

## 2016-02-25 DIAGNOSIS — Z7982 Long term (current) use of aspirin: Secondary | ICD-10-CM

## 2016-02-25 DIAGNOSIS — M545 Low back pain: Secondary | ICD-10-CM | POA: Diagnosis not present

## 2016-02-25 DIAGNOSIS — R0902 Hypoxemia: Secondary | ICD-10-CM | POA: Diagnosis present

## 2016-02-25 DIAGNOSIS — M7989 Other specified soft tissue disorders: Secondary | ICD-10-CM

## 2016-02-25 DIAGNOSIS — I10 Essential (primary) hypertension: Secondary | ICD-10-CM | POA: Diagnosis present

## 2016-02-25 DIAGNOSIS — K769 Liver disease, unspecified: Secondary | ICD-10-CM | POA: Diagnosis present

## 2016-02-25 LAB — CBC WITH DIFFERENTIAL/PLATELET
BASOS PCT: 0 %
Basophils Absolute: 0 10*3/uL (ref 0.0–0.1)
EOS ABS: 0.3 10*3/uL (ref 0.0–0.7)
Eosinophils Relative: 4 %
HCT: 41.6 % (ref 36.0–46.0)
HEMOGLOBIN: 13.7 g/dL (ref 12.0–15.0)
LYMPHS ABS: 3.3 10*3/uL (ref 0.7–4.0)
Lymphocytes Relative: 40 %
MCH: 29.4 pg (ref 26.0–34.0)
MCHC: 32.9 g/dL (ref 30.0–36.0)
MCV: 89.3 fL (ref 78.0–100.0)
MONO ABS: 0.6 10*3/uL (ref 0.1–1.0)
MONOS PCT: 7 %
NEUTROS PCT: 49 %
Neutro Abs: 3.9 10*3/uL (ref 1.7–7.7)
Platelets: 156 10*3/uL (ref 150–400)
RBC: 4.66 MIL/uL (ref 3.87–5.11)
RDW: 13.2 % (ref 11.5–15.5)
WBC: 8 10*3/uL (ref 4.0–10.5)

## 2016-02-25 LAB — COMPREHENSIVE METABOLIC PANEL
ALBUMIN: 3.2 g/dL — AB (ref 3.5–5.0)
ALK PHOS: 113 U/L (ref 38–126)
ALT: 12 U/L — AB (ref 14–54)
AST: 22 U/L (ref 15–41)
Anion gap: 7 (ref 5–15)
BUN: 7 mg/dL (ref 6–20)
CALCIUM: 8.4 mg/dL — AB (ref 8.9–10.3)
CHLORIDE: 100 mmol/L — AB (ref 101–111)
CO2: 33 mmol/L — AB (ref 22–32)
CREATININE: 0.67 mg/dL (ref 0.44–1.00)
GFR calc Af Amer: >60 mL/min (ref >60)
GFR calc non Af Amer: >60 mL/min (ref >60)
GLUCOSE: 102 mg/dL — AB (ref 65–99)
Potassium: 3.3 mmol/L — ABNORMAL LOW (ref 3.5–5.1)
SODIUM: 140 mmol/L (ref 135–145)
Total Bilirubin: 0.9 mg/dL (ref 0.3–1.2)
Total Protein: 6.2 g/dL — ABNORMAL LOW (ref 6.5–8.1)

## 2016-02-25 LAB — I-STAT CG4 LACTIC ACID, ED: LACTIC ACID, VENOUS: 0.53 mmol/L (ref 0.5–1.9)

## 2016-02-25 LAB — BRAIN NATRIURETIC PEPTIDE: B NATRIURETIC PEPTIDE 5: 33 pg/mL (ref 0.0–100.0)

## 2016-02-25 LAB — TROPONIN I: Troponin I: <0.03 ng/mL (ref <0.03)

## 2016-02-25 MED ORDER — HYDROMORPHONE HCL 1 MG/ML IJ SOLN
1.0000 mg | Freq: Once | INTRAMUSCULAR | Status: AC
  Administered 2016-02-25: 1 mg via INTRAVENOUS
  Filled 2016-02-25: qty 1

## 2016-02-25 MED ORDER — IOPAMIDOL (ISOVUE-370) INJECTION 76%
INTRAVENOUS | Status: AC
  Filled 2016-02-25: qty 100

## 2016-02-25 MED ORDER — SODIUM CHLORIDE 0.9 % IJ SOLN
INTRAMUSCULAR | Status: AC
  Filled 2016-02-25: qty 50

## 2016-02-25 MED ORDER — CEFAZOLIN IN D5W 1 GM/50ML IV SOLN
1.0000 g | Freq: Once | INTRAVENOUS | Status: AC
  Administered 2016-02-25: 1 g via INTRAVENOUS
  Filled 2016-02-25: qty 50

## 2016-02-25 MED ORDER — POTASSIUM CHLORIDE CRYS ER 20 MEQ PO TBCR
40.0000 meq | EXTENDED_RELEASE_TABLET | Freq: Once | ORAL | Status: AC
  Administered 2016-02-25: 40 meq via ORAL
  Filled 2016-02-25: qty 2

## 2016-02-25 MED ORDER — IOPAMIDOL (ISOVUE-370) INJECTION 76%
100.0000 mL | Freq: Once | INTRAVENOUS | Status: AC | PRN
  Administered 2016-02-25: 100 mL via INTRAVENOUS

## 2016-02-25 NOTE — H&P (Signed)
Denise Kelly [id number] DOB: [birth year] DOA: 02/25/2016     PCP: Jonathon Bellows, MD   Outpatient Specialists: Neurosurgery [facility] , orthopedics [facility] Patient coming from:  home Lives alone,   Family visits often    Chief Complaint: Redness swelling of lower extremity  HPI: Denise Kelly is a 75 y.o. female with medical history significant of remote history of alcohol abuse, COPD, HTN.    Presented with 3 day history of progressive right lower extremity redness and swelling also reports subjective fevers at home. The pain has become significant today and she presented to emergency department she hasn't had any associated chest pain and dyspnea she took an oxycodone prior to her ultimate she department did not help her pain but on arrival she was noted to be hypoxic requiring 2 L of oxygen. She has bilateral edema but much worse in the  Right. She denies cough occasionaly short of breath. Mobility limited after knee surgery family states patient often sits down with the right leg tingling and that may have explained some increased swelling is been having trouble taking care of herself and taking showers. Family is concerned the patient is on same at home   Regarding pertinent Chronic problems: Patient have had recurrent admissions recently she's been having worsening pain in her lower extremities and was treated with prednisone for presumed radiculopathy she was admitted beginning of November for acute encephalopathy was thought to be medication induced she was found to have at that time UTI with Escherichia coli. Patient has chronic back pain in pain clinic.    IN ER:  Temp (24hrs), Avg:98.6 F (37 C), Min:98.2 F (36.8 C), Max:98.9 F (37.2 C)   Oxygen 95% on 2L BP 127/71 Lactic acid 0.53 potassium 3.3 creatinine 0.67 abdomen 3.2 WBC 8.0 hemoglobin 13.7 BNP 33 troponin less than 0.03 No evidence of DVT per ultrasound No evidence of PE per CT scan evidence of liver lesions  hypodense throughout Chest x-ray negative Following Medications were ordered in ER: Medications  ceFAZolin (ANCEF) IVPB 1 g/50 mL premix (1 g Intravenous New Bag/Given 02/25/16 2017)  potassium chloride SA (K-DUR,KLOR-CON) CR tablet 40 mEq (not administered)  HYDROmorphone (DILAUDID) injection 1 mg (1 mg Intravenous Given 02/25/16 1851)     Hospitalist was called for admission forCellulitis  Review of Systems:    Pertinent positives include:  Fevers, chills, Bilateral lower extremity swelling right more than left    Constitutional:  No weight loss, night sweats  fatigue, weight loss  HEENT:  No headaches, Difficulty swallowing,Tooth/dental problems,Sore throat,  No sneezing, itching, ear ache, nasal congestion, post nasal drip,  Cardio-vascular:  No chest pain, Orthopnea, PND, anasarca, dizziness, palpitations.noGI:  No heartburn, indigestion, abdominal pain, nausea, vomiting, diarrhea, change in bowel habits, loss of appetite, melena, blood in stool, hematemesis Resp:  no shortness of breath at rest. No dyspnea on exertion, No excess mucus, no productive cough, No non-productive cough, No coughing up of blood.No change in color of mucus.No wheezing. Skin:  no rash or lesions. No jaundice GU:  no dysuria, change in color of urine, no urgency or frequency. No straining to urinate.  No flank pain.  Musculoskeletal:  No joint pain or no joint swelling. No decreased range of motion. No back pain.  Psych:  No change in mood or affect. No depression or anxiety. No memory loss.  Neuro: no localizing neurological complaints, no tingling, no weakness, no double vision, no gait abnormality, no slurred speech, no confusion  As per HPI otherwise 10 point review of systems negative.   Past Medical History: Past Medical History:  Diagnosis Date  . Alcohol abuse    ETOH and xanax  . Anxiety   . Arthritis   . Cancer ([place])   . Complication of anesthesia    "felt drunk for a week after"   . Encephalopathy   . GERD (gastroesophageal reflux disease)   . Hypertension    not being treated with medication  . Palpitations    Past Surgical History:  Procedure Laterality Date  . BREAST SURGERY     2 breast biopsies  . carotid cavernous fistula     to block  . COLONOSCOPY    . EYE SURGERY Bilateral    cataracts  . INTRAMEDULLARY (IM) NAIL INTERTROCHANTERIC Left 11/29/2015   Procedure: LEFT HIP   NAIL;  Surgeon: Renette Butters, MD;  Location: [facility];  Service: Orthopedics;  Laterality: Left;  . JOINT REPLACEMENT Left 09/2009   knee  . JOINT REPLACEMENT Right 01/02/2014  . TONSILLECTOMY    . TOTAL KNEE ARTHROPLASTY Right 01/02/2014   Dr Ronnie Derby  . TOTAL KNEE ARTHROPLASTY Right 01/02/2014   Procedure: TOTAL KNEE ARTHROPLASTY;  Surgeon: [name], MD;  Location: [facility];  Service: Orthopedics;  Laterality: Right;  . TUBAL LIGATION       Social History:  Ambulatory walker     reports that she has quit smoking. She has never used smokeless tobacco. She reports that she does not drink alcohol or use drugs.  Allergies:   Allergies  Allergen Reactions  . Sulfa Antibiotics Hives, Swelling and Other (See Comments)    Reaction:  Facial/eye swelling   . Ciprofloxacin Itching  . Motrin [Ibuprofen] Palpitations       Family History:   Family History  Problem Relation Age of Onset  . Hypertension Other   . Cancer Mother   . Cancer Father     Medications: Prior to Admission medications   Medication Sig Start Date End Date Taking? Authorizing Provider  acetaminophen (TYLENOL) 500 MG tablet Take 1,000 mg by mouth every 6 (six) hours as needed for mild pain, moderate pain or headache.    Yes Historical Provider, MD  acidophilus (RISAQUAD) CAPS capsule Take 1 capsule by mouth daily.   Yes Historical Provider, MD  aspirin EC 81 MG tablet Take 81 mg by mouth at bedtime.    Yes Historical Provider, MD  citalopram (CELEXA) 20 MG tablet Take 1 tablet (20 mg total) by mouth  at bedtime. 02/24/14  Yes [name], MD  gabapentin (NEURONTIN) 100 MG capsule Take 100-300 mg by mouth 3 (three) times daily. Pt takes one in the morning, one at noon, and three at bedtime.   Yes Historical Provider, MD  hydrOXYzine (ATARAX/VISTARIL) 25 MG tablet Take 50 mg by mouth at bedtime as needed for anxiety (and/or sleep).    Yes Historical Provider, MD  loratadine (CLARITIN) 10 MG tablet Take 10 mg by mouth daily as needed for allergies.    Yes Historical Provider, MD  methocarbamol (ROBAXIN) 500 MG tablet Take 250 mg by mouth 2 (two) times daily as needed for muscle spasms.   Yes Historical Provider, MD  ondansetron (ZOFRAN-ODT) 4 MG disintegrating tablet Take 4 mg by mouth every 6 (six) hours as needed for nausea or vomiting.    Yes Historical Provider, MD  oxyCODONE (OXY IR/ROXICODONE) 5 MG immediate release tablet Take 1 tablet (5 mg total) by mouth every 4 (four) hours  as needed for severe pain. 01/26/16  Yes Garrett, DO  pramipexole (MIRAPEX) 0.125 MG tablet Take 0.375 mg by mouth 2 (two) times daily as needed (for restless leg syndrome).    Yes Historical Provider, MD  spironolactone (ALDACTONE) 25 MG tablet Take 25 mg by mouth daily.   Yes Historical Provider, MD  traMADol (ULTRAM) 50 MG tablet Take 1 tablet (50 mg total) by mouth every 6 (six) hours as needed for moderate pain. 01/26/16  Yes Br[place], DO  traZODone (DESYREL) 50 MG tablet Take 50 mg by mouth at bedtime as needed for sleep.   Yes Historical Provider, MD    Physical Exam: Patient Vitals for the past 24 hrs:  BP Temp Temp src Pulse Resp SpO2 Height Weight  02/25/16 1933 127/71 98.9 F (37.2 C) Rectal 95 24 95 % - -  02/25/16 1723 - - - - - 96 % - -  02/25/16 1721 - - - - - 90 % 5\' 6"  (1.676 m) 95.3 kg (210 lb)  02/25/16 1719 122/72 98.2 F (36.8 C) Oral 87 18 (!) 89 % - -  02/25/16 1710 - - - - - 94 % - -    1. General:  in No Acute distress 2. Psychological: Alert and   Oriented 3.  Head/ENT:    Dry Mucous Membranes                          Head Non traumatic, neck supple                            Poor Dentition 4. SKIN:   decreased Skin turgor,  Skin clean Dry and intact  Red swelling right      5. Heart: Regular rate and rhythm no Murmur, Rub or gallop 6. Lungs:  Diminished through out no wheezes or crackles   7. Abdomen: Soft,  non-tender, Non distended obese 8. Lower extremities: no clubbing, cyanosis, or edema 9. Neurologically Grossly intact, moving all 4 extremities equally   10. MSK: Normal range of motion   body mass index is 33.89 kg/m.  Labs on Admission:   Labs on Admission: I have personally reviewed following labs and imaging studies  CBC:  Recent Labs Lab 02/25/16 1839  WBC 8.0  NEUTROABS 3.9  HGB 13.7  HCT 41.6  MCV 89.3  PLT [id number]   Basic Metabolic Panel:  Recent Labs Lab 02/25/16 1839  NA 140  K 3.3*  CL 100*  CO2 33*  GLUCOSE 102*  BUN 7  CREATININE 0.67  CALCIUM 8.4*   GFR: Estimated Creatinine Clearance: 71.8 mL/min (by C-G formula based on SCr of 0.67 mg/dL). Liver Function Tests:  Recent Labs Lab 02/25/16 1839  AST 22  ALT 12*  ALKPHOS 113  BILITOT 0.9  PROT 6.2*  ALBUMIN 3.2*   No results for input(s): LIPASE, AMYLASE in the last 168 hours. No results for input(s): AMMONIA in the last 168 hours. Coagulation Profile: No results for input(s): INR, PROTIME in the last 168 hours. Cardiac Enzymes:  Recent Labs Lab 02/25/16 1839  TROPONINI <0.03   BNP (last 3 results) No results for input(s): PROBNP in the last 8760 hours. HbA1C: No results for input(s): HGBA1C in the last 72 hours. CBG: No results for input(s): GLUCAP in the last 168 hours. Lipid Profile: No results for input(s): CHOL, HDL, LDLCALC, TRIG, CHOLHDL, LDLDIRECT in the last  72 hours. Thyroid Function Tests: No results for input(s): TSH, T4TOTAL, FREET4, T3FREE, THYROIDAB in the last 72 hours. Anemia Panel: No results for input(s):  VITAMINB12, FOLATE, FERRITIN, TIBC, IRON, RETICCTPCT in the last 72 hours.  Sepsis Labs: @LABRCNTIP (procalcitonin:4,lacticidven:4) )No results found for this or any previous visit (from the past 240 hour(s)).    UA  not ordered  Lab Results  Component Value Date   HGBA1C 5.5 11/30/2014    Estimated Creatinine Clearance: 71.8 mL/min (by C-G formula based on SCr of 0.67 mg/dL).  BNP (last 3 results) No results for input(s): PROBNP in the last 8760 hours.   ECG REPORT  Independently reviewed Rate: 91  Rhythm: Normal sinus rhythm ST&T Change: No acute ischemic changes   QTC 468  Filed Weights   02/25/16 1721  Weight: 95.3 kg (210 lb)     Cultures:    Component Value Date/Time   SDES URINE, CATHETERIZED 01/22/2016 2236   SPECREQUEST NONE 01/22/2016 2236   CULT (A) 01/22/2016 2236    >=100,000 COLONIES/mL ESCHERICHIA COLI 50,000 COLONIES/mL VIRIDANS STREPTOCOCCUS    REPTSTATUS 01/25/2016 FINAL 01/22/2016 2236     Radiological Exams on Admission: Dg Chest 2 View  Result Date: 02/25/2016 CLINICAL DATA:  Hypoxia.  Right lower leg edema. EXAM: CHEST  2 VIEW COMPARISON:  01/22/2016 FINDINGS: Cardiac silhouette is normal in size. No mediastinal or hilar masses. No evidence of adenopathy. Clear lungs.  No pleural effusion.  No pneumothorax. Skeletal structures are demineralized but grossly intact. IMPRESSION: No active cardiopulmonary disease. Electronically Signed   By: Lajean Manes M.D.   On: 02/25/2016 19:28   Dg Tibia/fibula Right  Result Date: 02/25/2016 CLINICAL DATA:  Hypoxia, edema and erythema of right lower leg EXAM: RIGHT TIBIA AND FIBULA - 2 VIEW COMPARISON:  03/13/2014 FINDINGS: The patient is status post right knee replacement with similar hardware alignment. There is no fracture or dislocation. Marked subcutaneous edema. No bony erosive change or periostitis. No large joint effusion. IMPRESSION: 1. No acute osseous abnormality status post right knee replacement  2. Moderate to marked subcutaneous edema Electronically Signed   By: Donavan Foil M.D.   On: 02/25/2016 19:29    Chart has been reviewed    Assessment/Plan  75 y.o. female with medical history significant of remote history of alcohol abuse, COPD, HTN. Begin admitted for right leg cellulitis  Present on Admission:  . Cellulitis- -admit per cellulitis protocol will        continue Rocephin      Will obtain MRSA screening,       obtain blood cultures if febrile or septic     further antibiotic adjustment pending above results  . Hypokalemia replace and check magnesium levels . Chronic back pain stable continue home medications . Hypoxia patient has been diagnosed in the past with COPD supposed to take inhalers sure she is actually doing this. No evidence of PE. We'll need pulse oximetry evaluation prior to discharge . Lesion of liver will obtain right upper quadrant ultrasound . Essential hypertension, benign stable continue home medications Alcohol abuse in the past currently in remission  Other plan as per orders.  DVT prophylaxis:   Lovenox     Code Status:  FULL CODE as per patient   Family Communication:   Family not  at  Bedside  plan of care was discussed with   Daughter,   Disposition Plan:    likely will need placement for rehabilitation  Would benefit from PT/OT eval prior to DC   ordered       Patient's family is concerned that she has not been safe at home may need placement Consults called: none  Admission status:    inpatient    Level of care     medical floor         I have spent a total of 56 min on this admission   Renee Erb 02/25/2016, 11:19 PM    [facility]  Pager [phone]   after 2 AM please page floor coverage PA If 7AM-7PM, please contact the day team taking care of the patient  [email or web address]  Password [email or web address]

## 2016-02-25 NOTE — ED Notes (Signed)
Bed: [id number] Expected date:  Expected time:  Means of arrival:  Comments: EMS- 76yo F, cellulitis/fever

## 2016-02-25 NOTE — ED Provider Notes (Signed)
[facility] DEPT Provider Note   CSN: [id number] Arrival date & time: 02/25/16  1700     History   Chief Complaint Chief Complaint  Patient presents with  . Cellulitis    HPI Denise Kelly is a 75 y.o. female.  HPI  75 year old female presents with right lower extremity swelling and redness. She states the redness are particularly worse today. She has been having worsening swelling over past 3-5 days since she got a new bed since she can't elevate her extremities like normal. Denies chest pain, dyspnea. Does not wear oxygen normally. Took an oxycodone prior to arrival but it has not helped. Denies any injuries. Her left lower extremity is mildly swollen but she says no or near the right. She is concerned it's infected. Has not noticed any weeping or drainage. Patient states her physical therapist told her she had a fever. She does not know what the temperature number was.  Past Medical History:  Diagnosis Date  . Alcohol abuse    ETOH and xanax  . Anxiety   . Arthritis   . Cancer ([place])   . Complication of anesthesia    "felt drunk for a week after"  . Encephalopathy   . GERD (gastroesophageal reflux disease)   . Hypertension    not being treated with medication  . Palpitations     Patient Active Problem List   Diagnosis Date Noted  . Cellulitis 02/25/2016  . Chronic back pain 02/25/2016  . Hypoxia 02/25/2016  . Lesion of liver 02/25/2016  . Acute encephalopathy 01/23/2016  . Back pain   . Urinary tract infection without hematuria 01/22/2016  . Hip fracture ([place]) 11/28/2015  . Closed left hip fracture ([place]) 11/28/2015  . Dysuria 11/28/2015  . Fall 11/28/2015  . RLS (restless legs syndrome) 11/28/2015  . Altered mental state 11/30/2014  . Hypokalemia 03/14/2014  . Confusion 03/14/2014  . UTI (lower urinary tract infection) 03/14/2014  . Diarrhea 03/14/2014  . Effusion of right knee 02/22/2014  . Alcohol dependence ([place]) 02/22/2014  . Substance induced  mood disorder ([place]) 02/22/2014  . Hepatic encephalopathy ([place])   . Altered mental status 02/21/2014  . SIRS (systemic inflammatory response syndrome) ([place]) 02/21/2014  . Complicated UTI (urinary tract infection)   . E coli bacteremia   . Sepsis due to Escherichia coli ([place])   . Screen for STD (sexually transmitted disease)   . S/P total knee arthroplasty 01/02/2014  . Right knee pain 11/10/2013  . Essential hypertension, benign 11/10/2013  . LAFB (left anterior fascicular block) 11/10/2013  . Obesity, unspecified 11/10/2013    Past Surgical History:  Procedure Laterality Date  . BREAST SURGERY     2 breast biopsies  . carotid cavernous fistula     to block  . COLONOSCOPY    . EYE SURGERY Bilateral    cataracts  . INTRAMEDULLARY (IM) NAIL INTERTROCHANTERIC Left 11/29/2015   Procedure: LEFT HIP   NAIL;  Surgeon: Renette [place], MD;  Location: [facility];  Service: Orthopedics;  Laterality: Left;  . JOINT REPLACEMENT Left 09/2009   knee  . JOINT REPLACEMENT Right 01/02/2014  . TONSILLECTOMY    . TOTAL KNEE ARTHROPLASTY Right 01/02/2014   Dr Ronnie Derby  . TOTAL KNEE ARTHROPLASTY Right 01/02/2014   Procedure: TOTAL KNEE ARTHROPLASTY;  Surgeon: [name], MD;  Location: [facility];  Service: Orthopedics;  Laterality: Right;  . TUBAL LIGATION      OB History    No data available  Home Medications    Prior to Admission medications   Medication Sig Start Date End Date Taking? Authorizing Provider  acetaminophen (TYLENOL) 500 MG tablet Take 1,000 mg by mouth every 6 (six) hours as needed for mild pain, moderate pain or headache.    Yes Historical Provider, MD  acidophilus (RISAQUAD) CAPS capsule Take 1 capsule by mouth daily.   Yes Historical Provider, MD  aspirin EC 81 MG tablet Take 81 mg by mouth at bedtime.    Yes Historical Provider, MD  citalopram (CELEXA) 20 MG tablet Take 1 tablet (20 mg total) by mouth at bedtime. 02/24/14  Yes [name], MD  gabapentin (NEURONTIN) 100 MG  capsule Take 100-300 mg by mouth 3 (three) times daily. Pt takes one in the morning, one at noon, and three at bedtime.   Yes Historical Provider, MD  hydrOXYzine (ATARAX/VISTARIL) 25 MG tablet Take 50 mg by mouth at bedtime as needed for anxiety (and/or sleep).    Yes Historical Provider, MD  loratadine (CLARITIN) 10 MG tablet Take 10 mg by mouth daily as needed for allergies.    Yes Historical Provider, MD  methocarbamol (ROBAXIN) 500 MG tablet Take 250 mg by mouth 2 (two) times daily as needed for muscle spasms.   Yes Historical Provider, MD  ondansetron (ZOFRAN-ODT) 4 MG disintegrating tablet Take 4 mg by mouth every 6 (six) hours as needed for nausea or vomiting.    Yes Historical Provider, MD  oxyCODONE (OXY IR/ROXICODONE) 5 MG immediate release tablet Take 1 tablet (5 mg total) by mouth every 4 (four) hours as needed for severe pain. 01/26/16  Yes [place], DO  pramipexole (MIRAPEX) 0.125 MG tablet Take 0.375 mg by mouth 2 (two) times daily as needed (for restless leg syndrome).    Yes Historical Provider, MD  spironolactone (ALDACTONE) 25 MG tablet Take 25 mg by mouth daily.   Yes Historical Provider, MD  traMADol (ULTRAM) 50 MG tablet Take 1 tablet (50 mg total) by mouth every 6 (six) hours as needed for moderate pain. 01/26/16  Yes [place], DO  traZODone (DESYREL) 50 MG tablet Take 50 mg by mouth at bedtime as needed for sleep.   Yes Historical Provider, MD    Family History Family History  Problem Relation Age of Onset  . Hypertension Other   . Cancer Mother   . Cancer Father     Social History Social History  Substance Use Topics  . Smoking status: Former Research scientist (life sciences)  . Smokeless tobacco: Never Used     Comment: quit smoking many years ago   . Alcohol use No     Comment: unable to get alcohol per family not since September     Allergies   Sulfa antibiotics; Ciprofloxacin; and Motrin [ibuprofen]   Review of Systems Review of Systems  Constitutional:  Positive for fever (unknown how high or what temperateure today).  Respiratory: Negative for shortness of breath.   Cardiovascular: Positive for leg swelling. Negative for chest pain.  Skin: Positive for color change. Negative for wound.  All other systems reviewed and are negative.    Physical Exam Updated Vital Signs BP 140/87 (BP Location: Right Arm)   Pulse 97   Temp 98 F (36.7 C) (Oral)   Resp 18   Ht 5\' 6"  (1.676 m)   Wt 210 lb (95.3 kg)   SpO2 94%   BMI 33.89 kg/m   Physical Exam  Constitutional: She is oriented to person, place, and time. She appears  well-developed and well-nourished.  HENT:  Head: Normocephalic and atraumatic.  Right Ear: External ear normal.  Left Ear: External ear normal.  Nose: Nose normal.  Eyes: Right eye exhibits no discharge. Left eye exhibits no discharge.  Cardiovascular: Normal rate, regular rhythm, normal heart sounds and intact distal pulses.   Pulses:      Dorsalis pedis pulses are 2+ on the right side, and 2+ on the left side.  Pulmonary/Chest: Effort normal. She has rales (bibasilar).  Abdominal: Soft. There is no tenderness.  Musculoskeletal:  Diffuse symmetric edema to RLE. Erythema with mild warmth. Tender to palpation. LLE with no minimal swelling.  Neurological: She is alert and oriented to person, place, and time.  Skin: Skin is warm and dry.  Nursing note and vitals reviewed.    ED Treatments / Results  Labs (all labs ordered are listed, but only abnormal results are displayed) Labs Reviewed  COMPREHENSIVE METABOLIC PANEL - Abnormal; Notable for the following:       Result Value   Potassium 3.3 (*)    Chloride 100 (*)    CO2 33 (*)    Glucose, Bld 102 (*)    Calcium 8.4 (*)    Total Protein 6.2 (*)    Albumin 3.2 (*)    ALT 12 (*)    All other components within normal limits  MRSA PCR SCREENING  CBC WITH DIFFERENTIAL/PLATELET  BRAIN NATRIURETIC PEPTIDE  TROPONIN I  MAGNESIUM  PHOSPHORUS  TSH    COMPREHENSIVE METABOLIC PANEL  CBC  HEMOGLOBIN A1C  I-STAT CG4 LACTIC ACID, ED    EKG  EKG Interpretation  Date/Time:  Monday February 25 2016 18:42:31 EST Ventricular Rate:  91 PR Interval:    QRS Duration: 143 QT Interval:  380 QTC Calculation: 468 R Axis:   -26 Text Interpretation:  Normal sinus rhythm Right bundle branch block no significant change since Jan 22 2016 Confirmed by Regenia Skeeter MD, Twanisha Foulk [phone]) on 02/25/2016 6:48:03 PM       Radiology Dg Chest 2 View  Result Date: 02/25/2016 CLINICAL DATA:  Hypoxia.  Right lower leg edema. EXAM: CHEST  2 VIEW COMPARISON:  01/22/2016 FINDINGS: Cardiac silhouette is normal in size. No mediastinal or hilar masses. No evidence of adenopathy. Clear lungs.  No pleural effusion.  No pneumothorax. Skeletal structures are demineralized but grossly intact. IMPRESSION: No active cardiopulmonary disease. Electronically Signed   By: Lajean Manes M.D.   On: 02/25/2016 19:28   Dg Tibia/fibula Right  Result Date: 02/25/2016 CLINICAL DATA:  Hypoxia, edema and erythema of right lower leg EXAM: RIGHT TIBIA AND FIBULA - 2 VIEW COMPARISON:  03/13/2014 FINDINGS: The patient is status post right knee replacement with similar hardware alignment. There is no fracture or dislocation. Marked subcutaneous edema. No bony erosive change or periostitis. No large joint effusion. IMPRESSION: 1. No acute osseous abnormality status post right knee replacement 2. Moderate to marked subcutaneous edema Electronically Signed   By: Donavan Foil M.D.   On: 02/25/2016 19:29   Ct Angio Chest Pe W/cm &/or Wo Cm  Result Date: 02/25/2016 CLINICAL DATA:  Increased redness of the right lower extremity febrile EXAM: CT ANGIOGRAPHY CHEST WITH CONTRAST TECHNIQUE: Multidetector CT imaging of the chest was performed using the standard protocol during bolus administration of intravenous contrast. Multiplanar CT image reconstructions and MIPs were obtained to evaluate the vascular  anatomy. CONTRAST:  100 mL Isovue 370 intravenous COMPARISON:  Chest x-ray 02/25/2016, CT scan 02/21/2014 FINDINGS: Cardiovascular: No evidence for filling defect  within the central or main segmental pulmonary arteries to suggest the presence of an acute embolus. Thoracic aorta is of normal caliber. No dissection is seen. There is mild cardiomegaly. Mild aortic valvular calcification. No pericardial effusion. Mediastinum/Nodes: Thyroid gland within normal limits. Trachea and mainstem bronchi are unremarkable. Esophagus is slightly tortuous but otherwise unremarkable. No significantly enlarged mediastinal or hilar nodes. No axillary adenopathy. Lungs/Pleura: No acute consolidation, pleural effusion or pneumothorax. Calcification in the apical portion of right upper lobe with slight stellate density unchanged. Mild linear atelectasis in the lingula. Upper Abdomen: Numerous hypodense subcentimeter lesions in the liver, larger lesions in the anterior left hepatic lobe and the posterior dome of the liver are grossly unchanged. Remaining lesions are too small to further characterize. No acute abnormality identified in the upper abdomen. Musculoskeletal: Degenerative changes of the spine. No suspicious bone lesions. Review of the MIP images confirms the above findings. IMPRESSION: 1. No evidence for pulmonary embolus or aortic dissection. 2. Mild cardiomegaly 3. No acute infiltrate, pleural effusion or pneumothorax. 4. Multiple hypodense liver lesions, larger lesions appear grossly unchanged, remaining lesions are too small to further characterize. Electronically Signed   By: Donavan Foil M.D.   On: 02/25/2016 21:56    Procedures Procedures (including critical care time)  Medications Ordered in ED Medications  sodium chloride 0.9 % injection (not administered)  iopamidol (ISOVUE-370) 76 % injection (not administered)  gabapentin (NEURONTIN) capsule 300 mg (300 mg Oral Given 02/26/16 0107)  traZODone (DESYREL)  tablet 50 mg (50 mg Oral Given 02/26/16 0108)  aspirin EC tablet 81 mg (81 mg Oral Given 02/26/16 0108)  methocarbamol (ROBAXIN) tablet 250 mg (not administered)  oxyCODONE (Oxy IR/ROXICODONE) immediate release tablet 5 mg (not administered)  traMADol (ULTRAM) tablet 50 mg (not administered)  pramipexole (MIRAPEX) tablet 0.375 mg (not administered)  spironolactone (ALDACTONE) tablet 25 mg (not administered)  citalopram (CELEXA) tablet 20 mg (20 mg Oral Given 02/26/16 0107)  loratadine (CLARITIN) tablet 10 mg (not administered)  acetaminophen (TYLENOL) tablet 650 mg (not administered)    Or  acetaminophen (TYLENOL) suppository 650 mg (not administered)  HYDROcodone-acetaminophen (NORCO/VICODIN) 5-325 MG per tablet 1-2 tablet (2 tablets Oral Given 02/26/16 0107)  ondansetron (ZOFRAN) tablet 4 mg (not administered)    Or  ondansetron (ZOFRAN) injection 4 mg (not administered)  cefTRIAXone (ROCEPHIN) 1 g in dextrose 5 % 50 mL IVPB (1 g Intravenous Given 02/26/16 0108)  enoxaparin (LOVENOX) injection 40 mg (40 mg Subcutaneous Given 02/26/16 0107)  sodium chloride flush (NS) 0.9 % injection 3 mL (not administered)  sodium chloride flush (NS) 0.9 % injection 3 mL (not administered)  0.9 %  sodium chloride infusion (not administered)  thiamine (VITAMIN B-1) tablet 100 mg (not administered)  folic acid injection 1 mg (not administered)  ipratropium (ATROVENT) nebulizer solution 0.5 mg (not administered)  albuterol (PROVENTIL) (2.5 MG/3ML) 0.083% nebulizer solution 2.5 mg (not administered)  guaiFENesin (MUCINEX) 12 hr tablet 600 mg (600 mg Oral Given 02/26/16 0108)  gabapentin (NEURONTIN) capsule 100 mg (not administered)  HYDROmorphone (DILAUDID) injection 1 mg (1 mg Intravenous Given 02/25/16 1851)  ceFAZolin (ANCEF) IVPB 1 g/50 mL premix (0 g Intravenous Stopped 02/25/16 2112)  potassium chloride SA (K-DUR,KLOR-CON) CR tablet 40 mEq (40 mEq Oral Given 02/25/16 2230)  iopamidol (ISOVUE-370) 76  % injection 100 mL (100 mLs Intravenous Contrast Given 02/25/16 2130)  HYDROmorphone (DILAUDID) injection 1 mg (1 mg Intravenous Given 02/25/16 2234)  potassium chloride SA (K-DUR,KLOR-CON) CR tablet 40 mEq (40 mEq Oral Given  02/26/16 0107)     Initial Impression / Assessment and Plan / ED Course  I have reviewed the triage vital signs and the nursing notes.  Pertinent labs & imaging results that were available during my care of the patient were reviewed by me and considered in my medical decision making (see chart for details).  Clinical Course as of Feb 26 155  Mon Feb 25, 2016  [id number] Given the unilateral swelling, will get right lower extremity duplex to rule out DVT. It seems like she's had some swelling in this leg in the past, even her new hypoxia will also get BNP and chest x-ray. She is not short of breath. Rectal temperature. If all of these are negative, concern for cellulitis. However I doubt severe deep space infection  [SG]  2037 Dr. Roel Cluck to admit. She would like CTA given the hypoxia in case she had a dvt that moved. Will place orders when she arrives  [SG]    Clinical Course User Index [SG] Sherwood Gambler, MD    Patient's asymmetric redness/swelling is likely cellulitis. She has never had MRSA to her knowledge. NV intact. CTA negative, no clear etiology for mild hypoxia (high 80s on room air but asymptomatic). Admit to hospitalist.  Final Clinical Impressions(s) / ED Diagnoses   Final diagnoses:  Cellulitis of right leg    New Prescriptions Current Discharge Medication List       Sherwood Gambler, MD 02/26/16 [phone]

## 2016-02-25 NOTE — ED Notes (Signed)
Patient transported to X-ray 

## 2016-02-25 NOTE — Progress Notes (Signed)
*  Preliminary Results* Right lower extremity venous duplex completed. Right lower extremity is negative for deep vein thrombosis. There is no evidence of right Baker's cyst.  02/25/2016 7:27 PM  Maudry Mayhew, BS, RVT, RDCS, RDMS

## 2016-02-25 NOTE — ED Triage Notes (Signed)
Per EMS - Patient having increased redness to the right lower extremity. @ right pedal pulse. Patient has also been febrile at home.

## 2016-02-25 NOTE — Progress Notes (Signed)
EDCM spoke to patient and her daughter at bedside.  Patient reports she lives at home alone.  Patient wearing oxygen in the ED.  Patient does not wear oxygen at home.  Patient has a walker, elevated toilet seat and shower bench at home.  Patient confirms she is receiving home health services with PT and OT with [facility].  She reports OT has not come to see her yet.  Patient's pcp listed as Justin Mend.  [facility] asked patient if there was anything else she needed at home?  Patient reports a wheelchair might be nice but reports it probably won't fit down the hall.  Patient requesting pain medicine.  EDCM made EDRN aware.  No further EDCM needs at this time.

## 2016-02-26 ENCOUNTER — Inpatient Hospital Stay (HOSPITAL_COMMUNITY): Payer: Medicare Other

## 2016-02-26 DIAGNOSIS — I517 Cardiomegaly: Secondary | ICD-10-CM

## 2016-02-26 LAB — ECHOCARDIOGRAM COMPLETE
HEIGHTINCHES: 66 in
WEIGHTICAEL: 3360 [oz_av]

## 2016-02-26 LAB — CBC
HEMATOCRIT: 39 % (ref 36.0–46.0)
HEMOGLOBIN: 13.1 g/dL (ref 12.0–15.0)
MCH: 29.4 pg (ref 26.0–34.0)
MCHC: 33.6 g/dL (ref 30.0–36.0)
MCV: 87.6 fL (ref 78.0–100.0)
Platelets: 147 10*3/uL — ABNORMAL LOW (ref 150–400)
RBC: 4.45 MIL/uL (ref 3.87–5.11)
RDW: 13 % (ref 11.5–15.5)
WBC: 7.7 10*3/uL (ref 4.0–10.5)

## 2016-02-26 LAB — COMPREHENSIVE METABOLIC PANEL
ALK PHOS: 107 U/L (ref 38–126)
ALT: 11 U/L — ABNORMAL LOW (ref 14–54)
ANION GAP: 5 (ref 5–15)
AST: 18 U/L (ref 15–41)
Albumin: 2.9 g/dL — ABNORMAL LOW (ref 3.5–5.0)
BILIRUBIN TOTAL: 0.5 mg/dL (ref 0.3–1.2)
BUN: 7 mg/dL (ref 6–20)
CALCIUM: 8.4 mg/dL — AB (ref 8.9–10.3)
CO2: 33 mmol/L — ABNORMAL HIGH (ref 22–32)
Chloride: 105 mmol/L (ref 101–111)
Creatinine, Ser: 0.79 mg/dL (ref 0.44–1.00)
GFR calc Af Amer: >60 mL/min (ref >60)
GLUCOSE: 96 mg/dL (ref 65–99)
POTASSIUM: 3.9 mmol/L (ref 3.5–5.1)
Sodium: 143 mmol/L (ref 135–145)
TOTAL PROTEIN: 5.8 g/dL — AB (ref 6.5–8.1)

## 2016-02-26 LAB — PHOSPHORUS: PHOSPHORUS: 3.9 mg/dL (ref 2.5–4.6)

## 2016-02-26 LAB — TSH: TSH: 1.622 u[IU]/mL (ref 0.350–4.500)

## 2016-02-26 LAB — MAGNESIUM: MAGNESIUM: 2 mg/dL (ref 1.7–2.4)

## 2016-02-26 LAB — MRSA PCR SCREENING: MRSA BY PCR: NEGATIVE

## 2016-02-26 MED ORDER — CITALOPRAM HYDROBROMIDE 20 MG PO TABS
20.0000 mg | ORAL_TABLET | Freq: Every day | ORAL | Status: DC
  Administered 2016-02-26 – 2016-02-27 (×3): 20 mg via ORAL
  Filled 2016-02-26 (×3): qty 1

## 2016-02-26 MED ORDER — IPRATROPIUM-ALBUTEROL 0.5-2.5 (3) MG/3ML IN SOLN
3.0000 mL | Freq: Four times a day (QID) | RESPIRATORY_TRACT | Status: DC
  Administered 2016-02-26: 3 mL via RESPIRATORY_TRACT
  Filled 2016-02-26: qty 3

## 2016-02-26 MED ORDER — ONDANSETRON HCL 4 MG/2ML IJ SOLN
4.0000 mg | Freq: Four times a day (QID) | INTRAMUSCULAR | Status: DC | PRN
  Administered 2016-02-26 – 2016-02-27 (×3): 4 mg via INTRAVENOUS
  Filled 2016-02-26 (×3): qty 2

## 2016-02-26 MED ORDER — GABAPENTIN 100 MG PO CAPS
100.0000 mg | ORAL_CAPSULE | Freq: Two times a day (BID) | ORAL | Status: DC
  Administered 2016-02-26 – 2016-02-28 (×6): 100 mg via ORAL
  Filled 2016-02-26 (×6): qty 1

## 2016-02-26 MED ORDER — FOLIC ACID 1 MG PO TABS
1.0000 mg | ORAL_TABLET | Freq: Every day | ORAL | Status: DC
  Administered 2016-02-26 – 2016-02-28 (×3): 1 mg via ORAL
  Filled 2016-02-26 (×2): qty 1

## 2016-02-26 MED ORDER — GABAPENTIN 100 MG PO CAPS
300.0000 mg | ORAL_CAPSULE | Freq: Every day | ORAL | Status: DC
  Administered 2016-02-26 – 2016-02-27 (×3): 300 mg via ORAL
  Filled 2016-02-26 (×3): qty 3

## 2016-02-26 MED ORDER — ACETAMINOPHEN 325 MG PO TABS: 650.0000 mg | ORAL_TABLET | Freq: Four times a day (QID) | ORAL | Status: DC | PRN

## 2016-02-26 MED ORDER — FOLIC ACID 5 MG/ML IJ SOLN
1.0000 mg | Freq: Every day | INTRAMUSCULAR | Status: DC
  Filled 2016-02-26: qty 0.2

## 2016-02-26 MED ORDER — OXYCODONE HCL 5 MG PO TABS
5.0000 mg | ORAL_TABLET | ORAL | Status: DC | PRN
  Administered 2016-02-26 – 2016-02-28 (×6): 5 mg via ORAL
  Filled 2016-02-26 (×6): qty 1

## 2016-02-26 MED ORDER — SODIUM CHLORIDE 0.9% FLUSH
3.0000 mL | Freq: Two times a day (BID) | INTRAVENOUS | Status: DC
  Administered 2016-02-26 – 2016-02-27 (×3): 3 mL via INTRAVENOUS

## 2016-02-26 MED ORDER — SODIUM CHLORIDE 0.9% FLUSH: 3.0000 mL | INTRAVENOUS | Status: DC | PRN

## 2016-02-26 MED ORDER — ONDANSETRON HCL 4 MG PO TABS
4.0000 mg | ORAL_TABLET | Freq: Four times a day (QID) | ORAL | Status: DC | PRN
  Administered 2016-02-27: 4 mg via ORAL
  Filled 2016-02-26: qty 1

## 2016-02-26 MED ORDER — SPIRONOLACTONE 25 MG PO TABS
25.0000 mg | ORAL_TABLET | Freq: Every day | ORAL | Status: DC
  Administered 2016-02-26 – 2016-02-28 (×3): 25 mg via ORAL
  Filled 2016-02-26 (×3): qty 1

## 2016-02-26 MED ORDER — POTASSIUM CHLORIDE CRYS ER 20 MEQ PO TBCR
40.0000 meq | EXTENDED_RELEASE_TABLET | Freq: Once | ORAL | Status: AC
  Administered 2016-02-26: 40 meq via ORAL
  Filled 2016-02-26: qty 2

## 2016-02-26 MED ORDER — IPRATROPIUM BROMIDE 0.02 % IN SOLN: 0.5000 mg | Freq: Four times a day (QID) | RESPIRATORY_TRACT | Status: DC

## 2016-02-26 MED ORDER — IPRATROPIUM-ALBUTEROL 0.5-2.5 (3) MG/3ML IN SOLN
3.0000 mL | Freq: Two times a day (BID) | RESPIRATORY_TRACT | Status: DC
  Administered 2016-02-26: 3 mL via RESPIRATORY_TRACT
  Filled 2016-02-26: qty 3

## 2016-02-26 MED ORDER — HYDROCODONE-ACETAMINOPHEN 5-325 MG PO TABS
1.0000 | ORAL_TABLET | ORAL | Status: DC | PRN
  Administered 2016-02-26 (×2): 1 via ORAL
  Administered 2016-02-26 – 2016-02-28 (×5): 2 via ORAL
  Filled 2016-02-26 (×3): qty 2
  Filled 2016-02-26: qty 1
  Filled 2016-02-26 (×2): qty 2
  Filled 2016-02-26: qty 1
  Filled 2016-02-26: qty 2

## 2016-02-26 MED ORDER — TRAMADOL HCL 50 MG PO TABS: 50.0000 mg | ORAL_TABLET | Freq: Four times a day (QID) | ORAL | Status: DC | PRN

## 2016-02-26 MED ORDER — ASPIRIN EC 81 MG PO TBEC
81.0000 mg | DELAYED_RELEASE_TABLET | Freq: Every day | ORAL | Status: DC
  Administered 2016-02-26 – 2016-02-27 (×3): 81 mg via ORAL
  Filled 2016-02-26 (×3): qty 1

## 2016-02-26 MED ORDER — METHOCARBAMOL 500 MG PO TABS
250.0000 mg | ORAL_TABLET | Freq: Two times a day (BID) | ORAL | Status: DC | PRN
  Administered 2016-02-26 – 2016-02-27 (×3): 250 mg via ORAL
  Filled 2016-02-26 (×4): qty 1

## 2016-02-26 MED ORDER — LORATADINE 10 MG PO TABS: 10.0000 mg | ORAL_TABLET | Freq: Every day | ORAL | Status: DC | PRN

## 2016-02-26 MED ORDER — TRAZODONE HCL 50 MG PO TABS
50.0000 mg | ORAL_TABLET | Freq: Every evening | ORAL | Status: DC | PRN
  Administered 2016-02-26 – 2016-02-27 (×3): 50 mg via ORAL
  Filled 2016-02-26 (×3): qty 1

## 2016-02-26 MED ORDER — IPRATROPIUM-ALBUTEROL 0.5-2.5 (3) MG/3ML IN SOLN: 3.0000 mL | RESPIRATORY_TRACT | Status: DC | PRN

## 2016-02-26 MED ORDER — ACETAMINOPHEN 650 MG RE SUPP: 650.0000 mg | Freq: Four times a day (QID) | RECTAL | Status: DC | PRN

## 2016-02-26 MED ORDER — ALBUTEROL SULFATE (2.5 MG/3ML) 0.083% IN NEBU: 2.5000 mg | INHALATION_SOLUTION | RESPIRATORY_TRACT | Status: DC | PRN

## 2016-02-26 MED ORDER — GUAIFENESIN ER 600 MG PO TB12
600.0000 mg | ORAL_TABLET | Freq: Two times a day (BID) | ORAL | Status: DC
  Administered 2016-02-26 – 2016-02-28 (×6): 600 mg via ORAL
  Filled 2016-02-26 (×6): qty 1

## 2016-02-26 MED ORDER — SODIUM CHLORIDE 0.9 % IV SOLN: 250.0000 mL | INTRAVENOUS | Status: DC | PRN

## 2016-02-26 MED ORDER — PRAMIPEXOLE DIHYDROCHLORIDE 0.25 MG PO TABS
0.3750 mg | ORAL_TABLET | Freq: Two times a day (BID) | ORAL | Status: DC | PRN
  Filled 2016-02-26: qty 1

## 2016-02-26 MED ORDER — VITAMIN B-1 100 MG PO TABS
100.0000 mg | ORAL_TABLET | Freq: Every day | ORAL | Status: DC
  Administered 2016-02-26 – 2016-02-28 (×3): 100 mg via ORAL
  Filled 2016-02-26 (×3): qty 1

## 2016-02-26 MED ORDER — ENOXAPARIN SODIUM 40 MG/0.4ML ~~LOC~~ SOLN
40.0000 mg | Freq: Every day | SUBCUTANEOUS | Status: DC
  Administered 2016-02-26 – 2016-02-27 (×3): 40 mg via SUBCUTANEOUS
  Filled 2016-02-26 (×3): qty 0.4

## 2016-02-26 MED ORDER — DEXTROSE 5 % IV SOLN
1.0000 g | Freq: Every day | INTRAVENOUS | Status: DC
  Administered 2016-02-26 – 2016-02-28 (×4): 1 g via INTRAVENOUS
  Filled 2016-02-26 (×4): qty 10

## 2016-02-26 NOTE — Evaluation (Signed)
Physical Therapy Evaluation Patient Details Name: SAKIA BRIZUELA MRN: [id number] DOB: 1940-06-03 Today's Date: 02/26/2016   History of Present Illness  75 yo female admitted with esential hypertension, R LE cellulitis. Hx of COPD, ETOH abuse, chronic pain, R TKA 2015, L hip IM nail 11/2015  Clinical Impression  On eval, pt required Mod assist for mobility. She declined transfer and/or ambulation however she did agree to stand and take a few steps along the side of bed. Increased time for all mobility tasks. Pt c/o bil LE pain with activity-made RN aware. Will follow and progress activity as tolerated. At this time, recommendation is for ST rehab at SNF if pt is agreeable. If she declines SNF placement, recommend HHPT and 24 hour supervision/assist.     Follow Up Recommendations SNF    Equipment Recommendations  None recommended by PT    Recommendations for Other Services OT consult     Precautions / Restrictions Precautions Precautions: Fall Restrictions Weight Bearing Restrictions: No      Mobility  Bed Mobility Overal bed mobility: Needs Assistance Bed Mobility: Supine to Sit     Supine to sit: Mod assist;HOB elevated     General bed mobility comments: Assist for bil LEs. Increased time. Pt relied on bedrail as well.   Transfers Overall transfer level: Needs assistance Equipment used: Rolling walker (2 wheeled) Transfers: Sit to/from Stand Sit to Stand: Mod assist;From elevated surface         General transfer comment: Assist to rise, stabilize, control descent. VCs hand placement. Had to block feet due to floor and shoes being slippery. Pt did not want to wear socks.   Ambulation/Gait Ambulation/Gait assistance: Min assist   Assistive device: Rolling walker (2 wheeled) Gait Pattern/deviations: Step-to pattern     General Gait Details: side steps along side of bed with RW. Assist to stabilize pt. Pt declined transfer and/or ambulation on today.  Stairs            Wheelchair Mobility    Modified Rankin (Stroke Patients Only)       Balance Overall balance assessment: Needs assistance;History of Falls         Standing balance support: Bilateral upper extremity supported Standing balance-Leahy Scale: Poor                               Pertinent Vitals/Pain Pain Assessment: Faces Faces Pain Scale: Hurts even more Pain Location: R and L LEs Pain Descriptors / Indicators: Sore;Aching Pain Intervention(s): Limited activity within patient's tolerance;Repositioned    Home Living Family/patient expects to be discharged to:: Private residence Living Arrangements: Alone   Type of Home: Apartment Home Access: Level entry     Home Layout: One level Home Equipment: Environmental consultant - 2 wheels;Bedside commode;Shower seat      Prior Function Level of Independence: Needs assistance   Gait / Transfers Assistance Needed: using walker for ambulation. Pt states she could make it to her bathroom unassisted.   ADL's / Homemaking Assistance Needed: aide comes out to help around the house and         Hand Dominance        Extremity/Trunk Assessment   Upper Extremity Assessment: Defer to OT evaluation           Lower Extremity Assessment: Generalized weakness (redness and swelling R lower leg)      Cervical / Trunk Assessment: Normal  Communication   Communication: HOH  Cognition  Arousal/Alertness: Awake/alert Behavior During Therapy: WFL for tasks assessed/performed Overall Cognitive Status: Within Functional Limits for tasks assessed                      General Comments      Exercises     Assessment/Plan    PT Assessment Patient needs continued PT services  PT Problem List Decreased strength;Decreased mobility;Decreased activity tolerance;Decreased balance;Decreased knowledge of use of DME;Pain          PT Treatment Interventions DME instruction;Therapeutic exercise;Gait  training;Patient/family education;Therapeutic activities;Functional mobility training;Balance training    PT Goals (Current goals can be found in the Care Plan section)  Acute Rehab PT Goals Patient Stated Goal: less pain.  PT Goal Formulation: With patient Time For Goal Achievement: 03/11/16 Potential to Achieve Goals: Good    Frequency Min 3X/week   Barriers to discharge        Co-evaluation               End of Session   Activity Tolerance: Patient limited by fatigue;Patient limited by pain Patient left: in bed;with call bell/phone within reach;with bed alarm set           Time: [place]:[id number] PT Time Calculation (min) (ACUTE ONLY): 20 min   Charges:   PT Evaluation $PT Eval Low Complexity: 1 Procedure     PT G Codes:        Weston Anna, MPT Pager: [phone]

## 2016-02-26 NOTE — Progress Notes (Signed)
  Echocardiogram 2D Echocardiogram has been performed.  Tresa Res 02/26/2016, 10:51 AM

## 2016-02-26 NOTE — Clinical Social Work Note (Signed)
Clinical Social Work Assessment  Patient Details  Name: Denise Kelly MRN: [id number] Date of Birth: [birth year]  Date of referral:  02/26/16               Reason for consult:  Facility Placement                Permission sought to share information with:  Facility Sport and exercise psychologist, Family Supports Permission granted to share information::     Name::     Dalia Heading  Agency::   SNF  Relationship::   Daughter-POA  Contact Information:   [phone]  Housing/Transportation Living arrangements for the past 2 months:  Single Family Home Source of Information:  Patient Patient Interpreter Needed:  None Criminal Activity/Legal Involvement Pertinent to Current Situation/Hospitalization:  No Significant Relationships:  Adult Children Lives with:  Self Do you feel safe going back to the place where you live?  Yes Need for family participation in patient care:  Yes (Comment)  Care giving concerns:  Patient daughter expressed concern about patient caring for self at home.    Social Worker assessment / plan:  LCSWA spoke to patient daughter also POA. She reports the patient likes her independence and will probably feel she can manage at home. Daughter reports she plans to meet with the patient this evening to discuss pt disposition. Daughter reports the patient has been to SNF before and did really well with physical therapy.  Plan: LCSWA to assist with disposition to SNF if pt. Is agreeable.   Employment status:  Retired Forensic scientist:  Medicare PT Recommendations:  [place] / Referral to community resources:  [place]  Patient/Family's Response to care:  Patient is not yet agreeable to SNF, daughter wants to talk with patient about receiving therapy at facility vs. Home.   Patient/Family's Understanding of and Emotional Response to Diagnosis, Current Treatment, and Prognosis:  Daughter is knowledgeable of patient care and  current treatment.   Emotional Assessment Appearance:  Appears older than stated age Attitude/Demeanor/Rapport:    Affect (typically observed):    Orientation:  Oriented to Self Alcohol / Substance use:  Not Applicable Psych involvement (Current and /or in the community):  No (Comment)  Discharge Needs  Concerns to be addressed:  Care Coordination, Discharge Planning Concerns Readmission within the last 30 days:  No Current discharge risk:  Dependent with Mobility Barriers to Discharge:  Continued Medical Work up   [employer], LCSW 02/26/2016, 4:05 PM

## 2016-02-26 NOTE — Progress Notes (Signed)
PROGRESS NOTE    Denise Kelly  [id number]  DOB: [birth year]  DOA: 02/25/2016 PCP: Jonathon Bellows, MD Outpatient Specialists:  Hospital course: Presented with 3 day history of progressive right lower extremity redness and swelling also reports subjective fevers at home. The pain has become significant today and she presented to emergency department she hasn't had any associated chest pain and dyspnea she took an oxycodone prior to her ultimate she department did not help her pain but on arrival she was noted to be hypoxic requiring 2 L of oxygen. She has bilateral edema but much worse in the  Right. She denies cough occasionaly short of breath. Mobility limited after knee surgery family states patient often sits down with the right leg tingling and that may have explained some increased swelling is been having trouble taking care of herself and taking showers. Family is concerned the patient is on same at home   Assessment & Plan:   1. Cellulitis R LE - slowly improving on IV ceftriaxone, still needs IV antibiotics at this point, would not improve with oral antibiotics, cultures pending, will follow, will order to elevate extremity while in bed.  Doppler study neg for DVT.  2. Hypokalemia - improved with oral replacement, follow BMP, Magnesium level OK 3. Chronic back pain - stable on current home medications 4. Hypoxia - likely from COPD, improved with supplemental oxygen, weaning to room air as tolerated.  5. Essential Hypertension - stable on current home medications.  Echo pending.   6. 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   7. Severe debility - Pt is essentially bed bound at home per daughter, will get PT/OT evaluation, likely will need rehab SNF placement.   DVT prophylaxis: lovenox Code Status: full Family Communication: spoke with daughter Disposition Plan: PT evaluation  Consultants:  PT/OT  Subjective: Pt  reports that she feels nauseated.     Objective: Vitals:   02/25/16 2351 02/26/16 0027 02/26/16 0401 02/26/16 0745  BP: 129/76 140/87 138/87   Pulse: 94 97 88   Resp: 16 18 18    Temp: 98 F (36.7 C) 98 F (36.7 C)    TempSrc: Oral Oral    SpO2: 93% 94% 95% 95%  Weight:      Height:        Intake/Output Summary (Last 24 hours) at 02/26/16 1030 Last data filed at 02/26/16 0900  Gross per 24 hour  Intake               50 ml  Output              300 ml  Net             -250 ml   Filed Weights   02/25/16 1721  Weight: 95.3 kg (210 lb)    Exam:  General exam: aeake, alert, no distress cooperative Respiratory system: Clear. No increased work of breathing. Cardiovascular system: S1 & S2 heard, RRR. No JVD, murmurs, gallops, clicks or pedal edema. Gastrointestinal system: Abdomen is nondistended, soft and nontender. Normal bowel sounds heard. Central nervous system: Alert and oriented. No focal neurological deficits. Extremities: 2+ edema RLE pitting, cellulitis seen RLE with heat and open areas of skin, no drainage seen.      Data Reviewed: Basic Metabolic Panel:  Recent Labs Lab 02/25/16 1839 02/26/16 0638  NA 140 143  K 3.3* 3.9  CL 100* 105  CO2 33* 33*  GLUCOSE 102* 96  BUN 7 7  CREATININE 0.67 0.79  CALCIUM 8.4* 8.4*  MG  --  2.0  PHOS  --  3.9   Liver Function Tests:  Recent Labs Lab 02/25/16 1839 02/26/16 0638  AST 22 18  ALT 12* 11*  ALKPHOS 113 107  BILITOT 0.9 0.5  PROT 6.2* 5.8*  ALBUMIN 3.2* 2.9*   No results for input(s): LIPASE, AMYLASE in the last 168 hours. No results for input(s): AMMONIA in the last 168 hours. CBC:  Recent Labs Lab 02/25/16 1839 02/26/16 0638  WBC 8.0 7.7  NEUTROABS 3.9  --   HGB 13.7 13.1  HCT 41.6 39.0  MCV 89.3 87.6  PLT 156 147*   Cardiac Enzymes:  Recent Labs Lab 02/25/16 1839  TROPONINI <0.03   CBG (last 3)  No results for input(s): GLUCAP in the last 72 hours. Recent Results (from the  past 240 hour(s))  MRSA PCR Screening     Status: None   Collection Time: 02/26/16  1:18 AM  Result Value Ref Range Status   MRSA by PCR NEGATIVE NEGATIVE Final    Comment:        The GeneXpert MRSA Assay (FDA approved for NASAL specimens only), is one component of a comprehensive MRSA colonization surveillance program. It is not intended to diagnose MRSA infection nor to guide or monitor treatment for MRSA infections.      Studies: Dg Chest 2 View  Result Date: 02/25/2016 CLINICAL DATA:  Hypoxia.  Right lower leg edema. EXAM: CHEST  2 VIEW COMPARISON:  01/22/2016 FINDINGS: Cardiac silhouette is normal in size. No mediastinal or hilar masses. No evidence of adenopathy. Clear lungs.  No pleural effusion.  No pneumothorax. Skeletal structures are demineralized but grossly intact. IMPRESSION: No active cardiopulmonary disease. Electronically Signed   By: Lajean Manes M.D.   On: 02/25/2016 19:28   Dg Tibia/fibula Right  Result Date: 02/25/2016 CLINICAL DATA:  Hypoxia, edema and erythema of right lower leg EXAM: RIGHT TIBIA AND FIBULA - 2 VIEW COMPARISON:  03/13/2014 FINDINGS: The patient is status post right knee replacement with similar hardware alignment. There is no fracture or dislocation. Marked subcutaneous edema. No bony erosive change or periostitis. No large joint effusion. IMPRESSION: 1. No acute osseous abnormality status post right knee replacement 2. Moderate to marked subcutaneous edema Electronically Signed   By: Donavan Foil M.D.   On: 02/25/2016 19:29   Ct Angio Chest Pe W/cm &/or Wo Cm  Result Date: 02/25/2016 CLINICAL DATA:  Increased redness of the right lower extremity febrile EXAM: CT ANGIOGRAPHY CHEST WITH CONTRAST TECHNIQUE: Multidetector CT imaging of the chest was performed using the standard protocol during bolus administration of intravenous contrast. Multiplanar CT image reconstructions and MIPs were obtained to evaluate the vascular anatomy. CONTRAST:  100  mL Isovue 370 intravenous COMPARISON:  Chest x-ray 02/25/2016, CT scan 02/21/2014 FINDINGS: Cardiovascular: No evidence for filling defect within the central or main segmental pulmonary arteries to suggest the presence of an acute embolus. Thoracic aorta is of normal caliber. No dissection is seen. There is mild cardiomegaly. Mild aortic valvular calcification. No pericardial effusion. Mediastinum/Nodes: Thyroid gland within normal limits. Trachea and mainstem bronchi are unremarkable. Esophagus is slightly tortuous but otherwise unremarkable. No significantly enlarged mediastinal or hilar nodes. No axillary adenopathy. Lungs/Pleura: No acute consolidation, pleural effusion or pneumothorax. Calcification in the apical portion of right upper lobe with slight stellate density unchanged. Mild linear atelectasis in the lingula. Upper Abdomen: Numerous hypodense subcentimeter  lesions in the liver, larger lesions in the anterior left hepatic lobe and the posterior dome of the liver are grossly unchanged. Remaining lesions are too small to further characterize. No acute abnormality identified in the upper abdomen. Musculoskeletal: Degenerative changes of the spine. No suspicious bone lesions. Review of the MIP images confirms the above findings. IMPRESSION: 1. No evidence for pulmonary embolus or aortic dissection. 2. Mild cardiomegaly 3. No acute infiltrate, pleural effusion or pneumothorax. 4. Multiple hypodense liver lesions, larger lesions appear grossly unchanged, remaining lesions are too small to further characterize. Electronically Signed   By: Donavan Foil M.D.   On: 02/25/2016 21:56   US Abdomen Limited Ruq  Result Date: 02/26/2016 CLINICAL DATA:  Followup liver disease. EXAM: US ABDOMEN LIMITED - RIGHT UPPER QUADRANT COMPARISON:  10/31/2015 FINDINGS: Gallbladder: Persistent area of increased echogenicity along the gallbladder fundus. 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 This has  been present since MRI examination 06/29/2012 and is stable. No gallstones, wall thickening or pericholecystic fluid. Common bile duct: Diameter: 6.3 mm Liver: Mildly heterogeneous echogenicity but no worrisome lesions or intrahepatic biliary dilatation. A small cyst is noted in the left hepatic lobe which appears stable. IMPRESSION: Stable fundal gallbladder abnormality since MRI 2014. No gallstones or findings for acute cholecystitis. Normal caliber common bile duct. Heterogeneous hepatic echogenicity but no worrisome lesions. Stable cysts. Electronically Signed   By: Marijo Sanes M.D.   On: 02/26/2016 09:47   Scheduled Meds: . aspirin EC  81 mg Oral QHS  . cefTRIAXone (ROCEPHIN)  IV  1 g Intravenous QHS  . citalopram  20 mg Oral QHS  . enoxaparin (LOVENOX) injection  40 mg Subcutaneous QHS  . folic acid  1 mg Oral Daily  . gabapentin  100 mg Oral BID WC  . gabapentin  300 mg Oral QHS  . guaiFENesin  600 mg Oral BID  . ipratropium-albuterol  3 mL Nebulization BID  . sodium chloride flush  3 mL Intravenous Q12H  . spironolactone  25 mg Oral Daily  . thiamine  100 mg Oral Daily   Continuous Infusions:  Active Problems:   Essential hypertension, benign   Hypokalemia   Cellulitis   Chronic back pain   Hypoxia   Lesion of liver   Time spent:   Irwin Brakeman, MD, FAAFP [facility] Pager [phone]  If 7PM-7AM, please contact night-coverage [email or web address] Password [email or web address] 02/26/2016, 10:30 AM    LOS: 1 day

## 2016-02-27 DIAGNOSIS — G8929 Other chronic pain: Secondary | ICD-10-CM

## 2016-02-27 DIAGNOSIS — E876 Hypokalemia: Secondary | ICD-10-CM

## 2016-02-27 DIAGNOSIS — R300 Dysuria: Secondary | ICD-10-CM

## 2016-02-27 DIAGNOSIS — I10 Essential (primary) hypertension: Secondary | ICD-10-CM

## 2016-02-27 DIAGNOSIS — M545 Low back pain: Secondary | ICD-10-CM

## 2016-02-27 DIAGNOSIS — K769 Liver disease, unspecified: Secondary | ICD-10-CM

## 2016-02-27 DIAGNOSIS — L03115 Cellulitis of right lower limb: Principal | ICD-10-CM

## 2016-02-27 DIAGNOSIS — R0902 Hypoxemia: Secondary | ICD-10-CM

## 2016-02-27 LAB — HEMOGLOBIN A1C
HEMOGLOBIN A1C: 5.7 % — AB (ref 4.8–5.6)
MEAN PLASMA GLUCOSE: 117 mg/dL

## 2016-02-27 LAB — CBC WITH DIFFERENTIAL/PLATELET
BASOS PCT: 1 %
Basophils Absolute: 0 10*3/uL (ref 0.0–0.1)
EOS ABS: 0.3 10*3/uL (ref 0.0–0.7)
Eosinophils Relative: 5 %
HCT: 39.6 % (ref 36.0–46.0)
HEMOGLOBIN: 13.1 g/dL (ref 12.0–15.0)
LYMPHS ABS: 2.5 10*3/uL (ref 0.7–4.0)
Lymphocytes Relative: 42 %
MCH: 30 pg (ref 26.0–34.0)
MCHC: 33.1 g/dL (ref 30.0–36.0)
MCV: 90.8 fL (ref 78.0–100.0)
MONO ABS: 0.5 10*3/uL (ref 0.1–1.0)
MONOS PCT: 9 %
NEUTROS PCT: 43 %
Neutro Abs: 2.5 10*3/uL (ref 1.7–7.7)
Platelets: 139 10*3/uL — ABNORMAL LOW (ref 150–400)
RBC: 4.36 MIL/uL (ref 3.87–5.11)
RDW: 13.4 % (ref 11.5–15.5)
WBC: 5.9 10*3/uL (ref 4.0–10.5)

## 2016-02-27 LAB — COMPREHENSIVE METABOLIC PANEL
ALT: 10 U/L — ABNORMAL LOW (ref 14–54)
ANION GAP: 5 (ref 5–15)
AST: 18 U/L (ref 15–41)
Albumin: 2.8 g/dL — ABNORMAL LOW (ref 3.5–5.0)
Alkaline Phosphatase: 91 U/L (ref 38–126)
BILIRUBIN TOTAL: 0.4 mg/dL (ref 0.3–1.2)
BUN: 8 mg/dL (ref 6–20)
CALCIUM: 8.3 mg/dL — AB (ref 8.9–10.3)
CO2: 32 mmol/L (ref 22–32)
Chloride: 104 mmol/L (ref 101–111)
Creatinine, Ser: 0.77 mg/dL (ref 0.44–1.00)
GFR calc non Af Amer: >60 mL/min (ref >60)
Glucose, Bld: 103 mg/dL — ABNORMAL HIGH (ref 65–99)
POTASSIUM: 3.8 mmol/L (ref 3.5–5.1)
SODIUM: 141 mmol/L (ref 135–145)
TOTAL PROTEIN: 5.6 g/dL — AB (ref 6.5–8.1)

## 2016-02-27 LAB — URINALYSIS, ROUTINE W REFLEX MICROSCOPIC
Bilirubin Urine: NEGATIVE
Glucose, UA: NEGATIVE mg/dL
Hgb urine dipstick: NEGATIVE
KETONES UR: NEGATIVE mg/dL
LEUKOCYTES UA: NEGATIVE
NITRITE: NEGATIVE
PROTEIN: NEGATIVE mg/dL
Specific Gravity, Urine: 1.006 (ref 1.005–1.030)
pH: 9 — ABNORMAL HIGH (ref 5.0–8.0)

## 2016-02-27 NOTE — Progress Notes (Signed)
PROGRESS NOTE    Denise Kelly  [id number] DOB: 1940-11-18 DOA: 02/25/2016 PCP: Jonathon Bellows, MD    Brief Narrative:  Presented with 3 day history of progressive right lower extremity redness and swelling also reports subjective fevers at home. The pain has become significant today and she presented to emergency department she hasn't had any associated chest pain and dyspnea she took an oxycodone prior to her ultimate she department did not help her pain but on arrival she was noted to be hypoxic requiring 2 L of oxygen. She has bilateral edema but much worse in the Right. She denies cough occasionaly short of breath. Mobility limited after knee surgeryfamily states patient often sits down with the right leg tingling and that may have explained some increased swelling is been having trouble taking care of herself and taking showers. Family is concerned the patient is on same at home    Assessment & Plan:   Principal Problem:   Cellulitis Active Problems:   Essential hypertension, benign   Hypokalemia   Chronic back pain   Hypoxia   Lesion of liver   #1 right lower extremity cellulitis Some clinical improvement. Currently afebrile. MRSA nasal swab PCR negative. Lower extremity Dopplers negative for DVT. Continue empiric IV Rocephin. Keep right lower extremity elevated.  #2 dysuria Check a UA with cultures and sensitivities. Patient already on IV Rocephin. Follow.  #3 hypokalemia Repleted.  #4 hypertension Blood pressure stable. Continue current regimen of spironolactone.  #5 chronic back pain Stable. Continue home regimen.  #6 history of Alcohol Abuse Currently remission.  #7 hypoxia Likely from COPD. Improved on supplemental oxygen. Chest x-ray negative for any acute infiltrate. 393% on 2 L nasal cannula. Wean O2.  #8 severe debility PT/OT. Needs skilled nursing facility.  #9 lesions of the liver Stable. Outpatient follow-up.    DVT prophylaxis:  Lovenox. Code Status: Full Family Communication: Updated patient. No family at bedside. Disposition Plan: SNF when medically stable.   Consultants:   None  Procedures:   Lower extremity Dopplers 02/25/2016  2-D echo 02/26/2016  Right upper quadrant ultrasound 02/26/2016  Chest x-ray 02/25/2016  X-ray of the right tib-fib 02/25/2016  CT angiogram chest 02/25/2016  Antimicrobials:   IV Rocephin 02/26/2016   Subjective: Patient c/o dysuria. Feels a little better than on admission..  Objective: Vitals:   02/26/16 2053 02/26/16 2127 02/27/16 0436 02/27/16 1413  BP: 119/68  127/72 111/67  Pulse: 88  77 79  Resp: 18  18 20   Temp: 98.5 F (36.9 C)  97.8 F (36.6 C) 98.4 F (36.9 C)  TempSrc: Oral  Oral Oral  SpO2: 96% 96% 95% 93%  Weight:      Height:       No intake or output data in the 24 hours ending 02/27/16 1746 Filed Weights   02/25/16 1721  Weight: 95.3 kg (210 lb)    Examination:  General exam: Appears calm and comfortable  Respiratory system: Clear to auscultation. Respiratory effort normal. Cardiovascular system: S1 & S2 heard, RRR. No JVD, murmurs, rubs, gallops or clicks. No pedal edema. Gastrointestinal system: Abdomen is nondistended, soft and nontender. No organomegaly or masses felt. Normal bowel sounds heard. Central nervous system: Alert and oriented. No focal neurological deficits. Extremities: Symmetric 5 x 5 power. Skin: Erythema and some warmth noted on the right lower extremity. Psychiatry: Judgement and insight appear normal. Mood & affect appropriate.     Data Reviewed: I have personally reviewed following labs and imaging studies  CBC:  Recent Labs Lab 02/25/16 1839 02/26/16 0638 02/27/16 0552  WBC 8.0 7.7 5.9  NEUTROABS 3.9  --  2.5  HGB 13.7 13.1 13.1  HCT 41.6 39.0 39.6  MCV 89.3 87.6 90.8  PLT 156 147* [id number]*   Basic Metabolic Panel:  Recent Labs Lab 02/25/16 1839 02/26/16 0638 02/27/16 0552  NA 140 143 141    K 3.3* 3.9 3.8  CL 100* 105 104  CO2 33* 33* 32  GLUCOSE 102* 96 103*  BUN 7 7 8   CREATININE 0.67 0.79 0.77  CALCIUM 8.4* 8.4* 8.3*  MG  --  2.0  --   PHOS  --  3.9  --    GFR: Estimated Creatinine Clearance: 71.8 mL/min (by C-G formula based on SCr of 0.77 mg/dL). Liver Function Tests:  Recent Labs Lab 02/25/16 1839 02/26/16 0638 02/27/16 0552  AST 22 18 18   ALT 12* 11* 10*  ALKPHOS 113 107 91  BILITOT 0.9 0.5 0.4  PROT 6.2* 5.8* 5.6*  ALBUMIN 3.2* 2.9* 2.8*   No results for input(s): LIPASE, AMYLASE in the last 168 hours. No results for input(s): AMMONIA in the last 168 hours. Coagulation Profile: No results for input(s): INR, PROTIME in the last 168 hours. Cardiac Enzymes:  Recent Labs Lab 02/25/16 1839  TROPONINI <0.03   BNP (last 3 results) No results for input(s): PROBNP in the last 8760 hours. HbA1C:  Recent Labs  02/26/16 0638  HGBA1C 5.7*   CBG: No results for input(s): GLUCAP in the last 168 hours. Lipid Profile: No results for input(s): CHOL, HDL, LDLCALC, TRIG, CHOLHDL, LDLDIRECT in the last 72 hours. Thyroid Function Tests:  Recent Labs  02/26/16 0638  TSH 1.622   Anemia Panel: No results for input(s): VITAMINB12, FOLATE, FERRITIN, TIBC, IRON, RETICCTPCT in the last 72 hours. Sepsis Labs:  Recent Labs Lab 02/25/16 1932  LATICACIDVEN 0.53    Recent Results (from the past 240 hour(s))  MRSA PCR Screening     Status: None   Collection Time: 02/26/16  1:18 AM  Result Value Ref Range Status   MRSA by PCR NEGATIVE NEGATIVE Final    Comment:        The GeneXpert MRSA Assay (FDA approved for NASAL specimens only), is one component of a comprehensive MRSA colonization surveillance program. It is not intended to diagnose MRSA infection nor to guide or monitor treatment for MRSA infections.          Radiology Studies: Dg Chest 2 View  Result Date: 02/25/2016 CLINICAL DATA:  Hypoxia.  Right lower leg edema. EXAM: CHEST  2  VIEW COMPARISON:  01/22/2016 FINDINGS: Cardiac silhouette is normal in size. No mediastinal or hilar masses. No evidence of adenopathy. Clear lungs.  No pleural effusion.  No pneumothorax. Skeletal structures are demineralized but grossly intact. IMPRESSION: No active cardiopulmonary disease. Electronically Signed   By: Lajean Manes M.D.   On: 02/25/2016 19:28   Dg Tibia/fibula Right  Result Date: 02/25/2016 CLINICAL DATA:  Hypoxia, edema and erythema of right lower leg EXAM: RIGHT TIBIA AND FIBULA - 2 VIEW COMPARISON:  03/13/2014 FINDINGS: The patient is status post right knee replacement with similar hardware alignment. There is no fracture or dislocation. Marked subcutaneous edema. No bony erosive change or periostitis. No large joint effusion. IMPRESSION: 1. No acute osseous abnormality status post right knee replacement 2. Moderate to marked subcutaneous edema Electronically Signed   By: Donavan Foil M.D.   On: 02/25/2016 19:29   Ct Angio Chest  Pe W/cm &/or Wo Cm  Result Date: 02/25/2016 CLINICAL DATA:  Increased redness of the right lower extremity febrile EXAM: CT ANGIOGRAPHY CHEST WITH CONTRAST TECHNIQUE: Multidetector CT imaging of the chest was performed using the standard protocol during bolus administration of intravenous contrast. Multiplanar CT image reconstructions and MIPs were obtained to evaluate the vascular anatomy. CONTRAST:  100 mL Isovue 370 intravenous COMPARISON:  Chest x-ray 02/25/2016, CT scan 02/21/2014 FINDINGS: Cardiovascular: No evidence for filling defect within the central or main segmental pulmonary arteries to suggest the presence of an acute embolus. Thoracic aorta is of normal caliber. No dissection is seen. There is mild cardiomegaly. Mild aortic valvular calcification. No pericardial effusion. Mediastinum/Nodes: Thyroid gland within normal limits. Trachea and mainstem bronchi are unremarkable. Esophagus is slightly tortuous but otherwise unremarkable. No  significantly enlarged mediastinal or hilar nodes. No axillary adenopathy. Lungs/Pleura: No acute consolidation, pleural effusion or pneumothorax. Calcification in the apical portion of right upper lobe with slight stellate density unchanged. Mild linear atelectasis in the lingula. Upper Abdomen: Numerous hypodense subcentimeter lesions in the liver, larger lesions in the anterior left hepatic lobe and the posterior dome of the liver are grossly unchanged. Remaining lesions are too small to further characterize. No acute abnormality identified in the upper abdomen. Musculoskeletal: Degenerative changes of the spine. No suspicious bone lesions. Review of the MIP images confirms the above findings. IMPRESSION: 1. No evidence for pulmonary embolus or aortic dissection. 2. Mild cardiomegaly 3. No acute infiltrate, pleural effusion or pneumothorax. 4. Multiple hypodense liver lesions, larger lesions appear grossly unchanged, remaining lesions are too small to further characterize. Electronically Signed   By: Donavan Foil M.D.   On: 02/25/2016 21:56   US Abdomen Limited Ruq  Result Date: 02/26/2016 CLINICAL DATA:  Followup liver disease. EXAM: US ABDOMEN LIMITED - RIGHT UPPER QUADRANT COMPARISON:  10/31/2015 FINDINGS: Gallbladder: Persistent area of increased echogenicity along the gallbladder fundus. 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 This has been present since MRI examination 06/29/2012 and is stable. No gallstones, wall thickening or pericholecystic fluid. Common bile duct: Diameter: 6.3 mm Liver: Mildly heterogeneous echogenicity but no worrisome lesions or intrahepatic biliary dilatation. A small cyst is noted in the left hepatic lobe which appears stable. IMPRESSION: Stable fundal gallbladder abnormality since MRI 2014. No gallstones or findings for acute cholecystitis. Normal caliber common bile duct. Heterogeneous hepatic echogenicity but no worrisome lesions. Stable cysts.  Electronically Signed   By: Marijo Sanes M.D.   On: 02/26/2016 09:47        Scheduled Meds: . aspirin EC  81 mg Oral QHS  . cefTRIAXone (ROCEPHIN)  IV  1 g Intravenous QHS  . citalopram  20 mg Oral QHS  . enoxaparin (LOVENOX) injection  40 mg Subcutaneous QHS  . folic acid  1 mg Oral Daily  . gabapentin  100 mg Oral BID WC  . gabapentin  300 mg Oral QHS  . guaiFENesin  600 mg Oral BID  . sodium chloride flush  3 mL Intravenous Q12H  . spironolactone  25 mg Oral Daily  . thiamine  100 mg Oral Daily   Continuous Infusions:   LOS: 2 days    Time spent: 28 mins    THOMPSON,DANIEL, MD [facility] Pager [phone]  If 7PM-7AM, please contact night-coverage [email or web address] Password [facility] 02/27/2016, 5:46 PM

## 2016-02-27 NOTE — Progress Notes (Signed)
LCSWA spoke with patient bedside, pt. Agreeable to go SNF for rehab. Patient is requesting [facility]. LCSWA to follow up with bed offer list and continue to assist with disposition.   Kathrin Greathouse, Latanya Presser, MSW Clinical Social Worker 5E and Psychiatric Service Line [phone] 02/27/2016  12:02 PM

## 2016-02-27 NOTE — NC FL2 (Signed)
[place] LEVEL OF CARE SCREENING TOOL     IDENTIFICATION  Patient Name: Denise Kelly Birthdate: 12/05/1940 Sex: female Admission Date (Current Location): 02/25/2016  [facility] and [place] Number:  Herbalist and Address:  [facility],  [place] [address], [place]      Provider Number: [id number]  Attending Physician Name and Address:  Eugenie Filler, MD  Relative Name and Phone Number:       Current Level of Care: Hospital Recommended Level of Care: [place] Prior Approval Number:    Date Approved/Denied:   PASRR Number:  [id number]   Discharge Plan: SNF    Current Diagnoses: Patient Active Problem List   Diagnosis Date Noted  . Cellulitis 02/25/2016  . Chronic back pain 02/25/2016  . Hypoxia 02/25/2016  . Lesion of liver 02/25/2016  . Acute encephalopathy 01/23/2016  . Back pain   . Urinary tract infection without hematuria 01/22/2016  . Hip fracture ([place]) 11/28/2015  . Closed left hip fracture ([place]) 11/28/2015  . Dysuria 11/28/2015  . Fall 11/28/2015  . RLS (restless legs syndrome) 11/28/2015  . Altered mental state 11/30/2014  . Hypokalemia 03/14/2014  . Confusion 03/14/2014  . UTI (lower urinary tract infection) 03/14/2014  . Diarrhea 03/14/2014  . Effusion of right knee 02/22/2014  . Alcohol dependence ([place]) 02/22/2014  . Substance induced mood disorder ([place]) 02/22/2014  . Hepatic encephalopathy ([place])   . Altered mental status 02/21/2014  . SIRS (systemic inflammatory response syndrome) ([place]) 02/21/2014  . Complicated UTI (urinary tract infection)   . E coli bacteremia   . Sepsis due to Escherichia coli ([place])   . Screen for STD (sexually transmitted disease)   . S/P total knee arthroplasty 01/02/2014  . Right knee pain 11/10/2013  . Essential hypertension, benign 11/10/2013  . LAFB (left anterior fascicular block) 11/10/2013  . Obesity, unspecified 11/10/2013    Orientation  RESPIRATION BLADDER Height & Weight     Place, Self, Time, Situation  O2 (2L) Continent Weight: 210 lb (95.3 kg) Height:  5\' 6"  (167.6 cm)  BEHAVIORAL SYMPTOMS/MOOD NEUROLOGICAL BOWEL NUTRITION STATUS      Continent Diet (Regular)  AMBULATORY STATUS COMMUNICATION OF NEEDS Skin   Extensive Assist Verbally Other (Comment) (Celulitis of the Leg)                       Personal Care Assistance Level of Assistance  Bathing, Feeding, Dressing Bathing Assistance: Limited assistance Feeding assistance: Independent Dressing Assistance: Limited assistance     Functional Limitations Info  Sight, Hearing, Speech Sight Info: Adequate Hearing Info: Impaired Speech Info: Adequate    SPECIAL CARE FACTORS FREQUENCY  PT (By licensed PT)     PT Frequency: 5              Contractures      Additional Factors Info  Code Status, Allergies Code Status Info: Full Allergies Info: Sulfa Antibiotics, Ciprofloxacin, Motrin Ibuprofen           Current Medications (02/27/2016):  This is the current hospital active medication list Current Facility-Administered Medications  Medication Dose Route Frequency Provider Last Rate Last Dose  . 0.9 %  sodium chloride infusion  250 mL Intravenous PRN Toy Baker, MD      . acetaminophen (TYLENOL) tablet 650 mg  650 mg Oral Q6H PRN Toy Baker, MD       Or  . acetaminophen (TYLENOL) suppository 650 mg  650 mg  Rectal Q6H PRN Toy Baker, MD      . aspirin EC tablet 81 mg  81 mg Oral QHS Toy Baker, MD   81 mg at 02/26/16 2231  . cefTRIAXone (ROCEPHIN) 1 g in dextrose 5 % 50 mL IVPB  1 g Intravenous QHS Toy Baker, MD   1 g at 02/26/16 2230  . citalopram (CELEXA) tablet 20 mg  20 mg Oral QHS Toy Baker, MD   20 mg at 02/26/16 2231  . enoxaparin (LOVENOX) injection 40 mg  40 mg Subcutaneous QHS Toy Baker, MD   40 mg at 02/26/16 2231  . folic acid (FOLVITE) tablet 1 mg  1 mg Oral Daily [place] Marisa Hua, MD   1 mg at 02/26/16 1139  . gabapentin (NEURONTIN) capsule 100 mg  100 mg Oral BID WC Toy Baker, MD   100 mg at 02/27/16 0848  . gabapentin (NEURONTIN) capsule 300 mg  300 mg Oral QHS Toy Baker, MD   300 mg at 02/26/16 2230  . guaiFENesin (MUCINEX) 12 hr tablet 600 mg  600 mg Oral BID Toy Baker, MD   600 mg at 02/26/16 2231  . HYDROcodone-acetaminophen (NORCO/VICODIN) 5-325 MG per tablet 1-2 tablet  1-2 tablet Oral Q4H PRN Toy Baker, MD   2 tablet at 02/27/16 0848  . ipratropium-albuterol (DUONEB) 0.5-2.5 (3) MG/3ML nebulizer solution 3 mL  3 mL Nebulization Q4H PRN [place] L Johnson, MD      . loratadine (CLARITIN) tablet 10 mg  10 mg Oral Daily PRN Toy Baker, MD      . methocarbamol (ROBAXIN) tablet 250 mg  250 mg Oral BID PRN Toy Baker, MD   250 mg at 02/26/16 2348  . ondansetron (ZOFRAN) tablet 4 mg  4 mg Oral Q6H PRN Toy Baker, MD       Or  . ondansetron (ZOFRAN) injection 4 mg  4 mg Intravenous Q6H PRN Toy Baker, MD   4 mg at 02/26/16 2230  . oxyCODONE (Oxy IR/ROXICODONE) immediate release tablet 5 mg  5 mg Oral Q4H PRN Toy Baker, MD   5 mg at 02/27/16 0855  . pramipexole (MIRAPEX) tablet 0.375 mg  0.375 mg Oral BID PRN Toy Baker, MD      . sodium chloride flush (NS) 0.9 % injection 3 mL  3 mL Intravenous Q12H Toy Baker, MD   3 mL at 02/26/16 0108  . sodium chloride flush (NS) 0.9 % injection 3 mL  3 mL Intravenous PRN Toy Baker, MD      . spironolactone (ALDACTONE) tablet 25 mg  25 mg Oral Daily Toy Baker, MD   25 mg at 02/26/16 1018  . thiamine (VITAMIN B-1) tablet 100 mg  100 mg Oral Daily Toy Baker, MD   100 mg at 02/26/16 1018  . traMADol (ULTRAM) tablet 50 mg  50 mg Oral Q6H PRN Toy Baker, MD      . traZODone (DESYREL) tablet 50 mg  50 mg Oral QHS PRN Toy Baker, MD   50 mg at 02/26/16 2230     Discharge Medications: Please see  discharge summary for a list of discharge medications.  Relevant Imaging Results:  Relevant Lab Results:   Additional Information SSN: [id number]  Lia Hopping, LCSW

## 2016-02-28 LAB — URINE CULTURE

## 2016-02-28 MED ORDER — PHENAZOPYRIDINE HCL 100 MG PO TABS
100.0000 mg | ORAL_TABLET | Freq: Three times a day (TID) | ORAL | Status: DC
  Administered 2016-02-28: 100 mg via ORAL
  Filled 2016-02-28 (×2): qty 1

## 2016-02-28 MED ORDER — SENNOSIDES-DOCUSATE SODIUM 8.6-50 MG PO TABS: 1.0000 | ORAL_TABLET | Freq: Two times a day (BID) | ORAL | Status: DC

## 2016-02-28 MED ORDER — OXYCODONE HCL 5 MG PO TABS: 5.0000 mg | ORAL_TABLET | ORAL | 0 refills | Status: DC | PRN

## 2016-02-28 MED ORDER — FOLIC ACID 1 MG PO TABS: 1.0000 mg | ORAL_TABLET | Freq: Every day | ORAL | Status: DC

## 2016-02-28 MED ORDER — PHENAZOPYRIDINE HCL 100 MG PO TABS: 100.0000 mg | ORAL_TABLET | Freq: Three times a day (TID) | ORAL | 0 refills | Status: AC

## 2016-02-28 MED ORDER — IPRATROPIUM-ALBUTEROL 0.5-2.5 (3) MG/3ML IN SOLN: 3.0000 mL | RESPIRATORY_TRACT | 0 refills | Status: DC | PRN

## 2016-02-28 MED ORDER — THIAMINE HCL 100 MG PO TABS: 100.0000 mg | ORAL_TABLET | Freq: Every day | ORAL | Status: DC

## 2016-02-28 MED ORDER — CEPHALEXIN 500 MG PO CAPS: 500.0000 mg | ORAL_CAPSULE | Freq: Three times a day (TID) | ORAL | 0 refills | Status: AC

## 2016-02-28 MED ORDER — TRAMADOL HCL 50 MG PO TABS: 50.0000 mg | ORAL_TABLET | Freq: Four times a day (QID) | ORAL | 0 refills | Status: DC | PRN

## 2016-02-28 NOTE — Progress Notes (Addendum)
DC Summary faxed PTAR called for pick up LCSWA spoke to patient and daughter at bedside, informed ETA for pick up. 2 hours. Notified facility.  No other needs identified.   Kathrin Greathouse, Latanya Presser, MSW Clinical Social Worker 5E and Psychiatric Service Line [phone] 02/28/2016  3:57 PM

## 2016-02-28 NOTE — Progress Notes (Signed)
Attempted to evaluate pt today. Pt state she was leaving for SNF today and does not want to do therapy before she leaves for fear of getting fatigued.  Explained it would be good for pt to get OOB and move around a bit before she leaves and she continued to decline. Will eval pt in am if she is still here.  Jinger Neighbors, [place] [phone]

## 2016-02-28 NOTE — Progress Notes (Signed)
Pt discharged from the unit via PTAR. Discharge instructions were reviewed with the pt and the family member. Report called to [facility]. No questions or concerns at this time.   Myeasha Ballowe W Shayden Gingrich, RN

## 2016-02-28 NOTE — Progress Notes (Signed)
Physical Therapy Treatment Patient Details Name: Denise Kelly MRN: [id number] DOB: December 17, 1940 Today's Date: 02/28/2016    History of Present Illness 75 yo female admitted with esential hypertension, R LE cellulitis. Hx of COPD, ETOH abuse, chronic pain, R TKA 2015, L hip IM nail 11/2015    PT Comments    Progressing with mobility. Pt reports R LE feels better however L LE is more painful on today. Pt did agree to getting up to [facility] but she declined ambulation due to L LE pain. Pt states she may d/c to SNF today.   Follow Up Recommendations  SNF     Equipment Recommendations  None recommended by PT    Recommendations for Other Services       Precautions / Restrictions Precautions Precautions: Fall Restrictions Weight Bearing Restrictions: No    Mobility  Bed Mobility Overal bed mobility: Needs Assistance Bed Mobility: Supine to Sit;Sit to Supine     Supine to sit: Min assist Sit to supine: Min assist   General bed mobility comments: Assist for L LE. Increased time. Pt relied on bedrail.   Transfers Overall transfer level: Needs assistance   Transfers: Stand Pivot Transfers   Stand pivot transfers: Min assist       General transfer comment: Assist to rise, stabilize, control descent, shift weight. VCs hand placement. Pt used bedrail and BSC armrests for support. x2, bed<>bsc  Ambulation/Gait             General Gait Details: NT-pt declined due to L LE pain   Stairs            Wheelchair Mobility    Modified Rankin (Stroke Patients Only)       Balance                                    Cognition Arousal/Alertness: Awake/alert Behavior During Therapy: WFL for tasks assessed/performed Overall Cognitive Status: Within Functional Limits for tasks assessed                      Exercises General Exercises - Lower Extremity Heel Slides: AAROM;Left;10 reps;Supine Hip ABduction/ADduction: AAROM;Left;10 reps;Supine     General Comments        Pertinent Vitals/Pain Pain Assessment: Faces Faces Pain Scale: Hurts even more Pain Location: bil LEs (L worse than R on today) Pain Descriptors / Indicators: Aching;Sore Pain Intervention(s): Monitored during session    Home Living                      Prior Function            PT Goals (current goals can now be found in the care plan section) Progress towards PT goals: Progressing toward goals    Frequency    Min 3X/week      PT Plan Current plan remains appropriate    Co-evaluation             End of Session   Activity Tolerance: Patient limited by fatigue;Patient limited by pain Patient left: in bed;with call bell/phone within reach;with bed alarm set     Time: 1333-1401 PT Time Calculation (min) (ACUTE ONLY): 28 min  Charges:  $Therapeutic Activity: 8-22 mins                    G Codes:      Weston Anna, MPT Pager: [phone]

## 2016-02-28 NOTE — Discharge Summary (Signed)
Physician Discharge Summary  TIOSHA HUSS [id number] DOB: 1940/04/09 DOA: 02/25/2016  PCP: Jonathon Bellows, MD  Admit date: 02/25/2016 Discharge date: 02/28/2016  Time spent: 65 minutes  Recommendations for Outpatient Follow-up:  1. Follow up with MD at SNF. Patient will need BMET in 1 week.   Discharge Diagnoses:  Principal Problem:   Cellulitis Active Problems:   Essential hypertension, benign   Hypokalemia   Chronic back pain   Hypoxia   Lesion of liver   Cellulitis of right leg   Discharge Condition: Stable and improved.  Diet recommendation: Regular  Filed Weights   02/25/16 1721  Weight: 95.3 kg (210 lb)    History of present illness:  Per Dr. Jake Samples Denise Kelly is a 75 y.o. female with medical history significant of remote history of alcohol abuse, COPD, HTN.    Presented with 3 day history of progressive right lower extremity redness and swelling also reported subjective fevers at home. The pain had become significant on the day of admission and she presented to emergency department she hasn't had any associated chest pain and dyspnea she took an oxycodone prior to her presentation to ED department did not help her pain but on arrival she was noted to be hypoxic requiring 2 L of oxygen. She had bilateral edema but much worse in the  Right. She denied cough occasionaly short of breath. Mobility limited after knee surgery family states patient often sits down with the right leg tingling and that may have explained some increased swelling has been having trouble taking care of herself and taking showers. Family was concerned the patient was on same at home   Regarding pertinent Chronic problems: Patient have had recurrent admissions recently she's been having worsening pain in her lower extremities and was treated with prednisone for presumed radiculopathy she was admitted beginning of November for acute encephalopathy was thought to be medication induced she  was found to have at that time UTI with Escherichia coli. Patient has chronic back pain in pain clinic.    IN ER:  Temp (24hrs), Avg:98.6 F (37 C), Min:98.2 F (36.8 C), Max:98.9 F (37.2 C)   Oxygen 95% on 2L BP 127/71 Lactic acid 0.53 potassium 3.3 creatinine 0.67 abdomen 3.2 WBC 8.0 hemoglobin 13.7 BNP 33 troponin less than 0.03 No evidence of DVT per ultrasound No evidence of PE per CT scan evidence of liver lesions hypodense throughout Chest x-ray negative Following Medications were ordered in ER: Medications  ceFAZolin (ANCEF) IVPB 1 g/50 mL premix (1 g Intravenous New Bag/Given 02/25/16 2017)  potassium chloride SA (K-DUR,KLOR-CON) CR tablet 40 mEq (not administered)  HYDROmorphone (DILAUDID) injection 1 mg (1 mg Intravenous Given 02/25/16 1851)     Hospitalist was called for admission forCellulitis   Hospital Course:  #1 right lower extremity cellulitis Patient had presented right lower extremity cellulitis. Patient remained afebrile throughout the hospitalization. Patient was placed empirically on IV Rocephin. MRSA nasal swab PCR negative. Lower extremity Dopplers negative for DVT. Patient improved clinically and patient will be transitioned to oral Keflex for 6 more days to complete a course of antibiotic treatment. Outpatient follow-up.   #2 dysuria Urinalysis obtained was nitrite negative leukocytes negative. Patient was on IV Rocephin secondary to right lower extremity cellulitis. Patient was placed on Pyridium with improvement with dysuria.   #3 hypokalemia Repleted.  #4 hypertension Blood pressure stable. Continued on home regimen of spironolactone.  #5 chronic back pain Stable. Continued on home regimen.  #6 history of  Alcohol Abuse Currently remission.  #7 hypoxia Likely from COPD. Improved on supplemental oxygen. Chest x-ray negative for any acute infiltrate. Patient was initially placed on oxygen which was weaned down and patient was satting  92-93% on room air by day of discharge.   #8 severe debility PT/OT. Needs skilled nursing facility.  #9 lesions of the liver Stable. Outpatient follow-up.    Procedures:  Lower extremity Dopplers 02/25/2016  2-D echo 02/26/2016  Right upper quadrant ultrasound 02/26/2016  Chest x-ray 02/25/2016  X-ray of the right tib-fib 02/25/2016  CT angiogram chest 02/25/2016  Consultations:  None  Discharge Exam: Vitals:   02/28/16 0448 02/28/16 1512  BP: 114/71 118/72  Pulse: 77 84  Resp: 18 20  Temp: 98 F (36.7 C) 98.2 F (36.8 C)    General: NAD Cardiovascular: RRR Respiratory: CTAB Extremities:RLE with decreased erythema, less TTP, less warmth.  Discharge Instructions   Discharge Instructions    Diet general    Complete by:  As directed    Increase activity slowly    Complete by:  As directed      Current Discharge Medication List    START taking these medications   Details  cephALEXin (KEFLEX) 500 MG capsule Take 1 capsule (500 mg total) by mouth 3 (three) times daily. Take for 6 days then stop. Qty: 18 capsule, Refills: 0    folic acid (FOLVITE) 1 MG tablet Take 1 tablet (1 mg total) by mouth daily.    ipratropium-albuterol (DUONEB) 0.5-2.5 (3) MG/3ML SOLN Take 3 mLs by nebulization every 4 (four) hours as needed. Qty: 360 mL, Refills: 0    phenazopyridine (PYRIDIUM) 100 MG tablet Take 1 tablet (100 mg total) by mouth 3 (three) times daily with meals. Qty: 10 tablet, Refills: 0    senna-docusate (SENOKOT S) 8.6-50 MG tablet Take 1 tablet by mouth 2 (two) times daily.    thiamine 100 MG tablet Take 1 tablet (100 mg total) by mouth daily.      CONTINUE these medications which have CHANGED   Details  oxyCODONE (OXY IR/ROXICODONE) 5 MG immediate release tablet Take 1 tablet (5 mg total) by mouth every 4 (four) hours as needed for severe pain. Qty: 10 tablet, Refills: 0    traMADol (ULTRAM) 50 MG tablet Take 1 tablet (50 mg total) by mouth  every 6 (six) hours as needed for moderate pain. Qty: 20 tablet, Refills: 0      CONTINUE these medications which have NOT CHANGED   Details  acetaminophen (TYLENOL) 500 MG tablet Take 1,000 mg by mouth every 6 (six) hours as needed for mild pain, moderate pain or headache.     acidophilus (RISAQUAD) CAPS capsule Take 1 capsule by mouth daily.    aspirin EC 81 MG tablet Take 81 mg by mouth at bedtime.     citalopram (CELEXA) 20 MG tablet Take 1 tablet (20 mg total) by mouth at bedtime. Qty: 30 tablet, Refills: 1    gabapentin (NEURONTIN) 100 MG capsule Take 100-300 mg by mouth 3 (three) times daily. Pt takes one in the morning, one at noon, and three at bedtime.    hydrOXYzine (ATARAX/VISTARIL) 25 MG tablet Take 50 mg by mouth at bedtime as needed for anxiety (and/or sleep).     loratadine (CLARITIN) 10 MG tablet Take 10 mg by mouth daily as needed for allergies.     methocarbamol (ROBAXIN) 500 MG tablet Take 250 mg by mouth 2 (two) times daily as needed for muscle spasms.  ondansetron (ZOFRAN-ODT) 4 MG disintegrating tablet Take 4 mg by mouth every 6 (six) hours as needed for nausea or vomiting.     pramipexole (MIRAPEX) 0.125 MG tablet Take 0.375 mg by mouth 2 (two) times daily as needed (for restless leg syndrome).     spironolactone (ALDACTONE) 25 MG tablet Take 25 mg by mouth daily.    traZODone (DESYREL) 50 MG tablet Take 50 mg by mouth at bedtime as needed for sleep.       Allergies  Allergen Reactions  . Sulfa Antibiotics Hives, Swelling and Other (See Comments)    Reaction:  Facial/eye swelling   . Ciprofloxacin Itching  . Motrin [Ibuprofen] Palpitations   Follow-up Information    f/u with MD at SNF Follow up.            The results of significant diagnostics from this hospitalization (including imaging, microbiology, ancillary and laboratory) are listed below for reference.    Significant Diagnostic Studies: Dg Chest 2 View  Result Date:  02/25/2016 CLINICAL DATA:  Hypoxia.  Right lower leg edema. EXAM: CHEST  2 VIEW COMPARISON:  01/22/2016 FINDINGS: Cardiac silhouette is normal in size. No mediastinal or hilar masses. No evidence of adenopathy. Clear lungs.  No pleural effusion.  No pneumothorax. Skeletal structures are demineralized but grossly intact. IMPRESSION: No active cardiopulmonary disease. Electronically Signed   By: Lajean Manes M.D.   On: 02/25/2016 19:28   Dg Tibia/fibula Right  Result Date: 02/25/2016 CLINICAL DATA:  Hypoxia, edema and erythema of right lower leg EXAM: RIGHT TIBIA AND FIBULA - 2 VIEW COMPARISON:  03/13/2014 FINDINGS: The patient is status post right knee replacement with similar hardware alignment. There is no fracture or dislocation. Marked subcutaneous edema. No bony erosive change or periostitis. No large joint effusion. IMPRESSION: 1. No acute osseous abnormality status post right knee replacement 2. Moderate to marked subcutaneous edema Electronically Signed   By: Donavan Foil M.D.   On: 02/25/2016 19:29   Ct Angio Chest Pe W/cm &/or Wo Cm  Result Date: 02/25/2016 CLINICAL DATA:  Increased redness of the right lower extremity febrile EXAM: CT ANGIOGRAPHY CHEST WITH CONTRAST TECHNIQUE: Multidetector CT imaging of the chest was performed using the standard protocol during bolus administration of intravenous contrast. Multiplanar CT image reconstructions and MIPs were obtained to evaluate the vascular anatomy. CONTRAST:  100 mL Isovue 370 intravenous COMPARISON:  Chest x-ray 02/25/2016, CT scan 02/21/2014 FINDINGS: Cardiovascular: No evidence for filling defect within the central or main segmental pulmonary arteries to suggest the presence of an acute embolus. Thoracic aorta is of normal caliber. No dissection is seen. There is mild cardiomegaly. Mild aortic valvular calcification. No pericardial effusion. Mediastinum/Nodes: Thyroid gland within normal limits. Trachea and mainstem bronchi are  unremarkable. Esophagus is slightly tortuous but otherwise unremarkable. No significantly enlarged mediastinal or hilar nodes. No axillary adenopathy. Lungs/Pleura: No acute consolidation, pleural effusion or pneumothorax. Calcification in the apical portion of right upper lobe with slight stellate density unchanged. Mild linear atelectasis in the lingula. Upper Abdomen: Numerous hypodense subcentimeter lesions in the liver, larger lesions in the anterior left hepatic lobe and the posterior dome of the liver are grossly unchanged. Remaining lesions are too small to further characterize. No acute abnormality identified in the upper abdomen. Musculoskeletal: Degenerative changes of the spine. No suspicious bone lesions. Review of the MIP images confirms the above findings. IMPRESSION: 1. No evidence for pulmonary embolus or aortic dissection. 2. Mild cardiomegaly 3. No acute infiltrate, pleural effusion or pneumothorax.  4. Multiple hypodense liver lesions, larger lesions appear grossly unchanged, remaining lesions are too small to further characterize. Electronically Signed   By: Donavan Foil M.D.   On: 02/25/2016 21:56   US Abdomen Limited Ruq  Result Date: 02/26/2016 CLINICAL DATA:  Followup liver disease. EXAM: US ABDOMEN LIMITED - RIGHT UPPER QUADRANT COMPARISON:  10/31/2015 FINDINGS: Gallbladder: Persistent area of increased echogenicity along the gallbladder fundus. 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 This has been present since MRI examination 06/29/2012 and is stable. No gallstones, wall thickening or pericholecystic fluid. Common bile duct: Diameter: 6.3 mm Liver: Mildly heterogeneous echogenicity but no worrisome lesions or intrahepatic biliary dilatation. A small cyst is noted in the left hepatic lobe which appears stable. IMPRESSION: Stable fundal gallbladder abnormality since MRI 2014. No gallstones or findings for acute cholecystitis. Normal caliber common bile duct.  Heterogeneous hepatic echogenicity but no worrisome lesions. Stable cysts. Electronically Signed   By: Marijo Sanes M.D.   On: 02/26/2016 09:47    Microbiology: Recent Results (from the past 240 hour(s))  MRSA PCR Screening     Status: None   Collection Time: 02/26/16  1:18 AM  Result Value Ref Range Status   MRSA by PCR NEGATIVE NEGATIVE Final    Comment:        The GeneXpert MRSA Assay (FDA approved for NASAL specimens only), is one component of a comprehensive MRSA colonization surveillance program. It is not intended to diagnose MRSA infection nor to guide or monitor treatment for MRSA infections.   Culture, Urine     Status: Abnormal   Collection Time: 02/27/16  1:39 PM  Result Value Ref Range Status   Specimen Description URINE, RANDOM  Final   Special Requests NONE  Final   Culture MULTIPLE SPECIES PRESENT, SUGGEST RECOLLECTION (A)  Final   Report Status 02/28/2016 FINAL  Final     Labs: Basic Metabolic Panel:  Recent Labs Lab 02/25/16 1839 02/26/16 0638 02/27/16 0552  NA 140 143 141  K 3.3* 3.9 3.8  CL 100* 105 104  CO2 33* 33* 32  GLUCOSE 102* 96 103*  BUN 7 7 8   CREATININE 0.67 0.79 0.77  CALCIUM 8.4* 8.4* 8.3*  MG  --  2.0  --   PHOS  --  3.9  --    Liver Function Tests:  Recent Labs Lab 02/25/16 1839 02/26/16 0638 02/27/16 0552  AST 22 18 18   ALT 12* 11* 10*  ALKPHOS 113 107 91  BILITOT 0.9 0.5 0.4  PROT 6.2* 5.8* 5.6*  ALBUMIN 3.2* 2.9* 2.8*   No results for input(s): LIPASE, AMYLASE in the last 168 hours. No results for input(s): AMMONIA in the last 168 hours. CBC:  Recent Labs Lab 02/25/16 1839 02/26/16 0638 02/27/16 0552  WBC 8.0 7.7 5.9  NEUTROABS 3.9  --  2.5  HGB 13.7 13.1 13.1  HCT 41.6 39.0 39.6  MCV 89.3 87.6 90.8  PLT 156 147* 139*   Cardiac Enzymes:  Recent Labs Lab 02/25/16 1839  TROPONINI <0.03   BNP: BNP (last 3 results)  Recent Labs  02/25/16 1839  BNP 33.0    ProBNP (last 3 results) No results  for input(s): PROBNP in the last 8760 hours.  CBG: No results for input(s): GLUCAP in the last 168 hours.     SignedIrine Seal MD.  [facility] 02/28/2016, 3:31 PM

## 2016-02-28 NOTE — Clinical Social Work Placement (Addendum)
   CLINICAL SOCIAL WORK PLACEMENT  NOTE  Date:  02/28/2016  Patient Details  Name: Denise Kelly MRN: [id number] Date of Birth: [birth year]  Clinical Social Work is seeking post-discharge placement for this patient at the [place] level of care (*CSW will initial, date and re-position this form in  chart as items are completed):  Yes   Patient/family provided with [place] Work Department's list of facilities offering this level of care within the geographic area requested by the patient (or if unable, by the patient's family).  Yes   Patient/family informed of their freedom to choose among providers that offer the needed level of care, that participate in Medicare, Medicaid or managed care program needed by the patient, have an available bed and are willing to accept the patient.  Yes   Patient/family informed of [facility]'s ownership interest in [facility] and [facility], as well as of the fact that they are under no obligation to receive care at these facilities.  PASRR submitted to EDS on       PASRR number received on       Existing PASRR number confirmed on 02/28/16     FL2 transmitted to all facilities in geographic area requested by pt/family on       FL2 transmitted to all facilities within larger geographic area on 02/27/16     Patient informed that his/her managed care company has contracts with or will negotiate with certain facilities, including the following:  [facility], [place]     Yes   Patient/family informed of bed offers received.  Patient chooses bed at [place], [place]     Physician recommends and patient chooses bed at [place], [place]    Patient to be transferred to [place], [place] on 02/28/16.  Patient to be transferred to facility by   [facility]   Patient family notified on 02/28/16 of transfer.  Name of family member notified:     [place]  PHYSICIAN Please prepare priority  discharge summary, including medications     Additional Comment:    _______________________________________________ Lia Hopping, LCSW 02/28/2016, 10:44 AM

## 2016-04-28 ENCOUNTER — Other Ambulatory Visit: Payer: Self-pay | Admitting: Neurosurgery

## 2016-05-09 NOTE — Pre-Procedure Instructions (Addendum)
Denise Kelly  05/09/2016      [place], [place] [address] Phone: [phone] Fax: [phone]    Your procedure is scheduled on Wed. Mar. 7th   Report to [facility] Admitting at 6:30 A.M.             (posted surgery time 8:30 am - 12:19 pm)   Call this number if you have problems the MORNING of surgery:  [phone].  [facility] is closed on weekends.   Remember:  Do not eat food or drink liquids after midnight Tuesday.   Take these medicines the morning of surgery with A SIP OF WATER : tylenol if needed, citalopram(celexa), gabapentin(neurontin), claritin if needed,methocarbamol (robaxin) if needed, zofran if needed,ocycodone or tramadol if needed,eye drops, pramipexole(miraplex),             1 week prior to surgery stop aspirin, aleve, advil, motrin, ibuprofen, fish oil, BC Powders, Goody's, vitamins/herbal medicines.   Do not wear jewelry.  Do not wear lotions, powders, or deoderant.  Do not shave underarms & legs 48 hours prior to surgery.     Do not bring valuables to the hospital.  [facility] is not responsible for any belongings or valuables.  Contacts, dentures or bridgework may not be worn into surgery.  Leave your suitcase in the car.  After surgery it may be brought to your room.  For patients admitted to the hospital, discharge time will be determined by your treatment team.  Patients discharged the day of surgery will not be allowed to drive home.    Special instructions:   [facility]- Preparing For Surgery  Before surgery, you can play an important role. Because skin is not sterile, your skin needs to be as free of germs as possible. You can reduce the number of germs on your skin by washing with CHG (chlorahexidine gluconate) Soap before surgery.  CHG is an antiseptic cleaner which kills germs and bonds with the skin to continue killing germs  even after washing.  Please do not use if you have an allergy to CHG or antibacterial soaps. If your skin becomes reddened/irritated stop using the CHG.  Do not shave (including legs and underarms) for at least 48 hours prior to first CHG shower. It is OK to shave your face.  Please follow these instructions carefully.   1. Shower the NIGHT BEFORE SURGERY and the MORNING OF SURGERY with CHG.   2. If you chose to wash your hair, wash your hair first as usual with your normal shampoo.  3. After you shampoo, rinse your hair and body thoroughly to remove the shampoo.  4. Use CHG as you would any other liquid soap. You can apply CHG directly to the skin and wash gently with a scrungie or a clean washcloth.   5. Apply the CHG Soap to your body ONLY FROM THE NECK DOWN.  Do not use on open wounds or open sores. Avoid contact with your eyes, ears, mouth and genitals (private parts). Wash genitals (private parts) with your normal soap.  6. Wash thoroughly, paying special attention to the area where your surgery will be performed.  7. Thoroughly rinse your body with warm water from the neck down.  8. DO NOT shower/wash with your normal soap after using and rinsing off the CHG Soap.  9. Pat yourself dry with a CLEAN TOWEL.   10.  [place]   11. Place CLEAN SHEETS on your bed the night of your first shower and DO NOT SLEEP WITH PETS.    Day of Surgery: Do not apply any deodorants/lotions. Please wear clean clothes to the hospital/surgery center.      Please read over the following fact sheets that you were given. Coughing and Deep Breathing, MRSA Information and Surgical Site Infection Prevention

## 2016-05-12 ENCOUNTER — Inpatient Hospital Stay (HOSPITAL_COMMUNITY)
Admission: RE | Admit: 2016-05-12 | Discharge: 2016-05-12 | Disposition: A | Discharge status: Home / Self Care | Payer: Self-pay | Source: Ambulatory Visit

## 2016-05-15 ENCOUNTER — Encounter (HOSPITAL_COMMUNITY): Payer: Self-pay

## 2016-05-15 ENCOUNTER — Encounter (HOSPITAL_COMMUNITY)
Admission: RE | Admit: 2016-05-15 | Discharge: 2016-05-15 | Disposition: A | Discharge status: Home / Self Care | Payer: Medicare Other | Source: Ambulatory Visit | Attending: Neurosurgery | Admitting: Neurosurgery

## 2016-05-15 DIAGNOSIS — I10 Essential (primary) hypertension: Secondary | ICD-10-CM | POA: Diagnosis not present

## 2016-05-15 DIAGNOSIS — K219 Gastro-esophageal reflux disease without esophagitis: Secondary | ICD-10-CM | POA: Insufficient documentation

## 2016-05-15 DIAGNOSIS — Z01812 Encounter for preprocedural laboratory examination: Secondary | ICD-10-CM | POA: Insufficient documentation

## 2016-05-15 HISTORY — DX: Depression, unspecified: F32.A

## 2016-05-15 HISTORY — DX: Urinary tract infection, site not specified: N39.0

## 2016-05-15 HISTORY — DX: Major depressive disorder, single episode, unspecified: F32.9

## 2016-05-15 HISTORY — DX: Cellulitis, unspecified: L03.90

## 2016-05-15 HISTORY — DX: Unspecified hearing loss, unspecified ear: H91.90

## 2016-05-15 LAB — CBC
HEMATOCRIT: 43.6 % (ref 36.0–46.0)
HEMOGLOBIN: 14.7 g/dL (ref 12.0–15.0)
MCH: 30.5 pg (ref 26.0–34.0)
MCHC: 33.7 g/dL (ref 30.0–36.0)
MCV: 90.5 fL (ref 78.0–100.0)
Platelets: 217 10*3/uL (ref 150–400)
RBC: 4.82 MIL/uL (ref 3.87–5.11)
RDW: 14.2 % (ref 11.5–15.5)
WBC: 9.6 10*3/uL (ref 4.0–10.5)

## 2016-05-15 LAB — TYPE AND SCREEN
ABO/RH(D): O NEG
Antibody Screen: NEGATIVE

## 2016-05-15 LAB — BASIC METABOLIC PANEL
ANION GAP: 8 (ref 5–15)
BUN: <5 mg/dL — ABNORMAL LOW (ref 6–20)
CALCIUM: 9 mg/dL (ref 8.9–10.3)
CO2: 30 mmol/L (ref 22–32)
Chloride: 101 mmol/L (ref 101–111)
Creatinine, Ser: 0.79 mg/dL (ref 0.44–1.00)
Glucose, Bld: 99 mg/dL (ref 65–99)
POTASSIUM: 3.6 mmol/L (ref 3.5–5.1)
SODIUM: 139 mmol/L (ref 135–145)

## 2016-05-15 LAB — SURGICAL PCR SCREEN
MRSA, PCR: NEGATIVE
Staphylococcus aureus: NEGATIVE

## 2016-05-15 NOTE — Pre-Procedure Instructions (Signed)
    Denise Kelly  05/15/2016      Your procedure is scheduled on Wed. Mar. 7th   Report to [facility] Admitting at 6:30 A.M.             (posted surgery time 8:30 am - 12:19 pm)   Call this number if you have problems the MORNING of surgery:[phone].                  For any other questions, please call [phone], Monday - Friday 8 AM - 4 PM.   Remember:  Do not eat food or drink liquids after midnight Tuesday.   Take these medicines the morning of surgery with A SIP OF WATER : tylenol if needed, citalopram(celexa), gabapentin(neurontin), claritin if needed,methocarbamol (robaxin) if needed, zofran if needed,ocycodone or tramadol if needed,eye drops, pramipexole(miraplex),             1 week prior to surgery stop aspirin, aleve, advil, motrin, ibuprofen, fish oil, BC Powders, Goody's, vitamins/herbal medicines.   Do not wear jewelry.  Do not wear lotions, powders, or deoderant.  Do not shave underarms & legs 48 hours prior to surgery.    Do not bring valuables to the hospital.  [facility] is not responsible for any belongings or valuables.  Contacts, dentures or bridgework may not be worn into surgery.  Leave your suitcase in the car.  After surgery it may be brought to your room.  For patients admitted to the hospital, discharge time will be determined by your treatment team.  Patients discharged the day of surgery will not be allowed to drive home.    Please read over the following fact sheets that you were given. Coughing and Deep Breathing, MRSA Information and Surgical Site Infection Prevention

## 2016-05-21 ENCOUNTER — Encounter (HOSPITAL_COMMUNITY): Admission: RE | Payer: Self-pay | Source: Ambulatory Visit

## 2016-05-21 ENCOUNTER — Inpatient Hospital Stay (HOSPITAL_COMMUNITY): Admission: RE | Admit: 2016-05-21 | Payer: Medicare Other | Source: Ambulatory Visit | Admitting: Neurosurgery

## 2016-05-21 SURGERY — POSTERIOR LUMBAR FUSION 1 LEVEL
Anesthesia: General | Site: Back

## 2016-05-23 ENCOUNTER — Emergency Department (HOSPITAL_BASED_OUTPATIENT_CLINIC_OR_DEPARTMENT_OTHER)
Admission: EM | Admit: 2016-05-23 | Discharge: 2016-05-23 | Disposition: A | Discharge status: Home / Self Care | Payer: Medicare Other | Source: Home / Self Care

## 2016-05-23 ENCOUNTER — Inpatient Hospital Stay (HOSPITAL_COMMUNITY)
Admission: EM | Admit: 2016-05-23 | Discharge: 2016-05-29 | DRG: 603 | Disposition: A | Discharge status: Home / Self Care | Payer: Medicare Other | Attending: Internal Medicine | Admitting: Internal Medicine

## 2016-05-23 ENCOUNTER — Encounter (HOSPITAL_COMMUNITY): Payer: Self-pay | Admitting: Emergency Medicine

## 2016-05-23 DIAGNOSIS — Z881 Allergy status to other antibiotic agents status: Secondary | ICD-10-CM

## 2016-05-23 DIAGNOSIS — I10 Essential (primary) hypertension: Secondary | ICD-10-CM | POA: Diagnosis not present

## 2016-05-23 DIAGNOSIS — E876 Hypokalemia: Secondary | ICD-10-CM | POA: Diagnosis present

## 2016-05-23 DIAGNOSIS — M549 Dorsalgia, unspecified: Secondary | ICD-10-CM | POA: Diagnosis present

## 2016-05-23 DIAGNOSIS — H9190 Unspecified hearing loss, unspecified ear: Secondary | ICD-10-CM | POA: Diagnosis present

## 2016-05-23 DIAGNOSIS — F329 Major depressive disorder, single episode, unspecified: Secondary | ICD-10-CM | POA: Diagnosis present

## 2016-05-23 DIAGNOSIS — L03115 Cellulitis of right lower limb: Secondary | ICD-10-CM | POA: Diagnosis present

## 2016-05-23 DIAGNOSIS — M79609 Pain in unspecified limb: Secondary | ICD-10-CM | POA: Diagnosis not present

## 2016-05-23 DIAGNOSIS — F1021 Alcohol dependence, in remission: Secondary | ICD-10-CM | POA: Diagnosis present

## 2016-05-23 DIAGNOSIS — F32A Depression, unspecified: Secondary | ICD-10-CM | POA: Diagnosis present

## 2016-05-23 DIAGNOSIS — M7989 Other specified soft tissue disorders: Secondary | ICD-10-CM

## 2016-05-23 DIAGNOSIS — I872 Venous insufficiency (chronic) (peripheral): Secondary | ICD-10-CM | POA: Diagnosis present

## 2016-05-23 DIAGNOSIS — Z79899 Other long term (current) drug therapy: Secondary | ICD-10-CM

## 2016-05-23 DIAGNOSIS — F1020 Alcohol dependence, uncomplicated: Secondary | ICD-10-CM | POA: Diagnosis present

## 2016-05-23 DIAGNOSIS — G894 Chronic pain syndrome: Secondary | ICD-10-CM | POA: Diagnosis present

## 2016-05-23 DIAGNOSIS — Z7982 Long term (current) use of aspirin: Secondary | ICD-10-CM

## 2016-05-23 DIAGNOSIS — Z886 Allergy status to analgesic agent status: Secondary | ICD-10-CM

## 2016-05-23 DIAGNOSIS — L0390 Cellulitis, unspecified: Secondary | ICD-10-CM

## 2016-05-23 DIAGNOSIS — Z96653 Presence of artificial knee joint, bilateral: Secondary | ICD-10-CM | POA: Diagnosis present

## 2016-05-23 DIAGNOSIS — R600 Localized edema: Secondary | ICD-10-CM | POA: Diagnosis present

## 2016-05-23 DIAGNOSIS — Z882 Allergy status to sulfonamides status: Secondary | ICD-10-CM

## 2016-05-23 DIAGNOSIS — M1990 Unspecified osteoarthritis, unspecified site: Secondary | ICD-10-CM | POA: Diagnosis present

## 2016-05-23 LAB — BASIC METABOLIC PANEL
ANION GAP: 5 (ref 5–15)
BUN: 10 mg/dL (ref 6–20)
CO2: 33 mmol/L — ABNORMAL HIGH (ref 22–32)
Calcium: 8.6 mg/dL — ABNORMAL LOW (ref 8.9–10.3)
Chloride: 103 mmol/L (ref 101–111)
Creatinine, Ser: 0.83 mg/dL (ref 0.44–1.00)
GFR calc Af Amer: >60 mL/min (ref >60)
Glucose, Bld: 109 mg/dL — ABNORMAL HIGH (ref 65–99)
POTASSIUM: 3.1 mmol/L — AB (ref 3.5–5.1)
SODIUM: 141 mmol/L (ref 135–145)

## 2016-05-23 LAB — CBC WITH DIFFERENTIAL/PLATELET
Basophils Absolute: 0 10*3/uL (ref 0.0–0.1)
Basophils Relative: 0 %
EOS ABS: 0.1 10*3/uL (ref 0.0–0.7)
EOS PCT: 2 %
HCT: 40.6 % (ref 36.0–46.0)
Hemoglobin: 13.8 g/dL (ref 12.0–15.0)
LYMPHS PCT: 30 %
Lymphs Abs: 2.9 10*3/uL (ref 0.7–4.0)
MCH: 30.6 pg (ref 26.0–34.0)
MCHC: 34 g/dL (ref 30.0–36.0)
MCV: 90 fL (ref 78.0–100.0)
Monocytes Absolute: 0.8 10*3/uL (ref 0.1–1.0)
Monocytes Relative: 9 %
NEUTROS PCT: 59 %
Neutro Abs: 5.7 10*3/uL (ref 1.7–7.7)
PLATELETS: 184 10*3/uL (ref 150–400)
RBC: 4.51 MIL/uL (ref 3.87–5.11)
RDW: 13.9 % (ref 11.5–15.5)
WBC: 9.6 10*3/uL (ref 4.0–10.5)

## 2016-05-23 LAB — SEDIMENTATION RATE: Sed Rate: 23 mm/hr — ABNORMAL HIGH (ref 0–22)

## 2016-05-23 LAB — BRAIN NATRIURETIC PEPTIDE: B Natriuretic Peptide: 80.1 pg/mL (ref 0.0–100.0)

## 2016-05-23 LAB — LACTIC ACID, PLASMA: Lactic Acid, Venous: 1.1 mmol/L (ref 0.5–1.9)

## 2016-05-23 MED ORDER — LORATADINE 10 MG PO TABS
10.0000 mg | ORAL_TABLET | Freq: Every day | ORAL | Status: DC
  Administered 2016-05-24 – 2016-05-29 (×6): 10 mg via ORAL
  Filled 2016-05-23 (×6): qty 1

## 2016-05-23 MED ORDER — METHOCARBAMOL 500 MG PO TABS
250.0000 mg | ORAL_TABLET | Freq: Two times a day (BID) | ORAL | Status: DC | PRN
  Administered 2016-05-24 – 2016-05-25 (×2): 500 mg via ORAL
  Administered 2016-05-25: 250 mg via ORAL
  Administered 2016-05-26 – 2016-05-29 (×7): 500 mg via ORAL
  Filled 2016-05-23 (×12): qty 1

## 2016-05-23 MED ORDER — VANCOMYCIN HCL IN DEXTROSE 1-5 GM/200ML-% IV SOLN
1000.0000 mg | Freq: Two times a day (BID) | INTRAVENOUS | Status: DC
  Administered 2016-05-23 – 2016-05-25 (×5): 1000 mg via INTRAVENOUS
  Filled 2016-05-23 (×5): qty 200

## 2016-05-23 MED ORDER — ASPIRIN EC 81 MG PO TBEC
81.0000 mg | DELAYED_RELEASE_TABLET | Freq: Every day | ORAL | Status: DC
  Administered 2016-05-23 – 2016-05-28 (×6): 81 mg via ORAL
  Filled 2016-05-23 (×6): qty 1

## 2016-05-23 MED ORDER — RISAQUAD PO CAPS
1.0000 | ORAL_CAPSULE | Freq: Every day | ORAL | Status: DC
  Administered 2016-05-24 – 2016-05-29 (×6): 1 via ORAL
  Filled 2016-05-23 (×6): qty 1

## 2016-05-23 MED ORDER — VANCOMYCIN HCL IN DEXTROSE 1-5 GM/200ML-% IV SOLN
1000.0000 mg | Freq: Once | INTRAVENOUS | Status: AC
  Administered 2016-05-23: 1000 mg via INTRAVENOUS
  Filled 2016-05-23: qty 200

## 2016-05-23 MED ORDER — ACETAMINOPHEN 325 MG PO TABS
650.0000 mg | ORAL_TABLET | Freq: Four times a day (QID) | ORAL | Status: DC | PRN
  Administered 2016-05-26: 650 mg via ORAL
  Filled 2016-05-23: qty 2

## 2016-05-23 MED ORDER — PRAMIPEXOLE DIHYDROCHLORIDE 0.25 MG PO TABS
0.3750 mg | ORAL_TABLET | Freq: Two times a day (BID) | ORAL | Status: DC | PRN
  Administered 2016-05-23: 0.375 mg via ORAL
  Filled 2016-05-23 (×2): qty 1

## 2016-05-23 MED ORDER — GABAPENTIN 100 MG PO CAPS
200.0000 mg | ORAL_CAPSULE | Freq: Every morning | ORAL | Status: DC
  Administered 2016-05-24 – 2016-05-29 (×6): 200 mg via ORAL
  Filled 2016-05-23 (×6): qty 2

## 2016-05-23 MED ORDER — ONDANSETRON 4 MG PO TBDP
4.0000 mg | ORAL_TABLET | Freq: Four times a day (QID) | ORAL | Status: DC | PRN
  Administered 2016-05-24 – 2016-05-29 (×7): 4 mg via ORAL
  Filled 2016-05-23 (×7): qty 1

## 2016-05-23 MED ORDER — HYDRALAZINE HCL 20 MG/ML IJ SOLN: 5.0000 mg | INTRAMUSCULAR | Status: DC | PRN

## 2016-05-23 MED ORDER — TRAZODONE HCL 100 MG PO TABS
100.0000 mg | ORAL_TABLET | Freq: Every evening | ORAL | Status: DC | PRN
Start: 2016-05-23 — End: 2016-05-29
  Administered 2016-05-24 – 2016-05-29 (×5): 100 mg via ORAL
  Filled 2016-05-23 (×5): qty 1

## 2016-05-23 MED ORDER — GABAPENTIN 100 MG PO CAPS: 100.0000 mg | ORAL_CAPSULE | Freq: Two times a day (BID) | ORAL | Status: DC

## 2016-05-23 MED ORDER — ESTRADIOL 0.1 MG/GM VA CREA
1.0000 | TOPICAL_CREAM | Freq: Every evening | VAGINAL | Status: DC | PRN
  Filled 2016-05-23: qty 42.5

## 2016-05-23 MED ORDER — ZOLPIDEM TARTRATE 5 MG PO TABS: 5.0000 mg | ORAL_TABLET | Freq: Every evening | ORAL | Status: DC | PRN

## 2016-05-23 MED ORDER — OXYCODONE-ACETAMINOPHEN 5-325 MG PO TABS
1.0000 | ORAL_TABLET | ORAL | Status: DC | PRN
  Administered 2016-05-23 – 2016-05-29 (×22): 1 via ORAL
  Filled 2016-05-23 (×22): qty 1

## 2016-05-23 MED ORDER — POLYETHYLENE GLYCOL 3350 17 G PO PACK
17.0000 g | PACK | Freq: Every day | ORAL | Status: DC | PRN
  Filled 2016-05-23: qty 1

## 2016-05-23 MED ORDER — HYDROXYZINE HCL 25 MG PO TABS: 50.0000 mg | ORAL_TABLET | Freq: Every evening | ORAL | Status: DC | PRN

## 2016-05-23 MED ORDER — POLYVINYL ALCOHOL 1.4 % OP SOLN
Freq: Three times a day (TID) | OPHTHALMIC | Status: DC | PRN
  Filled 2016-05-23: qty 15

## 2016-05-23 MED ORDER — POTASSIUM CHLORIDE 20 MEQ/15ML (10%) PO SOLN
40.0000 meq | Freq: Once | ORAL | Status: AC
  Administered 2016-05-23: 40 meq via ORAL
  Filled 2016-05-23: qty 30

## 2016-05-23 MED ORDER — CITALOPRAM HYDROBROMIDE 10 MG PO TABS
20.0000 mg | ORAL_TABLET | Freq: Every day | ORAL | Status: DC
  Administered 2016-05-23 – 2016-05-28 (×6): 20 mg via ORAL
  Filled 2016-05-23 (×6): qty 2

## 2016-05-23 MED ORDER — GABAPENTIN 300 MG PO CAPS
300.0000 mg | ORAL_CAPSULE | Freq: Every day | ORAL | Status: DC
  Administered 2016-05-23 – 2016-05-28 (×6): 300 mg via ORAL
  Filled 2016-05-23 (×6): qty 1

## 2016-05-23 MED ORDER — SENNOSIDES-DOCUSATE SODIUM 8.6-50 MG PO TABS: 1.0000 | ORAL_TABLET | Freq: Every evening | ORAL | Status: DC | PRN

## 2016-05-23 MED ORDER — ENOXAPARIN SODIUM 40 MG/0.4ML ~~LOC~~ SOLN
40.0000 mg | SUBCUTANEOUS | Status: DC
  Administered 2016-05-23 – 2016-05-28 (×6): 40 mg via SUBCUTANEOUS
  Filled 2016-05-23 (×6): qty 0.4

## 2016-05-23 MED ORDER — SPIRONOLACTONE 25 MG PO TABS
25.0000 mg | ORAL_TABLET | Freq: Every evening | ORAL | Status: DC
  Administered 2016-05-24 – 2016-05-25 (×2): 25 mg via ORAL
  Filled 2016-05-23 (×2): qty 1

## 2016-05-23 NOTE — Progress Notes (Signed)
*  PRELIMINARY RESULTS* Vascular Ultrasound Right lower extremity venous duplex has been completed.  Preliminary findings: No evidence of deep vein thrombosis in the visualized veins of the right lower extremity.  Somewhat difficult due to pitting edema. Negative for baker's cyst. Preliminary results called to [address] @ 19:06.  Everrett Coombe 05/23/2016, 7:03 PM

## 2016-05-23 NOTE — ED Notes (Signed)
Bed: WA08 Expected date:  Expected time:  Means of arrival:  Comments: 76 yo female cellulitis/dvt?

## 2016-05-23 NOTE — Progress Notes (Signed)
Pharmacy Antibiotic Follow-up Note  Denise Kelly is a 76 y.o. year-old female admitted on 05/23/2016.  The patient is currently on day 1 of Vancomycin  for cellulitis.  Assessment/Plan: Vancomycin 1gm IV every 12 hours.  Goal trough 10-15 mcg/mL.  Temp (24hrs), Avg:98.3 F (36.8 C), Min:98.3 F (36.8 C), Max:98.3 F (36.8 C)  No results for input(s): WBC in the last 168 hours.  Invalid input(s):  CREATININE No results for input(s): CREATININE in the last 168 hours. Estimated Creatinine Clearance: 72.9 mL/min (by C-G formula based on SCr of 0.79 mg/dL).    Allergies  Allergen Reactions  . Sulfa Antibiotics Hives, Swelling and Other (See Comments)    Reaction:  Facial/eye swelling   . Ciprofloxacin Itching  . Motrin [Ibuprofen] Palpitations   Antimicrobials this admission: 3/9 Vancomycin  >>   Levels/dose changes this admission:  Microbiology results: No Cx ordered  Thank you for allowing pharmacy to be a part of this patient's care.  Minda Ditto PharmD 05/23/2016 5:45 PM

## 2016-05-23 NOTE — H&P (Addendum)
History and Physical    Denise Kelly WPY:[id number] DOB: 02/18/1941 DOA: 05/23/2016  Referring MD/NP/PA:   PCP: Jonathon Bellows, MD   Patient coming from:  The patient is coming from home.  At baseline, pt is independent for most of ADL.   Chief Complaint: right lower leg pain  HPI: Denise Kelly is a 76 y.o. female with medical history significant of hypertension, GERD, depression, hearing loss, skin cancer, former alcohol abuser, chronic back pain, who presents with right lower leg pain.  Pt states that she has had ongoing cellulitis in the right lower extremity for the past several months since 02/2016. She has been taking intermittent antibiotics as prescribed by her primary care doctor. She has been hospitalized as well in the past for cellulitis. She had been taking Keflex over the past week or so, and was switched to doxycycline by her primary care provider on Wednesday, but no improvement. She states that her right lower leg pian and swelling are getting worse. It is constant, 6 out of 10 in severity, nonradiating. It is not aggravated or alleviated by any factors. It is also erythematous. Patient denies fever, chills. She has nausea, but no vomiting, abdominal pain or diarrhea. No chest pain, shortness of breath, coughing, symptoms of UTI or unilateral weakness. She states that her PCP sent her here to rule out DVT.    ED Course: pt was found to have WBC 9.6, potassium 3.1, creatinine normal, temperature normal, no tachycardia, oxygen saturation 94% on room air. Lower extremity Doppler for right leg is negative for DVT per preliminary report. Pt is placed on tele bed for obs.  Review of Systems:   General: no fevers, chills, no changes in body weight, has fatigue HEENT: no blurry vision, hearing changes or sore throat Respiratory: no dyspnea, coughing, wheezing CV: no chest pain, no palpitations GI: has nausea, no vomiting, abdominal pain, diarrhea, constipation GU: no dysuria,  burning on urination, increased urinary frequency, hematuria  Ext: has leg edema and right leg pain Neuro: no unilateral weakness, numbness, or tingling, no vision change or hearing loss Skin: no rash, no skin tear. MSK: No muscle spasm, no deformity, no limitation of range of movement in spin Heme: No easy bruising.  Travel history: No recent long distant travel.  Allergy:  Allergies  Allergen Reactions  . Sulfa Antibiotics Hives, Swelling and Other (See Comments)    Reaction:  Facial/eye swelling   . Ciprofloxacin Itching  . Motrin [Ibuprofen] Palpitations    Past Medical History:  Diagnosis Date  . Alcohol abuse    ETOH and xanax  . Anxiety    Panic attacks   . Arthritis   . Cancer ([place])    skin- basal , squamous , melonoma- right hand  . Cellulitis    right leg  . Complication of anesthesia 2009   "felt drunk for a week after"- AVM- both times- felt drunk  . Depression   . Encephalopathy   . GERD (gastroesophageal reflux disease)   . HOH (hard of hearing)   . HOH (hard of hearing)   . Hypertension    not on mediacations  . Palpitations   . UTI (urinary tract infection)    frequently    Past Surgical History:  Procedure Laterality Date  . BREAST SURGERY Right    2 breast biopsies  . 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 COLONOSCOPY  2011   polyps  . EYE SURGERY Bilateral  cataracts  . INTRAMEDULLARY (IM) NAIL INTERTROCHANTERIC Left 11/29/2015   Procedure: LEFT HIP   NAIL;  Surgeon: Renette Butters, MD;  Location: [facility];  Service: Orthopedics;  Laterality: Left;  . JOINT REPLACEMENT Left 09/2009   knee  . TONSILLECTOMY    . TOTAL KNEE ARTHROPLASTY Right 01/02/2014   Procedure: TOTAL KNEE ARTHROPLASTY;  Surgeon: [name], MD;  Location: [facility];  Service: Orthopedics;  Laterality: Right;  . TUBAL LIGATION      Social History:  reports that she has quit smoking. She quit after 20.00 years of use. She has never used smokeless tobacco. She  reports that she does not drink alcohol or use drugs.  Family History:  Family History  Problem Relation Age of Onset  . Hypertension Other   . Cancer Mother   . Cancer Father      Prior to Admission medications   Medication Sig Start Date End Date Taking? Authorizing Provider  acidophilus (RISAQUAD) CAPS capsule Take 1 capsule by mouth daily.   Yes Historical Provider, MD  aspirin EC 81 MG tablet Take 81 mg by mouth at bedtime.    Yes Historical Provider, MD  citalopram (CELEXA) 20 MG tablet Take 1 tablet (20 mg total) by mouth at bedtime. 02/24/14  Yes [name], MD  doxycycline (MONODOX) 100 MG capsule Take 100 mg by mouth 2 (two) times daily.  05/20/16  Yes Historical Provider, MD  estradiol (ESTRACE) 0.1 MG/GM vaginal cream Place 1 Applicatorful vaginally at bedtime as needed (for vaginal discomfort).   Yes Historical Provider, MD  gabapentin (NEURONTIN) 100 MG capsule Take 100-300 mg by mouth 2 (two) times daily. 200 mg in the morning & 300 mg at bedtime   Yes Historical Provider, MD  hydrOXYzine (ATARAX/VISTARIL) 25 MG tablet Take 50 mg by mouth at bedtime as needed for anxiety (and/or sleep).    Yes Historical Provider, MD  loratadine (CLARITIN) 10 MG tablet Take 10 mg by mouth daily.    Yes Historical Provider, MD  ondansetron (ZOFRAN-ODT) 4 MG disintegrating tablet Take 4 mg by mouth every 6 (six) hours as needed for nausea or vomiting.    Yes Historical Provider, MD  oxyCODONE (OXY IR/ROXICODONE) 5 MG immediate release tablet Take 1 tablet (5 mg total) by mouth every 4 (four) hours as needed for severe pain. 02/28/16  Yes Eugenie Filler, MD  Polyethyl Glycol-Propyl Glycol (SYSTANE OP) Apply 1-2 drops to eye 3 (three) times daily as needed (for dry eyes).   Yes Historical Provider, MD  polyethylene glycol (MIRALAX / GLYCOLAX) packet Take 17 g by mouth daily.   Yes Historical Provider, MD  spironolactone (ALDACTONE) 25 MG tablet Take 25 mg by mouth every evening.    Yes Historical  Provider, MD  traZODone (DESYREL) 100 MG tablet Take 100 mg by mouth at bedtime as needed for sleep.   Yes Historical Provider, MD  zolpidem (AMBIEN) 10 MG tablet Take 5 mg by mouth at bedtime as needed for sleep.   Yes Historical Provider, MD  acetaminophen (TYLENOL) 500 MG tablet Take 1,000 mg by mouth every 6 (six) hours as needed for mild pain, moderate pain or headache.     Historical Provider, MD  folic acid (FOLVITE) 1 MG tablet Take 1 tablet (1 mg total) by mouth daily. Patient not taking: Reported on 05/07/2016 02/29/16   Eugenie Filler, MD  ipratropium-albuterol (DUONEB) 0.5-2.5 (3) MG/3ML SOLN Take 3 mLs by nebulization every 4 (four) hours as needed. Patient not taking:  Reported on 05/07/2016 02/28/16   Eugenie Filler, MD  methocarbamol (ROBAXIN) 500 MG tablet Take 250-500 mg by mouth 2 (two) times daily as needed (for leg cramps/spasms.).     Historical Provider, MD  pramipexole (MIRAPEX) 0.125 MG tablet Take 0.375 mg by mouth 2 (two) times daily as needed (for restless leg syndrome).     Historical Provider, MD  senna-docusate (SENOKOT S) 8.6-50 MG tablet Take 1 tablet by mouth 2 (two) times daily. Patient not taking: Reported on 05/07/2016 02/28/16   Eugenie Filler, MD  thiamine 100 MG tablet Take 1 tablet (100 mg total) by mouth daily. Patient not taking: Reported on 05/07/2016 02/29/16   Eugenie Filler, MD  traMADol (ULTRAM) 50 MG tablet Take 1 tablet (50 mg total) by mouth every 6 (six) hours as needed for moderate pain. Patient not taking: Reported on 05/23/2016 02/28/16   Eugenie Filler, MD    Physical Exam: Vitals:   05/23/16 1710 05/23/16 1715 05/23/16 1716 05/23/16 1927  BP:  122/76 122/76 131/85  Pulse:   88 94  Resp:   18 20  Temp:   98.3 F (36.8 C)   TempSrc:   Oral   SpO2: 96%  94% 96%  Weight:   98.4 kg (217 lb)   Height:   '5\' 6"'  (1.676 m)    General: Not in acute distress HEENT:       Eyes: PERRL, EOMI, no scleral icterus.       ENT: No  discharge from the ears and nose, no pharynx injection, no tonsillar enlargement.        Neck: No JVD, no bruit, no mass felt. Heme: No neck lymph node enlargement. Cardiac: S1/S2, RRR, No murmurs, No gallops or rubs. Respiratory: No rales, wheezing, rhonchi or rubs. GI: Soft, nondistended, nontender, no rebound pain, no organomegaly, BS present. GU: No hematuria Ext: pt has bilateral lower leg edema, 2+ in left leg. Right lower leg is edematous (3+), warm, erythematous and tender. There is erythema tracking from the right foot up to the knee.  2+DP/PT pulse bilaterally. Musculoskeletal: No joint deformities, No joint redness or warmth, no limitation of ROM in spin. Skin: No rashes.  Neuro: Alert, oriented X3, cranial nerves II-XII grossly intact, moves all extremities normally.  Psych: Patient is not psychotic, no suicidal or hemocidal ideation.  Labs on Admission: I have personally reviewed following labs and imaging studies  CBC:  Recent Labs Lab 05/23/16 1755  WBC 9.6  NEUTROABS 5.7  HGB 13.8  HCT 40.6  MCV 90.0  PLT 154   Basic Metabolic Panel:  Recent Labs Lab 05/23/16 1755  NA 141  K 3.1*  CL 103  CO2 33*  GLUCOSE 109*  BUN 10  CREATININE 0.83  CALCIUM 8.6*   GFR: Estimated Creatinine Clearance: 70.3 mL/min (by C-G formula based on SCr of 0.83 mg/dL). Liver Function Tests: No results for input(s): AST, ALT, ALKPHOS, BILITOT, PROT, ALBUMIN in the last 168 hours. No results for input(s): LIPASE, AMYLASE in the last 168 hours. No results for input(s): AMMONIA in the last 168 hours. Coagulation Profile: No results for input(s): INR, PROTIME in the last 168 hours. Cardiac Enzymes: No results for input(s): CKTOTAL, CKMB, CKMBINDEX, TROPONINI in the last 168 hours. BNP (last 3 results) No results for input(s): PROBNP in the last 8760 hours. HbA1C: No results for input(s): HGBA1C in the last 72 hours. CBG: No results for input(s): GLUCAP in the last 168  hours. Lipid Profile: No  results for input(s): CHOL, HDL, LDLCALC, TRIG, CHOLHDL, LDLDIRECT in the last 72 hours. Thyroid Function Tests: No results for input(s): TSH, T4TOTAL, FREET4, T3FREE, THYROIDAB in the last 72 hours. Anemia Panel: No results for input(s): VITAMINB12, FOLATE, FERRITIN, TIBC, IRON, RETICCTPCT in the last 72 hours. Urine analysis:    Component Value Date/Time   COLORURINE STRAW (A) 02/27/2016 1339   APPEARANCEUR CLEAR 02/27/2016 1339   LABSPEC 1.006 02/27/2016 1339   PHURINE 9.0 (H) 02/27/2016 1339   GLUCOSEU NEGATIVE 02/27/2016 1339   HGBUR NEGATIVE 02/27/2016 1339   BILIRUBINUR NEGATIVE 02/27/2016 1339   KETONESUR NEGATIVE 02/27/2016 1339   PROTEINUR NEGATIVE 02/27/2016 1339   UROBILINOGEN 1.0 11/30/2014 2105   NITRITE NEGATIVE 02/27/2016 1339   LEUKOCYTESUR NEGATIVE 02/27/2016 1339   Sepsis Labs: '@LABRCNTIP' (procalcitonin:4,lacticidven:4) ) Recent Results (from the past 240 hour(s))  Surgical pcr screen     Status: None   Collection Time: 05/15/16  3:19 PM  Result Value Ref Range Status   MRSA, PCR NEGATIVE NEGATIVE Final   Staphylococcus aureus NEGATIVE NEGATIVE Final    Comment:        The Xpert SA Assay (FDA approved for NASAL specimens in patients over 24 years of age), is one component of a comprehensive surveillance program.  Test performance has been validated by [facility] for patients greater than or equal to 57 year old. It is not intended to diagnose infection nor to guide or monitor treatment.      Radiological Exams on Admission: No results found.   EKG: Not done in ED, will get one.   Assessment/Plan Principal Problem:   Cellulitis of right lower leg Active Problems:   Essential hypertension, benign   Alcohol dependence (HCC)   Hypokalemia   Depression   Cellulitis of right lower leg: pt's right leg is erythematous, swelling, warm and tenderness, consistence with cellulitis. Patient failed outpatient oral antibiotic  treatment. No fever or leukocytosis, clinically nonseptic. Hemodynamically stable.   - will place on med-surg bed for obs - Empiric antimicrobial treatment with vancomycin IV - PRN Zofran for nausea, and Percocet for pain - Blood cultures x 2  - ESR and CRP - check lactic acid-->if elevated, will hold diuretics and start IV fluid. - leg elevation  HTN: -continue spironolactone -IV hydralazine when necessary  Hypokalemia: K=3.1 on admission. - Repleted - Check Mg level  Depression: Stable, no suicidal or homicidal ideations. -Continue home medications: Celexa  hx of Alcohol dependence ([place]): pt states that she did not drink alcohol for 2 years. -observe   Chronic back pain: -prn percocet -continue neurontin  Bilateral leg edema: Patient does not have history of congestive heart failure. 2-D echo on 02/26/16 showed EF 60-65%. Patient is on spironolactone, daily 25 or 50 mg alternatively per patient. Etiology is not clear, questionable lymphedema? -f/u lactic acid level, if normal-->will start lasix for a few days. -check BNP  Addendum: pt's lactic acid is 1.1. Pt is not septic. -will start lasix 20 mg daily   DVT ppx: SQ Lovenox Code Status: Full code Family Communication: Yes, patient's daughter at bed side Disposition Plan:  Anticipate discharge back to previous home environment Consults called:  none Admission status: medical floor/obs  Date of Service 05/23/2016    Ivor Costa [facility] Pager [phone]  If 7PM-7AM, please contact night-coverage [email or web address] Password [facility] 05/23/2016, 7:49 PM

## 2016-05-23 NOTE — ED Provider Notes (Signed)
[facility] DEPT Provider Note   CSN: [id number] Arrival date & time: 05/23/16  1703     History   Chief Complaint Chief Complaint  Patient presents with  . Leg Pain    HPI Denise Kelly is a 76 y.o. female.  Patient presents to the emergency department with chief complaint of right lower extremity swelling, redness, and pain. She states that she has had ongoing cellulitis in the right lower extremity for the past several months. She has been taking intermittent antibiotics as prescribed by her primary care doctor. She has been hospitalized as well in the past for cellulitis. She had been taking Keflex over the past week or so, and was switched to doxycycline by her primary care provider. She reports that she has had progressively worsening symptoms since then. She denies any fevers, chills, cough, shortness of breath, or chest pain. She states that her PCP sent her here to rule out DVT. There are no other associated symptoms or modifying factors.   The history is provided by the patient. No language interpreter was used.    Past Medical History:  Diagnosis Date  . Alcohol abuse    ETOH and xanax  . Anxiety    Panic attacks   . Arthritis   . Cancer ([place])    skin- basal , squamous , melonoma- right hand  . Cellulitis    right leg  . Complication of anesthesia 2009   "felt drunk for a week after"- AVM- both times- felt drunk  . Depression   . Encephalopathy   . GERD (gastroesophageal reflux disease)   . HOH (hard of hearing)   . HOH (hard of hearing)   . Hypertension    not on mediacations  . Palpitations   . UTI (urinary tract infection)    frequently    Patient Active Problem List   Diagnosis Date Noted  . Cellulitis of right leg   . Cellulitis 02/25/2016  . Chronic back pain 02/25/2016  . Hypoxia 02/25/2016  . Lesion of liver 02/25/2016  . Acute encephalopathy 01/23/2016  . Back pain   . Urinary tract infection without hematuria 01/22/2016  . Hip  fracture ([place]) 11/28/2015  . Closed left hip fracture ([place]) 11/28/2015  . Dysuria 11/28/2015  . Fall 11/28/2015  . RLS (restless legs syndrome) 11/28/2015  . Altered mental state 11/30/2014  . Hypokalemia 03/14/2014  . Confusion 03/14/2014  . UTI (lower urinary tract infection) 03/14/2014  . Diarrhea 03/14/2014  . Effusion of right knee 02/22/2014  . Alcohol dependence ([place]) 02/22/2014  . Substance induced mood disorder ([place]) 02/22/2014  . Hepatic encephalopathy ([place])   . Altered mental status 02/21/2014  . SIRS (systemic inflammatory response syndrome) ([place]) 02/21/2014  . Complicated UTI (urinary tract infection)   . E coli bacteremia   . Sepsis due to Escherichia coli ([place])   . Screen for STD (sexually transmitted disease)   . S/P total knee arthroplasty 01/02/2014  . Right knee pain 11/10/2013  . Essential hypertension, benign 11/10/2013  . LAFB (left anterior fascicular block) 11/10/2013  . Obesity, unspecified 11/10/2013    Past Surgical History:  Procedure Laterality Date  . BREAST SURGERY Right    2 breast biopsies  . 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 COLONOSCOPY  2011   polyps  . EYE SURGERY Bilateral    cataracts  . INTRAMEDULLARY (IM) NAIL INTERTROCHANTERIC Left 11/29/2015   Procedure: LEFT HIP   NAIL;  Surgeon: Ernesta Amble  Percell Miller, MD;  Location: [facility];  Service: Orthopedics;  Laterality: Left;  . JOINT REPLACEMENT Left 09/2009   knee  . TONSILLECTOMY    . TOTAL KNEE ARTHROPLASTY Right 01/02/2014   Procedure: TOTAL KNEE ARTHROPLASTY;  Surgeon: [name], MD;  Location: [facility];  Service: Orthopedics;  Laterality: Right;  . TUBAL LIGATION      OB History    No data available       Home Medications    Prior to Admission medications   Medication Sig Start Date End Date Taking? Authorizing Provider  acidophilus (RISAQUAD) CAPS capsule Take 1 capsule by mouth daily.   Yes Historical Provider, MD  aspirin EC 81 MG tablet Take 81 mg by mouth  at bedtime.    Yes Historical Provider, MD  citalopram (CELEXA) 20 MG tablet Take 1 tablet (20 mg total) by mouth at bedtime. 02/24/14  Yes [name], MD  doxycycline (MONODOX) 100 MG capsule Take 100 mg by mouth 2 (two) times daily.  05/20/16  Yes Historical Provider, MD  estradiol (ESTRACE) 0.1 MG/GM vaginal cream Place 1 Applicatorful vaginally at bedtime as needed (for vaginal discomfort).   Yes Historical Provider, MD  gabapentin (NEURONTIN) 100 MG capsule Take 100-300 mg by mouth 2 (two) times daily. 200 mg in the morning & 300 mg at bedtime   Yes Historical Provider, MD  hydrOXYzine (ATARAX/VISTARIL) 25 MG tablet Take 50 mg by mouth at bedtime as needed for anxiety (and/or sleep).    Yes Historical Provider, MD  loratadine (CLARITIN) 10 MG tablet Take 10 mg by mouth daily.    Yes Historical Provider, MD  ondansetron (ZOFRAN-ODT) 4 MG disintegrating tablet Take 4 mg by mouth every 6 (six) hours as needed for nausea or vomiting.    Yes Historical Provider, MD  oxyCODONE (OXY IR/ROXICODONE) 5 MG immediate release tablet Take 1 tablet (5 mg total) by mouth every 4 (four) hours as needed for severe pain. 02/28/16  Yes Eugenie Filler, MD  Polyethyl Glycol-Propyl Glycol (SYSTANE OP) Apply 1-2 drops to eye 3 (three) times daily as needed (for dry eyes).   Yes Historical Provider, MD  polyethylene glycol (MIRALAX / GLYCOLAX) packet Take 17 g by mouth daily.   Yes Historical Provider, MD  spironolactone (ALDACTONE) 25 MG tablet Take 25 mg by mouth every evening.    Yes Historical Provider, MD  traZODone (DESYREL) 100 MG tablet Take 100 mg by mouth at bedtime as needed for sleep.   Yes Historical Provider, MD  zolpidem (AMBIEN) 10 MG tablet Take 5 mg by mouth at bedtime as needed for sleep.   Yes Historical Provider, MD  acetaminophen (TYLENOL) 500 MG tablet Take 1,000 mg by mouth every 6 (six) hours as needed for mild pain, moderate pain or headache.     Historical Provider, MD  folic acid (FOLVITE) 1  MG tablet Take 1 tablet (1 mg total) by mouth daily. Patient not taking: Reported on 05/07/2016 02/29/16   Eugenie Filler, MD  ipratropium-albuterol (DUONEB) 0.5-2.5 (3) MG/3ML SOLN Take 3 mLs by nebulization every 4 (four) hours as needed. Patient not taking: Reported on 05/07/2016 02/28/16   Eugenie Filler, MD  methocarbamol (ROBAXIN) 500 MG tablet Take 250-500 mg by mouth 2 (two) times daily as needed (for leg cramps/spasms.).     Historical Provider, MD  pramipexole (MIRAPEX) 0.125 MG tablet Take 0.375 mg by mouth 2 (two) times daily as needed (for restless leg syndrome).     Historical Provider, MD  senna-docusate (SENOKOT S) 8.6-50 MG tablet Take 1 tablet by mouth 2 (two) times daily. Patient not taking: Reported on 05/07/2016 02/28/16   Eugenie Filler, MD  thiamine 100 MG tablet Take 1 tablet (100 mg total) by mouth daily. Patient not taking: Reported on 05/07/2016 02/29/16   Eugenie Filler, MD  traMADol (ULTRAM) 50 MG tablet Take 1 tablet (50 mg total) by mouth every 6 (six) hours as needed for moderate pain. Patient not taking: Reported on 05/23/2016 02/28/16   Eugenie Filler, MD    Family History Family History  Problem Relation Age of Onset  . Hypertension Other   . Cancer Mother   . Cancer Father     Social History Social History  Substance Use Topics  . Smoking status: Former Smoker    Years: 20.00  . Smokeless tobacco: Never Used     Comment: quit smoking many years ago .  quit 1994  . Alcohol use No     Comment: unable to get alcohol per family not since September     Allergies   Sulfa antibiotics; Ciprofloxacin; and Motrin [ibuprofen]   Review of Systems Review of Systems  Respiratory: Negative for cough and shortness of breath.   Cardiovascular: Positive for leg swelling. Negative for chest pain.  Skin: Positive for color change.  All other systems reviewed and are negative.    Physical Exam Updated Vital Signs BP 122/76 (BP Location: Left  Arm)   Pulse 88   Temp 98.3 F (36.8 C) (Oral)   Resp 18   Ht 5\' 6"  (1.676 m)   Wt 98.4 kg   SpO2 94%   BMI 35.02 kg/m   Physical Exam  Constitutional: She is oriented to person, place, and time. She appears well-developed and well-nourished.  HENT:  Head: Normocephalic and atraumatic.  Eyes: Conjunctivae and EOM are normal. Pupils are equal, round, and reactive to light.  Neck: Normal range of motion. Neck supple.  Cardiovascular: Normal rate and regular rhythm.  Exam reveals no gallop and no friction rub.   No murmur heard. Pulmonary/Chest: Effort normal and breath sounds normal. No respiratory distress. She has no wheezes. She has no rales. She exhibits no tenderness.  2+ pitting edema right lower extremity  Abdominal: Soft. Bowel sounds are normal. She exhibits no distension and no mass. There is no tenderness. There is no rebound and no guarding.  Musculoskeletal: Normal range of motion. She exhibits no edema or tenderness.  Neurological: She is alert and oriented to person, place, and time.  Lower extremity sensation and strength is intact  Skin: Skin is warm and dry.  Right lower extremity is edematous, and erythematous, there is erythema tracking from the right foot up to the knee, there is no obvious abscess  Psychiatric: She has a normal mood and affect. Her behavior is normal. Judgment and thought content normal.  Nursing note and vitals reviewed.    ED Treatments / Results  Labs (all labs ordered are listed, but only abnormal results are displayed) Labs Reviewed  CBC WITH DIFFERENTIAL/PLATELET  BASIC METABOLIC PANEL    EKG  EKG Interpretation None       Radiology No results found.  Procedures Procedures (including critical care time)  Medications Ordered in ED Medications - No data to display   Initial Impression / Assessment and Plan / ED Course  I have reviewed the triage vital signs and the nursing notes.  Pertinent labs & imaging results  that were available during my  care of the patient were reviewed by me and considered in my medical decision making (see chart for details).     Patient with concern for worsening cellulitis. She has been taking antibiotics, but has recently switched from Keflex to doxycycline due to worsening symptoms. She states that her symptoms have still continued to worsen. Afebrile, vital signs are stable.  Patient with cellulitis, failing outpatient there appear. She does not have a fever, and does not have an elevated leukocytosis. She is not septic, but the cellulitis does appear to be worsening. DVT study ordered in the ED, and is negative.  Appreciate Dr. Blaine Hamper for watching the patient in the hospital.  Final Clinical Impressions(s) / ED Diagnoses   Final diagnoses:  Cellulitis of right lower extremity    New Prescriptions New Prescriptions   No medications on file     Montine Circle, PA-C 05/23/16 1926    Virgel Manifold, MD 06/03/16 1423

## 2016-05-23 NOTE — ED Triage Notes (Signed)
Per EMS, patient is presenting with cellulitis on right lower extremity. This is an on-going issue and patient gets put on antibiotics/cellulitis goes away. Patient's PCP sent her here for blood clot evaluation.

## 2016-05-23 NOTE — Progress Notes (Signed)
RN received report from ED, Pt arrived unit accompanied by family. Alert and oriented, able to communicate needs. Will continue with current plan of care.

## 2016-05-24 ENCOUNTER — Observation Stay (HOSPITAL_COMMUNITY): Payer: Medicare Other

## 2016-05-24 DIAGNOSIS — L03115 Cellulitis of right lower limb: Secondary | ICD-10-CM | POA: Diagnosis present

## 2016-05-24 DIAGNOSIS — Z8249 Family history of ischemic heart disease and other diseases of the circulatory system: Secondary | ICD-10-CM | POA: Diagnosis not present

## 2016-05-24 DIAGNOSIS — M1990 Unspecified osteoarthritis, unspecified site: Secondary | ICD-10-CM | POA: Diagnosis present

## 2016-05-24 DIAGNOSIS — Z79899 Other long term (current) drug therapy: Secondary | ICD-10-CM | POA: Diagnosis not present

## 2016-05-24 DIAGNOSIS — G894 Chronic pain syndrome: Secondary | ICD-10-CM | POA: Diagnosis present

## 2016-05-24 DIAGNOSIS — F329 Major depressive disorder, single episode, unspecified: Secondary | ICD-10-CM | POA: Diagnosis present

## 2016-05-24 DIAGNOSIS — M549 Dorsalgia, unspecified: Secondary | ICD-10-CM | POA: Diagnosis present

## 2016-05-24 DIAGNOSIS — E876 Hypokalemia: Secondary | ICD-10-CM | POA: Diagnosis present

## 2016-05-24 DIAGNOSIS — Z7982 Long term (current) use of aspirin: Secondary | ICD-10-CM | POA: Diagnosis not present

## 2016-05-24 DIAGNOSIS — Z882 Allergy status to sulfonamides status: Secondary | ICD-10-CM | POA: Diagnosis not present

## 2016-05-24 DIAGNOSIS — R600 Localized edema: Secondary | ICD-10-CM | POA: Diagnosis present

## 2016-05-24 DIAGNOSIS — H9190 Unspecified hearing loss, unspecified ear: Secondary | ICD-10-CM | POA: Diagnosis present

## 2016-05-24 DIAGNOSIS — Z96653 Presence of artificial knee joint, bilateral: Secondary | ICD-10-CM | POA: Diagnosis present

## 2016-05-24 DIAGNOSIS — Z881 Allergy status to other antibiotic agents status: Secondary | ICD-10-CM | POA: Diagnosis not present

## 2016-05-24 DIAGNOSIS — Z87891 Personal history of nicotine dependence: Secondary | ICD-10-CM | POA: Diagnosis not present

## 2016-05-24 DIAGNOSIS — Z809 Family history of malignant neoplasm, unspecified: Secondary | ICD-10-CM | POA: Diagnosis not present

## 2016-05-24 DIAGNOSIS — Z886 Allergy status to analgesic agent status: Secondary | ICD-10-CM | POA: Diagnosis not present

## 2016-05-24 DIAGNOSIS — Z96651 Presence of right artificial knee joint: Secondary | ICD-10-CM | POA: Diagnosis not present

## 2016-05-24 DIAGNOSIS — I10 Essential (primary) hypertension: Secondary | ICD-10-CM | POA: Diagnosis present

## 2016-05-24 DIAGNOSIS — B9689 Other specified bacterial agents as the cause of diseases classified elsewhere: Secondary | ICD-10-CM | POA: Diagnosis not present

## 2016-05-24 DIAGNOSIS — F1021 Alcohol dependence, in remission: Secondary | ICD-10-CM | POA: Diagnosis present

## 2016-05-24 DIAGNOSIS — I872 Venous insufficiency (chronic) (peripheral): Secondary | ICD-10-CM | POA: Diagnosis present

## 2016-05-24 LAB — BASIC METABOLIC PANEL
Anion gap: 5 (ref 5–15)
BUN: 7 mg/dL (ref 6–20)
CALCIUM: 8.4 mg/dL — AB (ref 8.9–10.3)
CO2: 32 mmol/L (ref 22–32)
CREATININE: 0.71 mg/dL (ref 0.44–1.00)
Chloride: 103 mmol/L (ref 101–111)
GFR calc non Af Amer: >60 mL/min (ref >60)
Glucose, Bld: 104 mg/dL — ABNORMAL HIGH (ref 65–99)
Potassium: 3.3 mmol/L — ABNORMAL LOW (ref 3.5–5.1)
SODIUM: 140 mmol/L (ref 135–145)

## 2016-05-24 LAB — CBC
HCT: 38.1 % (ref 36.0–46.0)
Hemoglobin: 12.8 g/dL (ref 12.0–15.0)
MCH: 29.7 pg (ref 26.0–34.0)
MCHC: 33.6 g/dL (ref 30.0–36.0)
MCV: 88.4 fL (ref 78.0–100.0)
PLATELETS: 193 10*3/uL (ref 150–400)
RBC: 4.31 MIL/uL (ref 3.87–5.11)
RDW: 13.9 % (ref 11.5–15.5)
WBC: 7.4 10*3/uL (ref 4.0–10.5)

## 2016-05-24 LAB — C-REACTIVE PROTEIN: CRP: 1 mg/dL — ABNORMAL HIGH (ref <1.0)

## 2016-05-24 LAB — MAGNESIUM: MAGNESIUM: 1.8 mg/dL (ref 1.7–2.4)

## 2016-05-24 MED ORDER — POTASSIUM CHLORIDE CRYS ER 20 MEQ PO TBCR
40.0000 meq | EXTENDED_RELEASE_TABLET | Freq: Once | ORAL | Status: AC
  Administered 2016-05-24: 40 meq via ORAL
  Filled 2016-05-24: qty 2

## 2016-05-24 MED ORDER — POTASSIUM CHLORIDE 20 MEQ/15ML (10%) PO SOLN
40.0000 meq | Freq: Once | ORAL | Status: AC
  Administered 2016-05-24: 40 meq via ORAL
  Filled 2016-05-24: qty 30

## 2016-05-24 MED ORDER — DEXTROSE 5 % IV SOLN
1.0000 g | INTRAVENOUS | Status: DC
  Administered 2016-05-24 – 2016-05-25 (×2): 1 g via INTRAVENOUS
  Filled 2016-05-24 (×2): qty 10

## 2016-05-24 MED ORDER — GADOBENATE DIMEGLUMINE 529 MG/ML IV SOLN: 20.0000 mL | Freq: Once | INTRAVENOUS | Status: DC | PRN

## 2016-05-24 MED ORDER — FUROSEMIDE 20 MG PO TABS
20.0000 mg | ORAL_TABLET | Freq: Every day | ORAL | Status: DC
  Administered 2016-05-24 – 2016-05-29 (×7): 20 mg via ORAL
  Filled 2016-05-24 (×7): qty 1

## 2016-05-24 NOTE — Progress Notes (Signed)
PROGRESS NOTE  Denise Kelly XFG:[id number] DOB: 10/04/1940 DOA: 05/23/2016 PCP: Jonathon Bellows, MD   LOS: 0 days   Brief Narrative: Denise Kelly is a 76 y.o. female with medical history significant of hypertension, GERD, depression, hearing loss, skin cancer, former alcohol abuser, chronic back pain, who presents with right lower leg pain, found to have cellulitis and admitted for IV antibiotics   Assessment & Plan: Principal Problem:   Cellulitis of right lower leg Active Problems:   Essential hypertension, benign   Alcohol dependence (HCC)   Hypokalemia   Depression   RLE cellulitis -continue IV antibiotics, Vancomycin, add Ceftriaxone.  -Patient with intermittent cellulitis over the last 3-4 months, on multiple course of oral antibiotics, which improved the cellulitis temporarily but once she stops antibiotics it comes back.  -We will obtain an MRI to rule out deeper infection -She has several cats at home and has occasional scratches from the cath, will need to minimize this to minimize skin disruption and introducing bacteria under the skin -She has chronic lower extremity swelling, she has no evidence of heart failure most recent echo showed normal EF, probably has a degree of venous insufficiency, recommended long-term compression stockings and keep her feet elevated every time she sits down -She failed Keflex as she completed a week's course as an outpatient prior to hospitalization -We will likely need a longer course of antibiotics this time  HTN -continue spironolactone -IV hydralazine when necessary -BP controlled this morning 115/67  Hypokalemia - Repleted - Mg OK at 1.8  Depression -Continue home meds  hx of Alcohol dependence ([place]), in remission  -pt states that she did not drink alcohol for 2 years.  Chronic back pain -prn percocet -continue neurontin  Bilateral leg edema - BNP normal, prior echo in December 2017 with normal EF, probably has a  degree of venous insufficiency, recommen compression stockings once cellulitis is improvedd    DVT prophylaxis: Lovenox Code Status: Full code Family Communication: d/w daughter over the phone Disposition Plan: home when ready   Consultants:   None   Procedures:   None   Antimicrobials:  Vancomycin 3/9 >>  Ceftriaxone 3/10 >>  Subjective: - somewhat frustrated today due to recurrent cellulitis  Objective: Vitals:   05/23/16 1716 05/23/16 1927 05/23/16 2056 05/24/16 0404  BP: 122/76 131/85 (!) 161/87 115/67  Pulse: 88 94 96 80  Resp: 18 20 18 20   Temp: 98.3 F (36.8 C)  98.3 F (36.8 C) 98 F (36.7 C)  TempSrc: Oral  Oral Oral  SpO2: 94% 96% 97% 93%  Weight: 98.4 kg (217 lb)     Height: 5\' 6"  (1.676 m)       Intake/Output Summary (Last 24 hours) at 05/24/16 1308 Last data filed at 05/24/16 0135  Gross per 24 hour  Intake              240 ml  Output                0 ml  Net              240 ml   Filed Weights   05/23/16 1716  Weight: 98.4 kg (217 lb)    Examination: Constitutional: NAD Vitals:   05/23/16 1716 05/23/16 1927 05/23/16 2056 05/24/16 0404  BP: 122/76 131/85 (!) 161/87 115/67  Pulse: 88 94 96 80  Resp: 18 20 18 20   Temp: 98.3 F (36.8 C)  98.3 F (36.8 C) 98 F (36.7 C)  TempSrc: Oral  Oral Oral  SpO2: 94% 96% 97% 93%  Weight: 98.4 kg (217 lb)     Height: 5\' 6"  (1.676 m)      Eyes: PERRL, lids and conjunctivae normal Respiratory: clear to auscultation bilaterally, no wheezing, no crackles.  Cardiovascular: Regular rate and rhythm, no murmurs / rubs / gallops. 1+ LE edema Abdomen: no tenderness. Bowel sounds positive.  Musculoskeletal: no clubbing / cyanosis Skin: cellulitic rash rle anterior shin, foot Neurologic: CN 2-12 grossly intact. Strength 5/5 in all 4.   Data Reviewed: I have personally reviewed following labs and imaging studies  CBC:  Recent Labs Lab 05/23/16 1755 05/24/16 0645  WBC 9.6 7.4  NEUTROABS 5.7  --     HGB 13.8 12.8  HCT 40.6 38.1  MCV 90.0 88.4  PLT 184 784   Basic Metabolic Panel:  Recent Labs Lab 05/23/16 1755 05/24/16 0645  NA 141 140  K 3.1* 3.3*  CL 103 103  CO2 33* 32  GLUCOSE 109* 104*  BUN 10 7  CREATININE 0.83 0.71  CALCIUM 8.6* 8.4*  MG  --  1.8   GFR: Estimated Creatinine Clearance: 72.9 mL/min (by C-G formula based on SCr of 0.71 mg/dL). Liver Function Tests: No results for input(s): AST, ALT, ALKPHOS, BILITOT, PROT, ALBUMIN in the last 168 hours. No results for input(s): LIPASE, AMYLASE in the last 168 hours. No results for input(s): AMMONIA in the last 168 hours. Coagulation Profile: No results for input(s): INR, PROTIME in the last 168 hours. Cardiac Enzymes: No results for input(s): CKTOTAL, CKMB, CKMBINDEX, TROPONINI in the last 168 hours. BNP (last 3 results) No results for input(s): PROBNP in the last 8760 hours. HbA1C: No results for input(s): HGBA1C in the last 72 hours. CBG: No results for input(s): GLUCAP in the last 168 hours. Lipid Profile: No results for input(s): CHOL, HDL, LDLCALC, TRIG, CHOLHDL, LDLDIRECT in the last 72 hours. Thyroid Function Tests: No results for input(s): TSH, T4TOTAL, FREET4, T3FREE, THYROIDAB in the last 72 hours. Anemia Panel: No results for input(s): VITAMINB12, FOLATE, FERRITIN, TIBC, IRON, RETICCTPCT in the last 72 hours. Urine analysis:    Component Value Date/Time   COLORURINE STRAW (A) 02/27/2016 1339   APPEARANCEUR CLEAR 02/27/2016 1339   LABSPEC 1.006 02/27/2016 1339   PHURINE 9.0 (H) 02/27/2016 1339   GLUCOSEU NEGATIVE 02/27/2016 1339   HGBUR NEGATIVE 02/27/2016 1339   BILIRUBINUR NEGATIVE 02/27/2016 1339   KETONESUR NEGATIVE 02/27/2016 1339   PROTEINUR NEGATIVE 02/27/2016 1339   UROBILINOGEN 1.0 11/30/2014 2105   NITRITE NEGATIVE 02/27/2016 1339   LEUKOCYTESUR NEGATIVE 02/27/2016 1339   Sepsis Labs: Invalid input(s): PROCALCITONIN, LACTICIDVEN  Recent Results (from the past 240 hour(s))   Surgical pcr screen     Status: None   Collection Time: 05/15/16  3:19 PM  Result Value Ref Range Status   MRSA, PCR NEGATIVE NEGATIVE Final   Staphylococcus aureus NEGATIVE NEGATIVE Final    Comment:        The Xpert SA Assay (FDA approved for NASAL specimens in patients over 20 years of age), is one component of a comprehensive surveillance program.  Test performance has been validated by [facility] for patients greater than or equal to 50 year old. It is not intended to diagnose infection nor to guide or monitor treatment.       Radiology Studies: No results found.   Scheduled Meds: . acidophilus  1 capsule Oral Daily  . aspirin EC  81 mg Oral QHS  .  citalopram  20 mg Oral QHS  . enoxaparin (LOVENOX) injection  40 mg Subcutaneous Q24H  . furosemide  20 mg Oral Daily  . gabapentin  200 mg Oral q morning - 10a  . gabapentin  300 mg Oral QHS  . loratadine  10 mg Oral Daily  . spironolactone  25 mg Oral QPM  . vancomycin  1,000 mg Intravenous Q12H   Continuous Infusions:   Marzetta Board, MD, PhD [facility] Pager [phone]  If 7PM-7AM, please contact night-coverage [email or web address] Password [email or web address] 05/24/2016, 1:08 PM

## 2016-05-25 MED ORDER — CALCIUM CARBONATE ANTACID 500 MG PO CHEW
200.0000 mg | CHEWABLE_TABLET | Freq: Three times a day (TID) | ORAL | Status: DC | PRN
  Administered 2016-05-25: 200 mg via ORAL
  Filled 2016-05-25: qty 1

## 2016-05-25 NOTE — Progress Notes (Signed)
PROGRESS NOTE  Denise Kelly DOB: 10/09/1940 DOA: 05/23/2016 PCP: Jonathon Bellows, MD   LOS: 1 day   Brief Narrative: Denise Kelly is a 76 y.o. female with medical history significant of hypertension, GERD, depression, hearing loss, skin cancer, former alcohol abuser, chronic back pain, who presents with right lower leg pain, found to have cellulitis and admitted for IV antibiotics   Assessment & Plan: Principal Problem:   Cellulitis of right lower leg Active Problems:   Essential hypertension, benign   Alcohol dependence (HCC)   Hypokalemia   Depression   RLE cellulitis -continue IV antibiotics, Vancomycin, added Ceftriaxone on 3/10 -Patient with intermittent cellulitis over the last 3-4 months, on multiple course of oral antibiotics, which improved the cellulitis temporarily but once she stops antibiotics it comes back.  -Obtain an MRI to rule out deeper infection on 3/10, and this was negative -She has several cats at home and has occasional scratches from the cath, will need to minimize this to minimize skin disruption and introducing bacteria under the skin -She has chronic lower extremity swelling, she has no evidence of heart failure most recent echo showed normal EF, probably has a degree of venous insufficiency, recommended long-term compression stockings and keep her feet elevated every time she sits down -She failed Keflex as she completed a week's course as an outpatient prior to hospitalization -We will likely need a longer course of antibiotics this time -Slight improvement on the dorsum aspect of the foot, however anterior shin looks about the same.  She is to continue with IV antibiotics for today  HTN -continue spironolactone -IV hydralazine when necessary -BP controlled this morning 145/90  Hypokalemia - Repleted - Mg OK at 1.8 -Repeat BMP tomorrow morning  Depression -Continue home meds  hx of Alcohol dependence ([place]), in remission    -pt states that she did not drink alcohol for 2 years.  Chronic back pain -prn percocet -continue neurontin  Bilateral leg edema - BNP normal, prior echo in December 2017 with normal EF, probably has a degree of venous insufficiency, recommen compression stockings once cellulitis is improvedd    DVT prophylaxis: Lovenox Code Status: Full code Family Communication: d/w daughter over the phone Disposition Plan: home when ready   Consultants:   None   Procedures:   None   Antimicrobials:  Vancomycin 3/9 >>  Ceftriaxone 3/10 >>  Subjective: -No complaints this morning, appreciate cellulitic area looking about the same  Objective: Vitals:   05/24/16 0404 05/24/16 1406 05/24/16 2033 05/25/16 0400  BP: 115/67 (!) 143/80 124/73 (!) 145/90  Pulse: 80 83 85 86  Resp: 20 18 18    Temp: 98 F (36.7 C) 97.7 F (36.5 C) 98.1 F (36.7 C) 98 F (36.7 C)  TempSrc: Oral Tympanic Axillary Axillary  SpO2: 93% 99% 95% 94%  Weight:      Height:        Intake/Output Summary (Last 24 hours) at 05/25/16 1046 Last data filed at 05/25/16 0833  Gross per 24 hour  Intake             1070 ml  Output             1301 ml  Net             -231 ml   Filed Weights   05/23/16 1716  Weight: 98.4 kg (217 lb)    Examination: Constitutional: NAD Vitals:   05/24/16 0404 05/24/16 1406 05/24/16 2033 05/25/16 0400  BP:  115/67 (!) 143/80 124/73 (!) 145/90  Pulse: 80 83 85 86  Resp: 20 18 18    Temp: 98 F (36.7 C) 97.7 F (36.5 C) 98.1 F (36.7 C) 98 F (36.7 C)  TempSrc: Oral Tympanic Axillary Axillary  SpO2: 93% 99% 95% 94%  Weight:      Height:       Eyes: PERRL, lids and conjunctivae normal Respiratory: clear to auscultation bilaterally, no wheezing, no crackles.  Cardiovascular: Regular rate and rhythm, no murmurs / rubs / gallops. 1+ LE edema Abdomen: no tenderness. Bowel sounds positive.  Musculoskeletal: no clubbing / cyanosis Skin: cellulitic rash rle anterior shin,  foot Neurologic: CN 2-12 grossly intact. Strength 5/5 in all 4.   Data Reviewed: I have personally reviewed following labs and imaging studies  CBC:  Recent Labs Lab 05/23/16 1755 05/24/16 0645  WBC 9.6 7.4  NEUTROABS 5.7  --   HGB 13.8 12.8  HCT 40.6 38.1  MCV 90.0 88.4  PLT 184 702   Basic Metabolic Panel:  Recent Labs Lab 05/23/16 1755 05/24/16 0645  NA 141 140  K 3.1* 3.3*  CL 103 103  CO2 33* 32  GLUCOSE 109* 104*  BUN 10 7  CREATININE 0.83 0.71  CALCIUM 8.6* 8.4*  MG  --  1.8   GFR: Estimated Creatinine Clearance: 72.9 mL/min (by C-G formula based on SCr of 0.71 mg/dL). Liver Function Tests: No results for input(s): AST, ALT, ALKPHOS, BILITOT, PROT, ALBUMIN in the last 168 hours. No results for input(s): LIPASE, AMYLASE in the last 168 hours. No results for input(s): AMMONIA in the last 168 hours. Coagulation Profile: No results for input(s): INR, PROTIME in the last 168 hours. Cardiac Enzymes: No results for input(s): CKTOTAL, CKMB, CKMBINDEX, TROPONINI in the last 168 hours. BNP (last 3 results) No results for input(s): PROBNP in the last 8760 hours. HbA1C: No results for input(s): HGBA1C in the last 72 hours. CBG: No results for input(s): GLUCAP in the last 168 hours. Lipid Profile: No results for input(s): CHOL, HDL, LDLCALC, TRIG, CHOLHDL, LDLDIRECT in the last 72 hours. Thyroid Function Tests: No results for input(s): TSH, T4TOTAL, FREET4, T3FREE, THYROIDAB in the last 72 hours. Anemia Panel: No results for input(s): VITAMINB12, FOLATE, FERRITIN, TIBC, IRON, RETICCTPCT in the last 72 hours. Urine analysis:    Component Value Date/Time   COLORURINE STRAW (A) 02/27/2016 1339   APPEARANCEUR CLEAR 02/27/2016 1339   LABSPEC 1.006 02/27/2016 1339   PHURINE 9.0 (H) 02/27/2016 1339   GLUCOSEU NEGATIVE 02/27/2016 1339   HGBUR NEGATIVE 02/27/2016 1339   BILIRUBINUR NEGATIVE 02/27/2016 1339   KETONESUR NEGATIVE 02/27/2016 1339   PROTEINUR NEGATIVE  02/27/2016 1339   UROBILINOGEN 1.0 11/30/2014 2105   NITRITE NEGATIVE 02/27/2016 1339   LEUKOCYTESUR NEGATIVE 02/27/2016 1339   Sepsis Labs: Invalid input(s): PROCALCITONIN, LACTICIDVEN  Recent Results (from the past 240 hour(s))  Surgical pcr screen     Status: None   Collection Time: 05/15/16  3:19 PM  Result Value Ref Range Status   MRSA, PCR NEGATIVE NEGATIVE Final   Staphylococcus aureus NEGATIVE NEGATIVE Final    Comment:        The Xpert SA Assay (FDA approved for NASAL specimens in patients over 49 years of age), is one component of a comprehensive surveillance program.  Test performance has been validated by [facility] for patients greater than or equal to 73 year old. It is not intended to diagnose infection nor to guide or monitor treatment.  Radiology Studies: Mr Tibia Fibula Right Wo Contrast  Result Date: 05/24/2016 CLINICAL DATA:  Bilateral leg pain.  Cellulitis. EXAM: MRI OF LOWER RIGHT EXTREMITY WITHOUT CONTRAST TECHNIQUE: Multiplanar, multisequence MR imaging of the right lower extremity was performed. No intravenous contrast was administered. COMPARISON:  None. FINDINGS: Bones/Joint/Cartilage Bilateral total knee arthroplasties with susceptibility artifact partially obscuring the adjacent soft tissue and osseous structures. No periarticular fluid collection or osteolysis. No focal marrow signal abnormality. No fracture or dislocation. Normal alignment. No periosteal reaction or bone destruction. Ligaments, Muscles and Tendons Muscles are normal. No muscle edema. No intramuscular fluid collection. Soft tissue No fluid collection or hematoma. No soft tissue mass. Asymmetric mild soft tissue edema and skin thickening of the right lower leg which may be secondary to mild cellulitis. IMPRESSION: 1. No osseous abnormality of the right tibia or fibula. 2. Asymmetric mild soft tissue edema and skin thickening of the right lower leg which may be secondary to mild  cellulitis. No focal fluid collection to suggest an abscess. Electronically Signed   By: Kathreen Devoid   On: 05/24/2016 14:11     Scheduled Meds: . acidophilus  1 capsule Oral Daily  . aspirin EC  81 mg Oral QHS  . cefTRIAXone (ROCEPHIN)  IV  1 g Intravenous Q24H  . citalopram  20 mg Oral QHS  . enoxaparin (LOVENOX) injection  40 mg Subcutaneous Q24H  . furosemide  20 mg Oral Daily  . gabapentin  200 mg Oral q morning - 10a  . gabapentin  300 mg Oral QHS  . loratadine  10 mg Oral Daily  . spironolactone  25 mg Oral QPM  . vancomycin  1,000 mg Intravenous Q12H   Continuous Infusions:   Marzetta Board, MD, PhD [facility] Pager [phone]  If 7PM-7AM, please contact night-coverage [email or web address] Password [email or web address] 05/25/2016, 10:46 AM

## 2016-05-25 NOTE — Progress Notes (Signed)
Rt. Lower extremity edematous and reddened. Rt. Leg elevated, 2t PP.

## 2016-05-25 NOTE — Progress Notes (Signed)
Rt. LE remains edematous. The redness appears lighter at top of  the shin.

## 2016-05-26 DIAGNOSIS — B9689 Other specified bacterial agents as the cause of diseases classified elsewhere: Secondary | ICD-10-CM

## 2016-05-26 DIAGNOSIS — Z881 Allergy status to other antibiotic agents status: Secondary | ICD-10-CM

## 2016-05-26 DIAGNOSIS — I872 Venous insufficiency (chronic) (peripheral): Secondary | ICD-10-CM

## 2016-05-26 DIAGNOSIS — Z886 Allergy status to analgesic agent status: Secondary | ICD-10-CM

## 2016-05-26 DIAGNOSIS — Z8249 Family history of ischemic heart disease and other diseases of the circulatory system: Secondary | ICD-10-CM

## 2016-05-26 DIAGNOSIS — Z809 Family history of malignant neoplasm, unspecified: Secondary | ICD-10-CM

## 2016-05-26 DIAGNOSIS — Z882 Allergy status to sulfonamides status: Secondary | ICD-10-CM

## 2016-05-26 DIAGNOSIS — Z87891 Personal history of nicotine dependence: Secondary | ICD-10-CM

## 2016-05-26 LAB — BASIC METABOLIC PANEL
Anion gap: 6 (ref 5–15)
BUN: 9 mg/dL (ref 6–20)
CHLORIDE: 106 mmol/L (ref 101–111)
CO2: 32 mmol/L (ref 22–32)
Calcium: 8.8 mg/dL — ABNORMAL LOW (ref 8.9–10.3)
Creatinine, Ser: 0.84 mg/dL (ref 0.44–1.00)
GFR calc Af Amer: >60 mL/min (ref >60)
GFR calc non Af Amer: >60 mL/min (ref >60)
GLUCOSE: 117 mg/dL — AB (ref 65–99)
POTASSIUM: 3.6 mmol/L (ref 3.5–5.1)
Sodium: 144 mmol/L (ref 135–145)

## 2016-05-26 LAB — VANCOMYCIN, TROUGH: Vancomycin Tr: 18 ug/mL (ref 15–20)

## 2016-05-26 MED ORDER — VANCOMYCIN HCL IN DEXTROSE 750-5 MG/150ML-% IV SOLN
750.0000 mg | Freq: Two times a day (BID) | INTRAVENOUS | Status: DC
  Administered 2016-05-26: 750 mg via INTRAVENOUS
  Filled 2016-05-26 (×2): qty 150

## 2016-05-26 MED ORDER — CEFAZOLIN IN D5W 1 GM/50ML IV SOLN
1.0000 g | Freq: Three times a day (TID) | INTRAVENOUS | Status: DC
Start: 2016-05-26 — End: 2016-05-29
  Administered 2016-05-26 – 2016-05-29 (×8): 1 g via INTRAVENOUS
  Filled 2016-05-26 (×10): qty 50

## 2016-05-26 MED ORDER — PIPERACILLIN-TAZOBACTAM 3.375 G IVPB
3.3750 g | Freq: Three times a day (TID) | INTRAVENOUS | Status: DC
Start: 2016-05-26 — End: 2016-05-26
  Administered 2016-05-26 (×2): 3.375 g via INTRAVENOUS
  Filled 2016-05-26 (×4): qty 50

## 2016-05-26 NOTE — Progress Notes (Signed)
PROGRESS NOTE  Denise Kelly FXT:[id number] DOB: Jun 04, 1940 DOA: 05/23/2016 PCP: Jonathon Bellows, MD   LOS: 2 days   Brief Narrative: Denise Kelly is a 76 y.o. female with medical history significant of hypertension, GERD, depression, hearing loss, skin cancer, former alcohol abuser, chronic back pain, who presents with right lower leg pain, found to have cellulitis and admitted for IV antibiotics   Assessment & Plan: Principal Problem:   Cellulitis of right lower leg Active Problems:   Essential hypertension, benign   Alcohol dependence (HCC)   Hypokalemia   Depression   RLE cellulitis -Patient with intermittent cellulitis over the last 3-4 months, on multiple course of oral antibiotics, which improved the cellulitis temporarily but once she stops antibiotics it comes back.  -Obtained an MRI to rule out deeper infection on 3/10, and this was negative -She has several cats at home and has occasional scratches from the cath, will need to minimize this to minimize skin disruption and introducing bacteria under the skin -She has chronic lower extremity swelling, she has no evidence of heart failure most recent echo showed normal EF, probably has a degree of venous insufficiency, recommended long-term compression stockings and keep her feet elevated every time she sits down -She failed Keflex as she completed a week's course as an outpatient prior to hospitalization -not really improving on vancomycin and ceftriaxone, his dorsum of the foot appears to have gotten a little bit better however the large area on the anterior shin still looks quite erythematous.  I will change ceftriaxone to Zosyn to have anaerobic coverage, however I am not sure that this would be likely to be a pathogen. -Consulted infectious disease, discussed with Dr. Johnnye Sima, appreciate input  HTN -continue spironolactone -IV hydralazine when necessary -BP controlled this morning 145/90  Hypokalemia -Repleted -Mg OK  at 1.8  Depression -Continue home meds  hx of Alcohol dependence ([place]), in remission  -pt states that she did not drink alcohol for 2 years.  Chronic back pain -prn percocet -continue neurontin  Bilateral leg edema - BNP normal, prior echo in December 2017 with normal EF, probably has a degree of venous insufficiency, recommen compression stockings once cellulitis is improvedd    DVT prophylaxis: Lovenox Code Status: Full code Family Communication: d/w daughter at bedside Disposition Plan: home when ready   Consultants:   Infectious disease  Procedures:   None   Antimicrobials:  Vancomycin 3/9 >>  Ceftriaxone 3/10 >> 3/12  Zosyn 3/12 >>  Subjective: -No complaints this morning, appreciate cellulitic area looking about the same, no improvement  Objective: Vitals:   05/25/16 0400 05/25/16 1349 05/25/16 2156 05/26/16 0702  BP: (!) 145/90 100/70 129/83 122/77  Pulse: 86 69  68  Resp:  18  19  Temp: 98 F (36.7 C) 98.6 F (37 C) 99.6 F (37.6 C) 98.9 F (37.2 C)  TempSrc: Axillary Oral Oral Oral  SpO2: 94% 93% 94% 99%  Weight:      Height:        Intake/Output Summary (Last 24 hours) at 05/26/16 1228 Last data filed at 05/26/16 0900  Gross per 24 hour  Intake             1137 ml  Output                5 ml  Net             1132 ml   Filed Weights   05/23/16 1716  Weight: 98.4  kg (217 lb)    Examination: Constitutional: NAD Vitals:   05/25/16 0400 05/25/16 1349 05/25/16 2156 05/26/16 0702  BP: (!) 145/90 100/70 129/83 122/77  Pulse: 86 69  68  Resp:  18  19  Temp: 98 F (36.7 C) 98.6 F (37 C) 99.6 F (37.6 C) 98.9 F (37.2 C)  TempSrc: Axillary Oral Oral Oral  SpO2: 94% 93% 94% 99%  Weight:      Height:       Eyes: PERRL, lids and conjunctivae normal Respiratory: clear to auscultation bilaterally, no wheezing, no crackles.  Cardiovascular: Regular rate and rhythm, no murmurs / rubs / gallops. 1+ LE edema Abdomen: no tenderness.  Bowel sounds positive.  Musculoskeletal: no clubbing / cyanosis Skin: cellulitic rash rle anterior shin, foot Neurologic: CN 2-12 grossly intact. Strength 5/5 in all 4.   Data Reviewed: I have personally reviewed following labs and imaging studies  CBC:  Recent Labs Lab 05/23/16 1755 05/24/16 0645  WBC 9.6 7.4  NEUTROABS 5.7  --   HGB 13.8 12.8  HCT 40.6 38.1  MCV 90.0 88.4  PLT 184 353   Basic Metabolic Panel:  Recent Labs Lab 05/23/16 1755 05/24/16 0645 05/26/16 0529  NA 141 140 144  K 3.1* 3.3* 3.6  CL 103 103 106  CO2 33* 32 32  GLUCOSE 109* 104* 117*  BUN 10 7 9   CREATININE 0.83 0.71 0.84  CALCIUM 8.6* 8.4* 8.8*  MG  --  1.8  --    GFR: Estimated Creatinine Clearance: 69.5 mL/min (by C-G formula based on SCr of 0.84 mg/dL). Liver Function Tests: No results for input(s): AST, ALT, ALKPHOS, BILITOT, PROT, ALBUMIN in the last 168 hours. No results for input(s): LIPASE, AMYLASE in the last 168 hours. No results for input(s): AMMONIA in the last 168 hours. Coagulation Profile: No results for input(s): INR, PROTIME in the last 168 hours. Cardiac Enzymes: No results for input(s): CKTOTAL, CKMB, CKMBINDEX, TROPONINI in the last 168 hours. BNP (last 3 results) No results for input(s): PROBNP in the last 8760 hours. HbA1C: No results for input(s): HGBA1C in the last 72 hours. CBG: No results for input(s): GLUCAP in the last 168 hours. Lipid Profile: No results for input(s): CHOL, HDL, LDLCALC, TRIG, CHOLHDL, LDLDIRECT in the last 72 hours. Thyroid Function Tests: No results for input(s): TSH, T4TOTAL, FREET4, T3FREE, THYROIDAB in the last 72 hours. Anemia Panel: No results for input(s): VITAMINB12, FOLATE, FERRITIN, TIBC, IRON, RETICCTPCT in the last 72 hours. Urine analysis:    Component Value Date/Time   COLORURINE STRAW (A) 02/27/2016 1339   APPEARANCEUR CLEAR 02/27/2016 1339   LABSPEC 1.006 02/27/2016 1339   PHURINE 9.0 (H) 02/27/2016 1339   GLUCOSEU  NEGATIVE 02/27/2016 1339   HGBUR NEGATIVE 02/27/2016 1339   BILIRUBINUR NEGATIVE 02/27/2016 1339   KETONESUR NEGATIVE 02/27/2016 1339   PROTEINUR NEGATIVE 02/27/2016 1339   UROBILINOGEN 1.0 11/30/2014 2105   NITRITE NEGATIVE 02/27/2016 1339   LEUKOCYTESUR NEGATIVE 02/27/2016 1339   Sepsis Labs: Invalid input(s): PROCALCITONIN, LACTICIDVEN  Recent Results (from the past 240 hour(s))  Culture, blood (routine x 2)     Status: None (Preliminary result)   Collection Time: 05/23/16  8:30 PM  Result Value Ref Range Status   Specimen Description BLOOD RIGHT ANTECUBITAL  Final   Special Requests BOTTLES DRAWN AEROBIC AND ANAEROBIC 5 CC EA  Final   Culture   Final    NO GROWTH 1 DAY Performed at [facility], [address]  [place], [place] [address]    Report Status PENDING  Incomplete  Culture, blood (routine x 2)     Status: None (Preliminary result)   Collection Time: 05/23/16  8:46 PM  Result Value Ref Range Status   Specimen Description BLOOD RIGHT ANTECUBITAL  Final   Special Requests BOTTLES DRAWN AEROBIC AND ANAEROBIC 7 CC EA  Final   Culture   Final    NO GROWTH 1 DAY Performed at [facility], [place] [address], [place], [place] [address]    Report Status PENDING  Incomplete      Radiology Studies: Mr Tibia Fibula Right Wo Contrast  Result Date: 05/24/2016 CLINICAL DATA:  Bilateral leg pain.  Cellulitis. EXAM: MRI OF LOWER RIGHT EXTREMITY WITHOUT CONTRAST TECHNIQUE: Multiplanar, multisequence MR imaging of the right lower extremity was performed. No intravenous contrast was administered. COMPARISON:  None. FINDINGS: Bones/Joint/Cartilage Bilateral total knee arthroplasties with susceptibility artifact partially obscuring the adjacent soft tissue and osseous structures. No periarticular fluid collection or osteolysis. No focal marrow signal abnormality. No fracture or dislocation. Normal alignment. No periosteal reaction or bone destruction. Ligaments, Muscles and  Tendons Muscles are normal. No muscle edema. No intramuscular fluid collection. Soft tissue No fluid collection or hematoma. No soft tissue mass. Asymmetric mild soft tissue edema and skin thickening of the right lower leg which may be secondary to mild cellulitis. IMPRESSION: 1. No osseous abnormality of the right tibia or fibula. 2. Asymmetric mild soft tissue edema and skin thickening of the right lower leg which may be secondary to mild cellulitis. No focal fluid collection to suggest an abscess. Electronically Signed   By: Kathreen Devoid   On: 05/24/2016 14:11     Scheduled Meds: . acidophilus  1 capsule Oral Daily  . aspirin EC  81 mg Oral QHS  . citalopram  20 mg Oral QHS  . enoxaparin (LOVENOX) injection  40 mg Subcutaneous Q24H  . furosemide  20 mg Oral Daily  . gabapentin  200 mg Oral q morning - 10a  . gabapentin  300 mg Oral QHS  . loratadine  10 mg Oral Daily  . piperacillin-tazobactam (ZOSYN)  IV  3.375 g Intravenous Q8H  . spironolactone  25 mg Oral QPM  . vancomycin  750 mg Intravenous Q12H   Continuous Infusions:   Marzetta Board, MD, PhD [facility] Pager [phone]  If 7PM-7AM, please contact night-coverage [email or web address] Password [facility] 05/26/2016, 12:28 PM

## 2016-05-26 NOTE — Consult Note (Signed)
[name] for Infectious Disease  Date of Admission:  05/23/2016  Date of Consult:  05/26/2016  Reason for Consult:Cellulitis Referring Physician: Cruzita Lederer  Impression/Recommendation Cellulitis, recurrent Would change her anbx to ancef She needs to keep her leg elevated, she is not able to do this at home.  We spoke about her possibly going onto long term PEN or clinda as a suppressive.  Will hold on this til this episode is resolved.   Suspected Venous Insufficiency Keep leg elevated.    Thank you so much for this interesting consult,   [name] (pager) [phone] [email or web address]  Denise Kelly is an 76 y.o. female.  HPI: 76 yo F with hx of L THR (11-2015) and R TKR (12-2013).  She comes to ED on 3-9 with hx of RLE cellulitis present since 02-2016. She had been hospitalized at that time with cellulitis.  she has been seen by her PCP for this- prev given keflex, then switched to doxy last week.  She developed worsening pain, erythema, and swelling during this period.  In ED her WBC was normal, she was afebrile. U/s (-) for DVT. She was started on vancomycin, ceftriaxone, and lasix.  She underwent MRI on 3-10 which suggested mild cellulitis, no osteo or abscess.  Cardiac echo (02-2016) showed nl ef and nl wall motion.  Her WBC remains normal. She has been afebrile in the hospital.   She is awaiting back surgery (? Fused discs).   Past Medical History:  Diagnosis Date  . Alcohol abuse    ETOH and xanax  . Anxiety    Panic attacks   . Arthritis   . Cancer ([place])    skin- basal , squamous , melonoma- right hand  . Cellulitis    right leg  . Complication of anesthesia 2009   "felt drunk for a week after"- AVM- both times- felt drunk  . Depression   . Encephalopathy   . GERD (gastroesophageal reflux disease)   . HOH (hard of hearing)   . HOH (hard of hearing)   . Hypertension    not on mediacations  . Palpitations   . UTI (urinary tract  infection)    frequently    Past Surgical History:  Procedure Laterality Date  . BREAST SURGERY Right    2 breast biopsies  . 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 COLONOSCOPY  2011   polyps  . EYE SURGERY Bilateral    cataracts  . INTRAMEDULLARY (IM) NAIL INTERTROCHANTERIC Left 11/29/2015   Procedure: LEFT HIP   NAIL;  Surgeon: Renette Butters, MD;  Location: [facility];  Service: Orthopedics;  Laterality: Left;  . JOINT REPLACEMENT Left 09/2009   knee  . TONSILLECTOMY    . TOTAL KNEE ARTHROPLASTY Right 01/02/2014   Procedure: TOTAL KNEE ARTHROPLASTY;  Surgeon: [name], MD;  Location: [facility];  Service: Orthopedics;  Laterality: Right;  . TUBAL LIGATION       Allergies  Allergen Reactions  . Sulfa Antibiotics Hives, Swelling and Other (See Comments)    Reaction:  Facial/eye swelling   . Ciprofloxacin Itching  . Motrin [Ibuprofen] Palpitations    Medications:  Scheduled: . acidophilus  1 capsule Oral Daily  . aspirin EC  81 mg Oral QHS  . citalopram  20 mg Oral QHS  . enoxaparin (LOVENOX) injection  40 mg Subcutaneous Q24H  . furosemide  20 mg Oral Daily  . gabapentin  200 mg Oral q morning -  10a  . gabapentin  300 mg Oral QHS  . loratadine  10 mg Oral Daily  . piperacillin-tazobactam (ZOSYN)  IV  3.375 g Intravenous Q8H  . vancomycin  750 mg Intravenous Q12H    Abtx:  Anti-infectives    Start     Dose/Rate Route Frequency Ordered Stop   05/26/16 1200  vancomycin (VANCOCIN) IVPB 750 mg/150 ml premix     750 mg 150 mL/hr over 60 Minutes Intravenous Every 12 hours 05/26/16 1110     05/26/16 0800  piperacillin-tazobactam (ZOSYN) IVPB 3.375 g     3.375 g 12.5 mL/hr over 240 Minutes Intravenous Every 8 hours 05/26/16 0731     05/24/16 1600  cefTRIAXone (ROCEPHIN) 1 g in dextrose 5 % 50 mL IVPB  Status:  Discontinued     1 g 100 mL/hr over 30 Minutes Intravenous Every 24 hours 05/24/16 1309 05/26/16 0721   05/23/16 2200  vancomycin (VANCOCIN) IVPB 1000  mg/200 mL premix  Status:  Discontinued     1,000 mg 200 mL/hr over 60 Minutes Intravenous Every 12 hours 05/23/16 1954 05/26/16 1110   05/23/16 1800  vancomycin (VANCOCIN) IVPB 1000 mg/200 mL premix     1,000 mg 200 mL/hr over 60 Minutes Intravenous  Once 05/23/16 1747 05/23/16 1908      Total days of antibiotics: 2 vanco--> vanco/ceftriaxone//> vanco/zosyn          Social History:  reports that she has quit smoking. She quit after 20.00 years of use. She has never used smokeless tobacco. She reports that she does not drink alcohol or use drugs.  Family History  Problem Relation Age of Onset  . Hypertension Other   . Cancer Mother   . Cancer Father     General ROS: constipation. normal urine. no f/c. eating well, wt unchanged.   Please see HPI. 12 point ROS o/w (-)  Blood pressure 116/68, pulse 77, temperature 98.2 F (36.8 C), temperature source Oral, resp. rate 18, height '5\' 6"'$  (1.676 m), weight 98.4 kg (217 lb), SpO2 91 %. General appearance: alert, cooperative and no distress Eyes: negative findings: conjunctivae and sclerae normal and pupils equal, round, reactive to light and accomodation Throat: lips, mucosa, and tongue normal; teeth and gums normal Neck: no adenopathy and supple, symmetrical, trachea midline Lungs: clear to auscultation bilaterally Heart: regular rate and rhythm Abdomen: normal findings: bowel sounds normal and soft, non-tender Extremities: RLE swollen and warm between knee and ankle. erythematous. her knee is not hot or swollen.    Results for orders placed or performed during the hospital encounter of 05/23/16 (from the past 48 hour(s))  Basic metabolic panel     Status: Abnormal   Collection Time: 05/26/16  5:29 AM  Result Value Ref Range   Sodium 144 135 - 145 mmol/L   Potassium 3.6 3.5 - 5.1 mmol/L   Chloride 106 101 - 111 mmol/L   CO2 32 22 - 32 mmol/L   Glucose, Bld 117 (H) 65 - 99 mg/dL   BUN 9 6 - 20 mg/dL   Creatinine, Ser 0.84 0.44 -  1.00 mg/dL   Calcium 8.8 (L) 8.9 - 10.3 mg/dL   GFR calc non Af Amer >60 >60 mL/min   GFR calc Af Amer >60 >60 mL/min    Comment: (NOTE) The eGFR has been calculated using the CKD EPI equation. This calculation has not been validated in all clinical situations. eGFR's persistently <60 mL/min signify possible Chronic Kidney Disease.    Anion gap  6 5 - 15  Vancomycin, trough     Status: None   Collection Time: 05/26/16  8:56 AM  Result Value Ref Range   Vancomycin Tr 18 15 - 20 ug/mL      Component Value Date/Time   SDES BLOOD RIGHT ANTECUBITAL 05/23/2016 2046   SPECREQUEST BOTTLES DRAWN AEROBIC AND ANAEROBIC 7 CC EA 05/23/2016 2046   CULT  05/23/2016 2046    NO GROWTH 2 DAYS Performed at [facility], [place] [address], [place], [place] [address]    REPTSTATUS PENDING 05/23/2016 2046   No results found. Recent Results (from the past 240 hour(s))  Culture, blood (routine x 2)     Status: None (Preliminary result)   Collection Time: 05/23/16  8:30 PM  Result Value Ref Range Status   Specimen Description BLOOD RIGHT ANTECUBITAL  Final   Special Requests BOTTLES DRAWN AEROBIC AND ANAEROBIC 5 CC EA  Final   Culture   Final    NO GROWTH 2 DAYS Performed at [facility], [address] [place], [place] [address]    Report Status PENDING  Incomplete  Culture, blood (routine x 2)     Status: None (Preliminary result)   Collection Time: 05/23/16  8:46 PM  Result Value Ref Range Status   Specimen Description BLOOD RIGHT ANTECUBITAL  Final   Special Requests BOTTLES DRAWN AEROBIC AND ANAEROBIC 7 CC EA  Final   Culture   Final    NO GROWTH 2 DAYS Performed at [facility], [place] [address] [place], [place] [address]    Report Status PENDING  Incomplete      05/26/2016, 6:11 PM     LOS: 2 days    Records and images were personally reviewed where available.

## 2016-05-26 NOTE — Care Management Note (Signed)
Case Management Note  Patient Details  Name: Denise Kelly MRN: [id number] Date of Birth: 1941-01-10  Subjective/Objective:      Cellulitis of the lower leg              Action/Plan:Date:  May 26, 2016 Chart reviewed for concurrent status and case management needs. Will continue to follow patient progress. Discharge Planning: following for needs/ lives alone at home with dtg nearby. Expected discharge date: [id number] Velva Harman, BSN, [place], [place]   Expected Discharge Date:   (unknown)               Expected Discharge Plan:  Home/Self Care  In-House Referral:     Discharge planning Services     Post Acute Care Choice:    Choice offered to:     DME Arranged:    DME Agency:     HH Arranged:    [place] Agency:     Status of Service:  In process, will continue to follow  If discussed at Long Length of Stay Meetings, dates discussed:    Additional Comments:  Leeroy Cha, RN 05/26/2016, 11:40 AM

## 2016-05-26 NOTE — Progress Notes (Addendum)
Pharmacy Antibiotic Note  Denise Kelly is a 76 y.o. female admitted on 05/23/2016 with cellulitis.  Pharmacy has been consulted for Zosyn dosing.  Pharmacy has already been following for vancomycin dosing.  Therapy escalated today for unresolving cellulitis.  Plan: Zosyn 3.375g IV q8h (4 hour infusion time).  Check vancomycin trough this morning. VT goal 10-15 mcg/ml.  Addendum 11:08 AM 05/26/2016  Vancomycin trough = 18 mcg/ml with 1g q12h dosing Plan: Reduce vancomycin to 750 mg q12h for VT goal 10-15 mcg/ml.  Height: 5\' 6"  (167.6 cm) Weight: 217 lb (98.4 kg) IBW/kg (Calculated) : 59.3  Temp (24hrs), Avg:99 F (37.2 C), Min:98.6 F (37 C), Max:99.6 F (37.6 C)   Recent Labs Lab 05/23/16 1755 05/23/16 2043 05/24/16 0645 05/26/16 0529  WBC 9.6  --  7.4  --   CREATININE 0.83  --  0.71 0.84  LATICACIDVEN  --  1.1  --   --     Estimated Creatinine Clearance: 69.5 mL/min (by C-G formula based on SCr of 0.84 mg/dL).    Allergies  Allergen Reactions  . Sulfa Antibiotics Hives, Swelling and Other (See Comments)    Reaction:  Facial/eye swelling   . Ciprofloxacin Itching  . Motrin [Ibuprofen] Palpitations    Antimicrobials this admission: 3/9 Vancomycin  >>  3/10 Rocephin >> 3/12 3/12 Zosyn >>  Levels/dose changes this admission: 3/12 VT: 18 mcg/ml on 1g q12h dosing  Microbiology results: 3/9 BCx: ngtd 3/1 MRSA PCR: neg  Thank you for allowing pharmacy to be a part of this patient's care.  Hershal Coria 05/26/2016 7:32 AM

## 2016-05-27 DIAGNOSIS — M549 Dorsalgia, unspecified: Secondary | ICD-10-CM

## 2016-05-27 DIAGNOSIS — Z96651 Presence of right artificial knee joint: Secondary | ICD-10-CM

## 2016-05-27 DIAGNOSIS — G894 Chronic pain syndrome: Secondary | ICD-10-CM

## 2016-05-27 LAB — BASIC METABOLIC PANEL
ANION GAP: 5 (ref 5–15)
BUN: 11 mg/dL (ref 6–20)
CO2: 34 mmol/L — AB (ref 22–32)
Calcium: 8.6 mg/dL — ABNORMAL LOW (ref 8.9–10.3)
Chloride: 103 mmol/L (ref 101–111)
Creatinine, Ser: 0.91 mg/dL (ref 0.44–1.00)
GFR calc non Af Amer: >60 mL/min (ref >60)
Glucose, Bld: 104 mg/dL — ABNORMAL HIGH (ref 65–99)
Potassium: 3.8 mmol/L (ref 3.5–5.1)
Sodium: 142 mmol/L (ref 135–145)

## 2016-05-27 LAB — HEMOGLOBIN A1C
Hgb A1c MFr Bld: 5.2 % (ref 4.8–5.6)
Mean Plasma Glucose: 103 mg/dL

## 2016-05-27 NOTE — Progress Notes (Signed)
PROGRESS NOTE  CHERYLLYNN Kelly UEA:[id number] DOB: 26-May-1940 DOA: 05/23/2016 PCP: Jonathon Bellows, MD   LOS: 3 days   Brief Narrative: Denise Kelly is a 76 y.o. female with medical history significant of hypertension, GERD, depression, hearing loss, skin cancer, former alcohol abuser, chronic back pain, who presents with right lower leg pain, found to have cellulitis and admitted for IV antibiotics.  As her cellulitis was not improving as expected, and she has had on and off cellulitis for the past 3 months, infectious disease was consulted on 3/12  Assessment & Plan: Principal Problem:   Cellulitis of right lower leg Active Problems:   Essential hypertension, benign   Alcohol dependence (HCC)   Hypokalemia   Depression   RLE cellulitis -Patient with intermittent cellulitis over the last 3-4 months, on multiple course of oral antibiotics, which improved the cellulitis temporarily but once she stops antibiotics it comes back.  -Obtained an MRI to rule out deeper infection on 3/10, and this was negative -She has several cats at home and has occasional scratches from the cats, will need to minimize this to minimize skin disruption and introducing bacteria under the skin -She has chronic lower extremity swelling, she has no evidence of heart failure most recent echo showed normal EF, probably has a degree of venous insufficiency, recommended long-term compression stockings and keep her feet elevated every time she sits down -She failed Keflex as she completed a week's course as an outpatient prior to hospitalization -She was initially placed on vancomycin and ceftriaxone, not improving, was later placed on Zosyn and eventually infectious disease was consulted on 3/12.  She is currently on Ancef, continue.  Cellulitic area still looks deep red.  She may need suppressive antibiotics at home, defer to infectious disease  HTN -IV hydralazine when necessary -Patient was placed on Lasix to help  with lower extremity swelling, she will likely need a Lasix at home as as needed rather than standing, blood pressure is reasonably controlled today at 142/73  Hypokalemia -Repleted and now within normal limits -Mg OK at 1.8  Depression -Continue home meds  hx of Alcohol dependence ([place]), in remission  -pt states that she did not drink alcohol for 2 years.  Chronic back pain -prn percocet -continue neurontin  Bilateral leg edema - BNP normal, prior echo in December 2017 with normal EF, probably has a degree of venous insufficiency, recommen compression stockings once cellulitis is improvedd    DVT prophylaxis: Lovenox Code Status: Full code Family Communication: d/w daughter at bedside Disposition Plan: home when ready   Consultants:   Infectious disease  Procedures:   None   Antimicrobials:  Vancomycin 3/9 >>  Ceftriaxone 3/10 >> 3/12  Zosyn 3/12 >> 3/13  Ancef 3/13 >>  Subjective: -No complaints this morning, appreciate cellulitic area looking slightly improved  Objective: Vitals:   05/26/16 0702 05/26/16 1332 05/26/16 2016 05/27/16 0509  BP: 122/77 116/68 116/65 (!) 142/73  Pulse: 68 77 74 74  Resp: 19 18 18 18   Temp: 98.9 F (37.2 C) 98.2 F (36.8 C) 97.4 F (36.3 C) 97.9 F (36.6 C)  TempSrc: Oral Oral Oral Oral  SpO2: 99% 91% 92% 96%  Weight:      Height:        Intake/Output Summary (Last 24 hours) at 05/27/16 1048 Last data filed at 05/27/16 0300  Gross per 24 hour  Intake              530 ml  Output  0 ml  Net              530 ml   Filed Weights   05/23/16 1716  Weight: 98.4 kg (217 lb)    Examination: Constitutional: NAD Vitals:   05/26/16 0702 05/26/16 1332 05/26/16 2016 05/27/16 0509  BP: 122/77 116/68 116/65 (!) 142/73  Pulse: 68 77 74 74  Resp: 19 18 18 18   Temp: 98.9 F (37.2 C) 98.2 F (36.8 C) 97.4 F (36.3 C) 97.9 F (36.6 C)  TempSrc: Oral Oral Oral Oral  SpO2: 99% 91% 92% 96%  Weight:        Height:       Eyes: PERRL, lids and conjunctivae normal Respiratory: clear to auscultation bilaterally, no wheezing, no crackles.  Cardiovascular: Regular rate and rhythm, no murmurs / rubs / gallops. 1+ LE edema Abdomen: no tenderness. Bowel sounds positive.  Musculoskeletal: no clubbing / cyanosis Skin: cellulitic rash rle anterior shin, foot Neurologic: CN 2-12 grossly intact. Strength 5/5 in all 4.   Data Reviewed: I have personally reviewed following labs and imaging studies  CBC:  Recent Labs Lab 05/23/16 1755 05/24/16 0645  WBC 9.6 7.4  NEUTROABS 5.7  --   HGB 13.8 12.8  HCT 40.6 38.1  MCV 90.0 88.4  PLT 184 170   Basic Metabolic Panel:  Recent Labs Lab 05/23/16 1755 05/24/16 0645 05/26/16 0529 05/27/16 0631  NA 141 140 144 142  K 3.1* 3.3* 3.6 3.8  CL 103 103 106 103  CO2 33* 32 32 34*  GLUCOSE 109* 104* 117* 104*  BUN 10 7 9 11   CREATININE 0.83 0.71 0.84 0.91  CALCIUM 8.6* 8.4* 8.8* 8.6*  MG  --  1.8  --   --    GFR: Estimated Creatinine Clearance: 64.1 mL/min (by C-G formula based on SCr of 0.91 mg/dL). Liver Function Tests: No results for input(s): AST, ALT, ALKPHOS, BILITOT, PROT, ALBUMIN in the last 168 hours. No results for input(s): LIPASE, AMYLASE in the last 168 hours. No results for input(s): AMMONIA in the last 168 hours. Coagulation Profile: No results for input(s): INR, PROTIME in the last 168 hours. Cardiac Enzymes: No results for input(s): CKTOTAL, CKMB, CKMBINDEX, TROPONINI in the last 168 hours. BNP (last 3 results) No results for input(s): PROBNP in the last 8760 hours. HbA1C:  Recent Labs  05/26/16 0529  HGBA1C 5.2   CBG: No results for input(s): GLUCAP in the last 168 hours. Lipid Profile: No results for input(s): CHOL, HDL, LDLCALC, TRIG, CHOLHDL, LDLDIRECT in the last 72 hours. Thyroid Function Tests: No results for input(s): TSH, T4TOTAL, FREET4, T3FREE, THYROIDAB in the last 72 hours. Anemia Panel: No results for  input(s): VITAMINB12, FOLATE, FERRITIN, TIBC, IRON, RETICCTPCT in the last 72 hours. Urine analysis:    Component Value Date/Time   COLORURINE STRAW (A) 02/27/2016 1339   APPEARANCEUR CLEAR 02/27/2016 1339   LABSPEC 1.006 02/27/2016 1339   PHURINE 9.0 (H) 02/27/2016 1339   GLUCOSEU NEGATIVE 02/27/2016 1339   HGBUR NEGATIVE 02/27/2016 1339   BILIRUBINUR NEGATIVE 02/27/2016 1339   KETONESUR NEGATIVE 02/27/2016 1339   PROTEINUR NEGATIVE 02/27/2016 1339   UROBILINOGEN 1.0 11/30/2014 2105   NITRITE NEGATIVE 02/27/2016 1339   LEUKOCYTESUR NEGATIVE 02/27/2016 1339   Sepsis Labs: Invalid input(s): PROCALCITONIN, LACTICIDVEN  Recent Results (from the past 240 hour(s))  Culture, blood (routine x 2)     Status: None (Preliminary result)   Collection Time: 05/23/16  8:30 PM  Result Value Ref Range Status  Specimen Description BLOOD RIGHT ANTECUBITAL  Final   Special Requests BOTTLES DRAWN AEROBIC AND ANAEROBIC 5 CC EA  Final   Culture   Final    NO GROWTH 2 DAYS Performed at [facility], [address], [place], [place] [address]    Report Status PENDING  Incomplete  Culture, blood (routine x 2)     Status: None (Preliminary result)   Collection Time: 05/23/16  8:46 PM  Result Value Ref Range Status   Specimen Description BLOOD RIGHT ANTECUBITAL  Final   Special Requests BOTTLES DRAWN AEROBIC AND ANAEROBIC 7 CC EA  Final   Culture   Final    NO GROWTH 2 DAYS Performed at [facility], [place] [address] [place], [place] [address]    Report Status PENDING  Incomplete      Radiology Studies: No results found.   Scheduled Meds: . acidophilus  1 capsule Oral Daily  . aspirin EC  81 mg Oral QHS  .  ceFAZolin (ANCEF) IV  1 g Intravenous Q8H  . citalopram  20 mg Oral QHS  . enoxaparin (LOVENOX) injection  40 mg Subcutaneous Q24H  . furosemide  20 mg Oral Daily  . gabapentin  200 mg Oral q morning - 10a  . gabapentin  300 mg Oral QHS  . loratadine  10 mg Oral Daily    Continuous Infusions:   Marzetta Board, MD, PhD [facility] Pager [phone]  If 7PM-7AM, please contact night-coverage [email or web address] Password [facility] 05/27/2016, 10:48 AM

## 2016-05-27 NOTE — Progress Notes (Signed)
INFECTIOUS DISEASE PROGRESS NOTE  ID: Denise Kelly is a 76 y.o. female with  Principal Problem:   Cellulitis of right lower leg Active Problems:   Essential hypertension, benign   Alcohol dependence ([place])   Hypokalemia   Depression  Subjective: Feels better  Abtx:  Anti-infectives    Start     Dose/Rate Route Frequency Ordered Stop   05/26/16 2200  ceFAZolin (ANCEF) IVPB 1 g/50 mL premix     1 g 100 mL/hr over 30 Minutes Intravenous Every 8 hours 05/26/16 1941     05/26/16 1200  vancomycin (VANCOCIN) IVPB 750 mg/150 ml premix  Status:  Discontinued     750 mg 150 mL/hr over 60 Minutes Intravenous Every 12 hours 05/26/16 1110 05/26/16 1840   05/26/16 0800  piperacillin-tazobactam (ZOSYN) IVPB 3.375 g  Status:  Discontinued     3.375 g 12.5 mL/hr over 240 Minutes Intravenous Every 8 hours 05/26/16 0731 05/26/16 1840   05/24/16 1600  cefTRIAXone (ROCEPHIN) 1 g in dextrose 5 % 50 mL IVPB  Status:  Discontinued     1 g 100 mL/hr over 30 Minutes Intravenous Every 24 hours 05/24/16 1309 05/26/16 0721   05/23/16 2200  vancomycin (VANCOCIN) IVPB 1000 mg/200 mL premix  Status:  Discontinued     1,000 mg 200 mL/hr over 60 Minutes Intravenous Every 12 hours 05/23/16 1954 05/26/16 1110   05/23/16 1800  vancomycin (VANCOCIN) IVPB 1000 mg/200 mL premix     1,000 mg 200 mL/hr over 60 Minutes Intravenous  Once 05/23/16 1747 05/23/16 1908      Medications:  Scheduled: . acidophilus  1 capsule Oral Daily  . aspirin EC  81 mg Oral QHS  .  ceFAZolin (ANCEF) IV  1 g Intravenous Q8H  . citalopram  20 mg Oral QHS  . enoxaparin (LOVENOX) injection  40 mg Subcutaneous Q24H  . furosemide  20 mg Oral Daily  . gabapentin  200 mg Oral q morning - 10a  . gabapentin  300 mg Oral QHS  . loratadine  10 mg Oral Daily    Objective: Vital signs in last 24 hours: Temp:  [97.4 F (36.3 C)-97.9 F (36.6 C)] 97.9 F (36.6 C) (03/13 1416) Pulse Rate:  [74-79] 79 (03/13 1416) Resp:  [18] 18  (03/13 1416) BP: (116-142)/(65-73) 122/72 (03/13 1416) SpO2:  [92 %-100 %] 100 % (03/13 1416)   General appearance: alert, cooperative and no distress Resp: clear to auscultation bilaterally Cardio: regular rate and rhythm GI: normal findings: bowel sounds normal and soft, non-tender Extremities: RLE with hyperemia. non-tender. mild-mod heat. no skin breakdown.   Lab Results  Recent Labs  05/26/16 0529 05/27/16 0631  NA 144 142  K 3.6 3.8  CL 106 103  CO2 32 34*  BUN 9 11  CREATININE 0.84 0.91   Liver Panel No results for input(s): PROT, ALBUMIN, AST, ALT, ALKPHOS, BILITOT, BILIDIR, IBILI in the last 72 hours. Sedimentation Rate No results for input(s): ESRSEDRATE in the last 72 hours. C-Reactive Protein No results for input(s): CRP in the last 72 hours.  Microbiology: Recent Results (from the past 240 hour(s))  Culture, blood (routine x 2)     Status: None (Preliminary result)   Collection Time: 05/23/16  8:30 PM  Result Value Ref Range Status   Specimen Description BLOOD RIGHT ANTECUBITAL  Final   Special Requests BOTTLES DRAWN AEROBIC AND ANAEROBIC 5 CC EA  Final   Culture   Final    NO GROWTH 3  DAYS Performed at [facility], [place] [address] [place], [place] [address]    Report Status PENDING  Incomplete  Culture, blood (routine x 2)     Status: None (Preliminary result)   Collection Time: 05/23/16  8:46 PM  Result Value Ref Range Status   Specimen Description BLOOD RIGHT ANTECUBITAL  Final   Special Requests BOTTLES DRAWN AEROBIC AND ANAEROBIC 7 CC EA  Final   Culture   Final    NO GROWTH 3 DAYS Performed at [facility], [place] [address] [place], [place] [address]    Report Status PENDING  Incomplete    Studies/Results: No results found.   Assessment/Plan: Recurrent celullitis Prev TKR Suspected venous insufficiency from prev TKR  BCx are ngtd She feels better though still has erythema. Continue ancef When improving could go home on  keflex Would be glad to see in f/u to help get her onto PEN or clinda as outpt.  She needs compression stockings when acute inflammation better.   Back pain, radiating into hips She asks about pain mgmt I let her know we do not manage pain in ID clinic.  I asked her to f/u with her PCP (who also does not manage pain) or with pain clinic.   Total days of antibiotics: 3 vanco--> vanco/ceftriaxone--> vanco/zosyn --> ancef         [name] Infectious Diseases (pager) [phone] [email or web address] 05/27/2016, 5:45 PM  LOS: 3 days

## 2016-05-27 NOTE — Evaluation (Signed)
Physical Therapy Evaluation Patient Details Name: Denise Kelly MRN: [id number] DOB: 1941/03/04 Today's Date: 05/27/2016   History of Present Illness  76 yo female admitted with esential hypertension, R LE cellulitis. Hx of COPD, ETOH abuse, chronic pain, R TKA 2015, L hip IM nail 11/2015  Clinical Impression  Then patient is ambulating well, some assist to get legs back onto bed. The patient prefers to DC home but does not want to be a burden on her daughter. Recommended to patient to get a foam wedge for bed to elevate legs and find a recliner that will elevate legs as she was going to  [employer]  A chair. Pt admitted with above diagnosis. Pt currently with functional limitations due to the deficits listed below (see PT Problem List).  Pt will benefit from skilled PT to increase their independence and safety with mobility to allow discharge to the venue listed below.       Follow Up Recommendations Home health PT;Supervision - Intermittent    Equipment Recommendations   (foam wedge/)    Recommendations for Other Services       Precautions / Restrictions Precautions Precautions: Fall Restrictions Weight Bearing Restrictions: No      Mobility  Bed Mobility Overal bed mobility: Needs Assistance Bed Mobility: Supine to Sit;Sit to Supine     Supine to sit: Supervision Sit to supine: Min assist   General bed mobility comments: assist with the legs onto bed.  Transfers Overall transfer level: Needs assistance Equipment used: Rolling walker (2 wheeled) Transfers: Sit to/from Stand Sit to Stand: Min guard         General transfer comment: cues for safety  Ambulation/Gait Ambulation/Gait assistance: Min guard Ambulation Distance (Feet): 120 Feet Assistive device: Rolling walker (2 wheeled) Gait Pattern/deviations: Step-to pattern;Step-through pattern;Antalgic        Stairs            Wheelchair Mobility    Modified Rankin (Stroke Patients Only)        Balance                                             Pertinent Vitals/Pain Pain Assessment: 0-10 Pain Score: 4  Pain Location: lt hip and rt leg Pain Descriptors / Indicators: Discomfort;Grimacing;Sharp;Shooting Pain Intervention(s): Monitored during session;Repositioned    Home Living Family/patient expects to be discharged to:: Private residence Living Arrangements: Alone Available Help at Discharge: Family;Available PRN/intermittently Type of Home: Apartment Home Access: Stairs to enter     Home Layout: One level Home Equipment: Environmental consultant - 2 wheels;Cane - single point      Prior Function Level of Independence: Needs assistance   Gait / Transfers Assistance Needed: using walker for ambulation. Pt states she could make it to her bathroom unassisted.      Comments: using walker     Hand Dominance        Extremity/Trunk Assessment   Upper Extremity Assessment Upper Extremity Assessment: Overall WFL for tasks assessed    Lower Extremity Assessment Lower Extremity Assessment: RLE deficits/detail;LLE deficits/detail RLE Deficits / Details: noted cellulitis lower leg    Cervical / Trunk Assessment Cervical / Trunk Assessment: Kyphotic  Communication   Communication: HOH  Cognition Arousal/Alertness: Awake/alert Behavior During Therapy: WFL for tasks assessed/performed Overall Cognitive Status: Within Functional Limits for tasks assessed  General Comments      Exercises     Assessment/Plan    PT Assessment Patient needs continued PT services  PT Problem List Decreased strength;Decreased range of motion;Decreased activity tolerance;Decreased mobility;Decreased knowledge of precautions;Decreased knowledge of use of DME       PT Treatment Interventions DME instruction;Gait training;Functional mobility training;Therapeutic activities;Patient/family education    PT Goals (Current goals can be found in the Care  Plan section)  Acute Rehab PT Goals Patient Stated Goal: to go home PT Goal Formulation: With patient Time For Goal Achievement: 06/03/16 Potential to Achieve Goals: Good    Frequency Min 3X/week   Barriers to discharge Decreased caregiver support      Co-evaluation               End of Session   Activity Tolerance: Patient tolerated treatment well Patient left: in bed;with call bell/phone within reach Nurse Communication: Mobility status PT Visit Diagnosis: Unsteadiness on feet (R26.81);Pain Pain - Right/Left: Right Pain - part of body: Leg         Time: 1610-9604 PT Time Calculation (min) (ACUTE ONLY): 33 min   Charges:   PT Evaluation $PT Eval Low Complexity: 1 Procedure PT Treatments $Gait Training: 8-22 mins   PT G Codes:         Claretha Cooper 05/27/2016, 1:47 PM  Tresa Endo PT [phone]

## 2016-05-27 NOTE — Consult Note (Signed)
   [facility] Inpatient Consult   05/27/2016  Denise Kelly 09/13/1940 [id number]    Denise Kelly screened for [facility] program. Explained [facility] Care Management services. She states she would rather look it up on line before deciding. Accepted [facility] Care Management brochure and contact information to call should she decide to engage with [facility] services. Appreciative of visit. Will make inpatient RNCM aware that [facility] services were declined at this time.    Marthenia Rolling, MSN-Ed, RN,BSN [facility] Liaison [phone]

## 2016-05-28 DIAGNOSIS — G894 Chronic pain syndrome: Secondary | ICD-10-CM

## 2016-05-28 LAB — BASIC METABOLIC PANEL
Anion gap: 5 (ref 5–15)
BUN: 10 mg/dL (ref 6–20)
CALCIUM: 8.7 mg/dL — AB (ref 8.9–10.3)
CHLORIDE: 104 mmol/L (ref 101–111)
CO2: 32 mmol/L (ref 22–32)
CREATININE: 0.86 mg/dL (ref 0.44–1.00)
GFR calc non Af Amer: >60 mL/min (ref >60)
GLUCOSE: 102 mg/dL — AB (ref 65–99)
Potassium: 3.2 mmol/L — ABNORMAL LOW (ref 3.5–5.1)
Sodium: 141 mmol/L (ref 135–145)

## 2016-05-28 LAB — CBC
HEMATOCRIT: 42.2 % (ref 36.0–46.0)
Hemoglobin: 14.1 g/dL (ref 12.0–15.0)
MCH: 29.9 pg (ref 26.0–34.0)
MCHC: 33.4 g/dL (ref 30.0–36.0)
MCV: 89.6 fL (ref 78.0–100.0)
Platelets: 173 10*3/uL (ref 150–400)
RBC: 4.71 MIL/uL (ref 3.87–5.11)
RDW: 13.8 % (ref 11.5–15.5)
WBC: 7.7 10*3/uL (ref 4.0–10.5)

## 2016-05-28 MED ORDER — POTASSIUM CHLORIDE CRYS ER 10 MEQ PO TBCR
30.0000 meq | EXTENDED_RELEASE_TABLET | Freq: Four times a day (QID) | ORAL | Status: AC
  Administered 2016-05-28 (×2): 30 meq via ORAL
  Filled 2016-05-28 (×2): qty 1

## 2016-05-28 NOTE — Progress Notes (Signed)
PROGRESS NOTE  Denise Kelly IZT:[id number] DOB: 04/12/1940 DOA: 05/23/2016 PCP: Jonathon Bellows, MD   LOS: 4 days   Brief Narrative: Denise Kelly is a 76 y.o. female with medical history significant of hypertension, GERD, depression, hearing loss, skin cancer, former alcohol abuser, chronic back pain, who presents with right lower leg pain, found to have cellulitis and admitted for IV antibiotics.  As her cellulitis was not improving as expected, and she has had on and off cellulitis for the past 3 months, infectious disease was consulted on 3/12  Assessment & Plan: Principal Problem:   Cellulitis of right lower leg Active Problems:   Essential hypertension, benign   Alcohol dependence (HCC)   Hypokalemia   Depression   Chronic pain syndrome   RLE cellulitis -Patient with intermittent cellulitis over the last 3-4 months, on multiple course of oral antibiotics, which improved the cellulitis temporarily but once she stops antibiotics it comes back.  -Obtained an MRI to rule out deeper infection on 3/10, and this was negative -She has chronic lower extremity swelling, she has no evidence of heart failure most recent echo showed normal EF, probably has a degree of venous insufficiency with known history of TKR, recommended long-term compression stockings and keep her feet elevated every time she sits down -She failed Keflex as she completed a week's course as an outpatient prior to hospitalization -She was initially placed on vancomycin and ceftriaxone, not improving, was later placed on Zosyn and eventually infectious disease was consulted on 3/12.  She is currently on Ancef, continue.   HTN - Acceptable, on IV hydralazine when necessary  Hypokalemia -Repleted  Depression -Continue home meds  hx of Alcohol dependence ([place]), in remission  -pt states that she did not drink alcohol for 2 years.  Chronic back pain -prn percocet -continue neurontin  Bilateral leg edema -  BNP normal, prior echo in December 2017 with normal EF, probably has a degree of venous insufficiency, recommen compression stockings once cellulitis is improvedd    DVT prophylaxis: Lovenox Code Status: Full code Family Communication: None at bedside Disposition Plan: home when ready   Consultants:   Infectious disease  Procedures:   None   Antimicrobials:  Vancomycin 3/9 >>  Ceftriaxone 3/10 >> 3/12  Zosyn 3/12 >> 3/13  Ancef 3/13 >>  Subjective: -No complaints this morning, appreciate cellulitic area looking slightly improved  Objective: Vitals:   05/27/16 1416 05/27/16 2040 05/28/16 0543 05/28/16 1400  BP: 122/72 121/62 132/79 111/65  Pulse: 79 75 80 80  Resp: 18 20 20 18   Temp: 97.9 F (36.6 C) 98.7 F (37.1 C) 97.9 F (36.6 C) 97.8 F (36.6 C)  TempSrc: Oral Oral Oral Oral  SpO2: 100% 94% 92% 94%  Weight:      Height:        Intake/Output Summary (Last 24 hours) at 05/28/16 1434 Last data filed at 05/27/16 1853  Gross per 24 hour  Intake              240 ml  Output                0 ml  Net              240 ml   Filed Weights   05/23/16 1716  Weight: 98.4 kg (217 lb)    Examination: Constitutional: NAD Vitals:   05/27/16 1416 05/27/16 2040 05/28/16 0543 05/28/16 1400  BP: 122/72 121/62 132/79 111/65  Pulse: 79 75 80 80  Resp: 18 20 20 18   Temp: 97.9 F (36.6 C) 98.7 F (37.1 C) 97.9 F (36.6 C) 97.8 F (36.6 C)  TempSrc: Oral Oral Oral Oral  SpO2: 100% 94% 92% 94%  Weight:      Height:       Eyes: PERRL, lids and conjunctivae normal Respiratory: clear to auscultation bilaterally, no wheezing, no crackles.  Cardiovascular: Regular rate and rhythm, no murmurs / rubs / gallops. 1+ LE edema Abdomen: no tenderness. Bowel sounds positive.  Musculoskeletal: no clubbing / cyanosis Skin: cellulitic rash rle anterior shin, foot Neurologic: CN 2-12 grossly intact. Strength 5/5 in all 4.   Data Reviewed: I have personally reviewed following  labs and imaging studies  CBC:  Recent Labs Lab 05/23/16 1755 05/24/16 0645 05/28/16 0556  WBC 9.6 7.4 7.7  NEUTROABS 5.7  --   --   HGB 13.8 12.8 14.1  HCT 40.6 38.1 42.2  MCV 90.0 88.4 89.6  PLT 184 193 546   Basic Metabolic Panel:  Recent Labs Lab 05/23/16 1755 05/24/16 0645 05/26/16 0529 05/27/16 0631 05/28/16 0556  NA 141 140 144 142 141  K 3.1* 3.3* 3.6 3.8 3.2*  CL 103 103 106 103 104  CO2 33* 32 32 34* 32  GLUCOSE 109* 104* 117* 104* 102*  BUN 10 7 9 11 10   CREATININE 0.83 0.71 0.84 0.91 0.86  CALCIUM 8.6* 8.4* 8.8* 8.6* 8.7*  MG  --  1.8  --   --   --    GFR: Estimated Creatinine Clearance: 67.9 mL/min (by C-G formula based on SCr of 0.86 mg/dL). Liver Function Tests: No results for input(s): AST, ALT, ALKPHOS, BILITOT, PROT, ALBUMIN in the last 168 hours. No results for input(s): LIPASE, AMYLASE in the last 168 hours. No results for input(s): AMMONIA in the last 168 hours. Coagulation Profile: No results for input(s): INR, PROTIME in the last 168 hours. Cardiac Enzymes: No results for input(s): CKTOTAL, CKMB, CKMBINDEX, TROPONINI in the last 168 hours. BNP (last 3 results) No results for input(s): PROBNP in the last 8760 hours. HbA1C:  Recent Labs  05/26/16 0529  HGBA1C 5.2   CBG: No results for input(s): GLUCAP in the last 168 hours. Lipid Profile: No results for input(s): CHOL, HDL, LDLCALC, TRIG, CHOLHDL, LDLDIRECT in the last 72 hours. Thyroid Function Tests: No results for input(s): TSH, T4TOTAL, FREET4, T3FREE, THYROIDAB in the last 72 hours. Anemia Panel: No results for input(s): VITAMINB12, FOLATE, FERRITIN, TIBC, IRON, RETICCTPCT in the last 72 hours. Urine analysis:    Component Value Date/Time   COLORURINE STRAW (A) 02/27/2016 1339   APPEARANCEUR CLEAR 02/27/2016 1339   LABSPEC 1.006 02/27/2016 1339   PHURINE 9.0 (H) 02/27/2016 1339   GLUCOSEU NEGATIVE 02/27/2016 1339   HGBUR NEGATIVE 02/27/2016 1339   BILIRUBINUR NEGATIVE  02/27/2016 1339   KETONESUR NEGATIVE 02/27/2016 1339   PROTEINUR NEGATIVE 02/27/2016 1339   UROBILINOGEN 1.0 11/30/2014 2105   NITRITE NEGATIVE 02/27/2016 1339   LEUKOCYTESUR NEGATIVE 02/27/2016 1339   Sepsis Labs: Invalid input(s): PROCALCITONIN, LACTICIDVEN  Recent Results (from the past 240 hour(s))  Culture, blood (routine x 2)     Status: None (Preliminary result)   Collection Time: 05/23/16  8:30 PM  Result Value Ref Range Status   Specimen Description BLOOD RIGHT ANTECUBITAL  Final   Special Requests BOTTLES DRAWN AEROBIC AND ANAEROBIC 5 CC EA  Final   Culture   Final    NO GROWTH 4 DAYS Performed at [facility], [address], [place], [place] [address]    Report Status PENDING  Incomplete  Culture, blood (routine x 2)     Status: None (Preliminary result)   Collection Time: 05/23/16  8:46 PM  Result Value Ref Range Status   Specimen Description BLOOD RIGHT ANTECUBITAL  Final   Special Requests BOTTLES DRAWN AEROBIC AND ANAEROBIC 7 CC EA  Final   Culture   Final    NO GROWTH 4 DAYS Performed at [facility], [place] [address] [place], [place] [address]    Report Status PENDING  Incomplete      Radiology Studies: No results found.   Scheduled Meds: . acidophilus  1 capsule Oral Daily  . aspirin EC  81 mg Oral QHS  .  ceFAZolin (ANCEF) IV  1 g Intravenous Q8H  . citalopram  20 mg Oral QHS  . enoxaparin (LOVENOX) injection  40 mg Subcutaneous Q24H  . furosemide  20 mg Oral Daily  . gabapentin  200 mg Oral q morning - 10a  . gabapentin  300 mg Oral QHS  . loratadine  10 mg Oral Daily  . potassium chloride  30 mEq Oral Q6H   Continuous Infusions:   Phillips Climes, MD, [facility] Pager [phone]  If 7PM-7AM, please contact night-coverage [email or web address] Password [facility] 05/28/2016, 2:34 PM

## 2016-05-28 NOTE — Progress Notes (Signed)
INFECTIOUS DISEASE PROGRESS NOTE  ID: Denise Kelly is a 76 y.o. female with  Principal Problem:   Cellulitis of right lower leg Active Problems:   Essential hypertension, benign   Alcohol dependence ([place])   Hypokalemia   Depression   Chronic pain syndrome  Subjective: Pain more proximally in R leg.   Abtx:  Anti-infectives    Start     Dose/Rate Route Frequency Ordered Stop   05/26/16 2200  ceFAZolin (ANCEF) IVPB 1 g/50 mL premix     1 g 100 mL/hr over 30 Minutes Intravenous Every 8 hours 05/26/16 1941     05/26/16 1200  vancomycin (VANCOCIN) IVPB 750 mg/150 ml premix  Status:  Discontinued     750 mg 150 mL/hr over 60 Minutes Intravenous Every 12 hours 05/26/16 1110 05/26/16 1840   05/26/16 0800  piperacillin-tazobactam (ZOSYN) IVPB 3.375 g  Status:  Discontinued     3.375 g 12.5 mL/hr over 240 Minutes Intravenous Every 8 hours 05/26/16 0731 05/26/16 1840   05/24/16 1600  cefTRIAXone (ROCEPHIN) 1 g in dextrose 5 % 50 mL IVPB  Status:  Discontinued     1 g 100 mL/hr over 30 Minutes Intravenous Every 24 hours 05/24/16 1309 05/26/16 0721   05/23/16 2200  vancomycin (VANCOCIN) IVPB 1000 mg/200 mL premix  Status:  Discontinued     1,000 mg 200 mL/hr over 60 Minutes Intravenous Every 12 hours 05/23/16 1954 05/26/16 1110   05/23/16 1800  vancomycin (VANCOCIN) IVPB 1000 mg/200 mL premix     1,000 mg 200 mL/hr over 60 Minutes Intravenous  Once 05/23/16 1747 05/23/16 1908      Medications:  Scheduled: . acidophilus  1 capsule Oral Daily  . aspirin EC  81 mg Oral QHS  .  ceFAZolin (ANCEF) IV  1 g Intravenous Q8H  . citalopram  20 mg Oral QHS  . enoxaparin (LOVENOX) injection  40 mg Subcutaneous Q24H  . furosemide  20 mg Oral Daily  . gabapentin  200 mg Oral q morning - 10a  . gabapentin  300 mg Oral QHS  . loratadine  10 mg Oral Daily    Objective: Vital signs in last 24 hours: Temp:  [97.8 F (36.6 C)-98.7 F (37.1 C)] 97.8 F (36.6 C) (03/14 1400) Pulse Rate:   [75-80] 80 (03/14 1400) Resp:  [18-20] 18 (03/14 1400) BP: (111-132)/(62-79) 111/65 (03/14 1400) SpO2:  [92 %-94 %] 94 % (03/14 1400)   General appearance: alert, cooperative and no distress Extremities: RLE- mild decreased in erythema. no change in area. no skin breakdown. +heat.   Lab Results  Recent Labs  05/27/16 0631 05/28/16 0556  WBC  --  7.7  HGB  --  14.1  HCT  --  42.2  NA 142 141  K 3.8 3.2*  CL 103 104  CO2 34* 32  BUN 11 10  CREATININE 0.91 0.86   Liver Panel No results for input(s): PROT, ALBUMIN, AST, ALT, ALKPHOS, BILITOT, BILIDIR, IBILI in the last 72 hours. Sedimentation Rate No results for input(s): ESRSEDRATE in the last 72 hours. C-Reactive Protein No results for input(s): CRP in the last 72 hours.  Microbiology: Recent Results (from the past 240 hour(s))  Culture, blood (routine x 2)     Status: None (Preliminary result)   Collection Time: 05/23/16  8:30 PM  Result Value Ref Range Status   Specimen Description BLOOD RIGHT ANTECUBITAL  Final   Special Requests BOTTLES DRAWN AEROBIC AND ANAEROBIC 5 CC EA  Final   Culture   Final    NO GROWTH 4 DAYS Performed at [facility], [place] [address], [place], [place] [address]    Report Status PENDING  Incomplete  Culture, blood (routine x 2)     Status: None (Preliminary result)   Collection Time: 05/23/16  8:46 PM  Result Value Ref Range Status   Specimen Description BLOOD RIGHT ANTECUBITAL  Final   Special Requests BOTTLES DRAWN AEROBIC AND ANAEROBIC 7 CC EA  Final   Culture   Final    NO GROWTH 4 DAYS Performed at [facility], [place] [address] [place], [place] [address]    Report Status PENDING  Incomplete    Studies/Results: No results found.   Assessment/Plan: Recurrent celullitis Prev TKR Suspected venous insufficiency from prev TKR  BCx are ngtd She feels better though still has erythema. Continue ancef When improving could go home on keflex for total of 2 weeks Would  be glad to see in f/u to help get her onto PEN or clinda as outpt.  She needs compression stockings when acute inflammation better.  I asked RN to obtain for her.   Total days of antibiotics: 4 vanco--> vanco/ceftriaxone--> vanco/zosyn --> ancef         [name] Infectious Diseases (pager) [phone] [email or web address] 05/28/2016, 5:23 PM  LOS: 4 days

## 2016-05-28 NOTE — Care Management Important Message (Signed)
Important Message  Patient Details  Name: Denise Kelly MRN: [id number] Date of Birth: August 27, 1940   Medicare Important Message Given:  Yes    Kerin Salen 05/28/2016, 10:15 AMImportant Message  Patient Details  Name: Denise Kelly MRN: [id number] Date of Birth: 11/18/1940   Medicare Important Message Given:  Yes    Kerin Salen 05/28/2016, 10:15 AM

## 2016-05-29 DIAGNOSIS — F329 Major depressive disorder, single episode, unspecified: Secondary | ICD-10-CM

## 2016-05-29 LAB — CULTURE, BLOOD (ROUTINE X 2)
CULTURE: NO GROWTH
Culture: NO GROWTH

## 2016-05-29 MED ORDER — POTASSIUM CHLORIDE ER 10 MEQ PO TBCR: 10.0000 meq | EXTENDED_RELEASE_TABLET | Freq: Every day | ORAL | 0 refills | Status: DC

## 2016-05-29 MED ORDER — FUROSEMIDE 20 MG PO TABS: 20.0000 mg | ORAL_TABLET | Freq: Every day | ORAL | 0 refills | Status: DC

## 2016-05-29 MED ORDER — RISAQUAD PO CAPS: 2.0000 | ORAL_CAPSULE | Freq: Every day | ORAL | 0 refills | Status: DC

## 2016-05-29 MED ORDER — OXYCODONE HCL 5 MG PO TABS: 5.0000 mg | ORAL_TABLET | Freq: Four times a day (QID) | ORAL | 0 refills | Status: DC | PRN

## 2016-05-29 MED ORDER — CEPHALEXIN 500 MG PO CAPS: 500.0000 mg | ORAL_CAPSULE | Freq: Two times a day (BID) | ORAL | 0 refills | Status: DC

## 2016-05-29 NOTE — Discharge Instructions (Signed)
Follow with Primary MD WEBB, CAROL D, MD in 7 days   Get CBC, CMP, 2 view Chest X ray checked  by Primary MD next visit.    Activity: As tolerated with Full fall precautions use walker/cane & assistance as needed   Disposition Home    Diet: Heart Healthy diet , with feeding assistance and aspiration precautions.  For Heart failure patients - Check your Weight same time everyday, if you gain over 2 pounds, or you develop in leg swelling, experience more shortness of breath or chest pain, call your Primary MD immediately. Follow Cardiac Low Salt Diet and 1.5 lit/day fluid restriction.   On your next visit with your primary care physician please Get Medicines reviewed and adjusted.   Please request your Prim.MD to go over all Hospital Tests and Procedure/Radiological results at the follow up, please get all Hospital records sent to your Prim MD by signing hospital release before you go home.   If you experience worsening of your admission symptoms, develop shortness of breath, life threatening emergency, suicidal or homicidal thoughts you must seek medical attention immediately by calling 911 or calling your MD immediately  if symptoms less severe.  You Must read complete instructions/literature along with all the possible adverse reactions/side effects for all the Medicines you take and that have been prescribed to you. Take any new Medicines after you have completely understood and accpet all the possible adverse reactions/side effects.   Do not drive, operating heavy machinery, perform activities at heights, swimming or participation in water activities or provide baby sitting services if your were admitted for syncope or siezures until you have seen by Primary MD or a Neurologist and advised to do so again.  Do not drive when taking Pain medications.    Do not take more than prescribed Pain, Sleep and Anxiety Medications  Special Instructions: If you have smoked or chewed Tobacco   in the last 2 yrs please stop smoking, stop any regular Alcohol  and or any Recreational drug use.  Wear Seat belts while driving.   Please note  You were cared for by a hospitalist during your hospital stay. If you have any questions about your discharge medications or the care you received while you were in the hospital after you are discharged, you can call the unit and asked to speak with the hospitalist on call if the hospitalist that took care of you is not available. Once you are discharged, your primary care physician will handle any further medical issues. Please note that NO REFILLS for any discharge medications will be authorized once you are discharged, as it is imperative that you return to your primary care physician (or establish a relationship with a primary care physician if you do not have one) for your aftercare needs so that they can reassess your need for medications and monitor your lab values.

## 2016-05-29 NOTE — Progress Notes (Signed)
Pt discharged home.  Discharge instructions with prescriptions given.  Prescriptions also called to [facility].  IV d/c with cath intact. VSS.  Pt escorted to main entrance via wheelchair, accompanied by [place] NT and pt daughter.

## 2016-05-29 NOTE — Progress Notes (Addendum)
Date: May 29, 2016 Spoke with patient uses [facility] for her home care, will contact once hhc orders are in the system. 1153/[facility]ct[facility] intake/hhc needs reviewed over phone and orders with demographics faxed to [phone]. Velva Harman, RN, BSN, [place]   [phone]

## 2016-05-29 NOTE — Progress Notes (Signed)
INFECTIOUS DISEASE PROGRESS NOTE  ID: Denise Kelly is a 76 y.o. female with  Principal Problem:   Cellulitis of right lower leg Active Problems:   Essential hypertension, benign   Alcohol dependence ([place])   Hypokalemia   Depression   Chronic pain syndrome  Subjective: Concerns about transportation for d/c.   Abtx:  Anti-infectives    Start     Dose/Rate Route Frequency Ordered Stop   05/29/16 0000  cephALEXin (KEFLEX) 500 MG capsule     500 mg Oral 2 times daily 05/29/16 1125 06/12/16 2359   05/26/16 2200  ceFAZolin (ANCEF) IVPB 1 g/50 mL premix     1 g 100 mL/hr over 30 Minutes Intravenous Every 8 hours 05/26/16 1941     05/26/16 1200  vancomycin (VANCOCIN) IVPB 750 mg/150 ml premix  Status:  Discontinued     750 mg 150 mL/hr over 60 Minutes Intravenous Every 12 hours 05/26/16 1110 05/26/16 1840   05/26/16 0800  piperacillin-tazobactam (ZOSYN) IVPB 3.375 g  Status:  Discontinued     3.375 g 12.5 mL/hr over 240 Minutes Intravenous Every 8 hours 05/26/16 0731 05/26/16 1840   05/24/16 1600  cefTRIAXone (ROCEPHIN) 1 g in dextrose 5 % 50 mL IVPB  Status:  Discontinued     1 g 100 mL/hr over 30 Minutes Intravenous Every 24 hours 05/24/16 1309 05/26/16 0721   05/23/16 2200  vancomycin (VANCOCIN) IVPB 1000 mg/200 mL premix  Status:  Discontinued     1,000 mg 200 mL/hr over 60 Minutes Intravenous Every 12 hours 05/23/16 1954 05/26/16 1110   05/23/16 1800  vancomycin (VANCOCIN) IVPB 1000 mg/200 mL premix     1,000 mg 200 mL/hr over 60 Minutes Intravenous  Once 05/23/16 1747 05/23/16 1908      Medications:  Scheduled: . acidophilus  1 capsule Oral Daily  . aspirin EC  81 mg Oral QHS  .  ceFAZolin (ANCEF) IV  1 g Intravenous Q8H  . citalopram  20 mg Oral QHS  . enoxaparin (LOVENOX) injection  40 mg Subcutaneous Q24H  . furosemide  20 mg Oral Daily  . gabapentin  200 mg Oral q morning - 10a  . gabapentin  300 mg Oral QHS  . loratadine  10 mg Oral Daily     Objective: Vital signs in last 24 hours: Temp:  [97.8 F (36.6 C)-98.8 F (37.1 C)] 98 F (36.7 C) (03/15 0614) Pulse Rate:  [72-83] 83 (03/15 0614) Resp:  [17-18] 17 (03/15 0614) BP: (106-120)/(64-76) 120/64 (03/15 0614) SpO2:  [94 %-96 %] 96 % (03/15 0614)   General appearance: alert, cooperative and no distress Extremities: RLE with decreased erythema, no increase proximally,   Lab Results  Recent Labs  05/27/16 0631 05/28/16 0556  WBC  --  7.7  HGB  --  14.1  HCT  --  42.2  NA 142 141  K 3.8 3.2*  CL 103 104  CO2 34* 32  BUN 11 10  CREATININE 0.91 0.86   Liver Panel No results for input(s): PROT, ALBUMIN, AST, ALT, ALKPHOS, BILITOT, BILIDIR, IBILI in the last 72 hours. Sedimentation Rate No results for input(s): ESRSEDRATE in the last 72 hours. C-Reactive Protein No results for input(s): CRP in the last 72 hours.  Microbiology: Recent Results (from the past 240 hour(s))  Culture, blood (routine x 2)     Status: None (Preliminary result)   Collection Time: 05/23/16  8:30 PM  Result Value Ref Range Status   Specimen Description BLOOD  RIGHT ANTECUBITAL  Final   Special Requests BOTTLES DRAWN AEROBIC AND ANAEROBIC 5 CC EA  Final   Culture   Final    NO GROWTH 4 DAYS Performed at [facility], [address], [place], [place] [address]    Report Status PENDING  Incomplete  Culture, blood (routine x 2)     Status: None (Preliminary result)   Collection Time: 05/23/16  8:46 PM  Result Value Ref Range Status   Specimen Description BLOOD RIGHT ANTECUBITAL  Final   Special Requests BOTTLES DRAWN AEROBIC AND ANAEROBIC 7 CC EA  Final   Culture   Final    NO GROWTH 4 DAYS Performed at [facility], [place] [address], [place], [place] [address]    Report Status PENDING  Incomplete    Studies/Results: No results found.   Assessment/Plan: Recurrent celullitis Prev TKR Suspected venous insufficiency from prev TKR  BCx are ngtd Her erythema  has improved Home on keflex today for 2 weeks Would be glad to see in f/u to help get her onto PEN or clinda as outpt.  Has compression stockings now  Total days of antibiotics: 4 vanco--> vanco/ceftriaxone--> vanco/zosyn --> ancef         [name] Infectious Diseases (pager) [phone] [email or web address] 05/29/2016, 11:25 AM  LOS: 5 days

## 2016-05-29 NOTE — Discharge Summary (Signed)
Denise Kelly, is a 76 y.o. female  DOB 1940-06-27  MRN [id number].  Admission date:  05/23/2016  Admitting Physician  Ivor Costa, MD  Discharge Date:  05/29/2016   Primary MD  Jonathon Bellows, MD  Recommendations for primary care physician for things to follow:  - Please check CBC, BMP during next visit and adjust Lasix dose if needed - Patient to follow with infectious disease as an outpatient regarding recommendation for suppressive antibiotic treatment   Admission Diagnosis  Cellulitis of right lower extremity [L03.115] Cellulitis of right lower leg [L03.115]   Discharge Diagnosis  Cellulitis of right lower extremity [L03.115] Cellulitis of right lower leg [L03.115]    Principal Problem:   Cellulitis of right lower leg Active Problems:   Essential hypertension, benign   Alcohol dependence ([place])   Hypokalemia   Depression   Chronic pain syndrome      Past Medical History:  Diagnosis Date  . Alcohol abuse    ETOH and xanax  . Anxiety    Panic attacks   . Arthritis   . Cancer ([place])    skin- basal , squamous , melonoma- right hand  . Cellulitis    right leg  . Complication of anesthesia 2009   "felt drunk for a week after"- AVM- both times- felt drunk  . Depression   . Encephalopathy   . GERD (gastroesophageal reflux disease)   . HOH (hard of hearing)   . HOH (hard of hearing)   . Hypertension    not on mediacations  . Palpitations   . UTI (urinary tract infection)    frequently    Past Surgical History:  Procedure Laterality Date  . BREAST SURGERY Right    2 breast biopsies  . 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 COLONOSCOPY  2011   polyps  . EYE SURGERY Bilateral    cataracts  . INTRAMEDULLARY (IM) NAIL INTERTROCHANTERIC Left 11/29/2015   Procedure: LEFT HIP   NAIL;  Surgeon: Renette Butters, MD;  Location: [facility];  Service: Orthopedics;  Laterality: Left;   . JOINT REPLACEMENT Left 09/2009   knee  . TONSILLECTOMY    . TOTAL KNEE ARTHROPLASTY Right 01/02/2014   Procedure: TOTAL KNEE ARTHROPLASTY;  Surgeon: [name], MD;  Location: [facility];  Service: Orthopedics;  Laterality: Right;  . TUBAL LIGATION         History of present illness and  Hospital Course:     Kindly see H&P for history of present illness and admission details, please review complete Labs, Consult reports and Test reports for all details in brief  HPI  from the history and physical done on the day of admission 05/23/2016  HPI: Denise Kelly is a 76 y.o. female with medical history significant of hypertension, GERD, depression, hearing loss, skin cancer, former alcohol abuser, chronic back pain, who presents with right lower leg pain.  Pt states that she has had ongoing cellulitis in the right lower extremity for the  past several months since 02/2016. She has been taking intermittent antibiotics as prescribed by her primary care doctor. She has been hospitalized as well in the past for cellulitis. She had been taking Keflex over the past week or so, and was switched to doxycycline by her primary care provider on Wednesday, but no improvement. She states that her right lower leg pian and swelling are getting worse. It is constant, 6 out of 10 in severity, nonradiating. It is not aggravated or alleviated by any factors. It is also erythematous. Patient denies fever, chills. She has nausea, but no vomiting, abdominal pain or diarrhea. No chest pain, shortness of breath, coughing, symptoms of UTI or unilateral weakness. She states that her PCP sent her here to rule out DVT.    ED Course: pt was found to have WBC 9.6, potassium 3.1, creatinine normal, temperature normal, no tachycardia, oxygen saturation 94% on room air. Lower extremity Doppler for right leg is negative for DVT per preliminary report. Pt is placed on tele bed for obs.   Leggett a 76  y.o.femalewith medical history significant of hypertension, GERD, depression, hearing loss, skin cancer, former alcohol abuser, chronic back pain, who presents with right lower leg pain, found to have cellulitis and admitted for IV antibiotics.  As her cellulitis was not improving as expected, and she has had on and off cellulitis for the past 3 months, infectious disease was consulted on 3/12  RLE cellulitis -Patient with intermittent cellulitis over the last 3-4 months, on multiple course of oral antibiotics, which improved the cellulitis temporarily but once she stops antibiotics it comes back.  - MRI to rule out deeper infection on 3/10, and this was negative - She has chronic lower extremity swelling, she has no evidence of heart failure most recent echo showed normal EF, probably has a degree of venous insufficiency with known history of TKR, recommended long-term compression stockings and keep her feet elevated every time she sits down -She was initially placed on vancomycin and ceftriaxone, not improving, was later placed on Zosyn and eventually infectious disease was consulted on 3/12.   she was transitioned to Ancef, cellulitis continues to improve, to continue on Keflex for total of 2 weeks per ID recommendation, then to follow as an outpatient for possible suppressive antibiotic therapy.  HTN - Acceptable, not in the medication during hospital stay, Aldactone has been stopped as she was started on Lasix  Hypokalemia -Repleted, will discharge an oral supplement  Depression -Continue home meds  hx ofAlcohol dependence ([place]), in remission -pt states that she did not drink alcohol for 2 years.  Chronic back pain -prn percocet -continue neurontin  Bilateral leg edema - BNP normal, prior echo in December 2017 with normal EF, probably has a degree of venous insufficiency, recommen compression stockings once cellulitis is improvedd      Discharge Condition:   stable   Follow UP  Follow-up Information    WEBB, CAROL D, MD Follow up in 1 week(s).   Specialty:  Family Medicine Contact information: [place] [address] [phone]        [name], MD Follow up in 10 day(s).   Specialty:  Infectious Diseases Contact information: [place] [address] [phone]             Discharge Instructions  and  Discharge Medications    Discharge Instructions    Discharge instructions  Complete by:  As directed    Follow with Primary MD WEBB, CAROL D, MD in 7 days   Get CBC, CMP,  checked  by Primary MD next visit.    Activity: As tolerated with Full fall precautions use walker/cane & assistance as needed   Disposition Home    Diet: Heart Healthy diet , with feeding assistance and aspiration precautions.  For Heart failure patients - Check your Weight same time everyday, if you gain over 2 pounds, or you develop in leg swelling, experience more shortness of breath or chest pain, call your Primary MD immediately. Follow Cardiac Low Salt Diet and 1.5 lit/day fluid restriction.   On your next visit with your primary care physician please Get Medicines reviewed and adjusted.   Please request your Prim.MD to go over all Hospital Tests and Procedure/Radiological results at the follow up, please get all Hospital records sent to your Prim MD by signing hospital release before you go home.   If you experience worsening of your admission symptoms, develop shortness of breath, life threatening emergency, suicidal or homicidal thoughts you must seek medical attention immediately by calling 911 or calling your MD immediately  if symptoms less severe.  You Must read complete instructions/literature along with all the possible adverse reactions/side effects for all the Medicines you take and that have been prescribed to you. Take any new Medicines after you have  completely understood and accpet all the possible adverse reactions/side effects.   Do not drive, operating heavy machinery, perform activities at heights, swimming or participation in water activities or provide baby sitting services if your were admitted for syncope or siezures until you have seen by Primary MD or a Neurologist and advised to do so again.  Do not drive when taking Pain medications.    Do not take more than prescribed Pain, Sleep and Anxiety Medications  Special Instructions: If you have smoked or chewed Tobacco  in the last 2 yrs please stop smoking, stop any regular Alcohol  and or any Recreational drug use.  Wear Seat belts while driving.   Please note  You were cared for by a hospitalist during your hospital stay. If you have any questions about your discharge medications or the care you received while you were in the hospital after you are discharged, you can call the unit and asked to speak with the hospitalist on call if the hospitalist that took care of you is not available. Once you are discharged, your primary care physician will handle any further medical issues. Please note that NO REFILLS for any discharge medications will be authorized once you are discharged, as it is imperative that you return to your primary care physician (or establish a relationship with a primary care physician if you do not have one) for your aftercare needs so that they can reassess your need for medications and monitor your lab values.   Increase activity slowly    Complete by:  As directed      Allergies as of 05/29/2016      Reactions   Sulfa Antibiotics Hives, Swelling, Other (See Comments)   Reaction:  Facial/eye swelling    Ciprofloxacin Itching   Motrin [ibuprofen] Palpitations      Medication List    STOP taking these medications   doxycycline 100 MG capsule Commonly known as:  MONODOX   polyethylene glycol packet Commonly known as:  MIRALAX / GLYCOLAX    spironolactone 25 MG tablet Commonly known as:  ALDACTONE  TAKE these medications   acetaminophen 500 MG tablet Commonly known as:  TYLENOL Take 1,000 mg by mouth every 6 (six) hours as needed for mild pain, moderate pain or headache.   acidophilus Caps capsule Take 2 capsules by mouth daily. What changed:  how much to take   aspirin EC 81 MG tablet Take 81 mg by mouth at bedtime.   cephALEXin 500 MG capsule Commonly known as:  KEFLEX Take 1 capsule (500 mg total) by mouth 2 (two) times daily.   citalopram 20 MG tablet Commonly known as:  CELEXA Take 1 tablet (20 mg total) by mouth at bedtime.   CLARITIN 10 MG tablet Generic drug:  loratadine Take 10 mg by mouth daily.   estradiol 0.1 MG/GM vaginal cream Commonly known as:  ESTRACE Place 1 Applicatorful vaginally at bedtime as needed (for vaginal discomfort).   folic acid 1 MG tablet Commonly known as:  FOLVITE Take 1 tablet (1 mg total) by mouth daily.   furosemide 20 MG tablet Commonly known as:  LASIX Take 1 tablet (20 mg total) by mouth daily. Start taking on:  05/30/2016   gabapentin 100 MG capsule Commonly known as:  NEURONTIN Take 100-300 mg by mouth 2 (two) times daily. 200 mg in the morning & 300 mg at bedtime   hydrOXYzine 25 MG tablet Commonly known as:  ATARAX/VISTARIL Take 50 mg by mouth at bedtime as needed for anxiety (and/or sleep).   ipratropium-albuterol 0.5-2.5 (3) MG/3ML Soln Commonly known as:  DUONEB Take 3 mLs by nebulization every 4 (four) hours as needed.   methocarbamol 500 MG tablet Commonly known as:  ROBAXIN Take 250-500 mg by mouth 2 (two) times daily as needed (for leg cramps/spasms.).   ondansetron 4 MG disintegrating tablet Commonly known as:  ZOFRAN-ODT Take 4 mg by mouth every 6 (six) hours as needed for nausea or vomiting.   oxyCODONE 5 MG immediate release tablet Commonly known as:  Oxy IR/ROXICODONE Take 1 tablet (5 mg total) by mouth every 6 (six) hours as  needed for severe pain. What changed:  when to take this   potassium chloride 10 MEQ tablet Commonly known as:  K-DUR Take 1 tablet (10 mEq total) by mouth daily.   pramipexole 0.125 MG tablet Commonly known as:  MIRAPEX Take 0.375 mg by mouth 2 (two) times daily as needed (for restless leg syndrome).   senna-docusate 8.6-50 MG tablet Commonly known as:  SENOKOT S Take 1 tablet by mouth 2 (two) times daily.   SYSTANE OP Apply 1-2 drops to eye 3 (three) times daily as needed (for dry eyes).   thiamine 100 MG tablet Take 1 tablet (100 mg total) by mouth daily.   traMADol 50 MG tablet Commonly known as:  ULTRAM Take 1 tablet (50 mg total) by mouth every 6 (six) hours as needed for moderate pain.   traZODone 100 MG tablet Commonly known as:  DESYREL Take 100 mg by mouth at bedtime as needed for sleep.   zolpidem 10 MG tablet Commonly known as:  AMBIEN Take 5 mg by mouth at bedtime as needed for sleep.         Diet and Activity recommendation: See Discharge Instructions above   Consults obtained - ID Dr. Johnnye Sima   Major procedures and Radiology Reports - PLEASE review detailed and final reports for all details, in brief -     Mr Tibia Fibula Right Wo Contrast  Result Date: 05/24/2016 CLINICAL DATA:  Bilateral leg pain.  Cellulitis.  EXAM: MRI OF LOWER RIGHT EXTREMITY WITHOUT CONTRAST TECHNIQUE: Multiplanar, multisequence MR imaging of the right lower extremity was performed. No intravenous contrast was administered. COMPARISON:  None. FINDINGS: Bones/Joint/Cartilage Bilateral total knee arthroplasties with susceptibility artifact partially obscuring the adjacent soft tissue and osseous structures. No periarticular fluid collection or osteolysis. No focal marrow signal abnormality. No fracture or dislocation. Normal alignment. No periosteal reaction or bone destruction. Ligaments, Muscles and Tendons Muscles are normal. No muscle edema. No intramuscular fluid collection.  Soft tissue No fluid collection or hematoma. No soft tissue mass. Asymmetric mild soft tissue edema and skin thickening of the right lower leg which may be secondary to mild cellulitis. IMPRESSION: 1. No osseous abnormality of the right tibia or fibula. 2. Asymmetric mild soft tissue edema and skin thickening of the right lower leg which may be secondary to mild cellulitis. No focal fluid collection to suggest an abscess. Electronically Signed   By: Kathreen Devoid   On: 05/24/2016 14:11    Micro Results    Recent Results (from the past 240 hour(s))  Culture, blood (routine x 2)     Status: None (Preliminary result)   Collection Time: 05/23/16  8:30 PM  Result Value Ref Range Status   Specimen Description BLOOD RIGHT ANTECUBITAL  Final   Special Requests BOTTLES DRAWN AEROBIC AND ANAEROBIC 5 CC EA  Final   Culture   Final    NO GROWTH 4 DAYS Performed at [facility], [address] [place], [place] [address]    Report Status PENDING  Incomplete  Culture, blood (routine x 2)     Status: None (Preliminary result)   Collection Time: 05/23/16  8:46 PM  Result Value Ref Range Status   Specimen Description BLOOD RIGHT ANTECUBITAL  Final   Special Requests BOTTLES DRAWN AEROBIC AND ANAEROBIC 7 CC EA  Final   Culture   Final    NO GROWTH 4 DAYS Performed at [facility], [place] [address] [place], [place] [address]    Report Status PENDING  Incomplete       Today   Subjective:   Andriana Casa today has no headache,no chest or abdominal pain,no new weakness tingling or numbness, feels much better  today.   Objective:   Blood pressure 120/64, pulse 83, temperature 98 F (36.7 C), temperature source Oral, resp. rate 17, height 5\' 6"  (1.676 m), weight 98.4 kg (217 lb), SpO2 96 %.   Intake/Output Summary (Last 24 hours) at 05/29/16 1132 Last data filed at 05/29/16 0830  Gross per 24 hour  Intake              240 ml  Output                0 ml  Net              240 ml     Exam Awake Alert, Oriented x 3, No new F.N deficits, Normal affect Supple Neck,No JVD,   Symmetrical Chest wall movement, Good air movement bilaterally, CTAB RRR,No Gallops,Rubs or new Murmurs, No Parasternal Heave +ve B.Sounds, Abd Soft, Non tender,  No rebound -guarding or rigidity. No Cyanosis, Clubbing or edema, Right lower extremity erythema, improved since yesterday, warmth has resolved  Data Review   CBC w Diff:  Lab Results  Component Value Date   WBC 7.7 05/28/2016   HGB 14.1 05/28/2016   HCT 42.2 05/28/2016   PLT 173 05/28/2016   LYMPHOPCT 30 05/23/2016  MONOPCT 9 05/23/2016   EOSPCT 2 05/23/2016   BASOPCT 0 05/23/2016    CMP:  Lab Results  Component Value Date   NA 141 05/28/2016   K 3.2 (L) 05/28/2016   CL 104 05/28/2016   CO2 32 05/28/2016   BUN 10 05/28/2016   CREATININE 0.86 05/28/2016   PROT 5.6 (L) 02/27/2016   ALBUMIN 2.8 (L) 02/27/2016   BILITOT 0.4 02/27/2016   ALKPHOS 91 02/27/2016   AST 18 02/27/2016   ALT 10 (L) 02/27/2016  .   Total Time in preparing paper work, data evaluation and todays exam - 35 minutes  Lonia Roane M.D on 05/29/2016 at 11:32 AM  [facility]   Office  [phone]

## 2016-06-03 ENCOUNTER — Telehealth: Payer: Self-pay | Admitting: *Deleted

## 2016-06-03 NOTE — Telephone Encounter (Signed)
Patient recently discharged from the hospital, on keflex twice daily for 2 weeks. Her follow up is 4/18 with Dr Megan Salon. She would like to know if she should continue antibiotics until she is seen, stop after 2 weeks, or change to another antibiotic. Please advise. Landis Gandy, RN

## 2016-06-04 ENCOUNTER — Other Ambulatory Visit: Payer: Self-pay

## 2016-06-04 ENCOUNTER — Other Ambulatory Visit: Payer: Self-pay | Admitting: *Deleted

## 2016-06-04 DIAGNOSIS — A4151 Sepsis due to Escherichia coli [E. coli]: Secondary | ICD-10-CM

## 2016-06-04 MED ORDER — CEPHALEXIN 500 MG PO CAPS: 500.0000 mg | ORAL_CAPSULE | Freq: Two times a day (BID) | ORAL | 0 refills | Status: DC

## 2016-06-04 NOTE — Telephone Encounter (Signed)
Please continue on keflex until she sees me in f/u Please have her see me thanks

## 2016-06-04 NOTE — Telephone Encounter (Signed)
Per verbal order from Dr Johnnye Sima, renewed keflex 500 mg Twice daily.  Patient moved from Dr Megan Salon to Dr  Algis Downs schedule.  Patient notified, will call if there is any conflict. Landis Gandy, RN

## 2016-06-04 NOTE — Patient Outreach (Signed)
[facility]) Care Management  06/04/2016  TREASE BREMNER Oct 31, 1940 [id number]   Subjective: client reports her legs are improving stating "the color has gone from red to bright pink".  Objectve: none-telephonic call  Assessment: Referral received on 3/21. Client has been admitted 3 times since November 2017. Last admission 3/9-3/15 with Cellulitis of Right lower Leg. RNCM called for transition of care.   Medications reviewed. Client with no issues regarding medications.  Home health active with [facility] for nursing, physical therapy and bath aide.  Client with no issues or questions at this time.  RNCM provided contact number. RNCM provided 24 hour nurse contact number and encouraged client to call as needed.  Plan: home visit next week.  Thea Silversmith, RN, MSN, [place] Coordinator Cell: [phone]

## 2016-06-11 ENCOUNTER — Ambulatory Visit: Payer: Self-pay

## 2016-06-11 ENCOUNTER — Other Ambulatory Visit: Payer: Self-pay

## 2016-06-11 NOTE — Patient Outreach (Addendum)
[facility]) Care Management  06/11/2016  Denise Kelly 1940-11-07 [id number]   Subjective: client declines home visit today. Reports nausea, denies vomiting, reports legs aching, denies fever, reports cellulitis continues to improve.  Objectve: none-telephonic call  Assessment: Referral received on 3/21. Client has been admitted 3 times since November 2017. Last admission 3/9-3/15 with Cellulitis of Right lower Leg.   RNCM called to notify client of pending arrival. [name] declines home visit this morning. States she has some nausea for a couple day. Denies vomiting. Reports she got her new walker and her legs are aching-physical therapist last seen on yesterday and states her nurse will be out on Friday. RNCM reminded client she could take tylenol for discomfort. Client reports her daughter is aware of the nausea.  Re: medications-client reports she has all her medications and is taking medications as ordered.   RNCM recommended soft food, liquids, advised if condition worsened to notify primary care. Also reinforced 24 hour nurse advice line. When confirming the number client states she does not know if she has the number. Request that RNCM call back and leave the number on her voice message because she was sleeping at the time. Declines to re-schedulde visit at this time.  RNCM called and left 24hour nurse advice line on client's voice message.  RNCM encouraged client to call as needed.  Plan: call later in the week to reschedule home visit.  Thea Silversmith, RN, MSN, [place] Coordinator Cell: [phone]

## 2016-06-12 ENCOUNTER — Other Ambulatory Visit: Payer: Self-pay

## 2016-06-12 NOTE — Patient Outreach (Signed)
[name]  06/12/2016  Denise Kelly 08-30-1940 [id number]  RNCM called to follow up rescheduled initial home visit. No answer. HIPPA compliant message left.  Plan: await return call and follow up within the week if no return call.  Thea Silversmith, RN, MSN, [place] Coordinator Cell: [phone]

## 2016-06-14 ENCOUNTER — Emergency Department (HOSPITAL_COMMUNITY): Payer: Medicare Other

## 2016-06-14 ENCOUNTER — Encounter (HOSPITAL_COMMUNITY): Payer: Self-pay | Admitting: Vascular Surgery

## 2016-06-14 ENCOUNTER — Inpatient Hospital Stay (HOSPITAL_COMMUNITY)
Admission: EM | Admit: 2016-06-14 | Discharge: 2016-06-17 | DRG: 872 | Disposition: A | Discharge status: Home Health | Payer: Medicare Other | Attending: Internal Medicine | Admitting: Internal Medicine

## 2016-06-14 DIAGNOSIS — E872 Acidosis, unspecified: Secondary | ICD-10-CM | POA: Diagnosis present

## 2016-06-14 DIAGNOSIS — R748 Abnormal levels of other serum enzymes: Secondary | ICD-10-CM

## 2016-06-14 DIAGNOSIS — Z8249 Family history of ischemic heart disease and other diseases of the circulatory system: Secondary | ICD-10-CM | POA: Diagnosis not present

## 2016-06-14 DIAGNOSIS — G8929 Other chronic pain: Secondary | ICD-10-CM | POA: Diagnosis present

## 2016-06-14 DIAGNOSIS — Z85828 Personal history of other malignant neoplasm of skin: Secondary | ICD-10-CM

## 2016-06-14 DIAGNOSIS — Z7982 Long term (current) use of aspirin: Secondary | ICD-10-CM

## 2016-06-14 DIAGNOSIS — L0390 Cellulitis, unspecified: Secondary | ICD-10-CM | POA: Diagnosis not present

## 2016-06-14 DIAGNOSIS — I10 Essential (primary) hypertension: Secondary | ICD-10-CM | POA: Diagnosis present

## 2016-06-14 DIAGNOSIS — Z87891 Personal history of nicotine dependence: Secondary | ICD-10-CM | POA: Diagnosis not present

## 2016-06-14 DIAGNOSIS — F1020 Alcohol dependence, uncomplicated: Secondary | ICD-10-CM

## 2016-06-14 DIAGNOSIS — A419 Sepsis, unspecified organism: Principal | ICD-10-CM | POA: Diagnosis present

## 2016-06-14 DIAGNOSIS — R410 Disorientation, unspecified: Secondary | ICD-10-CM

## 2016-06-14 DIAGNOSIS — F329 Major depressive disorder, single episode, unspecified: Secondary | ICD-10-CM | POA: Diagnosis present

## 2016-06-14 DIAGNOSIS — Z79818 Long term (current) use of other agents affecting estrogen receptors and estrogen levels: Secondary | ICD-10-CM | POA: Diagnosis not present

## 2016-06-14 DIAGNOSIS — L03115 Cellulitis of right lower limb: Secondary | ICD-10-CM | POA: Diagnosis present

## 2016-06-14 DIAGNOSIS — E876 Hypokalemia: Secondary | ICD-10-CM | POA: Diagnosis present

## 2016-06-14 DIAGNOSIS — M549 Dorsalgia, unspecified: Secondary | ICD-10-CM | POA: Diagnosis present

## 2016-06-14 DIAGNOSIS — R7989 Other specified abnormal findings of blood chemistry: Secondary | ICD-10-CM | POA: Diagnosis not present

## 2016-06-14 DIAGNOSIS — W19XXXA Unspecified fall, initial encounter: Secondary | ICD-10-CM

## 2016-06-14 DIAGNOSIS — H9190 Unspecified hearing loss, unspecified ear: Secondary | ICD-10-CM | POA: Diagnosis present

## 2016-06-14 DIAGNOSIS — F1021 Alcohol dependence, in remission: Secondary | ICD-10-CM | POA: Diagnosis present

## 2016-06-14 DIAGNOSIS — Z79899 Other long term (current) drug therapy: Secondary | ICD-10-CM

## 2016-06-14 DIAGNOSIS — D72829 Elevated white blood cell count, unspecified: Secondary | ICD-10-CM | POA: Diagnosis not present

## 2016-06-14 DIAGNOSIS — K219 Gastro-esophageal reflux disease without esophagitis: Secondary | ICD-10-CM | POA: Diagnosis present

## 2016-06-14 DIAGNOSIS — R6520 Severe sepsis without septic shock: Secondary | ICD-10-CM | POA: Diagnosis present

## 2016-06-14 DIAGNOSIS — F410 Panic disorder [episodic paroxysmal anxiety] without agoraphobia: Secondary | ICD-10-CM | POA: Diagnosis present

## 2016-06-14 DIAGNOSIS — R6510 Systemic inflammatory response syndrome (SIRS) of non-infectious origin without acute organ dysfunction: Secondary | ICD-10-CM | POA: Diagnosis present

## 2016-06-14 DIAGNOSIS — R4182 Altered mental status, unspecified: Secondary | ICD-10-CM | POA: Diagnosis not present

## 2016-06-14 DIAGNOSIS — N179 Acute kidney failure, unspecified: Secondary | ICD-10-CM | POA: Diagnosis present

## 2016-06-14 LAB — URINALYSIS, ROUTINE W REFLEX MICROSCOPIC
BILIRUBIN URINE: NEGATIVE
BILIRUBIN URINE: NEGATIVE
Bacteria, UA: NONE SEEN
Bacteria, UA: NONE SEEN
GLUCOSE, UA: NEGATIVE mg/dL
Glucose, UA: NEGATIVE mg/dL
KETONES UR: NEGATIVE mg/dL
Ketones, ur: 5 mg/dL — AB
LEUKOCYTES UA: NEGATIVE
Nitrite: NEGATIVE
Nitrite: NEGATIVE
PH: 6 (ref 5.0–8.0)
PROTEIN: NEGATIVE mg/dL
Protein, ur: NEGATIVE mg/dL
SPECIFIC GRAVITY, URINE: 1.009 (ref 1.005–1.030)
Specific Gravity, Urine: 1.01 (ref 1.005–1.030)
pH: 6 (ref 5.0–8.0)

## 2016-06-14 LAB — CBC WITH DIFFERENTIAL/PLATELET
BASOS PCT: 0 %
Basophils Absolute: 0 10*3/uL (ref 0.0–0.1)
EOS ABS: 0 10*3/uL (ref 0.0–0.7)
Eosinophils Relative: 0 %
HEMATOCRIT: 48.8 % — AB (ref 36.0–46.0)
Hemoglobin: 17.2 g/dL — ABNORMAL HIGH (ref 12.0–15.0)
Lymphocytes Relative: 9 %
Lymphs Abs: 1.3 10*3/uL (ref 0.7–4.0)
MCH: 31 pg (ref 26.0–34.0)
MCHC: 35.2 g/dL (ref 30.0–36.0)
MCV: 87.9 fL (ref 78.0–100.0)
Monocytes Absolute: 1.3 10*3/uL — ABNORMAL HIGH (ref 0.1–1.0)
Monocytes Relative: 9 %
NEUTROS ABS: 12 10*3/uL — AB (ref 1.7–7.7)
NEUTROS PCT: 82 %
Platelets: 227 10*3/uL (ref 150–400)
RBC: 5.55 MIL/uL — ABNORMAL HIGH (ref 3.87–5.11)
RDW: 12.7 % (ref 11.5–15.5)
WBC: 14.6 10*3/uL — ABNORMAL HIGH (ref 4.0–10.5)

## 2016-06-14 LAB — BLOOD GAS, ARTERIAL
Acid-Base Excess: 4.1 mmol/L — ABNORMAL HIGH (ref 0.0–2.0)
BICARBONATE: 26.2 mmol/L (ref 20.0–28.0)
Drawn by: 283381
O2 Content: 3 L/min
O2 SAT: 97 %
PATIENT TEMPERATURE: 98.8
PCO2 ART: 26.7 mmHg — AB (ref 32.0–48.0)
PO2 ART: 85.2 mmHg (ref 83.0–108.0)
pH, Arterial: 7.597 — ABNORMAL HIGH (ref 7.350–7.450)

## 2016-06-14 LAB — MAGNESIUM: MAGNESIUM: 1.5 mg/dL — AB (ref 1.7–2.4)

## 2016-06-14 LAB — COMPREHENSIVE METABOLIC PANEL
ALK PHOS: 134 U/L — AB (ref 38–126)
ALT: 24 U/L (ref 14–54)
AST: 57 U/L — ABNORMAL HIGH (ref 15–41)
Albumin: 3.8 g/dL (ref 3.5–5.0)
Anion gap: 18 — ABNORMAL HIGH (ref 5–15)
BILIRUBIN TOTAL: 1.6 mg/dL — AB (ref 0.3–1.2)
BUN: 8 mg/dL (ref 6–20)
CALCIUM: 9.2 mg/dL (ref 8.9–10.3)
CO2: 28 mmol/L (ref 22–32)
Chloride: 95 mmol/L — ABNORMAL LOW (ref 101–111)
Creatinine, Ser: 1.08 mg/dL — ABNORMAL HIGH (ref 0.44–1.00)
GFR, EST AFRICAN AMERICAN: 57 mL/min — AB (ref >60)
GFR, EST NON AFRICAN AMERICAN: 49 mL/min — AB (ref >60)
Glucose, Bld: 141 mg/dL — ABNORMAL HIGH (ref 65–99)
Potassium: 2.7 mmol/L — CL (ref 3.5–5.1)
Sodium: 141 mmol/L (ref 135–145)
TOTAL PROTEIN: 7.1 g/dL (ref 6.5–8.1)

## 2016-06-14 LAB — LACTIC ACID, PLASMA
LACTIC ACID, VENOUS: 1.4 mmol/L (ref 0.5–1.9)
Lactic Acid, Venous: 1.8 mmol/L (ref 0.5–1.9)

## 2016-06-14 LAB — CBC
HCT: 41.8 % (ref 36.0–46.0)
HEMOGLOBIN: 14 g/dL (ref 12.0–15.0)
MCH: 29.6 pg (ref 26.0–34.0)
MCHC: 33.5 g/dL (ref 30.0–36.0)
MCV: 88.4 fL (ref 78.0–100.0)
PLATELETS: 173 10*3/uL (ref 150–400)
RBC: 4.73 MIL/uL (ref 3.87–5.11)
RDW: 13.1 % (ref 11.5–15.5)
WBC: 12.2 10*3/uL — AB (ref 4.0–10.5)

## 2016-06-14 LAB — ETHANOL

## 2016-06-14 LAB — RAPID URINE DRUG SCREEN, HOSP PERFORMED
AMPHETAMINES: NOT DETECTED
BENZODIAZEPINES: NOT DETECTED
Barbiturates: NOT DETECTED
COCAINE: NOT DETECTED
OPIATES: NOT DETECTED
Tetrahydrocannabinol: NOT DETECTED

## 2016-06-14 LAB — I-STAT CG4 LACTIC ACID, ED: Lactic Acid, Venous: 5.4 mmol/L (ref 0.5–1.9)

## 2016-06-14 LAB — PROCALCITONIN: Procalcitonin: <0.1 ng/mL

## 2016-06-14 LAB — I-STAT TROPONIN, ED: TROPONIN I, POC: 0 ng/mL (ref 0.00–0.08)

## 2016-06-14 LAB — APTT: APTT: 31 s (ref 24–36)

## 2016-06-14 LAB — CREATININE, SERUM: CREATININE: 0.83 mg/dL (ref 0.44–1.00)

## 2016-06-14 LAB — TROPONIN I: Troponin I: 0.03 ng/mL (ref <0.03)

## 2016-06-14 LAB — PROTIME-INR
INR: 1.17
Prothrombin Time: 15 seconds (ref 11.4–15.2)

## 2016-06-14 LAB — CK: CK TOTAL: 471 U/L — AB (ref 38–234)

## 2016-06-14 MED ORDER — LEVALBUTEROL HCL 0.63 MG/3ML IN NEBU: 0.6300 mg | INHALATION_SOLUTION | Freq: Four times a day (QID) | RESPIRATORY_TRACT | Status: DC | PRN

## 2016-06-14 MED ORDER — PIPERACILLIN-TAZOBACTAM 3.375 G IVPB 30 MIN
3.3750 g | Freq: Once | INTRAVENOUS | Status: AC
  Administered 2016-06-14: 3.375 g via INTRAVENOUS
  Filled 2016-06-14: qty 50

## 2016-06-14 MED ORDER — GABAPENTIN 100 MG PO CAPS
200.0000 mg | ORAL_CAPSULE | Freq: Every day | ORAL | Status: DC
  Administered 2016-06-15 – 2016-06-17 (×3): 200 mg via ORAL
  Filled 2016-06-14 (×3): qty 2

## 2016-06-14 MED ORDER — LORAZEPAM 2 MG/ML IJ SOLN
1.0000 mg | Freq: Once | INTRAMUSCULAR | Status: AC
  Administered 2016-06-14: 1 mg via INTRAVENOUS
  Filled 2016-06-14: qty 1

## 2016-06-14 MED ORDER — ACETAMINOPHEN 500 MG PO TABS
1000.0000 mg | ORAL_TABLET | Freq: Once | ORAL | Status: AC
  Administered 2016-06-14: 1000 mg via ORAL
  Filled 2016-06-14: qty 2

## 2016-06-14 MED ORDER — CITALOPRAM HYDROBROMIDE 20 MG PO TABS
20.0000 mg | ORAL_TABLET | Freq: Every day | ORAL | Status: DC
  Administered 2016-06-14 – 2016-06-16 (×3): 20 mg via ORAL
  Filled 2016-06-14 (×3): qty 1

## 2016-06-14 MED ORDER — FENTANYL CITRATE (PF) 100 MCG/2ML IJ SOLN
50.0000 ug | Freq: Once | INTRAMUSCULAR | Status: AC
  Administered 2016-06-14: 50 ug via INTRAVENOUS
  Filled 2016-06-14: qty 2

## 2016-06-14 MED ORDER — SODIUM CHLORIDE 0.9 % IV BOLUS (SEPSIS)
1000.0000 mL | Freq: Once | INTRAVENOUS | Status: AC
  Administered 2016-06-14: 1000 mL via INTRAVENOUS

## 2016-06-14 MED ORDER — ONDANSETRON HCL 4 MG/2ML IJ SOLN
4.0000 mg | Freq: Four times a day (QID) | INTRAMUSCULAR | Status: DC | PRN
  Administered 2016-06-14 – 2016-06-16 (×4): 4 mg via INTRAVENOUS
  Filled 2016-06-14 (×4): qty 2

## 2016-06-14 MED ORDER — TRAMADOL HCL 50 MG PO TABS
50.0000 mg | ORAL_TABLET | Freq: Four times a day (QID) | ORAL | Status: DC | PRN
  Administered 2016-06-16 – 2016-06-17 (×2): 50 mg via ORAL
  Filled 2016-06-14 (×2): qty 1

## 2016-06-14 MED ORDER — SODIUM CHLORIDE 0.9 % IV BOLUS (SEPSIS)
2000.0000 mL | Freq: Once | INTRAVENOUS | Status: AC
  Administered 2016-06-14: 2000 mL via INTRAVENOUS

## 2016-06-14 MED ORDER — OXYCODONE HCL 5 MG PO TABS
5.0000 mg | ORAL_TABLET | Freq: Four times a day (QID) | ORAL | Status: DC | PRN
  Administered 2016-06-14 – 2016-06-17 (×10): 5 mg via ORAL
  Filled 2016-06-14 (×10): qty 1

## 2016-06-14 MED ORDER — WHITE PETROLATUM GEL
Status: DC | PRN
  Administered 2016-06-14: 17:00:00 via TOPICAL
  Filled 2016-06-14: qty 28.35

## 2016-06-14 MED ORDER — ACETAMINOPHEN 650 MG RE SUPP: 650.0000 mg | Freq: Four times a day (QID) | RECTAL | Status: DC | PRN

## 2016-06-14 MED ORDER — VANCOMYCIN HCL IN DEXTROSE 1-5 GM/200ML-% IV SOLN
1000.0000 mg | Freq: Once | INTRAVENOUS | Status: AC
  Administered 2016-06-14: 1000 mg via INTRAVENOUS
  Filled 2016-06-14: qty 200

## 2016-06-14 MED ORDER — PRAMIPEXOLE DIHYDROCHLORIDE 0.25 MG PO TABS
0.3750 mg | ORAL_TABLET | Freq: Two times a day (BID) | ORAL | Status: DC | PRN
  Filled 2016-06-14: qty 1

## 2016-06-14 MED ORDER — VANCOMYCIN HCL IN DEXTROSE 750-5 MG/150ML-% IV SOLN
750.0000 mg | Freq: Two times a day (BID) | INTRAVENOUS | Status: DC
  Administered 2016-06-15 – 2016-06-16 (×3): 750 mg via INTRAVENOUS
  Filled 2016-06-14 (×6): qty 150

## 2016-06-14 MED ORDER — ENOXAPARIN SODIUM 40 MG/0.4ML ~~LOC~~ SOLN
40.0000 mg | SUBCUTANEOUS | Status: DC
  Administered 2016-06-14 – 2016-06-16 (×3): 40 mg via SUBCUTANEOUS
  Filled 2016-06-14 (×3): qty 0.4

## 2016-06-14 MED ORDER — FOLIC ACID 1 MG PO TABS
1.0000 mg | ORAL_TABLET | Freq: Every day | ORAL | Status: DC
  Administered 2016-06-14 – 2016-06-17 (×4): 1 mg via ORAL
  Filled 2016-06-14 (×4): qty 1

## 2016-06-14 MED ORDER — VITAMIN B-1 100 MG PO TABS
100.0000 mg | ORAL_TABLET | Freq: Every day | ORAL | Status: DC
  Administered 2016-06-14 – 2016-06-17 (×4): 100 mg via ORAL
  Filled 2016-06-14 (×4): qty 1

## 2016-06-14 MED ORDER — ACETAMINOPHEN 325 MG PO TABS
650.0000 mg | ORAL_TABLET | Freq: Four times a day (QID) | ORAL | Status: DC | PRN
  Administered 2016-06-14 – 2016-06-17 (×3): 650 mg via ORAL
  Filled 2016-06-14 (×3): qty 2

## 2016-06-14 MED ORDER — PIPERACILLIN-TAZOBACTAM 3.375 G IVPB
3.3750 g | Freq: Three times a day (TID) | INTRAVENOUS | Status: DC
  Administered 2016-06-14 – 2016-06-16 (×6): 3.375 g via INTRAVENOUS
  Filled 2016-06-14 (×9): qty 50

## 2016-06-14 MED ORDER — MAGNESIUM SULFATE 2 GM/50ML IV SOLN: 2.0000 g | Freq: Once | INTRAVENOUS | Status: DC

## 2016-06-14 MED ORDER — ONDANSETRON HCL 4 MG PO TABS
4.0000 mg | ORAL_TABLET | Freq: Four times a day (QID) | ORAL | Status: DC | PRN
  Administered 2016-06-15 – 2016-06-17 (×2): 4 mg via ORAL
  Filled 2016-06-14 (×2): qty 1

## 2016-06-14 MED ORDER — GABAPENTIN 100 MG PO CAPS: 100.0000 mg | ORAL_CAPSULE | Freq: Two times a day (BID) | ORAL | Status: DC

## 2016-06-14 MED ORDER — SODIUM CHLORIDE 0.9 % IV SOLN
INTRAVENOUS | Status: DC
  Administered 2016-06-14 – 2016-06-15 (×2): via INTRAVENOUS
  Filled 2016-06-14 (×5): qty 1000

## 2016-06-14 MED ORDER — SODIUM CHLORIDE 0.9 % IV SOLN
30.0000 meq | Freq: Once | INTRAVENOUS | Status: AC
  Administered 2016-06-14: 30 meq via INTRAVENOUS
  Filled 2016-06-14: qty 15

## 2016-06-14 MED ORDER — ESTRADIOL 0.1 MG/GM VA CREA: 1.0000 | TOPICAL_CREAM | Freq: Every evening | VAGINAL | Status: DC | PRN

## 2016-06-14 MED ORDER — GABAPENTIN 300 MG PO CAPS
300.0000 mg | ORAL_CAPSULE | Freq: Every day | ORAL | Status: DC
  Administered 2016-06-14 – 2016-06-16 (×3): 300 mg via ORAL
  Filled 2016-06-14 (×3): qty 1

## 2016-06-14 NOTE — ED Notes (Signed)
Pt returns from radiology placed back on tele. 

## 2016-06-14 NOTE — H&P (Addendum)
[facility] History and Physical  Denise Kelly GYF:[id number] DOB: November 24, 1940 DOA: 06/14/2016  Referring physician:   PCP: Jonathon Bellows, MD   Chief Complaint: Fall   HPI:  76 y.o.femalewith medical history significant of hypertension, GERD, depression, hearing loss, skin cancer, former alcohol abuser, chronic back pain, who presents to the ER after a fall. Patient brought in via EMS, after being found by her neighbor. Patient fell sometime last night, does not remember the exact time. She could not get off the floor and fell asleep on her carpet. She denied any head injury or loss of consciousness. Patient thinks it was a mechanical fall. She does not remember any syncopal or near-syncopal symptoms. Patient repeatedly falling asleep during this interview. She states that she hurts all over. She has a prior history of alcohol use but denies any ongoing usage over this weekend. Patient was recently hospitalized and discharged between 3/9-3/15, for recurrent cellulitis. Infectious disease was consulted. Patient underwent MRI to rule out deeper infection which was negative. She was thought to have a degree of venous insufficiency with known history of total knee replacement, initially placed on vancomycin and Rocephin and subsequently treated with Zosyn and then Keflex for a total of 3-4 weeks. ED course BP 129/86   Pulse (!) 101   Temp 99.9 F (37.7 C) (Rectal)   Resp 19   SpO2 98%  Patient difficult to arouse, does not seem to be in any acute cardiopulmonary distress Calcium 2.7, glucose 141, creatinine 1.08, white blood cell count 14.6, lactic acid 5.4    Review of Systems: negative for the following  Constitutional: Positive for chills and fatigue. Negative for fever.  HENT: Negative for ear pain and sore throat.   Eyes: Negative for pain and visual disturbance.  Respiratory: Positive for shortness of breath. Negative for cough and chest tightness.   Cardiovascular:  Negative for chest pain, palpitations and leg swelling.  Gastrointestinal: Positive for abdominal pain, nausea and vomiting. Negative for abdominal distention, constipation and diarrhea.  Genitourinary: Negative for dysuria and hematuria.  Musculoskeletal: Positive for arthralgias, myalgias, neck pain and neck stiffness. Negative for back pain.  Skin: Negative for color change and rash.  Neurological: Positive for weakness. Negative for dizziness, seizures and syncope.  All other systems reviewed and are negative.      Past Medical History:  Diagnosis Date  . Alcohol abuse    ETOH and xanax  . Anxiety    Panic attacks   . Arthritis   . Cancer ([place])    skin- basal , squamous , melonoma- right hand  . Cellulitis    right leg  . Complication of anesthesia 2009   "felt drunk for a week after"- AVM- both times- felt drunk  . Depression   . Encephalopathy   . GERD (gastroesophageal reflux disease)   . HOH (hard of hearing)   . HOH (hard of hearing)   . Hypertension    not on mediacations  . Palpitations   . UTI (urinary tract infection)    frequently     Past Surgical History:  Procedure Laterality Date  . BREAST SURGERY Right    2 breast biopsies  . 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 COLONOSCOPY  2011   polyps  . EYE SURGERY Bilateral    cataracts  . INTRAMEDULLARY (IM) NAIL INTERTROCHANTERIC Left 11/29/2015   Procedure: LEFT HIP   NAIL;  Surgeon: Renette Butters, MD;  Location: [facility];  Service: Orthopedics;  Laterality: Left;  . JOINT REPLACEMENT Left 09/2009   knee  . TONSILLECTOMY    . TOTAL KNEE ARTHROPLASTY Right 01/02/2014   Procedure: TOTAL KNEE ARTHROPLASTY;  Surgeon: [name], MD;  Location: [facility];  Service: Orthopedics;  Laterality: Right;  . TUBAL LIGATION        Social History:  reports that she has quit smoking. She quit after 20.00 years of use. She has never used smokeless tobacco. She reports that she does not drink alcohol or  use drugs.    Allergies  Allergen Reactions  . Sulfa Antibiotics Hives, Swelling and Other (See Comments)    Reaction:  Facial/eye swelling   . Ciprofloxacin Itching  . Motrin [Ibuprofen] Palpitations    Family History  Problem Relation Age of Onset  . Hypertension Other   . Cancer Mother   . Cancer Father         Prior to Admission medications   Medication Sig Start Date End Date Taking? Authorizing Provider  acetaminophen (TYLENOL) 500 MG tablet Take 1,000 mg by mouth every 6 (six) hours as needed for mild pain, moderate pain or headache.    Yes Historical Provider, MD  aspirin EC 81 MG tablet Take 81 mg by mouth at bedtime.    Yes Historical Provider, MD  cephALEXin (KEFLEX) 500 MG capsule Take 1 capsule (500 mg total) by mouth 2 (two) times daily. Continue until seen by Dr Johnnye Sima 4/18. 06/04/16 07/04/16 Yes Campbell Riches, MD  citalopram (CELEXA) 20 MG tablet Take 1 tablet (20 mg total) by mouth at bedtime. 02/24/14  Yes [name], MD  estradiol (ESTRACE) 0.1 MG/GM vaginal cream Place 1 Applicatorful vaginally at bedtime as needed (for vaginal discomfort).   Yes Historical Provider, MD  furosemide (LASIX) 20 MG tablet Take 1 tablet (20 mg total) by mouth daily. 05/30/16  Yes Silver Huguenin Elgergawy, MD  gabapentin (NEURONTIN) 100 MG capsule Take 100-300 mg by mouth 2 (two) times daily. 200 mg in the morning & 300 mg at bedtime   Yes Historical Provider, MD  hydrOXYzine (ATARAX/VISTARIL) 25 MG tablet Take 50 mg by mouth at bedtime as needed for anxiety (and/or sleep).    Yes Historical Provider, MD  loratadine (CLARITIN) 10 MG tablet Take 10 mg by mouth daily.    Yes Historical Provider, MD  methocarbamol (ROBAXIN) 500 MG tablet Take 250-500 mg by mouth 2 (two) times daily as needed (for leg cramps/spasms.).    Yes Historical Provider, MD  ondansetron (ZOFRAN-ODT) 4 MG disintegrating tablet Take 4 mg by mouth every 6 (six) hours as needed for nausea or vomiting.    Yes Historical  Provider, MD  oxyCODONE (OXY IR/ROXICODONE) 5 MG immediate release tablet Take 1 tablet (5 mg total) by mouth every 6 (six) hours as needed for severe pain. 05/29/16  Yes Albertine Patricia, MD  Polyethyl Glycol-Propyl Glycol (SYSTANE OP) Apply 1-2 drops to eye 3 (three) times daily as needed (for dry eyes).   Yes Historical Provider, MD  potassium chloride (K-DUR) 10 MEQ tablet Take 1 tablet (10 mEq total) by mouth daily. 05/29/16  Yes Albertine Patricia, MD  pramipexole (MIRAPEX) 0.125 MG tablet Take 0.375 mg by mouth 2 (two) times daily as needed (for restless leg syndrome).    Yes Historical Provider, MD  senna-docusate (SENOKOT S) 8.6-50 MG tablet Take 1 tablet by mouth 2 (two) times daily. 02/28/16  Yes Eugenie Filler, MD  traZODone (DESYREL) 100 MG tablet Take 100 mg  by mouth at bedtime as needed for sleep.   Yes Historical Provider, MD  zolpidem (AMBIEN) 10 MG tablet Take 5 mg by mouth at bedtime as needed for sleep.   Yes Historical Provider, MD  acidophilus (RISAQUAD) CAPS capsule Take 2 capsules by mouth daily. Patient not taking: Reported on 06/14/2016 05/29/16   Albertine Patricia, MD  folic acid (FOLVITE) 1 MG tablet Take 1 tablet (1 mg total) by mouth daily. Patient not taking: Reported on 05/07/2016 02/29/16   Eugenie Filler, MD  ipratropium-albuterol (DUONEB) 0.5-2.5 (3) MG/3ML SOLN Take 3 mLs by nebulization every 4 (four) hours as needed. Patient not taking: Reported on 05/07/2016 02/28/16   Eugenie Filler, MD  thiamine 100 MG tablet Take 1 tablet (100 mg total) by mouth daily. Patient not taking: Reported on 05/07/2016 02/29/16   Eugenie Filler, MD  traMADol (ULTRAM) 50 MG tablet Take 1 tablet (50 mg total) by mouth every 6 (six) hours as needed for moderate pain. Patient not taking: Reported on 05/23/2016 02/28/16   Eugenie Filler, MD     Physical Exam: Vitals:   06/14/16 1600 06/14/16 1615 06/14/16 1630 06/14/16 1645  BP: (!) 149/97 110/79 129/86 129/86  Pulse: 76  (!) 109 (!) 102 (!) 101  Resp: (!) 21 19 (!) 29 19  Temp:      TempSrc:      SpO2: 95% 95% 97% 98%        Vitals:   06/14/16 1600 06/14/16 1615 06/14/16 1630 06/14/16 1645  BP: (!) 149/97 110/79 129/86 129/86  Pulse: 76 (!) 109 (!) 102 (!) 101  Resp: (!) 21 19 (!) 29 19  Temp:      TempSrc:      SpO2: 95% 95% 97% 98%  Constitutional: She appears well-developed and well-nourished.  Non-toxic appearance. She does not have a sickly appearance. She appears ill. She appears distressed.  Appears anxious  HENT:  Head: Normocephalic and atraumatic. Head is without raccoon's eyes, without Battle's sign, without contusion and without laceration.  Eyes: Conjunctivae are normal. Pupils are equal, round, and reactive to light. Right eye exhibits no discharge. Left eye exhibits no discharge. No scleral icterus.  Neck:  . No tracheal deviation present.  Cardiovascular: Regular rhythm, normal heart sounds and intact distal pulses.  Exam reveals no gallop and no friction rub.   No murmur heard. Pulmonary/Chest: Breath sounds normal. No stridor. Tachypnea  . No respiratory distress. She has no wheezes. She exhibits no tenderness.  Abdominal: Soft. Bowel sounds are normal. She exhibits no distension. There is tenderness (Diffuse upper quadrant pain).  Musculoskeletal: She exhibits no edema.  Bruising to left elbow  Neurological: She is alert. No cranial nerve deficit or sensory deficit.  Skin: Skin is warm and dry. Ecchymosis noted. No rash noted. She is not diaphoretic. .     Labs on Admission: I have personally reviewed following labs and imaging studies  CBC:  Recent Labs Lab 06/14/16 1159  WBC 14.6*  NEUTROABS 12.0*  HGB 17.2*  HCT 48.8*  MCV 87.9  PLT 947    Basic Metabolic Panel:  Recent Labs Lab 06/14/16 1159  NA 141  K 2.7*  CL 95*  CO2 28  GLUCOSE 141*  BUN 8  CREATININE 1.08*  CALCIUM 9.2    GFR: CrCl cannot be calculated (Unknown ideal weight.).  Liver  Function Tests:  Recent Labs Lab 06/14/16 1159  AST 57*  ALT 24  ALKPHOS 134*  BILITOT 1.6*  PROT 7.1  ALBUMIN 3.8   No results for input(s): LIPASE, AMYLASE in the last 168 hours. No results for input(s): AMMONIA in the last 168 hours.  Coagulation Profile: No results for input(s): INR, PROTIME in the last 168 hours. No results for input(s): DDIMER in the last 72 hours.  Cardiac Enzymes:  Recent Labs Lab 06/14/16 1159  CKTOTAL 471*    BNP (last 3 results) No results for input(s): PROBNP in the last 8760 hours.  HbA1C: No results for input(s): HGBA1C in the last 72 hours. Lab Results  Component Value Date   HGBA1C 5.2 05/26/2016   HGBA1C 5.7 (H) 02/26/2016   HGBA1C 5.5 11/30/2014     CBG: No results for input(s): GLUCAP in the last 168 hours.  Lipid Profile: No results for input(s): CHOL, HDL, LDLCALC, TRIG, CHOLHDL, LDLDIRECT in the last 72 hours.  Thyroid Function Tests: No results for input(s): TSH, T4TOTAL, FREET4, T3FREE, THYROIDAB in the last 72 hours.  Anemia Panel: No results for input(s): VITAMINB12, FOLATE, FERRITIN, TIBC, IRON, RETICCTPCT in the last 72 hours.  Urine analysis:    Component Value Date/Time   COLORURINE YELLOW 06/14/2016 1628   APPEARANCEUR CLEAR 06/14/2016 1628   LABSPEC 1.010 06/14/2016 1628   PHURINE 6.0 06/14/2016 1628   GLUCOSEU NEGATIVE 06/14/2016 1628   HGBUR MODERATE (A) 06/14/2016 1628   BILIRUBINUR NEGATIVE 06/14/2016 1628   KETONESUR NEGATIVE 06/14/2016 1628   PROTEINUR NEGATIVE 06/14/2016 1628   UROBILINOGEN 1.0 11/30/2014 2105   NITRITE NEGATIVE 06/14/2016 1628   LEUKOCYTESUR NEGATIVE 06/14/2016 1628    Sepsis Labs: @LABRCNTIP (procalcitonin:4,lacticidven:4) )No results found for this or any previous visit (from the past 240 hour(s)).       Radiological Exams on Admission: Dg Chest 2 View  Result Date: 06/14/2016 CLINICAL DATA:  [place] last night, laid on carpeted floor all night long, complaining of  pain at RIGHT knee, RIGHT thigh, and RIGHT chin, some chest pain in route to hospital, history hypertension, GERD, former smoker, ethanol abuse EXAM: CHEST  2 VIEW COMPARISON:  02/25/2016 FINDINGS: Enlargement of cardiac silhouette. Mediastinal contours and pulmonary vascularity normal. Central peribronchial thickening without infiltrate, pleural effusion, or pneumothorax. Bones demineralized without fracture. IMPRESSION: Mild enlargement of cardiac silhouette. Bronchitic changes without infiltrate. Electronically Signed   By: Lavonia Dana M.D.   On: 06/14/2016 14:28   Dg Lumbar Spine Complete  Result Date: 06/14/2016 CLINICAL DATA:  [place] last night, laid on carpeted floor all night long, complaining of pain at RIGHT knee, RIGHT thigh, and RIGHT shin, some chest pain in route to hospital, septic, history hypertension, GERD, former smoker, ethanol abuse EXAM: LUMBAR SPINE - COMPLETE 4+ VIEW COMPARISON:  MRI lumbar spine 01/23/2016 FINDINGS: Severe diffuse osseous demineralization limiting assessment. Five non-rib-bearing lumbar vertebra. Multilevel disc space narrowing and endplate spur formation. Scattered facet degenerative changes. Chronic anterior height loss at L1 unchanged. Minimal anterolisthesis at L4-L5 and retrolisthesis at L1-L2 unchanged. Vertebral body heights maintained. No acute fracture, additional subluxation, or bone destruction. No gross evidence of spondylolysis. SI joints preserved. Scattered pelvic phleboliths and minimal atherosclerotic calcification aorta. IMPRESSION: Multilevel degenerative disc and facet disease changes with osseous demineralization. No definite acute bony abnormalities. Electronically Signed   By: Lavonia Dana M.D.   On: 06/14/2016 14:31   Dg Pelvis 1-2 Views  Result Date: 06/14/2016 CLINICAL DATA:  [place] last night, laid on carpeted floor all night long, complaining of pain at RIGHT knee, RIGHT thigh, and RIGHT shin, some chest pain in route to hospital, septic,  history hypertension, GERD, former smoker, ethanol abuse EXAM: PELVIS - 1-2 VIEW COMPARISON:  01/03/2014 FINDINGS: IM nail with compression screw at proximal LEFT femur post ORIF of an intertrochanteric fracture. Diffuse osseous demineralization. Hip and SI joint spaces preserved. No acute fracture, dislocation, or bone destruction. IMPRESSION: No acute osseous abnormalities. Osseous demineralization with prior ORIF of the proximal LEFT femur. Electronically Signed   By: Lavonia Dana M.D.   On: 06/14/2016 14:33   Ct Head Wo Contrast  Result Date: 06/14/2016 CLINICAL DATA:  Altered mental status. EXAM: CT HEAD WITHOUT CONTRAST CT CERVICAL SPINE WITHOUT CONTRAST TECHNIQUE: Multidetector CT imaging of the head and cervical spine was performed following the standard protocol without intravenous contrast. Multiplanar CT image reconstructions of the cervical spine were also generated. COMPARISON:  Brain MR dated 12/01/2014 and head CT dated 11/30/2014. FINDINGS: CT HEAD FINDINGS Brain: Streak artifacts from metal embolization coils at the skull base. Motion artifacts simulating subdural hematomas on image number 25 of series 5, not seen on repeat imaging through that area. Diffusely enlarged ventricles and subarachnoid spaces. Patchy white matter low density in both cerebral hemispheres. Stable old right basal ganglia lacunar infarct or prominent perivascular space. No intracranial hemorrhage, mass lesion or CT evidence of acute infarction. Vascular: No hyperdense vessel or unexpected calcification. Skull: Normal. Negative for fracture or focal lesion. Sinuses/Orbits: Status post bilateral cataract removal. The included portions of the paranasal sinuses are normally pneumatized. Other: Embolization coils in the region of the left cavernous sinus. CT CERVICAL SPINE FINDINGS Alignment: Reversal of the normal lordosis in the lower cervical spine. No subluxations. Skull base and vertebrae: No acute fracture. No primary  bone lesion or focal pathologic process. Soft tissues and spinal canal: No prevertebral fluid or swelling. No visible canal hematoma. Disc levels: Marked disc space narrowing at the C5-6 and C6-7 levels with mild to moderate anterior and mild posterior spur formation at those levels. Minimal anterior spur formation at the C7-T1 level. Upper chest: Small amount of right apical parenchymal scarring and left apical pleural and parenchymal scarring. Other: Motion artifacts. The images were repeated to eliminate these. IMPRESSION: 1. No acute intracranial or cervical spine abnormality. 2. Mildly progressive diffuse cerebral atrophy and chronic small vessel white matter ischemic changes in both cerebral hemispheres. 3. Cervical spine degenerative changes. Electronically Signed   By: Claudie Revering M.D.   On: 06/14/2016 15:49   Ct Cervical Spine Wo Contrast  Result Date: 06/14/2016 CLINICAL DATA:  Altered mental status. EXAM: CT HEAD WITHOUT CONTRAST CT CERVICAL SPINE WITHOUT CONTRAST TECHNIQUE: Multidetector CT imaging of the head and cervical spine was performed following the standard protocol without intravenous contrast. Multiplanar CT image reconstructions of the cervical spine were also generated. COMPARISON:  Brain MR dated 12/01/2014 and head CT dated 11/30/2014. FINDINGS: CT HEAD FINDINGS Brain: Streak artifacts from metal embolization coils at the skull base. Motion artifacts simulating subdural hematomas on image number 25 of series 5, not seen on repeat imaging through that area. Diffusely enlarged ventricles and subarachnoid spaces. Patchy white matter low density in both cerebral hemispheres. Stable old right basal ganglia lacunar infarct or prominent perivascular space. No intracranial hemorrhage, mass lesion or CT evidence of acute infarction. Vascular: No hyperdense vessel or unexpected calcification. Skull: Normal. Negative for fracture or focal lesion. Sinuses/Orbits: Status post bilateral cataract  removal. The included portions of the paranasal sinuses are normally pneumatized. Other: Embolization coils in the region of the left cavernous sinus. CT CERVICAL SPINE FINDINGS Alignment: Reversal of the  normal lordosis in the lower cervical spine. No subluxations. Skull base and vertebrae: No acute fracture. No primary bone lesion or focal pathologic process. Soft tissues and spinal canal: No prevertebral fluid or swelling. No visible canal hematoma. Disc levels: Marked disc space narrowing at the C5-6 and C6-7 levels with mild to moderate anterior and mild posterior spur formation at those levels. Minimal anterior spur formation at the C7-T1 level. Upper chest: Small amount of right apical parenchymal scarring and left apical pleural and parenchymal scarring. Other: Motion artifacts. The images were repeated to eliminate these. IMPRESSION: 1. No acute intracranial or cervical spine abnormality. 2. Mildly progressive diffuse cerebral atrophy and chronic small vessel white matter ischemic changes in both cerebral hemispheres. 3. Cervical spine degenerative changes. Electronically Signed   By: Claudie Revering M.D.   On: 06/14/2016 15:49   Dg Femur Min 2 Views Right  Result Date: 06/14/2016 CLINICAL DATA:  [place] last night, laid on carpeted floor all night long, complaining of pain at RIGHT knee, RIGHT thigh, and RIGHT shin, some chest pain in route to hospital, history hypertension, GERD, former smoker, ethanol abuse EXAM: RIGHT FEMUR 2 VIEWS COMPARISON:  None FINDINGS: Osseous demineralization. Hip joint space preserved. RIGHT knee prosthetic components noted without surrounding lucency. No acute fracture, dislocation, or bone destruction. IMPRESSION: No acute osseous abnormalities. Electronically Signed   By: Lavonia Dana M.D.   On: 06/14/2016 14:36   Dg Chest 2 View  Result Date: 06/14/2016 CLINICAL DATA:  [place] last night, laid on carpeted floor all night long, complaining of pain at RIGHT knee, RIGHT thigh,  and RIGHT chin, some chest pain in route to hospital, history hypertension, GERD, former smoker, ethanol abuse EXAM: CHEST  2 VIEW COMPARISON:  02/25/2016 FINDINGS: Enlargement of cardiac silhouette. Mediastinal contours and pulmonary vascularity normal. Central peribronchial thickening without infiltrate, pleural effusion, or pneumothorax. Bones demineralized without fracture. IMPRESSION: Mild enlargement of cardiac silhouette. Bronchitic changes without infiltrate. Electronically Signed   By: Lavonia Dana M.D.   On: 06/14/2016 14:28   Dg Lumbar Spine Complete  Result Date: 06/14/2016 CLINICAL DATA:  [place] last night, laid on carpeted floor all night long, complaining of pain at RIGHT knee, RIGHT thigh, and RIGHT shin, some chest pain in route to hospital, septic, history hypertension, GERD, former smoker, ethanol abuse EXAM: LUMBAR SPINE - COMPLETE 4+ VIEW COMPARISON:  MRI lumbar spine 01/23/2016 FINDINGS: Severe diffuse osseous demineralization limiting assessment. Five non-rib-bearing lumbar vertebra. Multilevel disc space narrowing and endplate spur formation. Scattered facet degenerative changes. Chronic anterior height loss at L1 unchanged. Minimal anterolisthesis at L4-L5 and retrolisthesis at L1-L2 unchanged. Vertebral body heights maintained. No acute fracture, additional subluxation, or bone destruction. No gross evidence of spondylolysis. SI joints preserved. Scattered pelvic phleboliths and minimal atherosclerotic calcification aorta. IMPRESSION: Multilevel degenerative disc and facet disease changes with osseous demineralization. No definite acute bony abnormalities. Electronically Signed   By: Lavonia Dana M.D.   On: 06/14/2016 14:31   Dg Pelvis 1-2 Views  Result Date: 06/14/2016 CLINICAL DATA:  [place] last night, laid on carpeted floor all night long, complaining of pain at RIGHT knee, RIGHT thigh, and RIGHT shin, some chest pain in route to hospital, septic, history hypertension, GERD, former  smoker, ethanol abuse EXAM: PELVIS - 1-2 VIEW COMPARISON:  01/03/2014 FINDINGS: IM nail with compression screw at proximal LEFT femur post ORIF of an intertrochanteric fracture. Diffuse osseous demineralization. Hip and SI joint spaces preserved. No acute fracture, dislocation, or bone destruction. IMPRESSION: No acute osseous  abnormalities. Osseous demineralization with prior ORIF of the proximal LEFT femur. Electronically Signed   By: Lavonia Dana M.D.   On: 06/14/2016 14:33   Ct Head Wo Contrast  Result Date: 06/14/2016 CLINICAL DATA:  Altered mental status. EXAM: CT HEAD WITHOUT CONTRAST CT CERVICAL SPINE WITHOUT CONTRAST TECHNIQUE: Multidetector CT imaging of the head and cervical spine was performed following the standard protocol without intravenous contrast. Multiplanar CT image reconstructions of the cervical spine were also generated. COMPARISON:  Brain MR dated 12/01/2014 and head CT dated 11/30/2014. FINDINGS: CT HEAD FINDINGS Brain: Streak artifacts from metal embolization coils at the skull base. Motion artifacts simulating subdural hematomas on image number 25 of series 5, not seen on repeat imaging through that area. Diffusely enlarged ventricles and subarachnoid spaces. Patchy white matter low density in both cerebral hemispheres. Stable old right basal ganglia lacunar infarct or prominent perivascular space. No intracranial hemorrhage, mass lesion or CT evidence of acute infarction. Vascular: No hyperdense vessel or unexpected calcification. Skull: Normal. Negative for fracture or focal lesion. Sinuses/Orbits: Status post bilateral cataract removal. The included portions of the paranasal sinuses are normally pneumatized. Other: Embolization coils in the region of the left cavernous sinus. CT CERVICAL SPINE FINDINGS Alignment: Reversal of the normal lordosis in the lower cervical spine. No subluxations. Skull base and vertebrae: No acute fracture. No primary bone lesion or focal pathologic  process. Soft tissues and spinal canal: No prevertebral fluid or swelling. No visible canal hematoma. Disc levels: Marked disc space narrowing at the C5-6 and C6-7 levels with mild to moderate anterior and mild posterior spur formation at those levels. Minimal anterior spur formation at the C7-T1 level. Upper chest: Small amount of right apical parenchymal scarring and left apical pleural and parenchymal scarring. Other: Motion artifacts. The images were repeated to eliminate these. IMPRESSION: 1. No acute intracranial or cervical spine abnormality. 2. Mildly progressive diffuse cerebral atrophy and chronic small vessel white matter ischemic changes in both cerebral hemispheres. 3. Cervical spine degenerative changes. Electronically Signed   By: Claudie Revering M.D.   On: 06/14/2016 15:49   Ct Cervical Spine Wo Contrast  Result Date: 06/14/2016 CLINICAL DATA:  Altered mental status. EXAM: CT HEAD WITHOUT CONTRAST CT CERVICAL SPINE WITHOUT CONTRAST TECHNIQUE: Multidetector CT imaging of the head and cervical spine was performed following the standard protocol without intravenous contrast. Multiplanar CT image reconstructions of the cervical spine were also generated. COMPARISON:  Brain MR dated 12/01/2014 and head CT dated 11/30/2014. FINDINGS: CT HEAD FINDINGS Brain: Streak artifacts from metal embolization coils at the skull base. Motion artifacts simulating subdural hematomas on image number 25 of series 5, not seen on repeat imaging through that area. Diffusely enlarged ventricles and subarachnoid spaces. Patchy white matter low density in both cerebral hemispheres. Stable old right basal ganglia lacunar infarct or prominent perivascular space. No intracranial hemorrhage, mass lesion or CT evidence of acute infarction. Vascular: No hyperdense vessel or unexpected calcification. Skull: Normal. Negative for fracture or focal lesion. Sinuses/Orbits: Status post bilateral cataract removal. The included portions of  the paranasal sinuses are normally pneumatized. Other: Embolization coils in the region of the left cavernous sinus. CT CERVICAL SPINE FINDINGS Alignment: Reversal of the normal lordosis in the lower cervical spine. No subluxations. Skull base and vertebrae: No acute fracture. No primary bone lesion or focal pathologic process. Soft tissues and spinal canal: No prevertebral fluid or swelling. No visible canal hematoma. Disc levels: Marked disc space narrowing at the C5-6 and C6-7 levels with  mild to moderate anterior and mild posterior spur formation at those levels. Minimal anterior spur formation at the C7-T1 level. Upper chest: Small amount of right apical parenchymal scarring and left apical pleural and parenchymal scarring. Other: Motion artifacts. The images were repeated to eliminate these. IMPRESSION: 1. No acute intracranial or cervical spine abnormality. 2. Mildly progressive diffuse cerebral atrophy and chronic small vessel white matter ischemic changes in both cerebral hemispheres. 3. Cervical spine degenerative changes. Electronically Signed   By: Claudie Revering M.D.   On: 06/14/2016 15:49   Mr Tibia Fibula Right Wo Contrast  Result Date: 05/24/2016 CLINICAL DATA:  Bilateral leg pain.  Cellulitis. EXAM: MRI OF LOWER RIGHT EXTREMITY WITHOUT CONTRAST TECHNIQUE: Multiplanar, multisequence MR imaging of the right lower extremity was performed. No intravenous contrast was administered. COMPARISON:  None. FINDINGS: Bones/Joint/Cartilage Bilateral total knee arthroplasties with susceptibility artifact partially obscuring the adjacent soft tissue and osseous structures. No periarticular fluid collection or osteolysis. No focal marrow signal abnormality. No fracture or dislocation. Normal alignment. No periosteal reaction or bone destruction. Ligaments, Muscles and Tendons Muscles are normal. No muscle edema. No intramuscular fluid collection. Soft tissue No fluid collection or hematoma. No soft tissue mass.  Asymmetric mild soft tissue edema and skin thickening of the right lower leg which may be secondary to mild cellulitis. IMPRESSION: 1. No osseous abnormality of the right tibia or fibula. 2. Asymmetric mild soft tissue edema and skin thickening of the right lower leg which may be secondary to mild cellulitis. No focal fluid collection to suggest an abscess. Electronically Signed   By: Kathreen Devoid   On: 05/24/2016 14:11   Dg Femur Min 2 Views Right  Result Date: 06/14/2016 CLINICAL DATA:  [place] last night, laid on carpeted floor all night long, complaining of pain at RIGHT knee, RIGHT thigh, and RIGHT shin, some chest pain in route to hospital, history hypertension, GERD, former smoker, ethanol abuse EXAM: RIGHT FEMUR 2 VIEWS COMPARISON:  None FINDINGS: Osseous demineralization. Hip joint space preserved. RIGHT knee prosthetic components noted without surrounding lucency. No acute fracture, dislocation, or bone destruction. IMPRESSION: No acute osseous abnormalities. Electronically Signed   By: Lavonia Dana M.D.   On: 06/14/2016 14:36      EKG: Independently reviewed.  EKG Interpretation  Date/Time:                  Saturday June 14 2016 11:37:29 EDT Ventricular Rate:   107 PR Interval:                        QRS Duration:        132 QT Interval:                      388 QTC Calculation:    518 R Axis:                         -68 Text Interpretation:  Sinus tachycardia Multiple premature complexes, vent & supraven RBBB and LAFB  Assessment/Plan Mechanical fall X-rays of right femur, pelvis, L-spine did not show any acute changes. Chest x-ray showed mild enlargement of the cardiac silhouette with no pneumonia present.  CT cervical spine revealed no acute  Abnormalities. CT head showed no evidence of intracranial hemorrhage, skull fracture, or other acute process  Patient was evaluated by home health during her last admission and home health PT was recommended  We will check orthostatics  when  able Given electrolyte abnormalities cannot rule out arrhythmias Polypharmacy could also be contributing as the patient is noted to be on multiple sedating medications   SIRS (systemic inflammatory response syndrome) (HCC) secondary to ongoing right lower extremity cellulitis/lactic acidosis Patient has a low-grade fever, leukocytosis Patient's lactate is elevated Patient has been started on vancomycin and Zosyn and started on fluids per sepsis protocol [patient was on Keflex 500 mg twice a day, was seen by infectious disease during her last admission] Follow-up blood cultures UA negative Continue IV hydration per sepsis protocol    Alcohol dependence ([place]) EtOH level was negative, UDS negative No indication for CIWA protocol  HTN Continue Lasix,  Hypokalemia Replete aggressively  Depression -Continue home meds  hx ofAlcohol dependence ([place]), in remission -pt states that she did not drink alcohol for 2 years.  Chronic back pain -prn percocet -continue neurontin  Bilateral leg edema - BNP normal, prior echo in December 2017 with normal EF, probably has a degree of venous insufficiency, recommen compression stockings once cellulitis is improvedd       DVT prophylaxis: lovenox     consults called:none   Family Communication: Admission, patients condition and plan of care including tests being ordered have been discussed with the patient  who indicates understanding and agree with the plan and Code Status  Admission status: inpatient    Disposition plan: Further plan will depend as patient's clinical course evolves and further radiologic and laboratory data become available. Likely home when stable   At the time of admission, it appears that the appropriate admission status for this patient is INPATIENT .Thisis judged to be reasonable and necessary in order to provide the required intensity of service to ensure the patient's safetygiven thepresenting  symptoms, physical exam findings, and initial radiographic and laboratory data in the context of their chronic comorbidities.   Reyne Dumas MD [facility] Pager [phone]  If 7PM-7AM, please contact night-coverage [email or web address] Password [facility]  06/14/2016, 5:37 PM

## 2016-06-14 NOTE — Progress Notes (Signed)
Attempted report 

## 2016-06-14 NOTE — ED Notes (Signed)
Attempted report 

## 2016-06-14 NOTE — ED Triage Notes (Signed)
Pt reports to the ED via [facility] from home where she fell last night and she laid on the carpeted floor all night long. She is complaining of pain to the right knee, thigh, and shin. En route she developed some chest pain but it has resolved en route. 12 lead obtained en route was unremarkable. She is also complaining of being cold, nausea, and having a dry mouth. She denies any head injury or LOC. She uses a walker at baseline but did not have it at the bedside. VSS en route. She did have Zofran PTA. She takes Zofran routinely for chronic nausea. She passed SCCA. Denies any blood thinner use. She was incontinent of urine. She states that she did get lightheaded prior to the fall.

## 2016-06-14 NOTE — Progress Notes (Signed)
Pharmacy Antibiotic Note  Denise Kelly is a 76 y.o. female admitted on 06/14/2016 with sepsis.  Pharmacy has been consulted for vancomycin and Zosyn dosing. Lactate elevated, SCr up from baseline (CrCl ~55 ml/min).   RN has already hung 1000mg  dose ordered by MD, so will order an additional 1000mg  dose to complete 2000mg  load.  Plan: -Vancomycin 2000mg  IV x1 then 750mg  IV q12hr -Zosyn 3.375g IV over 30 min x1 then 3.375g IV q8h EI -Monitor renal function, cultures, LOT -Obtain vancomycin level as indicated   Temp (24hrs), Avg:98.9 F (37.2 C), Min:97.8 F (36.6 C), Max:99.9 F (37.7 C)   Recent Labs Lab 06/14/16 1159 06/14/16 1258  WBC 14.6*  --   LATICACIDVEN  --  5.40*    CrCl cannot be calculated (Unknown ideal weight.).    Allergies  Allergen Reactions  . Sulfa Antibiotics Hives, Swelling and Other (See Comments)    Reaction:  Facial/eye swelling   . Ciprofloxacin Itching  . Motrin [Ibuprofen] Palpitations    Antimicrobials this admission: 3/31 Vancomycin >>  3/31 Zosyn >>   Dose adjustments this admission: none  Microbiology results: 3/31 BCx: IP  Thank you for allowing pharmacy to be a part of this patient's care.   Arrie Senate, PharmD PGY-1 Pharmacy Resident Pager: [phone] 06/14/2016

## 2016-06-14 NOTE — ED Provider Notes (Signed)
[facility] DEPT Provider Note   CSN: [id number] Arrival date & time: 06/14/16  1126     History   Chief Complaint Chief Complaint  Patient presents with  . Fall    HPI Denise Kelly is a 76 y.o. female.  She presents today for evaluation of fall.  She reports that last night she fell for an unknown reason and was unable to get up after. She reports having to spend the night on the floor.  She said she was found this morning by her neighbor.  She reports she lives with her daughter, but her daughter is currently at bedtime.  Patient reports that she hurts "all over" and feels like she is going to throw up.  Patient has a history of chronic nausea for which she takes Zofran. EMS reports that she had a total of 8mg  Zofran and 100 MCG fentanyl prior to arrival.  She is complaining of pain in her distal right femur.  She also endorses pain in the back of her neck, in her lower back, her hip/right sided pelvis and mild abdominal pain.  Patient is currently alert and oriented to person and place unable to recall the day, month, or season.  She reports feeling very cold.   She has a history of alcohol abuse.  Her daughter states that her mom "has a problem with taking too many pain pills."  Daughter states that mother lives with her, and does not drive.  Reports that unless a neighbor is bringing her mother alcohol there is no alcohol in the house.        Past Medical History:  Diagnosis Date  . Alcohol abuse    ETOH and xanax  . Anxiety    Panic attacks   . Arthritis   . Cancer ([place])    skin- basal , squamous , melonoma- right hand  . Cellulitis    right leg  . Complication of anesthesia 2009   "felt drunk for a week after"- AVM- both times- felt drunk  . Depression   . Encephalopathy   . GERD (gastroesophageal reflux disease)   . HOH (hard of hearing)   . HOH (hard of hearing)   . Hypertension    not on mediacations  . Palpitations   . UTI (urinary tract infection)    frequently    Patient Active Problem List   Diagnosis Date Noted  . Lactic acidosis 06/14/2016  . Sepsis ([place]) 06/14/2016  . Chronic pain syndrome   . Cellulitis of right lower leg 05/23/2016  . Depression 05/23/2016  . Cellulitis of right leg   . Cellulitis 02/25/2016  . Chronic back pain 02/25/2016  . Hypoxia 02/25/2016  . Lesion of liver 02/25/2016  . Acute encephalopathy 01/23/2016  . Back pain   . Urinary tract infection without hematuria 01/22/2016  . Hip fracture ([place]) 11/28/2015  . Closed left hip fracture ([employer]) 11/28/2015  . Dysuria 11/28/2015  . Fall 11/28/2015  . RLS (restless legs syndrome) 11/28/2015  . Altered mental state 11/30/2014  . Hypokalemia 03/14/2014  . Confusion 03/14/2014  . UTI (lower urinary tract infection) 03/14/2014  . Diarrhea 03/14/2014  . Effusion of right knee 02/22/2014  . Alcohol dependence ([place]) 02/22/2014  . Substance induced mood disorder ([place]) 02/22/2014  . Hepatic encephalopathy ([place])   . Altered mental status 02/21/2014  . SIRS (systemic inflammatory response syndrome) ([place]) 02/21/2014  . Complicated UTI (urinary tract infection)   . E coli bacteremia   . Sepsis due to  Escherichia coli ([place])   . Screen for STD (sexually transmitted disease)   . S/P total knee arthroplasty 01/02/2014  . Right knee pain 11/10/2013  . Essential hypertension, benign 11/10/2013  . LAFB (left anterior fascicular block) 11/10/2013  . Obesity, unspecified 11/10/2013    Past Surgical History:  Procedure Laterality Date  . BREAST SURGERY Right    2 breast biopsies  . 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 COLONOSCOPY  2011   polyps  . EYE SURGERY Bilateral    cataracts  . INTRAMEDULLARY (IM) NAIL INTERTROCHANTERIC Left 11/29/2015   Procedure: LEFT HIP   NAIL;  Surgeon: Renette Butters, MD;  Location: [facility];  Service: Orthopedics;  Laterality: Left;  . JOINT REPLACEMENT Left 09/2009   knee  . TONSILLECTOMY    . TOTAL KNEE  ARTHROPLASTY Right 01/02/2014   Procedure: TOTAL KNEE ARTHROPLASTY;  Surgeon: [name], MD;  Location: [facility];  Service: Orthopedics;  Laterality: Right;  . TUBAL LIGATION      OB History    No data available       Home Medications    Prior to Admission medications   Medication Sig Start Date End Date Taking? Authorizing Provider  acetaminophen (TYLENOL) 500 MG tablet Take 1,000 mg by mouth every 6 (six) hours as needed for mild pain, moderate pain or headache.    Yes Historical Provider, MD  aspirin EC 81 MG tablet Take 81 mg by mouth at bedtime.    Yes Historical Provider, MD  cephALEXin (KEFLEX) 500 MG capsule Take 1 capsule (500 mg total) by mouth 2 (two) times daily. Continue until seen by Dr Johnnye Sima 4/18. 06/04/16 07/04/16 Yes Campbell Riches, MD  citalopram (CELEXA) 20 MG tablet Take 1 tablet (20 mg total) by mouth at bedtime. 02/24/14  Yes [name], MD  estradiol (ESTRACE) 0.1 MG/GM vaginal cream Place 1 Applicatorful vaginally at bedtime as needed (for vaginal discomfort).   Yes Historical Provider, MD  furosemide (LASIX) 20 MG tablet Take 1 tablet (20 mg total) by mouth daily. 05/30/16  Yes Silver Huguenin Elgergawy, MD  gabapentin (NEURONTIN) 100 MG capsule Take 100-300 mg by mouth 2 (two) times daily. 200 mg in the morning & 300 mg at bedtime   Yes Historical Provider, MD  hydrOXYzine (ATARAX/VISTARIL) 25 MG tablet Take 50 mg by mouth at bedtime as needed for anxiety (and/or sleep).    Yes Historical Provider, MD  loratadine (CLARITIN) 10 MG tablet Take 10 mg by mouth daily.    Yes Historical Provider, MD  methocarbamol (ROBAXIN) 500 MG tablet Take 250-500 mg by mouth 2 (two) times daily as needed (for leg cramps/spasms.).    Yes Historical Provider, MD  ondansetron (ZOFRAN-ODT) 4 MG disintegrating tablet Take 4 mg by mouth every 6 (six) hours as needed for nausea or vomiting.    Yes Historical Provider, MD  oxyCODONE (OXY IR/ROXICODONE) 5 MG immediate release tablet Take 1 tablet (5  mg total) by mouth every 6 (six) hours as needed for severe pain. 05/29/16  Yes Albertine Patricia, MD  Polyethyl Glycol-Propyl Glycol (SYSTANE OP) Apply 1-2 drops to eye 3 (three) times daily as needed (for dry eyes).   Yes Historical Provider, MD  potassium chloride (K-DUR) 10 MEQ tablet Take 1 tablet (10 mEq total) by mouth daily. 05/29/16  Yes Albertine Patricia, MD  pramipexole (MIRAPEX) 0.125 MG tablet Take 0.375 mg by mouth 2 (two) times daily as needed (for restless leg syndrome).  Yes Historical Provider, MD  senna-docusate (SENOKOT S) 8.6-50 MG tablet Take 1 tablet by mouth 2 (two) times daily. 02/28/16  Yes Eugenie Filler, MD  traZODone (DESYREL) 100 MG tablet Take 100 mg by mouth at bedtime as needed for sleep.   Yes Historical Provider, MD  zolpidem (AMBIEN) 10 MG tablet Take 5 mg by mouth at bedtime as needed for sleep.   Yes Historical Provider, MD  acidophilus (RISAQUAD) CAPS capsule Take 2 capsules by mouth daily. Patient not taking: Reported on 06/14/2016 05/29/16   Albertine Patricia, MD  folic acid (FOLVITE) 1 MG tablet Take 1 tablet (1 mg total) by mouth daily. Patient not taking: Reported on 05/07/2016 02/29/16   Eugenie Filler, MD  ipratropium-albuterol (DUONEB) 0.5-2.5 (3) MG/3ML SOLN Take 3 mLs by nebulization every 4 (four) hours as needed. Patient not taking: Reported on 05/07/2016 02/28/16   Eugenie Filler, MD  thiamine 100 MG tablet Take 1 tablet (100 mg total) by mouth daily. Patient not taking: Reported on 05/07/2016 02/29/16   Eugenie Filler, MD  traMADol (ULTRAM) 50 MG tablet Take 1 tablet (50 mg total) by mouth every 6 (six) hours as needed for moderate pain. Patient not taking: Reported on 05/23/2016 02/28/16   Eugenie Filler, MD    Family History Family History  Problem Relation Age of Onset  . Hypertension Other   . Cancer Mother   . Cancer Father     Social History Social History  Substance Use Topics  . Smoking status: Former Smoker     Years: 20.00  . Smokeless tobacco: Never Used     Comment: quit smoking many years ago .  quit 1994  . Alcohol use No     Comment: unable to get alcohol per family not since September     Allergies   Sulfa antibiotics; Ciprofloxacin; and Motrin [ibuprofen]   Review of Systems Review of Systems  Constitutional: Positive for chills and fatigue. Negative for fever.  HENT: Negative for ear pain and sore throat.   Eyes: Negative for pain and visual disturbance.  Respiratory: Positive for shortness of breath. Negative for cough and chest tightness.   Cardiovascular: Negative for chest pain, palpitations and leg swelling.  Gastrointestinal: Positive for abdominal pain, nausea and vomiting. Negative for abdominal distention, constipation and diarrhea.  Genitourinary: Negative for dysuria and hematuria.  Musculoskeletal: Positive for arthralgias, myalgias, neck pain and neck stiffness. Negative for back pain.  Skin: Negative for color change and rash.  Neurological: Positive for weakness. Negative for dizziness, seizures and syncope.  All other systems reviewed and are negative.    Physical Exam Updated Vital Signs BP 129/86   Pulse (!) 101   Temp 99.9 F (37.7 C) (Rectal)   Resp 19   SpO2 98%   Physical Exam  Constitutional: She appears well-developed and well-nourished.  Non-toxic appearance. She does not have a sickly appearance. She appears ill. She appears distressed.  Appears anxious  HENT:  Head: Normocephalic and atraumatic. Head is without raccoon's eyes, without Battle's sign, without contusion and without laceration.  Eyes: Conjunctivae are normal. Pupils are equal, round, and reactive to light. Right eye exhibits no discharge. Left eye exhibits no discharge. No scleral icterus.  Neck: Spinous process tenderness present. No tracheal deviation present.  Cardiovascular: Regular rhythm, normal heart sounds and intact distal pulses.  Exam reveals no gallop and no friction  rub.   No murmur heard. Pulmonary/Chest: Breath sounds normal. No stridor. Tachypnea (Is able  to slow breathing when coached) noted. No respiratory distress. She has no wheezes. She exhibits no tenderness.  Abdominal: Soft. Bowel sounds are normal. She exhibits no distension. There is tenderness (Diffuse upper quadrant pain).  Musculoskeletal: She exhibits no edema.  Bruising to left elbow  Neurological: She is alert. No cranial nerve deficit or sensory deficit.  Skin: Skin is warm and dry. Ecchymosis noted. No rash noted. She is not diaphoretic.     Psychiatric: She has a normal mood and affect.  Nursing note and vitals reviewed. Patient becomes agitated when rolled on her side or moved.   ED Treatments / Results  Labs (all labs ordered are listed, but only abnormal results are displayed) Labs Reviewed  COMPREHENSIVE METABOLIC PANEL - Abnormal; Notable for the following:       Result Value   Potassium 2.7 (*)    Chloride 95 (*)    Glucose, Bld 141 (*)    Creatinine, Ser 1.08 (*)    AST 57 (*)    Alkaline Phosphatase 134 (*)    Total Bilirubin 1.6 (*)    GFR calc non Af Amer 49 (*)    GFR calc Af Amer 57 (*)    Anion gap 18 (*)    All other components within normal limits  CBC WITH DIFFERENTIAL/PLATELET - Abnormal; Notable for the following:    WBC 14.6 (*)    RBC 5.55 (*)    Hemoglobin 17.2 (*)    HCT 48.8 (*)    Neutro Abs 12.0 (*)    Monocytes Absolute 1.3 (*)    All other components within normal limits  URINALYSIS, ROUTINE W REFLEX MICROSCOPIC - Abnormal; Notable for the following:    Hgb urine dipstick MODERATE (*)    Ketones, ur 5 (*)    Leukocytes, UA TRACE (*)    Squamous Epithelial / LPF 6-30 (*)    All other components within normal limits  CK - Abnormal; Notable for the following:    Total CK 471 (*)    All other components within normal limits  URINALYSIS, ROUTINE W REFLEX MICROSCOPIC - Abnormal; Notable for the following:    Hgb urine dipstick MODERATE  (*)    Squamous Epithelial / LPF 0-5 (*)    All other components within normal limits  I-STAT CG4 LACTIC ACID, ED - Abnormal; Notable for the following:    Lactic Acid, Venous 5.40 (*)    All other components within normal limits  CULTURE, BLOOD (ROUTINE X 2)  CULTURE, BLOOD (ROUTINE X 2)  ETHANOL  RAPID URINE DRUG SCREEN, HOSP PERFORMED  MAGNESIUM  LACTIC ACID, PLASMA  LACTIC ACID, PLASMA  PROCALCITONIN  PROTIME-INR  APTT  TROPONIN I  CBC  CREATININE, SERUM  I-STAT TROPOININ, ED    EKG  EKG Interpretation  Date/Time:  Saturday June 14 2016 11:37:29 EDT Ventricular Rate:  107 PR Interval:    QRS Duration: 132 QT Interval:  388 QTC Calculation: 518 R Axis:   -68 Text Interpretation:  Sinus tachycardia Multiple premature complexes, vent & supraven RBBB and LAFB Baseline wander in lead(s) V6 No significant change was found Confirmed by CAMPOS  MD, Lennette Bihari ([id number]) on 06/14/2016 12:28:16 PM       Radiology Dg Chest 2 View  Result Date: 06/14/2016 CLINICAL DATA:  [place] last night, laid on carpeted floor all night long, complaining of pain at RIGHT knee, RIGHT thigh, and RIGHT chin, some chest pain in route to hospital, history hypertension, GERD, former smoker, ethanol abuse EXAM:  CHEST  2 VIEW COMPARISON:  02/25/2016 FINDINGS: Enlargement of cardiac silhouette. Mediastinal contours and pulmonary vascularity normal. Central peribronchial thickening without infiltrate, pleural effusion, or pneumothorax. Bones demineralized without fracture. IMPRESSION: Mild enlargement of cardiac silhouette. Bronchitic changes without infiltrate. Electronically Signed   By: Lavonia Dana M.D.   On: 06/14/2016 14:28   Dg Lumbar Spine Complete  Result Date: 06/14/2016 CLINICAL DATA:  [place] last night, laid on carpeted floor all night long, complaining of pain at RIGHT knee, RIGHT thigh, and RIGHT shin, some chest pain in route to hospital, septic, history hypertension, GERD, former smoker, ethanol abuse  EXAM: LUMBAR SPINE - COMPLETE 4+ VIEW COMPARISON:  MRI lumbar spine 01/23/2016 FINDINGS: Severe diffuse osseous demineralization limiting assessment. Five non-rib-bearing lumbar vertebra. Multilevel disc space narrowing and endplate spur formation. Scattered facet degenerative changes. Chronic anterior height loss at L1 unchanged. Minimal anterolisthesis at L4-L5 and retrolisthesis at L1-L2 unchanged. Vertebral body heights maintained. No acute fracture, additional subluxation, or bone destruction. No gross evidence of spondylolysis. SI joints preserved. Scattered pelvic phleboliths and minimal atherosclerotic calcification aorta. IMPRESSION: Multilevel degenerative disc and facet disease changes with osseous demineralization. No definite acute bony abnormalities. Electronically Signed   By: Lavonia Dana M.D.   On: 06/14/2016 14:31   Dg Pelvis 1-2 Views  Result Date: 06/14/2016 CLINICAL DATA:  [place] last night, laid on carpeted floor all night long, complaining of pain at RIGHT knee, RIGHT thigh, and RIGHT shin, some chest pain in route to hospital, septic, history hypertension, GERD, former smoker, ethanol abuse EXAM: PELVIS - 1-2 VIEW COMPARISON:  01/03/2014 FINDINGS: IM nail with compression screw at proximal LEFT femur post ORIF of an intertrochanteric fracture. Diffuse osseous demineralization. Hip and SI joint spaces preserved. No acute fracture, dislocation, or bone destruction. IMPRESSION: No acute osseous abnormalities. Osseous demineralization with prior ORIF of the proximal LEFT femur. Electronically Signed   By: Lavonia Dana M.D.   On: 06/14/2016 14:33   Ct Head Wo Contrast  Result Date: 06/14/2016 CLINICAL DATA:  Altered mental status. EXAM: CT HEAD WITHOUT CONTRAST CT CERVICAL SPINE WITHOUT CONTRAST TECHNIQUE: Multidetector CT imaging of the head and cervical spine was performed following the standard protocol without intravenous contrast. Multiplanar CT image reconstructions of the cervical spine  were also generated. COMPARISON:  Brain MR dated 12/01/2014 and head CT dated 11/30/2014. FINDINGS: CT HEAD FINDINGS Brain: Streak artifacts from metal embolization coils at the skull base. Motion artifacts simulating subdural hematomas on image number 25 of series 5, not seen on repeat imaging through that area. Diffusely enlarged ventricles and subarachnoid spaces. Patchy white matter low density in both cerebral hemispheres. Stable old right basal ganglia lacunar infarct or prominent perivascular space. No intracranial hemorrhage, mass lesion or CT evidence of acute infarction. Vascular: No hyperdense vessel or unexpected calcification. Skull: Normal. Negative for fracture or focal lesion. Sinuses/Orbits: Status post bilateral cataract removal. The included portions of the paranasal sinuses are normally pneumatized. Other: Embolization coils in the region of the left cavernous sinus. CT CERVICAL SPINE FINDINGS Alignment: Reversal of the normal lordosis in the lower cervical spine. No subluxations. Skull base and vertebrae: No acute fracture. No primary bone lesion or focal pathologic process. Soft tissues and spinal canal: No prevertebral fluid or swelling. No visible canal hematoma. Disc levels: Marked disc space narrowing at the C5-6 and C6-7 levels with mild to moderate anterior and mild posterior spur formation at those levels. Minimal anterior spur formation at the C7-T1 level. Upper chest: Small amount of right  apical parenchymal scarring and left apical pleural and parenchymal scarring. Other: Motion artifacts. The images were repeated to eliminate these. IMPRESSION: 1. No acute intracranial or cervical spine abnormality. 2. Mildly progressive diffuse cerebral atrophy and chronic small vessel white matter ischemic changes in both cerebral hemispheres. 3. Cervical spine degenerative changes. Electronically Signed   By: Claudie Revering M.D.   On: 06/14/2016 15:49   Ct Cervical Spine Wo Contrast  Result  Date: 06/14/2016 CLINICAL DATA:  Altered mental status. EXAM: CT HEAD WITHOUT CONTRAST CT CERVICAL SPINE WITHOUT CONTRAST TECHNIQUE: Multidetector CT imaging of the head and cervical spine was performed following the standard protocol without intravenous contrast. Multiplanar CT image reconstructions of the cervical spine were also generated. COMPARISON:  Brain MR dated 12/01/2014 and head CT dated 11/30/2014. FINDINGS: CT HEAD FINDINGS Brain: Streak artifacts from metal embolization coils at the skull base. Motion artifacts simulating subdural hematomas on image number 25 of series 5, not seen on repeat imaging through that area. Diffusely enlarged ventricles and subarachnoid spaces. Patchy white matter low density in both cerebral hemispheres. Stable old right basal ganglia lacunar infarct or prominent perivascular space. No intracranial hemorrhage, mass lesion or CT evidence of acute infarction. Vascular: No hyperdense vessel or unexpected calcification. Skull: Normal. Negative for fracture or focal lesion. Sinuses/Orbits: Status post bilateral cataract removal. The included portions of the paranasal sinuses are normally pneumatized. Other: Embolization coils in the region of the left cavernous sinus. CT CERVICAL SPINE FINDINGS Alignment: Reversal of the normal lordosis in the lower cervical spine. No subluxations. Skull base and vertebrae: No acute fracture. No primary bone lesion or focal pathologic process. Soft tissues and spinal canal: No prevertebral fluid or swelling. No visible canal hematoma. Disc levels: Marked disc space narrowing at the C5-6 and C6-7 levels with mild to moderate anterior and mild posterior spur formation at those levels. Minimal anterior spur formation at the C7-T1 level. Upper chest: Small amount of right apical parenchymal scarring and left apical pleural and parenchymal scarring. Other: Motion artifacts. The images were repeated to eliminate these. IMPRESSION: 1. No acute  intracranial or cervical spine abnormality. 2. Mildly progressive diffuse cerebral atrophy and chronic small vessel white matter ischemic changes in both cerebral hemispheres. 3. Cervical spine degenerative changes. Electronically Signed   By: Claudie Revering M.D.   On: 06/14/2016 15:49   Dg Femur Min 2 Views Right  Result Date: 06/14/2016 CLINICAL DATA:  [place] last night, laid on carpeted floor all night long, complaining of pain at RIGHT knee, RIGHT thigh, and RIGHT shin, some chest pain in route to hospital, history hypertension, GERD, former smoker, ethanol abuse EXAM: RIGHT FEMUR 2 VIEWS COMPARISON:  None FINDINGS: Osseous demineralization. Hip joint space preserved. RIGHT knee prosthetic components noted without surrounding lucency. No acute fracture, dislocation, or bone destruction. IMPRESSION: No acute osseous abnormalities. Electronically Signed   By: Lavonia Dana M.D.   On: 06/14/2016 14:36    Procedures Procedures   CRITICAL CARE Performed by: Wyn Quaker Total critical care time: 31 minutes Critical care time was exclusive of separately billable procedures and treating other patients. Critical care was necessary to treat or prevent imminent or life-threatening deterioration. Critical care was time spent personally by me on the following activities: development of treatment plan with patient and/or surrogate as well as nursing, discussions with consultants, evaluation of patient's response to treatment, examination of patient, obtaining history from patient or surrogate, ordering and performing treatments and interventions, ordering and review of laboratory studies, ordering and  review of radiographic studies, pulse oximetry and re-evaluation of patient's condition.  Sepsis with lactate over 4, confusion  Medications Ordered in ED Medications  vancomycin (VANCOCIN) IVPB 750 mg/150 ml premix (not administered)  piperacillin-tazobactam (ZOSYN) IVPB 3.375 g (not administered)    potassium chloride 30 mEq in sodium chloride 0.9 % 265 mL (KCL MULTIRUN) IVPB (0 mEq Intravenous Hold 06/14/16 1710)  white petrolatum (VASELINE) gel ( Topical Given 06/14/16 1640)  sodium chloride 0.9 % 1,000 mL with potassium chloride 60 mEq infusion ( Intravenous Transfusing/Transfer 06/14/16 1710)  magnesium sulfate IVPB 2 g 50 mL (not administered)  enoxaparin (LOVENOX) injection 40 mg (not administered)  acetaminophen (TYLENOL) tablet 650 mg (not administered)    Or  acetaminophen (TYLENOL) suppository 650 mg (not administered)  ondansetron (ZOFRAN) tablet 4 mg (not administered)    Or  ondansetron (ZOFRAN) injection 4 mg (not administered)  levalbuterol (XOPENEX) nebulizer solution 0.63 mg (not administered)  fentaNYL (SUBLIMAZE) injection 50 mcg (50 mcg Intravenous Given 06/14/16 1231)  sodium chloride 0.9 % bolus 1,000 mL (0 mLs Intravenous Stopped 06/14/16 1312)  acetaminophen (TYLENOL) tablet 1,000 mg (1,000 mg Oral Given 06/14/16 1233)  piperacillin-tazobactam (ZOSYN) IVPB 3.375 g (0 g Intravenous Stopped 06/14/16 1441)  vancomycin (VANCOCIN) IVPB 1000 mg/200 mL premix (0 mg Intravenous Stopped 06/14/16 1441)  sodium chloride 0.9 % bolus 2,000 mL (0 mLs Intravenous Stopped 06/14/16 1702)  LORazepam (ATIVAN) injection 1 mg (1 mg Intravenous Given 06/14/16 1321)  vancomycin (VANCOCIN) IVPB 1000 mg/200 mL premix (0 mg Intravenous Stopped 06/14/16 1544)    1315: informed by RN that patient was anxious, frantic, unable to complete imaging, screaming about being dropped.  1mg  Ativan ordered.  Attempted to call pts daughter after getting patients permission.  Initially got no answer.  Pt daughter, Baldo Ash, called back and spoke with Baldo Ash to update her on her mothers ED course.  She reports her mother has chronic lateral thigh pain that sounds consistent with the pain she is experiencing today.  X-rays will be completed.   1420: Patient was reassessed.  Good breath sounds bilaterally.  No  new complaints, resting comfortably.    Initial Impression / Assessment and Plan / ED Course  I have reviewed the triage vital signs and the nursing notes.  Pertinent labs & imaging results that were available during my care of the patient were reviewed by me and considered in my medical decision making (see chart for details).    JOY REIGER presents today after a unwitnessed fall at home that she was unable to get up from and spent the night on the floor.  Initially patient was tachycardic, tachypneic, with a rectal temp of 99.9.  Additionally she is only oriented to person and place and is unable to recall the day, month, year or season.  Her daughter reports that this is abnormal for her as she is normally fully oriented.    Due to her fall multiple x-rays and CT scans were performed.  X-rays of right femur, pelvis, L-spine did not show any acute changes. Chest x-ray showed mild enlargement of the cardiac silhouette with no pneumonia present.  CT cervical spine revealed no acute  Abnormalities.  CT head showed no evidence of intracranial hemorrhage, skull fracture, or other acute process.  Lab work was significant for i-STAT lactic acid of 5.40. Blood cultures were obtained and white count was 14.6 with elevated neutrophils, concerning for infection.  As patient spent the night on the floor CK was obtained which was  elevated but not to the point of being consistent with rhabdomyolysis.  Initial UA was contaminated and repeat UA from in and out cath is pending. Ethanol level was obtained which was found to be negative.  Potassium was 2.7 which was repleted with IV potassium.    Given sepsis and concern for infection she was empirically treated with vancomycin and Zosyn.  She was given total 30 ML/kg fluid bolus.  Troponin and EKGs were obtained which were negative for acute changes.  Her pain was controlled with fentanyl and her nausea was controlled with Zofran.  During her stay she was noted  to be anxious and required 1 MG Ativan to be able to complete scans safely.  Hospitalists were called for admission due to suspected sepsis without hypotension.  She will be [facility] to [facility].   Final Clinical Impressions(s) / ED Diagnoses   Final diagnoses:  Fall  Confusion  Hypokalemia  Elevated lactic acid level  Sepsis, due to unspecified organism [facility])  Elevated CK    New Prescriptions New Prescriptions   No medications on file     Lorin Glass, Hershal Coria 06/14/16 [place], MD 06/16/16 [phone]

## 2016-06-15 DIAGNOSIS — L0390 Cellulitis, unspecified: Secondary | ICD-10-CM

## 2016-06-15 DIAGNOSIS — D72829 Elevated white blood cell count, unspecified: Secondary | ICD-10-CM

## 2016-06-15 DIAGNOSIS — L03115 Cellulitis of right lower limb: Secondary | ICD-10-CM

## 2016-06-15 LAB — COMPREHENSIVE METABOLIC PANEL
ALK PHOS: 98 U/L (ref 38–126)
ALT: 26 U/L (ref 14–54)
AST: 44 U/L — AB (ref 15–41)
Albumin: 2.7 g/dL — ABNORMAL LOW (ref 3.5–5.0)
Anion gap: 8 (ref 5–15)
BUN: 6 mg/dL (ref 6–20)
CALCIUM: 7.7 mg/dL — AB (ref 8.9–10.3)
CHLORIDE: 105 mmol/L (ref 101–111)
CO2: 28 mmol/L (ref 22–32)
CREATININE: 0.83 mg/dL (ref 0.44–1.00)
GFR calc Af Amer: >60 mL/min (ref >60)
Glucose, Bld: 108 mg/dL — ABNORMAL HIGH (ref 65–99)
Potassium: 2.5 mmol/L — CL (ref 3.5–5.1)
Sodium: 141 mmol/L (ref 135–145)
Total Bilirubin: 1.8 mg/dL — ABNORMAL HIGH (ref 0.3–1.2)
Total Protein: 5.2 g/dL — ABNORMAL LOW (ref 6.5–8.1)

## 2016-06-15 LAB — CBC
HCT: 39.4 % (ref 36.0–46.0)
Hemoglobin: 13.4 g/dL (ref 12.0–15.0)
MCH: 30.5 pg (ref 26.0–34.0)
MCHC: 34 g/dL (ref 30.0–36.0)
MCV: 89.5 fL (ref 78.0–100.0)
PLATELETS: 166 10*3/uL (ref 150–400)
RBC: 4.4 MIL/uL (ref 3.87–5.11)
RDW: 13.4 % (ref 11.5–15.5)
WBC: 9.7 10*3/uL (ref 4.0–10.5)

## 2016-06-15 MED ORDER — MAGNESIUM SULFATE 2 GM/50ML IV SOLN
2.0000 g | Freq: Once | INTRAVENOUS | Status: AC
  Administered 2016-06-15: 2 g via INTRAVENOUS
  Filled 2016-06-15: qty 50

## 2016-06-15 MED ORDER — POTASSIUM CHLORIDE CRYS ER 20 MEQ PO TBCR
40.0000 meq | EXTENDED_RELEASE_TABLET | Freq: Once | ORAL | Status: AC
  Administered 2016-06-15: 40 meq via ORAL
  Filled 2016-06-15: qty 2

## 2016-06-15 NOTE — Progress Notes (Addendum)
Patient ID: Denise Kelly, female   DOB: [birth year], 76 y.o.   MRN: [id number]  PROGRESS NOTE    Denise Kelly  SNK:[id number] DOB: [birth year] DOA: 06/14/2016  PCP: Jonathon Bellows, MD   Brief Narrative:  76 y.o.femalewith medical history significant for hypertension, GERD, depression, hearing loss, skin cancer, former alcohol abuser, chronic back pain who presented to the ED status post fall. She was found by her neighbor. Apparently pt fell sometime the night prior to the admission and could not get up and fell asleep on the floor. Pt does not recall details of the events that led to the fall. Patient was recently hospitalized and discharged between 3/9-3/15, for recurrent cellulitis. Infectious disease was consulted. Patient underwent MRI to rule out deeper infection and that was negative. She was thought to have a degree of venous insufficiency with known history of total knee replacement, initially placed on vancomycin and Rocephin and subsequently treated with Zosyn and then Keflex for a total of 3-4 weeks. Pt was stable on admission. BP was 129/86, HR 1010, T max 99.6F, RR 19, normal O2 saturation. Blood owrk showed WBC count of 14.6, potassium 2.7 (supplemented), lactic acid 5.4. She was started on vanco and zosyn for sepsis thought to be from lower extremity cellulitis.   Assessment & Plan:   Active Problems: Sepsis secondary to right lower extremity cellulitis / Lactic acidosis / Leukocytosis - Sepsis criteria met on admission with low grade fever, tachycardia, tachypnea, leukocytosis and lactic acidosis - Source of infection is lower extremity cellulitis - LE cellulitis on Right leg is toxic appearing, tender and warm to palpation - She essentially failed outpt treatment as she was already on abx prior to admission - Leukocytosis and lactic acid WNL this am  Fall - No acute intracranial findings on CT head - No acute fractures on imaging studies - PT eval pending    Hypokalemia/ Hypomagnesemia  - Due to sepsis  - Supplemented - Follow up BMP in am  Acute kidney injury - Due to sepsis  - Cr now NWL  Mild troponin elevation - Due to sepsis - Trop 0.03 - No chest pain   Depression - Continue Celexa    DVT prophylaxis: Lovenox subQ Code Status: full code  Family Communication: no family at the bedside this am Disposition Plan: home once resp status stable    Consultants:   PT   Procedures:   None   Antimicrobials:   Vanco and zosyn 3/31 -->   Subjective: No overnight events.   Objective: Vitals:   06/14/16 1909 06/14/16 2307 06/15/16 0511 06/15/16 0530  BP:  133/69 122/68   Pulse:  94 81   Resp:  18 18   Temp:  98.4 F (36.9 C) 98.2 F (36.8 C)   TempSrc:  Oral Oral   SpO2:  100% 98% 96%  Weight: 93 kg (205 lb 1.6 oz)     Height: _0  (1.727 m)       Intake/Output Summary (Last 24 hours) at 06/15/16 1123 Last data filed at 06/15/16 0816  Gross per 24 hour  Intake          6233.33 ml  Output                0 ml  Net          6233.33 ml   Filed Weights   06/14/16 1909  Weight: 93 kg (205 lb 1.6 oz)    Examination:  General exam: Appears  calm and comfortable  Respiratory system: no wheezing, no rhonchi  Cardiovascular system: S1 & S2 heard, rate controlled  Gastrointestinal system: Abdomen is nondistended, soft and nontender. No organomegaly or masses felt. Normal bowel sounds heard. Central nervous system: No focal neurological deficits. Extremities: palpable pulses bilaterally  Skin: RLE redness from ankle up to knee, warm and tender to palpation Psychiatry: Judgement and insight appear normal. Mood & affect appropriate.   Data Reviewed:   CBC:  Recent Labs Lab 06/14/16 1159 06/14/16 1739 06/15/16 0344  WBC 14.6* 12.2* 9.7  NEUTROABS 12.0*  --   --   HGB 17.2* 14.0 13.4  HCT 48.8* 41.8 39.4  MCV 87.9 88.4 89.5  PLT 227 173 417   Basic Metabolic Panel:  Recent Labs Lab 06/14/16 1159  06/14/16 1739 06/15/16 0344  NA 141  --  141  K 2.7*  --  2.5*  CL 95*  --  105  CO2 28  --  28  GLUCOSE 141*  --  108*  BUN 8  --  6  CREATININE 1.08* 0.83 0.83  CALCIUM 9.2  --  7.7*  MG  --  1.5*  --    GFR: Estimated Creatinine Clearance: 70.9 mL/min (by C-G formula based on SCr of 0.83 mg/dL). Liver Function Tests:  Recent Labs Lab 06/14/16 1159 06/15/16 0344  AST 57* 44*  ALT 24 26  ALKPHOS 134* 98  BILITOT 1.6* 1.8*  PROT 7.1 5.2*  ALBUMIN 3.8 2.7*   No results for input(s): LIPASE, AMYLASE in the last 168 hours. No results for input(s): AMMONIA in the last 168 hours. Coagulation Profile:  Recent Labs Lab 06/14/16 1739  INR 1.17   Cardiac Enzymes:  Recent Labs Lab 06/14/16 1159 06/14/16 1739  CKTOTAL 471*  --   TROPONINI  --  0.03*   BNP (last 3 results) No results for input(s): PROBNP in the last 8760 hours. HbA1C: No results for input(s): HGBA1C in the last 72 hours. CBG: No results for input(s): GLUCAP in the last 168 hours. Lipid Profile: No results for input(s): CHOL, HDL, LDLCALC, TRIG, CHOLHDL, LDLDIRECT in the last 72 hours. Thyroid Function Tests: No results for input(s): TSH, T4TOTAL, FREET4, T3FREE, THYROIDAB in the last 72 hours. Anemia Panel: No results for input(s): VITAMINB12, FOLATE, FERRITIN, TIBC, IRON, RETICCTPCT in the last 72 hours. Urine analysis:    Component Value Date/Time   COLORURINE YELLOW 06/14/2016 1628   APPEARANCEUR CLEAR 06/14/2016 1628   LABSPEC 1.010 06/14/2016 1628   PHURINE 6.0 06/14/2016 1628   GLUCOSEU NEGATIVE 06/14/2016 1628   HGBUR MODERATE (A) 06/14/2016 1628   BILIRUBINUR NEGATIVE 06/14/2016 1628   KETONESUR NEGATIVE 06/14/2016 1628   PROTEINUR NEGATIVE 06/14/2016 1628   UROBILINOGEN 1.0 11/30/2014 2105   NITRITE NEGATIVE 06/14/2016 1628   LEUKOCYTESUR NEGATIVE 06/14/2016 1628   Sepsis Labs: _0 (procalcitonin:4,lacticidven:4)   )No results found for this or any previous visit (from  the past 240 hour(s)).    Radiology Studies: Dg Chest 2 View Result Date: 06/14/2016 Mild enlargement of cardiac silhouette. Bronchitic changes without infiltrate.   Dg Lumbar Spine Complete Result Date: 06/14/2016 Multilevel degenerative disc and facet disease changes with osseous demineralization. No definite acute bony abnormalities.   Dg Pelvis 1-2 Views Result Date: 06/14/2016 No acute osseous abnormalities. Osseous demineralization with prior ORIF of the proximal LEFT femur.   Ct Head Wo Contrast Result Date: 06/14/2016 1. No acute intracranial or cervical spine abnormality. 2. Mildly progressive diffuse cerebral atrophy and chronic small vessel white  matter ischemic changes in both cerebral hemispheres. 3. Cervical spine degenerative changes.  Ct Cervical Spine Wo Contrast Result Date: 06/14/2016 1. No acute intracranial or cervical spine abnormality. 2. Mildly progressive diffuse cerebral atrophy and chronic small vessel white matter ischemic changes in both cerebral hemispheres. 3. Cervical spine degenerative changes.   Dg Femur Min 2 Views Right Result Date: 06/14/2016 No acute osseous abnormalities.   Scheduled Meds: . citalopram  20 mg Oral QHS  . Enoxaparin injection  40 mg Subcutaneous Q24H  . folic acid  1 mg Oral Daily  . gabapentin  200 mg Oral Daily  . gabapentin  300 mg Oral QHS  . magnesium sulfate 1 - 4 g bolus IVPB  2 g Intravenous Once  . piperacillin-tazobactam (ZOSYN)  IV  3.375 g Intravenous Q8H  . thiamine  100 mg Oral Daily  . vancomycin  750 mg Intravenous Q12H   Continuous Infusions: . 0.9 % sodium chloride with kcl 50 mL/hr at 06/15/16 0816     LOS: 1 day    Time spent: 25 minutes  Greater than 50% of the time spent on counseling and coordinating the care.   Leisa Lenz, MD [facility] Pager [phone]  If 7PM-7AM, please contact night-coverage [email or web address] Password [facility] 06/15/2016, 11:23 AM

## 2016-06-15 NOTE — Evaluation (Signed)
Physical Therapy Evaluation Patient Details Name: Denise Kelly MRN: [id number] DOB: 12-20-1940 Today's Date: 06/15/2016   History of Present Illness  Patient is a 76 yo female admitted 06/14/16 after fall during night.  Patient could not get up, so slept on floor.  Patient wtih lactic acidosis, sepsis RLE cellulitis, hypokalemia.    PMH:  HTN, depression, hearing loss, ETOH use (former), chronic back pain, recent hospitalization 05/23/16-05/29/16 with recurrent cellulitis  Clinical Impression  Patient presents with problems listed below.  Patient will benefit from acute PT to maximize functional independence prior to return home.  Patient will need to function at Mod I level to return home alone safely.  Recommend f/u HHPT for continued therapy at d/c.    If patient unable to reach Mod I level, may need to consider ST-SNF.    Follow Up Recommendations Home health PT;Supervision - Intermittent    Equipment Recommendations  None recommended by PT    Recommendations for Other Services OT consult     Precautions / Restrictions Precautions Precautions: Fall Restrictions Weight Bearing Restrictions: No      Mobility  Bed Mobility Overal bed mobility: Needs Assistance Bed Mobility: Rolling;Sidelying to Sit;Sit to Sidelying Rolling: Supervision Sidelying to sit: Min guard     Sit to sidelying: Min assist General bed mobility comments: Use of rail to roll to both sides.  Assist to move to sitting for safety.  Assist to bring LE's onto bed to return to sidelying.  Transfers Overall transfer level: Needs assistance Equipment used: Rolling walker (2 wheeled) Transfers: Sit to/from Stand Sit to Stand: Min assist         General transfer comment: Verbal cues for hand placement.  Assist to steady during transfer to standing.  Patient reports increased pain in Lt hip and Rt LE in stance.  Ambulation/Gait Ambulation/Gait assistance: Min assist Ambulation Distance (Feet): 0  Feet Assistive device: Rolling walker (2 wheeled)       General Gait Details: Patient attempted to take a step with RW and assist.  Patient unable to stand on one leg to step with the other due to pain.  Returned to sitting.  Stairs            Wheelchair Mobility    Modified Rankin (Stroke Patients Only)       Balance Overall balance assessment: Needs assistance Sitting-balance support: No upper extremity supported;Feet supported Sitting balance-Leahy Scale: Good     Standing balance support: Bilateral upper extremity supported Standing balance-Leahy Scale: Poor                               Pertinent Vitals/Pain Pain Assessment: 0-10 Pain Score: 6  Pain Location: Lt hip/side (fall); Rt lower leg (cellulitis) Pain Descriptors / Indicators: Aching;Grimacing;Guarding;Sharp;Sore;Tender Pain Intervention(s): Limited activity within patient's tolerance;Monitored during session;Repositioned    Home Living Family/patient expects to be discharged to:: Private residence Living Arrangements: Alone Available Help at Discharge: Family;Available PRN/intermittently;Neighbor (Daughter in same complex; neighbors take care of cats daily) Type of Home: Apartment Home Access: Level entry     Home Layout: One level Home Equipment: Walker - 2 wheels;Cane - single point;Shower seat;Toilet riser      Prior Function Level of Independence: Independent with assistive device(s);Needs assistance   Gait / Transfers Assistance Needed: Using RW for ambulation short distances  ADL's / Homemaking Assistance Needed: Patient receiving [facility] services since September 2017.  Not driving since September 2017.  Hand Dominance   Dominant Hand: Right    Extremity/Trunk Assessment   Upper Extremity Assessment Upper Extremity Assessment: Overall WFL for tasks assessed    Lower Extremity Assessment Lower Extremity Assessment: Generalized weakness;LLE deficits/detail;RLE  deficits/detail RLE Deficits / Details: noted cellulitis lower leg LLE Deficits / Details: Lt hip pain impacting mobility       Communication   Communication: HOH  Cognition Arousal/Alertness: Awake/alert Behavior During Therapy: WFL for tasks assessed/performed;Agitated (Frustrated initially with number of people in room.) Overall Cognitive Status: Within Functional Limits for tasks assessed                                        General Comments      Exercises     Assessment/Plan    PT Assessment Patient needs continued PT services  PT Problem List Decreased strength;Decreased activity tolerance;Decreased balance;Decreased mobility;Decreased knowledge of use of DME;Obesity;Pain       PT Treatment Interventions DME instruction;Gait training;Functional mobility training;Therapeutic activities;Patient/family education    PT Goals (Current goals can be found in the Care Plan section)  Acute Rehab PT Goals Patient Stated Goal: to go home PT Goal Formulation: With patient Time For Goal Achievement: 06/22/16 Potential to Achieve Goals: Good    Frequency Min 3X/week   Barriers to discharge Decreased caregiver support Does not have 24 hour assist.    Co-evaluation               End of Session Equipment Utilized During Treatment: Gait belt Activity Tolerance: Patient limited by pain Patient left: in bed;with call bell/phone within reach;with bed alarm set   PT Visit Diagnosis: Unsteadiness on feet (R26.81);Difficulty in walking, not elsewhere classified (R26.2);Muscle weakness (generalized) (M62.81);Pain Pain - Right/Left:  (Bilateral) Pain - part of body: Leg    Time: 6063-0160 PT Time Calculation (min) (ACUTE ONLY): 32 min   Charges:   PT Evaluation $PT Eval Moderate Complexity: 1 Procedure PT Treatments $Therapeutic Activity: 8-22 mins   PT G Codes:        Carita Pian. Sanjuana Kava, [facility] Acute Rehab Services Pager [place] 06/15/2016, 7:11 PM

## 2016-06-15 NOTE — Progress Notes (Signed)
CRITICAL VALUE ALERT  Critical value received:  Potassium 2.5  Date of notification: 06/15/16  Time of notification:  9977  Critical value read back: Yes  Nurse who received alert:  Chyrl Civatte RN   MD notified (1st page):  T. Opyd  Time of first page:  0457  MD notified (2nd page):  Time of second page:  Responding MD:  T. Opyd 06/15/16 @ 4142  Time MD responded:  3953  New orders obtained for 40 MEQ of potassium PO and 2 grams magnesium IV. Will administer meds and continue to monitor. Patient is resting in bed with no s/s of distress noted. Respirations even and unlabored. IV fluids Normal Saline with 60 MEQ of potassium infusing without difficulty. No redness or swelling noted to IV site in right hand or left AC IV. Will continue to monitor and treat per MD orders.

## 2016-06-16 DIAGNOSIS — E872 Acidosis: Secondary | ICD-10-CM

## 2016-06-16 LAB — BASIC METABOLIC PANEL
ANION GAP: 9 (ref 5–15)
BUN: 5 mg/dL — ABNORMAL LOW (ref 6–20)
CHLORIDE: 107 mmol/L (ref 101–111)
CO2: 26 mmol/L (ref 22–32)
Calcium: 7.8 mg/dL — ABNORMAL LOW (ref 8.9–10.3)
Creatinine, Ser: 0.75 mg/dL (ref 0.44–1.00)
GFR calc non Af Amer: >60 mL/min (ref >60)
Glucose, Bld: 118 mg/dL — ABNORMAL HIGH (ref 65–99)
POTASSIUM: 2.8 mmol/L — AB (ref 3.5–5.1)
Sodium: 142 mmol/L (ref 135–145)

## 2016-06-16 LAB — CBC
HEMATOCRIT: 37.6 % (ref 36.0–46.0)
HEMOGLOBIN: 12.5 g/dL (ref 12.0–15.0)
MCH: 30.3 pg (ref 26.0–34.0)
MCHC: 33.2 g/dL (ref 30.0–36.0)
MCV: 91 fL (ref 78.0–100.0)
Platelets: 148 10*3/uL — ABNORMAL LOW (ref 150–400)
RBC: 4.13 MIL/uL (ref 3.87–5.11)
RDW: 13.7 % (ref 11.5–15.5)
WBC: 8.1 10*3/uL (ref 4.0–10.5)

## 2016-06-16 MED ORDER — ZOLPIDEM TARTRATE 5 MG PO TABS
5.0000 mg | ORAL_TABLET | Freq: Every evening | ORAL | Status: DC | PRN
  Administered 2016-06-16: 5 mg via ORAL
  Filled 2016-06-16: qty 1

## 2016-06-16 MED ORDER — POTASSIUM CHLORIDE CRYS ER 20 MEQ PO TBCR
40.0000 meq | EXTENDED_RELEASE_TABLET | Freq: Once | ORAL | Status: AC
  Administered 2016-06-16: 40 meq via ORAL
  Filled 2016-06-16: qty 2

## 2016-06-16 NOTE — Progress Notes (Signed)
Patient ID: Denise Kelly, female   DOB: 01/27/1941, 76 y.o.   MRN: [id number]  PROGRESS NOTE    Denise Kelly  GYB:[id number] DOB: 1940-10-05 DOA: 06/14/2016  PCP: Jonathon Bellows, MD   Brief Narrative:  76 y.o.femalewith medical history significant for hypertension, GERD, depression, hearing loss, skin cancer, former alcohol abuser, chronic back pain who presented to the ED status post fall. She was found by her neighbor. Apparently pt fell sometime the night prior to the admission and could not get up and fell asleep on the floor. Pt does not recall details of the events that led to the fall. Patient was recently hospitalized and discharged between 3/9-3/15, for recurrent cellulitis. Infectious disease was consulted. Patient underwent MRI to rule out deeper infection and that was negative. She was thought to have a degree of venous insufficiency with known history of total knee replacement, initially placed on vancomycin and Rocephin and subsequently treated with Zosyn and then Keflex for a total of 3-4 weeks. Pt was stable on admission. BP was 129/86, HR 1010, T max 99.75F, RR 19, normal O2 saturation. Blood owrk showed WBC count of 14.6, potassium 2.7 (supplemented), lactic acid 5.4. She was started on vanco and zosyn for sepsis thought to be from lower extremity cellulitis.   Assessment & Plan:   Active Problems: Sepsis secondary to right lower extremity cellulitis / Lactic acidosis / Leukocytosis - Sepsis criteria met on admission with low grade fever, tachycardia, tachypnea, leukocytosis and lactic acidosis - Source of infection is lower extremity cellulitis - Cellulitis slowly improving - Continue current abx through today and switch to PO in am  - LE cellulitis on Right leg is toxic appearing, tender and warm to palpation - Leukocytosis and lactic acid WNL   Fall - No acute intracranial findings on CT head - No acute fractures on imaging studies - PT eval - HH, orders placed    Hypokalemia/ Hypomagnesemia  - Due to sepsis  - Continue to supplement  - Follow up BMP and magnesium level in am  Acute kidney injury - Due to sepsis  - Cr now NWL  Mild troponin elevation - Due to sepsis - Trop 0.03 - No chest pain   Depression - Continue Celexa    DVT prophylaxis: Lovenox subQ Code Status: full code  Family Communication: no family at the bedside this am; spoke with her daughter over the phone this am Disposition Plan: home once resp status stable    Consultants:   PT   Procedures:   None   Antimicrobials:   Vanco and zosyn 3/31 -->   Subjective: No overnight events.   Objective: Vitals:   06/15/16 0530 06/15/16 1450 06/15/16 2120 06/16/16 0513  BP:  115/65 132/70 128/66  Pulse:  88 80 61  Resp:  '20 20 18  ' Temp:  98.6 F (37 C) 98.1 F (36.7 C) 98.8 F (37.1 C)  TempSrc:    Oral  SpO2: 96% 96% 96% 96%  Weight:      Height:        Intake/Output Summary (Last 24 hours) at 06/16/16 1137 Last data filed at 06/16/16 1000  Gross per 24 hour  Intake          1686.67 ml  Output              101 ml  Net          1585.67 ml   Filed Weights   06/14/16 1909  Weight: 93 kg (  205 lb 1.6 oz)    Examination:  General exam: Appears calm and comfortable, no distress  Respiratory system: bilateral air entry, no wheezing  Cardiovascular system: S1 & S2 heard, rate controlled  Gastrointestinal system: (+) BS, non tender abdomen  Central nervous system: Nonofocal  Extremities: palpable pulses bilaterally, no swelling  Skin: RLE redness from ankle up to knee, looks better this am but still redness apparent although now to mid shin Psychiatry: Normal mood and behavior .   Data Reviewed:   CBC:  Recent Labs Lab 06/14/16 1159 06/14/16 1739 06/15/16 0344 06/16/16 0401  WBC 14.6* 12.2* 9.7 8.1  NEUTROABS 12.0*  --   --   --   HGB 17.2* 14.0 13.4 12.5  HCT 48.8* 41.8 39.4 37.6  MCV 87.9 88.4 89.5 91.0  PLT 227 173 166 148*    Basic Metabolic Panel:  Recent Labs Lab 06/14/16 1159 06/14/16 1739 06/15/16 0344 06/16/16 0401  NA 141  --  141 142  K 2.7*  --  2.5* 2.8*  CL 95*  --  105 107  CO2 28  --  28 26  GLUCOSE 141*  --  108* 118*  BUN 8  --  6 5*  CREATININE 1.08* 0.83 0.83 0.75  CALCIUM 9.2  --  7.7* 7.8*  MG  --  1.5*  --   --    GFR: Estimated Creatinine Clearance: 73.5 mL/min (by C-G formula based on SCr of 0.75 mg/dL). Liver Function Tests:  Recent Labs Lab 06/14/16 1159 06/15/16 0344  AST 57* 44*  ALT 24 26  ALKPHOS 134* 98  BILITOT 1.6* 1.8*  PROT 7.1 5.2*  ALBUMIN 3.8 2.7*   No results for input(s): LIPASE, AMYLASE in the last 168 hours. No results for input(s): AMMONIA in the last 168 hours. Coagulation Profile:  Recent Labs Lab 06/14/16 1739  INR 1.17   Cardiac Enzymes:  Recent Labs Lab 06/14/16 1159 06/14/16 1739  CKTOTAL 471*  --   TROPONINI  --  0.03*   BNP (last 3 results) No results for input(s): PROBNP in the last 8760 hours. HbA1C: No results for input(s): HGBA1C in the last 72 hours. CBG: No results for input(s): GLUCAP in the last 168 hours. Lipid Profile: No results for input(s): CHOL, HDL, LDLCALC, TRIG, CHOLHDL, LDLDIRECT in the last 72 hours. Thyroid Function Tests: No results for input(s): TSH, T4TOTAL, FREET4, T3FREE, THYROIDAB in the last 72 hours. Anemia Panel: No results for input(s): VITAMINB12, FOLATE, FERRITIN, TIBC, IRON, RETICCTPCT in the last 72 hours. Urine analysis:    Component Value Date/Time   COLORURINE YELLOW 06/14/2016 1628   APPEARANCEUR CLEAR 06/14/2016 1628   LABSPEC 1.010 06/14/2016 1628   PHURINE 6.0 06/14/2016 1628   GLUCOSEU NEGATIVE 06/14/2016 1628   HGBUR MODERATE (A) 06/14/2016 1628   BILIRUBINUR NEGATIVE 06/14/2016 1628   KETONESUR NEGATIVE 06/14/2016 1628   PROTEINUR NEGATIVE 06/14/2016 1628   UROBILINOGEN 1.0 11/30/2014 2105   NITRITE NEGATIVE 06/14/2016 1628   LEUKOCYTESUR NEGATIVE 06/14/2016 1628     Sepsis Labs: '@[email or web address]' (procalcitonin:4,lacticidven:4)  Blood Culture (routine x 2)     Status: None (Preliminary result)   Collection Time: 06/14/16 12:20 PM  Result Value Ref Range Status   Specimen Description BLOOD LEFT HAND  Final   Special Requests BOTTLES DRAWN AEROBIC AND ANAEROBIC 5CC  Final   Culture NO GROWTH < 24 HOURS  Final   Report Status PENDING  Incomplete  Blood Culture (routine x 2)     Status: None (Preliminary  result)   Collection Time: 06/14/16 12:30 PM  Result Value Ref Range Status   Specimen Description BLOOD RIGHT ANTECUBITAL  Final   Special Requests BOTTLES DRAWN AEROBIC AND ANAEROBIC 10CC  Final   Culture NO GROWTH < 24 HOURS  Final   Report Status PENDING  Incomplete     Radiology Studies: Dg Chest 2 View  06/14/2016 Mild enlargement of cardiac silhouette. Bronchitic changes without infiltrate.   Dg Lumbar Spine Complete  06/14/2016 Multilevel degenerative disc and facet disease changes with osseous demineralization. No definite acute bony abnormalities.   Dg Pelvis 1-2 Views  06/14/2016 No acute osseous abnormalities. Osseous demineralization with prior ORIF of the proximal LEFT femur.   Ct Head Wo Contrast  06/14/2016 1. No acute intracranial or cervical spine abnormality. 2. Mildly progressive diffuse cerebral atrophy and chronic small vessel white matter ischemic changes in both cerebral hemispheres. 3. Cervical spine degenerative changes.  Ct Cervical Spine Wo Contrast  06/14/2016 1. No acute intracranial or cervical spine abnormality. 2. Mildly progressive diffuse cerebral atrophy and chronic small vessel white matter ischemic changes in both cerebral hemispheres. 3. Cervical spine degenerative changes.   Dg Femur Min 2 Views Right  06/14/2016 No acute osseous abnormalities.   Scheduled Meds: . citalopram  20 mg Oral QHS  . Enoxaparin injection  40 mg Subcutaneous Q24H  . folic acid  1 mg Oral Daily  . gabapentin  200 mg Oral Daily  .  gabapentin  300 mg Oral QHS  . magnesium sulfate 1 - 4 g bolus IVPB  2 g Intravenous Once  . piperacillin-tazobactam (ZOSYN)  IV  3.375 g Intravenous Q8H  . thiamine  100 mg Oral Daily  . vancomycin  750 mg Intravenous Q12H   Continuous Infusions: . 0.9 % sodium chloride with kcl 50 mL/hr at 06/15/16 0816     LOS: 2 days    Time spent: 25 minutes  Greater than 50% of the time spent on counseling and coordinating the care.   Leisa Lenz, MD [facility] Pager [phone]  If 7PM-7AM, please contact night-coverage [email or web address] Password [facility] 06/16/2016, 11:37 AM

## 2016-06-16 NOTE — Progress Notes (Signed)
Patient has been having frequent stools and frequent urination. She denies any burning with urination. She is refusing any further antibiotics, as she is attributing her diarrhea to these medications. Paged Dr. Charlies Silvers.

## 2016-06-16 NOTE — Consult Note (Signed)
   [facility] Inpatient Consult   06/16/2016  Denise Kelly 1940-07-26 [id number]  Evaluated for re-admission and 4th admission in the past 6 months.  Active patient with [facility] with the Medicare ACO.  Chart review reveals the patient is a 76 y.o.femalewith medical history significant for hypertension, GERD, depression, hearing loss, skin cancer, former alcohol abuser, chronic back pain who presentedto the ED status post fall. She was found by her neighbor. Apparently pt fell sometime the night prior to the admission and could not get up and fell asleep on the floor. Pt does not recall details of the events that led to the fall. Patient was recently hospitalized and discharged between 3/9-3/15, for recurrent cellulitis. Infectious disease was consulted. Patient underwent MRI to rule out deeper infection and that was negative. She was thought to have a degree of venous insufficiency with known history of total knee replacement, initially placed on vancomycin and Rocephin and subsequently treated with Zosyn and then Keflex for a total of 3-4 weeks.  Met with the patient at the bedside.  Patient states she agrees to ongoing follow up with [facility].  Patient is concerned for need for rehab but does not want to go to a SNF.  She wanted to be considered for a short inpatient rehab.  Explained that this writer will let the inpatient RNCM know of her concerns and explained that the PT and OT will have to evaluate their recommendations.  Updated inpatient RNCM, Levada Dy of patient's rehab concerns.  Patient has a history of [facility] in the past for home health.  She verbally consents to ongoing [place] [facility].  For questions, please contact:  Natividad Brood, RN BSN [facility] Liaison  [phone] business mobile phone Toll free office [phone]

## 2016-06-17 DIAGNOSIS — E876 Hypokalemia: Secondary | ICD-10-CM

## 2016-06-17 LAB — CBC
HCT: 43.6 % (ref 36.0–46.0)
Hemoglobin: 14.5 g/dL (ref 12.0–15.0)
MCH: 30.3 pg (ref 26.0–34.0)
MCHC: 33.3 g/dL (ref 30.0–36.0)
MCV: 91.2 fL (ref 78.0–100.0)
Platelets: 171 10*3/uL (ref 150–400)
RBC: 4.78 MIL/uL (ref 3.87–5.11)
RDW: 13.4 % (ref 11.5–15.5)
WBC: 7.5 10*3/uL (ref 4.0–10.5)

## 2016-06-17 LAB — BASIC METABOLIC PANEL
ANION GAP: 7 (ref 5–15)
BUN: <5 mg/dL — ABNORMAL LOW (ref 6–20)
CALCIUM: 8.5 mg/dL — AB (ref 8.9–10.3)
CO2: 25 mmol/L (ref 22–32)
Chloride: 106 mmol/L (ref 101–111)
Creatinine, Ser: 0.71 mg/dL (ref 0.44–1.00)
GFR calc non Af Amer: >60 mL/min (ref >60)
Glucose, Bld: 129 mg/dL — ABNORMAL HIGH (ref 65–99)
Potassium: 2.8 mmol/L — ABNORMAL LOW (ref 3.5–5.1)
Sodium: 138 mmol/L (ref 135–145)

## 2016-06-17 MED ORDER — DOXYCYCLINE HYCLATE 100 MG PO TABS
100.0000 mg | ORAL_TABLET | Freq: Two times a day (BID) | ORAL | Status: DC
  Administered 2016-06-17: 100 mg via ORAL
  Filled 2016-06-17: qty 1

## 2016-06-17 MED ORDER — DOXYCYCLINE HYCLATE 100 MG PO TABS: 100.0000 mg | ORAL_TABLET | Freq: Two times a day (BID) | ORAL | 0 refills | Status: DC

## 2016-06-17 MED ORDER — POTASSIUM CHLORIDE CRYS ER 20 MEQ PO TBCR
40.0000 meq | EXTENDED_RELEASE_TABLET | Freq: Once | ORAL | Status: AC
  Administered 2016-06-17: 40 meq via ORAL
  Filled 2016-06-17: qty 2

## 2016-06-17 MED ORDER — ALPRAZOLAM 0.5 MG PO TABS
0.5000 mg | ORAL_TABLET | Freq: Once | ORAL | Status: AC
  Administered 2016-06-17: 0.5 mg via ORAL
  Filled 2016-06-17: qty 1

## 2016-06-17 MED ORDER — ONDANSETRON HCL 4 MG/2ML IJ SOLN
4.0000 mg | Freq: Four times a day (QID) | INTRAMUSCULAR | Status: DC | PRN
  Administered 2016-06-17 (×2): 4 mg via INTRAVENOUS
  Filled 2016-06-17 (×2): qty 2

## 2016-06-17 MED ORDER — ONDANSETRON HCL 4 MG PO TABS: 4.0000 mg | ORAL_TABLET | Freq: Four times a day (QID) | ORAL | Status: DC | PRN

## 2016-06-17 MED ORDER — POTASSIUM CHLORIDE ER 20 MEQ PO TBCR: 20.0000 meq | EXTENDED_RELEASE_TABLET | Freq: Every day | ORAL | 0 refills | Status: DC

## 2016-06-17 NOTE — Care Management Note (Signed)
Case Management Note  Patient Details  Name: ALYZZA ANDRINGA MRN: [id number] Date of Birth: [birth year]  Subjective/Objective:                  Admitted 06/14/16 s/p a fall. Patient wtih lactic acidosis, sepsis RLE cellulitis, hypokalemia. From home with daughter.  Royann Shivers (Daughter)     [phone]      PCP: [name]  Action/Plan: Plan is to d/c to home today with daughter.  Expected Discharge Date:  06/17/16               Expected Discharge Plan:  [place]  In-House Referral:     Discharge planning Services  CM Consult  Post Acute Care Choice:    Choice offered to:  Patient  DME Arranged:  3-N-1 DME Agency:  [facility]. (referral made with  [facility] [phone])  [place] Arranged:  RN, Nurse's Aide, PT, OT [facility] Agency:   [facility], referral made with [place] Planas @ [phone].  Status of Service:  Completed, signed off  If discussed at [place] of Stay Meetings, dates discussed:    Additional Comments:  [name], RN 06/17/2016, 3:11 PM

## 2016-06-17 NOTE — Discharge Summary (Signed)
Physician Discharge Summary  Denise Kelly AUQ:[id number] DOB: 1941-01-13 DOA: 06/14/2016  PCP: Jonathon Bellows, MD  Admit date: 06/14/2016 Discharge date: 06/17/2016  Recommendations for Outpatient Follow-up:  1. Continue doxycycline for 5 days on discharge 2. Continue potassium supplementation 3. Supplemented potassium prior to discharge   Discharge Diagnoses:  Active Problems:   Essential hypertension, benign   SIRS (systemic inflammatory response syndrome) (HCC)   Alcohol dependence ([place])   Lactic acidosis   Sepsis ([place])    Discharge Condition: stable   Diet recommendation: as tolerated   History of present illness:  76 y.o.femalewith medical history significant for hypertension, GERD, depression, hearing loss, skin cancer, former alcohol abuser, chronic back pain who presentedto the ED status post fall. She was found by her neighbor. Apparently pt fell sometime the night prior to the admission and could not get up and fell asleep on the floor. Pt does not recall details of the events that led to the fall. Patient was recently hospitalized and discharged between 3/9-3/15, for recurrent cellulitis. Infectious disease was consulted. Patient underwent MRI to rule out deeper infection and that was negative. She was thought to have a degree of venous insufficiency with known history of total knee replacement, initially placed on vancomycin and Rocephin and subsequently treated with Zosyn and then Keflex for a total of 3-4 weeks. Pt was stable on admission. BP was 129/86, HR 1010, T max 99.63F, RR 19, normal O2 saturation. Blood owrk showed WBC count of 14.6, potassium 2.7 (supplemented), lactic acid 5.4. She was started on vanco and zosyn for sepsis thought to be from lower extremity cellulitis.   Hospital Course:   Active Problems: Sepsis secondary to right lower extremity cellulitis / Lactic acidosis / Leukocytosis - Sepsis criteria met on admission with low grade fever, tachycardia,  tachypnea, leukocytosis and lactic acidosis - Source of infection is lower extremity cellulitis - Cellulitis improved  - Refused abx yesterday; will start doxy for 5 days starting today  - Leukocytosis and lactic acid WNL   Fall - No acute intracranial findings on CT head - No acute fractures on imaging studies - PT eval - HH, orders placed   Hypokalemia/ Hypomagnesemia  - Due to sepsis  - Supplemented prior to discharge - She has potassium supplementation at home   Acute kidney injury - Due to sepsis  - Cr now NWL  Mild troponin elevation - Due to sepsis - Trop 0.03 - No chest pain   Depression - Continue Celexa    DVT prophylaxis: Lovenox subQ Code Status: full code  Family Communication: no family at the bedside this am; spoke with her daughter over the phone 4/2 and informed her of discharge today     Consultants:   PT   Procedures:   None   Antimicrobials:   Vanco and zosyn 3/31 --> 06/16/2016; refused abx 4/2  Doxycycline 4/3 --> for 5 days on discharge   Signed:  Leisa Lenz, MD  [facility] 06/17/2016, 1:47 PM  Pager #: [phone]  Time spent in minutes: less than 30 minutes   Discharge Exam: Vitals:   06/17/16 0538 06/17/16 1328  BP: 129/68 118/64  Pulse: 77 78  Resp: 18 16  Temp: 98.2 F (36.8 C) 97.5 F (36.4 C)   Vitals:   06/16/16 1438 06/16/16 2042 06/17/16 0538 06/17/16 1328  BP: (!) 146/73 (!) 151/75 129/68 118/64  Pulse: 73 69 77 78  Resp: _0 Temp: 98.9 F (37.2 C) 97.6 F (  36.4 C) 98.2 F (36.8 C) 97.5 F (36.4 C)  TempSrc: Oral Oral Oral Oral  SpO2: 99% 97% 94% 97%  Weight:      Height:        General: Pt is alert, follows commands appropriately, not in acute distress Cardiovascular: Regular rate and rhythm, S1/S2 + Respiratory: Clear to auscultation bilaterally, no wheezing Abdominal: Soft, non tender, non distended, bowel sounds +, no guarding Extremities:no cyanosis, pulses palpable  bilaterally DP and PT Neuro: Grossly nonfocal  Discharge Instructions  Discharge Instructions    Call MD for:  persistant nausea and vomiting    Complete by:  As directed    Call MD for:  redness, tenderness, or signs of infection (pain, swelling, redness, odor or green/yellow discharge around incision site)    Complete by:  As directed    Call MD for:  severe uncontrolled pain    Complete by:  As directed    Diet - low sodium heart healthy    Complete by:  As directed    Discharge instructions    Complete by:  As directed    Continue doxycycline for 5 days on discharge   Increase activity slowly    Complete by:  As directed       Follow-up Information    WEBB, CAROL D, MD. Schedule an appointment as soon as possible for a visit in 1 week(s).   Specialty:  Family Medicine Contact information: [place] [address] [phone]          Allergies as of 06/17/2016      Reactions   Sulfa Antibiotics Hives, Swelling, Other (See Comments)   Reaction:  Facial/eye swelling    Ciprofloxacin Itching   Motrin [ibuprofen] Palpitations      Medication List    STOP taking these medications   cephALEXin 500 MG capsule Commonly known as:  KEFLEX     TAKE these medications   acetaminophen 500 MG tablet Commonly known as:  TYLENOL Take 1,000 mg by mouth every 6 (six) hours as needed for mild pain, moderate pain or headache.   acidophilus Caps capsule Take 2 capsules by mouth daily.   aspirin EC 81 MG tablet Take 81 mg by mouth at bedtime.   citalopram 20 MG tablet Commonly known as:  CELEXA Take 1 tablet (20 mg total) by mouth at bedtime.   CLARITIN 10 MG tablet Generic drug:  loratadine Take 10 mg by mouth daily.   doxycycline 100 MG tablet Commonly known as:  VIBRA-TABS Take 1 tablet (100 mg total) by mouth every 12 (twelve) hours.   estradiol 0.1 MG/GM vaginal cream Commonly known as:  ESTRACE Place 1 Applicatorful vaginally  at bedtime as needed (for vaginal discomfort).   folic acid 1 MG tablet Commonly known as:  FOLVITE Take 1 tablet (1 mg total) by mouth daily.   furosemide 20 MG tablet Commonly known as:  LASIX Take 1 tablet (20 mg total) by mouth daily.   gabapentin 100 MG capsule Commonly known as:  NEURONTIN Take 100-300 mg by mouth 2 (two) times daily. 200 mg in the morning & 300 mg at bedtime   hydrOXYzine 25 MG tablet Commonly known as:  ATARAX/VISTARIL Take 50 mg by mouth at bedtime as needed for anxiety (and/or sleep).   ipratropium-albuterol 0.5-2.5 (3) MG/3ML Soln Commonly known as:  DUONEB Take 3 mLs by nebulization every 4 (four) hours as needed.   methocarbamol 500 MG tablet Commonly known as:  ROBAXIN Take 250-500 mg by mouth 2 (two) times daily as needed (for leg cramps/spasms.).   ondansetron 4 MG disintegrating tablet Commonly known as:  ZOFRAN-ODT Take 4 mg by mouth every 6 (six) hours as needed for nausea or vomiting.   oxyCODONE 5 MG immediate release tablet Commonly known as:  Oxy IR/ROXICODONE Take 1 tablet (5 mg total) by mouth every 6 (six) hours as needed for severe pain.   potassium chloride 10 MEQ tablet Commonly known as:  K-DUR Take 1 tablet (10 mEq total) by mouth daily.   pramipexole 0.125 MG tablet Commonly known as:  MIRAPEX Take 0.375 mg by mouth 2 (two) times daily as needed (for restless leg syndrome).   senna-docusate 8.6-50 MG tablet Commonly known as:  SENOKOT S Take 1 tablet by mouth 2 (two) times daily.   SYSTANE OP Apply 1-2 drops to eye 3 (three) times daily as needed (for dry eyes).   thiamine 100 MG tablet Take 1 tablet (100 mg total) by mouth daily.   traMADol 50 MG tablet Commonly known as:  ULTRAM Take 1 tablet (50 mg total) by mouth every 6 (six) hours as needed for moderate pain.   traZODone 100 MG tablet Commonly known as:  DESYREL Take 100 mg by mouth at bedtime as needed for sleep.   zolpidem 10 MG tablet Commonly known  as:  AMBIEN Take 5 mg by mouth at bedtime as needed for sleep.        The results of significant diagnostics from this hospitalization (including imaging, microbiology, ancillary and laboratory) are listed below for reference.    Significant Diagnostic Studies: Dg Chest 2 View  Result Date: 06/14/2016 CLINICAL DATA:  [place] last night, laid on carpeted floor all night long, complaining of pain at RIGHT knee, RIGHT thigh, and RIGHT chin, some chest pain in route to hospital, history hypertension, GERD, former smoker, ethanol abuse EXAM: CHEST  2 VIEW COMPARISON:  02/25/2016 FINDINGS: Enlargement of cardiac silhouette. Mediastinal contours and pulmonary vascularity normal. Central peribronchial thickening without infiltrate, pleural effusion, or pneumothorax. Bones demineralized without fracture. IMPRESSION: Mild enlargement of cardiac silhouette. Bronchitic changes without infiltrate. Electronically Signed   By: Lavonia Dana M.D.   On: 06/14/2016 14:28   Dg Lumbar Spine Complete  Result Date: 06/14/2016 CLINICAL DATA:  [place] last night, laid on carpeted floor all night long, complaining of pain at RIGHT knee, RIGHT thigh, and RIGHT shin, some chest pain in route to hospital, septic, history hypertension, GERD, former smoker, ethanol abuse EXAM: LUMBAR SPINE - COMPLETE 4+ VIEW COMPARISON:  MRI lumbar spine 01/23/2016 FINDINGS: Severe diffuse osseous demineralization limiting assessment. Five non-rib-bearing lumbar vertebra. Multilevel disc space narrowing and endplate spur formation. Scattered facet degenerative changes. Chronic anterior height loss at L1 unchanged. Minimal anterolisthesis at L4-L5 and retrolisthesis at L1-L2 unchanged. Vertebral body heights maintained. No acute fracture, additional subluxation, or bone destruction. No gross evidence of spondylolysis. SI joints preserved. Scattered pelvic phleboliths and minimal atherosclerotic calcification aorta. IMPRESSION: Multilevel degenerative disc  and facet disease changes with osseous demineralization. No definite acute bony abnormalities. Electronically Signed   By: Lavonia Dana M.D.   On: 06/14/2016 14:31   Dg Pelvis 1-2 Views  Result Date: 06/14/2016 CLINICAL DATA:  [place] last night, laid on carpeted floor all night long, complaining of pain at RIGHT knee, RIGHT thigh, and RIGHT shin, some chest pain in route to hospital, septic, history hypertension, GERD, former smoker, ethanol abuse EXAM: PELVIS - 1-2 VIEW COMPARISON:  01/03/2014 FINDINGS: IM  nail with compression screw at proximal LEFT femur post ORIF of an intertrochanteric fracture. Diffuse osseous demineralization. Hip and SI joint spaces preserved. No acute fracture, dislocation, or bone destruction. IMPRESSION: No acute osseous abnormalities. Osseous demineralization with prior ORIF of the proximal LEFT femur. Electronically Signed   By: Lavonia Dana M.D.   On: 06/14/2016 14:33   Ct Head Wo Contrast  Result Date: 06/14/2016 CLINICAL DATA:  Altered mental status. EXAM: CT HEAD WITHOUT CONTRAST CT CERVICAL SPINE WITHOUT CONTRAST TECHNIQUE: Multidetector CT imaging of the head and cervical spine was performed following the standard protocol without intravenous contrast. Multiplanar CT image reconstructions of the cervical spine were also generated. COMPARISON:  Brain MR dated 12/01/2014 and head CT dated 11/30/2014. FINDINGS: CT HEAD FINDINGS Brain: Streak artifacts from metal embolization coils at the skull base. Motion artifacts simulating subdural hematomas on image number 25 of series 5, not seen on repeat imaging through that area. Diffusely enlarged ventricles and subarachnoid spaces. Patchy white matter low density in both cerebral hemispheres. Stable old right basal ganglia lacunar infarct or prominent perivascular space. No intracranial hemorrhage, mass lesion or CT evidence of acute infarction. Vascular: No hyperdense vessel or unexpected calcification. Skull: Normal. Negative for  fracture or focal lesion. Sinuses/Orbits: Status post bilateral cataract removal. The included portions of the paranasal sinuses are normally pneumatized. Other: Embolization coils in the region of the left cavernous sinus. CT CERVICAL SPINE FINDINGS Alignment: Reversal of the normal lordosis in the lower cervical spine. No subluxations. Skull base and vertebrae: No acute fracture. No primary bone lesion or focal pathologic process. Soft tissues and spinal canal: No prevertebral fluid or swelling. No visible canal hematoma. Disc levels: Marked disc space narrowing at the C5-6 and C6-7 levels with mild to moderate anterior and mild posterior spur formation at those levels. Minimal anterior spur formation at the C7-T1 level. Upper chest: Small amount of right apical parenchymal scarring and left apical pleural and parenchymal scarring. Other: Motion artifacts. The images were repeated to eliminate these. IMPRESSION: 1. No acute intracranial or cervical spine abnormality. 2. Mildly progressive diffuse cerebral atrophy and chronic small vessel white matter ischemic changes in both cerebral hemispheres. 3. Cervical spine degenerative changes. Electronically Signed   By: Claudie Revering M.D.   On: 06/14/2016 15:49   Ct Cervical Spine Wo Contrast  Result Date: 06/14/2016 CLINICAL DATA:  Altered mental status. EXAM: CT HEAD WITHOUT CONTRAST CT CERVICAL SPINE WITHOUT CONTRAST TECHNIQUE: Multidetector CT imaging of the head and cervical spine was performed following the standard protocol without intravenous contrast. Multiplanar CT image reconstructions of the cervical spine were also generated. COMPARISON:  Brain MR dated 12/01/2014 and head CT dated 11/30/2014. FINDINGS: CT HEAD FINDINGS Brain: Streak artifacts from metal embolization coils at the skull base. Motion artifacts simulating subdural hematomas on image number 25 of series 5, not seen on repeat imaging through that area. Diffusely enlarged ventricles and  subarachnoid spaces. Patchy white matter low density in both cerebral hemispheres. Stable old right basal ganglia lacunar infarct or prominent perivascular space. No intracranial hemorrhage, mass lesion or CT evidence of acute infarction. Vascular: No hyperdense vessel or unexpected calcification. Skull: Normal. Negative for fracture or focal lesion. Sinuses/Orbits: Status post bilateral cataract removal. The included portions of the paranasal sinuses are normally pneumatized. Other: Embolization coils in the region of the left cavernous sinus. CT CERVICAL SPINE FINDINGS Alignment: Reversal of the normal lordosis in the lower cervical spine. No subluxations. Skull base and vertebrae: No acute fracture. No  primary bone lesion or focal pathologic process. Soft tissues and spinal canal: No prevertebral fluid or swelling. No visible canal hematoma. Disc levels: Marked disc space narrowing at the C5-6 and C6-7 levels with mild to moderate anterior and mild posterior spur formation at those levels. Minimal anterior spur formation at the C7-T1 level. Upper chest: Small amount of right apical parenchymal scarring and left apical pleural and parenchymal scarring. Other: Motion artifacts. The images were repeated to eliminate these. IMPRESSION: 1. No acute intracranial or cervical spine abnormality. 2. Mildly progressive diffuse cerebral atrophy and chronic small vessel white matter ischemic changes in both cerebral hemispheres. 3. Cervical spine degenerative changes. Electronically Signed   By: Claudie Revering M.D.   On: 06/14/2016 15:49   Mr Tibia Fibula Right Wo Contrast  Result Date: 05/24/2016 CLINICAL DATA:  Bilateral leg pain.  Cellulitis. EXAM: MRI OF LOWER RIGHT EXTREMITY WITHOUT CONTRAST TECHNIQUE: Multiplanar, multisequence MR imaging of the right lower extremity was performed. No intravenous contrast was administered. COMPARISON:  None. FINDINGS: Bones/Joint/Cartilage Bilateral total knee arthroplasties with  susceptibility artifact partially obscuring the adjacent soft tissue and osseous structures. No periarticular fluid collection or osteolysis. No focal marrow signal abnormality. No fracture or dislocation. Normal alignment. No periosteal reaction or bone destruction. Ligaments, Muscles and Tendons Muscles are normal. No muscle edema. No intramuscular fluid collection. Soft tissue No fluid collection or hematoma. No soft tissue mass. Asymmetric mild soft tissue edema and skin thickening of the right lower leg which may be secondary to mild cellulitis. IMPRESSION: 1. No osseous abnormality of the right tibia or fibula. 2. Asymmetric mild soft tissue edema and skin thickening of the right lower leg which may be secondary to mild cellulitis. No focal fluid collection to suggest an abscess. Electronically Signed   By: Kathreen Devoid   On: 05/24/2016 14:11   Dg Femur Min 2 Views Right  Result Date: 06/14/2016 CLINICAL DATA:  [place] last night, laid on carpeted floor all night long, complaining of pain at RIGHT knee, RIGHT thigh, and RIGHT shin, some chest pain in route to hospital, history hypertension, GERD, former smoker, ethanol abuse EXAM: RIGHT FEMUR 2 VIEWS COMPARISON:  None FINDINGS: Osseous demineralization. Hip joint space preserved. RIGHT knee prosthetic components noted without surrounding lucency. No acute fracture, dislocation, or bone destruction. IMPRESSION: No acute osseous abnormalities. Electronically Signed   By: Lavonia Dana M.D.   On: 06/14/2016 14:36    Microbiology: Recent Results (from the past 240 hour(s))  Blood Culture (routine x 2)     Status: None (Preliminary result)   Collection Time: 06/14/16 12:20 PM  Result Value Ref Range Status   Specimen Description BLOOD LEFT HAND  Final   Special Requests BOTTLES DRAWN AEROBIC AND ANAEROBIC 5CC  Final   Culture NO GROWTH 3 DAYS  Final   Report Status PENDING  Incomplete  Blood Culture (routine x 2)     Status: None (Preliminary result)    Collection Time: 06/14/16 12:30 PM  Result Value Ref Range Status   Specimen Description BLOOD RIGHT ANTECUBITAL  Final   Special Requests BOTTLES DRAWN AEROBIC AND ANAEROBIC 10CC  Final   Culture NO GROWTH 3 DAYS  Final   Report Status PENDING  Incomplete     Labs: Basic Metabolic Panel:  Recent Labs Lab 06/14/16 1159 06/14/16 1739 06/15/16 0344 06/16/16 0401 06/17/16 0546  NA 141  --  141 142 138  K 2.7*  --  2.5* 2.8* 2.8*  CL 95*  --  105  107 106  CO2 28  --  _0 GLUCOSE 141*  --  108* 118* 129*  BUN 8  --  6 5* <5*  CREATININE 1.08* 0.83 0.83 0.75 0.71  CALCIUM 9.2  --  7.7* 7.8* 8.5*  MG  --  1.5*  --   --   --    Liver Function Tests:  Recent Labs Lab 06/14/16 1159 06/15/16 0344  AST 57* 44*  ALT 24 26  ALKPHOS 134* 98  BILITOT 1.6* 1.8*  PROT 7.1 5.2*  ALBUMIN 3.8 2.7*   No results for input(s): LIPASE, AMYLASE in the last 168 hours. No results for input(s): AMMONIA in the last 168 hours. CBC:  Recent Labs Lab 06/14/16 1159 06/14/16 1739 06/15/16 0344 06/16/16 0401 06/17/16 0546  WBC 14.6* 12.2* 9.7 8.1 7.5  NEUTROABS 12.0*  --   --   --   --   HGB 17.2* 14.0 13.4 12.5 14.5  HCT 48.8* 41.8 39.4 37.6 43.6  MCV 87.9 88.4 89.5 91.0 91.2  PLT 227 173 166 148* 171   Cardiac Enzymes:  Recent Labs Lab 06/14/16 1159 06/14/16 1739  CKTOTAL 471*  --   TROPONINI  --  0.03*   BNP: BNP (last 3 results)  Recent Labs  02/25/16 1839 05/23/16 2032  BNP 33.0 80.1    ProBNP (last 3 results) No results for input(s): PROBNP in the last 8760 hours.  CBG: No results for input(s): GLUCAP in the last 168 hours.

## 2016-06-17 NOTE — Progress Notes (Signed)
qPhysical Therapy Treatment Patient Details Name: Denise Kelly MRN: [id number] DOB: 10/12/1940 Today's Date: 06/17/2016    History of Present Illness Patient is a 76 yo female admitted 06/14/16 after fall during night.  Patient could not get up, so slept on floor.  Patient wtih lactic acidosis, sepsis RLE cellulitis, hypokalemia.    PMH:  HTN, depression, hearing loss, ETOH use (former), chronic back pain, recent hospitalization 05/23/16-05/29/16 with recurrent cellulitis    PT Comments    Pt performed increased gait and motivated to return home to private residence with intermittent assistance from friends and family.  Pt reports she has a life alert that is new and she will have when returning home.  Pt will require a 3:1 commode.  Spoke with supervising PT who is in agreement with equipment recommendations.      Follow Up Recommendations  Home health PT;Supervision - Intermittent     Equipment Recommendations  3in1 (PT)    Recommendations for Other Services OT consult     Precautions / Restrictions Precautions Precautions: Fall Restrictions Weight Bearing Restrictions: No    Mobility  Bed Mobility Overal bed mobility: Needs Assistance;Modified Independent       Supine to sit: Modified independent (Device/Increase time)     General bed mobility comments: Good technique no assistance needed.    Transfers Overall transfer level: Needs assistance Equipment used: Rolling walker (2 wheeled) Transfers: Sit to/from Stand Sit to Stand: Min guard         General transfer comment: Cues for hand placement to and from seated surface.    Ambulation/Gait Ambulation/Gait assistance: Min guard Ambulation Distance (Feet): 80 Feet Assistive device: Rolling walker (2 wheeled) Gait Pattern/deviations: Step-through pattern;Trunk flexed;Decreased stride length   Gait velocity interpretation: Below normal speed for age/gender General Gait Details: Cues for upper trunk control, RW  safety, trunk flexed with cues to correct.  Pt motivated to advance gait distance.     Stairs Stairs:  (reports she does not have stairs to enter her home.  )          Wheelchair Mobility    Modified Rankin (Stroke Patients Only)       Balance Overall balance assessment: Needs assistance   Sitting balance-Leahy Scale: Good       Standing balance-Leahy Scale: Poor                              Cognition Arousal/Alertness: Awake/alert Behavior During Therapy: WFL for tasks assessed/performed Overall Cognitive Status: Within Functional Limits for tasks assessed                                        Exercises      General Comments        Pertinent Vitals/Pain Pain Assessment: Faces Pain Score: 5  Pain Location: Lt hip/side (fall); Rt lower leg (cellulitis) Pain Descriptors / Indicators: Aching;Grimacing;Guarding;Sharp;Sore;Tender Pain Intervention(s): Limited activity within patient's tolerance;Repositioned    Home Living                      Prior Function            PT Goals (current goals can now be found in the care plan section) Acute Rehab PT Goals Patient Stated Goal: to go home Potential to Achieve Goals: Good Progress towards PT goals: Progressing toward  goals    Frequency    Min 3X/week      PT Plan Current plan remains appropriate    Co-evaluation             End of Session Equipment Utilized During Treatment: Gait belt Activity Tolerance: Patient limited by pain Patient left: with call bell/phone within reach;in chair;with chair alarm set Nurse Communication: Mobility status PT Visit Diagnosis: Unsteadiness on feet (R26.81);Difficulty in walking, not elsewhere classified (R26.2);Muscle weakness (generalized) (M62.81);Pain Pain - Right/Left:  (Bilateral) Pain - part of body: Leg     Time: 1237-1300 PT Time Calculation (min) (ACUTE ONLY): 23 min  Charges:  $Gait Training: 8-22  mins $Therapeutic Activity: 8-22 mins                    G Codes:       Governor Rooks, PTA pager [phone]    Cristela Blue 06/17/2016, 1:09 PM

## 2016-06-17 NOTE — Discharge Instructions (Signed)
Doxycycline delayed-release capsules What is this medicine? DOXYCYCLINE (dox i SYE kleen) is a tetracycline antibiotic. It is used to treat certain kinds of bacterial infections, Lyme disease, and malaria. It will not work for colds, flu, or other viral infections. This medicine may be used for other purposes; ask your health care provider or pharmacist if you have questions. COMMON BRAND NAME(S): Oracea What should I tell my health care provider before I take this medicine? They need to know if you have any of these conditions: -bowel disease like colitis -liver disease -long exposure to sunlight like working outdoors -an unusual or allergic reaction to doxycycline, tetracycline antibiotics, other medicines, foods, dyes, or preservatives -pregnant or trying to get pregnant -breast-feeding How should I use this medicine? Take this medicine by mouth with a full glass of water. Follow the directions on the prescription label. Do not crush or chew. The capsules may be opened and the pellets sprinkled on applesauce. Swallow the pellets whole without chewing. Follow with an 8 ounce glass of water to help you swallow all the pellets. Do not prepare a dose and store for later use. The applesauce mixture should be taken immediately after you prepare it. It is best to take this medicine without other food, but if it upsets your stomach take it with food. Take your medicine at regular intervals. Do not take your medicine more often than directed. Take all of your medicine as directed even if you think your are better. Do not skip doses or stop your medicine early. Talk to your pediatrician regarding the use of this medicine in children. While this drug may be prescribed for selected conditions, precautions do apply. Overdosage: If you think you have taken too much of this medicine contact a poison control center or emergency room at once. NOTE: This medicine is only for you. Do not share this medicine with  others. What if I miss a dose? If you miss a dose, take it as soon as you can. If it is almost time for your next dose, take only that dose. Do not take double or extra doses. What may interact with this medicine? -antacids -barbiturates -birth control pills -bismuth subsalicylate -carbamazepine -methoxyflurane -other antibiotics -phenytoin -vitamins that contain iron -warfarin This list may not describe all possible interactions. Give your health care provider a list of all the medicines, herbs, non-prescription drugs, or dietary supplements you use. Also tell them if you smoke, drink alcohol, or use illegal drugs. Some items may interact with your medicine. What should I watch for while using this medicine? Tell your doctor or health care professional if your symptoms do not improve. Do not treat diarrhea with over the counter products. Contact your doctor if you have diarrhea that lasts more than 2 days or if it is severe and watery. Do not take this medicine just before going to bed. It may not dissolve properly when you lay down and can cause pain in your throat. Drink plenty of fluids while taking this medicine to also help reduce irritation in your throat. This medicine can make you more sensitive to the sun. Keep out of the sun. If you cannot avoid being in the sun, wear protective clothing and use sunscreen. Do not use sun lamps or tanning beds/booths. If you are being treated for a sexually transmitted infection, avoid sexual contact until you have finished your treatment. Your sexual partner may also need treatment. Avoid antacids, aluminum, calcium, magnesium, and iron products for 4 hours before and  2 hours after taking a dose of this medicine. Birth control pills may not work properly while you are taking this medicine. Talk to your doctor about using an extra method of birth control. If you are using this medicine to prevent malaria, you should still protect yourself from  contact with mosquitos. Stay in screened-in areas, use mosquito nets, keep your body covered, and use an insect repellent. What side effects may I notice from receiving this medicine? Side effects that you should report to your doctor or health care professional as soon as possible: -allergic reactions like skin rash, itching or hives, swelling of the face, lips, or tongue -difficulty breathing -fever -itching in the rectal or genital area -pain on swallowing -redness, blistering, peeling or loosening of the skin, including inside the mouth -severe stomach pain or cramps -unusual bleeding or bruising -unusually weak or tired -yellowing of the eyes or skin Side effects that usually do not require medical attention (report to your doctor or health care professional if they continue or are bothersome): -diarrhea -loss of appetite -nausea, vomiting This list may not describe all possible side effects. Call your doctor for medical advice about side effects. You may report side effects to FDA at 1-800-FDA-1088. Where should I keep my medicine? Keep out of the reach of children. Store at room temperature, below 25 degrees C (77 degrees F). Protect from light. Keep container tightly closed. Throw away any unused medicine after the expiration date. Taking this medicine after the expiration date can make you seriously ill. NOTE: This sheet is a summary. It may not cover all possible information. If you have questions about this medicine, talk to your doctor, pharmacist, or health care provider.  2018 Elsevier/Gold Standard (2015-04-05 10:41:39)  

## 2016-06-17 NOTE — Progress Notes (Signed)
   06/17/16 1015  Clinical Encounter Type  Visited With Patient  Visit Type Other (Comment) ([place] consult)  Spiritual Encounters  Spiritual Needs Emotional  Stress Factors  Patient Stress Factors Health changes  Introduction to Pt. Pt reports going home today and feels blessed.

## 2016-06-18 ENCOUNTER — Other Ambulatory Visit: Payer: Self-pay

## 2016-06-18 ENCOUNTER — Telehealth: Payer: Self-pay | Admitting: Infectious Diseases

## 2016-06-18 NOTE — Patient Outreach (Signed)
[facility]) Care Management  06/18/2016  Denise Kelly 05/07/1940 [id number]  Subjective: none  Objective: none  Assessment: Referral received on 3/21. Client has been admitted 3 times since November 2017. Admitted 3/9-3/15 with Cellulitis of Right lower Leg. Readmitted on 3/31-4/3 with Sepsis due to lower extremity cellulitis; fall.  RNCM called for transition of care call. No answer. RNCM left voice message and client returned call prior to writer finishing note.  Client reports she has all her medications. Client states she called infectious disease provider regarding antibiotics prescribed and was instructed to continue to take cephalexin and doxyycline until she is seen by infectious disease.  Medication: declined to review medications over the phone.  Per client home health is scheduled to see her tomorrow.  RNCM offered to make home visit next week. Client is receptive to telephonic follow up next week.  Plan: telephonic call next week.  Thea Silversmith, RN, MSN, [place] Coordinator Cell: [phone]

## 2016-06-18 NOTE — Telephone Encounter (Signed)
Daughter called that pt had been rehospitalized.  She was not clear why her keflex was stopped and she was given a 5 day course of doxy.  She will resume her keflex until she has f/u in ID on 4-18

## 2016-06-19 LAB — CULTURE, BLOOD (ROUTINE X 2)
Culture: NO GROWTH
Culture: NO GROWTH

## 2016-06-25 ENCOUNTER — Other Ambulatory Visit: Payer: Self-pay

## 2016-06-25 NOTE — Patient Outreach (Signed)
[facility]) Care Management  06/25/2016  Denise Kelly Sep 27, 1940 [id number]  Subjective: none  Objective: none  Assessment: Referral received on 3/21. Client has been admitted 3 times since November 2017. Admitted 3/9-3/15 with Cellulitis of Right lower Leg. Readmitted on 3/31-4/3 with Sepsis due to lower extremity cellulitis; fall.  RNCM called for transition of care call. No answer. HIPPA compliant voice message left.   Plan: RNCM will call within the week in no return call.  Thea Silversmith, RN, MSN, [place] Coordinator Cell: [phone]

## 2016-06-26 ENCOUNTER — Other Ambulatory Visit: Payer: Self-pay

## 2016-06-26 NOTE — Patient Outreach (Signed)
[facility]) Care Management  06/26/2016  Denise Kelly 1940/03/25 [id number]  Subjective: "this all feels invasive".  Objective: none-telephonic assessment.  RNCM received return call from client.   Assessment:  Referral received on 3/21. Client has been admitted 3 times since November 2017. Admitted 3/9-3/15 with Cellulitis of Right lower Leg. Readmitted on 3/31-4/3 with Sepsis due to lower extremity cellulitis; fall.   Ms. Denise Kelly reports she is doing well. She reports her cellulitis is improving and that home health has initiated care: nursing, physical therapy and bath aid.  RNCM attempted to discuss medications and complete some assessment questions, but client declines stating, "this all feels invasive". RNCM discussed the Care Management program again and client agrees to a home visit.  RNCM encouraged client to call as needed. RNCM provided 24 hour nurse advice line number and encouraged client to use as needed.  Plan: home visit next week.  Thea Silversmith, RN, MSN, [place] Coordinator Cell: [phone]

## 2016-07-01 ENCOUNTER — Other Ambulatory Visit: Payer: Self-pay

## 2016-07-01 NOTE — Patient Outreach (Signed)
[facility]) Care Management  07/01/2016  Denise Kelly 12/02/1940 [id number]  Care coordination: RNCM received message from client stating she needs to change the date of the home visit. [name] states a conflict with home health physical therapy.  Client is voicing no concerns at this time.  Plan: home visit rescheduled.  Thea Silversmith, RN, MSN, [place] Coordinator Cell: [phone]

## 2016-07-02 ENCOUNTER — Ambulatory Visit (INDEPENDENT_AMBULATORY_CARE_PROVIDER_SITE_OTHER): Payer: Medicare Other | Admitting: Infectious Diseases

## 2016-07-02 ENCOUNTER — Inpatient Hospital Stay: Payer: Self-pay | Admitting: Internal Medicine

## 2016-07-02 ENCOUNTER — Encounter: Payer: Self-pay | Admitting: Infectious Diseases

## 2016-07-02 DIAGNOSIS — L03115 Cellulitis of right lower limb: Secondary | ICD-10-CM

## 2016-07-02 DIAGNOSIS — R609 Edema, unspecified: Secondary | ICD-10-CM | POA: Insufficient documentation

## 2016-07-02 DIAGNOSIS — R600 Localized edema: Secondary | ICD-10-CM | POA: Diagnosis not present

## 2016-07-02 NOTE — Assessment & Plan Note (Signed)
I don't believe she currently has cellulitis that is infectious.  She needs compression stockings. She needs more aggressive diuresis I don't see any current infectious reason to prohibit her from back surgery.  Will see her back 6 weeks.

## 2016-07-02 NOTE — Assessment & Plan Note (Signed)
I discussed with pt and her daughter that she needs more aggressive diuresis.  They will f/u with her PCP

## 2016-07-02 NOTE — Progress Notes (Signed)
   Subjective:    Patient ID: Denise Kelly, female    DOB: 1940-11-21, 76 y.o.   MRN: [id number]  HPI 76 yo F with hx of L THR (11-2015) and R TKR (12-2013).  She comes to ED on 3-9 with hx of RLE cellulitis present since 02-2016. She had been hospitalized at that time with cellulitis.  she has been seen by her PCP for this- prev given keflex, then switched to doxy last week.  She developed worsening pain, erythema, and swelling during this period.  In ED her WBC was normal, she was afebrile. U/s (-) for DVT. She was started on vancomycin, ceftriaxone, and lasix.  She underwent MRI on 3-10 which suggested mild cellulitis, no osteo or abscess.  Cardiac echo (02-2016) showed nl ef and nl wall motion.   She was ultimately d/c on keflex and then fell on 3-31. She was felt to be septic and was again treated with vanco/zosyn. She was d/c home on 4-3 on doxy for 5 days.  She resumed her keflex at d/c.  Has had more erythema in her LE recently.  Has not been able to wear her compression stockings due to difficulty with with reaching her LE.  Having temps to 100.1 since 4-15, taking tylenol frequently.  Was started on laisx.  Has a new recliner. Ambulates in her apt. Has home PT  Review of Systems Please see HPI. 12 point ROS o/w (-)     Objective:   Physical Exam  Constitutional: She appears well-developed and well-nourished.  Musculoskeletal:       Legs:         Assessment & Plan:

## 2016-07-03 ENCOUNTER — Ambulatory Visit: Payer: Self-pay

## 2016-07-04 ENCOUNTER — Other Ambulatory Visit: Payer: Self-pay

## 2016-07-04 NOTE — Patient Outreach (Addendum)
Denise Kelly) Care Management   07/04/2016  Denise Kelly January 10, 1941 [id number]  Denise Kelly is an 76 y.o. female  Subjective: client reports the redness is improved, but reports swelling in left leg.  Objective:  BP 128/72   Pulse 94   Resp 20   Ht 1.715 m (5' 7.5")   Wt 216 lb (98 kg)   SpO2 91%   BMI 33.33 kg/m   Review of Systems  Respiratory:       Decreased breath sound, respirations even unlabored.  Cardiovascular: Positive for leg swelling.       Heart sounds regular. Non pitting 2+ to both legs noted.  Skin:       Right leg with slight redness noted. Skin intact.    Physical Exam  Encounter Medications:   Outpatient Encounter Prescriptions as of 07/04/2016  Medication Sig Note  . acetaminophen (TYLENOL) 500 MG tablet Take 1,000 mg by mouth every 6 (six) hours as needed for mild pain, moderate pain or headache.    Marland Kitchen acidophilus (RISAQUAD) CAPS capsule Take 2 capsules by mouth daily.   Marland Kitchen aspirin EC 81 MG tablet Take 81 mg by mouth at bedtime.    . cephALEXin (KEFLEX) 500 MG capsule    . citalopram (CELEXA) 20 MG tablet Take 1 tablet (20 mg total) by mouth at bedtime.   Marland Kitchen estradiol (ESTRACE) 0.1 MG/GM vaginal cream Place 1 Applicatorful vaginally at bedtime as needed (for vaginal discomfort).   . furosemide (LASIX) 20 MG tablet Take 1 tablet (20 mg total) by mouth daily. (Patient taking differently: Take 20 mg by mouth daily as needed. )   . gabapentin (NEURONTIN) 100 MG capsule Take 100-300 mg by mouth 2 (two) times daily. 200 mg in the morning & 300 mg at bedtime   . hydrOXYzine (ATARAX/VISTARIL) 25 MG tablet Take 50 mg by mouth at bedtime as needed for anxiety (and/or sleep).    . loratadine (CLARITIN) 10 MG tablet Take 10 mg by mouth daily.    . methocarbamol (ROBAXIN) 500 MG tablet Take 250-500 mg by mouth 2 (two) times daily as needed (for leg cramps/spasms.).    Marland Kitchen ondansetron (ZOFRAN-ODT) 4 MG disintegrating tablet Take 4 mg by mouth every  6 (six) hours as needed for nausea or vomiting.    Marland Kitchen oxyCODONE (OXY IR/ROXICODONE) 5 MG immediate release tablet Take 1 tablet (5 mg total) by mouth every 6 (six) hours as needed for severe pain.   Vladimir Faster Glycol-Propyl Glycol (SYSTANE OP) Apply 1-2 drops to eye 3 (three) times daily as needed (for dry eyes).   . potassium chloride (K-DUR) 10 MEQ tablet Take 1 tablet (10 mEq total) by mouth daily. (Patient taking differently: Take 10 mEq by mouth daily as needed (with lasix). )   . pramipexole (MIRAPEX) 0.125 MG tablet Take 0.375 mg by mouth 2 (two) times daily as needed (for restless leg syndrome).    Marland Kitchen senna-docusate (SENOKOT S) 8.6-50 MG tablet Take 1 tablet by mouth 2 (two) times daily.   . traZODone (DESYREL) 100 MG tablet Take 100 mg by mouth at bedtime as needed for sleep. 07/04/2016: Takes as needed.  . zolpidem (AMBIEN) 10 MG tablet Take 5 mg by mouth at bedtime as needed for sleep.   . folic acid (FOLVITE) 1 MG tablet Take 1 tablet (1 mg total) by mouth daily. (Patient not taking: Reported on 07/04/2016)   . spironolactone (ALDACTONE) 25 MG tablet    . thiamine 100 MG tablet  Take 1 tablet (100 mg total) by mouth daily. (Patient not taking: Reported on 07/04/2016)   . traMADol (ULTRAM) 50 MG tablet Take 1 tablet (50 mg total) by mouth every 6 (six) hours as needed for moderate pain. (Patient not taking: Reported on 07/04/2016) 05/07/2016: Rarely    No facility-administered encounter medications on file as of 07/04/2016.     Functional Status:   In your present state of health, do you have any difficulty performing the following activities: 07/04/2016 07/04/2016  Hearing? [employer]? N N  Difficulty concentrating or making decisions? N N  Walking or climbing stairs? Y Y  Dressing or bathing? N N  Doing errands, shopping? - Y  Conservation officer, nature and eating ? - N  Using the Toilet? - N  In the past six months, have you accidently leaked urine? - Y  Do you have problems with loss of bowel  control? - N  Managing your Medications? - N  Managing your Finances? - N  Housekeeping or managing your Housekeeping? - N  Some recent data might be hidden    Fall/Depression Screening:    PHQ 2/9 Scores 07/04/2016  PHQ - 2 Score 0   Fall Risk  07/04/2016  Falls in the past year? Yes  Number falls in past yr: 2 or more  Injury with Fall? No  Risk for fall due to : Impaired balance/gait;History of fall(s)  Follow up Falls prevention discussed;Education provided   Assessment:  Referral received on 3/21. Client has been admitted 3 times since November 2017. Admitted 3/9-3/15 with Cellulitis of Right lower Leg. Readmitted on 3/31-4/3 with Sepsis due to lower extremity cellulitis; fall.   RNCM completed home visit.  Medications reviewed with client. She declined RNCM to review medication bottles. Client denies any concerns regarding medications.  Client had follow up with infectious disease. RNCM reinforced instructions per infectious disease.  Upcoming appointment with primary care scheduled for Monday.  Client expressed she does not drive any more therefore does not have transportation. RNCM informed client on [facility] if transportation is needed to appointment. Client states her daughter prefers to take her to her appointments. Client expressed she does not get out. Client declines social work referral for community resources to increase outside activities/increase socialization. Client is aware she can call RNCM if she changes her mind.  Discussed compression stockings-RNCM encouraged client to discuss with her daughter and a person assist her with taking care of her cats to help her with managing compression stockings. RNCM offered resources for personal care assistance. Client declines at this time.  Plan: follow up transition of care call next week. THN CM Care Plan Problem One     Most Recent Value  Care Plan Problem One  at risk for readmission due to  chronic illness  Role Documenting the Problem One  Care Management [place] for Problem One  Active  THN Long Term Goal (31-90 days)  Client will verbalize at least three strategies for self management of condition within the next 31-60 days.  THN Long Term Goal Start Date  06/04/16  Interventions for Problem One Long Term Goal  provided [facility] calender/organizer and explained how to use, reviewed attending scheduled follow up visits with providers, reviewed and encouraged use of 24 hour nurse advice line as needed, reviwed signs/symptoms of cellulitis.  THN CM Short Term Goal #1 (0-30 days)  Client will verbalize worsening signs/symptoms of infection.  THN CM Short Term Goal #1  Start Date  06/04/16  Interventions for Short Term Goal #1  provided and reviewed EMMI handout regarding cellulitis, fall prevention,   THN CM Short Term Goal #2 (0-30 days)  Client will attend follow up visits as scheduled within the next 30 days.  THN CM Short Term Goal #2 Start Date  06/04/16  [facility] CM Short Term Goal #2 Met Date  06/26/16     Thea Silversmith, RN, MSN, [place] Coordinator Cell: [phone]

## 2016-07-07 ENCOUNTER — Other Ambulatory Visit: Payer: Self-pay | Admitting: Neurosurgery

## 2016-07-07 ENCOUNTER — Other Ambulatory Visit: Payer: Self-pay | Admitting: Infectious Diseases

## 2016-07-08 ENCOUNTER — Telehealth: Payer: Self-pay | Admitting: *Deleted

## 2016-07-08 NOTE — Telephone Encounter (Signed)
Does patient need to continue Keflex?  Message routed to Dr. Johnnye Sima for response.

## 2016-07-09 ENCOUNTER — Telehealth: Payer: Self-pay | Admitting: *Deleted

## 2016-07-09 NOTE — Telephone Encounter (Signed)
Yes please

## 2016-07-09 NOTE — Telephone Encounter (Signed)
Rx for Keflex sent to pharmacy per Dr. Johnnye Sima.

## 2016-07-10 ENCOUNTER — Other Ambulatory Visit: Payer: Self-pay

## 2016-07-10 NOTE — Patient Outreach (Signed)
[place] [facility]) Care Management  [place]  07/10/2016   Denise Kelly Feb 05, 1941 [id number]  Subjective: Client is without issues or concerns at this time.  Objective: none-telephonic call.  Encounter Medications:  Outpatient Encounter Prescriptions as of 07/10/2016  Medication Sig Note  . acetaminophen (TYLENOL) 500 MG tablet Take 1,000 mg by mouth every 6 (six) hours as needed for mild pain, moderate pain or headache.    Marland Kitchen acidophilus (RISAQUAD) CAPS capsule Take 2 capsules by mouth daily.   Marland Kitchen aspirin EC 81 MG tablet Take 81 mg by mouth at bedtime.    . cephALEXin (KEFLEX) 500 MG capsule TAKE (1) CAPSULE TWICE DAILY.   . citalopram (CELEXA) 20 MG tablet Take 1 tablet (20 mg total) by mouth at bedtime.   Marland Kitchen estradiol (ESTRACE) 0.1 MG/GM vaginal cream Place 1 Applicatorful vaginally at bedtime as needed (for vaginal discomfort).   . folic acid (FOLVITE) 1 MG tablet Take 1 tablet (1 mg total) by mouth daily. (Patient not taking: Reported on 07/04/2016)   . furosemide (LASIX) 20 MG tablet Take 1 tablet (20 mg total) by mouth daily. (Patient taking differently: Take 20 mg by mouth daily as needed. )   . gabapentin (NEURONTIN) 100 MG capsule Take 100-300 mg by mouth 2 (two) times daily. 200 mg in the morning & 300 mg at bedtime   . hydrOXYzine (ATARAX/VISTARIL) 25 MG tablet Take 50 mg by mouth at bedtime as needed for anxiety (and/or sleep).    . loratadine (CLARITIN) 10 MG tablet Take 10 mg by mouth daily.    . methocarbamol (ROBAXIN) 500 MG tablet Take 250-500 mg by mouth 2 (two) times daily as needed (for leg cramps/spasms.).    Marland Kitchen ondansetron (ZOFRAN-ODT) 4 MG disintegrating tablet Take 4 mg by mouth every 6 (six) hours as needed for nausea or vomiting.    Marland Kitchen oxyCODONE (OXY IR/ROXICODONE) 5 MG immediate release tablet Take 1 tablet (5 mg total) by mouth every 6 (six) hours as needed for severe pain.   Vladimir Faster Glycol-Propyl Glycol (SYSTANE OP) Apply 1-2 drops to  eye 3 (three) times daily as needed (for dry eyes).   . potassium chloride (K-DUR) 10 MEQ tablet Take 1 tablet (10 mEq total) by mouth daily. (Patient taking differently: Take 10 mEq by mouth daily as needed (with lasix). )   . pramipexole (MIRAPEX) 0.125 MG tablet Take 0.375 mg by mouth 2 (two) times daily as needed (for restless leg syndrome).    Marland Kitchen senna-docusate (SENOKOT S) 8.6-50 MG tablet Take 1 tablet by mouth 2 (two) times daily.   Marland Kitchen spironolactone (ALDACTONE) 25 MG tablet    . thiamine 100 MG tablet Take 1 tablet (100 mg total) by mouth daily. (Patient not taking: Reported on 07/04/2016)   . traMADol (ULTRAM) 50 MG tablet Take 1 tablet (50 mg total) by mouth every 6 (six) hours as needed for moderate pain. (Patient not taking: Reported on 07/04/2016) 05/07/2016: Rarely   . traZODone (DESYREL) 100 MG tablet Take 100 mg by mouth at bedtime as needed for sleep. 07/04/2016: Takes as needed.  . zolpidem (AMBIEN) 10 MG tablet Take 5 mg by mouth at bedtime as needed for sleep.    No facility-administered encounter medications on file as of 07/10/2016.     Functional Status:  In your present state of health, do you have any difficulty performing the following activities: 07/04/2016 07/04/2016  Hearing? [place]? N N  Difficulty concentrating or making decisions? N  N  Walking or climbing stairs? Y Y  Dressing or bathing? Y N  Doing errands, shopping? - Y  Conservation officer, nature and eating ? - N  Using the Toilet? - N  In the past six months, have you accidently leaked urine? - Y  Do you have problems with loss of bowel control? - N  Managing your Medications? - N  Managing your Finances? - N  Housekeeping or managing your Housekeeping? - N  Some recent data might be hidden    Fall/Depression Screening: Fall Risk  07/04/2016  Falls in the past year? Yes  Number falls in past yr: 2 or more  Injury with Fall? No  Risk for fall due to : Impaired balance/gait;History of fall(s)  Follow up Falls  prevention discussed;Education provided   PHQ 2/9 Scores 07/04/2016  PHQ - 2 Score 0   Fall Risk  07/04/2016  Falls in the past year? Yes  Number falls in past yr: 2 or more  Injury with Fall? No  Risk for fall due to : Impaired balance/gait;History of fall(s)  Follow up Falls prevention discussed;Education provided   Assessment: Referral received on 3/21. Client has been admitted 3 times since November 2017. Admitted 3/9-3/15 with Cellulitis of Right lower Leg. Readmitted on 3/31-4/3 with Sepsis due to lower extremity cellulitis; fall.  RNCM called for transition of care. Client without questions or concerns at this time. Mrs. Coriz reports she is scheduled for back surgery on May 3. She reports she will be in the hospital about 4-5 days followed by an inpatient rehabilitations stay.  Plan: RNCM will follow up next week.  Thea Silversmith, RN, MSN, [place] Coordinator Cell: [phone]

## 2016-07-11 IMAGING — CR DG CHEST 2V
1 series · 1 of 1 positions shown · non-contrast
Comparison: 12/29/2013

CLINICAL DATA: Shortness of breath.  Altered mental status.

EXAM:
CHEST  2 VIEW

[w chest lat]
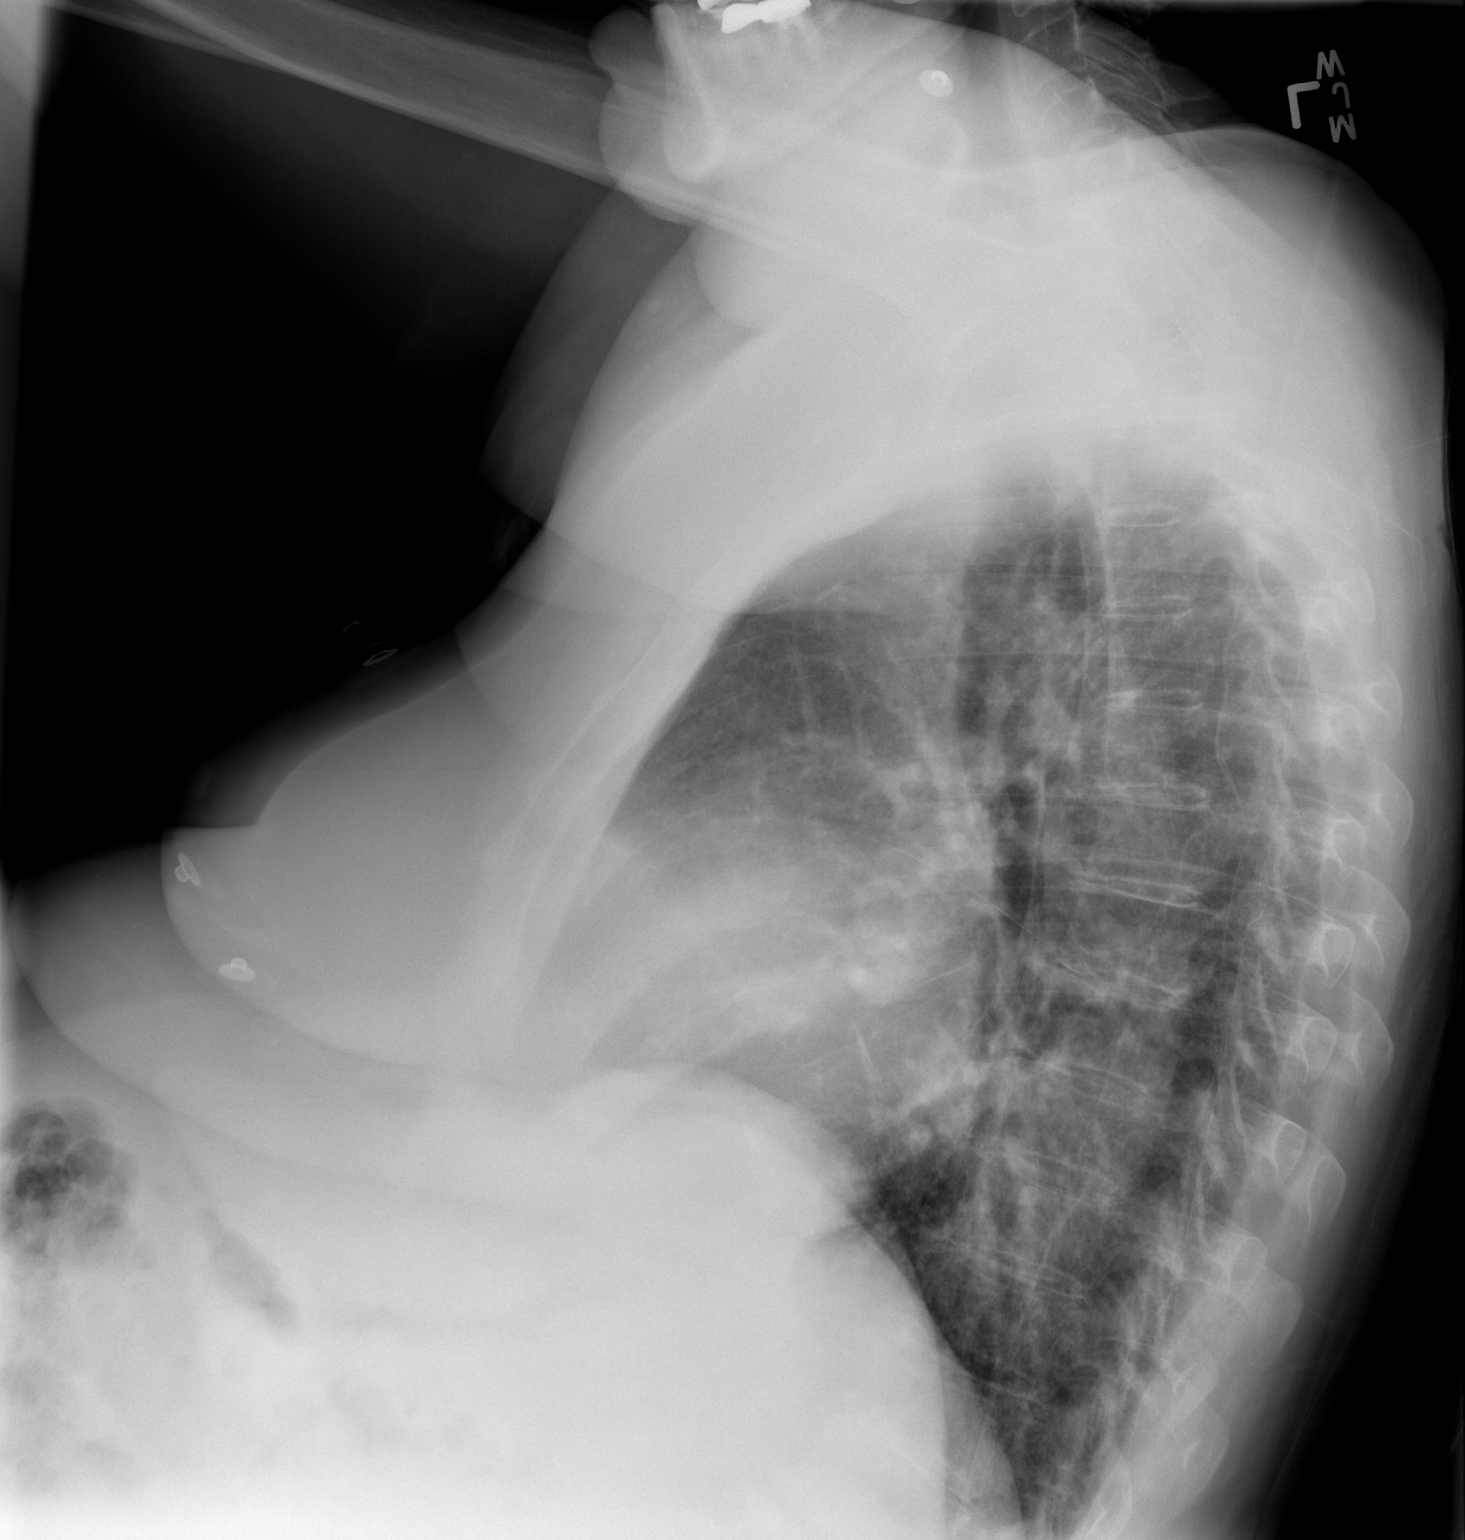

[1 of 1 positions shown; findings below may reference images not displayed]

FINDINGS: Chronic cardiomegaly and mild aortic tortuosity. Stable appearance
of the hila. There is no edema, consolidation, effusion (excluding
the extreme inferior posterior costophrenic sulci which are excluded
from view), or pneumothorax.
IMPRESSION: No active cardiopulmonary disease.

## 2016-07-15 ENCOUNTER — Other Ambulatory Visit: Payer: Self-pay

## 2016-07-15 NOTE — Patient Outreach (Signed)
[facility]) Care Management  07/15/2016  Denise Kelly 14-Dec-1940 [id number]   Subjective: none  Objective: none  Assessment: Referral received on 3/21. Client has been admitted 3 times since November 2017. Admitted 3/9-3/15 with Cellulitis of Right lower Leg. Readmitted on 3/31-4/3 with Sepsis due to lower extremity cellulitis; fall.  Per client request, RNCM called back after 2 pm. No answer. HIPPA compliant message left.   Plan: Await return call. Continue to follow.  Thea Silversmith, RN, MSN, [place] Coordinator Cell: [phone]

## 2016-07-15 NOTE — Patient Outreach (Signed)
[place] [facility]) Care Management  07/15/2016  Denise Kelly Mar 26, 1940 [id number]   Subjective: none  Objective: none  Assessment: Referral received on 3/21. Client has been admitted 3 times since November 2017. Admitted 3/9-3/15 with Cellulitis of Right lower Leg. Readmitted on 3/31-4/3 with Sepsis due to lower extremity cellulitis; fall.  RNCM called for transition of care. Client sounded as if she was sleeping and ask what time it is. Client request to be called back after 2pm.  Plan: RNCM will call back later today.  Thea Silversmith, RN, MSN, [place] Coordinator Cell: [phone]

## 2016-07-16 ENCOUNTER — Encounter (HOSPITAL_COMMUNITY): Payer: Self-pay | Admitting: *Deleted

## 2016-07-16 NOTE — Progress Notes (Signed)
Pt was was upset that I was calling to do a SDW-pre-op call . Pt stated " I already got instructions from Dr. Saintclair Halsted." An apology was given and an explaination to make pt aware that her history needed to be updated. Pt stated " I was just there a month ago! " Pt was  informed of her right to refuse and have data collected on DOS. Pt  insisted that I continue with the assessment but would often yell at me when giving responses. Pt denies SOB, chest pain, and being under the care of a cardiologist. Pt made aware to stop taking Aspirin, vitamins, fish oil and herbal medications. Do not take any NSAIDs ie: Ibuprofen, Advil, Naproxen, BC and Goody Powder or any medication containing Aspirin. Pt verbalized understanding of all pre-op instructions. Anesthesia asked to review pt history ( see note).

## 2016-07-16 NOTE — Progress Notes (Signed)
Anesthesia Chart Review:  Pt is a same day work up.   Pt is a 76 year old female scheduled for L4-5 PLIF on 08/13/2016 with Kary Kos, M.D.  [facility] includes: palpitations, HTN, GERD. Alcohol abuse. Former smoker. BMI 33. S/p L hip nail 11/29/15. S/p R TKA 01/02/14.   - Hospitalized 3/31-06/17/16 for fall (found next day by neighbor, she couldn't get up by herself), sepsis due to RLE cellulitis, lactic acidosis, hypokalemia, acute kidney injury.    Medications include: ASA 81 mg, mirapex, spironolactone.  Labs will be obtained DOS.    CXR 06/14/16: Mild enlargement of cardiac silhouette. Bronchitic changes without infiltrate.  EKG 06/14/16: Sinus tachycardia (107 bpm). Multiple premature complexes, vent & supraven. RBBB and LAFB. Baseline wander in lead(s) V6 - this was done in the setting sepsis, hypokalemia.   Echo 02/26/16:  - Left ventricle: The cavity size was normal. Wall thickness was normal. Systolic function was normal. The estimated ejection fraction was in the range of 60% to 65%. Wall motion was normal; there were no regional wall motion abnormalities.  If labs acceptable DOS, I anticipate pt can proceed as scheduled.   Willeen Cass, FNP-BC [facility]/Anesthesiology Phone: [phone] 07/16/2016 1:39 PM

## 2016-07-17 ENCOUNTER — Encounter (HOSPITAL_COMMUNITY): Payer: Self-pay | Admitting: Anesthesiology

## 2016-07-17 ENCOUNTER — Encounter (HOSPITAL_COMMUNITY)
Admission: RE | Disposition: A | Discharge status: Skilled Nursing Facility | Payer: Self-pay | Source: Ambulatory Visit | Attending: Neurosurgery

## 2016-07-17 ENCOUNTER — Inpatient Hospital Stay (HOSPITAL_COMMUNITY)
Admission: RE | Admit: 2016-07-17 | Discharge: 2016-07-21 | DRG: 454 | Disposition: A | Discharge status: Skilled Nursing Facility | Payer: Medicare Other | Source: Ambulatory Visit | Attending: Neurosurgery | Admitting: Neurosurgery

## 2016-07-17 ENCOUNTER — Inpatient Hospital Stay (HOSPITAL_COMMUNITY): Payer: Medicare Other

## 2016-07-17 ENCOUNTER — Inpatient Hospital Stay (HOSPITAL_COMMUNITY): Payer: Medicare Other | Admitting: Emergency Medicine

## 2016-07-17 DIAGNOSIS — F3289 Other specified depressive episodes: Secondary | ICD-10-CM | POA: Diagnosis not present

## 2016-07-17 DIAGNOSIS — Z87440 Personal history of urinary (tract) infections: Secondary | ICD-10-CM

## 2016-07-17 DIAGNOSIS — Z96651 Presence of right artificial knee joint: Secondary | ICD-10-CM | POA: Diagnosis present

## 2016-07-17 DIAGNOSIS — K219 Gastro-esophageal reflux disease without esophagitis: Secondary | ICD-10-CM | POA: Diagnosis present

## 2016-07-17 DIAGNOSIS — E669 Obesity, unspecified: Secondary | ICD-10-CM | POA: Diagnosis present

## 2016-07-17 DIAGNOSIS — G894 Chronic pain syndrome: Secondary | ICD-10-CM | POA: Diagnosis present

## 2016-07-17 DIAGNOSIS — B370 Candidal stomatitis: Secondary | ICD-10-CM | POA: Diagnosis present

## 2016-07-17 DIAGNOSIS — M4310 Spondylolisthesis, site unspecified: Secondary | ICD-10-CM | POA: Diagnosis not present

## 2016-07-17 DIAGNOSIS — M48061 Spinal stenosis, lumbar region without neurogenic claudication: Secondary | ICD-10-CM | POA: Diagnosis present

## 2016-07-17 DIAGNOSIS — G4700 Insomnia, unspecified: Secondary | ICD-10-CM | POA: Diagnosis present

## 2016-07-17 DIAGNOSIS — Z87891 Personal history of nicotine dependence: Secondary | ICD-10-CM

## 2016-07-17 DIAGNOSIS — I10 Essential (primary) hypertension: Secondary | ICD-10-CM | POA: Diagnosis present

## 2016-07-17 DIAGNOSIS — F410 Panic disorder [episodic paroxysmal anxiety] without agoraphobia: Secondary | ICD-10-CM | POA: Diagnosis present

## 2016-07-17 DIAGNOSIS — G2581 Restless legs syndrome: Secondary | ICD-10-CM | POA: Diagnosis present

## 2016-07-17 DIAGNOSIS — M4316 Spondylolisthesis, lumbar region: Secondary | ICD-10-CM | POA: Diagnosis present

## 2016-07-17 DIAGNOSIS — Z6833 Body mass index (BMI) 33.0-33.9, adult: Secondary | ICD-10-CM

## 2016-07-17 DIAGNOSIS — R600 Localized edema: Secondary | ICD-10-CM | POA: Diagnosis not present

## 2016-07-17 DIAGNOSIS — Z09 Encounter for follow-up examination after completed treatment for conditions other than malignant neoplasm: Secondary | ICD-10-CM

## 2016-07-17 DIAGNOSIS — K5900 Constipation, unspecified: Secondary | ICD-10-CM | POA: Diagnosis not present

## 2016-07-17 DIAGNOSIS — M545 Low back pain: Secondary | ICD-10-CM | POA: Diagnosis present

## 2016-07-17 DIAGNOSIS — F32A Depression, unspecified: Secondary | ICD-10-CM | POA: Diagnosis present

## 2016-07-17 DIAGNOSIS — I890 Lymphedema, not elsewhere classified: Secondary | ICD-10-CM | POA: Diagnosis present

## 2016-07-17 DIAGNOSIS — F329 Major depressive disorder, single episode, unspecified: Secondary | ICD-10-CM | POA: Diagnosis present

## 2016-07-17 HISTORY — DX: Nausea with vomiting, unspecified: R11.2

## 2016-07-17 HISTORY — DX: Other specified postprocedural states: Z98.890

## 2016-07-17 HISTORY — PX: LUMBAR FUSION: SHX111

## 2016-07-17 HISTORY — DX: Spondylolisthesis, lumbosacral region: M43.17

## 2016-07-17 LAB — CBC
HEMATOCRIT: 46.4 % — AB (ref 36.0–46.0)
HEMOGLOBIN: 15.6 g/dL — AB (ref 12.0–15.0)
MCH: 30.5 pg (ref 26.0–34.0)
MCHC: 33.6 g/dL (ref 30.0–36.0)
MCV: 90.8 fL (ref 78.0–100.0)
Platelets: 150 10*3/uL (ref 150–400)
RBC: 5.11 MIL/uL (ref 3.87–5.11)
RDW: 12.7 % (ref 11.5–15.5)
WBC: 7.9 10*3/uL (ref 4.0–10.5)

## 2016-07-17 LAB — BASIC METABOLIC PANEL
Anion gap: 10 (ref 5–15)
BUN: 7 mg/dL (ref 6–20)
CALCIUM: 9.1 mg/dL (ref 8.9–10.3)
CHLORIDE: 101 mmol/L (ref 101–111)
CO2: 26 mmol/L (ref 22–32)
CREATININE: 0.9 mg/dL (ref 0.44–1.00)
GFR calc non Af Amer: >60 mL/min (ref >60)
Glucose, Bld: 108 mg/dL — ABNORMAL HIGH (ref 65–99)
Potassium: 3.4 mmol/L — ABNORMAL LOW (ref 3.5–5.1)
Sodium: 137 mmol/L (ref 135–145)

## 2016-07-17 LAB — TYPE AND SCREEN
ABO/RH(D): O NEG
Antibody Screen: NEGATIVE

## 2016-07-17 LAB — SURGICAL PCR SCREEN
MRSA, PCR: NEGATIVE
STAPHYLOCOCCUS AUREUS: NEGATIVE

## 2016-07-17 SURGERY — POSTERIOR LUMBAR FUSION 1 LEVEL
Anesthesia: General | Site: Back

## 2016-07-17 MED ORDER — PROPOFOL 10 MG/ML IV BOLUS
INTRAVENOUS | Status: DC | PRN
  Administered 2016-07-17: 110 mg via INTRAVENOUS

## 2016-07-17 MED ORDER — LIDOCAINE 2% (20 MG/ML) 5 ML SYRINGE
INTRAMUSCULAR | Status: AC
  Filled 2016-07-17: qty 5

## 2016-07-17 MED ORDER — MENTHOL 3 MG MT LOZG: 1.0000 | LOZENGE | OROMUCOSAL | Status: DC | PRN

## 2016-07-17 MED ORDER — VANCOMYCIN HCL 1000 MG IV SOLR
INTRAVENOUS | Status: DC | PRN
Start: 2016-07-17 — End: 2016-07-17
  Administered 2016-07-17: 1000 mg

## 2016-07-17 MED ORDER — ESTRADIOL 0.1 MG/GM VA CREA
1.0000 | TOPICAL_CREAM | Freq: Every evening | VAGINAL | Status: DC | PRN
  Filled 2016-07-17: qty 42.5

## 2016-07-17 MED ORDER — ARTIFICIAL TEARS OPHTHALMIC OINT
TOPICAL_OINTMENT | OPHTHALMIC | Status: DC | PRN
  Administered 2016-07-17: 1 via OPHTHALMIC

## 2016-07-17 MED ORDER — OXYCODONE HCL 5 MG PO TABS
10.0000 mg | ORAL_TABLET | Freq: Four times a day (QID) | ORAL | Status: DC | PRN
  Administered 2016-07-17 – 2016-07-21 (×11): 10 mg via ORAL
  Filled 2016-07-17 (×12): qty 2

## 2016-07-17 MED ORDER — CHLORHEXIDINE GLUCONATE CLOTH 2 % EX PADS: 6.0000 | MEDICATED_PAD | Freq: Once | CUTANEOUS | Status: DC

## 2016-07-17 MED ORDER — SODIUM CHLORIDE 0.9 % IV SOLN
250.0000 mL | INTRAVENOUS | Status: DC
  Administered 2016-07-17: 250 mL via INTRAVENOUS

## 2016-07-17 MED ORDER — HYDROMORPHONE HCL 1 MG/ML IJ SOLN
INTRAMUSCULAR | Status: AC
  Administered 2016-07-17: 16:00:00
  Filled 2016-07-17: qty 0.5

## 2016-07-17 MED ORDER — GABAPENTIN 100 MG PO CAPS
200.0000 mg | ORAL_CAPSULE | Freq: Every day | ORAL | Status: DC
  Administered 2016-07-18 – 2016-07-21 (×4): 200 mg via ORAL
  Filled 2016-07-17 (×4): qty 2

## 2016-07-17 MED ORDER — NYSTATIN 100000 UNIT/ML MT SUSP
5.0000 mL | Freq: Four times a day (QID) | OROMUCOSAL | Status: DC
  Administered 2016-07-18 – 2016-07-21 (×12): 500000 [IU] via ORAL
  Filled 2016-07-17 (×17): qty 5

## 2016-07-17 MED ORDER — SODIUM CHLORIDE 0.9 % IR SOLN
Status: DC | PRN
  Administered 2016-07-17: 11:00:00

## 2016-07-17 MED ORDER — PANTOPRAZOLE SODIUM 40 MG IV SOLR
40.0000 mg | Freq: Every day | INTRAVENOUS | Status: DC
  Administered 2016-07-17: 40 mg via INTRAVENOUS
  Filled 2016-07-17: qty 40

## 2016-07-17 MED ORDER — ACETAMINOPHEN 650 MG RE SUPP: 650.0000 mg | RECTAL | Status: DC | PRN

## 2016-07-17 MED ORDER — PHENYLEPHRINE 40 MCG/ML (10ML) SYRINGE FOR IV PUSH (FOR BLOOD PRESSURE SUPPORT)
PREFILLED_SYRINGE | INTRAVENOUS | Status: AC
  Filled 2016-07-17: qty 10

## 2016-07-17 MED ORDER — LIDOCAINE-EPINEPHRINE 1 %-1:100000 IJ SOLN
INTRAMUSCULAR | Status: DC | PRN
  Administered 2016-07-17: 10 mL

## 2016-07-17 MED ORDER — MIDAZOLAM HCL 5 MG/5ML IJ SOLN
INTRAMUSCULAR | Status: DC | PRN
  Administered 2016-07-17: 2 mg via INTRAVENOUS

## 2016-07-17 MED ORDER — PHENOL 1.4 % MT LIQD: 1.0000 | OROMUCOSAL | Status: DC | PRN

## 2016-07-17 MED ORDER — ONDANSETRON HCL 4 MG/2ML IJ SOLN
4.0000 mg | Freq: Four times a day (QID) | INTRAMUSCULAR | Status: DC | PRN
  Administered 2016-07-17 – 2016-07-18 (×2): 4 mg via INTRAVENOUS
  Filled 2016-07-17 (×2): qty 2

## 2016-07-17 MED ORDER — PROMETHAZINE HCL 25 MG/ML IJ SOLN
INTRAMUSCULAR | Status: AC
Start: 2016-07-17 — End: 2016-07-18
  Filled 2016-07-17: qty 1

## 2016-07-17 MED ORDER — THROMBIN 20000 UNITS EX SOLR
CUTANEOUS | Status: AC
  Filled 2016-07-17: qty 20000

## 2016-07-17 MED ORDER — CEFAZOLIN SODIUM-DEXTROSE 2-4 GM/100ML-% IV SOLN
2.0000 g | Freq: Three times a day (TID) | INTRAVENOUS | Status: AC
  Administered 2016-07-17 – 2016-07-18 (×2): 2 g via INTRAVENOUS
  Filled 2016-07-17 (×3): qty 100

## 2016-07-17 MED ORDER — BUPIVACAINE LIPOSOME 1.3 % IJ SUSP
20.0000 mL | INTRAMUSCULAR | Status: DC
  Filled 2016-07-17: qty 20

## 2016-07-17 MED ORDER — SODIUM CHLORIDE 0.9% FLUSH: 3.0000 mL | INTRAVENOUS | Status: DC | PRN

## 2016-07-17 MED ORDER — FENTANYL CITRATE (PF) 100 MCG/2ML IJ SOLN
INTRAMUSCULAR | Status: DC | PRN
  Administered 2016-07-17: 250 ug via INTRAVENOUS
  Administered 2016-07-17: 25 ug via INTRAVENOUS

## 2016-07-17 MED ORDER — MIDAZOLAM HCL 2 MG/2ML IJ SOLN: 0.5000 mg | Freq: Once | INTRAMUSCULAR | Status: DC | PRN

## 2016-07-17 MED ORDER — BUPIVACAINE HCL (PF) 0.25 % IJ SOLN
INTRAMUSCULAR | Status: AC
  Filled 2016-07-17: qty 30

## 2016-07-17 MED ORDER — FENTANYL CITRATE (PF) 250 MCG/5ML IJ SOLN
INTRAMUSCULAR | Status: AC
  Filled 2016-07-17: qty 5

## 2016-07-17 MED ORDER — MIDAZOLAM HCL 2 MG/2ML IJ SOLN
INTRAMUSCULAR | Status: AC
  Filled 2016-07-17: qty 2

## 2016-07-17 MED ORDER — ACETAMINOPHEN 500 MG PO TABS: 1000.0000 mg | ORAL_TABLET | Freq: Four times a day (QID) | ORAL | Status: DC | PRN

## 2016-07-17 MED ORDER — GABAPENTIN 300 MG PO CAPS
300.0000 mg | ORAL_CAPSULE | Freq: Every day | ORAL | Status: DC
  Administered 2016-07-17 – 2016-07-20 (×4): 300 mg via ORAL
  Filled 2016-07-17 (×4): qty 1

## 2016-07-17 MED ORDER — CITALOPRAM HYDROBROMIDE 20 MG PO TABS
20.0000 mg | ORAL_TABLET | Freq: Every day | ORAL | Status: DC
  Administered 2016-07-17 – 2016-07-20 (×4): 20 mg via ORAL
  Filled 2016-07-17 (×4): qty 1

## 2016-07-17 MED ORDER — HYPROMELLOSE (GONIOSCOPIC) 2.5 % OP SOLN
Freq: Three times a day (TID) | OPHTHALMIC | Status: DC | PRN
  Filled 2016-07-17: qty 15

## 2016-07-17 MED ORDER — ACETAMINOPHEN 325 MG PO TABS: 650.0000 mg | ORAL_TABLET | ORAL | Status: DC | PRN

## 2016-07-17 MED ORDER — MEPERIDINE HCL 25 MG/ML IJ SOLN: 6.2500 mg | INTRAMUSCULAR | Status: DC | PRN

## 2016-07-17 MED ORDER — SENNOSIDES-DOCUSATE SODIUM 8.6-50 MG PO TABS
1.0000 | ORAL_TABLET | Freq: Two times a day (BID) | ORAL | Status: DC
  Administered 2016-07-17 – 2016-07-21 (×8): 1 via ORAL
  Filled 2016-07-17 (×8): qty 1

## 2016-07-17 MED ORDER — MUPIROCIN 2 % EX OINT
1.0000 "application " | TOPICAL_OINTMENT | Freq: Once | CUTANEOUS | Status: AC
  Administered 2016-07-17: 1 via TOPICAL
  Filled 2016-07-17: qty 22

## 2016-07-17 MED ORDER — DEXAMETHASONE SODIUM PHOSPHATE 10 MG/ML IJ SOLN
INTRAMUSCULAR | Status: DC | PRN
  Administered 2016-07-17: 10 mg via INTRAVENOUS

## 2016-07-17 MED ORDER — THROMBIN 5000 UNITS EX SOLR
OROMUCOSAL | Status: DC | PRN
  Administered 2016-07-17: 11:00:00 via TOPICAL

## 2016-07-17 MED ORDER — DOCUSATE SODIUM 100 MG PO CAPS
100.0000 mg | ORAL_CAPSULE | Freq: Two times a day (BID) | ORAL | Status: DC
  Administered 2016-07-17 – 2016-07-21 (×8): 100 mg via ORAL
  Filled 2016-07-17 (×8): qty 1

## 2016-07-17 MED ORDER — LIDOCAINE-EPINEPHRINE 1 %-1:100000 IJ SOLN
INTRAMUSCULAR | Status: AC
  Filled 2016-07-17: qty 1

## 2016-07-17 MED ORDER — SPIRONOLACTONE 25 MG PO TABS
25.0000 mg | ORAL_TABLET | Freq: Every day | ORAL | Status: DC
  Administered 2016-07-18 – 2016-07-21 (×4): 25 mg via ORAL
  Filled 2016-07-17 (×4): qty 1

## 2016-07-17 MED ORDER — SUGAMMADEX SODIUM 200 MG/2ML IV SOLN
INTRAVENOUS | Status: DC | PRN
  Administered 2016-07-17: 200 mg via INTRAVENOUS

## 2016-07-17 MED ORDER — DEXAMETHASONE SODIUM PHOSPHATE 10 MG/ML IJ SOLN
INTRAMUSCULAR | Status: AC
  Filled 2016-07-17: qty 1

## 2016-07-17 MED ORDER — ARTIFICIAL TEARS OPHTHALMIC OINT
TOPICAL_OINTMENT | OPHTHALMIC | Status: AC
  Filled 2016-07-17: qty 3.5

## 2016-07-17 MED ORDER — VANCOMYCIN HCL 1000 MG IV SOLR
INTRAVENOUS | Status: AC
  Filled 2016-07-17: qty 1000

## 2016-07-17 MED ORDER — PROMETHAZINE HCL 25 MG/ML IJ SOLN
6.2500 mg | INTRAMUSCULAR | Status: DC | PRN
  Administered 2016-07-17: 6.25 mg via INTRAVENOUS

## 2016-07-17 MED ORDER — SUCCINYLCHOLINE CHLORIDE 200 MG/10ML IV SOSY
PREFILLED_SYRINGE | INTRAVENOUS | Status: AC
  Filled 2016-07-17: qty 10

## 2016-07-17 MED ORDER — ROCURONIUM BROMIDE 100 MG/10ML IV SOLN
INTRAVENOUS | Status: DC | PRN
  Administered 2016-07-17: 10 mg via INTRAVENOUS
  Administered 2016-07-17: 5 mg via INTRAVENOUS
  Administered 2016-07-17: 10 mg via INTRAVENOUS
  Administered 2016-07-17: 50 mg via INTRAVENOUS
  Administered 2016-07-17: 10 mg via INTRAVENOUS

## 2016-07-17 MED ORDER — HYDROXYZINE HCL 25 MG PO TABS
25.0000 mg | ORAL_TABLET | Freq: Every day | ORAL | Status: DC | PRN
Start: 2016-07-17 — End: 2016-07-21

## 2016-07-17 MED ORDER — TRAZODONE HCL 100 MG PO TABS
100.0000 mg | ORAL_TABLET | Freq: Every day | ORAL | Status: DC | PRN
  Administered 2016-07-21: 100 mg via ORAL
  Filled 2016-07-17 (×2): qty 1

## 2016-07-17 MED ORDER — 0.9 % SODIUM CHLORIDE (POUR BTL) OPTIME
TOPICAL | Status: DC | PRN
  Administered 2016-07-17: 1000 mL

## 2016-07-17 MED ORDER — ALUM & MAG HYDROXIDE-SIMETH 200-200-20 MG/5ML PO SUSP
30.0000 mL | Freq: Four times a day (QID) | ORAL | Status: DC | PRN
  Administered 2016-07-19 (×2): 30 mL via ORAL
  Filled 2016-07-17 (×2): qty 30

## 2016-07-17 MED ORDER — THROMBIN 5000 UNITS EX SOLR
CUTANEOUS | Status: AC
  Filled 2016-07-17: qty 5000

## 2016-07-17 MED ORDER — PHENYLEPHRINE HCL 10 MG/ML IJ SOLN
INTRAVENOUS | Status: DC | PRN
  Administered 2016-07-17: 25 ug/min via INTRAVENOUS

## 2016-07-17 MED ORDER — ONDANSETRON HCL 4 MG PO TABS
4.0000 mg | ORAL_TABLET | Freq: Four times a day (QID) | ORAL | Status: DC | PRN
Start: 2016-07-17 — End: 2016-07-21
  Administered 2016-07-20: 4 mg via ORAL
  Filled 2016-07-17: qty 1

## 2016-07-17 MED ORDER — POLYETHYLENE GLYCOL 3350 17 G PO PACK
17.0000 g | PACK | Freq: Every day | ORAL | Status: DC | PRN
  Administered 2016-07-19 – 2016-07-21 (×3): 17 g via ORAL
  Filled 2016-07-17 (×3): qty 1

## 2016-07-17 MED ORDER — PROPOFOL 10 MG/ML IV BOLUS
INTRAVENOUS | Status: AC
Start: 2016-07-17 — End: 2016-07-17
  Filled 2016-07-17: qty 20

## 2016-07-17 MED ORDER — LORATADINE 10 MG PO TABS
10.0000 mg | ORAL_TABLET | Freq: Every day | ORAL | Status: DC
  Administered 2016-07-18 – 2016-07-21 (×4): 10 mg via ORAL
  Filled 2016-07-17 (×4): qty 1

## 2016-07-17 MED ORDER — EPHEDRINE 5 MG/ML INJ
INTRAVENOUS | Status: AC
  Filled 2016-07-17: qty 10

## 2016-07-17 MED ORDER — ONDANSETRON 4 MG PO TBDP
4.0000 mg | ORAL_TABLET | Freq: Four times a day (QID) | ORAL | Status: DC | PRN
  Administered 2016-07-20 – 2016-07-21 (×2): 4 mg via ORAL
  Filled 2016-07-17 (×2): qty 1

## 2016-07-17 MED ORDER — ASPIRIN EC 81 MG PO TBEC
81.0000 mg | DELAYED_RELEASE_TABLET | Freq: Every day | ORAL | Status: DC
  Administered 2016-07-17 – 2016-07-20 (×4): 81 mg via ORAL
  Filled 2016-07-17 (×4): qty 1

## 2016-07-17 MED ORDER — SUGAMMADEX SODIUM 200 MG/2ML IV SOLN
INTRAVENOUS | Status: AC
  Filled 2016-07-17: qty 2

## 2016-07-17 MED ORDER — ONDANSETRON HCL 4 MG/2ML IJ SOLN
INTRAMUSCULAR | Status: AC
  Filled 2016-07-17: qty 2

## 2016-07-17 MED ORDER — THROMBIN 20000 UNITS EX SOLR
CUTANEOUS | Status: DC | PRN
  Administered 2016-07-17: 11:00:00 via TOPICAL

## 2016-07-17 MED ORDER — HYDROMORPHONE HCL 1 MG/ML IJ SOLN
0.2500 mg | INTRAMUSCULAR | Status: DC | PRN
  Administered 2016-07-17 (×4): 0.25 mg via INTRAVENOUS

## 2016-07-17 MED ORDER — PRAMIPEXOLE DIHYDROCHLORIDE 0.125 MG PO TABS: 0.3750 mg | ORAL_TABLET | Freq: Two times a day (BID) | ORAL | Status: DC | PRN

## 2016-07-17 MED ORDER — ZOLPIDEM TARTRATE 5 MG PO TABS
5.0000 mg | ORAL_TABLET | Freq: Every evening | ORAL | Status: DC | PRN
  Administered 2016-07-18 – 2016-07-19 (×2): 5 mg via ORAL
  Filled 2016-07-17 (×2): qty 1

## 2016-07-17 MED ORDER — CYCLOBENZAPRINE HCL 10 MG PO TABS
10.0000 mg | ORAL_TABLET | Freq: Three times a day (TID) | ORAL | Status: DC | PRN
  Administered 2016-07-17 – 2016-07-19 (×3): 10 mg via ORAL
  Filled 2016-07-17 (×3): qty 1

## 2016-07-17 MED ORDER — LACTATED RINGERS IV SOLN
INTRAVENOUS | Status: DC
  Administered 2016-07-17: 50 mL/h via INTRAVENOUS
  Administered 2016-07-17 (×2): via INTRAVENOUS

## 2016-07-17 MED ORDER — LIDOCAINE HCL (CARDIAC) 20 MG/ML IV SOLN
INTRAVENOUS | Status: DC | PRN
  Administered 2016-07-17: 20 mg via INTRAVENOUS

## 2016-07-17 MED ORDER — HYDROMORPHONE HCL 1 MG/ML IJ SOLN
0.5000 mg | INTRAMUSCULAR | Status: DC | PRN
  Administered 2016-07-17 – 2016-07-21 (×13): 0.5 mg via INTRAVENOUS
  Filled 2016-07-17 (×15): qty 1

## 2016-07-17 MED ORDER — ROCURONIUM BROMIDE 10 MG/ML (PF) SYRINGE
PREFILLED_SYRINGE | INTRAVENOUS | Status: AC
  Filled 2016-07-17: qty 5

## 2016-07-17 MED ORDER — CEFAZOLIN SODIUM-DEXTROSE 2-4 GM/100ML-% IV SOLN
2.0000 g | INTRAVENOUS | Status: AC
  Administered 2016-07-17: 2 g via INTRAVENOUS
  Filled 2016-07-17: qty 100

## 2016-07-17 MED ORDER — SODIUM CHLORIDE 0.9% FLUSH
3.0000 mL | Freq: Two times a day (BID) | INTRAVENOUS | Status: DC
  Administered 2016-07-17 – 2016-07-21 (×7): 3 mL via INTRAVENOUS

## 2016-07-17 MED ORDER — RISAQUAD PO CAPS
2.0000 | ORAL_CAPSULE | Freq: Every day | ORAL | Status: DC
  Administered 2016-07-18 – 2016-07-21 (×4): 2 via ORAL
  Filled 2016-07-17 (×4): qty 2

## 2016-07-17 MED ORDER — BUPIVACAINE LIPOSOME 1.3 % IJ SUSP
INTRAMUSCULAR | Status: DC | PRN
  Administered 2016-07-17: 20 mL

## 2016-07-17 MED ORDER — METHOCARBAMOL 500 MG PO TABS
250.0000 mg | ORAL_TABLET | Freq: Every day | ORAL | Status: DC | PRN
  Administered 2016-07-18 – 2016-07-21 (×4): 250 mg via ORAL
  Filled 2016-07-17 (×4): qty 1

## 2016-07-17 SURGICAL SUPPLY — 69 items
BAG DECANTER FOR FLEXI CONT (MISCELLANEOUS) ×2 IMPLANT
BENZOIN TINCTURE PRP APPL 2/3 (GAUZE/BANDAGES/DRESSINGS) ×2 IMPLANT
BLADE CLIPPER SURG (BLADE) IMPLANT
BLADE SURG 11 STRL SS (BLADE) ×2 IMPLANT
BONE VIVIGEN FORMABLE 5.4CC (Bone Implant) ×2 IMPLANT
BUR CUTTER 7.0 ROUND (BURR) ×2 IMPLANT
BUR MATCHSTICK NEURO 3.0 LAGG (BURR) ×2 IMPLANT
CANISTER SUCT 3000ML PPV (MISCELLANEOUS) ×2 IMPLANT
CAP LOCKING THREADED (Cap) ×8 IMPLANT
CARTRIDGE OIL MAESTRO DRILL (MISCELLANEOUS) ×1 IMPLANT
CONT SPEC 4OZ CLIKSEAL STRL BL (MISCELLANEOUS) ×2 IMPLANT
COVER BACK TABLE 60X90IN (DRAPES) ×2 IMPLANT
DECANTER SPIKE VIAL GLASS SM (MISCELLANEOUS) ×2 IMPLANT
DERMABOND ADVANCED (GAUZE/BANDAGES/DRESSINGS) ×1
DERMABOND ADVANCED .7 DNX12 (GAUZE/BANDAGES/DRESSINGS) ×1 IMPLANT
DIFFUSER DRILL AIR PNEUMATIC (MISCELLANEOUS) ×2 IMPLANT
DRAPE C-ARM 42X72 X-RAY (DRAPES) ×2 IMPLANT
DRAPE C-ARMOR (DRAPES) ×2 IMPLANT
DRAPE HALF SHEET 40X57 (DRAPES) IMPLANT
DRAPE LAPAROTOMY 100X72X124 (DRAPES) ×2 IMPLANT
DRAPE POUCH INSTRU U-SHP 10X18 (DRAPES) ×2 IMPLANT
DRAPE SURG 17X23 STRL (DRAPES) ×2 IMPLANT
DURAPREP 26ML APPLICATOR (WOUND CARE) ×2 IMPLANT
ELECT REM PT RETURN 9FT ADLT (ELECTROSURGICAL) ×2
ELECTRODE REM PT RTRN 9FT ADLT (ELECTROSURGICAL) ×1 IMPLANT
EVACUATOR 3/16  PVC DRAIN (DRAIN)
EVACUATOR 3/16 PVC DRAIN (DRAIN) IMPLANT
GAUZE SPONGE 4X4 12PLY STRL (GAUZE/BANDAGES/DRESSINGS) ×2 IMPLANT
GAUZE SPONGE 4X4 16PLY XRAY LF (GAUZE/BANDAGES/DRESSINGS) ×4 IMPLANT
GLOVE BIO SURGEON STRL SZ7 (GLOVE) IMPLANT
GLOVE BIO SURGEON STRL SZ8 (GLOVE) ×4 IMPLANT
GLOVE BIOGEL PI IND STRL 7.0 (GLOVE) IMPLANT
GLOVE BIOGEL PI INDICATOR 7.0 (GLOVE)
GLOVE ECLIPSE 7.5 STRL STRAW (GLOVE) IMPLANT
GLOVE EXAM NITRILE LRG STRL (GLOVE) IMPLANT
GLOVE EXAM NITRILE XL STR (GLOVE) IMPLANT
GLOVE EXAM NITRILE XS STR PU (GLOVE) IMPLANT
GLOVE INDICATOR 8.5 STRL (GLOVE) ×4 IMPLANT
GOWN STRL REUS W/ TWL LRG LVL3 (GOWN DISPOSABLE) ×1 IMPLANT
GOWN STRL REUS W/ TWL XL LVL3 (GOWN DISPOSABLE) ×4 IMPLANT
GOWN STRL REUS W/TWL 2XL LVL3 (GOWN DISPOSABLE) IMPLANT
GOWN STRL REUS W/TWL LRG LVL3 (GOWN DISPOSABLE) ×1
GOWN STRL REUS W/TWL XL LVL3 (GOWN DISPOSABLE) ×4
HEMOSTAT POWDER KIT SURGIFOAM (HEMOSTASIS) ×2 IMPLANT
KIT BASIN OR (CUSTOM PROCEDURE TRAY) ×2 IMPLANT
KIT INFUSE XX SMALL 0.7CC (Orthopedic Implant) ×2 IMPLANT
KIT ROOM TURNOVER OR (KITS) ×2 IMPLANT
NEEDLE HYPO 25X1 1.5 SAFETY (NEEDLE) ×2 IMPLANT
NS IRRIG 1000ML POUR BTL (IV SOLUTION) ×2 IMPLANT
OIL CARTRIDGE MAESTRO DRILL (MISCELLANEOUS) ×2
PACK LAMINECTOMY NEURO (CUSTOM PROCEDURE TRAY) ×2 IMPLANT
PAD ARMBOARD 7.5X6 YLW CONV (MISCELLANEOUS) ×6 IMPLANT
ROD CREO 45MM SPINAL (Rod) ×4 IMPLANT
SCREW AMP MODULAR CREO 6.5X45 (Screw) ×4 IMPLANT
SCREW CREO 6.5X40 (Screw) ×4 IMPLANT
SCREW PA THRD CREO TULIP 5.5X4 (Head) ×8 IMPLANT
SPACER SUSTAIN O SML 10X22 11M (Peek) ×4 IMPLANT
SPONGE LAP 4X18 X RAY DECT (DISPOSABLE) IMPLANT
SPONGE SURGIFOAM ABS GEL 100 (HEMOSTASIS) ×2 IMPLANT
STRIP CLOSURE SKIN 1/2X4 (GAUZE/BANDAGES/DRESSINGS) ×2 IMPLANT
SUT VIC AB 0 CT1 18XCR BRD8 (SUTURE) ×2 IMPLANT
SUT VIC AB 0 CT1 8-18 (SUTURE) ×2
SUT VIC AB 2-0 CT1 18 (SUTURE) ×4 IMPLANT
SUT VIC AB 4-0 PS2 27 (SUTURE) ×2 IMPLANT
SYRINGE 20CC LL (MISCELLANEOUS) ×2 IMPLANT
TOWEL GREEN STERILE (TOWEL DISPOSABLE) ×2 IMPLANT
TOWEL GREEN STERILE FF (TOWEL DISPOSABLE) ×2 IMPLANT
TRAY FOLEY W/METER SILVER 16FR (SET/KITS/TRAYS/PACK) ×2 IMPLANT
WATER STERILE IRR 1000ML POUR (IV SOLUTION) ×2 IMPLANT

## 2016-07-17 NOTE — Anesthesia Preprocedure Evaluation (Addendum)
Anesthesia Evaluation  Patient identified by MRN, date of birth, ID band Patient awake    Reviewed: Allergy & Precautions, NPO status , Patient's Chart, lab work & pertinent test results  History of Anesthesia Complications (+) PONV  Airway Mallampati: II  TM Distance: <3 FB Neck ROM: Full    Dental  (+) Dental Advisory Given, Teeth Intact   Pulmonary COPD, former smoker,    breath sounds clear to auscultation       Cardiovascular hypertension, Pt. on medications (-) angina Rhythm:Regular Rate:Normal  '17 ECHO: EF 60-65%   Neuro/Psych Anxiety Depression negative neurological ROS     GI/Hepatic Neg liver ROS, GERD  Controlled,  Endo/Other  Morbid obesity  Renal/GU negative Renal ROS     Musculoskeletal  (+) Arthritis ,   Abdominal (+) + obese,   Peds  Hematology negative hematology ROS (+)   Anesthesia Other Findings   Reproductive/Obstetrics                           Anesthesia Physical Anesthesia Plan  ASA: II  Anesthesia Plan: General   Post-op Pain Management:    Induction: Intravenous  Airway Management Planned: Oral ETT  Additional Equipment:   Intra-op Plan:   Post-operative Plan: Extubation in OR  Informed Consent: I have reviewed the patients History and Physical, chart, labs and discussed the procedure including the risks, benefits and alternatives for the proposed anesthesia with the patient or authorized representative who has indicated his/her understanding and acceptance.   Dental advisory given  Plan Discussed with: CRNA and Surgeon  Anesthesia Plan Comments: (Plan routine monitors, GETA)        Anesthesia Quick Evaluation

## 2016-07-17 NOTE — Anesthesia Procedure Notes (Signed)
Procedure Name: Intubation Date/Time: 07/17/2016 11:05 AM Performed by: Suzy Bouchard Pre-anesthesia Checklist: Patient identified, Emergency Drugs available, Timeout performed, Patient being monitored and Suction available Patient Re-evaluated:Patient Re-evaluated prior to inductionOxygen Delivery Method: Circle system utilized Preoxygenation: Pre-oxygenation with 100% oxygen Intubation Type: IV induction Ventilation: Mask ventilation without difficulty Laryngoscope Size: Miller, 2, Mac and 3 Grade View: Grade III Tube type: Oral Tube size: 7.0 mm Number of attempts: 1 Airway Equipment and Method: Stylet Placement Confirmation: ETT inserted through vocal cords under direct vision,  positive ETCO2 and breath sounds checked- equal and bilateral Secured at: 22 cm Tube secured with: Tape Dental Injury: Teeth and Oropharynx as per pre-operative assessment  Comments: DL x1 w Sabra Heck 2 by Amie Portland CRNA, gr 2 view, unable to easily pass ETT through cords.  DL x2 by Jenita Seashore MD with Mac 3, Gr 3 view, ETT passed through cords.  Teeth unchanged.

## 2016-07-17 NOTE — Consult Note (Signed)
Consultation Note   Denise Kelly:[id number] DOB: 1940/07/25 DOA: 07/17/2016   PCP: Jonathon Bellows, MD   Requesting physician: Dr. Saintclair Halsted  Reason for consultation: Assist with management of multiple medical problems  HPI: Denise Kelly is a 76 y.o. female with medical history significant for recurrent UTI, chronic lower extremity edema with chronic stasis dermatitis, restless leg syndrome, obesity, hypertension, chronic pain syndrome, and prior history of alcohol abuse. Patient was recently discharged on 4/3 after an admission for sepsis secondary to right lower extremity cellulitis. She had been treated with antibiotics prior to admission and was subsequent started on doxycycline. After discharge she followed up with infectious disease service who felt her symptoms were more reflective of stasis dermatitis/complications related to inadequately treated edema so her antibiotics were discontinued and aggressive diuresis was recommended. Patient is now on Aldactone. Patient has been having significant problems with low back pain secondary to spinal stenosis and underwent surgical procedure today for correction by the neurosurgeon. We have been asked to assist postoperatively with patient's medical problems.  Review of Systems:  In addition to the HPI above,  No Fever-chills, myalgias or other constitutional symptoms No Headache, changes with Vision or hearing, new weakness, tingling, numbness in any extremity, dizziness, dysarthria or word finding difficulty, gait disturbance or imbalance, tremors or seizure activity No problems swallowing food or Liquids, indigestion/reflux, choking or coughing while eating, abdominal pain with or after eating No Chest pain, Cough or Shortness of Breath, palpitations, orthopnea or DOE No Abdominal pain, N/V, melena,hematochezia, dark tarry stools, constipation No dysuria, malodorous urine, hematuria or flank pain No new skin rashes, lesions, masses or  bruises, No swelling or redness No recent unintentional weight gain or loss No polyuria, polydypsia or polyphagia   Past Medical History:  Diagnosis Date  . Alcohol abuse    ETOH and xanax  . Anxiety    Panic attacks   . Arthritis   . Cancer ([place])    skin- basal , squamous , melonoma- right hand  . Cellulitis    right leg  . Complication of anesthesia 2009   "felt drunk for a week after"- AVM- both times- felt drunk  . Depression   . Encephalopathy   . GERD (gastroesophageal reflux disease)   . HOH (hard of hearing)   . HOH (hard of hearing)   . Hypertension    not on mediacations  . Palpitations   . PONV (postoperative nausea and vomiting)   . Spondylolisthesis of lumbosacral region   . UTI (urinary tract infection)    frequently    Past Surgical History:  Procedure Laterality Date  . BREAST SURGERY Right    2 breast biopsies  . 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 COLONOSCOPY  2011   polyps  . EYE SURGERY Bilateral    cataracts  . INTRAMEDULLARY (IM) NAIL INTERTROCHANTERIC Left 11/29/2015   Procedure: LEFT HIP   NAIL;  Surgeon: Renette Butters, MD;  Location: [facility];  Service: Orthopedics;  Laterality: Left;  . JOINT REPLACEMENT Left 09/2009   knee  . TONSILLECTOMY    . TOTAL KNEE ARTHROPLASTY Right 01/02/2014   Procedure: TOTAL KNEE ARTHROPLASTY;  Surgeon: [name], MD;  Location: [facility];  Service: Orthopedics;  Laterality: Right;  . TUBAL LIGATION      Social History   Social History  . Marital status: Single    Spouse name: N/A  . Number of children: N/A  . Years of  education: N/A   Occupational History  . Not on file.   Social History Main Topics  . Smoking status: Former Smoker    Years: 20.00  . Smokeless tobacco: Never Used     Comment: quit smoking many years ago .  quit 1994  . Alcohol use No     Comment: unable to get alcohol per family not since September  . Drug use: No  . Sexual activity: No   Other Topics Concern   . Not on file   Social History Narrative  . No narrative on file     Allergies  Allergen Reactions  . Atenolol Shortness Of Breath  . Propranolol Shortness Of Breath  . Sulfa Antibiotics Hives, Swelling and Other (See Comments)    Reaction:  Facial/eye swelling   . Coreg [Carvedilol] Other (See Comments)    Memory loss  . Motrin [Ibuprofen] Palpitations  . Toprol Xl [Metoprolol Tartrate] Cough  . Bactrim [Sulfamethoxazole-Trimethoprim] Swelling    SWELLING REACTION UNSPECIFIED   . Ciprofloxacin Itching  . Doxycycline Hyclate Other (See Comments)    HEARTBURN  . Flovent Hfa [Fluticasone] Itching  . Statins Other (See Comments)    MENTAL STATUS CHANGE    Family History  Problem Relation Age of Onset  . Hypertension Other   . Cancer Mother   . Cancer Father      Prior to Admission medications   Medication Sig Start Date End Date Taking? Authorizing Provider  acetaminophen (TYLENOL) 500 MG tablet Take 1,000 mg by mouth every 6 (six) hours as needed for mild pain, moderate pain or headache.    Yes Historical Provider, MD  acidophilus (RISAQUAD) CAPS capsule Take 2 capsules by mouth daily. 05/29/16  Yes Albertine Patricia, MD  aspirin EC 81 MG tablet Take 81 mg by mouth at bedtime.    Yes Historical Provider, MD  cephALEXin (KEFLEX) 500 MG capsule TAKE (1) CAPSULE TWICE DAILY. 07/09/16  Yes Campbell Riches, MD  citalopram (CELEXA) 20 MG tablet Take 1 tablet (20 mg total) by mouth at bedtime. 02/24/14  Yes [name], MD  gabapentin (NEURONTIN) 100 MG capsule Take 200-300 mg by mouth 2 (two) times daily. 200 mg in the morning & 300 mg at bedtime   Yes Historical Provider, MD  hydrOXYzine (ATARAX/VISTARIL) 25 MG tablet Take 25 mg by mouth daily as needed for anxiety (and/or sleep).    Yes Historical Provider, MD  loratadine (CLARITIN) 10 MG tablet Take 10 mg by mouth 2 (two) times daily.    Yes Historical Provider, MD  methocarbamol (ROBAXIN) 500 MG tablet Take 250 mg by mouth  daily as needed (for leg cramps/spasms.).    Yes Historical Provider, MD  ondansetron (ZOFRAN-ODT) 4 MG disintegrating tablet Take 4 mg by mouth every 6 (six) hours as needed for nausea or vomiting.    Yes Historical Provider, MD  oxyCODONE (OXY IR/ROXICODONE) 5 MG immediate release tablet Take 1 tablet (5 mg total) by mouth every 6 (six) hours as needed for severe pain. Patient taking differently: Take 10 mg by mouth every 6 (six) hours as needed for severe pain.  05/29/16  Yes Albertine Patricia, MD  Polyethyl Glycol-Propyl Glycol (SYSTANE OP) Apply 1-2 drops to eye 3 (three) times daily as needed (for dry eyes).   Yes Historical Provider, MD  pramipexole (MIRAPEX) 0.125 MG tablet Take 0.375 mg by mouth 2 (two) times daily as needed (for restless leg syndrome).    Yes Historical Provider, MD  senna-docusate [facility]  S) 8.6-50 MG tablet Take 1 tablet by mouth 2 (two) times daily. 02/28/16  Yes Eugenie Filler, MD  spironolactone (ALDACTONE) 25 MG tablet Take 25 mg by mouth daily.  06/23/16  Yes Historical Provider, MD  traZODone (DESYREL) 50 MG tablet Take 100 mg by mouth daily as needed for sleep. 06/17/16  Yes Historical Provider, MD  zolpidem (AMBIEN) 10 MG tablet Take 5 mg by mouth at bedtime as needed for sleep.   Yes Historical Provider, MD  estradiol (ESTRACE) 0.1 MG/GM vaginal cream Place 1 Applicatorful vaginally at bedtime as needed (for vaginal discomfort).    Historical Provider, MD  folic acid (FOLVITE) 1 MG tablet Take 1 tablet (1 mg total) by mouth daily. Patient not taking: Reported on 07/04/2016 02/29/16   Eugenie Filler, MD  furosemide (LASIX) 20 MG tablet Take 1 tablet (20 mg total) by mouth daily. Patient not taking: Reported on 07/14/2016 05/30/16   Silver Huguenin Elgergawy, MD  potassium chloride (K-DUR) 10 MEQ tablet Take 1 tablet (10 mEq total) by mouth daily. Patient not taking: Reported on 07/14/2016 05/29/16   Albertine Patricia, MD  thiamine 100 MG tablet Take 1 tablet (100 mg  total) by mouth daily. Patient not taking: Reported on 07/04/2016 02/29/16   Eugenie Filler, MD  traMADol (ULTRAM) 50 MG tablet Take 1 tablet (50 mg total) by mouth every 6 (six) hours as needed for moderate pain. Patient not taking: Reported on 07/04/2016 02/28/16   Eugenie Filler, MD    Physical Exam: Vitals:   07/17/16 1508 07/17/16 1515 07/17/16 1523 07/17/16 1530  BP: 124/64  (!) 113/56   Pulse: 92 91 88 90  Resp: (!) 21 (!) 25 (!) 22 (!) 23  Temp:      TempSrc:      SpO2: 93% 93% 94% 95%  Weight:          Constitutional: Examined in PACU -NAD, Anxious, uncomfortable Eyes: PERRL, lids and conjunctivae normal ENMT: Mucous membranes are very dry and reddened. Posterior pharynx clear of any exudate or lesions. Neck: normal, supple, no masses, no thyromegaly Respiratory: clear to auscultation bilaterally, no wheezing, no crackles. Normal respiratory effort. No accessory muscle use.  Cardiovascular: Regular rate and rhythm, no murmurs / rubs / gallops. No extremity edema. 2+ pedal pulses. No carotid bruits.  Abdomen: no tenderness, no masses palpated. No hepatosplenomegaly. Bowel sounds positive.  Musculoskeletal: no clubbing / cyanosis. No joint deformity upper and lower extremities. Good ROM, no contractures. Normal muscle tone. Compressed Hemovac in place with bloody drainage. Skin: no rashes, lesions, ulcers. No induration Neurologic: CN 2-12 grossly intact. Sensation intact, Strength 4/5 x all 4 extremities although exam limited by postoperative status/recent anesthetic medications.  Psychiatric: Normal judgment and insight. Alert and oriented x 3. Normal mood.    Labs on Admission: I have personally reviewed following labs and imaging studies  CBC:  Recent Labs Lab 07/17/16 0848  WBC 7.9  HGB 15.6*  HCT 46.4*  MCV 90.8  PLT 093   Basic Metabolic Panel:  Recent Labs Lab 07/17/16 0848  NA 137  K 3.4*  CL 101  CO2 26  GLUCOSE 108*  BUN 7  CREATININE  0.90  CALCIUM 9.1   GFR: Estimated Creatinine Clearance: 66.6 mL/min (by C-G formula based on SCr of 0.9 mg/dL). Liver Function Tests: No results for input(s): AST, ALT, ALKPHOS, BILITOT, PROT, ALBUMIN in the last 168 hours. No results for input(s): LIPASE, AMYLASE in the last 168 hours. No  results for input(s): AMMONIA in the last 168 hours. Coagulation Profile: No results for input(s): INR, PROTIME in the last 168 hours. Cardiac Enzymes: No results for input(s): CKTOTAL, CKMB, CKMBINDEX, TROPONINI in the last 168 hours. BNP (last 3 results) No results for input(s): PROBNP in the last 8760 hours. HbA1C: No results for input(s): HGBA1C in the last 72 hours. CBG: No results for input(s): GLUCAP in the last 168 hours. Lipid Profile: No results for input(s): CHOL, HDL, LDLCALC, TRIG, CHOLHDL, LDLDIRECT in the last 72 hours. Thyroid Function Tests: No results for input(s): TSH, T4TOTAL, FREET4, T3FREE, THYROIDAB in the last 72 hours. Anemia Panel: No results for input(s): VITAMINB12, FOLATE, FERRITIN, TIBC, IRON, RETICCTPCT in the last 72 hours. Urine analysis:    Component Value Date/Time   COLORURINE YELLOW 06/14/2016 1628   APPEARANCEUR CLEAR 06/14/2016 1628   LABSPEC 1.010 06/14/2016 1628   PHURINE 6.0 06/14/2016 1628   GLUCOSEU NEGATIVE 06/14/2016 1628   HGBUR MODERATE (A) 06/14/2016 1628   BILIRUBINUR NEGATIVE 06/14/2016 1628   KETONESUR NEGATIVE 06/14/2016 1628   PROTEINUR NEGATIVE 06/14/2016 1628   UROBILINOGEN 1.0 11/30/2014 2105   NITRITE NEGATIVE 06/14/2016 1628   LEUKOCYTESUR NEGATIVE 06/14/2016 1628   Sepsis Labs: @LABRCNTIP (procalcitonin:4,lacticidven:4) ) Recent Results (from the past 240 hour(s))  Surgical pcr screen     Status: None   Collection Time: 07/17/16  8:48 AM  Result Value Ref Range Status   MRSA, PCR NEGATIVE NEGATIVE Final   Staphylococcus aureus NEGATIVE NEGATIVE Final    Comment:        The Xpert SA Assay (FDA approved for NASAL  specimens in patients over 19 years of age), is one component of a comprehensive surveillance program.  Test performance has been validated by [facility] for patients greater than or equal to 67 year old. It is not intended to diagnose infection nor to guide or monitor treatment.      Radiological Exams on Admission: Dg Lumbar Spine 2-3 Views  Result Date: 07/17/2016 CLINICAL DATA:  L4-5 PLIF. EXAM: LUMBAR SPINE - 2-3 VIEW; DG C-ARM 61-120 MIN FLUOROSCOPY TIME:  37 seconds COMPARISON:  Lumbar spine radiographs June 14, 2016 FINDINGS: Two intraoperative fluoroscopic spot views submitted, interpreting radiologist was not present at time of procedure. Bilateral L4 and L5 pedicle screws with L4-5 peek cage. IMPRESSION: Intraoperative L4-5 PLIF. Electronically Signed   By: Elon Alas M.D.   On: 07/17/2016 14:12   Dg C-arm 1-60 Min  Result Date: 07/17/2016 CLINICAL DATA:  L4-5 PLIF. EXAM: LUMBAR SPINE - 2-3 VIEW; DG C-ARM 61-120 MIN FLUOROSCOPY TIME:  37 seconds COMPARISON:  Lumbar spine radiographs June 14, 2016 FINDINGS: Two intraoperative fluoroscopic spot views submitted, interpreting radiologist was not present at time of procedure. Bilateral L4 and L5 pedicle screws with L4-5 peek cage. IMPRESSION: Intraoperative L4-5 PLIF. Electronically Signed   By: Elon Alas M.D.   On: 07/17/2016 14:12     Assessment/Plan Principal Problem:   Spondylolisthesis -Status post decompressive lumbar laminectomy today (5/3) -Management at discretion of neurosurgical team -Has associated chronic pain syndrome therefore recommend continue preoperative Neurontin, Robaxin-narcotics at discretion of neurosurgical team  Active Problems:   Essential hypertension, benign -Current blood pressure controlled -Resume diuretic in a.m.    Bilateral lower extremity edema -Normal echocardiogram in December 2017 without evidence of systolic or diastolic heart failure -Likely obesity and dysmotility  related edema with chronic skin changes consistent with stasis dermatitis -Evaluated by infectious disease/patch or last month who recommended no further antibiotics and focus  on aggressive management of edema with impressive stockings and diuretics -Resume Aldactone in a.m.    Oral candida -Patient has had recent antibiotics and oral exam consistent with likely candida -Begin nystatin swish and swallow    Depression -Continue Celexa, trazodone and Ambien (to help with insomnia symptoms)      DVT prophylaxis: At discretion of primary team       ELLIS,ALLISON L. ANP-BC [facility] Pager [phone]   If 7PM-7AM, please contact night-coverage [email or web address] Password [email or web address]  07/17/2016, 3:36 PM

## 2016-07-17 NOTE — Anesthesia Postprocedure Evaluation (Addendum)
Anesthesia Post Note  Patient: [name]  Procedure(s) Performed: Procedure(s) (LRB): Lumbar four-five Posterior lumbar interbody fusion (N/A)  Patient location during evaluation: PACU Anesthesia Type: General Level of consciousness: awake and alert Pain management: pain level controlled Vital Signs Assessment: post-procedure vital signs reviewed and stable Respiratory status: spontaneous breathing, nonlabored ventilation, respiratory function stable and patient connected to nasal cannula oxygen Cardiovascular status: blood pressure returned to baseline and stable Postop Assessment: no signs of nausea or vomiting Anesthetic complications: no       Last Vitals:  Vitals:   07/17/16 1553 07/17/16 1554  BP: 114/70   Pulse: 90   Resp: 12   Temp:  36.7 C    Last Pain:  Vitals:   07/17/16 1515  TempSrc:   PainSc: Asleep                 Erlinda Solinger,E. Ronan Dion

## 2016-07-17 NOTE — Transfer of Care (Signed)
Immediate Anesthesia Transfer of Care Note  Patient: Denise Kelly  Procedure(s) Performed: Procedure(s): Lumbar four-five Posterior lumbar interbody fusion (N/A)  Patient Location: PACU  Anesthesia Type:General  Level of Consciousness: awake  Airway & Oxygen Therapy: Patient Spontanous Breathing and Patient connected to nasal cannula oxygen  Post-op Assessment: Report given to RN, Post -op Vital signs reviewed and stable and Patient moving all extremities X 4  Post vital signs: Reviewed and stable  Last Vitals:  Vitals:   07/17/16 0853 07/17/16 1438  BP: (!) 141/84   Pulse: 72   Resp: 18   Temp: 36.7 C 36.6 C    Last Pain:  Vitals:   07/17/16 0853  TempSrc: Oral  PainSc:       Patients Stated Pain Goal: 3 (81/10/31 5945)  Complications: No apparent anesthesia complications

## 2016-07-17 NOTE — Op Note (Signed)
Preoperative diagnosis: Grade 1 spondylolisthesis L4-5 with bilateral L4 and L5 radiculopathies  Postoperative diagnosis: Same  Procedure: #1 decompressive lumbar laminectomy in excess and requiring more work than would be needed with a standard interbody fusion with complete medial facetectomies radical foraminotomies of the L4 and L5 nerve roots bilaterally  #2 posterior lumbar interbody fusion using the globus sustained peek cages packed with locally harvested autograft mixed with vivgen and BMP  #3 pedicle suture fixation L4-5 using the globus creole Modular pedicle screw set  #4 posterior lateral arthrodesis L4-5 using locally harvested autograft mix  #5 open reduction spinal deformity  Surgeon: Dominica Severin Kortney Potvin  Asst.: Sherley Bounds  Anesthesia: Gen.  EBL: The  History of present illness: Patient is a very pleasant 76 year old female is a long-standing back and bilateral leg pain refractory to all forms of surgery workup revealed grade 1 spinal listhesis with severe spinal stenosis and marked thecal sac compression. Due to patient's failed conservative treatment imaging findings and progressive clinical syndrome I recommended decompressive laminectomy and fusion at L4-5. I extensively reviewed the risks and benefits of the operation with the patient as well as perioperative course expectations of outcome and alternatives surgery and she understood and agreed to proceed forward.  Operative procedure: Patient brought into the or was induced under general anesthesia positioned prone the Danley Danker frame her back was prepped and draped in routine sterile fashion preoperative x-ray localize the appropriate level so after infiltration of 10 mL lidocaine with epi midline incision made and Bovie left car was used to calcification subperiosteal dissections care lamina of L4 and L5 confirmed by interoperative x-ray. There was marked hypertrophied facet complexes these were drilled down there were noted  be markedly diastased. I removed the spinous process at L4 drill through the lamina identified ligament flavum performed a complete central decompression. There is severe hourglass compression of thecal sac with marked stenosis causing severe foraminal stenosis bilaterally I completely unroofed and did complete medial facetectomies with aggressively under biting the superior tickling facet on both sides to gain access to the lateral margins disc space. Both L4 nerve roots were decompressed and skeletonized flush with pedicle as well as the L5 nerve roots. The epidural veins are graded the spaces cleanout radically from both sides and utilizing sequential distraction I reduced the spondylolisthesis. Then with an 11 distractor placed a selected to 11 peek spacers packed in with the local autograft mix and inserted one packed aggressive amount of BMP and autograft centrally after adequate preparation of the endplates and inserted the contralateral cage. Then utilizing fluoroscopy the step along the way for cage insertion as well as the pedicle screw placement I placed 4 pedicle screws all screws excellent purchase and posterior fluoroscopy confirmed good position. Then the wound scope see her good fixing space was maintained aggressive decortication was care MTPs or lateral gutters remainder the local autograft mixed with packed posterior laterally. I assembled the heads placed 245 mm rods anchored having placed tightening down explored the foramina to confirm no migration of graft material overlaid Gelfoam and top of the dura and placed a Hemovac drain speckled vancomycin powder injected X Burrell the fascia and clonidine closed the wound in layers with interrupted Vicryls and a running 4 subcuticular Dermabond benzo and Steri-Strips and sterile dressings applied patient recovered in stable condition. At the end of case all needle counts and sponge counts were correct.

## 2016-07-17 NOTE — Transfer of Care (Deleted)
Immediate Anesthesia Transfer of Care Note  Patient: Denise Kelly  Procedure(s) Performed: Procedure(s): Lumbar four-five Posterior lumbar interbody fusion (N/A)  Patient Location: PACU  Anesthesia Type:General  Level of Consciousness: awake  Airway & Oxygen Therapy: Patient Spontanous Breathing and Patient connected to nasal cannula oxygen  Post-op Assessment: Report given to RN and Post -op Vital signs reviewed and stable  Post vital signs: Reviewed and stable  Last Vitals:  Vitals:   07/17/16 0853 07/17/16 1438  BP: (!) 141/84   Pulse: 72   Resp: 18   Temp: 36.7 C 36.6 C    Last Pain:  Vitals:   07/17/16 0853  TempSrc: Oral  PainSc:       Patients Stated Pain Goal: 3 (46/27/03 5009)  Complications: No apparent anesthesia complications

## 2016-07-17 NOTE — H&P (Signed)
Denise Kelly is an 76 y.o. female.   Chief Complaint: Back and leg pain HPI: 76 year old female is a long-standing back and bilateral leg pain difficulty walking and progressive neurogenic claudication. Workup revealed severe spinal stenosis and a grade 1 spondylolisthesis at L4-5. Due to her failure of conservative treatment imaging findings and progression of clinical syndrome I recommended decompression stabilization procedure at L4-5. I extensively reviewed the risks and benefits of the operation with her as well as perioperative course expectations of outcome alternatives of surgery she understands and agrees to proceed forward.  Past Medical History:  Diagnosis Date  . Alcohol abuse    ETOH and xanax  . Anxiety    Panic attacks   . Arthritis   . Cancer ([place])    skin- basal , squamous , melonoma- right hand  . Cellulitis    right leg  . Complication of anesthesia 2009   "felt drunk for a week after"- AVM- both times- felt drunk  . Depression   . Encephalopathy   . GERD (gastroesophageal reflux disease)   . HOH (hard of hearing)   . HOH (hard of hearing)   . Hypertension    not on mediacations  . Palpitations   . PONV (postoperative nausea and vomiting)   . Spondylolisthesis of lumbosacral region   . UTI (urinary tract infection)    frequently    Past Surgical History:  Procedure Laterality Date  . BREAST SURGERY Right    2 breast biopsies  . 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 COLONOSCOPY  2011   polyps  . EYE SURGERY Bilateral    cataracts  . INTRAMEDULLARY (IM) NAIL INTERTROCHANTERIC Left 11/29/2015   Procedure: LEFT HIP   NAIL;  Surgeon: Renette Butters, MD;  Location: [facility];  Service: Orthopedics;  Laterality: Left;  . JOINT REPLACEMENT Left 09/2009   knee  . TONSILLECTOMY    . TOTAL KNEE ARTHROPLASTY Right 01/02/2014   Procedure: TOTAL KNEE ARTHROPLASTY;  Surgeon: [name], MD;  Location: [place];  Service: Orthopedics;  Laterality: Right;   . TUBAL LIGATION      Family History  Problem Relation Age of Onset  . Hypertension Other   . Cancer Mother   . Cancer Father    Social History:  reports that she has quit smoking. She quit after 20.00 years of use. She has never used smokeless tobacco. She reports that she does not drink alcohol or use drugs.  Allergies:  Allergies  Allergen Reactions  . Atenolol Shortness Of Breath  . Propranolol Shortness Of Breath  . Sulfa Antibiotics Hives, Swelling and Other (See Comments)    Reaction:  Facial/eye swelling   . Coreg [Carvedilol] Other (See Comments)    Memory loss  . Motrin [Ibuprofen] Palpitations  . Toprol Xl [Metoprolol Tartrate] Cough  . Bactrim [Sulfamethoxazole-Trimethoprim] Swelling    SWELLING REACTION UNSPECIFIED   . Ciprofloxacin Itching  . Doxycycline Hyclate Other (See Comments)    HEARTBURN  . Flovent Hfa [Fluticasone] Itching  . Statins Other (See Comments)    MENTAL STATUS CHANGE    Medications Prior to Admission  Medication Sig Dispense Refill  . acetaminophen (TYLENOL) 500 MG tablet Take 1,000 mg by mouth every 6 (six) hours as needed for mild pain, moderate pain or headache.     Marland Kitchen acidophilus (RISAQUAD) CAPS capsule Take 2 capsules by mouth daily. 30 capsule 0  . aspirin EC 81 MG tablet Take 81 mg by mouth  at bedtime.     . cephALEXin (KEFLEX) 500 MG capsule TAKE (1) CAPSULE TWICE DAILY. 60 capsule 1  . citalopram (CELEXA) 20 MG tablet Take 1 tablet (20 mg total) by mouth at bedtime. 30 tablet 1  . gabapentin (NEURONTIN) 100 MG capsule Take 200-300 mg by mouth 2 (two) times daily. 200 mg in the morning & 300 mg at bedtime    . hydrOXYzine (ATARAX/VISTARIL) 25 MG tablet Take 25 mg by mouth daily as needed for anxiety (and/or sleep).     . loratadine (CLARITIN) 10 MG tablet Take 10 mg by mouth 2 (two) times daily.     . methocarbamol (ROBAXIN) 500 MG tablet Take 250 mg by mouth daily as needed (for leg cramps/spasms.).     Marland Kitchen ondansetron (ZOFRAN-ODT)  4 MG disintegrating tablet Take 4 mg by mouth every 6 (six) hours as needed for nausea or vomiting.     Marland Kitchen oxyCODONE (OXY IR/ROXICODONE) 5 MG immediate release tablet Take 1 tablet (5 mg total) by mouth every 6 (six) hours as needed for severe pain. (Patient taking differently: Take 10 mg by mouth every 6 (six) hours as needed for severe pain. ) 15 tablet 0  . Polyethyl Glycol-Propyl Glycol (SYSTANE OP) Apply 1-2 drops to eye 3 (three) times daily as needed (for dry eyes).    . pramipexole (MIRAPEX) 0.125 MG tablet Take 0.375 mg by mouth 2 (two) times daily as needed (for restless leg syndrome).     Marland Kitchen senna-docusate (SENOKOT S) 8.6-50 MG tablet Take 1 tablet by mouth 2 (two) times daily.    Marland Kitchen spironolactone (ALDACTONE) 25 MG tablet Take 25 mg by mouth daily.     . traZODone (DESYREL) 50 MG tablet Take 100 mg by mouth daily as needed for sleep.    Marland Kitchen zolpidem (AMBIEN) 10 MG tablet Take 5 mg by mouth at bedtime as needed for sleep.    Marland Kitchen estradiol (ESTRACE) 0.1 MG/GM vaginal cream Place 1 Applicatorful vaginally at bedtime as needed (for vaginal discomfort).    . folic acid (FOLVITE) 1 MG tablet Take 1 tablet (1 mg total) by mouth daily. (Patient not taking: Reported on 07/04/2016)    . furosemide (LASIX) 20 MG tablet Take 1 tablet (20 mg total) by mouth daily. (Patient not taking: Reported on 07/14/2016) 30 tablet 0  . potassium chloride (K-DUR) 10 MEQ tablet Take 1 tablet (10 mEq total) by mouth daily. (Patient not taking: Reported on 07/14/2016) 30 tablet 0  . thiamine 100 MG tablet Take 1 tablet (100 mg total) by mouth daily. (Patient not taking: Reported on 07/04/2016)    . traMADol (ULTRAM) 50 MG tablet Take 1 tablet (50 mg total) by mouth every 6 (six) hours as needed for moderate pain. (Patient not taking: Reported on 07/04/2016) 20 tablet 0    Results for orders placed or performed during the hospital encounter of 07/17/16 (from the past 48 hour(s))  CBC     Status: Abnormal   Collection Time:  07/17/16  8:48 AM  Result Value Ref Range   WBC 7.9 4.0 - 10.5 K/uL   RBC 5.11 3.87 - 5.11 MIL/uL   Hemoglobin 15.6 (H) 12.0 - 15.0 g/dL   HCT 46.4 (H) 36.0 - 46.0 %   MCV 90.8 78.0 - 100.0 fL   MCH 30.5 26.0 - 34.0 pg   MCHC 33.6 30.0 - 36.0 g/dL   RDW 12.7 11.5 - 15.5 %   Platelets 150 150 - 400 K/uL  Basic metabolic panel  Status: Abnormal   Collection Time: 07/17/16  8:48 AM  Result Value Ref Range   Sodium 137 135 - 145 mmol/L   Potassium 3.4 (L) 3.5 - 5.1 mmol/L   Chloride 101 101 - 111 mmol/L   CO2 26 22 - 32 mmol/L   Glucose, Bld 108 (H) 65 - 99 mg/dL   BUN 7 6 - 20 mg/dL   Creatinine, Ser 0.90 0.44 - 1.00 mg/dL   Calcium 9.1 8.9 - 10.3 mg/dL   GFR calc non Af Amer >60 >60 mL/min   GFR calc Af Amer >60 >60 mL/min    Comment: (NOTE) The eGFR has been calculated using the CKD EPI equation. This calculation has not been validated in all clinical situations. eGFR's persistently <60 mL/min signify possible Chronic Kidney Disease.    Anion gap 10 5 - 15  Type and screen     Status: None   Collection Time: 07/17/16  8:50 AM  Result Value Ref Range   ABO/RH(D) O NEG    Antibody Screen NEG    Sample Expiration 07/20/2016    No results found.  Review of Systems  Musculoskeletal: Positive for back pain, joint pain and myalgias.  Neurological: Positive for tingling and sensory change.    Blood pressure (!) 141/84, pulse 72, temperature 98 F (36.7 C), temperature source Oral, resp. rate 18, weight 98 kg (216 lb), SpO2 96 %. Physical Exam  Constitutional: She is oriented to person, place, and time. She appears well-developed and well-nourished.  HENT:  Head: Normocephalic.  Eyes: Pupils are equal, round, and reactive to light.  Neck: Normal range of motion.  GI: Soft. Bowel sounds are normal.  Neurological: She is alert and oriented to person, place, and time. She has normal strength. GCS eye subscore is 4. GCS verbal subscore is 5. GCS motor subscore is 6.   Strength 5 out of 5 iliopsoas, quads, hamstrings, gastrocs, into tibialis, and EHL.  Skin: Skin is warm and dry.     Assessment/Plan 76 year old presents for decompression stabilization procedure at L4-5.  Guhan Bruington P, MD 07/17/2016, 10:30 AM

## 2016-07-18 DIAGNOSIS — M4316 Spondylolisthesis, lumbar region: Principal | ICD-10-CM

## 2016-07-18 MED ORDER — PANTOPRAZOLE SODIUM 40 MG PO TBEC
40.0000 mg | DELAYED_RELEASE_TABLET | Freq: Every day | ORAL | Status: DC
  Administered 2016-07-18 – 2016-07-20 (×3): 40 mg via ORAL
  Filled 2016-07-18 (×3): qty 1

## 2016-07-18 NOTE — Progress Notes (Signed)
PROGRESS NOTE                                                                                                                                                                                                             Patient Demographics:    Denise Kelly, is a 76 y.o. female, DOB - [birth year], VVO:[id number]  Admit date - 07/17/2016   Admitting Physician Kary Kos, MD  Outpatient Primary MD for the patient is WEBB, Valla Leaver, MD  LOS - 1    No chief complaint on file.      Brief Narrative   76 year old female with recurrent UTI, chronic lower extremity edema with stasis dermatitis, restless leg syndrome, chronic pain, hypertension and prior alcohol abuse admitted for spinal stenosis and underwent elective decompressive lumbar laminectomy of L4-L5. Hospitalist consulted for management of her medical issues.   Subjective:   Patient informs her back pain to be stable.   Assessment  & Plan :    Principal Problem:   Spondylolisthesis Underwent L4-L5 lumbar laminectomy with screw fixation on 5/3. Pain control per primary team. Up with therapy. PT recommends SNF.  Active Problems:   Essential hypertension, benign Diuretics resumed.  bilateral lower extremity edema Chronic without findings of heart failure on prior echo. Has stasis dermatitis. (Seems to be stable.).  Continue diuretics.    Oral candida On nystatin.  Depression Continue home medications.    No further significant medical issues to address. Will sign off. pls call  for an further questions.  Lab Results  Component Value Date   PLT 150 07/17/2016    Antibiotics  :    Anti-infectives    Start     Dose/Rate Route Frequency Ordered Stop   07/17/16 1800  ceFAZolin (ANCEF) IVPB 2g/100 mL premix     2 g 200 mL/hr over 30 Minutes Intravenous Every 8 hours 07/17/16 1635 07/18/16 0145   07/17/16 1410  vancomycin (VANCOCIN) powder  Status:   Discontinued       As needed 07/17/16 1420 07/17/16 1435   07/17/16 1045  bacitracin 50,000 Units in sodium chloride irrigation 0.9 % 500 mL irrigation  Status:  Discontinued       As needed 07/17/16 1204 07/17/16 1435   07/17/16 0600  ceFAZolin (ANCEF) IVPB 2g/100 mL premix     2 g 200 mL/hr over  30 Minutes Intravenous On call to O.R. 07/17/16 0238 07/17/16 1110        Objective:   Vitals:   07/17/16 2153 07/18/16 0115 07/18/16 0542 07/18/16 0900  BP: (!) 107/59 132/70 114/60 (!) 120/59  Pulse: 96 81 83 88  Resp: 18 16 18 16   Temp: 98.2 F (36.8 C) 98 F (36.7 C)  97.7 F (36.5 C)  TempSrc: Oral Oral  Oral  SpO2: 93% 94% 95% 93%  Weight:        Wt Readings from Last 3 Encounters:  07/17/16 98 kg (216 lb)  07/04/16 98 kg (216 lb)  06/14/16 93 kg (205 lb 1.6 oz)     Intake/Output Summary (Last 24 hours) at 07/18/16 1114 Last data filed at 07/18/16 0900  Gross per 24 hour  Intake           2540.8 ml  Output             2915 ml  Net           -374.2 ml     Physical Exam  Gen: not in distress HEENT: moist mucosa, supple neck Chest: clear b/l, no added sounds CVS: N S1&S2, no murmurs,  GI: soft, NT, ND,  Musculoskeletal: warm, Trace leg  edema, back wound clean     Data Review:    CBC  Recent Labs Lab 07/17/16 0848  WBC 7.9  HGB 15.6*  HCT 46.4*  PLT 150  MCV 90.8  MCH 30.5  MCHC 33.6  RDW 12.7    Chemistries   Recent Labs Lab 07/17/16 0848  NA 137  K 3.4*  CL 101  CO2 26  GLUCOSE 108*  BUN 7  CREATININE 0.90  CALCIUM 9.1   ------------------------------------------------------------------------------------------------------------------ No results for input(s): CHOL, HDL, LDLCALC, TRIG, CHOLHDL, LDLDIRECT in the last 72 hours.  Lab Results  Component Value Date   HGBA1C 5.2 05/26/2016   ------------------------------------------------------------------------------------------------------------------ No results for input(s): TSH,  T4TOTAL, T3FREE, THYROIDAB in the last 72 hours.  Invalid input(s): FREET3 ------------------------------------------------------------------------------------------------------------------ No results for input(s): VITAMINB12, FOLATE, FERRITIN, TIBC, IRON, RETICCTPCT in the last 72 hours.  Coagulation profile No results for input(s): INR, PROTIME in the last 168 hours.  No results for input(s): DDIMER in the last 72 hours.  Cardiac Enzymes No results for input(s): CKMB, TROPONINI, MYOGLOBIN in the last 168 hours.  Invalid input(s): CK ------------------------------------------------------------------------------------------------------------------    Component Value Date/Time   BNP 80.1 05/23/2016 2032    Inpatient Medications  Scheduled Meds: . acidophilus  2 capsule Oral Daily  . aspirin EC  81 mg Oral QHS  . citalopram  20 mg Oral QHS  . docusate sodium  100 mg Oral BID  . gabapentin  200 mg Oral Daily  . gabapentin  300 mg Oral QHS  . loratadine  10 mg Oral Daily  . nystatin  5 mL Oral QID  . pantoprazole (PROTONIX) IV  40 mg Intravenous QHS  . senna-docusate  1 tablet Oral BID  . sodium chloride flush  3 mL Intravenous Q12H  . spironolactone  25 mg Oral Daily   Continuous Infusions: . sodium chloride Stopped (07/18/16 0429)  . lactated ringers Stopped (07/17/16 1900)   PRN Meds:.acetaminophen **OR** acetaminophen, alum & mag hydroxide-simeth, cyclobenzaprine, estradiol, HYDROmorphone (DILAUDID) injection, hydroxypropyl methylcellulose / hypromellose, hydrOXYzine, menthol-cetylpyridinium **OR** phenol, methocarbamol, ondansetron **OR** ondansetron (ZOFRAN) IV, ondansetron, oxyCODONE, polyethylene glycol, pramipexole, sodium chloride flush, traZODone, zolpidem  Micro Results Recent Results (from the past 240 hour(s))  Surgical pcr  screen     Status: None   Collection Time: 07/17/16  8:48 AM  Result Value Ref Range Status   MRSA, PCR NEGATIVE NEGATIVE Final    Staphylococcus aureus NEGATIVE NEGATIVE Final    Comment:        The Xpert SA Assay (FDA approved for NASAL specimens in patients over 30 years of age), is one component of a comprehensive surveillance program.  Test performance has been validated by [facility] for patients greater than or equal to 36 year old. It is not intended to diagnose infection nor to guide or monitor treatment.     Radiology Reports Dg Lumbar Spine 2-3 Views  Result Date: 07/17/2016 CLINICAL DATA:  L4-5 PLIF. EXAM: LUMBAR SPINE - 2-3 VIEW; DG C-ARM 61-120 MIN FLUOROSCOPY TIME:  37 seconds COMPARISON:  Lumbar spine radiographs June 14, 2016 FINDINGS: Two intraoperative fluoroscopic spot views submitted, interpreting radiologist was not present at time of procedure. Bilateral L4 and L5 pedicle screws with L4-5 peek cage. IMPRESSION: Intraoperative L4-5 PLIF. Electronically Signed   By: Elon Alas M.D.   On: 07/17/2016 14:12   Dg C-arm 1-60 Min  Result Date: 07/17/2016 CLINICAL DATA:  L4-5 PLIF. EXAM: LUMBAR SPINE - 2-3 VIEW; DG C-ARM 61-120 MIN FLUOROSCOPY TIME:  37 seconds COMPARISON:  Lumbar spine radiographs June 14, 2016 FINDINGS: Two intraoperative fluoroscopic spot views submitted, interpreting radiologist was not present at time of procedure. Bilateral L4 and L5 pedicle screws with L4-5 peek cage. IMPRESSION: Intraoperative L4-5 PLIF. Electronically Signed   By: Elon Alas M.D.   On: 07/17/2016 14:12    Time Spent in minutes  25   Louellen Molder M.D on 07/18/2016 at 11:14 AM  Between 7am to 7pm - Pager - [phone]  After 7pm go to [email or web address] - password [facility]  [facility] -  Office  [phone]

## 2016-07-18 NOTE — Evaluation (Signed)
Physical Therapy Evaluation Patient Details Name: Denise Kelly MRN: [id number] DOB: Dec 02, 1940 Today's Date: 07/18/2016   History of Present Illness  Pt is a 76 year old female s/p decompressive laminectomy and fusion at L4-5. Pt has a past medical history of Alcohol abuse; Anxiety; Arthritis; Cancer; Depression; Encephalopathy; HOH; Hypertension; and Palpitations;  Eye surgery (Bilateral); Total knee arthroplasty (Right, 01/02/2014); Joint replacement (Left, 09/2009); and Intramedullary (im) nail intertrochanteric (Left, 11/29/2015).  Clinical Impression  Pt presented sitting OOB in recliner chair, awake and willing to participate in therapy session. Prior to admission, pt reported that she ambulates with use of RW and has a HH aide that comes to assist her with bathing twice a week. Pt lives alone and has family assistance PRN. Pt currently requires mod A for bed mobility, min A for transfers and min guard to ambulate a very short distance within her room with RW. Pt would continue to benefit from skilled physical therapy services at this time while admitted and after d/c to address the below listed limitations in order to improve overall safety and independence with functional mobility.     Follow Up Recommendations SNF;Supervision/Assistance - 24 hour    Equipment Recommendations  None recommended by PT    Recommendations for Other Services       Precautions / Restrictions Precautions Precautions: Back Precaution Booklet Issued: Yes (comment) Precaution Comments: handout reviewed in full Restrictions Weight Bearing Restrictions: No      Mobility  Bed Mobility Overal bed mobility: Needs Assistance Bed Mobility: Rolling;Sit to Sidelying Rolling: Min guard       Sit to sidelying: Mod assist General bed mobility comments: increased time, vc'ing for sequencing for log roll technique, mod A to move bilateral LEs up onto bed  Transfers Overall transfer level: Needs  assistance Equipment used: Rolling walker (2 wheeled) Transfers: Sit to/from Stand Sit to Stand: Min assist;+2 safety/equipment         General transfer comment: increased time, vc'ing for bilateral hand placement, min A for stability with transition  Ambulation/Gait Ambulation/Gait assistance: Min guard Ambulation Distance (Feet): 20 Feet Assistive device: Rolling walker (2 wheeled) Gait Pattern/deviations: Step-through pattern;Decreased step length - right;Decreased step length - left;Decreased stride length Gait velocity: decreased Gait velocity interpretation: Below normal speed for age/gender General Gait Details: mild instability but no LOB or need for physical assistance, min guard for safety  Stairs            Wheelchair Mobility    Modified Rankin (Stroke Patients Only)       Balance Overall balance assessment: Needs assistance Sitting-balance support: Feet supported Sitting balance-Leahy Scale: Good Sitting balance - Comments: pt able to sit EOB with min guard   Standing balance support: During functional activity;Bilateral upper extremity supported Standing balance-Leahy Scale: Poor Standing balance comment: pt reliant on bilateral UEs on RW                             Pertinent Vitals/Pain Pain Assessment: Faces Faces Pain Scale: Hurts little more Pain Location: back Pain Descriptors / Indicators: Guarding;Sore Pain Intervention(s): Monitored during session;Repositioned    Home Living Family/patient expects to be discharged to:: Private residence Living Arrangements: Alone Available Help at Discharge: Family;Available PRN/intermittently Type of Home: Apartment Home Access: Level entry     Home Layout: One level Home Equipment: Clinical cytogeneticist - 2 wheels;Cane - single point;Toilet riser      Prior Function Level of Independence: Needs assistance  Gait / Transfers Assistance Needed: Pt reported using a RW to ambulate  ADL's  / Homemaking Assistance Needed: Pt states that she takes a "bird bath" at home by herself but also has a [employer] aide twice a week to assist her with full bath        Hand Dominance   Dominant Hand: Right    Extremity/Trunk Assessment   Upper Extremity Assessment Upper Extremity Assessment: Defer to OT evaluation    Lower Extremity Assessment Lower Extremity Assessment: Generalized weakness    Cervical / Trunk Assessment Cervical / Trunk Assessment: Other exceptions Cervical / Trunk Exceptions: pt is s/p lumbar sx  Communication   Communication: No difficulties  Cognition Arousal/Alertness: Awake/alert Behavior During Therapy: Anxious Overall Cognitive Status: Impaired/Different from baseline Area of Impairment: Memory;Problem solving                     Memory: Decreased short-term memory;Decreased recall of precautions       Problem Solving: Slow processing;Decreased initiation;Requires verbal cues        General Comments      Exercises     Assessment/Plan    PT Assessment Patient needs continued PT services  PT Problem List Decreased strength;Decreased activity tolerance;Decreased balance;Decreased mobility;Decreased coordination;Decreased knowledge of use of DME;Decreased safety awareness;Decreased knowledge of precautions;Pain       PT Treatment Interventions DME instruction;Gait training;Stair training;Functional mobility training;Therapeutic activities;Therapeutic exercise;Balance training;Neuromuscular re-education;Patient/family education    PT Goals (Current goals can be found in the Care Plan section)  Acute Rehab PT Goals Patient Stated Goal: decreased pain PT Goal Formulation: With patient Time For Goal Achievement: 08/01/16 Potential to Achieve Goals: Good    Frequency Min 5X/week   Barriers to discharge Decreased caregiver support      Co-evaluation PT/OT/SLP Co-Evaluation/Treatment: Yes Reason for Co-Treatment: For  patient/therapist safety;To address functional/ADL transfers PT goals addressed during session: Balance;Mobility/safety with mobility;Proper use of DME;Strengthening/ROM         AM-PAC PT "6 Clicks" Daily Activity  Outcome Measure Difficulty turning over in bed (including adjusting bedclothes, sheets and blankets)?: A Lot Difficulty moving from lying on back to sitting on the side of the bed? : A Lot Difficulty sitting down on and standing up from a chair with arms (e.g., wheelchair, bedside commode, etc,.)?: Total Help needed moving to and from a bed to chair (including a wheelchair)?: A Little Help needed walking in hospital room?: A Lot Help needed climbing 3-5 steps with a railing? : A Lot 6 Click Score: 12    End of Session Equipment Utilized During Treatment: Gait belt Activity Tolerance: Patient tolerated treatment well Patient left: in bed;with call bell/phone within reach;with bed alarm set;with SCD's reapplied Nurse Communication: Mobility status;Precautions PT Visit Diagnosis: Other abnormalities of gait and mobility (R26.89);Pain Pain - part of body:  (back)    Time: 0935-1001 PT Time Calculation (min) (ACUTE ONLY): 26 min   Charges:   PT Evaluation $PT Eval Moderate Complexity: 1 Procedure     PT G Codes:        Sherie Don, PT, DPT [place] 07/18/2016, 10:24 AM

## 2016-07-18 NOTE — Progress Notes (Signed)
Subjective: Patient reports Patient well improved leg pain condition back pain manageable  Objective: Vital signs in last 24 hours: Temp:  [97.5 F (36.4 C)-98.2 F (36.8 C)] 98 F (36.7 C) (05/04 0115) Pulse Rate:  [72-109] 83 (05/04 0542) Resp:  [12-25] 18 (05/04 0542) BP: (106-151)/(56-97) 114/60 (05/04 0542) SpO2:  [91 %-96 %] 95 % (05/04 0542) Weight:  [98 kg (216 lb)] 98 kg (216 lb) (05/03 0853)  Intake/Output from previous day: 05/03 0701 - 05/04 0700 In: 2180.8 [P.O.:120; I.V.:1860.8; IV Piggyback:200] Out: 2915 [Urine:2350; Drains:265; Blood:300] Intake/Output this shift: No intake/output data recorded.  Strength out of 5 wound clean dry and intact  Lab Results:  Recent Labs  07/17/16 0848  WBC 7.9  HGB 15.6*  HCT 46.4*  PLT 150   BMET  Recent Labs  07/17/16 0848  NA 137  K 3.4*  CL 101  CO2 26  GLUCOSE 108*  BUN 7  CREATININE 0.90  CALCIUM 9.1    Studies/Results: Dg Lumbar Spine 2-3 Views  Result Date: 07/17/2016 CLINICAL DATA:  L4-5 PLIF. EXAM: LUMBAR SPINE - 2-3 VIEW; DG C-ARM 61-120 MIN FLUOROSCOPY TIME:  37 seconds COMPARISON:  Lumbar spine radiographs June 14, 2016 FINDINGS: Two intraoperative fluoroscopic spot views submitted, interpreting radiologist was not present at time of procedure. Bilateral L4 and L5 pedicle screws with L4-5 peek cage. IMPRESSION: Intraoperative L4-5 PLIF. Electronically Signed   By: Elon Alas M.D.   On: 07/17/2016 14:12   Dg C-arm 1-60 Min  Result Date: 07/17/2016 CLINICAL DATA:  L4-5 PLIF. EXAM: LUMBAR SPINE - 2-3 VIEW; DG C-ARM 61-120 MIN FLUOROSCOPY TIME:  37 seconds COMPARISON:  Lumbar spine radiographs June 14, 2016 FINDINGS: Two intraoperative fluoroscopic spot views submitted, interpreting radiologist was not present at time of procedure. Bilateral L4 and L5 pedicle screws with L4-5 peek cage. IMPRESSION: Intraoperative L4-5 PLIF. Electronically Signed   By: Elon Alas M.D.   On: 07/17/2016 14:12     Assessment/Plan: Continue to mobilize with physical and occupational therapy  LOS: 1 day     Kirin Pastorino P 07/18/2016, 7:38 AM

## 2016-07-18 NOTE — Progress Notes (Signed)
Patient daughter called concerning patient not receiving Keflex that Dr. Johnnye Sima ordered beginning of April. Patient very concerned cellulitis in lower extremities will return without medication.  Daughter's questions answered, no other concerns at this time.  Continue to monitor.

## 2016-07-18 NOTE — Evaluation (Signed)
Occupational Therapy Evaluation Patient Details Name: Denise Kelly MRN: [id number] DOB: 1940/04/27 Today's Date: 07/18/2016    History of Present Illness Pt is a 76 year old female s/p decompressive laminectomy and fusion at L4-5. Pt has a past medical history of Alcohol abuse; Anxiety; Arthritis; Cancer; Depression; Encephalopathy; HOH; Hypertension; and Palpitations;  Eye surgery (Bilateral); Total knee arthroplasty (Right, 01/02/2014); Joint replacement (Left, 09/2009); and Intramedullary (im) nail intertrochanteric (Left, 11/29/2015).   Clinical Impression   PTA Pt modified Independent for ADL (except for bathing, see below) and used RW for ambulation. Pt currently mod A for ADL and min assist +2 for safety with RW for mobility. Back handout provided and reviewed adls in detail. Pt educated on: avoid sitting for long periods of time, correct bed positioning for sleeping, correct sequence for bed mobility, avoiding lifting more than 5 pounds and never wash directly over incision. Pt verbalized understanding. Pt benefits from skilled OT in the acute setting to maximize safety and functional transfers. Pt will require SNF level therapy to return to PLOF.    Follow Up Recommendations  SNF;Supervision/Assistance - 24 hour    Equipment Recommendations  Other (comment) (defer to next venue)    Recommendations for Other Services       Precautions / Restrictions Precautions Precautions: Back Precaution Booklet Issued: Yes (comment) Precaution Comments: handout reviewed in full Restrictions Weight Bearing Restrictions: No      Mobility Bed Mobility Overal bed mobility: Needs Assistance Bed Mobility: Rolling;Sit to Sidelying Rolling: Min guard       Sit to sidelying: Mod assist General bed mobility comments: increased time, vc'ing for sequencing for log roll technique, mod A to move bilateral LEs up onto bed  Transfers Overall transfer level: Needs assistance Equipment used:  Rolling walker (2 wheeled) Transfers: Sit to/from Stand Sit to Stand: Min assist;+2 safety/equipment         General transfer comment: increased time, vc'ing for bilateral hand placement, min A for stability with transition    Balance Overall balance assessment: Needs assistance Sitting-balance support: Feet supported Sitting balance-Leahy Scale: Good Sitting balance - Comments: pt able to sit EOB with min guard   Standing balance support: During functional activity;Bilateral upper extremity supported Standing balance-Leahy Scale: Poor Standing balance comment: pt reliant on bilateral UEs on RW                           ADL either performed or assessed with clinical judgement   ADL Overall ADL's : Needs assistance/impaired Eating/Feeding: Modified independent;Sitting   Grooming: Wash/dry hands;Wash/dry face;Set up;Sitting Grooming Details (indicate cue type and reason): sitting EOB Upper Body Bathing: Moderate assistance   Lower Body Bathing: Maximal assistance   Upper Body Dressing : Moderate assistance   Lower Body Dressing: Maximal assistance   Toilet Transfer: Minimal assistance;Cueing for sequencing;Ambulation;RW (BSC over toilet) Toilet Transfer Details (indicate cue type and reason): simulated through transfer from recliner to bed [place] and Hygiene: Moderate assistance;Sit to/from stand       Functional mobility during ADLs: Minimal assistance;Rolling walker;Cueing for sequencing;+2 for safety/equipment General ADL Comments: Pt required assist for bathing PTA     Vision Baseline Vision/History: Wears glasses Wears Glasses: Reading only Patient Visual Report: No change from baseline Vision Assessment?: No apparent visual deficits     Perception     Praxis      Pertinent Vitals/Pain Pain Assessment: Faces Faces Pain Scale: Hurts little more Pain Location: back Pain Descriptors /  Indicators: Guarding;Sore Pain  Intervention(s): Monitored during session;Repositioned     Hand Dominance Right   Extremity/Trunk Assessment Upper Extremity Assessment Upper Extremity Assessment: Generalized weakness   Lower Extremity Assessment Lower Extremity Assessment: Defer to PT evaluation   Cervical / Trunk Assessment Cervical / Trunk Assessment: Other exceptions Cervical / Trunk Exceptions: pt is s/p lumbar sx   Communication Communication Communication: No difficulties   Cognition Arousal/Alertness: Awake/alert Behavior During Therapy: Anxious Overall Cognitive Status: Impaired/Different from baseline Area of Impairment: Memory;Problem solving                     Memory: Decreased short-term memory;Decreased recall of precautions       Problem Solving: Slow processing;Decreased initiation;Requires verbal cues     General Comments       Exercises     Shoulder Instructions      Home Living Family/patient expects to be discharged to:: Private residence Living Arrangements: Alone Available Help at Discharge: Family;Available PRN/intermittently Type of Home: Apartment Home Access: Level entry     Home Layout: One level     Bathroom Shower/Tub: Teacher, early years/pre: Handicapped height Bathroom Accessibility: Yes How Accessible: Accessible via walker Home Equipment: Clinical cytogeneticist - 2 wheels;Cane - single point;Bedside commode          Prior Functioning/Environment Level of Independence: Needs assistance  Gait / Transfers Assistance Needed: Pt reported using a RW to ambulate ADL's / Homemaking Assistance Needed: Pt states that she uses wipes to clean up similiar to a sponge bath at home by herself but also has a HH aide twice a week to assist her with full bath. Her daughter (paramedic) comes over daily to assist as well.            OT Problem List: Decreased strength;Decreased activity tolerance;Impaired balance (sitting and/or standing);Decreased  safety awareness;Decreased knowledge of use of DME or AE;Decreased knowledge of precautions;Pain      OT Treatment/Interventions: Self-care/ADL training;Energy conservation;DME and/or AE instruction;Therapeutic activities;Patient/family education;Balance training    OT Goals(Current goals can be found in the care plan section) Acute Rehab OT Goals Patient Stated Goal: decreased pain OT Goal Formulation: With patient Time For Goal Achievement: 08/01/16 Potential to Achieve Goals: Good ADL Goals Pt Will Perform Grooming: sitting;with modified independence Pt Will Perform Upper Body Bathing: with min assist;sitting Pt Will Perform Lower Body Bathing: with adaptive equipment;with min guard assist;sitting/lateral leans Pt Will Transfer to Toilet: with modified independence;ambulating (with RW) Pt Will Perform Toileting - Clothing Manipulation and hygiene: with supervision;sitting/lateral leans;with adaptive equipment Additional ADL Goal #1: Pt will recall 3/3 back precautions and maintain during ADL at supervision level Additional ADL Goal #2: Pt will perform bed mobility as precursor to ADL maintaining back precautions at supervision level  OT Frequency: Min 2X/week   Barriers to D/C: Decreased caregiver support  Pt is alone during the day       Co-evaluation PT/OT/SLP Co-Evaluation/Treatment: Yes Reason for Co-Treatment: For patient/therapist safety;To address functional/ADL transfers PT goals addressed during session: Balance;Mobility/safety with mobility;Proper use of DME;Strengthening/ROM OT goals addressed during session: ADL's and self-care;Proper use of Adaptive equipment and DME      AM-PAC PT "6 Clicks" Daily Activity     Outcome Measure Help from another person eating meals?: None Help from another person taking care of personal grooming?: A Little Help from another person toileting, which includes using toliet, bedpan, or urinal?: A Little Help from another person bathing  (including washing, rinsing, drying)?: A  Lot Help from another person to put on and taking off regular upper body clothing?: A Little Help from another person to put on and taking off regular lower body clothing?: A Lot 6 Click Score: 17   End of Session Equipment Utilized During Treatment: Gait belt;Rolling walker Nurse Communication: Mobility status;Precautions  Activity Tolerance: Patient tolerated treatment well Patient left: in bed;with call bell/phone within reach;with bed alarm set  OT Visit Diagnosis: Unsteadiness on feet (R26.81);Muscle weakness (generalized) (M62.81);Pain Pain - Right/Left: Right Pain - part of body: Leg (back)                Time: 2297-9892 OT Time Calculation (min): 30 min Charges:  OT General Charges $OT Visit: 1 Procedure OT Evaluation $OT Eval Moderate Complexity: 1 Procedure G-Codes:     26-Jul-2016  Hulda Humphrey OTR/L [place] 07-26-16, 11:46 AM

## 2016-07-18 NOTE — Progress Notes (Signed)
Patient foley d/c'd per order.  Patient stood at edge of bed, took four steps, and back to bed with two assist and with RW.  Complaints of pain, not significant increase from throughout night.  Patient continues needing cues with forgetfulness. Continue to monitor patient.

## 2016-07-18 NOTE — Care Management Note (Signed)
Case Management Note  Patient Details  Name: Denise Kelly MRN: [id number] Date of Birth: 06-30-1940  Subjective/Objective:  Pt underwent:   Lumbar four-five Posterior lumbar interbody fusion. She is from home alone.                Action/Plan: PT/OT recommending SNF. CM will notify CSW. CM following for d/c disposition.  Expected Discharge Date:                  Expected Discharge Plan:  Skilled Nursing Facility  In-House Referral:  Clinical Social Work  Discharge planning Services     Post Acute Care Choice:    Choice offered to:     DME Arranged:    DME Agency:     HH Arranged:    [place] Agency:     Status of Service:  In process, will continue to follow  If discussed at Long Length of Stay Meetings, dates discussed:    Additional Comments:  Pollie Friar, RN 07/18/2016, 1:01 PM

## 2016-07-19 LAB — COMPREHENSIVE METABOLIC PANEL
ALK PHOS: 65 U/L (ref 38–126)
ALT: 7 U/L — ABNORMAL LOW (ref 14–54)
ANION GAP: 5 (ref 5–15)
AST: 16 U/L (ref 15–41)
Albumin: 2.6 g/dL — ABNORMAL LOW (ref 3.5–5.0)
BILIRUBIN TOTAL: 0.6 mg/dL (ref 0.3–1.2)
BUN: 8 mg/dL (ref 6–20)
CALCIUM: 7.9 mg/dL — AB (ref 8.9–10.3)
CO2: 29 mmol/L (ref 22–32)
CREATININE: 0.74 mg/dL (ref 0.44–1.00)
Chloride: 103 mmol/L (ref 101–111)
GFR calc non Af Amer: >60 mL/min (ref >60)
GLUCOSE: 109 mg/dL — AB (ref 65–99)
Potassium: 3.3 mmol/L — ABNORMAL LOW (ref 3.5–5.1)
Sodium: 137 mmol/L (ref 135–145)
TOTAL PROTEIN: 5.1 g/dL — AB (ref 6.5–8.1)

## 2016-07-19 NOTE — Progress Notes (Addendum)
Patient alert and oriented X 4. PRN med administered to aid patient to have a bowel movement with no success. When offered warm prune juice, patient declined stating "I really don't feel like anything is back there wanting to come out". She requested and received PRN pain and muscle relaxer medicines as ordered. Offered to get patient out of bed, Patient refused.  Daughter is requesting that patient needs to receive Keflex as ordered prior to surgery. She stated that her mother(patient) was prescribed Keflex for Cellulitis to be taken until her follow-up appointment in June.  [name] would also like to have her mother placed in [place]. She said "its either [facility] or Ill take her home" "We hate everywhere else"  Patient and daughter encouraged to keep an open mind, research a second option in the event that [facility] may not be available. Will continue to monitor.

## 2016-07-19 NOTE — Progress Notes (Signed)
Patient had 2 incontinent incidents she states that she cannot tell when she has to void. She states that she feels numbness in lower extremities, but she has response to stimulus and able to wiggle toes. Arthor Captain LPN

## 2016-07-19 NOTE — Progress Notes (Signed)
Subjective: Patient reports Kendel is doing great condition back pain legs feel better some abdominal distention and abdominal discomfort she is passing gas  Objective: Vital signs in last 24 hours: Temp:  [97.7 F (36.5 C)-99.2 F (37.3 C)] 99.2 F (37.3 C) (05/05 0500) Pulse Rate:  [86-92] 92 (05/05 0500) Resp:  [16-20] 20 (05/05 0500) BP: (120-129)/(59-74) 121/64 (05/05 0500) SpO2:  [93 %-95 %] 95 % (05/05 0500)  Intake/Output from previous day: 05/04 0701 - 05/05 0700 In: 600 [P.O.:600] Out: 200 [Drains:200] Intake/Output this shift: No intake/output data recorded.  Awake alert strength out of 5 wound clean dry and intact  Lab Results:  Recent Labs  07/17/16 0848  WBC 7.9  HGB 15.6*  HCT 46.4*  PLT 150   BMET  Recent Labs  07/17/16 0848  NA 137  K 3.4*  CL 101  CO2 26  GLUCOSE 108*  BUN 7  CREATININE 0.90  CALCIUM 9.1    Studies/Results: Dg Lumbar Spine 2-3 Views  Result Date: 07/17/2016 CLINICAL DATA:  L4-5 PLIF. EXAM: LUMBAR SPINE - 2-3 VIEW; DG C-ARM 61-120 MIN FLUOROSCOPY TIME:  37 seconds COMPARISON:  Lumbar spine radiographs June 14, 2016 FINDINGS: Two intraoperative fluoroscopic spot views submitted, interpreting radiologist was not present at time of procedure. Bilateral L4 and L5 pedicle screws with L4-5 peek cage. IMPRESSION: Intraoperative L4-5 PLIF. Electronically Signed   By: Elon Alas M.D.   On: 07/17/2016 14:12   Dg C-arm 1-60 Min  Result Date: 07/17/2016 CLINICAL DATA:  L4-5 PLIF. EXAM: LUMBAR SPINE - 2-3 VIEW; DG C-ARM 61-120 MIN FLUOROSCOPY TIME:  37 seconds COMPARISON:  Lumbar spine radiographs June 14, 2016 FINDINGS: Two intraoperative fluoroscopic spot views submitted, interpreting radiologist was not present at time of procedure. Bilateral L4 and L5 pedicle screws with L4-5 peek cage. IMPRESSION: Intraoperative L4-5 PLIF. Electronically Signed   By: Elon Alas M.D.   On: 07/17/2016 14:12    Assessment/Plan: Postop day 2  lumbar fusion doing fairly well condition of constipation will work on bowels today continue physical occupational therapy  LOS: 2 days     Kairon Shock P 07/19/2016, 8:43 AM

## 2016-07-20 MED ORDER — BISACODYL 10 MG RE SUPP: 10.0000 mg | Freq: Every day | RECTAL | Status: DC | PRN

## 2016-07-20 NOTE — NC FL2 (Signed)
[place] LEVEL OF CARE SCREENING TOOL     IDENTIFICATION  Patient Name: Denise Kelly Birthdate: 22-May-1940 Sex: female Admission Date (Current Location): 07/17/2016  [facility] [place] Number:  Herbalist and Address:  [facility], [place] [address] [place], [place] [address]      Provider Number: [id number]  Attending Physician Name and Address:  Kary Kos, MD  Relative Name and Phone Number:       Current Level of Care: Hospital Recommended Level of Care: [place] Prior Approval Number:    Date Approved/Denied:   PASRR Number: [id number]  Discharge Plan: SNF    Current Diagnoses: Patient Active Problem List   Diagnosis Date Noted  . Spondylolisthesis 07/17/2016  . Bilateral lower extremity edema 07/17/2016  . Oral candida 07/17/2016  . Edema 07/02/2016  . Lactic acidosis 06/14/2016  . Sepsis ([place]) 06/14/2016  . Chronic pain syndrome   . Depression 05/23/2016  . Cellulitis 02/25/2016  . Chronic back pain 02/25/2016  . Hypoxia 02/25/2016  . Lesion of liver 02/25/2016  . Acute encephalopathy 01/23/2016  . Back pain   . Urinary tract infection without hematuria 01/22/2016  . Hip fracture ([place]) 11/28/2015  . Closed left hip fracture ([place]) 11/28/2015  . Dysuria 11/28/2015  . Fall 11/28/2015  . RLS (restless legs syndrome) 11/28/2015  . Altered mental state 11/30/2014  . Hypokalemia 03/14/2014  . Confusion 03/14/2014  . UTI (lower urinary tract infection) 03/14/2014  . Diarrhea 03/14/2014  . Effusion of right knee 02/22/2014  . Alcohol dependence ([place]) 02/22/2014  . Substance induced mood disorder ([place]) 02/22/2014  . Hepatic encephalopathy ([place])   . Altered mental status 02/21/2014  . SIRS (systemic inflammatory response syndrome) ([place]) 02/21/2014  . Complicated UTI (urinary tract infection)   . E coli bacteremia   . Sepsis due to Escherichia coli ([place])   . Screen for STD (sexually  transmitted disease)   . S/P total knee arthroplasty 01/02/2014  . Right knee pain 11/10/2013  . Essential hypertension, benign 11/10/2013  . LAFB (left anterior fascicular block) 11/10/2013  . Obesity, unspecified 11/10/2013    Orientation RESPIRATION BLADDER Height & Weight     Self, Time, Situation, Place  Normal Continent Weight: 216 lb (98 kg) Height:     BEHAVIORAL SYMPTOMS/MOOD NEUROLOGICAL BOWEL NUTRITION STATUS      Incontinent  (Please see d/c summary)  AMBULATORY STATUS COMMUNICATION OF NEEDS Skin   Limited Assist Verbally Surgical wounds (Closed incision back, honeycomb dressing)                       Personal Care Assistance Level of Assistance  Bathing, Feeding, Dressing Bathing Assistance: Limited assistance Feeding assistance: Independent Dressing Assistance: Limited assistance     Functional Limitations Info  Sight, Hearing, Speech Sight Info: Adequate Hearing Info: Adequate Speech Info: Adequate    SPECIAL CARE FACTORS FREQUENCY  PT (By licensed PT), OT (By licensed OT)     PT Frequency: 5x/week OT Frequency: 5x/week            Contractures Contractures Info: Not present    Additional Factors Info  Code Status, Allergies Code Status Info: Full Code Allergies Info: Atenolol, Propranolol, Sulfa Antibiotics, Coreg Carvedilol, Motrin Ibuprofen, Toprol Xl Metoprolol Tartrate, Bactrim Sulfamethoxazole-trimethoprim, Ciprofloxacin, Doxycycline Hyclate, Flovent Hfa Fluticasone, Statins           Current Medications (07/20/2016):  This is the current hospital active medication list Current  Facility-Administered Medications  Medication Dose Route Frequency Provider Last Rate Last Dose  . 0.9 %  sodium chloride infusion  250 mL Intravenous Continuous Kary Kos, MD   Stopped at 07/18/16 [phone]  . acetaminophen (TYLENOL) tablet 650 mg  650 mg Oral Q4H PRN Kary Kos, MD       Or  . acetaminophen (TYLENOL) suppository 650 mg  650 mg Rectal Q4H PRN  Kary Kos, MD      . acidophilus (RISAQUAD) capsule 2 capsule  2 capsule Oral Daily Kary Kos, MD   2 capsule at 07/19/16 1004  . alum & mag hydroxide-simeth (MAALOX/MYLANTA) 200-200-20 MG/5ML suspension 30 mL  30 mL Oral Q6H PRN Kary Kos, MD   30 mL at 07/19/16 1743  . aspirin EC tablet 81 mg  81 mg Oral QHS Kary Kos, MD   81 mg at 07/19/16 2234  . citalopram (CELEXA) tablet 20 mg  20 mg Oral QHS Kary Kos, MD   20 mg at 07/19/16 2235  . cyclobenzaprine (FLEXERIL) tablet 10 mg  10 mg Oral TID PRN Kary Kos, MD   10 mg at 07/19/16 1020  . docusate sodium (COLACE) capsule 100 mg  100 mg Oral BID Kary Kos, MD   100 mg at 07/19/16 2234  . estradiol (ESTRACE) vaginal cream 1 Applicatorful  1 Applicatorful Vaginal QHS PRN Kary Kos, MD      . gabapentin (NEURONTIN) capsule 200 mg  200 mg Oral Daily Kary Kos, MD   200 mg at 07/19/16 1004  . gabapentin (NEURONTIN) capsule 300 mg  300 mg Oral QHS Kary Kos, MD   300 mg at 07/19/16 2235  . HYDROmorphone (DILAUDID) injection 0.5 mg  0.5 mg Intravenous Q2H PRN Kary Kos, MD   0.5 mg at 07/20/16 0214  . hydroxypropyl methylcellulose / hypromellose (ISOPTO TEARS / GONIOVISC) 2.5 % ophthalmic solution   Both Eyes TID PRN Kary Kos, MD      . hydrOXYzine (ATARAX/VISTARIL) tablet 25 mg  25 mg Oral Daily PRN Kary Kos, MD      . lactated ringers infusion   Intravenous Continuous Lyndle Herrlich, MD   Stopped at 07/17/16 1900  . loratadine (CLARITIN) tablet 10 mg  10 mg Oral Daily Kary Kos, MD   10 mg at 07/19/16 1004  . menthol-cetylpyridinium (CEPACOL) lozenge 3 mg  1 lozenge Oral PRN Kary Kos, MD       Or  . phenol (CHLORASEPTIC) mouth spray 1 spray  1 spray Mouth/Throat PRN Kary Kos, MD      . methocarbamol (ROBAXIN) tablet 250 mg  250 mg Oral Daily PRN Kary Kos, MD   250 mg at 07/19/16 1533  . nystatin (MYCOSTATIN) 100000 UNIT/ML suspension 500,000 Units  5 mL Oral QID Samella Parr, NP   500,000 Units at 07/19/16 2359  . ondansetron  (ZOFRAN) tablet 4 mg  4 mg Oral Q6H PRN Kary Kos, MD       Or  . ondansetron [facility] injection 4 mg  4 mg Intravenous Q6H PRN Kary Kos, MD   4 mg at 07/18/16 0415  . ondansetron (ZOFRAN-ODT) disintegrating tablet 4 mg  4 mg Oral Q6H PRN Kary Kos, MD      . oxyCODONE (Oxy IR/ROXICODONE) immediate release tablet 10 mg  10 mg Oral Q6H PRN Kary Kos, MD   10 mg at 07/19/16 2359  . pantoprazole (PROTONIX) EC tablet 40 mg  40 mg Oral QHS Romona Curls, [place]   40 mg at 07/19/16  2234  . polyethylene glycol (MIRALAX / GLYCOLAX) packet 17 g  17 g Oral Daily PRN Kary Kos, MD   17 g at 07/19/16 1003  . pramipexole (MIRAPEX) tablet 0.375 mg  0.375 mg Oral BID PRN Kary Kos, MD      . senna-docusate (Senokot-S) tablet 1 tablet  1 tablet Oral BID Kary Kos, MD   1 tablet at 07/19/16 2234  . sodium chloride flush (NS) 0.9 % injection 3 mL  3 mL Intravenous Q12H Kary Kos, MD   3 mL at 07/19/16 2200  . sodium chloride flush (NS) 0.9 % injection 3 mL  3 mL Intravenous PRN Kary Kos, MD      . spironolactone (ALDACTONE) tablet 25 mg  25 mg Oral Daily Samella Parr, NP   25 mg at 07/19/16 1004  . traZODone (DESYREL) tablet 100 mg  100 mg Oral Daily PRN Kary Kos, MD      . zolpidem Lorrin Mais) tablet 5 mg  5 mg Oral QHS PRN Kary Kos, MD   5 mg at 07/19/16 2234     Discharge Medications: Please see discharge summary for a list of discharge medications.  Relevant Imaging Results:  Relevant Lab Results:   Additional Information SSN: [id number]  Eileen Stanford, LCSW

## 2016-07-20 NOTE — Progress Notes (Signed)
Patient dressing removed, changed. Small amount serosanguinous drainage from surgical incision on lower back.

## 2016-07-20 NOTE — Progress Notes (Signed)
Daughter at bedside.  Concerned about discharge plans for patient.  Requests patient be sent to [facility] if bed available.

## 2016-07-20 NOTE — Progress Notes (Signed)
Patient ID: Denise Kelly, female   DOB: [birth year], 76 y.o.   MRN: [id number] Subjective:  The patient is alert and pleasant. She complains of back pain.  Objective: Vital signs in last 24 hours: Temp:  [97.7 F (36.5 C)-98.3 F (36.8 C)] 98.3 F (36.8 C) (05/06 0906) Pulse Rate:  [80-104] 104 (05/06 0906) Resp:  [18-20] 20 (05/06 0906) BP: (115-135)/(61-94) 121/69 (05/06 0906) SpO2:  [90 %-94 %] 92 % (05/06 0906)  Intake/Output from previous day: 05/05 0701 - 05/06 0700 In: -  Out: 21 [Urine:1; Drains:20] Intake/Output this shift: Total I/O In: 240 [P.O.:240] Out: -   Physical exam the patient is alert and oriented. Her strength is grossly normal lower extremities.  Lab Results: No results for input(s): WBC, HGB, HCT, PLT in the last 72 hours. BMET  Recent Labs  07/19/16 1038  NA 137  K 3.3*  CL 103  CO2 29  GLUCOSE 109*  BUN 8  CREATININE 0.74  CALCIUM 7.9*    Studies/Results: No results found.  Assessment/Plan: Postop day #3: We will continue to mobilize the patient. We will discontinue her Hemovac  LOS: 3 days     Fotini Lemus D 07/20/2016, 10:30 AM

## 2016-07-20 NOTE — Progress Notes (Signed)
Daughter name is Fredia Sorrow, [phone], contact for updates on patient placement.

## 2016-07-21 MED ORDER — OXYCODONE HCL 10 MG PO TABS: 10.0000 mg | ORAL_TABLET | Freq: Four times a day (QID) | ORAL | 0 refills | Status: DC | PRN

## 2016-07-21 NOTE — Clinical Social Work Note (Signed)
Clinical Social Work Assessment  Patient Details  Name: Denise Kelly MRN: [id number] Date of Birth: Feb 24, 1941  Date of referral:  07/21/16               Reason for consult:  Discharge Planning                Permission sought to share information with:  Family Supports, Customer service manager Permission granted to share information::  Yes, Verbal Permission Granted  Name::     Dalia Heading  Agency::  [facility]  Relationship::  Daughter  Contact Information:  [phone]  Housing/Transportation Living arrangements for the past 2 months:  Apartment Source of Information:  Patient Patient Interpreter Needed:  None Criminal Activity/Legal Involvement Pertinent to Current Situation/Hospitalization:  No - Comment as needed Significant Relationships:  Adult Children Lives with:  Self Do you feel safe going back to the place where you live?  Yes Need for family participation in patient care:  No (Coment)  Care giving concerns:  No care giving concerns identified.    Social Worker assessment / plan:  CSW met with pt to address consult for new SNF. CSW introduced herself and explained role of social work. Pt from home and lives alone. P/T is recommending STR at SNF. Pt agreeable to SNF for STR and wants to go to [facility].     Pt is ready for discharge today and will go to [facility]. Pt and dtr-Charlotte are aware and agreeable to discharge plan. CSW sent clinicals to [facility] and communicated with [facility] (admissions) for room and report. Room and report provided to RN and put in treatment team sticky note. Transportation arranged with [facility]. CSW is signing off as no further needs identified.  Employment status:  Retired Forensic scientist:  Medicare PT Recommendations:  [place] / Referral to community resources:  [place]  Patient/Family's Response to care:  Pt was appreciative of CSW support and guidance.    Patient/Family's Understanding of and Emotional Response to Diagnosis, Current Treatment, and Prognosis:  Pt appears to understand diagnosis, prognosis, and current treatment. Pt feels comfortable about returning to [facility] as she has been there 4x before.   Emotional Assessment Appearance:  Appears stated age Attitude/Demeanor/Rapport:  Other (Appropriate) Affect (typically observed):  Apprehensive, Accepting Orientation:  Oriented to Self, Oriented to Place, Oriented to  Time, Oriented to Situation Alcohol / Substance use:  Other Psych involvement (Current and /or in the community):  No (Comment)  Discharge Needs  Concerns to be addressed:  Care Coordination Readmission within the last 30 days:  No Current discharge risk:  Dependent with Mobility, Lives alone Barriers to Discharge:  Continued Medical Work up   [employer], [place] 07/21/2016, 5:40 PM

## 2016-07-21 NOTE — Progress Notes (Signed)
Physical Therapy Treatment Patient Details Name: Denise Kelly MRN: [id number] DOB: March 16, 1941 Today's Date: 07/21/2016    History of Present Illness Pt is a 76 year old female s/p decompressive laminectomy and fusion at L4-5. Pt has a past medical history of Alcohol abuse; Anxiety; Arthritis; Cancer; Depression; Encephalopathy; HOH; Hypertension; and Palpitations;  Eye surgery (Bilateral); Total knee arthroplasty (Right, 01/02/2014); Joint replacement (Left, 09/2009); and Intramedullary (im) nail intertrochanteric (Left, 11/29/2015).    PT Comments    Pt progressing toward PT goals this session.  She ambulated 110 ft requiring close chair follow and  Min guard assist for safety. She recalled 2/3 back precautions and was reeducated on her 5 lb lifting restriction.  She was also educated on a walking program.  Her main limiting factor seems to be pain management, and she reported 5/10 pain after ambulation.  Current recommendations remain appropriate, as she will beneift from continued skilled therapies to improve her tolerance for functional mobility.  Will follow acutely.   Follow Up Recommendations  SNF;Supervision/Assistance - 24 hour     Equipment Recommendations  None recommended by PT    Recommendations for Other Services       Precautions / Restrictions Precautions Precautions: Back;Fall Precaution Comments: Pt with handout on tray table Restrictions Weight Bearing Restrictions: No    Mobility  Bed Mobility               General bed mobility comments: Not assessed this session  Transfers Overall transfer level: Needs assistance Equipment used: Rolling walker (2 wheeled) Transfers: Sit to/from Stand Sit to Stand: Min guard;+2 safety/equipment         General transfer comment: Extra time required.  Pt very reliant on UE's to scoot to edge of chair and for power up.  VC's required for precaution maintenance/upright posture.  +2 for safety but no physical assist  required besides hand on gait belt.  Ambulation/Gait Ambulation/Gait assistance: Min guard;+2 safety/equipment Ambulation Distance (Feet): 110 Feet Assistive device: Rolling walker (2 wheeled) Gait Pattern/deviations: Step-through pattern;Decreased stride length;Trunk flexed;Wide base of support Gait velocity: decreased Gait velocity interpretation: Below normal speed for age/gender General Gait Details: Very slow.  Heavy cues required for upright posture, to relax shoulders, and release tight grip on RW.  Pt reliant on RW for stability.  Close chair follow and two sitting breaks required for pain management and recovery from fatigue.   Stairs            Wheelchair Mobility    Modified Rankin (Stroke Patients Only)       Balance Overall balance assessment: Needs assistance Sitting-balance support: Bilateral upper extremity supported;Feet supported Sitting balance-Leahy Scale: Fair Sitting balance - Comments: Pt pressing into armrests while sitting, likely due to pain.   Standing balance support: Bilateral upper extremity supported Standing balance-Leahy Scale: Poor Standing balance comment: pt reliant on bilateral UEs on RW                            Cognition Arousal/Alertness: Awake/alert Behavior During Therapy: Anxious Overall Cognitive Status: Within Functional Limits for tasks assessed                                        Exercises      General Comments General comments (skin integrity, edema, etc.): Pt states incision site is itchy.      Pertinent  Vitals/Pain Pain Assessment: 0-10 Pain Score: 6  Pain Location: Back and L hip Pain Descriptors / Indicators: Operative site guarding;Discomfort Pain Intervention(s): Limited activity within patient's tolerance;Monitored during session;Repositioned    Home Living                      Prior Function            PT Goals (current goals can now be found in the care  plan section) Acute Rehab PT Goals Patient Stated Goal: decreased pain PT Goal Formulation: With patient Time For Goal Achievement: 08/01/16 Potential to Achieve Goals: Good Progress towards PT goals: Progressing toward goals    Frequency    Min 5X/week      PT Plan Current plan remains appropriate    Co-evaluation              AM-PAC PT "6 Clicks" Daily Activity  Outcome Measure  Difficulty turning over in bed (including adjusting bedclothes, sheets and blankets)?: A Lot Difficulty moving from lying on back to sitting on the side of the bed? : A Lot Difficulty sitting down on and standing up from a chair with arms (e.g., wheelchair, bedside commode, etc,.)?: Total Help needed moving to and from a bed to chair (including a wheelchair)?: A Little Help needed walking in hospital room?: A Lot Help needed climbing 3-5 steps with a railing? : A Lot 6 Click Score: 12    End of Session Equipment Utilized During Treatment: Gait belt Activity Tolerance: Patient tolerated treatment well Patient left: in chair;with call bell/phone within reach;with chair alarm set Nurse Communication: Mobility status PT Visit Diagnosis: Other abnormalities of gait and mobility (R26.89);Pain Pain - part of body:  (back)     Time: 0037-0488 PT Time Calculation (min) (ACUTE ONLY): 39 min  Charges:                       G Codes:       Gaetano Net SPT   Gaetano Net 07/21/2016, 12:44 PM

## 2016-07-21 NOTE — Discharge Summary (Signed)
Physician Discharge Summary  Patient ID: Denise Kelly MRN: [id number] DOB/AGE: Aug 20, 1940 76 y.o.  Admit date: 07/17/2016 Discharge date: 07/21/2016  Admission Diagnoses:Grade 1 spondylolisthesis L4-5 with severe spinal stenosis L4-5  Discharge Diagnoses: Same Principal Problem:   Spondylolisthesis Active Problems:   Essential hypertension, benign   Depression   Bilateral lower extremity edema   Oral candida   Discharged Condition: good  Hospital Course: Patient is admitted hospital underwent decompressive laminectomy and interbody fusion L4-5 postoperative patient did very well recovered in the floor on the floor had significant improvement in her preoperative back and radicular symptoms. She was mobilized with physical and occupational therapy and she was stable for discharge home on postop day 4 she'll be discharged to skilled nursing facility she can be maintained on oxycodone for pain and I will follow her up in 2 weeks' time  Consults: Significant Diagnostic Studies: Treatments: L4-5 decompression fusion Discharge Exam: Blood pressure (!) 109/58, pulse 82, temperature 98.7 F (37.1 C), temperature source Oral, resp. rate 20, weight 98 kg (216 lb), SpO2 96 %. Strength 5 out of 5 wound clean dry and intact  Disposition: Home   Allergies as of 07/21/2016      Reactions   Atenolol Shortness Of Breath   Propranolol Shortness Of Breath   Sulfa Antibiotics Hives, Swelling, Other (See Comments)   Reaction:  Facial/eye swelling    Coreg [carvedilol] Other (See Comments)   Memory loss   Motrin [ibuprofen] Palpitations   Toprol Xl [metoprolol Tartrate] Cough   Bactrim [sulfamethoxazole-trimethoprim] Swelling   SWELLING REACTION UNSPECIFIED    Ciprofloxacin Itching   Doxycycline Hyclate Other (See Comments)   HEARTBURN   Flovent Hfa [fluticasone] Itching   Statins Other (See Comments)   MENTAL STATUS CHANGE      Medication List    TAKE these medications    acetaminophen 500 MG tablet Commonly known as:  TYLENOL Take 1,000 mg by mouth every 6 (six) hours as needed for mild pain, moderate pain or headache.   acidophilus Caps capsule Take 2 capsules by mouth daily.   aspirin EC 81 MG tablet Take 81 mg by mouth at bedtime.   cephALEXin 500 MG capsule Commonly known as:  KEFLEX TAKE (1) CAPSULE TWICE DAILY.   citalopram 20 MG tablet Commonly known as:  CELEXA Take 1 tablet (20 mg total) by mouth at bedtime.   CLARITIN 10 MG tablet Generic drug:  loratadine Take 10 mg by mouth 2 (two) times daily.   estradiol 0.1 MG/GM vaginal cream Commonly known as:  ESTRACE Place 1 Applicatorful vaginally at bedtime as needed (for vaginal discomfort).   folic acid 1 MG tablet Commonly known as:  FOLVITE Take 1 tablet (1 mg total) by mouth daily.   furosemide 20 MG tablet Commonly known as:  LASIX Take 1 tablet (20 mg total) by mouth daily.   gabapentin 100 MG capsule Commonly known as:  NEURONTIN Take 200-300 mg by mouth 2 (two) times daily. 200 mg in the morning & 300 mg at bedtime   hydrOXYzine 25 MG tablet Commonly known as:  ATARAX/VISTARIL Take 25 mg by mouth daily as needed for anxiety (and/or sleep).   methocarbamol 500 MG tablet Commonly known as:  ROBAXIN Take 250 mg by mouth daily as needed (for leg cramps/spasms.).   ondansetron 4 MG disintegrating tablet Commonly known as:  ZOFRAN-ODT Take 4 mg by mouth every 6 (six) hours as needed for nausea or vomiting.   oxyCODONE 5 MG immediate release tablet Commonly  known as:  Oxy IR/ROXICODONE Take 1 tablet (5 mg total) by mouth every 6 (six) hours as needed for severe pain. What changed:  how much to take   Oxycodone HCl 10 MG Tabs Take 1 tablet (10 mg total) by mouth every 6 (six) hours as needed for severe pain. What changed:  You were already taking a medication with the same name, and this prescription was added. Make sure you understand how and when to take each.    potassium chloride 10 MEQ tablet Commonly known as:  K-DUR Take 1 tablet (10 mEq total) by mouth daily.   pramipexole 0.125 MG tablet Commonly known as:  MIRAPEX Take 0.375 mg by mouth 2 (two) times daily as needed (for restless leg syndrome).   senna-docusate 8.6-50 MG tablet Commonly known as:  SENOKOT S Take 1 tablet by mouth 2 (two) times daily.   spironolactone 25 MG tablet Commonly known as:  ALDACTONE Take 25 mg by mouth daily.   SYSTANE OP Apply 1-2 drops to eye 3 (three) times daily as needed (for dry eyes).   thiamine 100 MG tablet Take 1 tablet (100 mg total) by mouth daily.   traMADol 50 MG tablet Commonly known as:  ULTRAM Take 1 tablet (50 mg total) by mouth every 6 (six) hours as needed for moderate pain.   traZODone 50 MG tablet Commonly known as:  DESYREL Take 100 mg by mouth daily as needed for sleep.   zolpidem 10 MG tablet Commonly known as:  AMBIEN Take 5 mg by mouth at bedtime as needed for sleep.       Contact information for follow-up providers    Kary Kos, MD Follow up in 2 day(s).   Specialty:  Neurosurgery Contact information: [address] [place] [address] [phone]            Contact information for after-discharge care    Destination    [facility] Follow up.   Specialty:  Skilled Nursing Facility Contact information: [place] [phone]                  Signed: Elaina Hoops 07/21/2016, 12:38 PM

## 2016-07-21 NOTE — Progress Notes (Signed)
Patient ID: Denise Kelly, female   DOB: 09/18/1940, 76 y.o.   MRN: [id number] There is doing well improved leg pain still with some back pain  Strength 5 out of 5 wound clean dry and intact  Patient is stable for transfer to skilled nursing facility would bed becomes available. Continue physical and occupational therapy

## 2016-07-21 NOTE — Consult Note (Addendum)
   [facility] Inpatient Consult   07/21/2016  Denise Kelly 1940-10-15 [id number]    Came to visit [name] at bedside. However, she was actively being discharged with  [facility] to go to SNF. [name] was active with [facility] prior to hospital admission. Please see chart review then notes for patient outreach details by [facility] RNCM.  Will make [place] LCSW referral for follow up at [facility] since patient was active with [facility] prior. Will update [facility] RNCM as well.  Marthenia Rolling, MSN-Ed, RN,BSN [facility] Liaison [phone]

## 2016-07-21 NOTE — Progress Notes (Signed)
Report given to Michelle at [facility]. °

## 2016-07-21 NOTE — Discharge Instructions (Signed)
Spinal Fusion, Care After °These instructions give you information about caring for yourself after your procedure. Your doctor may also give you more specific instructions. Call your doctor if you have any problems or questions after your procedure. °Follow these instructions at home: °Medicines  °· Take over-the-counter and prescription medicines only as told by your doctor. These include any medicines for pain. °· Do not drive for 24 hours if you received a sedative. °· Do not drive or use heavy machinery while taking prescription pain medicine. °· If you were prescribed an antibiotic medicine, take it as told by your doctor. Do not stop taking the antibiotic even if you start to feel better. °Surgical Cut (Incision) Care  °· Follow instructions from your doctor about how to take care of your surgical cut. Make sure you: °¨ Wash your hands with soap and water before you change your bandage (dressing). If you cannot use soap and water, use hand sanitizer. °¨ Change your bandage as told by your doctor. °¨ Leave stitches (sutures), skin glue, or skin tape (adhesive) strips in place. They may need to stay in place for 2 weeks or longer. If tape strips get loose and curl up, you may trim the loose edges. Do not remove tape strips completely unless your doctor says it is okay. °· Keep your surgical cut clean and dry. Do not take baths, swim, or use a hot tub until your doctor says it is okay. °· Check your surgical cut and the area around it every day for: °¨ Redness. °¨ Swelling. °¨ Fluid. °Physical Activity  °· Return to your normal activities as told by your doctor. Ask your doctor what activities are safe for you. Rest and protect your back as much as you can. °· Follow instructions from your doctor about how to move. Use good posture to help your spine heal. °· Do not lift anything that is heavier than 8 lb (3.6 kg) or as told by your doctor until he or she says that it is safe. Do not lift anything over your  head. °· Do not twist or bend at the waist until your doctor says it is okay. °· Avoid pushing or pulling motions. °· Do not sit or lie down in the same position for long periods of time. °· Do not start to exercise until your doctor says it is okay. Ask your doctor what kinds of exercise you can do to make your back stronger. °General instructions  °· If you were given a brace, use it as told by your doctor. °· Wear compression stockings as told by your doctor. °· Do not use tobacco products. These include cigarettes, chewing tobacco, or e-cigarettes. If you need help quitting, ask your doctor. °· Keep all follow-up visits as told by your doctor. This is important. This includes any visits with your physical therapist, if this applies. °Contact a doctor if: °· Your pain gets worse. °· Your medicine does not help your pain. °· Your legs or feet become painful or swollen. °· Your surgical cut is red, swollen, or painful. °· You have fluid, blood, or pus coming from your surgical cut. °· You feel sick to your stomach (nauseous). °· You throw up (vomit). °· Your have weakness or loss of feeling (numbness) in your legs that is new or getting worse. °· You have a fever. °· You have trouble controlling when you pee (urinate) or poop (have a bowel movement). °Get help right away if: °· Your pain is very   bad. °· You have chest pain. °· You have trouble breathing. °· You start to have a cough. °These symptoms may be an emergency. Do not wait to see if the symptoms will go away. Get medical help right away. Call your local emergency services (911 in the [place] Do not drive yourself to the hospital. °This information is not intended to replace advice given to you by your health care provider. Make sure you discuss any questions you have with your health care provider. °Document Released: 06/27/2010 Document Revised: 10/30/2015 Document Reviewed: 08/16/2014 °Elsevier Interactive Patient Education © 2017 [employer] Inc[employer] ° °

## 2016-07-21 NOTE — Clinical Social Work Placement (Signed)
   CLINICAL SOCIAL WORK PLACEMENT  NOTE  Date:  07/21/2016  Patient Details  Name: Denise Kelly MRN: [id number] Date of Birth: [birth year]  Clinical Social Work is seeking post-discharge placement for this patient at the [place] level of care (*CSW will initial, date and re-position this form in  chart as items are completed):  Yes   Patient/family provided with [facility] Work Department's list of facilities offering this level of care within the geographic area requested by the patient (or if unable, by the patient's family).  Yes   Patient/family informed of their freedom to choose among providers that offer the needed level of care, that participate in Medicare, Medicaid or managed care program needed by the patient, have an available bed and are willing to accept the patient.  Yes   Patient/family informed of [facility]'s ownership interest in [facility] and [facility], as well as of the fact that they are under no obligation to receive care at these facilities.  PASRR submitted to EDS on       PASRR number received on       Existing PASRR number confirmed on 07/20/16     FL2 transmitted to all facilities in geographic area requested by pt/family on 07/21/16     FL2 transmitted to all facilities within larger geographic area on       Patient informed that his/her managed care company has contracts with or will negotiate with certain facilities, including the following:        Yes   Patient/family informed of bed offers received.  Patient chooses bed at [facility] [place]     Physician recommends and patient chooses bed at      Patient to be transferred to [facility] [place] on 07/21/16.  Patient to be transferred to facility by [facility]     Patient family notified on 07/21/16 of transfer.  Name of family member notified:  Dalia Heading     PHYSICIAN       Additional Comment:  Pt notified dtr of transfer via  phone, whicle CSW present in the room.   _______________________________________________ Truitt Merle, LCSW 07/21/2016, 5:53 PM

## 2016-07-21 NOTE — Progress Notes (Signed)
Occupational Therapy Treatment Patient Details Name: Denise Kelly MRN: [id number] DOB: 1941-01-09 Today's Date: 07/21/2016    History of present illness Pt is a 76 year old female s/p decompressive laminectomy and fusion at L4-5. Pt has a past medical history of Alcohol abuse; Anxiety; Arthritis; Cancer; Depression; Encephalopathy; HOH; Hypertension; and Palpitations;  Eye surgery (Bilateral); Total knee arthroplasty (Right, 01/02/2014); Joint replacement (Left, 09/2009); and Intramedullary (im) nail intertrochanteric (Left, 11/29/2015).   OT comments  Focus of session on toileting, standing grooming and education in use of AE for LB ADL. Pt able to state back precautions. She requires increased time for all mobility. Pt with confusion about time and events of the morning, RN aware. SNF continues to be the optimal d/c disposition.  Follow Up Recommendations  SNF;Supervision/Assistance - 24 hour    Equipment Recommendations       Recommendations for Other Services      Precautions / Restrictions Precautions Precautions: Back;Fall Precaution Comments: pt able to state 3/3 back precautions Restrictions Weight Bearing Restrictions: No       Mobility Bed Mobility               General bed mobility comments: Not assessed this session  Transfers Overall transfer level: Needs assistance Equipment used: Rolling walker (2 wheeled) Transfers: Sit to/from Stand Sit to Stand: Min guard         General transfer comment: increased time, no physical assist, heavy reliance on UEs    Balance Overall balance assessment: Needs assistance Sitting-balance support: Bilateral upper extremity supported;Feet supported Sitting balance-Leahy Scale: Fair Sitting balance - Comments: Pt pressing into armrests while sitting, likely due to pain.   Standing balance support: Bilateral upper extremity supported Standing balance-Leahy Scale: Poor Standing balance comment: pt able to release  walker in static standing at sink                           ADL either performed or assessed with clinical judgement   ADL Overall ADL's : Needs assistance/impaired     Grooming: Oral care;Standing;Wash/dry hands;Min guard         Lower Body Bathing Details (indicate cue type and reason): recommended long handled bath sponge Upper Body Dressing : Set up;Sitting     Lower Body Dressing Details (indicate cue type and reason): educated pt in use of reacher for donning pants, pt is familiar with sock aide, has no interest, reports wearing slip on shoes, but shoes in her bag were sneakers Toilet Transfer: Min guard;RW;Ambulation   Toileting- Clothing Manipulation and Hygiene: Moderate assistance;Sit to/from stand Toileting - Clothing Manipulation Details (indicate cue type and reason): educated pt in use of toileting tongs     Functional mobility during ADLs: Min Museum/gallery exhibitions officer      Cognition Arousal/Alertness: Awake/alert Behavior During Therapy: WFL for tasks assessed/performed Overall Cognitive Status: Impaired/Different from baseline Area of Impairment: Memory;Orientation                 Orientation Level: Disoriented to;Time   Memory: Decreased short-term memory         General Comments: pt confused about time of day and events of the morning, RN aware, opened pt's blinds and reoriented        Exercises     Shoulder Instructions       General Comments Pt states incision site is  itchy.    Pertinent Vitals/ Pain       Pain Assessment: Faces Pain Score: 6  Faces Pain Scale: Hurts little more Pain Location: Back and L hip Pain Descriptors / Indicators: Grimacing;Guarding Pain Intervention(s): Monitored during session;Repositioned  Home Living                                          Prior Functioning/Environment              Frequency  Min 2X/week         Progress Toward Goals  OT Goals(current goals can now be found in the care plan section)  Progress towards OT goals: Progressing toward goals  Acute Rehab OT Goals Patient Stated Goal: rehab at [facility] OT Goal Formulation: With patient Time For Goal Achievement: 08/01/16 Potential to Achieve Goals: Good  Plan Discharge plan remains appropriate    Co-evaluation                 AM-PAC PT "6 Clicks" Daily Activity     Outcome Measure   Help from another person eating meals?: None Help from another person taking care of personal grooming?: A Little Help from another person toileting, which includes using toliet, bedpan, or urinal?: A Lot Help from another person bathing (including washing, rinsing, drying)?: A Lot Help from another person to put on and taking off regular upper body clothing?: None Help from another person to put on and taking off regular lower body clothing?: A Lot 6 Click Score: 17    End of Session Equipment Utilized During Treatment: Gait belt;Rolling walker  OT Visit Diagnosis: Unsteadiness on feet (R26.81);Muscle weakness (generalized) (M62.81);Pain Pain - Right/Left: Right   Activity Tolerance Patient tolerated treatment well   Patient Left in chair;with call bell/phone within reach;with chair alarm set   Nurse Communication  (aware of disorientation to time)        Time: 1227-1242 OT Time Calculation (min): 15 min  Charges: OT General Charges $OT Visit: 1 Procedure OT Treatments $Self Care/Home Management : 8-22 mins   Malka So 07/21/2016, 1:14 PM  [phone]

## 2016-07-21 NOTE — Progress Notes (Signed)
[facility] arrival to pick up patient.  Patient left unit via stretcher.  Alert, verbal, no complaints.  All belongings including cell phone with patient.

## 2016-07-21 NOTE — Care Management Important Message (Signed)
Important Message  Patient Details  Name: Denise Kelly MRN: [id number] Date of Birth: April 24, 1940   Medicare Important Message Given:  No Patient refused to sign.   Armin Yerger 07/21/2016, 2:26 PM

## 2016-07-22 ENCOUNTER — Ambulatory Visit: Payer: Self-pay

## 2016-07-22 ENCOUNTER — Encounter: Payer: Self-pay | Admitting: *Deleted

## 2016-07-28 ENCOUNTER — Other Ambulatory Visit: Payer: Self-pay

## 2016-07-28 ENCOUNTER — Ambulatory Visit: Payer: Self-pay

## 2016-07-28 NOTE — Patient Outreach (Signed)
[facility] [place]) Care Management  07/28/2016  Denise Kelly 25-Feb-1941 [id number]   Subjective: none  Objective: none  Assessment: Referral received on 3/21. Client has been admitted 3 times since November 2017. Admitted 3/9-3/15 with Cellulitis of Right lower Leg. Readmitted on 3/31-4/3 with Sepsis due to lower extremity cellulitis; fall.   Client with scheduled admission 07/17/16- 07/21/16 with Grade 1 Spondylolisthesis L4-5 with severe spinal stenosis L4-5. Decompressive laminectomy and intebody fusion on 07/16/16. Discharged to [facility] skilled facilty.   Plan: per policy. case closed-client in hospital/facility greater than 10 days. RNCM will reopen upon notification of transition of care need back to community. Update primary care.  Thea Silversmith, RN, MSN, [place] Coordinator Cell: [phone]

## 2016-07-31 ENCOUNTER — Encounter: Payer: Self-pay | Admitting: *Deleted

## 2016-07-31 ENCOUNTER — Other Ambulatory Visit: Payer: Self-pay | Admitting: *Deleted

## 2016-07-31 NOTE — Patient Outreach (Signed)
[facility]) Care Management  07/31/2016  Denise Kelly 24-Oct-1940 [id number]   CSW was able to make contact with patient today to perform the initial assessment, as well as assess and assist with social work needs and services, when [place] met with patient at [facility] in [place], [place] where patient currently resides to receive short-term rehabilitative services.  CSW introduced self, explained role and types of services provided through [place] [facility] ([place] [facility]).  CSW further explained to patient that CSW works with patient's RNCM, also with [place] [facility], Thea Silversmith. CSW then explained the reason for the visit, indicating that Denise Kelly thought that patient would benefit from social work services and resources to assist with discharge planning needs and services from the skilled facility.  CSW obtained two HIPAA compliant identifiers from patient, which included patient's name and date of birth. Patient admits to living at home alone, but has a daughter, Dalia Heading that is extremely supportive and very much involved with patient's plan of care. The current plan is for patient to return home to live independently, upon release from [facility].  Patient is agreeable to having CSW arrange for home care services and durable medical equipment, if recommended.  CSW has agreed to follow-up with patient in two weeks to assess and assist with discharge planning needs and services. Nat Christen, BSW, MSW, LCSW  Licensed Education officer, environmental Health System  Mailing [place] [address] [place], [place] [address] Physical Address-[address], [place], [place] [address] Toll Free Main # [phone] Fax # [phone] Cell # [phone]  Office # [phone] [email or web address]

## 2016-08-12 ENCOUNTER — Other Ambulatory Visit: Payer: Self-pay | Admitting: *Deleted

## 2016-08-12 NOTE — Patient Outreach (Signed)
[facility]) Care Management  08/12/2016  Denise Kelly August 18, 1940 [id number]   Patient called into main [facility] Care Management office today, speaking with Freda Jackson, Care Management Assistant, requesting that CSW cancel her visit with patient at the skilled nursing facility this week, postponing until next week.  Per patient's request, CSW has cancelled the scheduled visit for this week, rescheduling for Thursday, June 7th at [place] will plan to meet with patient at [facility] in [place], [place] where patient currently resides to receive short-term rehabilitative services. Nat Christen, BSW, MSW, LCSW  Licensed Education officer, environmental Health System  Mailing [place] [address] [place], [place] [address] Physical Address-[address], [place], [place] [address] Toll Free Main # [phone] Fax # [phone] Cell # [phone]  Office # [phone] [email or web address]

## 2016-08-14 ENCOUNTER — Ambulatory Visit: Payer: Self-pay | Admitting: *Deleted

## 2016-08-21 ENCOUNTER — Other Ambulatory Visit: Payer: Self-pay | Admitting: *Deleted

## 2016-08-21 NOTE — Patient Outreach (Signed)
[facility]) Care Management  08/21/2016  Denise Kelly 12/04/1940 [id number]  CSW was able to make initial contact with patient today to assess and assist with social work needs and services. Patient reports being "back home", denying any social work needs at present.  CSW will perform a case closure on patient, as all goals of treatment have been met from social work standpoint and no additional social work needs have been identified at this time.  CSW will notify patient's RNCM with [facility] Care Management, Juana Wallace of CSW's plans to close patient's case.  CSW will fax an update to patient's Primary Care Physician, Dr. Carol Webb to ensure that they are aware of CSW's involvement with patient's plan of care.  CSW will submit a case closure request to Lavelda Comer, Care Management Assistant with [facility], in the form of an In Basket message.  CSW will ensure that Mrs. Comer is aware of Mrs. Wallace's, RNCM with [facility], continued involvement with patient's care. Joanna Saporito, BSW, MSW, LCSW  Licensed Clinical Social Worker  [facility] Care Management [address] [facility]  Mailing Address-[address], [place], [place] [address] Physical Address-[address], [place],  [address] Toll Free Main # [phone] Fax # [phone] Cell # [phone]  Office # [phone] [email or web address]         

## 2016-08-22 ENCOUNTER — Other Ambulatory Visit: Payer: Self-pay

## 2016-08-22 NOTE — Patient Outreach (Signed)
[place] [facility]) Care Management  08/22/2016  ERLA BACCHI 12-02-1940 [id number]   RNCM received message on 08/21/16 that client is now home from Skilled facility. RNCM called to follow up. No answer. HIPPA compliant message left.  Plan: follow up next business.  Thea Silversmith, RN, MSN, [place] Coordinator Cell: [phone]

## 2016-08-25 ENCOUNTER — Other Ambulatory Visit: Payer: Self-pay

## 2016-08-25 NOTE — Patient Outreach (Signed)
[facility]) Care Management  08/25/2016  Denise Kelly [birth year] [id number]  Subjective: "I don't need anymore follow up".  Objective: none  Assessment: RNCM received notification from Social worker, Nat Christen on 08/21/16 that client is now home from Skilled facility. RNCM called for transition of care/ follow up. RNCM spoke with client who reports that she does not need anymore follow up. Client reports she has followed up with her providers. Denies any issues or concerns. Declines further participation in Care management program.  Plan: RNCM will not open case.  Thea Silversmith, RN, MSN, [place] Coordinator Cell: [phone]

## 2016-08-27 ENCOUNTER — Ambulatory Visit (INDEPENDENT_AMBULATORY_CARE_PROVIDER_SITE_OTHER): Payer: Medicare Other | Admitting: Infectious Diseases

## 2016-08-27 ENCOUNTER — Encounter: Payer: Self-pay | Admitting: Infectious Diseases

## 2016-08-27 DIAGNOSIS — R600 Localized edema: Secondary | ICD-10-CM

## 2016-08-27 MED ORDER — CEPHALEXIN 500 MG PO CAPS: ORAL_CAPSULE | ORAL | 1 refills | Status: DC

## 2016-08-27 NOTE — Progress Notes (Signed)
   Subjective:    Patient ID: Denise Kelly, female    DOB: Sep 17, 1940, 76 y.o.   MRN: [id number]  HPI 76 yo F with hx of L THR (11-2015) and R and L TKR (12-2013).  She comes to ED on 3-9 with hx of RLE cellulitis present since 02-2016. She had been hospitalized at that time with cellulitis.  shehas been seen by her PCP for this- prev given keflex, then switched to doxy last week.  She developed worsening pain, erythema, and swelling during this period.  In ED her WBC was normal, she was afebrile. U/s (-) for DVT. She was started on vancomycin, ceftriaxone, and lasix.  She underwent MRI on 3-10 which suggested mild cellulitis, no osteo or abscess.  Cardiac echo (02-2016) showed nl ef and nl wall motion.   She was ultimately d/c on keflex and then fell on 3-31. She was felt to be septic and was again treated with vanco/zosyn. She was d/c home on 4-3 on doxy for 5 days.  She resumed her keflex at d/c.   She was hospitalized last month and underwent L4-5 fusion. She was sent to SNF and her keflex was d/c there. Her surgical wound has healed well.  After she got home she resumed her keflex until seen by PCP and given doxy.   Her RLE > LLE has been weeping and swelling more. No f/c. Has been "cold".     Review of Systems  Constitutional: Negative for appetite change, chills, fever and unexpected weight change.  Cardiovascular: Positive for leg swelling.  Gastrointestinal: Positive for constipation. Negative for diarrhea.  Genitourinary: Positive for decreased urine volume. Negative for difficulty urinating.  using miralax.  Taking spironolactone extra doses for 3 days to improve u/o.      Objective:   Physical Exam  Constitutional: She appears well-developed and well-nourished.  HENT:  Mouth/Throat: No oropharyngeal exudate.  Eyes: EOM are normal. Pupils are equal, round, and reactive to light.  Neck: Neck supple.  Cardiovascular: Normal rate, regular rhythm and normal heart  sounds.   Pulmonary/Chest: Effort normal and breath sounds normal.  Abdominal: Soft. Bowel sounds are normal. There is no tenderness. There is no rebound.  Musculoskeletal:       Legs: Lymphadenopathy:    She has no cervical adenopathy.      Assessment & Plan:

## 2016-08-27 NOTE — Assessment & Plan Note (Addendum)
She is scheduled for LE dopplers (limited by daughter's ability to transport) Advised her to keep her legs up above her heart.  Will have her seen by wound care.  Will resume her keflex She will discuss with her CV MD Will complete her 3 days of increased dose of spironolactone Will see her back in 2-3 weeks.

## 2016-08-29 ENCOUNTER — Other Ambulatory Visit: Payer: Self-pay | Admitting: Neurosurgery

## 2016-08-29 ENCOUNTER — Telehealth: Payer: Self-pay | Admitting: *Deleted

## 2016-08-29 DIAGNOSIS — M79604 Pain in right leg: Secondary | ICD-10-CM

## 2016-08-29 DIAGNOSIS — M79605 Pain in left leg: Principal | ICD-10-CM

## 2016-08-29 NOTE — Telephone Encounter (Signed)
Patient called to ask about her referral to wound care. I advised her that the [place] that worked with MD on 08/27/16 was out today and I would ask her to call patient back on Monday 09/01/16. Myrtis Hopping

## 2016-09-01 ENCOUNTER — Ambulatory Visit
Admission: RE | Admit: 2016-09-01 | Discharge: 2016-09-01 | Disposition: A | Discharge status: Home / Self Care | Payer: Medicare Other | Source: Ambulatory Visit | Attending: Neurosurgery | Admitting: Neurosurgery

## 2016-09-01 ENCOUNTER — Other Ambulatory Visit: Payer: Self-pay | Admitting: Neurosurgery

## 2016-09-01 ENCOUNTER — Telehealth: Payer: Self-pay

## 2016-09-01 DIAGNOSIS — M79605 Pain in left leg: Principal | ICD-10-CM

## 2016-09-01 DIAGNOSIS — M79604 Pain in right leg: Secondary | ICD-10-CM

## 2016-09-01 NOTE — Telephone Encounter (Signed)
SENT NOTES TO SCHEDULING 

## 2016-09-02 ENCOUNTER — Telehealth: Payer: Self-pay | Admitting: *Deleted

## 2016-09-02 NOTE — Telephone Encounter (Signed)
Contacted patient daughter about wound care appt and she asked if I could email her the info. The patient is scheduled to see Dr Dellia Nims Monday 09/15/16 at 11 am. They are located at [address], If you can not keep the appt please contact them at [phone]. They will also contact the daughter if they have an earlier appointment come available.  Patient daughter is also concerned that the patient legs are weeping more and she is wetting several bandages a day. (many changes). She wants to know what they can do in the mean time? Advised will ask the doctor and give them a call back as soon as possible.

## 2016-09-03 NOTE — Telephone Encounter (Signed)
Keep dry dressings on legs as much as possible, f/u with Dr Dellia Nims.

## 2016-09-04 ENCOUNTER — Other Ambulatory Visit: Payer: Self-pay | Admitting: Student

## 2016-09-05 ENCOUNTER — Ambulatory Visit
Admission: RE | Admit: 2016-09-05 | Discharge: 2016-09-05 | Disposition: A | Discharge status: Home / Self Care | Payer: Medicare Other | Source: Ambulatory Visit | Attending: Student | Admitting: Student

## 2016-09-05 ENCOUNTER — Other Ambulatory Visit: Payer: Self-pay | Admitting: Student

## 2016-09-05 DIAGNOSIS — M79604 Pain in right leg: Secondary | ICD-10-CM

## 2016-09-09 NOTE — Telephone Encounter (Signed)
Called patient daughter and had to leave a message to just keep legs clean and dry until her visit with wound care.

## 2016-09-15 ENCOUNTER — Encounter (HOSPITAL_BASED_OUTPATIENT_CLINIC_OR_DEPARTMENT_OTHER): Payer: Medicare Other

## 2016-09-16 ENCOUNTER — Other Ambulatory Visit: Payer: Self-pay

## 2016-09-16 DIAGNOSIS — R600 Localized edema: Secondary | ICD-10-CM

## 2016-09-16 MED ORDER — CEPHALEXIN 500 MG PO CAPS: ORAL_CAPSULE | ORAL | 1 refills | Status: DC

## 2016-09-16 NOTE — Telephone Encounter (Signed)
[name] left message on voice mail:   Her Mother has been diagnosed with peripheral artery disease by Dr Saintclair Halsted.  She was started on Keflex by Dr Johnnye Sima for questionable cellulitis .  The daughter wonders if her Mother should continue the keflex with the new diagnosis.   I spoke with daughter and told her we would need office  and procedural notes related to the visit with Dr Saintclair Halsted.  Once this information is received and Dr Johnnye Sima has reviewed it our office will call her back.   Daughter states her Mother has a scheduled appointment with Dr Johnnye Sima in August, 2017 and wonders if they need to cancel appointment.    Laverle Patter, RN

## 2016-09-16 NOTE — Telephone Encounter (Signed)
Patient has a follow-up appt for 10/01/16 with Dr. Johnnye Sima.  Refill sent to pharmacy.  [name] concerned about the patient's bilateral lower extremity swelling and continuing "weeping" of fluid which soaks through the dressings in less than 48 hours.  Her daughter shared that the patient is "miserable and doesn't want to leave the house."   The patient refused to go to the [facility] for evaluation and treatment and is using "[facility]" to apply dressing which are changed every 2 days.  The daughter shared that she is going to remake the appointment at the [facility] as soon as possible.  RN advised that the patient needs to keep her legs elevated above the level of the heart to help with blood return.  The daughter stated that the patient can barely get into bed and that her recliner does not get her legs above the level of the heart.  RN advised the daughter to talk with the Wound Care doctor and ask about ordering a "lift chair" from a medical supply that would be able to allow the patient better venous return.

## 2016-09-16 NOTE — Telephone Encounter (Signed)
Continue keflex F/u August 2018?

## 2016-09-18 NOTE — Telephone Encounter (Signed)
Thanks Langley Gauss, pt needs WOC f/u

## 2016-09-18 NOTE — Progress Notes (Signed)
Cardiology Office Note    Date:  09/19/2016   ID:  ALABAMA DOIG, Alferd Apa Mar 25, 1940, MRN [id number]  PCP:  [name], MD  Cardiologist:  Dr. Marlou Porch   CC: weeping legs.    Denise Kelly is a 76 y.o. female who is being seen today for the evaluation of LE edema at the request of [name], MD.  History of Present Illness:  Denise Kelly is a 76 y.o. female with a history of HTN, GERD, RBBB, hypoalbuminemia, obesity, LE edema who presents to clinic for evaluation of weeping LE edema.   She's had several cardiac catheterizations and stress test which came back normal per patient. I do not see any of these in EPIC. Event monitor in 2009 showed rare PVCs. Sress test 04/2009 was low risk with no ischemia. 2D ECHO 2015 showed normal LV EF with mild LVH and normal RV size and function.   She fell and broke hip 11/2015 and had ORIF. Since that time she has had multiple admissions.   Admitted 11/7-11/11/17 for AMS felt to be related to medication and UTI.  Admitted 12/11-12/14/2017 for cellulitis.  Admitted 3/9-3/15/18 for cellulitis.  Admitted 3/31-06/17/16 for sepsis 2/2 cellulitis.  Last admission was 5/3-07/21/16 for decompressive laminectomy and interbody fusion L4-5 by Dr. Saintclair Halsted.   She also has been seen by Dr. Johnnye Sima with infectious disease who has treated her with Keflex.   She has been having issues with LE edema and leg pain since surgery. Dr. Saintclair Halsted ordered LE venous dopplers which were negative for DVT and US arterial dopplers which showed elevated ABIs suspected to be related to calcific peripheral arthrosclerosis and evidence of bilateral SFA peripheral vascular disease. She has been set up to see Dr. Donzetta Matters with vascular surgery next week.   She has a history of heavy alcohol use but per review of PCP notes, she has had a negative [place] for cirrhosis. She no longer drinks. She was treated with lasix in the past but had persistent hypokalemia so this was discontinued.   Today she  presents to clinic for evaluation of weeping legs. The cellulitis has cleared but she continues to have weeping legs that are painful. She has had home health coming to wrap her legs. She soaks the bandages with fluid within hours. Her spiro has been increased to 50mg  BID. This his increased her urination but not really helped her LE edema. She sleeps in a recliner because its more comfortable but denies orthopnea or PND. No dizziness or syncope. No blood in stool or urine. Does not have a very good diet and drinks a lot of coke. Daughter, a paramedic, is here with her today.     Past Medical History:  Diagnosis Date  . Alcohol abuse    ETOH and xanax  . Anxiety    Panic attacks   . Arthritis   . Cancer ([place])    skin- basal , squamous , melonoma- right hand  . Cellulitis    right leg  . Complication of anesthesia 2009   "felt drunk for a week after"- AVM- both times- felt drunk  . Depression   . Encephalopathy   . GERD (gastroesophageal reflux disease)   . HOH (hard of hearing)   . HOH (hard of hearing)   . Hypertension    not on mediacations  . Palpitations   . PONV (postoperative nausea and vomiting)   . Spondylolisthesis of lumbosacral region   . UTI (urinary tract infection)  frequently    Past Surgical History:  Procedure Laterality Date  . BREAST SURGERY Right    2 breast biopsies  . 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 COLONOSCOPY  2011   polyps  . EYE SURGERY Bilateral    cataracts  . INTRAMEDULLARY (IM) NAIL INTERTROCHANTERIC Left 11/29/2015   Procedure: LEFT HIP   NAIL;  Surgeon: Renette Butters, MD;  Location: [facility];  Service: Orthopedics;  Laterality: Left;  . JOINT REPLACEMENT Left 09/2009   knee  . LUMBAR FUSION  07/17/2016   Lumbar four-five Posterior lumbar interbody fusion (N/A)  . TONSILLECTOMY    . TOTAL KNEE ARTHROPLASTY Right 01/02/2014   Procedure: TOTAL KNEE ARTHROPLASTY;  Surgeon: [name], MD;  Location: [facility];  Service:  Orthopedics;  Laterality: Right;  . TUBAL LIGATION      Current Medications: Outpatient Medications Prior to Visit  Medication Sig Dispense Refill  . acetaminophen (TYLENOL) 500 MG tablet Take 1,000 mg by mouth every 6 (six) hours as needed for mild pain, moderate pain or headache.     Marland Kitchen acidophilus (RISAQUAD) CAPS capsule Take 2 capsules by mouth daily. 30 capsule 0  . aspirin EC 81 MG tablet Take 81 mg by mouth at bedtime.     . cephALEXin (KEFLEX) 500 MG capsule TAKE (1) CAPSULE TWICE DAILY. 60 capsule 1  . citalopram (CELEXA) 20 MG tablet Take 1 tablet (20 mg total) by mouth at bedtime. 30 tablet 1  . estradiol (ESTRACE) 0.1 MG/GM vaginal cream Place 1 Applicatorful vaginally at bedtime as needed (for vaginal discomfort).    . folic acid (FOLVITE) 1 MG tablet Take 1 tablet (1 mg total) by mouth daily.    Marland Kitchen gabapentin (NEURONTIN) 100 MG capsule Take 200-300 mg by mouth 2 (two) times daily. 200 mg in the morning & 300 mg at bedtime    . hydrOXYzine (ATARAX/VISTARIL) 25 MG tablet Take 25 mg by mouth daily as needed for anxiety (and/or sleep).     . loratadine (CLARITIN) 10 MG tablet Take 10 mg by mouth 2 (two) times daily.     . methocarbamol (ROBAXIN) 500 MG tablet Take 250 mg by mouth daily as needed (for leg cramps/spasms.).     Marland Kitchen ondansetron (ZOFRAN-ODT) 4 MG disintegrating tablet Take 4 mg by mouth every 6 (six) hours as needed for nausea or vomiting.     Marland Kitchen oxyCODONE (OXY IR/ROXICODONE) 5 MG immediate release tablet Take 1 tablet (5 mg total) by mouth every 6 (six) hours as needed for severe pain. 15 tablet 0  . oxyCODONE 10 MG TABS Take 1 tablet (10 mg total) by mouth every 6 (six) hours as needed for severe pain. 60 tablet 0  . Polyethyl Glycol-Propyl Glycol (SYSTANE OP) Apply 1-2 drops to eye 3 (three) times daily as needed (for dry eyes).    . potassium chloride (K-DUR) 10 MEQ tablet Take 1 tablet (10 mEq total) by mouth daily. 30 tablet 0  . pramipexole (MIRAPEX) 0.125 MG tablet  Take 0.375 mg by mouth 2 (two) times daily as needed (for restless leg syndrome).     Marland Kitchen senna-docusate (SENOKOT S) 8.6-50 MG tablet Take 1 tablet by mouth 2 (two) times daily.    Marland Kitchen spironolactone (ALDACTONE) 25 MG tablet Take 5 mg by mouth 2 (two) times daily.     Marland Kitchen thiamine 100 MG tablet Take 1 tablet (100 mg total) by mouth daily.    . traMADol (ULTRAM) 50 MG tablet  Take 1 tablet (50 mg total) by mouth every 6 (six) hours as needed for moderate pain. 20 tablet 0  . traZODone (DESYREL) 50 MG tablet Take 100 mg by mouth daily as needed for sleep.    Marland Kitchen zolpidem (AMBIEN) 10 MG tablet Take 5 mg by mouth at bedtime as needed for sleep.    . furosemide (LASIX) 20 MG tablet Take 1 tablet (20 mg total) by mouth daily. 30 tablet 0   No facility-administered medications prior to visit.      Allergies:   Atenolol; Propranolol; Sulfa antibiotics; Coreg [carvedilol]; Motrin [ibuprofen]; Toprol xl [metoprolol tartrate]; Bactrim [sulfamethoxazole-trimethoprim]; Ciprofloxacin; Doxycycline hyclate; Flovent hfa [fluticasone]; and Statins   Social History   Social History  . Marital status: Single    Spouse name: N/A  . Number of children: N/A  . Years of education: N/A   Social History Main Topics  . Smoking status: Former Smoker    Years: 20.00  . Smokeless tobacco: Never Used     Comment: quit smoking many years ago .  quit 1994  . Alcohol use No     Comment: unable to get alcohol per family not since September  . Drug use: No  . Sexual activity: No   Other Topics Concern  . None   Social History Narrative  . None     Family History:  The patient's family history includes Cancer in her father and mother; Hypertension in her other.     ROS:   Please see the history of present illness.    ROS All other systems reviewed and are negative.   PHYSICAL EXAM:   VS:  BP 120/70   Pulse 79   Ht 5' 7.5" (1.715 m)   Wt 212 lb 6.4 oz (96.3 kg)   SpO2 93%   BMI 32.78 kg/m    GEN: Well  nourished, well developed, in no acute distress, morbidly obese  HEENT: normal  Neck: no JVD, carotid bruits, or masses Cardiac: RRR; no murmurs, rubs, or gallops, 2+ bilateral weeping LE edema  Respiratory:  clear to auscultation bilaterally, normal work of breathing GI: soft, nontender, nondistended, + BS MS: no deformity or atrophy  Skin: warm and dry, no rash Neuro:  Alert and Oriented x 3, Strength and sensation are intact Psych: euthymic mood, full affect   Wt Readings from Last 3 Encounters:  09/19/16 212 lb 6.4 oz (96.3 kg)  08/27/16 215 lb (97.5 kg)  07/17/16 216 lb (98 kg)      Studies/Labs Reviewed:   EKG:  EKG is ordered today.  The ekg ordered today demonstrates NSR, RBBB HR 79  Recent Labs: 02/26/2016: TSH 1.622 05/23/2016: B Natriuretic Peptide 80.1 06/14/2016: Magnesium 1.5 07/17/2016: Hemoglobin 15.6; Platelets 150 07/19/2016: ALT 7; BUN 8; Creatinine, Ser 0.74; Potassium 3.3; Sodium 137   Lipid Panel No results found for: CHOL, TRIG, HDL, CHOLHDL, VLDL, LDLCALC, LDLDIRECT  Additional studies/ records that were reviewed today include:  2D ECHO: 08/16/2013 LV EF: 60% -  65% Study Conclusions - Left ventricle: The cavity size was normal. Wall thickness was increased in a pattern of mild LVH. Systolic function was normal. The estimated ejection fraction was in the range of 60% to 65%. Wall motion was normal; there were no regional wall motion abnormalities. Doppler parameters are consistent with abnormal left ventricular relaxation (grade 1 diastolic dysfunction). - Aortic valve: There was no stenosis. - Mitral valve: There was trivial regurgitation. - Left atrium: The atrium was mildly dilated. -  Right ventricle: The cavity size was normal. Systolic function was normal. - Pulmonary arteries: No complete TR doppler jet so unable to estimate PA systolic pressure. - Inferior vena cava: The vessel was normal in size. The respirophasic diameter  changes were in the normal range (>= 50%), consistent with normal central venous pressure. Impressions: - Normal LV size with mild LV hypertrophy. EF 60-65%. Normal RV size and systolic function. No significant valvular abnormalities.   ASSESSMENT & PLAN:   LE weeping: she has been having issues with this ever since hip ORIF last fall. She has also had multiple cases of cellulitis that is now resolved. She has been treated with Keflex by Dr Johnnye Sima. She has been treated with lasix in the past but this was discontinued 2/2 persistent hypokalemia. She is currently on spiro 50mg  BID which doesn't seem to be helping. Review of note from PCP reports that she has had a relatively recent abdominal US that was negative for cirrhosis. Last CMP showed albumin 2.6. I have asked her to elevate her legs, continue wrapping and increase protein in diet. I will update a 2D ECHO and order BNP although I do not think this is CHF related. I have also ordered a UA to evaluate for protein to r/o nephrotic syndrome.   HTN: BP well controlled currently.   Obesity: Body mass index is 32.78 kg/m.  PAD: elevated ABIs suspected to be related to calcific peripheral arthrosclerosis and evidence of bilateral SFA peripheral vascular disease. She has been set up to see Dr. Donzetta Matters with vascular surgery.  Medication Adjustments/Labs and Tests Ordered: Current medicines are reviewed at length with the patient today.  Concerns regarding medicines are outlined above.  Medication changes, Labs and Tests ordered today are listed in the Patient Instructions below. Patient Instructions  Medication Instructions:  Your physician recommends that you continue on your current medications as directed. Please refer to the Current Medication list given to you today.   Labwork: TODAY:  CMET & PRO BNP & UA  Testing/Procedures: Your physician has requested that you have an echocardiogram. Echocardiography is a painless test that uses  sound waves to create images of your heart. It provides your doctor with information about the size and shape of your heart and how well your heart's chambers and valves are working. This procedure takes approximately one hour. There are no restrictions for this procedure.    Follow-Up: Your physician recommends that you schedule a follow-up appointment in: DEPENDING UPON RESULTS   Any Other Special Instructions Will Be Listed Below (If Applicable). Echocardiogram An echocardiogram, or echocardiography, uses sound waves (ultrasound) to produce an image of your heart. The echocardiogram is simple, painless, obtained within a short period of time, and offers valuable information to your health care provider. The images from an echocardiogram can provide information such as:  Evidence of coronary artery disease (CAD).  Heart size.  Heart muscle function.  Heart valve function.  Aneurysm detection.  Evidence of a past heart attack.  Fluid buildup around the heart.  Heart muscle thickening.  Assess heart valve function.  Tell a health care provider about:  Any allergies you have.  All medicines you are taking, including vitamins, herbs, eye drops, creams, and over-the-counter medicines.  Any problems you or family members have had with anesthetic medicines.  Any blood disorders you have.  Any surgeries you have had.  Any medical conditions you have.  Whether you are pregnant or may be pregnant. What happens before the procedure? No  special preparation is needed. Eat and drink normally. What happens during the procedure?  In order to produce an image of your heart, gel will be applied to your chest and a wand-like tool (transducer) will be moved over your chest. The gel will help transmit the sound waves from the transducer. The sound waves will harmlessly bounce off your heart to allow the heart images to be captured in real-time motion. These images will then be  recorded.  You may need an IV to receive a medicine that improves the quality of the pictures. What happens after the procedure? You may return to your normal schedule including diet, activities, and medicines, unless your health care provider tells you otherwise. This information is not intended to replace advice given to you by your health care provider. Make sure you discuss any questions you have with your health care provider. Document Released: 02/29/2000 Document Revised: 10/20/2015 Document Reviewed: 11/08/2012 Elsevier Interactive Patient Education  2017 [employer].     If you need a refill on your cardiac medications before your next appointment, please call your pharmacy.      Signed, Angelena Form, PA-C  09/19/2016 4:34 PM    [facility] [place], [place], [place]  [address] Phone: [phone]; Fax: [phone]

## 2016-09-19 ENCOUNTER — Encounter: Payer: Self-pay | Admitting: Physician Assistant

## 2016-09-19 ENCOUNTER — Encounter (HOSPITAL_COMMUNITY): Payer: Self-pay

## 2016-09-19 ENCOUNTER — Ambulatory Visit (INDEPENDENT_AMBULATORY_CARE_PROVIDER_SITE_OTHER): Payer: Medicare Other | Admitting: Physician Assistant

## 2016-09-19 VITALS — BP 120/70 | HR 79 | Ht 67.5 in | Wt 212.4 lb

## 2016-09-19 DIAGNOSIS — R600 Localized edema: Secondary | ICD-10-CM | POA: Diagnosis not present

## 2016-09-19 DIAGNOSIS — I10 Essential (primary) hypertension: Secondary | ICD-10-CM | POA: Diagnosis not present

## 2016-09-19 NOTE — Telephone Encounter (Signed)
Patient called back to speak with Langley Gauss RN about a lift chair.

## 2016-09-19 NOTE — Patient Instructions (Addendum)
Medication Instructions:  Your physician recommends that you continue on your current medications as directed. Please refer to the Current Medication list given to you today.   Labwork: TODAY:  CMET & PRO BNP & UA  Testing/Procedures: Your physician has requested that you have an echocardiogram. Echocardiography is a painless test that uses sound waves to create images of your heart. It provides your doctor with information about the size and shape of your heart and how well your heart's chambers and valves are working. This procedure takes approximately one hour. There are no restrictions for this procedure.    Follow-Up: Your physician recommends that you schedule a follow-up appointment in: DEPENDING UPON RESULTS   Any Other Special Instructions Will Be Listed Below (If Applicable). Echocardiogram An echocardiogram, or echocardiography, uses sound waves (ultrasound) to produce an image of your heart. The echocardiogram is simple, painless, obtained within a short period of time, and offers valuable information to your health care provider. The images from an echocardiogram can provide information such as:  Evidence of coronary artery disease (CAD).  Heart size.  Heart muscle function.  Heart valve function.  Aneurysm detection.  Evidence of a past heart attack.  Fluid buildup around the heart.  Heart muscle thickening.  Assess heart valve function.  Tell a health care provider about:  Any allergies you have.  All medicines you are taking, including vitamins, herbs, eye drops, creams, and over-the-counter medicines.  Any problems you or family members have had with anesthetic medicines.  Any blood disorders you have.  Any surgeries you have had.  Any medical conditions you have.  Whether you are pregnant or may be pregnant. What happens before the procedure? No special preparation is needed. Eat and drink normally. What happens during the procedure?  In  order to produce an image of your heart, gel will be applied to your chest and a wand-like tool (transducer) will be moved over your chest. The gel will help transmit the sound waves from the transducer. The sound waves will harmlessly bounce off your heart to allow the heart images to be captured in real-time motion. These images will then be recorded.  You may need an IV to receive a medicine that improves the quality of the pictures. What happens after the procedure? You may return to your normal schedule including diet, activities, and medicines, unless your health care provider tells you otherwise. This information is not intended to replace advice given to you by your health care provider. Make sure you discuss any questions you have with your health care provider. Document Released: 02/29/2000 Document Revised: 10/20/2015 Document Reviewed: 11/08/2012 Elsevier Interactive Patient Education  2017 [employer].     If you need a refill on your cardiac medications before your next appointment, please call your pharmacy.

## 2016-09-19 NOTE — Telephone Encounter (Signed)
Patient to obtain an order from PCP to "lift chair" to help with LE edema and skin weeping.  [name] also shared that the patient is going to the [facility] to be seen for LE edema and weeping.

## 2016-09-20 LAB — URINALYSIS
BILIRUBIN UA: NEGATIVE
GLUCOSE, UA: NEGATIVE
KETONES UA: NEGATIVE
NITRITE UA: POSITIVE — AB
Protein, UA: NEGATIVE
RBC, UA: NEGATIVE
Specific Gravity, UA: 1.011 (ref 1.005–1.030)
UUROB: 1 mg/dL (ref 0.2–1.0)
pH, UA: 6 (ref 5.0–7.5)

## 2016-09-20 LAB — COMPREHENSIVE METABOLIC PANEL
ALBUMIN: 3.9 g/dL (ref 3.5–4.8)
ALT: 9 IU/L (ref 0–32)
AST: 14 IU/L (ref 0–40)
Albumin/Globulin Ratio: 1.4 (ref 1.2–2.2)
Alkaline Phosphatase: 134 IU/L — ABNORMAL HIGH (ref 39–117)
BUN / CREAT RATIO: 8 — AB (ref 12–28)
BUN: 7 mg/dL — AB (ref 8–27)
Bilirubin Total: 0.3 mg/dL (ref 0.0–1.2)
CALCIUM: 9.4 mg/dL (ref 8.7–10.3)
CO2: 28 mmol/L (ref 20–29)
CREATININE: 0.86 mg/dL (ref 0.57–1.00)
Chloride: 97 mmol/L (ref 96–106)
GFR calc non Af Amer: 67 mL/min/{1.73_m2} (ref >59)
GFR, EST AFRICAN AMERICAN: 77 mL/min/{1.73_m2} (ref >59)
GLUCOSE: 98 mg/dL (ref 65–99)
Globulin, Total: 2.8 g/dL (ref 1.5–4.5)
Potassium: 4.7 mmol/L (ref 3.5–5.2)
Sodium: 140 mmol/L (ref 134–144)
TOTAL PROTEIN: 6.7 g/dL (ref 6.0–8.5)

## 2016-09-20 LAB — PRO B NATRIURETIC PEPTIDE: NT-Pro BNP: 31 pg/mL (ref 0–301)

## 2016-09-22 ENCOUNTER — Telehealth: Payer: Self-pay | Admitting: Physician Assistant

## 2016-09-22 NOTE — Telephone Encounter (Signed)
Follow Up:   Pt said she just talked to a few minutes ago,she forgot to ask you another question.

## 2016-09-23 ENCOUNTER — Other Ambulatory Visit: Payer: Self-pay | Admitting: *Deleted

## 2016-09-23 ENCOUNTER — Other Ambulatory Visit: Payer: Self-pay

## 2016-09-23 DIAGNOSIS — I739 Peripheral vascular disease, unspecified: Secondary | ICD-10-CM

## 2016-09-23 DIAGNOSIS — S72145D Nondisplaced intertrochanteric fracture of left femur, subsequent encounter for closed fracture with routine healing: Secondary | ICD-10-CM

## 2016-09-26 ENCOUNTER — Encounter: Payer: Self-pay | Admitting: Physician Assistant

## 2016-09-29 ENCOUNTER — Encounter: Payer: Self-pay | Admitting: Infectious Diseases

## 2016-09-29 ENCOUNTER — Ambulatory Visit (INDEPENDENT_AMBULATORY_CARE_PROVIDER_SITE_OTHER): Payer: Medicare Other | Admitting: Infectious Diseases

## 2016-09-29 ENCOUNTER — Telehealth (HOSPITAL_COMMUNITY): Payer: Self-pay | Admitting: Physician Assistant

## 2016-09-29 DIAGNOSIS — L03115 Cellulitis of right lower limb: Secondary | ICD-10-CM

## 2016-09-29 NOTE — Progress Notes (Signed)
   Subjective:    Patient ID: Denise Kelly, female    DOB: 03/31/1940, 76 y.o.   MRN: [id number]  HPI 76 yo F with hx of L THR (11-2015) and R and L TKR (12-2013).  She comes to ED on 05-23-16 with hx of RLE cellulitis present since 02-2016. She had been hospitalized at that time with cellulitis.  She underwent MRI on 3-10 which suggested mild cellulitis, no osteo or abscess.  Cardiac echo (02-2016) showed nl ef and nl wall motion.  She has been on chronic keflex to try and suppress this.   She was ultimately d/c on keflex and then fell on 3-31. She was felt to be septic and was again treated with vanco/zosyn. She was d/c home on 4-3 on doxy for 5 days.  She resumed her keflex at d/c.   07-2016 underwent L4-5 fusion. She was sent to SNF and her keflex was d/c there. Her surgical wound has healed well.  After she got home she resumed her keflex until seen by PCP and given doxy.   She had worsening swelling and draining of her LE and was seen 08-27-16.  She was unable to be seen at [facility] due to no wound. She was reffered to lymphedema clinic but has not been able to find one yet.  She has had home care wound care dressing changes- unaboot, zinc, camomille.  Has not had as much drainage, dressings no longer wet.  Continue to take high dose of spironolactone (100mg /day). Was worried about her BP dropping. Frustrated with her PCP. Would like new, geriatrician.   Still taking keflex Taking miralax daily. Has been seen by [facility]- getting echo this week.   Review of Systems  Constitutional: Negative for chills and fever.  Gastrointestinal: Negative for constipation and diarrhea.  Genitourinary: Negative for difficulty urinating.       Objective:   Physical Exam  Constitutional: She appears well-developed and well-nourished.  Cardiovascular: Normal rate, regular rhythm and normal heart sounds.   Pulmonary/Chest: Effort normal and breath sounds normal.  Abdominal: Soft. Bowel sounds are  normal. There is no tenderness. There is no rebound.  Musculoskeletal: She exhibits edema.  LE dressed.   Lymphadenopathy:    She has no cervical adenopathy.          Assessment & Plan:

## 2016-09-29 NOTE — Assessment & Plan Note (Signed)
This appears to be resolved.  Her CV w/u was negative per family.  Has vascular surgery eval pending (8-17).  Will try to get her into lymphedema clinic.  Gave her names for new PCP.  Will stop keflex Will see her back prn

## 2016-09-30 ENCOUNTER — Other Ambulatory Visit: Payer: Self-pay | Admitting: Infectious Diseases

## 2016-09-30 ENCOUNTER — Telehealth: Payer: Self-pay | Admitting: *Deleted

## 2016-09-30 NOTE — Telephone Encounter (Signed)
Called pt and spoke with her about changing her appt to an earlier time due to the need for definity to be administered. She voiced that she would have to speak with her daughter because she took off work for this appt.

## 2016-09-30 NOTE — Telephone Encounter (Signed)
Patient notified that referral has been made to lymphedema clinic at [facility]. Referral and snapshot faxed. Other notes are in EPIC. Patient notified and given the phone # to check on this referral [phone]. Provider is [employer].

## 2016-10-01 ENCOUNTER — Other Ambulatory Visit: Payer: Self-pay

## 2016-10-01 ENCOUNTER — Ambulatory Visit (HOSPITAL_COMMUNITY): Payer: Medicare Other | Attending: Cardiology

## 2016-10-01 DIAGNOSIS — I10 Essential (primary) hypertension: Secondary | ICD-10-CM | POA: Diagnosis not present

## 2016-10-01 DIAGNOSIS — Z87891 Personal history of nicotine dependence: Secondary | ICD-10-CM | POA: Insufficient documentation

## 2016-10-01 DIAGNOSIS — R4182 Altered mental status, unspecified: Secondary | ICD-10-CM | POA: Diagnosis not present

## 2016-10-01 DIAGNOSIS — R600 Localized edema: Secondary | ICD-10-CM

## 2016-10-01 DIAGNOSIS — I444 Left anterior fascicular block: Secondary | ICD-10-CM | POA: Insufficient documentation

## 2016-10-01 MED ORDER — PERFLUTREN LIPID MICROSPHERE
1.0000 mL | INTRAVENOUS | Status: AC | PRN
  Administered 2016-10-01: 1 mL via INTRAVENOUS

## 2016-10-02 ENCOUNTER — Telehealth: Payer: Self-pay | Admitting: *Deleted

## 2016-10-02 NOTE — Telephone Encounter (Signed)
-----   Message from [place], [place] sent at 10/02/2016 12:36 PM EDT ----- Normal pumping function and mild stiffness of heart muscle. No significant changes from prior.

## 2016-10-02 NOTE — Telephone Encounter (Signed)
-----   Message from [place], [place] sent at 10/02/2016 12:36 PM EDT ----- Normal pumping function and mild stiffness of heart muscle. No significant changes from prior.

## 2016-10-02 NOTE — Telephone Encounter (Signed)
Follow up    Returning a call to the nurse to get echo results 

## 2016-10-02 NOTE — Telephone Encounter (Signed)
Returned pts call and she has been made aware of her echo results. She verbalized understanding. 

## 2016-10-02 NOTE — Addendum Note (Signed)
Addendum  created 10/02/16 1753 by Annye Asa, MD   Sign clinical note

## 2016-10-02 NOTE — Telephone Encounter (Signed)
Left a message for pt to call back re: echo results.

## 2016-10-22 ENCOUNTER — Encounter: Payer: Self-pay | Admitting: Vascular Surgery

## 2016-10-31 ENCOUNTER — Ambulatory Visit (HOSPITAL_COMMUNITY)
Admission: RE | Admit: 2016-10-31 | Discharge: 2016-10-31 | Disposition: A | Discharge status: Home / Self Care | Payer: Medicare Other | Source: Ambulatory Visit | Attending: Vascular Surgery | Admitting: Vascular Surgery

## 2016-10-31 ENCOUNTER — Ambulatory Visit (INDEPENDENT_AMBULATORY_CARE_PROVIDER_SITE_OTHER): Payer: Medicare Other | Admitting: Vascular Surgery

## 2016-10-31 ENCOUNTER — Encounter: Payer: Self-pay | Admitting: Vascular Surgery

## 2016-10-31 VITALS — BP 126/77 | HR 88 | Ht 67.0 in | Wt 202.1 lb

## 2016-10-31 DIAGNOSIS — I739 Peripheral vascular disease, unspecified: Secondary | ICD-10-CM

## 2016-10-31 NOTE — Progress Notes (Signed)
Requesting Physician: Dr. [name] CC: B LE pain   History of Present Illness:  Patient is a 76 y.o. year old female who presents for evaluation of Leg pain and hypersensitivity to touch right > left LE..  The patient currently describes pain that starts in the hip and radiates across the anterior thigh to the knee.  She has painful hypersensitivity to touch below the knees.  Chronic B LE edema that has improved in the past 6 months.  She has a history of lymph edema with weeping 6 months ago.  This was compounded with cellulitis that was managed with antibiotics and consults with the infectious Disease physicians at [facility].  No symptoms of claudication lower extremity. There is not rest pain.  There is no history of ulcerations on the feet.  Atherosclerotic risk factors and other medical problems include History of tobacco use, Left hip fracture, B TKA, and most recent L4-5 spinal decompression with fusion.  HTN, anxiety, Depression.  Past Medical History:  Diagnosis Date  . Alcohol abuse    ETOH and xanax  . Anxiety    Panic attacks   . Arthritis   . Cancer ([place])    skin- basal , squamous , melonoma- right hand  . Cellulitis    right leg  . Complication of anesthesia 2009   "felt drunk for a week after"- AVM- both times- felt drunk  . Depression   . Encephalopathy   . GERD (gastroesophageal reflux disease)   . HOH (hard of hearing)   . HOH (hard of hearing)   . Hypertension    not on mediacations  . Palpitations   . PONV (postoperative nausea and vomiting)   . Spondylolisthesis of lumbosacral region   . UTI (urinary tract infection)    frequently    Past Surgical History:  Procedure Laterality Date  . BREAST SURGERY Right    2 breast biopsies  . 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 COLONOSCOPY  2011   polyps  . EYE SURGERY Bilateral    cataracts  . INTRAMEDULLARY (IM) NAIL INTERTROCHANTERIC Left 11/29/2015   Procedure: LEFT HIP   NAIL;   Surgeon: Renette Butters, MD;  Location: [facility];  Service: Orthopedics;  Laterality: Left;  . JOINT REPLACEMENT Left 09/2009   knee  . LUMBAR FUSION  07/17/2016   Lumbar four-five Posterior lumbar interbody fusion (N/A)  . TONSILLECTOMY    . TOTAL KNEE ARTHROPLASTY Right 01/02/2014   Procedure: TOTAL KNEE ARTHROPLASTY;  Surgeon: [name], MD;  Location: [facility];  Service: Orthopedics;  Laterality: Right;  . TUBAL LIGATION      ROS:   General:  No weight loss, Fever, chills  HEENT: No recent headaches, no nasal bleeding, no visual changes, no sore throat  Neurologic: No dizziness, blackouts, seizures. No recent symptoms of stroke or mini- stroke. No recent episodes of slurred speech, or temporary blindness.  Cardiac: No recent episodes of chest pain/pressure, no shortness of breath at rest.  No shortness of breath with exertion.  Denies history of atrial fibrillation or irregular heartbeat  Vascular: No history of rest pain in feet.  No history of claudication.  No history of non-healing ulcer, No history of DVT   Pulmonary: No home oxygen, no productive cough, no hemoptysis,  No asthma or wheezing  Musculoskeletal:  [x ] Arthritis, [x ] Low back pain,  [x ] Joint pain  Hematologic:No history of hypercoagulable state.  No  history of easy bleeding.  No history of anemia  Gastrointestinal: No hematochezia or melena,  No gastroesophageal reflux, no trouble swallowing  Urinary: [ ]  chronic Kidney disease, [ ]  on HD - [ ]  MWF or [ ]  TTHS, [ ]  Burning with urination, [ ]  Frequent urination, [ ]  Difficulty urinating;   Skin: No rashes, Hx of cellulitis, lymphedema   Psychological: No history of anxiety,  No history of depression  Social History Social History  Substance Use Topics  . Smoking status: Former Smoker    Years: 20.00  . Smokeless tobacco: Never Used     Comment: quit smoking many years ago .  quit 1994  . Alcohol use No     Comment: unable to get alcohol per family  not since September    Family History Family History  Problem Relation Age of Onset  . Hypertension Other   . Cancer Mother   . Cancer Father     Allergies  Allergies  Allergen Reactions  . Atenolol Shortness Of Breath  . Propranolol Shortness Of Breath  . Sulfa Antibiotics Hives, Swelling and Other (See Comments)    Reaction:  Facial/eye swelling   . Coreg [Carvedilol] Other (See Comments)    Memory loss  . Motrin [Ibuprofen] Palpitations  . Toprol Xl [Metoprolol Tartrate] Cough  . Bactrim [Sulfamethoxazole-Trimethoprim] Swelling    SWELLING REACTION UNSPECIFIED   . Ciprofloxacin Itching  . Doxycycline Hyclate Other (See Comments)    HEARTBURN  . Flovent Hfa [Fluticasone] Itching  . Statins Other (See Comments)    MENTAL STATUS CHANGE     Current Outpatient Prescriptions  Medication Sig Dispense Refill  . acetaminophen (TYLENOL) 500 MG tablet Take 1,000 mg by mouth every 6 (six) hours as needed for mild pain, moderate pain or headache.     Marland Kitchen acidophilus (RISAQUAD) CAPS capsule Take 2 capsules by mouth daily. 30 capsule 0  . aspirin EC 81 MG tablet Take 81 mg by mouth at bedtime.     . cephALEXin (KEFLEX) 250 MG capsule Take 250 mg by mouth daily.    Marland Kitchen estradiol (ESTRACE) 0.1 MG/GM vaginal cream Place 1 Applicatorful vaginally at bedtime as needed (for vaginal discomfort).    . gabapentin (NEURONTIN) 100 MG capsule Take 200-300 mg by mouth 2 (two) times daily. 200 mg in the morning & 300 mg at bedtime    . hydrOXYzine (ATARAX/VISTARIL) 25 MG tablet Take 25 mg by mouth daily as needed for anxiety (and/or sleep).     . loratadine (CLARITIN) 10 MG tablet Take 10 mg by mouth 2 (two) times daily.     . methocarbamol (ROBAXIN) 500 MG tablet Take 250 mg by mouth daily as needed (for leg cramps/spasms.).     Marland Kitchen ondansetron (ZOFRAN-ODT) 4 MG disintegrating tablet Take 4 mg by mouth every 6 (six) hours as needed for nausea or vomiting.     Vladimir Faster Glycol-Propyl Glycol (SYSTANE  OP) Apply 1-2 drops to eye 3 (three) times daily as needed (for dry eyes).    . pramipexole (MIRAPEX) 0.125 MG tablet Take 0.375 mg by mouth 2 (two) times daily as needed (for restless leg syndrome).     Marland Kitchen senna-docusate (SENOKOT S) 8.6-50 MG tablet Take 1 tablet by mouth 2 (two) times daily.    Marland Kitchen spironolactone (ALDACTONE) 100 MG tablet Take 100 mg by mouth daily.    . traMADol (ULTRAM) 50 MG tablet Take 1 tablet (50 mg total) by mouth every 6 (six) hours as needed for  moderate pain. 20 tablet 0  . traZODone (DESYREL) 50 MG tablet Take 100 mg by mouth daily as needed for sleep.    . citalopram (CELEXA) 20 MG tablet Take 1 tablet (20 mg total) by mouth at bedtime. (Patient not taking: Reported on 09/29/2016) 30 tablet 1  . folic acid (FOLVITE) 1 MG tablet Take 1 tablet (1 mg total) by mouth daily. (Patient not taking: Reported on 09/29/2016)    . oxyCODONE (OXY IR/ROXICODONE) 5 MG immediate release tablet Take 1 tablet (5 mg total) by mouth every 6 (six) hours as needed for severe pain. (Patient not taking: Reported on 09/29/2016) 15 tablet 0   No current facility-administered medications for this visit.     Physical Examination  Vitals:   10/31/16 1333  BP: 126/77  Pulse: 88  Weight: 202 lb 1.6 oz (91.7 kg)  Height: 5\' 7"  (1.702 m)    Body mass index is 31.65 kg/m.  General:  Alert and oriented, no acute distress HEENT: Normal Neck: No bruit or JVD Pulmonary: Clear to auscultation bilaterally Cardiac: Regular Rate and Rhythm without murmur Abdomen: Soft, non-tender, non-distended, no mass, no scars Skin: No rash Extremity Pulses:  2+ radial, brachial, dorsalis pedis,  pulses bilaterally Musculoskeletal: No deformity or edema  Neurologic: Upper and lower extremity motor 5/5 and symmetric  DATA: I have independently interpreted her bilateral lower extremity arterial duplexes and ABIs. Arterial Duplex with calcified arteries high ABI's PT/DP B normal triphasic flow   ASSESSMENT:    Bilateral leg pain from hips down unknown cause  Mild Calcified PAD B Normal arterial blood flow    PLAN:  She has palpable pulses without symptoms of claudication or ischemia.  She has chronic edema and states she can't put on compression hose due to her inability to reach her feet and lack of hand strength.  We recommend water therapy which will help with the edema, [employer] chair aerobics for activity. She may benefit from increasing her Neurontin to aide in the LE pain symptoms.  Her symptoms are not related to her arterial blood flow.    COLLINS, EMMA MAUREEN PA-C [facility]   I have interviewed and examined patient with PA and agree with assessment and plan above. No evidence of arterial insufficiency to cause pain and doubtful that venous evaluation in intervention would help her. We have recommended compression stockings as well as exercises particularly water aerobics. She demonstrates very good understanding.  Cal Gindlesperger C. Donzetta Matters, MD [facility] Office: [phone] Pager: [phone]

## 2016-11-05 ENCOUNTER — Ambulatory Visit: Payer: Medicare Other | Admitting: Occupational Therapy

## 2016-11-21 ENCOUNTER — Ambulatory Visit: Payer: Self-pay | Admitting: Internal Medicine

## 2016-12-01 ENCOUNTER — Other Ambulatory Visit: Payer: Self-pay | Admitting: Neurosurgery

## 2016-12-01 DIAGNOSIS — M5440 Lumbago with sciatica, unspecified side: Secondary | ICD-10-CM

## 2016-12-02 ENCOUNTER — Encounter (HOSPITAL_COMMUNITY): Payer: Self-pay

## 2016-12-02 ENCOUNTER — Encounter: Payer: Self-pay | Admitting: Vascular Surgery

## 2016-12-05 ENCOUNTER — Other Ambulatory Visit: Payer: Self-pay

## 2016-12-09 ENCOUNTER — Ambulatory Visit
Admission: RE | Admit: 2016-12-09 | Discharge: 2016-12-09 | Disposition: A | Discharge status: Home / Self Care | Payer: Medicare Other | Source: Ambulatory Visit | Attending: Neurosurgery | Admitting: Neurosurgery

## 2016-12-09 DIAGNOSIS — M5440 Lumbago with sciatica, unspecified side: Secondary | ICD-10-CM

## 2016-12-12 ENCOUNTER — Other Ambulatory Visit: Payer: Self-pay

## 2017-04-21 ENCOUNTER — Ambulatory Visit: Payer: Self-pay | Admitting: Physician Assistant

## 2017-04-24 ENCOUNTER — Ambulatory Visit: Payer: Self-pay | Admitting: Physician Assistant

## 2017-04-28 ENCOUNTER — Encounter: Payer: Self-pay | Admitting: Physician Assistant

## 2017-04-28 ENCOUNTER — Other Ambulatory Visit: Payer: Self-pay

## 2017-04-28 ENCOUNTER — Ambulatory Visit (INDEPENDENT_AMBULATORY_CARE_PROVIDER_SITE_OTHER): Payer: Medicare Other | Admitting: Physician Assistant

## 2017-04-28 VITALS — BP 106/70 | HR 96 | Temp 98.1°F | Resp 18 | Ht 67.0 in | Wt 187.4 lb

## 2017-04-28 DIAGNOSIS — G894 Chronic pain syndrome: Secondary | ICD-10-CM | POA: Diagnosis not present

## 2017-04-28 DIAGNOSIS — G2581 Restless legs syndrome: Secondary | ICD-10-CM

## 2017-04-28 DIAGNOSIS — N390 Urinary tract infection, site not specified: Secondary | ICD-10-CM

## 2017-04-28 DIAGNOSIS — F3289 Other specified depressive episodes: Secondary | ICD-10-CM | POA: Diagnosis not present

## 2017-04-28 DIAGNOSIS — R937 Abnormal findings on diagnostic imaging of other parts of musculoskeletal system: Secondary | ICD-10-CM | POA: Diagnosis not present

## 2017-04-28 DIAGNOSIS — Z7689 Persons encountering health services in other specified circumstances: Secondary | ICD-10-CM

## 2017-04-28 DIAGNOSIS — I10 Essential (primary) hypertension: Secondary | ICD-10-CM

## 2017-04-28 DIAGNOSIS — R2681 Unsteadiness on feet: Secondary | ICD-10-CM | POA: Insufficient documentation

## 2017-04-28 NOTE — Patient Instructions (Addendum)
[facility] - on [address] - help with transportation  [employer] - silver sneakers - water therapy -- talk [facility] physical therapy  [employer] [facility] - for social interaction  [facility] for an appt for mammo and bone density  IF you received an x-ray today, you will receive an invoice from [facility]. Please contact [facility] at [phone] with questions or concerns regarding your invoice.   IF you received labwork today, you will receive an invoice from [facility]. Please contact [facility] at [phone] with questions or concerns regarding your invoice.   Our billing staff will not be able to assist you with questions regarding bills from these companies.  You will be contacted with the lab results as soon as they are available. The fastest way to get your results is to activate your My Chart account. Instructions are located on the last page of this paperwork. If you have not heard from [place] regarding the results in 2 weeks, please contact this office.

## 2017-04-28 NOTE — Progress Notes (Signed)
Denise Kelly  MRN: [id number] DOB: 1940/08/25  PCP: No primary care provider on file.  Chief Complaint  Patient presents with  . Establish Care    Subjective:  Pt presents to clinic for looking for a new PCP.  Old PCP - Dr Justin Mend at [facility] -- she is not sure she wants to stay at this practice  Currently on walker due to balance - she is in PT at [place] for this - she is unable to drive as a result  Chronic pain in her back -  See Dr Maryjean Ka - trying to figure out what it is - she thinks piriformis syndrome and that is what she is hoping PT will help her with -pain is on her right side in her buttocks and then down to her knee on her lateral leg  Social isolation - lives alone - currently not able to drive - not a lot of friends  History is obtained by patient.  Review of Systems  Neurological: Light-headedness: none with change in position.    Patient Active Problem List   Diagnosis Date Noted  . Spondylolisthesis - sees Dr Lovenia Shuck 07/17/2016  . Bilateral lower extremity edema -  07/17/2016  . Chronic pain syndrome - sees Dr Lovenia Shuck   . Depression - Sees psychiatry - on medications 05/23/2016  . Chronic back pain 02/25/2016  . Lesion of liver - get records 02/25/2016  . History of fracture of left hip - 2017 -  11/28/2015  . Fall - due to balance issues 11/28/2015  . RLS (restless legs syndrome) - takes medication rarely - only when needed 11/28/2015  . Confusion - unsure if dementia or medication related 03/14/2014  . Recurrent UTI - on daily keflex for prevention 03/14/2014  . Effusion of right knee 02/22/2014  . Alcohol dependence ([place]) - none in a year 02/22/2014  . Substance induced mood disorder ([place]) 02/22/2014  . Hepatic encephalopathy ([place])   . Altered mental status 02/21/2014  . SIRS (systemic inflammatory response syndrome) ([place]) 02/21/2014  . S/P total knee arthroplasty 01/02/2014  . Essential hypertension, benign 11/10/2013  . LAFB (left anterior fascicular  block) - sees cardiology 11/10/2013  . Obesity, unspecified 11/10/2013    Current Outpatient Medications on File Prior to Visit  Medication Sig Dispense Refill  . acetaminophen (TYLENOL) 500 MG tablet Take 1,000 mg by mouth every 6 (six) hours as needed for mild pain, moderate pain or headache.     Marland Kitchen acidophilus (RISAQUAD) CAPS capsule Take 2 capsules by mouth daily. 30 capsule 0  . aspirin EC 81 MG tablet Take 81 mg by mouth at bedtime.     . cephALEXin (KEFLEX) 250 MG capsule Take 250 mg by mouth daily.    Marland Kitchen estradiol (ESTRACE) 0.1 MG/GM vaginal cream Place 1 Applicatorful vaginally at bedtime as needed (for vaginal discomfort).    . gabapentin (NEURONTIN) 100 MG capsule Take 200-300 mg by mouth 2 (two) times daily. 200 mg in the morning & 300 mg at bedtime    . hydrOXYzine (ATARAX/VISTARIL) 25 MG tablet Take 25 mg by mouth daily as needed for anxiety (and/or sleep).     . loratadine (CLARITIN) 10 MG tablet Take 10 mg by mouth 2 (two) times daily.     . methocarbamol (ROBAXIN) 500 MG tablet Take 250 mg by mouth daily as needed (for leg cramps/spasms.).     Marland Kitchen ondansetron (ZOFRAN-ODT) 4 MG disintegrating tablet Take 4 mg by mouth every 6 (six) hours as needed  for nausea or vomiting.     Marland Kitchen oxyCODONE (OXY IR/ROXICODONE) 5 MG immediate release tablet Take 1 tablet (5 mg total) by mouth every 6 (six) hours as needed for severe pain. (Patient taking differently: Take 10 mg by mouth every 8 (eight) hours as needed for severe pain. ) 15 tablet 0  . Polyethyl Glycol-Propyl Glycol (SYSTANE OP) Apply 1-2 drops to eye 3 (three) times daily as needed (for dry eyes).    . pramipexole (MIRAPEX) 0.125 MG tablet Take 0.375 mg by mouth 2 (two) times daily as needed (for restless leg syndrome).     Marland Kitchen senna-docusate (SENOKOT S) 8.6-50 MG tablet Take 1 tablet by mouth 2 (two) times daily.    Marland Kitchen spironolactone (ALDACTONE) 100 MG tablet Take 100 mg by mouth daily.    . traMADol (ULTRAM) 50 MG tablet Take 1 tablet (50  mg total) by mouth every 6 (six) hours as needed for moderate pain. 20 tablet 0  . traZODone (DESYREL) 50 MG tablet Take 100 mg by mouth daily as needed for sleep.     No current facility-administered medications on file prior to visit.     Allergies  Allergen Reactions  . Sulfa Antibiotics Hives, Swelling and Other (See Comments)    Reaction:  Facial/eye swelling   . Coreg [Carvedilol] Other (See Comments)    Memory loss  . Motrin [Ibuprofen] Palpitations  . Toprol Xl [Metoprolol Tartrate] Cough  . Doxycycline Hyclate Other (See Comments)    HEARTBURN  . Flovent Hfa [Fluticasone] Itching  . Statins Other (See Comments)    MENTAL STATUS CHANGE    Past Medical History:  Diagnosis Date  . Alcohol abuse    ETOH and xanax  . Anxiety    Panic attacks   . Arthritis   . Cancer ([place])    skin- basal , squamous , melonoma- right hand  . Cellulitis    right leg  . Complication of anesthesia 2009   "felt drunk for a week after"- AVM- both times- felt drunk  . Depression   . Encephalopathy   . GERD (gastroesophageal reflux disease)   . HOH (hard of hearing)   . HOH (hard of hearing)   . Hypertension    not on mediacations  . Palpitations   . PONV (postoperative nausea and vomiting)   . Spondylolisthesis of lumbosacral region   . UTI (urinary tract infection)    frequently   Social History   Social History Narrative   Lives alone   - daughter - paramedic   Social History   Tobacco Use  . Smoking status: Former Smoker    Packs/day: 1.00    Years: 20.00    Pack years: 20.00    Last attempt to quit: 03/17/1992    Years since quitting: 25.1  . Smokeless tobacco: Never Used  . Tobacco comment: quit smoking many years ago .  quit 1994  Substance Use Topics  . Alcohol use: No    Comment: unable to get alcohol per family not since September  . Drug use: No   family history includes Cancer in her father and mother; Hypertension in her other.     Objective:  BP 106/70    Pulse 96   Temp 98.1 F (36.7 C) (Oral)   Resp 18   Ht 5\' 7"  (1.702 m)   Wt 187 lb 6.4 oz (85 kg)   SpO2 95%   BMI 29.35 kg/m  Body mass index is 29.35 kg/m.  Physical Exam  Constitutional: She is oriented to person, place, and time and well-developed, well-nourished, and in no distress.  HENT:  Head: Normocephalic and atraumatic.  Right Ear: Hearing and external ear normal.  Left Ear: Hearing and external ear normal.  Eyes: Conjunctivae are normal.  Neck: Normal range of motion.  Cardiovascular: Normal rate, regular rhythm and normal heart sounds.  No murmur heard. Pulmonary/Chest: Effort normal and breath sounds normal. She has no wheezes.  Neurological: She is alert and oriented to person, place, and time. Gait (with a walker) abnormal.  Skin: Skin is warm and dry.  Psychiatric: Mood, memory and judgment normal. She does not express impulsivity. She exhibits ordered thought content. She has a flat affect.  Pt uses phone to gather a lot of information - remote and recent memory seems to be affected  Vitals reviewed.   Assessment and Plan :  Essential hypertension  -  Controlled on medications  Chronic pain syndrome - sees pain specialist for medications  Recurrent UTI - currently on Keflex for prevention - she currently does not need  Other depression - see specialist - continue her care there  RLS (restless legs syndrome) - does not currently need medications -   Unsteady - continue to therapy - encouraged pool therapy as a way to allow her to move and strength her bodyF/u in 2-3 months for medicare annual wellness    Windell Hummingbird PA-C  [facility] 04/28/2017 2:40 PM

## 2017-05-07 ENCOUNTER — Telehealth: Payer: Self-pay | Admitting: Physician Assistant

## 2017-05-07 NOTE — Telephone Encounter (Signed)
Called pt to inform her of the denial from [facility]. They are denying her request due to "DOB required on all requests for medical records". We clearly see that she has put her DOB on the request so we aren't sure why they are denying the request.  Advised pt to call [facility] and have them fax over her records directly to [place].

## 2017-09-27 ENCOUNTER — Encounter (HOSPITAL_COMMUNITY): Payer: Self-pay | Admitting: Nurse Practitioner

## 2017-09-27 ENCOUNTER — Inpatient Hospital Stay (HOSPITAL_COMMUNITY)
Admission: EM | Admit: 2017-09-27 | Discharge: 2017-10-03 | DRG: 071 | Disposition: A | Discharge status: Skilled Nursing Facility | Payer: Medicare Other | Attending: Family Medicine | Admitting: Family Medicine

## 2017-09-27 DIAGNOSIS — T40605A Adverse effect of unspecified narcotics, initial encounter: Secondary | ICD-10-CM | POA: Diagnosis present

## 2017-09-27 DIAGNOSIS — G9349 Other encephalopathy: Principal | ICD-10-CM | POA: Diagnosis present

## 2017-09-27 DIAGNOSIS — F10239 Alcohol dependence with withdrawal, unspecified: Secondary | ICD-10-CM | POA: Diagnosis present

## 2017-09-27 DIAGNOSIS — G9050 Complex regional pain syndrome I, unspecified: Secondary | ICD-10-CM | POA: Diagnosis present

## 2017-09-27 DIAGNOSIS — N390 Urinary tract infection, site not specified: Secondary | ICD-10-CM | POA: Diagnosis present

## 2017-09-27 DIAGNOSIS — E876 Hypokalemia: Secondary | ICD-10-CM | POA: Diagnosis not present

## 2017-09-27 DIAGNOSIS — K5900 Constipation, unspecified: Secondary | ICD-10-CM

## 2017-09-27 DIAGNOSIS — B9689 Other specified bacterial agents as the cause of diseases classified elsewhere: Secondary | ICD-10-CM | POA: Diagnosis present

## 2017-09-27 DIAGNOSIS — Z882 Allergy status to sulfonamides status: Secondary | ICD-10-CM

## 2017-09-27 DIAGNOSIS — Z7983 Long term (current) use of bisphosphonates: Secondary | ICD-10-CM

## 2017-09-27 DIAGNOSIS — Z792 Long term (current) use of antibiotics: Secondary | ICD-10-CM

## 2017-09-27 DIAGNOSIS — K5903 Drug induced constipation: Secondary | ICD-10-CM | POA: Diagnosis present

## 2017-09-27 DIAGNOSIS — Z1619 Resistance to other specified beta lactam antibiotics: Secondary | ICD-10-CM | POA: Diagnosis present

## 2017-09-27 DIAGNOSIS — G9340 Encephalopathy, unspecified: Secondary | ICD-10-CM | POA: Diagnosis not present

## 2017-09-27 DIAGNOSIS — F1020 Alcohol dependence, uncomplicated: Secondary | ICD-10-CM | POA: Diagnosis present

## 2017-09-27 DIAGNOSIS — Z888 Allergy status to other drugs, medicaments and biological substances status: Secondary | ICD-10-CM

## 2017-09-27 DIAGNOSIS — Z9851 Tubal ligation status: Secondary | ICD-10-CM

## 2017-09-27 DIAGNOSIS — Z79891 Long term (current) use of opiate analgesic: Secondary | ICD-10-CM

## 2017-09-27 DIAGNOSIS — I10 Essential (primary) hypertension: Secondary | ICD-10-CM | POA: Diagnosis present

## 2017-09-27 DIAGNOSIS — K5901 Slow transit constipation: Secondary | ICD-10-CM | POA: Diagnosis present

## 2017-09-27 DIAGNOSIS — Z886 Allergy status to analgesic agent status: Secondary | ICD-10-CM

## 2017-09-27 DIAGNOSIS — Z981 Arthrodesis status: Secondary | ICD-10-CM

## 2017-09-27 DIAGNOSIS — K625 Hemorrhage of anus and rectum: Secondary | ICD-10-CM

## 2017-09-27 DIAGNOSIS — R1084 Generalized abdominal pain: Secondary | ICD-10-CM

## 2017-09-27 DIAGNOSIS — Z85820 Personal history of malignant melanoma of skin: Secondary | ICD-10-CM

## 2017-09-27 DIAGNOSIS — I890 Lymphedema, not elsewhere classified: Secondary | ICD-10-CM | POA: Diagnosis present

## 2017-09-27 DIAGNOSIS — Z96653 Presence of artificial knee joint, bilateral: Secondary | ICD-10-CM | POA: Diagnosis present

## 2017-09-27 DIAGNOSIS — Z8249 Family history of ischemic heart disease and other diseases of the circulatory system: Secondary | ICD-10-CM

## 2017-09-27 DIAGNOSIS — Z79890 Hormone replacement therapy (postmenopausal): Secondary | ICD-10-CM

## 2017-09-27 DIAGNOSIS — Z87440 Personal history of urinary (tract) infections: Secondary | ICD-10-CM

## 2017-09-27 DIAGNOSIS — Z7982 Long term (current) use of aspirin: Secondary | ICD-10-CM

## 2017-09-27 DIAGNOSIS — Z881 Allergy status to other antibiotic agents status: Secondary | ICD-10-CM

## 2017-09-27 DIAGNOSIS — R4182 Altered mental status, unspecified: Secondary | ICD-10-CM

## 2017-09-27 DIAGNOSIS — Z66 Do not resuscitate: Secondary | ICD-10-CM | POA: Diagnosis present

## 2017-09-27 DIAGNOSIS — Y92009 Unspecified place in unspecified non-institutional (private) residence as the place of occurrence of the external cause: Secondary | ICD-10-CM

## 2017-09-27 DIAGNOSIS — G8929 Other chronic pain: Secondary | ICD-10-CM | POA: Diagnosis present

## 2017-09-27 DIAGNOSIS — F329 Major depressive disorder, single episode, unspecified: Secondary | ICD-10-CM | POA: Diagnosis present

## 2017-09-27 DIAGNOSIS — M549 Dorsalgia, unspecified: Secondary | ICD-10-CM | POA: Diagnosis present

## 2017-09-27 DIAGNOSIS — H9190 Unspecified hearing loss, unspecified ear: Secondary | ICD-10-CM | POA: Diagnosis present

## 2017-09-27 DIAGNOSIS — Z96642 Presence of left artificial hip joint: Secondary | ICD-10-CM | POA: Diagnosis present

## 2017-09-27 NOTE — ED Triage Notes (Signed)
Pt is presented from home with c/o rectal pain and constipation. Reports that typically has to take laxatives to help with BM but has for some reason been unable to take them this week. Pt appears somewhat in confused and guarding her abdomen.

## 2017-09-27 NOTE — ED Notes (Signed)
Bed: WA04 Expected date:  Expected time:  Means of arrival:  Comments: EMS 

## 2017-09-28 ENCOUNTER — Emergency Department (HOSPITAL_COMMUNITY): Payer: Medicare Other

## 2017-09-28 ENCOUNTER — Observation Stay (HOSPITAL_COMMUNITY): Payer: Medicare Other

## 2017-09-28 ENCOUNTER — Other Ambulatory Visit: Payer: Self-pay

## 2017-09-28 ENCOUNTER — Encounter (HOSPITAL_COMMUNITY): Payer: Self-pay | Admitting: Radiology

## 2017-09-28 DIAGNOSIS — F1020 Alcohol dependence, uncomplicated: Secondary | ICD-10-CM

## 2017-09-28 DIAGNOSIS — G9340 Encephalopathy, unspecified: Secondary | ICD-10-CM | POA: Diagnosis not present

## 2017-09-28 DIAGNOSIS — K5901 Slow transit constipation: Secondary | ICD-10-CM | POA: Diagnosis present

## 2017-09-28 LAB — POC OCCULT BLOOD, ED
FECAL OCCULT BLD: NEGATIVE
FECAL OCCULT BLD: POSITIVE — AB

## 2017-09-28 LAB — COMPREHENSIVE METABOLIC PANEL
ALBUMIN: 3.5 g/dL (ref 3.5–5.0)
ALT: 13 U/L (ref 0–44)
ANION GAP: 10 (ref 5–15)
AST: 22 U/L (ref 15–41)
Alkaline Phosphatase: 95 U/L (ref 38–126)
BUN: 8 mg/dL (ref 8–23)
CALCIUM: 9.4 mg/dL (ref 8.9–10.3)
CO2: 30 mmol/L (ref 22–32)
Chloride: 101 mmol/L (ref 98–111)
Creatinine, Ser: 0.76 mg/dL (ref 0.44–1.00)
GFR calc non Af Amer: >60 mL/min (ref >60)
GLUCOSE: 143 mg/dL — AB (ref 70–99)
Potassium: 4.5 mmol/L (ref 3.5–5.1)
SODIUM: 141 mmol/L (ref 135–145)
Total Bilirubin: 0.8 mg/dL (ref 0.3–1.2)
Total Protein: 7 g/dL (ref 6.5–8.1)

## 2017-09-28 LAB — CBC
HCT: 44.7 % (ref 36.0–46.0)
HEMOGLOBIN: 14.8 g/dL (ref 12.0–15.0)
MCH: 28.6 pg (ref 26.0–34.0)
MCHC: 33.1 g/dL (ref 30.0–36.0)
MCV: 86.5 fL (ref 78.0–100.0)
Platelets: 196 10*3/uL (ref 150–400)
RBC: 5.17 MIL/uL — ABNORMAL HIGH (ref 3.87–5.11)
RDW: 12.7 % (ref 11.5–15.5)
WBC: 14.7 10*3/uL — ABNORMAL HIGH (ref 4.0–10.5)

## 2017-09-28 LAB — URINALYSIS, ROUTINE W REFLEX MICROSCOPIC
BILIRUBIN URINE: NEGATIVE
Glucose, UA: NEGATIVE mg/dL
HGB URINE DIPSTICK: NEGATIVE
KETONES UR: NEGATIVE mg/dL
NITRITE: POSITIVE — AB
PH: 9 — AB (ref 5.0–8.0)
Protein, ur: NEGATIVE mg/dL
SPECIFIC GRAVITY, URINE: 1.009 (ref 1.005–1.030)

## 2017-09-28 LAB — RAPID URINE DRUG SCREEN, HOSP PERFORMED
Amphetamines: NOT DETECTED
Benzodiazepines: NOT DETECTED
COCAINE: NOT DETECTED
Opiates: NOT DETECTED
Tetrahydrocannabinol: NOT DETECTED

## 2017-09-28 LAB — CBC WITH DIFFERENTIAL/PLATELET
BASOS PCT: 0 %
Basophils Absolute: 0 10*3/uL (ref 0.0–0.1)
Eosinophils Absolute: 0 10*3/uL (ref 0.0–0.7)
Eosinophils Relative: 0 %
HEMATOCRIT: 46.3 % — AB (ref 36.0–46.0)
HEMOGLOBIN: 15.9 g/dL — AB (ref 12.0–15.0)
LYMPHS ABS: 1.3 10*3/uL (ref 0.7–4.0)
Lymphocytes Relative: 11 %
MCH: 29.8 pg (ref 26.0–34.0)
MCHC: 34.3 g/dL (ref 30.0–36.0)
MCV: 86.7 fL (ref 78.0–100.0)
MONO ABS: 0.8 10*3/uL (ref 0.1–1.0)
MONOS PCT: 6 %
NEUTROS PCT: 83 %
Neutro Abs: 9.8 10*3/uL — ABNORMAL HIGH (ref 1.7–7.7)
Platelets: 200 10*3/uL (ref 150–400)
RBC: 5.34 MIL/uL — ABNORMAL HIGH (ref 3.87–5.11)
RDW: 12.7 % (ref 11.5–15.5)
WBC: 11.9 10*3/uL — ABNORMAL HIGH (ref 4.0–10.5)

## 2017-09-28 LAB — I-STAT CG4 LACTIC ACID, ED: Lactic Acid, Venous: 1.86 mmol/L (ref 0.5–1.9)

## 2017-09-28 LAB — OCCULT BLOOD, POC DEVICE: Fecal Occult Bld: NEGATIVE

## 2017-09-28 LAB — AMMONIA: Ammonia: 13 umol/L (ref 9–35)

## 2017-09-28 LAB — TYPE AND SCREEN
ABO/RH(D): O NEG
Antibody Screen: NEGATIVE

## 2017-09-28 LAB — LIPASE, BLOOD: Lipase: 22 U/L (ref 11–51)

## 2017-09-28 MED ORDER — SODIUM CHLORIDE 0.9 % IV BOLUS
1000.0000 mL | Freq: Once | INTRAVENOUS | Status: AC
  Administered 2017-09-28: 1000 mL via INTRAVENOUS

## 2017-09-28 MED ORDER — POLYETHYLENE GLYCOL 3350 17 G PO PACK
17.0000 g | PACK | Freq: Every day | ORAL | Status: DC
  Administered 2017-09-28: 17 g via ORAL
  Filled 2017-09-28: qty 1

## 2017-09-28 MED ORDER — ASPIRIN EC 81 MG PO TBEC
81.0000 mg | DELAYED_RELEASE_TABLET | Freq: Every day | ORAL | Status: DC
  Administered 2017-09-29 – 2017-10-02 (×4): 81 mg via ORAL
  Filled 2017-09-28 (×5): qty 1

## 2017-09-28 MED ORDER — LORAZEPAM 2 MG/ML IJ SOLN: 0.0000 mg | Freq: Two times a day (BID) | INTRAMUSCULAR | Status: DC

## 2017-09-28 MED ORDER — METRONIDAZOLE IN NACL 5-0.79 MG/ML-% IV SOLN
500.0000 mg | Freq: Three times a day (TID) | INTRAVENOUS | Status: DC
  Administered 2017-09-28 – 2017-09-30 (×7): 500 mg via INTRAVENOUS
  Filled 2017-09-28 (×7): qty 100

## 2017-09-28 MED ORDER — ACETAMINOPHEN 650 MG RE SUPP: 650.0000 mg | Freq: Four times a day (QID) | RECTAL | Status: DC | PRN

## 2017-09-28 MED ORDER — LORAZEPAM 2 MG/ML IJ SOLN
1.0000 mg | Freq: Four times a day (QID) | INTRAMUSCULAR | Status: DC | PRN
  Administered 2017-09-28 (×2): 1 mg via INTRAVENOUS
  Filled 2017-09-28 (×2): qty 1

## 2017-09-28 MED ORDER — SODIUM CHLORIDE 0.9 % IV SOLN
1.0000 g | INTRAVENOUS | Status: DC
  Administered 2017-09-29 – 2017-09-30 (×3): 1 g via INTRAVENOUS
  Filled 2017-09-28 (×3): qty 1

## 2017-09-28 MED ORDER — IOPAMIDOL (ISOVUE-300) INJECTION 61%
INTRAVENOUS | Status: AC
  Filled 2017-09-28: qty 100

## 2017-09-28 MED ORDER — GABAPENTIN 100 MG PO CAPS
200.0000 mg | ORAL_CAPSULE | Freq: Every day | ORAL | Status: DC
  Administered 2017-09-28 – 2017-10-02 (×4): 200 mg via ORAL
  Filled 2017-09-28 (×5): qty 2

## 2017-09-28 MED ORDER — ONDANSETRON HCL 4 MG PO TABS
4.0000 mg | ORAL_TABLET | Freq: Four times a day (QID) | ORAL | Status: DC | PRN
  Administered 2017-10-01 – 2017-10-03 (×6): 4 mg via ORAL
  Filled 2017-09-28 (×6): qty 1

## 2017-09-28 MED ORDER — THIAMINE HCL 100 MG/ML IJ SOLN
100.0000 mg | Freq: Every day | INTRAMUSCULAR | Status: DC
  Administered 2017-09-28: 100 mg via INTRAVENOUS
  Filled 2017-09-28 (×2): qty 2

## 2017-09-28 MED ORDER — FOLIC ACID 1 MG PO TABS
1.0000 mg | ORAL_TABLET | Freq: Every day | ORAL | Status: DC
  Administered 2017-09-28 – 2017-10-01 (×3): 1 mg via ORAL
  Filled 2017-09-28 (×3): qty 1

## 2017-09-28 MED ORDER — HYDROMORPHONE HCL 1 MG/ML IJ SOLN
0.5000 mg | Freq: Once | INTRAMUSCULAR | Status: AC
  Administered 2017-09-28: 0.5 mg via INTRAVENOUS
  Filled 2017-09-28: qty 1

## 2017-09-28 MED ORDER — ADULT MULTIVITAMIN W/MINERALS CH
1.0000 | ORAL_TABLET | Freq: Every day | ORAL | Status: DC
  Administered 2017-09-28 – 2017-10-01 (×3): 1 via ORAL
  Filled 2017-09-28 (×3): qty 1

## 2017-09-28 MED ORDER — LORAZEPAM 2 MG/ML IJ SOLN
0.0000 mg | Freq: Four times a day (QID) | INTRAMUSCULAR | Status: DC
  Administered 2017-09-28 (×3): 2 mg via INTRAVENOUS
  Administered 2017-09-29: 1 mg via INTRAVENOUS
  Administered 2017-09-29 (×3): 2 mg via INTRAVENOUS
  Filled 2017-09-28 (×7): qty 1

## 2017-09-28 MED ORDER — BISACODYL 5 MG PO TBEC: 5.0000 mg | DELAYED_RELEASE_TABLET | Freq: Every day | ORAL | Status: DC | PRN

## 2017-09-28 MED ORDER — VITAMIN B-1 100 MG PO TABS
100.0000 mg | ORAL_TABLET | Freq: Every day | ORAL | Status: DC
  Administered 2017-09-30 – 2017-10-02 (×3): 100 mg via ORAL
  Filled 2017-09-28 (×3): qty 1

## 2017-09-28 MED ORDER — OXYCODONE HCL 5 MG PO TABS: 10.0000 mg | ORAL_TABLET | Freq: Three times a day (TID) | ORAL | Status: DC | PRN

## 2017-09-28 MED ORDER — METHOCARBAMOL 500 MG PO TABS: 250.0000 mg | ORAL_TABLET | Freq: Every day | ORAL | Status: DC | PRN

## 2017-09-28 MED ORDER — SPIRONOLACTONE 25 MG PO TABS
50.0000 mg | ORAL_TABLET | Freq: Two times a day (BID) | ORAL | Status: DC
  Administered 2017-09-28 – 2017-10-03 (×9): 50 mg via ORAL
  Filled 2017-09-28 (×10): qty 2

## 2017-09-28 MED ORDER — TRAZODONE HCL 100 MG PO TABS: 100.0000 mg | ORAL_TABLET | Freq: Every day | ORAL | Status: DC | PRN

## 2017-09-28 MED ORDER — LORAZEPAM 1 MG PO TABS
1.0000 mg | ORAL_TABLET | Freq: Four times a day (QID) | ORAL | Status: DC | PRN
  Administered 2017-09-30: 1 mg via ORAL
  Filled 2017-09-28: qty 1

## 2017-09-28 MED ORDER — GABAPENTIN 300 MG PO CAPS
300.0000 mg | ORAL_CAPSULE | Freq: Every day | ORAL | Status: DC
  Administered 2017-09-29 – 2017-09-30 (×2): 300 mg via ORAL
  Filled 2017-09-28 (×3): qty 1

## 2017-09-28 MED ORDER — GABAPENTIN 100 MG PO CAPS: 200.0000 mg | ORAL_CAPSULE | Freq: Two times a day (BID) | ORAL | Status: DC

## 2017-09-28 MED ORDER — POLYETHYLENE GLYCOL 3350 17 G PO PACK
17.0000 g | PACK | Freq: Two times a day (BID) | ORAL | Status: DC
  Administered 2017-09-29: 17 g via ORAL
  Filled 2017-09-28 (×2): qty 1

## 2017-09-28 MED ORDER — ONDANSETRON HCL 4 MG/2ML IJ SOLN: 4.0000 mg | Freq: Four times a day (QID) | INTRAMUSCULAR | Status: DC | PRN

## 2017-09-28 MED ORDER — SODIUM CHLORIDE 0.9 % IV BOLUS
500.0000 mL | Freq: Once | INTRAVENOUS | Status: AC
  Administered 2017-09-28: 500 mL via INTRAVENOUS

## 2017-09-28 MED ORDER — ENOXAPARIN SODIUM 40 MG/0.4ML ~~LOC~~ SOLN: 40.0000 mg | SUBCUTANEOUS | Status: DC

## 2017-09-28 MED ORDER — ACETAMINOPHEN 325 MG PO TABS
650.0000 mg | ORAL_TABLET | Freq: Four times a day (QID) | ORAL | Status: DC | PRN
  Administered 2017-09-30: 650 mg via ORAL
  Filled 2017-09-28: qty 2

## 2017-09-28 MED ORDER — SENNOSIDES-DOCUSATE SODIUM 8.6-50 MG PO TABS
1.0000 | ORAL_TABLET | Freq: Two times a day (BID) | ORAL | Status: DC
  Administered 2017-09-28 – 2017-09-29 (×2): 1 via ORAL
  Filled 2017-09-28 (×3): qty 1

## 2017-09-28 MED ORDER — IOPAMIDOL (ISOVUE-300) INJECTION 61%
100.0000 mL | Freq: Once | INTRAVENOUS | Status: AC | PRN
  Administered 2017-09-28: 100 mL via INTRAVENOUS

## 2017-09-28 MED ORDER — SODIUM CHLORIDE 0.9 % IV SOLN
1.0000 g | Freq: Once | INTRAVENOUS | Status: AC
  Administered 2017-09-28: 1 g via INTRAVENOUS
  Filled 2017-09-28: qty 10

## 2017-09-28 NOTE — Progress Notes (Signed)
PROGRESS NOTE    Denise Kelly  QQP:[id number] DOB: [birth year] DOA: 09/27/2017 PCP: [name], MD   Brief Narrative: Denise Kelly is a 77 y.o. female with history of chronic pain, alcohol abuse   Assessment & Plan:   Principal Problem:   Acute encephalopathy Active Problems:   Alcohol dependence (HCC)   Chronic back pain   Constipation, slow transit   Acute encephalopathy Likely secondary to alcohol withdrawal vs UTI vs combination -CIWA -Ceftriaxone -Urine culture pending  Recurrent UTI On Keflex as an outpatient. On hold.  History of alcoholism -CIWA as mentioned above  Chronic bilateral LE edema On spironolactone as an outpatient  Constipation In setting of chronic opiate use -Miralax  Chronic pain On chronic opiate.    DVT prophylaxis: SCDs Code Status:   Code Status: DNR Family Communication: Daughter on telephone Disposition Plan: Discharge pending improvement of mental status   Consultants:   None  Procedures:   None  Antimicrobials:  Ceftriaxone    Subjective: Confused.  Objective: Vitals:   09/28/17 0600 09/28/17 0618 09/28/17 0633 09/28/17 0729  BP: (!) 125/109 (!) 143/85  (!) 138/100  Pulse:   (!) 102 (!) 122  Resp:    16  Temp:    (!) 97.4 F (36.3 C)  TempSrc:    Oral  SpO2:   97% 96%    Intake/Output Summary (Last 24 hours) at 09/28/2017 1526 Last data filed at 09/28/2017 1429 Gross per 24 hour  Intake 1800 ml  Output -  Net 1800 ml   There were no vitals filed for this visit.  Examination:  General exam: Appears calm and comfortable Respiratory system: Clear to auscultation. Respiratory effort normal. Cardiovascular system: S1 & S2 heard, RRR. No murmurs, rubs, gallops or clicks. Gastrointestinal system: Abdomen is nondistended, soft and nontender. Normal bowel sounds heard. Central nervous system: Alert and oriented to place. No focal neurological deficits. Bilateral tremors. Does not follow commands  well. Extremities: No edema. No calf tenderness Skin: No cyanosis. No rashes Psychiatry: Judgement and insight appear impaired. Mood & affect appropriate.     Data Reviewed: I have personally reviewed following labs and imaging studies  CBC: Recent Labs  Lab 09/28/17 0017 09/28/17 0844  WBC 11.9* 14.7*  NEUTROABS 9.8*  --   HGB 15.9* 14.8  HCT 46.3* 44.7  MCV 86.7 86.5  PLT 200 712   Basic Metabolic Panel: Recent Labs  Lab 09/28/17 0017  NA 141  K 4.5  CL 101  CO2 30  GLUCOSE 143*  BUN 8  CREATININE 0.76  CALCIUM 9.4   GFR: CrCl cannot be calculated (Unknown ideal weight.). Liver Function Tests: Recent Labs  Lab 09/28/17 0017  AST 22  ALT 13  ALKPHOS 95  BILITOT 0.8  PROT 7.0  ALBUMIN 3.5   Recent Labs  Lab 09/28/17 0017  LIPASE 22   Recent Labs  Lab 09/28/17 0258  AMMONIA 13   Coagulation Profile: No results for input(s): INR, PROTIME in the last 168 hours. Cardiac Enzymes: No results for input(s): CKTOTAL, CKMB, CKMBINDEX, TROPONINI in the last 168 hours. BNP (last 3 results) No results for input(s): PROBNP in the last 8760 hours. HbA1C: No results for input(s): HGBA1C in the last 72 hours. CBG: No results for input(s): GLUCAP in the last 168 hours. Lipid Profile: No results for input(s): CHOL, HDL, LDLCALC, TRIG, CHOLHDL, LDLDIRECT in the last 72 hours. Thyroid Function Tests: No results for input(s): TSH, T4TOTAL, FREET4, T3FREE, THYROIDAB in the last  72 hours. Anemia Panel: No results for input(s): VITAMINB12, FOLATE, FERRITIN, TIBC, IRON, RETICCTPCT in the last 72 hours. Sepsis Labs: Recent Labs  Lab 09/28/17 0028  LATICACIDVEN 1.86    No results found for this or any previous visit (from the past 240 hour(s)).       Radiology Studies: Dg Chest 2 View  Result Date: 09/28/2017 CLINICAL DATA:  77 year old female with abdominal pain. EXAM: CHEST - 2 VIEW COMPARISON:  None. FINDINGS: Mild chronic bronchitic changes. No focal  consolidation, pleural effusion, or pneumothorax. The cardiac silhouette is within normal limits. No acute osseous pathology. IMPRESSION: No active cardiopulmonary disease. Electronically Signed   By: Anner Crete M.D.   On: 09/28/2017 00:48   Ct Head Wo Contrast  Result Date: 09/28/2017 CLINICAL DATA:  77 year old female with confusion. EXAM: CT HEAD WITHOUT CONTRAST TECHNIQUE: Contiguous axial images were obtained from the base of the skull through the vertex without intravenous contrast. COMPARISON:  Head CT dated 06/14/2016 FINDINGS: Evaluation is limited due to motion artifact as well as streak artifact caused by coiling material Brain: There is mild age-related atrophy and chronic microvascular ischemic changes. There is no acute intracranial hemorrhage. No mass effect or midline shift. No extra-axial fluid collection. Vascular: No hyperdense vessel or unexpected calcification. Skull: Normal. Negative for fracture or focal lesion. Sinuses/Orbits: No acute finding. Other: None IMPRESSION: No acute intracranial hemorrhage. Age-related atrophy and chronic microvascular ischemic changes. Electronically Signed   By: Anner Crete M.D.   On: 09/28/2017 04:05   Ct Abdomen Pelvis W Contrast  Result Date: 09/28/2017 CLINICAL DATA:  Constipation with rectal pain since yesterday. History of melanoma. EXAM: CT ABDOMEN AND PELVIS WITH CONTRAST TECHNIQUE: Multidetector CT imaging of the abdomen and pelvis was performed using the standard protocol following bolus administration of intravenous contrast. CONTRAST:  138mL ISOVUE-300 IOPAMIDOL (ISOVUE-300) INJECTION 61% COMPARISON:  Gallbladder ultrasound 02/26/2016. FINDINGS: Lower chest: Mild cardiomegaly without pericardial effusion. Aortic atherosclerosis is identified. Bibasilar atelectasis. No effusion or pneumothorax. Hepatobiliary: Probable Phrygian cap of the gallbladder without gallstones or secondary signs of acute cholecystitis. Water attenuating  lesions of the liver compatible cysts. No enhancing mass lesions or biliary dilatation. Pancreas: Atrophic pancreas without ductal dilatation or mass. Spleen: Normal Adrenals/Urinary Tract: Normal bilateral adrenal glands. Symmetric cortical enhancement of both kidneys with symmetric pyelograms on repeat delayed imaging. No hydroureteronephrosis. The urinary bladder is unremarkable for the degree of distention. Stomach/Bowel: Large stool ball measuring 7.8 x 6.4 x 9.3 cm in the rectum with rectal wall thickening and perirectal inflammation consistent with stercoral colitis. Scattered colonic diverticulosis with increased colonic stool retention consistent with constipation is also identified. Decompressed stomach with normal small bowel rotation. A few fluid-filled mildly distended small bowel loops may be secondary to peristaltic change. Enteritis is not entirely excluded but believed less likely. No findings of acute appendicitis. Vascular/Lymphatic: Mild aortoiliac and branch vessel atherosclerosis without aneurysm. No adenopathy. Reproductive: Left-sided predominantly intramural but slightly submucosal 12 mm in diameter fibroid. No adnexal mass. Other: No free air nor free fluid. Musculoskeletal: Left femoral nail fixation without complicating features. Inferior endplate compression of L1, chronic. Thoracolumbar spondylosis with evidence of prior lumbar spinal fusion at L4-5. IMPRESSION: 1. Cardiomegaly without pericardial effusion. 2. Water attenuating cysts most of which are too small to characterize within the liver. 3. Rectal thickening with perirectal inflammation and large stool ball consistent with stercoral colitis. Increased fecal retention within the colon compatible with constipation. 4. 12 mm uterine fibroid. 5. Thoracolumbar spondylosis with L4-5 fusion.  Chronic inferior endplate compression of L1. Electronically Signed   By: Ashley Royalty M.D.   On: 09/28/2017 01:56        Scheduled Meds: .  aspirin EC  81 mg Oral QHS  . folic acid  1 mg Oral Daily  . gabapentin  200 mg Oral Daily  . gabapentin  300 mg Oral QHS  . LORazepam  0-4 mg Intravenous Q6H   Followed by  . [START ON 09/30/2017] LORazepam  0-4 mg Intravenous Q12H  . multivitamin with minerals  1 tablet Oral Daily  . polyethylene glycol  17 g Oral BID  . senna-docusate  1 tablet Oral BID  . spironolactone  50 mg Oral BID  . thiamine  100 mg Oral Daily   Or  . thiamine  100 mg Intravenous Daily   Continuous Infusions: . cefTRIAXone (ROCEPHIN)  IV    . metronidazole Stopped (09/28/17 1429)     LOS: 0 days     Cordelia Poche, MD [facility] 09/28/2017, 3:26 PM Pager: [phone]  If 7PM-7AM, please contact night-coverage [email or web address] 09/28/2017, 3:26 PM

## 2017-09-28 NOTE — ED Provider Notes (Signed)
[facility] DEPT Provider Note   CSN: [id number] Arrival date & time: 09/27/17  2303     History   Chief Complaint Chief Complaint  Patient presents with  . Rectal Bleeding  . Constipation    HPI Denise Kelly is a 77 y.o. female.  HPI 77 year old Caucasian female past medical history significant for alcohol abuse, chronic pain with pain medication, depression, GERD, hypertension that presents to the emergency department today for evaluation of constipation, abdominal pain, rectal pain.  Patient states that she is on chronic pain medication and has a history of constipation.  She has tried MiraLAX without any relief.  Patient seems somewhat confused without any family at bedside.  States that she lives at home by herself and her neighbors called EMS this evening.  Patient reports nausea but denies any emesis.  Denies any associated fevers or chills.  Patient does not take any laxatives this week for her bowel movements.  Patient states her last bowel movement was 2 days ago.  Unsure if this had any melena or hematochezia.  History is somewhat limited secondary to patient's altered mental status.  Daughter at bedside reports the patient does have chronic UTIs and is on prophylactic Keflex 250 mg daily.  She reports that patient is definitely confused.  No history of dementia.  Pt denies any fever, chill, ha, vision changes, lightheadedness, dizziness, congestion, neck pain, cp, sob, cough,  urinary symptoms, change in bowel habits, melena, hematochezia, lower extremity paresthesias.  Past Medical History:  Diagnosis Date  . Alcohol abuse    ETOH and xanax  . Anxiety    Panic attacks   . Arthritis   . Cancer ([place])    skin- basal , squamous , melonoma- right hand  . Cellulitis    right leg  . Complication of anesthesia 2009   "felt drunk for a week after"- AVM- both times- felt drunk  . Depression   . Encephalopathy   . GERD (gastroesophageal  reflux disease)   . HOH (hard of hearing)   . HOH (hard of hearing)   . Hypertension    not on mediacations  . Palpitations   . PONV (postoperative nausea and vomiting)   . Spondylolisthesis of lumbosacral region   . UTI (urinary tract infection)    frequently    Patient Active Problem List   Diagnosis Date Noted  . Unsteady gait 04/28/2017  . Spondylolisthesis 07/17/2016  . Bilateral lower extremity edema 07/17/2016  . Chronic pain syndrome   . Depression 05/23/2016  . Chronic back pain 02/25/2016  . Lesion of liver 02/25/2016  . History of fracture of left hip 11/28/2015  . Fall 11/28/2015  . RLS (restless legs syndrome) 11/28/2015  . Confusion 03/14/2014  . Recurrent UTI 03/14/2014  . Effusion of right knee 02/22/2014  . Alcohol dependence ([place]) 02/22/2014  . Substance induced mood disorder ([place]) 02/22/2014  . Hepatic encephalopathy ([place])   . Altered mental status 02/21/2014  . SIRS (systemic inflammatory response syndrome) ([place]) 02/21/2014  . S/P total knee arthroplasty 01/02/2014  . Essential hypertension, benign 11/10/2013  . LAFB (left anterior fascicular block) 11/10/2013  . Obesity, unspecified 11/10/2013    Past Surgical History:  Procedure Laterality Date  . BREAST SURGERY Right    2 breast biopsies  . 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 COLONOSCOPY  2011   polyps  . EYE SURGERY Bilateral    cataracts  . INTRAMEDULLARY (IM) NAIL INTERTROCHANTERIC  Left 11/29/2015   Procedure: LEFT HIP   NAIL;  Surgeon: Renette Butters, MD;  Location: [facility];  Service: Orthopedics;  Laterality: Left;  . JOINT REPLACEMENT Left 09/2009   knee  . LUMBAR FUSION  07/17/2016   Lumbar four-five Posterior lumbar interbody fusion (N/A)  . TONSILLECTOMY    . TOTAL KNEE ARTHROPLASTY Right 01/02/2014   Procedure: TOTAL KNEE ARTHROPLASTY;  Surgeon: [name], MD;  Location: [facility];  Service: Orthopedics;  Laterality: Right;  . TUBAL LIGATION       OB History     None      Home Medications    Prior to Admission medications   Medication Sig Start Date End Date Taking? Authorizing Provider  acetaminophen (TYLENOL) 500 MG tablet Take 1,000 mg by mouth every 6 (six) hours as needed for mild pain, moderate pain or headache.     [provider]  acidophilus (RISAQUAD) CAPS capsule Take 2 capsules by mouth daily. 05/29/16   Elgergawy, Silver Huguenin, MD  aspirin EC 81 MG tablet Take 81 mg by mouth at bedtime.     [provider]  cephALEXin (KEFLEX) 250 MG capsule Take 250 mg by mouth daily.    [provider]  estradiol (ESTRACE) 0.1 MG/GM vaginal cream Place 1 Applicatorful vaginally at bedtime as needed (for vaginal discomfort).    [provider]  gabapentin (NEURONTIN) 100 MG capsule Take 200-300 mg by mouth 2 (two) times daily. 200 mg in the morning & 300 mg at bedtime    [provider]  hydrOXYzine (ATARAX/VISTARIL) 25 MG tablet Take 25 mg by mouth daily as needed for anxiety (and/or sleep).     [provider]  loratadine (CLARITIN) 10 MG tablet Take 10 mg by mouth 2 (two) times daily.     [provider]  methocarbamol (ROBAXIN) 500 MG tablet Take 250 mg by mouth daily as needed (for leg cramps/spasms.).     [provider]  ondansetron (ZOFRAN-ODT) 4 MG disintegrating tablet Take 4 mg by mouth every 6 (six) hours as needed for nausea or vomiting.     [provider]  oxyCODONE (OXY IR/ROXICODONE) 5 MG immediate release tablet Take 1 tablet (5 mg total) by mouth every 6 (six) hours as needed for severe pain. Patient taking differently: Take 10 mg by mouth every 8 (eight) hours as needed for severe pain.  05/29/16   Elgergawy, Silver Huguenin, MD  Polyethyl Glycol-Propyl Glycol (SYSTANE OP) Apply 1-2 drops to eye 3 (three) times daily as needed (for dry eyes).    [provider]  pramipexole (MIRAPEX) 0.125 MG tablet Take 0.375 mg by mouth 2 (two) times daily as needed (for  restless leg syndrome).     [provider]  senna-docusate (SENOKOT S) 8.6-50 MG tablet Take 1 tablet by mouth 2 (two) times daily. 02/28/16   Eugenie Filler, MD  spironolactone (ALDACTONE) 100 MG tablet Take 100 mg by mouth daily.    [provider]  traMADol (ULTRAM) 50 MG tablet Take 1 tablet (50 mg total) by mouth every 6 (six) hours as needed for moderate pain. 02/28/16   Eugenie Filler, MD  traZODone (DESYREL) 50 MG tablet Take 100 mg by mouth daily as needed for sleep. 06/17/16   [provider]    Family History Family History  Problem Relation Age of Onset  . Hypertension Other   . Cancer Mother   . Cancer Father     Social History Social  History   Tobacco Use  . Smoking status: Former Smoker    Packs/day: 1.00    Years: 20.00    Pack years: 20.00    Last attempt to quit: 03/17/1992    Years since quitting: 25.5  . Smokeless tobacco: Never Used  . Tobacco comment: quit smoking many years ago .  quit 1994  Substance Use Topics  . Alcohol use: No    Comment: unable to get alcohol per family not since September  . Drug use: No     Allergies   Sulfa antibiotics; Coreg [carvedilol]; Motrin [ibuprofen]; Toprol xl [metoprolol tartrate]; Doxycycline hyclate; Flovent hfa [fluticasone]; and Statins   Review of Systems Review of Systems  All other systems reviewed and are negative.    Physical Exam Updated Vital Signs BP (!) 156/122 (BP Location: Right Arm)   Pulse (!) 110   Temp 97.8 F (36.6 C) (Oral)   Resp 20   SpO2 96%   Physical Exam  Constitutional: She is oriented to person, place, and time. She appears well-developed and well-nourished.  Non-toxic appearance. She appears distressed.  Patient appears uncomfortable secondary to pain.  HENT:  Head: Normocephalic and atraumatic.  Nose: Nose normal.  Mouth/Throat: Oropharynx is clear and moist.  Eyes: Pupils are equal, round, and reactive to light. Conjunctivae are normal.  Right eye exhibits no discharge. Left eye exhibits no discharge.  Neck: Normal range of motion. Neck supple.  Cardiovascular: Normal rate, regular rhythm, normal heart sounds and intact distal pulses. Exam reveals no gallop and no friction rub.  No murmur heard. Pulmonary/Chest: Effort normal and breath sounds normal. No stridor. No respiratory distress. She has no wheezes. She has no rales. She exhibits no tenderness.  Abdominal: Soft. Normal appearance. Bowel sounds are decreased. There is generalized tenderness and tenderness in the right lower quadrant and suprapubic area. There is guarding. There is no rigidity, no rebound, no CVA tenderness, no tenderness at McBurney's point and negative Murphy's sign.  Musculoskeletal: Normal range of motion. She exhibits no tenderness.  Lymphadenopathy:    She has no cervical adenopathy.  Neurological: She is alert and oriented to person, place, and time.  Patient alert oriented x3 however there is some confusion with patient.  She answers questions appropriately but is easily distractible.  Follows commands.  Skin: Skin is warm and dry. Capillary refill takes less than 2 seconds.  Psychiatric: Her behavior is normal. Judgment and thought content normal.  Nursing note and vitals reviewed.    ED Treatments / Results  Labs (all labs ordered are listed, but only abnormal results are displayed) Labs Reviewed  COMPREHENSIVE METABOLIC PANEL - Abnormal; Notable for the following components:      Result Value   Glucose, Bld 143 (*)    All other components within normal limits  CBC WITH DIFFERENTIAL/PLATELET - Abnormal; Notable for the following components:   WBC 11.9 (*)    RBC 5.34 (*)    Hemoglobin 15.9 (*)    HCT 46.3 (*)    Neutro Abs 9.8 (*)    All other components within normal limits  URINALYSIS, ROUTINE W REFLEX MICROSCOPIC - Abnormal; Notable for the following components:   pH 9.0 (*)    Nitrite POSITIVE (*)    Leukocytes, UA LARGE (*)     Bacteria, UA RARE (*)    All other components within normal limits  POC OCCULT BLOOD, ED - Abnormal; Notable for the following components:   Fecal Occult Bld POSITIVE (*)  All other components within normal limits  URINE CULTURE  LIPASE, BLOOD  AMMONIA  RAPID URINE DRUG SCREEN, HOSP PERFORMED  POC OCCULT BLOOD, ED  I-STAT CG4 LACTIC ACID, ED  I-STAT CG4 LACTIC ACID, ED    EKG None  Radiology Dg Chest 2 View  Result Date: 09/28/2017 CLINICAL DATA:  77 year old female with abdominal pain. EXAM: CHEST - 2 VIEW COMPARISON:  None. FINDINGS: Mild chronic bronchitic changes. No focal consolidation, pleural effusion, or pneumothorax. The cardiac silhouette is within normal limits. No acute osseous pathology. IMPRESSION: No active cardiopulmonary disease. Electronically Signed   By: Anner Crete M.D.   On: 09/28/2017 00:48   Ct Head Wo Contrast  Result Date: 09/28/2017 CLINICAL DATA:  77 year old female with confusion. EXAM: CT HEAD WITHOUT CONTRAST TECHNIQUE: Contiguous axial images were obtained from the base of the skull through the vertex without intravenous contrast. COMPARISON:  Head CT dated 06/14/2016 FINDINGS: Evaluation is limited due to motion artifact as well as streak artifact caused by coiling material Brain: There is mild age-related atrophy and chronic microvascular ischemic changes. There is no acute intracranial hemorrhage. No mass effect or midline shift. No extra-axial fluid collection. Vascular: No hyperdense vessel or unexpected calcification. Skull: Normal. Negative for fracture or focal lesion. Sinuses/Orbits: No acute finding. Other: None IMPRESSION: No acute intracranial hemorrhage. Age-related atrophy and chronic microvascular ischemic changes. Electronically Signed   By: Anner Crete M.D.   On: 09/28/2017 04:05   Ct Abdomen Pelvis W Contrast  Result Date: 09/28/2017 CLINICAL DATA:  Constipation with rectal pain since yesterday. History of melanoma. EXAM: CT  ABDOMEN AND PELVIS WITH CONTRAST TECHNIQUE: Multidetector CT imaging of the abdomen and pelvis was performed using the standard protocol following bolus administration of intravenous contrast. CONTRAST:  18mL ISOVUE-300 IOPAMIDOL (ISOVUE-300) INJECTION 61% COMPARISON:  Gallbladder ultrasound 02/26/2016. FINDINGS: Lower chest: Mild cardiomegaly without pericardial effusion. Aortic atherosclerosis is identified. Bibasilar atelectasis. No effusion or pneumothorax. Hepatobiliary: Probable Phrygian cap of the gallbladder without gallstones or secondary signs of acute cholecystitis. Water attenuating lesions of the liver compatible cysts. No enhancing mass lesions or biliary dilatation. Pancreas: Atrophic pancreas without ductal dilatation or mass. Spleen: Normal Adrenals/Urinary Tract: Normal bilateral adrenal glands. Symmetric cortical enhancement of both kidneys with symmetric pyelograms on repeat delayed imaging. No hydroureteronephrosis. The urinary bladder is unremarkable for the degree of distention. Stomach/Bowel: Large stool ball measuring 7.8 x 6.4 x 9.3 cm in the rectum with rectal wall thickening and perirectal inflammation consistent with stercoral colitis. Scattered colonic diverticulosis with increased colonic stool retention consistent with constipation is also identified. Decompressed stomach with normal small bowel rotation. A few fluid-filled mildly distended small bowel loops may be secondary to peristaltic change. Enteritis is not entirely excluded but believed less likely. No findings of acute appendicitis. Vascular/Lymphatic: Mild aortoiliac and branch vessel atherosclerosis without aneurysm. No adenopathy. Reproductive: Left-sided predominantly intramural but slightly submucosal 12 mm in diameter fibroid. No adnexal mass. Other: No free air nor free fluid. Musculoskeletal: Left femoral nail fixation without complicating features. Inferior endplate compression of L1, chronic. Thoracolumbar  spondylosis with evidence of prior lumbar spinal fusion at L4-5. IMPRESSION: 1. Cardiomegaly without pericardial effusion. 2. Water attenuating cysts most of which are too small to characterize within the liver. 3. Rectal thickening with perirectal inflammation and large stool ball consistent with stercoral colitis. Increased fecal retention within the colon compatible with constipation. 4. 12 mm uterine fibroid. 5. Thoracolumbar spondylosis with L4-5 fusion. Chronic inferior endplate compression of L1. Electronically Signed  By: Ashley Royalty M.D.   On: 09/28/2017 01:56    Procedures Procedures (including critical care time)  Medications Ordered in ED Medications  iopamidol (ISOVUE-300) 61 % injection (has no administration in time range)  bisacodyl (DULCOLAX) EC tablet 5 mg (has no administration in time range)  iopamidol (ISOVUE-300) 61 % injection 100 mL (100 mLs Intravenous Contrast Given 09/28/17 0129)  sodium chloride 0.9 % bolus 500 mL (0 mLs Intravenous Stopped 09/28/17 0255)  HYDROmorphone (DILAUDID) injection 0.5 mg (0.5 mg Intravenous Given 09/28/17 0148)  cefTRIAXone (ROCEPHIN) 1 g in sodium chloride 0.9 % 100 mL IVPB (0 g Intravenous Stopped 09/28/17 0325)  sodium chloride 0.9 % bolus 1,000 mL (0 mLs Intravenous Stopped 09/28/17 0355)     Initial Impression / Assessment and Plan / ED Course  I have reviewed the triage vital signs and the nursing notes.  Pertinent labs & imaging results that were available during my care of the patient were reviewed by me and considered in my medical decision making (see chart for details).     Patient presents to the emergency department today for evaluation of abdominal pain, constipation and confusion.  Patient is on chronic pain medication has chronic constipation.  She has not been taken her MiraLAX for the past week.  She is on daily Keflex for UTI.  Initial lab work patient was afebrile.  She was tachycardic.  No tachypnea, hypoxia or  hypotension noted.  On exam patient is confused.  She is alert oriented x3 however easily distractible but follows commands.  Daughter states this is not her baseline.  She does have diffuse abdominal pain to palpation with decreased bowel sounds and patient has some guarding as well.  Heart regular rate and rhythm.  Lungs were clear clear to auscultation bilaterally.  Labs show leukocytosis of 11,000.  Patient has elevated hemoglobin of 15.9.  No significant electrolyte derangement.  Liver enzymes were normal.  Normal lipase.  Patient's lactic acid was normal.  UA shows positive nitrites, leukocytes and rare bacteria.  Occult blood was positive.  Urine culture is pending.  CT abdomen pelvis was ordered given patient's history of constipation and abdominal pain to rule out obstruction or acute infection.  She also had a chest x-ray to rule out any signs of pneumonia.  Chest x-ray was reassuring without any acute findings.  CT abdomen pelvis reveals rectal thickening and perirectal inflammation with stool ball present concern for sterile coral colitis.  Patient again is very altered.  She is not very cooperative.  Difficult to perform fecal impaction so will give patient laxatives and enema.  In her confusion CT head was ordered and ammonia levels ordered.  These were both reassuring.  Unknown cause of patient's ams.  Suspect possible urosepsis.  Start on antibiotics.  Given fluids as she does appear dehydrated and dry.  Patient's tachycardia improved with fluids and pain medication.  Patient will need to be admitted to hospital service for further evaluation and work-up.  Spoke with Dr. Hal Hope with hospital medicine who agrees to admission and will see patient in the ED and place admission orders.  Pt also seen and eval by my attending who is agreeable with the above plan.    Final Clinical Impressions(s) / ED Diagnoses   Final diagnoses:  Generalized abdominal pain  Constipation,  unspecified constipation type  Rectal bleeding  Altered mental status, unspecified altered mental status type    ED Discharge Orders    None  Doristine Devoid, PA-C 09/28/17 0450    Rolland Porter, MD 09/28/17 [phone]

## 2017-09-28 NOTE — H&P (Addendum)
History and Physical    Denise Kelly:[id number] DOB: 15-Feb-1941 DOA: 09/27/2017  PCP: Denise Kelly, Pcp Not In  Patient coming from: Home.  History obtained from patient's daughter.  Chief Complaint: Constipation.  HPI: Denise Kelly is a 77 y.o. female with history of chronic pain who has not been taking her MiraLAX for last 1 week was brought to the ER with the patient is complaining of increasing difficulty moving bowels last 2 days.  Patient also is complaining of pain around the perirectal area.  Denies any nausea vomiting.  Denies any fever or chills.  In addition it is noted that patient has become acutely confused.  Patient states she was found to be normal last afternoon but when patient's daughter checked back on her in the evening she looked more confused.  She did not have any focal deficits or any loss of consciousness.  Patient drinks wine every day.  No change in her medications.  ED Course: In the ER patient looked confused tremulous and anxious.  CT head was unremarkable.  CT abdomen and pelvis done shows features concerning for constipation with some perirectal inflammation.  UA is consistent with UTI.  Ammonia levels were normal.  Patient was started on ceftriaxone and 1 dose of enema was given.  Review of Systems: As per HPI, rest all negative.   Past Medical History:  Diagnosis Date  . Alcohol abuse    ETOH and xanax  . Anxiety    Panic attacks   . Arthritis   . Cancer ([place])    skin- basal , squamous , melonoma- right hand  . Cellulitis    right leg  . Complication of anesthesia 2009   "felt drunk for a week after"- AVM- both times- felt drunk  . Depression   . Encephalopathy   . GERD (gastroesophageal reflux disease)   . HOH (hard of hearing)   . HOH (hard of hearing)   . Hypertension    not on mediacations  . Palpitations   . PONV (postoperative nausea and vomiting)   . Spondylolisthesis of lumbosacral region   . UTI (urinary tract infection)    frequently    Past Surgical History:  Procedure Laterality Date  . BREAST SURGERY Right    2 breast biopsies  . 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 COLONOSCOPY  2011   polyps  . EYE SURGERY Bilateral    cataracts  . INTRAMEDULLARY (IM) NAIL INTERTROCHANTERIC Left 11/29/2015   Procedure: LEFT HIP   NAIL;  Surgeon: Renette Butters, MD;  Location: [place];  Service: Orthopedics;  Laterality: Left;  . JOINT REPLACEMENT Left 09/2009   knee  . LUMBAR FUSION  07/17/2016   Lumbar four-five Posterior lumbar interbody fusion (N/A)  . TONSILLECTOMY    . TOTAL KNEE ARTHROPLASTY Right 01/02/2014   Procedure: TOTAL KNEE ARTHROPLASTY;  Surgeon: [name], MD;  Location: [place];  Service: Orthopedics;  Laterality: Right;  . TUBAL LIGATION       reports that she quit smoking about 25 years ago. She has a 20.00 pack-year smoking history. She has never used smokeless tobacco. She reports that she does not drink alcohol or use drugs.  Allergies  Allergen Reactions  . Sulfa Antibiotics Hives, Swelling and Other (See Comments)    Reaction:  Facial/eye swelling   . Coreg [Carvedilol] Other (See Comments)    Memory loss  . Motrin [Ibuprofen] Palpitations  . Toprol Xl [Metoprolol Tartrate] Cough  .  Doxycycline Hyclate Other (See Comments)    HEARTBURN  . Flovent Hfa [Fluticasone] Itching  . Statins Other (See Comments)    MENTAL STATUS CHANGE    Family History  Problem Relation Age of Onset  . Hypertension Other   . Cancer Mother   . Cancer Father     Prior to Admission medications   Medication Sig Start Date End Date Taking? Authorizing Provider  acetaminophen (TYLENOL) 500 MG tablet Take 1,000 mg by mouth every 6 (six) hours as needed for mild pain, moderate pain or headache.    Yes [provider]  acidophilus (RISAQUAD) CAPS capsule Take 2 capsules by mouth daily. 05/29/16  Yes Elgergawy, Silver Huguenin, MD  alendronate (FOSAMAX) 70 MG tablet Take 70 mg by mouth once a  week.  08/03/17  Yes [provider]  aspirin EC 81 MG tablet Take 81 mg by mouth at bedtime.    Yes [provider]  cephALEXin (KEFLEX) 250 MG capsule Take 250 mg by mouth daily.   Yes [provider]  estradiol (ESTRACE) 0.1 MG/GM vaginal cream Place 1 Applicatorful vaginally at bedtime as needed (for vaginal discomfort).   Yes [provider]  gabapentin (NEURONTIN) 100 MG capsule Take 200-300 mg by mouth 2 (two) times daily. 200 mg in the morning & 300 mg at bedtime   Yes [provider]  hydrOXYzine (ATARAX/VISTARIL) 25 MG tablet Take 25 mg by mouth daily as needed for anxiety (and/or sleep).    Yes [provider]  loratadine (CLARITIN) 10 MG tablet Take 10 mg by mouth daily.    Yes [provider]  methocarbamol (ROBAXIN) 500 MG tablet Take 250 mg by mouth daily as needed (for leg cramps/spasms.).    Yes [provider]  ondansetron (ZOFRAN-ODT) 4 MG disintegrating tablet Take 4 mg by mouth every 6 (six) hours as needed for nausea or vomiting.    Yes [provider]  Oxycodone HCl 10 MG TABS Take 10 mg by mouth every 8 (eight) hours as needed (pain).  09/18/17  Yes [provider]  Polyethyl Glycol-Propyl Glycol (SYSTANE OP) Apply 1-2 drops to eye 3 (three) times daily as needed (for dry eyes).   Yes [provider]  senna-docusate (SENOKOT S) 8.6-50 MG tablet Take 1 tablet by mouth 2 (two) times daily. 02/28/16  Yes Eugenie Filler, MD  spironolactone (ALDACTONE) 50 MG tablet Take 50 mg by mouth 2 (two) times daily.  09/16/17  Yes [provider]  traMADol (ULTRAM) 50 MG tablet Take 1 tablet (50 mg total) by mouth every 6 (six) hours as needed for moderate pain. 02/28/16  Yes Eugenie Filler, MD  traZODone (DESYREL) 50 MG tablet Take 100 mg by mouth daily as needed for sleep. 06/17/16  Yes [provider]  oxyCODONE (OXY IR/ROXICODONE) 5 MG immediate release tablet Take 1 tablet  (5 mg total) by mouth every 6 (six) hours as needed for severe pain. Patient not taking: Reported on 09/28/2017 05/29/16   Elgergawy, Silver Huguenin, MD    Physical Exam: Vitals:   09/28/17 0148 09/28/17 0307 09/28/17 0445 09/28/17 0500  BP: (!) 157/85 (!) 139/101  (!) 119/108  Pulse: (!) 108 97 (!) 119   Resp: 14     Temp:      TempSrc:      SpO2: 100% 98% (!) 80%       Constitutional: Moderately built and nourished. Vitals:   09/28/17 0148 09/28/17 0307 09/28/17 0445 09/28/17 0500  BP: (!) 157/85 (!) 139/101  (!) 119/108  Pulse: (!) 108 97 (!) 119   Resp: 14     Temp:      TempSrc:      SpO2: 100% 98% (!) 80%    Eyes: Anicteric no pallor. ENMT: No discharge from the ears eyes nose or mouth. Neck: No mass felt.  No neck rigidity.  No JVD appreciated. Respiratory: No rhonchi or crepitations. Cardiovascular: S1-S2 heard no murmurs appreciated. Abdomen: Soft nontender bowel sounds present. Musculoskeletal: No edema.  No joint effusion. Skin: No rash. Neurologic: Alert awake oriented to name only.  Moves all extremities with some tremors. Psychiatric: Appears confused.   Labs on Admission: I have personally reviewed following labs and imaging studies  CBC: Recent Labs  Lab 09/28/17 0017  WBC 11.9*  NEUTROABS 9.8*  HGB 15.9*  HCT 46.3*  MCV 86.7  PLT 935   Basic Metabolic Panel: Recent Labs  Lab 09/28/17 0017  NA 141  K 4.5  CL 101  CO2 30  GLUCOSE 143*  BUN 8  CREATININE 0.76  CALCIUM 9.4   GFR: CrCl cannot be calculated (Unknown ideal weight.). Liver Function Tests: Recent Labs  Lab 09/28/17 0017  AST 22  ALT 13  ALKPHOS 95  BILITOT 0.8  PROT 7.0  ALBUMIN 3.5   Recent Labs  Lab 09/28/17 0017  LIPASE 22   Recent Labs  Lab 09/28/17 0258  AMMONIA 13   Coagulation Profile: No results for input(s): INR, PROTIME in the last 168 hours. Cardiac Enzymes: No results for input(s): CKTOTAL, CKMB, CKMBINDEX, TROPONINI in the last 168 hours. BNP  (last 3 results) No results for input(s): PROBNP in the last 8760 hours. HbA1C: No results for input(s): HGBA1C in the last 72 hours. CBG: No results for input(s): GLUCAP in the last 168 hours. Lipid Profile: No results for input(s): CHOL, HDL, LDLCALC, TRIG, CHOLHDL, LDLDIRECT in the last 72 hours. Thyroid Function Tests: No results for input(s): TSH, T4TOTAL, FREET4, T3FREE, THYROIDAB in the last 72 hours. Anemia Panel: No results for input(s): VITAMINB12, FOLATE, FERRITIN, TIBC, IRON, RETICCTPCT in the last 72 hours. Urine analysis:    Component Value Date/Time   COLORURINE YELLOW 09/28/2017 0116   APPEARANCEUR CLEAR 09/28/2017 0116   APPEARANCEUR Clear 09/19/2016 1633   LABSPEC 1.009 09/28/2017 0116   PHURINE 9.0 (H) 09/28/2017 0116   GLUCOSEU NEGATIVE 09/28/2017 0116   HGBUR NEGATIVE 09/28/2017 0116   BILIRUBINUR NEGATIVE 09/28/2017 0116   BILIRUBINUR Negative 09/19/2016 1633   KETONESUR NEGATIVE 09/28/2017 0116   PROTEINUR NEGATIVE 09/28/2017 0116   UROBILINOGEN 1.0 11/30/2014 2105   NITRITE POSITIVE (A) 09/28/2017 0116   LEUKOCYTESUR LARGE (A) 09/28/2017 0116   LEUKOCYTESUR 1+ (A) 09/19/2016 1633   Sepsis Labs: @LABRCNTIP (procalcitonin:4,lacticidven:4) )No results found for this or any previous visit (from the past 240 hour(s)).   Radiological Exams on Admission: Dg Chest 2 View  Result Date: 09/28/2017 CLINICAL DATA:  77 year old female with abdominal pain. EXAM: CHEST - 2 VIEW COMPARISON:  None. FINDINGS: Mild chronic bronchitic changes. No focal consolidation, pleural effusion, or pneumothorax. The cardiac silhouette is within normal limits. No acute osseous pathology. IMPRESSION: No active cardiopulmonary disease. Electronically Signed   By: Anner Crete M.D.   On: 09/28/2017 00:48   Ct Head Wo Contrast  Result Date: 09/28/2017 CLINICAL DATA:  77 year old female with confusion. EXAM: CT HEAD WITHOUT CONTRAST TECHNIQUE: Contiguous axial images were obtained  from the base of the skull through the vertex without intravenous contrast. COMPARISON:  Head CT dated 06/14/2016 FINDINGS: Evaluation is limited due to motion artifact as well as streak artifact caused by coiling material Brain: There is mild age-related atrophy and chronic microvascular ischemic changes. There is no acute intracranial hemorrhage. No mass effect or midline shift. No extra-axial fluid collection. Vascular: No hyperdense vessel or unexpected calcification. Skull: Normal. Negative for fracture or focal lesion. Sinuses/Orbits: No acute finding. Other: None IMPRESSION: No acute intracranial hemorrhage. Age-related atrophy and chronic microvascular ischemic changes. Electronically Signed   By: Anner Crete M.D.   On: 09/28/2017 04:05   Ct Abdomen Pelvis W Contrast  Result Date: 09/28/2017 CLINICAL DATA:  Constipation with rectal pain since yesterday. History of melanoma. EXAM: CT ABDOMEN AND PELVIS WITH CONTRAST TECHNIQUE: Multidetector CT imaging of the abdomen and pelvis was performed using the standard protocol following bolus administration of intravenous contrast. CONTRAST:  163mL ISOVUE-300 IOPAMIDOL (ISOVUE-300) INJECTION 61% COMPARISON:  Gallbladder ultrasound 02/26/2016. FINDINGS: Lower chest: Mild cardiomegaly without pericardial effusion. Aortic atherosclerosis is identified. Bibasilar atelectasis. No effusion or pneumothorax. Hepatobiliary: Probable Phrygian cap of the gallbladder without gallstones or secondary signs of acute cholecystitis. Water attenuating lesions of the liver compatible cysts. No enhancing mass lesions or biliary dilatation. Pancreas: Atrophic pancreas without ductal dilatation or mass. Spleen: Normal Adrenals/Urinary Tract: Normal bilateral adrenal glands. Symmetric cortical enhancement of both kidneys with symmetric pyelograms on repeat delayed imaging. No hydroureteronephrosis. The urinary bladder is unremarkable for the degree of distention. Stomach/Bowel:  Large stool ball measuring 7.8 x 6.4 x 9.3 cm in the rectum with rectal wall thickening and perirectal inflammation consistent with stercoral colitis. Scattered colonic diverticulosis with increased colonic stool retention consistent with constipation is also identified. Decompressed stomach with normal small bowel rotation. A few fluid-filled mildly distended small bowel loops may be secondary to peristaltic change. Enteritis is not entirely excluded but believed less likely. No findings of acute appendicitis. Vascular/Lymphatic: Mild aortoiliac and branch vessel atherosclerosis without aneurysm. No adenopathy. Reproductive: Left-sided predominantly intramural but slightly submucosal 12 mm in diameter fibroid. No adnexal mass. Other: No free air nor free fluid. Musculoskeletal: Left femoral nail fixation without complicating features. Inferior endplate compression of L1, chronic. Thoracolumbar spondylosis with evidence of prior lumbar spinal fusion at L4-5. IMPRESSION: 1. Cardiomegaly without pericardial effusion. 2. Water attenuating cysts most of which are too small to characterize within the liver. 3. Rectal thickening with perirectal inflammation and large stool ball consistent with stercoral colitis. Increased fecal retention within the colon compatible with constipation. 4. 12 mm uterine fibroid. 5. Thoracolumbar spondylosis with L4-5 fusion. Chronic inferior endplate compression of L1. Electronically Signed   By: Ashley Royalty M.D.   On: 09/28/2017 01:56    Assessment/Plan Principal Problem:   Acute encephalopathy Active Problems:   Alcohol dependence (HCC)   Chronic back pain   Constipation, slow transit    1. Acute encephalopathy -cause not clear but could be due to the alcoholism or medications.  Not sure if she had cut down alcohol level or had missed some of her medications which are predominantly pain relief medications.  Since patient's symptoms are acute in onset and MRI brain has been  ordered.  Patient also has UTI which could contribute to the symptoms. 2. UTI patient is placed on ceftriaxone follow urine cultures. 3. Constipation -patient placed back on her MiraLAX continue with senna and 1 dose of enema was given.  Closely observe.  Will place patient on Flagyl in addition to ceftriaxone due to some perirectal inflammation. 4. History of alcoholism  on CIWA protocol. 5. Chronic back pain continue gabapentin and oxycodone. 6. On spironolactone for chronic bilateral lower extremity edema.  Will need physical therapy consult once patient's confusion improves.  DVT prophylaxis: SCDs for now until we make sure patient is not having significant drop in hemoglobin since guaiac was positive. Code Status: DNR. Family Communication: Discussed with patient. Disposition Plan: Home. Consults called: None. Admission status: Observation.   Rise Patience MD [facility] Pager [phone].  If 7PM-7AM, please contact night-coverage [email or web address] Password [facility]  09/28/2017, 6:03 AM

## 2017-09-28 NOTE — Progress Notes (Signed)
Patient was transferred from ED at [facility]. Alert and confused. Very anxious and agitated.patient is yelling and screaming without any reason. ED RN reported that patient refused to have MRI before coming to [place]. Vital sign was taken. Will monitor patient as protocol

## 2017-09-28 NOTE — ED Provider Notes (Signed)
Daughter states patient normally lives on her own.  She has chronic UTIs and takes Keflex daily as prophylaxis.  She states today the patient started complaining of constipation and has become confused.  When I am in the room patient seems very confused, she seems easily agitated, she does not understand simple questions or what is going on.  She is very pale in color.  Her mucous membranes are very dry.  Medical screening examination/treatment/procedure(s) were conducted as a shared visit with non-physician practitioner(s) and myself.  I personally evaluated the patient during the encounter.  None   Rolland Porter, MD, Barbette Or, MD 09/28/17 [phone]

## 2017-09-28 NOTE — ED Notes (Signed)
ED TO INPATIENT HANDOFF REPORT  Name/Age/Gender Denise Kelly 77 y.o. female  Code Status    Code Status Orders  (From admission, onward)        Start     Ordered   09/28/17 0602  Do not attempt resuscitation (DNR)  Continuous    Question Answer Comment  In the event of cardiac or respiratory ARREST Do not call a "code blue"   In the event of cardiac or respiratory ARREST Do not perform Intubation, CPR, defibrillation or ACLS   In the event of cardiac or respiratory ARREST Use medication by any route, position, wound care, and other measures to relive pain and suffering. May use oxygen, suction and manual treatment of airway obstruction as needed for comfort.      09/28/17 0603    Code Status History    Date Active Date Inactive Code Status Order ID Comments User Context   07/17/2016 1635 07/21/2016 1717 Full Code [id number]  Kary Kos, MD Inpatient   06/14/2016 1658 06/17/2016 2304 Full Code [id number]  Reyne Dumas, MD ED   05/23/2016 1947 05/29/2016 1532 Full Code [id number]  Ivor Costa, MD ED   02/26/2016 0037 02/28/2016 2249 Full Code [id number]  Toy Baker, MD Inpatient   01/23/2016 0150 01/26/2016 1430 Full Code [id number]  Rise Patience, MD Inpatient   11/28/2015 0414 12/01/2015 1856 Full Code [id number]  Norval Morton, MD ED   11/30/2014 2238 12/02/2014 2104 Full Code [id number]  Allyne Gee, MD Inpatient   03/14/2014 0222 03/15/2014 1819 Full Code [id number]  Rise Patience, MD Inpatient   02/21/2014 1717 02/24/2014 1915 Full Code [id number]  Reyne Dumas, MD ED   01/18/2014 2046 01/20/2014 1704 Full Code [id number]  Carlynn Spry, PA-C Inpatient   01/02/2014 1115 01/06/2014 1509 Full Code [id number]  Carlynn Spry, PA-C Inpatient      Home/SNF/Other Home  Chief Complaint Rectal Bleeding  Level of Care/Admitting Diagnosis ED Disposition    ED Disposition Condition [place] [facility]: [facility] [[id number]]  Level of Care:  Med-Surg [16]  Diagnosis: Acute encephalopathy [242683]  Admitting Physician: Rise Patience [phone]  Attending Physician: Rise Patience [[id number]  PT Class (Do Not Modify): Observation [104]  PT Acc Code (Do Not Modify): Observation [10022]       Medical History Past Medical History:  Diagnosis Date  . Alcohol abuse    ETOH and xanax  . Anxiety    Panic attacks   . Arthritis   . Cancer ([place])    skin- basal , squamous , melonoma- right hand  . Cellulitis    right leg  . Complication of anesthesia 2009   "felt drunk for a week after"- AVM- both times- felt drunk  . Depression   . Encephalopathy   . GERD (gastroesophageal reflux disease)   . HOH (hard of hearing)   . HOH (hard of hearing)   . Hypertension    not on mediacations  . Palpitations   . PONV (postoperative nausea and vomiting)   . Spondylolisthesis of lumbosacral region   . UTI (urinary tract infection)    frequently    Allergies Allergies  Allergen Reactions  . Sulfa Antibiotics Hives, Swelling and Other (See Comments)    Reaction:  Facial/eye swelling   . Coreg [Carvedilol] Other (See Comments)    Memory loss  . Motrin [Ibuprofen] Palpitations  . Toprol Xl [Metoprolol Tartrate] Cough  . Doxycycline Hyclate Other (See Comments)  HEARTBURN  . Flovent Hfa [Fluticasone] Itching  . Statins Other (See Comments)    MENTAL STATUS CHANGE    IV Location/Drains/Wounds Patient Lines/Drains/Airways Status   Active Line/Drains/Airways    Name:   Placement date:   Placement time:   Site:   Days:   Peripheral IV 09/28/17 Right;Lateral Forearm   09/28/17    0016    Forearm   less than 1   Closed System Drain 1 Posterior Back Accordion (Hemovac)   07/17/16    1400    Back   438   Incision (Closed) 11/29/15 Hip Left   11/29/15    1026     669   Incision (Closed) 07/17/16 Back Other (Comment)   07/17/16    1252     438          Labs/Imaging Results for orders placed or performed during the  hospital encounter of 09/27/17 (from the past 48 hour(s))  Comprehensive metabolic panel     Status: Abnormal   Collection Time: 09/28/17 12:17 AM  Result Value Ref Range   Sodium 141 135 - 145 mmol/L   Potassium 4.5 3.5 - 5.1 mmol/L   Chloride 101 98 - 111 mmol/L    Comment: Please note change in reference range.   CO2 30 22 - 32 mmol/L   Glucose, Bld 143 (H) 70 - 99 mg/dL    Comment: Please note change in reference range.   BUN 8 8 - 23 mg/dL    Comment: Please note change in reference range.   Creatinine, Ser 0.76 0.44 - 1.00 mg/dL   Calcium 9.4 8.9 - 10.3 mg/dL   Total Protein 7.0 6.5 - 8.1 g/dL   Albumin 3.5 3.5 - 5.0 g/dL   AST 22 15 - 41 U/L   ALT 13 0 - 44 U/L    Comment: Please note change in reference range.   Alkaline Phosphatase 95 38 - 126 U/L   Total Bilirubin 0.8 0.3 - 1.2 mg/dL   GFR calc non Af Amer >60 >60 mL/min   GFR calc Af Amer >60 >60 mL/min    Comment: (NOTE) The eGFR has been calculated using the CKD EPI equation. This calculation has not been validated in all clinical situations. eGFR's persistently <60 mL/min signify possible Chronic Kidney Disease.    Anion gap 10 5 - 15    Comment: Performed at [facility], [place] [address], [place], [place] [address]  CBC with Differential     Status: Abnormal   Collection Time: 09/28/17 12:17 AM  Result Value Ref Range   WBC 11.9 (H) 4.0 - 10.5 K/uL   RBC 5.34 (H) 3.87 - 5.11 MIL/uL   Hemoglobin 15.9 (H) 12.0 - 15.0 g/dL   HCT 46.3 (H) 36.0 - 46.0 %   MCV 86.7 78.0 - 100.0 fL   MCH 29.8 26.0 - 34.0 pg   MCHC 34.3 30.0 - 36.0 g/dL   RDW 12.7 11.5 - 15.5 %   Platelets 200 150 - 400 K/uL   Neutrophils Relative % 83 %   Neutro Abs 9.8 (H) 1.7 - 7.7 K/uL   Lymphocytes Relative 11 %   Lymphs Abs 1.3 0.7 - 4.0 K/uL   Monocytes Relative 6 %   Monocytes Absolute 0.8 0.1 - 1.0 K/uL   Eosinophils Relative 0 %   Eosinophils Absolute 0.0 0.0 - 0.7 K/uL   Basophils Relative 0 %   Basophils  Absolute 0.0 0.0 - 0.1 K/uL  Comment: Performed at [facility], [place] [address], [place], [place] [address]  Lipase, blood     Status: None   Collection Time: 09/28/17 12:17 AM  Result Value Ref Range   Lipase 22 11 - 51 U/L    Comment: Performed at [facility], [place] [address], [place], [place] [address]  I-Stat CG4 Lactic Acid, ED     Status: None   Collection Time: 09/28/17 12:28 AM  Result Value Ref Range   Lactic Acid, Venous 1.86 0.5 - 1.9 mmol/L  POC occult blood, ED     Status: None   Collection Time: 09/28/17  1:16 AM  Result Value Ref Range   Fecal Occult Bld NEGATIVE NEGATIVE  Urinalysis, Routine w reflex microscopic     Status: Abnormal   Collection Time: 09/28/17  1:16 AM  Result Value Ref Range   Color, Urine YELLOW YELLOW   APPearance CLEAR CLEAR   Specific Gravity, Urine 1.009 1.005 - 1.030   pH 9.0 (H) 5.0 - 8.0   Glucose, UA NEGATIVE NEGATIVE mg/dL   Hgb urine dipstick NEGATIVE NEGATIVE   Bilirubin Urine NEGATIVE NEGATIVE   Ketones, ur NEGATIVE NEGATIVE mg/dL   Protein, ur NEGATIVE NEGATIVE mg/dL   Nitrite POSITIVE (A) NEGATIVE   Leukocytes, UA LARGE (A) NEGATIVE   RBC / HPF 0-5 0 - 5 RBC/hpf   WBC, UA 21-50 0 - 5 WBC/hpf   Bacteria, UA RARE (A) NONE SEEN   Mucus PRESENT    Hyaline Casts, UA PRESENT     Comment: Performed at [facility], [place] [address], [place], [place] [address]  POC occult blood, ED     Status: Abnormal   Collection Time: 09/28/17  1:26 AM  Result Value Ref Range   Fecal Occult Bld POSITIVE (A) NEGATIVE  Ammonia     Status: None   Collection Time: 09/28/17  2:58 AM  Result Value Ref Range   Ammonia 13 9 - 35 umol/L    Comment: Performed at [facility], [place] [address], [place], [place] [address]   Dg Chest 2 View  Result Date: 09/28/2017 CLINICAL DATA:  77 year old female with abdominal pain. EXAM: CHEST - 2 VIEW COMPARISON:  None. FINDINGS: Mild chronic  bronchitic changes. No focal consolidation, pleural effusion, or pneumothorax. The cardiac silhouette is within normal limits. No acute osseous pathology. IMPRESSION: No active cardiopulmonary disease. Electronically Signed   By: Anner Crete M.D.   On: 09/28/2017 00:48   Ct Head Wo Contrast  Result Date: 09/28/2017 CLINICAL DATA:  77 year old female with confusion. EXAM: CT HEAD WITHOUT CONTRAST TECHNIQUE: Contiguous axial images were obtained from the base of the skull through the vertex without intravenous contrast. COMPARISON:  Head CT dated 06/14/2016 FINDINGS: Evaluation is limited due to motion artifact as well as streak artifact caused by coiling material Brain: There is mild age-related atrophy and chronic microvascular ischemic changes. There is no acute intracranial hemorrhage. No mass effect or midline shift. No extra-axial fluid collection. Vascular: No hyperdense vessel or unexpected calcification. Skull: Normal. Negative for fracture or focal lesion. Sinuses/Orbits: No acute finding. Other: None IMPRESSION: No acute intracranial hemorrhage. Age-related atrophy and chronic microvascular ischemic changes. Electronically Signed   By: Anner Crete M.D.   On: 09/28/2017 04:05   Ct Abdomen Pelvis W Contrast  Result Date: 09/28/2017 CLINICAL DATA:  Constipation with rectal pain since yesterday. History of melanoma. EXAM: CT ABDOMEN AND PELVIS WITH CONTRAST TECHNIQUE: Multidetector CT imaging of  the abdomen and pelvis was performed using the standard protocol following bolus administration of intravenous contrast. CONTRAST:  152m ISOVUE-300 IOPAMIDOL (ISOVUE-300) INJECTION 61% COMPARISON:  Gallbladder ultrasound 02/26/2016. FINDINGS: Lower chest: Mild cardiomegaly without pericardial effusion. Aortic atherosclerosis is identified. Bibasilar atelectasis. No effusion or pneumothorax. Hepatobiliary: Probable Phrygian cap of the gallbladder without gallstones or secondary signs of acute  cholecystitis. Water attenuating lesions of the liver compatible cysts. No enhancing mass lesions or biliary dilatation. Pancreas: Atrophic pancreas without ductal dilatation or mass. Spleen: Normal Adrenals/Urinary Tract: Normal bilateral adrenal glands. Symmetric cortical enhancement of both kidneys with symmetric pyelograms on repeat delayed imaging. No hydroureteronephrosis. The urinary bladder is unremarkable for the degree of distention. Stomach/Bowel: Large stool ball measuring 7.8 x 6.4 x 9.3 cm in the rectum with rectal wall thickening and perirectal inflammation consistent with stercoral colitis. Scattered colonic diverticulosis with increased colonic stool retention consistent with constipation is also identified. Decompressed stomach with normal small bowel rotation. A few fluid-filled mildly distended small bowel loops may be secondary to peristaltic change. Enteritis is not entirely excluded but believed less likely. No findings of acute appendicitis. Vascular/Lymphatic: Mild aortoiliac and branch vessel atherosclerosis without aneurysm. No adenopathy. Reproductive: Left-sided predominantly intramural but slightly submucosal 12 mm in diameter fibroid. No adnexal mass. Other: No free air nor free fluid. Musculoskeletal: Left femoral nail fixation without complicating features. Inferior endplate compression of L1, chronic. Thoracolumbar spondylosis with evidence of prior lumbar spinal fusion at L4-5. IMPRESSION: 1. Cardiomegaly without pericardial effusion. 2. Water attenuating cysts most of which are too small to characterize within the liver. 3. Rectal thickening with perirectal inflammation and large stool ball consistent with stercoral colitis. Increased fecal retention within the colon compatible with constipation. 4. 12 mm uterine fibroid. 5. Thoracolumbar spondylosis with L4-5 fusion. Chronic inferior endplate compression of L1. Electronically Signed   By: DAshley RoyaltyM.D.   On: 09/28/2017 01:56     Pending Labs Unresulted Labs (From admission, onward)   Start     Ordered   10/05/17 0500  Creatinine, serum  (enoxaparin (LOVENOX)    CrCl >/= 30 ml/min)  Weekly,   R    Comments:  while on enoxaparin therapy    09/28/17 0603   09/29/17 0500  Hepatic function panel  Tomorrow morning,   R     09/28/17 0603   09/29/17 06314 Basic metabolic panel  Tomorrow morning,   R     09/28/17 0603   09/29/17 0500  CBC WITH DIFFERENTIAL  Tomorrow morning,   R     09/28/17 0603   09/28/17 0601  CBC  (enoxaparin (LOVENOX)    CrCl >/= 30 ml/min)  Once,   R    Comments:  Baseline for enoxaparin therapy IF NOT ALREADY DRAWN.  Notify MD if PLT < 100 K.    09/28/17 0603   09/28/17 0601  Creatinine, serum  (enoxaparin (LOVENOX)    CrCl >/= 30 ml/min)  Once,   R    Comments:  Baseline for enoxaparin therapy IF NOT ALREADY DRAWN.    09/28/17 0603   09/28/17 0217  Rapid urine drug screen (hospital performed)  STAT,   R     09/28/17 0216   09/28/17 0017  Urine culture  Add-on,   STAT     09/28/17 0016      Vitals/Pain Today's Vitals   09/28/17 0500 09/28/17 0600 09/28/17 0618 09/28/17 0633  BP: (!) 119/108 (!) 125/109 (!) 143/85   Pulse:    (Marland Kitchen  102  Resp:      Temp:      TempSrc:      SpO2:    97%  PainSc:        Isolation Precautions No active isolations  Medications Medications  iopamidol (ISOVUE-300) 61 % injection (has no administration in time range)  bisacodyl (DULCOLAX) EC tablet 5 mg (has no administration in time range)  aspirin EC tablet 81 mg (has no administration in time range)  gabapentin (NEURONTIN) capsule 200-300 mg (has no administration in time range)  methocarbamol (ROBAXIN) tablet 250 mg (has no administration in time range)  Oxycodone HCl TABS 10 mg (has no administration in time range)  senna-docusate (Senokot-S) tablet 1 tablet (has no administration in time range)  spironolactone (ALDACTONE) tablet 50 mg (has no administration in time range)  traZODone  (DESYREL) tablet 100 mg (has no administration in time range)  acetaminophen (TYLENOL) tablet 650 mg (has no administration in time range)    Or  acetaminophen (TYLENOL) suppository 650 mg (has no administration in time range)  ondansetron (ZOFRAN) tablet 4 mg (has no administration in time range)    Or  ondansetron (ZOFRAN) injection 4 mg (has no administration in time range)  enoxaparin (LOVENOX) injection 40 mg (has no administration in time range)  polyethylene glycol (MIRALAX / GLYCOLAX) packet 17 g (has no administration in time range)  cefTRIAXone (ROCEPHIN) 1 g in sodium chloride 0.9 % 100 mL IVPB (has no administration in time range)  LORazepam (ATIVAN) tablet 1 mg ( Oral See Alternative 09/28/17 0622)    Or  LORazepam (ATIVAN) injection 1 mg (1 mg Intravenous Given 09/28/17 0622)  thiamine (VITAMIN B-1) tablet 100 mg (has no administration in time range)    Or  thiamine (B-1) injection 100 mg (has no administration in time range)  folic acid (FOLVITE) tablet 1 mg (has no administration in time range)  multivitamin with minerals tablet 1 tablet (has no administration in time range)  LORazepam (ATIVAN) injection 0-4 mg (has no administration in time range)    Followed by  LORazepam (ATIVAN) injection 0-4 mg (has no administration in time range)  metroNIDAZOLE (FLAGYL) IVPB 500 mg (has no administration in time range)  iopamidol (ISOVUE-300) 61 % injection 100 mL (100 mLs Intravenous Contrast Given 09/28/17 0129)  sodium chloride 0.9 % bolus 500 mL (0 mLs Intravenous Stopped 09/28/17 0255)  HYDROmorphone (DILAUDID) injection 0.5 mg (0.5 mg Intravenous Given 09/28/17 0148)  cefTRIAXone (ROCEPHIN) 1 g in sodium chloride 0.9 % 100 mL IVPB (0 g Intravenous Stopped 09/28/17 0325)  sodium chloride 0.9 % bolus 1,000 mL (0 mLs Intravenous Stopped 09/28/17 0355)    Mobility walks with person assist

## 2017-09-28 NOTE — Progress Notes (Signed)
Attempted MRI, patient became combative and loud refusing to allow [place] to slide her onto the MRI table. Patient was given IV Ativan prior. Spoke with Dr Hal Hope who wants patient to be attempted later today with more Ativan.

## 2017-09-28 NOTE — Care Management Obs Status (Signed)
[place] NOTIFICATION   Patient Details  Name: Denise Kelly MRN: [id number] Date of Birth: 1940-03-18   Medicare Observation Status Notification Given:  Yes    Leeroy Cha, RN 09/28/2017, 10:51 AM

## 2017-09-29 DIAGNOSIS — T40605A Adverse effect of unspecified narcotics, initial encounter: Secondary | ICD-10-CM | POA: Diagnosis present

## 2017-09-29 DIAGNOSIS — Z66 Do not resuscitate: Secondary | ICD-10-CM | POA: Diagnosis present

## 2017-09-29 DIAGNOSIS — F329 Major depressive disorder, single episode, unspecified: Secondary | ICD-10-CM | POA: Diagnosis present

## 2017-09-29 DIAGNOSIS — K5901 Slow transit constipation: Secondary | ICD-10-CM | POA: Diagnosis present

## 2017-09-29 DIAGNOSIS — N390 Urinary tract infection, site not specified: Secondary | ICD-10-CM | POA: Diagnosis present

## 2017-09-29 DIAGNOSIS — H9190 Unspecified hearing loss, unspecified ear: Secondary | ICD-10-CM | POA: Diagnosis present

## 2017-09-29 DIAGNOSIS — G9349 Other encephalopathy: Secondary | ICD-10-CM | POA: Diagnosis present

## 2017-09-29 DIAGNOSIS — Z96653 Presence of artificial knee joint, bilateral: Secondary | ICD-10-CM | POA: Diagnosis present

## 2017-09-29 DIAGNOSIS — G8929 Other chronic pain: Secondary | ICD-10-CM | POA: Diagnosis present

## 2017-09-29 DIAGNOSIS — Y92009 Unspecified place in unspecified non-institutional (private) residence as the place of occurrence of the external cause: Secondary | ICD-10-CM | POA: Diagnosis not present

## 2017-09-29 DIAGNOSIS — Z87440 Personal history of urinary (tract) infections: Secondary | ICD-10-CM | POA: Diagnosis not present

## 2017-09-29 DIAGNOSIS — Z1619 Resistance to other specified beta lactam antibiotics: Secondary | ICD-10-CM | POA: Diagnosis present

## 2017-09-29 DIAGNOSIS — Z8249 Family history of ischemic heart disease and other diseases of the circulatory system: Secondary | ICD-10-CM | POA: Diagnosis not present

## 2017-09-29 DIAGNOSIS — Z981 Arthrodesis status: Secondary | ICD-10-CM | POA: Diagnosis not present

## 2017-09-29 DIAGNOSIS — E876 Hypokalemia: Secondary | ICD-10-CM | POA: Diagnosis not present

## 2017-09-29 DIAGNOSIS — G9340 Encephalopathy, unspecified: Secondary | ICD-10-CM | POA: Diagnosis present

## 2017-09-29 DIAGNOSIS — K5903 Drug induced constipation: Secondary | ICD-10-CM | POA: Diagnosis present

## 2017-09-29 DIAGNOSIS — M549 Dorsalgia, unspecified: Secondary | ICD-10-CM | POA: Diagnosis present

## 2017-09-29 DIAGNOSIS — F10239 Alcohol dependence with withdrawal, unspecified: Secondary | ICD-10-CM | POA: Diagnosis present

## 2017-09-29 DIAGNOSIS — F1020 Alcohol dependence, uncomplicated: Secondary | ICD-10-CM | POA: Diagnosis not present

## 2017-09-29 DIAGNOSIS — Z85820 Personal history of malignant melanoma of skin: Secondary | ICD-10-CM | POA: Diagnosis not present

## 2017-09-29 DIAGNOSIS — G9050 Complex regional pain syndrome I, unspecified: Secondary | ICD-10-CM | POA: Diagnosis present

## 2017-09-29 DIAGNOSIS — I10 Essential (primary) hypertension: Secondary | ICD-10-CM | POA: Diagnosis present

## 2017-09-29 DIAGNOSIS — Z96642 Presence of left artificial hip joint: Secondary | ICD-10-CM | POA: Diagnosis present

## 2017-09-29 DIAGNOSIS — B9689 Other specified bacterial agents as the cause of diseases classified elsewhere: Secondary | ICD-10-CM | POA: Diagnosis present

## 2017-09-29 DIAGNOSIS — Z79891 Long term (current) use of opiate analgesic: Secondary | ICD-10-CM | POA: Diagnosis not present

## 2017-09-29 DIAGNOSIS — I890 Lymphedema, not elsewhere classified: Secondary | ICD-10-CM | POA: Diagnosis present

## 2017-09-29 LAB — HEPATIC FUNCTION PANEL
ALK PHOS: 97 U/L (ref 38–126)
ALT: 14 U/L (ref 0–44)
AST: 22 U/L (ref 15–41)
Albumin: 3.4 g/dL — ABNORMAL LOW (ref 3.5–5.0)
BILIRUBIN DIRECT: 0.3 mg/dL — AB (ref 0.0–0.2)
BILIRUBIN INDIRECT: 0.6 mg/dL (ref 0.3–0.9)
Total Bilirubin: 0.9 mg/dL (ref 0.3–1.2)
Total Protein: 7 g/dL (ref 6.5–8.1)

## 2017-09-29 LAB — CBC WITH DIFFERENTIAL/PLATELET
BASOS ABS: 0 10*3/uL (ref 0.0–0.1)
Basophils Relative: 0 %
EOS ABS: 0 10*3/uL (ref 0.0–0.7)
EOS PCT: 0 %
HCT: 49.1 % — ABNORMAL HIGH (ref 36.0–46.0)
Hemoglobin: 16.4 g/dL — ABNORMAL HIGH (ref 12.0–15.0)
LYMPHS PCT: 14 %
Lymphs Abs: 2.4 10*3/uL (ref 0.7–4.0)
MCH: 29.3 pg (ref 26.0–34.0)
MCHC: 33.4 g/dL (ref 30.0–36.0)
MCV: 87.7 fL (ref 78.0–100.0)
MONO ABS: 1.1 10*3/uL — AB (ref 0.1–1.0)
Monocytes Relative: 7 %
Neutro Abs: 13.3 10*3/uL — ABNORMAL HIGH (ref 1.7–7.7)
Neutrophils Relative %: 79 %
PLATELETS: 193 10*3/uL (ref 150–400)
RBC: 5.6 MIL/uL — ABNORMAL HIGH (ref 3.87–5.11)
RDW: 13 % (ref 11.5–15.5)
WBC: 16.8 10*3/uL — ABNORMAL HIGH (ref 4.0–10.5)

## 2017-09-29 LAB — BASIC METABOLIC PANEL
Anion gap: 11 (ref 5–15)
BUN: 7 mg/dL — AB (ref 8–23)
CALCIUM: 8.7 mg/dL — AB (ref 8.9–10.3)
CO2: 26 mmol/L (ref 22–32)
CREATININE: 0.76 mg/dL (ref 0.44–1.00)
Chloride: 98 mmol/L (ref 98–111)
GFR calc Af Amer: >60 mL/min (ref >60)
GLUCOSE: 125 mg/dL — AB (ref 70–99)
POTASSIUM: 3.8 mmol/L (ref 3.5–5.1)
Sodium: 135 mmol/L (ref 135–145)

## 2017-09-29 MED ORDER — LABETALOL HCL 5 MG/ML IV SOLN
5.0000 mg | Freq: Once | INTRAVENOUS | Status: AC
  Administered 2017-09-29: 5 mg via INTRAVENOUS
  Filled 2017-09-29: qty 4

## 2017-09-29 NOTE — Progress Notes (Signed)
Confused/restless/intermittent psychomotor agitation throughout shift. Ativan 5mg  administered over 10 hours with min/mod relief. Refused all po meds

## 2017-09-29 NOTE — Progress Notes (Signed)
PROGRESS NOTE    Denise Kelly  CNO:[id number] DOB: December 02, 1940 DOA: 09/27/2017 PCP: [name], MD   Brief Narrative: Denise Kelly is a 77 y.o. female with history of chronic pain, alcohol abuse. She is being treated for confusion secondary to UTI vs alcohol withdrawal vs combination.   Assessment & Plan:   Principal Problem:   Acute encephalopathy Active Problems:   Alcohol dependence (HCC)   Chronic back pain   Constipation, slow transit   Acute encephalopathy Likely secondary to alcohol withdrawal vs UTI vs combination. GNR on preliminary urine culture. -CIWA -Ceftriaxone -Urine culture pending  Alcohol withdrawal Elevated CIWA scores -Continue CIWA  Recurrent UTI On Keflex as an outpatient. On hold.  History of alcoholism -CIWA as mentioned above  Chronic bilateral LE edema On spironolactone as an outpatient  Constipation In setting of chronic opiate use -Miralax  Chronic pain On chronic opiate.  Leukocytosis Slightly increased. Concern for possible infection.    DVT prophylaxis: SCDs Code Status:   Code Status: DNR Family Communication: None at telephone Disposition Plan: Discharge pending improvement of mental status   Consultants:   None  Procedures:   None  Antimicrobials:  Ceftriaxone    Subjective: Afebrile overnight. Confused. No agitation.  Objective: Vitals:   09/28/17 2005 09/29/17 0352 09/29/17 0453 09/29/17 0600  BP: (!) 125/99 (!) 159/107 (!) 155/103 (!) 148/95  Pulse: (!) 115 (!) 119    Resp: (!) 21 17    Temp: 98.2 F (36.8 C) 97.8 F (36.6 C)    TempSrc: Oral Oral    SpO2: 98% 97%      Intake/Output Summary (Last 24 hours) at 09/29/2017 1048 Last data filed at 09/29/2017 2947 Gross per 24 hour  Intake 1500 ml  Output -  Net 1500 ml   There were no vitals filed for this visit.  Examination:  General exam: Appears calm and comfortable Respiratory system: Clear to auscultation. Respiratory effort  normal. Cardiovascular system: S1 & S2 heard, RRR. No murmurs. Gastrointestinal system: Abdomen is nondistended, soft and nontender. Normal bowel sounds heard. Central nervous system: Somnolent. Extremities: No edema. No calf tenderness Skin: No cyanosis. No rashes    Data Reviewed: I have personally reviewed following labs and imaging studies  CBC: Recent Labs  Lab 09/28/17 0017 09/28/17 0844 09/29/17 0748  WBC 11.9* 14.7* 16.8*  NEUTROABS 9.8*  --  13.3*  HGB 15.9* 14.8 16.4*  HCT 46.3* 44.7 49.1*  MCV 86.7 86.5 87.7  PLT 200 196 654   Basic Metabolic Panel: Recent Labs  Lab 09/28/17 0017 09/29/17 0748  NA 141 135  K 4.5 3.8  CL 101 98  CO2 30 26  GLUCOSE 143* 125*  BUN 8 7*  CREATININE 0.76 0.76  CALCIUM 9.4 8.7*   GFR: CrCl cannot be calculated (Unknown ideal weight.). Liver Function Tests: Recent Labs  Lab 09/28/17 0017 09/29/17 0748  AST 22 22  ALT 13 14  ALKPHOS 95 97  BILITOT 0.8 0.9  PROT 7.0 7.0  ALBUMIN 3.5 3.4*   Recent Labs  Lab 09/28/17 0017  LIPASE 22   Recent Labs  Lab 09/28/17 0258  AMMONIA 13   Coagulation Profile: No results for input(s): INR, PROTIME in the last 168 hours. Cardiac Enzymes: No results for input(s): CKTOTAL, CKMB, CKMBINDEX, TROPONINI in the last 168 hours. BNP (last 3 results) No results for input(s): PROBNP in the last 8760 hours. HbA1C: No results for input(s): HGBA1C in the last 72 hours. CBG: No results for  input(s): GLUCAP in the last 168 hours. Lipid Profile: No results for input(s): CHOL, HDL, LDLCALC, TRIG, CHOLHDL, LDLDIRECT in the last 72 hours. Thyroid Function Tests: No results for input(s): TSH, T4TOTAL, FREET4, T3FREE, THYROIDAB in the last 72 hours. Anemia Panel: No results for input(s): VITAMINB12, FOLATE, FERRITIN, TIBC, IRON, RETICCTPCT in the last 72 hours. Sepsis Labs: Recent Labs  Lab 09/28/17 0028  LATICACIDVEN 1.86    Recent Results (from the past 240 hour(s))  Urine  culture     Status: Abnormal (Preliminary result)   Collection Time: 09/28/17  1:16 AM  Result Value Ref Range Status   Specimen Description   Final    URINE, RANDOM Performed at [facility], [place] [address] [place], [place] [address]    Special Requests   Final    NONE Performed at [facility], [place] [address], [place],  [address]    Culture >=100,000 COLONIES/mL GRAM NEGATIVE RODS (A)  Final   Report Status PENDING  Incomplete         Radiology Studies: Dg Chest 2 View  Result Date: 09/28/2017 CLINICAL DATA:  77 year old female with abdominal pain. EXAM: CHEST - 2 VIEW COMPARISON:  None. FINDINGS: Mild chronic bronchitic changes. No focal consolidation, pleural effusion, or pneumothorax. The cardiac silhouette is within normal limits. No acute osseous pathology. IMPRESSION: No active cardiopulmonary disease. Electronically Signed   By: Anner Crete M.D.   On: 09/28/2017 00:48   Ct Head Wo Contrast  Result Date: 09/28/2017 CLINICAL DATA:  77 year old female with confusion. EXAM: CT HEAD WITHOUT CONTRAST TECHNIQUE: Contiguous axial images were obtained from the base of the skull through the vertex without intravenous contrast. COMPARISON:  Head CT dated 06/14/2016 FINDINGS: Evaluation is limited due to motion artifact as well as streak artifact caused by coiling material Brain: There is mild age-related atrophy and chronic microvascular ischemic changes. There is no acute intracranial hemorrhage. No mass effect or midline shift. No extra-axial fluid collection. Vascular: No hyperdense vessel or unexpected calcification. Skull: Normal. Negative for fracture or focal lesion. Sinuses/Orbits: No acute finding. Other: None IMPRESSION: No acute intracranial hemorrhage. Age-related atrophy and chronic microvascular ischemic changes. Electronically Signed   By: Anner Crete M.D.   On: 09/28/2017 04:05   Mr Brain Wo Contrast  Result Date:  09/28/2017 CLINICAL DATA:  Initial evaluation for acute altered mental status. EXAM: MRI HEAD WITHOUT CONTRAST TECHNIQUE: Multiplanar, multiecho pulse sequences of the brain and surrounding structures were obtained without intravenous contrast. COMPARISON:  Prior CT from earlier same day. FINDINGS: Brain: Examination is markedly limited as the patient was unable to tolerate the full length of the exam. Axial DWI a sequences only were performed. DWI imaging demonstrates no evidence for acute or subacute infarct. Gray-white matter differentiation grossly maintained. No appreciable mass lesion or mass effect. No midline shift. Ventricles normal size without hydrocephalus. No appreciable extra-axial fluid collection. Vascular: Not assessed on this limited exam. Skull and upper cervical spine: Poorly assessed on this limited exam. Sinuses/Orbits: Not assessed on this limited exam. Other: None. IMPRESSION: 1. Limited study with only axial DWI sequence performed. 2. No evidence for acute or subacute infarct. No other obvious intracranial abnormality. Electronically Signed   By: Jeannine Boga M.D.   On: 09/28/2017 21:00   Ct Abdomen Pelvis W Contrast  Result Date: 09/28/2017 CLINICAL DATA:  Constipation with rectal pain since yesterday. History of melanoma. EXAM: CT ABDOMEN AND PELVIS WITH CONTRAST TECHNIQUE: Multidetector CT imaging of the abdomen and  pelvis was performed using the standard protocol following bolus administration of intravenous contrast. CONTRAST:  161mL ISOVUE-300 IOPAMIDOL (ISOVUE-300) INJECTION 61% COMPARISON:  Gallbladder ultrasound 02/26/2016. FINDINGS: Lower chest: Mild cardiomegaly without pericardial effusion. Aortic atherosclerosis is identified. Bibasilar atelectasis. No effusion or pneumothorax. Hepatobiliary: Probable Phrygian cap of the gallbladder without gallstones or secondary signs of acute cholecystitis. Water attenuating lesions of the liver compatible cysts. No enhancing mass  lesions or biliary dilatation. Pancreas: Atrophic pancreas without ductal dilatation or mass. Spleen: Normal Adrenals/Urinary Tract: Normal bilateral adrenal glands. Symmetric cortical enhancement of both kidneys with symmetric pyelograms on repeat delayed imaging. No hydroureteronephrosis. The urinary bladder is unremarkable for the degree of distention. Stomach/Bowel: Large stool ball measuring 7.8 x 6.4 x 9.3 cm in the rectum with rectal wall thickening and perirectal inflammation consistent with stercoral colitis. Scattered colonic diverticulosis with increased colonic stool retention consistent with constipation is also identified. Decompressed stomach with normal small bowel rotation. A few fluid-filled mildly distended small bowel loops may be secondary to peristaltic change. Enteritis is not entirely excluded but believed less likely. No findings of acute appendicitis. Vascular/Lymphatic: Mild aortoiliac and branch vessel atherosclerosis without aneurysm. No adenopathy. Reproductive: Left-sided predominantly intramural but slightly submucosal 12 mm in diameter fibroid. No adnexal mass. Other: No free air nor free fluid. Musculoskeletal: Left femoral nail fixation without complicating features. Inferior endplate compression of L1, chronic. Thoracolumbar spondylosis with evidence of prior lumbar spinal fusion at L4-5. IMPRESSION: 1. Cardiomegaly without pericardial effusion. 2. Water attenuating cysts most of which are too small to characterize within the liver. 3. Rectal thickening with perirectal inflammation and large stool ball consistent with stercoral colitis. Increased fecal retention within the colon compatible with constipation. 4. 12 mm uterine fibroid. 5. Thoracolumbar spondylosis with L4-5 fusion. Chronic inferior endplate compression of L1. Electronically Signed   By: Ashley Royalty M.D.   On: 09/28/2017 01:56        Scheduled Meds: . aspirin EC  81 mg Oral QHS  . folic acid  1 mg Oral Daily   . gabapentin  200 mg Oral Daily  . gabapentin  300 mg Oral QHS  . LORazepam  0-4 mg Intravenous Q6H   Followed by  . [START ON 09/30/2017] LORazepam  0-4 mg Intravenous Q12H  . multivitamin with minerals  1 tablet Oral Daily  . polyethylene glycol  17 g Oral BID  . senna-docusate  1 tablet Oral BID  . spironolactone  50 mg Oral BID  . thiamine  100 mg Oral Daily   Or  . thiamine  100 mg Intravenous Daily   Continuous Infusions: . cefTRIAXone (ROCEPHIN)  IV Stopped (09/29/17 0509)  . metronidazole Stopped (09/29/17 0509)     LOS: 0 days     Cordelia Poche, MD [facility] 09/29/2017, 10:48 AM Pager: [phone]  If 7PM-7AM, please contact night-coverage [email or web address] 09/29/2017, 10:48 AM

## 2017-09-30 DIAGNOSIS — G9340 Encephalopathy, unspecified: Secondary | ICD-10-CM

## 2017-09-30 MED ORDER — TRAZODONE HCL 50 MG PO TABS
50.0000 mg | ORAL_TABLET | Freq: Every day | ORAL | Status: DC | PRN
  Administered 2017-09-30 – 2017-10-02 (×3): 50 mg via ORAL
  Filled 2017-09-30 (×3): qty 1

## 2017-09-30 NOTE — Progress Notes (Signed)
PT Cancellation Note  Patient Details Name: Denise Kelly MRN: [id number] DOB: Oct 22, 1940   Cancelled Treatment:     PT order received but eval deferred - RN advises pt too lethargic to participate.  Will follow.   Jaynia Fendley 09/30/2017, 2:02 PM

## 2017-09-30 NOTE — Plan of Care (Signed)
  Problem: Safety: Goal: Ability to remain free from injury will improve Outcome: Progressing   

## 2017-09-30 NOTE — Progress Notes (Addendum)
PROGRESS NOTE    Denise Kelly  TKW:[id number] DOB: [birth year] DOA: 09/27/2017 PCP: [name], MD   Brief Narrative:  6 fem chronic bil lower extremity pain foll Dr. Lesleigh Noe and other work-up ned 10/2015] Prior L TKR and L THR previously on suppressive therapy with Keflex followed by Dr. Johnnye Sima Chronic lymphedema in the past Grade 1 spondylolisthesis L4-5 with severe spinal stenosis L4-5 status post decompressive laminectomy 07/2011 Dr. Saintclair Halsted alcohol abuse.  Hypertension Bipolar  She is being treated for confusion secondary to UTI vs alcohol withdrawal vs combination.   Assessment & Plan:   Principal Problem:   Acute encephalopathy Active Problems:   Alcohol dependence (HCC)   Chronic back pain   Constipation, slow transit   Acute encephalopathy -Likely secondary to alcohol withdrawal vs UTI vs combination.  -CIWA scale is lower but she is still slightly confused at times -She is on multiple medications that can cause encephalopathy as above--I have cut back her trazodone from 100-50 hs,  would leave her oxycodone place for now, might cut back on her Robaxin and other meds with potential for sedation  Reflex sympathetic neuropathy? Consider discussion with Dr. Maryjean Ka who she follows with for pain management    Alcohol withdrawal Elevated CIWA scores -Continue CIWA--looks like this is lower today  Recurrent UTI cultures have grown >100,000 Serratia marcescens -On Keflex 250 suppressive as an outpatient. On hold. -Doing ceftriaxone for now and narrow as appropriate given her recurrent issues  History of alcoholism -CIWA as mentioned above  Chronic bilateral LE edema On spironolactone as an outpatient  Constipation In setting of chronic opiate use -Miralax  Chronic pain On chronic opiates as above  Leukocytosis Slightly increased. Concern for possible infection.    DVT prophylaxis: SCDs Code Status:   Code Status: DNR Family Communication: Long  discussion with patient's daughter who is a paramedic and lives in the same apartment complex as the patient-patient exhibits poor self-care, doesn't shower at home lives alone and possibly has poor hygiene-I have explained the difficult situation of trying to manage multiple comorbid's in a setting of poor choices however I believe that patient mentally exhibits competence to make these choices-I will ask therapy to not emergently examined the patient and give an opinion however the daughter feels that patient will completely refuse going to a skilled facility-it appears that the patient drinks wine and gets it delivered from [employer]. Disposition Plan: Discharge pending improvement of mental status and labs not ready for discharge today possibly in the next 48 hours   Consultants:   None  Procedures:   None  Antimicrobials:  Ceftriaxone    Subjective:  Some confusion overnight. No new issue. Trying to eat and drink.  Objective: Vitals:   09/29/17 2158 09/30/17 0048 09/30/17 0500 09/30/17 0552  BP: (!) 152/83 (!) 140/91  (!) 126/91  Pulse: (!) 103 (!) 109  (!) 111  Resp: 16     Temp: 97.9 F (36.6 C)   98.2 F (36.8 C)  TempSrc: Axillary   Axillary  SpO2: 100% 98%  97%  Weight:   75 kg (165 lb 5.5 oz)     Intake/Output Summary (Last 24 hours) at 09/30/2017 1049 Last data filed at 09/29/2017 1849 Gross per 24 hour  Intake -  Output 400 ml  Net -400 ml   Filed Weights   09/30/17 0500  Weight: 75 kg (165 lb 5.5 oz)    Examination:  General exam: Appears sleepy but more awake and oriented  than she was previously per nursing Respiratory system: Clear to auscultation. Respiratory effort normal. Cardiovascular system: S1 & S2 heard, RRR. No murmurs. Gastrointestinal system: Abdomen is nondistended, soft and nontender. Normal bowel sounds heard. Central nervous system: Somnolent. Extremities: No edema. No calf tenderness--he does have some diffuse tenderness on the  right lower extremity Skin: No cyanosis. No rashes    Data Reviewed: I have personally reviewed following labs and imaging studies  CBC: Recent Labs  Lab 09/28/17 0017 09/28/17 0844 09/29/17 0748  WBC 11.9* 14.7* 16.8*  NEUTROABS 9.8*  --  13.3*  HGB 15.9* 14.8 16.4*  HCT 46.3* 44.7 49.1*  MCV 86.7 86.5 87.7  PLT 200 196 003   Basic Metabolic Panel: Recent Labs  Lab 09/28/17 0017 09/29/17 0748  NA 141 135  K 4.5 3.8  CL 101 98  CO2 30 26  GLUCOSE 143* 125*  BUN 8 7*  CREATININE 0.76 0.76  CALCIUM 9.4 8.7*   GFR: Estimated Creatinine Clearance: 64.3 mL/min (by C-G formula based on SCr of 0.76 mg/dL). Liver Function Tests: Recent Labs  Lab 09/28/17 0017 09/29/17 0748  AST 22 22  ALT 13 14  ALKPHOS 95 97  BILITOT 0.8 0.9  PROT 7.0 7.0  ALBUMIN 3.5 3.4*   Recent Labs  Lab 09/28/17 0017  LIPASE 22   Recent Labs  Lab 09/28/17 0258  AMMONIA 13   Coagulation Profile: No results for input(s): INR, PROTIME in the last 168 hours. Cardiac Enzymes: No results for input(s): CKTOTAL, CKMB, CKMBINDEX, TROPONINI in the last 168 hours. BNP (last 3 results) No results for input(s): PROBNP in the last 8760 hours. HbA1C: No results for input(s): HGBA1C in the last 72 hours. CBG: No results for input(s): GLUCAP in the last 168 hours. Lipid Profile: No results for input(s): CHOL, HDL, LDLCALC, TRIG, CHOLHDL, LDLDIRECT in the last 72 hours. Thyroid Function Tests: No results for input(s): TSH, T4TOTAL, FREET4, T3FREE, THYROIDAB in the last 72 hours. Anemia Panel: No results for input(s): VITAMINB12, FOLATE, FERRITIN, TIBC, IRON, RETICCTPCT in the last 72 hours. Sepsis Labs: Recent Labs  Lab 09/28/17 0028  LATICACIDVEN 1.86    Recent Results (from the past 240 hour(s))  Urine culture     Status: Abnormal (Preliminary result)   Collection Time: 09/28/17  1:16 AM  Result Value Ref Range Status   Specimen Description   Final    URINE, RANDOM Performed at  [facility], [place] [address], [place], [place] [address]    Special Requests   Final    NONE Performed at [facility], [place] [address], [place], [place] [address]    Culture (A)  Final    >=100,000 COLONIES/mL SERRATIA MARCESCENS SUSCEPTIBILITIES TO FOLLOW Performed at [facility], [place] [address] [place],  [address]    Report Status PENDING  Incomplete         Radiology Studies: Mr Brain Wo Contrast  Result Date: 09/28/2017 CLINICAL DATA:  Initial evaluation for acute altered mental status. EXAM: MRI HEAD WITHOUT CONTRAST TECHNIQUE: Multiplanar, multiecho pulse sequences of the brain and surrounding structures were obtained without intravenous contrast. COMPARISON:  Prior CT from earlier same day. FINDINGS: Brain: Examination is markedly limited as the patient was unable to tolerate the full length of the exam. Axial DWI a sequences only were performed. DWI imaging demonstrates no evidence for acute or subacute infarct. Gray-white matter differentiation grossly maintained. No appreciable mass lesion or mass effect. No midline shift. Ventricles normal  size without hydrocephalus. No appreciable extra-axial fluid collection. Vascular: Not assessed on this limited exam. Skull and upper cervical spine: Poorly assessed on this limited exam. Sinuses/Orbits: Not assessed on this limited exam. Other: None. IMPRESSION: 1. Limited study with only axial DWI sequence performed. 2. No evidence for acute or subacute infarct. No other obvious intracranial abnormality. Electronically Signed   By: Jeannine Boga M.D.   On: 09/28/2017 21:00        Scheduled Meds: . aspirin EC  81 mg Oral QHS  . folic acid  1 mg Oral Daily  . gabapentin  200 mg Oral Daily  . gabapentin  300 mg Oral QHS  . LORazepam  0-4 mg Intravenous Q12H  . multivitamin with minerals  1 tablet Oral Daily  . polyethylene glycol  17 g Oral BID  . senna-docusate  1 tablet Oral  BID  . spironolactone  50 mg Oral BID  . thiamine  100 mg Oral Daily   Continuous Infusions: . cefTRIAXone (ROCEPHIN)  IV Stopped (09/30/17 0045)     LOS: 1 day     Nita Sells, MD [facility] 09/30/2017, 10:49 AM Pager: [phone]  If 7PM-7AM, please contact night-coverage [email or web address] 09/30/2017, 10:49 AM

## 2017-10-01 LAB — CBC WITH DIFFERENTIAL/PLATELET
BASOS ABS: 0.1 10*3/uL (ref 0.0–0.1)
BASOS PCT: 0 %
EOS ABS: 0.3 10*3/uL (ref 0.0–0.7)
EOS PCT: 2 %
HEMATOCRIT: 46.3 % — AB (ref 36.0–46.0)
Hemoglobin: 15.6 g/dL — ABNORMAL HIGH (ref 12.0–15.0)
Lymphocytes Relative: 24 %
Lymphs Abs: 2.9 10*3/uL (ref 0.7–4.0)
MCH: 29.1 pg (ref 26.0–34.0)
MCHC: 33.7 g/dL (ref 30.0–36.0)
MCV: 86.2 fL (ref 78.0–100.0)
MONO ABS: 0.8 10*3/uL (ref 0.1–1.0)
MONOS PCT: 7 %
Neutro Abs: 7.9 10*3/uL — ABNORMAL HIGH (ref 1.7–7.7)
Neutrophils Relative %: 67 %
PLATELETS: 229 10*3/uL (ref 150–400)
RBC: 5.37 MIL/uL — ABNORMAL HIGH (ref 3.87–5.11)
RDW: 13.1 % (ref 11.5–15.5)
WBC: 11.9 10*3/uL — ABNORMAL HIGH (ref 4.0–10.5)

## 2017-10-01 LAB — URINE CULTURE: Culture: >=100000 — AB

## 2017-10-01 LAB — CARBAPENEM RESISTANCE PANEL
CARBA RESISTANCE IMP GENE: NOT DETECTED
CARBA RESISTANCE OXA48 GENE: NOT DETECTED
CARBA RESISTANCE VIM GENE: NOT DETECTED
Carba Resistance KPC Gene: NOT DETECTED
Carba Resistance NDM Gene: NOT DETECTED

## 2017-10-01 LAB — BASIC METABOLIC PANEL
ANION GAP: 9 (ref 5–15)
BUN: 12 mg/dL (ref 8–23)
CALCIUM: 8.4 mg/dL — AB (ref 8.9–10.3)
CO2: 26 mmol/L (ref 22–32)
CREATININE: 0.63 mg/dL (ref 0.44–1.00)
Chloride: 103 mmol/L (ref 98–111)
Glucose, Bld: 201 mg/dL — ABNORMAL HIGH (ref 70–99)
Potassium: 2.8 mmol/L — ABNORMAL LOW (ref 3.5–5.1)
Sodium: 138 mmol/L (ref 135–145)

## 2017-10-01 LAB — MAGNESIUM: Magnesium: 1.9 mg/dL (ref 1.7–2.4)

## 2017-10-01 MED ORDER — CHLORDIAZEPOXIDE HCL 5 MG PO CAPS: 5.0000 mg | ORAL_CAPSULE | Freq: Three times a day (TID) | ORAL | Status: DC | PRN

## 2017-10-01 MED ORDER — LOPERAMIDE HCL 2 MG PO CAPS
2.0000 mg | ORAL_CAPSULE | Freq: Two times a day (BID) | ORAL | Status: DC | PRN
  Administered 2017-10-01: 2 mg via ORAL
  Filled 2017-10-01: qty 1

## 2017-10-01 MED ORDER — DEXTROSE 5 % IV SOLN
2.5000 g | Freq: Three times a day (TID) | INTRAVENOUS | Status: DC
  Administered 2017-10-01: 2.5 g via INTRAVENOUS
  Filled 2017-10-01 (×2): qty 12

## 2017-10-01 MED ORDER — GABAPENTIN 100 MG PO CAPS
100.0000 mg | ORAL_CAPSULE | Freq: Every day | ORAL | Status: DC
  Administered 2017-10-01: 100 mg via ORAL
  Filled 2017-10-01: qty 1

## 2017-10-01 MED ORDER — CIPROFLOXACIN HCL 500 MG PO TABS
500.0000 mg | ORAL_TABLET | Freq: Two times a day (BID) | ORAL | Status: DC
  Administered 2017-10-01 – 2017-10-03 (×4): 500 mg via ORAL
  Filled 2017-10-01 (×4): qty 1

## 2017-10-01 MED ORDER — POTASSIUM CHLORIDE CRYS ER 20 MEQ PO TBCR
40.0000 meq | EXTENDED_RELEASE_TABLET | Freq: Two times a day (BID) | ORAL | Status: DC
  Administered 2017-10-01 – 2017-10-02 (×3): 40 meq via ORAL
  Filled 2017-10-01 (×3): qty 2

## 2017-10-01 MED ORDER — OXYCODONE HCL 5 MG PO TABS
5.0000 mg | ORAL_TABLET | Freq: Three times a day (TID) | ORAL | Status: DC | PRN
  Administered 2017-10-01 – 2017-10-03 (×5): 5 mg via ORAL
  Filled 2017-10-01 (×5): qty 1

## 2017-10-01 NOTE — Evaluation (Addendum)
Physical Therapy Evaluation Patient Details Name: Denise Kelly MRN: [id number] DOB: 24-May-1940 Today's Date: 10/01/2017   History of Present Illness  77 y.o. female with PMH of lumbar fusion L4-5, EtOH abuse, anxiety, HTN, B TKA, L IM nail 11/2015 admitted with acute encephalopathy, UTI, EtOH withdrawal.   Clinical Impression  Pt admitted with above diagnosis. Pt currently with functional limitations due to the deficits listed below (see PT Problem List). Min assist for bed mobility and transfers, pt ambulated 30' with RW, distance limited by frequent diarrhea. HR 132 with activity. May need ST-SNF if she lacks 44* supervision at home. Pt will benefit from skilled PT to increase their independence and safety with mobility to allow discharge to the venue listed below.    Addendum: pt stated she lives with her daughter. RN stated pt actually lives alone, daughter is in same apartment building but works.      Follow Up Recommendations SNF;Supervision/Assistance - 24 hour;Supervision for mobility/OOB    Equipment Recommendations  None recommended by PT    Recommendations for Other Services       Precautions / Restrictions Precautions Precautions: Fall Restrictions Weight Bearing Restrictions: No      Mobility  Bed Mobility Overal bed mobility: Needs Assistance Bed Mobility: Supine to Sit     Supine to sit: Min assist     General bed mobility comments: min A to raise trunk and advance BLEs  Transfers Overall transfer level: Needs assistance Equipment used: Rolling walker (2 wheeled) Transfers: Sit to/from Stand Sit to Stand: Min assist         General transfer comment: assist to rise/steady  Ambulation/Gait Ambulation/Gait assistance: Min guard Gait Distance (Feet): 30 Feet Assistive device: Rolling walker (2 wheeled) Gait Pattern/deviations: Step-through pattern     General Gait Details: distance limited by diarrhea (pt incontinenent while walking), no loss of  balance, HR 114 at rest, HR 132 walking  Stairs            Wheelchair Mobility    Modified Rankin (Stroke Patients Only)       Balance Overall balance assessment: Modified Independent                                           Pertinent Vitals/Pain Pain Assessment: Faces Faces Pain Scale: Hurts even more Pain Location: perineal area 2* frequent diarrhea Pain Descriptors / Indicators: Sore Pain Intervention(s): Limited activity within patient's tolerance;Monitored during session    Home Living Family/patient expects to be discharged to:: Skilled nursing facility                 Additional Comments: pt stated she lives with her daughter, walks with a RW, independent with sponge baths    Prior Function Level of Independence: Independent with assistive device(s)         Comments: walks with RW, independent sponge bathing     Hand Dominance   Dominant Hand: Right    Extremity/Trunk Assessment   Upper Extremity Assessment Upper Extremity Assessment: Generalized weakness    Lower Extremity Assessment Lower Extremity Assessment: Generalized weakness(B knee ext -4/5)    Cervical / Trunk Assessment Cervical / Trunk Assessment: Normal  Communication   Communication: No difficulties  Cognition Arousal/Alertness: Awake/alert Behavior During Therapy: WFL for tasks assessed/performed Overall Cognitive Status: No family/caregiver present to determine baseline cognitive functioning  General Comments: able to follow commands, some decreased awareness of deficits (continued to walk while having BM)      General Comments      Exercises     Assessment/Plan    PT Assessment Patient needs continued PT services  PT Problem List Decreased strength;Decreased mobility;Decreased activity tolerance;Cardiopulmonary status limiting activity       PT Treatment Interventions Gait training;Therapeutic  activities;Therapeutic exercise;Patient/family education    PT Goals (Current goals can be found in the Care Plan section)  Acute Rehab PT Goals Patient Stated Goal: likes to weave baskets PT Goal Formulation: With patient Time For Goal Achievement: 10/15/17 Potential to Achieve Goals: Fair    Frequency Min 2X/week   Barriers to discharge        Co-evaluation               AM-PAC PT "6 Clicks" Daily Activity  Outcome Measure Difficulty turning over in bed (including adjusting bedclothes, sheets and blankets)?: A Lot Difficulty moving from lying on back to sitting on the side of the bed? : Unable Difficulty sitting down on and standing up from a chair with arms (e.g., wheelchair, bedside commode, etc,.)?: Unable Help needed moving to and from a bed to chair (including a wheelchair)?: A Little Help needed walking in hospital room?: A Little Help needed climbing 3-5 steps with a railing? : A Lot 6 Click Score: 12    End of Session Equipment Utilized During Treatment: Gait belt Activity Tolerance: Treatment limited secondary to medical complications (Comment)(active diarrhea, HR 132 with walking) Patient left: in bed;with call bell/phone within reach;with bed alarm set Nurse Communication: Mobility status PT Visit Diagnosis: Difficulty in walking, not elsewhere classified (R26.2)    Time: 8921-1941 PT Time Calculation (min) (ACUTE ONLY): 27 min   Charges:   PT Evaluation $PT Eval Low Complexity: 1 Low PT Treatments $Gait Training: 8-22 mins   PT G Codes:          Philomena Doheny 10/01/2017, 3:03 PM [phone]

## 2017-10-01 NOTE — Progress Notes (Signed)
CRITICAL VALUE ALERT  Critical Value:  Urine culture + for Serratia marcesens   Date & Time Notied:  10/01/17 0843  Provider Notified: Discussed results with Dr. Verlon Au during rounds.  Orders Received/Actions taken: Changed antibiotics.

## 2017-10-01 NOTE — Progress Notes (Signed)
PROGRESS NOTE    Denise Kelly  [id number]:[id number] DOB: Mar 08, 1941 DOA: 09/27/2017 PCP: [name], MD   Brief Narrative:  62 fem chronic bil lower extremity pain foll Dr. Lesleigh Noe and other work-up ned 10/2015] Prior L TKR and L THR previously on suppressive therapy with Keflex followed by Dr. Johnnye Sima in the past Chronic lymphedema in the past Grade 1 spondylolisthesis L4-5 with severe spinal stenosis L4-5 status post decompressive laminectomy 07/2011 Dr. Saintclair Halsted alcohol abuse.  Hypertension Bipolar  She is being treated for confusion secondary to UTI vs alcohol withdrawal vs combination.   Assessment & Plan:   Principal Problem:   Acute encephalopathy Active Problems:   Alcohol dependence (HCC)   Chronic back pain   Constipation, slow transit   Acute encephalopathy -Likely secondary to alcohol withdrawal vs UTI vs combination.  -CIWA improved although still slightly confused at times -She is on multiple medications that can cause encephalopathy as above--I have cut back her trazodone from 100-50 hs, oxycodone 10-->5 cut off daytime gabapentin from 200-100 and mentation has steadily improved  Reflex sympathetic dystrophy? Consider discussion with Dr. 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 On hold. -Discontinued ceftriaxone placed on ceftazadime aviBactam as per discussion with Dr. Johnnye Sima will probably send home on outpatient course of fosfomycin  History of alcoholism -CIWA as mentioned above  Chronic bilateral LE edema On spironolactone as an outpatient  Constipation In setting of chronic opiate use -Miralax  Chronic pain On chronic opiates as above  Leukocytosis Slightly  increased. Concern for possible infection.    DVT prophylaxis: SCDs Code Status:   Code Status: DNR Family Communication: No family present today Disposition Plan: Discharge pending improvement of mental status and labs not ready for discharge today possibly in the next 48 hours   Consultants:   None  Procedures:   None  Antimicrobials:  Ceftriaxone    Subjective:  Awake alert no distress Much more oriented although does not remember me from yesterday Nurse tech reports that she is not really eating She has had unformed but not watery stools that are brown in color today  Objective: Vitals:   09/30/17 0552 09/30/17 1114 09/30/17 1951 10/01/17 0455  BP: (!) 126/91  125/90 (!) 148/97  Pulse: (!) 111 (!) 124 (!) 117 (!) 118  Resp:   17 18  Temp: 98.2 F (36.8 C)  98.4 F (36.9 C) 97.7 F (36.5 C)  TempSrc: Axillary  Axillary Oral  SpO2: 97% 92% 95% 94%  Weight:        Intake/Output Summary (Last 24 hours) at 10/01/2017 1402 Last data filed at 09/30/2017 2300 Gross per 24 hour  Intake 336.67 ml  Output -  Net 336.67 ml   Filed Weights   09/30/17 0500  Weight: 75 kg (165 lb 5.5 oz)    Examination:  General exam:  more awake and oriented  Respiratory system: Clear to auscultation. Respiratory effort normal. Cardiovascular system: S1 & S2 heard, RRR. No murmurs. Gastrointestinal system: Abdomen is nondistended, soft and nontender. Normal bowel sounds heard. Central nervous system: Somnolent. Extremities: +some diffuse tenderness on the right lower extremity Skin: No cyanosis. No rashes    Data Reviewed: I have personally reviewed following labs and imaging studies  CBC: Recent Labs  Lab  09/28/17 0017 09/28/17 0844 09/29/17 0748 10/01/17 0634  WBC 11.9* 14.7* 16.8* 11.9*  NEUTROABS 9.8*  --  13.3* 7.9*  HGB 15.9* 14.8 16.4* 15.6*  HCT 46.3* 44.7 49.1* 46.3*  MCV 86.7 86.5 87.7 86.2  PLT 200 196 193 681   Basic Metabolic Panel: Recent Labs    Lab 09/28/17 0017 09/29/17 0748 10/01/17 0634  NA 141 135 138  K 4.5 3.8 2.8*  CL 101 98 103  CO2 30 26 26   GLUCOSE 143* 125* 201*  BUN 8 7* 12  CREATININE 0.76 0.76 0.63  CALCIUM 9.4 8.7* 8.4*  MG  --   --  1.9   GFR: Estimated Creatinine Clearance: 64.3 mL/min (by C-G formula based on SCr of 0.63 mg/dL). Liver Function Tests: Recent Labs  Lab 09/28/17 0017 09/29/17 0748  AST 22 22  ALT 13 14  ALKPHOS 95 97  BILITOT 0.8 0.9  PROT 7.0 7.0  ALBUMIN 3.5 3.4*   Recent Labs  Lab 09/28/17 0017  LIPASE 22   Recent Labs  Lab 09/28/17 0258  AMMONIA 13   Coagulation Profile: No results for input(s): INR, PROTIME in the last 168 hours. Cardiac Enzymes: No results for input(s): CKTOTAL, CKMB, CKMBINDEX, TROPONINI in the last 168 hours. BNP (last 3 results) No results for input(s): PROBNP in the last 8760 hours. HbA1C: No results for input(s): HGBA1C in the last 72 hours. CBG: No results for input(s): GLUCAP in the last 168 hours. Lipid Profile: No results for input(s): CHOL, HDL, LDLCALC, TRIG, CHOLHDL, LDLDIRECT in the last 72 hours. Thyroid Function Tests: No results for input(s): TSH, T4TOTAL, FREET4, T3FREE, THYROIDAB in the last 72 hours. Anemia Panel: No results for input(s): VITAMINB12, FOLATE, FERRITIN, TIBC, IRON, RETICCTPCT in the last 72 hours. Sepsis Labs: Recent Labs  Lab 09/28/17 0028  LATICACIDVEN 1.86    Radiogy Studies: No results found.     Seduled Meds: . aspirin EC  81 mg Oral QHS  . gabapentin  100 mg Oral QHS  . gabapentin  200 mg Oral Daily  . potassium chloride  40 mEq Oral BID  . spironolactone  50 mg Oral BID  . thiamine  100 mg Oral Daily   Continuous Infusions: . ceftazidime avibactam (AVYCAZ) IVPB       LOS: 2 days     Nita Sells, MD [facility] 10/01/2017, 2:02 PM Pager: [phone]  If 7PM-7AM, please contact night-coverage [email or web address] 10/01/2017, 2:02 PM

## 2017-10-02 LAB — BASIC METABOLIC PANEL
ANION GAP: 10 (ref 5–15)
BUN: 8 mg/dL (ref 8–23)
CO2: 25 mmol/L (ref 22–32)
Calcium: 8.7 mg/dL — ABNORMAL LOW (ref 8.9–10.3)
Chloride: 102 mmol/L (ref 98–111)
Creatinine, Ser: 0.67 mg/dL (ref 0.44–1.00)
GFR calc Af Amer: >60 mL/min (ref >60)
Glucose, Bld: 107 mg/dL — ABNORMAL HIGH (ref 70–99)
POTASSIUM: 4.2 mmol/L (ref 3.5–5.1)
SODIUM: 137 mmol/L (ref 135–145)

## 2017-10-02 LAB — CBC WITH DIFFERENTIAL/PLATELET
BASOS ABS: 0.1 10*3/uL (ref 0.0–0.1)
Basophils Relative: 1 %
EOS ABS: 0.4 10*3/uL (ref 0.0–0.7)
EOS PCT: 4 %
HCT: 43.4 % (ref 36.0–46.0)
Hemoglobin: 14.5 g/dL (ref 12.0–15.0)
Lymphocytes Relative: 41 %
Lymphs Abs: 4.7 10*3/uL — ABNORMAL HIGH (ref 0.7–4.0)
MCH: 28.7 pg (ref 26.0–34.0)
MCHC: 33.4 g/dL (ref 30.0–36.0)
MCV: 85.8 fL (ref 78.0–100.0)
MONO ABS: 1.2 10*3/uL — AB (ref 0.1–1.0)
Monocytes Relative: 10 %
Neutro Abs: 5.1 10*3/uL (ref 1.7–7.7)
Neutrophils Relative %: 44 %
PLATELETS: 252 10*3/uL (ref 150–400)
RBC: 5.06 MIL/uL (ref 3.87–5.11)
RDW: 13 % (ref 11.5–15.5)
WBC: 11.4 10*3/uL — AB (ref 4.0–10.5)

## 2017-10-02 MED ORDER — GABAPENTIN 100 MG PO CAPS
200.0000 mg | ORAL_CAPSULE | Freq: Two times a day (BID) | ORAL | Status: DC
  Administered 2017-10-02 – 2017-10-03 (×2): 200 mg via ORAL
  Filled 2017-10-02 (×2): qty 2

## 2017-10-02 MED ORDER — CHLORDIAZEPOXIDE HCL 5 MG PO CAPS: 5.0000 mg | ORAL_CAPSULE | Freq: Two times a day (BID) | ORAL | Status: DC | PRN

## 2017-10-02 MED ORDER — ZINC OXIDE 12.8 % EX OINT
TOPICAL_OINTMENT | CUTANEOUS | Status: DC | PRN
  Filled 2017-10-02: qty 56.7

## 2017-10-02 MED ORDER — GABAPENTIN 100 MG PO CAPS: 200.0000 mg | ORAL_CAPSULE | Freq: Every day | ORAL | Status: DC

## 2017-10-02 NOTE — Progress Notes (Signed)
PROGRESS NOTE    Denise Kelly  [id number]:[id number] DOB: [birth year] DOA: 09/27/2017 PCP: [name], MD   Brief Narrative:  74 fem chronic bil lower extremity pain foll Dr. Lesleigh Noe and other work-up ned 10/2015] Prior L TKR and L THR previously on suppressive therapy with Keflex followed by Dr. Johnnye Sima in the past Chronic lymphedema in the past Grade 1 spondylolisthesis L4-5 with severe spinal stenosis L4-5 status post decompressive laminectomy 07/2011 Dr. Saintclair Halsted alcohol abuse.  Hypertension Bipolar  She is being treated for confusion secondary to UTI vs alcohol withdrawal vs combination.   Assessment & Plan:   Principal Problem:   Acute encephalopathy Active Problems:   Alcohol dependence (HCC)   Chronic back pain   Constipation, slow transit   Acute encephalopathy -Likely secondary to alcohol withdrawal vs UTI vs combination.  -CIWA improved although still slightly confused at times -She is on multiple medications that can cause encephalopathy as above--I have cut back her trazodone from 100-50 hs, oxycodone 10-->5  Gabapentin was readjusted at patient's persistence back to 200 mg daily on 719  Reflex sympathetic dystrophy? Consider discussion with Dr. 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 On hold. -Discontinued ceftriaxone placed on ceftazadime aviBactam as per discussion with Dr. Johnnye Sima further discussion reveals this is just Carbapenem resistant and not carb append amaze resistant therefore switched to ciprofloxacin to complete 7 days  total  History of alcoholism -CIWA as mentioned above  Chronic bilateral LE edema On spironolactone as an outpatient usually takes this twice daily for edema As an outpatient adjust the same  Constipation In setting of chronic opiate use -Miralax  Chronic pain On chronic opiates as above  Leukocytosis Slightly increased.   Hypokalemia Was as low as 2.3-currently 4.2-discontinue K. Dur 40 twice daily 7/19  DVT prophylaxis: SCDs Code Status:   Code Status: DNR Family Communication: Long discussion at the bedside with the patient's daughter who is a paramedic disposition Plan: Discharge a.m. to skilled facility [facility] 7/20 if continues to be stable-social work aware  Consultants:   None  Procedures:   None  Antimicrobials:  Ceftriaxone    Subjective:  Awake alert much more oriented Complaining of tingling in the right leg-asking for Robaxin No other issues at this time  Objective: Vitals:   10/01/17 2041 10/02/17 0300 10/02/17 0558 10/02/17 1108  BP: 140/90  (!) 148/98   Pulse: (!) 109  (!) 104   Resp: 18  19   Temp: 98.2 F (36.8 C)  98 F (36.7 C)   TempSrc: Oral  Oral   SpO2: 96%  96%   Weight:  74.4 kg (164 lb)  74.4 kg (164 lb)  Height:    5\' 6"  (1.676 m)    Intake/Output Summary (Last 24 hours) at 10/02/2017 1531 Last data filed at 10/02/2017 1241 Gross per 24 hour  Intake 357 ml  Output 500 ml  Net -143 ml   Filed Weights   09/30/17 0500 10/02/17 0300 10/02/17 1108  Weight: 75 kg (165 lb 5.5 oz) 74.4 kg (164 lb) 74.4 kg (164 lb)    Examination:  General exam:  more awake and oriented  Respiratory system: Clear to auscultation. Respiratory effort normal. Cardiovascular system: S1 & S2 heard, RRR. No murmurs. Gastrointestinal system: Abdomen is nondistended, soft and nontender. Normal bowel sounds heard. Central nervous system: Somnolent. Extremities: +some diffuse tenderness on the right lower extremity Skin: No cyanosis. No  rashes    Data Reviewed: I have personally reviewed following labs and imaging studies  CBC: Recent Labs  Lab 09/28/17 0017 09/28/17 0844 09/29/17 0748 10/01/17 0634 10/02/17 0640  WBC 11.9* 14.7* 16.8* 11.9* 11.4*  NEUTROABS 9.8*  --  13.3* 7.9* 5.1  HGB 15.9* 14.8 16.4* 15.6* 14.5  HCT 46.3* 44.7 49.1* 46.3* 43.4  MCV 86.7 86.5 87.7 86.2 85.8  PLT 200 196 193 229 638   Basic Metabolic Panel: Recent Labs  Lab 09/28/17 0017 09/29/17 0748 10/01/17 0634 10/02/17 0640  NA 141 135 138 137  K 4.5 3.8 2.8* 4.2  CL 101 98 103 102  CO2 30 26 26 25   GLUCOSE 143* 125* 201* 107*  BUN 8 7* 12 8  CREATININE 0.76 0.76 0.63 0.67  CALCIUM 9.4 8.7* 8.4* 8.7*  MG  --   --  1.9  --    GFR: Estimated Creatinine Clearance: 62.6 mL/min (by C-G formula based on SCr of 0.67 mg/dL). Liver Function Tests: Recent Labs  Lab 09/28/17 0017 09/29/17 0748  AST 22 22  ALT 13 14  ALKPHOS 95 97  BILITOT 0.8 0.9  PROT 7.0 7.0  ALBUMIN 3.5 3.4*   Recent Labs  Lab 09/28/17 0017  LIPASE 22   Recent Labs  Lab 09/28/17 0258  AMMONIA 13   Coagulation Profile: No results for input(s): INR, PROTIME in the last 168 hours. Cardiac Enzymes: No results for input(s): CKTOTAL, CKMB, CKMBINDEX, TROPONINI in the last 168 hours. BNP (last 3 results) No results for input(s): PROBNP in the last 8760 hours. HbA1C: No results for input(s): HGBA1C in the last 72 hours. CBG: No results for input(s): GLUCAP in the last 168 hours. Lipid Profile: No results for input(s): CHOL, HDL, LDLCALC, TRIG, CHOLHDL, LDLDIRECT in the last 72 hours. Thyroid Function Tests: No results for input(s): TSH, T4TOTAL, FREET4, T3FREE, THYROIDAB in the last 72 hours. Anemia Panel: No results for input(s): VITAMINB12, FOLATE, FERRITIN, TIBC, IRON, RETICCTPCT in the last 72 hours. Sepsis Labs: Recent Labs  Lab 09/28/17 0028  LATICACIDVEN 1.86    Radiogy Studies: No results found.     Seduled Meds: . aspirin EC   81 mg Oral QHS  . ciprofloxacin  500 mg Oral BID  . gabapentin  200 mg Oral BID  . potassium chloride  40 mEq Oral BID  . spironolactone  50 mg Oral BID  . thiamine  100 mg Oral Daily   Continuous Infusions:    LOS: 3 days     Nita Sells, MD [facility] 10/02/2017, 3:31 PM Pager: [phone]  If 7PM-7AM, please contact night-coverage [email or web address] 10/02/2017, 3:31 PM

## 2017-10-02 NOTE — Clinical Social Work Note (Signed)
Clinical Social Work Assessment  Patient Details  Name: Denise Kelly MRN: [id number] Date of Birth: 1940/07/09  Date of referral:  10/02/17               Reason for consult:  Facility Placement                Permission sought to share information with:  Case Manager, Customer service manager, Family Supports Permission granted to share information::  Yes, Verbal Permission Granted  Name::     Arts administrator::  SNF  Relationship::  daughter  Contact Information:     Housing/Transportation Living arrangements for the past 2 months:  Apartment Source of Information:  Patient Patient Interpreter Needed:  None Criminal Activity/Legal Involvement Pertinent to Current Situation/Hospitalization:  No - Comment as needed Significant Relationships:  Adult Children Lives with:  Self Do you feel safe going back to the place where you live?  Yes Need for family participation in patient care:  Yes (Comment)  Care giving concerns:  No care giving concerns at the time of assessment.    Social Worker assessment / plan:  LCSW following for facility placement.   PT recommends SNF for rehab. Patient and family agreeable to SNF for rehab. Patient prefers [facility] as she has been there before.   Patient reports she lives in the same apt building as her daughter, though she lives alone. Patient states that she doesn't do much cooking or cleaning. Patient reports that she does not require assistance with bathing or dressing. Patient does not have stairs in her home.   PLAN: SNF at dc.   Employment status:  Retired Forensic scientist:  Medicare PT Recommendations:  [place] / Referral to community resources:     Patient/Family's Response to care:  Patient and family thankful for LCSW visit.   Patient/Family's Understanding of and Emotional Response to Diagnosis, Current Treatment, and Prognosis:  Patient and family realistic about patient dc needs and are  agreeable to SNF for rehab at dc.   Emotional Assessment Appearance:  Appears stated age Attitude/Demeanor/Rapport:    Affect (typically observed):  Accepting, Calm Orientation:  Oriented to Self, Oriented to Place, Oriented to  Time, Oriented to Situation Alcohol / Substance use:  Alcohol Use Psych involvement (Current and /or in the community):  No (Comment)  Discharge Needs  Concerns to be addressed:  No discharge needs identified Readmission within the last 30 days:  No Current discharge risk:  None Barriers to Discharge:  No SNF bed   Servando Snare, LCSW 10/02/2017, 12:32 PM

## 2017-10-02 NOTE — Progress Notes (Signed)
Pt. Refused to ambulate to chair. Attempted x 2.

## 2017-10-02 NOTE — Care Management Important Message (Addendum)
Important Message  Patient Details IM Letter given to Rhonda/Case Manager to present to the Patient Name: NOVELLA ABRAHA MRN: [id number] Date of Birth: 09-05-1940   Medicare Important Message Given:  Yes    Kerin Salen 10/02/2017, 11:46 AMImportant Message  Patient Details  Name: REINE BRISTOW MRN: [id number] Date of Birth: 14-Aug-1940   Medicare Important Message Given:  Yes    Kerin Salen 10/02/2017, 11:46 AM

## 2017-10-03 MED ORDER — LOPERAMIDE HCL 2 MG PO CAPS: 2.0000 mg | ORAL_CAPSULE | Freq: Two times a day (BID) | ORAL | 0 refills | Status: DC | PRN

## 2017-10-03 MED ORDER — GABAPENTIN 100 MG PO CAPS: 200.0000 mg | ORAL_CAPSULE | Freq: Two times a day (BID) | ORAL | 0 refills | Status: DC

## 2017-10-03 MED ORDER — OXYCODONE HCL 5 MG PO TABS: 5.0000 mg | ORAL_TABLET | Freq: Four times a day (QID) | ORAL | 0 refills | Status: DC | PRN

## 2017-10-03 MED ORDER — CIPROFLOXACIN HCL 500 MG PO TABS: 500.0000 mg | ORAL_TABLET | Freq: Two times a day (BID) | ORAL | 0 refills | Status: DC

## 2017-10-03 MED ORDER — ZINC OXIDE 12.8 % EX OINT: TOPICAL_OINTMENT | CUTANEOUS | 0 refills | Status: DC | PRN

## 2017-10-03 MED ORDER — TRAZODONE HCL 50 MG PO TABS: 50.0000 mg | ORAL_TABLET | Freq: Every day | ORAL | 0 refills | Status: DC | PRN

## 2017-10-03 NOTE — Clinical Social Work Placement (Signed)
Pt discharging today- admitting to [facility] SNF- Report [phone]. Arranged [facility] transportation. DC information provided via the [place]. Daughter informed and agreeable   CLINICAL SOCIAL WORK PLACEMENT  NOTE  Date:  10/03/2017  Patient Details  Name: Denise Kelly MRN: [id number] Date of Birth: 1940/10/30  Clinical Social Work is seeking post-discharge placement for this patient at the [place] level of care (*CSW will initial, date and re-position this form in  chart as items are completed):  Yes   Patient/family provided with [place] Work Department's list of facilities offering this level of care within the geographic area requested by the patient (or if unable, by the patient's family).  Yes   Patient/family informed of their freedom to choose among providers that offer the needed level of care, that participate in Medicare, Medicaid or managed care program needed by the patient, have an available bed and are willing to accept the patient.  Yes   Patient/family informed of [facility]'s ownership interest in [facility] and [facility], as well as of the fact that they are under no obligation to receive care at these facilities.  PASRR submitted to EDS on       PASRR number received on       Existing PASRR number confirmed on 10/02/17     FL2 transmitted to all facilities in geographic area requested by pt/family on 10/02/17     FL2 transmitted to all facilities within larger geographic area on       Patient informed that his/her managed care company has contracts with or will negotiate with certain facilities, including the following:        Yes   Patient/family informed of bed offers received.  Patient chooses bed at [facility]     Physician recommends and patient chooses bed at [facility]    Patient to be transferred to [facility] on 10/03/17.  Patient to be transferred to facility by [facility]     Patient family notified  on 10/03/17 of transfer.  Name of family member notified:  daughter [facility]     PHYSICIAN       Additional Comment:    _______________________________________________ Nila Nephew, LCSW 10/03/2017, 2:09 PM [phone] weekend coverage

## 2017-10-03 NOTE — Progress Notes (Signed)
Writer has given report to receiving nurse at [facility].

## 2017-10-03 NOTE — Discharge Summary (Signed)
Physician Discharge Summary  Denise Kelly PJA:[id number] DOB: [birth year] DOA: 09/27/2017  PCP: [name], MD  Admit date: 09/27/2017 Discharge date: 10/03/2017  Time spent: 45 minutes  Recommendations for Outpatient Follow-up:  1. Please screen the patient with the PHQ 2 or PHQ-9 as an outpatient in 2 weeks for depression 2. I would recommend follow-up with Dr. Maryjean Ka patient's pain physician regarding de-escalation versus alternative therapies for pain management 3. Please see change in MAR from prior doses of opiates and other medications with propensity to cause encephalopathy-consider de-escalation further off of opioids if possible under guidance of pain physician as above 4. Recommend 2 more days of ciprofloxacin for Serratia urinary infection 5. Recommend basic metabolic panel and CBC in 1 week 6. Consider discontinuation of Imodium once stool normalizes 7. Once ciprofloxacin has been completed may reinitiate suppressive therapy with Keflex for prior complicated infection of hip/knee-prior dose was 250 daily  Discharge Diagnoses:  Principal Problem:   Acute encephalopathy Active Problems:   Alcohol dependence (HCC)   Chronic back pain   Constipation, slow transit   Discharge Condition: Improved  Diet recommendation: Heart healthy  Filed Weights   09/30/17 0500 10/02/17 0300 10/02/17 1108  Weight: 75 kg (165 lb 5.5 oz) 74.4 kg (164 lb) 74.4 kg (164 lb)    History of present illness:  76 fem chronic bil lower extremity pain foll Dr. Lesleigh Noe and other work-up ned 10/2015] Prior L TKR and L THR previously on suppressive therapy with Keflex followed by Dr. Johnnye Sima in the past Chronic lymphedema in the past Grade 1 spondylolisthesis L4-5 with severe spinal stenosis L4-5 status post decompressive laminectomy 07/2011 Dr. Saintclair Halsted alcohol abuse.  Hypertension Bipolar  She is being treated for confusion secondary to UTI vs alcohol withdrawal vs combination.     Hospital  Course:  Acute encephalopathy -Likely secondary to alcohol withdrawal vs UTI vs combination.  -CIWA improved although still slightly confused at times -She is on multiple medications that can cause encephalopathy as above--I have cut back her trazodone from 100-50 hs, oxycodone 10-->5  Gabapentin was readjusted at patient's persistence back to 200 mg daily on 719 Would not escalate medications be on MAR at this time instead will need input from her pain physician as dictated above  Reflex sympathetic dystrophy? Consider discussion with Dr. 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 On hold. -Discontinued ceftriaxone placed on ceftazadime aviBactam as per discussion with Dr. Johnnye Sima further discussion reveals this is just Carbapenem resistant and not carb append amaze resistant therefore switched to ciprofloxacin to complete 7 days total  History of alcoholism -CIWA as mentioned above -Daughter reported to me earlier in the admission that the patient was getting wine delivered from [employer] to her home-patient claims that she has not been drinking at all  Chronic bilateral LE edema On spironolactone as an outpatient usually takes this twice daily for  edema As an outpatient adjust the same  Constipation In setting of chronic opiate use Patient  paradoxically had loose stools on de-escalation of her opiates 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   Hypokalemia Was as low as 2.3-currently 4.2-discontinue K. Dur 40 twice daily 7/19 Check labs in 2 to 3 days post discharge    Procedures:  None  Consultations:  Telephone consulted infectious disease Dr. Johnnye Sima 7/18 with regards to antibiotic choices-he is followed in the outpatient setting  Discharge Exam: Vitals:   10/02/17 2029 10/03/17 0500  BP: (!) 159/97 (!) 157/101  Pulse: (!) 105 (!) 109  Resp: 14 14  Temp: 98 F (36.7 C)   SpO2: 99% 97%    General: Awake alert much more coherent oriented  Cardiovascular: s1 s2 no m/r/g  Respiratory: cta b no added sound abd soft nt nd  Discharge Instructions   Discharge Instructions    Diet - low sodium heart healthy   Complete by:  As directed    Increase activity slowly   Complete by:  As directed      Allergies as of 10/03/2017      Reactions   Sulfa Antibiotics Hives, Swelling, Other (See Comments)   Reaction:  Facial/eye swelling    Coreg [carvedilol] Other (See Comments)   Memory loss   Motrin [ibuprofen] Palpitations   Toprol Xl [metoprolol Tartrate] Cough   Doxycycline Hyclate Other (See Comments)   HEARTBURN   Flovent Hfa [fluticasone] Itching   Statins Other (See Comments)   MENTAL STATUS CHANGE      Medication List    STOP taking these medications   acetaminophen 500 MG tablet Commonly known as:  TYLENOL   alendronate 70 MG tablet Commonly known as:  FOSAMAX   cephALEXin 250 MG capsule Commonly known as:  KEFLEX   CLARITIN 10 MG tablet Generic drug:  loratadine   methocarbamol 500 MG tablet Commonly known as:  ROBAXIN   ondansetron 4 MG disintegrating tablet Commonly known as:  ZOFRAN-ODT   senna-docusate 8.6-50 MG  tablet Commonly known as:  SENOKOT S   SYSTANE OP   traMADol 50 MG tablet Commonly known as:  ULTRAM     TAKE these medications   acidophilus Caps capsule Take 2 capsules by mouth daily.   aspirin EC 81 MG tablet Take 81 mg by mouth at bedtime.   ciprofloxacin 500 MG tablet Commonly known as:  CIPRO Take 1 tablet (500 mg total) by mouth 2 (two) times daily.   estradiol 0.1 MG/GM vaginal cream Commonly known as:  ESTRACE Place 1 Applicatorful vaginally at bedtime as needed (for vaginal discomfort).   gabapentin 100 MG capsule Commonly known as:  NEURONTIN Take 2 capsules (200 mg total) by mouth 2 (two) times daily. What changed:    how much to take  additional instructions   hydrOXYzine 25 MG tablet Commonly known as:  ATARAX/VISTARIL Take 25 mg by mouth daily as needed for anxiety (and/or sleep).   loperamide 2 MG capsule Commonly known as:  IMODIUM Take 1 capsule (2 mg total) by mouth 2 (two) times daily as needed for diarrhea or loose stools.   oxyCODONE 5 MG immediate release tablet Commonly known as:  Oxy IR/ROXICODONE Take 1 tablet (5 mg total) by mouth every 6 (six) hours as needed for severe pain. What changed:  Another medication with the  same name was removed. Continue taking this medication, and follow the directions you see here.   spironolactone 50 MG tablet Commonly known as:  ALDACTONE Take 50 mg by mouth 2 (two) times daily.   traZODone 50 MG tablet Commonly known as:  DESYREL Take 1 tablet (50 mg total) by mouth daily as needed for sleep. What changed:  how much to take   Zinc Oxide 12.8 % ointment Commonly known as:  TRIPLE PASTE Apply topically as needed for irritation.      Allergies  Allergen Reactions  . Sulfa Antibiotics Hives, Swelling and Other (See Comments)    Reaction:  Facial/eye swelling   . Coreg [Carvedilol] Other (See Comments)    Memory loss  . Motrin [Ibuprofen] Palpitations  . Toprol Xl [Metoprolol Tartrate] Cough   . Doxycycline Hyclate Other (See Comments)    HEARTBURN  . Flovent Hfa [Fluticasone] Itching  . Statins Other (See Comments)    MENTAL STATUS CHANGE      The results of significant diagnostics from this hospitalization (including imaging, microbiology, ancillary and laboratory) are listed below for reference.    Significant Diagnostic Studies: Dg Chest 2 View  Result Date: 09/28/2017 CLINICAL DATA:  77 year old female with abdominal pain. EXAM: CHEST - 2 VIEW COMPARISON:  None. FINDINGS: Mild chronic bronchitic changes. No focal consolidation, pleural effusion, or pneumothorax. The cardiac silhouette is within normal limits. No acute osseous pathology. IMPRESSION: No active cardiopulmonary disease. Electronically Signed   By: Anner Crete M.D.   On: 09/28/2017 00:48   Ct Head Wo Contrast  Result Date: 09/28/2017 CLINICAL DATA:  77 year old female with confusion. EXAM: CT HEAD WITHOUT CONTRAST TECHNIQUE: Contiguous axial images were obtained from the base of the skull through the vertex without intravenous contrast. COMPARISON:  Head CT dated 06/14/2016 FINDINGS: Evaluation is limited due to motion artifact as well as streak artifact caused by coiling material Brain: There is mild age-related atrophy and chronic microvascular ischemic changes. There is no acute intracranial hemorrhage. No mass effect or midline shift. No extra-axial fluid collection. Vascular: No hyperdense vessel or unexpected calcification. Skull: Normal. Negative for fracture or focal lesion. Sinuses/Orbits: No acute finding. Other: None IMPRESSION: No acute intracranial hemorrhage. Age-related atrophy and chronic microvascular ischemic changes. Electronically Signed   By: Anner Crete M.D.   On: 09/28/2017 04:05   Mr Brain Wo Contrast  Result Date: 09/28/2017 CLINICAL DATA:  Initial evaluation for acute altered mental status. EXAM: MRI HEAD WITHOUT CONTRAST TECHNIQUE: Multiplanar, multiecho pulse sequences of the  brain and surrounding structures were obtained without intravenous contrast. COMPARISON:  Prior CT from earlier same day. FINDINGS: Brain: Examination is markedly limited as the patient was unable to tolerate the full length of the exam. Axial DWI a sequences only were performed. DWI imaging demonstrates no evidence for acute or subacute infarct. Gray-white matter differentiation grossly maintained. No appreciable mass lesion or mass effect. No midline shift. Ventricles normal size without hydrocephalus. No appreciable extra-axial fluid collection. Vascular: Not assessed on this limited exam. Skull and upper cervical spine: Poorly assessed on this limited exam. Sinuses/Orbits: Not assessed on this limited exam. Other: None. IMPRESSION: 1. Limited study with only axial DWI sequence performed. 2. No evidence for acute or subacute infarct. No other obvious intracranial abnormality. Electronically Signed   By: Jeannine Boga M.D.   On: 09/28/2017 21:00   Ct Abdomen Pelvis W Contrast  Result Date: 09/28/2017 CLINICAL DATA:  Constipation with rectal pain since yesterday. History of melanoma. EXAM: CT ABDOMEN  AND PELVIS WITH CONTRAST TECHNIQUE: Multidetector CT imaging of the abdomen and pelvis was performed using the standard protocol following bolus administration of intravenous contrast. CONTRAST:  120mL ISOVUE-300 IOPAMIDOL (ISOVUE-300) INJECTION 61% COMPARISON:  Gallbladder ultrasound 02/26/2016. FINDINGS: Lower chest: Mild cardiomegaly without pericardial effusion. Aortic atherosclerosis is identified. Bibasilar atelectasis. No effusion or pneumothorax. Hepatobiliary: Probable Phrygian cap of the gallbladder without gallstones or secondary signs of acute cholecystitis. Water attenuating lesions of the liver compatible cysts. No enhancing mass lesions or biliary dilatation. Pancreas: Atrophic pancreas without ductal dilatation or mass. Spleen: Normal Adrenals/Urinary Tract: Normal bilateral adrenal glands.  Symmetric cortical enhancement of both kidneys with symmetric pyelograms on repeat delayed imaging. No hydroureteronephrosis. The urinary bladder is unremarkable for the degree of distention. Stomach/Bowel: Large stool ball measuring 7.8 x 6.4 x 9.3 cm in the rectum with rectal wall thickening and perirectal inflammation consistent with stercoral colitis. Scattered colonic diverticulosis with increased colonic stool retention consistent with constipation is also identified. Decompressed stomach with normal small bowel rotation. A few fluid-filled mildly distended small bowel loops may be secondary to peristaltic change. Enteritis is not entirely excluded but believed less likely. No findings of acute appendicitis. Vascular/Lymphatic: Mild aortoiliac and branch vessel atherosclerosis without aneurysm. No adenopathy. Reproductive: Left-sided predominantly intramural but slightly submucosal 12 mm in diameter fibroid. No adnexal mass. Other: No free air nor free fluid. Musculoskeletal: Left femoral nail fixation without complicating features. Inferior endplate compression of L1, chronic. Thoracolumbar spondylosis with evidence of prior lumbar spinal fusion at L4-5. IMPRESSION: 1. Cardiomegaly without pericardial effusion. 2. Water attenuating cysts most of which are too small to characterize within the liver. 3. Rectal thickening with perirectal inflammation and large stool ball consistent with stercoral colitis. Increased fecal retention within the colon compatible with constipation. 4. 12 mm uterine fibroid. 5. Thoracolumbar spondylosis with L4-5 fusion. Chronic inferior endplate compression of L1. Electronically Signed   By: Ashley Royalty M.D.   On: 09/28/2017 01:56    Microbiology: Recent Results (from the past 240 hour(s))  Carbapenem Resistance Panel     Status: None   Collection Time: 09/26/17  1:15 AM  Result Value Ref Range Status   Carba Resistance IMP Gene NOT DETECTED NOT DETECTED Final   Carba  Resistance VIM Gene NOT DETECTED NOT DETECTED Final   Carba Resistance NDM Gene NOT DETECTED NOT DETECTED Final   Carba Resistance KPC Gene NOT DETECTED NOT DETECTED Final   Carba Resistance OXA48 Gene NOT DETECTED NOT DETECTED Final    Comment: (NOTE) Cepheid Carba-R is an FDA-cleared nucleic acid amplification test  (NAAT)for the detection and differentiation of genes encoding the  most prevalent carbapenemases in bacterial isolate samples. Carbapenemase gene identification and implementation of comprehensive  infection control measures are recommended by the CDC to prevent the  spread of the resistant organisms. Performed at [facility], [place] [address], [place], [place] [address]   Urine culture     Status: Abnormal   Collection Time: 09/28/17  1:16 AM  Result Value Ref Range Status   Specimen Description   Final    URINE, RANDOM Performed at [facility] [address], [place], [place] [address]    Special Requests   Final    NONE Performed at [facility], [place] [address] [place], [place] [address]    Culture (A)  Final    >=100,000 COLONIES/mL SERRATIA MARCESCENS Results Called to: B. KAUR RN, AT [phone] 10/01/17 BY D. Victoriano Lain INFECTION CONTROL NOTIFIED Performed  at [facility], [place] [address] [place], [place] [address]    Report Status 10/01/2017 FINAL  Final   Organism ID, Bacteria SERRATIA MARCESCENS (A)  Final      Susceptibility   Serratia marcescens - MIC*    CEFAZOLIN >=64 RESISTANT Resistant     CEFTRIAXONE RESISTANT Resistant     CIPROFLOXACIN <=0.25 SENSITIVE Sensitive     GENTAMICIN <=1 SENSITIVE Sensitive     NITROFURANTOIN 256 RESISTANT Resistant     TRIMETH/SULFA <=20 SENSITIVE Sensitive     ERTAPENEM Value in next row Resistant      RESISTANT>=8    * >=100,000 COLONIES/mL SERRATIA MARCESCENS     Labs: Basic Metabolic Panel: Recent Labs  Lab 09/28/17 0017 09/29/17 0748 10/01/17 0634  10/02/17 0640  NA 141 135 138 137  K 4.5 3.8 2.8* 4.2  CL 101 98 103 102  CO2 30 26 26 25   GLUCOSE 143* 125* 201* 107*  BUN 8 7* 12 8  CREATININE 0.76 0.76 0.63 0.67  CALCIUM 9.4 8.7* 8.4* 8.7*  MG  --   --  1.9  --    Liver Function Tests: Recent Labs  Lab 09/28/17 0017 09/29/17 0748  AST 22 22  ALT 13 14  ALKPHOS 95 97  BILITOT 0.8 0.9  PROT 7.0 7.0  ALBUMIN 3.5 3.4*   Recent Labs  Lab 09/28/17 0017  LIPASE 22   Recent Labs  Lab 09/28/17 0258  AMMONIA 13   CBC: Recent Labs  Lab 09/28/17 0017 09/28/17 0844 09/29/17 0748 10/01/17 0634 10/02/17 0640  WBC 11.9* 14.7* 16.8* 11.9* 11.4*  NEUTROABS 9.8*  --  13.3* 7.9* 5.1  HGB 15.9* 14.8 16.4* 15.6* 14.5  HCT 46.3* 44.7 49.1* 46.3* 43.4  MCV 86.7 86.5 87.7 86.2 85.8  PLT 200 196 193 229 252   Cardiac Enzymes: No results for input(s): CKTOTAL, CKMB, CKMBINDEX, TROPONINI in the last 168 hours. BNP: BNP (last 3 results) No results for input(s): BNP in the last 8760 hours.  ProBNP (last 3 results) No results for input(s): PROBNP in the last 8760 hours.  CBG: No results for input(s): GLUCAP in the last 168 hours.     Signed:  Nita Sells MD   [facility] 10/03/2017, 11:22 AM

## 2017-10-03 NOTE — Progress Notes (Signed)
Pt leaving at this time with PTAR headed to [facility].

## 2017-10-03 NOTE — NC FL2 (Addendum)
[place] LEVEL OF CARE SCREENING TOOL     IDENTIFICATION  Patient Name: Denise Kelly Birthdate: 10-Mar-1941 Sex: female Admission Date (Current Location): 09/27/2017  [facility] and [place] Number:  Herbalist and Address:  [facility],  [place] [address] [place]      Provider Number: [id number]  Attending Physician Name and Address:  Nita Sells, MD  Relative Name and Phone Number:       Current Level of Care: Hospital Recommended Level of Care: [place] Prior Approval Number:    Date Approved/Denied: 10/02/17 PASRR Number: [id number]   Discharge Plan: SNF    Current Diagnoses: Patient Active Problem List   Diagnosis Date Noted  . Acute encephalopathy 09/28/2017  . Constipation, slow transit 09/28/2017  . Unsteady gait 04/28/2017  . Spondylolisthesis 07/17/2016  . Bilateral lower extremity edema 07/17/2016  . Chronic pain syndrome   . Depression 05/23/2016  . Chronic back pain 02/25/2016  . Lesion of liver 02/25/2016  . History of fracture of left hip 11/28/2015  . Fall 11/28/2015  . RLS (restless legs syndrome) 11/28/2015  . Confusion 03/14/2014  . Recurrent UTI 03/14/2014  . Effusion of right knee 02/22/2014  . Alcohol dependence ([place]) 02/22/2014  . Substance induced mood disorder ([place]) 02/22/2014  . Hepatic encephalopathy ([place])   . Altered mental status 02/21/2014  . SIRS (systemic inflammatory response syndrome) ([place]) 02/21/2014  . S/P total knee arthroplasty 01/02/2014  . Essential hypertension, benign 11/10/2013  . LAFB (left anterior fascicular block) 11/10/2013  . Obesity, unspecified 11/10/2013    Orientation RESPIRATION BLADDER Height & Weight     Self, Time, Situation, Place  Normal External catheter, Continent Weight: 164 lb (74.4 kg) Height:  5\' 6"  (167.6 cm)  BEHAVIORAL SYMPTOMS/MOOD NEUROLOGICAL BOWEL NUTRITION STATUS      Continent Diet(See dc summary)  AMBULATORY  STATUS COMMUNICATION OF NEEDS Skin   Extensive Assist Verbally Normal                       Personal Care Assistance Level of Assistance  Bathing, Feeding, Dressing Bathing Assistance: Limited assistance Feeding assistance: Independent Dressing Assistance: Limited assistance     Functional Limitations Info  Sight, Hearing, Speech Sight Info: Impaired Hearing Info: Impaired Speech Info: Adequate    SPECIAL CARE FACTORS FREQUENCY  PT (By licensed PT), OT (By licensed OT)     PT Frequency: 5x/week OT Frequency: 5x/week            Contractures Contractures Info: Not present    Additional Factors Info  Code Status, Allergies Code Status Info: DNR Allergies Info: Sulfa Antibiotics, Coreg Carvedilol, Motrin Ibuprofen, Toprol Xl Metoprolol Tartrate, Doxycycline Hyclate, Flovent Hfa Fluticasone, Statins           Current Medications (10/03/2017):  This is the current hospital active medication list Current Facility-Administered Medications  Medication Dose Route Frequency Provider Last Rate Last Dose  . aspirin EC tablet 81 mg  81 mg Oral QHS Rise Patience, MD   81 mg at 10/02/17 2122  . chlordiazePOXIDE (LIBRIUM) capsule 5 mg  5 mg Oral BID PRN Nita Sells, MD      . ciprofloxacin (CIPRO) tablet 500 mg  500 mg Oral BID Campbell Riches, MD   500 mg at 10/03/17 0900  . gabapentin (NEURONTIN) capsule 200 mg  200 mg Oral BID Nita Sells, MD   200 mg at 10/03/17 0938  . loperamide (IMODIUM) capsule 2  mg  2 mg Oral BID PRN Nita Sells, MD   2 mg at 10/01/17 1708  . ondansetron (ZOFRAN) tablet 4 mg  4 mg Oral Q6H PRN Rise Patience, MD   4 mg at 10/03/17 5320   Or  . ondansetron (ZOFRAN) injection 4 mg  4 mg Intravenous Q6H PRN Rise Patience, MD      . oxyCODONE (Oxy IR/ROXICODONE) immediate release tablet 5 mg  5 mg Oral Q8H PRN Nita Sells, MD   5 mg at 10/03/17 0636  . spironolactone (ALDACTONE) tablet 50 mg  50 mg  Oral BID Rise Patience, MD   50 mg at 10/03/17 2334  . traZODone (DESYREL) tablet 50 mg  50 mg Oral Daily PRN Nita Sells, MD   50 mg at 10/02/17 2357  . Zinc Oxide (TRIPLE PASTE) 12.8 % ointment   Topical PRN Nita Sells, MD         Discharge Medications: Please see discharge summary for a list of discharge medications.  Relevant Imaging Results:  Relevant Lab Results:   Additional Information SSN: [id number]  Nila Nephew, LCSW

## 2017-10-03 NOTE — Progress Notes (Signed)
Pt leaving this afternoon with PTAR headed to [facility]. Pt and her daughter, Baldo Ash, aware of discharge. Discharge instructions/prescriptions sent to with PTAR for delivery at SNF.

## 2017-12-24 ENCOUNTER — Emergency Department (HOSPITAL_COMMUNITY)
Admission: EM | Admit: 2017-12-24 | Discharge: 2017-12-25 | Disposition: A | Discharge status: Home / Self Care | Payer: Medicare Other | Attending: Emergency Medicine | Admitting: Emergency Medicine

## 2017-12-24 ENCOUNTER — Encounter (HOSPITAL_COMMUNITY): Payer: Self-pay | Admitting: Emergency Medicine

## 2017-12-24 ENCOUNTER — Other Ambulatory Visit: Payer: Self-pay

## 2017-12-24 ENCOUNTER — Emergency Department (HOSPITAL_COMMUNITY): Payer: Medicare Other

## 2017-12-24 DIAGNOSIS — Z85828 Personal history of other malignant neoplasm of skin: Secondary | ICD-10-CM | POA: Diagnosis not present

## 2017-12-24 DIAGNOSIS — Z87891 Personal history of nicotine dependence: Secondary | ICD-10-CM | POA: Diagnosis not present

## 2017-12-24 DIAGNOSIS — R3915 Urgency of urination: Secondary | ICD-10-CM | POA: Insufficient documentation

## 2017-12-24 DIAGNOSIS — Z79899 Other long term (current) drug therapy: Secondary | ICD-10-CM | POA: Insufficient documentation

## 2017-12-24 DIAGNOSIS — R1030 Lower abdominal pain, unspecified: Secondary | ICD-10-CM | POA: Insufficient documentation

## 2017-12-24 DIAGNOSIS — Z7982 Long term (current) use of aspirin: Secondary | ICD-10-CM | POA: Insufficient documentation

## 2017-12-24 DIAGNOSIS — R000 Tachycardia, unspecified: Secondary | ICD-10-CM | POA: Insufficient documentation

## 2017-12-24 DIAGNOSIS — R300 Dysuria: Secondary | ICD-10-CM | POA: Diagnosis present

## 2017-12-24 LAB — URINALYSIS, ROUTINE W REFLEX MICROSCOPIC
Glucose, UA: NEGATIVE mg/dL
HGB URINE DIPSTICK: NEGATIVE
Ketones, ur: 5 mg/dL — AB
LEUKOCYTES UA: NEGATIVE
Nitrite: NEGATIVE
PH: 5 (ref 5.0–8.0)
Protein, ur: 30 mg/dL — AB
SPECIFIC GRAVITY, URINE: 1.023 (ref 1.005–1.030)

## 2017-12-24 LAB — CBC WITH DIFFERENTIAL/PLATELET
Abs Immature Granulocytes: 0.03 10*3/uL (ref 0.00–0.07)
BASOS ABS: 0 10*3/uL (ref 0.0–0.1)
Basophils Relative: 0 %
EOS PCT: 0 %
Eosinophils Absolute: 0 10*3/uL (ref 0.0–0.5)
HEMATOCRIT: 42.4 % (ref 36.0–46.0)
HEMOGLOBIN: 13 g/dL (ref 12.0–15.0)
Immature Granulocytes: 0 %
LYMPHS ABS: 1.7 10*3/uL (ref 0.7–4.0)
LYMPHS PCT: 19 %
MCH: 24.3 pg — ABNORMAL LOW (ref 26.0–34.0)
MCHC: 30.7 g/dL (ref 30.0–36.0)
MCV: 79.3 fL — ABNORMAL LOW (ref 80.0–100.0)
Monocytes Absolute: 0.7 10*3/uL (ref 0.1–1.0)
Monocytes Relative: 8 %
NRBC: 0 % (ref 0.0–0.2)
Neutro Abs: 6.5 10*3/uL (ref 1.7–7.7)
Neutrophils Relative %: 73 %
Platelets: 163 10*3/uL (ref 150–400)
RBC: 5.35 MIL/uL — ABNORMAL HIGH (ref 3.87–5.11)
RDW: 17.5 % — ABNORMAL HIGH (ref 11.5–15.5)
WBC: 9 10*3/uL (ref 4.0–10.5)

## 2017-12-24 LAB — COMPREHENSIVE METABOLIC PANEL
ALBUMIN: 2.9 g/dL — AB (ref 3.5–5.0)
ALK PHOS: 83 U/L (ref 38–126)
ALT: 12 U/L (ref 0–44)
ANION GAP: 11 (ref 5–15)
AST: 20 U/L (ref 15–41)
BILIRUBIN TOTAL: 1.3 mg/dL — AB (ref 0.3–1.2)
BUN: 10 mg/dL (ref 8–23)
CALCIUM: 8.3 mg/dL — AB (ref 8.9–10.3)
CO2: 30 mmol/L (ref 22–32)
CREATININE: 0.67 mg/dL (ref 0.44–1.00)
Chloride: 96 mmol/L — ABNORMAL LOW (ref 98–111)
GFR calc Af Amer: >60 mL/min (ref >60)
GFR calc non Af Amer: >60 mL/min (ref >60)
GLUCOSE: 132 mg/dL — AB (ref 70–99)
Potassium: 3.2 mmol/L — ABNORMAL LOW (ref 3.5–5.1)
Sodium: 137 mmol/L (ref 135–145)
TOTAL PROTEIN: 5.9 g/dL — AB (ref 6.5–8.1)

## 2017-12-24 LAB — I-STAT CG4 LACTIC ACID, ED: Lactic Acid, Venous: 1.02 mmol/L (ref 0.5–1.9)

## 2017-12-24 LAB — LIPASE, BLOOD: Lipase: 26 U/L (ref 11–51)

## 2017-12-24 MED ORDER — SODIUM CHLORIDE 0.9 % IJ SOLN
INTRAMUSCULAR | Status: AC
  Filled 2017-12-24: qty 50

## 2017-12-24 MED ORDER — SODIUM CHLORIDE 0.9 % IV BOLUS
1000.0000 mL | Freq: Once | INTRAVENOUS | Status: AC
Start: 2017-12-24 — End: 2017-12-24
  Administered 2017-12-24: 1000 mL via INTRAVENOUS

## 2017-12-24 MED ORDER — SPIRONOLACTONE 25 MG PO TABS
50.0000 mg | ORAL_TABLET | Freq: Two times a day (BID) | ORAL | Status: DC
  Administered 2017-12-25: 50 mg via ORAL
  Filled 2017-12-24 (×2): qty 2

## 2017-12-24 MED ORDER — IOPAMIDOL (ISOVUE-300) INJECTION 61%
INTRAVENOUS | Status: AC
Start: 2017-12-24 — End: 2017-12-25
  Filled 2017-12-24: qty 100

## 2017-12-24 MED ORDER — TRAZODONE HCL 50 MG PO TABS
50.0000 mg | ORAL_TABLET | Freq: Every day | ORAL | Status: DC | PRN
Start: 2017-12-24 — End: 2017-12-25
  Administered 2017-12-25: 50 mg via ORAL
  Filled 2017-12-24: qty 1

## 2017-12-24 MED ORDER — ONDANSETRON 4 MG PO TBDP: 4.0000 mg | ORAL_TABLET | Freq: Three times a day (TID) | ORAL | Status: DC | PRN

## 2017-12-24 MED ORDER — IOPAMIDOL (ISOVUE-300) INJECTION 61%
100.0000 mL | Freq: Once | INTRAVENOUS | Status: AC | PRN
  Administered 2017-12-24: 100 mL via INTRAVENOUS

## 2017-12-24 NOTE — Progress Notes (Signed)
TTS spoke with EDP Janeece Fitting, PA-C who requests the pt remain in the ED overnight for observation. TTS spoke with Lindon Romp, NP and advised of disposition plan requested by EDP. Pt will likely need SW consult. Per chart review, social work was previously working with the pt and her daughter who the pt states is her POA. Pt's nurse Margaretha Sheffield, RN also aware.  Lind Covert, MSW, LCSW Therapeutic Triage Specialist  [phone]

## 2017-12-24 NOTE — ED Notes (Signed)
In to ambulate as ordered, pt refusing. I requested that the patient at least stand on the side of the bed so we can assess strength. Pt is upset, she wants her phone and a brief. I have given her both, she is still refusing, because she is "very upset" I agreed to give the patient a couple of minutes to calm down, notified PA of the same. Will attempt again shortly.

## 2017-12-24 NOTE — ED Notes (Signed)
Pt is refusing to even stand up at the bedside at this time. PA notified of the same.

## 2017-12-24 NOTE — ED Notes (Signed)
Bed: FD74 Expected date:  Expected time:  Means of arrival:  Comments: TR3

## 2017-12-24 NOTE — Care Management (Signed)
ED CM was present during [place] phone call to the patient's daughter. [name] states the patient had home health in the past but does not want people of "a certain demographic" in her home. Reports the patient has fired previous home health providers or not cooperated with them when they have arrived. [name] states the patient has an appointment with her PCP on Monday or Tuesday. Patient's understands from [facility] the patient has been discharged and plans to return home. Venita Sheffield RN CCM
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
## 2017-12-24 NOTE — ED Notes (Signed)
Bed: WA29 Expected date:  Expected time:  Means of arrival:  Comments: 15

## 2017-12-24 NOTE — ED Notes (Signed)
I talked to the patients daughter, pts daughter says the patient is unable to get up and around her home herself and the patient is too weak for the daughter to assess. I have let the PA know the same, social work aware as well.

## 2017-12-24 NOTE — ED Notes (Signed)
Patient transported to CT 

## 2017-12-24 NOTE — ED Notes (Signed)
I have called and left message for patient daughter for call back to come pick the patient up for discharge.

## 2017-12-24 NOTE — ED Notes (Signed)
Pt resting, repositioned pt in bed and provided sandwich/drink. Pt is aware she will be holding for case mgmt consult in the morning. Pure wick in place, dark amber urine noted. Pt asking about her phone, only noted pt to have clothes and keys in belongings bag and robe as a blanket over her.

## 2017-12-24 NOTE — ED Notes (Signed)
Bed: WA15 Expected date:  Expected time:  Means of arrival:  Comments: ResB 

## 2017-12-24 NOTE — Discharge Instructions (Addendum)
Your urinalysis today was normal. You CT today was also normal. As of today there are no signs of infection.Please return to the ED if you experience any shortness of breath, chest pain or worsening symptoms.
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
## 2017-12-24 NOTE — BH Assessment (Addendum)
Assessment Note  Denise Kelly is an 77 y.o. female who presents to the ED voluntarily. Pt initially came to the ED due to a UTI. When TTS entered the room the pt asked "what do you want?" Pt denies SI, HI, and AVH. Per chart review, social work case management has been working with the pt and her daughter in order to establish that the pt is able to live alone. Pt states her daughter lives next door to her. Pt states she has a home care nurse that comes to her home 3-4 times a week and assists her with her ADLs. Pt denies any hx of self-harm or substance abuse. Pt makes appropriate eye contact during the assessment. Pt tells this Probation officer she wants to be d/c. EDP states she attempted to contact the pt's daughter and was unsuccessful. Pt was being uncooperative while in the ED, refusing to stand, refusing to cooperate with ED staff. EDP states she spoke with the pt who was "very upset". Per chart review, pt was being d/c and refused to comply with ED staff.   TTS spoke with EDP Janeece Fitting, PA-C who requests the pt remain in the ED overnight for observation. TTS spoke with Lindon Romp, NP and advised of disposition plan requested by EDP. Pt will likely need SW consult. Per chart review, social work was previously working with the pt and her daughter who the pt states is her POA. Pt's nurse Margaretha Sheffield, RN also aware.  Diagnosis: Mild neurocognitive disorder due to another medical condition  Past Medical History:  Past Medical History:  Diagnosis Date  . Alcohol abuse    ETOH and xanax  . Anxiety    Panic attacks   . Arthritis   . Cancer ([place])    skin- basal , squamous , melonoma- right hand  . Cellulitis    right leg  . Complication of anesthesia 2009   "felt drunk for a week after"- AVM- both times- felt drunk  . Depression   . Encephalopathy   . GERD (gastroesophageal reflux disease)   . HOH (hard of hearing)   . HOH (hard of hearing)   . Hypertension    not on mediacations  . Palpitations    . PONV (postoperative nausea and vomiting)   . Spondylolisthesis of lumbosacral region   . UTI (urinary tract infection)    frequently    Past Surgical History:  Procedure Laterality Date  . BREAST SURGERY Right    2 breast biopsies  . 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 COLONOSCOPY  2011   polyps  . EYE SURGERY Bilateral    cataracts  . INTRAMEDULLARY (IM) NAIL INTERTROCHANTERIC Left 11/29/2015   Procedure: LEFT HIP   NAIL;  Surgeon: Renette Butters, MD;  Location: [facility];  Service: Orthopedics;  Laterality: Left;  . JOINT REPLACEMENT Left 09/2009   knee  . LUMBAR FUSION  07/17/2016   Lumbar four-five Posterior lumbar interbody fusion (N/A)  . TONSILLECTOMY    . TOTAL KNEE ARTHROPLASTY Right 01/02/2014   Procedure: TOTAL KNEE ARTHROPLASTY;  Surgeon: [name], MD;  Location: [facility];  Service: Orthopedics;  Laterality: Right;  . TUBAL LIGATION      Family History:  Family History  Problem Relation Age of Onset  . Hypertension Other   . Cancer Mother   . Cancer Father     Social History:  reports that she quit smoking about 25 years ago. She has a 20.00 pack-year  smoking history. She has never used smokeless tobacco. She reports that she does not drink alcohol or use drugs.  Additional Social History:  Alcohol / Drug Use Pain Medications: See MAR Prescriptions: See MAR Over the Counter: See MAR History of alcohol / drug use?: No history of alcohol / drug abuse  CIWA: CIWA-Ar BP: 132/86 Pulse Rate: (!) 113 COWS:    Allergies:  Allergies  Allergen Reactions  . Sulfa Antibiotics Hives, Swelling and Other (See Comments)    Reaction:  Facial/eye swelling   . Coreg [Carvedilol] Other (See Comments)    Memory loss  . Motrin [Ibuprofen] Palpitations  . Toprol Xl [Metoprolol Tartrate] Cough  . Doxycycline Hyclate Other (See Comments)    HEARTBURN  . Flovent Hfa [Fluticasone] Itching  . Statins Other (See Comments)    MENTAL STATUS CHANGE    Home  Medications:  (Not in a hospital admission)  OB/GYN Status:  No LMP recorded. Patient is postmenopausal.  General Assessment Data Location of Assessment: [facility] TTS Assessment: In system Is this a Tele or Face-to-Face Assessment?: Face-to-Face Is this an Initial Assessment or a Re-assessment for this encounter?: Initial Assessment Patient Accompanied by:: (alone) Language Other than English: No What gender do you identify as?: Female Marital status: Widowed Pregnancy Status: No Living Arrangements: Alone Can pt return to current living arrangement?: Yes Admission Status: Voluntary Is patient capable of signing voluntary admission?: Yes Referral Source: Self/Family/Friend Insurance type: [facility]     Crisis Care Plan Living Arrangements: Alone Name of Psychiatrist: none Name of Therapist: none  Education Status Is patient currently in school?: No Is the patient employed, unemployed or receiving disability?: Receiving disability income  Risk to self with the past 6 months Suicidal Ideation: No Has patient been a risk to self within the past 6 months prior to admission? : No Suicidal Intent: No Has patient had any suicidal intent within the past 6 months prior to admission? : No Is patient at risk for suicide?: No Suicidal Plan?: No Has patient had any suicidal plan within the past 6 months prior to admission? : No Access to Means: No What has been your use of drugs/alcohol within the last 12 months?: denies use  Previous Attempts/Gestures: No Triggers for Past Attempts: None known Intentional Self Injurious Behavior: None Family Suicide History: No Recent stressful life event(s): Recent negative physical changes Persecutory voices/beliefs?: No Depression: No Substance abuse history and/or treatment for substance abuse?: No Suicide prevention information given to non-admitted patients: Not applicable  Risk to Others within the past 6 months Homicidal Ideation: No Does  patient have any lifetime risk of violence toward others beyond the six months prior to admission? : No Thoughts of Harm to Others: No Current Homicidal Intent: No Current Homicidal Plan: No Access to Homicidal Means: No History of harm to others?: No Assessment of Violence: None Noted Does patient have access to weapons?: No Does patient have a court date: No Is patient on probation?: No  Psychosis Hallucinations: None noted Delusions: None noted  Mental Status Report Appearance/Hygiene: In scrubs, Unremarkable Eye Contact: Good Motor Activity: Freedom of movement Speech: Logical/coherent Level of Consciousness: Alert Mood: Irritable Affect: Irritable Anxiety Level: None Thought Processes: Relevant, Coherent Judgement: Partial Orientation: Person, Time, Place, Situation, Appropriate for developmental age Obsessive Compulsive Thoughts/Behaviors: None  Cognitive Functioning Concentration: Normal Memory: Remote Intact, Recent Intact Is patient IDD: No Insight: Good Impulse Control: Good Appetite: Good Have you had any weight changes? : No Change Sleep: No Change Total  Hours of Sleep: 8 Vegetative Symptoms: None  ADLScreening [facility] Assessment Services) Patient's cognitive ability adequate to safely complete daily activities?: Yes Patient able to express need for assistance with ADLs?: Yes Independently performs ADLs?: Yes (appropriate for developmental age)  Prior Inpatient Therapy Prior Inpatient Therapy: No  Prior Outpatient Therapy Prior Outpatient Therapy: No Does patient have an ACCT team?: No Does patient have Intensive In-House Services?  : No Does patient have [facility] services? : No Does patient have P4CC services?: No  ADL Screening (condition at time of admission) Patient's cognitive ability adequate to safely complete daily activities?: Yes Is the patient deaf or have difficulty hearing?: Yes Does the patient have difficulty seeing, even when wearing  glasses/contacts?: No Does the patient have difficulty concentrating, remembering, or making decisions?: Yes Patient able to express need for assistance with ADLs?: Yes Does the patient have difficulty dressing or bathing?: No Independently performs ADLs?: Yes (appropriate for developmental age) Does the patient have difficulty walking or climbing stairs?: No Weakness of Legs: None Weakness of Arms/Hands: None  Home Assistive Devices/Equipment Home Assistive Devices/Equipment: None    Abuse/Neglect Assessment (Assessment to be complete while patient is alone) Abuse/Neglect Assessment Can Be Completed: Yes Physical Abuse: Denies Verbal Abuse: Denies Sexual Abuse: Denies Exploitation of patient/patient's resources: Denies Self-Neglect: Denies     Regulatory affairs officer (For Healthcare) Does Patient Have a Medical Advance Directive?: Yes Type of Advance Directive: Healthcare Power of Attorney(daughter "[place] in Chart?: No - copy requested Would patient like information on creating a medical advance directive?: No - Patient declined          Disposition: TTS spoke with EDP Janeece Fitting, PA-C who requests the pt remain in the ED overnight for observation. TTS spoke with Lindon Romp, NP and advised of disposition plan requested by EDP. Pt will likely need SW consult. Per chart review, social work was previously working with the pt and her daughter who the pt states is her POA. Pt's nurse Margaretha Sheffield, RN also aware.  Disposition Initial Assessment Completed for this Encounter: Yes Disposition of Patient: (psych cleared) Patient refused recommended treatment: No Patient referred to: Social Work  On Site Evaluation by:   Reviewed with Physician:    Lyanne Co 12/24/2017 11:26 PM

## 2017-12-24 NOTE — ED Triage Notes (Signed)
Patient arrived by EMS from home. Pt c/o bladder pain, UTI, and chronic pain from previous surgeries, surgeries were knee, hip, and back. BP 140 palpated, HR 112, RR 20, T 97.9. 20 gauge in lft ac.

## 2017-12-25 DIAGNOSIS — R000 Tachycardia, unspecified: Secondary | ICD-10-CM | POA: Diagnosis not present

## 2017-12-25 LAB — URINE CULTURE

## 2017-12-25 NOTE — Progress Notes (Addendum)
CSW received handoff from 2nd shift CSW. Per notes, patient is to be transitioned home. CSW aware patient was refusing to get up and walk prior to discharge yesterday. CSW aware PA requested patient be held overnight for observation. CSW to follow up with patient this morning regarding plan for transportation home. Per notes, patient's daughter is actively involved and checks on patient regularly.   9:00am- CSW spoke with patient's daughter, Baldo Ash [phone], regarding patient disposition. CSW explained that patient was discharged last night but refused to get up and walk. CSW explained that patient could be transported via [place], as long as patient's daughter would be home to meet PTAR and patient. CSW spoke with patient about why she was refusing to get up and walk the night prior. Per patient, she did not want to go home alone. CSW explained that daughter is agreeable to meet patient at her home. Patient agreeable to be transported by [facility]. CSW has updated patient's RN and EDP.  Ollen Barges, [place] Work Department  [employer]  [phone]

## 2018-01-04 ENCOUNTER — Emergency Department (HOSPITAL_COMMUNITY): Payer: Medicare Other

## 2018-01-04 ENCOUNTER — Inpatient Hospital Stay (HOSPITAL_COMMUNITY)
Admission: EM | Admit: 2018-01-04 | Discharge: 2018-01-07 | DRG: 175 | Disposition: A | Discharge status: Skilled Nursing Facility | Payer: Medicare Other | Attending: Family Medicine | Admitting: Family Medicine

## 2018-01-04 ENCOUNTER — Encounter (HOSPITAL_COMMUNITY): Payer: Self-pay | Admitting: Emergency Medicine

## 2018-01-04 ENCOUNTER — Other Ambulatory Visit: Payer: Self-pay

## 2018-01-04 DIAGNOSIS — Z87891 Personal history of nicotine dependence: Secondary | ICD-10-CM | POA: Diagnosis not present

## 2018-01-04 DIAGNOSIS — G8929 Other chronic pain: Secondary | ICD-10-CM | POA: Diagnosis not present

## 2018-01-04 DIAGNOSIS — I5030 Unspecified diastolic (congestive) heart failure: Secondary | ICD-10-CM | POA: Diagnosis not present

## 2018-01-04 DIAGNOSIS — Z981 Arthrodesis status: Secondary | ICD-10-CM | POA: Diagnosis not present

## 2018-01-04 DIAGNOSIS — K219 Gastro-esophageal reflux disease without esophagitis: Secondary | ICD-10-CM | POA: Diagnosis present

## 2018-01-04 DIAGNOSIS — F1994 Other psychoactive substance use, unspecified with psychoactive substance-induced mood disorder: Secondary | ICD-10-CM | POA: Diagnosis present

## 2018-01-04 DIAGNOSIS — Z9842 Cataract extraction status, left eye: Secondary | ICD-10-CM | POA: Diagnosis not present

## 2018-01-04 DIAGNOSIS — K7682 Hepatic encephalopathy: Secondary | ICD-10-CM | POA: Diagnosis present

## 2018-01-04 DIAGNOSIS — Z7982 Long term (current) use of aspirin: Secondary | ICD-10-CM | POA: Diagnosis not present

## 2018-01-04 DIAGNOSIS — Z96653 Presence of artificial knee joint, bilateral: Secondary | ICD-10-CM | POA: Diagnosis present

## 2018-01-04 DIAGNOSIS — H9190 Unspecified hearing loss, unspecified ear: Secondary | ICD-10-CM | POA: Diagnosis present

## 2018-01-04 DIAGNOSIS — F1020 Alcohol dependence, uncomplicated: Secondary | ICD-10-CM | POA: Diagnosis present

## 2018-01-04 DIAGNOSIS — R52 Pain, unspecified: Secondary | ICD-10-CM | POA: Diagnosis not present

## 2018-01-04 DIAGNOSIS — I10 Essential (primary) hypertension: Secondary | ICD-10-CM | POA: Diagnosis present

## 2018-01-04 DIAGNOSIS — R627 Adult failure to thrive: Secondary | ICD-10-CM | POA: Diagnosis present

## 2018-01-04 DIAGNOSIS — R410 Disorientation, unspecified: Secondary | ICD-10-CM | POA: Diagnosis present

## 2018-01-04 DIAGNOSIS — I444 Left anterior fascicular block: Secondary | ICD-10-CM | POA: Diagnosis present

## 2018-01-04 DIAGNOSIS — Z882 Allergy status to sulfonamides status: Secondary | ICD-10-CM

## 2018-01-04 DIAGNOSIS — I82402 Acute embolism and thrombosis of unspecified deep veins of left lower extremity: Secondary | ICD-10-CM | POA: Diagnosis not present

## 2018-01-04 DIAGNOSIS — M549 Dorsalgia, unspecified: Secondary | ICD-10-CM

## 2018-01-04 DIAGNOSIS — G9341 Metabolic encephalopathy: Secondary | ICD-10-CM | POA: Diagnosis present

## 2018-01-04 DIAGNOSIS — Z23 Encounter for immunization: Secondary | ICD-10-CM | POA: Diagnosis present

## 2018-01-04 DIAGNOSIS — Z809 Family history of malignant neoplasm, unspecified: Secondary | ICD-10-CM | POA: Diagnosis not present

## 2018-01-04 DIAGNOSIS — Z96659 Presence of unspecified artificial knee joint: Secondary | ICD-10-CM

## 2018-01-04 DIAGNOSIS — K7290 Hepatic failure, unspecified without coma: Secondary | ICD-10-CM | POA: Diagnosis present

## 2018-01-04 DIAGNOSIS — Z8249 Family history of ischemic heart disease and other diseases of the circulatory system: Secondary | ICD-10-CM

## 2018-01-04 DIAGNOSIS — Z886 Allergy status to analgesic agent status: Secondary | ICD-10-CM | POA: Diagnosis not present

## 2018-01-04 DIAGNOSIS — M545 Low back pain: Secondary | ICD-10-CM | POA: Diagnosis present

## 2018-01-04 DIAGNOSIS — Z888 Allergy status to other drugs, medicaments and biological substances status: Secondary | ICD-10-CM | POA: Diagnosis not present

## 2018-01-04 DIAGNOSIS — I82401 Acute embolism and thrombosis of unspecified deep veins of right lower extremity: Secondary | ICD-10-CM | POA: Diagnosis not present

## 2018-01-04 DIAGNOSIS — R531 Weakness: Secondary | ICD-10-CM | POA: Diagnosis present

## 2018-01-04 DIAGNOSIS — I82413 Acute embolism and thrombosis of femoral vein, bilateral: Secondary | ICD-10-CM | POA: Diagnosis present

## 2018-01-04 DIAGNOSIS — Z9841 Cataract extraction status, right eye: Secondary | ICD-10-CM | POA: Diagnosis not present

## 2018-01-04 DIAGNOSIS — E876 Hypokalemia: Secondary | ICD-10-CM | POA: Diagnosis present

## 2018-01-04 DIAGNOSIS — M4310 Spondylolisthesis, site unspecified: Secondary | ICD-10-CM

## 2018-01-04 DIAGNOSIS — F319 Bipolar disorder, unspecified: Secondary | ICD-10-CM | POA: Diagnosis present

## 2018-01-04 DIAGNOSIS — F419 Anxiety disorder, unspecified: Secondary | ICD-10-CM | POA: Diagnosis present

## 2018-01-04 DIAGNOSIS — Z6824 Body mass index (BMI) 24.0-24.9, adult: Secondary | ICD-10-CM

## 2018-01-04 DIAGNOSIS — Z96651 Presence of right artificial knee joint: Secondary | ICD-10-CM | POA: Diagnosis not present

## 2018-01-04 DIAGNOSIS — R2681 Unsteadiness on feet: Secondary | ICD-10-CM | POA: Diagnosis not present

## 2018-01-04 DIAGNOSIS — R0902 Hypoxemia: Secondary | ICD-10-CM | POA: Diagnosis present

## 2018-01-04 DIAGNOSIS — G4700 Insomnia, unspecified: Secondary | ICD-10-CM | POA: Diagnosis present

## 2018-01-04 DIAGNOSIS — I2609 Other pulmonary embolism with acute cor pulmonale: Principal | ICD-10-CM | POA: Diagnosis present

## 2018-01-04 DIAGNOSIS — M4316 Spondylolisthesis, lumbar region: Secondary | ICD-10-CM | POA: Diagnosis not present

## 2018-01-04 DIAGNOSIS — Z66 Do not resuscitate: Secondary | ICD-10-CM | POA: Diagnosis present

## 2018-01-04 DIAGNOSIS — I2699 Other pulmonary embolism without acute cor pulmonale: Secondary | ICD-10-CM | POA: Diagnosis present

## 2018-01-04 DIAGNOSIS — Z87440 Personal history of urinary (tract) infections: Secondary | ICD-10-CM

## 2018-01-04 LAB — COMPREHENSIVE METABOLIC PANEL
ALK PHOS: 112 U/L (ref 38–126)
ALT: 13 U/L (ref 0–44)
AST: 23 U/L (ref 15–41)
Albumin: 3 g/dL — ABNORMAL LOW (ref 3.5–5.0)
Anion gap: 16 — ABNORMAL HIGH (ref 5–15)
BILIRUBIN TOTAL: 0.9 mg/dL (ref 0.3–1.2)
BUN: <5 mg/dL — ABNORMAL LOW (ref 8–23)
CALCIUM: 8.8 mg/dL — AB (ref 8.9–10.3)
CO2: 28 mmol/L (ref 22–32)
CREATININE: 0.68 mg/dL (ref 0.44–1.00)
Chloride: 92 mmol/L — ABNORMAL LOW (ref 98–111)
Glucose, Bld: 108 mg/dL — ABNORMAL HIGH (ref 70–99)
Potassium: 2.8 mmol/L — ABNORMAL LOW (ref 3.5–5.1)
Sodium: 136 mmol/L (ref 135–145)
TOTAL PROTEIN: 6.1 g/dL — AB (ref 6.5–8.1)

## 2018-01-04 LAB — CBC WITH DIFFERENTIAL/PLATELET
Abs Immature Granulocytes: 0.1 10*3/uL — ABNORMAL HIGH (ref 0.00–0.07)
BASOS PCT: 1 %
Basophils Absolute: 0.1 10*3/uL (ref 0.0–0.1)
Eosinophils Absolute: 0 10*3/uL (ref 0.0–0.5)
Eosinophils Relative: 0 %
HEMATOCRIT: 47.8 % — AB (ref 36.0–46.0)
HEMOGLOBIN: 14.7 g/dL (ref 12.0–15.0)
Immature Granulocytes: 1 %
LYMPHS ABS: 1.7 10*3/uL (ref 0.7–4.0)
Lymphocytes Relative: 15 %
MCH: 24.7 pg — AB (ref 26.0–34.0)
MCHC: 30.8 g/dL (ref 30.0–36.0)
MCV: 80.2 fL (ref 80.0–100.0)
MONO ABS: 0.9 10*3/uL (ref 0.1–1.0)
MONOS PCT: 8 %
Neutro Abs: 8.7 10*3/uL — ABNORMAL HIGH (ref 1.7–7.7)
Neutrophils Relative %: 75 %
PLATELETS: 198 10*3/uL (ref 150–400)
RBC: 5.96 MIL/uL — ABNORMAL HIGH (ref 3.87–5.11)
RDW: 17.6 % — ABNORMAL HIGH (ref 11.5–15.5)
WBC: 11.5 10*3/uL — ABNORMAL HIGH (ref 4.0–10.5)
nRBC: 0 % (ref 0.0–0.2)

## 2018-01-04 LAB — I-STAT TROPONIN, ED: TROPONIN I, POC: 0.01 ng/mL (ref 0.00–0.08)

## 2018-01-04 LAB — D-DIMER, QUANTITATIVE: D-Dimer, Quant: 0.86 ug/mL-FEU — ABNORMAL HIGH (ref 0.00–0.50)

## 2018-01-04 MED ORDER — SPIRONOLACTONE 25 MG PO TABS
50.0000 mg | ORAL_TABLET | Freq: Two times a day (BID) | ORAL | Status: DC
  Administered 2018-01-04 – 2018-01-07 (×6): 50 mg via ORAL
  Filled 2018-01-04 (×6): qty 2

## 2018-01-04 MED ORDER — POTASSIUM CHLORIDE CRYS ER 20 MEQ PO TBCR
40.0000 meq | EXTENDED_RELEASE_TABLET | Freq: Once | ORAL | Status: AC
  Administered 2018-01-04: 40 meq via ORAL
  Filled 2018-01-04: qty 2

## 2018-01-04 MED ORDER — ASPIRIN EC 81 MG PO TBEC
81.0000 mg | DELAYED_RELEASE_TABLET | Freq: Every day | ORAL | Status: DC
  Administered 2018-01-04 – 2018-01-06 (×3): 81 mg via ORAL
  Filled 2018-01-04 (×3): qty 1

## 2018-01-04 MED ORDER — IOPAMIDOL (ISOVUE-370) INJECTION 76%
INTRAVENOUS | Status: AC
  Filled 2018-01-04: qty 100

## 2018-01-04 MED ORDER — SODIUM CHLORIDE 0.9 % IV SOLN
INTRAVENOUS | Status: DC
  Administered 2018-01-05 – 2018-01-06 (×2): via INTRAVENOUS

## 2018-01-04 MED ORDER — SORBITOL 70 % SOLN: 30.0000 mL | Freq: Every day | Status: DC | PRN

## 2018-01-04 MED ORDER — INFLUENZA VAC SPLIT HIGH-DOSE 0.5 ML IM SUSY
0.5000 mL | PREFILLED_SYRINGE | INTRAMUSCULAR | Status: AC
  Administered 2018-01-05: 0.5 mL via INTRAMUSCULAR
  Filled 2018-01-04: qty 0.5

## 2018-01-04 MED ORDER — GUAIFENESIN-DM 100-10 MG/5ML PO SYRP
5.0000 mL | ORAL_SOLUTION | ORAL | Status: DC | PRN
  Administered 2018-01-05 – 2018-01-07 (×5): 5 mL via ORAL
  Filled 2018-01-04 (×6): qty 5

## 2018-01-04 MED ORDER — ZINC OXIDE 12.8 % EX OINT
1.0000 "application " | TOPICAL_OINTMENT | Freq: Every day | CUTANEOUS | Status: DC | PRN
  Filled 2018-01-04: qty 56.7

## 2018-01-04 MED ORDER — LOPERAMIDE HCL 2 MG PO CAPS
2.0000 mg | ORAL_CAPSULE | Freq: Two times a day (BID) | ORAL | Status: DC | PRN
  Administered 2018-01-05: 2 mg via ORAL
  Filled 2018-01-04: qty 1

## 2018-01-04 MED ORDER — POTASSIUM CHLORIDE 10 MEQ/100ML IV SOLN
10.0000 meq | Freq: Once | INTRAVENOUS | Status: AC
  Administered 2018-01-04: 10 meq via INTRAVENOUS
  Filled 2018-01-04: qty 100

## 2018-01-04 MED ORDER — POTASSIUM CHLORIDE 10 MEQ/50ML IV SOLN: 10.0000 meq | INTRAVENOUS | Status: DC | PRN

## 2018-01-04 MED ORDER — HEPARIN BOLUS VIA INFUSION
4000.0000 [IU] | Freq: Once | INTRAVENOUS | Status: AC
  Administered 2018-01-04: 4000 [IU] via INTRAVENOUS
  Filled 2018-01-04: qty 4000

## 2018-01-04 MED ORDER — RISAQUAD PO CAPS
2.0000 | ORAL_CAPSULE | ORAL | Status: DC
  Administered 2018-01-06: 2 via ORAL
  Filled 2018-01-04: qty 2

## 2018-01-04 MED ORDER — HEPARIN (PORCINE) IN NACL 100-0.45 UNIT/ML-% IJ SOLN
850.0000 [IU]/h | INTRAMUSCULAR | Status: AC
  Administered 2018-01-04: 1100 [IU]/h via INTRAVENOUS
  Administered 2018-01-05: 800 [IU]/h via INTRAVENOUS
  Filled 2018-01-04 (×4): qty 250

## 2018-01-04 MED ORDER — POTASSIUM CHLORIDE 10 MEQ/100ML IV SOLN
10.0000 meq | INTRAVENOUS | Status: AC
  Administered 2018-01-04 – 2018-01-05 (×3): 10 meq via INTRAVENOUS
  Filled 2018-01-04 (×3): qty 100

## 2018-01-04 MED ORDER — SODIUM CHLORIDE 0.9 % IV SOLN
INTRAVENOUS | Status: DC
  Administered 2018-01-04: 21:00:00 via INTRAVENOUS

## 2018-01-04 MED ORDER — HYDROXYZINE HCL 25 MG PO TABS
25.0000 mg | ORAL_TABLET | Freq: Every day | ORAL | Status: DC | PRN
  Administered 2018-01-05: 25 mg via ORAL
  Filled 2018-01-04: qty 1

## 2018-01-04 MED ORDER — IOPAMIDOL (ISOVUE-370) INJECTION 76%
100.0000 mL | Freq: Once | INTRAVENOUS | Status: AC | PRN
  Administered 2018-01-04: 100 mL via INTRAVENOUS

## 2018-01-04 MED ORDER — TRAZODONE HCL 50 MG PO TABS
50.0000 mg | ORAL_TABLET | Freq: Every day | ORAL | Status: DC | PRN
  Administered 2018-01-04 – 2018-01-06 (×2): 50 mg via ORAL
  Filled 2018-01-04 (×2): qty 1

## 2018-01-04 MED ORDER — ONDANSETRON 4 MG PO TBDP: 4.0000 mg | ORAL_TABLET | Freq: Three times a day (TID) | ORAL | Status: DC | PRN

## 2018-01-04 NOTE — ED Triage Notes (Signed)
Pt BIB [facility] from home, family reports generalized weakness x "a while", pt was supposed to go to a doctor's appointment this morning but was unable to get in the car. Family requesting an FL2 and SNF placement. Pt A&O x 4, denies pain/shortness of breath, VSS.

## 2018-01-04 NOTE — ED Provider Notes (Signed)
[facility] EMERGENCY DEPARTMENT Provider Note   CSN: [id number] Arrival date & time: 01/04/18  1514     History   Chief Complaint Chief Complaint  Patient presents with  . Weakness    HPI Denise Kelly is a 77 y.o. female.  Patient is a 77 year old female with a history of alcohol abuse, depression, recent admission for UTI, sepsis and encephalopathy who is presenting by EMS today from home.  Patient has no complaints at this time but daughter states she had been feeling weak for a while and they are unable to care for her at home.  Patient was seen in the emergency room on 12/24/2017.  At that time she was ruled out for a UTI.  She was noted to be tachycardic but there was no source of this.  She was evaluated by TTS at the time and became very upset because did not feel that she needed evaluation.  She was demanding to go home but would not get up and walk.  However after staying in the emergency room overnight social work came to see her and she stated she just did not want to get up and walk and she was able to do so PTar took her home.  Currently patient is here alone.  She denies chest pain, shortness of breath, abdominal pain, nausea, vomiting.  She states she has not been eating because she does not feel like it.  She states she is taking her medications but cannot tell me what those are.  The history is provided by the patient, the EMS personnel and medical records.    Past Medical History:  Diagnosis Date  . Alcohol abuse    ETOH and xanax  . Anxiety    Panic attacks   . Arthritis   . Cancer ([place])    skin- basal , squamous , melonoma- right hand  . Cellulitis    right leg  . Complication of anesthesia 2009   "felt drunk for a week after"- AVM- both times- felt drunk  . Depression   . Encephalopathy   . GERD (gastroesophageal reflux disease)   . HOH (hard of hearing)   . HOH (hard of hearing)   . Hypertension    not on mediacations  .  Palpitations   . PONV (postoperative nausea and vomiting)   . Spondylolisthesis of lumbosacral region   . UTI (urinary tract infection)    frequently    Patient Active Problem List   Diagnosis Date Noted  . Acute encephalopathy 09/28/2017  . Constipation, slow transit 09/28/2017  . Unsteady gait 04/28/2017  . Spondylolisthesis 07/17/2016  . Bilateral lower extremity edema 07/17/2016  . Chronic pain syndrome   . Depression 05/23/2016  . Chronic back pain 02/25/2016  . Lesion of liver 02/25/2016  . History of fracture of left hip 11/28/2015  . Fall 11/28/2015  . RLS (restless legs syndrome) 11/28/2015  . Confusion 03/14/2014  . Recurrent UTI 03/14/2014  . Effusion of right knee 02/22/2014  . Alcohol dependence ([place]) 02/22/2014  . Substance induced mood disorder ([place]) 02/22/2014  . Hepatic encephalopathy ([place])   . Altered mental status 02/21/2014  . SIRS (systemic inflammatory response syndrome) ([place]) 02/21/2014  . S/P total knee arthroplasty 01/02/2014  . Essential hypertension, benign 11/10/2013  . LAFB (left anterior fascicular block) 11/10/2013  . Obesity, unspecified 11/10/2013    Past Surgical History:  Procedure Laterality Date  . BREAST SURGERY Right    2 breast biopsies  .  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 COLONOSCOPY  2011   polyps  . EYE SURGERY Bilateral    cataracts  . INTRAMEDULLARY (IM) NAIL INTERTROCHANTERIC Left 11/29/2015   Procedure: LEFT HIP   NAIL;  Surgeon: Renette Butters, MD;  Location: [facility];  Service: Orthopedics;  Laterality: Left;  . JOINT REPLACEMENT Left 09/2009   knee  . LUMBAR FUSION  07/17/2016   Lumbar four-five Posterior lumbar interbody fusion (N/A)  . TONSILLECTOMY    . TOTAL KNEE ARTHROPLASTY Right 01/02/2014   Procedure: TOTAL KNEE ARTHROPLASTY;  Surgeon: [name], MD;  Location: [facility];  Service: Orthopedics;  Laterality: Right;  . TUBAL LIGATION       OB History   None      Home Medications     Prior to Admission medications   Medication Sig Start Date End Date Taking? Authorizing Provider  acidophilus (RISAQUAD) CAPS capsule Take 2 capsules by mouth daily. Patient taking differently: Take 2 capsules by mouth 3 (three) times a week.  05/29/16  Yes Elgergawy, Silver Huguenin, MD  aspirin EC 81 MG tablet Take 81 mg by mouth at bedtime.    Yes [provider]  estradiol (ESTRACE) 0.1 MG/GM vaginal cream Place 1 Applicatorful vaginally at bedtime as needed (for vaginal discomfort).   Yes [provider]  hydrOXYzine (ATARAX/VISTARIL) 25 MG tablet Take 25 mg by mouth daily as needed for anxiety (and/or sleep).    Yes [provider]  loperamide (IMODIUM) 2 MG capsule Take 1 capsule (2 mg total) by mouth 2 (two) times daily as needed for diarrhea or loose stools. 10/03/17  Yes Nita Sells, MD  ondansetron (ZOFRAN-ODT) 4 MG disintegrating tablet Take 4 mg by mouth every 8 (eight) hours as needed. 12/21/17  Yes [provider]  spironolactone (ALDACTONE) 50 MG tablet Take 50 mg by mouth 2 (two) times daily.  09/16/17  Yes [provider]  traZODone (DESYREL) 50 MG tablet Take 1 tablet (50 mg total) by mouth daily as needed for sleep. 10/03/17  Yes Nita Sells, MD  Zinc Oxide (TRIPLE PASTE) 12.8 % ointment Apply topically as needed for irritation. Patient taking differently: Apply 1 application topically daily as needed for irritation.  10/03/17  Yes Nita Sells, MD  ciprofloxacin (CIPRO) 500 MG tablet Take 1 tablet (500 mg total) by mouth 2 (two) times daily. Patient not taking: Reported on 12/24/2017 10/03/17   Nita Sells, MD  gabapentin (NEURONTIN) 100 MG capsule Take 2 capsules (200 mg total) by mouth 2 (two) times daily. Patient not taking: Reported on 01/04/2018 10/03/17   Nita Sells, MD  oxyCODONE (OXY IR/ROXICODONE) 5 MG immediate release tablet Take 1 tablet (5 mg total) by mouth every 6 (six) hours as needed  for severe pain. Patient not taking: Reported on 12/24/2017 10/03/17   Nita Sells, MD    Family History Family History  Problem Relation Age of Onset  . Hypertension Other   . Cancer Mother   . Cancer Father     Social History Social History   Tobacco Use  . Smoking status: Former Smoker    Packs/day: 1.00    Years: 20.00    Pack years: 20.00    Last attempt to quit: 03/17/1992    Years since quitting: 25.8  . Smokeless tobacco: Never Used  . Tobacco comment: quit smoking many years ago .  quit 1994  Substance Use Topics  . Alcohol use: No    Comment:  unable to get alcohol per family not since September  . Drug use: No     Allergies   Sulfa antibiotics; Coreg [carvedilol]; Motrin [ibuprofen]; Toprol xl [metoprolol tartrate]; Doxycycline hyclate; Flovent hfa [fluticasone]; and Statins   Review of Systems Review of Systems  All other systems reviewed and are negative.    Physical Exam Updated Vital Signs BP (!) 147/94 (BP Location: Left Arm)   Pulse (!) 101   Temp 98.7 F (37.1 C) (Oral)   SpO2 94%   Physical Exam  Constitutional: She appears well-developed and well-nourished. No distress.  HENT:  Head: Normocephalic and atraumatic.  Mouth/Throat: Oropharynx is clear and moist.  Dry mucous membranes  Eyes: Pupils are equal, round, and reactive to light. Conjunctivae and EOM are normal.  Neck: Normal range of motion. Neck supple.  Cardiovascular: Normal rate, regular rhythm and intact distal pulses.  No murmur heard. Pulmonary/Chest: Effort normal and breath sounds normal. No respiratory distress. She has no wheezes. She has no rales.  Abdominal: Soft. She exhibits no distension. There is no tenderness. There is no rebound and no guarding.  Musculoskeletal: Normal range of motion. She exhibits tenderness. She exhibits no edema.  Patient seems to have tenderness with palpating the lower extremities below the knee however there is no erythema, minimal  swelling.  Pulses are intact in both lower extremities.  Neurological: She is alert.  Oriented to person and place  Skin: Skin is warm and dry. No rash noted. No erythema.  Psychiatric:  Calm and cooperative  Nursing note and vitals reviewed.    ED Treatments / Results  Labs (all labs ordered are listed, but only abnormal results are displayed) Labs Reviewed  CBC WITH DIFFERENTIAL/PLATELET - Abnormal; Notable for the following components:      Result Value   WBC 11.5 (*)    RBC 5.96 (*)    HCT 47.8 (*)    MCH 24.7 (*)    RDW 17.6 (*)    Neutro Abs 8.7 (*)    Abs Immature Granulocytes 0.10 (*)    All other components within normal limits  COMPREHENSIVE METABOLIC PANEL - Abnormal; Notable for the following components:   Potassium 2.8 (*)    Chloride 92 (*)    Glucose, Bld 108 (*)    BUN <5 (*)    Calcium 8.8 (*)    Total Protein 6.1 (*)    Albumin 3.0 (*)    Anion gap 16 (*)    All other components within normal limits  D-DIMER, QUANTITATIVE (NOT AT [facility]) - Abnormal; Notable for the following components:   D-Dimer, Quant 0.86 (*)    All other components within normal limits  URINALYSIS, ROUTINE W REFLEX MICROSCOPIC  I-STAT TROPONIN, ED    EKG EKG Interpretation  Date/Time:  Monday January 04 2018 17:43:41 EDT Ventricular Rate:  104 PR Interval:    QRS Duration: 100 QT Interval:  387 QTC Calculation: 510 R Axis:   -79 Text Interpretation:  Sinus tachycardia Ventricular premature complex Aberrant conduction of SV complex(es) Incomplete RBBB and LAFB Probable anterior infarct, age indeterminate Prolonged QT interval No significant change since last tracing Confirmed by Blanchie Dessert [phone]) on 01/04/2018 5:48:18 PM   Radiology Dg Chest 2 View  Result Date: 01/04/2018 CLINICAL DATA:  Cough EXAM: CHEST - 2 VIEW COMPARISON:  Chest x-ray dated 09/28/2017. FINDINGS: New subtle rounded opacity within the RIGHT mid lung region, only appreciable on the AP view. LEFT  lung is clear. No pleural effusion or  pneumothorax seen. Heart size and mediastinal contours are stable. IMPRESSION: New subtle rounded opacity within the RIGHT mid lung region, suspicious for early developing pneumonia. Neoplastic process is not excluded. Consider chest CT for further characterization. At minimum, recommend follow-up chest x-ray to ensure resolution. Electronically Signed   By: Franki Cabot M.D.   On: 01/04/2018 17:56   Ct Angio Chest Pe W And/or Wo Contrast  Result Date: 01/04/2018 CLINICAL DATA:  77 y/o F; generalized weakness. PE suspected, intermediate prob, positive D-dimer. EXAM: CT ANGIOGRAPHY CHEST WITH CONTRAST TECHNIQUE: Multidetector CT imaging of the chest was performed using the standard protocol during bolus administration of intravenous contrast. Multiplanar CT image reconstructions and MIPs were obtained to evaluate the vascular anatomy. CONTRAST:  60 cc Isovue 370 COMPARISON:  02/25/2016 CT angiogram of the chest. FINDINGS: Cardiovascular: Satisfactory opacification of the pulmonary arteries to the segmental level. Bilateral pulmonary emboli in the right main and bilateral segmental pulmonary arteries. No saddle embolus. RV/LV = 1.1. Stable mild cardiomegaly. No pericardial effusion. Mild coronary artery calcific atherosclerosis. Normal caliber main pulmonary artery and aorta. Mild aortic atherosclerosis. Mediastinum/Nodes: No enlarged mediastinal, hilar, or axillary lymph nodes. Thyroid gland, trachea, and esophagus demonstrate no significant findings. Lungs/Pleura: Centrally lucent wedge-shaped consolidation within the right lower lobe likely representing a pulmonary infarct. No additional consolidation, effusion, or pneumothorax. Upper Abdomen: Multiple small liver hypodensities likely representing cysts, a few are too small to characterize, stable from prior study. Musculoskeletal: No acute fracture. Mild-to-moderate discogenic degenerative changes of the spine. Review of  the MIP images confirms the above findings. IMPRESSION: 1. Positive for acute PE with CTevidence of right heart strain (RV/LV Ratio = 1.1) consistent with at least submassive (intermediate risk) PE. The presence of right heart strain has been associated with an increased risk of morbidity and mortality. Please activate Code PE by paging [phone]. 2. Right lower lobe wedge-shaped consolidation likely representing a pulmonary infarct. 3. Mild coronary and aortic calcific atherosclerosis. Critical Value/emergent results were called by telephone at the time of interpretation on 01/04/2018 at 7:41 pm to Dr. Blanchie Dessert , who verbally acknowledged these results. Electronically Signed   By: Kristine Garbe M.D.   On: 01/04/2018 19:42    Procedures Procedures (including critical care time)  Medications Ordered in ED Medications  0.9 %  sodium chloride infusion (has no administration in time range)     Initial Impression / Assessment and Plan / ED Course  I have reviewed the triage vital signs and the nursing notes.  Pertinent labs & imaging results that were available during my care of the patient were reviewed by me and considered in my medical decision making (see chart for details).     Elderly female with multiple medical problems as described above returning today by EMS for weakness for a while per family.  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  Patient lives in her own home and her daughter lives next door.  Patient currently states she is just not been eating much but has no other complaints at this time.  She is mildly tachycardic here with heart rates in the low 100s and oxygen saturation between 88 and 94%.  Breath sounds are clear bilaterally.  She has no abdominal pain but also does complain of dysuria for months.  Patient has had prior UTIs with sepsis.  Will check a CBC, CMP, d-dimer, troponin, EKG, chest x-ray.  Patient  given IV fluids as there is  no evidence of fluid overload at this time but mild dehydration.  D-dimer did come back elevated at 0.86.  We will do a CT to rule out PE.  7:54 PM BMP with hypokalemia of 2.8 it was replaced orally and by IV.  CMP is otherwise unchanged.  Troponin within normal limits.  EKG unchanged with sinus tachycardia.  Chest x-ray without acute findings.  Because patient's d-dimer was elevated a CT was done which showed submassive PE bilaterally with right heart strain.  Patient was started on heparin drip and will admit for further care  CRITICAL CARE Performed by: Jetta Murray Total critical care time: 30 minutes Critical care time was exclusive of separately billable procedures and treating other patients. Critical care was necessary to treat or prevent imminent or life-threatening deterioration. Critical care was time spent personally by me on the following activities: development of treatment plan with patient and/or surrogate as well as nursing, discussions with consultants, evaluation of patient's response to treatment, examination of patient, obtaining history from patient or surrogate, ordering and performing treatments and interventions, ordering and review of laboratory studies, ordering and review of radiographic studies, pulse oximetry and re-evaluation of patient's condition.  Final Clinical Impressions(s) / ED Diagnoses   Final diagnoses:  Acute pulmonary embolism with acute cor pulmonale, unspecified pulmonary embolism type [facility])  Hypokalemia    ED Discharge Orders    None       Blanchie Dessert, MD 01/04/18 1958

## 2018-01-04 NOTE — Progress Notes (Addendum)
CSW spoke with pt and pt's daughter, Denise Kelly, at bedside.  Pt stated she wants to go home. Pt stated that she would not like to go somewhere long term. CSW discussed requirements for Medicare to pay for SNF. CSW explained to pt and pt's family members that for Medicare to pay for SNF, pt would have to have a 3 night qualifying admission stay in the hospital. Pt continues to undergo medical workup.   CSW spoke with social work Optician, dispensing, at this time pt does not qualify for the 3 night stay waiver program. CSW discussed applying for long term Medicaid with family member. CSW provided information about the Medicaid application process and information on what they would need to bring to DSS. CSW provided pt's daughter with form to allow here to be the point of contact with DSS. Family agreeable to applying for Medicaid and looking for long term placement. Pt's daughter agreeable to call DSS to ask to speak with a placement specialist to see what additional information they can provide. CSW provided list of nursing home and adult care homes to pt's daughter via email. CSW left list with pt to be attached to pt's discharge paperwork if cleared for discharge.   Pt stated that she uses a walker to get around at home. Pt's daughter checks in on pt. Pt's daughter was attempting to take her to a doctor appointment today, but pt was to weak to get to the car. Pt's doctor told pt's daughter to bring her into the emergency room for placement.   Pt is still undergoing medical work up.  Wendelyn Breslow, [facility]  [phone]

## 2018-01-04 NOTE — H&P (Addendum)
HPI  Denise Kelly:[id number] DOB: 02-Oct-1940 DOA: 01/04/2018  PCP: [name], MD   Chief Complaint: multiple  HPI:  88 fem chronic bil lower extremity pain foll Dr. Lesleigh Noe and other work-up ned 10/2015] Prior L TKR and L THR previously on suppressive therapy with Keflex followed by Dr. Johnnye Sima in the past Chronic lymphedema in the past Grade 1 spondylolisthesis L4-5 with severe spinal stenosis L4-5 status post decompressive laminectomy 07/2011 Dr. Saintclair Halsted alcohol abuse.  Hypertension Bipolar  brought to emergency room by her daughter When I examined her she is somewhat unwilling to talk to me and keeps looking at her phone texting her daughter She seems to have outbursts of anger towards me at times when I talk to her and says "I do not know why I am here" I just told that I have a diagnosis of blood clots" "am I going to die and how soon"  I contacted her daughter to get further collateral --was acting "off and weak"--lost a significant amount of weight, cannot get from RR to bathroom even using her walker--has had a CNA and mother was asking her the same questions repetitively seems to have gotten "crazier" per daughter--the patient said to daughter" I cannot live like this anymore" They tried to see PCP x 2 and couldn't be seen--she allegedly came to the ED on 10/10 "because patient was angry with daughter" per daughter---has been going to RR in depends--hasnt been taking tramadol and oxycontin for about 1 mo  Apparently from circumstantial history patient was discharged by this examiner 09/2017 with a UTI and at that admission ID escalated her polypharmacy--on review 12/24/2017 in the emergency room she was observed and was somewhat noncooperative with the ED staff and working diagnosis at the time was failed outpatient treatment of UTI she had been on Cipro from PCP for 4 weeks-her urinalysis that time showed no nitrites or leukocytes and retrospective review of urine culture  from 12/24/2017 showed multiple species present  She returned with "weakness" and apparently had fired all of her home health workers and family seeking placement-there is a report of her not eating much but no other real complaints In ED the saturations were 88 to 90% basic labs were performed as below   ED Course: CT chest was done showing + CT with right heart strain RV/LV ratio 1.1 submassive, right lower lobe wedge-shaped consolidation?  Infarct Dimer 0.86 And glucose 108 Potassium 2.8 BUN/creatinine normal anion gap slightly elevated at 16 total protein 6.1 WBC 11.5 rest of hemogram normal  Review of Systems:   Negative for fever, visual changes, sore throat, rash, new muscle aches, chest pain, SOB, dysuria, bleeding, n/v/abdominal pain.  Past Medical History:  Diagnosis Date  . Alcohol abuse    ETOH and xanax  . Anxiety    Panic attacks   . Arthritis   . Cancer ([place])    skin- basal , squamous , melonoma- right hand  . Cellulitis    right leg  . Complication of anesthesia 2009   "felt drunk for a week after"- AVM- both times- felt drunk  . Depression   . Encephalopathy   . GERD (gastroesophageal reflux disease)   . HOH (hard of hearing)   . HOH (hard of hearing)   . Hypertension    not on mediacations  . Palpitations   . PONV (postoperative nausea and vomiting)   . Spondylolisthesis of lumbosacral region   . UTI (urinary tract infection)    frequently  Past Surgical History:  Procedure Laterality Date  . BREAST SURGERY Right    2 breast biopsies  . 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 COLONOSCOPY  2011   polyps  . EYE SURGERY Bilateral    cataracts  . INTRAMEDULLARY (IM) NAIL INTERTROCHANTERIC Left 11/29/2015   Procedure: LEFT HIP   NAIL;  Surgeon: Renette Butters, MD;  Location: [place];  Service: Orthopedics;  Laterality: Left;  . JOINT REPLACEMENT Left 09/2009   knee  . LUMBAR FUSION  07/17/2016   Lumbar four-five Posterior lumbar  interbody fusion (N/A)  . TONSILLECTOMY    . TOTAL KNEE ARTHROPLASTY Right 01/02/2014   Procedure: TOTAL KNEE ARTHROPLASTY;  Surgeon: [name], MD;  Location: [place];  Service: Orthopedics;  Laterality: Right;  . TUBAL LIGATION       reports that she quit smoking about 25 years ago. She has a 20.00 pack-year smoking history. She has never used smokeless tobacco. She reports that she does not drink alcohol or use drugs. Mobility: Gets around with a walker  Allergies  Allergen Reactions  . Sulfa Antibiotics Hives, Swelling and Other (See Comments)    Reaction:  Facial/eye swelling   . Coreg [Carvedilol] Other (See Comments)    Memory loss  . Motrin [Ibuprofen] Palpitations  . Toprol Xl [Metoprolol Tartrate] Cough  . Doxycycline Hyclate Other (See Comments)    HEARTBURN  . Flovent Hfa [Fluticasone] Itching  . Statins Other (See Comments)    MENTAL STATUS CHANGE    Family History  Problem Relation Age of Onset  . Hypertension Other   . Cancer Mother   . Cancer Father      Prior to Admission medications   Medication Sig Start Date End Date Taking? Authorizing Provider  acidophilus (RISAQUAD) CAPS capsule Take 2 capsules by mouth daily. Patient taking differently: Take 2 capsules by mouth 3 (three) times a week.  05/29/16  Yes Elgergawy, Silver Huguenin, MD  aspirin EC 81 MG tablet Take 81 mg by mouth at bedtime.    Yes [provider]  estradiol (ESTRACE) 0.1 MG/GM vaginal cream Place 1 Applicatorful vaginally at bedtime as needed (for vaginal discomfort).   Yes [provider]  hydrOXYzine (ATARAX/VISTARIL) 25 MG tablet Take 25 mg by mouth daily as needed for anxiety (and/or sleep).    Yes [provider]  loperamide (IMODIUM) 2 MG capsule Take 1 capsule (2 mg total) by mouth 2 (two) times daily as needed for diarrhea or loose stools. 10/03/17  Yes Nita Sells, MD  ondansetron (ZOFRAN-ODT) 4 MG disintegrating tablet Take 4 mg by mouth every 8 (eight)  hours as needed. 12/21/17  Yes [provider]  spironolactone (ALDACTONE) 50 MG tablet Take 50 mg by mouth 2 (two) times daily.  09/16/17  Yes [provider]  traZODone (DESYREL) 50 MG tablet Take 1 tablet (50 mg total) by mouth daily as needed for sleep. 10/03/17  Yes Nita Sells, MD  Zinc Oxide (TRIPLE PASTE) 12.8 % ointment Apply topically as needed for irritation. Patient taking differently: Apply 1 application topically daily as needed for irritation.  10/03/17  Yes Nita Sells, MD  ciprofloxacin (CIPRO) 500 MG tablet Take 1 tablet (500 mg total) by mouth 2 (two) times daily. Patient not taking: Reported on 12/24/2017 10/03/17   Nita Sells, MD  gabapentin (NEURONTIN) 100 MG capsule Take 2 capsules (200 mg total) by mouth 2 (two) times daily. Patient not taking: Reported on 01/04/2018 10/03/17  Nita Sells, MD  oxyCODONE (OXY IR/ROXICODONE) 5 MG immediate release tablet Take 1 tablet (5 mg total) by mouth every 6 (six) hours as needed for severe pain. Patient not taking: Reported on 12/24/2017 10/03/17   Nita Sells, MD    Physical Exam:  Vitals:   01/04/18 1553  BP: (!) 147/94  Pulse: (!) 101  Temp: 98.7 F (37.1 C)  SpO2: 94%     Awake somewhat uncooperative and keeps looking at her cell phone and texting someone despite my request to talk to her however does allow cursory exam extraocular movements intact no pallor no icterus  Mallampati 2  S1-S2 tachycardic  Chest is relatively clear  Abdomen is obese nontender no rebound  She has chronic foot drop-like appearance of both feet and she has pain "all over her feet" I am not able to examine or appreciate any palpable cord at the back of her calf or thigh  Neurologically she seems intact although full exam was deferred given patient's resistance to perform exam  Psych she seems irritable and somewhat non-cooperative  I have personally reviewed following labs and  imaging studies  Labs:   As above  Imaging studies:   As above  Medical tests:   EKG independently reviewed: Sinus tachycardia 100 range, PR interval 0.08 QRS axis -80, new RSR prime across precordium whereas prior EKG 09/19/2016 does not show this  Test discussed with performing physician:  S  Decision to obtain old records:   Yes  Review and summation of old records:   Yes  Principal Problem:   Acute pulmonary embolus (HCC) Active Problems:   LAFB (left anterior fascicular block)   S/P total knee arthroplasty   Alcohol dependence (HCC)   Substance induced mood disorder (HCC)   Hepatic encephalopathy (HCC)   Confusion   Chronic back pain   Spondylolisthesis   Unsteady gait   Assessment/Plan Acute submassive PE-keep on IV heparin-will need overlap as per [facility] guidelines-family understands risk of long-term anticoagulation given her poor mobility--he has not any recent falls from standing but slides out of the bed will seek PT OT eval-expect will need skilled care placement  Hypokalemia given 1 dose of 10 mEq IV potassium and potassium chloride 40 mEq orally-we will give 3 more runs of the same and recheck along with magnesium in the morning-we will repeat 3 more runs of potassium  Bipolar and prior metabolic encephalopathy when I last saw her-there is a history of alcoholism in her--her daughter states that this time she is not actively drinking alcohol-low threshold for starting CIWA  Chronic low back pain-continue home regimen it appears she is not on OxyIR gabapentin at this time--- fired from pain management Dr. Maryjean Ka because of no-shows and has not been on chronic pain meds for the past month  Insomnia-hold hydroxyzine daily as needed sleep, continue trazodone 50 at bedtime  Chronic urinary infections-no antibiotics at this time as recent urinalysis was negative and she does not have any overt lab data or symptoms to suggest infection  Overall frailty and  failure to thrive-patient is weaker than prior-she is lost weight according to daughter-at this admission it may be reasonable to introduce the concept of palliative care  Severity of Illness: The appropriate patient status for this patient is INPATIENT. Inpatient status is judged to be reasonable and necessary in order to provide the required intensity of service to ensure the patient's safety. The patient's presenting symptoms, physical exam findings, and initial radiographic and laboratory data in  the context of their chronic comorbidities is felt to place them at high risk for further clinical deterioration. Furthermore, it is not anticipated that the patient will be medically stable for discharge from the hospital within 2 midnights of admission. The following factors support the patient status of inpatient.   " The patient's presenting symptoms include shortness of breath and overall frailty. " The worrisome physical exam findings include tachycardia. " The initial radiographic and laboratory data are worrisome because of acute pulmonary embolisms and RSR prime on EKG. " The chronic co-morbidities include multiple.   * I certify that at the point of admission it is my clinical judgment that the patient will require inpatient hospital care spanning beyond 2 midnights from the point of admission due to high intensity of service, high risk for further deterioration and high frequency of surveillance required.*     DVT prophylaxis: Heparin Code Status: DNR confirmed with the daughter on the phone Family Communication: Long discussion with daughter on phone Consults called: None  Time spent: 38 minutes  Verlon Au, MD  [facility] Direct contact: [phone] --[place]ia [place]  --[email or web address]; password [email or web address]  7PM-7AM contact night coverage as above  01/04/2018, 8:35 PM

## 2018-01-04 NOTE — Progress Notes (Signed)
ANTICOAGULATION CONSULT NOTE - Initial Consult  Pharmacy Consult for heparin Indication: pulmonary embolus  Allergies  Allergen Reactions  . Sulfa Antibiotics Hives, Swelling and Other (See Comments)    Reaction:  Facial/eye swelling   . Coreg [Carvedilol] Other (See Comments)    Memory loss  . Motrin [Ibuprofen] Palpitations  . Toprol Xl [Metoprolol Tartrate] Cough  . Doxycycline Hyclate Other (See Comments)    HEARTBURN  . Flovent Hfa [Fluticasone] Itching  . Statins Other (See Comments)    MENTAL STATUS CHANGE    Patient Measurements: Weight: 150 lb (68 kg) Heparin Dosing Weight: 68 kg  Vital Signs: Temp: 98.7 F (37.1 C) (10/21 1553) Temp Source: Oral (10/21 1553) BP: 147/94 (10/21 1553) Pulse Rate: 101 (10/21 1553)  Labs: Recent Labs    01/04/18 1632  HGB 14.7  HCT 47.8*  PLT 198  CREATININE 0.68    Estimated Creatinine Clearance: 56 mL/min (by C-G formula based on SCr of 0.68 mg/dL).   Medical History: Past Medical History:  Diagnosis Date  . Alcohol abuse    ETOH and xanax  . Anxiety    Panic attacks   . Arthritis   . Cancer ([place])    skin- basal , squamous , melonoma- right hand  . Cellulitis    right leg  . Complication of anesthesia 2009   "felt drunk for a week after"- AVM- both times- felt drunk  . Depression   . Encephalopathy   . GERD (gastroesophageal reflux disease)   . HOH (hard of hearing)   . HOH (hard of hearing)   . Hypertension    not on mediacations  . Palpitations   . PONV (postoperative nausea and vomiting)   . Spondylolisthesis of lumbosacral region   . UTI (urinary tract infection)    frequently    Medications:  Scheduled:  . heparin  4,000 Units Intravenous Once  . iopamidol      . potassium chloride  40 mEq Oral Once   Infusions:  . sodium chloride    . heparin    . potassium chloride      Assessment: Pt is a 83 YOF presenting with generalized weakness. PMH is significant for HTN, alcohol abuse,  depression, recent admission for UTI, sepsis and encephalopathy on 12/24/17. CT reveals acute PE with evidence of right heart strain consistent with at least submassive PE. No anticoagulation PTA. Pharmacy has been consulted for heparin for acute PE. Hgb - 14.7, Plt - 198  Goal of Therapy:  Heparin level 0.3-0.7 units/ml Monitor platelets by anticoagulation protocol: Yes   Plan:  Heparin 4000 unit bolus x1, then heparin gtt 1100 units/hr. 8-hour HL Daily HL and CBC  Ruthene Methvin J Teeghan Hammer 01/04/2018,8:15 PM

## 2018-01-04 NOTE — ED Notes (Signed)
Patient transported to X-ray 

## 2018-01-05 ENCOUNTER — Other Ambulatory Visit (HOSPITAL_COMMUNITY): Payer: Self-pay

## 2018-01-05 ENCOUNTER — Inpatient Hospital Stay (HOSPITAL_COMMUNITY): Payer: Medicare Other

## 2018-01-05 DIAGNOSIS — M545 Low back pain: Secondary | ICD-10-CM

## 2018-01-05 DIAGNOSIS — I5030 Unspecified diastolic (congestive) heart failure: Secondary | ICD-10-CM

## 2018-01-05 DIAGNOSIS — G8929 Other chronic pain: Secondary | ICD-10-CM

## 2018-01-05 DIAGNOSIS — R2681 Unsteadiness on feet: Secondary | ICD-10-CM

## 2018-01-05 DIAGNOSIS — Z96651 Presence of right artificial knee joint: Secondary | ICD-10-CM

## 2018-01-05 DIAGNOSIS — M4316 Spondylolisthesis, lumbar region: Secondary | ICD-10-CM

## 2018-01-05 DIAGNOSIS — I444 Left anterior fascicular block: Secondary | ICD-10-CM

## 2018-01-05 LAB — COMPREHENSIVE METABOLIC PANEL
ALBUMIN: 2.5 g/dL — AB (ref 3.5–5.0)
ALT: 11 U/L (ref 0–44)
AST: 17 U/L (ref 15–41)
Alkaline Phosphatase: 89 U/L (ref 38–126)
Anion gap: 7 (ref 5–15)
CHLORIDE: 101 mmol/L (ref 98–111)
CO2: 30 mmol/L (ref 22–32)
CREATININE: 0.73 mg/dL (ref 0.44–1.00)
Calcium: 8.3 mg/dL — ABNORMAL LOW (ref 8.9–10.3)
GFR calc Af Amer: >60 mL/min (ref >60)
GFR calc non Af Amer: >60 mL/min (ref >60)
Glucose, Bld: 100 mg/dL — ABNORMAL HIGH (ref 70–99)
Potassium: 3.3 mmol/L — ABNORMAL LOW (ref 3.5–5.1)
Sodium: 138 mmol/L (ref 135–145)
Total Bilirubin: 0.9 mg/dL (ref 0.3–1.2)
Total Protein: 5.3 g/dL — ABNORMAL LOW (ref 6.5–8.1)

## 2018-01-05 LAB — CBC
HEMATOCRIT: 42.1 % (ref 36.0–46.0)
Hemoglobin: 12.9 g/dL (ref 12.0–15.0)
MCH: 24.2 pg — ABNORMAL LOW (ref 26.0–34.0)
MCHC: 30.6 g/dL (ref 30.0–36.0)
MCV: 79 fL — AB (ref 80.0–100.0)
Platelets: 192 10*3/uL (ref 150–400)
RBC: 5.33 MIL/uL — ABNORMAL HIGH (ref 3.87–5.11)
RDW: 17.1 % — AB (ref 11.5–15.5)
WBC: 9.2 10*3/uL (ref 4.0–10.5)
nRBC: 0 % (ref 0.0–0.2)

## 2018-01-05 LAB — URINALYSIS, ROUTINE W REFLEX MICROSCOPIC
BACTERIA UA: NONE SEEN
BILIRUBIN URINE: NEGATIVE
Glucose, UA: NEGATIVE mg/dL
HGB URINE DIPSTICK: NEGATIVE
KETONES UR: NEGATIVE mg/dL
LEUKOCYTES UA: NEGATIVE
NITRITE: POSITIVE — AB
PROTEIN: NEGATIVE mg/dL
SPECIFIC GRAVITY, URINE: 1.043 — AB (ref 1.005–1.030)
pH: 6 (ref 5.0–8.0)

## 2018-01-05 LAB — MAGNESIUM: Magnesium: 1.5 mg/dL — ABNORMAL LOW (ref 1.7–2.4)

## 2018-01-05 LAB — HEPARIN LEVEL (UNFRACTIONATED)
HEPARIN UNFRACTIONATED: 0.98 [IU]/mL — AB (ref 0.30–0.70)
Heparin Unfractionated: 0.89 IU/mL — ABNORMAL HIGH (ref 0.30–0.70)

## 2018-01-05 MED ORDER — MAGNESIUM SULFATE 2 GM/50ML IV SOLN
2.0000 g | Freq: Once | INTRAVENOUS | Status: AC
  Administered 2018-01-05: 2 g via INTRAVENOUS
  Filled 2018-01-05: qty 50

## 2018-01-05 MED ORDER — DIAZEPAM 2 MG PO TABS
2.0000 mg | ORAL_TABLET | Freq: Three times a day (TID) | ORAL | Status: DC | PRN
  Administered 2018-01-05 – 2018-01-07 (×6): 2 mg via ORAL
  Filled 2018-01-05 (×6): qty 1

## 2018-01-05 MED ORDER — DIPHENHYDRAMINE HCL 25 MG PO CAPS
25.0000 mg | ORAL_CAPSULE | ORAL | Status: DC | PRN
  Administered 2018-01-06 (×2): 25 mg via ORAL
  Filled 2018-01-05 (×2): qty 1

## 2018-01-05 MED ORDER — SODIUM CHLORIDE 0.9 % IV SOLN
INTRAVENOUS | Status: DC | PRN
  Administered 2018-01-05: 250 mL via INTRAVENOUS

## 2018-01-05 MED ORDER — POTASSIUM CHLORIDE CRYS ER 20 MEQ PO TBCR
40.0000 meq | EXTENDED_RELEASE_TABLET | Freq: Once | ORAL | Status: AC
  Administered 2018-01-05: 40 meq via ORAL
  Filled 2018-01-05: qty 2

## 2018-01-05 MED ORDER — LORATADINE 10 MG PO TABS
10.0000 mg | ORAL_TABLET | Freq: Every day | ORAL | Status: DC | PRN
  Administered 2018-01-05 – 2018-01-07 (×2): 10 mg via ORAL
  Filled 2018-01-05 (×3): qty 1

## 2018-01-05 MED ORDER — ACETAMINOPHEN 325 MG PO TABS
650.0000 mg | ORAL_TABLET | Freq: Four times a day (QID) | ORAL | Status: DC | PRN
  Administered 2018-01-05: 650 mg via ORAL
  Filled 2018-01-05: qty 2

## 2018-01-05 NOTE — Progress Notes (Signed)
  Echocardiogram 2D Echocardiogram has been performed.  Denise Kelly 01/05/2018, 6:02 PM

## 2018-01-05 NOTE — Progress Notes (Signed)
OT Cancellation Note  Patient Details Name: Denise Kelly MRN: [id number] DOB: Aug 12, 1940   Cancelled Treatment:    Reason Eval/Treat Not Completed: Medical issues which prohibited therapy.  Pt with new PE, started on Heparin last night, but has not yet been on Heparin for 24 hours.  Will reattempt.  [name], OTR/L Acute Rehabilitation Services Pager [phone] Office [phone]   [name] M 01/05/2018, 9:54 AM

## 2018-01-05 NOTE — Progress Notes (Signed)
PROGRESS NOTE  Denise Kelly  DQQ:[id number] DOB: May 04, 1940 DOA: 01/04/2018 PCP: [name], MD   Brief Narrative: Denise Kelly is a 77 y.o. female with a history of bipolar disorder, alcohol abuse, recent admission for sepsis, AMS due to UTI who presented from home on the insistence of her daughter for increasing weakness. She had not been acting like herself and fired all home healthcare assistance. Daughter feels she cannot be cared for at home. She was noted to be hypoxic on arrival, though the patient denied any concerns including shortness of breath, but has been walking much less. Evaluation ultimately revealed segmental bilateral pulmonary emboli with evidence of right heart strain and likely evolving RLL pulmonary infarct. Also with hypokalemia (2.8), normal troponin. Heparin infusion was started and patient admitted to telemetry 10/21.   Assessment & Plan: Principal Problem:   Acute pulmonary embolus (HCC) Active Problems:   LAFB (left anterior fascicular block)   S/P total knee arthroplasty   Alcohol dependence ([place])   Substance induced mood disorder (HCC)   Hepatic encephalopathy (HCC)   Confusion   Chronic back pain   Spondylolisthesis   Unsteady gait  Acute bilateral submassive PE with right heart strain: Possibly provoked by increasingly sedentary lifestyle. - Continue heparin gtt 24-48hrs and transition to po if stable.  - Supplemental oxygen as needed, 91% on room air at rest currently  Deconditioning: Also reported failure to thrive.  - PT/OT evaluations ordered. They will not evaluate patient until heparin has been going 24 hours. Anticipate need for placement.  - Consider palliative care evaluation, pt is DNR. Do not believe this is currently required as an inpatient. - Dietitian consulted  Hypokalemia:  - Improving with replacement, will continue replacement and recheck with magnesium in AM  Bipolar disorder, depressed mood, insomnia: - Trazodone qHS -  Avoid anticholinergics as able - Appears anxious, will trial low dose diazepam, monitor closely for sedation.  History of alcohol abuse: None recently as stated by her daughter.  - No evidence of withdrawal, low threshold to start CIWA.  - Multivitamins  History of UTI: Not characteristic urinalysis findings (no WBCs or bacteriuria).  - Monitor off abx.  Spondylolisthesis with spinal stenosis s/p fusion: No longer requiring chronic pain medications.  - PT/OT   HTN:  - Continue spironolactone home med.  DVT prophylaxis: Heparin gtt Code Status: DNR Family Communication: Daughter by phone this afternoon. Disposition Plan: Anticipate SNF. Has been to [facility] and wouldn't mind going there.  Consultants:   Pharmacy  Procedures:   None  Antimicrobials:  None   Subjective: Pt with some cough witnessed but she denies any cough. No trouble breathing or chest pain. Denies bleeding. Feels anxious about being in the hospital. Would want to go to [facility] if SNF recommended.  Objective: Vitals:   01/04/18 2145 01/04/18 2244 01/05/18 0359 01/05/18 1323  BP: (!) 127/96 (!) 157/87 (!) 156/94 (!) 141/88  Pulse: (!) 104 (!) 102 (!) 107 (!) 106  Resp: (!) 23 20 20    Temp:  97.7 F (36.5 C) (!) 97.3 F (36.3 C) (!) 97.3 F (36.3 C)  TempSrc:  Oral Oral Oral  SpO2: 96% 97% 98% 91%  Weight:  70.4 kg 70.4 kg   Height:  5\' 7"  (1.702 m)      Intake/Output Summary (Last 24 hours) at 01/05/2018 1401 Last data filed at 01/05/2018 1347 Gross per 24 hour  Intake 1069.45 ml  Output -  Net 1069.45 ml   [employer]  01/04/18 2000 01/04/18 2244 01/05/18 0359  Weight: 68 kg 70.4 kg 70.4 kg    Gen: 77 y.o. female in no distress Pulm: Tachypneic, some increased effort when maneuvering in bed for exam. Scant crackles that clear with deeper breaths. CV: Regular tachycardia. No murmur, rub, or gallop. No JVD, trace tender bilateral pedal edema. GI: Abdomen soft, non-tender, non-distended,  with normoactive bowel sounds. No organomegaly or masses felt. Ext: Warm, no deformities Skin: No rashes, lesions or ulcers Neuro: Alert and partially oriented. No focal neurological deficits. Psych: Judgement and insight appear fair.    Data Reviewed: I have personally reviewed following labs and imaging studies  CBC: Recent Labs  Lab 01/04/18 1632 01/05/18 0502  WBC 11.5* 9.2  NEUTROABS 8.7*  --   HGB 14.7 12.9  HCT 47.8* 42.1  MCV 80.2 79.0*  PLT 198 332   Basic Metabolic Panel: Recent Labs  Lab 01/04/18 1632 01/05/18 0502  NA 136 138  K 2.8* 3.3*  CL 92* 101  CO2 28 30  GLUCOSE 108* 100*  BUN <5* <5*  CREATININE 0.68 0.73  CALCIUM 8.8* 8.3*  MG  --  1.5*   GFR: Estimated Creatinine Clearance: 58.2 mL/min (by C-G formula based on SCr of 0.73 mg/dL). Liver Function Tests: Recent Labs  Lab 01/04/18 1632 01/05/18 0502  AST 23 17  ALT 13 11  ALKPHOS 112 89  BILITOT 0.9 0.9  PROT 6.1* 5.3*  ALBUMIN 3.0* 2.5*   No results for input(s): LIPASE, AMYLASE in the last 168 hours. No results for input(s): AMMONIA in the last 168 hours. Coagulation Profile: No results for input(s): INR, PROTIME in the last 168 hours. Cardiac Enzymes: No results for input(s): CKTOTAL, CKMB, CKMBINDEX, TROPONINI in the last 168 hours. BNP (last 3 results) No results for input(s): PROBNP in the last 8760 hours. HbA1C: No results for input(s): HGBA1C in the last 72 hours. CBG: No results for input(s): GLUCAP in the last 168 hours. Lipid Profile: No results for input(s): CHOL, HDL, LDLCALC, TRIG, CHOLHDL, LDLDIRECT in the last 72 hours. Thyroid Function Tests: No results for input(s): TSH, T4TOTAL, FREET4, T3FREE, THYROIDAB in the last 72 hours. Anemia Panel: No results for input(s): VITAMINB12, FOLATE, FERRITIN, TIBC, IRON, RETICCTPCT in the last 72 hours. Urine analysis:    Component Value Date/Time   COLORURINE AMBER (A) 01/05/2018 0258   APPEARANCEUR CLEAR 01/05/2018 0258    APPEARANCEUR Clear 09/19/2016 1633   LABSPEC 1.043 (H) 01/05/2018 0258   PHURINE 6.0 01/05/2018 0258   GLUCOSEU NEGATIVE 01/05/2018 0258   HGBUR NEGATIVE 01/05/2018 0258   BILIRUBINUR NEGATIVE 01/05/2018 0258   BILIRUBINUR Negative 09/19/2016 1633   KETONESUR NEGATIVE 01/05/2018 0258   PROTEINUR NEGATIVE 01/05/2018 0258   UROBILINOGEN 1.0 11/30/2014 2105   NITRITE POSITIVE (A) 01/05/2018 0258   LEUKOCYTESUR NEGATIVE 01/05/2018 0258   LEUKOCYTESUR 1+ (A) 09/19/2016 1633   No results found for this or any previous visit (from the past 240 hour(s)).    Radiology Studies: Dg Chest 2 View  Result Date: 01/04/2018 CLINICAL DATA:  Cough EXAM: CHEST - 2 VIEW COMPARISON:  Chest x-ray dated 09/28/2017. FINDINGS: New subtle rounded opacity within the RIGHT mid lung region, only appreciable on the AP view. LEFT lung is clear. No pleural effusion or pneumothorax seen. Heart size and mediastinal contours are stable. IMPRESSION: New subtle rounded opacity within the RIGHT mid lung region, suspicious for early developing pneumonia. Neoplastic process is not excluded. Consider chest CT for further characterization. At minimum, recommend follow-up  chest x-ray to ensure resolution. Electronically Signed   By: Franki Cabot M.D.   On: 01/04/2018 17:56   Ct Angio Chest Pe W And/or Wo Contrast  Result Date: 01/04/2018 CLINICAL DATA:  77 y/o F; generalized weakness. PE suspected, intermediate prob, positive D-dimer. EXAM: CT ANGIOGRAPHY CHEST WITH CONTRAST TECHNIQUE: Multidetector CT imaging of the chest was performed using the standard protocol during bolus administration of intravenous contrast. Multiplanar CT image reconstructions and MIPs were obtained to evaluate the vascular anatomy. CONTRAST:  60 cc Isovue 370 COMPARISON:  02/25/2016 CT angiogram of the chest. FINDINGS: Cardiovascular: Satisfactory opacification of the pulmonary arteries to the segmental level. Bilateral pulmonary emboli in the right main  and bilateral segmental pulmonary arteries. No saddle embolus. RV/LV = 1.1. Stable mild cardiomegaly. No pericardial effusion. Mild coronary artery calcific atherosclerosis. Normal caliber main pulmonary artery and aorta. Mild aortic atherosclerosis. Mediastinum/Nodes: No enlarged mediastinal, hilar, or axillary lymph nodes. Thyroid gland, trachea, and esophagus demonstrate no significant findings. Lungs/Pleura: Centrally lucent wedge-shaped consolidation within the right lower lobe likely representing a pulmonary infarct. No additional consolidation, effusion, or pneumothorax. Upper Abdomen: Multiple small liver hypodensities likely representing cysts, a few are too small to characterize, stable from prior study. Musculoskeletal: No acute fracture. Mild-to-moderate discogenic degenerative changes of the spine. Review of the MIP images confirms the above findings. IMPRESSION: 1. Positive for acute PE with CTevidence of right heart strain (RV/LV Ratio = 1.1) consistent with at least submassive (intermediate risk) PE. The presence of right heart strain has been associated with an increased risk of morbidity and mortality. Please activate Code PE by paging [phone]. 2. Right lower lobe wedge-shaped consolidation likely representing a pulmonary infarct. 3. Mild coronary and aortic calcific atherosclerosis. Critical Value/emergent results were called by telephone at the time of interpretation on 01/04/2018 at 7:41 pm to Dr. Blanchie Dessert , who verbally acknowledged these results. Electronically Signed   By: Kristine Garbe M.D.   On: 01/04/2018 19:42    Scheduled Meds: . Derrill Memo ON 01/06/2018] acidophilus  2 capsule Oral Once per day on Mon Wed Fri  . aspirin EC  81 mg Oral QHS  . spironolactone  50 mg Oral BID   Continuous Infusions: . sodium chloride 75 mL/hr at 01/05/18 1328  . sodium chloride 250 mL (01/05/18 1053)  . heparin 950 Units/hr (01/05/18 0609)     LOS: 1 day   Time spent: 25  minutes.  Patrecia Pour, MD [facility] [email or web address] Password [email or web address] 01/05/2018, 2:01 PM

## 2018-01-05 NOTE — Progress Notes (Signed)
ANTICOAGULATION CONSULT NOTE - Follow Up Consult  Pharmacy Consult for heparin Indication: pulmonary embolus  Allergies  Allergen Reactions  . Sulfa Antibiotics Hives, Swelling and Other (See Comments)    Reaction:  Facial/eye swelling   . Coreg [Carvedilol] Other (See Comments)    Memory loss  . Motrin [Ibuprofen] Palpitations  . Toprol Xl [Metoprolol Tartrate] Cough  . Doxycycline Hyclate Other (See Comments)    HEARTBURN  . Flovent Hfa [Fluticasone] Itching  . Statins Other (See Comments)    MENTAL STATUS CHANGE    Patient Measurements: Height: 5\' 7"  (170.2 cm) Weight: 155 lb 3.3 oz (70.4 kg) IBW/kg (Calculated) : 61.6 Heparin Dosing Weight: 68 kg  Vital Signs: Temp: 97.3 F (36.3 C) (10/22 1323) Temp Source: Oral (10/22 1323) BP: 141/88 (10/22 1323) Pulse Rate: 106 (10/22 1323)  Labs: Recent Labs    01/04/18 1632 01/05/18 0502 01/05/18 1433  HGB 14.7 12.9  --   HCT 47.8* 42.1  --   PLT 198 192  --   HEPARINUNFRC  --  0.98* 0.89*  CREATININE 0.68 0.73  --     Estimated Creatinine Clearance: 58.2 mL/min (by C-G formula based on SCr of 0.73 mg/dL).   Medical History: Past Medical History:  Diagnosis Date  . Alcohol abuse    ETOH and xanax  . Anxiety    Panic attacks   . Arthritis   . Cancer ([place])    skin- basal , squamous , melonoma- right hand  . Cellulitis    right leg  . Complication of anesthesia 2009   "felt drunk for a week after"- AVM- both times- felt drunk  . Depression   . Encephalopathy   . GERD (gastroesophageal reflux disease)   . HOH (hard of hearing)   . HOH (hard of hearing)   . Hypertension    not on mediacations  . Palpitations   . PONV (postoperative nausea and vomiting)   . Spondylolisthesis of lumbosacral region   . UTI (urinary tract infection)    frequently    Medications:  Scheduled:  . [START ON 01/06/2018] acidophilus  2 capsule Oral Once per day on Mon Wed Fri  . aspirin EC  81 mg Oral QHS  . spironolactone   50 mg Oral BID   Infusions:  . sodium chloride 75 mL/hr at 01/05/18 1517  . sodium chloride Stopped (01/05/18 1318)  . heparin 950 Units/hr (01/05/18 1517)    Assessment: Pt is a 56 YOF presenting with generalized weakness. PMH is significant for HTN, alcohol abuse, depression, recent admission for UTI, sepsis and encephalopathy on 12/24/17. CT revealed acute PE with evidence of right heart strain consistent with at least submassive PE.   Patient is currently on IV heparin at 950 units/hr. Repeat HL remains supratherapeutic but trending down. Per RN, there is some oozing around IV site but no overt s/s of bleeding.   Goal of Therapy:  Heparin level 0.3-0.7 units/ml Monitor platelets by anticoagulation protocol: Yes   Plan:  Decrease heparin infusion to 800 units/hr 8-hour HL  Daily HL and CBC F/u transition to oral anticoagulation   Albertina Parr, PharmD., BCPS Clinical Pharmacist Clinical phone for 01/05/18 until 3:30pm: [phone] If after 3:30pm, please refer to [facility] for unit-specific pharmacist

## 2018-01-05 NOTE — Progress Notes (Addendum)
Patient arrived to [facility] from ED. Patient alert and oriented with moments of confusion. Admission assessment complete. CCMD notified. Patient updated on plan of care. Will continue to monitor patient.

## 2018-01-05 NOTE — Progress Notes (Signed)
Initial Nutrition Assessment  DOCUMENTATION CODES:   Not applicable  INTERVENTION:   -Boost Breeze po TID, each supplement provides 250 kcal and 9 grams of protein -MVI with minerals daily -Magic Cup TID with meals, each supplement provides 290 kcals and 9 grams protein -Liberalized diet to regular; received verbal permission from Dr. Florene Glen (hospitalist) via [place]:   Predicted suboptimal nutrient intake related to social / environmental circumstances(ETOH abuse, history of depression) as evidenced by per patient/family report, meal completion < 50%, mild fat depletion, moderate fat depletion, mild muscle depletion, moderate muscle depletion.  GOAL:   Patient will meet greater than or equal to 90% of their needs  MONITOR:   PO intake, Supplement acceptance, Labs, Weight trends, Skin, I & O's  REASON FOR ASSESSMENT:   Malnutrition Screening Tool, Consult Assessment of nutrition requirement/status, Poor PO  ASSESSMENT:   Patient is a 77 year old female with a history of alcohol abuse, depression, recent admission for UTI, sepsis and encephalopathy who is presenting by EMS today from home.  Patient has no complaints at this time but daughter states she had been feeling weak for a while and they are unable to care for her at home  Pt admitted with acute submissive PE.   Spoke with pt, who refused to provide history or let RD perform nutrition-focused physical exam. When RD asked questions, pt was either short or replied "What are you doing and why do you have to do this?" despite multiple explanations by this RD regarding role and rationale for questions and exam. Assisted pt in opening graham cracker packets and provided pt with an additional warm blanket per her request.   Pt reports poor appetite and states "the food here is ghastly". She refused to provide diet history. Noted meal completion 15-75%. Nurse tech provided pt with multiple packs of graham  crackers, saltines, and peanut butter.   Pt endorses weight loss, however, when probed further about UBW or when weight loss occurred, pt replied "I just lost weight. Why do you need to know that?". Reviewed wt hx, noted pt has experienced a 5.44% wt loss over the past 3 months, which is not significant for time frame.   RD was able to visualize mild to moderate muscle depletions on pt's upper body. Suspect malnutrition, however, unable to identify at this time, due to limited history and pt's refusal of physical exam.   Case discussed with RN after visit, who reports that pt's affect is at baseline (was argumentative with physical therapy earlier this morning).   Case discussed with Dr. Florene Glen (hospitalist) via secure chat. Received permission to liberalize diet.   Labs reviewed.   NUTRITION - FOCUSED PHYSICAL EXAM:    Most Recent Value  Orbital Region  Moderate depletion  Upper Arm Region  Moderate depletion  Thoracic and Lumbar Region  Unable to assess  Buccal Region  Mild depletion  Temple Region  Moderate depletion  Clavicle Bone Region  Unable to assess  Clavicle and Acromion Bone Region  Unable to assess  Scapular Bone Region  Unable to assess  Dorsal Hand  Moderate depletion  Patellar Region  Unable to assess  Anterior Thigh Region  Unable to assess  Posterior Calf Region  Unable to assess  Edema (RD Assessment)  Unable to assess  Hair  Unable to assess  Eyes  Unable to assess  Mouth  Unable to assess  Skin  Unable to assess  Nails  Unable to assess  Diet Order:   Diet Order            Diet regular Room service appropriate? Yes; Fluid consistency: Thin  Diet effective now              EDUCATION NEEDS:   Not appropriate for education at this time  Skin:  Skin Assessment: Reviewed RN Assessment  Last BM:  01/05/18  Height:   Ht Readings from Last 1 Encounters:  01/04/18 5\' 7"  (1.702 m)    Weight:   Wt Readings from Last 1 Encounters:   01/06/18 71.2 kg    Ideal Body Weight:  61.4 kg  BMI:  Body mass index is 24.57 kg/m.  Estimated Nutritional Needs:   Kcal:  1800-2000  Protein:  95-110 grams  Fluid:  1.8-2.0 L    Boneta Standre A. Jimmye Norman, RD, LDN, CDE Pager: [phone] After hours Pager: [phone]

## 2018-01-05 NOTE — Progress Notes (Signed)
PT Cancellation Note  Patient Details Name: LAYNI KREAMER MRN: [id number] DOB: Mar 13, 1941   Cancelled Treatment:    Reason Eval/Treat Not Completed: Medical issues which prohibited therapy. Pt with new PE, started on Heparin last night, but has not yet been on Heparin for 24 hours.  Will reattempt.  Mabeline Caras, PT, DPT Acute Rehabilitation Services  Pager [phone] Office [place] 01/05/2018, 10:23 AM

## 2018-01-05 NOTE — Progress Notes (Signed)
ANTICOAGULATION CONSULT NOTE - Follow Up Consult  Pharmacy Consult for heparin Indication: pulmonary embolus  Labs: Recent Labs    01/04/18 1632 01/05/18 0502  HGB 14.7 12.9  HCT 47.8* 42.1  PLT 198 192  HEPARINUNFRC  --  0.98*  CREATININE 0.68  --     Assessment: 77yo female supratherapeutic on heparin with initial dosing for PE; no gtt issues or signs of bleeding per RN.  Goal of Therapy:  Heparin level 0.3-0.7 units/ml   Plan:  Will decrease heparin gtt by 2 units/kg/hr to 950 units/hr and check level in 8 hours.    Wynona Neat, PharmD, BCPS  01/05/2018,6:07 AM

## 2018-01-06 ENCOUNTER — Inpatient Hospital Stay (HOSPITAL_COMMUNITY): Payer: Medicare Other

## 2018-01-06 DIAGNOSIS — R52 Pain, unspecified: Secondary | ICD-10-CM

## 2018-01-06 LAB — CBC
HEMATOCRIT: 39.3 % (ref 36.0–46.0)
Hemoglobin: 11.7 g/dL — ABNORMAL LOW (ref 12.0–15.0)
MCH: 23.7 pg — ABNORMAL LOW (ref 26.0–34.0)
MCHC: 29.8 g/dL — ABNORMAL LOW (ref 30.0–36.0)
MCV: 79.6 fL — AB (ref 80.0–100.0)
PLATELETS: 200 10*3/uL (ref 150–400)
RBC: 4.94 MIL/uL (ref 3.87–5.11)
RDW: 17.4 % — ABNORMAL HIGH (ref 11.5–15.5)
WBC: 8 10*3/uL (ref 4.0–10.5)
nRBC: 0 % (ref 0.0–0.2)

## 2018-01-06 LAB — ECHOCARDIOGRAM COMPLETE
Height: 67 in
Weight: 2483.26 oz

## 2018-01-06 LAB — BASIC METABOLIC PANEL
ANION GAP: 5 (ref 5–15)
BUN: <5 mg/dL — ABNORMAL LOW (ref 8–23)
CALCIUM: 7.8 mg/dL — AB (ref 8.9–10.3)
CHLORIDE: 102 mmol/L (ref 98–111)
CO2: 32 mmol/L (ref 22–32)
Creatinine, Ser: 0.74 mg/dL (ref 0.44–1.00)
GFR calc non Af Amer: >60 mL/min (ref >60)
Glucose, Bld: 110 mg/dL — ABNORMAL HIGH (ref 70–99)
Potassium: 3.5 mmol/L (ref 3.5–5.1)
Sodium: 139 mmol/L (ref 135–145)

## 2018-01-06 LAB — HEPARIN LEVEL (UNFRACTIONATED)
Heparin Unfractionated: <0.1 IU/mL — ABNORMAL LOW (ref 0.30–0.70)
Heparin Unfractionated: 0.32 IU/mL (ref 0.30–0.70)

## 2018-01-06 LAB — MAGNESIUM: Magnesium: 1.9 mg/dL (ref 1.7–2.4)

## 2018-01-06 MED ORDER — APIXABAN 5 MG PO TABS
10.0000 mg | ORAL_TABLET | Freq: Two times a day (BID) | ORAL | Status: DC
  Administered 2018-01-06 – 2018-01-07 (×2): 10 mg via ORAL
  Filled 2018-01-06 (×3): qty 2

## 2018-01-06 MED ORDER — BOOST / RESOURCE BREEZE PO LIQD CUSTOM
1.0000 | Freq: Three times a day (TID) | ORAL | Status: DC
  Administered 2018-01-06 – 2018-01-07 (×3): 1 via ORAL

## 2018-01-06 MED ORDER — HYDROXYZINE HCL 25 MG PO TABS
50.0000 mg | ORAL_TABLET | Freq: Once | ORAL | Status: AC
  Administered 2018-01-06: 50 mg via ORAL
  Filled 2018-01-06: qty 2

## 2018-01-06 MED ORDER — ADULT MULTIVITAMIN W/MINERALS CH
1.0000 | ORAL_TABLET | Freq: Every day | ORAL | Status: DC
  Administered 2018-01-06 – 2018-01-07 (×2): 1 via ORAL
  Filled 2018-01-06 (×2): qty 1

## 2018-01-06 MED ORDER — LORATADINE 10 MG PO TABS
10.0000 mg | ORAL_TABLET | Freq: Every day | ORAL | Status: DC
  Administered 2018-01-06 – 2018-01-07 (×2): 10 mg via ORAL
  Filled 2018-01-06: qty 1

## 2018-01-06 MED ORDER — APIXABAN 5 MG PO TABS: 5.0000 mg | ORAL_TABLET | Freq: Two times a day (BID) | ORAL | Status: DC

## 2018-01-06 NOTE — NC FL2 (Signed)
[place] LEVEL OF CARE SCREENING TOOL     IDENTIFICATION  Patient Name: Denise Kelly Birthdate: 1940-07-13 Sex: female Admission Date (Current Location): 01/04/2018  [facility] and [place] Number:  Herbalist and Address:  [facility], [place] [address], [place], [place] [address]      Provider Number: [id number]  Attending Physician Name and Address:  Elodia Florence., *  Relative Name and Phone Number:  Jena Gauss [phone]    Current Level of Care: Hospital Recommended Level of Care: [place] Prior Approval Number:    Date Approved/Denied: 01/06/14 PASRR Number: [id number]  Discharge Plan: SNF    Current Diagnoses: Patient Active Problem List   Diagnosis Date Noted  . Acute pulmonary embolus ([place]) 01/04/2018  . Acute encephalopathy 09/28/2017  . Constipation, slow transit 09/28/2017  . Unsteady gait 04/28/2017  . Spondylolisthesis 07/17/2016  . Bilateral lower extremity edema 07/17/2016  . Chronic pain syndrome   . Depression 05/23/2016  . Chronic back pain 02/25/2016  . Lesion of liver 02/25/2016  . History of fracture of left hip 11/28/2015  . Fall 11/28/2015  . RLS (restless legs syndrome) 11/28/2015  . Confusion 03/14/2014  . Recurrent UTI 03/14/2014  . Effusion of right knee 02/22/2014  . Alcohol dependence ([place]) 02/22/2014  . Substance induced mood disorder ([place]) 02/22/2014  . Hepatic encephalopathy ([place])   . Altered mental status 02/21/2014  . SIRS (systemic inflammatory response syndrome) ([place]) 02/21/2014  . S/P total knee arthroplasty 01/02/2014  . Essential hypertension, benign 11/10/2013  . LAFB (left anterior fascicular block) 11/10/2013  . Obesity, unspecified 11/10/2013    Orientation RESPIRATION BLADDER Height & Weight     Self, Time, Situation, Place  Normal Incontinent Weight: 156 lb 14.4 oz (71.2 kg) Height:  5\' 7"  (170.2 cm)  BEHAVIORAL SYMPTOMS/MOOD  NEUROLOGICAL BOWEL NUTRITION STATUS  Other (Comment)(Anxious. agitated.) (None) Incontinent Diet(Regular)  AMBULATORY STATUS COMMUNICATION OF NEEDS Skin   Limited Assist Verbally Other (Comment)(Cracking, MASD.)                       Personal Care Assistance Level of Assistance  Bathing, Feeding, Dressing Bathing Assistance: Limited assistance Feeding assistance: Limited assistance Dressing Assistance: Limited assistance     Functional Limitations Info  Sight, Hearing, Speech Sight Info: Adequate Hearing Info: Adequate Speech Info: Adequate    SPECIAL CARE FACTORS FREQUENCY  PT (By licensed PT), OT (By licensed OT)     PT Frequency: 5 x week OT Frequency: 5 x week            Contractures Contractures Info: Not present    Additional Factors Info  Code Status, Allergies, Isolation Precautions Code Status Info: DNR Allergies Info: Sulfa Antibiotics, Coreg (Carvedilol), Motrin (Ibuprofen), Toprol Xl (Metoprolol Tartrate), Doxycycline Hyclate, Flovent Hfa (Fluticasone), Statins     Isolation Precautions Info: Contact: OMDRO     Current Medications (01/06/2018):  This is the current hospital active medication list Current Facility-Administered Medications  Medication Dose Route Frequency Provider Last Rate Last Dose  . 0.9 %  sodium chloride infusion   Intravenous Continuous Nita Sells, MD 75 mL/hr at 01/06/18 1348    . 0.9 %  sodium chloride infusion   Intravenous PRN Patrecia Pour, MD   Stopped at 01/05/18 1318  . acetaminophen (TYLENOL) tablet 650 mg  650 mg Oral Q6H PRN Schorr, Rhetta Mura, NP   650 mg at 01/05/18 2231  . acidophilus (RISAQUAD)  capsule 2 capsule  2 capsule Oral Once per day on Mon Wed Fri Nita Sells, MD   2 capsule at 01/06/18 3953  . apixaban (ELIQUIS) tablet 10 mg  10 mg Oral BID Wynell Balloon, RPH       Followed by  . [START ON 01/13/2018] apixaban (ELIQUIS) tablet 5 mg  5 mg Oral BID Wynell Balloon, RPH      .  aspirin EC tablet 81 mg  81 mg Oral QHS Nita Sells, MD   81 mg at 01/05/18 2054  . diazepam (VALIUM) tablet 2 mg  2 mg Oral Q8H PRN Patrecia Pour, MD   2 mg at 01/05/18 2140  . diphenhydrAMINE (BENADRYL) capsule 25 mg  25 mg Oral Q4H PRN Schorr, Rhetta Mura, NP   25 mg at 01/06/18 0825  . feeding supplement (BOOST / RESOURCE BREEZE) liquid 1 Container  1 Container Oral TID BM Elodia Florence., MD   1 Container at 01/06/18 1422  . guaiFENesin-dextromethorphan (ROBITUSSIN DM) 100-10 MG/5ML syrup 5 mL  5 mL Oral Q4H PRN Nita Sells, MD   5 mL at 01/05/18 1324  . heparin ADULT infusion 100 units/mL (25000 units/231mL sodium chloride 0.45%)  850 Units/hr Intravenous Continuous Wynell Balloon, RPH 8.5 mL/hr at 01/06/18 1348 850 Units/hr at 01/06/18 1348  . hydrOXYzine (ATARAX/VISTARIL) tablet 25 mg  25 mg Oral Daily PRN Nita Sells, MD   25 mg at 01/05/18 2016  . loperamide (IMODIUM) capsule 2 mg  2 mg Oral BID PRN Nita Sells, MD   2 mg at 01/05/18 1325  . loratadine (CLARITIN) tablet 10 mg  10 mg Oral Daily PRN Patrecia Pour, MD   10 mg at 01/05/18 2016  . loratadine (CLARITIN) tablet 10 mg  10 mg Oral Daily Elodia Florence., MD   10 mg at 01/06/18 1421  . multivitamin with minerals tablet 1 tablet  1 tablet Oral Daily Elodia Florence., MD   1 tablet at 01/06/18 1421  . ondansetron (ZOFRAN-ODT) disintegrating tablet 4 mg  4 mg Oral Q8H PRN Samtani, Jai-Gurmukh, MD      . sorbitol 70 % solution 30 mL  30 mL Oral Daily PRN Nita Sells, MD      . spironolactone (ALDACTONE) tablet 50 mg  50 mg Oral BID Nita Sells, MD   50 mg at 01/06/18 0829  . traZODone (DESYREL) tablet 50 mg  50 mg Oral Daily PRN Nita Sells, MD   50 mg at 01/04/18 2308  . Zinc Oxide (TRIPLE PASTE) 12.8 % ointment 1 application  1 application Topical Daily PRN Nita Sells, MD         Discharge Medications: Please see discharge summary for a  list of discharge medications.  Relevant Imaging Results:  Relevant Lab Results:   Additional Information SS#: [id number]  Candie Chroman, LCSW

## 2018-01-06 NOTE — Plan of Care (Signed)
  Problem: Activity: Goal: Risk for activity intolerance will decrease Outcome: Progressing   Problem: Nutrition: Goal: Adequate nutrition will be maintained Outcome: Progressing   Problem: Coping: Goal: Level of anxiety will decrease Outcome: Progressing   

## 2018-01-06 NOTE — Evaluation (Signed)
Physical Therapy Evaluation Patient Details Name: Denise Kelly MRN: [id number] DOB: 29-Jul-1940 Today's Date: 01/06/2018   History of Present Illness  Denise Kelly is a 77 y.o. female with a history of bipolar disorder, alcohol abuse, recent admission for sepsis, AMS due to UTI who presented from home on the insistence of her daughter for increasing weakness. She had not been acting like herself and fired all home healthcare assistance. Daughter feels she cannot be cared for at home. She was noted to be hypoxic on arrival, though the patient denied any concerns including shortness of breath, but has been walking much less. Evaluation ultimately revealed segmental bilateral pulmonary emboli with evidence of right heart strain and likely evolving RLL pulmonary infarct. Also with hypokalemia (2.8), normal troponin. Heparin infusion was started and patient admitted to telemetry 10/21.   Clinical Impression  Pt admitted with above diagnosis. Pt currently with functional limitations due to the deficits listed below (see PT Problem List). Pt very reluctant to do anything initiallly.  Discovered pt was wet with urine.  With encouragement pt did agree to get to EOB and stand.  Cleaned pt and changed linens.  Pt moved fairly well with what she would agree to.  Pt asked what her goal was and pt stated, "I want to go to hell." Allowed pt to stay in bed as she was very clear that she did not want to do anything and she did allow PT to clean her.  Will follow acutely.   Pt will benefit from skilled PT to increase their independence and safety with mobility to allow discharge to the venue listed below.      Follow Up Recommendations SNF;Supervision/Assistance - 24 hour    Equipment Recommendations  None recommended by PT    Recommendations for Other Services       Precautions / Restrictions Precautions Precautions: Fall Restrictions Weight Bearing Restrictions: No      Mobility  Bed  Mobility Overal bed mobility: Independent             General bed mobility comments: Took incr time but came to EOB without assist.   Transfers Overall transfer level: Needs assistance Equipment used: Rolling walker (2 wheeled) Transfers: Sit to/from Stand Sit to Stand: Min guard         General transfer comment: max encouragement to stand to clean bed as pads were wet due to purewick leaked.  Pt reluctant but finally did stand to be cleaned.  No assist needed to stand to RW.  Refused to walk.   Ambulation/Gait             General Gait Details: Declined  Stairs            Wheelchair Mobility    Modified Rankin (Stroke Patients Only)       Balance Overall balance assessment: Needs assistance Sitting-balance support: Feet supported;No upper extremity supported Sitting balance-Leahy Scale: Fair     Standing balance support: Bilateral upper extremity supported;During functional activity Standing balance-Leahy Scale: Poor Standing balance comment: relies on UE support                             Pertinent Vitals/Pain Pain Assessment: Faces Faces Pain Scale: Hurts even more Pain Location: bil LES Pain Descriptors / Indicators: Aching;Grimacing;Guarding Pain Intervention(s): Limited activity within patient's tolerance;Monitored during session;Repositioned    Home Living Family/patient expects to be discharged to:: Private residence Living Arrangements: Alone Available Help  at Discharge: Family;Available PRN/intermittently Type of Home: Apartment Home Access: Level entry     Home Layout: One level Home Equipment: Shower seat;Bedside commode;Walker - 2 wheels      Prior Function Level of Independence: Independent with assistive device(s)         Comments: walks with RW, independent sponge bathing     Hand Dominance   Dominant Hand: Right    Extremity/Trunk Assessment   Upper Extremity Assessment Upper Extremity Assessment:  Defer to OT evaluation    Lower Extremity Assessment Lower Extremity Assessment: Overall WFL for tasks assessed    Cervical / Trunk Assessment Cervical / Trunk Assessment: Normal  Communication   Communication: No difficulties  Cognition Arousal/Alertness: Awake/alert Behavior During Therapy: Anxious Overall Cognitive Status: Within Functional Limits for tasks assessed                                        General Comments      Exercises     Assessment/Plan    PT Assessment Patient needs continued PT services  PT Problem List Decreased activity tolerance;Decreased balance;Decreased mobility;Decreased knowledge of use of DME;Decreased safety awareness;Decreased knowledge of precautions;Cardiopulmonary status limiting activity       PT Treatment Interventions DME instruction;Gait training;Functional mobility training;Therapeutic activities;Therapeutic exercise;Balance training;Patient/family education    PT Goals (Current goals can be found in the Care Plan section)  Acute Rehab PT Goals Patient Stated Goal: to get better PT Goal Formulation: With patient Time For Goal Achievement: 01/20/18 Potential to Achieve Goals: Good    Frequency Min 3X/week   Barriers to discharge Decreased caregiver support      Co-evaluation               AM-PAC PT "6 Clicks" Daily Activity  Outcome Measure Difficulty turning over in bed (including adjusting bedclothes, sheets and blankets)?: Unable Difficulty moving from lying on back to sitting on the side of the bed? : Unable Difficulty sitting down on and standing up from a chair with arms (e.g., wheelchair, bedside commode, etc,.)?: None Help needed moving to and from a bed to chair (including a wheelchair)?: A Little Help needed walking in hospital room?: A Little Help needed climbing 3-5 steps with a railing? : Total 6 Click Score: 13    End of Session Equipment Utilized During Treatment: Gait  belt Activity Tolerance: Patient limited by fatigue Patient left: with call bell/phone within reach;with bed alarm set;in bed Nurse Communication: Mobility status PT Visit Diagnosis: Unsteadiness on feet (R26.81);Muscle weakness (generalized) (M62.81)    Time: 1749-4496 PT Time Calculation (min) (ACUTE ONLY): 25 min   Charges:   PT Evaluation $PT Eval Moderate Complexity: 1 Mod PT Treatments $Therapeutic Activity: 8-22 mins        Aurorah Schlachter,PT Acute Rehabilitation Services Pager:  [phone]  Office:  [phone]    [name] 01/06/2018, 1:43 PM

## 2018-01-06 NOTE — Evaluation (Signed)
Occupational Therapy Evaluation Patient Details Name: Denise Kelly MRN: [id number] DOB: [birth year] Today's Date: 01/06/2018    History of Present Illness Denise Kelly is a 77 y.o. female with a history of bipolar disorder, alcohol abuse, recent admission for sepsis, AMS due to UTI who presented from home on the insistence of her daughter for increasing weakness. She had not been acting like herself and fired all home healthcare assistance. Daughter feels she cannot be cared for at home. She was noted to be hypoxic on arrival, though the patient denied any concerns including shortness of breath, but has been walking much less. Evaluation ultimately revealed segmental bilateral pulmonary emboli with evidence of right heart strain and likely evolving RLL pulmonary infarct. Also with hypokalemia (2.8), normal troponin. Heparin infusion was started and patient admitted to telemetry 10/21.    Clinical Impression   Pt was independent with AD prior to admission. Pt unaware of reason for admission to hospital. Requires encouragement for OOB for evaluation of mobility. Pt with repeated requests, difficult to comfort. Pt currently requires set up to min assist for ADL and OOB mobility. She lives alone and will need post acute rehab upon discharge. Will follow.    Follow Up Recommendations  SNF;Supervision/Assistance - 24 hour    Equipment Recommendations       Recommendations for Other Services       Precautions / Restrictions Precautions Precautions: Fall Restrictions Weight Bearing Restrictions: No      Mobility Bed Mobility Overal bed mobility: Independent             General bed mobility comments: HOB up, increased time  Transfers Overall transfer level: Needs assistance Equipment used: 1 person hand held assist Transfers: Sit to/from Stand Sit to Stand: Min assist         General transfer comment: required max encouragement to demonstrate her ability to stand for  purposes of evaluation, hand held assist    Balance Overall balance assessment: Needs assistance Sitting-balance support: Feet supported;No upper extremity supported Sitting balance-Leahy Scale: Fair     Standing balance support: Bilateral upper extremity supported;During functional activity Standing balance-Leahy Scale: Poor Standing balance comment: relies on UE support                           ADL either performed or assessed with clinical judgement   ADL Overall ADL's : Needs assistance/impaired Eating/Feeding: Independent;Bed level   Grooming: Wash/dry hands;Brushing hair;Sitting;Set up;Supervision/safety   Upper Body Bathing: Set up;Sitting   Lower Body Bathing: Sit to/from stand;Moderate assistance   Upper Body Dressing : Set up;Sitting   Lower Body Dressing: Sit to/from stand;Moderate assistance   Toilet Transfer: Min guard;Stand-pivot   Toileting- Clothing Manipulation and Hygiene: Maximal assistance;Sitting/lateral lean         General ADL Comments: pt declining transfer to [facility] or walking to bathroom, although reports she needs to have a BM     Vision Baseline Vision/History: Wears glasses Wears Glasses: Reading only Patient Visual Report: No change from baseline       Perception     Praxis      Pertinent Vitals/Pain Pain Assessment: Faces Faces Pain Scale: Hurts even more Pain Location: with urination Pain Descriptors / Indicators: Burning Pain Intervention(s): Monitored during session;Other (comment)(RN notified)     Hand Dominance Right   Extremity/Trunk Assessment Upper Extremity Assessment Upper Extremity Assessment: Overall WFL for tasks assessed   Lower Extremity Assessment Lower Extremity Assessment: Defer to  PT evaluation   Cervical / Trunk Assessment Cervical / Trunk Assessment: Normal   Communication Communication Communication: No difficulties   Cognition Arousal/Alertness: Awake/alert Behavior During Therapy:  Anxious Overall Cognitive Status: Impaired/Different from baseline Area of Impairment: Memory;Orientation                 Orientation Level: Situation   Memory: Decreased short-term memory         General Comments: pt anxiously and purposelessly playing with her cell phone   General Comments       Exercises     Shoulder Instructions      Home Living Family/patient expects to be discharged to:: Private residence Living Arrangements: Alone Available Help at Discharge: Family;Available PRN/intermittently Type of Home: Apartment Home Access: Level entry     Home Layout: One level     Bathroom Shower/Tub: Teacher, early years/pre: Standard     Home Equipment: Shower seat;Bedside commode;Walker - 2 wheels          Prior Functioning/Environment Level of Independence: Independent with assistive device(s)        Comments: walks with RW, independent sponge bathing, manages her own meds, prepares light meals        OT Problem List: Decreased strength;Decreased activity tolerance;Impaired balance (sitting and/or standing);Decreased cognition;Decreased safety awareness;Decreased knowledge of use of DME or AE;Pain      OT Treatment/Interventions: Self-care/ADL training;DME and/or AE instruction;Cognitive remediation/compensation;Patient/family education;Balance training;Therapeutic activities    OT Goals(Current goals can be found in the care plan section) Acute Rehab OT Goals Patient Stated Goal: agreeable to snf if she can choose the location OT Goal Formulation: With patient Time For Goal Achievement: 01/20/18 Potential to Achieve Goals: Good ADL Goals Pt Will Perform Grooming: with supervision;standing Pt Will Perform Lower Body Bathing: with supervision;sit to/from stand Pt Will Perform Lower Body Dressing: with supervision;sit to/from stand Pt Will Transfer to Toilet: with supervision;ambulating Pt Will Perform Toileting - Clothing  Manipulation and hygiene: with supervision;sit to/from stand  OT Frequency: Min 2X/week   Barriers to D/C:            Co-evaluation              AM-PAC PT "6 Clicks" Daily Activity     Outcome Measure Help from another person eating meals?: None Help from another person taking care of personal grooming?: A Little Help from another person toileting, which includes using toliet, bedpan, or urinal?: A Lot Help from another person bathing (including washing, rinsing, drying)?: A Little Help from another person to put on and taking off regular upper body clothing?: A Little Help from another person to put on and taking off regular lower body clothing?: A Lot 6 Click Score: 17   End of Session Equipment Utilized During Treatment: Gait belt Nurse Communication: Other (comment)(aware of burning with urination)  Activity Tolerance: Patient tolerated treatment well(self limiting) Patient left: in bed;with call bell/phone within reach;with bed alarm set  OT Visit Diagnosis: Unsteadiness on feet (R26.81);Other abnormalities of gait and mobility (R26.89);Pain;Muscle weakness (generalized) (M62.81);Other symptoms and signs involving cognitive function                Time: 1539-1601 OT Time Calculation (min): 22 min Charges:  OT General Charges $OT Visit: 1 Visit OT Evaluation $OT Eval Moderate Complexity: 1 Mod  Nestor Lewandowsky, OTR/L Acute Rehabilitation Services Pager: [phone] Office: [phone]  Malka So 01/06/2018, 4:13 PM

## 2018-01-06 NOTE — Progress Notes (Signed)
Preliminary notes--Bilateral lower extremities venous duplex exam completed. Positive for Acute DVT involving the right femoral vein, and right popliteal vein, the left common femoral vein, left femoral vein, and left popliteal vein.  Limitation: patient was extremely sensitive for compression, exam cannot approach all segments of calf veins. Could not exclude possible calf veins thrombosis existence.  Result notified RN Anderson Malta.  Hongying Landry Mellow (RDMS RVT) 01/06/18 2:36 PM

## 2018-01-06 NOTE — Clinical Social Work Placement (Signed)
   CLINICAL SOCIAL WORK PLACEMENT  NOTE  Date:  01/06/2018  Patient Details  Name: Denise Kelly MRN: [id number] Date of Birth: [birth year]  Clinical Social Work is seeking post-discharge placement for this patient at the [facility] level of care (*CSW will initial, date and re-position this form in  chart as items are completed):      Patient/family provided with [facility] Work Department's list of facilities offering this level of care within the geographic area requested by the patient (or if unable, by the patient's family).      Patient/family informed of their freedom to choose among providers that offer the needed level of care, that participate in Medicare, Medicaid or managed care program needed by the patient, have an available bed and are willing to accept the patient.      Patient/family informed of [facility]'s ownership interest in [facility] and [facility], as well as of the fact that they are under no obligation to receive care at these facilities.  PASRR submitted to EDS on 01/06/18     PASRR number received on       Existing PASRR number confirmed on 01/06/18     FL2 transmitted to all facilities in geographic area requested by pt/family on 01/06/18     FL2 transmitted to all facilities within larger geographic area on       Patient informed that his/her managed care company has contracts with or will negotiate with certain facilities, including the following:            Patient/family informed of bed offers received.  Patient chooses bed at       Physician recommends and patient chooses bed at      Patient to be transferred to   on  .  Patient to be transferred to facility by       Patient family notified on   of transfer.  Name of family member notified:        PHYSICIAN Please sign FL2, Please sign DNR     Additional Comment:    _______________________________________________ Candie Chroman,  LCSW 01/06/2018, 2:56 PM

## 2018-01-06 NOTE — Progress Notes (Addendum)
PROGRESS NOTE    Denise Kelly  PNT:[id number] DOB: [birth year] DOA: 01/04/2018 PCP: [name], MD   Brief Narrative:  Denise Kelly is a 77 y.o. female with a history of bipolar disorder, alcohol abuse, recent admission for sepsis, AMS due to UTI who presented from home on the insistence of her daughter for increasing weakness. She had not been acting like herself and fired all home healthcare assistance. Daughter feels she cannot be cared for at home. She was noted to be hypoxic on arrival, though the patient denied any concerns including shortness of breath, but has been walking much less. Evaluation ultimately revealed segmental bilateral pulmonary emboli with evidence of right heart strain and likely evolving RLL pulmonary infarct. Also with hypokalemia (2.8), normal troponin. Heparin infusion was started and patient admitted to telemetry 10/21.   Assessment & Plan:   Principal Problem:   Acute pulmonary embolus (HCC) Active Problems:   LAFB (left anterior fascicular block)   S/P total knee arthroplasty   Alcohol dependence (HCC)   Substance induced mood disorder (HCC)   Hepatic encephalopathy (HCC)   Confusion   Chronic back pain   Spondylolisthesis   Unsteady gait   Acute bilateral submassive PE with right heart strain  Bilateral Lower Extremity DVT: Possibly provoked by increasingly sedentary lifestyle (on discussion with pt and daughter, she notes she's been essentially in bed almost 24 hrs a day over the past several weeks - months). - Transition to eliquis today - echo with grade 1 diastolic dysfunction - Bilateral LE [place] with extensive bilateral DVT's (final read pending) -> will discuss additionally tomorrow with family, may need to discuss with vascular regarding possible IVC filter in setting of extensive DVT with submassive PE. - currently on room air  Deconditioning  Poor PO intake: Also reported failure to thrive.  - I believe this ties into her immobility  and risk of above, but unclear precipitant for her decline over the past several months.  Recommend, continued evaluation as outpatient.  She's had CT of abdomen/pelvis as well as chest over past month which were only notable for PE above. - RD evaluated, getting supplements.  Liberalized diet.  - PT/OT evaluations ordered -> recommending SNF - Consider palliative care evaluation, pt is DNR. Do not believe this is currently required as an inpatient, but is something to consider as outpatient.  Hypokalemia:  - improved, follow  Bipolar disorder, depressed mood, insomnia: - Trazodone qHS - Avoid anticholinergics as able - Persistently anxious.  low dose diazepam ordered by previous MD, monitor closely for sedation.  History of alcohol abuse: None recently as stated by her daughter.  - No evidence of withdrawal, low threshold to start CIWA.  - Multivitamins  History of UTI: Not characteristic urinalysis findings (no WBCs or bacteriuria, but she is nitrite positive).  Continue to follow for symptoms.  Spondylolisthesis with spinal stenosis s/p fusion: No longer requiring chronic pain medications.  - PT/OT   HTN:  - Continue spironolactone home med.  DVT prophylaxis: eliquis Code Status: DNR Family Communication: daughter at bedside Disposition Plan: pending SNF placement and transition to PO eliquis   Consultants:   none  Procedures:  Echo Study Conclusions  - Left ventricle: The cavity size was normal. Wall thickness was   normal. Systolic function was normal. The estimated ejection   fraction was in the range of 60% to 65%. Wall motion was normal;   there were no regional wall motion abnormalities. Doppler   parameters are consistent  with abnormal left ventricular   relaxation (grade 1 diastolic dysfunction).  LE [place] pending  Antimicrobials:  Anti-infectives (From admission, onward)   None      Subjective: Anxious. Upset she has DVT as well. Discussed plan  for transition to PO anticoagulants and possible d/c if she does well with this.  Objective: Vitals:   01/05/18 1954 01/06/18 0639 01/06/18 1037 01/06/18 1201  BP: 134/86 (!) 149/85 131/86 127/82  Pulse: (!) 102 99 95 (!) 104  Resp: 18 18  18   Temp: 97.9 F (36.6 C) 97.7 F (36.5 C)  98.2 F (36.8 C)  TempSrc: Oral Oral  Oral  SpO2: 94% 97% 96% 96%  Weight:  71.2 kg    Height:        Intake/Output Summary (Last 24 hours) at 01/06/2018 1816 Last data filed at 01/06/2018 1542 Gross per 24 hour  Intake 2876.82 ml  Output 3750 ml  Net -873.18 ml   Filed Weights   01/04/18 2244 01/05/18 0359 01/06/18 0639  Weight: 70.4 kg 70.4 kg 71.2 kg    Examination:  General exam: Appears calm and comfortable  Respiratory system: Clear to auscultation. Respiratory effort normal. Cardiovascular system: S1 & S2 heard, RRR. Gastrointestinal system: Abdomen is nondistended, soft and nontender.  Central nervous system: Alert and oriented. No focal neurological deficits. Extremities: Symmetric 5 x 5 power. Skin: No rashes, lesions or ulcers Psychiatry: Judgement and insight appear normal. Mood & affect appropriate.     Data Reviewed: I have personally reviewed following labs and imaging studies  CBC: Recent Labs  Lab 01/04/18 1632 01/05/18 0502 01/06/18 0026  WBC 11.5* 9.2 8.0  NEUTROABS 8.7*  --   --   HGB 14.7 12.9 11.7*  HCT 47.8* 42.1 39.3  MCV 80.2 79.0* 79.6*  PLT 198 192 696   Basic Metabolic Panel: Recent Labs  Lab 01/04/18 1632 01/05/18 0502 01/06/18 0026  NA 136 138 139  K 2.8* 3.3* 3.5  CL 92* 101 102  CO2 28 30 32  GLUCOSE 108* 100* 110*  BUN <5* <5* <5*  CREATININE 0.68 0.73 0.74  CALCIUM 8.8* 8.3* 7.8*  MG  --  1.5* 1.9   GFR: Estimated Creatinine Clearance: 58.2 mL/min (by C-G formula based on SCr of 0.74 mg/dL). Liver Function Tests: Recent Labs  Lab 01/04/18 1632 01/05/18 0502  AST 23 17  ALT 13 11  ALKPHOS 112 89  BILITOT 0.9 0.9  PROT  6.1* 5.3*  ALBUMIN 3.0* 2.5*   No results for input(s): LIPASE, AMYLASE in the last 168 hours. No results for input(s): AMMONIA in the last 168 hours. Coagulation Profile: No results for input(s): INR, PROTIME in the last 168 hours. Cardiac Enzymes: No results for input(s): CKTOTAL, CKMB, CKMBINDEX, TROPONINI in the last 168 hours. BNP (last 3 results) No results for input(s): PROBNP in the last 8760 hours. HbA1C: No results for input(s): HGBA1C in the last 72 hours. CBG: No results for input(s): GLUCAP in the last 168 hours. Lipid Profile: No results for input(s): CHOL, HDL, LDLCALC, TRIG, CHOLHDL, LDLDIRECT in the last 72 hours. Thyroid Function Tests: No results for input(s): TSH, T4TOTAL, FREET4, T3FREE, THYROIDAB in the last 72 hours. Anemia Panel: No results for input(s): VITAMINB12, FOLATE, FERRITIN, TIBC, IRON, RETICCTPCT in the last 72 hours. Sepsis Labs: No results for input(s): PROCALCITON, LATICACIDVEN in the last 168 hours.  No results found for this or any previous visit (from the past 240 hour(s)).       Radiology Studies:  Ct Angio Chest Pe W And/or Wo Contrast  Result Date: 01/04/2018 CLINICAL DATA:  77 y/o F; generalized weakness. PE suspected, intermediate prob, positive D-dimer. EXAM: CT ANGIOGRAPHY CHEST WITH CONTRAST TECHNIQUE: Multidetector CT imaging of the chest was performed using the standard protocol during bolus administration of intravenous contrast. Multiplanar CT image reconstructions and MIPs were obtained to evaluate the vascular anatomy. CONTRAST:  60 cc Isovue 370 COMPARISON:  02/25/2016 CT angiogram of the chest. FINDINGS: Cardiovascular: Satisfactory opacification of the pulmonary arteries to the segmental level. Bilateral pulmonary emboli in the right main and bilateral segmental pulmonary arteries. No saddle embolus. RV/LV = 1.1. Stable mild cardiomegaly. No pericardial effusion. Mild coronary artery calcific atherosclerosis. Normal caliber  main pulmonary artery and aorta. Mild aortic atherosclerosis. Mediastinum/Nodes: No enlarged mediastinal, hilar, or axillary lymph nodes. Thyroid gland, trachea, and esophagus demonstrate no significant findings. Lungs/Pleura: Centrally lucent wedge-shaped consolidation within the right lower lobe likely representing a pulmonary infarct. No additional consolidation, effusion, or pneumothorax. Upper Abdomen: Multiple small liver hypodensities likely representing cysts, a few are too small to characterize, stable from prior study. Musculoskeletal: No acute fracture. Mild-to-moderate discogenic degenerative changes of the spine. Review of the MIP images confirms the above findings. IMPRESSION: 1. Positive for acute PE with CTevidence of right heart strain (RV/LV Ratio = 1.1) consistent with at least submassive (intermediate risk) PE. The presence of right heart strain has been associated with an increased risk of morbidity and mortality. Please activate Code PE by paging [phone]. 2. Right lower lobe wedge-shaped consolidation likely representing a pulmonary infarct. 3. Mild coronary and aortic calcific atherosclerosis. Critical Value/emergent results were called by telephone at the time of interpretation on 01/04/2018 at 7:41 pm to Dr. Blanchie Dessert , who verbally acknowledged these results. Electronically Signed   By: Kristine Garbe M.D.   On: 01/04/2018 19:42        Scheduled Meds: . acidophilus  2 capsule Oral Once per day on Mon Wed Fri  . apixaban  10 mg Oral BID   Followed by  . [START ON 01/13/2018] apixaban  5 mg Oral BID  . aspirin EC  81 mg Oral QHS  . feeding supplement  1 Container Oral TID BM  . loratadine  10 mg Oral Daily  . multivitamin with minerals  1 tablet Oral Daily  . spironolactone  50 mg Oral BID   Continuous Infusions: . sodium chloride Stopped (01/05/18 1318)     LOS: 2 days    Time spent: over 30 min    Fayrene Helper, MD [facility] Pager [phone]  If 7PM-7AM, please contact night-coverage [email or web address] Password [facility] 01/06/2018, 6:16 PM

## 2018-01-06 NOTE — Clinical Social Work Note (Signed)
Clinical Social Work Assessment  Patient Details  Name: Denise Kelly MRN: [id number] Date of Birth: 09-Jan-1941  Date of referral:  01/06/18               Reason for consult:  Facility Placement, Discharge Planning                Permission sought to share information with:  Chartered certified accountant granted to share information::  Yes, Verbal Permission Granted  Name::        Agency::  [facility]  Relationship::     Contact Information:     Housing/Transportation Living arrangements for the past 2 months:  Apartment Source of Information:  Patient, Medical Team Patient Interpreter Needed:  None Criminal Activity/Legal Involvement Pertinent to Current Situation/Hospitalization:  No - Comment as needed Significant Relationships:  Adult Children Lives with:  Self Do you feel safe going back to the place where you live?  Yes Need for family participation in patient care:  Yes (Comment)  Care giving concerns:  PT recommending SNF once medically stable for discharge.   Social Worker assessment / plan:  CSW met with patient. RN at bedside. CSW introduced role and explained that PT recommendations would be discussed. Patient agreeable to SNF placement at [facility]. CSW sent referral for review. No further concerns. CSW encouraged patient to contact CSW as needed.  CSW will continue to follow patient for support and facilitate discharge to SNF once medically stable.  Employment status:  Retired Forensic scientist:  [employer] PT Recommendations:  [place] / Referral to community resources:  [place]  Patient/Family's Response to care:  Patient agreeable to SNF placement at [facility]. [name] supportive and involved in patient's care. Patient appreciated social work intervention.  Patient/Family's Understanding of and Emotional Response to Diagnosis, Current Treatment, and  Prognosis:  Patient has a good understanding of the reason for admission and her need for rehab prior to returning home. Patient appears pleased with hospital care.  Emotional Assessment Appearance:  Appears stated age Attitude/Demeanor/Rapport:  Engaged, Gracious Affect (typically observed):  Accepting, Appropriate Orientation:  Oriented to Self, Oriented to Place, Oriented to  Time, Oriented to Situation Alcohol / Substance use:  Alcohol Use Psych involvement (Current and /or in the community):  No (Comment)  Discharge Needs  Concerns to be addressed:  Care Coordination Readmission within the last 30 days:  No Current discharge risk:  Dependent with Mobility, Lives alone Barriers to Discharge:  Continued Medical Work up   Candie Chroman, LCSW 01/06/2018, 2:52 PM

## 2018-01-06 NOTE — Progress Notes (Signed)
ANTICOAGULATION CONSULT NOTE - Follow Up Consult  Pharmacy Consult for heparin Indication: pulmonary embolus  Allergies  Allergen Reactions  . Sulfa Antibiotics Hives, Swelling and Other (See Comments)    Reaction:  Facial/eye swelling   . Coreg [Carvedilol] Other (See Comments)    Memory loss  . Motrin [Ibuprofen] Palpitations  . Toprol Xl [Metoprolol Tartrate] Cough  . Doxycycline Hyclate Other (See Comments)    HEARTBURN  . Flovent Hfa [Fluticasone] Itching  . Statins Other (See Comments)    MENTAL STATUS CHANGE    Patient Measurements: Height: 5\' 7"  (170.2 cm) Weight: 156 lb 14.4 oz (71.2 kg) IBW/kg (Calculated) : 61.6 Heparin Dosing Weight: 68 kg  Vital Signs: Temp: 97.7 F (36.5 C) (10/23 0639) Temp Source: Oral (10/23 0639) BP: 149/85 (10/23 0639) Pulse Rate: 99 (10/23 0639)  Labs: Recent Labs    01/04/18 1632  01/05/18 0502 01/05/18 1433 01/06/18 0026 01/06/18 0717  HGB 14.7  --  12.9  --  11.7*  --   HCT 47.8*  --  42.1  --  39.3  --   PLT 198  --  192  --  200  --   HEPARINUNFRC  --    < > 0.98* 0.89* <0.10* 0.32  CREATININE 0.68  --  0.73  --  0.74  --    < > = values in this interval not displayed.    Estimated Creatinine Clearance: 58.2 mL/min (by C-G formula based on SCr of 0.74 mg/dL).  Assessment: Pt is a 72 YOF presenting with generalized weakness. PMH is significant for HTN, alcohol abuse, depression, recent admission for UTI, sepsis and encephalopathy on 12/24/17. CT revealed acute PE with evidence of right heart strain consistent with at least submassive PE.   Anticoag: heparin for acute PE with R heart strain. HL 0.32 on 800 units/hr  Renal: SCr 0.74  Heme/Onc: H&H 11.7/39.1, Plt 200  Goal of Therapy:  Heparin level 0.3-0.7 units/ml Monitor platelets by anticoagulation protocol: Yes   Plan:  Increase heparin drip to 850 units/hr. 1700 HL Daily HL and CBC F/U conversion to oral  Levester Fresh, PharmD, BCPS, BCCCP Clinical  Pharmacist [phone]  Please check AMION for all [place] numbers  01/06/2018 8:53 AM

## 2018-01-06 NOTE — Care Management Note (Signed)
Case Management Note  Patient Details  Name: Denise Kelly MRN: [id number] Date of Birth: Mar 27, 1940  Per pharmacy has has $47.00 copay no prior [facility] listed                Kerin Salen 01/06/2018, 12:27 PM

## 2018-01-06 NOTE — Progress Notes (Signed)
Benefit check for Eliquis; Per [facility] CMA, co pay for Eliquis is $47; Aneta Mins [phone]

## 2018-01-06 NOTE — Progress Notes (Signed)
ANTICOAGULATION CONSULT NOTE - Follow Up Consult  Pharmacy Consult for heparin Indication: pulmonary embolus  Labs: Recent Labs    01/04/18 1632 01/05/18 0502 01/05/18 1433 01/06/18 0026  HGB 14.7 12.9  --  11.7*  HCT 47.8* 42.1  --  39.3  PLT 198 192  --  200  HEPARINUNFRC  --  0.98* 0.89* <0.10*  CREATININE 0.68 0.73  --  0.74    Assessment/Plan:  77yo female now subtherapeutic on heparin though per RN pt kept kinking the IV line, possibly due to anxiety, and heparin gtt was repeatedly interrupted prior to lab draw. Will continue gtt for now at current rate and check additional level.   Wynona Neat, PharmD, BCPS  01/06/2018,2:47 AM

## 2018-01-07 DIAGNOSIS — I82401 Acute embolism and thrombosis of unspecified deep veins of right lower extremity: Secondary | ICD-10-CM

## 2018-01-07 DIAGNOSIS — I82402 Acute embolism and thrombosis of unspecified deep veins of left lower extremity: Secondary | ICD-10-CM

## 2018-01-07 LAB — CBC
HCT: 43.1 % (ref 36.0–46.0)
Hemoglobin: 13 g/dL (ref 12.0–15.0)
MCH: 24 pg — AB (ref 26.0–34.0)
MCHC: 30.2 g/dL (ref 30.0–36.0)
MCV: 79.5 fL — ABNORMAL LOW (ref 80.0–100.0)
PLATELETS: 222 10*3/uL (ref 150–400)
RBC: 5.42 MIL/uL — ABNORMAL HIGH (ref 3.87–5.11)
RDW: 17.8 % — AB (ref 11.5–15.5)
WBC: 8.2 10*3/uL (ref 4.0–10.5)
nRBC: 0 % (ref 0.0–0.2)

## 2018-01-07 LAB — BASIC METABOLIC PANEL
Anion gap: 6 (ref 5–15)
BUN: 7 mg/dL — AB (ref 8–23)
CALCIUM: 8.6 mg/dL — AB (ref 8.9–10.3)
CO2: 29 mmol/L (ref 22–32)
CREATININE: 0.82 mg/dL (ref 0.44–1.00)
Chloride: 103 mmol/L (ref 98–111)
GFR calc Af Amer: >60 mL/min (ref >60)
Glucose, Bld: 164 mg/dL — ABNORMAL HIGH (ref 70–99)
POTASSIUM: 3.3 mmol/L — AB (ref 3.5–5.1)
SODIUM: 138 mmol/L (ref 135–145)

## 2018-01-07 LAB — MAGNESIUM: MAGNESIUM: 1.9 mg/dL (ref 1.7–2.4)

## 2018-01-07 MED ORDER — LORATADINE 10 MG PO TABS: 10.0000 mg | ORAL_TABLET | Freq: Every day | ORAL | 0 refills | Status: DC

## 2018-01-07 MED ORDER — APIXABAN 5 MG PO TABS: 10.0000 mg | ORAL_TABLET | Freq: Two times a day (BID) | ORAL | 0 refills | Status: DC

## 2018-01-07 MED ORDER — POTASSIUM CHLORIDE CRYS ER 20 MEQ PO TBCR
40.0000 meq | EXTENDED_RELEASE_TABLET | Freq: Once | ORAL | Status: AC
  Administered 2018-01-07: 40 meq via ORAL
  Filled 2018-01-07: qty 2

## 2018-01-07 MED ORDER — APIXABAN 5 MG PO TABS: 5.0000 mg | ORAL_TABLET | Freq: Two times a day (BID) | ORAL | 0 refills | Status: DC

## 2018-01-07 MED ORDER — ADULT MULTIVITAMIN W/MINERALS CH: 1.0000 | ORAL_TABLET | Freq: Every day | ORAL | 0 refills | Status: AC

## 2018-01-07 MED ORDER — DIAZEPAM 2 MG PO TABS: 2.0000 mg | ORAL_TABLET | Freq: Three times a day (TID) | ORAL | 0 refills | Status: AC | PRN

## 2018-01-07 MED ORDER — DIPHENHYDRAMINE HCL 25 MG PO CAPS: 25.0000 mg | ORAL_CAPSULE | ORAL | 0 refills | Status: DC | PRN

## 2018-01-07 NOTE — Clinical Social Work Note (Signed)
Patient has a bed at [facility] when stable for discharge. Patient aware.  Dayton Scrape, [place]

## 2018-01-07 NOTE — Clinical Social Work Placement (Signed)
   CLINICAL SOCIAL WORK PLACEMENT  NOTE  Date:  01/07/2018  Patient Details  Name: Denise Kelly MRN: [id number] Date of Birth: June 23, 1940  Clinical Social Work is seeking post-discharge placement for this patient at the [place] level of care (*CSW will initial, date and re-position this form in  chart as items are completed):      Patient/family provided with [place] Work Department's list of facilities offering this level of care within the geographic area requested by the patient (or if unable, by the patient's family).      Patient/family informed of their freedom to choose among providers that offer the needed level of care, that participate in Medicare, Medicaid or managed care program needed by the patient, have an available bed and are willing to accept the patient.      Patient/family informed of [facility]'s ownership interest in [facility] and [facility], as well as of the fact that they are under no obligation to receive care at these facilities.  PASRR submitted to EDS on 01/06/18     PASRR number received on       Existing PASRR number confirmed on 01/06/18     FL2 transmitted to all facilities in geographic area requested by pt/family on 01/06/18     FL2 transmitted to all facilities within larger geographic area on       Patient informed that his/her managed care company has contracts with or will negotiate with certain facilities, including the following:        Yes   Patient/family informed of bed offers received.  Patient chooses bed at [place], [place]     Physician recommends and patient chooses bed at      Patient to be transferred to [facility] on 01/07/18.  Patient to be transferred to facility by [facility]     Patient family notified on 01/07/18 of transfer.  Name of family member notified:  Dalia Heading     PHYSICIAN Please sign DNR, Please prepare prescriptions      Additional Comment:    _______________________________________________ Candie Chroman, LCSW 01/07/2018, 3:43 PM

## 2018-01-07 NOTE — Discharge Summary (Signed)
Physician Discharge Summary  Denise Kelly CXK:[id number] DOB: 1940/04/14 DOA: 01/04/2018  PCP: [name], MD  Admit date: 01/04/2018 Discharge date: 01/07/2018  Time spent: 40 minutes  Recommendations for Outpatient Follow-up:  1. Follow up outpatient CBC/CMP 2. Continue eliquis (pt has 6 more days of 10 mg BID and then will transition to 5 mg BID).  She should be on eliquis at least 6 months and likely indefinitely if sedentary lifestyle does not change and benefit continues to outweigh risks. 3. Continue to work up failure to thrive.  She's had poor PO intake and deconditioning over past several months.  Recommend dietician to follow.  Consider palliative care c/s if pt continues to decline. 4. Prescribed valium as needed for anxiety.  Would try to taper this and transition to SSRI +/- hydroxyzine if tolerated. 5. Mild tachy on discharge, consider adding beta blocker if persistent (suspect 2/2 PE)  Discharge Diagnoses:  Principal Problem:   Acute pulmonary embolus ([place]) Active Problems:   LAFB (left anterior fascicular block)   S/P total knee arthroplasty   Alcohol dependence ([place])   Substance induced mood disorder (H[facility])   Hepatic encephalopathy (H[facility])   Confusion   Chronic back pain   Spondylolisthesis   Unsteady gait   Discharge Condition: stable  Diet recommendation: regular  Filed Weights   01/05/18 0359 01/06/18 0639 01/07/18 0451  Weight: 70.4 kg 71.2 kg 70.8 kg    History of present illness:  Denise Fiola Johnstonis a77 y.o.femalewith a history of bipolar disorder, alcohol abuse, recent admission for sepsis, AMS due to UTI who presented from home on the insistence of her daughter for increasing weakness. She had not been acting like herself and fired all home healthcare assistance. Daughter feels she cannot be cared for at home. She was noted to be hypoxic on arrival, though the patient denied any concerns including shortness of breath, but has been walking much  less. Evaluation ultimately revealed segmental bilateral pulmonary emboli with evidence of right heart strain and likely evolving RLL pulmonary infarct. Also with hypokalemia (2.8), normal troponin. Heparin infusion was started and patient admitted to telemetry 10/21.  She was admitted for submassive PE with evidence of R heart strain on CT.  She was initially treated with heparin and eventually transitioned to eliquis.  She had extensive lower extremity DVT's and vascular was consulted regarding possible IVC filter, but recommending anticoagulation and no filter.  She was discharged to SNF.  Of note, pt with FTT over past several months, recommending continued evaluation as outpatient.   Hospital Course:  Acute bilateral submassive PE with right heart strain  Bilateral Lower Extremity DVT  Pulmonary Infarct: Possibly provoked by increasingly sedentary lifestyle (on discussion with pt and daughter, she notes she's been essentially in bed almost 24 hrs a day over the past several weeks - months). - Transitioned to eliquis -> she'll need at least 6 months of anticoagulation and likely lifelong given the thought this was due to her sedentary lifestyle (or until risk outweighs benefits or sedentary lifestyle changes) - echo with grade 1 diastolic dysfunction - Bilateral LE [place] with extensive bilateral DVT's -> discussed with vascular who recommend anticoagulation and no IVC filter.  F/u with Dr. Donzetta Matters as outpatient prn. - currently on room air  Deconditioning  Poor PO intake  Failure to thrive: Also reported failure to thrive.  - I believe this ties into her immobility and risk of above, but unclear precipitant for her decline over the past several months.  Recommend, continued evaluation as outpatient.  She's had CT of abdomen/pelvis as well as chest over past month which were only notable for PE above. - RD evaluated, getting supplements.  Liberalized diet.  - PT/OT evaluations ordered ->  recommending SNF - Consider palliative care evaluation, pt is DNR. Do not believe this is currently required as an inpatient, but is something to consider and continue to discuss with pt and family if she continues to decline (and no identified precipitant).  Hypokalemia:  - follow outpatient  Bipolar disorder, depressed mood, insomnia: - Trazodone qHS - Avoid anticholinergics as able - Persistently anxious.  low dose diazepam ordered by previous MD, will continue on d/c to SNF, but would recommend short term use, consider transitioning to SSRI +/- hydroxyzine  History of alcohol abuse: None recently as stated by her daughter.  - No evidence of withdrawal, low threshold to start CIWA.  - Multivitamins  History of UTI: Not characteristic urinalysis findings (no WBCs or bacteriuria, but she is nitrite positive).  Continue to follow for symptoms.  Spondylolisthesis with spinal stenosis s/p fusion: No longer requiring chronic pain medications.  - PT/OT   HTN:  - Continue spironolactone home med.  Procedures: LE [place] Summary: Right: Findings consistent with acute deep vein thrombosis involving the right femoral vein, and right popliteal vein. Left: Findings consistent with acute deep vein thrombosis involving the left common femoral vein, left femoral vein, and left popliteal vein.  Echo Study Conclusions  - Left ventricle: The cavity size was normal. Wall thickness was   normal. Systolic function was normal. The estimated ejection   fraction was in the range of 60% to 65%. Wall motion was normal;   there were no regional wall motion abnormalities. Doppler   parameters are consistent with abnormal left ventricular   relaxation (grade 1 diastolic dysfunction).  Consultations:  vascular  Discharge Exam: Vitals:   01/07/18 0451 01/07/18 0953  BP: (!) 139/93 (!) 136/99  Pulse: (!) 104 (!) 103  Resp: 18   Temp: 98.4 F (36.9 C) 97.6 F (36.4 C)  SpO2: 92% 98%   No  pain.  Discussed plan with pt and daughter (discussed plan to possibly c/s vascular for IVC filter, pt and daughter interested in further discussion, so vascular consulted). Pt ready to go to SNF.  General: No acute distress. Cardiovascular: Heart sounds show a regular rate, and rhythm.  Lungs: Clear to auscultation bilaterally  Abdomen: Soft, nontender, nondistended  Neurological: Alert and oriented 3. Moves all extremities 4. Cranial nerves II through XII grossly intact. Skin: Warm and dry. No rashes or lesions. Extremities: No clubbing or cyanosis. Bilateral LEE Psychiatric: Mood and affect are normal. Insight and judgment are appropriate.  Discharge Instructions   Discharge Instructions    Call MD for:  difficulty breathing, headache or visual disturbances   Complete by:  As directed    Call MD for:  extreme fatigue   Complete by:  As directed    Call MD for:  persistant dizziness or light-headedness   Complete by:  As directed    Call MD for:  persistant nausea and vomiting   Complete by:  As directed    Call MD for:  redness, tenderness, or signs of infection (pain, swelling, redness, odor or green/yellow discharge around incision site)   Complete by:  As directed    Call MD for:  severe uncontrolled pain   Complete by:  As directed    Call MD for:  temperature >100.4  Complete by:  As directed    Diet - low sodium heart healthy   Complete by:  As directed    Discharge instructions   Complete by:  As directed    You were seen for a pulmonary embolism.  You also had blood clots in your legs.  We're treating you with eliquis.  This should be continued at least 6 months and potentially longer (if sedentary lifestyle does not change and benefit continues to outweigh risks).  Please continue to follow up with your outpatient provider for your failure to thrive.  Return for new, recurrent, or worsening symptoms.  Please ask your PCP to request records from this  hospitalization so they know what was done and what the next steps will be.   Increase activity slowly   Complete by:  As directed      Allergies as of 01/07/2018      Reactions   Sulfa Antibiotics Hives, Swelling, Other (See Comments)   Reaction:  Facial/eye swelling    Coreg [carvedilol] Other (See Comments)   Memory loss   Motrin [ibuprofen] Palpitations   Toprol Xl [metoprolol Tartrate] Cough   Doxycycline Hyclate Other (See Comments)   HEARTBURN   Flovent Hfa [fluticasone] Itching   Statins Other (See Comments)   MENTAL STATUS CHANGE      Medication List    STOP taking these medications   ciprofloxacin 500 MG tablet Commonly known as:  CIPRO   oxyCODONE 5 MG immediate release tablet Commonly known as:  Oxy IR/ROXICODONE     TAKE these medications   acidophilus Caps capsule Take 2 capsules by mouth daily. What changed:  when to take this   apixaban 5 MG Tabs tablet Commonly known as:  ELIQUIS Take 2 tablets (10 mg total) by mouth 2 (two) times daily for 6 days. (starting 10/24 PM)   apixaban 5 MG Tabs tablet Commonly known as:  ELIQUIS Take 1 tablet (5 mg total) by mouth 2 (two) times daily. Start taking on:  01/13/2018   aspirin EC 81 MG tablet Take 81 mg by mouth at bedtime.   diazepam 2 MG tablet Commonly known as:  VALIUM Take 1 tablet (2 mg total) by mouth every 8 (eight) hours as needed for up to 5 days for anxiety.   diphenhydrAMINE 25 mg capsule Commonly known as:  BENADRYL Take 1 capsule (25 mg total) by mouth every 4 (four) hours as needed for itching.   estradiol 0.1 MG/GM vaginal cream Commonly known as:  ESTRACE Place 1 Applicatorful vaginally at bedtime as needed (for vaginal discomfort).   gabapentin 100 MG capsule Commonly known as:  NEURONTIN Take 2 capsules (200 mg total) by mouth 2 (two) times daily.   hydrOXYzine 25 MG tablet Commonly known as:  ATARAX/VISTARIL Take 25 mg by mouth daily as needed for anxiety (and/or sleep).    loperamide 2 MG capsule Commonly known as:  IMODIUM Take 1 capsule (2 mg total) by mouth 2 (two) times daily as needed for diarrhea or loose stools.   loratadine 10 MG tablet Commonly known as:  CLARITIN Take 1 tablet (10 mg total) by mouth daily. Start taking on:  01/08/2018   multivitamin with minerals Tabs tablet Take 1 tablet by mouth daily. Start taking on:  01/08/2018   ondansetron 4 MG disintegrating tablet Commonly known as:  ZOFRAN-ODT Take 4 mg by mouth every 8 (eight) hours as needed.   spironolactone 50 MG tablet Commonly known as:  ALDACTONE Take 50 mg by  mouth 2 (two) times daily.   traZODone 50 MG tablet Commonly known as:  DESYREL Take 1 tablet (50 mg total) by mouth daily as needed for sleep.   Zinc Oxide 12.8 % ointment Commonly known as:  TRIPLE PASTE Apply topically as needed for irritation. What changed:    how much to take  when to take this      Allergies  Allergen Reactions  . Sulfa Antibiotics Hives, Swelling and Other (See Comments)    Reaction:  Facial/eye swelling   . Coreg [Carvedilol] Other (See Comments)    Memory loss  . Motrin [Ibuprofen] Palpitations  . Toprol Xl [Metoprolol Tartrate] Cough  . Doxycycline Hyclate Other (See Comments)    HEARTBURN  . Flovent Hfa [Fluticasone] Itching  . Statins Other (See Comments)    MENTAL STATUS CHANGE   Contact information for after-discharge care    Destination    [facility] Preferred SNF .   Service:  Skilled Nursing Contact information: [place] [phone]               The results of significant diagnostics from this hospitalization (including imaging, microbiology, ancillary and laboratory) are listed below for reference.    Significant Diagnostic Studies: Dg Chest 2 View  Result Date: 01/04/2018 CLINICAL DATA:  Cough EXAM: CHEST - 2 VIEW COMPARISON:  Chest x-ray dated 09/28/2017. FINDINGS: New subtle  rounded opacity within the RIGHT mid lung region, only appreciable on the AP view. LEFT lung is clear. No pleural effusion or pneumothorax seen. Heart size and mediastinal contours are stable. IMPRESSION: New subtle rounded opacity within the RIGHT mid lung region, suspicious for early developing pneumonia. Neoplastic process is not excluded. Consider chest CT for further characterization. At minimum, recommend follow-up chest x-ray to ensure resolution. Electronically Signed   By: Franki Cabot M.D.   On: 01/04/2018 17:56   Ct Angio Chest Pe W And/or Wo Contrast  Result Date: 01/04/2018 CLINICAL DATA:  77 y/o F; generalized weakness. PE suspected, intermediate prob, positive D-dimer. EXAM: CT ANGIOGRAPHY CHEST WITH CONTRAST TECHNIQUE: Multidetector CT imaging of the chest was performed using the standard protocol during bolus administration of intravenous contrast. Multiplanar CT image reconstructions and MIPs were obtained to evaluate the vascular anatomy. CONTRAST:  60 cc Isovue 370 COMPARISON:  02/25/2016 CT angiogram of the chest. FINDINGS: Cardiovascular: Satisfactory opacification of the pulmonary arteries to the segmental level. Bilateral pulmonary emboli in the right main and bilateral segmental pulmonary arteries. No saddle embolus. RV/LV = 1.1. Stable mild cardiomegaly. No pericardial effusion. Mild coronary artery calcific atherosclerosis. Normal caliber main pulmonary artery and aorta. Mild aortic atherosclerosis. Mediastinum/Nodes: No enlarged mediastinal, hilar, or axillary lymph nodes. Thyroid gland, trachea, and esophagus demonstrate no significant findings. Lungs/Pleura: Centrally lucent wedge-shaped consolidation within the right lower lobe likely representing a pulmonary infarct. No additional consolidation, effusion, or pneumothorax. Upper Abdomen: Multiple small liver hypodensities likely representing cysts, a few are too small to characterize, stable from prior study. Musculoskeletal: No  acute fracture. Mild-to-moderate discogenic degenerative changes of the spine. Review of the MIP images confirms the above findings. IMPRESSION: 1. Positive for acute PE with CTevidence of right heart strain (RV/LV Ratio = 1.1) consistent with at least submassive (intermediate risk) PE. The presence of right heart strain has been associated with an increased risk of morbidity and mortality. Please activate Code PE by paging [phone]. 2. Right lower lobe wedge-shaped consolidation likely representing a pulmonary infarct. 3. Mild coronary and  aortic calcific atherosclerosis. Critical Value/emergent results were called by telephone at the time of interpretation on 01/04/2018 at 7:41 pm to Dr. Blanchie Dessert , who verbally acknowledged these results. Electronically Signed   By: Kristine Garbe M.D.   On: 01/04/2018 19:42   Ct Abdomen Pelvis W Contrast  Result Date: 12/24/2017 CLINICAL DATA:  UTI.  Abdominal pain. EXAM: CT ABDOMEN AND PELVIS WITH CONTRAST TECHNIQUE: Multidetector CT imaging of the abdomen and pelvis was performed using the standard protocol following bolus administration of intravenous contrast. CONTRAST:  161mL ISOVUE-300 IOPAMIDOL (ISOVUE-300) INJECTION 61% COMPARISON:  09/28/2017 FINDINGS: Lower chest: Mild cardiomegaly.  Bibasilar atelectasis. Hepatobiliary: Scattered hypodensities throughout the liver compatible with small cysts, stable. Gallbladder unremarkable. Pancreas: No focal abnormality or ductal dilatation. Spleen: No focal abnormality.  Normal size. Adrenals/Urinary Tract: No adrenal abnormality. No focal renal abnormality. No stones or hydronephrosis. Urinary bladder is unremarkable. Stomach/Bowel: Descending colonic and sigmoid diverticulosis. No active diverticulitis. Appendix is normal. Stomach and small bowel decompressed, unremarkable. Vascular/Lymphatic: Aortic atherosclerosis. No enlarged abdominal or pelvic lymph nodes. Reproductive: Uterus and adnexa  unremarkable.  No mass. Other: No free fluid or free air. Musculoskeletal: Hardware noted in the left proximal femur from remote injury. No acute bony abnormality. Posterior fusion changes in the lower lumbar spine. IMPRESSION: Left colonic diverticulosis.  No active diverticulitis. No acute findings in the abdomen or pelvis. Bibasilar atelectasis. Mild cardiomegaly. Electronically Signed   By: Rolm Baptise M.D.   On: 12/24/2017 17:34    Microbiology: No results found for this or any previous visit (from the past 240 hour(s)).   Labs: Basic Metabolic Panel: Recent Labs  Lab 01/04/18 1632 01/05/18 0502 01/06/18 0026 01/07/18 0510  NA 136 138 139 138  K 2.8* 3.3* 3.5 3.3*  CL 92* 101 102 103  CO2 28 30 32 29  GLUCOSE 108* 100* 110* 164*  BUN <5* <5* <5* 7*  CREATININE 0.68 0.73 0.74 0.82  CALCIUM 8.8* 8.3* 7.8* 8.6*  MG  --  1.5* 1.9 1.9   Liver Function Tests: Recent Labs  Lab 01/04/18 1632 01/05/18 0502  AST 23 17  ALT 13 11  ALKPHOS 112 89  BILITOT 0.9 0.9  PROT 6.1* 5.3*  ALBUMIN 3.0* 2.5*   No results for input(s): LIPASE, AMYLASE in the last 168 hours. No results for input(s): AMMONIA in the last 168 hours. CBC: Recent Labs  Lab 01/04/18 1632 01/05/18 0502 01/06/18 0026 01/07/18 0510  WBC 11.5* 9.2 8.0 8.2  NEUTROABS 8.7*  --   --   --   HGB 14.7 12.9 11.7* 13.0  HCT 47.8* 42.1 39.3 43.1  MCV 80.2 79.0* 79.6* 79.5*  PLT 198 192 200 222   Cardiac Enzymes: No results for input(s): CKTOTAL, CKMB, CKMBINDEX, TROPONINI in the last 168 hours. BNP: BNP (last 3 results) No results for input(s): BNP in the last 8760 hours.  ProBNP (last 3 results) No results for input(s): PROBNP in the last 8760 hours.  CBG: No results for input(s): GLUCAP in the last 168 hours.     Signed:  Fayrene Helper MD.  [facility] 01/07/2018, 3:19 PM

## 2018-01-07 NOTE — Consult Note (Signed)
Hospital Consult    Reason for Consult: Extensive bilateral lower extremity DVT Referring Physician: Dr. Florene Glen MRN #:  [id number]  History of Present Illness: This is a 77 y.o. female without history of DVT presents with what she says was general failure to thrive.  She was found to have submassive bilateral pulmonary emboli.  She is now placed on anticoagulation.  Was considered for IVC filter but does not want this performed.  She does have bilateral lower extremity swelling with pain but states that this is a chronic issue for many years.  She has been minimally ambulatory for which she says is quite some time.  She has had a left knee surgery in the past.  Past Medical History:  Diagnosis Date  . Alcohol abuse    ETOH and xanax  . Anxiety    Panic attacks   . Arthritis   . Cancer ([place])    skin- basal , squamous , melonoma- right hand  . Cellulitis    right leg  . Complication of anesthesia 2009   "felt drunk for a week after"- AVM- both times- felt drunk  . Depression   . Encephalopathy   . GERD (gastroesophageal reflux disease)   . HOH (hard of hearing)   . HOH (hard of hearing)   . Hypertension    not on mediacations  . Palpitations   . PONV (postoperative nausea and vomiting)   . Spondylolisthesis of lumbosacral region   . UTI (urinary tract infection)    frequently    Past Surgical History:  Procedure Laterality Date  . BREAST SURGERY Right    2 breast biopsies  . 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 COLONOSCOPY  2011   polyps  . EYE SURGERY Bilateral    cataracts  . INTRAMEDULLARY (IM) NAIL INTERTROCHANTERIC Left 11/29/2015   Procedure: LEFT HIP   NAIL;  Surgeon: Renette Butters, MD;  Location: [facility];  Service: Orthopedics;  Laterality: Left;  . JOINT REPLACEMENT Left 09/2009   knee  . LUMBAR FUSION  07/17/2016   Lumbar four-five Posterior lumbar interbody fusion (N/A)  . TONSILLECTOMY    . TOTAL KNEE ARTHROPLASTY Right 01/02/2014   Procedure: TOTAL KNEE ARTHROPLASTY;  Surgeon: [name], MD;  Location: [facility];  Service: Orthopedics;  Laterality: Right;  . TUBAL LIGATION      Allergies  Allergen Reactions  . Sulfa Antibiotics Hives, Swelling and Other (See Comments)    Reaction:  Facial/eye swelling   . Coreg [Carvedilol] Other (See Comments)    Memory loss  . Motrin [Ibuprofen] Palpitations  . Toprol Xl [Metoprolol Tartrate] Cough  . Doxycycline Hyclate Other (See Comments)    HEARTBURN  . Flovent Hfa [Fluticasone] Itching  . Statins Other (See Comments)    MENTAL STATUS CHANGE    Prior to Admission medications   Medication Sig Start Date End Date Taking? Authorizing Provider  acidophilus (RISAQUAD) CAPS capsule Take 2 capsules by mouth daily. Patient taking differently: Take 2 capsules by mouth 3 (three) times a week.  05/29/16  Yes Elgergawy, Silver Huguenin, MD  aspirin EC 81 MG tablet Take 81 mg by mouth at bedtime.    Yes [provider]  estradiol (ESTRACE) 0.1 MG/GM vaginal cream Place 1 Applicatorful vaginally at bedtime as needed (for vaginal discomfort).   Yes [provider]  hydrOXYzine (ATARAX/VISTARIL) 25 MG tablet Take 25 mg by mouth daily as needed for anxiety (and/or sleep).    Yes [provider]  loperamide (IMODIUM) 2 MG capsule Take 1 capsule (2 mg total) by mouth 2 (two) times daily as needed for diarrhea or loose stools. 10/03/17  Yes Nita Sells, MD  ondansetron (ZOFRAN-ODT) 4 MG disintegrating tablet Take 4 mg by mouth every 8 (eight) hours as needed. 12/21/17  Yes [provider]  spironolactone (ALDACTONE) 50 MG tablet Take 50 mg by mouth 2 (two) times daily.  09/16/17  Yes [provider]  traZODone (DESYREL) 50 MG tablet Take 1 tablet (50 mg total) by mouth daily as needed for sleep. 10/03/17  Yes Nita Sells, MD  Zinc Oxide (TRIPLE PASTE) 12.8 % ointment Apply topically as needed for irritation. Patient taking differently: Apply 1  application topically daily as needed for irritation.  10/03/17  Yes Nita Sells, MD  ciprofloxacin (CIPRO) 500 MG tablet Take 1 tablet (500 mg total) by mouth 2 (two) times daily. Patient not taking: Reported on 12/24/2017 10/03/17   Nita Sells, MD  gabapentin (NEURONTIN) 100 MG capsule Take 2 capsules (200 mg total) by mouth 2 (two) times daily. Patient not taking: Reported on 01/04/2018 10/03/17   Nita Sells, MD  oxyCODONE (OXY IR/ROXICODONE) 5 MG immediate release tablet Take 1 tablet (5 mg total) by mouth every 6 (six) hours as needed for severe pain. Patient not taking: Reported on 12/24/2017 10/03/17   Nita Sells, MD    Social History   Socioeconomic History  . Marital status: Divorced    Spouse name: Not on file  . Number of children: Not on file  . Years of education: Not on file  . Highest education level: Not on file  Occupational History  . Not on file  Social Needs  . Financial resource strain: Not on file  . Food insecurity:    Worry: Not on file    Inability: Not on file  . Transportation needs:    Medical: Not on file    Non-medical: Not on file  Tobacco Use  . Smoking status: Former Smoker    Packs/day: 1.00    Years: 20.00    Pack years: 20.00    Last attempt to quit: 03/17/1992    Years since quitting: 25.8  . Smokeless tobacco: Never Used  . Tobacco comment: quit smoking many years ago .  quit 1994  Substance and Sexual Activity  . Alcohol use: No    Comment: unable to get alcohol per family not since September  . Drug use: No  . Sexual activity: Never  Lifestyle  . Physical activity:    Days per week: Not on file    Minutes per session: Not on file  . Stress: Not on file  Relationships  . Social connections:    Talks on phone: Not on file    Gets together: Not on file    Attends religious service: Not on file    Active member of club or organization: Not on file    Attends meetings of clubs or organizations: Not  on file    Relationship status: Not on file  . Intimate partner violence:    Fear of current or ex partner: Not on file    Emotionally abused: Not on file    Physically abused: Not on file    Forced sexual activity: Not on file  Other Topics Concern  . Not on file  Social History Narrative   Lives alone with 3 cats -    Mainly sit at home by herself - rare interactions -  lives in the same apartment complex as her daughter   - daughter - paramedic     Family History  Problem Relation Age of Onset  . Hypertension Other   . Cancer Mother   . Cancer Father     ROS:   Cardiovascular: []  chest pain/pressure []  palpitations []  SOB lying flat []  DOE []  pain in legs while walking [x]  pain in legs at rest [x]  pain in legs at night []  non-healing ulcers [x]   DVT [x]  swelling in legs  Pulmonary: []  productive cough []  asthma/wheezing []  home O2  Neurologic: [x]  weakness in []  arms []  legs []  numbness in []  arms []  legs []  hx of CVA []  mini stroke [] difficulty speaking or slurred speech []  temporary loss of vision in one eye []  dizziness  Hematologic: []  hx of cancer []  bleeding problems []  problems with blood clotting easily  Endocrine:   []  diabetes []  thyroid disease  GI []  vomiting blood []  blood in stool  GU: []  CKD/renal failure []  HD--[]  M/W/F or []  T/T/S []  burning with urination []  blood in urine  Psychiatric: []  anxiety []  depression  Musculoskeletal: []  arthritis []  joint pain  Integumentary: []  rashes []  ulcers  Constitutional: []  fever []  chills   Physical Examination  Vitals:   01/07/18 0451 01/07/18 0953  BP: (!) 139/93 (!) 136/99  Pulse: (!) 104 (!) 103  Resp: 18   Temp: 98.4 F (36.9 C) 97.6 F (36.4 C)  SpO2: 92% 98%   Body mass index is 24.45 kg/m.  General:  nad HENT: temporal wasting Pulmonary: normal non-labored breathing Cardiac: Palpable pedal pulses bilaterally Abdomen: soft, no masses Extremities: Her  legs are minimally edematous although there is no pitting edema, she has tenderness to palpation with very light touch in her bilateral lower extremities Neurologic: A&O X 3; Appropriate Affect  CBC    Component Value Date/Time   WBC 8.2 01/07/2018 0510   RBC 5.42 (H) 01/07/2018 0510   HGB 13.0 01/07/2018 0510   HCT 43.1 01/07/2018 0510   PLT 222 01/07/2018 0510   MCV 79.5 (L) 01/07/2018 0510   MCH 24.0 (L) 01/07/2018 0510   MCHC 30.2 01/07/2018 0510   RDW 17.8 (H) 01/07/2018 0510   LYMPHSABS 1.7 01/04/2018 1632   MONOABS 0.9 01/04/2018 1632   EOSABS 0.0 01/04/2018 1632   BASOSABS 0.1 01/04/2018 1632    BMET    Component Value Date/Time   NA 138 01/07/2018 0510   NA 140 09/19/2016 1617   K 3.3 (L) 01/07/2018 0510   CL 103 01/07/2018 0510   CO2 29 01/07/2018 0510   GLUCOSE 164 (H) 01/07/2018 0510   BUN 7 (L) 01/07/2018 0510   BUN 7 (L) 09/19/2016 1617   CREATININE 0.82 01/07/2018 0510   CALCIUM 8.6 (L) 01/07/2018 0510   GFRNONAA >60 01/07/2018 0510   GFRAA >60 01/07/2018 0510    COAGS: Lab Results  Component Value Date   INR 1.17 06/14/2016   INR 1.16 11/28/2015   INR 1.08 11/30/2014     Non-Invasive Vascular Imaging:   Summary: Right: Findings consistent with acute deep vein thrombosis involving the right femoral vein, and right popliteal vein. Left: Findings consistent with acute deep vein thrombosis involving the left common femoral vein, left femoral vein, and left popliteal vein.   I reviewed her previous CT scan on October 10 which did not demonstrate any concern for May Thurner syndrome  ASSESSMENT/PLAN: This is a 77 y.o. female bilateral PEs that are submassive.  She has minimal swelling of her bilateral lower extremities.  Given her overall condition I think she would be best served with anticoagulation.  I would not place a filter given that she can be anticoagulated right and would only consider IVC filter if she were having complications of blood  thinners.  She can see me as an outpatient on an as-needed basis.  Turrell Severt C. Donzetta Matters, MD [facility] Office: [phone] Pager: [phone]

## 2018-01-07 NOTE — Progress Notes (Signed)
Patient report given to nurse Hinton Dyer at [facility]. Marcille Blanco, RN

## 2018-01-07 NOTE — Clinical Social Work Note (Signed)
CSW facilitated patient discharge including contacting patient family and facility to confirm patient discharge plans. Clinical information faxed to facility and family agreeable with plan. CSW arranged ambulance transport via [facility] to [facility] at 4:45 pm. RN to call report prior to discharge [phone] Room 207).  CSW will sign off for now as social work intervention is no longer needed. Please consult [place] again if new needs arise.  Dayton Scrape, [place]

## 2018-01-14 ENCOUNTER — Other Ambulatory Visit: Payer: Self-pay | Admitting: *Deleted

## 2018-01-15 NOTE — Patient Outreach (Signed)
[facility]) Care Management  01/15/2018  EVERLYNN SAGUN 1940/07/07 [id number]   Attended interdisciplinary team meeting at [facility] in [place] with [facility] team members Jari Pigg and [place]. Patient admitted to snf 01/07/18 after hospital admission for an acute pulmonary emboli.  Nursing states patient was started on Celexa 01/11/18 for behavior issues, has had a psych eval this admit.  Discharge planner states patient lives in the same apartment complex as her daughter and has a part-time care giver.  Discharge planner states patient has refused all home health in the past. Discharge planning meeting set for Monday and patient may d/c the end of next week.   Went by room to speak with patient but patient was in therapy. Left [facility] Packet on bedside table.  Went by PT room and introduced myself to patient and made her aware of the packet in her room.   Plan to see patient at next facility visit next week.   Rutherford Limerick RN, BSN [facility] Acute Care Coordinator [phone]) [employer] [phone]) Toll free office

## 2018-01-20 ENCOUNTER — Other Ambulatory Visit: Payer: Self-pay | Admitting: Gastroenterology

## 2018-01-21 ENCOUNTER — Other Ambulatory Visit: Payer: Self-pay | Admitting: Gastroenterology

## 2018-01-21 DIAGNOSIS — Z86010 Personal history of colonic polyps: Secondary | ICD-10-CM

## 2018-01-23 ENCOUNTER — Encounter (HOSPITAL_COMMUNITY): Payer: Self-pay | Admitting: Emergency Medicine

## 2018-01-23 ENCOUNTER — Other Ambulatory Visit: Payer: Self-pay

## 2018-01-23 ENCOUNTER — Observation Stay (HOSPITAL_COMMUNITY): Payer: Medicare Other

## 2018-01-23 ENCOUNTER — Inpatient Hospital Stay (HOSPITAL_COMMUNITY)
Admission: EM | Admit: 2018-01-23 | Discharge: 2018-01-25 | DRG: 369 | Disposition: A | Discharge status: Skilled Nursing Facility | Payer: Medicare Other | Attending: Internal Medicine | Admitting: Internal Medicine

## 2018-01-23 DIAGNOSIS — Z7901 Long term (current) use of anticoagulants: Secondary | ICD-10-CM

## 2018-01-23 DIAGNOSIS — Z7982 Long term (current) use of aspirin: Secondary | ICD-10-CM

## 2018-01-23 DIAGNOSIS — K226 Gastro-esophageal laceration-hemorrhage syndrome: Secondary | ICD-10-CM | POA: Diagnosis not present

## 2018-01-23 DIAGNOSIS — G2581 Restless legs syndrome: Secondary | ICD-10-CM | POA: Diagnosis present

## 2018-01-23 DIAGNOSIS — K922 Gastrointestinal hemorrhage, unspecified: Secondary | ICD-10-CM

## 2018-01-23 DIAGNOSIS — D62 Acute posthemorrhagic anemia: Secondary | ICD-10-CM | POA: Diagnosis not present

## 2018-01-23 DIAGNOSIS — R5381 Other malaise: Secondary | ICD-10-CM

## 2018-01-23 DIAGNOSIS — K219 Gastro-esophageal reflux disease without esophagitis: Secondary | ICD-10-CM | POA: Diagnosis present

## 2018-01-23 DIAGNOSIS — I2699 Other pulmonary embolism without acute cor pulmonale: Secondary | ICD-10-CM | POA: Diagnosis present

## 2018-01-23 DIAGNOSIS — K920 Hematemesis: Secondary | ICD-10-CM

## 2018-01-23 DIAGNOSIS — Z66 Do not resuscitate: Secondary | ICD-10-CM | POA: Diagnosis present

## 2018-01-23 DIAGNOSIS — D72829 Elevated white blood cell count, unspecified: Secondary | ICD-10-CM | POA: Diagnosis present

## 2018-01-23 DIAGNOSIS — I444 Left anterior fascicular block: Secondary | ICD-10-CM | POA: Diagnosis present

## 2018-01-23 DIAGNOSIS — K259 Gastric ulcer, unspecified as acute or chronic, without hemorrhage or perforation: Secondary | ICD-10-CM | POA: Diagnosis present

## 2018-01-23 DIAGNOSIS — F319 Bipolar disorder, unspecified: Secondary | ICD-10-CM | POA: Diagnosis present

## 2018-01-23 DIAGNOSIS — Z96651 Presence of right artificial knee joint: Secondary | ICD-10-CM | POA: Diagnosis present

## 2018-01-23 DIAGNOSIS — F1020 Alcohol dependence, uncomplicated: Secondary | ICD-10-CM | POA: Diagnosis present

## 2018-01-23 DIAGNOSIS — Z79899 Other long term (current) drug therapy: Secondary | ICD-10-CM

## 2018-01-23 DIAGNOSIS — Z86711 Personal history of pulmonary embolism: Secondary | ICD-10-CM

## 2018-01-23 DIAGNOSIS — H9190 Unspecified hearing loss, unspecified ear: Secondary | ICD-10-CM | POA: Diagnosis present

## 2018-01-23 DIAGNOSIS — Z86718 Personal history of other venous thrombosis and embolism: Secondary | ICD-10-CM

## 2018-01-23 DIAGNOSIS — Z87891 Personal history of nicotine dependence: Secondary | ICD-10-CM

## 2018-01-23 DIAGNOSIS — K3189 Other diseases of stomach and duodenum: Secondary | ICD-10-CM | POA: Diagnosis present

## 2018-01-23 DIAGNOSIS — I10 Essential (primary) hypertension: Secondary | ICD-10-CM | POA: Diagnosis present

## 2018-01-23 DIAGNOSIS — Z85820 Personal history of malignant melanoma of skin: Secondary | ICD-10-CM

## 2018-01-23 DIAGNOSIS — Z981 Arthrodesis status: Secondary | ICD-10-CM

## 2018-01-23 LAB — COMPREHENSIVE METABOLIC PANEL
ALBUMIN: 3.3 g/dL — AB (ref 3.5–5.0)
ALK PHOS: 67 U/L (ref 38–126)
ALT: 27 U/L (ref 0–44)
AST: 25 U/L (ref 15–41)
Anion gap: 8 (ref 5–15)
BILIRUBIN TOTAL: 0.5 mg/dL (ref 0.3–1.2)
BUN: 30 mg/dL — AB (ref 8–23)
CALCIUM: 8.9 mg/dL (ref 8.9–10.3)
CO2: 26 mmol/L (ref 22–32)
Chloride: 100 mmol/L (ref 98–111)
Creatinine, Ser: 0.84 mg/dL (ref 0.44–1.00)
GFR calc Af Amer: >60 mL/min (ref >60)
GFR calc non Af Amer: >60 mL/min (ref >60)
GLUCOSE: 171 mg/dL — AB (ref 70–99)
Potassium: 5.3 mmol/L — ABNORMAL HIGH (ref 3.5–5.1)
Sodium: 134 mmol/L — ABNORMAL LOW (ref 135–145)
TOTAL PROTEIN: 6.1 g/dL — AB (ref 6.5–8.1)

## 2018-01-23 LAB — CBC
HCT: 31.6 % — ABNORMAL LOW (ref 36.0–46.0)
HCT: 35.5 % — ABNORMAL LOW (ref 36.0–46.0)
HCT: 34.4 % — ABNORMAL LOW (ref 36.0–46.0)
HEMOGLOBIN: 10.4 g/dL — AB (ref 12.0–15.0)
Hemoglobin: 9.8 g/dL — ABNORMAL LOW (ref 12.0–15.0)
Hemoglobin: 10.8 g/dL — ABNORMAL LOW (ref 12.0–15.0)
MCH: 25.9 pg — ABNORMAL LOW (ref 26.0–34.0)
MCH: 26.6 pg (ref 26.0–34.0)
MCH: 25.6 pg — ABNORMAL LOW (ref 26.0–34.0)
MCHC: 31 g/dL (ref 30.0–36.0)
MCHC: 30.4 g/dL (ref 30.0–36.0)
MCHC: 30.2 g/dL (ref 30.0–36.0)
MCV: 83.6 fL (ref 80.0–100.0)
MCV: 87.4 fL (ref 80.0–100.0)
MCV: 84.5 fL (ref 80.0–100.0)
NRBC: 0 % (ref 0.0–0.2)
PLATELETS: 203 10*3/uL (ref 150–400)
Platelets: 157 10*3/uL (ref 150–400)
Platelets: 228 10*3/uL (ref 150–400)
RBC: 3.78 MIL/uL — ABNORMAL LOW (ref 3.87–5.11)
RBC: 4.06 MIL/uL (ref 3.87–5.11)
RBC: 4.07 MIL/uL (ref 3.87–5.11)
RDW: 20.4 % — AB (ref 11.5–15.5)
RDW: 21 % — ABNORMAL HIGH (ref 11.5–15.5)
RDW: 21.1 % — AB (ref 11.5–15.5)
WBC: 11.6 10*3/uL — AB (ref 4.0–10.5)
WBC: 9.2 10*3/uL (ref 4.0–10.5)
WBC: 10.1 10*3/uL (ref 4.0–10.5)
nRBC: 0.2 % (ref 0.0–0.2)
nRBC: 0 % (ref 0.0–0.2)

## 2018-01-23 LAB — TYPE AND SCREEN
ABO/RH(D): O NEG
Antibody Screen: NEGATIVE

## 2018-01-23 LAB — CBC WITH DIFFERENTIAL/PLATELET
ABS IMMATURE GRANULOCYTES: 0.1 10*3/uL — AB (ref 0.00–0.07)
BASOS ABS: 0.1 10*3/uL (ref 0.0–0.1)
Basophils Relative: 1 %
Eosinophils Absolute: 0.1 10*3/uL (ref 0.0–0.5)
Eosinophils Relative: 0 %
HEMATOCRIT: 38.1 % (ref 36.0–46.0)
HEMOGLOBIN: 12 g/dL (ref 12.0–15.0)
IMMATURE GRANULOCYTES: 1 %
LYMPHS ABS: 3.8 10*3/uL (ref 0.7–4.0)
LYMPHS PCT: 23 %
MCH: 25.9 pg — ABNORMAL LOW (ref 26.0–34.0)
MCHC: 31.5 g/dL (ref 30.0–36.0)
MCV: 82.3 fL (ref 80.0–100.0)
Monocytes Absolute: 1 10*3/uL (ref 0.1–1.0)
Monocytes Relative: 6 %
NEUTROS ABS: 11.4 10*3/uL — AB (ref 1.7–7.7)
NEUTROS PCT: 69 %
NRBC: 0 % (ref 0.0–0.2)
Platelets: 255 10*3/uL (ref 150–400)
RBC: 4.63 MIL/uL (ref 3.87–5.11)
RDW: 20.2 % — ABNORMAL HIGH (ref 11.5–15.5)
WBC: 16.5 10*3/uL — ABNORMAL HIGH (ref 4.0–10.5)

## 2018-01-23 LAB — URINALYSIS, ROUTINE W REFLEX MICROSCOPIC
BILIRUBIN URINE: NEGATIVE
Glucose, UA: NEGATIVE mg/dL
Hgb urine dipstick: NEGATIVE
Ketones, ur: NEGATIVE mg/dL
Leukocytes, UA: NEGATIVE
NITRITE: NEGATIVE
PH: 8 (ref 5.0–8.0)
Protein, ur: NEGATIVE mg/dL
Specific Gravity, Urine: 1.013 (ref 1.005–1.030)

## 2018-01-23 LAB — APTT
aPTT: 30 seconds (ref 24–36)
aPTT: 68 s — ABNORMAL HIGH (ref 24–36)

## 2018-01-23 LAB — PROTIME-INR
INR: 1.21
Prothrombin Time: 15.2 seconds (ref 11.4–15.2)

## 2018-01-23 LAB — HEPARIN LEVEL (UNFRACTIONATED): Heparin Unfractionated: 1.54 [IU]/mL — ABNORMAL HIGH (ref 0.30–0.70)

## 2018-01-23 LAB — PROCALCITONIN

## 2018-01-23 LAB — POC OCCULT BLOOD, ED: Fecal Occult Bld: POSITIVE — AB

## 2018-01-23 MED ORDER — ONDANSETRON HCL 4 MG/2ML IJ SOLN
4.0000 mg | Freq: Four times a day (QID) | INTRAMUSCULAR | Status: DC | PRN
  Administered 2018-01-23 – 2018-01-25 (×4): 4 mg via INTRAVENOUS
  Filled 2018-01-23 (×5): qty 2

## 2018-01-23 MED ORDER — ONDANSETRON HCL 4 MG/2ML IJ SOLN
4.0000 mg | Freq: Once | INTRAMUSCULAR | Status: AC
  Administered 2018-01-23: 4 mg via INTRAVENOUS
  Filled 2018-01-23: qty 2

## 2018-01-23 MED ORDER — SODIUM CHLORIDE 0.9 % IV BOLUS
1000.0000 mL | Freq: Once | INTRAVENOUS | Status: AC
  Administered 2018-01-23: 1000 mL via INTRAVENOUS

## 2018-01-23 MED ORDER — IOPAMIDOL (ISOVUE-370) INJECTION 76%
INTRAVENOUS | Status: AC
  Filled 2018-01-23: qty 100

## 2018-01-23 MED ORDER — PANTOPRAZOLE SODIUM 40 MG IV SOLR: 40.0000 mg | Freq: Two times a day (BID) | INTRAVENOUS | Status: DC

## 2018-01-23 MED ORDER — IOPAMIDOL (ISOVUE-370) INJECTION 76%
100.0000 mL | Freq: Once | INTRAVENOUS | Status: AC | PRN
  Administered 2018-01-23: 100 mL via INTRAVENOUS

## 2018-01-23 MED ORDER — SODIUM CHLORIDE 0.9 % IV SOLN
8.0000 mg/h | INTRAVENOUS | Status: DC
  Administered 2018-01-23 – 2018-01-24 (×3): 8 mg/h via INTRAVENOUS
  Filled 2018-01-23 (×6): qty 80

## 2018-01-23 MED ORDER — SODIUM CHLORIDE 0.9 % IV SOLN
80.0000 mg | Freq: Once | INTRAVENOUS | Status: AC
  Administered 2018-01-23: 80 mg via INTRAVENOUS
  Filled 2018-01-23: qty 80

## 2018-01-23 MED ORDER — LORAZEPAM 2 MG/ML IJ SOLN
2.0000 mg | INTRAMUSCULAR | Status: DC | PRN
  Administered 2018-01-23: 2 mg via INTRAVENOUS
  Filled 2018-01-23 (×2): qty 1

## 2018-01-23 MED ORDER — HEPARIN (PORCINE) 25000 UT/250ML-% IV SOLN
700.0000 [IU]/h | INTRAVENOUS | Status: AC
  Administered 2018-01-23: 700 [IU]/h via INTRAVENOUS
  Filled 2018-01-23: qty 250

## 2018-01-23 MED ORDER — SODIUM CHLORIDE 0.9 % IV SOLN
INTRAVENOUS | Status: DC
  Administered 2018-01-23: 06:00:00 via INTRAVENOUS

## 2018-01-23 MED ORDER — SODIUM CHLORIDE 0.9 % IV SOLN
INTRAVENOUS | Status: DC
  Administered 2018-01-23: 20:00:00 via INTRAVENOUS

## 2018-01-23 NOTE — ED Triage Notes (Signed)
BIB EMS from [facility]. Reports approx 3 episodes of hematemesis this evening, beginning around 0030. Pt took zofran at facility. No other emesis en route. Redness noted to sacral area.

## 2018-01-23 NOTE — ED Provider Notes (Addendum)
[facility] EMERGENCY DEPARTMENT Provider Note   CSN: [id number] Arrival date & time: 01/23/18  0131     History   Chief Complaint Chief Complaint  Patient presents with  . GI Bleeding  . Hematemesis    HPI Denise Kelly is a 77 y.o. female.  Patient brought to the emergency department by ambulance from assisted living.  Patient reports that she had been feeling nausea all day and then has had 3 episodes of vomiting tonight.  EMS and staff at assisted living report that it was coffee ground-like vomit, concerning for GI bleeding.  Patient denies abdominal pain.  She reports that she recently started Eliquis because of PE.     Past Medical History:  Diagnosis Date  . Alcohol abuse    ETOH and xanax  . Anxiety    Panic attacks   . Arthritis   . Cancer ([place])    skin- basal , squamous , melonoma- right hand  . Cellulitis    right leg  . Complication of anesthesia 2009   "felt drunk for a week after"- AVM- both times- felt drunk  . Depression   . Encephalopathy   . GERD (gastroesophageal reflux disease)   . HOH (hard of hearing)   . HOH (hard of hearing)   . Hypertension    not on mediacations  . Palpitations   . PONV (postoperative nausea and vomiting)   . Spondylolisthesis of lumbosacral region   . UTI (urinary tract infection)    frequently    Patient Active Problem List   Diagnosis Date Noted  . Acute pulmonary embolus ([place]) 01/04/2018  . Acute encephalopathy 09/28/2017  . Constipation, slow transit 09/28/2017  . Unsteady gait 04/28/2017  . Spondylolisthesis 07/17/2016  . Bilateral lower extremity edema 07/17/2016  . Chronic pain syndrome   . Depression 05/23/2016  . Chronic back pain 02/25/2016  . Lesion of liver 02/25/2016  . History of fracture of left hip 11/28/2015  . Fall 11/28/2015  . RLS (restless legs syndrome) 11/28/2015  . Confusion 03/14/2014  . Recurrent UTI 03/14/2014  . Effusion of right knee 02/22/2014  . Alcohol  dependence ([place]) 02/22/2014  . Substance induced mood disorder ([place]) 02/22/2014  . Hepatic encephalopathy ([place])   . Altered mental status 02/21/2014  . SIRS (systemic inflammatory response syndrome) ([place]) 02/21/2014  . S/P total knee arthroplasty 01/02/2014  . Essential hypertension, benign 11/10/2013  . LAFB (left anterior fascicular block) 11/10/2013  . Obesity, unspecified 11/10/2013    Past Surgical History:  Procedure Laterality Date  . BREAST SURGERY Right    2 breast biopsies  . 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 COLONOSCOPY  2011   polyps  . EYE SURGERY Bilateral    cataracts  . INTRAMEDULLARY (IM) NAIL INTERTROCHANTERIC Left 11/29/2015   Procedure: LEFT HIP   NAIL;  Surgeon: Renette Butters, MD;  Location: [facility];  Service: Orthopedics;  Laterality: Left;  . JOINT REPLACEMENT Left 09/2009   knee  . LUMBAR FUSION  07/17/2016   Lumbar four-five Posterior lumbar interbody fusion (N/A)  . TONSILLECTOMY    . TOTAL KNEE ARTHROPLASTY Right 01/02/2014   Procedure: TOTAL KNEE ARTHROPLASTY;  Surgeon: [name], MD;  Location: [facility];  Service: Orthopedics;  Laterality: Right;  . TUBAL LIGATION       OB History   None      Home Medications    Prior to Admission medications   Medication Sig Start  Date End Date Taking? Authorizing Provider  acidophilus (RISAQUAD) CAPS capsule Take 2 capsules by mouth daily. Patient taking differently: Take 2 capsules by mouth 3 (three) times a week.  05/29/16   Elgergawy, Silver Huguenin, MD  apixaban (ELIQUIS) 5 MG TABS tablet Take 2 tablets (10 mg total) by mouth 2 (two) times daily for 6 days. (starting 10/24 PM) 01/07/18 01/13/18  Elodia Florence., MD  apixaban (ELIQUIS) 5 MG TABS tablet Take 1 tablet (5 mg total) by mouth 2 (two) times daily. 01/13/18 02/12/18  Elodia Florence., MD  aspirin EC 81 MG tablet Take 81 mg by mouth at bedtime.     [provider]  diphenhydrAMINE (BENADRYL) 25 mg capsule Take 1  capsule (25 mg total) by mouth every 4 (four) hours as needed for itching. 01/07/18   Elodia Florence., MD  estradiol (ESTRACE) 0.1 MG/GM vaginal cream Place 1 Applicatorful vaginally at bedtime as needed (for vaginal discomfort).    [provider]  gabapentin (NEURONTIN) 100 MG capsule Take 2 capsules (200 mg total) by mouth 2 (two) times daily. Patient not taking: Reported on 01/04/2018 10/03/17   Nita Sells, MD  hydrOXYzine (ATARAX/VISTARIL) 25 MG tablet Take 25 mg by mouth daily as needed for anxiety (and/or sleep).     [provider]  loperamide (IMODIUM) 2 MG capsule Take 1 capsule (2 mg total) by mouth 2 (two) times daily as needed for diarrhea or loose stools. 10/03/17   Nita Sells, MD  loratadine (CLARITIN) 10 MG tablet Take 1 tablet (10 mg total) by mouth daily. 01/08/18 02/07/18  Elodia Florence., MD  Multiple Vitamin (MULTIVITAMIN WITH MINERALS) TABS tablet Take 1 tablet by mouth daily. 01/08/18 02/07/18  Elodia Florence., MD  ondansetron (ZOFRAN-ODT) 4 MG disintegrating tablet Take 4 mg by mouth every 8 (eight) hours as needed. 12/21/17   [provider]  spironolactone (ALDACTONE) 50 MG tablet Take 50 mg by mouth 2 (two) times daily.  09/16/17   [provider]  traZODone (DESYREL) 50 MG tablet Take 1 tablet (50 mg total) by mouth daily as needed for sleep. 10/03/17   Nita Sells, MD  Zinc Oxide (TRIPLE PASTE) 12.8 % ointment Apply topically as needed for irritation. Patient taking differently: Apply 1 application topically daily as needed for irritation.  10/03/17   Nita Sells, MD    Family History Family History  Problem Relation Age of Onset  . Hypertension Other   . Cancer Mother   . Cancer Father     Social History Social History   Tobacco Use  . Smoking status: Former Smoker    Packs/day: 1.00    Years: 20.00    Pack years: 20.00    Last attempt to quit: 03/17/1992    Years since  quitting: 25.8  . Smokeless tobacco: Never Used  . Tobacco comment: quit smoking many years ago .  quit 1994  Substance Use Topics  . Alcohol use: No    Comment: unable to get alcohol per family not since September  . Drug use: No     Allergies   Sulfa antibiotics; Coreg [carvedilol]; Motrin [ibuprofen]; Toprol xl [metoprolol tartrate]; Doxycycline hyclate; Flovent hfa [fluticasone]; and Statins   Review of Systems Review of Systems  Gastrointestinal: Positive for nausea and vomiting. Negative for abdominal pain.  All other systems reviewed and are negative.    Physical Exam Updated Vital Signs BP (!) 119/94   Pulse (!) 118  Temp 97.7 F (36.5 C)   Resp (!) 24   Ht 5\' 7"  (1.702 m)   Wt 71 kg   SpO2 96%   BMI 24.52 kg/m   Physical Exam  Constitutional: She is oriented to person, place, and time. She appears well-developed and well-nourished. No distress.  HENT:  Head: Normocephalic and atraumatic.  Right Ear: Hearing normal.  Left Ear: Hearing normal.  Nose: Nose normal.  Mouth/Throat: Oropharynx is clear and moist and mucous membranes are normal.  Eyes: Pupils are equal, round, and reactive to light. Conjunctivae and EOM are normal.  Neck: Normal range of motion. Neck supple.  Cardiovascular: Regular rhythm, S1 normal and S2 normal. Tachycardia present. Exam reveals no gallop and no friction rub.  No murmur heard. Pulmonary/Chest: Effort normal and breath sounds normal. No respiratory distress. She exhibits no tenderness.  Abdominal: Soft. Normal appearance and bowel sounds are normal. There is no hepatosplenomegaly. There is no tenderness. There is no rebound, no guarding, no tenderness at McBurney's point and negative Murphy's sign. No hernia.  Musculoskeletal: Normal range of motion.  Neurological: She is alert and oriented to person, place, and time. She has normal strength. No cranial nerve deficit or sensory deficit. Coordination normal. GCS eye subscore is 4.  GCS verbal subscore is 5. GCS motor subscore is 6.  Skin: Skin is warm, dry and intact. No rash noted. No cyanosis.  Psychiatric: She has a normal mood and affect. Her speech is normal and behavior is normal. Thought content normal.  Nursing note and vitals reviewed.    ED Treatments / Results  Labs (all labs ordered are listed, but only abnormal results are displayed) Labs Reviewed  CBC WITH DIFFERENTIAL/PLATELET - Abnormal; Notable for the following components:      Result Value   WBC 16.5 (*)    MCH 25.9 (*)    RDW 20.2 (*)    Neutro Abs 11.4 (*)    Abs Immature Granulocytes 0.10 (*)    All other components within normal limits  COMPREHENSIVE METABOLIC PANEL - Abnormal; Notable for the following components:   Sodium 134 (*)    Potassium 5.3 (*)    Glucose, Bld 171 (*)    BUN 30 (*)    Total Protein 6.1 (*)    Albumin 3.3 (*)    All other components within normal limits  POC OCCULT BLOOD, ED - Abnormal; Notable for the following components:   Fecal Occult Bld POSITIVE (*)    All other components within normal limits  PROTIME-INR  APTT  TYPE AND SCREEN    EKG EKG Interpretation  Date/Time:  Saturday January 23 2018 01:44:55 EST Ventricular Rate:  127 PR Interval:    QRS Duration: 85 QT Interval:  317 QTC Calculation: 461 R Axis:   -90 Text Interpretation:  Sinus tachycardia Atrial premature complexes Left anterior fascicular block Low voltage, precordial leads RVH with secondary repolarization abnrm Confirmed by Orpah Greek [phone]) on 01/23/2018 1:58:34 AM   Radiology No results found.  Procedures Procedures (including critical care time)  Medications Ordered in ED Medications  pantoprazole (PROTONIX) 80 mg in sodium chloride 0.9 % 100 mL IVPB (has no administration in time range)  pantoprazole (PROTONIX) 80 mg in sodium chloride 0.9 % 250 mL (0.32 mg/mL) infusion (has no administration in time range)  pantoprazole (PROTONIX) injection 40 mg (has  no administration in time range)     Initial Impression / Assessment and Plan / ED Course  I have reviewed the  triage vital signs and the nursing notes.  Pertinent labs & imaging results that were available during my care of the patient were reviewed by me and considered in my medical decision making (see chart for details).     Patient with recent diagnosis of bilateral submassive PE, initiated on Eliquis, now presents to the ER with coffee-ground emesis.  Patient's vital signs are stable other than mild tachycardia.  She is not hypotensive, hemoglobin is stable.  Patient is not complaining of abdominal pain.  Her abdominal exam is benign.  She has had 3 episodes of vomiting prior to arrival in the ER, none since arrival.  Rectal exam revealed brown stool that was heme positive.  Overall, patient appears stable, however, on Eliquis with GI bleeding, will require hospitalization for monitoring.  He was initiated on Protonix bolus and continuous drip.  Addendum: Discussed with hospitalist.  Will add CT angios to reevaluate previously diagnosed PE.  Also discussed with on-call GI, patient will be seen in the morning, no further recommendations at this time.  Final Clinical Impressions(s) / ED Diagnoses   Final diagnoses:  Hematemesis with nausea  Upper GI bleed    ED Discharge Orders    None       Orpah Greek, MD 01/23/18 0355    Orpah Greek, MD 01/23/18 0400

## 2018-01-23 NOTE — Evaluation (Addendum)
Physical Therapy Evaluation Patient Details Name: Denise Kelly MRN: [id number] DOB: 23-Jan-1941 Today's Date: 01/23/2018   History of Present Illness  Pt is a 77 y.o. F with significant PMH of bipolar disorder, alcohol dependence, presenting with evaluation of coffee ground emesis. Recently admitted from 10/21-10/24 for acute bilateral submassive PE, bilateral lower extremity DVT, and pulmonary infarct. Planned EGD 11/10  Clinical Impression  Pt admitted with above diagnosis. Pt currently with functional limitations due to the deficits listed below (see PT Problem List). Prior to admission, patient states she was residing at [facility] and uses walker for limited ambulation. Currently seems fairly close to functional baseline. Ambulating 15 feet with walker and min guard assist. HR increase to 133 bpm. Presents as high fall risk based on decreased gait speed. Recommend SNF to maximize functional independence and due to decreased caregiver support. Pt will benefit from skilled PT to increase their independence and safety with mobility to allow discharge to the venue listed below.      Follow Up Recommendations SNF;Supervision/Assistance - 24 hour    Equipment Recommendations  None recommended by PT    Recommendations for Other Services       Precautions / Restrictions Precautions Precautions: Fall Precaution Comments: watch HR Restrictions Weight Bearing Restrictions: No      Mobility  Bed Mobility Overal bed mobility: Modified Independent             General bed mobility comments: HOB up  Transfers Overall transfer level: Needs assistance Equipment used: Rolling walker (2 wheeled) Transfers: Sit to/from Stand Sit to Stand: Min guard            Ambulation/Gait Ambulation/Gait assistance: Min guard Gait Distance (Feet): 15 Feet Assistive device: Rolling walker (2 wheeled) Gait Pattern/deviations: Step-through pattern;Trunk flexed;Decreased stride length   Gait  velocity interpretation: <1.8 ft/sec, indicate of risk for recurrent falls General Gait Details: Slow cadence but overall no overt LOB  Stairs            Wheelchair Mobility    Modified Rankin (Stroke Patients Only)       Balance Overall balance assessment: Needs assistance Sitting-balance support: Feet supported;No upper extremity supported Sitting balance-Leahy Scale: Good     Standing balance support: Bilateral upper extremity supported Standing balance-Leahy Scale: Poor Standing balance comment: relies on UE support                             Pertinent Vitals/Pain Pain Assessment: Faces Faces Pain Scale: Hurts a little bit Pain Location: right flank Pain Descriptors / Indicators: Aching Pain Intervention(s): Monitored during session    Home Living Family/patient expects to be discharged to:: Other (Comment)([facility])                      Prior Function Level of Independence: Needs assistance   Gait / Transfers Assistance Needed: limited household ambulator with RW  ADL's / Homemaking Assistance Needed: prior to previous admission and d/c to [facility], was independent with sponge bathing and preparing light meals        Hand Dominance   Dominant Hand: Right    Extremity/Trunk Assessment   Upper Extremity Assessment Upper Extremity Assessment: Overall WFL for tasks assessed    Lower Extremity Assessment Lower Extremity Assessment: Overall WFL for tasks assessed    Cervical / Trunk Assessment Cervical / Trunk Assessment: Normal  Communication   Communication: No difficulties  Cognition Arousal/Alertness: Awake/alert Behavior During Therapy:  WFL for tasks assessed/performed Overall Cognitive Status: History of cognitive impairments - at baseline                                 General Comments: history of bipolar disorder      General Comments      Exercises     Assessment/Plan    PT Assessment Patient  needs continued PT services  PT Problem List Decreased activity tolerance;Decreased balance;Decreased mobility;Decreased knowledge of use of DME;Decreased safety awareness;Decreased knowledge of precautions;Cardiopulmonary status limiting activity       PT Treatment Interventions DME instruction;Gait training;Functional mobility training;Therapeutic activities;Therapeutic exercise;Balance training;Patient/family education    PT Goals (Current goals can be found in the Care Plan section)  Acute Rehab PT Goals Patient Stated Goal: "walk outside." PT Goal Formulation: With patient Time For Goal Achievement: 02/06/18 Potential to Achieve Goals: Good    Frequency Min 2X/week   Barriers to discharge        Co-evaluation               AM-PAC PT "6 Clicks" Daily Activity  Outcome Measure Difficulty turning over in bed (including adjusting bedclothes, sheets and blankets)?: None Difficulty moving from lying on back to sitting on the side of the bed? : A Little Difficulty sitting down on and standing up from a chair with arms (e.g., wheelchair, bedside commode, etc,.)?: Unable Help needed moving to and from a bed to chair (including a wheelchair)?: A Little Help needed walking in hospital room?: A Little Help needed climbing 3-5 steps with a railing? : A Lot 6 Click Score: 16    End of Session Equipment Utilized During Treatment: Gait belt Activity Tolerance: Patient tolerated treatment well Patient left: in bed;with call bell/phone within reach;with bed alarm set Nurse Communication: Mobility status PT Visit Diagnosis: Unsteadiness on feet (R26.81);Muscle weakness (generalized) (M62.81)    Time: 0388-8280 PT Time Calculation (min) (ACUTE ONLY): 16 min   Charges:   PT Evaluation $PT Eval Moderate Complexity: 1 Mod         Ellamae Sia, [place], DPT Acute Rehabilitation Services Pager [phone] Office [phone]  Willy Eddy 01/23/2018, 10:46 AM

## 2018-01-23 NOTE — H&P (View-Only) (Signed)
[facility] Consult  Referring Provider: ER/Pollina, Gwenyth Allegra, MD Primary Care Physician:  [name], MD Primary Gastroenterologist: Dr.Outlaw/[facility]  Reason for Consultation:  coffee Ground emesis  HPI: Denise Kelly is a 77 y.o. female was admitted yesterday with 2 episodes of coffee-ground emesis. Patient reports nausea prior to such episode and describes the vomitus as dark brown in color. She denies noticing black stool or bloody bowel movement. She reports having an endoscopy over 10 years ago in [place]. She denies difficulty swallowing, pain on swallowing, acid reflux or heartburn. She denies unintentional weight loss or loss of appetite. She was recently started on Eliquis after developing acute bilateral submassive PE along with bilateral lower extremity DVT and pulmonary infarct. Patient reports heavy alcohol use in the past, but has not had alcohol for several years. She reports having a colonoscopy about 4 years ago and had removal of polyps and will be due for a surveillance colonoscopy soon. She also takes aspirin 81 mg daily.   Past Medical History:  Diagnosis Date  . Alcohol abuse    ETOH and xanax  . Anxiety    Panic attacks   . Arthritis   . Cancer ([place])    skin- basal , squamous , melonoma- right hand  . Cellulitis    right leg  . Complication of anesthesia 2009   "felt drunk for a week after"- AVM- both times- felt drunk  . Depression   . Encephalopathy   . GERD (gastroesophageal reflux disease)   . HOH (hard of hearing)   . HOH (hard of hearing)   . Hypertension    not on mediacations  . Palpitations   . PONV (postoperative nausea and vomiting)   . Spondylolisthesis of lumbosacral region   . UTI (urinary tract infection)    frequently    Past Surgical History:  Procedure Laterality Date  . BREAST SURGERY Right    2 breast biopsies  . 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 COLONOSCOPY  2011   polyps  . EYE SURGERY  Bilateral    cataracts  . INTRAMEDULLARY (IM) NAIL INTERTROCHANTERIC Left 11/29/2015   Procedure: LEFT HIP   NAIL;  Surgeon: Renette Butters, MD;  Location: [facility];  Service: Orthopedics;  Laterality: Left;  . JOINT REPLACEMENT Left 09/2009   knee  . LUMBAR FUSION  07/17/2016   Lumbar four-five Posterior lumbar interbody fusion (N/A)  . TONSILLECTOMY    . TOTAL KNEE ARTHROPLASTY Right 01/02/2014   Procedure: TOTAL KNEE ARTHROPLASTY;  Surgeon: [name], MD;  Location: [facility];  Service: Orthopedics;  Laterality: Right;  . TUBAL LIGATION      Prior to Admission medications   Medication Sig Start Date End Date Taking? Authorizing Provider  acetaminophen (TYLENOL) 325 MG tablet Take 650 mg by mouth every 4 (four) hours as needed for mild pain.   Yes [provider]  acidophilus (RISAQUAD) CAPS capsule Take 2 capsules by mouth daily. 05/29/16  Yes Elgergawy, Silver Huguenin, MD  apixaban (ELIQUIS) 5 MG TABS tablet Take 1 tablet (5 mg total) by mouth 2 (two) times daily. 01/13/18 02/12/18 Yes Elodia Florence., MD  aspirin EC 81 MG tablet Take 81 mg by mouth at bedtime.    Yes [provider]  citalopram (CELEXA) 10 MG tablet Take 10 mg by mouth daily.   Yes [provider]  estradiol (ESTRACE) 0.1 MG/GM vaginal cream Place 1 Applicatorful vaginally 2 (two) times a week.  Yes [provider]  gabapentin (NEURONTIN) 100 MG capsule Take 2 capsules (200 mg total) by mouth 2 (two) times daily. 10/03/17  Yes Nita Sells, MD  hydrOXYzine (ATARAX/VISTARIL) 25 MG tablet Take 25 mg by mouth at bedtime.    Yes [provider]  loperamide (IMODIUM) 2 MG capsule Take 1 capsule (2 mg total) by mouth 2 (two) times daily as needed for diarrhea or loose stools. 10/03/17  Yes Nita Sells, MD  loratadine (CLARITIN) 10 MG tablet Take 1 tablet (10 mg total) by mouth daily. 01/08/18 02/07/18 Yes Elodia Florence., MD  Multiple Vitamin (MULTIVITAMIN WITH  MINERALS) TABS tablet Take 1 tablet by mouth daily. 01/08/18 02/07/18 Yes Elodia Florence., MD  ondansetron (ZOFRAN-ODT) 4 MG disintegrating tablet Take 4 mg by mouth every 8 (eight) hours as needed for nausea.  12/21/17  Yes [provider]  polyethylene glycol (MIRALAX / GLYCOLAX) packet Take 17 g by mouth daily.   Yes [provider]  rOPINIRole (REQUIP) 0.5 MG tablet Take 0.5 mg by mouth at bedtime.   Yes [provider]  senna-docusate (SENOKOT-S) 8.6-50 MG tablet Take 1 tablet by mouth daily as needed for mild constipation.   Yes [provider]  spironolactone (ALDACTONE) 50 MG tablet Take 50 mg by mouth 2 (two) times daily.  09/16/17  Yes [provider]  traZODone (DESYREL) 50 MG tablet Take 1 tablet (50 mg total) by mouth daily as needed for sleep. Patient taking differently: Take 50 mg by mouth at bedtime.  10/03/17  Yes Nita Sells, MD  Zinc Oxide (TRIPLE PASTE) 12.8 % ointment Apply topically as needed for irritation. Patient taking differently: Apply 1 application topically daily as needed for irritation.  10/03/17  Yes Nita Sells, MD    Current Facility-Administered Medications  Medication Dose Route Frequency Provider Last Rate Last Dose  . 0.9 %  sodium chloride infusion   Intravenous Continuous Elgergawy, Silver Huguenin, MD 75 mL/hr at 01/23/18 0845    . iopamidol (ISOVUE-370) 76 % injection           . LORazepam (ATIVAN) injection 2-3 mg  2-3 mg Intravenous Q1H PRN Shela Leff, MD      . ondansetron (ZOFRAN) injection 4 mg  4 mg Intravenous Q6H PRN Shela Leff, MD      . pantoprazole (PROTONIX) 80 mg in sodium chloride 0.9 % 250 mL (0.32 mg/mL) infusion  8 mg/hr Intravenous Continuous Shela Leff, MD 25 mL/hr at 01/23/18 0536 8 mg/hr at 01/23/18 0536  . [START ON 01/26/2018] pantoprazole (PROTONIX) injection 40 mg  40 mg Intravenous Q12H Shela Leff, MD        Allergies as of 01/23/2018 -  Review Complete 01/23/2018  Allergen Reaction Noted  . Sulfa antibiotics Hives, Swelling, and Other (See Comments) 02/04/2011  . Coreg [carvedilol] Other (See Comments) 07/02/2016  . Motrin [ibuprofen] Palpitations 01/17/2014  . Toprol xl [metoprolol tartrate] Cough 07/02/2016  . Doxycycline hyclate Other (See Comments) 07/02/2016  . Flovent hfa [fluticasone] Itching 07/02/2016  . Statins Other (See Comments) 07/02/2016    Family History  Problem Relation Age of Onset  . Hypertension Other   . Cancer Mother   . Cancer Father     Social History   Socioeconomic History  . Marital status: Divorced    Spouse name: Not on file  . Number of children: Not on file  . Years of education: Not on file  . Highest education level: Not on file  Occupational  History  . Not on file  Social Needs  . Financial resource strain: Not on file  . Food insecurity:    Worry: Not on file    Inability: Not on file  . Transportation needs:    Medical: Not on file    Non-medical: Not on file  Tobacco Use  . Smoking status: Former Smoker    Packs/day: 1.00    Years: 20.00    Pack years: 20.00    Last attempt to quit: 03/17/1992    Years since quitting: 25.8  . Smokeless tobacco: Never Used  . Tobacco comment: quit smoking many years ago .  quit 1994  Substance and Sexual Activity  . Alcohol use: No    Comment: unable to get alcohol per family not since September  . Drug use: No  . Sexual activity: Never  Lifestyle  . Physical activity:    Days per week: Not on file    Minutes per session: Not on file  . Stress: Not on file  Relationships  . Social connections:    Talks on phone: Not on file    Gets together: Not on file    Attends religious service: Not on file    Active member of club or organization: Not on file    Attends meetings of clubs or organizations: Not on file    Relationship status: Not on file  . Intimate partner violence:    Fear of current or ex partner: Not on file     Emotionally abused: Not on file    Physically abused: Not on file    Forced sexual activity: Not on file  Other Topics Concern  . Not on file  Social History Narrative   Lives alone with 3 cats -    Mainly sit at home by herself - rare interactions - lives in the same apartment complex as her daughter   - daughter - paramedic    Review of Systems: Positive for: GI: Described in detail in HPI.    Gen: Denies any fever, chills, rigors, night sweats, anorexia, fatigue, weakness, malaise, involuntary weight loss, and sleep disorder CV: Denies chest pain, angina, palpitations, syncope, orthopnea, PND, peripheral edema, and claudication. Resp: Denies dyspnea, cough, sputum, wheezing, coughing up blood. GU : Denies urinary burning, blood in urine, urinary frequency, urinary hesitancy, nocturnal urination, and urinary incontinence. MS: Denies joint pain or swelling.  Denies muscle weakness, cramps, atrophy.  Derm: Denies rash, itching, oral ulcerations, hives, unhealing ulcers.  Psych: Denies depression, anxiety, memory loss, suicidal ideation, hallucinations,  and confusion. Heme: Denies bruising, bleeding, and enlarged lymph nodes. Neuro:  Denies any headaches, dizziness, paresthesias. Endo:  Denies any problems with DM, thyroid, adrenal function.  Physical Exam: Vital signs in last 24 hours: Temp:  [97.7 F (36.5 C)-98.2 F (36.8 C)] 98 F (36.7 C) (11/09 0806) Pulse Rate:  [103-127] 103 (11/09 0806) Resp:  [14-24] 18 (11/09 0806) BP: (99-133)/(67-94) 105/74 (11/09 0806) SpO2:  [95 %-100 %] 97 % (11/09 0806) Weight:  [71 kg-71.9 kg] 71.9 kg (11/09 0619) Last BM Date: 01/23/18  General:   Alert,  Well-developed, well-nourished, pleasant and cooperative in NAD Head:  Normocephalic and atraumatic. Eyes:  Sclera clear, no icterus.   Conjunctiva pink. Ears:  Normal auditory acuity. Nose:  No deformity, discharge,  or lesions. Mouth:  No deformity or lesions.  Oropharynx pink &  moist. Neck:  Supple; no masses or thyromegaly. Lungs:  Clear throughout to auscultation.   No wheezes, crackles,  or rhonchi. No acute distress. Heart:  Regular rate and rhythm; no murmurs, clicks, rubs,  or gallops. Extremities:  Without clubbing or edema. Neurologic:  Alert and  oriented x4;  grossly normal neurologically. Skin:  Intact without significant lesions or rashes. Psych:  Alert and cooperative. Normal mood and affect. Abdomen:  Soft, nontender and nondistended. No masses, hepatosplenomegaly or hernias noted. Normal bowel sounds, without guarding, and without rebound.         Lab Results: Recent Labs    01/23/18 0153 01/23/18 0524  WBC 16.5* 11.6*  HGB 12.0 9.8*  HCT 38.1 31.6*  PLT 255 203   BMET Recent Labs    01/23/18 0153  NA 134*  K 5.3*  CL 100  CO2 26  GLUCOSE 171*  BUN 30*  CREATININE 0.84  CALCIUM 8.9   LFT Recent Labs    01/23/18 0153  PROT 6.1*  ALBUMIN 3.3*  AST 25  ALT 27  ALKPHOS 67  BILITOT 0.5   PT/INR Recent Labs    01/23/18 0153  LABPROT 15.2  INR 1.21    Studies/Results: Ct Angio Chest Pe W Or Wo Contrast  Result Date: 01/23/2018 CLINICAL DATA:  Acute onset of hematemesis. Recent bilateral massive pulmonary embolus. Reassess pulmonary emboli to determine need for anticoagulation. EXAM: CT ANGIOGRAPHY CHEST WITH CONTRAST TECHNIQUE: Multidetector CT imaging of the chest was performed using the standard protocol during bolus administration of intravenous contrast. Multiplanar CT image reconstructions and MIPs were obtained to evaluate the vascular anatomy. CONTRAST:  175mL ISOVUE-370 IOPAMIDOL (ISOVUE-370) INJECTION 76% COMPARISON:  CTA of the chest performed 01/04/2018 FINDINGS: Cardiovascular: Previously noted bilateral pulmonary emboli have largely resolved, with minimal residual chronic nonobstructive embolus noted at the pulmonary artery to the left lower lobe. The heart is normal in size. Scattered coronary artery  calcifications are seen. Mild calcification is noted along the thoracic aorta and proximal great vessels. Mediastinum/Nodes: The mediastinum is otherwise unremarkable. No mediastinal lymphadenopathy is seen. No pericardial effusion is identified. A 1.6 cm hypodensity is noted at the right thyroid lobe. No axillary lymphadenopathy is appreciated. Lungs/Pleura: There has been interval evolution of the small pulmonary infarct at the right lower lobe. Minimal bilateral atelectasis is noted. No pleural effusion or pneumothorax is seen. No masses are identified. Upper Abdomen: Scattered cystic foci are noted throughout the liver. The spleen is unremarkable. The gallbladder is grossly unremarkable. The visualized portions of the pancreas, adrenal glands and kidneys are within normal limits. Musculoskeletal: No acute osseous abnormalities are identified. There is chronic endplate degenerative change at the upper lumbar spine. The visualized musculature is unremarkable in appearance. Review of the MIP images confirms the above findings. IMPRESSION: 1. Previously noted bilateral pulmonary emboli have largely resolved, with minimal residual chronic nonobstructive embolus noted at the pulmonary artery to the left lower lobe. 2. Interval evolution of small pulmonary infarct at the right lower lung lobe. 3. Scattered coronary artery calcifications seen. 4. 1.6 cm hypodensity at the right thyroid lobe. Consider further evaluation with thyroid ultrasound. If patient is clinically hyperthyroid, consider nuclear medicine thyroid uptake and scan. 5. Scattered cystic foci throughout the liver. Electronically Signed   By: Garald Balding M.D.   On: 01/23/2018 06:11    Impression: Coffee-ground emesis, recently started on Eliquis Hemoglobin dropped from 12-9.8, elevated BUN/creatinine ratio 30/0.84 on admission Last dose of Eliquis yesterday  Plan: EGD in a.m. Okay to start clear liquid diet and nothing by mouth post  midnight On Protonix drip, hemodynamically stable  LOS: 0 days   Ronnette Juniper, M.D.  01/23/2018, 9:00 AM  Pager [phone] If no answer or after 5 PM call [phone]

## 2018-01-23 NOTE — ED Notes (Signed)
Patient transported to CT 

## 2018-01-23 NOTE — Progress Notes (Signed)
PROGRESS NOTE                                                                                                                                                                                                             Patient Demographics:    Denise Kelly, is a 77 y.o. female, DOB - [birth year], VOZ:[id number]  Admit date - 01/23/2018   Admitting Physician Shela Leff, MD  Outpatient Primary MD for the patient is [name], MD  LOS - 0   Chief Complaint  Patient presents with  . GI Bleeding  . Hematemesis       Brief Narrative    77 y.o. female with medical history significant of bipolar disorder, alcohol dependence , with recent hospitalization secondary to acute DVT/PE, started on Eliquis presenting to the hospital from her ALF for evaluation of coffee-ground emesis.   Hemoglobin is stable on admission, she was admitted for further work-up   Subjective:    Denise Kelly today has, No headache, No chest pain, No abdominal pain -no further vomiting since admission .   Assessment  & Plan :    Principal Problem:   Upper GI bleed Active Problems:   Alcohol dependence (HCC)   Acute pulmonary embolus (HCC)   Leukocytosis   Physical deconditioning   Coffee-ground emesis -Patient for upper GI bleed, patient is on Eliquis, she denies any NSAIDs use, hemoglobin was 13 on discharge recently, it is 10.8 this morning, continue with IV Protonix, GI input greatly appreciated, plan to keep on clear liquid diet today, n.p.o. after midnight for scope in the morning. - FOBT positive.   Leukocytosis - Afebrile. White count 16.5.   Toxic appearing, no clear source of infection, most likely related to vomiting, resolved  Recent bilateral submassive PE, bilateral lower extremity DVT  - Patient was recently admitted from October 21 to October 24 for acute bilateral submassive PE with right heart strain, bilateral lower extremity DVT, and pulmonary  infarct.   CTA chest done during hospital stay significant for largely resolved PE. -Eliquis has been on hold, last dose was yesterday, given its acute DVT/PE, stable hemoglobin, and so far no evidence of recurrent coffee-ground emesis, will start on heparin GTT, with no bolus, to be stopped at midnight in anticipation for endoscopy tomorrow.  1.6 cm hypodensity at the right thyroid lobe.  -  Further work-up as an outpatient, per radiology recommendation consider further evaluation with thyroid ultrasound. If patient is clinically hyperthyroid, consider nuclear medicine thyroid uptake and scan.  Alcohol dependence -CIWA protocol; Ativan PRN  Physical deconditioning -[facility] and subacute rehab, likely will need to go back to continue her rehab PT evaluation    Code Status : DNR  Family Communication  : none at [place]  Disposition Plan  : Home when stable  Consults  :  GI  Procedures  : none  DVT Prophylaxis  :  Heparin GTT  Lab Results  Component Value Date   PLT 157 01/23/2018    Antibiotics  :    Anti-infectives (From admission, onward)   None        Objective:   Vitals:   01/23/18 0530 01/23/18 0545 01/23/18 0619 01/23/18 0806  BP: 102/67  133/75 105/74  Pulse: (!) 106 (!) 105 (!) 106 (!) 103  Resp: 20 16 (!) 21 18  Temp:   98.2 F (36.8 C) 98 F (36.7 C)  TempSrc:   Oral Oral  SpO2: 98% 100% 100% 97%  Weight:   71.9 kg   Height:   5\' 4"  (1.626 m)     Wt Readings from Last 3 Encounters:  01/23/18 71.9 kg  01/07/18 70.8 kg  10/02/17 74.4 kg     Intake/Output Summary (Last 24 hours) at 01/23/2018 1254 Last data filed at 01/23/2018 0900 Gross per 24 hour  Intake 1100 ml  Output 700 ml  Net 400 ml     Physical Exam  Awake Alert, Oriented X 3, No new F.N deficits, Normal affect Symmetrical Chest wall movement, Good air movement bilaterally, CTAB RRR,No Gallops,Rubs or new Murmurs, No Parasternal Heave +ve B.Sounds, Abd Soft, No tenderness,No  rebound - guarding or rigidity. No Cyanosis, Clubbing or edema, No new Rash or bruise      Data Review:    CBC Recent Labs  Lab 01/23/18 0153 01/23/18 0524 01/23/18 0850  WBC 16.5* 11.6* 9.2  HGB 12.0 9.8* 10.8*  HCT 38.1 31.6* 35.5*  PLT 255 203 157  MCV 82.3 83.6 87.4  MCH 25.9* 25.9* 26.6  MCHC 31.5 31.0 30.4  RDW 20.2* 20.4* 21.0*  LYMPHSABS 3.8  --   --   MONOABS 1.0  --   --   EOSABS 0.1  --   --   BASOSABS 0.1  --   --     Chemistries  Recent Labs  Lab 01/23/18 0153  NA 134*  K 5.3*  CL 100  CO2 26  GLUCOSE 171*  BUN 30*  CREATININE 0.84  CALCIUM 8.9  AST 25  ALT 27  ALKPHOS 67  BILITOT 0.5   ------------------------------------------------------------------------------------------------------------------ No results for input(s): CHOL, HDL, LDLCALC, TRIG, CHOLHDL, LDLDIRECT in the last 72 hours.  Lab Results  Component Value Date   HGBA1C 5.2 05/26/2016   ------------------------------------------------------------------------------------------------------------------ No results for input(s): TSH, T4TOTAL, T3FREE, THYROIDAB in the last 72 hours.  Invalid input(s): FREET3 ------------------------------------------------------------------------------------------------------------------ No results for input(s): VITAMINB12, FOLATE, FERRITIN, TIBC, IRON, RETICCTPCT in the last 72 hours.  Coagulation profile Recent Labs  Lab 01/23/18 0153  INR 1.21    No results for input(s): DDIMER in the last 72 hours.  Cardiac Enzymes No results for input(s): CKMB, TROPONINI, MYOGLOBIN in the last 168 hours.  Invalid input(s): CK ------------------------------------------------------------------------------------------------------------------    Component Value Date/Time   BNP 80.1 05/23/2016 2032    Inpatient Medications  Scheduled Meds: . iopamidol      . [  START ON 01/26/2018] pantoprazole  40 mg Intravenous Q12H   Continuous Infusions: .  sodium chloride 75 mL/hr at 01/23/18 0845  . sodium chloride    . pantoprozole (PROTONIX) infusion 8 mg/hr (01/23/18 0536)   PRN Meds:.LORazepam, ondansetron  Micro Results No results found for this or any previous visit (from the past 240 hour(s)).  Radiology Reports Dg Chest 2 View  Result Date: 01/04/2018 CLINICAL DATA:  Cough EXAM: CHEST - 2 VIEW COMPARISON:  Chest x-ray dated 09/28/2017. FINDINGS: New subtle rounded opacity within the RIGHT mid lung region, only appreciable on the AP view. LEFT lung is clear. No pleural effusion or pneumothorax seen. Heart size and mediastinal contours are stable. IMPRESSION: New subtle rounded opacity within the RIGHT mid lung region, suspicious for early developing pneumonia. Neoplastic process is not excluded. Consider chest CT for further characterization. At minimum, recommend follow-up chest x-ray to ensure resolution. Electronically Signed   By: Franki Cabot M.D.   On: 01/04/2018 17:56   Ct Angio Chest Pe W Or Wo Contrast  Result Date: 01/23/2018 CLINICAL DATA:  Acute onset of hematemesis. Recent bilateral massive pulmonary embolus. Reassess pulmonary emboli to determine need for anticoagulation. EXAM: CT ANGIOGRAPHY CHEST WITH CONTRAST TECHNIQUE: Multidetector CT imaging of the chest was performed using the standard protocol during bolus administration of intravenous contrast. Multiplanar CT image reconstructions and MIPs were obtained to evaluate the vascular anatomy. CONTRAST:  135mL ISOVUE-370 IOPAMIDOL (ISOVUE-370) INJECTION 76% COMPARISON:  CTA of the chest performed 01/04/2018 FINDINGS: Cardiovascular: Previously noted bilateral pulmonary emboli have largely resolved, with minimal residual chronic nonobstructive embolus noted at the pulmonary artery to the left lower lobe. The heart is normal in size. Scattered coronary artery calcifications are seen. Mild calcification is noted along the thoracic aorta and proximal great vessels.  Mediastinum/Nodes: The mediastinum is otherwise unremarkable. No mediastinal lymphadenopathy is seen. No pericardial effusion is identified. A 1.6 cm hypodensity is noted at the right thyroid lobe. No axillary lymphadenopathy is appreciated. Lungs/Pleura: There has been interval evolution of the small pulmonary infarct at the right lower lobe. Minimal bilateral atelectasis is noted. No pleural effusion or pneumothorax is seen. No masses are identified. Upper Abdomen: Scattered cystic foci are noted throughout the liver. The spleen is unremarkable. The gallbladder is grossly unremarkable. The visualized portions of the pancreas, adrenal glands and kidneys are within normal limits. Musculoskeletal: No acute osseous abnormalities are identified. There is chronic endplate degenerative change at the upper lumbar spine. The visualized musculature is unremarkable in appearance. Review of the MIP images confirms the above findings. IMPRESSION: 1. Previously noted bilateral pulmonary emboli have largely resolved, with minimal residual chronic nonobstructive embolus noted at the pulmonary artery to the left lower lobe. 2. Interval evolution of small pulmonary infarct at the right lower lung lobe. 3. Scattered coronary artery calcifications seen. 4. 1.6 cm hypodensity at the right thyroid lobe. Consider further evaluation with thyroid ultrasound. If patient is clinically hyperthyroid, consider nuclear medicine thyroid uptake and scan. 5. Scattered cystic foci throughout the liver. Electronically Signed   By: Garald Balding M.D.   On: 01/23/2018 06:11   Ct Angio Chest Pe W And/or Wo Contrast  Result Date: 01/04/2018 CLINICAL DATA:  77 y/o F; generalized weakness. PE suspected, intermediate prob, positive D-dimer. EXAM: CT ANGIOGRAPHY CHEST WITH CONTRAST TECHNIQUE: Multidetector CT imaging of the chest was performed using the standard protocol during bolus administration of intravenous contrast. Multiplanar CT image  reconstructions and MIPs were obtained to evaluate the vascular anatomy. CONTRAST:  60 cc Isovue 370 COMPARISON:  02/25/2016 CT angiogram of the chest. FINDINGS: Cardiovascular: Satisfactory opacification of the pulmonary arteries to the segmental level. Bilateral pulmonary emboli in the right main and bilateral segmental pulmonary arteries. No saddle embolus. RV/LV = 1.1. Stable mild cardiomegaly. No pericardial effusion. Mild coronary artery calcific atherosclerosis. Normal caliber main pulmonary artery and aorta. Mild aortic atherosclerosis. Mediastinum/Nodes: No enlarged mediastinal, hilar, or axillary lymph nodes. Thyroid gland, trachea, and esophagus demonstrate no significant findings. Lungs/Pleura: Centrally lucent wedge-shaped consolidation within the right lower lobe likely representing a pulmonary infarct. No additional consolidation, effusion, or pneumothorax. Upper Abdomen: Multiple small liver hypodensities likely representing cysts, a few are too small to characterize, stable from prior study. Musculoskeletal: No acute fracture. Mild-to-moderate discogenic degenerative changes of the spine. Review of the MIP images confirms the above findings. IMPRESSION: 1. Positive for acute PE with CTevidence of right heart strain (RV/LV Ratio = 1.1) consistent with at least submassive (intermediate risk) PE. The presence of right heart strain has been associated with an increased risk of morbidity and mortality. Please activate Code PE by paging [phone]. 2. Right lower lobe wedge-shaped consolidation likely representing a pulmonary infarct. 3. Mild coronary and aortic calcific atherosclerosis. Critical Value/emergent results were called by telephone at the time of interpretation on 01/04/2018 at 7:41 pm to Dr. Blanchie Dessert , who verbally acknowledged these results. Electronically Signed   By: Kristine Garbe M.D.   On: 01/04/2018 19:42   Ct Abdomen Pelvis W Contrast  Result Date:  12/24/2017 CLINICAL DATA:  UTI.  Abdominal pain. EXAM: CT ABDOMEN AND PELVIS WITH CONTRAST TECHNIQUE: Multidetector CT imaging of the abdomen and pelvis was performed using the standard protocol following bolus administration of intravenous contrast. CONTRAST:  177mL ISOVUE-300 IOPAMIDOL (ISOVUE-300) INJECTION 61% COMPARISON:  09/28/2017 FINDINGS: Lower chest: Mild cardiomegaly.  Bibasilar atelectasis. Hepatobiliary: Scattered hypodensities throughout the liver compatible with small cysts, stable. Gallbladder unremarkable. Pancreas: No focal abnormality or ductal dilatation. Spleen: No focal abnormality.  Normal size. Adrenals/Urinary Tract: No adrenal abnormality. No focal renal abnormality. No stones or hydronephrosis. Urinary bladder is unremarkable. Stomach/Bowel: Descending colonic and sigmoid diverticulosis. No active diverticulitis. Appendix is normal. Stomach and small bowel decompressed, unremarkable. Vascular/Lymphatic: Aortic atherosclerosis. No enlarged abdominal or pelvic lymph nodes. Reproductive: Uterus and adnexa unremarkable.  No mass. Other: No free fluid or free air. Musculoskeletal: Hardware noted in the left proximal femur from remote injury. No acute bony abnormality. Posterior fusion changes in the lower lumbar spine. IMPRESSION: Left colonic diverticulosis.  No active diverticulitis. No acute findings in the abdomen or pelvis. Bibasilar atelectasis. Mild cardiomegaly. Electronically Signed   By: Rolm Baptise M.D.   On: 12/24/2017 17:34   Vas [place] Lower Extremity Venous (dvt)  Result Date: 01/06/2018  Lower Venous Study Indications: Pain.  Limitations: Very sensitive for transducer, patient has very limited tolerance to compression. Performing Technologist: Lorina Rabon  Examination Guidelines: A complete evaluation includes B-mode imaging, spectral Doppler, color Doppler, and power Doppler as needed of all accessible portions of each vessel. Bilateral testing is considered an integral  part of a complete examination. Limited examinations for reoccurring indications may be performed as noted.  Right Venous Findings: +---------+---------------+---------+-----------+----------+---------+          CompressibilityPhasicitySpontaneityPropertiesSummary   +---------+---------------+---------+-----------+----------+---------+ CFV      Full           Yes      Yes  poor flow +---------+---------------+---------+-----------+----------+---------+ SFJ      Full                                                   +---------+---------------+---------+-----------+----------+---------+ FV Prox  Partial                                      Acute     +---------+---------------+---------+-----------+----------+---------+ FV Mid   Full                                                   +---------+---------------+---------+-----------+----------+---------+ FV DistalFull           Yes      Yes                            +---------+---------------+---------+-----------+----------+---------+ PFV      Full                                                   +---------+---------------+---------+-----------+----------+---------+ POP      None                                         Acute     +---------+---------------+---------+-----------+----------+---------+  Right Technical Findings: Not visualized segments include calf veins. patient refused to exam calf veins. patient was extremely sensitive for compression, exam cannot approach all segments of calf veins. Could not exclude possible calf veins thrombosis existence.  Left Venous Findings: +---------+---------------+---------+-----------+----------------------+-------+          CompressibilityPhasicitySpontaneityProperties            Summary +---------+---------------+---------+-----------+----------------------+-------+ CFV      Partial                                                  Acute    +---------+---------------+---------+-----------+----------------------+-------+ SFJ      Partial                                                  Acute   +---------+---------------+---------+-----------+----------------------+-------+ FV Prox  Partial                            partially             Acute  re-cannalized                 +---------+---------------+---------+-----------+----------------------+-------+ FV Mid   Partial                                                  Acute   +---------+---------------+---------+-----------+----------------------+-------+ FV DistalPartial                                                          +---------+---------------+---------+-----------+----------------------+-------+ POP      Partial        No       No                               Acute   +---------+---------------+---------+-----------+----------------------+-------+  Left Technical Findings: Not visualized segments include calf veins. patient was extremely sensitive for compression, exam cannot approach all segments of calf veins. Could not exclude possible calf veins thrombosis existence.   Summary: Right: Findings consistent with acute deep vein thrombosis involving the right femoral vein, and right popliteal vein. Left: Findings consistent with acute deep vein thrombosis involving the left common femoral vein, left femoral vein, and left popliteal vein.  *See table(s) above for measurements and observations. Electronically signed by Curt Jews MD on 01/06/2018 at 7:12:08 PM.    Final       Phillips Climes M.D on 01/23/2018 at 12:54 PM  Between 7am to 7pm - Pager - [phone]  After 7pm go to [email or web address] - password [facility]  [facility] -  Office  [phone]

## 2018-01-23 NOTE — Consult Note (Signed)
[facility] Consult  Referring Provider: ER/Pollina, Gwenyth Allegra, MD Primary Care Physician:  [name], MD Primary Gastroenterologist: Dr.Outlaw/[facility]  Reason for Consultation:  coffee Ground emesis  HPI: Denise Kelly is a 77 y.o. female was admitted yesterday with 2 episodes of coffee-ground emesis. Patient reports nausea prior to such episode and describes the vomitus as dark brown in color. She denies noticing black stool or bloody bowel movement. She reports having an endoscopy over 10 years ago in [place]. She denies difficulty swallowing, pain on swallowing, acid reflux or heartburn. She denies unintentional weight loss or loss of appetite. She was recently started on Eliquis after developing acute bilateral submassive PE along with bilateral lower extremity DVT and pulmonary infarct. Patient reports heavy alcohol use in the past, but has not had alcohol for several years. She reports having a colonoscopy about 4 years ago and had removal of polyps and will be due for a surveillance colonoscopy soon. She also takes aspirin 81 mg daily.   Past Medical History:  Diagnosis Date  . Alcohol abuse    ETOH and xanax  . Anxiety    Panic attacks   . Arthritis   . Cancer ([facility])    skin- basal , squamous , melonoma- right hand  . Cellulitis    right leg  . Complication of anesthesia 2009   "felt drunk for a week after"- AVM- both times- felt drunk  . Depression   . Encephalopathy   . GERD (gastroesophageal reflux disease)   . HOH (hard of hearing)   . HOH (hard of hearing)   . Hypertension    not on mediacations  . Palpitations   . PONV (postoperative nausea and vomiting)   . Spondylolisthesis of lumbosacral region   . UTI (urinary tract infection)    frequently    Past Surgical History:  Procedure Laterality Date  . BREAST SURGERY Right    2 breast biopsies  . 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 COLONOSCOPY  2011   polyps  . EYE SURGERY  Bilateral    cataracts  . INTRAMEDULLARY (IM) NAIL INTERTROCHANTERIC Left 11/29/2015   Procedure: LEFT HIP   NAIL;  Surgeon: Renette Butters, MD;  Location: [facility];  Service: Orthopedics;  Laterality: Left;  . JOINT REPLACEMENT Left 09/2009   knee  . LUMBAR FUSION  07/17/2016   Lumbar four-five Posterior lumbar interbody fusion (N/A)  . TONSILLECTOMY    . TOTAL KNEE ARTHROPLASTY Right 01/02/2014   Procedure: TOTAL KNEE ARTHROPLASTY;  Surgeon: [name], MD;  Location: [facility];  Service: Orthopedics;  Laterality: Right;  . TUBAL LIGATION      Prior to Admission medications   Medication Sig Start Date End Date Taking? Authorizing Provider  acetaminophen (TYLENOL) 325 MG tablet Take 650 mg by mouth every 4 (four) hours as needed for mild pain.   Yes [provider]  acidophilus (RISAQUAD) CAPS capsule Take 2 capsules by mouth daily. 05/29/16  Yes Elgergawy, Silver Huguenin, MD  apixaban (ELIQUIS) 5 MG TABS tablet Take 1 tablet (5 mg total) by mouth 2 (two) times daily. 01/13/18 02/12/18 Yes Elodia Florence., MD  aspirin EC 81 MG tablet Take 81 mg by mouth at bedtime.    Yes [provider]  citalopram (CELEXA) 10 MG tablet Take 10 mg by mouth daily.   Yes [provider]  estradiol (ESTRACE) 0.1 MG/GM vaginal cream Place 1 Applicatorful vaginally 2 (two) times a week.  Yes [provider]  gabapentin (NEURONTIN) 100 MG capsule Take 2 capsules (200 mg total) by mouth 2 (two) times daily. 10/03/17  Yes Nita Sells, MD  hydrOXYzine (ATARAX/VISTARIL) 25 MG tablet Take 25 mg by mouth at bedtime.    Yes [provider]  loperamide (IMODIUM) 2 MG capsule Take 1 capsule (2 mg total) by mouth 2 (two) times daily as needed for diarrhea or loose stools. 10/03/17  Yes Nita Sells, MD  loratadine (CLARITIN) 10 MG tablet Take 1 tablet (10 mg total) by mouth daily. 01/08/18 02/07/18 Yes Elodia Florence., MD  Multiple Vitamin (MULTIVITAMIN WITH  MINERALS) TABS tablet Take 1 tablet by mouth daily. 01/08/18 02/07/18 Yes Elodia Florence., MD  ondansetron (ZOFRAN-ODT) 4 MG disintegrating tablet Take 4 mg by mouth every 8 (eight) hours as needed for nausea.  12/21/17  Yes [provider]  polyethylene glycol (MIRALAX / GLYCOLAX) packet Take 17 g by mouth daily.   Yes [provider]  rOPINIRole (REQUIP) 0.5 MG tablet Take 0.5 mg by mouth at bedtime.   Yes [provider]  senna-docusate (SENOKOT-S) 8.6-50 MG tablet Take 1 tablet by mouth daily as needed for mild constipation.   Yes [provider]  spironolactone (ALDACTONE) 50 MG tablet Take 50 mg by mouth 2 (two) times daily.  09/16/17  Yes [provider]  traZODone (DESYREL) 50 MG tablet Take 1 tablet (50 mg total) by mouth daily as needed for sleep. Patient taking differently: Take 50 mg by mouth at bedtime.  10/03/17  Yes Nita Sells, MD  Zinc Oxide (TRIPLE PASTE) 12.8 % ointment Apply topically as needed for irritation. Patient taking differently: Apply 1 application topically daily as needed for irritation.  10/03/17  Yes Nita Sells, MD    Current Facility-Administered Medications  Medication Dose Route Frequency Provider Last Rate Last Dose  . 0.9 %  sodium chloride infusion   Intravenous Continuous Elgergawy, Silver Huguenin, MD 75 mL/hr at 01/23/18 0845    . iopamidol (ISOVUE-370) 76 % injection           . LORazepam (ATIVAN) injection 2-3 mg  2-3 mg Intravenous Q1H PRN Shela Leff, MD      . ondansetron (ZOFRAN) injection 4 mg  4 mg Intravenous Q6H PRN Shela Leff, MD      . pantoprazole (PROTONIX) 80 mg in sodium chloride 0.9 % 250 mL (0.32 mg/mL) infusion  8 mg/hr Intravenous Continuous Shela Leff, MD 25 mL/hr at 01/23/18 0536 8 mg/hr at 01/23/18 0536  . [START ON 01/26/2018] pantoprazole (PROTONIX) injection 40 mg  40 mg Intravenous Q12H Shela Leff, MD        Allergies as of 01/23/2018 -  Review Complete 01/23/2018  Allergen Reaction Noted  . Sulfa antibiotics Hives, Swelling, and Other (See Comments) 02/04/2011  . Coreg [carvedilol] Other (See Comments) 07/02/2016  . Motrin [ibuprofen] Palpitations 01/17/2014  . Toprol xl [metoprolol tartrate] Cough 07/02/2016  . Doxycycline hyclate Other (See Comments) 07/02/2016  . Flovent hfa [fluticasone] Itching 07/02/2016  . Statins Other (See Comments) 07/02/2016    Family History  Problem Relation Age of Onset  . Hypertension Other   . Cancer Mother   . Cancer Father     Social History   Socioeconomic History  . Marital status: Divorced    Spouse name: Not on file  . Number of children: Not on file  . Years of education: Not on file  . Highest education level: Not on file  Occupational  History  . Not on file  Social Needs  . Financial resource strain: Not on file  . Food insecurity:    Worry: Not on file    Inability: Not on file  . Transportation needs:    Medical: Not on file    Non-medical: Not on file  Tobacco Use  . Smoking status: Former Smoker    Packs/day: 1.00    Years: 20.00    Pack years: 20.00    Last attempt to quit: 03/17/1992    Years since quitting: 25.8  . Smokeless tobacco: Never Used  . Tobacco comment: quit smoking many years ago .  quit 1994  Substance and Sexual Activity  . Alcohol use: No    Comment: unable to get alcohol per family not since September  . Drug use: No  . Sexual activity: Never  Lifestyle  . Physical activity:    Days per week: Not on file    Minutes per session: Not on file  . Stress: Not on file  Relationships  . Social connections:    Talks on phone: Not on file    Gets together: Not on file    Attends religious service: Not on file    Active member of club or organization: Not on file    Attends meetings of clubs or organizations: Not on file    Relationship status: Not on file  . Intimate partner violence:    Fear of current or ex partner: Not on file     Emotionally abused: Not on file    Physically abused: Not on file    Forced sexual activity: Not on file  Other Topics Concern  . Not on file  Social History Narrative   Lives alone with 3 cats -    Mainly sit at home by herself - rare interactions - lives in the same apartment complex as her daughter   - daughter - paramedic    Review of Systems: Positive for: GI: Described in detail in HPI.    Gen: Denies any fever, chills, rigors, night sweats, anorexia, fatigue, weakness, malaise, involuntary weight loss, and sleep disorder CV: Denies chest pain, angina, palpitations, syncope, orthopnea, PND, peripheral edema, and claudication. Resp: Denies dyspnea, cough, sputum, wheezing, coughing up blood. GU : Denies urinary burning, blood in urine, urinary frequency, urinary hesitancy, nocturnal urination, and urinary incontinence. MS: Denies joint pain or swelling.  Denies muscle weakness, cramps, atrophy.  Derm: Denies rash, itching, oral ulcerations, hives, unhealing ulcers.  Psych: Denies depression, anxiety, memory loss, suicidal ideation, hallucinations,  and confusion. Heme: Denies bruising, bleeding, and enlarged lymph nodes. Neuro:  Denies any headaches, dizziness, paresthesias. Endo:  Denies any problems with DM, thyroid, adrenal function.  Physical Exam: Vital signs in last 24 hours: Temp:  [97.7 F (36.5 C)-98.2 F (36.8 C)] 98 F (36.7 C) (11/09 0806) Pulse Rate:  [103-127] 103 (11/09 0806) Resp:  [14-24] 18 (11/09 0806) BP: (99-133)/(67-94) 105/74 (11/09 0806) SpO2:  [95 %-100 %] 97 % (11/09 0806) Weight:  [71 kg-71.9 kg] 71.9 kg (11/09 0619) Last BM Date: 01/23/18  General:   Alert,  Well-developed, well-nourished, pleasant and cooperative in NAD Head:  Normocephalic and atraumatic. Eyes:  Sclera clear, no icterus.   Conjunctiva pink. Ears:  Normal auditory acuity. Nose:  No deformity, discharge,  or lesions. Mouth:  No deformity or lesions.  Oropharynx pink &  moist. Neck:  Supple; no masses or thyromegaly. Lungs:  Clear throughout to auscultation.   No wheezes, crackles,  or rhonchi. No acute distress. Heart:  Regular rate and rhythm; no murmurs, clicks, rubs,  or gallops. Extremities:  Without clubbing or edema. Neurologic:  Alert and  oriented x4;  grossly normal neurologically. Skin:  Intact without significant lesions or rashes. Psych:  Alert and cooperative. Normal mood and affect. Abdomen:  Soft, nontender and nondistended. No masses, hepatosplenomegaly or hernias noted. Normal bowel sounds, without guarding, and without rebound.         Lab Results: Recent Labs    01/23/18 0153 01/23/18 0524  WBC 16.5* 11.6*  HGB 12.0 9.8*  HCT 38.1 31.6*  PLT 255 203   BMET Recent Labs    01/23/18 0153  NA 134*  K 5.3*  CL 100  CO2 26  GLUCOSE 171*  BUN 30*  CREATININE 0.84  CALCIUM 8.9   LFT Recent Labs    01/23/18 0153  PROT 6.1*  ALBUMIN 3.3*  AST 25  ALT 27  ALKPHOS 67  BILITOT 0.5   PT/INR Recent Labs    01/23/18 0153  LABPROT 15.2  INR 1.21    Studies/Results: Ct Angio Chest Pe W Or Wo Contrast  Result Date: 01/23/2018 CLINICAL DATA:  Acute onset of hematemesis. Recent bilateral massive pulmonary embolus. Reassess pulmonary emboli to determine need for anticoagulation. EXAM: CT ANGIOGRAPHY CHEST WITH CONTRAST TECHNIQUE: Multidetector CT imaging of the chest was performed using the standard protocol during bolus administration of intravenous contrast. Multiplanar CT image reconstructions and MIPs were obtained to evaluate the vascular anatomy. CONTRAST:  131mL ISOVUE-370 IOPAMIDOL (ISOVUE-370) INJECTION 76% COMPARISON:  CTA of the chest performed 01/04/2018 FINDINGS: Cardiovascular: Previously noted bilateral pulmonary emboli have largely resolved, with minimal residual chronic nonobstructive embolus noted at the pulmonary artery to the left lower lobe. The heart is normal in size. Scattered coronary artery  calcifications are seen. Mild calcification is noted along the thoracic aorta and proximal great vessels. Mediastinum/Nodes: The mediastinum is otherwise unremarkable. No mediastinal lymphadenopathy is seen. No pericardial effusion is identified. A 1.6 cm hypodensity is noted at the right thyroid lobe. No axillary lymphadenopathy is appreciated. Lungs/Pleura: There has been interval evolution of the small pulmonary infarct at the right lower lobe. Minimal bilateral atelectasis is noted. No pleural effusion or pneumothorax is seen. No masses are identified. Upper Abdomen: Scattered cystic foci are noted throughout the liver. The spleen is unremarkable. The gallbladder is grossly unremarkable. The visualized portions of the pancreas, adrenal glands and kidneys are within normal limits. Musculoskeletal: No acute osseous abnormalities are identified. There is chronic endplate degenerative change at the upper lumbar spine. The visualized musculature is unremarkable in appearance. Review of the MIP images confirms the above findings. IMPRESSION: 1. Previously noted bilateral pulmonary emboli have largely resolved, with minimal residual chronic nonobstructive embolus noted at the pulmonary artery to the left lower lobe. 2. Interval evolution of small pulmonary infarct at the right lower lung lobe. 3. Scattered coronary artery calcifications seen. 4. 1.6 cm hypodensity at the right thyroid lobe. Consider further evaluation with thyroid ultrasound. If patient is clinically hyperthyroid, consider nuclear medicine thyroid uptake and scan. 5. Scattered cystic foci throughout the liver. Electronically Signed   By: Garald Balding M.D.   On: 01/23/2018 06:11    Impression: Coffee-ground emesis, recently started on Eliquis Hemoglobin dropped from 12-9.8, elevated BUN/creatinine ratio 30/0.84 on admission Last dose of Eliquis yesterday  Plan: EGD in a.m. Okay to start clear liquid diet and nothing by mouth post  midnight On Protonix drip, hemodynamically stable  LOS: 0 days   Ronnette Juniper, M.D.  01/23/2018, 9:00 AM  Pager [phone] If no answer or after 5 PM call [phone]

## 2018-01-23 NOTE — Progress Notes (Addendum)
ANTICOAGULATION CONSULT NOTE - Initial Consult  Pharmacy Consult for heparin Indication: pulmonary embolus  Allergies  Allergen Reactions  . Sulfa Antibiotics Hives, Swelling and Other (See Comments)    Reaction:  Facial/eye swelling   . Coreg [Carvedilol] Other (See Comments)    Memory loss  . Motrin [Ibuprofen] Palpitations  . Toprol Xl [Metoprolol Tartrate] Cough  . Doxycycline Hyclate Other (See Comments)    HEARTBURN  . Flovent Hfa [Fluticasone] Itching  . Statins Other (See Comments)    MENTAL STATUS CHANGE    Patient Measurements: Height: 5\' 4"  (162.6 cm) Weight: 158 lb 8.2 oz (71.9 kg) IBW/kg (Calculated) : 54.7 Heparin Dosing Weight: 68 kg  Vital Signs: Temp: 98 F (36.7 C) (11/09 0806) Temp Source: Oral (11/09 0806) BP: 105/74 (11/09 0806) Pulse Rate: 103 (11/09 0806)  Labs: Recent Labs    01/23/18 0153 01/23/18 0524 01/23/18 0850  HGB 12.0 9.8* 10.8*  HCT 38.1 31.6* 35.5*  PLT 255 203 157  APTT 30  --   --   LABPROT 15.2  --   --   INR 1.21  --   --   CREATININE 0.84  --   --     Estimated Creatinine Clearance: 55.4 mL/min (by C-G formula based on SCr of 0.84 mg/dL).   Medical History: Past Medical History:  Diagnosis Date  . Alcohol abuse    ETOH and xanax  . Anxiety    Panic attacks   . Arthritis   . Cancer ([facility])    skin- basal , squamous , melonoma- right hand  . Cellulitis    right leg  . Complication of anesthesia 2009   "felt drunk for a week after"- AVM- both times- felt drunk  . Depression   . Encephalopathy   . GERD (gastroesophageal reflux disease)   . HOH (hard of hearing)   . HOH (hard of hearing)   . Hypertension    not on mediacations  . Palpitations   . PONV (postoperative nausea and vomiting)   . Spondylolisthesis of lumbosacral region   . UTI (urinary tract infection)    frequently     Assessment: 70 yoF recently diagnosed with submassive PE 12/2017 and discharged on apixaban now presenting with coffee ground  emesis. Apixaban has been held (last dose 11/8) and pharmacy consulted to bridge with IV heparin given recency of embolic event. Heparin to stop at midnight for EGD tomorrow morning.  Goal of Therapy:  Heparin level 0.3-0.5 units/ml  APTT 66-85 seconds Monitor platelets by anticoagulation protocol: Yes   Plan:  -Begin heparin 700 units/h with no bolus -Will check 8hr aPTT/heparin level -Stop heparin at midnight  ADDENDUM: Initial aPTT therapeutic at 68 seconds, heparin level remains inprocess and likely elevated due to DOAC exposure. Will continue current drip rate of 700 units/hr until midnight and then D/C as planned.   Arrie Senate, PharmD, BCPS Clinical Pharmacist [phone] Please check AMION for all [facility] numbers 01/23/2018

## 2018-01-23 NOTE — H&P (Addendum)
History and Physical    Denise Kelly:[id number] DOB: [birth year] DOA: 01/23/2018  PCP: [name], MD Patient coming from: ALF  Chief Complaint: Coffee-ground emesis  HPI: Denise Kelly is a 77 y.o. female with medical history significant of bipolar disorder, alcohol dependence presenting to the hospital from her ALF for evaluation of coffee-ground emesis.  Patient reports having 2 episodes of vomiting today.  She describes the vomitus " brown mush."  Denies having any abdominal pain or diarrhea.  States she has been taking Eliquis since her previous hospitalization.  Denies having any chest pain or shortness of breath.  No further history could be obtained from her as patient appeared agitated.  Patient was recently admitted from October 21 to October 24 for acute bilateral submassive PE with right heart strain, bilateral lower extremity DVT, and pulmonary infarct.  Patient was discharged on Eliquis.  ED Course: Afebrile, tachycardic.  Hemoglobin 12.0.  FOBT positive.  White count 16.5.  ED physician discussed the case with on-call GI, patient will be seen in the morning, no further recommendations at this time. CT angio has been ordered to reevaluate previously diagnosed PE.   Review of Systems: As per HPI otherwise 10 point review of systems negative.  Past Medical History:  Diagnosis Date  . Alcohol abuse    ETOH and xanax  . Anxiety    Panic attacks   . Arthritis   . Cancer ([place])    skin- basal , squamous , melonoma- right hand  . Cellulitis    right leg  . Complication of anesthesia 2009   "felt drunk for a week after"- AVM- both times- felt drunk  . Depression   . Encephalopathy   . GERD (gastroesophageal reflux disease)   . HOH (hard of hearing)   . HOH (hard of hearing)   . Hypertension    not on mediacations  . Palpitations   . PONV (postoperative nausea and vomiting)   . Spondylolisthesis of lumbosacral region   . UTI (urinary tract infection)    frequently    Past Surgical History:  Procedure Laterality Date  . BREAST SURGERY Right    2 breast biopsies  . 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 COLONOSCOPY  2011   polyps  . EYE SURGERY Bilateral    cataracts  . INTRAMEDULLARY (IM) NAIL INTERTROCHANTERIC Left 11/29/2015   Procedure: LEFT HIP   NAIL;  Surgeon: Renette Butters, MD;  Location: [facility];  Service: Orthopedics;  Laterality: Left;  . JOINT REPLACEMENT Left 09/2009   knee  . LUMBAR FUSION  07/17/2016   Lumbar four-five Posterior lumbar interbody fusion (N/A)  . TONSILLECTOMY    . TOTAL KNEE ARTHROPLASTY Right 01/02/2014   Procedure: TOTAL KNEE ARTHROPLASTY;  Surgeon: [name], MD;  Location: [facility];  Service: Orthopedics;  Laterality: Right;  . TUBAL LIGATION       reports that she quit smoking about 25 years ago. She has a 20.00 pack-year smoking history. She has never used smokeless tobacco. She reports that she does not drink alcohol or use drugs.  Allergies  Allergen Reactions  . Sulfa Antibiotics Hives, Swelling and Other (See Comments)    Reaction:  Facial/eye swelling   . Coreg [Carvedilol] Other (See Comments)    Memory loss  . Motrin [Ibuprofen] Palpitations  . Toprol Xl [Metoprolol Tartrate] Cough  . Doxycycline Hyclate Other (See Comments)    HEARTBURN  . Flovent Hfa [Fluticasone] Itching  .  Statins Other (See Comments)    MENTAL STATUS CHANGE    Family History  Problem Relation Age of Onset  . Hypertension Other   . Cancer Mother   . Cancer Father     Prior to Admission medications   Medication Sig Start Date End Date Taking? Authorizing Provider  acidophilus (RISAQUAD) CAPS capsule Take 2 capsules by mouth daily. Patient taking differently: Take 2 capsules by mouth 3 (three) times a week.  05/29/16   Elgergawy, Silver Huguenin, MD  apixaban (ELIQUIS) 5 MG TABS tablet Take 2 tablets (10 mg total) by mouth 2 (two) times daily for 6 days. (starting 10/24 PM) 01/07/18 01/13/18  Elodia Florence., MD  apixaban (ELIQUIS) 5 MG TABS tablet Take 1 tablet (5 mg total) by mouth 2 (two) times daily. 01/13/18 02/12/18  Elodia Florence., MD  aspirin EC 81 MG tablet Take 81 mg by mouth at bedtime.     [provider]  diphenhydrAMINE (BENADRYL) 25 mg capsule Take 1 capsule (25 mg total) by mouth every 4 (four) hours as needed for itching. 01/07/18   Elodia Florence., MD  estradiol (ESTRACE) 0.1 MG/GM vaginal cream Place 1 Applicatorful vaginally at bedtime as needed (for vaginal discomfort).    [provider]  gabapentin (NEURONTIN) 100 MG capsule Take 2 capsules (200 mg total) by mouth 2 (two) times daily. Patient not taking: Reported on 01/04/2018 10/03/17   Nita Sells, MD  hydrOXYzine (ATARAX/VISTARIL) 25 MG tablet Take 25 mg by mouth daily as needed for anxiety (and/or sleep).     [provider]  loperamide (IMODIUM) 2 MG capsule Take 1 capsule (2 mg total) by mouth 2 (two) times daily as needed for diarrhea or loose stools. 10/03/17   Nita Sells, MD  loratadine (CLARITIN) 10 MG tablet Take 1 tablet (10 mg total) by mouth daily. 01/08/18 02/07/18  Elodia Florence., MD  Multiple Vitamin (MULTIVITAMIN WITH MINERALS) TABS tablet Take 1 tablet by mouth daily. 01/08/18 02/07/18  Elodia Florence., MD  ondansetron (ZOFRAN-ODT) 4 MG disintegrating tablet Take 4 mg by mouth every 8 (eight) hours as needed. 12/21/17   [provider]  spironolactone (ALDACTONE) 50 MG tablet Take 50 mg by mouth 2 (two) times daily.  09/16/17   [provider]  traZODone (DESYREL) 50 MG tablet Take 1 tablet (50 mg total) by mouth daily as needed for sleep. 10/03/17   Nita Sells, MD  Zinc Oxide (TRIPLE PASTE) 12.8 % ointment Apply topically as needed for irritation. Patient taking differently: Apply 1 application topically daily as needed for irritation.  10/03/17   Nita Sells, MD    Physical Exam: Vitals:     01/23/18 0430 01/23/18 0445 01/23/18 0500 01/23/18 0515  BP: 103/87  107/70   Pulse: (!) 108 (!) 108 (!) 107 (!) 110  Resp: 18 15 19 16   Temp:      SpO2: 97% 97% 96% 99%  Weight:      Height:        Physical Exam  Constitutional: She is oriented to person, place, and time. No distress.  Dark brown dried blood spots noted on her clothing.   HENT:  Head: Normocephalic.  Mouth/Throat: Oropharynx is clear and moist.  Eyes: Right eye exhibits no discharge. Left eye exhibits no discharge.  Neck: Neck supple. No tracheal deviation present.  Cardiovascular: Regular rhythm and intact distal pulses.  Tachycardic  Pulmonary/Chest: Effort normal and breath sounds normal. No respiratory  distress. She has no wheezes. She has no rales.  Abdominal: Soft. Bowel sounds are normal. She exhibits no distension. There is no rebound and no guarding.  Mild generalized tenderness  Musculoskeletal: She exhibits no edema.  Neurological: She is alert and oriented to person, place, and time.  Skin: Skin is warm and dry. She is not diaphoretic.  Psychiatric:  Agitated     Labs on Admission: I have personally reviewed following labs and imaging studies  CBC: Recent Labs  Lab 01/23/18 0153  WBC 16.5*  NEUTROABS 11.4*  HGB 12.0  HCT 38.1  MCV 82.3  PLT 536   Basic Metabolic Panel: Recent Labs  Lab 01/23/18 0153  NA 134*  K 5.3*  CL 100  CO2 26  GLUCOSE 171*  BUN 30*  CREATININE 0.84  CALCIUM 8.9   GFR: Estimated Creatinine Clearance: 55.4 mL/min (by C-G formula based on SCr of 0.84 mg/dL). Liver Function Tests: Recent Labs  Lab 01/23/18 0153  AST 25  ALT 27  ALKPHOS 67  BILITOT 0.5  PROT 6.1*  ALBUMIN 3.3*   No results for input(s): LIPASE, AMYLASE in the last 168 hours. No results for input(s): AMMONIA in the last 168 hours. Coagulation Profile: Recent Labs  Lab 01/23/18 0153  INR 1.21   Cardiac Enzymes: No results for input(s): CKTOTAL, CKMB, CKMBINDEX, TROPONINI in  the last 168 hours. BNP (last 3 results) No results for input(s): PROBNP in the last 8760 hours. HbA1C: No results for input(s): HGBA1C in the last 72 hours. CBG: No results for input(s): GLUCAP in the last 168 hours. Lipid Profile: No results for input(s): CHOL, HDL, LDLCALC, TRIG, CHOLHDL, LDLDIRECT in the last 72 hours. Thyroid Function Tests: No results for input(s): TSH, T4TOTAL, FREET4, T3FREE, THYROIDAB in the last 72 hours. Anemia Panel: No results for input(s): VITAMINB12, FOLATE, FERRITIN, TIBC, IRON, RETICCTPCT in the last 72 hours. Urine analysis:    Component Value Date/Time   COLORURINE AMBER (A) 01/05/2018 0258   APPEARANCEUR CLEAR 01/05/2018 0258   APPEARANCEUR Clear 09/19/2016 1633   LABSPEC 1.043 (H) 01/05/2018 0258   PHURINE 6.0 01/05/2018 0258   GLUCOSEU NEGATIVE 01/05/2018 0258   HGBUR NEGATIVE 01/05/2018 0258   BILIRUBINUR NEGATIVE 01/05/2018 0258   BILIRUBINUR Negative 09/19/2016 1633   KETONESUR NEGATIVE 01/05/2018 0258   PROTEINUR NEGATIVE 01/05/2018 0258   UROBILINOGEN 1.0 11/30/2014 2105   NITRITE POSITIVE (A) 01/05/2018 0258   LEUKOCYTESUR NEGATIVE 01/05/2018 0258   LEUKOCYTESUR 1+ (A) 09/19/2016 1633    Radiological Exams on Admission: No results found.  EKG: Independently reviewed.  Sinus tachycardia-heart rate 127. LAFB, similar to prior tracing.   Assessment/Plan Principal Problem:   Upper GI bleed Active Problems:   Alcohol dependence ([facility])   Acute pulmonary embolus ([facility])   Leukocytosis   Physical deconditioning   Suspected upper GI bleed Patient is presenting with a 1 day history of coffee-ground emesis in the setting of home Eliquis use for recent VTE.  Tachycardic but BP stable. Hemoglobin 12.0.  FOBT positive. ED physician discussed the case with on-call GI, patient will be seen in the morning, no further recommendations at this time. -IV fluid resuscitation -IV Protonix infusion -Serial CBCs (q4 hrs) -Keep n.p.o. -Hold  Eliquis -IV Zofran PRN nausea  Leukocytosis Afebrile. White count 16.5.  Rule out infectious etiology. -Check UA -CT angio ordered to reevaluate previously diagnosed PE.  It will show if there is any evidence of pneumonia.  Tachycardia could be explained by her upper GI bleed. -Check  procalcitonin level  -Continue to monitor CBC  Recent bilateral submassive PE, bilateral lower extremity DVT  Patient was recently admitted from October 21 to October 24 for acute bilateral submassive PE with right heart strain, bilateral lower extremity DVT, and pulmonary infarct. Currently tachycardic which could also be explained by her upper GI bleed.  Not hypoxic and satting well on room air.  -Hold home Eliquis in setting of suspected upper GI bleed.  Pending GI evaluation. -CT angio has been ordered to reevaluate previously diagnosed PE   History of alcohol dependence  Patient appeared agitated and I could not obtain a history from her. -CIWA protocol; Ativan PRN  Physical deconditioning -PT evaluation  DVT prophylaxis: SCDs Code Status: DNR.  Signed form present at bedside. Family Communication: No family available. Disposition Plan: Anticipate discharge to nursing home in 1 to 2 days. Consults called: GI Admission status: Observation  Shela Leff MD [facility] Pager [phone]  If 7PM-7AM, please contact night-coverage [email or web address] Password [email or web address]  01/23/2018, 5:29 AM

## 2018-01-24 ENCOUNTER — Encounter (HOSPITAL_COMMUNITY): Payer: Self-pay | Admitting: *Deleted

## 2018-01-24 ENCOUNTER — Encounter (HOSPITAL_COMMUNITY)
Admission: EM | Disposition: A | Discharge status: Skilled Nursing Facility | Payer: Self-pay | Source: Home / Self Care | Attending: Internal Medicine

## 2018-01-24 ENCOUNTER — Observation Stay (HOSPITAL_COMMUNITY): Payer: Medicare Other | Admitting: Certified Registered"

## 2018-01-24 DIAGNOSIS — D72829 Elevated white blood cell count, unspecified: Secondary | ICD-10-CM | POA: Diagnosis present

## 2018-01-24 DIAGNOSIS — Z96651 Presence of right artificial knee joint: Secondary | ICD-10-CM | POA: Diagnosis present

## 2018-01-24 DIAGNOSIS — Z7982 Long term (current) use of aspirin: Secondary | ICD-10-CM | POA: Diagnosis not present

## 2018-01-24 DIAGNOSIS — I10 Essential (primary) hypertension: Secondary | ICD-10-CM | POA: Diagnosis present

## 2018-01-24 DIAGNOSIS — K920 Hematemesis: Secondary | ICD-10-CM | POA: Diagnosis not present

## 2018-01-24 DIAGNOSIS — K922 Gastrointestinal hemorrhage, unspecified: Secondary | ICD-10-CM | POA: Diagnosis present

## 2018-01-24 DIAGNOSIS — Z7901 Long term (current) use of anticoagulants: Secondary | ICD-10-CM | POA: Diagnosis not present

## 2018-01-24 DIAGNOSIS — Z79899 Other long term (current) drug therapy: Secondary | ICD-10-CM | POA: Diagnosis not present

## 2018-01-24 DIAGNOSIS — Z85820 Personal history of malignant melanoma of skin: Secondary | ICD-10-CM | POA: Diagnosis not present

## 2018-01-24 DIAGNOSIS — Z981 Arthrodesis status: Secondary | ICD-10-CM | POA: Diagnosis not present

## 2018-01-24 DIAGNOSIS — Z86718 Personal history of other venous thrombosis and embolism: Secondary | ICD-10-CM | POA: Diagnosis not present

## 2018-01-24 DIAGNOSIS — D62 Acute posthemorrhagic anemia: Secondary | ICD-10-CM | POA: Diagnosis not present

## 2018-01-24 DIAGNOSIS — Z86711 Personal history of pulmonary embolism: Secondary | ICD-10-CM | POA: Diagnosis not present

## 2018-01-24 DIAGNOSIS — K226 Gastro-esophageal laceration-hemorrhage syndrome: Secondary | ICD-10-CM | POA: Diagnosis present

## 2018-01-24 DIAGNOSIS — F319 Bipolar disorder, unspecified: Secondary | ICD-10-CM | POA: Diagnosis present

## 2018-01-24 DIAGNOSIS — G2581 Restless legs syndrome: Secondary | ICD-10-CM | POA: Diagnosis present

## 2018-01-24 DIAGNOSIS — H9190 Unspecified hearing loss, unspecified ear: Secondary | ICD-10-CM | POA: Diagnosis present

## 2018-01-24 DIAGNOSIS — F1020 Alcohol dependence, uncomplicated: Secondary | ICD-10-CM | POA: Diagnosis present

## 2018-01-24 DIAGNOSIS — Z66 Do not resuscitate: Secondary | ICD-10-CM | POA: Diagnosis present

## 2018-01-24 DIAGNOSIS — K219 Gastro-esophageal reflux disease without esophagitis: Secondary | ICD-10-CM | POA: Diagnosis present

## 2018-01-24 DIAGNOSIS — K3189 Other diseases of stomach and duodenum: Secondary | ICD-10-CM | POA: Diagnosis present

## 2018-01-24 DIAGNOSIS — K259 Gastric ulcer, unspecified as acute or chronic, without hemorrhage or perforation: Secondary | ICD-10-CM | POA: Diagnosis present

## 2018-01-24 DIAGNOSIS — Z87891 Personal history of nicotine dependence: Secondary | ICD-10-CM | POA: Diagnosis not present

## 2018-01-24 DIAGNOSIS — I444 Left anterior fascicular block: Secondary | ICD-10-CM | POA: Diagnosis present

## 2018-01-24 HISTORY — PX: BIOPSY: SHX5522

## 2018-01-24 HISTORY — PX: ESOPHAGOGASTRODUODENOSCOPY (EGD) WITH PROPOFOL: SHX5813

## 2018-01-24 LAB — BASIC METABOLIC PANEL
Anion gap: 4 — ABNORMAL LOW (ref 5–15)
BUN: 16 mg/dL (ref 8–23)
CHLORIDE: 110 mmol/L (ref 98–111)
CO2: 25 mmol/L (ref 22–32)
Calcium: 8.3 mg/dL — ABNORMAL LOW (ref 8.9–10.3)
Creatinine, Ser: 0.87 mg/dL (ref 0.44–1.00)
GFR calc Af Amer: >60 mL/min (ref >60)
GFR calc non Af Amer: >60 mL/min (ref >60)
GLUCOSE: 107 mg/dL — AB (ref 70–99)
POTASSIUM: 4.1 mmol/L (ref 3.5–5.1)
Sodium: 139 mmol/L (ref 135–145)

## 2018-01-24 LAB — CBC
HCT: 30.9 % — ABNORMAL LOW (ref 36.0–46.0)
HEMOGLOBIN: 9.3 g/dL — AB (ref 12.0–15.0)
MCH: 25.2 pg — AB (ref 26.0–34.0)
MCHC: 30.1 g/dL (ref 30.0–36.0)
MCV: 83.7 fL (ref 80.0–100.0)
PLATELETS: 203 10*3/uL (ref 150–400)
RBC: 3.69 MIL/uL — ABNORMAL LOW (ref 3.87–5.11)
RDW: 21.2 % — ABNORMAL HIGH (ref 11.5–15.5)
WBC: 8.8 10*3/uL (ref 4.0–10.5)
nRBC: 0 % (ref 0.0–0.2)

## 2018-01-24 LAB — T4, FREE: Free T4: 0.81 ng/dL — ABNORMAL LOW (ref 0.82–1.77)

## 2018-01-24 LAB — TSH: TSH: 1.638 u[IU]/mL (ref 0.350–4.500)

## 2018-01-24 SURGERY — ESOPHAGOGASTRODUODENOSCOPY (EGD) WITH PROPOFOL
Anesthesia: Monitor Anesthesia Care

## 2018-01-24 MED ORDER — CITALOPRAM HYDROBROMIDE 20 MG PO TABS
10.0000 mg | ORAL_TABLET | Freq: Every day | ORAL | Status: DC
  Administered 2018-01-24: 10 mg via ORAL
  Filled 2018-01-24 (×2): qty 1

## 2018-01-24 MED ORDER — POLYETHYLENE GLYCOL 3350 17 G PO PACK
17.0000 g | PACK | Freq: Every day | ORAL | Status: DC
  Administered 2018-01-24 – 2018-01-25 (×2): 17 g via ORAL
  Filled 2018-01-24 (×2): qty 1

## 2018-01-24 MED ORDER — PROPOFOL 10 MG/ML IV BOLUS
INTRAVENOUS | Status: DC | PRN
  Administered 2018-01-24 (×6): 20 mg via INTRAVENOUS

## 2018-01-24 MED ORDER — PHENYLEPHRINE HCL 10 MG/ML IJ SOLN
INTRAMUSCULAR | Status: DC | PRN
  Administered 2018-01-24: 100 ug via INTRAVENOUS

## 2018-01-24 MED ORDER — ACETAMINOPHEN 325 MG PO TABS
650.0000 mg | ORAL_TABLET | ORAL | Status: DC | PRN
  Administered 2018-01-25 (×3): 650 mg via ORAL
  Filled 2018-01-24 (×3): qty 2

## 2018-01-24 MED ORDER — SENNOSIDES-DOCUSATE SODIUM 8.6-50 MG PO TABS: 1.0000 | ORAL_TABLET | Freq: Every day | ORAL | Status: DC | PRN

## 2018-01-24 MED ORDER — LORAZEPAM 1 MG PO TABS
1.0000 mg | ORAL_TABLET | Freq: Once | ORAL | Status: AC
  Administered 2018-01-24: 1 mg via ORAL
  Filled 2018-01-24: qty 1

## 2018-01-24 MED ORDER — ROPINIROLE HCL 1 MG PO TABS: 0.5000 mg | ORAL_TABLET | Freq: Every day | ORAL | Status: DC

## 2018-01-24 MED ORDER — LACTATED RINGERS IV SOLN
INTRAVENOUS | Status: DC
  Administered 2018-01-24: 1000 mL via INTRAVENOUS

## 2018-01-24 MED ORDER — PANTOPRAZOLE SODIUM 40 MG IV SOLR
40.0000 mg | Freq: Two times a day (BID) | INTRAVENOUS | Status: DC
  Administered 2018-01-24 – 2018-01-25 (×3): 40 mg via INTRAVENOUS
  Filled 2018-01-24 (×3): qty 40

## 2018-01-24 MED ORDER — ROPINIROLE HCL 1 MG PO TABS
1.0000 mg | ORAL_TABLET | Freq: Two times a day (BID) | ORAL | Status: DC
  Administered 2018-01-24 (×2): 1 mg via ORAL
  Filled 2018-01-24 (×3): qty 1

## 2018-01-24 MED ORDER — LORAZEPAM 0.5 MG PO TABS
0.5000 mg | ORAL_TABLET | Freq: Once | ORAL | Status: AC
  Administered 2018-01-24: 0.5 mg via ORAL
  Filled 2018-01-24: qty 1

## 2018-01-24 SURGICAL SUPPLY — 15 items

## 2018-01-24 NOTE — Anesthesia Postprocedure Evaluation (Signed)
Anesthesia Post Note  Patient: Denise Kelly  Procedure(s) Performed: ESOPHAGOGASTRODUODENOSCOPY (EGD) WITH PROPOFOL (N/A ) HEMOSTASIS CLIP PLACEMENT     Patient location during evaluation: PACU Anesthesia Type: MAC Level of consciousness: awake, oriented and awake and alert Pain management: pain level controlled Vital Signs Assessment: post-procedure vital signs reviewed and stable Respiratory status: spontaneous breathing, nonlabored ventilation and respiratory function stable Cardiovascular status: stable and blood pressure returned to baseline Postop Assessment: no apparent nausea or vomiting Anesthetic complications: no    Last Vitals:  Vitals:   01/24/18 1118 01/24/18 1537  BP: 104/64 109/61  Pulse: 96 (!) 107  Resp: (!) 21 20  Temp: (!) 36.4 C 37.1 C  SpO2: 100% 98%    Last Pain:  Vitals:   01/24/18 1537  TempSrc: Oral  PainSc: 0-No pain                 Catalina Gravel

## 2018-01-24 NOTE — Progress Notes (Signed)
PROGRESS NOTE                                                                                                                                                                                                             Patient Demographics:    Denise Kelly, is a 77 y.o. female, DOB - [birth year], ULA:[id number]  Admit date - 01/23/2018   Admitting Physician Denise Leff, MD  Outpatient Primary MD for the patient is Denise Small, MD  LOS - 0   Chief Complaint  Patient presents with  . GI Bleeding  . Hematemesis       Brief Narrative    77 y.o. female with medical history significant of bipolar disorder, alcohol dependence , with recent hospitalization secondary to acute DVT/PE, started on Eliquis presenting to the hospital from her ALF for evaluation of coffee-ground emesis.   Hemoglobin is stable on admission, she was admitted for further work-up   Subjective:    Denise Kelly today has, No headache, No chest pain, No abdominal pain -no further vomiting since admission .   Assessment  & Plan :    Principal Problem:   Upper GI bleed Active Problems:   Alcohol dependence (HCC)   Acute pulmonary embolus (HCC)   Leukocytosis   Physical deconditioning   Upper GI bleed -Patient presents with coffee-ground emesis, she is on Eliquis, denies any NSAIDs use, baseline hemoglobin is 13, dropped to 10 range this admission, medically on Protonix drip, clear liquid diet, GI input greatly appreciated, she is FOBT positive suspicion for upper GI bleed, she went for endoscopy 01/24/2018, significant for diminutive Mallory-Weiss tear,, gastric ulcer, which was clipped . -New with Protonix 40 mg twice daily for next 4 weeks, then once daily as long she is on Eliquis, , return for next 24 hours, if stable resume Eliquis on a.m., continue with regular diet,  -Patient to follow with GI as an outpatient regarding biopsy results .  Acute blood loss  anemia -In the setting of upper GI bleed, no indication for transfusion, will need iron on discharge  Leukocytosis - Afebrile. White count 16.5.   Toxic appearing, no clear source of infection, most likely related to vomiting, resolved  Recent bilateral submassive PE, bilateral lower extremity DVT  - Patient was recently admitted from October 21 to October 24 for acute bilateral submassive  PE with right heart strain, bilateral lower extremity DVT, and pulmonary infarct.   CTA chest done during hospital stay significant for largely resolved PE. -She was on heparin GTT yesterday, resume back on Eliquis tomorrow if hemoglobin remained stable  1.6 cm hypodensity at the right thyroid lobe.  -Further work-up as an outpatient, per radiology recommendation consider further evaluation with thyroid ultrasound. If patient is clinically hyperthyroid, consider nuclear medicine thyroid uptake and scan.  TSH is 1.6, free T4 0.81  Alcohol dependence -She denies any alcohol consumption for so many years, will discontinue Denise Kelly  Restless leg syndrome -Reports significant symptoms, will increase her dose  Physical deconditioning -[facility] and subacute rehab, likely will need to go back to continue her rehab PT evaluation    Code Status : DNR  Family Communication  : Discussed with daughter via phone  Disposition Plan  : Home when stable  Consults  :  GI  Procedures  : Endoscopy 01/24/2018  DVT Prophylaxis  : To resume Eliquis tomorrow  Lab Results  Component Value Date   PLT 203 01/24/2018    Antibiotics  :    Anti-infectives (From admission, onward)   None        Objective:   Vitals:   01/24/18 1053 01/24/18 1100 01/24/18 1118 01/24/18 1537  BP: (!) 101/54 (!) 102/54 104/64 109/61  Pulse: (!) 102 98 96 (!) 107  Resp: (!) 21 (!) 31 (!) 21 20  Temp:   (!) 97.5 F (36.4 C) 98.7 F (37.1 C)  TempSrc:   Oral Oral  SpO2: 99% 99% 100% 98%  Weight:      Height:         Wt Readings from Last 3 Encounters:  01/24/18 71.9 kg  01/07/18 70.8 kg  10/02/17 74.4 kg     Intake/Output Summary (Last 24 hours) at 01/24/2018 1622 Last data filed at 01/24/2018 1500 Gross per 24 hour  Intake 1795.96 ml  Output 400 ml  Net 1395.96 ml     Physical Exam  Awake Alert, Oriented X 3, No new F.N deficits, Normal affect Symmetrical Chest wall movement, Good air movement bilaterally, CTAB RRR,No Gallops,Rubs or new Murmurs, No Parasternal Heave +ve B.Sounds, Abd Soft, No tenderness, No rebound - guarding or rigidity. No Cyanosis, Clubbing or edema, No new Rash or bruise        Data Review:    CBC Recent Labs  Lab 01/23/18 0153 01/23/18 0524 01/23/18 0850 01/23/18 1205 01/24/18 0340  WBC 16.5* 11.6* 9.2 10.1 8.8  HGB 12.0 9.8* 10.8* 10.4* 9.3*  HCT 38.1 31.6* 35.5* 34.4* 30.9*  PLT 255 203 157 228 203  MCV 82.3 83.6 87.4 84.5 83.7  MCH 25.9* 25.9* 26.6 25.6* 25.2*  MCHC 31.5 31.0 30.4 30.2 30.1  RDW 20.2* 20.4* 21.0* 21.1* 21.2*  LYMPHSABS 3.8  --   --   --   --   MONOABS 1.0  --   --   --   --   EOSABS 0.1  --   --   --   --   BASOSABS 0.1  --   --   --   --     Chemistries  Recent Labs  Lab 01/23/18 0153 01/24/18 0340  NA 134* 139  K 5.3* 4.1  CL 100 110  CO2 26 25  GLUCOSE 171* 107*  BUN 30* 16  CREATININE 0.84 0.87  CALCIUM 8.9 8.3*  AST 25  --   ALT 27  --  ALKPHOS 67  --   BILITOT 0.5  --    ------------------------------------------------------------------------------------------------------------------ No results for input(s): CHOL, HDL, LDLCALC, TRIG, CHOLHDL, LDLDIRECT in the last 72 hours.  Lab Results  Component Value Date   HGBA1C 5.2 05/26/2016   ------------------------------------------------------------------------------------------------------------------ Recent Labs    01/24/18 0340  TSH 1.638    ------------------------------------------------------------------------------------------------------------------ No results for input(s): VITAMINB12, FOLATE, FERRITIN, TIBC, IRON, RETICCTPCT in the last 72 hours.  Coagulation profile Recent Labs  Lab 01/23/18 0153  INR 1.21    No results for input(s): DDIMER in the last 72 hours.  Cardiac Enzymes No results for input(s): CKMB, TROPONINI, MYOGLOBIN in the last 168 hours.  Invalid input(s): CK ------------------------------------------------------------------------------------------------------------------    Component Value Date/Time   BNP 80.1 05/23/2016 2032    Inpatient Medications  Scheduled Meds: . citalopram  10 mg Oral Daily  . pantoprazole  40 mg Intravenous Q12H  . polyethylene glycol  17 g Oral Daily  . rOPINIRole  1 mg Oral BID   Continuous Infusions:  PRN Meds:.acetaminophen, LORazepam, ondansetron, senna-docusate  Micro Results No results found for this or any previous visit (from the past 240 hour(s)).  Radiology Reports Dg Chest 2 View  Result Date: 01/04/2018 CLINICAL DATA:  Cough EXAM: CHEST - 2 VIEW COMPARISON:  Chest x-ray dated 09/28/2017. FINDINGS: New subtle rounded opacity within the RIGHT mid lung region, only appreciable on the AP view. LEFT lung is clear. No pleural effusion or pneumothorax seen. Heart size and mediastinal contours are stable. IMPRESSION: New subtle rounded opacity within the RIGHT mid lung region, suspicious for early developing pneumonia. Neoplastic process is not excluded. Consider chest CT for further characterization. At minimum, recommend follow-up chest x-ray to ensure resolution. Electronically Signed   By: Franki Cabot M.D.   On: 01/04/2018 17:56   Ct Angio Chest Pe W Or Wo Contrast  Result Date: 01/23/2018 CLINICAL DATA:  Acute onset of hematemesis. Recent bilateral massive pulmonary embolus. Reassess pulmonary emboli to determine need for anticoagulation. EXAM:  CT ANGIOGRAPHY CHEST WITH CONTRAST TECHNIQUE: Multidetector CT imaging of the chest was performed using the standard Kelly during bolus administration of intravenous contrast. Multiplanar CT image reconstructions and MIPs were obtained to evaluate the vascular anatomy. CONTRAST:  143mL ISOVUE-370 IOPAMIDOL (ISOVUE-370) INJECTION 76% COMPARISON:  CTA of the chest performed 01/04/2018 FINDINGS: Cardiovascular: Previously noted bilateral pulmonary emboli have largely resolved, with minimal residual chronic nonobstructive embolus noted at the pulmonary artery to the left lower lobe. The heart is normal in size. Scattered coronary artery calcifications are seen. Mild calcification is noted along the thoracic aorta and proximal great vessels. Mediastinum/Nodes: The mediastinum is otherwise unremarkable. No mediastinal lymphadenopathy is seen. No pericardial effusion is identified. A 1.6 cm hypodensity is noted at the right thyroid lobe. No axillary lymphadenopathy is appreciated. Lungs/Pleura: There has been interval evolution of the Kelly pulmonary infarct at the right lower lobe. Minimal bilateral atelectasis is noted. No pleural effusion or pneumothorax is seen. No masses are identified. Upper Abdomen: Scattered cystic foci are noted throughout the liver. The spleen is unremarkable. The gallbladder is grossly unremarkable. The visualized portions of the pancreas, adrenal glands and kidneys are within normal limits. Musculoskeletal: No acute osseous abnormalities are identified. There is chronic endplate degenerative change at the upper lumbar spine. The visualized musculature is unremarkable in appearance. Review of the MIP images confirms the above findings. IMPRESSION: 1. Previously noted bilateral pulmonary emboli have largely resolved, with minimal residual chronic nonobstructive embolus noted at the pulmonary artery to the  left lower lobe. 2. Interval evolution of Kelly pulmonary infarct at the right lower lung  lobe. 3. Scattered coronary artery calcifications seen. 4. 1.6 cm hypodensity at the right thyroid lobe. Consider further evaluation with thyroid ultrasound. If patient is clinically hyperthyroid, consider nuclear medicine thyroid uptake and scan. 5. Scattered cystic foci throughout the liver. Electronically Signed   By: Garald Balding M.D.   On: 01/23/2018 06:11   Ct Angio Chest Pe W And/or Wo Contrast  Result Date: 01/04/2018 CLINICAL DATA:  77 y/o F; generalized weakness. PE suspected, intermediate prob, positive D-dimer. EXAM: CT ANGIOGRAPHY CHEST WITH CONTRAST TECHNIQUE: Multidetector CT imaging of the chest was performed using the standard Kelly during bolus administration of intravenous contrast. Multiplanar CT image reconstructions and MIPs were obtained to evaluate the vascular anatomy. CONTRAST:  60 cc Isovue 370 COMPARISON:  02/25/2016 CT angiogram of the chest. FINDINGS: Cardiovascular: Satisfactory opacification of the pulmonary arteries to the segmental level. Bilateral pulmonary emboli in the right main and bilateral segmental pulmonary arteries. No saddle embolus. RV/LV = 1.1. Stable mild cardiomegaly. No pericardial effusion. Mild coronary artery calcific atherosclerosis. Normal caliber main pulmonary artery and aorta. Mild aortic atherosclerosis. Mediastinum/Nodes: No enlarged mediastinal, hilar, or axillary lymph nodes. Thyroid gland, trachea, and esophagus demonstrate no significant findings. Lungs/Pleura: Centrally lucent wedge-shaped consolidation within the right lower lobe likely representing a pulmonary infarct. No additional consolidation, effusion, or pneumothorax. Upper Abdomen: Multiple Kelly liver hypodensities likely representing cysts, a few are too Kelly to characterize, stable from prior study. Musculoskeletal: No acute fracture. Mild-to-moderate discogenic degenerative changes of the spine. Review of the MIP images confirms the above findings. IMPRESSION: 1. Positive for  acute PE with CTevidence of right heart strain (RV/LV Ratio = 1.1) consistent with at least submassive (intermediate risk) PE. The presence of right heart strain has been associated with an increased risk of morbidity and mortality. Please activate Code PE by paging [phone]. 2. Right lower lobe wedge-shaped consolidation likely representing a pulmonary infarct. 3. Mild coronary and aortic calcific atherosclerosis. Critical Value/emergent results were called by telephone at the time of interpretation on 01/04/2018 at 7:41 pm to Dr. Blanchie Dessert , who verbally acknowledged these results. Electronically Signed   By: Kristine Garbe M.D.   On: 01/04/2018 19:42   Vas [place] Lower Extremity Venous (dvt)  Result Date: 01/06/2018  Lower Venous Study Indications: Pain.  Limitations: Very sensitive for transducer, patient has very limited tolerance to compression. Performing Technologist: Lorina Rabon  Examination Guidelines: A complete evaluation includes B-mode imaging, spectral Doppler, color Doppler, and power Doppler as needed of all accessible portions of each vessel. Bilateral testing is considered an integral part of a complete examination. Limited examinations for reoccurring indications may be performed as noted.  Right Venous Findings: +---------+---------------+---------+-----------+----------+---------+          CompressibilityPhasicitySpontaneityPropertiesSummary   +---------+---------------+---------+-----------+----------+---------+ CFV      Full           Yes      Yes                  poor flow +---------+---------------+---------+-----------+----------+---------+ SFJ      Full                                                   +---------+---------------+---------+-----------+----------+---------+ FV Prox  Partial  Acute     +---------+---------------+---------+-----------+----------+---------+ FV Mid   Full                                                    +---------+---------------+---------+-----------+----------+---------+ FV DistalFull           Yes      Yes                            +---------+---------------+---------+-----------+----------+---------+ PFV      Full                                                   +---------+---------------+---------+-----------+----------+---------+ POP      None                                         Acute     +---------+---------------+---------+-----------+----------+---------+  Right Technical Findings: Not visualized segments include calf veins. patient refused to exam calf veins. patient was extremely sensitive for compression, exam cannot approach all segments of calf veins. Could not exclude possible calf veins thrombosis existence.  Left Venous Findings: +---------+---------------+---------+-----------+----------------------+-------+          CompressibilityPhasicitySpontaneityProperties            Summary +---------+---------------+---------+-----------+----------------------+-------+ CFV      Partial                                                  Acute   +---------+---------------+---------+-----------+----------------------+-------+ SFJ      Partial                                                  Acute   +---------+---------------+---------+-----------+----------------------+-------+ FV Prox  Partial                            partially             Acute                                               re-cannalized                 +---------+---------------+---------+-----------+----------------------+-------+ FV Mid   Partial                                                  Acute   +---------+---------------+---------+-----------+----------------------+-------+ FV DistalPartial                                                           +---------+---------------+---------+-----------+----------------------+-------+  POP      Partial        No       No                               Acute   +---------+---------------+---------+-----------+----------------------+-------+  Left Technical Findings: Not visualized segments include calf veins. patient was extremely sensitive for compression, exam cannot approach all segments of calf veins. Could not exclude possible calf veins thrombosis existence.   Summary: Right: Findings consistent with acute deep vein thrombosis involving the right femoral vein, and right popliteal vein. Left: Findings consistent with acute deep vein thrombosis involving the left common femoral vein, left femoral vein, and left popliteal vein.  *See table(s) above for measurements and observations. Electronically signed by Curt Jews MD on 01/06/2018 at 7:12:08 PM.    Final       Phillips Climes M.D on 01/24/2018 at 4:22 PM  Between 7am to 7pm - Pager - [phone]  After 7pm go to [email or web address] - password [facility]  [facility] -  Office  [phone]

## 2018-01-24 NOTE — Anesthesia Preprocedure Evaluation (Addendum)
Anesthesia Evaluation  Patient identified by MRN, date of birth, ID band Patient awake    Reviewed: Allergy & Precautions, NPO status , Patient's Chart, lab work & pertinent test results  History of Anesthesia Complications (+) PONV and history of anesthetic complications  Airway Mallampati: II  TM Distance: >3 FB Neck ROM: Full    Dental  (+) Dental Advisory Given, Chipped, Poor Dentition   Pulmonary neg pulmonary ROS, former smoker,    Pulmonary exam normal breath sounds clear to auscultation       Cardiovascular hypertension, Pt. on medications + DVT  + dysrhythmias  Rhythm:Regular Rate:Tachycardia     Neuro/Psych PSYCHIATRIC DISORDERS Anxiety Depression negative neurological ROS     GI/Hepatic Neg liver ROS, GERD  ,  Endo/Other  negative endocrine ROS  Renal/GU negative Renal ROS     Musculoskeletal  (+) Arthritis ,   Abdominal   Peds  Hematology  (+) Blood dyscrasia, anemia ,   Anesthesia Other Findings Day of surgery medications reviewed with the patient.  Reproductive/Obstetrics                             Anesthesia Physical Anesthesia Plan  ASA: III  Anesthesia Plan: MAC   Post-op Pain Management:    Induction: Intravenous  PONV Risk Score and Plan: 3 and Treatment may vary due to age or medical condition, Ondansetron and Propofol infusion  Airway Management Planned: Nasal Cannula  Additional Equipment:   Intra-op Plan:   Post-operative Plan:   Informed Consent: I have reviewed the patients History and Physical, chart, labs and discussed the procedure including the risks, benefits and alternatives for the proposed anesthesia with the patient or authorized representative who has indicated his/her understanding and acceptance.   Dental advisory given  Plan Discussed with: CRNA and Anesthesiologist  Anesthesia Plan Comments: (DNR status-suspend during  perioperative period.)       Anesthesia Quick Evaluation

## 2018-01-24 NOTE — Op Note (Signed)
EGD was performed for coffee-ground emesis.  Findings: Diminutive Mallory-Weiss tear noted at GE junction, nonbleeding, 37 cm from incisors. A 5 mm cratered gastric ulcer noted at the antrum pigmentation, 2 endoclips applied. Erythematous antrum, biopsies taken for H. Pylori. Normal cardia and fundus on retroflexion. Normal duodenal bulb and rest of the duodenum.  Recommendations: PPI twice a day for 4 weeks, then indefinitely as long as Eliquis is continued. Regular diet, resume Eliquis in a.m. Biopsies will be followed as outpatient, when necessary Zofran for nausea/ vomiting.  We'll sign off, please recall if needed.  Ronnette Juniper, M.D.

## 2018-01-24 NOTE — Op Note (Signed)
[facility] Patient Name: Denise Kelly Procedure Date : 01/24/2018 MRN: [id number] Attending MD: Ronnette Juniper , MD Date of Birth: [birth year] CSN: [id number] Age: 77 Admit Type: Inpatient Procedure:                Upper GI endoscopy Indications:              Coffee-ground emesis Providers:                Ronnette Juniper, MD, Burtis Junes, RN, Cletis Athens,                            Technician Referring MD:              Medicines:                Monitored Anesthesia Care Complications:            No immediate complications. Estimated blood loss:                            None. Estimated Blood Loss:     Estimated blood loss was minimal. Procedure:                Pre-Anesthesia Assessment:                           - Prior to the procedure, a History and Physical                            was performed, and patient medications and                            allergies were reviewed. The patient's tolerance of                            previous anesthesia was also reviewed. The risks                            and benefits of the procedure and the sedation                            options and risks were discussed with the patient.                            All questions were answered, and informed consent                            was obtained. Prior Anticoagulants: The patient has                            taken Eliquis (apixaban), last dose was 1 day prior                            to procedure. ASA Grade Assessment: III - A patient  with severe systemic disease. After reviewing the                            risks and benefits, the patient was deemed in                            satisfactory condition to undergo the procedure.                           After obtaining informed consent, the endoscope was                            passed under direct vision. Throughout the                            procedure, the patient's blood pressure, pulse,  and                            oxygen saturations were monitored continuously. The                            GIF-H190 ([id number]) Olympus adult EGD was introduced                            through the mouth, and advanced to the second part                            of duodenum. The upper GI endoscopy was                            accomplished without difficulty. The patient                            tolerated the procedure well. Scope In: Scope Out: Findings:      A 3 mm non-bleeding Mallory-Weiss tear with no stigmata of recent       bleeding was found.      One non-bleeding cratered gastric ulcer with pigmented material was       found in the gastric antrum. The lesion was 5 mm in largest dimension.       To close ulcer defect, two hemostatic clips were successfully placed.       There was no bleeding at the end of the procedure.      Localized moderately erythematous mucosa without bleeding was found in       the gastric antrum. Biopsies were taken with a cold forceps for       Helicobacter pylori testing.      The cardia and gastric fundus were normal on retroflexion.      The examined duodenum was normal. Impression:               - Diminutive Mallory-Weiss tear.                           - Non-bleeding gastric ulcer with pigmented  material. Clips were placed.                           - Erythematous mucosa in the antrum. Biopsied.                           - Normal examined duodenum. Moderate Sedation:      Patient did not receive moderate sedation for this procedure, but       instead received monitored anesthesia care. Recommendation:           - Resume regular diet.                           - Use Protonix (pantoprazole) 40 mg PO BID for 4                            weeks,thereafter once a day indefinitely as long as                            patient is on Eliquis.                           - Await pathology results.                            - Resume Eliquis (apixaban) at prior dose tomorrow. Procedure Code(s):        --- Professional ---                           [phone], Esophagogastroduodenoscopy, flexible,                            transoral; with biopsy, single or multiple Diagnosis Code(s):        --- Professional ---                           K22.6, Gastro-esophageal laceration-hemorrhage                            syndrome                           K25.9, Gastric ulcer, unspecified as acute or                            chronic, without hemorrhage or perforation                           K31.89, Other diseases of stomach and duodenum                           K92.0, Hematemesis CPT copyright 2018 [employer]. All rights reserved. The codes documented in this report are preliminary and upon coder review may  be revised to meet current compliance requirements. Ronnette Juniper, MD 01/24/2018 10:55:25 AM This report has been signed electronically. Number of Addenda: 0

## 2018-01-24 NOTE — Transfer of Care (Signed)
Immediate Anesthesia Transfer of Care Note  Patient: Denise Kelly  Procedure(s) Performed: ESOPHAGOGASTRODUODENOSCOPY (EGD) WITH PROPOFOL (N/A ) HEMOSTASIS CLIP PLACEMENT  Patient Location: PACU  Anesthesia Type:MAC  Level of Consciousness: awake, alert , oriented and patient cooperative  Airway & Oxygen Therapy: Patient Spontanous Breathing and Patient connected to nasal cannula oxygen  Post-op Assessment: Report given to RN and Post -op Vital signs reviewed and stable  Post vital signs: Reviewed and stable  Last Vitals:  Vitals Value Taken Time  BP 97/50 01/24/2018 10:38 AM  Temp    Pulse 99 01/24/2018 10:38 AM  Resp 21 01/24/2018 10:38 AM  SpO2 97 % 01/24/2018 10:38 AM  Vitals shown include unvalidated device data.  Last Pain:  Vitals:   01/24/18 0941  TempSrc: Oral  PainSc: 0-No pain         Complications: No apparent anesthesia complications

## 2018-01-24 NOTE — Brief Op Note (Signed)
01/23/2018 - 01/24/2018  10:46 AM  PATIENT:  Denise Kelly  77 y.o. female  PRE-OPERATIVE DIAGNOSIS:  coffee ground emesis  POST-OPERATIVE DIAGNOSIS:  ? small mallory weiss tear, gastric ulcer s/p clip x 2  PROCEDURE:  Procedure(s): ESOPHAGOGASTRODUODENOSCOPY (EGD) WITH PROPOFOL (N/A) HEMOSTASIS CLIP PLACEMENT  SURGEON:  Surgeon(s) and Role:    Ronnette Juniper, MD - Primary  PHYSICIAN ASSISTANT:   ASSISTANTS:Lisa Marissa Calamity, Rn, Portal, Tech   ANESTHESIA:   MAC  EBL:  Minimal  BLOOD ADMINISTERED:none  DRAINS: none   LOCAL MEDICATIONS USED:  NONE  SPECIMEN:  Biopsy / Limited Resection  DISPOSITION OF SPECIMEN:  PATHOLOGY  COUNTS:  YES  TOURNIQUET:  * No tourniquets in log *  DICTATION: .Dragon Dictation  PLAN OF CARE: Admit to inpatient   PATIENT DISPOSITION:  PACU - hemodynamically stable.   Delay start of Pharmacological VTE agent (>24hrs) due to surgical blood loss or risk of bleeding: yes

## 2018-01-24 NOTE — Interval H&P Note (Signed)
History and Physical Interval Note: 77/female with coffee ground emesis, was on eliquis(last dose 01/22/18) for an EGD today.  01/24/2018 9:38 AM  [name]  has presented today for EGD, with the diagnosis of coffee ground emesis  The various methods of treatment have been discussed with the patient and family. After consideration of risks, benefits and other options for treatment, the patient has consented to  Procedure(s): ESOPHAGOGASTRODUODENOSCOPY (EGD) WITH PROPOFOL (N/A) as a surgical intervention .  The patient's history has been reviewed, patient examined, no change in status, stable for surgery.  I have reviewed the patient's chart and labs.  Questions were answered to the patient's satisfaction.     Ronnette Juniper

## 2018-01-25 LAB — CBC
HCT: 28.4 % — ABNORMAL LOW (ref 36.0–46.0)
HEMOGLOBIN: 8.7 g/dL — AB (ref 12.0–15.0)
MCH: 25.4 pg — ABNORMAL LOW (ref 26.0–34.0)
MCHC: 30.6 g/dL (ref 30.0–36.0)
MCV: 82.8 fL (ref 80.0–100.0)
NRBC: 0 % (ref 0.0–0.2)
Platelets: 193 10*3/uL (ref 150–400)
RBC: 3.43 MIL/uL — ABNORMAL LOW (ref 3.87–5.11)
RDW: 21.2 % — ABNORMAL HIGH (ref 11.5–15.5)
WBC: 7.6 10*3/uL (ref 4.0–10.5)

## 2018-01-25 LAB — BASIC METABOLIC PANEL
Anion gap: 5 (ref 5–15)
BUN: 11 mg/dL (ref 8–23)
CALCIUM: 8.4 mg/dL — AB (ref 8.9–10.3)
CHLORIDE: 109 mmol/L (ref 98–111)
CO2: 24 mmol/L (ref 22–32)
CREATININE: 0.8 mg/dL (ref 0.44–1.00)
GFR calc non Af Amer: >60 mL/min (ref >60)
Glucose, Bld: 132 mg/dL — ABNORMAL HIGH (ref 70–99)
Potassium: 3.5 mmol/L (ref 3.5–5.1)
SODIUM: 138 mmol/L (ref 135–145)

## 2018-01-25 MED ORDER — APIXABAN 5 MG PO TABS
5.0000 mg | ORAL_TABLET | Freq: Two times a day (BID) | ORAL | Status: DC
  Administered 2018-01-25: 5 mg via ORAL
  Filled 2018-01-25: qty 1

## 2018-01-25 MED ORDER — ACETAMINOPHEN 325 MG PO TABS: 650.0000 mg | ORAL_TABLET | Freq: Four times a day (QID) | ORAL | Status: DC | PRN

## 2018-01-25 MED ORDER — FERROUS SULFATE 325 (65 FE) MG PO TBEC: 325.0000 mg | DELAYED_RELEASE_TABLET | Freq: Two times a day (BID) | ORAL | 3 refills | Status: DC

## 2018-01-25 MED ORDER — PANTOPRAZOLE SODIUM 40 MG PO TBEC: 40.0000 mg | DELAYED_RELEASE_TABLET | Freq: Two times a day (BID) | ORAL | Status: DC

## 2018-01-25 NOTE — Progress Notes (Signed)
Discharge to: [facility] Anticipated discharge date: 01/25/18 Family notified: Left voicemail for daughter Transportation by: [facility]  Report #: [phone], Room 60  [place] signing off.  Laveda Abbe LCSW [phone]

## 2018-01-25 NOTE — Progress Notes (Signed)
CSW acknowledging patient discharge back to SNF. Per chart review, patient recent readmit, was discharged to [facility] (see last assessment below). Patient stable to return. CSW alerted [facility] Admissions, they are preparing her room. CSW to follow and set up transport when [facility] is able to take the patient back this afternoon.  Laveda Abbe, [place] Clinical Social Worker [phone]        Clinical Social Work Assessment  Patient Details  Name: Denise Kelly MRN: [id number] Date of Birth: [birth year]  Date of referral:  01/06/18               Reason for consult:  Facility Placement, Discharge Planning                    Permission sought to share information with:  Chartered certified accountant granted to share information::  Yes, Verbal Permission Granted             Name::                   Agency::  [facility]             Relationship::                Contact Information:     Housing/Transportation Living arrangements for the past 2 months:  Apartment Source of Information:  Patient, Medical Team Patient Interpreter Needed:  None Criminal Activity/Legal Involvement Pertinent to Current Situation/Hospitalization:  No - Comment as needed Significant Relationships:  Adult Children Lives with:  Self Do you feel safe going back to the place where you live?  Yes Need for family participation in patient care:  Yes (Comment)  Care giving concerns:  PT recommending SNF once medically stable for discharge.   Social Worker assessment / plan:  CSW met with patient. RN at bedside. CSW introduced role and explained that PT recommendations would be discussed. Patient agreeable to SNF placement at [facility]. CSW sent referral for review. No further concerns. CSW encouraged patient to contact CSW as needed.  CSW will continue to follow patient for support and facilitate discharge to SNF once medically stable.  Employment status:   Retired Forensic scientist:  [employer] PT Recommendations:  [place] / Referral to community resources:  [place]  Patient/Family's Response to care:  Patient agreeable to SNF placement at [facility]. [name] supportive and involved in patient's care. Patient appreciated social work intervention.  Patient/Family's Understanding of and Emotional Response to Diagnosis, Current Treatment, and Prognosis:  Patient has a good understanding of the reason for admission and her need for rehab prior to returning home. Patient appears pleased with hospital care.  Emotional Assessment Appearance:  Appears stated age Attitude/Demeanor/Rapport:  Engaged, Gracious Affect (typically observed):  Accepting, Appropriate Orientation:  Oriented to Self, Oriented to Place, Oriented to  Time, Oriented to Situation Alcohol / Substance use:  Alcohol Use Psych involvement (Current and /or in the community):  No (Comment)  Discharge Needs  Concerns to be addressed:  Care Coordination Readmission within the last 30 days:  No Current discharge risk:  Dependent with Mobility, Lives alone Barriers to Discharge:  Continued Medical Work up   Candie Chroman, LCSW 01/06/2018, 2:52 PM

## 2018-01-25 NOTE — NC FL2 (Signed)
[place] LEVEL OF CARE SCREENING TOOL     IDENTIFICATION  Patient Name: Denise Kelly Birthdate: [birth year] Sex: female Admission Date (Current Location): 01/23/2018  [facility] and [place] Number:  Herbalist and Address:  [facility], [place] [address], [place], [place] [address]      Provider Number: [id number]  Attending Physician Name and Address:  Elgergawy, Silver Huguenin, MD  Relative Name and Phone Number:       Current Level of Care: Hospital Recommended Level of Care: [place] Prior Approval Number:    Date Approved/Denied:   PASRR Number:    Discharge Plan: SNF    Current Diagnoses: Patient Active Problem List   Diagnosis Date Noted  . Upper GI bleed 01/23/2018  . Leukocytosis 01/23/2018  . Physical deconditioning 01/23/2018  . Acute pulmonary embolus ([place]) 01/04/2018  . Acute encephalopathy 09/28/2017  . Constipation, slow transit 09/28/2017  . Unsteady gait 04/28/2017  . Spondylolisthesis 07/17/2016  . Bilateral lower extremity edema 07/17/2016  . Chronic pain syndrome   . Depression 05/23/2016  . Chronic back pain 02/25/2016  . Lesion of liver 02/25/2016  . History of fracture of left hip 11/28/2015  . Fall 11/28/2015  . RLS (restless legs syndrome) 11/28/2015  . Confusion 03/14/2014  . Recurrent UTI 03/14/2014  . Effusion of right knee 02/22/2014  . Alcohol dependence ([place]) 02/22/2014  . Substance induced mood disorder ([place]) 02/22/2014  . Hepatic encephalopathy ([place])   . Altered mental status 02/21/2014  . SIRS (systemic inflammatory response syndrome) ([place]) 02/21/2014  . S/P total knee arthroplasty 01/02/2014  . Essential hypertension, benign 11/10/2013  . LAFB (left anterior fascicular block) 11/10/2013  . Obesity, unspecified 11/10/2013    Orientation RESPIRATION BLADDER Height & Weight     Self, Time, Situation, Place  Normal Incontinent Weight: 158 lb 8.2 oz (71.9 kg) Height:   5\' 4"  (162.6 cm)  BEHAVIORAL SYMPTOMS/MOOD NEUROLOGICAL BOWEL NUTRITION STATUS      Continent Diet(see DC summary)  AMBULATORY STATUS COMMUNICATION OF NEEDS Skin   Limited Assist Verbally Normal                       Personal Care Assistance Level of Assistance  Bathing, Feeding, Dressing Bathing Assistance: Limited assistance Feeding assistance: Limited assistance Dressing Assistance: Limited assistance     Functional Limitations Info  Sight, Hearing, Speech Sight Info: Impaired Hearing Info: Impaired Speech Info: Adequate    SPECIAL CARE FACTORS FREQUENCY  PT (By licensed PT), OT (By licensed OT)     PT Frequency: 5x/wk OT Frequency: 5x/wk            Contractures Contractures Info: Not present    Additional Factors Info  Code Status, Allergies, Isolation Precautions Code Status Info: DNR Allergies Info: Sulfa Antibiotics, Coreg Carvedilol, Motrin Ibuprofen, Toprol Xl Metoprolol Tartrate, Doxycycline Hyclate, Flovent Hfa Fluticasone, Statins     Isolation Precautions Info: Contact precautions; OMDRO     Current Medications (01/25/2018):  This is the current hospital active medication list Current Facility-Administered Medications  Medication Dose Route Frequency Provider Last Rate Last Dose  . acetaminophen (TYLENOL) tablet 650 mg  650 mg Oral Q4H PRN Ronnette Juniper, MD   650 mg at 01/25/18 1305  . apixaban (ELIQUIS) tablet 5 mg  5 mg Oral BID Elgergawy, Silver Huguenin, MD   5 mg at 01/25/18 0840  . citalopram (CELEXA) tablet 10 mg  10 mg Oral Daily Ronnette Juniper,  MD   10 mg at 01/24/18 1302  . ondansetron (ZOFRAN) injection 4 mg  4 mg Intravenous Q6H PRN Ronnette Juniper, MD   4 mg at 01/25/18 0932  . pantoprazole (PROTONIX) injection 40 mg  40 mg Intravenous Q12H Ronnette Juniper, MD   40 mg at 01/25/18 0840  . polyethylene glycol (MIRALAX / GLYCOLAX) packet 17 g  17 g Oral Daily Ronnette Juniper, MD   17 g at 01/25/18 0841  . rOPINIRole (REQUIP) tablet 1 mg  1 mg Oral BID Elgergawy,  Silver Huguenin, MD   1 mg at 01/24/18 2242  . senna-docusate (Senokot-S) tablet 1 tablet  1 tablet Oral Daily PRN Ronnette Juniper, MD         Discharge Medications: Please see discharge summary for a list of discharge medications.  Relevant Imaging Results:  Relevant Lab Results:   Additional Information SS#: [id number]  Geralynn Ochs, LCSW

## 2018-01-25 NOTE — Discharge Summary (Signed)
Denise Kelly, is a 77 y.o. female  DOB Apr 28, 1940  MRN [id number].  Admission date:  01/23/2018  Admitting Physician  Shela Leff, MD  Discharge Date:  01/25/2018   Primary MD  [name], MD  Recommendations for primary care physician for things to follow:  -Please check CBC, BMP in 48 hours to ensure stable hemoglobin, it was 8.7 on discharge -Shunt will need to continue with the Protonix 40 mg oral twice daily total of 6 weeks, then to continue once daily after that as long she is on Eliquis,- -aspirin has been stopped on discharge, to be resumed by PCP when appropriate -Patient has been complaining of dizziness, leg twitching, I think this is most likely related to akathisia at that started shortly after she was started on Celexa, I have stopped Celexa. -Further work-up as an outpatient for incidental finding of thyroid nodule.   Admission Diagnosis  Upper GI bleed [K92.2] Hematemesis with nausea [K92.0]   Discharge Diagnosis  Upper GI bleed [K92.2] Hematemesis with nausea [K92.0]    Principal Problem:   Upper GI bleed Active Problems:   Alcohol dependence ([place])   Acute pulmonary embolus ([facility])   Leukocytosis   Physical deconditioning      Past Medical History:  Diagnosis Date  . Alcohol abuse    ETOH and xanax  . Anxiety    Panic attacks   . Arthritis   . Cancer ([place])    skin- basal , squamous , melonoma- right hand  . Cellulitis    right leg  . Complication of anesthesia 2009   "felt drunk for a week after"- AVM- both times- felt drunk  . Depression   . Encephalopathy   . GERD (gastroesophageal reflux disease)   . HOH (hard of hearing)   . HOH (hard of hearing)   . Hypertension    not on mediacations  . Palpitations   . PONV (postoperative nausea and vomiting)   . Spondylolisthesis of lumbosacral region   . UTI (urinary tract infection)    frequently    Past  Surgical History:  Procedure Laterality Date  . BIOPSY  01/24/2018   Procedure: BIOPSY;  Surgeon: Ronnette Juniper, MD;  Location: [place];  Service: Gastroenterology;;  . BREAST SURGERY Right    2 breast biopsies  . 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 COLONOSCOPY  2011   polyps  . ESOPHAGOGASTRODUODENOSCOPY (EGD) WITH PROPOFOL N/A 01/24/2018   Procedure: ESOPHAGOGASTRODUODENOSCOPY (EGD) WITH PROPOFOL;  Surgeon: Ronnette Juniper, MD;  Location: [place];  Service: Gastroenterology;  Laterality: N/A;  . EYE SURGERY Bilateral    cataracts  . INTRAMEDULLARY (IM) NAIL INTERTROCHANTERIC Left 11/29/2015   Procedure: LEFT HIP   NAIL;  Surgeon: Renette Butters, MD;  Location: [place];  Service: Orthopedics;  Laterality: Left;  . JOINT REPLACEMENT Left 09/2009   knee  . LUMBAR FUSION  07/17/2016   Lumbar four-five Posterior lumbar interbody fusion (N/A)  . TONSILLECTOMY    . TOTAL KNEE ARTHROPLASTY  Right 01/02/2014   Procedure: TOTAL KNEE ARTHROPLASTY;  Surgeon: [name], MD;  Location: [facility] [facility];  Service: Orthopedics;  Laterality: Right;  . TUBAL LIGATION         History of present illness and  Hospital Course:     Kindly see H&P for history of present illness and admission details, please review complete Labs, Consult reports and Test reports for all details in brief  HPI  from the history and physical done on the day of admission 01/23/2018 HPI: Denise Kelly is a 77 y.o. female with medical history significant of bipolar disorder, alcohol dependence presenting to the hospital from her ALF for evaluation of coffee-ground emesis.  Patient reports having 2 episodes of vomiting today.  She describes the vomitus " brown mush."  Denies having any abdominal pain or diarrhea.  States she has been taking Eliquis since her previous hospitalization.  Denies having any chest pain or shortness of breath.  No further history could be obtained from her as patient appeared  agitated.  Patient was recently admitted from October 21 to October 24 for acute bilateral submassive PE with right heart strain, bilateral lower extremity DVT, and pulmonary infarct.  Patient was discharged on Eliquis.  ED Course: Afebrile, tachycardic.  Hemoglobin 12.0.  FOBT positive.  White count 16.5.  ED physician discussed the case with on-call GI, patient will be seen in the morning, no further recommendations at this time. CT angio has been ordered to reevaluate previously diagnosed PE.     Hospital Course   77 y.o.femalewith medical history significant ofbipolar disorder, alcoholdependence, with recent hospitalization secondary to acute DVT/PE, started on Eliquis presenting to the hospital from her ALF for evaluation of coffee-ground emesis.  Hemoglobin is stable on admission, she was admitted for further work-up.   Upper GI bleed -Patient presents with coffee-ground emesis, she is on Eliquis, denies any NSAIDs use, baseline hemoglobin is 13, dropped to 10 range this admission, 8.7 on discharge, GI input greatly appreciated, she is FOBT positive suspicion for upper GI bleed, she went for endoscopy 01/24/2018, significant for diminutive Mallory-Weiss tear, gastric ulcer, which was clipped . -New with Protonix 40 mg twice daily for next 4-6 weeks, then once daily as long she is on Eliquis,  -She was resumed on Eliquis day of discharge -Patient to follow with GI as an outpatient regarding biopsy results . -Aspirin stopped on discharge  Acute blood loss anemia -In the setting of upper GI bleed, no indication for transfusion, globin is 8.7 on discharge, start on iron supplements  Leukocytosis - Afebrile.White count 16.5.  Toxic appearing, no clear source of infection, most likely related to vomiting, resolved  Recent bilateral submassive PE,bilateral lower extremity DVT - Patient was recently admitted from October 21 to October 24 for acute bilateral submassive PE  with right heart strain, bilateral lower extremity DVT, and pulmonary infarct.  CTA chest done during hospital stay significant for largely resolved PE. -Back on Eliquis  1.6 cm hypodensity at the right thyroid lobe.  -Further work-up as an outpatient, per radiology recommendation consider further evaluation with thyroid ultrasound. If patient is clinically hyperthyroid, consider nuclear medicine thyroid uptake and scan.  TSH is 1.6, free T4 0.81  Alcohol dependence -No evidence of withdrawals during hospital stay  Questionable restless leg syndrome -Think the symptoms are most likely related to akathisia from recently started Celexa, as description does not resemble restless leg syndrome, have stopped Celexa, as we will have stopped Requip as symptoms does not  resemble restless leg syndrome  Physical deconditioning -PT consulted     Discharge Condition:  Stable   Follow UP      Discharge Instructions  and  Discharge Medications     Discharge Instructions    Discharge instructions   Complete by:  As directed    Follow with Primary MD [name], MD in 7 days   Get CBC, CMP,  checked  by Primary MD next visit.    Activity: As tolerated with Full fall precautions use walker/cane & assistance as needed   Disposition SNF   Diet: Regular diet with feeding assistance and aspiration precautions.  For Heart failure patients - Check your Weight same time everyday, if you gain over 2 pounds, or you develop in leg swelling, experience more shortness of breath or chest pain, call your Primary MD immediately. Follow Cardiac Low Salt Diet and 1.5 lit/day fluid restriction.   On your next visit with your primary care physician please Get Medicines reviewed and adjusted.   Please request your Prim.MD to go over all Hospital Tests and Procedure/Radiological results at the follow up, please get all Hospital records sent to your Prim MD by signing hospital release before you  go home.   If you experience worsening of your admission symptoms, develop shortness of breath, life threatening emergency, suicidal or homicidal thoughts you must seek medical attention immediately by calling 911 or calling your MD immediately  if symptoms less severe.  You Must read complete instructions/literature along with all the possible adverse reactions/side effects for all the Medicines you take and that have been prescribed to you. Take any new Medicines after you have completely understood and accpet all the possible adverse reactions/side effects.   Do not drive, operating heavy machinery, perform activities at heights, swimming or participation in water activities or provide baby sitting services if your were admitted for syncope or siezures until you have seen by Primary MD or a Neurologist and advised to do so again.  Do not drive when taking Pain medications.    Do not take more than prescribed Pain, Sleep and Anxiety Medications  Special Instructions: If you have smoked or chewed Tobacco  in the last 2 yrs please stop smoking, stop any regular Alcohol  and or any Recreational drug use.  Wear Seat belts while driving.   Please note  You were cared for by a hospitalist during your hospital stay. If you have any questions about your discharge medications or the care you received while you were in the hospital after you are discharged, you can call the unit and asked to speak with the hospitalist on call if the hospitalist that took care of you is not available. Once you are discharged, your primary care physician will handle any further medical issues. Please note that NO REFILLS for any discharge medications will be authorized once you are discharged, as it is imperative that you return to your primary care physician (or establish a relationship with a primary care physician if you do not have one) for your aftercare needs so that they can reassess your need for medications and  monitor your lab values.   Discharge instructions   Complete by:  As directed    Follow with Primary MD [name], MD in 7 days   Get CBC, CMP,  checked  by Primary MD next visit.    Activity: As tolerated with Full fall precautions use walker/cane & assistance as needed   Disposition SNF   Diet:  Regular diet with feeding assistance and aspiration precautions.  For Heart failure patients - Check your Weight same time everyday, if you gain over 2 pounds, or you develop in leg swelling, experience more shortness of breath or chest pain, call your Primary MD immediately. Follow Cardiac Low Salt Diet and 1.5 lit/day fluid restriction.   On your next visit with your primary care physician please Get Medicines reviewed and adjusted.   Please request your Prim.MD to go over all Hospital Tests and Procedure/Radiological results at the follow up, please get all Hospital records sent to your Prim MD by signing hospital release before you go home.   If you experience worsening of your admission symptoms, develop shortness of breath, life threatening emergency, suicidal or homicidal thoughts you must seek medical attention immediately by calling 911 or calling your MD immediately  if symptoms less severe.  You Must read complete instructions/literature along with all the possible adverse reactions/side effects for all the Medicines you take and that have been prescribed to you. Take any new Medicines after you have completely understood and accpet all the possible adverse reactions/side effects.   Do not drive, operating heavy machinery, perform activities at heights, swimming or participation in water activities or provide baby sitting services if your were admitted for syncope or siezures until you have seen by Primary MD or a Neurologist and advised to do so again.  Do not drive when taking Pain medications.    Do not take more than prescribed Pain, Sleep and Anxiety Medications  Special  Instructions: If you have smoked or chewed Tobacco  in the last 2 yrs please stop smoking, stop any regular Alcohol  and or any Recreational drug use.  Wear Seat belts while driving.   Please note  You were cared for by a hospitalist during your hospital stay. If you have any questions about your discharge medications or the care you received while you were in the hospital after you are discharged, you can call the unit and asked to speak with the hospitalist on call if the hospitalist that took care of you is not available. Once you are discharged, your primary care physician will handle any further medical issues. Please note that NO REFILLS for any discharge medications will be authorized once you are discharged, as it is imperative that you return to your primary care physician (or establish a relationship with a primary care physician if you do not have one) for your aftercare needs so that they can reassess your need for medications and monitor your lab values.   Increase activity slowly   Complete by:  As directed    Increase activity slowly   Complete by:  As directed      Allergies as of 01/25/2018      Reactions   Sulfa Antibiotics Hives, Swelling, Other (See Comments)   Reaction:  Facial/eye swelling    Coreg [carvedilol] Other (See Comments)   Memory loss   Motrin [ibuprofen] Palpitations   Toprol Xl [metoprolol Tartrate] Cough   Doxycycline Hyclate Other (See Comments)   HEARTBURN   Flovent Hfa [fluticasone] Itching   Statins Other (See Comments)   MENTAL STATUS CHANGE      Medication List    STOP taking these medications   aspirin EC 81 MG tablet   citalopram 10 MG tablet Commonly known as:  CELEXA   rOPINIRole 0.5 MG tablet Commonly known as:  REQUIP     TAKE these medications   acetaminophen 325 MG  tablet Commonly known as:  TYLENOL Take 2 tablets (650 mg total) by mouth every 6 (six) hours as needed for mild pain. What changed:  when to take this    acidophilus Caps capsule Take 2 capsules by mouth daily.   apixaban 5 MG Tabs tablet Commonly known as:  ELIQUIS Take 1 tablet (5 mg total) by mouth 2 (two) times daily.   estradiol 0.1 MG/GM vaginal cream Commonly known as:  ESTRACE Place 1 Applicatorful vaginally 2 (two) times a week.   ferrous sulfate 325 (65 FE) MG EC tablet Take 1 tablet (325 mg total) by mouth 2 (two) times daily.   gabapentin 100 MG capsule Commonly known as:  NEURONTIN Take 2 capsules (200 mg total) by mouth 2 (two) times daily.   hydrOXYzine 25 MG tablet Commonly known as:  ATARAX/VISTARIL Take 25 mg by mouth at bedtime.   loperamide 2 MG capsule Commonly known as:  IMODIUM Take 1 capsule (2 mg total) by mouth 2 (two) times daily as needed for diarrhea or loose stools.   loratadine 10 MG tablet Commonly known as:  CLARITIN Take 1 tablet (10 mg total) by mouth daily.   multivitamin with minerals Tabs tablet Take 1 tablet by mouth daily.   ondansetron 4 MG disintegrating tablet Commonly known as:  ZOFRAN-ODT Take 4 mg by mouth every 8 (eight) hours as needed for nausea.   pantoprazole 40 MG tablet Commonly known as:  PROTONIX Take 1 tablet (40 mg total) by mouth 2 (two) times daily before a meal.   polyethylene glycol packet Commonly known as:  MIRALAX / GLYCOLAX Take 17 g by mouth daily.   senna-docusate 8.6-50 MG tablet Commonly known as:  Senokot-S Take 1 tablet by mouth daily as needed for mild constipation.   spironolactone 50 MG tablet Commonly known as:  ALDACTONE Take 50 mg by mouth 2 (two) times daily.   traZODone 50 MG tablet Commonly known as:  DESYREL Take 1 tablet (50 mg total) by mouth daily as needed for sleep. What changed:  when to take this   Zinc Oxide 12.8 % ointment Commonly known as:  TRIPLE PASTE Apply topically as needed for irritation. What changed:    how much to take  when to take this         Diet and Activity recommendation: See Discharge  Instructions above   Consults obtained -  GI   Major procedures and Radiology Reports - PLEASE review detailed and final reports for all details, in brief -   EGD was performed for coffee-ground emesis.Ronnette Juniper, M.D.  Findings: Diminutive Mallory-Weiss tear noted at GE junction, nonbleeding, 37 cm from incisors. A 5 mm cratered gastric ulcer noted at the antrum pigmentation, 2 endoclips applied. Erythematous antrum, biopsies taken for H. Pylori. Normal cardia and fundus on retroflexion. Normal duodenal bulb and rest of the duodenum.  Recommendations: PPI twice a day for 4 weeks, then indefinitely as long as Eliquis is continued. Regular diet, resume Eliquis in a.m. Biopsies will be followed as outpatient, when necessary Zofran for nausea/ vomiting.      Dg Chest 2 View  Result Date: 01/04/2018 CLINICAL DATA:  Cough EXAM: CHEST - 2 VIEW COMPARISON:  Chest x-ray dated 09/28/2017. FINDINGS: New subtle rounded opacity within the RIGHT mid lung region, only appreciable on the AP view. LEFT lung is clear. No pleural effusion or pneumothorax seen. Heart size and mediastinal contours are stable. IMPRESSION: New subtle rounded opacity within the RIGHT mid lung region, suspicious  for early developing pneumonia. Neoplastic process is not excluded. Consider chest CT for further characterization. At minimum, recommend follow-up chest x-ray to ensure resolution. Electronically Signed   By: Franki Cabot M.D.   On: 01/04/2018 17:56   Ct Angio Chest Pe W Or Wo Contrast  Result Date: 01/23/2018 CLINICAL DATA:  Acute onset of hematemesis. Recent bilateral massive pulmonary embolus. Reassess pulmonary emboli to determine need for anticoagulation. EXAM: CT ANGIOGRAPHY CHEST WITH CONTRAST TECHNIQUE: Multidetector CT imaging of the chest was performed using the standard protocol during bolus administration of intravenous contrast. Multiplanar CT image reconstructions and MIPs were obtained to  evaluate the vascular anatomy. CONTRAST:  129mL ISOVUE-370 IOPAMIDOL (ISOVUE-370) INJECTION 76% COMPARISON:  CTA of the chest performed 01/04/2018 FINDINGS: Cardiovascular: Previously noted bilateral pulmonary emboli have largely resolved, with minimal residual chronic nonobstructive embolus noted at the pulmonary artery to the left lower lobe. The heart is normal in size. Scattered coronary artery calcifications are seen. Mild calcification is noted along the thoracic aorta and proximal great vessels. Mediastinum/Nodes: The mediastinum is otherwise unremarkable. No mediastinal lymphadenopathy is seen. No pericardial effusion is identified. A 1.6 cm hypodensity is noted at the right thyroid lobe. No axillary lymphadenopathy is appreciated. Lungs/Pleura: There has been interval evolution of the small pulmonary infarct at the right lower lobe. Minimal bilateral atelectasis is noted. No pleural effusion or pneumothorax is seen. No masses are identified. Upper Abdomen: Scattered cystic foci are noted throughout the liver. The spleen is unremarkable. The gallbladder is grossly unremarkable. The visualized portions of the pancreas, adrenal glands and kidneys are within normal limits. Musculoskeletal: No acute osseous abnormalities are identified. There is chronic endplate degenerative change at the upper lumbar spine. The visualized musculature is unremarkable in appearance. Review of the MIP images confirms the above findings. IMPRESSION: 1. Previously noted bilateral pulmonary emboli have largely resolved, with minimal residual chronic nonobstructive embolus noted at the pulmonary artery to the left lower lobe. 2. Interval evolution of small pulmonary infarct at the right lower lung lobe. 3. Scattered coronary artery calcifications seen. 4. 1.6 cm hypodensity at the right thyroid lobe. Consider further evaluation with thyroid ultrasound. If patient is clinically hyperthyroid, consider nuclear medicine thyroid uptake and  scan. 5. Scattered cystic foci throughout the liver. Electronically Signed   By: Garald Balding M.D.   On: 01/23/2018 06:11   Ct Angio Chest Pe W And/or Wo Contrast  Result Date: 01/04/2018 CLINICAL DATA:  77 y/o F; generalized weakness. PE suspected, intermediate prob, positive D-dimer. EXAM: CT ANGIOGRAPHY CHEST WITH CONTRAST TECHNIQUE: Multidetector CT imaging of the chest was performed using the standard protocol during bolus administration of intravenous contrast. Multiplanar CT image reconstructions and MIPs were obtained to evaluate the vascular anatomy. CONTRAST:  60 cc Isovue 370 COMPARISON:  02/25/2016 CT angiogram of the chest. FINDINGS: Cardiovascular: Satisfactory opacification of the pulmonary arteries to the segmental level. Bilateral pulmonary emboli in the right main and bilateral segmental pulmonary arteries. No saddle embolus. RV/LV = 1.1. Stable mild cardiomegaly. No pericardial effusion. Mild coronary artery calcific atherosclerosis. Normal caliber main pulmonary artery and aorta. Mild aortic atherosclerosis. Mediastinum/Nodes: No enlarged mediastinal, hilar, or axillary lymph nodes. Thyroid gland, trachea, and esophagus demonstrate no significant findings. Lungs/Pleura: Centrally lucent wedge-shaped consolidation within the right lower lobe likely representing a pulmonary infarct. No additional consolidation, effusion, or pneumothorax. Upper Abdomen: Multiple small liver hypodensities likely representing cysts, a few are too small to characterize, stable from prior study. Musculoskeletal: No acute fracture. Mild-to-moderate discogenic degenerative changes  of the spine. Review of the MIP images confirms the above findings. IMPRESSION: 1. Positive for acute PE with CTevidence of right heart strain (RV/LV Ratio = 1.1) consistent with at least submassive (intermediate risk) PE. The presence of right heart strain has been associated with an increased risk of morbidity and mortality. Please  activate Code PE by paging [phone]. 2. Right lower lobe wedge-shaped consolidation likely representing a pulmonary infarct. 3. Mild coronary and aortic calcific atherosclerosis. Critical Value/emergent results were called by telephone at the time of interpretation on 01/04/2018 at 7:41 pm to Dr. Blanchie Dessert , who verbally acknowledged these results. Electronically Signed   By: Kristine Garbe M.D.   On: 01/04/2018 19:42   Vas [place] Lower Extremity Venous (dvt)  Result Date: 01/06/2018  Lower Venous Study Indications: Pain.  Limitations: Very sensitive for transducer, patient has very limited tolerance to compression. Performing Technologist: Lorina Rabon  Examination Guidelines: A complete evaluation includes B-mode imaging, spectral Doppler, color Doppler, and power Doppler as needed of all accessible portions of each vessel. Bilateral testing is considered an integral part of a complete examination. Limited examinations for reoccurring indications may be performed as noted.  Right Venous Findings: +---------+---------------+---------+-----------+----------+---------+          CompressibilityPhasicitySpontaneityPropertiesSummary   +---------+---------------+---------+-----------+----------+---------+ CFV      Full           Yes      Yes                  poor flow +---------+---------------+---------+-----------+----------+---------+ SFJ      Full                                                   +---------+---------------+---------+-----------+----------+---------+ FV Prox  Partial                                      Acute     +---------+---------------+---------+-----------+----------+---------+ FV Mid   Full                                                   +---------+---------------+---------+-----------+----------+---------+ FV DistalFull           Yes      Yes                             +---------+---------------+---------+-----------+----------+---------+ PFV      Full                                                   +---------+---------------+---------+-----------+----------+---------+ POP      None                                         Acute     +---------+---------------+---------+-----------+----------+---------+  Right Technical Findings: Not visualized segments include calf veins. patient refused to exam calf veins. patient  was extremely sensitive for compression, exam cannot approach all segments of calf veins. Could not exclude possible calf veins thrombosis existence.  Left Venous Findings: +---------+---------------+---------+-----------+----------------------+-------+          CompressibilityPhasicitySpontaneityProperties            Summary +---------+---------------+---------+-----------+----------------------+-------+ CFV      Partial                                                  Acute   +---------+---------------+---------+-----------+----------------------+-------+ SFJ      Partial                                                  Acute   +---------+---------------+---------+-----------+----------------------+-------+ FV Prox  Partial                            partially             Acute                                               re-cannalized                 +---------+---------------+---------+-----------+----------------------+-------+ FV Mid   Partial                                                  Acute   +---------+---------------+---------+-----------+----------------------+-------+ FV DistalPartial                                                          +---------+---------------+---------+-----------+----------------------+-------+ POP      Partial        No       No                               Acute   +---------+---------------+---------+-----------+----------------------+-------+  Left  Technical Findings: Not visualized segments include calf veins. patient was extremely sensitive for compression, exam cannot approach all segments of calf veins. Could not exclude possible calf veins thrombosis existence.   Summary: Right: Findings consistent with acute deep vein thrombosis involving the right femoral vein, and right popliteal vein. Left: Findings consistent with acute deep vein thrombosis involving the left common femoral vein, left femoral vein, and left popliteal vein.  *See table(s) above for measurements and observations. Electronically signed by Curt Jews MD on 01/06/2018 at 7:12:08 PM.    Final     Micro Results     No results found for this or any previous visit (from the past 240 hour(s)).     Today   Subjective:   Samyia Motter today has no headache,no chest abdominal pain,no new weakness tingling or numbness, feels much better  wants  today.   Objective:   Blood pressure 132/80, pulse (!) 104, temperature 98.1 F (36.7 C), temperature source Oral, resp. rate 16, height 5\' 4"  (1.626 m), weight 71.9 kg, SpO2 98 %.   Intake/Output Summary (Last 24 hours) at 01/25/2018 1251 Last data filed at 01/25/2018 0657 Gross per 24 hour  Intake 537.35 ml  Output 300 ml  Net 237.35 ml    Exam Awake Alert, Oriented x 3, No new F.N deficits, Normal affect  Symmetrical Chest wall movement, Good air movement bilaterally, CTAB RRR,No Gallops,Rubs or new Murmurs, No Parasternal Heave +ve B.Sounds, Abd Soft, Non tender,No rebound -guarding or rigidity. No Cyanosis, Clubbing or edema, No new Rash or bruise  Data Review   CBC w Diff:  Lab Results  Component Value Date   WBC 7.6 01/25/2018   HGB 8.7 (L) 01/25/2018   HCT 28.4 (L) 01/25/2018   PLT 193 01/25/2018   LYMPHOPCT 23 01/23/2018   MONOPCT 6 01/23/2018   EOSPCT 0 01/23/2018   BASOPCT 1 01/23/2018    CMP:  Lab Results  Component Value Date   NA 138 01/25/2018   NA 140 09/19/2016   K 3.5 01/25/2018    CL 109 01/25/2018   CO2 24 01/25/2018   BUN 11 01/25/2018   BUN 7 (L) 09/19/2016   CREATININE 0.80 01/25/2018   PROT 6.1 (L) 01/23/2018   PROT 6.7 09/19/2016   ALBUMIN 3.3 (L) 01/23/2018   ALBUMIN 3.9 09/19/2016   BILITOT 0.5 01/23/2018   BILITOT 0.3 09/19/2016   ALKPHOS 67 01/23/2018   AST 25 01/23/2018   ALT 27 01/23/2018  .   Total Time in preparing paper work, data evaluation and todays exam - 59 minutes  Phillips Climes M.D on 01/25/2018 at 12:51 PM  [facility]   Office  [phone]

## 2018-01-25 NOTE — Discharge Instructions (Signed)
Follow with Primary MD [name], MD in 7 days   Get CBC, CMP,  checked  by Primary MD next visit.    Activity: As tolerated with Full fall precautions use walker/cane & assistance as needed   Disposition SNF   Diet: Regular diet with feeding assistance and aspiration precautions.  For Heart failure patients - Check your Weight same time everyday, if you gain over 2 pounds, or you develop in leg swelling, experience more shortness of breath or chest pain, call your Primary MD immediately. Follow Cardiac Low Salt Diet and 1.5 lit/day fluid restriction.   On your next visit with your primary care physician please Get Medicines reviewed and adjusted.   Please request your Prim.MD to go over all Hospital Tests and Procedure/Radiological results at the follow up, please get all Hospital records sent to your Prim MD by signing hospital release before you go home.   If you experience worsening of your admission symptoms, develop shortness of breath, life threatening emergency, suicidal or homicidal thoughts you must seek medical attention immediately by calling 911 or calling your MD immediately  if symptoms less severe.  You Must read complete instructions/literature along with all the possible adverse reactions/side effects for all the Medicines you take and that have been prescribed to you. Take any new Medicines after you have completely understood and accpet all the possible adverse reactions/side effects.   Do not drive, operating heavy machinery, perform activities at heights, swimming or participation in water activities or provide baby sitting services if your were admitted for syncope or siezures until you have seen by Primary MD or a Neurologist and advised to do so again.  Do not drive when taking Pain medications.    Do not take more than prescribed Pain, Sleep and Anxiety Medications  Special Instructions: If you have smoked or chewed Tobacco  in the last 2 yrs please stop  smoking, stop any regular Alcohol  and or any Recreational drug use.  Wear Seat belts while driving.   Please note  You were cared for by a hospitalist during your hospital stay. If you have any questions about your discharge medications or the care you received while you were in the hospital after you are discharged, you can call the unit and asked to speak with the hospitalist on call if the hospitalist that took care of you is not available. Once you are discharged, your primary care physician will handle any further medical issues. Please note that NO REFILLS for any discharge medications will be authorized once you are discharged, as it is imperative that you return to your primary care physician (or establish a relationship with a primary care physician if you do not have one) for your aftercare needs so that they can reassess your need for medications and monitor your lab values.

## 2018-02-05 ENCOUNTER — Other Ambulatory Visit: Payer: Self-pay | Admitting: *Deleted

## 2018-02-05 NOTE — Patient Outreach (Signed)
[facility] [facility]) Care Management  02/05/2018  Denise Kelly Mar 14, 1941 [id number]   Telephone Screen  Referral Date:[facility] RN CM received on 02/02/18  Referral Source: [facility] referral  Referral Reason:Member dc'ing from snf stay 01/30/18 Member had 2 inpatient hospitalizations and 2 snf admissions recently Does have a hx of anxiety/depression.  Does have strong support in the home (T [employer] Insurance: Medicare and blue cross and blue shield supplement  Patient returned a call to [place] CM Patient is able to partially verify HIPAA (no DOB) Reviewed and addressed referral to [facility] with patient She is very brief with her answers to Transition of care services and informs CM she is doing okay since her discharge from snf ([facility]), she has her d/c instructions, understood them and "got all I need" [facility] RN CM discussed the unsuccessful letter sent to her today with the [facility] brochure for review She answered a few more questions, thanked CM for calling and disconnected the call     Social: "I'm doing good"  Conditions: GERD, constipation , allergies HTN, LAFB, fall recurrent UTI, RLS, depression,  chronic pain syndrome alcohol dependence, s/p TKA hx of pulmonary embolus  Medications: denies concerns with taking medications as prescribed, affording medications, side effects of medications and questions about medications  Appointments: Advance Directives: She has a MOST form completed  Consent: [facility] RN CM reviewed [facility] services with patient. Patient gave verbal consent for services.  [facility] RN CM will close case at this time as patient has been assessed and no needs identified.    Pt encouraged to return a call to [facility] RN CM prn  Kimberly L. Lavina Hamman, RN, BSN, [place] Coordinator Office number [phone] Mobile number [phone]  Main [facility] number [phone] Fax number [phone]

## 2018-02-05 NOTE — Patient Outreach (Addendum)
[facility] [facility] [facility]) Care Management  02/05/2018  NEYTIRI ASCHE 08-Nov-1940 [id number]   Telephone Screen  Referral Date:[facility] RN CM received on 02/02/18  Referral Source: [facility] referral  Referral Reason:Member dc'ing from snf stay 01/30/18 Member had 2 inpatient hospitalizations and 2 snf admissions recently Does have a hx of anxiety/depression.  Does have strong support in the home (T [place] Insurance: Medicare and blue cross and blue shield supplement   Outreach attempt # 1 No answer. [facility] RN CM left HIPAA compliant voicemail message along with CM's contact info.   Plan: [facility] RN CM sent an unsuccessful outreach letter and scheduled this patient for another call attempt within 4 business days  Wilson Dusenbery L. Lavina Hamman, RN, BSN, [place] Coordinator Office number [phone] Mobile number [phone]  Main [facility] number [phone] Fax number [phone]

## 2018-02-08 ENCOUNTER — Ambulatory Visit: Payer: Self-pay | Admitting: *Deleted

## 2018-04-23 IMAGING — US US ABDOMEN LIMITED
1 series · 13 of 25 positions shown · non-contrast
Comparison: Limited abdominal ultrasound December 02, 2014 ;
abdominal MRI of June 29, 2012

CLINICAL DATA: Postprandial right upper quadrant discomfort

EXAM:
US ABDOMEN LIMITED - RIGHT UPPER QUADRANT

[Series 1: us abdomen limited · 0.32mm/px · 13 of 43 slices shown]
[im 1/43]
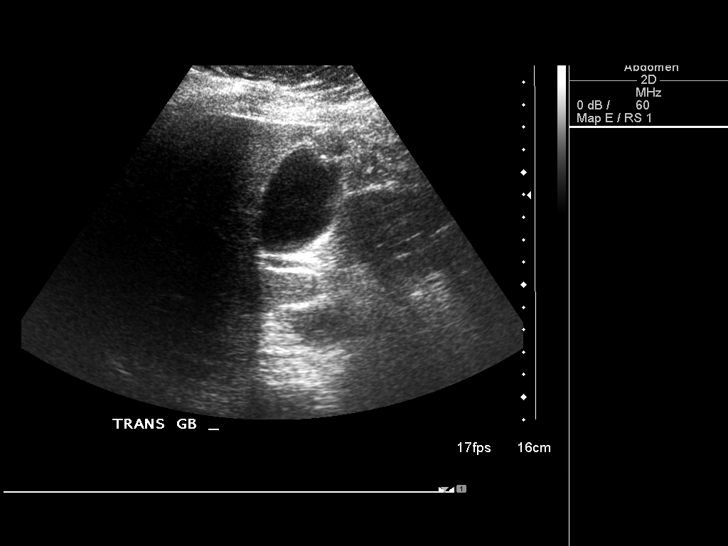
[im 4/43]
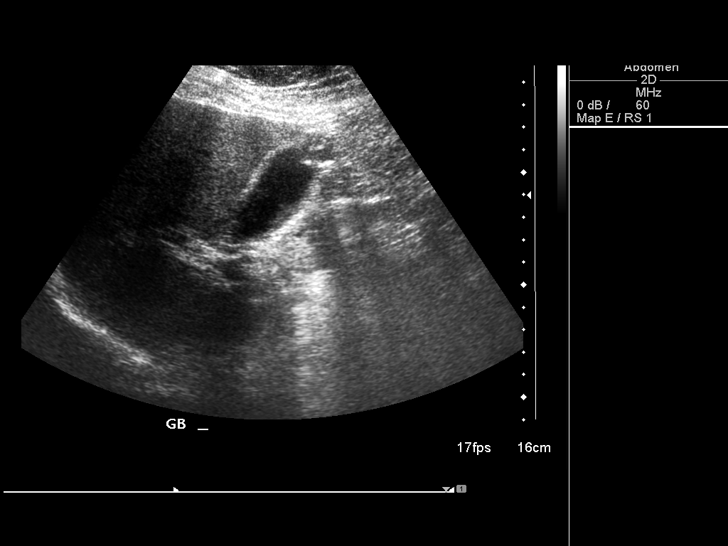
[im 8/43]
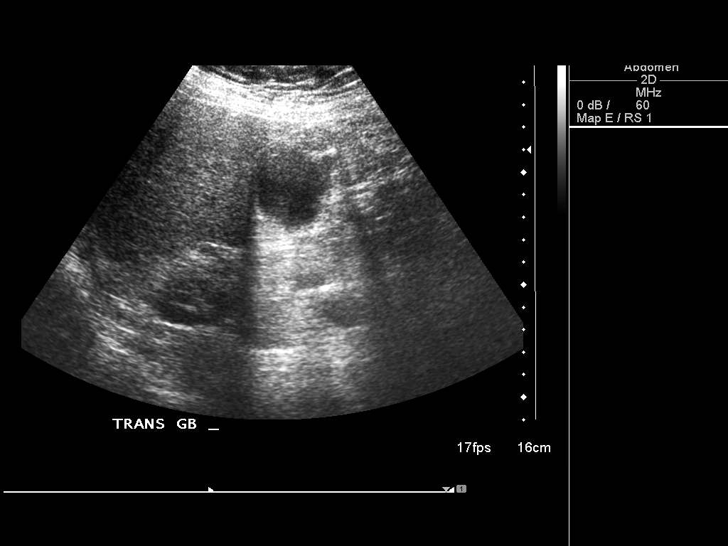
[im 11/43]
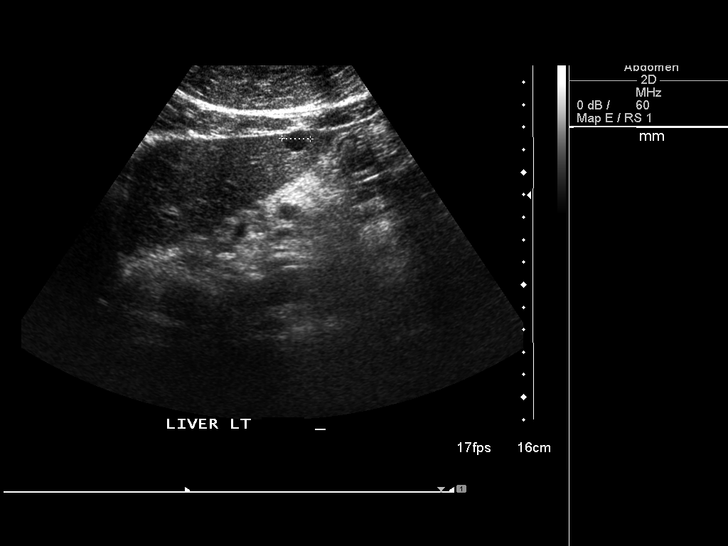
[im 15/43]
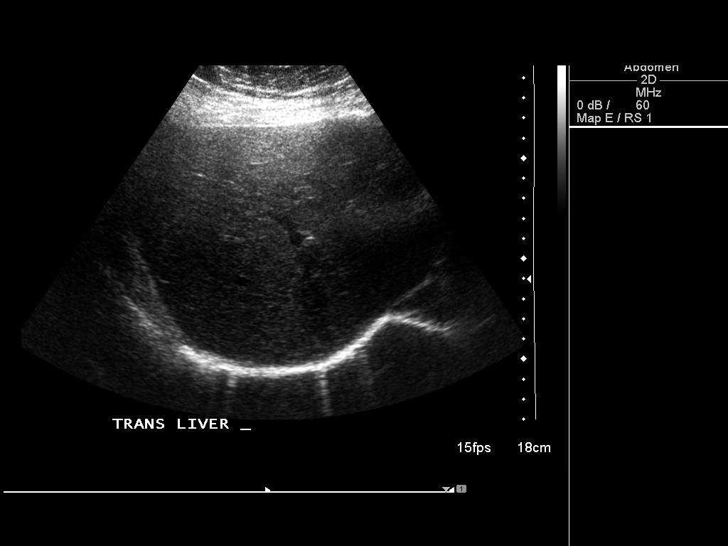
[im 18/43]
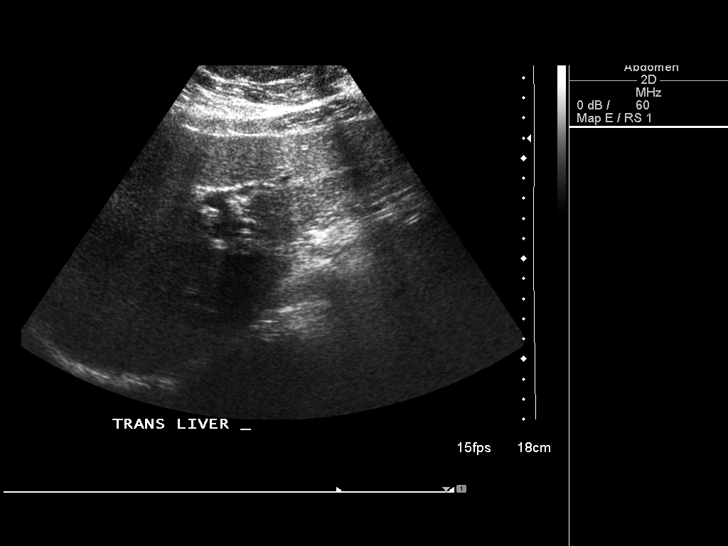
[im 22/43]
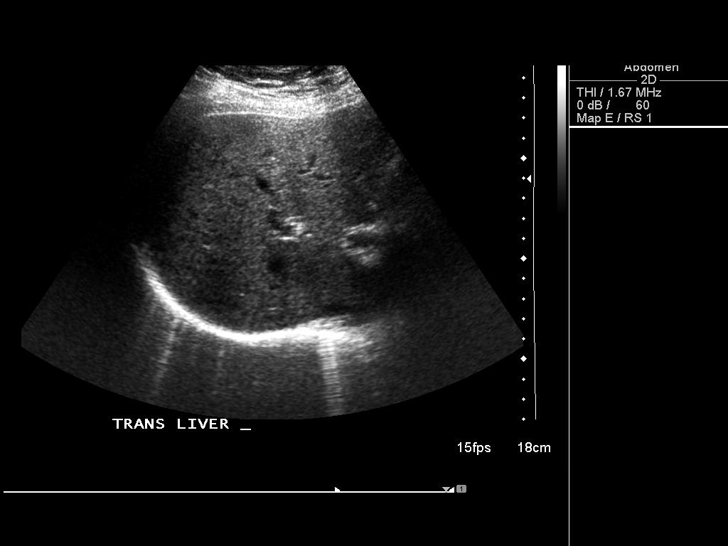
[im 25/43]
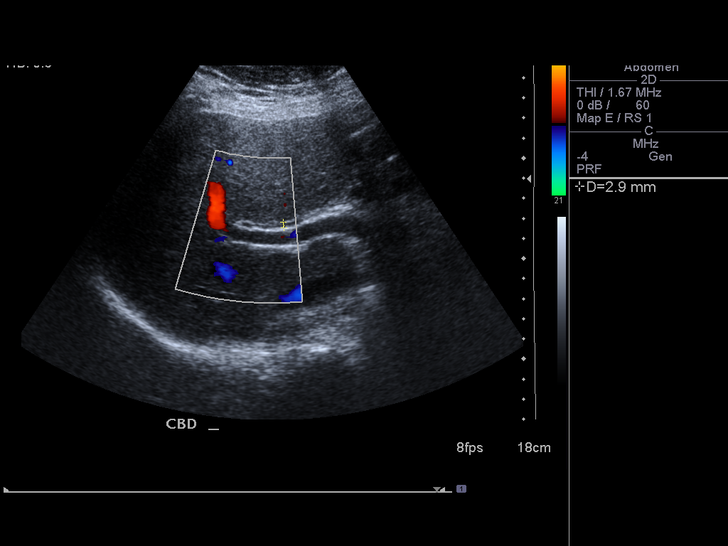
[im 29/43]
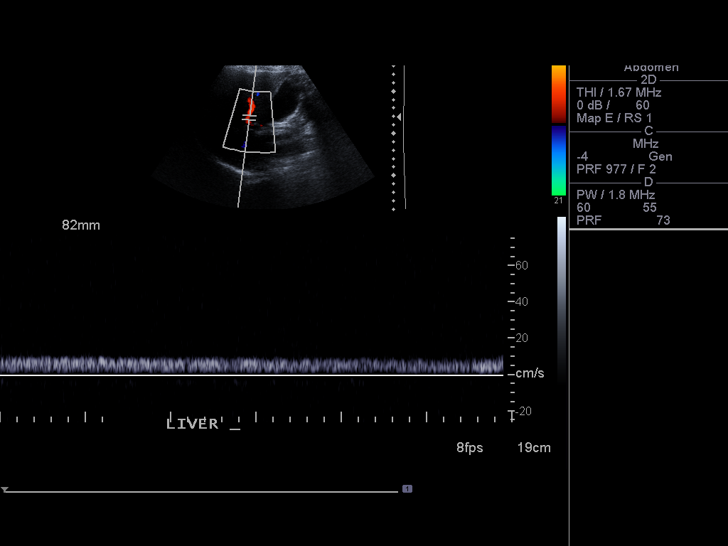
[im 32/43]
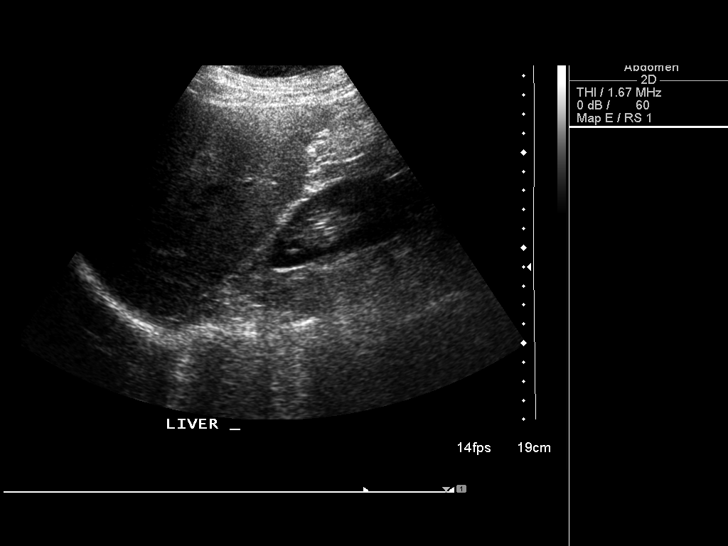
[im 36/43]
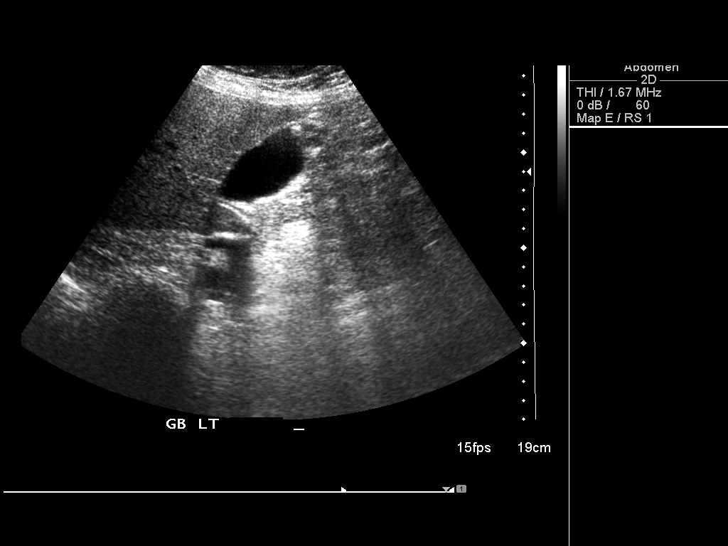
[im 39/43]
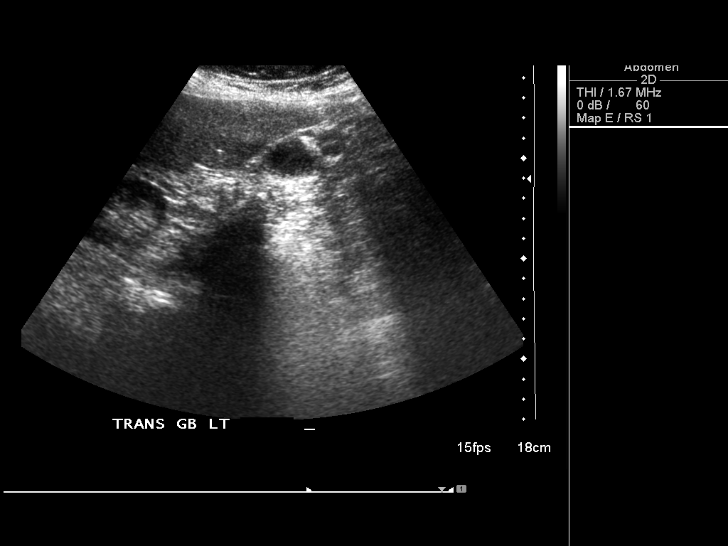
[im 43/43]
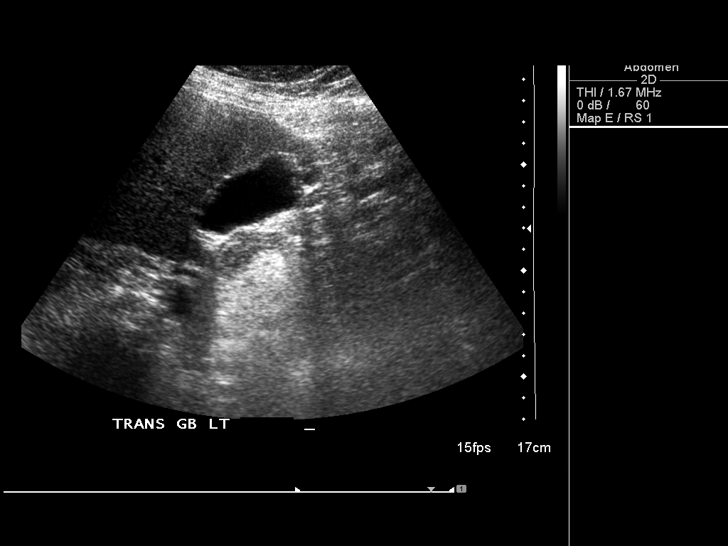

[13 of 25 positions shown; findings below may reference images not displayed]

FINDINGS: Gallbladder:

Again demonstrated is increased soft tissue echotexture material in
the region of the gallbladder fundus. This does not appear to have
significantly changed. There is an echogenic focus near the
gallbladder fundus that may reflect a 5 mm stone. The gallbladder
wall is minimally thickened at 3.2 mm. There is no pericholecystic
fluid. A positive sonographic Murphy's sign is reported by the
technologist.

Common bile duct:

Diameter: 5.3 mm

Liver:

The hepatic echotexture is normal. In the left hepatic lobe there is
a subcapsular cyst measuring 1.8 x 1.2 x 1.3 cm with a partial
septation or tiny nodule. This was visible on the previous
ultrasound and MRI. There is no intrahepatic ductal dilation.
IMPRESSION: 1. Stable soft tissue masslike density in the gallbladder fundus
which is likely reflective of a Phrygian cap and focal
adenomyomatosis. There may be a 5 mm associated gallstone. A
positive sonographic Murphy's sign is reported. Correlation with
patient's clinical and laboratory values will be needed.
2. Normal appearance of the common bile duct.
3. Stable appearance of a partially septated cyst in the left
hepatic lobe.

## 2018-07-17 IMAGING — MR MR LUMBAR SPINE W/O CM
4 of 7 series · 18 of 48 positions shown · non-contrast
Comparison: MRI of the abdomen 06/29/2012.

CLINICAL DATA: Low back and left hip pain since left a hip
replacement due to fracture 2 months ago.

EXAM:
MRI LUMBAR SPINE WITHOUT CONTRAST
TECHNIQUE: Multiplanar, multisequence MR imaging of the lumbar spine was
performed. No intravenous contrast was administered.

[Series 3: T1 · sagittal · 4.0mm · 0.51mm/px · 3 of 14 slices shown (1 of 2)]
[im 1/14]
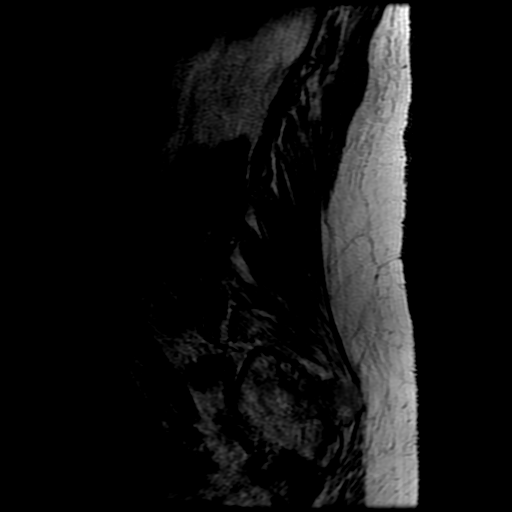
[im 9/14]
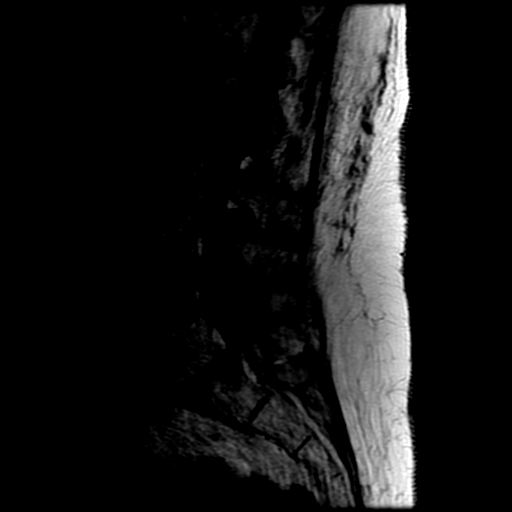
[im 14/14]
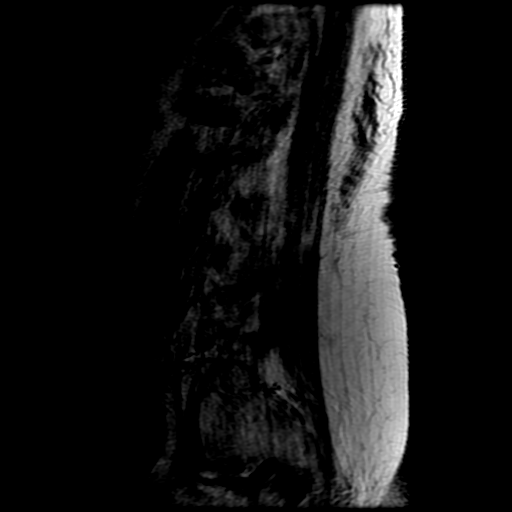

[Series 4: T2 post-contrast · sagittal · 4.0mm · 0.51mm/px · 5 of 14 slices shown]
[im 1/14]
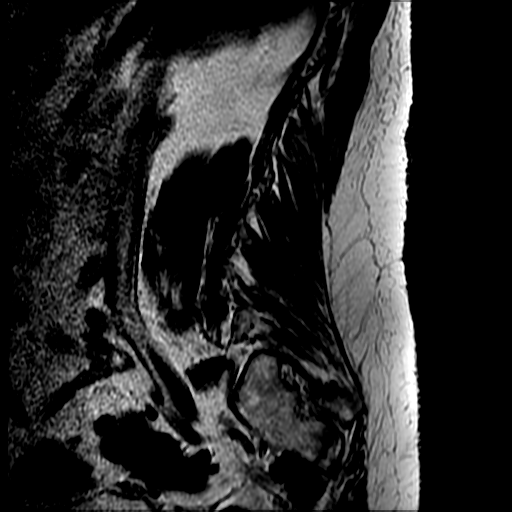
[im 4/14]
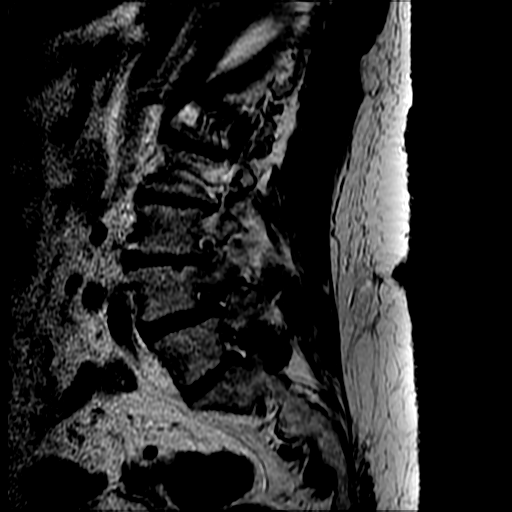
[im 7/14]
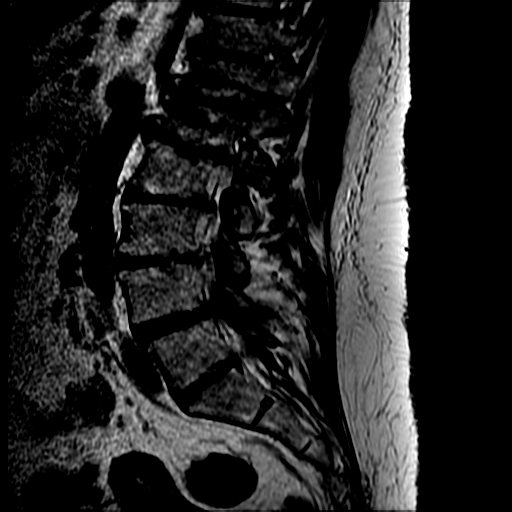
[im 10/14]
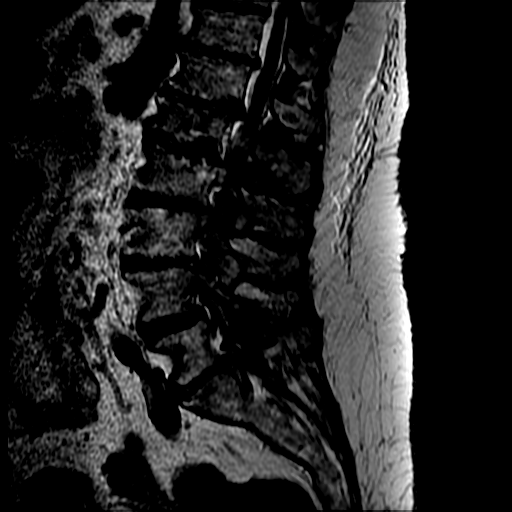
[im 14/14]
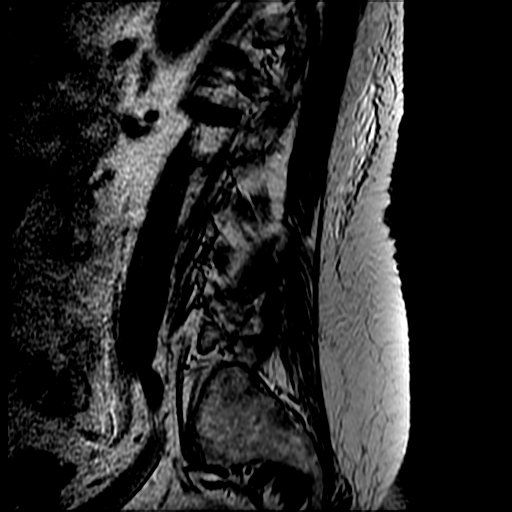

[Series 5: T1 · sagittal · 4.0mm · 0.51mm/px · 3 of 14 slices shown (2 of 2)]
[im 1/14]
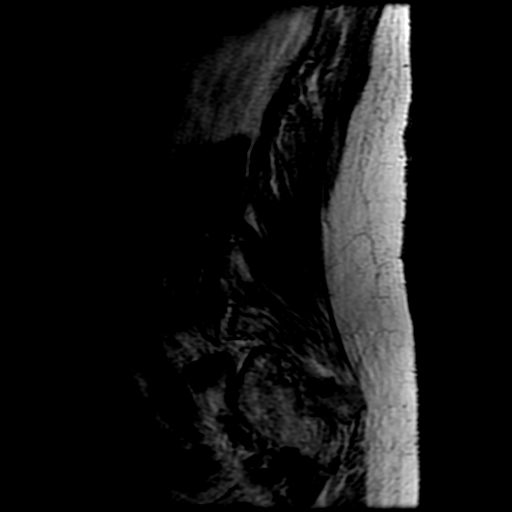
[im 7/14]
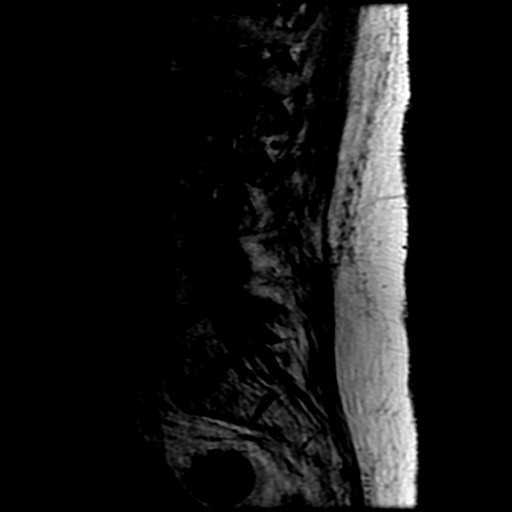
[im 14/14]
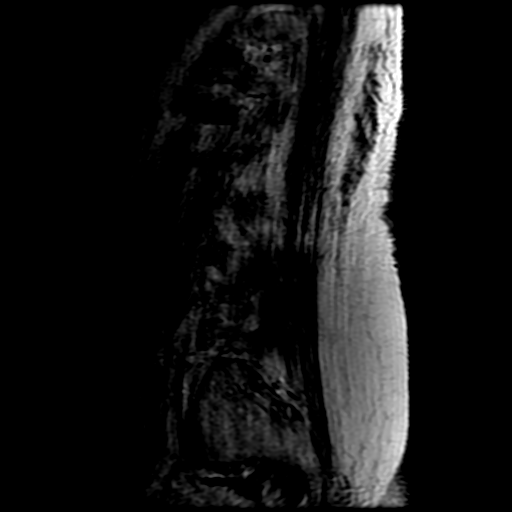

[Series 7: T2 · axial · 4.0mm · 0.41mm/px · z∈[-4,+181]mm · 7 of 38 slices shown]
[im 1/38]
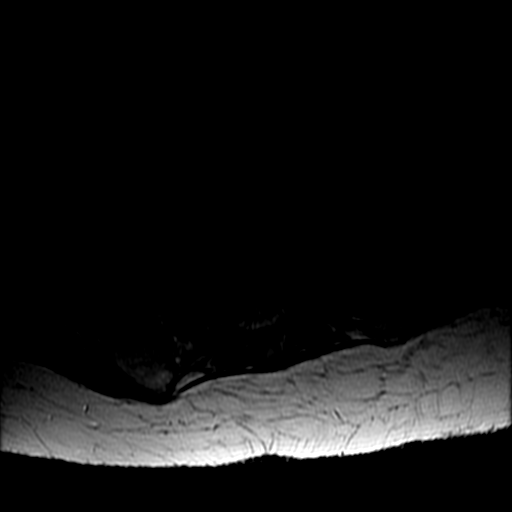
[im 7/38]
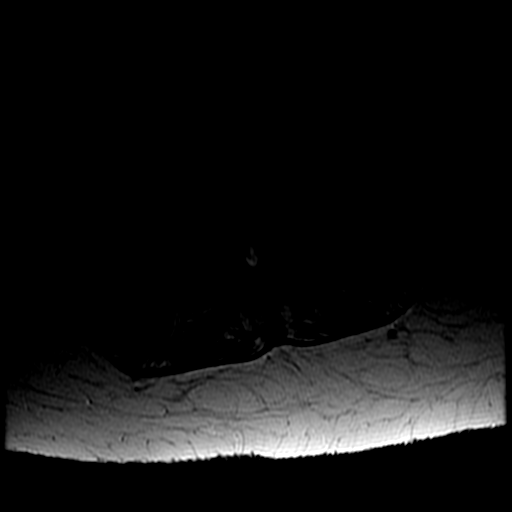
[im 11/38]
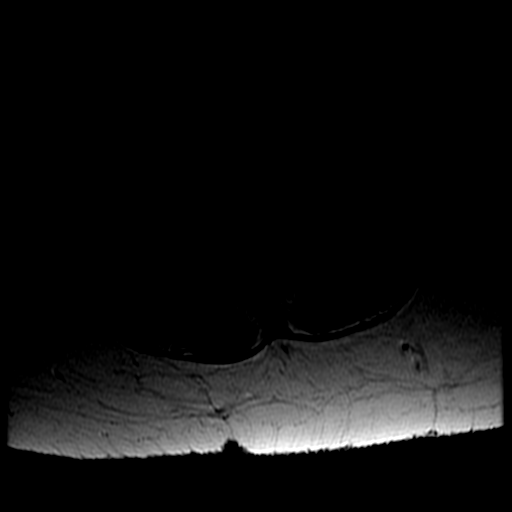
[im 17/38]
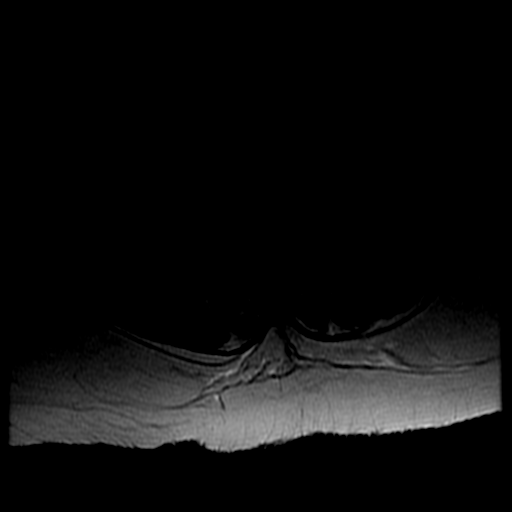
[im 21/38]
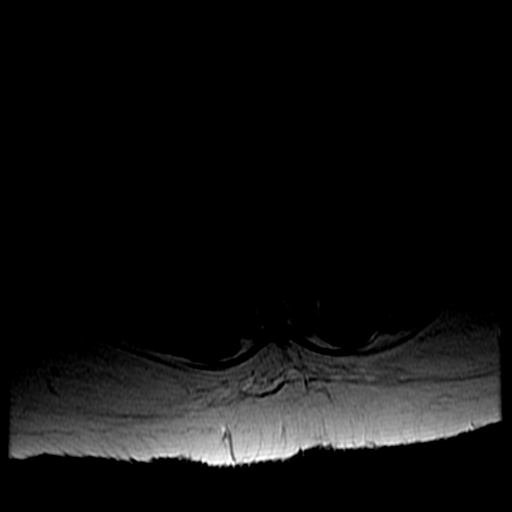
[im 27/38]
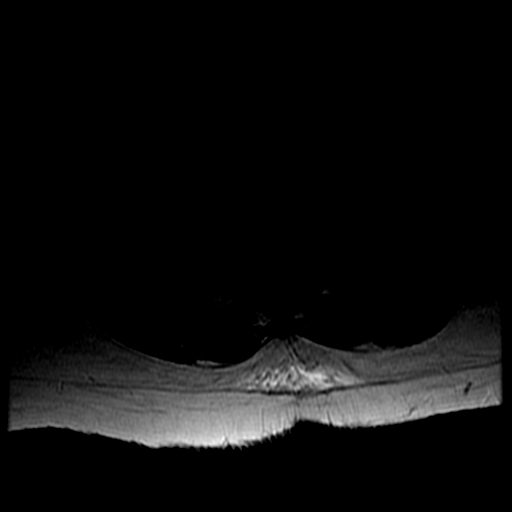
[im 34/38]
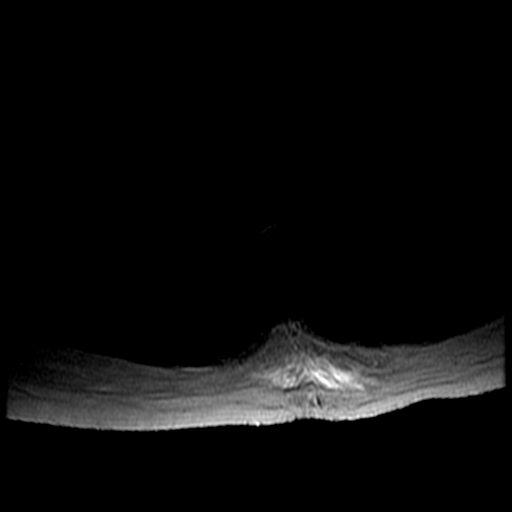

[18 of 48 positions shown; findings below may reference images not displayed]

FINDINGS: Segmentation:  Standard.

Alignment: There is convex right scoliosis. Facet degenerative
change results in 0.3 cm anterolisthesis L4 on L5. 0.3 cm
degenerative retrolisthesis L1 on L2 is also identified.

Vertebrae: No fracture or worrisome lesion. Multilevel degenerative
endplate signal change is seen.

Conus medullaris: Extends to the T12-L1 level and appears normal.

Paraspinal and other soft tissues: 0.7 cm T2 hyperintense lesion in
the right hepatic lobe is unchanged since the comparison study.
Otherwise unremarkable.

Disc levels:

Patient motion on all sequences degrades the exam.

T10-11 is imaged in the sagittal plane only. There is a minimal disc
bulge without central canal or foraminal narrowing.

T11-12: Shallow disc bulge and mild facet degenerative change. The
central canal and foramina appear open.

T12-L1: Disc bulge and endplate spur cause mild central canal
narrowing. The left foramen is open. Moderate to moderately severe
right foraminal stenosis is identified.

L1-2: Shallow disc bulge and endplate spur are seen. There is
ligamentum flavum thickening. Mild to moderate central canal
narrowing is identified. The foramina appear mildly narrowed.

L2-3: There is a disc bulge with endplate spur causing mild to
moderate central canal stenosis. Mild bilateral foraminal narrowing
is worse on the left.

L3-4: Mild disc bulge with endplate spur, ligamentum flavum
thickening and mild to moderate facet arthropathy are seen. Moderate
central canal and bilateral subarticular recess stenosis is present.
The foramina appear open.

L4-5: There is advanced facet degenerative change and bulky
ligamentum flavum thickening. The disc is uncovered with a shallow
bulge. Severe central canal and bilateral subarticular recess
stenosis is present. Moderate to moderately severe foraminal
narrowing is worse on the right.

L5-S1: There is a shallow disc bulge and small protrusion in the
left foramen. The central canal and foramina appear open.
IMPRESSION: Motion degraded examination demonstrating multilevel spondylosis
appearing worst at L4-5 where there is severe central canal and
bilateral subarticular recess narrowing. Advanced facet degenerative
change at this level results in 0.3 cm anterolisthesis. Please see
above for descriptions of individual levels.

## 2018-09-24 ENCOUNTER — Other Ambulatory Visit: Payer: Self-pay

## 2018-09-24 DIAGNOSIS — R600 Localized edema: Secondary | ICD-10-CM

## 2018-10-01 ENCOUNTER — Encounter: Payer: Self-pay | Admitting: Vascular Surgery

## 2018-10-01 ENCOUNTER — Ambulatory Visit (HOSPITAL_COMMUNITY)
Admission: RE | Admit: 2018-10-01 | Discharge: 2018-10-01 | Disposition: A | Discharge status: Home / Self Care | Payer: Medicare Other | Source: Ambulatory Visit | Attending: Family | Admitting: Family

## 2018-10-01 ENCOUNTER — Ambulatory Visit (INDEPENDENT_AMBULATORY_CARE_PROVIDER_SITE_OTHER): Payer: Medicare Other | Admitting: Vascular Surgery

## 2018-10-01 ENCOUNTER — Other Ambulatory Visit: Payer: Self-pay

## 2018-10-01 VITALS — BP 137/80 | HR 90 | Temp 97.6°F | Ht 64.0 in | Wt 194.0 lb

## 2018-10-01 DIAGNOSIS — R600 Localized edema: Secondary | ICD-10-CM

## 2018-10-01 NOTE — Progress Notes (Signed)
Patient ID: Denise Kelly, female   DOB: 1940/05/21, 78 y.o.   MRN: [id number]  Reason for Consult: Follow-up   Referred by [name], MD  Subjective:     HPI:  Denise Kelly is a 78 y.o. female previously seen in the hospital in October 2019 and previously my office in 2018.  She has had submassive PE was placed on anticoagulation.  Now has bilateral lower extremity swelling.  States that the swelling is quite uncomfortable for her.  She does not wear compression stockings because this would be too hard for her.  She does take Eliquis.  She does have some weeping from the right leg she is wearing a Band-Aid today.  She does walk with help of walker.  Visit is with the daughter on speaker phone.  Past Medical History:  Diagnosis Date   Alcohol abuse    ETOH and xanax   Anxiety    Panic attacks    Arthritis    Cancer ([place])    skin- basal , squamous , melonoma- right hand   Cellulitis    right leg   Complication of anesthesia 2009   "felt drunk for a week after"- AVM- both times- felt drunk   Depression    Encephalopathy    GERD (gastroesophageal reflux disease)    HOH (hard of hearing)    HOH (hard of hearing)    Hypertension    not on mediacations   Palpitations    PONV (postoperative nausea and vomiting)    Spondylolisthesis of lumbosacral region    UTI (urinary tract infection)    frequently   Family History  Problem Relation Age of Onset   Hypertension Other    Cancer Mother    Cancer Father    Past Surgical History:  Procedure Laterality Date   BIOPSY  01/24/2018   Procedure: BIOPSY;  Surgeon: Ronnette Juniper, MD;  Location: [facility] ENDOSCOPY;  Service: Gastroenterology;;   BREAST SURGERY Right    2 breast biopsies   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   COLONOSCOPY  2011   polyps   ESOPHAGOGASTRODUODENOSCOPY (EGD) WITH PROPOFOL N/A 01/24/2018   Procedure: ESOPHAGOGASTRODUODENOSCOPY (EGD) WITH PROPOFOL;  Surgeon: Ronnette Juniper, MD;  Location: [facility] ENDOSCOPY;  Service: Gastroenterology;  Laterality: N/A;   EYE SURGERY Bilateral    cataracts   INTRAMEDULLARY (IM) NAIL INTERTROCHANTERIC Left 11/29/2015   Procedure: LEFT HIP   NAIL;  Surgeon: Renette Butters, MD;  Location: [place];  Service: Orthopedics;  Laterality: Left;   JOINT REPLACEMENT Left 09/2009   knee   LUMBAR FUSION  07/17/2016   Lumbar four-five Posterior lumbar interbody fusion (N/A)   TONSILLECTOMY     TOTAL KNEE ARTHROPLASTY Right 01/02/2014   Procedure: TOTAL KNEE ARTHROPLASTY;  Surgeon: [name], MD;  Location: [place];  Service: Orthopedics;  Laterality: Right;   TUBAL LIGATION      Short Social History:  Social History   Tobacco Use   Smoking status: Former Smoker    Packs/day: 1.00    Years: 20.00    Pack years: 20.00    Quit date: 03/17/1992    Years since quitting: 26.5   Smokeless tobacco: Never Used   Tobacco comment: quit smoking many years ago .  quit 1994  Substance Use Topics   Alcohol use: No    Comment: unable to get alcohol per family not since September    Allergies  Allergen Reactions   Sulfa Antibiotics Hives,  Swelling and Other (See Comments)    Reaction:  Facial/eye swelling    Coreg [Carvedilol] Other (See Comments)    Memory loss   Motrin [Ibuprofen] Palpitations   Toprol Xl [Metoprolol Tartrate] Cough   Doxycycline Hyclate Other (See Comments)    HEARTBURN   Flovent Hfa [Fluticasone] Itching   Statins Other (See Comments)    MENTAL STATUS CHANGE    Current Outpatient Medications  Medication Sig Dispense Refill   acetaminophen (TYLENOL) 325 MG tablet Take 2 tablets (650 mg total) by mouth every 6 (six) hours as needed for mild pain.     acidophilus (RISAQUAD) CAPS capsule Take 2 capsules by mouth daily. 30 capsule 0   estradiol (ESTRACE) 0.1 MG/GM vaginal cream Place 1 Applicatorful vaginally 2 (two) times a week.      ferrous sulfate 325 (65 FE) MG EC tablet Take 1 tablet (325 mg  total) by mouth 2 (two) times daily. 60 tablet 3   gabapentin (NEURONTIN) 100 MG capsule Take 2 capsules (200 mg total) by mouth 2 (two) times daily. 6 capsule 0   hydrOXYzine (ATARAX/VISTARIL) 25 MG tablet Take 25 mg by mouth at bedtime.      loperamide (IMODIUM) 2 MG capsule Take 1 capsule (2 mg total) by mouth 2 (two) times daily as needed for diarrhea or loose stools. 30 capsule 0   ondansetron (ZOFRAN-ODT) 4 MG disintegrating tablet Take 4 mg by mouth every 8 (eight) hours as needed for nausea.      pantoprazole (PROTONIX) 40 MG tablet Take 1 tablet (40 mg total) by mouth 2 (two) times daily before a meal.     polyethylene glycol (MIRALAX / GLYCOLAX) packet Take 17 g by mouth daily.     senna-docusate (SENOKOT-S) 8.6-50 MG tablet Take 1 tablet by mouth daily as needed for mild constipation.     spironolactone (ALDACTONE) 50 MG tablet Take 50 mg by mouth 2 (two) times daily.      traZODone (DESYREL) 50 MG tablet Take 1 tablet (50 mg total) by mouth daily as needed for sleep. (Patient taking differently: Take 50 mg by mouth at bedtime. ) 4 tablet 0   Zinc Oxide (TRIPLE PASTE) 12.8 % ointment Apply topically as needed for irritation. (Patient taking differently: Apply 1 application topically daily as needed for irritation. ) 56.7 g 0   apixaban (ELIQUIS) 5 MG TABS tablet Take 1 tablet (5 mg total) by mouth 2 (two) times daily. 60 tablet 0   loratadine (CLARITIN) 10 MG tablet Take 1 tablet (10 mg total) by mouth daily. 30 tablet 0   No current facility-administered medications for this visit.     Review of Systems  Constitutional:  Constitutional negative. HENT: HENT negative.  Eyes: Eyes negative.  Cardiovascular: Positive for leg swelling.  GI: Gastrointestinal negative.  Musculoskeletal: Positive for leg pain.  Skin: Positive for rash.  Neurological: Neurological negative. Hematologic: Positive for bruises/bleeds easily.        Objective:  Objective   Vitals:    10/01/18 1212  BP: 137/80  Pulse: 90  Temp: 97.6 F (36.4 C)  SpO2: 98%  Weight: 194 lb (88 kg)  Height: 5\' 4"  (1.626 m)   Body mass index is 33.3 kg/m.  Physical Exam HENT:     Head: Normocephalic.     Mouth/Throat:     Mouth: Mucous membranes are moist.  Eyes:     Pupils: Pupils are equal, round, and reactive to light.  Cardiovascular:     Rate and  Rhythm: Normal rate.  Abdominal:     General: Abdomen is flat.  Musculoskeletal:     Right lower leg: Edema present.     Left lower leg: Edema present.     Comments: There is some weeping from the right leg  Skin:    Capillary Refill: Capillary refill takes less than 2 seconds.  Neurological:     Mental Status: She is alert.  Psychiatric:        Mood and Affect: Mood normal.        Behavior: Behavior normal.        Thought Content: Thought content normal.        Judgment: Judgment normal.     Data: I have independently interpreted her bilateral extremity venous reflux.  Demonstrates saphenofemoral junction on the right 0.62 and left 0.68.  Really only has reflux 600 on the right and 500 on the left at the saphenofemoral junction.  Her saphenous veins are much smaller down the knee is on the right 0.24 and not visualized beyond that left 0.38 and below the knee 0.15 cm.     Assessment/Plan:     78 year old female presents for bilateral lower extremity edema.  Has a history of DVT.  Minimal reflux in the bilateral lower extremities and saphenous veins are actually quite small which would render saphenous vein ablation nearly impossible.  She does need to get in compression and this will be difficult for her.  I referred them to elastic therapy.  She also takes Lasix she should be compliant with this.  As long she is not having bleeding episodes would recommend she continue Eliquis and she can follow-up here on an as-needed basis.     Waynetta Sandy MD [facility]

## 2019-01-26 ENCOUNTER — Ambulatory Visit: Payer: Medicare Other | Admitting: Podiatry

## 2019-05-03 ENCOUNTER — Other Ambulatory Visit: Payer: Self-pay | Admitting: Physician Assistant

## 2019-05-03 DIAGNOSIS — Z86010 Personal history of colonic polyps: Secondary | ICD-10-CM

## 2019-06-21 ENCOUNTER — Other Ambulatory Visit: Payer: Medicare Other

## 2019-07-21 ENCOUNTER — Telehealth: Payer: Self-pay | Admitting: *Deleted

## 2019-07-21 NOTE — Telephone Encounter (Signed)
Returning patient call.  Not able to speak to patient. Left telephone voice message that I would call her back when I return to the office on Tuesday, May 11.  Advised her if urgent issue to call back tomorrow and ask to speak to triage nurse.

## 2019-08-19 ENCOUNTER — Inpatient Hospital Stay: Admission: RE | Admit: 2019-08-19 | Payer: Medicare Other | Source: Ambulatory Visit

## 2019-09-08 ENCOUNTER — Other Ambulatory Visit: Payer: Medicare Other

## 2019-09-09 ENCOUNTER — Inpatient Hospital Stay: Admission: RE | Admit: 2019-09-09 | Payer: Medicare Other | Source: Ambulatory Visit

## 2019-09-30 ENCOUNTER — Ambulatory Visit
Admission: RE | Admit: 2019-09-30 | Discharge: 2019-09-30 | Disposition: A | Discharge status: Home / Self Care | Payer: Medicare Other | Source: Ambulatory Visit | Attending: Physician Assistant | Admitting: Physician Assistant

## 2019-09-30 DIAGNOSIS — Z86010 Personal history of colonic polyps: Secondary | ICD-10-CM

## 2019-10-07 ENCOUNTER — Other Ambulatory Visit: Payer: Medicare Other

## 2020-08-03 DIAGNOSIS — F3342 Major depressive disorder, recurrent, in full remission: Secondary | ICD-10-CM | POA: Diagnosis not present

## 2020-08-03 DIAGNOSIS — F1010 Alcohol abuse, uncomplicated: Secondary | ICD-10-CM | POA: Diagnosis not present

## 2021-01-13 ENCOUNTER — Observation Stay (HOSPITAL_COMMUNITY)
Admission: EM | Admit: 2021-01-13 | Discharge: 2021-01-18 | Disposition: A | Discharge status: Home Health | Payer: Medicare Other | Attending: Internal Medicine | Admitting: Internal Medicine

## 2021-01-13 ENCOUNTER — Emergency Department (HOSPITAL_COMMUNITY): Payer: Medicare Other

## 2021-01-13 ENCOUNTER — Other Ambulatory Visit: Payer: Self-pay

## 2021-01-13 DIAGNOSIS — Z1624 Resistance to multiple antibiotics: Secondary | ICD-10-CM | POA: Diagnosis present

## 2021-01-13 DIAGNOSIS — R6510 Systemic inflammatory response syndrome (SIRS) of non-infectious origin without acute organ dysfunction: Secondary | ICD-10-CM | POA: Diagnosis present

## 2021-01-13 DIAGNOSIS — L03115 Cellulitis of right lower limb: Secondary | ICD-10-CM | POA: Diagnosis not present

## 2021-01-13 DIAGNOSIS — Z20822 Contact with and (suspected) exposure to covid-19: Secondary | ICD-10-CM | POA: Diagnosis not present

## 2021-01-13 DIAGNOSIS — R0902 Hypoxemia: Secondary | ICD-10-CM | POA: Diagnosis not present

## 2021-01-13 DIAGNOSIS — R6520 Severe sepsis without septic shock: Secondary | ICD-10-CM | POA: Diagnosis present

## 2021-01-13 DIAGNOSIS — Z87891 Personal history of nicotine dependence: Secondary | ICD-10-CM | POA: Insufficient documentation

## 2021-01-13 DIAGNOSIS — R4182 Altered mental status, unspecified: Secondary | ICD-10-CM | POA: Diagnosis not present

## 2021-01-13 DIAGNOSIS — J9811 Atelectasis: Secondary | ICD-10-CM | POA: Diagnosis not present

## 2021-01-13 DIAGNOSIS — Z96653 Presence of artificial knee joint, bilateral: Secondary | ICD-10-CM | POA: Diagnosis present

## 2021-01-13 DIAGNOSIS — E669 Obesity, unspecified: Secondary | ICD-10-CM | POA: Diagnosis present

## 2021-01-13 DIAGNOSIS — R000 Tachycardia, unspecified: Secondary | ICD-10-CM | POA: Diagnosis not present

## 2021-01-13 DIAGNOSIS — A419 Sepsis, unspecified organism: Secondary | ICD-10-CM | POA: Insufficient documentation

## 2021-01-13 DIAGNOSIS — R600 Localized edema: Secondary | ICD-10-CM | POA: Diagnosis not present

## 2021-01-13 DIAGNOSIS — I10 Essential (primary) hypertension: Secondary | ICD-10-CM | POA: Insufficient documentation

## 2021-01-13 DIAGNOSIS — Z886 Allergy status to analgesic agent status: Secondary | ICD-10-CM

## 2021-01-13 DIAGNOSIS — Z79890 Hormone replacement therapy (postmenopausal): Secondary | ICD-10-CM

## 2021-01-13 DIAGNOSIS — Z85828 Personal history of other malignant neoplasm of skin: Secondary | ICD-10-CM

## 2021-01-13 DIAGNOSIS — F319 Bipolar disorder, unspecified: Secondary | ICD-10-CM | POA: Diagnosis present

## 2021-01-13 DIAGNOSIS — Z888 Allergy status to other drugs, medicaments and biological substances status: Secondary | ICD-10-CM

## 2021-01-13 DIAGNOSIS — D649 Anemia, unspecified: Secondary | ICD-10-CM | POA: Diagnosis present

## 2021-01-13 DIAGNOSIS — L03116 Cellulitis of left lower limb: Secondary | ICD-10-CM | POA: Diagnosis not present

## 2021-01-13 DIAGNOSIS — F32A Depression, unspecified: Secondary | ICD-10-CM | POA: Diagnosis present

## 2021-01-13 DIAGNOSIS — K219 Gastro-esophageal reflux disease without esophagitis: Secondary | ICD-10-CM | POA: Diagnosis present

## 2021-01-13 DIAGNOSIS — Z79899 Other long term (current) drug therapy: Secondary | ICD-10-CM | POA: Diagnosis not present

## 2021-01-13 DIAGNOSIS — A4159 Other Gram-negative sepsis: Secondary | ICD-10-CM | POA: Diagnosis not present

## 2021-01-13 DIAGNOSIS — Z882 Allergy status to sulfonamides status: Secondary | ICD-10-CM

## 2021-01-13 DIAGNOSIS — Z7901 Long term (current) use of anticoagulants: Secondary | ICD-10-CM | POA: Diagnosis not present

## 2021-01-13 DIAGNOSIS — N390 Urinary tract infection, site not specified: Secondary | ICD-10-CM | POA: Diagnosis not present

## 2021-01-13 DIAGNOSIS — Z87440 Personal history of urinary (tract) infections: Secondary | ICD-10-CM

## 2021-01-13 DIAGNOSIS — I890 Lymphedema, not elsewhere classified: Secondary | ICD-10-CM | POA: Diagnosis present

## 2021-01-13 DIAGNOSIS — Z86711 Personal history of pulmonary embolism: Secondary | ICD-10-CM

## 2021-01-13 DIAGNOSIS — R1084 Generalized abdominal pain: Secondary | ICD-10-CM | POA: Diagnosis not present

## 2021-01-13 DIAGNOSIS — G9341 Metabolic encephalopathy: Secondary | ICD-10-CM

## 2021-01-13 DIAGNOSIS — Z86718 Personal history of other venous thrombosis and embolism: Secondary | ICD-10-CM

## 2021-01-13 DIAGNOSIS — Z981 Arthrodesis status: Secondary | ICD-10-CM

## 2021-01-13 DIAGNOSIS — B961 Klebsiella pneumoniae [K. pneumoniae] as the cause of diseases classified elsewhere: Secondary | ICD-10-CM | POA: Diagnosis present

## 2021-01-13 DIAGNOSIS — Z6833 Body mass index (BMI) 33.0-33.9, adult: Secondary | ICD-10-CM

## 2021-01-13 LAB — CBC WITH DIFFERENTIAL/PLATELET
Abs Immature Granulocytes: 0.02 10*3/uL (ref 0.00–0.07)
Basophils Absolute: 0.1 10*3/uL (ref 0.0–0.1)
Basophils Relative: 1 %
Eosinophils Absolute: 0.1 10*3/uL (ref 0.0–0.5)
Eosinophils Relative: 2 %
HCT: 38.9 % (ref 36.0–46.0)
Hemoglobin: 12.5 g/dL (ref 12.0–15.0)
Immature Granulocytes: 0 %
Lymphocytes Relative: 34 %
Lymphs Abs: 2.4 10*3/uL (ref 0.7–4.0)
MCH: 24.9 pg — ABNORMAL LOW (ref 26.0–34.0)
MCHC: 32.1 g/dL (ref 30.0–36.0)
MCV: 77.3 fL — ABNORMAL LOW (ref 80.0–100.0)
Monocytes Absolute: 0.7 10*3/uL (ref 0.1–1.0)
Monocytes Relative: 10 %
Neutro Abs: 3.7 10*3/uL (ref 1.7–7.7)
Neutrophils Relative %: 53 %
Platelets: 185 10*3/uL (ref 150–400)
RBC: 5.03 MIL/uL (ref 3.87–5.11)
RDW: 14.5 % (ref 11.5–15.5)
WBC: 7 10*3/uL (ref 4.0–10.5)
nRBC: 0 % (ref 0.0–0.2)

## 2021-01-13 LAB — COMPREHENSIVE METABOLIC PANEL
ALT: 15 U/L (ref 0–44)
AST: 28 U/L (ref 15–41)
Albumin: 3.2 g/dL — ABNORMAL LOW (ref 3.5–5.0)
Alkaline Phosphatase: 72 U/L (ref 38–126)
Anion gap: 9 (ref 5–15)
BUN: 5 mg/dL — ABNORMAL LOW (ref 8–23)
CO2: 28 mmol/L (ref 22–32)
Calcium: 8.8 mg/dL — ABNORMAL LOW (ref 8.9–10.3)
Chloride: 99 mmol/L (ref 98–111)
Creatinine, Ser: 0.84 mg/dL (ref 0.44–1.00)
GFR, Estimated: >60 mL/min (ref >60)
Glucose, Bld: 119 mg/dL — ABNORMAL HIGH (ref 70–99)
Potassium: 3.8 mmol/L (ref 3.5–5.1)
Sodium: 136 mmol/L (ref 135–145)
Total Bilirubin: 1 mg/dL (ref 0.3–1.2)
Total Protein: 6.6 g/dL (ref 6.5–8.1)

## 2021-01-13 LAB — URINALYSIS, ROUTINE W REFLEX MICROSCOPIC
Bilirubin Urine: NEGATIVE
Glucose, UA: NEGATIVE mg/dL
Ketones, ur: NEGATIVE mg/dL
Leukocytes,Ua: NEGATIVE
Nitrite: POSITIVE — AB
Protein, ur: NEGATIVE mg/dL
Specific Gravity, Urine: 1.008 (ref 1.005–1.030)
pH: 5 (ref 5.0–8.0)

## 2021-01-13 LAB — RESP PANEL BY RT-PCR (FLU A&B, COVID) ARPGX2
Influenza A by PCR: NEGATIVE
Influenza B by PCR: NEGATIVE
SARS Coronavirus 2 by RT PCR: NEGATIVE

## 2021-01-13 LAB — PROTIME-INR
INR: 1.4 — ABNORMAL HIGH (ref 0.8–1.2)
Prothrombin Time: 16.7 seconds — ABNORMAL HIGH (ref 11.4–15.2)

## 2021-01-13 LAB — RAPID URINE DRUG SCREEN, HOSP PERFORMED
Amphetamines: NOT DETECTED
Barbiturates: NOT DETECTED
Benzodiazepines: NOT DETECTED
Cocaine: NOT DETECTED
Opiates: NOT DETECTED
Tetrahydrocannabinol: NOT DETECTED

## 2021-01-13 LAB — LACTIC ACID, PLASMA: Lactic Acid, Venous: 1.2 mmol/L (ref 0.5–1.9)

## 2021-01-13 LAB — APTT: aPTT: 34 seconds (ref 24–36)

## 2021-01-13 LAB — ETHANOL: Alcohol, Ethyl (B): <10 mg/dL (ref <10)

## 2021-01-13 MED ORDER — LACTATED RINGERS IV SOLN: INTRAVENOUS | Status: AC

## 2021-01-13 MED ORDER — ACETAMINOPHEN 325 MG PO TABS
650.0000 mg | ORAL_TABLET | Freq: Four times a day (QID) | ORAL | Status: DC | PRN
  Administered 2021-01-13 – 2021-01-18 (×7): 650 mg via ORAL
  Filled 2021-01-13 (×7): qty 2

## 2021-01-13 MED ORDER — ACETAMINOPHEN 650 MG RE SUPP: 650.0000 mg | Freq: Four times a day (QID) | RECTAL | Status: DC | PRN

## 2021-01-13 MED ORDER — LACTATED RINGERS IV BOLUS (SEPSIS): 1000.0000 mL | Freq: Once | INTRAVENOUS | Status: DC

## 2021-01-13 MED ORDER — SODIUM CHLORIDE 0.9 % IV SOLN
1.0000 g | INTRAVENOUS | Status: DC
  Administered 2021-01-14: 1 g via INTRAVENOUS
  Filled 2021-01-13: qty 10

## 2021-01-13 MED ORDER — SODIUM CHLORIDE 0.9 % IV SOLN
1.0000 g | Freq: Once | INTRAVENOUS | Status: AC
  Administered 2021-01-13: 1 g via INTRAVENOUS
  Filled 2021-01-13: qty 10

## 2021-01-13 MED ORDER — LACTATED RINGERS IV BOLUS (SEPSIS): 500.0000 mL | Freq: Once | INTRAVENOUS | Status: DC

## 2021-01-13 MED ORDER — LORAZEPAM 1 MG PO TABS
1.0000 mg | ORAL_TABLET | ORAL | Status: AC | PRN
  Administered 2021-01-14 – 2021-01-16 (×4): 1 mg via ORAL
  Administered 2021-01-16: 2 mg via ORAL
  Filled 2021-01-13 (×2): qty 1
  Filled 2021-01-13: qty 2
  Filled 2021-01-13 (×2): qty 1

## 2021-01-13 MED ORDER — LACTATED RINGERS IV BOLUS
1000.0000 mL | Freq: Once | INTRAVENOUS | Status: AC
  Administered 2021-01-13: 1000 mL via INTRAVENOUS

## 2021-01-13 MED ORDER — THIAMINE HCL 100 MG/ML IJ SOLN
100.0000 mg | Freq: Every day | INTRAMUSCULAR | Status: DC
  Administered 2021-01-16: 100 mg via INTRAVENOUS
  Filled 2021-01-13 (×2): qty 2

## 2021-01-13 MED ORDER — ADULT MULTIVITAMIN W/MINERALS CH
1.0000 | ORAL_TABLET | Freq: Every day | ORAL | Status: DC
  Administered 2021-01-13 – 2021-01-18 (×6): 1 via ORAL
  Filled 2021-01-13 (×6): qty 1

## 2021-01-13 MED ORDER — THIAMINE HCL 100 MG PO TABS
100.0000 mg | ORAL_TABLET | Freq: Every day | ORAL | Status: DC
  Administered 2021-01-13 – 2021-01-18 (×5): 100 mg via ORAL
  Filled 2021-01-13 (×5): qty 1

## 2021-01-13 MED ORDER — LORAZEPAM 2 MG/ML IJ SOLN: 1.0000 mg | INTRAMUSCULAR | Status: AC | PRN

## 2021-01-13 MED ORDER — FOLIC ACID 1 MG PO TABS
1.0000 mg | ORAL_TABLET | Freq: Every day | ORAL | Status: DC
  Administered 2021-01-13 – 2021-01-18 (×6): 1 mg via ORAL
  Filled 2021-01-13 (×6): qty 1

## 2021-01-13 NOTE — ED Triage Notes (Signed)
Pt from home for eval of altered mental status, urinary frequency, and fever for one week. For three days pt having increased edema in BLE and AMS. C/o bladder pain. Hx frequent UTI's.

## 2021-01-13 NOTE — H&P (Signed)
History and Physical    Denise Kelly GEX:[id number] DOB: Mar 06, 1941 DOA: 01/13/2021  PCP: [name], MD (Inactive) Patient coming from: Home  Chief Complaint: Dysuria, altered mental status  HPI: Denise Kelly is a 80 y.o. 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  In the ED, afebrile but tachycardic. Labs showing WBC 7.0, hemoglobin 12.5, platelet count 185k.  Sodium 136, potassium 3.8, chloride 99, bicarb 28, BUN 5, creatinine 0.8, glucose 119.  LFTs normal.  UA with positive nitrite and many bacteria.  Urine culture pending.  COVID and influenza PCR negative.  UDS negative.  Blood ethanol level undetectable.  Chest x-ray not suggestive of pneumonia.  CT head negative for acute intracranial abnormality. Patient was given ceftriaxone.  Patient states she has a UTI.  Reports dysuria and urinary frequency for several days.  Denies fevers at home.  Reports history of prior UTIs.  Denies nausea, vomiting, abdominal pain, or diarrhea.  Denies cough, shortness of breath, or chest pain.  Reports chronic bilateral lower extremity edema due to "lymphedema."  Does report prior history of blood clots for which she is on Eliquis but ran out of the medication 2 weeks ago and could not afford to get a refill because her insurance was not paying for it.  She was finally able to get a sample of Eliquis 2 days ago.  Patient states she lives alone with 2 cats.  States "my daughter killed herself."  Review of Systems:  All systems reviewed and apart from history of presenting illness, are negative.  Past Medical History:  Diagnosis Date   Alcohol abuse    ETOH and xanax   Anxiety    Panic attacks    Arthritis    Cancer ([place])    skin-  basal , squamous , melonoma- right hand   Cellulitis    right leg   Complication of anesthesia 2009   "felt drunk for a week after"- AVM- both times- felt drunk   Depression    Encephalopathy    GERD (gastroesophageal reflux disease)    HOH (hard of hearing)    HOH (hard of hearing)    Hypertension    not on mediacations   Palpitations    PONV (postoperative nausea and vomiting)    Spondylolisthesis of lumbosacral region    UTI (urinary tract infection)    frequently    Past Surgical History:  Procedure Laterality Date   BIOPSY  01/24/2018   Procedure: BIOPSY;  Surgeon: Ronnette Juniper, MD;  Location: [facility] ENDOSCOPY;  Service: Gastroenterology;;   BREAST SURGERY Right    2 breast biopsies   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   COLONOSCOPY  2011   polyps   ESOPHAGOGASTRODUODENOSCOPY (EGD) WITH PROPOFOL N/A 01/24/2018   Procedure: ESOPHAGOGASTRODUODENOSCOPY (EGD) WITH PROPOFOL;  Surgeon: Ronnette Juniper, MD;  Location: [place];  Service: Gastroenterology;  Laterality: N/A;   EYE SURGERY Bilateral    cataracts   INTRAMEDULLARY (IM) NAIL INTERTROCHANTERIC Left 11/29/2015   Procedure: LEFT HIP   NAIL;  Surgeon: Renette Butters, MD;  Location: [place];  Service: Orthopedics;  Laterality: Left;   JOINT REPLACEMENT Left 09/2009   knee   LUMBAR FUSION  07/17/2016   Lumbar four-five Posterior lumbar interbody fusion (N/A)  TONSILLECTOMY     TOTAL KNEE ARTHROPLASTY Right 01/02/2014   Procedure: TOTAL KNEE ARTHROPLASTY;  Surgeon: [name], MD;  Location: [place];  Service: Orthopedics;  Laterality: Right;   TUBAL LIGATION       reports that she quit smoking about 28 years ago. She has a 20.00 pack-year smoking history. She has never used smokeless tobacco. She reports that she does not drink alcohol and does not use drugs.  Allergies  Allergen Reactions   Sulfa Antibiotics Hives, Swelling and Other (See Comments)    Reaction:  Facial/eye swelling    Coreg [Carvedilol]  Other (See Comments)    Memory loss   Motrin [Ibuprofen] Palpitations   Toprol Xl [Metoprolol Tartrate] Cough   Doxycycline Hyclate Other (See Comments)    HEARTBURN   Flovent Hfa [Fluticasone] Itching   Statins Other (See Comments)    MENTAL STATUS CHANGE    Family History  Problem Relation Age of Onset   Hypertension Other    Cancer Mother    Cancer Father     Prior to Admission medications   Medication Sig Start Date End Date Taking? Authorizing Provider  acetaminophen (TYLENOL) 325 MG tablet Take 2 tablets (650 mg total) by mouth every 6 (six) hours as needed for mild pain. 01/25/18   Elgergawy, Silver Huguenin, MD  acidophilus (RISAQUAD) CAPS capsule Take 2 capsules by mouth daily. 05/29/16   Elgergawy, Silver Huguenin, MD  apixaban (ELIQUIS) 5 MG TABS tablet Take 1 tablet (5 mg total) by mouth 2 (two) times daily. 01/13/18 02/12/18  Elodia Florence., MD  estradiol (ESTRACE) 0.1 MG/GM vaginal cream Place 1 Applicatorful vaginally 2 (two) times a week.     [provider]  ferrous sulfate 325 (65 FE) MG EC tablet Take 1 tablet (325 mg total) by mouth 2 (two) times daily. 01/25/18 01/25/19  Elgergawy, Silver Huguenin, MD  gabapentin (NEURONTIN) 100 MG capsule Take 2 capsules (200 mg total) by mouth 2 (two) times daily. 10/03/17   Nita Sells, MD  hydrOXYzine (ATARAX/VISTARIL) 25 MG tablet Take 25 mg by mouth at bedtime.     [provider]  loperamide (IMODIUM) 2 MG capsule Take 1 capsule (2 mg total) by mouth 2 (two) times daily as needed for diarrhea or loose stools. 10/03/17   Nita Sells, MD  loratadine (CLARITIN) 10 MG tablet Take 1 tablet (10 mg total) by mouth daily. 01/08/18 02/07/18  Elodia Florence., MD  ondansetron (ZOFRAN-ODT) 4 MG disintegrating tablet Take 4 mg by mouth every 8 (eight) hours as needed for nausea.  12/21/17   [provider]  pantoprazole (PROTONIX) 40 MG tablet Take 1 tablet (40 mg total) by mouth 2 (two) times daily  before a meal. 01/25/18   Elgergawy, Silver Huguenin, MD  polyethylene glycol (MIRALAX / GLYCOLAX) packet Take 17 g by mouth daily.    [provider]  senna-docusate (SENOKOT-S) 8.6-50 MG tablet Take 1 tablet by mouth daily as needed for mild constipation.    [provider]  spironolactone (ALDACTONE) 50 MG tablet Take 50 mg by mouth 2 (two) times daily.  09/16/17   [provider]  traZODone (DESYREL) 50 MG tablet Take 1 tablet (50 mg total) by mouth daily as needed for sleep. Patient taking differently: Take 50 mg by mouth at bedtime.  10/03/17   Nita Sells, MD  Zinc Oxide (TRIPLE PASTE) 12.8 % ointment Apply topically as needed for irritation. Patient taking differently: Apply 1 application topically daily as  needed for irritation.  10/03/17   Nita Sells, MD    Physical Exam: Vitals:   01/13/21 1815 01/13/21 1830 01/13/21 1900 01/13/21 2000  BP:  (!) 129/48 100/74 (!) 110/92  Pulse:  (!) 107 (!) 108 (!) 106  Resp:  19 16 18   Temp:      TempSrc:      SpO2: 97% 91% 93% 91%    Physical Exam Constitutional:      General: She is not in acute distress. HENT:     Head: Normocephalic and atraumatic.     Mouth/Throat:     Mouth: Mucous membranes are dry.  Eyes:     Extraocular Movements: Extraocular movements intact.     Conjunctiva/sclera: Conjunctivae normal.  Cardiovascular:     Rate and Rhythm: Normal rate and regular rhythm.     Pulses: Normal pulses.  Pulmonary:     Effort: Pulmonary effort is normal. No respiratory distress.     Breath sounds: Normal breath sounds. No wheezing or rales.  Abdominal:     General: Bowel sounds are normal. There is no distension.     Palpations: Abdomen is soft.     Tenderness: There is no abdominal tenderness.  Musculoskeletal:     Cervical back: Normal range of motion and neck supple.     Right lower leg: Edema present.     Left lower leg: Edema present.     Comments: Bilateral lower extremity swollen,  erythematous, and warm to touch.  Skin:    General: Skin is warm and dry.  Neurological:     General: No focal deficit present.     Mental Status: She is alert and oriented to person, place, and time.       Labs on Admission: I have personally reviewed following labs and imaging studies  CBC: Recent Labs  Lab 01/13/21 1507  WBC 7.0  NEUTROABS 3.7  HGB 12.5  HCT 38.9  MCV 77.3*  PLT 998   Basic Metabolic Panel: Recent Labs  Lab 01/13/21 1507  NA 136  K 3.8  CL 99  CO2 28  GLUCOSE 119*  BUN 5*  CREATININE 0.84  CALCIUM 8.8*   GFR: CrCl cannot be calculated (Unknown ideal weight.). Liver Function Tests: Recent Labs  Lab 01/13/21 1507  AST 28  ALT 15  ALKPHOS 72  BILITOT 1.0  PROT 6.6  ALBUMIN 3.2*   No results for input(s): LIPASE, AMYLASE in the last 168 hours. No results for input(s): AMMONIA in the last 168 hours. Coagulation Profile: No results for input(s): INR, PROTIME in the last 168 hours. Cardiac Enzymes: No results for input(s): CKTOTAL, CKMB, CKMBINDEX, TROPONINI in the last 168 hours. BNP (last 3 results) No results for input(s): PROBNP in the last 8760 hours. HbA1C: No results for input(s): HGBA1C in the last 72 hours. CBG: No results for input(s): GLUCAP in the last 168 hours. Lipid Profile: No results for input(s): CHOL, HDL, LDLCALC, TRIG, CHOLHDL, LDLDIRECT in the last 72 hours. Thyroid Function Tests: No results for input(s): TSH, T4TOTAL, FREET4, T3FREE, THYROIDAB in the last 72 hours. Anemia Panel: No results for input(s): VITAMINB12, FOLATE, FERRITIN, TIBC, IRON, RETICCTPCT in the last 72 hours. Urine analysis:    Component Value Date/Time   COLORURINE AMBER (A) 01/13/2021 1504   APPEARANCEUR CLEAR 01/13/2021 1504   APPEARANCEUR Clear 09/19/2016 1633   LABSPEC 1.008 01/13/2021 1504   PHURINE 5.0 01/13/2021 1504   GLUCOSEU NEGATIVE 01/13/2021 1504   HGBUR SMALL (A) 01/13/2021 1504  BILIRUBINUR NEGATIVE 01/13/2021 1504    BILIRUBINUR Negative 09/19/2016 [place] 01/13/2021 [place] 01/13/2021 1504   UROBILINOGEN 1.0 11/30/2014 2105   NITRITE POSITIVE (A) 01/13/2021 1504   LEUKOCYTESUR NEGATIVE 01/13/2021 1504    Radiological Exams on Admission: CT HEAD WO CONTRAST (5MM)  Result Date: 01/13/2021 CLINICAL DATA:  80 year old female with altered mental status. EXAM: CT HEAD WITHOUT CONTRAST TECHNIQUE: Contiguous axial images were obtained from the base of the skull through the vertex without intravenous contrast. COMPARISON:  09/28/2017 MR/CT and prior studies FINDINGS: Brain: No evidence of acute infarction, hemorrhage, hydrocephalus, extra-axial collection or mass lesion/mass effect. Embolization coils at the LEFT skull base are again noted, creating artifact. Atrophy and chronic small-vessel white matter ischemic changes are again noted. Vascular: Carotid atherosclerotic calcifications are noted. Skull: Normal. Negative for fracture or focal lesion. Sinuses/Orbits: No acute abnormality Other: None IMPRESSION: 1. No evidence of acute intracranial abnormality. 2. Atrophy and chronic small-vessel white matter ischemic changes. Electronically Signed   By: Margarette Canada M.D.   On: 01/13/2021 17:29   DG Chest Portable 1 View  Result Date: 01/13/2021 CLINICAL DATA:  Altered mental status. EXAM: PORTABLE CHEST 1 VIEW COMPARISON:  Chest x-ray 01/04/2018, CT chest 01/23/2018. FINDINGS: The heart and mediastinal contours are within unchanged. Bibasilar atelectasis. no focal consolidation. No pulmonary edema. No pleural effusion. No pneumothorax. No acute osseous abnormality. IMPRESSION: No active disease. Electronically Signed   By: Iven Finn M.D.   On: 01/13/2021 15:35    EKG: Pending.  Assessment/Plan Principal Problem:   UTI (urinary tract infection) Active Problems:   SIRS (systemic inflammatory response syndrome) (HCC)   Depression   Bilateral lower extremity edema   Acute  metabolic encephalopathy   UTI SIRS versus possible sepsis Patient is endorsing dysuria and urinary frequency.  UA with positive nitrite and many bacteria. ?Fevers at home, patient told triage that she was having fevers but denies when I spoke to her.  She is tachycardic.  No leukocytosis on labs.  Prior urine culture in the chart from 3 years ago growing multidrug-resistant Serratia marcescens. Discussed with pharmacy, plan is to continue ceftriaxone for now but if no improvement, then escalate antibiotics. -Continue ceftriaxone.  Give 1 L fluid bolus to see if tachycardia improves, may need additional fluid.  Not hypotensive.  Check lactic acid x2.  Urine culture pending.  Order blood cultures.  Monitor WBC count.  Bilateral lower extremity edema and erythema Bilateral lower extremities swollen, erythematous, and warm to touch.  Findings concerning for cellulitis versus possible DVT.  She is chronically anticoagulated with Eliquis due to history of prior PE/DVT in 2019 but ran out of Eliquis for about 2 weeks.  PE felt to be less likely given no hypoxia or respiratory symptoms. -Continue ceftriaxone.  Bilateral lower extremity Dopplers ordered.  Acute metabolic encephalopathy Likely secondary to underlying infection/UTI.  Currently AAO x3 and answering questions appropriately.  UDS negative and blood ethanol level undetectable.  Head CT negative for acute intracranial abnormality.  No focal neurodeficit. -Continue to monitor  History of DVT/PE on chronic anticoagulation -Resume Eliquis after pharmacy med rec is done.  History of alcohol dependence -CIWA protocol; Ativan as needed.  Thiamine, folate, and multivitamin.  Hypertension -Hold antihypertensives at this time given concern for possible sepsis  Bipolar disorder, anxiety, depression GERD -Pharmacy med rec pending.  DVT prophylaxis: Resume Eliquis after pharmacy med rec is done. Code Status: Full code-discussed with  patient. Family Communication: No  family available at this time. Disposition Plan: Status is: Observation  The patient remains OBS appropriate and will d/c before 2 midnights.  Level of care: Level of care: Telemetry Medical  The medical decision making on this patient was of high complexity and the patient is at high risk for clinical deterioration, therefore this is a level 3 visit.  Shela Leff MD [facility]  If 7PM-7AM, please contact night-coverage [email or web address]  01/13/2021, 8:54 PM

## 2021-01-13 NOTE — Sepsis Progress Note (Addendum)
Elink following for Sepsis Protocol which started at 20:42. Patient had 2 MAPS < 65 between 4pm and 6:30pm, Abx's already had been given at 6:33pm.  Awaiting for Lactic and Blood Cultures to be drawn as ordered, continue to monitor with ED RN aware of needed labs

## 2021-01-13 NOTE — ED Notes (Signed)
Patient transported to CT 

## 2021-01-13 NOTE — ED Provider Notes (Signed)
[facility] EMERGENCY DEPARTMENT Provider Note   CSN: [id number] Arrival date & time: 01/13/21  1421     History Chief Complaint  Patient presents with   Altered Mental Status   Fever    Denise Kelly is a 80 y.o. female.   Altered Mental Status Presenting symptoms: confusion   Associated symptoms: fever   Associated symptoms: no abdominal pain, no rash and no weakness   Fever Associated symptoms: confusion and dysuria   Associated symptoms: no chest pain, no congestion and no rash   Patient reportedly brought in for mental status change.  Reportedly has been confused having fever for a week.  Patient states she is worried she has a UTI.  Reportedly has history of same.  Does have some edema on legs.  No nausea or vomiting.  Patient is alert and oriented x3 but also yelling out at times.    Past Medical History:  Diagnosis Date   Alcohol abuse    ETOH and xanax   Anxiety    Panic attacks    Arthritis    Cancer ([place])    skin- basal , squamous , melonoma- right hand   Cellulitis    right leg   Complication of anesthesia 2009   "felt drunk for a week after"- AVM- both times- felt drunk   Depression    Encephalopathy    GERD (gastroesophageal reflux disease)    HOH (hard of hearing)    HOH (hard of hearing)    Hypertension    not on mediacations   Palpitations    PONV (postoperative nausea and vomiting)    Spondylolisthesis of lumbosacral region    UTI (urinary tract infection)    frequently    Patient Active Problem List   Diagnosis Date Noted   Upper GI bleed 01/23/2018   Leukocytosis 01/23/2018   Physical deconditioning 01/23/2018   Acute pulmonary embolus ([place]) 01/04/2018   Acute encephalopathy 09/28/2017   Constipation, slow transit 09/28/2017   Unsteady gait 04/28/2017   Spondylolisthesis 07/17/2016   Bilateral lower extremity edema 07/17/2016   Chronic pain syndrome    Depression 05/23/2016   Chronic back pain 02/25/2016    Lesion of liver 02/25/2016   History of fracture of left hip 11/28/2015   Fall 11/28/2015   RLS (restless legs syndrome) 11/28/2015   Confusion 03/14/2014   Recurrent UTI 03/14/2014   Effusion of right knee 02/22/2014   Alcohol dependence ([place]) 02/22/2014   Substance induced mood disorder ([place]) 02/22/2014   Hepatic encephalopathy ([place])    Altered mental status 02/21/2014   SIRS (systemic inflammatory response syndrome) ([place]) 02/21/2014   S/P total knee arthroplasty 01/02/2014   Essential hypertension, benign 11/10/2013   LAFB (left anterior fascicular block) 11/10/2013   Obesity, unspecified 11/10/2013    Past Surgical History:  Procedure Laterality Date   BIOPSY  01/24/2018   Procedure: BIOPSY;  Surgeon: Ronnette Juniper, MD;  Location: [facility] ENDOSCOPY;  Service: Gastroenterology;;   BREAST SURGERY Right    2 breast biopsies   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   COLONOSCOPY  2011   polyps   ESOPHAGOGASTRODUODENOSCOPY (EGD) WITH PROPOFOL N/A 01/24/2018   Procedure: ESOPHAGOGASTRODUODENOSCOPY (EGD) WITH PROPOFOL;  Surgeon: Ronnette Juniper, MD;  Location: [place];  Service: Gastroenterology;  Laterality: N/A;   EYE SURGERY Bilateral    cataracts   INTRAMEDULLARY (IM) NAIL INTERTROCHANTERIC Left 11/29/2015   Procedure: LEFT HIP   NAIL;  Surgeon: Renette Butters, MD;  Location: [place];  Service: Orthopedics;  Laterality: Left;   JOINT REPLACEMENT Left 09/2009   knee   LUMBAR FUSION  07/17/2016   Lumbar four-five Posterior lumbar interbody fusion (N/A)   TONSILLECTOMY     TOTAL KNEE ARTHROPLASTY Right 01/02/2014   Procedure: TOTAL KNEE ARTHROPLASTY;  Surgeon: [name], MD;  Location: [place];  Service: Orthopedics;  Laterality: Right;   TUBAL LIGATION       OB History   No obstetric history on file.     Family History  Problem Relation Age of Onset   Hypertension Other    Cancer Mother    Cancer Father     Social History   Tobacco Use   Smoking status:  Former    Packs/day: 1.00    Years: 20.00    Pack years: 20.00    Types: Cigarettes    Quit date: 03/17/1992    Years since quitting: 28.8   Smokeless tobacco: Never   Tobacco comments:    quit smoking many years ago .  quit 1994  Vaping Use   Vaping Use: Never used  Substance Use Topics   Alcohol use: No    Comment: unable to get alcohol per family not since September   Drug use: No    Home Medications Prior to Admission medications   Medication Sig Start Date End Date Taking? Authorizing Provider  acetaminophen (TYLENOL) 325 MG tablet Take 2 tablets (650 mg total) by mouth every 6 (six) hours as needed for mild pain. 01/25/18   Elgergawy, Silver Huguenin, MD  acidophilus (RISAQUAD) CAPS capsule Take 2 capsules by mouth daily. 05/29/16   Elgergawy, Silver Huguenin, MD  apixaban (ELIQUIS) 5 MG TABS tablet Take 1 tablet (5 mg total) by mouth 2 (two) times daily. 01/13/18 02/12/18  Elodia Florence., MD  estradiol (ESTRACE) 0.1 MG/GM vaginal cream Place 1 Applicatorful vaginally 2 (two) times a week.     [provider]  ferrous sulfate 325 (65 FE) MG EC tablet Take 1 tablet (325 mg total) by mouth 2 (two) times daily. 01/25/18 01/25/19  Elgergawy, Silver Huguenin, MD  gabapentin (NEURONTIN) 100 MG capsule Take 2 capsules (200 mg total) by mouth 2 (two) times daily. 10/03/17   Nita Sells, MD  hydrOXYzine (ATARAX/VISTARIL) 25 MG tablet Take 25 mg by mouth at bedtime.     [provider]  loperamide (IMODIUM) 2 MG capsule Take 1 capsule (2 mg total) by mouth 2 (two) times daily as needed for diarrhea or loose stools. 10/03/17   Nita Sells, MD  loratadine (CLARITIN) 10 MG tablet Take 1 tablet (10 mg total) by mouth daily. 01/08/18 02/07/18  Elodia Florence., MD  ondansetron (ZOFRAN-ODT) 4 MG disintegrating tablet Take 4 mg by mouth every 8 (eight) hours as needed for nausea.  12/21/17   [provider]  pantoprazole (PROTONIX) 40 MG tablet Take 1 tablet (40 mg  total) by mouth 2 (two) times daily before a meal. 01/25/18   Elgergawy, Silver Huguenin, MD  polyethylene glycol (MIRALAX / GLYCOLAX) packet Take 17 g by mouth daily.    [provider]  senna-docusate (SENOKOT-S) 8.6-50 MG tablet Take 1 tablet by mouth daily as needed for mild constipation.    [provider]  spironolactone (ALDACTONE) 50 MG tablet Take 50 mg by mouth 2 (two) times daily.  09/16/17   [provider]  traZODone (DESYREL) 50 MG tablet Take 1 tablet (50 mg total) by mouth daily as needed for  sleep. Patient taking differently: Take 50 mg by mouth at bedtime.  10/03/17   Nita Sells, MD  Zinc Oxide (TRIPLE PASTE) 12.8 % ointment Apply topically as needed for irritation. Patient taking differently: Apply 1 application topically daily as needed for irritation.  10/03/17   Nita Sells, MD    Allergies    Sulfa antibiotics, Coreg [carvedilol], Motrin [ibuprofen], Toprol xl [metoprolol tartrate], Doxycycline hyclate, Flovent hfa [fluticasone], and Statins  Review of Systems   Review of Systems  Constitutional:  Positive for fever.  HENT:  Negative for congestion.   Respiratory:  Negative for shortness of breath.   Cardiovascular:  Negative for chest pain.  Gastrointestinal:  Negative for abdominal pain.  Genitourinary:  Positive for dysuria.  Musculoskeletal:  Negative for back pain.  Skin:  Negative for rash.  Neurological:  Negative for weakness.  Psychiatric/Behavioral:  Positive for confusion.    Physical Exam Updated Vital Signs BP 111/63   Pulse (!) 104   Temp (!) 97.5 F (36.4 C) (Oral)   Resp 15   SpO2 96%   Physical Exam Vitals and nursing note reviewed.  HENT:     Head: Normocephalic.  Eyes:     Pupils: Pupils are equal, round, and reactive to light.  Cardiovascular:     Rate and Rhythm: Regular rhythm. Tachycardia present.  Pulmonary:     Effort: Pulmonary effort is normal.  Abdominal:     Tenderness: There is no  abdominal tenderness.  Musculoskeletal:        General: No tenderness.     Cervical back: Neck supple.  Skin:    General: Skin is warm.     Coloration: Skin is not jaundiced.  Neurological:     General: No focal deficit present.     Mental Status: She is alert and oriented to person, place, and time.    ED Results / Procedures / Treatments   Labs (all labs ordered are listed, but only abnormal results are displayed) Labs Reviewed  COMPREHENSIVE METABOLIC PANEL - Abnormal; Notable for the following components:      Result Value   Glucose, Bld 119 (*)    BUN 5 (*)    Calcium 8.8 (*)    Albumin 3.2 (*)    All other components within normal limits  URINALYSIS, ROUTINE W REFLEX MICROSCOPIC - Abnormal; Notable for the following components:   Color, Urine AMBER (*)    Hgb urine dipstick SMALL (*)    Nitrite POSITIVE (*)    Bacteria, UA MANY (*)    All other components within normal limits  CBC WITH DIFFERENTIAL/PLATELET - Abnormal; Notable for the following components:   MCV 77.3 (*)    MCH 24.9 (*)    All other components within normal limits  RESP PANEL BY RT-PCR (FLU A&B, COVID) [id number]  URINE CULTURE  ETHANOL  RAPID URINE DRUG SCREEN, HOSP PERFORMED    EKG None  Radiology CT HEAD WO CONTRAST (5MM)  Result Date: 01/13/2021 CLINICAL DATA:  80 year old female with altered mental status. EXAM: CT HEAD WITHOUT CONTRAST TECHNIQUE: Contiguous axial images were obtained from the base of the skull through the vertex without intravenous contrast. COMPARISON:  09/28/2017 MR/CT and prior studies FINDINGS: Brain: No evidence of acute infarction, hemorrhage, hydrocephalus, extra-axial collection or mass lesion/mass effect. Embolization coils at the LEFT skull base are again noted, creating artifact. Atrophy and chronic small-vessel white matter ischemic changes are again noted. Vascular: Carotid atherosclerotic calcifications are noted. Skull: Normal. Negative for fracture or focal  lesion. Sinuses/Orbits: No acute abnormality Other: None IMPRESSION: 1. No evidence of acute intracranial abnormality. 2. Atrophy and chronic small-vessel white matter ischemic changes. Electronically Signed   By: Margarette Canada M.D.   On: 01/13/2021 17:29   DG Chest Portable 1 View  Result Date: 01/13/2021 CLINICAL DATA:  Altered mental status. EXAM: PORTABLE CHEST 1 VIEW COMPARISON:  Chest x-ray 01/04/2018, CT chest 01/23/2018. FINDINGS: The heart and mediastinal contours are within unchanged. Bibasilar atelectasis. no focal consolidation. No pulmonary edema. No pleural effusion. No pneumothorax. No acute osseous abnormality. IMPRESSION: No active disease. Electronically Signed   By: Iven Finn M.D.   On: 01/13/2021 15:35    Procedures Procedures   Medications Ordered in ED Medications  cefTRIAXone (ROCEPHIN) 1 g in sodium chloride 0.9 % 100 mL IVPB (has no administration in time range)    ED Course  I have reviewed the triage vital signs and the nursing notes.  Pertinent labs & imaging results that were available during my care of the patient were reviewed by me and considered in my medical decision making (see chart for details).    MDM Rules/Calculators/A&P                           Patient brought in for mental status change.  Patient is rather alert and oriented, however reportedly told the nurse that her cat called 911.  Lab work overall reassuring but does have likely urinary tract infection.  He does have many bacteria and nitrites but does not have white cells.  Culture sent.  Head CT done reassuring.  However with the bacteria and nitrites I feel this is likely an infection.  Especially since she has had similar symptoms with UTI in the past.  With mental status changes will admit to hospitalist. Final Clinical Impression(s) / ED Diagnoses Final diagnoses:  Lower urinary tract infectious disease    Rx / DC Orders ED Discharge Orders     None        Davonna Belling, MD 01/13/21 [place]

## 2021-01-14 ENCOUNTER — Observation Stay (HOSPITAL_COMMUNITY): Payer: Medicare Other

## 2021-01-14 DIAGNOSIS — Z86711 Personal history of pulmonary embolism: Secondary | ICD-10-CM | POA: Diagnosis not present

## 2021-01-14 DIAGNOSIS — N390 Urinary tract infection, site not specified: Secondary | ICD-10-CM | POA: Diagnosis not present

## 2021-01-14 DIAGNOSIS — I10 Essential (primary) hypertension: Secondary | ICD-10-CM | POA: Diagnosis present

## 2021-01-14 DIAGNOSIS — A4159 Other Gram-negative sepsis: Secondary | ICD-10-CM | POA: Diagnosis present

## 2021-01-14 DIAGNOSIS — M7989 Other specified soft tissue disorders: Secondary | ICD-10-CM | POA: Diagnosis not present

## 2021-01-14 DIAGNOSIS — L03116 Cellulitis of left lower limb: Secondary | ICD-10-CM

## 2021-01-14 DIAGNOSIS — Z86718 Personal history of other venous thrombosis and embolism: Secondary | ICD-10-CM | POA: Diagnosis not present

## 2021-01-14 DIAGNOSIS — F319 Bipolar disorder, unspecified: Secondary | ICD-10-CM | POA: Diagnosis present

## 2021-01-14 DIAGNOSIS — D649 Anemia, unspecified: Secondary | ICD-10-CM | POA: Diagnosis present

## 2021-01-14 DIAGNOSIS — G9341 Metabolic encephalopathy: Secondary | ICD-10-CM

## 2021-01-14 DIAGNOSIS — E669 Obesity, unspecified: Secondary | ICD-10-CM | POA: Diagnosis present

## 2021-01-14 DIAGNOSIS — Z85828 Personal history of other malignant neoplasm of skin: Secondary | ICD-10-CM | POA: Diagnosis not present

## 2021-01-14 DIAGNOSIS — R609 Edema, unspecified: Secondary | ICD-10-CM

## 2021-01-14 DIAGNOSIS — R600 Localized edema: Secondary | ICD-10-CM | POA: Diagnosis present

## 2021-01-14 DIAGNOSIS — Z981 Arthrodesis status: Secondary | ICD-10-CM | POA: Diagnosis not present

## 2021-01-14 DIAGNOSIS — K219 Gastro-esophageal reflux disease without esophagitis: Secondary | ICD-10-CM | POA: Diagnosis present

## 2021-01-14 DIAGNOSIS — Z7901 Long term (current) use of anticoagulants: Secondary | ICD-10-CM | POA: Diagnosis not present

## 2021-01-14 DIAGNOSIS — Z6833 Body mass index (BMI) 33.0-33.9, adult: Secondary | ICD-10-CM | POA: Diagnosis not present

## 2021-01-14 DIAGNOSIS — I890 Lymphedema, not elsewhere classified: Secondary | ICD-10-CM | POA: Diagnosis not present

## 2021-01-14 DIAGNOSIS — B961 Klebsiella pneumoniae [K. pneumoniae] as the cause of diseases classified elsewhere: Secondary | ICD-10-CM | POA: Diagnosis present

## 2021-01-14 DIAGNOSIS — R6520 Severe sepsis without septic shock: Secondary | ICD-10-CM | POA: Diagnosis present

## 2021-01-14 DIAGNOSIS — Z1624 Resistance to multiple antibiotics: Secondary | ICD-10-CM | POA: Diagnosis present

## 2021-01-14 DIAGNOSIS — L03115 Cellulitis of right lower limb: Secondary | ICD-10-CM

## 2021-01-14 DIAGNOSIS — Z87440 Personal history of urinary (tract) infections: Secondary | ICD-10-CM | POA: Diagnosis not present

## 2021-01-14 DIAGNOSIS — Z20822 Contact with and (suspected) exposure to covid-19: Secondary | ICD-10-CM | POA: Diagnosis present

## 2021-01-14 DIAGNOSIS — Z96653 Presence of artificial knee joint, bilateral: Secondary | ICD-10-CM | POA: Diagnosis present

## 2021-01-14 LAB — LACTIC ACID, PLASMA: Lactic Acid, Venous: 0.8 mmol/L (ref 0.5–1.9)

## 2021-01-14 LAB — CBC
HCT: 34.7 % — ABNORMAL LOW (ref 36.0–46.0)
Hemoglobin: 11.2 g/dL — ABNORMAL LOW (ref 12.0–15.0)
MCH: 25.2 pg — ABNORMAL LOW (ref 26.0–34.0)
MCHC: 32.3 g/dL (ref 30.0–36.0)
MCV: 78.2 fL — ABNORMAL LOW (ref 80.0–100.0)
Platelets: 160 10*3/uL (ref 150–400)
RBC: 4.44 MIL/uL (ref 3.87–5.11)
RDW: 14.6 % (ref 11.5–15.5)
WBC: 5.3 10*3/uL (ref 4.0–10.5)
nRBC: 0 % (ref 0.0–0.2)

## 2021-01-14 MED ORDER — PRAMIPEXOLE DIHYDROCHLORIDE 0.25 MG PO TABS
0.5000 mg | ORAL_TABLET | Freq: Every evening | ORAL | Status: DC | PRN
  Administered 2021-01-17: 0.5 mg via ORAL
  Filled 2021-01-14 (×3): qty 4
  Filled 2021-01-14: qty 2

## 2021-01-14 MED ORDER — APIXABAN 5 MG PO TABS
5.0000 mg | ORAL_TABLET | Freq: Two times a day (BID) | ORAL | Status: DC
  Administered 2021-01-14 – 2021-01-18 (×9): 5 mg via ORAL
  Filled 2021-01-14 (×9): qty 1

## 2021-01-14 MED ORDER — PANTOPRAZOLE SODIUM 40 MG PO TBEC
40.0000 mg | DELAYED_RELEASE_TABLET | Freq: Every day | ORAL | Status: DC
  Administered 2021-01-14 – 2021-01-18 (×5): 40 mg via ORAL
  Filled 2021-01-14 (×5): qty 1

## 2021-01-14 MED ORDER — ONDANSETRON HCL 4 MG/2ML IJ SOLN
4.0000 mg | Freq: Four times a day (QID) | INTRAMUSCULAR | Status: DC | PRN
  Administered 2021-01-16: 4 mg via INTRAVENOUS
  Filled 2021-01-14 (×2): qty 2

## 2021-01-14 MED ORDER — POLYETHYLENE GLYCOL 3350 17 G PO PACK
17.0000 g | PACK | Freq: Every day | ORAL | Status: DC | PRN
  Filled 2021-01-14: qty 1

## 2021-01-14 NOTE — Progress Notes (Signed)
Lower extremity venous has been completed.   Preliminary results in CV Proc.   Denise Kelly 01/14/2021 9:45 AM

## 2021-01-14 NOTE — Progress Notes (Signed)
[facility] PROGRESS NOTE   Denise Kelly MOL:[id number] DOB: 01-08-1941 DOA: 01/13/2021  PCP: [name], MD (Inactive)  Brief History/Interval Summary: 80 y.o. 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  In the ED, afebrile but tachycardic. Labs showing WBC 7.0, hemoglobin 12.5, platelet count 185k.  Sodium 136, potassium 3.8, chloride 99, bicarb 28, BUN 5, creatinine 0.8, glucose 119.  LFTs normal.  UA with positive nitrite and many bacteria.  Patient was hospitalized for management of urinary tract infection and bilateral lower extremity cellulitis.  Reason for Visit: Urinary tract infection and bilateral lower extremity cellulitis  Consultants: None  Procedures: Lower extremity Doppler studies pending    Subjective/Interval History: Patient denies any leg pain.  She does mention that the legs are more erythematous than previously.  Did have bowel dysuria yesterday but appears to be better.  Denies any nausea or vomiting.     Assessment/Plan:  Urinary tract infection/sepsis present on admission Patient noted to be tachycardic and tachypneic at the time of admission.  She was however noted to be afebrile.  WBC was noted to be normal.  But patient was still complaining of dysuria and urinary frequency.  UA was noted to be abnormal.  Patient placed on ceftriaxone.  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  Completed on urine cultures to result from this admission. Sepsis physiology has improved.  Lactic acid level is normal.  Cut back on IV fluids.  Bilateral lower extremity cellulitis in the setting of chronic lymphedema Patient has chronic lymphedema with erythema-at baseline.  However she has  definitely more redness in both her legs.  Denies any pain per se.  Continue with the antibiotics as discussed above.  Lower extremity Doppler studies pending.  History of DVT/PE on chronic anticoagulation Supposed to be taking Eliquis.  She has had some financial difficulties recently resulting in noncompliance.  Apparently was able to get some samples.  Lower extremity Doppler studies pending.   Looks like her PE was in 2019.  Doppler studies from 2019 showed bilateral DVT.  Doppler study from 2020 did not show any DVT.  Possibly on lifelong anticoagulation.  Resume Eliquis for now and will defer to outpatient providers.  Acute metabolic encephalopathy No focal neurological deficits noted on examination.  CT head is unremarkable.  Seems to be mentating normally currently.  Possibly secondary to her infections.  Urine drug screen was unremarkable.  History of alcohol dependence Unclear if this is current history or previous history of same.  Continue thiamine folic acid multivitamins.  CIWA protocol.  No evidence of withdrawal currently.  Essential hypertension Soft blood pressure readings noted.  Her antihypertensives are on hold.  Continue to monitor.  Home medication list shows only spironolactone which is currently on hold.  Normocytic anemia Drop in hemoglobin is likely dilutional.  No evidence of overt bleeding.  Continue to monitor.  Bipolar disorder with history of anxiety and depression Noted to be on trazodone, hydroxyzine, Neurontin at home.  Currently on hold due to her acute encephalopathy which seems to be improving.  Obesity Estimated body mass index is 33.3 kg/m as calculated from the following:   Height as of 10/01/18: 5\' 4"  (1.626 m).   Weight as of 10/01/18: 88 kg.    DVT Prophylaxis: Resume Eliquis  Code Status: Full code Family Communication: Discussed with the patient.  No family at bedside. Disposition Plan: Unclear if she is from independent living facility or  her own apartment.  PT and OT evaluation.  Status is: Observation  The patient will require care spanning > 2 midnights and should be moved to inpatient because: Need for IV antibiotics for UTI and cellulitis      Medications: Scheduled:  folic acid  1 mg Oral Daily   multivitamin with minerals  1 tablet Oral Daily   thiamine  100 mg Oral Daily   Or   thiamine  100 mg Intravenous Daily   Continuous:  cefTRIAXone (ROCEPHIN)  IV     lactated ringers 125 mL/hr at 01/13/21 2103   EVO:[id number] **OR** acetaminophen, LORazepam **OR** LORazepam  Antibiotics: Anti-infectives (From admission, onward)    Start     Dose/Rate Route Frequency Ordered Stop   01/14/21 1800  cefTRIAXone (ROCEPHIN) 1 g in sodium chloride 0.9 % 100 mL IVPB        1 g 200 mL/hr over 30 Minutes Intravenous Every 24 hours 01/13/21 2044     01/13/21 1830  cefTRIAXone (ROCEPHIN) 1 g in sodium chloride 0.9 % 100 mL IVPB        1 g 200 mL/hr over 30 Minutes Intravenous  Once 01/13/21 1826 01/13/21 1942       Objective:  Vital Signs  Vitals:   01/14/21 0600 01/14/21 0700 01/14/21 0730 01/14/21 0805  BP: 120/70 128/82 (!) 118/96 103/65  Pulse: 92 93 92 93  Resp: 15 19 15    Temp:      TempSrc:      SpO2: 95% 100% 92%     Intake/Output Summary (Last 24 hours) at 01/14/2021 7169 Last data filed at 01/14/2021 0315 Gross per 24 hour  Intake --  Output 1600 ml  Net -1600 ml   There were no vitals filed for this visit.  General appearance: Awake alert.  In no distress.  Mildly distracted Resp: Clear to auscultation bilaterally.  Normal effort Cardio: S1-S2 is normal regular.  No S3-S4.  No rubs murmurs or bruit GI: Abdomen is soft.  Nontender nondistended.  Bowel sounds are present normal.  No masses organomegaly Extremities: Both lower extremities noted to be swollen.  Good pulses present.  Erythema noted diffusely. Neurologic:  No focal neurological deficits.    Lab Results:  Data Reviewed:  I have personally reviewed following labs and imaging studies  CBC: Recent Labs  Lab 01/13/21 1507 01/14/21 0315  WBC 7.0 5.3  NEUTROABS 3.7  --   HGB 12.5 11.2*  HCT 38.9 34.7*  MCV 77.3* 78.2*  PLT 185 678    Basic Metabolic Panel: Recent Labs  Lab 01/13/21 1507  NA 136  K 3.8  CL 99  CO2 28  GLUCOSE 119*  BUN 5*  CREATININE 0.84  CALCIUM 8.8*    GFR: CrCl cannot be calculated (Unknown ideal weight.).  Liver Function Tests: Recent Labs  Lab 01/13/21 1507  AST 28  ALT 15  ALKPHOS 72  BILITOT 1.0  PROT 6.6  ALBUMIN 3.2*     Coagulation Profile: Recent Labs  Lab 01/13/21 2130  INR 1.4*      Recent Results (from the past 240 hour(s))  Resp Panel by RT-PCR (Flu A&B, Covid) Nasopharyngeal Swab     Status: None   Collection Time: 01/13/21  3:04 PM   Specimen: Nasopharyngeal Swab; Nasopharyngeal(NP) swabs in vial transport medium  Result Value Ref  Range Status   SARS Coronavirus 2 by RT PCR NEGATIVE NEGATIVE Final    Comment: (NOTE) SARS-CoV-2 target nucleic acids are NOT DETECTED.  The SARS-CoV-2 RNA is generally detectable in upper respiratory specimens during the acute phase of infection. The lowest concentration of SARS-CoV-2 viral copies this assay can detect is 138 copies/mL. A negative result does not preclude SARS-Cov-2 infection and should not be used as the sole basis for treatment or other patient management decisions. A negative result may occur with  improper specimen collection/handling, submission of specimen other than nasopharyngeal swab, presence of viral mutation(s) within the areas targeted by this assay, and inadequate number of viral copies(<138 copies/mL). A negative result must be combined with clinical observations, patient history, and epidemiological information. The expected result is Negative.  Fact Sheet for Patients:  EntrepreneurPulse.com.au  Fact Sheet for Healthcare Providers:   IncredibleEmployment.be  This test is no t yet approved or cleared by the [place] FDA and  has been authorized for detection and/or diagnosis of SARS-CoV-2 by FDA under an Emergency Use Authorization (EUA). This EUA will remain  in effect (meaning this test can be used) for the duration of the COVID-19 declaration under Section 564(b)(1) of the Act, 21 U.S.C.section 360bbb-3(b)(1), unless the authorization is terminated  or revoked sooner.       Influenza A by PCR NEGATIVE NEGATIVE Final   Influenza B by PCR NEGATIVE NEGATIVE Final    Comment: (NOTE) The Xpert Xpress SARS-CoV-2/FLU/RSV plus assay is intended as an aid in the diagnosis of influenza from Nasopharyngeal swab specimens and should not be used as a sole basis for treatment. Nasal washings and aspirates are unacceptable for Xpert Xpress SARS-CoV-2/FLU/RSV testing.  Fact Sheet for Patients: EntrepreneurPulse.com.au  Fact Sheet for Healthcare Providers: IncredibleEmployment.be  This test is not yet approved or cleared by the [place] FDA and has been authorized for detection and/or diagnosis of SARS-CoV-2 by FDA under an Emergency Use Authorization (EUA). This EUA will remain in effect (meaning this test can be used) for the duration of the COVID-19 declaration under Section 564(b)(1) of the Act, 21 U.S.C. section 360bbb-3(b)(1), unless the authorization is terminated or revoked.  Performed at [facility], [place] [address], [place], [place] [address]       Radiology Studies: CT HEAD WO CONTRAST (5MM)  Result Date: 01/13/2021 CLINICAL DATA:  80 year old female with altered mental status. EXAM: CT HEAD WITHOUT CONTRAST TECHNIQUE: Contiguous axial images were obtained from the base of the skull through the vertex without intravenous contrast. COMPARISON:  09/28/2017 MR/CT and prior studies FINDINGS: Brain: No evidence of acute infarction,  hemorrhage, hydrocephalus, extra-axial collection or mass lesion/mass effect. Embolization coils at the LEFT skull base are again noted, creating artifact. Atrophy and chronic small-vessel white matter ischemic changes are again noted. Vascular: Carotid atherosclerotic calcifications are noted. Skull: Normal. Negative for fracture or focal lesion. Sinuses/Orbits: No acute abnormality Other: None IMPRESSION: 1. No evidence of acute intracranial abnormality. 2. Atrophy and chronic small-vessel white matter ischemic changes. Electronically Signed   By: Margarette Canada M.D.   On: 01/13/2021 17:29   DG Chest Portable 1 View  Result Date: 01/13/2021 CLINICAL DATA:  Altered mental status. EXAM: PORTABLE CHEST 1 VIEW COMPARISON:  Chest x-ray 01/04/2018, CT chest 01/23/2018. FINDINGS: The heart and mediastinal contours are within unchanged. Bibasilar atelectasis. no focal consolidation. No pulmonary edema. No pleural effusion. No pneumothorax. No acute osseous abnormality. IMPRESSION: No active disease. Electronically Signed   By: Iven Finn  M.D.   On: 01/13/2021 15:35       LOS: 0 days   [facility] Pager on [email or web address]  01/14/2021, 8:23 AM

## 2021-01-15 ENCOUNTER — Encounter (HOSPITAL_COMMUNITY): Payer: Self-pay | Admitting: Internal Medicine

## 2021-01-15 DIAGNOSIS — N390 Urinary tract infection, site not specified: Secondary | ICD-10-CM | POA: Diagnosis not present

## 2021-01-15 DIAGNOSIS — G9341 Metabolic encephalopathy: Secondary | ICD-10-CM | POA: Diagnosis not present

## 2021-01-15 DIAGNOSIS — L03116 Cellulitis of left lower limb: Secondary | ICD-10-CM | POA: Diagnosis not present

## 2021-01-15 DIAGNOSIS — L03115 Cellulitis of right lower limb: Secondary | ICD-10-CM | POA: Diagnosis not present

## 2021-01-15 MED ORDER — SODIUM CHLORIDE 0.9 % IV SOLN
2.0000 g | INTRAVENOUS | Status: DC
  Administered 2021-01-15: 2 g via INTRAVENOUS
  Filled 2021-01-15: qty 20

## 2021-01-15 NOTE — NC FL2 (Signed)
[place] LEVEL OF CARE SCREENING TOOL     IDENTIFICATION  Patient Name: Denise Kelly Birthdate: 1940/04/26 Sex: female Admission Date (Current Location): 01/13/2021  [facility] and [place] Number:  Herbalist and Address:  [facility] [place] [address], [place], [place] [address]      Provider Number: [id number]  Attending Physician Name and Address:  Bonnielee Haff, MD  Relative Name and Phone Number:       Current Level of Care: Hospital Recommended Level of Care: [place] Prior Approval Number:    Date Approved/Denied:   PASRR Number: [id number]  Discharge Plan: SNF    Current Diagnoses: Patient Active Problem List   Diagnosis Date Noted   UTI (urinary tract infection) 01/75/1025   Acute metabolic encephalopathy 85/27/7824   Upper GI bleed 01/23/2018   Leukocytosis 01/23/2018   Physical deconditioning 01/23/2018   Acute pulmonary embolus ([place]) 01/04/2018   Acute encephalopathy 09/28/2017   Constipation, slow transit 09/28/2017   Unsteady gait 04/28/2017   Spondylolisthesis 07/17/2016   Bilateral lower extremity edema 07/17/2016   Chronic pain syndrome    Depression 05/23/2016   Chronic back pain 02/25/2016   Lesion of liver 02/25/2016   History of fracture of left hip 11/28/2015   Fall 11/28/2015   RLS (restless legs syndrome) 11/28/2015   Confusion 03/14/2014   Recurrent UTI 03/14/2014   Effusion of right knee 02/22/2014   Alcohol dependence ([place]) 02/22/2014   Substance induced mood disorder ([place]) 02/22/2014   Hepatic encephalopathy (HCC)    Altered mental status 02/21/2014   SIRS (systemic inflammatory response syndrome) ([place]) 02/21/2014   S/P total knee arthroplasty 01/02/2014   Essential hypertension, benign 11/10/2013   LAFB (left anterior fascicular block) 11/10/2013   Obesity, unspecified 11/10/2013    Orientation RESPIRATION BLADDER Height & Weight     Self, Time, Place   Normal Incontinent, External catheter Weight:   Height:  5\' 7"  (170.2 cm)  BEHAVIORAL SYMPTOMS/MOOD NEUROLOGICAL BOWEL NUTRITION STATUS      Continent Diet (Heart-healthy diet)  AMBULATORY STATUS COMMUNICATION OF NEEDS Skin   Total Care (ambulation deferred by patient) Verbally Other (Comment) (Excoriated groin, breat and abdomen areas)                       Personal Care Assistance Level of Assistance  Bathing, Feeding, Dressing Bathing Assistance: Maximum assistance (Mod assist upper body and max assist lower body) Feeding assistance: Limited assistance (Min assist) Dressing Assistance: Maximum assistance (Min assist upper  body and max assist lower body)     Functional Limitations Info  Sight, Hearing, Speech Sight Info: Impaired (Wears glassers) Hearing Info: Impaired Speech Info: Adequate    SPECIAL CARE FACTORS FREQUENCY  PT (By licensed PT), OT (By licensed OT)     PT Frequency: Evaluated 11/1 - PT at SNF eval and treat, a minimum of 5 days per week OT Frequency: Evaluated 11/1 - OT at SNF eval and treat, a minimum oif 5 days per week            Contractures Contractures Info: Not present    Additional Factors Info  Code Status, Allergies Code Status Info: Full Allergies Info: Sulfa Antibiotics, Coreg (Carvedilol), Motrin (Ibuprofen), Toprol Xl (Metoprolol Tartrate), Doxycycline Hyclate, Flovent Hfa (Fluticasone), Statins           Current Medications (01/15/2021):  This is the current hospital active medication list Current Facility-Administered Medications  Medication Dose Route  Frequency Provider Last Rate Last Admin   acetaminophen (TYLENOL) tablet 650 mg  650 mg Oral Q6H PRN Shela Leff, MD   650 mg at 01/14/21 1934   Or   acetaminophen (TYLENOL) suppository 650 mg  650 mg Rectal Q6H PRN Shela Leff, MD       apixaban Arne [place]) tablet 5 mg  5 mg Oral BID Bonnielee Haff, MD   5 mg at 01/15/21 1038   cefTRIAXone (ROCEPHIN) 2 g in sodium  chloride 0.9 % 100 mL IVPB  2 g Intravenous Q24H Pham, Minh Q, RPH-CPP       folic acid (FOLVITE) tablet 1 mg  1 mg Oral Daily Shela Leff, MD   1 mg at 01/15/21 1037   LORazepam (ATIVAN) tablet 1-4 mg  1-4 mg Oral Q1H PRN Shela Leff, MD   1 mg at 01/15/21 1038   Or   LORazepam (ATIVAN) injection 1-4 mg  1-4 mg Intravenous Q1H PRN Shela Leff, MD       multivitamin with minerals tablet 1 tablet  1 tablet Oral Daily Shela Leff, MD   1 tablet at 01/14/21 0911   ondansetron (ZOFRAN) injection 4 mg  4 mg Intravenous Q6H PRN Etta Quill, DO       pantoprazole (PROTONIX) EC tablet 40 mg  40 mg Oral Daily Bonnielee Haff, MD   40 mg at 01/15/21 1037   polyethylene glycol (MIRALAX / GLYCOLAX) packet 17 g  17 g Oral Daily PRN Bonnielee Haff, MD       pramipexole (MIRAPEX) tablet 0.5-1 mg  0.5-1 mg Oral QHS PRN Bonnielee Haff, MD       thiamine tablet 100 mg  100 mg Oral Daily Shela Leff, MD   100 mg at 01/15/21 1037   Or   thiamine (B-1) injection 100 mg  100 mg Intravenous Daily Shela Leff, MD         Discharge Medications: Please see discharge summary for a list of discharge medications.  Relevant Imaging Results:  Relevant Lab Results:   Additional Information ss# [id number].  Patient reported that she has had 2 COVID vaccinations and does not have the card with her.  Sable Feil, LCSW

## 2021-01-15 NOTE — Evaluation (Signed)
Occupational Therapy Evaluation Patient Details Name: Denise Kelly MRN: [id number] DOB: May 15, 1940 Today's Date: 01/15/2021   History of Present Illness Pt is a 80 y.o. F who presents 01/13/2021 with AMS, dysuria, urinary frequency, fever, and BLE edema. UA with positive nitrite and many bacteria. CT head negative for acute intracranial abnormality. Significant PMH: bipolar disorder, anxiety, depression, alcohol dependence, HTN, frequent UTIs, DVT/PE.   Clinical Impression   Pt was living alone, daughter assisted her with ADL and IADL, and she has a neighbor (23 y/o) who will run errands for her. Frequent falls even with a RW. Per chart review and patient her daughter just passed away via suicide. Pt demonstrated deficits in balance, activity tolerance, cognition, access to LB for ADL, and requires max A for LB ADL, min to set up for UB ADL, +2 assist for transfers (unable to tolerate more than 2 attempted sit<>stand today). Pt will require skilled OT in the acute setting and afterwards at the SNF level (had a very positive experience at [facility] previously) Next session focus on OOB activity as well as access to LB for ADL.      Recommendations for follow up therapy are one component of a multi-disciplinary discharge planning process, led by the attending physician.  Recommendations may be updated based on patient status, additional functional criteria and insurance authorization.   Follow Up Recommendations  Skilled nursing-short term rehab (<3 hours/day) (Pt had very positive experience at [facility])    Assistance Recommended at Discharge Frequent or constant Supervision/Assistance  Functional Status Assessment  Patient has had a recent decline in their functional status and demonstrates the ability to make significant improvements in function in a reasonable and predictable amount of time.  Equipment Recommendations  Other (comment);BSC;Tub/shower bench (defer to SNF)    Recommendations for  Other Services PT consult     Precautions / Restrictions Precautions Precautions: Fall Restrictions Weight Bearing Restrictions: No      Mobility Bed Mobility Overal bed mobility: Needs Assistance Bed Mobility: Supine to Sit;Sit to Supine     Supine to sit: Min guard Sit to supine: Min assist   General bed mobility comments: Pt able to negotiate to the right side of the bed with increased time, no physical assist required. MinA for LE management back into bed    Transfers                   General transfer comment: deferred by pt. Able to clear hips to laterally scoot up in the bed x 2      Balance Overall balance assessment: Needs assistance Sitting-balance support: Feet supported Sitting balance-Leahy Scale: Good Sitting balance - Comments: brushing teeth EOB                                   ADL either performed or assessed with clinical judgement   ADL Overall ADL's : Needs assistance/impaired Eating/Feeding: Minimal assistance Eating/Feeding Details (indicate cue type and reason): Pt found with food spilled on her Grooming: Wash/dry face;Oral care;Set up;Sitting Grooming Details (indicate cue type and reason): cues for task initiation Upper Body Bathing: Moderate assistance   Lower Body Bathing: Maximal assistance   Upper Body Dressing : Minimal assistance   Lower Body Dressing: Maximal assistance;Bed level Lower Body Dressing Details (indicate cue type and reason): to don socks Toilet Transfer: Minimal assistance;+2 for physical assistance;+2 for safety/equipment Toilet Transfer Details (indicate cue type and reason):  lateral slides up the bed, Pt implies it is very painful Toileting- Clothing Manipulation and Hygiene: Moderate assistance;Sit to/from stand       Functional mobility during ADLs: Minimal assistance;+2 for physical assistance;+2 for safety/equipment;Rolling walker (2 wheels) (unable to come to full stand due to pain and  "I just don't want to this moment") General ADL Comments: eyes frequently closed, lack of initiation for self-care tasks - but will perform when directed/encouraged     Vision Baseline Vision/History: 1 Wears glasses Ability to See in Adequate Light: 0 Adequate Patient Visual Report: No change from baseline Vision Assessment?: No apparent visual deficits     Perception     Praxis      Pertinent Vitals/Pain Pain Assessment: Faces Faces Pain Scale: Hurts even more Pain Location: posterior BLE's Pain Descriptors / Indicators: Tender Pain Intervention(s): Limited activity within patient's tolerance;Monitored during session;Repositioned     Hand Dominance Right   Extremity/Trunk Assessment Upper Extremity Assessment Upper Extremity Assessment: Generalized weakness   Lower Extremity Assessment Lower Extremity Assessment: Defer to PT evaluation RLE Deficits / Details: Erythema, increased edema LLE Deficits / Details: Erythema, increased edema       Communication Communication Communication: HOH   Cognition Arousal/Alertness: Awake/alert Behavior During Therapy: WFL for tasks assessed/performed Overall Cognitive Status: Impaired/Different from baseline Area of Impairment: Memory;Safety/judgement;Problem solving                     Memory: Decreased short-term memory   Safety/Judgement: Decreased awareness of safety;Decreased awareness of deficits   Problem Solving: Slow processing;Decreased initiation;Requires verbal cues General Comments: Pt reporting lack of sleep last night due to "bright lights and people screaming." Pt also shares her daughter recently passed by suicide. Pt is overall a poor historian in regards to her PLOF and home living. She reports she was at [facility] for an unknown period of time and "loved it there." Pt demonstrates anxious behaviors and fear of falling. She apologizes for "being crabby."     General Comments       Exercises      Shoulder Instructions      Home Living Family/patient expects to be discharged to:: Private residence Living Arrangements: Alone Available Help at Discharge: Neighbor Type of Home: Apartment Home Access: Stairs to enter [employer] of Steps: 1   Home Layout: One level     Bathroom Shower/Tub: Tub/shower unit         Home Equipment: Conservation officer, nature (2 wheels)   Additional Comments: Pt reports recently losing her daughter      Prior Functioning/Environment Prior Level of Function : Needs assist       Physical Assist : Mobility (physical);ADLs (physical) Mobility (physical): Gait ADLs (physical): Bathing;Dressing;IADLs (daughter was managing medicines, and driving her, has a neighbor boy that sometimes helps and runs errands for her as well) Mobility Comments: uses walker ADLs Comments: pt reports independent with ADL's, difficulty with tub transfers, pt neighbor (57 y.o.) brings her groceries.        OT Problem List: Decreased strength;Decreased activity tolerance;Impaired balance (sitting and/or standing);Decreased cognition;Decreased safety awareness;Decreased knowledge of use of DME or AE;Pain;Increased edema      OT Treatment/Interventions: Self-care/ADL training;Energy conservation;DME and/or AE instruction;Therapeutic activities;Cognitive remediation/compensation;Patient/family education;Balance training    OT Goals(Current goals can be found in the care plan section) Acute Rehab OT Goals Patient Stated Goal: To get back on my feet OT Goal Formulation: With patient Time For Goal Achievement: 01/29/21 Potential to Achieve Goals: Good ADL Goals  Pt Will Perform Grooming: with modified independence;sitting Pt Will Perform Upper Body Dressing: with set-up;sitting Pt Will Perform Lower Body Dressing: with min guard assist;sit to/from stand Pt Will Transfer to Toilet: with min guard assist;ambulating Pt Will Perform Toileting - Clothing Manipulation and  hygiene: with min guard assist;sit to/from stand Additional ADL Goal #1: Pt will perform bed mobility at supervision level prior to engaging in ADL  OT Frequency: Min 2X/week   Barriers to D/C: Decreased caregiver support          Co-evaluation PT/OT/SLP Co-Evaluation/Treatment: Yes Reason for Co-Treatment: Necessary to address cognition/behavior during functional activity;For patient/therapist safety;To address functional/ADL transfers PT goals addressed during session: Mobility/safety with mobility;Balance;Strengthening/ROM OT goals addressed during session: ADL's and self-care;Strengthening/ROM      AM-PAC OT "6 Clicks" Daily Activity     Outcome Measure Help from another person eating meals?: A Little Help from another person taking care of personal grooming?: A Little Help from another person toileting, which includes using toliet, bedpan, or urinal?: A Lot Help from another person bathing (including washing, rinsing, drying)?: A Lot Help from another person to put on and taking off regular upper body clothing?: A Little Help from another person to put on and taking off regular lower body clothing?: A Lot 6 Click Score: 15   End of Session Nurse Communication: Mobility status;Precautions  Activity Tolerance: Patient limited by fatigue;Patient limited by pain Patient left: in bed;with call bell/phone within reach;with bed alarm set  OT Visit Diagnosis: Unsteadiness on feet (R26.81);Muscle weakness (generalized) (M62.81);History of falling (Z91.81);Other abnormalities of gait and mobility (R26.89);Other symptoms and signs involving cognitive function                Time: 1655-3748 OT Time Calculation (min): 25 min Charges:  OT General Charges $OT Visit: 1 Visit OT Evaluation $OT Eval Moderate Complexity: [place] OTR/L Acute Rehabilitation Services Pager: [phone] Office: [place] 01/15/2021, 11:41 AM

## 2021-01-15 NOTE — Progress Notes (Signed)
New Admission Note:   Arrival Method: stretcher Mental Orientation: alert x 3 combative and yelling Telemetry: box 4 Assessment: Completed Skin: see flowsheet IV: none Pain: none Tubes: none Safety Measures: Safety Fall Prevention Plan has been discussed Admission: Completed [facility] [place] Orientation: Patient has been orientated to the room, unit and staff.  Family: none   Orders have been reviewed and implemented. Will continue to monitor the patient. Call light has been placed within reach and bed alarm has been activated.   Rockie Neighbours BSN, RN Phone number: [phone]

## 2021-01-15 NOTE — TOC Initial Note (Signed)
Transition of Care [facility]) - Initial/Assessment Note    Patient Details  Name: Denise Kelly MRN: [id number] Date of Birth: 05-Jan-1941  [facility]) CM/SW Contact:    Denise Feil, LCSW Phone Number: 01/15/2021, 4:01 PM  Clinical Narrative:  CSW talked with patient at bedside regarding her discharge disposition and recommendation of ST rehab. Denise Kelly was sitting up in bed and was awake, alert and oriented. During the conversation, she got confused about some things, but after a clear explanation, she would express understanding. Denise Kelly reported that she lives alone and her only child died 2 weeks ago. When asked, Denise Kelly responded that she has 2 nieces and a nephew, but they don't live in [place].   CSW explained SNF recommendation and when asked responded that she has not been to [place] rehab before. CSW explained what short-term rehab entailed and provided her with a list of facilities in [facility]. Also explained the process in getting responses from nursing facilities. Denise Kelly was provided with CSW's name and phone number.               Expected Discharge Plan: [place] Barriers to Discharge: Continued Medical Work up   Patient Goals and CMS Choice Patient states their goals for this hospitalization and ongoing recovery are:: Patient expressed agreement to [place] rehab CMS Medicare.gov Compare Post Acute Care list provided to:: Patient Choice offered to / list presented to : Patient  Expected Discharge Plan and Services Expected Discharge Plan: [place] In-house Referral: Clinical Social Work     Living arrangements for the past 2 months: Apartment                                      Prior Living Arrangements/Services Living arrangements for the past 2 months: Apartment Lives with:: Self Patient language and need for interpreter reviewed:: No Do you feel safe going back to the place where  you live?: No   Patient agreeable to [place] rehab  Need for Family Participation in Patient Care: No (Comment) Care giver support system in place?: No (comment)   Criminal Activity/Legal Involvement Pertinent to Current Situation/Hospitalization: No - Comment as needed  Activities of Daily Living Home Assistive Devices/Equipment: Eyeglasses, [place] Rile (specify type) ADL Screening (condition at time of admission) Patient's cognitive ability adequate to safely complete daily activities?: No Is the patient deaf or have difficulty hearing?: No Does the patient have difficulty seeing, even when wearing glasses/contacts?: No Does the patient have difficulty concentrating, remembering, or making decisions?: Yes Patient able to express need for assistance with ADLs?: Yes Does the patient have difficulty dressing or bathing?: Yes Independently performs ADLs?: Yes (appropriate for developmental age) Communication: Independent Dressing (OT): Needs assistance Is this a change from baseline?: Pre-admission baseline Grooming: Independent Feeding: Needs assistance Is this a change from baseline?: Pre-admission baseline Bathing: Needs assistance Is this a change from baseline?: Pre-admission baseline Toileting: Needs assistance Is this a change from baseline?: Pre-admission baseline In/Out Bed: Needs assistance Is this a change from baseline?: Pre-admission baseline Walks in Home: Needs assistance Is this a change from baseline?: Pre-admission baseline Does the patient have difficulty walking or climbing stairs?: Yes Weakness of Legs: Both Weakness of Arms/Hands: None  Permission Sought/Granted Permission sought to share information with : Other (comment) (Per patient her daughter recently died, and this was her only child. Has 2 neices and  a nephew that does not lives in [place].) [place] granted to share information with : No (Only child is deceased)              Emotional  Assessment Appearance:: Appears stated age Attitude/Demeanor/Rapport: Engaged Affect (typically observed): Appropriate Orientation: : Oriented to Self, Oriented to Place, Oriented to  Time, Oriented to Situation (Patient was able to talk with CSW, was confused at times) Alcohol / Substance Use: Tobacco Use, Alcohol Use, Illicit Drugs (Per H&P patient does not smoke, drink or use illicit drugs) Psych Involvement: No (comment)  Admission diagnosis:  Lower urinary tract infectious disease [N39.0] UTI (urinary tract infection) [N39.0] Patient Active Problem List   Diagnosis Date Noted   UTI (urinary tract infection) 53/64/6803   Acute metabolic encephalopathy 21/22/4825   Upper GI bleed 01/23/2018   Leukocytosis 01/23/2018   Physical deconditioning 01/23/2018   Acute pulmonary embolus ([place]) 01/04/2018   Acute encephalopathy 09/28/2017   Constipation, slow transit 09/28/2017   Unsteady gait 04/28/2017   Spondylolisthesis 07/17/2016   Bilateral lower extremity edema 07/17/2016   Chronic pain syndrome    Depression 05/23/2016   Chronic back pain 02/25/2016   Lesion of liver 02/25/2016   History of fracture of left hip 11/28/2015   Fall 11/28/2015   RLS (restless legs syndrome) 11/28/2015   Confusion 03/14/2014   Recurrent UTI 03/14/2014   Effusion of right knee 02/22/2014   Alcohol dependence ([place]) 02/22/2014   Substance induced mood disorder ([place]) 02/22/2014   Hepatic encephalopathy ([place])    Altered mental status 02/21/2014   SIRS (systemic inflammatory response syndrome) ([place]) 02/21/2014   S/P total knee arthroplasty 01/02/2014   Essential hypertension, benign 11/10/2013   LAFB (left anterior fascicular block) 11/10/2013   Obesity, unspecified 11/10/2013   PCP:  [name], MD (Inactive) Pharmacy:   [id number] [address], [place] [address] Phone: [phone] Fax: [phone]     Social Determinants of Health (SDOH)  Interventions  No SDOH interventions requested or needed at this time.  Readmission Risk Interventions No flowsheet data found.

## 2021-01-15 NOTE — Progress Notes (Signed)
[facility] PROGRESS NOTE   Denise Kelly JGG:[id number] DOB: 29-Nov-1940 DOA: 01/13/2021  PCP: [name], MD (Inactive)  Brief History/Interval Summary: 80 y.o. 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  In the ED, afebrile but tachycardic. Labs showing WBC 7.0, hemoglobin 12.5, platelet count 185k.  Sodium 136, potassium 3.8, chloride 99, bicarb 28, BUN 5, creatinine 0.8, glucose 119.  LFTs normal.  UA with positive nitrite and many bacteria.  Patient was hospitalized for management of urinary tract infection and bilateral lower extremity cellulitis.  Reason for Visit: Urinary tract infection and bilateral lower extremity cellulitis  Consultants: None  Procedures: Lower extremity Doppler studies negative for DVT    Subjective/Interval History: Patient mentions that she has generalized body ache.  Denies any chest pain shortness of breath.  Legs look better.  No nausea vomiting.       Assessment/Plan:  Urinary tract infection/sepsis present on admission Patient noted to be tachycardic and tachypneic at the time of admission.  She was however noted to be afebrile.  WBC was noted to be normal.  But patient was still complaining of dysuria and urinary frequency.  UA was noted to be abnormal.  Patient placed on ceftriaxone.  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  Wait on urine cultures to be reported.  Continue ceftriaxone for now.  Sepsis physiology has improved.  Lactic acid level was normal.  IV fluids discontinued.  Bilateral lower extremity cellulitis in the setting of chronic lymphedema Patient has chronic lymphedema with erythema at baseline.  However she was found to have worsening  erythema in both her legs.  Noted to be better today.  Doppler studies are negative for DVT.  Denies any pain.  Continue with current antibiotics for now.  WBC is noted to be normal.  She is afebrile.    History of DVT/PE on chronic anticoagulation Supposed to be taking Eliquis.  She has had some financial difficulties recently resulting in noncompliance.  Apparently was able to get some samples.  No DVT noted on current Doppler study. Looks like her PE was in 2019.  Doppler studies from 2019 showed bilateral DVT.  Doppler study from 2020 did not show any DVT.  Possibly on lifelong anticoagulation.   Eliquis has been resumed.  Will defer duration to outpatient providers.  Will need medication assistance at discharge.  Acute metabolic encephalopathy No focal neurological deficits noted on examination.  CT head is unremarkable.  Altered mentation could have been due to acute infection.  Urine drug screen was unremarkable.  Seems to be back to baseline now.    History of alcohol dependence Unclear if this is current history or previous history of same.  Continue thiamine folic acid multivitamins.  CIWA protocol.  No evidence for withdrawal currently.  Essential hypertension Soft blood pressure readings.  Noted to be only on spironolactone at home which was held.  Blood pressures have stabilized.  Continue to monitor.  No labs done today.  Will order for tomorrow morning.  Normocytic anemia Drop in hemoglobin is likely dilutional.  No evidence of overt bleeding.  Continue to monitor.  Bipolar disorder with history of anxiety and depression Noted to be on trazodone, hydroxyzine, Neurontin at home.  Currently on hold due to her acute encephalopathy which seems to  be improving.  Able to resume her home medications soon.  Will wait another 24 hours.  Obesity Estimated body mass index is 30.38 kg/m as calculated from the following:   Height as of this encounter: 5\' 7"  (1.702 m).   Weight as of  10/01/18: 88 kg.    DVT Prophylaxis: Eliquis Code Status: Full code Family Communication: Discussed with the patient.  No family at bedside. Disposition Plan: Unclear if she is from independent living facility or her own apartment.  PT and OT evaluation.  Status is: Inpatient  Remains inpatient appropriate because: Continued need for IV antibiotics.       Medications: Scheduled:  apixaban  5 mg Oral BID   folic acid  1 mg Oral Daily   multivitamin with minerals  1 tablet Oral Daily   pantoprazole  40 mg Oral Daily   thiamine  100 mg Oral Daily   Or   thiamine  100 mg Intravenous Daily   Continuous:  cefTRIAXone (ROCEPHIN)  IV     YKD:[id number] **OR** acetaminophen, LORazepam **OR** LORazepam, ondansetron (ZOFRAN) IV, polyethylene glycol, pramipexole  Antibiotics: Anti-infectives (From admission, onward)    Start     Dose/Rate Route Frequency Ordered Stop   01/15/21 1800  cefTRIAXone (ROCEPHIN) 2 g in sodium chloride 0.9 % 100 mL IVPB        2 g 200 mL/hr over 30 Minutes Intravenous Every 24 hours 01/15/21 1040     01/14/21 1800  cefTRIAXone (ROCEPHIN) 1 g in sodium chloride 0.9 % 100 mL IVPB  Status:  Discontinued        1 g 200 mL/hr over 30 Minutes Intravenous Every 24 hours 01/13/21 2044 01/15/21 1040   01/13/21 1830  cefTRIAXone (ROCEPHIN) 1 g in sodium chloride 0.9 % 100 mL IVPB        1 g 200 mL/hr over 30 Minutes Intravenous  Once 01/13/21 1826 01/13/21 1942       Objective:  Vital Signs  Vitals:   01/15/21 0041 01/15/21 0230 01/15/21 0508 01/15/21 0927  BP:  123/87 140/65 130/72  Pulse:  87 93 100  Resp:  19 18 17   Temp:  97.6 F (36.4 C) 97.6 F (36.4 C) 97.9 F (36.6 C)  TempSrc:  Axillary  Oral  SpO2:  94% 94% 92%  Height: 5\' 7"  (1.702 m)       Intake/Output Summary (Last 24 hours) at 01/15/2021 1111 Last data filed at 01/15/2021 0826 Gross per 24 hour  Intake 340 ml  Output 1900 ml  Net -1560 ml    There were no vitals filed for  this visit.   General appearance: Awake alert.  In no distress Resp: Clear to auscultation bilaterally.  Normal effort Cardio: S1-S2 is normal regular.  No S3-S4.  No rubs murmurs or bruit GI: Abdomen is soft.  Nontender nondistended.  Bowel sounds are present normal.  No masses organomegaly Extremities: Continues to have edema bilateral lower extremities but appears to be better today.  Erythema is much better today compared to yesterday. Neurologic:   No focal neurological deficits.    Lab Results:  Data Reviewed: I have personally reviewed following labs and imaging studies  CBC: Recent Labs  Lab 01/13/21 1507 01/14/21 0315  WBC 7.0 5.3  NEUTROABS 3.7  --   HGB 12.5 11.2*  HCT 38.9 34.7*  MCV 77.3* 78.2*  PLT 185 160     Basic Metabolic Panel: Recent Labs  Lab 01/13/21 1507  NA 136  K 3.8  CL 99  CO2 28  GLUCOSE 119*  BUN 5*  CREATININE 0.84  CALCIUM 8.8*     GFR: CrCl cannot be calculated (Unknown ideal weight.).  Liver Function Tests: Recent Labs  Lab 01/13/21 1507  AST 28  ALT 15  ALKPHOS 72  BILITOT 1.0  PROT 6.6  ALBUMIN 3.2*      Coagulation Profile: Recent Labs  Lab 01/13/21 2130  INR 1.4*       Recent Results (from the past 240 hour(s))  Resp Panel by RT-PCR (Flu A&B, Covid) Nasopharyngeal Swab     Status: None   Collection Time: 01/13/21  3:04 PM   Specimen: Nasopharyngeal Swab; Nasopharyngeal(NP) swabs in vial transport medium  Result Value Ref Range Status   SARS Coronavirus 2 by RT PCR NEGATIVE NEGATIVE Final    Comment: (NOTE) SARS-CoV-2 target nucleic acids are NOT DETECTED.  The SARS-CoV-2 RNA is generally detectable in upper respiratory specimens during the acute phase of infection. The lowest concentration of SARS-CoV-2 viral copies this assay can detect is 138 copies/mL. A negative result does not preclude SARS-Cov-2 infection and should not be used as the sole basis for treatment or other patient management  decisions. A negative result may occur with  improper specimen collection/handling, submission of specimen other than nasopharyngeal swab, presence of viral mutation(s) within the areas targeted by this assay, and inadequate number of viral copies(<138 copies/mL). A negative result must be combined with clinical observations, patient history, and epidemiological information. The expected result is Negative.  Fact Sheet for Patients:  EntrepreneurPulse.com.au  Fact Sheet for Healthcare Providers:  IncredibleEmployment.be  This test is no t yet approved or cleared by the [place] FDA and  has been authorized for detection and/or diagnosis of SARS-CoV-2 by FDA under an Emergency Use Authorization (EUA). This EUA will remain  in effect (meaning this test can be used) for the duration of the COVID-19 declaration under Section 564(b)(1) of the Act, 21 U.S.C.section 360bbb-3(b)(1), unless the authorization is terminated  or revoked sooner.       Influenza A by PCR NEGATIVE NEGATIVE Final   Influenza B by PCR NEGATIVE NEGATIVE Final    Comment: (NOTE) The Xpert Xpress SARS-CoV-2/FLU/RSV plus assay is intended as an aid in the diagnosis of influenza from Nasopharyngeal swab specimens and should not be used as a sole basis for treatment. Nasal washings and aspirates are unacceptable for Xpert Xpress SARS-CoV-2/FLU/RSV testing.  Fact Sheet for Patients: EntrepreneurPulse.com.au  Fact Sheet for Healthcare Providers: IncredibleEmployment.be  This test is not yet approved or cleared by the [place] FDA and has been authorized for detection and/or diagnosis of SARS-CoV-2 by FDA under an Emergency Use Authorization (EUA). This EUA will remain in effect (meaning this test can be used) for the duration of the COVID-19 declaration under Section 564(b)(1) of the Act, 21 U.S.C. section 360bbb-3(b)(1), unless the  authorization is terminated or revoked.  Performed at [facility], [place] [address], [place], [place] [address]   Culture, blood (x 2)     Status: None (Preliminary result)   Collection Time: 01/13/21  9:30 PM   Specimen: BLOOD  Result Value Ref Range Status   Specimen Description BLOOD SITE NOT SPECIFIED  Final   Special Requests   Final    BOTTLES DRAWN AEROBIC AND ANAEROBIC Blood Culture adequate volume   Culture   Final    NO GROWTH 1 DAY Performed at [facility], [place] [address], [place], [place] [address]  Report Status PENDING  Incomplete  Culture, blood (x 2)     Status: None (Preliminary result)   Collection Time: 01/13/21  9:40 PM   Specimen: BLOOD  Result Value Ref Range Status   Specimen Description BLOOD SITE NOT SPECIFIED  Final   Special Requests   Final    BOTTLES DRAWN AEROBIC AND ANAEROBIC Blood Culture adequate volume   Culture   Final    NO GROWTH 1 DAY Performed at [facility], [place] [address] [place] [address], [place], [place] [address]    Report Status PENDING  Incomplete       Radiology Studies: CT HEAD WO CONTRAST (5MM)  Result Date: 01/13/2021 CLINICAL DATA:  80 year old female with altered mental status. EXAM: CT HEAD WITHOUT CONTRAST TECHNIQUE: Contiguous axial images were obtained from the base of the skull through the vertex without intravenous contrast. COMPARISON:  09/28/2017 MR/CT and prior studies FINDINGS: Brain: No evidence of acute infarction, hemorrhage, hydrocephalus, extra-axial collection or mass lesion/mass effect. Embolization coils at the LEFT skull base are again noted, creating artifact. Atrophy and chronic small-vessel white matter ischemic changes are again noted. Vascular: Carotid atherosclerotic calcifications are noted. Skull: Normal. Negative for fracture or focal lesion. Sinuses/Orbits: No acute abnormality Other: None IMPRESSION: 1. No evidence of acute intracranial abnormality. 2. Atrophy and chronic small-vessel white  matter ischemic changes. Electronically Signed   By: Margarette Canada M.D.   On: 01/13/2021 17:29   DG Chest Portable 1 View  Result Date: 01/13/2021 CLINICAL DATA:  Altered mental status. EXAM: PORTABLE CHEST 1 VIEW COMPARISON:  Chest x-ray 01/04/2018, CT chest 01/23/2018. FINDINGS: The heart and mediastinal contours are within unchanged. Bibasilar atelectasis. no focal consolidation. No pulmonary edema. No pleural effusion. No pneumothorax. No acute osseous abnormality. IMPRESSION: No active disease. Electronically Signed   By: Iven Finn M.D.   On: 01/13/2021 15:35   VAS [place] LOWER EXTREMITY VENOUS (DVT)  Result Date: 01/14/2021  Lower Venous DVT Study Patient Name:  KATHRINE RIEVES  Date of Exam:   01/14/2021 Medical Rec #: [id number]        Accession #:    [id number] Date of Birth: 1940-09-09        Patient Gender: F Patient Age:   19 years Exam Location:  [facility] Procedure:      VAS [place] LOWER EXTREMITY VENOUS (DVT) Referring Phys: Wandra Feinstein RATHORE --------------------------------------------------------------------------------  Indications: Swelling, and Edema.  Limitations: Pain tolerance and poor ultrasound/tissue interface. Comparison Study: 01/06/18 prior Performing Technologist: Archie Patten RVS  Examination Guidelines: A complete evaluation includes B-mode imaging, spectral Doppler, color Doppler, and power Doppler as needed of all accessible portions of each vessel. Bilateral testing is considered an integral part of a complete examination. Limited examinations for reoccurring indications may be performed as noted. The reflux portion of the exam is performed with the patient in reverse Trendelenburg.  +---------+---------------+---------+-----------+----------+-------------------+ RIGHT    CompressibilityPhasicitySpontaneityPropertiesThrombus Aging      +---------+---------------+---------+-----------+----------+-------------------+ CFV      Full           Yes      Yes                                       +---------+---------------+---------+-----------+----------+-------------------+ SFJ      Full                                                             +---------+---------------+---------+-----------+----------+-------------------+  FV Prox  Full                                                             +---------+---------------+---------+-----------+----------+-------------------+ FV Mid   Full                                                             +---------+---------------+---------+-----------+----------+-------------------+ FV DistalFull                                                             +---------+---------------+---------+-----------+----------+-------------------+ PFV      Full                                                             +---------+---------------+---------+-----------+----------+-------------------+ POP                     Yes      Yes                  unable to tolerate                                                        compression         +---------+---------------+---------+-----------+----------+-------------------+ PTV                                                   Not well visualized +---------+---------------+---------+-----------+----------+-------------------+ PERO                                                  Not well visualized +---------+---------------+---------+-----------+----------+-------------------+   +---------+---------------+---------+-----------+----------+-------------------+ LEFT     CompressibilityPhasicitySpontaneityPropertiesThrombus Aging      +---------+---------------+---------+-----------+----------+-------------------+ CFV      Full           Yes      Yes                                      +---------+---------------+---------+-----------+----------+-------------------+ SFJ      Full                                                              +---------+---------------+---------+-----------+----------+-------------------+  FV Prox  Full                                                             +---------+---------------+---------+-----------+----------+-------------------+ FV Mid   Full                                                             +---------+---------------+---------+-----------+----------+-------------------+ FV DistalFull                                                             +---------+---------------+---------+-----------+----------+-------------------+ PFV      Full                                                             +---------+---------------+---------+-----------+----------+-------------------+ POP                     Yes      Yes                  unable to tolerate                                                        compression         +---------+---------------+---------+-----------+----------+-------------------+ PTV                                                   Not well visualized +---------+---------------+---------+-----------+----------+-------------------+ PERO                                                  Not well visualized +---------+---------------+---------+-----------+----------+-------------------+     Summary: BILATERAL: - No evidence of deep vein thrombosis seen in the lower extremities, bilaterally. -No evidence of popliteal cyst, bilaterally.   *See table(s) above for measurements and observations. Electronically signed by Orlie Pollen on 01/14/2021 at 2:30:31 PM.    Final        LOS: 1 day   [facility] Pager on [email or web address]  01/15/2021, 11:11 AM

## 2021-01-15 NOTE — Evaluation (Signed)
Physical Therapy Evaluation Patient Details Name: Denise Kelly MRN: [id number] DOB: 1940-05-28 Today's Date: 01/15/2021  History of Present Illness  Pt is a 80 y.o. F who presents 01/13/2021 with AMS, dysuria, urinary frequency, fever, and BLE edema. UA with positive nitrite and many bacteria. CT head negative for acute intracranial abnormality. Significant PMH: bipolar disorder, anxiety, depression, alcohol dependence, HTN, frequent UTIs, DVT/PE.  Clinical Impression  Prior to admission, pt lives alone in an apartment, uses a walker for mobility and states she is independent with ADL's. Pt presents with decreased functional mobility secondary to BLE edema/pain, generalized weakness, and fear of falling. Pt agreeable to assessment with increased encouragement. Pt requiring min assist for bed mobility. Able to sit edge of bed and brush her teeth with set up assist. Pt refusing to stand, despite +2 assist being available. She was able to perform lateral scoots up in the bed. Recommend SNF in light of deficits and decreased caregiver support in order to maximize functional mobility.      Recommendations for follow up therapy are one component of a multi-disciplinary discharge planning process, led by the attending physician.  Recommendations may be updated based on patient status, additional functional criteria and insurance authorization.  Follow Up Recommendations Skilled nursing-short term rehab (<3 hours/day)    Assistance Recommended at Discharge    Functional Status Assessment Patient has had a recent decline in their functional status and demonstrates the ability to make significant improvements in function in a reasonable and predictable amount of time.  Equipment Recommendations  None recommended by PT    Recommendations for Other Services       Precautions / Restrictions Precautions Precautions: Fall Restrictions Weight Bearing Restrictions: No      Mobility  Bed  Mobility Overal bed mobility: Needs Assistance Bed Mobility: Supine to Sit;Sit to Supine     Supine to sit: Min guard Sit to supine: Min assist   General bed mobility comments: Pt able to negotiate to the right side of the bed with increased time, no physical assist required. MinA for LE management back into bed    Transfers                   General transfer comment: deferred by pt. Able to clear hips to laterally scoot up in the bed x 2    Ambulation/Gait                Stairs            Wheelchair Mobility    Modified Rankin (Stroke Patients Only)       Balance Overall balance assessment: Needs assistance Sitting-balance support: Feet supported Sitting balance-Leahy Scale: Good Sitting balance - Comments: brushing teeth EOB                                     Pertinent Vitals/Pain Pain Assessment: Faces Faces Pain Scale: Hurts even more Pain Location: posterior BLE's Pain Descriptors / Indicators: Tender Pain Intervention(s): Limited activity within patient's tolerance;Monitored during session    Home Living Family/patient expects to be discharged to:: Private residence Living Arrangements: Alone Available Help at Discharge: Neighbor Type of Home: Apartment Home Access: Stairs to enter   Technical brewer of Steps: 1   Home Layout: One level Home Equipment: Conservation officer, nature (2 wheels) Additional Comments: Pt reports recently losing her daughter    Prior Function Prior Level of Function :  Needs assist             Mobility Comments: uses walker ADLs Comments: pt reports independent with ADL's, difficulty with tub transfers, pt neighbor (83 y.o.) brings her groceries.     Hand Dominance        Extremity/Trunk Assessment   Upper Extremity Assessment Upper Extremity Assessment: Defer to OT evaluation    Lower Extremity Assessment Lower Extremity Assessment: Generalized weakness;RLE deficits/detail;LLE  deficits/detail RLE Deficits / Details: Erythema, increased edema LLE Deficits / Details: Erythema, increased edema       Communication   Communication: HOH  Cognition Arousal/Alertness: Awake/alert Behavior During Therapy: WFL for tasks assessed/performed Overall Cognitive Status: Impaired/Different from baseline Area of Impairment: Memory;Safety/judgement;Problem solving                     Memory: Decreased short-term memory   Safety/Judgement: Decreased awareness of safety;Decreased awareness of deficits   Problem Solving: Slow processing;Decreased initiation;Requires verbal cues General Comments: Pt reporting lack of sleep last night due to "bright lights and people screaming." Pt also shares her daughter recently passed by suicide. Pt is overall a poor historian in regards to her PLOF and home living. She reports she was at [facility] for an unknown period of time and "loved it there." Pt demonstrates anxious behaviors and fear of falling. She apologizes for "being crabby."        General Comments      Exercises     Assessment/Plan    PT Assessment Patient needs continued PT services  PT Problem List Decreased strength;Decreased activity tolerance;Decreased balance;Decreased mobility;Decreased cognition;Decreased safety awareness;Pain       PT Treatment Interventions DME instruction;Gait training;Functional mobility training;Therapeutic activities;Therapeutic exercise;Balance training;Patient/family education    PT Goals (Current goals can be found in the Care Plan section)  Acute Rehab PT Goals Patient Stated Goal: less pain PT Goal Formulation: With patient Time For Goal Achievement: 01/29/21 Potential to Achieve Goals: Good    Frequency Min 2X/week   Barriers to discharge Decreased caregiver support      Co-evaluation PT/OT/SLP Co-Evaluation/Treatment: Yes Reason for Co-Treatment: For patient/therapist safety;To address functional/ADL transfers PT  goals addressed during session: Mobility/safety with mobility         AM-PAC PT "6 Clicks" Mobility  Outcome Measure Help needed turning from your back to your side while in a flat bed without using bedrails?: A Little Help needed moving from lying on your back to sitting on the side of a flat bed without using bedrails?: A Little Help needed moving to and from a bed to a chair (including a wheelchair)?: A Little Help needed standing up from a chair using your arms (e.g., wheelchair or bedside chair)?: A Little Help needed to walk in hospital room?: A Lot Help needed climbing 3-5 steps with a railing? : Total 6 Click Score: 15    End of Session   Activity Tolerance: Other (comment) (self limiting) Patient left: in bed;with call bell/phone within reach Nurse Communication: Mobility status PT Visit Diagnosis: Muscle weakness (generalized) (M62.81);Difficulty in walking, not elsewhere classified (R26.2)    Time: 6789-3810 PT Time Calculation (min) (ACUTE ONLY): 25 min   Charges:   PT Evaluation $PT Eval Moderate Complexity: 1 Mod          Wyona Almas, PT, DPT Acute Rehabilitation Services Pager [phone] Office [phone]   Deno Etienne 01/15/2021, 9:46 AM

## 2021-01-15 NOTE — Plan of Care (Signed)
  Problem: Pain Managment: Goal: General experience of comfort will improve Outcome: Progressing   

## 2021-01-15 NOTE — Plan of Care (Signed)
?  Problem: Coping: ?Goal: Level of anxiety will decrease ?Outcome: Progressing ?  ?Problem: Safety: ?Goal: Ability to remain free from injury will improve ?Outcome: Progressing ?  ?

## 2021-01-16 DIAGNOSIS — N390 Urinary tract infection, site not specified: Secondary | ICD-10-CM | POA: Diagnosis not present

## 2021-01-16 DIAGNOSIS — G9341 Metabolic encephalopathy: Secondary | ICD-10-CM | POA: Diagnosis not present

## 2021-01-16 LAB — BASIC METABOLIC PANEL
Anion gap: 10 (ref 5–15)
BUN: 7 mg/dL — ABNORMAL LOW (ref 8–23)
CO2: 24 mmol/L (ref 22–32)
Calcium: 8.4 mg/dL — ABNORMAL LOW (ref 8.9–10.3)
Chloride: 101 mmol/L (ref 98–111)
Creatinine, Ser: 0.8 mg/dL (ref 0.44–1.00)
GFR, Estimated: >60 mL/min (ref >60)
Glucose, Bld: 98 mg/dL (ref 70–99)
Potassium: 3.6 mmol/L (ref 3.5–5.1)
Sodium: 135 mmol/L (ref 135–145)

## 2021-01-16 LAB — CBC
HCT: 41.6 % (ref 36.0–46.0)
Hemoglobin: 13.7 g/dL (ref 12.0–15.0)
MCH: 25.3 pg — ABNORMAL LOW (ref 26.0–34.0)
MCHC: 32.9 g/dL (ref 30.0–36.0)
MCV: 76.8 fL — ABNORMAL LOW (ref 80.0–100.0)
Platelets: 161 10*3/uL (ref 150–400)
RBC: 5.42 MIL/uL — ABNORMAL HIGH (ref 3.87–5.11)
RDW: 14.8 % (ref 11.5–15.5)
WBC: 6.1 10*3/uL (ref 4.0–10.5)
nRBC: 0 % (ref 0.0–0.2)

## 2021-01-16 LAB — URINE CULTURE: Culture: >=100000 — AB

## 2021-01-16 LAB — MAGNESIUM: Magnesium: 1.8 mg/dL (ref 1.7–2.4)

## 2021-01-16 MED ORDER — CEPHALEXIN 500 MG PO CAPS
500.0000 mg | ORAL_CAPSULE | Freq: Three times a day (TID) | ORAL | Status: DC
  Administered 2021-01-16 – 2021-01-18 (×7): 500 mg via ORAL
  Filled 2021-01-16 (×7): qty 1

## 2021-01-16 MED ORDER — WHITE PETROLATUM EX OINT
TOPICAL_OINTMENT | CUTANEOUS | Status: AC
  Filled 2021-01-16: qty 28.35

## 2021-01-16 NOTE — TOC Progression Note (Signed)
Transition of Care [facility] - Progression Note    Patient Details  Name: Denise Kelly MRN: [id number] Date of Birth: 1940-03-20  Transition of Care [facility]) CM/SW Contact  Sharlet Salina Mila Homer, LCSW Phone Number: 01/16/2021, 5:14 PM  Clinical Narrative:  Talked with patient at the bedside twice today (11:20 am and 4:45 pm) regarding a SNF decision for rehab. Ms. Masri was given facility choices on 11/2 and the visits today were to get a decision. Ms. Crandle was informed that she is ready for discharge and a decision is needed, however she was unable to provide CSW with a facility choice. Revisited with patient this afternoon and provided an additional bed offer - [facility]. Ms. Tennison asked about [facility] and was reminded that they declined and gave her the reason why - unable to meet needs. Ms. Cogan called [facility] while CSW in the room and had to leave a message. CSW also called Olivia Mackie, admissions Mudlogger at [facility] while Ms. Nading was on the phone and left a message regarding patient. Ms. Wilhelmsen was again reminded that she is medically ready for discharge, however she wants to talk with admissions person at [facility].     CSW will f/u with [facility] on tomorrow (11/3) and talk with patient again regarding a facility choice.       Expected Discharge Plan: [place] Barriers to Discharge: Continued Medical Work up  Expected Discharge Plan and Services Expected Discharge Plan: [place] In-house Referral: Clinical Social Work     Living arrangements for the past 2 months: Apartment                                       Social Determinants of Health (SDOH) Interventions  No SDOH interventions requested or needed at this time.  Readmission Risk Interventions No flowsheet data found.

## 2021-01-16 NOTE — Progress Notes (Signed)
Patient ID: Denise Kelly, female   DOB: 1940/05/17, 80 y.o.   MRN: [id number]  PROGRESS NOTE    Denise Kelly  URK:[id number] DOB: 1940/04/28 DOA: 01/13/2021 PCP: [name], MD (Inactive)   Brief Narrative:  80 y.o. 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  She was found to have UTI and bilateral lower extremity cellulitis and started on antibiotics.  Lower extremity duplex studies were negative for DVT.  Assessment & Plan:   Sepsis: Present on admission UTI: Present on admission -Sepsis has resolved.  Has been on Rocephin.  Urine culture growing Klebsiella pneumoniae.  Switch Rocephin to Keflex.  Blood cultures negative so far. -Hemodynamically stable.  Bilateral lower extremity cellulitis -Lower extremity duplex studies were negative for DVT.  Cellulitis improving.  Antibiotics plan as above.  History of DVT/PE on chronic anticoagulation -Possibly on lifelong anticoagulation.  Continue Eliquis.  Acute metabolic encephalopathy -Possibly from UTI and cellulitis.  CT head was unremarkable.  Urine drug screen was unremarkable. -Possibly back to baseline now.  No focal neurologic deficits on exam on presentation  History of alcohol dependence -Continue CIWA protocol along with thiamine, folic acid and multivitamins.  No evidence of withdrawal currently.  Essential hypertension -Blood pressure stable.  Spironolactone on hold.  Bipolar disorder with history of anxiety and depression -Noted to be on trazodone, hydroxyzine, Neurontin at home.  On hold because of acute encephalopathy which seems to be improving.  Continue to hold for another 24 hours.  Obesity -Outpatient follow-up  Generalized  conditioning -PT recommends SNF placement.  Social worker following   DVT prophylaxis: Eliquis Code Status: Full Family Communication: None at bedside Disposition Plan: Status is: Inpatient  Remains inpatient appropriate because: Will need SNF placement  Currently medically stable for discharge  Consultants: None  Procedures: None  Antimicrobials:  Anti-infectives (From admission, onward)    Start     Dose/Rate Route Frequency Ordered Stop   01/16/21 1400  cephALEXin (KEFLEX) capsule 500 mg        500 mg Oral Every 8 hours 01/16/21 1004     01/15/21 1800  cefTRIAXone (ROCEPHIN) 2 g in sodium chloride 0.9 % 100 mL IVPB  Status:  Discontinued        2 g 200 mL/hr over 30 Minutes Intravenous Every 24 hours 01/15/21 1040 01/16/21 1003   01/14/21 1800  cefTRIAXone (ROCEPHIN) 1 g in sodium chloride 0.9 % 100 mL IVPB  Status:  Discontinued        1 g 200 mL/hr over 30 Minutes Intravenous Every 24 hours 01/13/21 2044 01/15/21 1040   01/13/21 1830  cefTRIAXone (ROCEPHIN) 1 g in sodium chloride 0.9 % 100 mL IVPB        1 g 200 mL/hr over 30 Minutes Intravenous  Once 01/13/21 1826 01/13/21 1942        Subjective: Patient is here examined at bedside.  Denies worsening shortness of breath, nausea, vomiting.  Feels slightly better.  Still feels weak.  Objective: Vitals:   01/15/21 0927 01/15/21 1620 01/15/21 2100 01/16/21 0529  BP: 130/72 123/77 139/83 (!) 147/97  Pulse: 100 (!) 107 (!) 104 96  Resp: 17 18 17 15   Temp: 97.9 F (36.6 C) 98 F (36.7 C) 98.6 F (37 C) 98.4 F (36.9 C)  TempSrc:  Oral   Oral  SpO2: 92% 91% 93% 94%  Height:        Intake/Output Summary (Last 24 hours) at 01/16/2021 1057 Last data filed at 01/16/2021 0900 Gross per 24 hour  Intake 1020 ml  Output 1400 ml  Net -380 ml   There were no vitals filed for this visit.  Examination:  General exam: Appears calm and comfortable.  Currently on room air. Respiratory system: Bilateral decreased  breath sounds at bases with some scattered crackles Cardiovascular system: S1 & S2 heard, mildly tachycardic Gastrointestinal system: Abdomen is nondistended, soft and nontender. Normal bowel sounds heard. Extremities: No cyanosis, clubbing; bilateral lower extremity edema present with mild lower extremity erythema present bilaterally   Data Reviewed: I have personally reviewed following labs and imaging studies  CBC: Recent Labs  Lab 01/13/21 1507 01/14/21 0315 01/16/21 0245  WBC 7.0 5.3 6.1  NEUTROABS 3.7  --   --   HGB 12.5 11.2* 13.7  HCT 38.9 34.7* 41.6  MCV 77.3* 78.2* 76.8*  PLT 185 160 063   Basic Metabolic Panel: Recent Labs  Lab 01/13/21 1507 01/16/21 0245  NA 136 135  K 3.8 3.6  CL 99 101  CO2 28 24  GLUCOSE 119* 98  BUN 5* 7*  CREATININE 0.84 0.80  CALCIUM 8.8* 8.4*  MG  --  1.8   GFR: CrCl cannot be calculated (Unknown ideal weight.). Liver Function Tests: Recent Labs  Lab 01/13/21 1507  AST 28  ALT 15  ALKPHOS 72  BILITOT 1.0  PROT 6.6  ALBUMIN 3.2*   No results for input(s): LIPASE, AMYLASE in the last 168 hours. No results for input(s): AMMONIA in the last 168 hours. Coagulation Profile: Recent Labs  Lab 01/13/21 2130  INR 1.4*   Cardiac Enzymes: No results for input(s): CKTOTAL, CKMB, CKMBINDEX, TROPONINI in the last 168 hours. BNP (last 3 results) No results for input(s): PROBNP in the last 8760 hours. HbA1C: No results for input(s): HGBA1C in the last 72 hours. CBG: No results for input(s): GLUCAP in the last 168 hours. Lipid Profile: No results for input(s): CHOL, HDL, LDLCALC, TRIG, CHOLHDL, LDLDIRECT in the last 72 hours. Thyroid Function Tests: No results for input(s): TSH, T4TOTAL, FREET4, T3FREE, THYROIDAB in the last 72 hours. Anemia Panel: No results for input(s): VITAMINB12, FOLATE, FERRITIN, TIBC, IRON, RETICCTPCT in the last 72 hours. Sepsis Labs: Recent Labs  Lab 01/13/21 2130 01/14/21 0329  LATICACIDVEN 1.2  0.8    Recent Results (from the past 240 hour(s))  Resp Panel by RT-PCR (Flu A&B, Covid) Nasopharyngeal Swab     Status: None   Collection Time: 01/13/21  3:04 PM   Specimen: Nasopharyngeal Swab; Nasopharyngeal(NP) swabs in vial transport medium  Result Value Ref Range Status   SARS Coronavirus 2 by RT PCR NEGATIVE NEGATIVE Final    Comment: (NOTE) SARS-CoV-2 target nucleic acids are NOT DETECTED.  The SARS-CoV-2 RNA is generally detectable in upper respiratory specimens during the acute phase of infection. The lowest concentration of SARS-CoV-2 viral copies this assay can detect is 138 copies/mL. A negative result does not preclude SARS-Cov-2 infection and should not be used as the sole basis for treatment or other patient management decisions. A negative result may occur with  improper specimen collection/handling, submission of specimen other than nasopharyngeal swab, presence of viral mutation(s) within the areas targeted by this assay, and inadequate number of viral copies(<138 copies/mL). A negative result must be combined with clinical observations, patient history, and epidemiological information.  The expected result is Negative.  Fact Sheet for Patients:  EntrepreneurPulse.com.au  Fact Sheet for Healthcare Providers:  IncredibleEmployment.be  This test is no t yet approved or cleared by the [place] FDA and  has been authorized for detection and/or diagnosis of SARS-CoV-2 by FDA under an Emergency Use Authorization (EUA). This EUA will remain  in effect (meaning this test can be used) for the duration of the COVID-19 declaration under Section 564(b)(1) of the Act, 21 U.S.C.section 360bbb-3(b)(1), unless the authorization is terminated  or revoked sooner.       Influenza A by PCR NEGATIVE NEGATIVE Final   Influenza B by PCR NEGATIVE NEGATIVE Final    Comment: (NOTE) The Xpert Xpress SARS-CoV-2/FLU/RSV plus assay is intended  as an aid in the diagnosis of influenza from Nasopharyngeal swab specimens and should not be used as a sole basis for treatment. Nasal washings and aspirates are unacceptable for Xpert Xpress SARS-CoV-2/FLU/RSV testing.  Fact Sheet for Patients: EntrepreneurPulse.com.au  Fact Sheet for Healthcare Providers: IncredibleEmployment.be  This test is not yet approved or cleared by the [place] FDA and has been authorized for detection and/or diagnosis of SARS-CoV-2 by FDA under an Emergency Use Authorization (EUA). This EUA will remain in effect (meaning this test can be used) for the duration of the COVID-19 declaration under Section 564(b)(1) of the Act, 21 U.S.C. section 360bbb-3(b)(1), unless the authorization is terminated or revoked.  Performed at [facility], [place] [address], [place], [place] [address]   Urine Culture     Status: Abnormal   Collection Time: 01/13/21  3:04 PM   Specimen: Urine, Clean Catch  Result Value Ref Range Status   Specimen Description URINE, CLEAN CATCH  Final   Special Requests   Final    NONE Performed at [facility], [place] [address] [place], [place] [address]    Culture >=100,000 COLONIES/mL KLEBSIELLA PNEUMONIAE (A)  Final   Report Status 01/16/2021 FINAL  Final   Organism ID, Bacteria KLEBSIELLA PNEUMONIAE (A)  Final      Susceptibility   Klebsiella pneumoniae - MIC*    AMPICILLIN RESISTANT Resistant     CEFAZOLIN <=4 SENSITIVE Sensitive     CEFEPIME <=0.12 SENSITIVE Sensitive     CEFTRIAXONE <=0.25 SENSITIVE Sensitive     CIPROFLOXACIN <=0.25 SENSITIVE Sensitive     GENTAMICIN <=1 SENSITIVE Sensitive     IMIPENEM <=0.25 SENSITIVE Sensitive     NITROFURANTOIN <=16 SENSITIVE Sensitive     TRIMETH/SULFA <=20 SENSITIVE Sensitive     AMPICILLIN/SULBACTAM <=2 SENSITIVE Sensitive     PIP/TAZO <=4 SENSITIVE Sensitive     * >=100,000 COLONIES/mL KLEBSIELLA PNEUMONIAE  Culture, blood (x 2)      Status: None (Preliminary result)   Collection Time: 01/13/21  9:30 PM   Specimen: BLOOD  Result Value Ref Range Status   Specimen Description BLOOD SITE NOT SPECIFIED  Final   Special Requests   Final    BOTTLES DRAWN AEROBIC AND ANAEROBIC Blood Culture adequate volume   Culture   Final    NO GROWTH 2 DAYS Performed at [facility], [address], [place], [place] [address]    Report Status PENDING  Incomplete  Culture, blood (x 2)     Status: None (Preliminary result)   Collection Time: 01/13/21  9:40 PM   Specimen: BLOOD  Result Value Ref Range Status   Specimen Description BLOOD SITE NOT SPECIFIED  Final   Special Requests   Final  BOTTLES DRAWN AEROBIC AND ANAEROBIC Blood Culture adequate volume   Culture   Final    NO GROWTH 2 DAYS Performed at [facility], [place] [address], [place], [place] [address]    Report Status PENDING  Incomplete         Radiology Studies: No results found.      Scheduled Meds:  apixaban  5 mg Oral BID   cephALEXin  500 mg Oral E4A   folic acid  1 mg Oral Daily   multivitamin with minerals  1 tablet Oral Daily   pantoprazole  40 mg Oral Daily   thiamine  100 mg Oral Daily   Or   thiamine  100 mg Intravenous Daily   Continuous Infusions:        Aline August, MD [facility] 01/16/2021, 10:57 AM

## 2021-01-16 NOTE — Plan of Care (Signed)

## 2021-01-17 DIAGNOSIS — G9341 Metabolic encephalopathy: Secondary | ICD-10-CM | POA: Diagnosis not present

## 2021-01-17 DIAGNOSIS — N390 Urinary tract infection, site not specified: Secondary | ICD-10-CM | POA: Diagnosis not present

## 2021-01-17 MED ORDER — APIXABAN 5 MG PO TABS: 5.0000 mg | ORAL_TABLET | Freq: Two times a day (BID) | ORAL | 0 refills | Status: DC

## 2021-01-17 MED ORDER — THIAMINE HCL 100 MG PO TABS: 100.0000 mg | ORAL_TABLET | Freq: Every day | ORAL | 0 refills | Status: DC

## 2021-01-17 MED ORDER — FOLIC ACID 1 MG PO TABS: 1.0000 mg | ORAL_TABLET | Freq: Every day | ORAL | 0 refills | Status: DC

## 2021-01-17 MED ORDER — ONDANSETRON HCL 4 MG PO TABS: 4.0000 mg | ORAL_TABLET | Freq: Three times a day (TID) | ORAL | 0 refills | Status: DC | PRN

## 2021-01-17 MED ORDER — CEPHALEXIN 500 MG PO CAPS: 500.0000 mg | ORAL_CAPSULE | Freq: Three times a day (TID) | ORAL | 0 refills | Status: AC

## 2021-01-17 MED ORDER — PANTOPRAZOLE SODIUM 40 MG PO TBEC: 40.0000 mg | DELAYED_RELEASE_TABLET | Freq: Every day | ORAL | Status: DC

## 2021-01-17 NOTE — Progress Notes (Signed)
PT Cancellation Note  Patient Details Name: Denise Kelly MRN: [id number] DOB: February 19, 1941   Cancelled Treatment:    Reason Eval/Treat Not Completed: Patient declined, no reason specified. PT attempted to see patient twice today for discharge. Pt refuses PT intervention twice, reporting all she wants to do is go home. PT questions the patient about caregiver support and she is unable to identify any reliable caregivers. Pt reports all of her friends have abandoned her at this time. She is unable to identify anyone to pick her up from the hospital. Pt appears confused and to have limited insight into her current deficits at this time. PT asks patient about the possibility of discharging to rehab, which she refuses. PT will continue to follow up to aide in reducing falls risk. PT would continue to recommend SNF placement at this time as the pt is unable to identify any caregiver support.   Zenaida Niece 01/17/2021, 2:45 PM

## 2021-01-17 NOTE — Discharge Summary (Signed)
Physician Discharge Summary  Denise Kelly YDX:[id number] DOB: January 27, 1941 DOA: 01/13/2021  PCP: Denise Small, MD (Inactive)  Admit date: 01/13/2021 Discharge date: 01/17/2021  Admitted From: Home Disposition: Home.  Refused SNF placement.  Recommendations for Outpatient Follow-up:  Follow up with PCP in 1 week with repeat CBC/BMP Follow up in ED if symptoms worsen or new appear   Home Health: Home health PT Equipment/Devices: None  Discharge Condition: Stable CODE STATUS: Full Diet recommendation: Heart healthy  Brief/Interim Summary: 80 y.o. 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  She was found to have UTI and bilateral lower extremity cellulitis and started on antibiotics.  Lower extremity duplex studies were negative for DVT.PT recommended SNF placement.  Patient refused SNF placement.  She has been transitioned to oral Keflex.  She will be discharged home on oral Keflex today with home health PT.  Discharge Diagnoses:   Sepsis: Present on admission UTI: Present on admission -Sepsis has resolved.  Has been on Rocephin.  Urine culture growing Klebsiella pneumoniae.  Rocephin has already been switched to Keflex.  Blood cultures negative so far. -Hemodynamically stable. -Discharge home today on oral Keflex for 3 more days.   Bilateral lower extremity cellulitis -Lower extremity duplex studies were negative for DVT.  Cellulitis improving.  Antibiotics plan as above.   History of DVT/PE on chronic anticoagulation -Possibly on lifelong anticoagulation.  Continue Eliquis.  Acute metabolic encephalopathy -Possibly from UTI and cellulitis.  CT head was unremarkable.  Urine drug screen was  unremarkable. -Possibly back to baseline now.  No focal neurologic deficits on exam on presentation -We will keep gabapentin and trazodone on hold on discharge   History of alcohol dependence -Continue CIWA protocol along with thiamine, folic acid and multivitamins.  No evidence of withdrawal currently.   Essential hypertension -Blood pressure stable.  Spironolactone on hold.  Bipolar disorder with history of anxiety and depression -Noted to be on trazodone, hydroxyzine, Neurontin at home.  On hold because of acute encephalopathy which seems to be improving.  Continue to hold for now.  Outpatient follow-up with PCP.   Obesity -Outpatient follow-up   Generalized conditioning -PT recommends SNF placement.  Patient refused SNF placement.  She will be discharged home with home health today.  Discharge Instructions  Discharge Instructions     Diet - low sodium heart healthy   Complete by: As directed    Increase activity slowly   Complete by: As directed       Allergies as of 01/17/2021       Reactions   Sulfa Antibiotics Hives, Swelling, Other (See Comments)   Reaction:  Facial/eye swelling    Coreg [carvedilol] Other (See Comments)   Memory loss   Motrin [ibuprofen] Palpitations   Toprol Xl [metoprolol Tartrate] Cough   Doxycycline Hyclate Other (See Comments)   HEARTBURN   Flovent Hfa [fluticasone] Itching   Statins Other (See Comments)   MENTAL STATUS CHANGE        Medication List     STOP taking these medications    acidophilus Caps capsule   ferrous sulfate 325 (65 FE) MG EC tablet   gabapentin 100 MG capsule Commonly known as: NEURONTIN   hydrOXYzine 25 MG tablet Commonly known as: ATARAX/VISTARIL   loratadine 10 MG tablet Commonly known as:  CLARITIN   ondansetron 4 MG disintegrating tablet Commonly known as: ZOFRAN-ODT Replaced by: ondansetron 4 MG tablet   Zinc Oxide 12.8 % ointment Commonly known as: TRIPLE PASTE       TAKE these  medications    acetaminophen 325 MG tablet Commonly known as: TYLENOL Take 2 tablets (650 mg total) by mouth every 6 (six) hours as needed for mild pain.   apixaban 5 MG Tabs tablet Commonly known as: ELIQUIS Take 1 tablet (5 mg total) by mouth 2 (two) times daily.   BISACODYL PO Take 2-3 tablets by mouth daily as needed (constipation). chewable   cephALEXin 500 MG capsule Commonly known as: KEFLEX Take 1 capsule (500 mg total) by mouth every 8 (eight) hours for 3 days.   diphenhydrAMINE 25 MG tablet Commonly known as: BENADRYL Take 50 mg by mouth every 6 (six) hours as needed for allergies.   folic acid 1 MG tablet Commonly known as: FOLVITE Take 1 tablet (1 mg total) by mouth daily. Start taking on: January 18, 2021   loperamide 2 MG capsule Commonly known as: IMODIUM Take 1 capsule (2 mg total) by mouth 2 (two) times daily as needed for diarrhea or loose stools.   ondansetron 4 MG tablet Commonly known as: ZOFRAN Take 1 tablet (4 mg total) by mouth every 8 (eight) hours as needed for nausea or vomiting. Replaces: ondansetron 4 MG disintegrating tablet   pantoprazole 40 MG tablet Commonly known as: Protonix Take 1 tablet (40 mg total) by mouth daily.   polyethylene glycol 17 g packet Commonly known as: MIRALAX / GLYCOLAX Take 17 g by mouth daily as needed for mild constipation.   pramipexole 0.5 MG tablet Commonly known as: MIRAPEX Take 0.5-1 mg by mouth at bedtime as needed (restless legs).   spironolactone 50 MG tablet Commonly known as: ALDACTONE Take 100 mg by mouth daily.   thiamine 100 MG tablet Take 1 tablet (100 mg total) by mouth daily. Start taking on: January 18, 2021   traZODone 100 MG tablet Commonly known as: DESYREL Take 100-200 mg by mouth at bedtime. What changed: Another medication with the same name was removed. Continue taking this medication, and follow the directions you see here.        Follow-up Information     Denise Small,  MD. Schedule an appointment as soon as possible for a visit in 1 week(s).   Specialty: Family Medicine Why: with repeat cbc/bmp Contact information: [address] [place] [address] [phone]                Allergies  Allergen Reactions   Sulfa Antibiotics Hives, Swelling and Other (See Comments)    Reaction:  Facial/eye swelling    Coreg [Carvedilol] Other (See Comments)    Memory loss   Motrin [Ibuprofen] Palpitations   Toprol Xl [Metoprolol Tartrate] Cough   Doxycycline Hyclate Other (See Comments)    HEARTBURN   Flovent Hfa [Fluticasone] Itching   Statins Other (See Comments)    MENTAL STATUS CHANGE    Consultations: None   Procedures/Studies: CT HEAD WO CONTRAST (5MM)  Result Date: 01/13/2021 CLINICAL DATA:  80 year old female with altered mental status. EXAM: CT HEAD WITHOUT CONTRAST TECHNIQUE: Contiguous axial images were obtained from the base of the skull through the vertex without intravenous contrast. COMPARISON:  09/28/2017 MR/CT and prior studies FINDINGS: Brain: No evidence of acute infarction, hemorrhage, hydrocephalus, extra-axial collection or mass lesion/mass effect. Embolization coils at the LEFT skull base  are again noted, creating artifact. Atrophy and chronic Kelly-vessel white matter ischemic changes are again noted. Vascular: Carotid atherosclerotic calcifications are noted. Skull: Normal. Negative for fracture or focal lesion. Sinuses/Orbits: No acute abnormality Other: None IMPRESSION: 1. No evidence of acute intracranial abnormality. 2. Atrophy and chronic Kelly-vessel white matter ischemic changes. Electronically Signed   By: Margarette Canada M.D.   On: 01/13/2021 17:29   DG Chest Portable 1 View  Result Date: 01/13/2021 CLINICAL DATA:  Altered mental status. EXAM: PORTABLE CHEST 1 VIEW COMPARISON:  Chest x-ray 01/04/2018, CT chest 01/23/2018. FINDINGS: The heart and mediastinal contours are within unchanged. Bibasilar  atelectasis. no focal consolidation. No pulmonary edema. No pleural effusion. No pneumothorax. No acute osseous abnormality. IMPRESSION: No active disease. Electronically Signed   By: Iven Finn M.D.   On: 01/13/2021 15:35   VAS [place] LOWER EXTREMITY VENOUS (DVT)  Result Date: 01/14/2021  Lower Venous DVT Study Patient Name:  Denise Kelly  Date of Exam:   01/14/2021 Medical Rec #: [id number]        Accession #:    [id number] Date of Birth: [birth year]        Patient Gender: F Patient Age:   80 years Exam Location:  [facility] Procedure:      VAS [place] LOWER EXTREMITY VENOUS (DVT) Referring Phys: Wandra Feinstein RATHORE --------------------------------------------------------------------------------  Indications: Swelling, and Edema.  Limitations: Pain tolerance and poor ultrasound/tissue interface. Comparison Study: 01/06/18 prior Performing Technologist: Archie Patten RVS  Examination Guidelines: A complete evaluation includes B-mode imaging, spectral Doppler, color Doppler, and power Doppler as needed of all accessible portions of each vessel. Bilateral testing is considered an integral part of a complete examination. Limited examinations for reoccurring indications may be performed as noted. The reflux portion of the exam is performed with the patient in reverse Trendelenburg.  +---------+---------------+---------+-----------+----------+-------------------+ RIGHT    CompressibilityPhasicitySpontaneityPropertiesThrombus Aging      +---------+---------------+---------+-----------+----------+-------------------+ CFV      Full           Yes      Yes                                      +---------+---------------+---------+-----------+----------+-------------------+ SFJ      Full                                                             +---------+---------------+---------+-----------+----------+-------------------+ FV Prox  Full                                                              +---------+---------------+---------+-----------+----------+-------------------+ FV Mid   Full                                                             +---------+---------------+---------+-----------+----------+-------------------+ FV DistalFull                                                             +---------+---------------+---------+-----------+----------+-------------------+  PFV      Full                                                             +---------+---------------+---------+-----------+----------+-------------------+ POP                     Yes      Yes                  unable to tolerate                                                        compression         +---------+---------------+---------+-----------+----------+-------------------+ PTV                                                   Not well visualized +---------+---------------+---------+-----------+----------+-------------------+ PERO                                                  Not well visualized +---------+---------------+---------+-----------+----------+-------------------+   +---------+---------------+---------+-----------+----------+-------------------+ LEFT     CompressibilityPhasicitySpontaneityPropertiesThrombus Aging      +---------+---------------+---------+-----------+----------+-------------------+ CFV      Full           Yes      Yes                                      +---------+---------------+---------+-----------+----------+-------------------+ SFJ      Full                                                             +---------+---------------+---------+-----------+----------+-------------------+ FV Prox  Full                                                             +---------+---------------+---------+-----------+----------+-------------------+ FV Mid   Full                                                              +---------+---------------+---------+-----------+----------+-------------------+ FV DistalFull                                                             +---------+---------------+---------+-----------+----------+-------------------+  PFV      Full                                                             +---------+---------------+---------+-----------+----------+-------------------+ POP                     Yes      Yes                  unable to tolerate                                                        compression         +---------+---------------+---------+-----------+----------+-------------------+ PTV                                                   Not well visualized +---------+---------------+---------+-----------+----------+-------------------+ PERO                                                  Not well visualized +---------+---------------+---------+-----------+----------+-------------------+     Summary: BILATERAL: - No evidence of deep vein thrombosis seen in the lower extremities, bilaterally. -No evidence of popliteal cyst, bilaterally.   *See table(s) above for measurements and observations. Electronically signed by Orlie Pollen on 01/14/2021 at 2:30:31 PM.    Final       Subjective: Patient is here examined at bedside.  No worsening shortness of breath, fever, nausea, vomiting reported.  Adamant about going home today.  Discharge Exam: Vitals:   01/17/21 0427 01/17/21 1012  BP: (!) 144/88 122/86  Pulse: (!) 106 (!) 102  Resp: 18 18  Temp: 97.6 F (36.4 C) 97.6 F (36.4 C)  SpO2: 94% (!) 89%    General exam: Still on room air.  No distress.   Respiratory system: Decreased breath sounds at bases bilaterally cardiovascular system: Intermittently tachycardic; S1-S2 heard Gastrointestinal system: Abdomen is slightly distended, soft and nontender.  Bowel sounds are heard.   Extremities: Trace lower extremity edema present  present with mild lower extremity erythema present bilaterally; no cyanosis    The results of significant diagnostics from this hospitalization (including imaging, microbiology, ancillary and laboratory) are listed below for reference.     Microbiology: Recent Results (from the past 240 hour(s))  Resp Panel by RT-PCR (Flu A&B, Covid) Nasopharyngeal Swab     Status: None   Collection Time: 01/13/21  3:04 PM   Specimen: Nasopharyngeal Swab; Nasopharyngeal(NP) swabs in vial transport medium  Result Value Ref Range Status   SARS Coronavirus 2 by RT PCR NEGATIVE NEGATIVE Final    Comment: (NOTE) SARS-CoV-2 target nucleic acids are NOT DETECTED.  The SARS-CoV-2 RNA is generally detectable in upper respiratory specimens during the acute phase of infection. The lowest concentration of SARS-CoV-2 viral copies this assay can detect is 138 copies/mL. A negative result  does not preclude SARS-Cov-2 infection and should not be used as the sole basis for treatment or other patient management decisions. A negative result may occur with  improper specimen collection/handling, submission of specimen other than nasopharyngeal swab, presence of viral mutation(s) within the areas targeted by this assay, and inadequate number of viral copies(<138 copies/mL). A negative result must be combined with clinical observations, patient history, and epidemiological information. The expected result is Negative.  Fact Sheet for Patients:  EntrepreneurPulse.com.au  Fact Sheet for Healthcare Providers:  IncredibleEmployment.be  This test is no t yet approved or cleared by the [place] FDA and  has been authorized for detection and/or diagnosis of SARS-CoV-2 by FDA under an Emergency Use Authorization (EUA). This EUA will remain  in effect (meaning this test can be used) for the duration of the COVID-19 declaration under Section 564(b)(1) of the Act, 21 U.S.C.section  360bbb-3(b)(1), unless the authorization is terminated  or revoked sooner.       Influenza A by PCR NEGATIVE NEGATIVE Final   Influenza B by PCR NEGATIVE NEGATIVE Final    Comment: (NOTE) The Xpert Xpress SARS-CoV-2/FLU/RSV plus assay is intended as an aid in the diagnosis of influenza from Nasopharyngeal swab specimens and should not be used as a sole basis for treatment. Nasal washings and aspirates are unacceptable for Xpert Xpress SARS-CoV-2/FLU/RSV testing.  Fact Sheet for Patients: EntrepreneurPulse.com.au  Fact Sheet for Healthcare Providers: IncredibleEmployment.be  This test is not yet approved or cleared by the [place] FDA and has been authorized for detection and/or diagnosis of SARS-CoV-2 by FDA under an Emergency Use Authorization (EUA). This EUA will remain in effect (meaning this test can be used) for the duration of the COVID-19 declaration under Section 564(b)(1) of the Act, 21 U.S.C. section 360bbb-3(b)(1), unless the authorization is terminated or revoked.  Performed at [facility], [place] [address], [place], [place] [address]   Urine Culture     Status: Abnormal   Collection Time: 01/13/21  3:04 PM   Specimen: Urine, Clean Catch  Result Value Ref Range Status   Specimen Description URINE, CLEAN CATCH  Final   Special Requests   Final    NONE Performed at [facility], [place] [address], [place], [place] [address]    Culture >=100,000 COLONIES/mL KLEBSIELLA PNEUMONIAE (A)  Final   Report Status 01/16/2021 FINAL  Final   Organism ID, Bacteria KLEBSIELLA PNEUMONIAE (A)  Final      Susceptibility   Klebsiella pneumoniae - MIC*    AMPICILLIN RESISTANT Resistant     CEFAZOLIN <=4 SENSITIVE Sensitive     CEFEPIME <=0.12 SENSITIVE Sensitive     CEFTRIAXONE <=0.25 SENSITIVE Sensitive     CIPROFLOXACIN <=0.25 SENSITIVE Sensitive     GENTAMICIN <=1 SENSITIVE Sensitive     IMIPENEM <=0.25 SENSITIVE Sensitive      NITROFURANTOIN <=16 SENSITIVE Sensitive     TRIMETH/SULFA <=20 SENSITIVE Sensitive     AMPICILLIN/SULBACTAM <=2 SENSITIVE Sensitive     PIP/TAZO <=4 SENSITIVE Sensitive     * >=100,000 COLONIES/mL KLEBSIELLA PNEUMONIAE  Culture, blood (x 2)     Status: None (Preliminary result)   Collection Time: 01/13/21  9:30 PM   Specimen: BLOOD  Result Value Ref Range Status   Specimen Description BLOOD SITE NOT SPECIFIED  Final   Special Requests   Final    BOTTLES DRAWN AEROBIC AND ANAEROBIC Blood Culture adequate volume   Culture   Final    NO GROWTH 3 DAYS  Performed at [facility], [place] [address], [place], [place] [address]    Report Status PENDING  Incomplete  Culture, blood (x 2)     Status: None (Preliminary result)   Collection Time: 01/13/21  9:40 PM   Specimen: BLOOD  Result Value Ref Range Status   Specimen Description BLOOD SITE NOT SPECIFIED  Final   Special Requests   Final    BOTTLES DRAWN AEROBIC AND ANAEROBIC Blood Culture adequate volume   Culture   Final    NO GROWTH 3 DAYS Performed at [facility], [address], [place], [place] [address]    Report Status PENDING  Incomplete     Labs: BNP (last 3 results) No results for input(s): BNP in the last 8760 hours. Basic Metabolic Panel: Recent Labs  Lab 01/13/21 1507 01/16/21 0245  NA 136 135  K 3.8 3.6  CL 99 101  CO2 28 24  GLUCOSE 119* 98  BUN 5* 7*  CREATININE 0.84 0.80  CALCIUM 8.8* 8.4*  MG  --  1.8   Liver Function Tests: Recent Labs  Lab 01/13/21 1507  AST 28  ALT 15  ALKPHOS 72  BILITOT 1.0  PROT 6.6  ALBUMIN 3.2*   No results for input(s): LIPASE, AMYLASE in the last 168 hours. No results for input(s): AMMONIA in the last 168 hours. CBC: Recent Labs  Lab 01/13/21 1507 01/14/21 0315 01/16/21 0245  WBC 7.0 5.3 6.1  NEUTROABS 3.7  --   --   HGB 12.5 11.2* 13.7  HCT 38.9 34.7* 41.6  MCV 77.3* 78.2* 76.8*  PLT 185 160 161   Cardiac Enzymes: No results for input(s):  CKTOTAL, CKMB, CKMBINDEX, TROPONINI in the last 168 hours. BNP: Invalid input(s): POCBNP CBG: No results for input(s): GLUCAP in the last 168 hours. D-Dimer No results for input(s): DDIMER in the last 72 hours. Hgb A1c No results for input(s): HGBA1C in the last 72 hours. Lipid Profile No results for input(s): CHOL, HDL, LDLCALC, TRIG, CHOLHDL, LDLDIRECT in the last 72 hours. Thyroid function studies No results for input(s): TSH, T4TOTAL, T3FREE, THYROIDAB in the last 72 hours.  Invalid input(s): FREET3 Anemia work up No results for input(s): VITAMINB12, FOLATE, FERRITIN, TIBC, IRON, RETICCTPCT in the last 72 hours. Urinalysis    Component Value Date/Time   COLORURINE AMBER (A) 01/13/2021 1504   APPEARANCEUR CLEAR 01/13/2021 1504   APPEARANCEUR Clear 09/19/2016 1633   LABSPEC 1.008 01/13/2021 1504   PHURINE 5.0 01/13/2021 1504   GLUCOSEU NEGATIVE 01/13/2021 1504   HGBUR Kelly (A) 01/13/2021 1504   BILIRUBINUR NEGATIVE 01/13/2021 1504   BILIRUBINUR Negative 09/19/2016 [place] 01/13/2021 1504   PROTEINUR NEGATIVE 01/13/2021 1504   UROBILINOGEN 1.0 11/30/2014 2105   NITRITE POSITIVE (A) 01/13/2021 1504   LEUKOCYTESUR NEGATIVE 01/13/2021 1504   Sepsis Labs Invalid input(s): PROCALCITONIN,  WBC,  LACTICIDVEN Microbiology Recent Results (from the past 240 hour(s))  Resp Panel by RT-PCR (Flu A&B, Covid) Nasopharyngeal Swab     Status: None   Collection Time: 01/13/21  3:04 PM   Specimen: Nasopharyngeal Swab; Nasopharyngeal(NP) swabs in vial transport medium  Result Value Ref Range Status   SARS Coronavirus 2 by RT PCR NEGATIVE NEGATIVE Final    Comment: (NOTE) SARS-CoV-2 target nucleic acids are NOT DETECTED.  The SARS-CoV-2 RNA is generally detectable in upper respiratory specimens during the acute phase of infection. The lowest concentration of SARS-CoV-2 viral copies this assay can detect is 138 copies/mL. A  negative result does not preclude  SARS-Cov-2 infection and should not be used as the sole basis for treatment or other patient management decisions. A negative result may occur with  improper specimen collection/handling, submission of specimen other than nasopharyngeal swab, presence of viral mutation(s) within the areas targeted by this assay, and inadequate number of viral copies(<138 copies/mL). A negative result must be combined with clinical observations, patient history, and epidemiological information. The expected result is Negative.  Fact Sheet for Patients:  EntrepreneurPulse.com.au  Fact Sheet for Healthcare Providers:  IncredibleEmployment.be  This test is no t yet approved or cleared by the [place] FDA and  has been authorized for detection and/or diagnosis of SARS-CoV-2 by FDA under an Emergency Use Authorization (EUA). This EUA will remain  in effect (meaning this test can be used) for the duration of the COVID-19 declaration under Section 564(b)(1) of the Act, 21 U.S.C.section 360bbb-3(b)(1), unless the authorization is terminated  or revoked sooner.       Influenza A by PCR NEGATIVE NEGATIVE Final   Influenza B by PCR NEGATIVE NEGATIVE Final    Comment: (NOTE) The Xpert Xpress SARS-CoV-2/FLU/RSV plus assay is intended as an aid in the diagnosis of influenza from Nasopharyngeal swab specimens and should not be used as a sole basis for treatment. Nasal washings and aspirates are unacceptable for Xpert Xpress SARS-CoV-2/FLU/RSV testing.  Fact Sheet for Patients: EntrepreneurPulse.com.au  Fact Sheet for Healthcare Providers: IncredibleEmployment.be  This test is not yet approved or cleared by the [place] FDA and has been authorized for detection and/or diagnosis of SARS-CoV-2 by FDA under an Emergency Use Authorization (EUA). This EUA will remain in effect (meaning this test can be used) for the duration of  the COVID-19 declaration under Section 564(b)(1) of the Act, 21 U.S.C. section 360bbb-3(b)(1), unless the authorization is terminated or revoked.  Performed at [facility], [place] [address], [place], [place] [address]   Urine Culture     Status: Abnormal   Collection Time: 01/13/21  3:04 PM   Specimen: Urine, Clean Catch  Result Value Ref Range Status   Specimen Description URINE, CLEAN CATCH  Final   Special Requests   Final    NONE Performed at [facility], [place] [address], [place], [place] [address]    Culture >=100,000 COLONIES/mL KLEBSIELLA PNEUMONIAE (A)  Final   Report Status 01/16/2021 FINAL  Final   Organism ID, Bacteria KLEBSIELLA PNEUMONIAE (A)  Final      Susceptibility   Klebsiella pneumoniae - MIC*    AMPICILLIN RESISTANT Resistant     CEFAZOLIN <=4 SENSITIVE Sensitive     CEFEPIME <=0.12 SENSITIVE Sensitive     CEFTRIAXONE <=0.25 SENSITIVE Sensitive     CIPROFLOXACIN <=0.25 SENSITIVE Sensitive     GENTAMICIN <=1 SENSITIVE Sensitive     IMIPENEM <=0.25 SENSITIVE Sensitive     NITROFURANTOIN <=16 SENSITIVE Sensitive     TRIMETH/SULFA <=20 SENSITIVE Sensitive     AMPICILLIN/SULBACTAM <=2 SENSITIVE Sensitive     PIP/TAZO <=4 SENSITIVE Sensitive     * >=100,000 COLONIES/mL KLEBSIELLA PNEUMONIAE  Culture, blood (x 2)     Status: None (Preliminary result)   Collection Time: 01/13/21  9:30 PM   Specimen: BLOOD  Result Value Ref Range Status   Specimen Description BLOOD SITE NOT SPECIFIED  Final   Special Requests   Final    BOTTLES DRAWN AEROBIC AND ANAEROBIC Blood Culture adequate volume   Culture   Final    NO GROWTH  3 DAYS Performed at [facility], [place] [address], [place], [place] [address]    Report Status PENDING  Incomplete  Culture, blood (x 2)     Status: None (Preliminary result)   Collection Time: 01/13/21  9:40 PM   Specimen: BLOOD  Result Value Ref Range Status   Specimen Description BLOOD SITE NOT SPECIFIED  Final   Special  Requests   Final    BOTTLES DRAWN AEROBIC AND ANAEROBIC Blood Culture adequate volume   Culture   Final    NO GROWTH 3 DAYS Performed at [facility], [address], [place], [place] [address]    Report Status PENDING  Incomplete     Time coordinating discharge: 35 minutes  SIGNED:   Aline August, MD  [facility] 01/17/2021, 1:21 PM

## 2021-01-17 NOTE — Progress Notes (Signed)
Patient ID: Denise Kelly, female   DOB: Mar 06, 1941, 80 y.o.   MRN: [id number]  PROGRESS NOTE    LIANNY MOLTER  HRC:[id number] DOB: 1940-12-14 DOA: 01/13/2021 PCP: [name], MD (Inactive)   Brief Narrative:  80 y.o. 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  She was found to have UTI and bilateral lower extremity cellulitis and started on antibiotics.  Lower extremity duplex studies were negative for DVT.  Assessment & Plan:   Sepsis: Present on admission UTI: Present on admission -Sepsis has resolved.  Has been on Rocephin.  Urine culture growing Klebsiella pneumoniae.  Rocephin has already been switched to Keflex.  Blood cultures negative so far. -Hemodynamically stable.  Bilateral lower extremity cellulitis -Lower extremity duplex studies were negative for DVT.  Cellulitis improving.  Antibiotics plan as above.  History of DVT/PE on chronic anticoagulation -Possibly on lifelong anticoagulation.  Continue Eliquis.  Acute metabolic encephalopathy -Possibly from UTI and cellulitis.  CT head was unremarkable.  Urine drug screen was unremarkable. -Possibly back to baseline now.  No focal neurologic deficits on exam on presentation  History of alcohol dependence -Continue CIWA protocol along with thiamine, folic acid and multivitamins.  No evidence of withdrawal currently.  Essential hypertension -Blood pressure stable.  Spironolactone on hold.  Bipolar disorder with history of anxiety and depression -Noted to be on trazodone, hydroxyzine, Neurontin at home.  On hold because of acute encephalopathy which seems to be improving.  Continue to hold for now.  Obesity -Outpatient follow-up  Generalized  conditioning -PT recommends SNF placement.  Social worker following   DVT prophylaxis: Eliquis Code Status: Full Family Communication: None at bedside Disposition Plan: Status is: Inpatient  Remains inpatient appropriate because: Will need SNF placement  Currently medically stable for discharge  Consultants: None  Procedures: None  Antimicrobials:  Anti-infectives (From admission, onward)    Start     Dose/Rate Route Frequency Ordered Stop   01/16/21 1400  cephALEXin (KEFLEX) capsule 500 mg        500 mg Oral Every 8 hours 01/16/21 1004     01/15/21 1800  cefTRIAXone (ROCEPHIN) 2 g in sodium chloride 0.9 % 100 mL IVPB  Status:  Discontinued        2 g 200 mL/hr over 30 Minutes Intravenous Every 24 hours 01/15/21 1040 01/16/21 1003   01/14/21 1800  cefTRIAXone (ROCEPHIN) 1 g in sodium chloride 0.9 % 100 mL IVPB  Status:  Discontinued        1 g 200 mL/hr over 30 Minutes Intravenous Every 24 hours 01/13/21 2044 01/15/21 1040   01/13/21 1830  cefTRIAXone (ROCEPHIN) 1 g in sodium chloride 0.9 % 100 mL IVPB        1 g 200 mL/hr over 30 Minutes Intravenous  Once 01/13/21 1826 01/13/21 1942        Subjective: Patient is here examined at bedside.  No worsening shortness of breath, fever, nausea, vomiting reported. Objective: Vitals:   01/16/21 1241 01/16/21 1745 01/16/21 2119 01/17/21 0427  BP: 111/69 109/73 133/68 (!) 144/88  Pulse: (!) 105 99 97 (!) 106  Resp: 16 18 16 18   Temp: 98.2 F (36.8 C) 97.6 F (36.4 C) 98.5 F (36.9 C) 97.6 F (36.4 C)  TempSrc: Oral Oral  Oral  SpO2:  95% 94% 93% 94%  Height:        Intake/Output Summary (Last 24 hours) at 01/17/2021 0727 Last data filed at 01/17/2021 0500 Gross per 24 hour  Intake 1017 ml  Output 2650 ml  Net -1633 ml    There were no vitals filed for this visit.  Examination:  General exam: Still on room air.  No distress.  Sleepy, wakes up only very slightly, hardly participates in any  conversation. Respiratory system: Decreased breath sounds at bases bilaterally cardiovascular system: Intermittently tachycardic; S1-S2 heard Gastrointestinal system: Abdomen is slightly distended, soft and nontender.  Bowel sounds are heard.   Extremities: Trace lower extremity edema present present with mild lower extremity erythema present bilaterally; no cyanosis   Data Reviewed: I have personally reviewed following labs and imaging studies  CBC: Recent Labs  Lab 01/13/21 1507 01/14/21 0315 01/16/21 0245  WBC 7.0 5.3 6.1  NEUTROABS 3.7  --   --   HGB 12.5 11.2* 13.7  HCT 38.9 34.7* 41.6  MCV 77.3* 78.2* 76.8*  PLT 185 160 672    Basic Metabolic Panel: Recent Labs  Lab 01/13/21 1507 01/16/21 0245  NA 136 135  K 3.8 3.6  CL 99 101  CO2 28 24  GLUCOSE 119* 98  BUN 5* 7*  CREATININE 0.84 0.80  CALCIUM 8.8* 8.4*  MG  --  1.8    GFR: CrCl cannot be calculated (Unknown ideal weight.). Liver Function Tests: Recent Labs  Lab 01/13/21 1507  AST 28  ALT 15  ALKPHOS 72  BILITOT 1.0  PROT 6.6  ALBUMIN 3.2*    No results for input(s): LIPASE, AMYLASE in the last 168 hours. No results for input(s): AMMONIA in the last 168 hours. Coagulation Profile: Recent Labs  Lab 01/13/21 2130  INR 1.4*    Cardiac Enzymes: No results for input(s): CKTOTAL, CKMB, CKMBINDEX, TROPONINI in the last 168 hours. BNP (last 3 results) No results for input(s): PROBNP in the last 8760 hours. HbA1C: No results for input(s): HGBA1C in the last 72 hours. CBG: No results for input(s): GLUCAP in the last 168 hours. Lipid Profile: No results for input(s): CHOL, HDL, LDLCALC, TRIG, CHOLHDL, LDLDIRECT in the last 72 hours. Thyroid Function Tests: No results for input(s): TSH, T4TOTAL, FREET4, T3FREE, THYROIDAB in the last 72 hours. Anemia Panel: No results for input(s): VITAMINB12, FOLATE, FERRITIN, TIBC, IRON, RETICCTPCT in the last 72 hours. Sepsis Labs: Recent Labs  Lab  01/13/21 2130 01/14/21 0329  LATICACIDVEN 1.2 0.8     Recent Results (from the past 240 hour(s))  Resp Panel by RT-PCR (Flu A&B, Covid) Nasopharyngeal Swab     Status: None   Collection Time: 01/13/21  3:04 PM   Specimen: Nasopharyngeal Swab; Nasopharyngeal(NP) swabs in vial transport medium  Result Value Ref Range Status   SARS Coronavirus 2 by RT PCR NEGATIVE NEGATIVE Final    Comment: (NOTE) SARS-CoV-2 target nucleic acids are NOT DETECTED.  The SARS-CoV-2 RNA is generally detectable in upper respiratory specimens during the acute phase of infection. The lowest concentration of SARS-CoV-2 viral copies this assay can detect is 138 copies/mL. A negative result does not preclude SARS-Cov-2 infection and should not be used as the sole basis for treatment or other patient management decisions. A negative result may occur with  improper specimen collection/handling, submission of specimen other than nasopharyngeal swab, presence of viral mutation(s) within the areas targeted by this assay, and inadequate number of viral copies(<138 copies/mL). A negative result must be combined  with clinical observations, patient history, and epidemiological information. The expected result is Negative.  Fact Sheet for Patients:  EntrepreneurPulse.com.au  Fact Sheet for Healthcare Providers:  IncredibleEmployment.be  This test is no t yet approved or cleared by the [place] FDA and  has been authorized for detection and/or diagnosis of SARS-CoV-2 by FDA under an Emergency Use Authorization (EUA). This EUA will remain  in effect (meaning this test can be used) for the duration of the COVID-19 declaration under Section 564(b)(1) of the Act, 21 U.S.C.section 360bbb-3(b)(1), unless the authorization is terminated  or revoked sooner.       Influenza A by PCR NEGATIVE NEGATIVE Final   Influenza B by PCR NEGATIVE NEGATIVE Final    Comment: (NOTE) The Xpert  Xpress SARS-CoV-2/FLU/RSV plus assay is intended as an aid in the diagnosis of influenza from Nasopharyngeal swab specimens and should not be used as a sole basis for treatment. Nasal washings and aspirates are unacceptable for Xpert Xpress SARS-CoV-2/FLU/RSV testing.  Fact Sheet for Patients: EntrepreneurPulse.com.au  Fact Sheet for Healthcare Providers: IncredibleEmployment.be  This test is not yet approved or cleared by the [place] FDA and has been authorized for detection and/or diagnosis of SARS-CoV-2 by FDA under an Emergency Use Authorization (EUA). This EUA will remain in effect (meaning this test can be used) for the duration of the COVID-19 declaration under Section 564(b)(1) of the Act, 21 U.S.C. section 360bbb-3(b)(1), unless the authorization is terminated or revoked.  Performed at [facility], [place] [address] [place], [place] [address]   Urine Culture     Status: Abnormal   Collection Time: 01/13/21  3:04 PM   Specimen: Urine, Clean Catch  Result Value Ref Range Status   Specimen Description URINE, CLEAN CATCH  Final   Special Requests   Final    NONE Performed at [facility], [place] [address], [place], [place] [address]    Culture >=100,000 COLONIES/mL KLEBSIELLA PNEUMONIAE (A)  Final   Report Status 01/16/2021 FINAL  Final   Organism ID, Bacteria KLEBSIELLA PNEUMONIAE (A)  Final      Susceptibility   Klebsiella pneumoniae - MIC*    AMPICILLIN RESISTANT Resistant     CEFAZOLIN <=4 SENSITIVE Sensitive     CEFEPIME <=0.12 SENSITIVE Sensitive     CEFTRIAXONE <=0.25 SENSITIVE Sensitive     CIPROFLOXACIN <=0.25 SENSITIVE Sensitive     GENTAMICIN <=1 SENSITIVE Sensitive     IMIPENEM <=0.25 SENSITIVE Sensitive     NITROFURANTOIN <=16 SENSITIVE Sensitive     TRIMETH/SULFA <=20 SENSITIVE Sensitive     AMPICILLIN/SULBACTAM <=2 SENSITIVE Sensitive     PIP/TAZO <=4 SENSITIVE Sensitive     * >=100,000 COLONIES/mL  KLEBSIELLA PNEUMONIAE  Culture, blood (x 2)     Status: None (Preliminary result)   Collection Time: 01/13/21  9:30 PM   Specimen: BLOOD  Result Value Ref Range Status   Specimen Description BLOOD SITE NOT SPECIFIED  Final   Special Requests   Final    BOTTLES DRAWN AEROBIC AND ANAEROBIC Blood Culture adequate volume   Culture   Final    NO GROWTH 3 DAYS Performed at [facility], [address] [place], [place] [address]    Report Status PENDING  Incomplete  Culture, blood (x 2)     Status: None (Preliminary result)   Collection Time: 01/13/21  9:40 PM   Specimen: BLOOD  Result Value Ref Range Status   Specimen Description BLOOD SITE NOT SPECIFIED  Final  Special Requests   Final    BOTTLES DRAWN AEROBIC AND ANAEROBIC Blood Culture adequate volume   Culture   Final    NO GROWTH 3 DAYS Performed at [facility], [place] [address], [place], [place] [address]    Report Status PENDING  Incomplete          Radiology Studies: No results found.      Scheduled Meds:  apixaban  5 mg Oral BID   cephALEXin  500 mg Oral X4J   folic acid  1 mg Oral Daily   multivitamin with minerals  1 tablet Oral Daily   pantoprazole  40 mg Oral Daily   thiamine  100 mg Oral Daily   Or   thiamine  100 mg Intravenous Daily   Continuous Infusions:        Aline August, MD [facility] 01/17/2021, 7:27 AM

## 2021-01-17 NOTE — Progress Notes (Signed)
Patient supposed to be discharged tonight to be picked up by friend,Emma Brame. Call made to [facility] to inquire what time she's coming to pick up patient but was told that she will come tomorrow. This RN informed her that the understanding was for her to come pick her up tonight. According to her,she's unable to come tonight. [place],MD notified via secure chat.
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
## 2021-01-17 NOTE — Progress Notes (Signed)
TRH night cross cover note:  I was notified by RN that this patient was intended to be discharged to home tonight, but that the patient's friend, who was supposed to be her ride home, is unavailable to pick her up this evening.  Consequently, per request, I have canceled the existing order for discharge to home.    Babs Bertin, DO Hospitalist

## 2021-01-17 NOTE — Progress Notes (Signed)
OT Cancellation Note  Patient Details Name: Denise Kelly MRN: [id number] DOB: 1941-01-11   Cancelled Treatment:    Reason Eval/Treat Not Completed: Patient declined, no reason specified (Patient stated she would rather be left alone.  Patient states she is trying to go home.  Will attempt again later if time allows.) Lodema Hong, [place]  Pager [phone] Office [place] 01/17/2021, 12:27 PM

## 2021-01-17 NOTE — TOC Transition Note (Signed)
Transition of Care [facility]) - CM/SW Discharge Note   Patient Details  Name: Denise Kelly MRN: [id number] Date of Birth: January 26, 1941  Transition of Care [facility]) CM/SW Contact:  Tom-Johnson, Renea Ee, RN Phone Number: 01/17/2021, 4:10 PM   Clinical Narrative:    CM consulted for home health PT/OT services. Patient declined SNF. CM spoke with patient at bedside. Patient states she has used [facility] before and would like to use their services again and gave number to call. CM called [facility] and spoke with Joelene Millin who states patient has used their services but was for medical. They do not do home health. CM notified patient and gave her choices from Medicare.gov and patient chose [place]. Referral for home health PT/OT made through [facility] with Alvis Lemmings [phone]). Patient states her friend, Maryan Rued will transport at discharge. CM called Terrence Dupont to verify and Terrence Dupont states she will transport patient after her 5 pm appointment. Patient and nurse notified. Patient denies TOC needs. No further TOC needs noted   Final next level of care: [place] Barriers to Discharge: No Barriers Identified   Patient Goals and CMS Choice Patient states their goals for this hospitalization and ongoing recovery are:: To go home CMS Medicare.gov Compare Post Acute Care list provided to:: Patient Choice offered to / list presented to : Patient  Discharge Placement                       Discharge Plan and Services In-house Referral: Clinical Social Work              DME Arranged: N/A DME Agency: NA       HH Arranged: PT, OT [place] Agency: [place] Date [place]: 01/17/21 Time [place]: 3159 Representative spoke with at [place]: [place] ([place]) Interventions     Readmission Risk Interventions No flowsheet data found.

## 2021-01-17 NOTE — Care Management Important Message (Signed)
Important Message  Patient Details  Name: Denise Kelly MRN: [id number] Date of Birth: 11-Apr-1940   Medicare Important Message Given:  Yes     Orbie Pyo 01/17/2021, 3:03 PM

## 2021-01-17 NOTE — Progress Notes (Signed)
Patient scheduled for discharge tonight. Friend will be picking her up late. This RN reminded patient of her discharge and to help her dress up. Patient stated, "I don't care who is picking me up. I am not leaving until tomorrow".  Charge nurse informed.

## 2021-01-18 MED ORDER — METHOCARBAMOL 500 MG PO TABS
500.0000 mg | ORAL_TABLET | Freq: Four times a day (QID) | ORAL | Status: DC | PRN
  Administered 2021-01-18: 500 mg via ORAL
  Filled 2021-01-18: qty 1

## 2021-01-18 MED ORDER — ONDANSETRON HCL 4 MG PO TABS
4.0000 mg | ORAL_TABLET | Freq: Three times a day (TID) | ORAL | Status: DC | PRN
  Administered 2021-01-18: 4 mg via ORAL
  Filled 2021-01-18: qty 1

## 2021-01-18 NOTE — TOC Transition Note (Signed)
Transition of Care [facility]) - CM/SW Discharge Note   Patient Details  Name: COREY CAULFIELD MRN: [id number] Date of Birth: [birth year]  Transition of Care [facility]) CM/SW Contact:  Tom-Johnson, Renea Ee, RN Phone Number: 01/18/2021, 10:07 AM   Clinical Narrative:    CM called patient's friend Maryan Rued [phone]) to verify transportation today. Terrence Dupont states she will pick up patient at 3:30 pm. MD and Nurse notified. No further TOC needs noted at this time.    Final next level of care: [place] Barriers to Discharge: No Barriers Identified   Patient Goals and CMS Choice Patient states their goals for this hospitalization and ongoing recovery are:: To go home CMS Medicare.gov Compare Post Acute Care list provided to:: Patient Choice offered to / list presented to : Patient  Discharge Placement                       Discharge Plan and Services In-house Referral: Clinical Social Work              DME Arranged: N/A DME Agency: NA       HH Arranged: PT, OT [place] Agency: [place] Date [place]: 01/17/21 Time [place]: 4158 Representative spoke with at [place]: [place] ([place]) Interventions     Readmission Risk Interventions No flowsheet data found.

## 2021-01-18 NOTE — Progress Notes (Signed)
Patient being discharged. Paperwork reviewed with patient including medications. Patient verbalizes an understanding of discharge instructions.

## 2021-01-18 NOTE — Progress Notes (Signed)
PT Cancellation Note  Patient Details Name: KONI KANNAN MRN: [id number] DOB: 07-24-1940   Cancelled Treatment:    Reason Eval/Treat Not Completed: Patient declined, no reason specified. PT attempts to see pt today for PT, pt declining again, requesting PT to leave pt alone. PT will follow up as time allows.   Zenaida Niece 01/18/2021, 10:33 AM

## 2021-01-18 NOTE — Progress Notes (Signed)
Patient ID: Denise Kelly, female   DOB: 02/05/1941, 80 y.o.   MRN: [id number] Patient was supposed to be discharged home on 01/17/2021 but her friend could not pick her up.  Patient is currently medically stable for discharge.  Please refer to the full discharge summary done by me on 01/17/2021 for full details.

## 2021-01-19 LAB — CULTURE, BLOOD (ROUTINE X 2)
Culture: NO GROWTH
Culture: NO GROWTH
Special Requests: ADEQUATE
Special Requests: ADEQUATE

## 2021-08-22 DIAGNOSIS — I480 Paroxysmal atrial fibrillation: Secondary | ICD-10-CM | POA: Diagnosis not present

## 2022-01-15 DIAGNOSIS — M5459 Other low back pain: Secondary | ICD-10-CM | POA: Diagnosis not present

## 2022-01-16 DIAGNOSIS — R002 Palpitations: Secondary | ICD-10-CM | POA: Diagnosis not present

## 2022-01-16 DIAGNOSIS — Z131 Encounter for screening for diabetes mellitus: Secondary | ICD-10-CM | POA: Diagnosis not present

## 2022-01-16 DIAGNOSIS — R1032 Left lower quadrant pain: Secondary | ICD-10-CM | POA: Diagnosis not present

## 2022-01-16 DIAGNOSIS — H9113 Presbycusis, bilateral: Secondary | ICD-10-CM | POA: Diagnosis not present

## 2022-01-16 DIAGNOSIS — E7800 Pure hypercholesterolemia, unspecified: Secondary | ICD-10-CM | POA: Diagnosis not present

## 2022-01-16 DIAGNOSIS — R5383 Other fatigue: Secondary | ICD-10-CM | POA: Diagnosis not present

## 2022-01-16 DIAGNOSIS — D485 Neoplasm of uncertain behavior of skin: Secondary | ICD-10-CM | POA: Diagnosis not present

## 2022-01-16 DIAGNOSIS — I10 Essential (primary) hypertension: Secondary | ICD-10-CM | POA: Diagnosis not present

## 2022-01-16 DIAGNOSIS — Z79899 Other long term (current) drug therapy: Secondary | ICD-10-CM | POA: Diagnosis not present

## 2022-01-16 DIAGNOSIS — R42 Dizziness and giddiness: Secondary | ICD-10-CM | POA: Diagnosis not present

## 2022-01-27 DIAGNOSIS — Z0000 Encounter for general adult medical examination without abnormal findings: Secondary | ICD-10-CM | POA: Diagnosis not present

## 2022-04-05 ENCOUNTER — Emergency Department (HOSPITAL_COMMUNITY)
Admission: EM | Admit: 2022-04-05 | Discharge: 2022-04-06 | Disposition: A | Discharge status: Home / Self Care | Payer: Medicare Other | Attending: Emergency Medicine | Admitting: Emergency Medicine

## 2022-04-05 ENCOUNTER — Other Ambulatory Visit: Payer: Self-pay

## 2022-04-05 ENCOUNTER — Encounter (HOSPITAL_COMMUNITY): Payer: Self-pay

## 2022-04-05 ENCOUNTER — Emergency Department (HOSPITAL_COMMUNITY): Payer: Medicare Other

## 2022-04-05 DIAGNOSIS — L03116 Cellulitis of left lower limb: Secondary | ICD-10-CM | POA: Insufficient documentation

## 2022-04-05 DIAGNOSIS — R609 Edema, unspecified: Secondary | ICD-10-CM

## 2022-04-05 DIAGNOSIS — Z7901 Long term (current) use of anticoagulants: Secondary | ICD-10-CM

## 2022-04-05 DIAGNOSIS — R7309 Other abnormal glucose: Secondary | ICD-10-CM | POA: Insufficient documentation

## 2022-04-05 DIAGNOSIS — Z86711 Personal history of pulmonary embolism: Secondary | ICD-10-CM | POA: Insufficient documentation

## 2022-04-05 DIAGNOSIS — R079 Chest pain, unspecified: Secondary | ICD-10-CM | POA: Diagnosis not present

## 2022-04-05 DIAGNOSIS — R600 Localized edema: Secondary | ICD-10-CM | POA: Insufficient documentation

## 2022-04-05 DIAGNOSIS — R000 Tachycardia, unspecified: Secondary | ICD-10-CM | POA: Diagnosis not present

## 2022-04-05 DIAGNOSIS — I10 Essential (primary) hypertension: Secondary | ICD-10-CM | POA: Diagnosis not present

## 2022-04-05 DIAGNOSIS — L03115 Cellulitis of right lower limb: Secondary | ICD-10-CM | POA: Diagnosis not present

## 2022-04-05 DIAGNOSIS — M7989 Other specified soft tissue disorders: Secondary | ICD-10-CM | POA: Diagnosis not present

## 2022-04-05 DIAGNOSIS — J9811 Atelectasis: Secondary | ICD-10-CM | POA: Diagnosis not present

## 2022-04-05 DIAGNOSIS — L03119 Cellulitis of unspecified part of limb: Secondary | ICD-10-CM

## 2022-04-05 NOTE — ED Triage Notes (Signed)
Pt arrives [facility] from home for bilateral lower extremity pain and lymph edema - worsening symptoms over the last 5 days. Erythema noted bilaterally. Pt denies fever.

## 2022-04-05 NOTE — ED Provider Notes (Signed)
[facility] EMERGENCY DE[facility]MENT AT [facility] Provider Note   CSN: [id number] Arrival date & time: 04/05/22  2301     History  Chief Complaint  Patient presents with   Leg Pain   Leg Swelling    Denise Kelly is a 82 y.o. female.  The history is provided by the patient.  Leg Pain She has history of hypertension, pulmonary embolism anticoagulated on apixaban, lymphedema and has noted increased swelling and pain and redness in her legs over the last week.  She denies fever or chills and denies shortness of breath.   Home Medications Prior to Admission medications   Medication Sig Start Date End Date Taking? Authorizing Provider  acetaminophen (TYLENOL) 325 MG tablet Take 2 tablets (650 mg total) by mouth every 6 (six) hours as needed for mild pain. 01/25/18   Elgergawy, Silver Huguenin, MD  apixaban (ELIQUIS) 5 MG TABS tablet Take 1 tablet (5 mg total) by mouth 2 (two) times daily. 01/17/21 02/16/21  Aline August, MD  BISACODYL PO Take 2-3 tablets by mouth daily as needed (constipation). chewable    [provider]  diphenhydrAMINE (BENADRYL) 25 MG tablet Take 50 mg by mouth every 6 (six) hours as needed for allergies.    [provider]  folic acid (FOLVITE) 1 MG tablet Take 1 tablet (1 mg total) by mouth daily. 01/18/21   Aline August, MD  loperamide (IMODIUM) 2 MG capsule Take 1 capsule (2 mg total) by mouth 2 (two) times daily as needed for diarrhea or loose stools. 10/03/17   Nita Sells, MD  ondansetron (ZOFRAN) 4 MG tablet Take 1 tablet (4 mg total) by mouth every 8 (eight) hours as needed for nausea or vomiting. 01/17/21   Aline August, MD  pantoprazole (PROTONIX) 40 MG tablet Take 1 tablet (40 mg total) by mouth daily. 01/17/21   Aline August, MD  polyethylene glycol (MIRALAX / GLYCOLAX) packet Take 17 g by mouth daily as needed for mild constipation.    [provider]  pramipexole (MIRAPEX) 0.5 MG tablet Take 0.5-1 mg by mouth at  bedtime as needed (restless legs). 09/05/20   [provider]  spironolactone (ALDACTONE) 50 MG tablet Take 100 mg by mouth daily. 09/16/17   [provider]  thiamine 100 MG tablet Take 1 tablet (100 mg total) by mouth daily. 01/18/21   Aline August, MD  traZODone (DESYREL) 100 MG tablet Take 100-200 mg by mouth at bedtime. 08/08/20   [provider]      Allergies    Sulfa antibiotics, Coreg [carvedilol], Motrin [ibuprofen], Toprol xl [metoprolol tartrate], Doxycycline hyclate, Flovent hfa [fluticasone], and Statins    Review of Systems   Review of Systems  All other systems reviewed and are negative.   Physical Exam Updated Vital Signs BP (!) 159/91 (BP Location: Left Arm)   Pulse (!) 110   Temp 97.7 F (36.5 C) (Oral)   Resp 20   SpO2 96%  Physical Exam Vitals and nursing note reviewed.   82 year old female, resting comfortably and in no acute distress. Vital signs are significant for slightly elevated heart rate and elevated blood pressure. Oxygen saturation is  96%, which is normal. Head is normocephalic and atraumatic. PERRLA, EOMI. Oropharynx is clear. Neck is nontender and supple without adenopathy or JVD. Back is nontender and there is no CVA tenderness. Lungs are clear without rales, wheezes, or rhonchi. Chest is nontender. Heart has regular rate and rhythm without murmur. Abdomen is soft,  flat, nontender. Extremities: There is mixed lymphedema and pitting edema on both legs with the pitting component approximately 2+.  Both legs are erythematous and slightly warm to the touch. Skin is warm and dry without rash. Neurologic: Mental status is normal, cranial nerves are intact, moves all extremities equally.  ED Results / Procedures / Treatments   Labs (all labs ordered are listed, but only abnormal results are displayed) Labs Reviewed  COMPREHENSIVE METABOLIC PANEL - Abnormal; Notable for the following components:      Result Value    Glucose, Bld 111 (*)    Calcium 8.5 (*)    All other components within normal limits  CBC WITH DIFFERENTIAL/PLATELET - Abnormal; Notable for the following components:   RDW 19.5 (*)    All other components within normal limits  BRAIN NATRIURETIC PEPTIDE  SEDIMENTATION RATE   Radiology DG Chest Port 1 View  Result Date: 04/06/2022 CLINICAL DATA:  Bilateral lower extremity pain and edema. EXAM: PORTABLE CHEST 1 VIEW COMPARISON:  01/13/2021. FINDINGS: The heart size and mediastinal contours are within normal limits. Minimal atelectasis is present at the left lung base. No effusion or pneumothorax. No acute osseous abnormality. IMPRESSION: Minimal atelectasis at the left lung base. Electronically Signed   By: Brett Fairy M.D.   On: 04/06/2022 00:28    Procedures Procedures    Medications Ordered in ED Medications - No data to display  ED Course/ Medical Decision Making/ A&P                             Medical Decision Making Amount and/or Complexity of Data Reviewed Labs: ordered. Radiology: ordered.  Risk OTC drugs. Prescription drug management.   Combined pitting and lymphedema with what appears to be low-grade cellulitis.  Patient is nontoxic in appearance.  I expect that she would benefit from diuretics and antibiotics.  I have ordered screening labs of CBC, comprehensive metabolic panel, BNP, chest x-ray, sedimentation rate.  Chest x-ray shows no evidence of heart failure.  I have independently viewed the image, and agree with radiologist interpretation.  I have reviewed and interpreted her laboratory test, and my interpretation is mildly elevated random glucose which will need to be followed as an outpatient, normal albumin level, normal CBC including normal differential, normal sedimentation rate, normal brain natruretic protein.  I feel she will need short-term diuresis for symptomatic relief and I feel she needs antibiotics to treat her low-grade cellulitis.  She has  requested medication for pain and I have given her dose of acetaminophen with good relief.  I am discharging her June her with prescription for furosemide for 5 days, cephalexin with initial dose ordered to be given in the emergency department.  She will need to be reevaluated for response to treatment and to see if she needs a longer course of diuretics.  I have given her strict return precautions should she develop fever or worsening symptoms.  Final Clinical Impression(s) / ED Diagnoses Final diagnoses:  Cellulitis of lower extremity, unspecified laterality  Peripheral edema  Chronic anticoagulation  Elevated random blood glucose level    Rx / DC Orders ED Discharge Orders          Ordered    cephALEXin (KEFLEX) 500 MG capsule  3 times daily        04/06/22 0241    furosemide (LASIX) 40 MG tablet  Daily        04/06/22 0241  Delora Fuel, MD 49/82/64 [phone]

## 2022-04-06 ENCOUNTER — Emergency Department (HOSPITAL_COMMUNITY): Payer: Medicare Other

## 2022-04-06 DIAGNOSIS — R079 Chest pain, unspecified: Secondary | ICD-10-CM | POA: Diagnosis not present

## 2022-04-06 DIAGNOSIS — J9811 Atelectasis: Secondary | ICD-10-CM | POA: Diagnosis not present

## 2022-04-06 LAB — CBC WITH DIFFERENTIAL/PLATELET
Abs Immature Granulocytes: 0.02 10*3/uL (ref 0.00–0.07)
Basophils Absolute: 0.1 10*3/uL (ref 0.0–0.1)
Basophils Relative: 1 %
Eosinophils Absolute: 0.2 10*3/uL (ref 0.0–0.5)
Eosinophils Relative: 2 %
HCT: 42.3 % (ref 36.0–46.0)
Hemoglobin: 13.6 g/dL (ref 12.0–15.0)
Immature Granulocytes: 0 %
Lymphocytes Relative: 31 %
Lymphs Abs: 2.1 10*3/uL (ref 0.7–4.0)
MCH: 27.5 pg (ref 26.0–34.0)
MCHC: 32.2 g/dL (ref 30.0–36.0)
MCV: 85.5 fL (ref 80.0–100.0)
Monocytes Absolute: 0.7 10*3/uL (ref 0.1–1.0)
Monocytes Relative: 10 %
Neutro Abs: 3.8 10*3/uL (ref 1.7–7.7)
Neutrophils Relative %: 56 %
Platelets: 190 10*3/uL (ref 150–400)
RBC: 4.95 MIL/uL (ref 3.87–5.11)
RDW: 19.5 % — ABNORMAL HIGH (ref 11.5–15.5)
WBC: 6.9 10*3/uL (ref 4.0–10.5)
nRBC: 0 % (ref 0.0–0.2)

## 2022-04-06 LAB — COMPREHENSIVE METABOLIC PANEL
ALT: 18 U/L (ref 0–44)
AST: 29 U/L (ref 15–41)
Albumin: 3.6 g/dL (ref 3.5–5.0)
Alkaline Phosphatase: 93 U/L (ref 38–126)
Anion gap: 8 (ref 5–15)
BUN: 12 mg/dL (ref 8–23)
CO2: 27 mmol/L (ref 22–32)
Calcium: 8.5 mg/dL — ABNORMAL LOW (ref 8.9–10.3)
Chloride: 103 mmol/L (ref 98–111)
Creatinine, Ser: 0.71 mg/dL (ref 0.44–1.00)
GFR, Estimated: >60 mL/min (ref >60)
Glucose, Bld: 111 mg/dL — ABNORMAL HIGH (ref 70–99)
Potassium: 3.7 mmol/L (ref 3.5–5.1)
Sodium: 138 mmol/L (ref 135–145)
Total Bilirubin: 0.4 mg/dL (ref 0.3–1.2)
Total Protein: 6.9 g/dL (ref 6.5–8.1)

## 2022-04-06 LAB — BRAIN NATRIURETIC PEPTIDE: B Natriuretic Peptide: 29.8 pg/mL (ref 0.0–100.0)

## 2022-04-06 LAB — SEDIMENTATION RATE: Sed Rate: 21 mm/hr (ref 0–22)

## 2022-04-06 MED ORDER — ACETAMINOPHEN 325 MG PO TABS
650.0000 mg | ORAL_TABLET | Freq: Once | ORAL | Status: AC
  Administered 2022-04-06: 650 mg via ORAL
  Filled 2022-04-06: qty 2

## 2022-04-06 MED ORDER — FUROSEMIDE 40 MG PO TABS: 40.0000 mg | ORAL_TABLET | Freq: Every day | ORAL | 0 refills | Status: DC

## 2022-04-06 MED ORDER — CEPHALEXIN 500 MG PO CAPS: 500.0000 mg | ORAL_CAPSULE | Freq: Three times a day (TID) | ORAL | 0 refills | Status: DC

## 2022-04-06 MED ORDER — CEPHALEXIN 500 MG PO CAPS
500.0000 mg | ORAL_CAPSULE | Freq: Once | ORAL | Status: AC
  Administered 2022-04-06: 500 mg via ORAL
  Filled 2022-04-06: qty 1

## 2022-04-06 MED ORDER — OXYCODONE-ACETAMINOPHEN 5-325 MG PO TABS
1.0000 | ORAL_TABLET | Freq: Once | ORAL | Status: AC
  Administered 2022-04-06: 1 via ORAL
  Filled 2022-04-06: qty 1

## 2022-04-06 NOTE — ED Notes (Signed)
Assisted RN in changing pt's wet brief and slid pt up in the bed

## 2022-04-06 NOTE — Discharge Instructions (Signed)
Try to keep your legs elevated is much as possible.  You may take acetaminophen as needed for pain.  Return to the emergency department if you start running a fever, or if your pain and swelling are getting worse.

## 2022-04-06 NOTE — Progress Notes (Addendum)
Transition of Care [facility]) - Emergency Department Mini Assessment   Patient Details  Name: Denise Kelly MRN: [id number] Date of Birth: April 25, 1940  Transition of Care [facility]) CM/SW Contact:    Kimber Relic, LCSW Phone Number: 04/06/2022, 11:19 AM   Clinical Narrative: TOC consulted due to staff being unable to reach the person who is supposed to pick the pt up and return her home. This CSW spoke with RN who informed that the pt appears to be confused and is stating she forgot David's (friends son) number. When asked if she has access to get into her home, she reports "probably" and it is suspected she may have a lock box at her door. This CSW attempted to contact Hope to inquire about David's whereabouts and contact information. Hope reported to RN that Shanon Brow has to come get her because she doesn't drive.   This CSW noted no additional numbers in the pt's chart and will continue to try to reach [facility] to locate Humphreys. Pt is not appropriate to d/c via Taxi or Safe Transport at this time if confused. It is also not guaranteed that the pt has access to her home.   Addend @ 11:37 AM This CSW attempted to contact [facility] [facility] again without success. Left HIPAA Compliant voicemail.    ED Mini Assessment: What brought you to the Emergency Department? : pain in BLE.  Barriers to Discharge: ED Transportation  Barrier interventions: Staff will continue to attempt to call the pt's friends' son, Shanon Brow.          Patient Contact and Communications Key Contact 1: Hope (pt's friend)     Contact Date: 04/06/22,   Contact time: 1115             Admission diagnosis:  pain and swelling in both legs Patient Active Problem List   Diagnosis Date Noted   UTI (urinary tract infection) 28/78/6767   Acute metabolic encephalopathy 20/94/7096   Upper GI bleed 01/23/2018   Leukocytosis 01/23/2018   Physical deconditioning 01/23/2018   Acute pulmonary embolus ([place]) 01/04/2018   Acute encephalopathy  09/28/2017   Constipation, slow transit 09/28/2017   Unsteady gait 04/28/2017   Spondylolisthesis 07/17/2016   Bilateral lower extremity edema 07/17/2016   Chronic pain syndrome    Depression 05/23/2016   Chronic back pain 02/25/2016   Lesion of liver 02/25/2016   History of fracture of left hip 11/28/2015   Fall 11/28/2015   RLS (restless legs syndrome) 11/28/2015   Confusion 03/14/2014   Recurrent UTI 03/14/2014   Effusion of right knee 02/22/2014   Alcohol dependence ([place]) 02/22/2014   Substance induced mood disorder ([place]) 02/22/2014   Hepatic encephalopathy ([facility])    Altered mental status 02/21/2014   SIRS (systemic inflammatory response syndrome) ([place]) 02/21/2014   S/P total knee arthroplasty 01/02/2014   Essential hypertension, benign 11/10/2013   LAFB (left anterior fascicular block) 11/10/2013   Obesity, unspecified 11/10/2013   PCP:  [name], MD Pharmacy:   [id number] [address] [id number]: [phone]: [phone]

## 2022-04-06 NOTE — Progress Notes (Signed)
Hope called this CSW back and states she believes her son is at work now. She states she and her daughter will come to pick the pt up between 1:30 and 2PM. RN notified via secure chat.

## 2022-04-06 NOTE — ED Notes (Signed)
We have attempted to contact pt's ride throughout the morning. Denise Kelly has not answered texts, his mother has tried to contact him as well without success. Mother does not drive.

## 2022-04-06 NOTE — ED Notes (Signed)
Pt's ride has still not answered. She has also contacted the ride's mother to try and get a ride.

## 2022-04-06 NOTE — ED Notes (Signed)
RN went to check on pt, she pulled her IV out, took her shirt off, pulled the purwick out, we cleaned the pt with soap and water and changed brief pt seemed confused and doesn't know why after she was asked

## 2022-04-06 NOTE — ED Notes (Signed)
Placed a pillow under pt's left knee pt complaining of pain RN gave tylenol

## 2022-04-10 ENCOUNTER — Telehealth: Payer: Self-pay | Admitting: *Deleted

## 2022-04-10 NOTE — Telephone Encounter (Signed)
        Patient  visited [facility] on 04/06/2022  for Cellulitus    Telephone encounter attempt :  1st  A HIPAA compliant voice message was left requesting a return call.  Instructed patient to call back at [phone].  [place] [phone] [address] , [place] [address] Email : [email or web address]

## 2022-04-11 ENCOUNTER — Telehealth: Payer: Self-pay | Admitting: *Deleted

## 2022-04-11 ENCOUNTER — Telehealth: Payer: Self-pay

## 2022-04-11 NOTE — Telephone Encounter (Signed)
        Patient  visited [facility] on 04/06/2022  for Cellulitus   Telephone encounter attempt :  2nd  A HIPAA compliant voice message was left requesting a return call.  Instructed patient to call back at [phone].  [facility] [phone] [address] , [place] [address] Email : [email or web address]

## 2022-04-11 NOTE — Telephone Encounter (Signed)
        Patient  visited [facility] on 1/21     Telephone encounter attempt :  1st  A HIPAA compliant voice message was left requesting a return call.  Instructed patient to call back    [place], [facility]  [phone] [address], [place], [place] [address] Phone: [phone] Email: [email or web address]

## 2022-04-15 ENCOUNTER — Telehealth: Payer: Self-pay

## 2022-04-15 NOTE — Telephone Encounter (Signed)
        Patient  visited [facility] on 1/21    Telephone encounter attempt :  2nd  A HIPAA compliant voice message was left requesting a return call.  Instructed patient to call back.    [facility], [facility]  [phone] [address], [place], [place] [address] Phone: [phone] Email: [email or web address]

## 2022-04-20 ENCOUNTER — Encounter (HOSPITAL_COMMUNITY): Payer: Self-pay | Admitting: Osteopathic Medicine

## 2022-04-20 ENCOUNTER — Observation Stay (HOSPITAL_COMMUNITY)
Admission: EM | Admit: 2022-04-20 | Discharge: 2022-04-23 | Disposition: A | Discharge status: Home Health | Payer: Medicare Other | Attending: Internal Medicine | Admitting: Internal Medicine

## 2022-04-20 ENCOUNTER — Emergency Department (HOSPITAL_BASED_OUTPATIENT_CLINIC_OR_DEPARTMENT_OTHER): Payer: Medicare Other

## 2022-04-20 ENCOUNTER — Other Ambulatory Visit: Payer: Self-pay

## 2022-04-20 DIAGNOSIS — E876 Hypokalemia: Secondary | ICD-10-CM | POA: Insufficient documentation

## 2022-04-20 DIAGNOSIS — L03115 Cellulitis of right lower limb: Secondary | ICD-10-CM | POA: Diagnosis not present

## 2022-04-20 DIAGNOSIS — Z9851 Tubal ligation status: Secondary | ICD-10-CM

## 2022-04-20 DIAGNOSIS — I10 Essential (primary) hypertension: Secondary | ICD-10-CM | POA: Insufficient documentation

## 2022-04-20 DIAGNOSIS — F3289 Other specified depressive episodes: Secondary | ICD-10-CM | POA: Diagnosis not present

## 2022-04-20 DIAGNOSIS — Z86718 Personal history of other venous thrombosis and embolism: Secondary | ICD-10-CM

## 2022-04-20 DIAGNOSIS — Z881 Allergy status to other antibiotic agents status: Secondary | ICD-10-CM

## 2022-04-20 DIAGNOSIS — I890 Lymphedema, not elsewhere classified: Secondary | ICD-10-CM

## 2022-04-20 DIAGNOSIS — I872 Venous insufficiency (chronic) (peripheral): Secondary | ICD-10-CM | POA: Diagnosis present

## 2022-04-20 DIAGNOSIS — R5381 Other malaise: Secondary | ICD-10-CM | POA: Insufficient documentation

## 2022-04-20 DIAGNOSIS — Z886 Allergy status to analgesic agent status: Secondary | ICD-10-CM

## 2022-04-20 DIAGNOSIS — M7989 Other specified soft tissue disorders: Secondary | ICD-10-CM

## 2022-04-20 DIAGNOSIS — Z91148 Patient's other noncompliance with medication regimen for other reason: Secondary | ICD-10-CM

## 2022-04-20 DIAGNOSIS — L0390 Cellulitis, unspecified: Secondary | ICD-10-CM | POA: Diagnosis present

## 2022-04-20 DIAGNOSIS — Z85828 Personal history of other malignant neoplasm of skin: Secondary | ICD-10-CM

## 2022-04-20 DIAGNOSIS — M1990 Unspecified osteoarthritis, unspecified site: Secondary | ICD-10-CM | POA: Diagnosis present

## 2022-04-20 DIAGNOSIS — R52 Pain, unspecified: Secondary | ICD-10-CM | POA: Diagnosis not present

## 2022-04-20 DIAGNOSIS — F32A Depression, unspecified: Secondary | ICD-10-CM | POA: Diagnosis present

## 2022-04-20 DIAGNOSIS — G894 Chronic pain syndrome: Secondary | ICD-10-CM | POA: Diagnosis not present

## 2022-04-20 DIAGNOSIS — Z7901 Long term (current) use of anticoagulants: Secondary | ICD-10-CM

## 2022-04-20 DIAGNOSIS — L03116 Cellulitis of left lower limb: Principal | ICD-10-CM | POA: Insufficient documentation

## 2022-04-20 DIAGNOSIS — Z91199 Patient's noncompliance with other medical treatment and regimen due to unspecified reason: Secondary | ICD-10-CM | POA: Insufficient documentation

## 2022-04-20 DIAGNOSIS — F1020 Alcohol dependence, uncomplicated: Secondary | ICD-10-CM | POA: Diagnosis present

## 2022-04-20 DIAGNOSIS — R609 Edema, unspecified: Secondary | ICD-10-CM

## 2022-04-20 DIAGNOSIS — Z96653 Presence of artificial knee joint, bilateral: Secondary | ICD-10-CM | POA: Diagnosis present

## 2022-04-20 DIAGNOSIS — Z981 Arthrodesis status: Secondary | ICD-10-CM

## 2022-04-20 DIAGNOSIS — Z789 Other specified health status: Secondary | ICD-10-CM

## 2022-04-20 DIAGNOSIS — Z634 Disappearance and death of family member: Secondary | ICD-10-CM

## 2022-04-20 DIAGNOSIS — L03119 Cellulitis of unspecified part of limb: Secondary | ICD-10-CM | POA: Diagnosis not present

## 2022-04-20 DIAGNOSIS — Z882 Allergy status to sulfonamides status: Secondary | ICD-10-CM

## 2022-04-20 DIAGNOSIS — Z888 Allergy status to other drugs, medicaments and biological substances status: Secondary | ICD-10-CM

## 2022-04-20 DIAGNOSIS — Z79899 Other long term (current) drug therapy: Secondary | ICD-10-CM | POA: Diagnosis not present

## 2022-04-20 DIAGNOSIS — K219 Gastro-esophageal reflux disease without esophagitis: Secondary | ICD-10-CM | POA: Insufficient documentation

## 2022-04-20 DIAGNOSIS — Z86711 Personal history of pulmonary embolism: Secondary | ICD-10-CM | POA: Diagnosis not present

## 2022-04-20 DIAGNOSIS — R600 Localized edema: Secondary | ICD-10-CM | POA: Diagnosis not present

## 2022-04-20 LAB — CBC WITH DIFFERENTIAL/PLATELET
Abs Immature Granulocytes: 0.01 10*3/uL (ref 0.00–0.07)
Basophils Absolute: 0.1 10*3/uL (ref 0.0–0.1)
Basophils Relative: 1 %
Eosinophils Absolute: 0.1 10*3/uL (ref 0.0–0.5)
Eosinophils Relative: 1 %
HCT: 41.7 % (ref 36.0–46.0)
Hemoglobin: 13.4 g/dL (ref 12.0–15.0)
Immature Granulocytes: 0 %
Lymphocytes Relative: 36 %
Lymphs Abs: 2.1 10*3/uL (ref 0.7–4.0)
MCH: 28.2 pg (ref 26.0–34.0)
MCHC: 32.1 g/dL (ref 30.0–36.0)
MCV: 87.8 fL (ref 80.0–100.0)
Monocytes Absolute: 0.5 10*3/uL (ref 0.1–1.0)
Monocytes Relative: 9 %
Neutro Abs: 3 10*3/uL (ref 1.7–7.7)
Neutrophils Relative %: 53 %
Platelets: 196 10*3/uL (ref 150–400)
RBC: 4.75 MIL/uL (ref 3.87–5.11)
RDW: 20.4 % — ABNORMAL HIGH (ref 11.5–15.5)
WBC: 5.8 10*3/uL (ref 4.0–10.5)
nRBC: 0 % (ref 0.0–0.2)

## 2022-04-20 LAB — BASIC METABOLIC PANEL
Anion gap: 7 (ref 5–15)
BUN: 11 mg/dL (ref 8–23)
CO2: 29 mmol/L (ref 22–32)
Calcium: 8.1 mg/dL — ABNORMAL LOW (ref 8.9–10.3)
Chloride: 104 mmol/L (ref 98–111)
Creatinine, Ser: 0.78 mg/dL (ref 0.44–1.00)
GFR, Estimated: >60 mL/min (ref >60)
Glucose, Bld: 101 mg/dL — ABNORMAL HIGH (ref 70–99)
Potassium: 4.3 mmol/L (ref 3.5–5.1)
Sodium: 140 mmol/L (ref 135–145)

## 2022-04-20 MED ORDER — SODIUM CHLORIDE 0.9 % IV SOLN
1.0000 g | Freq: Once | INTRAVENOUS | Status: AC
  Administered 2022-04-20: 1 g via INTRAVENOUS
  Filled 2022-04-20: qty 10

## 2022-04-20 MED ORDER — ENOXAPARIN SODIUM 40 MG/0.4ML IJ SOSY
40.0000 mg | PREFILLED_SYRINGE | INTRAMUSCULAR | Status: DC
  Administered 2022-04-21 – 2022-04-23 (×3): 40 mg via SUBCUTANEOUS
  Filled 2022-04-20 (×3): qty 0.4

## 2022-04-20 MED ORDER — ADULT MULTIVITAMIN W/MINERALS CH
1.0000 | ORAL_TABLET | Freq: Every day | ORAL | Status: DC
  Administered 2022-04-21 – 2022-04-23 (×3): 1 via ORAL
  Filled 2022-04-20 (×3): qty 1

## 2022-04-20 MED ORDER — FUROSEMIDE 10 MG/ML IJ SOLN
40.0000 mg | Freq: Once | INTRAMUSCULAR | Status: AC
  Administered 2022-04-21: 40 mg via INTRAVENOUS
  Filled 2022-04-20: qty 4

## 2022-04-20 MED ORDER — SODIUM CHLORIDE 0.9 % IV SOLN
2.0000 g | INTRAVENOUS | Status: DC
  Administered 2022-04-21 – 2022-04-23 (×3): 2 g via INTRAVENOUS
  Filled 2022-04-20 (×3): qty 20

## 2022-04-20 MED ORDER — THIAMINE MONONITRATE 100 MG PO TABS
100.0000 mg | ORAL_TABLET | Freq: Every day | ORAL | Status: DC
  Administered 2022-04-21 – 2022-04-22 (×2): 100 mg via ORAL
  Filled 2022-04-20 (×3): qty 1

## 2022-04-20 MED ORDER — OXYCODONE HCL 5 MG PO TABS
5.0000 mg | ORAL_TABLET | ORAL | Status: DC | PRN
  Administered 2022-04-20 – 2022-04-23 (×14): 5 mg via ORAL
  Filled 2022-04-20 (×14): qty 1

## 2022-04-20 MED ORDER — LORAZEPAM 1 MG PO TABS: 1.0000 mg | ORAL_TABLET | ORAL | Status: DC | PRN

## 2022-04-20 MED ORDER — ACETAMINOPHEN 325 MG PO TABS
650.0000 mg | ORAL_TABLET | Freq: Four times a day (QID) | ORAL | Status: DC | PRN
  Administered 2022-04-22: 650 mg via ORAL
  Filled 2022-04-20 (×2): qty 2

## 2022-04-20 MED ORDER — FUROSEMIDE 10 MG/ML IJ SOLN
20.0000 mg | Freq: Once | INTRAMUSCULAR | Status: AC
  Administered 2022-04-20: 20 mg via INTRAVENOUS
  Filled 2022-04-20: qty 4

## 2022-04-20 MED ORDER — ACETAMINOPHEN 650 MG RE SUPP: 650.0000 mg | Freq: Four times a day (QID) | RECTAL | Status: DC | PRN

## 2022-04-20 MED ORDER — THIAMINE HCL 100 MG/ML IJ SOLN
100.0000 mg | Freq: Every day | INTRAMUSCULAR | Status: DC
  Administered 2022-04-23: 100 mg via INTRAVENOUS
  Filled 2022-04-20 (×2): qty 2

## 2022-04-20 MED ORDER — FOLIC ACID 1 MG PO TABS
1.0000 mg | ORAL_TABLET | Freq: Every day | ORAL | Status: DC
  Administered 2022-04-21 – 2022-04-23 (×3): 1 mg via ORAL
  Filled 2022-04-20 (×3): qty 1

## 2022-04-20 NOTE — Progress Notes (Signed)
Lower extremity venous bilateral study completed.  Preliminary results relayed to Gilford Raid, MD.   See CV Proc for preliminary results report.   Darlin Coco, RDMS, RVT

## 2022-04-20 NOTE — ED Triage Notes (Signed)
Pt BIBA from home. Pt c/o bilateral leg swelling d/t lymphedema. Both lower legs are swollen and red. Not warm to the touch.  Visit on 04/05/22 for same complaint  Aox4  BP: 138/100 HR: 90 Spo2: 97 RA CBG: 135

## 2022-04-20 NOTE — H&P (Signed)
HISTORY AND PHYSICAL    Denise Kelly   YCX:[id number] DOB: [birth year]   Date of Service: 04/20/22 Requesting physician/APP from ED: Treatment Team:  Attending Provider: Emeterio Reeve, DO  PCP: [name], MD     HPI: Denise Kelly is a 82 y.o. female coming from home to the ED due to chief complaint lower extremity swelling/pain.  Patient is a very poor historian, hard of hearing.  She was recently in the ED for similar complaints, treated with antibiotics and Lasix, has not had a PCP to follow-up with.  Return to ED for lower extremity edema.  She states she is "not sure of the timing" with regard to how soon after discontinuation of Lasix/ABX the swelling reoccurred.  She reports significant tenderness to the skin and some weeping of the skin.  No fever/chills.  She has a history of lower extremity edema, states "I used to get these wrapped."  Previously following with vascular surgery as well.  History of DVT/PE in 2018, unprovoked, only history of DVT/PE.  Was on Eliquis for at least 6 months, discharge summary at that point notes potential lifetime if benefit given sedentary lifestyle.  Patient states no other medical issues.  ED, treated with ceftriaxone, furosemide IV 20 mg, fentanyl.  Hospitalist service was asked to evaluate for admission for presumed cellulitis.  No significant concerns on vital signs or labs.    Consultants:  none  Procedures: none      ASSESSMENT & PLAN:   Principal Problem:   Cellulitis Active Problems:   Alcohol dependence (HCC)   Depression   Chronic pain syndrome   Lymphedema   History of deep vein thrombosis (DVT) of lower extremity   History of pulmonary embolus (PE)  Cellulitis Lymphedema Not convinced of cellulitis but skin is significantly erythematous/tender, seems more likely irritation related to venous insufficiency given bilateral distribution.  DVT US was negative.  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 TOC consult placed, needs PCP to maintain medications and prevent readmission  Alcohol dependence ([place]) Denies recent use CIWA  Depression Chronic pain syndrome Pain control now Needs outpatient f/u No need for psychiatric eval at this time   History of deep vein thrombosis (DVT) of lower extremity History of pulmonary embolus (PE) History PE and DVT 12/2017 Per d/c summary then "Continue eliquis (pt has 6 more days of 10 mg BID and then will transition to 5 mg BID). She should be on eliquis at least 6 months and likely indefinitely if sedentary lifestyle does not change and benefit continues to outweigh risks." Strongly consider Eliquis at discharge .   DVT prophylaxis: Lovenox Pertinent IV fluids/nutrition: none Central lines / invasive devices: none  Code Status: FULL CODE Family Communication: none  Disposition: inpatient  TOC needs: needs PCP, likely needs HH Barriers to discharge / significant pending items: IV lasix and monitor BMP, PT/OT eval but suspect will be able to d/c home tomorrow on po meds and outpatient followup  Review of Systems:  Review of Systems  Constitutional:  Negative for chills, fever, malaise/fatigue and weight loss.  Respiratory:  Negative for cough, hemoptysis and shortness of breath.   Cardiovascular:  Positive for leg swelling. Negative for chest pain, palpitations,  orthopnea and claudication.  Gastrointestinal:  Negative for abdominal pain, heartburn, nausea and vomiting.  Genitourinary:  Negative for dysuria.  Musculoskeletal:  Positive for joint pain and myalgias. Negative for falls.  Skin:  Positive for rash.  Neurological:  Negative for dizziness and sensory change.  Psychiatric/Behavioral:  Positive for depression. Negative for hallucinations, substance abuse and suicidal ideas.        has a past medical history of Alcohol abuse, Anxiety, Arthritis, Cancer ([place]), Cellulitis, Complication of anesthesia (2009), Depression, Encephalopathy, GERD (gastroesophageal reflux disease), HOH (hard of hearing), HOH (hard of hearing), Hypertension, Palpitations, PONV (postoperative nausea and vomiting), Spondylolisthesis of lumbosacral region, and UTI (urinary tract infection).  NO home medications   Allergies  Allergen Reactions   Sulfa Antibiotics Hives, Swelling and Other (See Comments)    Reaction:  Facial/eye swelling    Coreg [Carvedilol] Other (See Comments)    Memory loss   Motrin [Ibuprofen] Palpitations   Toprol Xl [Metoprolol Tartrate] Cough   Doxycycline Hyclate Other (See Comments)    HEARTBURN   Flovent Hfa [Fluticasone] Itching   Statins Other (See Comments)    MENTAL STATUS CHANGE      family history includes Cancer in her father and mother; Hypertension in an other family member. 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   COLONOSCOPY  2011   polyps   ESOPHAGOGASTRODUODENOSCOPY (EGD) WITH PROPOFOL N/A 01/24/2018   Procedure: ESOPHAGOGASTRODUODENOSCOPY (EGD) WITH PROPOFOL;  Surgeon: Ronnette Juniper, MD;  Location: [place];  Service: Gastroenterology;  Laterality: N/A;   EYE SURGERY Bilateral     cataracts   INTRAMEDULLARY (IM) NAIL INTERTROCHANTERIC Left 11/29/2015   Procedure: LEFT HIP   NAIL;  Surgeon: Renette Butters, MD;  Location: [place];  Service: Orthopedics;  Laterality: Left;   JOINT REPLACEMENT Left 09/2009   knee   LUMBAR FUSION  07/17/2016   Lumbar four-five Posterior lumbar interbody fusion (N/A)   TONSILLECTOMY     TOTAL KNEE ARTHROPLASTY Right 01/02/2014   Procedure: TOTAL KNEE ARTHROPLASTY;  Surgeon: [name], MD;  Location: [place];  Service: Orthopedics;  Laterality: Right;   TUBAL LIGATION            Objective Findings:  Vitals:   04/20/22 1356 04/20/22 1359 04/20/22 1401 04/20/22 1700  BP:   120/74 128/82  Pulse:   90 93  Resp:   18 13  Temp:   97.6 F (36.4 C)   TempSrc:   Oral   SpO2: 97% 97% 97% 96%   No intake or output data in the 24 hours ending 04/20/22 1845 There were no vitals filed for this visit.  Examination:  Physical Exam Constitutional:      General: She is not in acute distress.    Appearance: She is obese.  Cardiovascular:     Rate and Rhythm: Normal rate and regular rhythm.  Pulmonary:     Effort: Pulmonary effort is normal.     Breath sounds: Normal breath sounds. No rales.  Abdominal:  General: Abdomen is flat. Bowel sounds are normal.     Palpations: Abdomen is soft.  Musculoskeletal:     Right lower leg: Edema present.     Left lower leg: Edema present.  Skin:    General: Skin is warm.     Findings: Erythema present.  Neurological:     General: No focal deficit present.     Mental Status: She is alert and oriented to person, place, and time.  Psychiatric:        Mood and Affect: Mood normal.          Scheduled Medications:    Continuous Infusions:   PRN Medications:    Antimicrobials:  Anti-infectives (From admission, onward)    Start     Dose/Rate Route Frequency Ordered Stop   04/21/22 1500  cefTRIAXone (ROCEPHIN) 2 g in sodium chloride 0.9 % 100 mL IVPB        2 g 200 mL/hr over 30  Minutes Intravenous Every 24 hours 04/20/22 1800 04/28/22 1459   04/20/22 1430  cefTRIAXone (ROCEPHIN) 1 g in sodium chloride 0.9 % 100 mL IVPB        1 g 200 mL/hr over 30 Minutes Intravenous  Once 04/20/22 1424 04/20/22 1624           Data Reviewed: I have personally reviewed following labs and imaging studies  CBC: Recent Labs  Lab 04/20/22 1302  WBC 5.8  NEUTROABS 3.0  HGB 13.4  HCT 41.7  MCV 87.8  PLT 893   Basic Metabolic Panel: Recent Labs  Lab 04/20/22 1302  NA 140  K 4.3  CL 104  CO2 29  GLUCOSE 101*  BUN 11  CREATININE 0.78  CALCIUM 8.1*   GFR: CrCl cannot be calculated (Unknown ideal weight.). Liver Function Tests: No results for input(s): "AST", "ALT", "ALKPHOS", "BILITOT", "PROT", "ALBUMIN" in the last 168 hours. No results for input(s): "LIPASE", "AMYLASE" in the last 168 hours. No results for input(s): "AMMONIA" in the last 168 hours. Coagulation Profile: No results for input(s): "INR", "PROTIME" in the last 168 hours. Cardiac Enzymes: No results for input(s): "CKTOTAL", "CKMB", "CKMBINDEX", "TROPONINI" in the last 168 hours. BNP (last 3 results) No results for input(s): "PROBNP" in the last 8760 hours. HbA1C: No results for input(s): "HGBA1C" in the last 72 hours. CBG: No results for input(s): "GLUCAP" in the last 168 hours. Lipid Profile: No results for input(s): "CHOL", "HDL", "LDLCALC", "TRIG", "CHOLHDL", "LDLDIRECT" in the last 72 hours. Thyroid Function Tests: No results for input(s): "TSH", "T4TOTAL", "FREET4", "T3FREE", "THYROIDAB" in the last 72 hours. Anemia Panel: No results for input(s): "VITAMINB12", "FOLATE", "FERRITIN", "TIBC", "IRON", "RETICCTPCT" in the last 72 hours. Most Recent Urinalysis On File:     Component Value Date/Time   COLORURINE AMBER (A) 01/13/2021 1504   APPEARANCEUR CLEAR 01/13/2021 1504   APPEARANCEUR Clear 09/19/2016 1633   LABSPEC 1.008 01/13/2021 1504   PHURINE 5.0 01/13/2021 1504   GLUCOSEU  NEGATIVE 01/13/2021 1504   HGBUR SMALL (A) 01/13/2021 1504   BILIRUBINUR NEGATIVE 01/13/2021 1504   BILIRUBINUR Negative 09/19/2016 [place] 01/13/2021 1504   PROTEINUR NEGATIVE 01/13/2021 1504   UROBILINOGEN 1.0 11/30/2014 2105   NITRITE POSITIVE (A) 01/13/2021 1504   LEUKOCYTESUR NEGATIVE 01/13/2021 1504   Sepsis Labs: '@LABRCNTIP'$ (procalcitonin:4,lacticidven:4)  No results found for this or any previous visit (from the past 240 hour(s)).       Radiology Studies: VAS [place] LOWER EXTREMITY VENOUS (DVT) (ONLY [facility] & [facility])  Result Date:  04/20/2022  Lower Venous DVT Study Patient Name:  SHIRLEY DECAMP  Date of Exam:   04/20/2022 Medical Rec #: [id number]        Accession #:    [id number] Date of Birth: 29-May-1940        Patient Gender: F Patient Age:   89 years Exam Location:  [facility] Procedure:      VAS [place] LOWER EXTREMITY VENOUS (DVT) Referring Phys: JULIE HAVILAND --------------------------------------------------------------------------------  Indications: Pain, swelling, redness in the setting of cellulitis and lymphedema. Other Indications: History of DVT/PE. Patient previously on Eliquis. Limitations: Patient intolerant to moderate probe pressure, highly intolerant to compressions, constant patient movement, tissue properties. Comparison Study: 01-14-2021 Prior bilateral lower extremity venous study was                   limited due to patient intolerance to compression, but                   negative for DVT. Performing Technologist: Darlin Coco RDMS, RVT  Examination Guidelines: A complete evaluation includes B-mode imaging, spectral Doppler, color Doppler, and power Doppler as needed of all accessible portions of each vessel. Bilateral testing is considered an integral part of a complete examination. Limited examinations for reoccurring indications may be performed as noted. The reflux portion of the exam is performed with the patient in reverse Trendelenburg.   +---------+---------------+---------+-----------+----------+-------------------+ RIGHT    CompressibilityPhasicitySpontaneityPropertiesThrombus Aging      +---------+---------------+---------+-----------+----------+-------------------+ CFV      Full           Yes      Yes                                      +---------+---------------+---------+-----------+----------+-------------------+ SFJ      Full                                                             +---------+---------------+---------+-----------+----------+-------------------+ FV Prox  Full                                                             +---------+---------------+---------+-----------+----------+-------------------+ FV Mid   Full                                                             +---------+---------------+---------+-----------+----------+-------------------+ FV DistalFull                                                             +---------+---------------+---------+-----------+----------+-------------------+ PFV      Full                                                             +---------+---------------+---------+-----------+----------+-------------------+  POP                     Yes      Yes                  Unable to tolerate                                                        compression         +---------+---------------+---------+-----------+----------+-------------------+ PTV                     Yes      Yes                  Patent by color-                                                          unable to tolerate                                                        compression         +---------+---------------+---------+-----------+----------+-------------------+ PERO                    Yes      Yes                  Patent by color-                                                          unable to tolerate                                                         compression         +---------+---------------+---------+-----------+----------+-------------------+  +---------+---------------+---------+-----------+----------+-------------------+ LEFT     CompressibilityPhasicitySpontaneityPropertiesThrombus Aging      +---------+---------------+---------+-----------+----------+-------------------+ CFV      Full           Yes      Yes                                      +---------+---------------+---------+-----------+----------+-------------------+ SFJ      Full                                                             +---------+---------------+---------+-----------+----------+-------------------+  FV Prox  Full                                                             +---------+---------------+---------+-----------+----------+-------------------+ FV Mid   Full                                                             +---------+---------------+---------+-----------+----------+-------------------+ FV Distal               Yes      Yes                  Patent by color-                                                          unable to tolerate                                                        compression         +---------+---------------+---------+-----------+----------+-------------------+ PFV      Full                                                             +---------+---------------+---------+-----------+----------+-------------------+ POP                     Yes      Yes                                      +---------+---------------+---------+-----------+----------+-------------------+ PTV                                                   Patent by color-                                                          unable to tolerate  compression          +---------+---------------+---------+-----------+----------+-------------------+ PERO                                                  Not visualized-                                                           unable to contact                                                         skin with probe                                                           without patient                                                           movement/pain       +---------+---------------+---------+-----------+----------+-------------------+    Summary: RIGHT: - There is no evidence of deep vein thrombosis in the lower extremity. However, portions of this examination were limited- see technologist comments above.  - No cystic structure found in the popliteal fossa.  LEFT: - There is no evidence of deep vein thrombosis in the lower extremity. However, portions of this examination were limited- see technologist comments above.  - No cystic structure found in the popliteal fossa.  *See table(s) above for measurements and observations.    Preliminary              LOS: 0 days    Time spent: 62 min    Emeterio Reeve, DO [facility] 04/20/2022, 6:45 PM    Dictation software may have been used to generate the above note. Typos may occur and escape review in typed/dictated notes. Please contact Dr Sheppard Coil directly for clarity if needed.  Staff may message me via secure chat in [place]  but this may not receive an immediate response,  please page me for urgent matters!  If 7PM-7AM, please contact night coverage [email or web address]

## 2022-04-20 NOTE — Consult Note (Signed)
[facility] Nurse Consult Note: Reason for Consult:Bilateral LEs with erythema and edema. No wounds. Wound type:venous insufficiency. No DVT, No Baker's cysts Pressure Injury POA: N/A Measurement:N/A Wound bed:N/A Drainage (amount, consistency, odor) small serous Periwound:as noted above Dressing procedure/placement/frequency: Bilateral Unna's Boots to be placed today by Ortho Tech and changed twice weekly. First change this week to be Thursday, then Mon/Thurs thereafter. Patient will require HHRN follow up (which will require a PCP prior to discharge) or referral to an outpatient wound care center of her choosing. Heels are to be floated, a sacral foam placed for PI prevention. Turning and repositioning is in place.  Pressure redistribution chair cushion is provided.  Dr. Sheppard Coil and I have communicated via [place].  [facility] nursing team will not follow, but will remain available to this patient, the nursing and medical teams.  Please re-consult if needed.  Thank you for inviting [place] to participate in this patient's Plan of Care.  Maudie Flakes, MSN, RN, CNS, [place], Serita Grammes, [employer], [employer] phone:  [phone]

## 2022-04-20 NOTE — ED Provider Notes (Signed)
[place] EMERGENCY DEPARTMENT AT [facility] Provider Note   CSN: [id number] Arrival date & time: 04/20/22  1349     History  Chief Complaint  Patient presents with   Leg Swelling    Denise Kelly is a 82 y.o. female.  Pt is a 82 yo female with a pmhx significant for gerd, DVT/PE, arthritis, htn, depression, cellulitis, and skin cancer.  Pt was dismissed from her pcp's office because she missed some appointments.  Pt has not been on any of her usual meds since then.  Pt was here on 1/20 with cellulitis.  She was d/c with keflex and a 5 day supply of lasix.  She said it got a little better, but then came back after she finished those meds.  She did not have a pcp to call and get more.  She also has not been taking her Eliquis or any other of her meds.       Home Medications Prior to Admission medications   Medication Sig Start Date End Date Taking? Authorizing Provider  acetaminophen (TYLENOL) 325 MG tablet Take 2 tablets (650 mg total) by mouth every 6 (six) hours as needed for mild pain. Patient not taking: Reported on 04/06/2022 01/25/18   Elgergawy, Silver Huguenin, MD  acetaminophen (TYLENOL) 500 MG tablet Take 500 mg by mouth every 6 (six) hours as needed for moderate pain.    [provider]  apixaban (ELIQUIS) 5 MG TABS tablet Take 1 tablet (5 mg total) by mouth 2 (two) times daily. 01/17/21 02/16/21  Aline August, MD  apixaban (ELIQUIS) 5 MG TABS tablet Take 5 mg by mouth 2 (two) times daily.    [provider]  cephALEXin (KEFLEX) 500 MG capsule Take 1 capsule (500 mg total) by mouth 3 (three) times daily. 05/23/63   Delora Fuel, MD  folic acid (FOLVITE) 1 MG tablet Take 1 tablet (1 mg total) by mouth daily. Patient not taking: Reported on 04/06/2022 01/18/21   Aline August, MD  furosemide (LASIX) 40 MG tablet Take 1 tablet (40 mg total) by mouth daily. 7/84/69   Delora Fuel, MD  loperamide (IMODIUM) 2 MG capsule Take 1 capsule (2 mg total) by  mouth 2 (two) times daily as needed for diarrhea or loose stools. Patient not taking: Reported on 04/06/2022 10/03/17   Nita Sells, MD  ondansetron (ZOFRAN) 4 MG tablet Take 1 tablet (4 mg total) by mouth every 8 (eight) hours as needed for nausea or vomiting. 01/17/21   Aline August, MD  pantoprazole (PROTONIX) 40 MG tablet Take 1 tablet (40 mg total) by mouth daily. Patient not taking: Reported on 04/06/2022 01/17/21   Aline August, MD  thiamine 100 MG tablet Take 1 tablet (100 mg total) by mouth daily. Patient not taking: Reported on 04/06/2022 01/18/21   Aline August, MD      Allergies    Sulfa antibiotics, Coreg [carvedilol], Motrin [ibuprofen], Toprol xl [metoprolol tartrate], Doxycycline hyclate, Flovent hfa [fluticasone], and Statins    Review of Systems   Review of Systems  Cardiovascular:  Positive for leg swelling.  Skin:  Positive for color change.  All other systems reviewed and are negative.   Physical Exam Updated Vital Signs BP 128/82   Pulse 93   Temp 97.6 F (36.4 C) (Oral)   Resp 13   SpO2 96%  Physical Exam Vitals and nursing note reviewed.  Constitutional:      Appearance: Normal appearance. She is obese.  HENT:  Head: Normocephalic and atraumatic.     Right Ear: External ear normal.     Left Ear: External ear normal.     Nose: Nose normal.     Mouth/Throat:     Mouth: Mucous membranes are dry.  Eyes:     Extraocular Movements: Extraocular movements intact.     Conjunctiva/sclera: Conjunctivae normal.     Pupils: Pupils are equal, round, and reactive to light.  Cardiovascular:     Rate and Rhythm: Normal rate and regular rhythm.     Pulses: Normal pulses.     Heart sounds: Normal heart sounds.  Pulmonary:     Effort: Pulmonary effort is normal.     Breath sounds: Normal breath sounds.  Abdominal:     General: Abdomen is flat. Bowel sounds are normal.     Palpations: Abdomen is soft.  Musculoskeletal:        General: Normal range of  motion.     Cervical back: Normal range of motion and neck supple.     Right lower leg: Edema present.     Left lower leg: Edema present.     Comments: BLE swelling and cellulitis  Skin:    Capillary Refill: Capillary refill takes less than 2 seconds.  Neurological:     General: No focal deficit present.     Mental Status: She is alert and oriented to person, place, and time.  Psychiatric:        Mood and Affect: Mood normal.        Behavior: Behavior normal.     ED Results / Procedures / Treatments   Labs (all labs ordered are listed, but only abnormal results are displayed) Labs Reviewed  BASIC METABOLIC PANEL - Abnormal; Notable for the following components:      Result Value   Glucose, Bld 101 (*)    Calcium 8.1 (*)    All other components within normal limits  CBC WITH DIFFERENTIAL/PLATELET - Abnormal; Notable for the following components:   RDW 20.4 (*)    All other components within normal limits  BASIC METABOLIC PANEL  CBC  CREATININE, SERUM    EKG EKG Interpretation  Date/Time:  Sunday April 20 2022 15:02:45 EST Ventricular Rate:  88 PR Interval:  172 QRS Duration: 142 QT Interval:  451 QTC Calculation: 546 R Axis:   -61 Text Interpretation: Sinus rhythm RBBB and LAFB Baseline wander in lead(s) V1 V2 V3 V4 V5 V6 Since last tracing rate slower Confirmed by Isla Pence [phone]) on 04/20/2022 3:19:49 PM  Radiology VAS [place] LOWER EXTREMITY VENOUS (DVT) (ONLY [facility] & [facility])  Result Date: 04/20/2022  Lower Venous DVT Study Patient Name:  Denise Kelly  Date of Exam:   04/20/2022 Medical Rec #: [id number]        Accession #:    [id number] Date of Birth: Nov 30, 1940        Patient Gender: F Patient Age:   8 years Exam Location:  [facility] Procedure:      VAS [place] LOWER EXTREMITY VENOUS (DVT) Referring Phys: Jasmene Goswami --------------------------------------------------------------------------------  Indications: Pain, swelling, redness in the setting of  cellulitis and lymphedema. Other Indications: History of DVT/PE. Patient previously on Eliquis. Limitations: Patient intolerant to moderate probe pressure, highly intolerant to compressions, constant patient movement, tissue properties. Comparison Study: 01-14-2021 Prior bilateral lower extremity venous study was                   limited due to patient intolerance to  compression, but                   negative for DVT. Performing Technologist: Darlin Coco RDMS, RVT  Examination Guidelines: A complete evaluation includes B-mode imaging, spectral Doppler, color Doppler, and power Doppler as needed of all accessible portions of each vessel. Bilateral testing is considered an integral part of a complete examination. Limited examinations for reoccurring indications may be performed as noted. The reflux portion of the exam is performed with the patient in reverse Trendelenburg.  +---------+---------------+---------+-----------+----------+-------------------+ RIGHT    CompressibilityPhasicitySpontaneityPropertiesThrombus Aging      +---------+---------------+---------+-----------+----------+-------------------+ CFV      Full           Yes      Yes                                      +---------+---------------+---------+-----------+----------+-------------------+ SFJ      Full                                                             +---------+---------------+---------+-----------+----------+-------------------+ FV Prox  Full                                                             +---------+---------------+---------+-----------+----------+-------------------+ FV Mid   Full                                                             +---------+---------------+---------+-----------+----------+-------------------+ FV DistalFull                                                             +---------+---------------+---------+-----------+----------+-------------------+ PFV       Full                                                             +---------+---------------+---------+-----------+----------+-------------------+ POP                     Yes      Yes                  Unable to tolerate                                                        compression         +---------+---------------+---------+-----------+----------+-------------------+  PTV                     Yes      Yes                  Patent by color-                                                          unable to tolerate                                                        compression         +---------+---------------+---------+-----------+----------+-------------------+ PERO                    Yes      Yes                  Patent by color-                                                          unable to tolerate                                                        compression         +---------+---------------+---------+-----------+----------+-------------------+  +---------+---------------+---------+-----------+----------+-------------------+ LEFT     CompressibilityPhasicitySpontaneityPropertiesThrombus Aging      +---------+---------------+---------+-----------+----------+-------------------+ CFV      Full           Yes      Yes                                      +---------+---------------+---------+-----------+----------+-------------------+ SFJ      Full                                                             +---------+---------------+---------+-----------+----------+-------------------+ FV Prox  Full                                                             +---------+---------------+---------+-----------+----------+-------------------+ FV Mid   Full                                                             +---------+---------------+---------+-----------+----------+-------------------+  FV Distal                Yes      Yes                  Patent by color-                                                          unable to tolerate                                                        compression         +---------+---------------+---------+-----------+----------+-------------------+ PFV      Full                                                             +---------+---------------+---------+-----------+----------+-------------------+ POP                     Yes      Yes                                      +---------+---------------+---------+-----------+----------+-------------------+ PTV                                                   Patent by color-                                                          unable to tolerate                                                        compression         +---------+---------------+---------+-----------+----------+-------------------+ PERO                                                  Not visualized-                                                           unable to contact  skin with probe                                                           without patient                                                           movement/pain       +---------+---------------+---------+-----------+----------+-------------------+    Summary: RIGHT: - There is no evidence of deep vein thrombosis in the lower extremity. However, portions of this examination were limited- see technologist comments above.  - No cystic structure found in the popliteal fossa.  LEFT: - There is no evidence of deep vein thrombosis in the lower extremity. However, portions of this examination were limited- see technologist comments above.  - No cystic structure found in the popliteal fossa.  *See table(s) above for measurements and observations.     Preliminary     Procedures Procedures    Medications Ordered in ED Medications  cefTRIAXone (ROCEPHIN) 2 g in sodium chloride 0.9 % 100 mL IVPB (has no administration in time range)  acetaminophen (TYLENOL) tablet 650 mg (has no administration in time range)    Or  acetaminophen (TYLENOL) suppository 650 mg (has no administration in time range)  oxyCODONE (Oxy IR/ROXICODONE) immediate release tablet 5 mg (has no administration in time range)  furosemide (LASIX) injection 40 mg (has no administration in time range)  enoxaparin (LOVENOX) injection 40 mg (has no administration in time range)  cefTRIAXone (ROCEPHIN) 1 g in sodium chloride 0.9 % 100 mL IVPB (0 g Intravenous Stopped 04/20/22 1624)  furosemide (LASIX) injection 20 mg (20 mg Intravenous Given 04/20/22 1453)    ED Course/ Medical Decision Making/ A&P                             Medical Decision Making Amount and/or Complexity of Data Reviewed Labs: ordered.  Risk OTC drugs. Prescription drug management. Decision regarding hospitalization.   This patient presents to the ED for concern of swelling and redness, this involves an extensive number of treatment options, and is a complaint that carries with it a high risk of complications and morbidity.  The differential diagnosis includes cellulitis, dvt, lymphedema   Co morbidities that complicate the patient evaluation  gerd, DVT/PE, arthritis, htn, depression, cellulitis, and skin cancer   Additional history obtained:  Additional history obtained from epic chart review External records from outside source obtained and reviewed including EMS report   Lab Tests:  I Ordered, and personally interpreted labs.  The pertinent results include:  cbc nl, bmp nl   Imaging Studies ordered:  I ordered imaging studies including [place] LE  I independently visualized and interpreted imaging which showed neg for DVT I agree with the radiologist interpretation   Cardiac  Monitoring:  The patient was maintained on a cardiac monitor.  I personally viewed and interpreted the cardiac monitored which showed an underlying rhythm of: nsr   Medicines ordered and prescription drug management:  I ordered medication including rocephin  for cellulitis  Reevaluation of the patient after these  medicines showed that the patient improved I have reviewed the patients home medicines and have made adjustments as needed   Test Considered:  [place]   Critical Interventions:  Iv abx   Consultations Obtained:  I requested consultation with the hospitalist (Dr. Sheppard Coil),  and discussed lab and imaging findings as well as pertinent plan - she will admit   Problem List / ED Course:  BLE cellulitis:  sx improved a little on keflex, but came back after stopping it.   Reevaluation:  After the interventions noted above, I reevaluated the patient and found that they have :improved   Social Determinants of Health:  Lives at home; no pcp   Dispostion:  After consideration of the diagnostic results and the patients response to treatment, I feel that the patent would benefit from admission.          Final Clinical Impression(s) / ED Diagnoses Final diagnoses:  Peripheral edema  Cellulitis of right lower extremity  Cellulitis of left lower extremity  Failure of outpatient treatment  Noncompliance with medication regimen    Rx / DC Orders ED Discharge Orders     None         Isla Pence, MD 04/20/22 1815

## 2022-04-21 DIAGNOSIS — L03119 Cellulitis of unspecified part of limb: Secondary | ICD-10-CM | POA: Diagnosis not present

## 2022-04-21 DIAGNOSIS — F1020 Alcohol dependence, uncomplicated: Secondary | ICD-10-CM | POA: Diagnosis not present

## 2022-04-21 DIAGNOSIS — F3289 Other specified depressive episodes: Secondary | ICD-10-CM | POA: Diagnosis not present

## 2022-04-21 DIAGNOSIS — G894 Chronic pain syndrome: Secondary | ICD-10-CM | POA: Diagnosis not present

## 2022-04-21 LAB — BASIC METABOLIC PANEL
Anion gap: 10 (ref 5–15)
BUN: 18 mg/dL (ref 8–23)
CO2: 28 mmol/L (ref 22–32)
Calcium: 8.3 mg/dL — ABNORMAL LOW (ref 8.9–10.3)
Chloride: 99 mmol/L (ref 98–111)
Creatinine, Ser: 0.82 mg/dL (ref 0.44–1.00)
GFR, Estimated: >60 mL/min (ref >60)
Glucose, Bld: 114 mg/dL — ABNORMAL HIGH (ref 70–99)
Potassium: 3.7 mmol/L (ref 3.5–5.1)
Sodium: 137 mmol/L (ref 135–145)

## 2022-04-21 MED ORDER — FUROSEMIDE 10 MG/ML IJ SOLN
40.0000 mg | Freq: Every day | INTRAMUSCULAR | Status: DC
  Administered 2022-04-22 – 2022-04-23 (×2): 40 mg via INTRAVENOUS
  Filled 2022-04-21 (×2): qty 4

## 2022-04-21 MED ORDER — CAMPHOR-MENTHOL 0.5-0.5 % EX LOTN
TOPICAL_LOTION | CUTANEOUS | Status: DC | PRN
  Filled 2022-04-21: qty 222

## 2022-04-21 MED ORDER — MELATONIN 5 MG PO TABS
5.0000 mg | ORAL_TABLET | Freq: Every evening | ORAL | Status: AC | PRN
  Administered 2022-04-22 (×2): 5 mg via ORAL
  Filled 2022-04-21 (×2): qty 1

## 2022-04-21 MED ORDER — ONDANSETRON HCL 4 MG/2ML IJ SOLN
4.0000 mg | Freq: Four times a day (QID) | INTRAMUSCULAR | Status: DC | PRN
  Administered 2022-04-22 – 2022-04-23 (×2): 4 mg via INTRAVENOUS
  Filled 2022-04-21 (×2): qty 2

## 2022-04-21 NOTE — TOC Initial Note (Signed)
Transition of Care [facility]) - Initial/Assessment Note   Patient Details  Name: Denise Kelly MRN: [id number] Date of Birth: 09-21-1940  [facility] of [facility]) CM/SW Contact:    Sherie Don, LCSW Phone Number: 04/21/2022, 12:24 PM  Clinical Narrative: PT and OT evaluations recommended SNF. Patient is agreeable to SNF and requested [facility] as first choice. CSW also asked about ETOH use consult. Per patient, she has not consumed ETOH in 10 years and does not need any resources at this time. [id number] referral made for food; transportation information added to AVS.  FL2 done; PASRR confirmed. Initial referral faxed out. CSW followed up with Linus Orn in admissions at [facility] to have the referral reviewed. TOC awaiting bed offers.  Expected Discharge Plan: Skilled Nursing Facility Barriers to Discharge: Continued Medical Work up, Ship broker, SNF Pending bed offer  Patient Goals and CMS Choice Patient states their goals for this hospitalization and ongoing recovery are:: Go to SNF before returning home CMS Medicare.gov Compare Post Acute Care list provided to:: Patient Choice offered to / list presented to : Patient  Expected Discharge Plan and Services In-house Referral: Clinical Social Work Discharge Planning Services: NA Post Acute Care Choice: [place] Living arrangements for the past 2 months: Apartment              DME Arranged: N/A DME Agency: NA  Prior Living Arrangements/Services Living arrangements for the past 2 months: Apartment Patient language and need for interpreter reviewed:: Yes Do you feel safe going back to the place where you live?: Yes      Need for Family Participation in Patient Care: No (Comment) Care giver support system in place?: No (comment) Criminal Activity/Legal Involvement Pertinent to Current Situation/Hospitalization: No - Comment as needed  Activities of Daily Living Home Assistive Devices/Equipment:  Gilford Rile (specify type) ADL Screening (condition at time of admission) Patient's cognitive ability adequate to safely complete daily activities?: Yes Is the patient deaf or have difficulty hearing?: Yes Does the patient have difficulty seeing, even when wearing glasses/contacts?: No Does the patient have difficulty concentrating, remembering, or making decisions?: Yes Patient able to express need for assistance with ADLs?: Yes Does the patient have difficulty dressing or bathing?: No Independently performs ADLs?: Yes (appropriate for developmental age) Does the patient have difficulty walking or climbing stairs?: Yes (with [place], pt says no problem) Weakness of Legs: Both Weakness of Arms/Hands: None  Permission Sought/Granted Permission sought to share information with : Facility Art therapist granted to share information with : Yes, Verbal Permission Granted Permission granted to share info w AGENCY: SNFs  Emotional Assessment Attitude/Demeanor/Rapport: Engaged Affect (typically observed): Accepting Orientation: : Oriented to Self, Oriented to Place, Oriented to  Time, Oriented to Situation Alcohol / Substance Use: Not Applicable (Patient reported she has not consumed any ETOH for 10 years.) Psych Involvement: No (comment)  Admission diagnosis:  Cellulitis [L03.90] Peripheral edema [R60.9] Noncompliance with medication regimen [Z91.148] Cellulitis of left lower extremity [L03.116] Cellulitis of right lower extremity [L03.115] Failure of outpatient treatment [Z78.9] Patient Active Problem List   Diagnosis Date Noted   Cellulitis 04/20/2022   Lymphedema 04/20/2022   History of deep vein thrombosis (DVT) of lower extremity 04/20/2022   History of pulmonary embolus (PE) 04/20/2022   UTI (urinary tract infection) 95/63/8756   Acute metabolic encephalopathy 43/32/9518   Upper GI bleed 01/23/2018   Leukocytosis 01/23/2018   Physical deconditioning 01/23/2018    Acute pulmonary embolus (HCC) 01/04/2018   Acute  encephalopathy 09/28/2017   Constipation, slow transit 09/28/2017   Unsteady gait 04/28/2017   Spondylolisthesis 07/17/2016   Bilateral lower extremity edema 07/17/2016   Chronic pain syndrome    Depression 05/23/2016   Chronic back pain 02/25/2016   Lesion of liver 02/25/2016   History of fracture of left hip 11/28/2015   Fall 11/28/2015   RLS (restless legs syndrome) 11/28/2015   Confusion 03/14/2014   Recurrent UTI 03/14/2014   Effusion of right knee 02/22/2014   Alcohol dependence ([place]) 02/22/2014   Substance induced mood disorder ([place]) 02/22/2014   Hepatic encephalopathy (HCC)    Altered mental status 02/21/2014   SIRS (systemic inflammatory response syndrome) ([place]) 02/21/2014   S/P total knee arthroplasty 01/02/2014   Essential hypertension, benign 11/10/2013   LAFB (left anterior fascicular block) 11/10/2013   Obesity, unspecified 11/10/2013   PCP:  [name], MD Pharmacy:   [id number] [place] [id number]: [phone]: [phone] Social Determinants of Health (SDOH) Social History: SDOH Screenings   Food Insecurity: Food Insecurity Present (04/21/2022)  Housing: Low Risk  (04/21/2022)  Transportation Needs: Unmet Transportation Needs (04/21/2022)  Utilities: Not At Risk (04/21/2022)  Depression (PHQ2-9): Low Risk  (01/09/2019)  Tobacco Use: Medium Risk (04/20/2022)   SDOH Interventions:    Readmission Risk Interventions     No data to display

## 2022-04-21 NOTE — Evaluation (Signed)
Physical Therapy Evaluation Patient Details Name: Denise Kelly MRN: [id number] DOB: 1940-05-15 Today's Date: 04/21/2022  History of Present Illness  Patient is a 82 year old female who presented with AMS urinary frequency, fever, and BLE edema. Patient was found to have UTI, SIRS, acute metabolic encephalopathy. PMH: PE, alcohol dependence, depression, chronic pain syndrome, medical noncompliance, R TKA, L TKA, lumbar fusion.  Clinical Impression  Pt admitted with above diagnosis.  . Patient was living at home alone with reports of recent loss of daughter who supported her for IADLs.  Patient now with decline in mobility and limited support at home from friend.   Recommend SNF  Pt currently with functional limitations due to the deficits listed below (see PT Problem List). Pt will benefit from skilled PT to increase their independence and safety with mobility to allow discharge to the venue listed below.          Recommendations for follow up therapy are one component of a multi-disciplinary discharge planning process, led by the attending physician.  Recommendations may be updated based on patient status, additional functional criteria and insurance authorization.  Follow Up Recommendations Skilled nursing-short term rehab (<3 hours/day) Can patient physically be transported by private vehicle: No    Assistance Recommended at Discharge Intermittent Supervision/Assistance  Patient can return home with the following       Equipment Recommendations None recommended by PT  Recommendations for Other Services       Functional Status Assessment Patient has had a recent decline in their functional status and demonstrates the ability to make significant improvements in function in a reasonable and predictable amount of time.     Precautions / Restrictions Precautions Precautions: Fall Precaution Comments: back precautions Restrictions Weight Bearing Restrictions: No      Mobility   Bed Mobility               General bed mobility comments:  (to EOB with OT)    Transfers Overall transfer level: Needs assistance Equipment used: Rolling walker (2 wheels) Transfers: Sit to/from Stand, Bed to chair/wheelchair/BSC Sit to Stand: Min assist   Step pivot transfers: Min assist, Min guard       General transfer comment: assist to rise and transition to RW, safety cues; afew steps fwd and back, further gait limited by fatigue    Ambulation/Gait                  Stairs            Wheelchair Mobility    Modified Rankin (Stroke Patients Only)       Balance Overall balance assessment: Needs assistance         Standing balance support: Reliant on assistive device for balance, During functional activity Standing balance-Leahy Scale: Poor                               Pertinent Vitals/Pain Pain Assessment Pain Assessment: Faces Faces Pain Scale: Hurts little more Pain Location: BLE Pain Descriptors / Indicators: Grimacing, Discomfort Pain Intervention(s): Limited activity within patient's tolerance, Monitored during session, Premedicated before session    Home Living Family/patient expects to be discharged to:: Private residence Living Arrangements: Alone Available Help at Discharge: Neighbor Type of Home: Apartment Home Access: Stairs to enter   [employer] of Steps: 1   Home Layout: One level Home Equipment: Conservation officer, nature (2 wheels) Additional Comments: patient mentioned that she had some  caregiver support but was unclear on how often this person assisted her.    Prior Function Prior Level of Function : Needs assist       Physical Assist : Mobility (physical);ADLs (physical) Mobility (physical): Gait ADLs (physical): Bathing;Dressing;IADLs Mobility Comments: uses walker, reports being very fearful of falling ADLs Comments: Patient reports being independent. patient had help from daughter in past for  ADLs and IADLs. noted that daughter has passed since last admission     Hand Dominance   Dominant Hand: Right    Extremity/Trunk Assessment   Upper Extremity Assessment Upper Extremity Assessment: Defer to OT evaluation;Overall [facility] for tasks assessed    Lower Extremity Assessment Lower Extremity Assessment: Overall WFL for tasks assessed    Cervical / Trunk Assessment Cervical / Trunk Assessment: Normal  Communication   Communication: HOH  Cognition Arousal/Alertness: Awake/alert Behavior During Therapy: WFL for tasks assessed/performed Overall Cognitive Status: Difficult to assess                                 General Comments: Patient is noted to repeat story of EMS coming to get her from the house x3 during conversation. patient reported daughter has passed from suicide as well who was patient support at home. patient oriented to place but has a history of needed A with meds and problem solving for safety. also noted in chart to have h/o noncompliance with medications        General Comments      Exercises     Assessment/Plan    PT Assessment Patient needs continued PT services  PT Problem List Decreased strength;Decreased range of motion;Decreased activity tolerance;Decreased balance;Decreased mobility;Decreased skin integrity       PT Treatment Interventions DME instruction;Therapeutic exercise;Gait training;Functional mobility training;Therapeutic activities;Patient/family education    PT Goals (Current goals can be found in the Care Plan section)  Acute Rehab PT Goals Patient Stated Goal: none stated PT Goal Formulation: With patient Time For Goal Achievement: 05/05/22 Potential to Achieve Goals: Fair    Frequency Min 2X/week     Co-evaluation PT/OT/SLP Co-Evaluation/Treatment: Yes Reason for Co-Treatment: To address functional/ADL transfers PT goals addressed during session: Mobility/safety with mobility OT goals addressed during  session: ADL's and self-care       AM-PAC PT "6 Clicks" Mobility  Outcome Measure Help needed turning from your back to your side while in a flat bed without using bedrails?: A Little Help needed moving from lying on your back to sitting on the side of a flat bed without using bedrails?: A Little Help needed moving to and from a bed to a chair (including a wheelchair)?: A Little Help needed standing up from a chair using your arms (e.g., wheelchair or bedside chair)?: A Little Help needed to walk in hospital room?: A Lot Help needed climbing 3-5 steps with a railing? : Total 6 Click Score: 15    End of Session Equipment Utilized During Treatment: Gait belt Activity Tolerance: Patient limited by fatigue Patient left: in chair;with call bell/phone within reach;with chair alarm set Nurse Communication: Mobility status PT Visit Diagnosis: Other abnormalities of gait and mobility (R26.89);Difficulty in walking, not elsewhere classified (R26.2)    Time: 6546-5035 PT Time Calculation (min) (ACUTE ONLY): 12 min   Charges:   PT Evaluation $PT Eval Low Complexity: [place], PT  Acute Rehab Dept ([facility]) [phone]  Dirk Dress Weekend  Pager ([place] only)  [phone]  04/21/2022   [facility] 04/21/2022, 11:51 AM

## 2022-04-21 NOTE — Progress Notes (Signed)
PROGRESS NOTE    Denise Kelly  IRC:[id number] DOB: 08-07-1940 DOA: 04/20/2022 PCP: Denise Small, MD    Brief Narrative:  ABBIEGAIL Kelly is a 82 y.o. female with past medical history of chronic pain syndrome, and depression, alcohol dependence, lymphedema, GERD, hypertension, presented to the  hospital with lower extremity swelling and pain.  Patient is a poor historian with hard of hearing.  Patient was recently seen in the ED for similar complaints and was given antibiotics and Lasix with her PCP follow-up but states that her swelling and pain returned after stopping the antibiotic.  Patient does have a history of lymphedema and used to get therapy in the past and had followed up with vascular surgery.  History of DVT PE 2018 unprovoked and was on Eliquis for 6 months at that time.  In the ED, patient was treated with Rocephin, Lasix 20 mg and fentanyl and was admitted hospital for further evaluation and treatment.   Assessment and Plan:  Mild cellulitis with underlying Lymphedema with some oozing. Ultrasound of the lower extremity was negative.  Was given ceftriaxone.  Patient does have low-grade cellulitis and will continue Rocephin for now.  Will benefit from [employer] application.  Will likely benefit from home health or wound care.  Will get wound care consultation.   Medical noncompliance Possibly due to cognitive dysfunction, hearing impaired, depression related.  Patient has not been followed up by PCP and had been fired due to several missed appointments.  TOC consult has been made for PCP and medication assistance.     Alcohol dependence ([place]) No recent use of alcohol.  Will continue to monitor.   Depression/ Chronic pain syndrome Pain is under control at this time.  States that she continues to have some pain in the lower extremity.  History of deep vein thrombosis (DVT) of lower extremity History of pulmonary embolus (PE)  History PE and DVT 12/2017.  As per the last  discharge, she was supposed to be on Eliquis for 6 months and indefinitely if sedentary lifestyle does not change.  Might benefit from Eliquis for DVT prevention  Debility weakness.  Physical therapy has seen the patient and recommended skilled nursing facility placement at this time.      DVT prophylaxis: enoxaparin (LOVENOX) injection 40 mg Start: 04/21/22 1000   Code Status:     Code Status: Full Code  Disposition: Skilled nursing facility as per PT OT evaluation.  Status is: Observation  The patient will require care spanning > 2 midnights and should be moved to inpatient because:  IV antibiotic, need for rehabilitation.   Family Communication: None at bedside  Consultants:  None  Procedures:  None  Antimicrobials:  Rocephin IV  Anti-infectives (From admission, onward)    Start     Dose/Rate Route Frequency Ordered Stop   04/21/22 1500  cefTRIAXone (ROCEPHIN) 2 g in sodium chloride 0.9 % 100 mL IVPB        2 g 200 mL/hr over 30 Minutes Intravenous Every 24 hours 04/20/22 1800 04/28/22 1459   04/20/22 1430  cefTRIAXone (ROCEPHIN) 1 g in sodium chloride 0.9 % 100 mL IVPB        1 g 200 mL/hr over 30 Minutes Intravenous  Once 04/20/22 1424 04/20/22 1624       Subjective: Today, patient was seen and examined at bedside.  Patient states that she feels weak and fatigued with lower extremity swelling weeping edema and tenderness.  Had to take some pain pills.  Noted by physical therapy to be weak  Objective: Vitals:   04/20/22 2358 04/21/22 0121 04/21/22 0517 04/21/22 1056  BP:  133/60 109/66 (!) 145/75  Pulse:  (!) 108 90 93  Resp:  '16 18 16  '$ Temp:  (!) 97.5 F (36.4 C) 98.4 F (36.9 C) 98.3 F (36.8 C)  TempSrc:  Oral Oral Oral  SpO2:  93% 92% 97%  Weight: 86.6 kg     Height: '5\' 7"'$  (1.702 m)       Intake/Output Summary (Last 24 hours) at 04/21/2022 1349 Last data filed at 04/21/2022 1000 Gross per 24 hour  Intake 1377 ml  Output 1950 ml  Net -573 ml    Filed Weights   04/20/22 2358  Weight: 86.6 kg    Physical Examination: Body mass index is 29.9 kg/m.  General:  Average built, not in obvious distress, alert awake and Communicative.  Likely female HENT:   No scleral pallor or icterus noted. Oral mucosa is moist.  Chest:  Clear breath sounds.  Diminished breath sounds bilaterally. No crackles or wheezes.  CVS: S1 &S2 heard. No murmur.  Regular rate and rhythm. Abdomen: Soft, nontender, nondistended.  Bowel sounds are heard.   Extremities: Bilateral  lower extremity with erythema edema tenderness with some area of blister and weeping. Psych: Alert, awake and Communicative, oriented to place and person. CNS:  No cranial nerve deficits.  Power equal in all extremities.  Generalized weakness noted Skin: Warm and dry.  No rashes noted.  Data Reviewed:   CBC: Recent Labs  Lab 04/20/22 1302  WBC 5.8  NEUTROABS 3.0  HGB 13.4  HCT 41.7  MCV 87.8  PLT 710    Basic Metabolic Panel: Recent Labs  Lab 04/20/22 1302 04/21/22 0402  NA 140 137  K 4.3 3.7  CL 104 99  CO2 29 28  GLUCOSE 101* 114*  BUN 11 18  CREATININE 0.78 0.82  CALCIUM 8.1* 8.3*    Liver Function Tests: No results for input(s): "AST", "ALT", "ALKPHOS", "BILITOT", "PROT", "ALBUMIN" in the last 168 hours.   Radiology Studies: VAS [place] LOWER EXTREMITY VENOUS (DVT) (ONLY [facility] & [facility])  Result Date: 04/20/2022  Lower Venous DVT Study Patient Name:  Denise Kelly  Date of Exam:   04/20/2022 Medical Rec #: [id number]        Accession #:    [id number] Date of Birth: [birth year]        Patient Gender: F Patient Age:   77 years Exam [facility]  [facility] Procedure:      VAS [place] LOWER EXTREMITY VENOUS (DVT) Referring Phys: Denise Kelly --------------------------------------------------------------------------------  Indications: Pain, swelling, redness in the setting of cellulitis and lymphedema. Other Indications: History of DVT/PE. Patient previously on Eliquis.  Limitations: Patient intolerant to moderate probe pressure, highly intolerant to compressions, constant patient movement, tissue properties. Comparison Study: 01-14-2021 Prior bilateral lower extremity venous study was                   limited due to patient intolerance to compression, but                   negative for DVT. Performing Technologist: Darlin Coco RDMS, RVT  Examination Guidelines: A complete evaluation includes B-mode imaging, spectral Doppler, color Doppler, and power Doppler as needed of all accessible portions of each vessel. Bilateral testing is considered an integral part of a complete examination. Limited examinations for reoccurring indications may be performed as noted. The reflux  portion of the exam is performed with the patient in reverse Trendelenburg.  +---------+---------------+---------+-----------+----------+-------------------+ RIGHT    CompressibilityPhasicitySpontaneityPropertiesThrombus Aging      +---------+---------------+---------+-----------+----------+-------------------+ CFV      Full           Yes      Yes                                      +---------+---------------+---------+-----------+----------+-------------------+ SFJ      Full                                                             +---------+---------------+---------+-----------+----------+-------------------+ FV Prox  Full                                                             +---------+---------------+---------+-----------+----------+-------------------+ FV Mid   Full                                                             +---------+---------------+---------+-----------+----------+-------------------+ FV DistalFull                                                             +---------+---------------+---------+-----------+----------+-------------------+ PFV      Full                                                              +---------+---------------+---------+-----------+----------+-------------------+ POP                     Yes      Yes                  Unable to tolerate                                                        compression         +---------+---------------+---------+-----------+----------+-------------------+ PTV                     Yes      Yes                  Patent by color-  unable to tolerate                                                        compression         +---------+---------------+---------+-----------+----------+-------------------+ PERO                    Yes      Yes                  Patent by color-                                                          unable to tolerate                                                        compression         +---------+---------------+---------+-----------+----------+-------------------+  +---------+---------------+---------+-----------+----------+-------------------+ LEFT     CompressibilityPhasicitySpontaneityPropertiesThrombus Aging      +---------+---------------+---------+-----------+----------+-------------------+ CFV      Full           Yes      Yes                                      +---------+---------------+---------+-----------+----------+-------------------+ SFJ      Full                                                             +---------+---------------+---------+-----------+----------+-------------------+ FV Prox  Full                                                             +---------+---------------+---------+-----------+----------+-------------------+ FV Mid   Full                                                             +---------+---------------+---------+-----------+----------+-------------------+ FV Distal               Yes      Yes                  Patent by color-  unable to tolerate                                                        compression         +---------+---------------+---------+-----------+----------+-------------------+ PFV      Full                                                             +---------+---------------+---------+-----------+----------+-------------------+ POP                     Yes      Yes                                      +---------+---------------+---------+-----------+----------+-------------------+ PTV                                                   Patent by color-                                                          unable to tolerate                                                        compression         +---------+---------------+---------+-----------+----------+-------------------+ PERO                                                  Not visualized-                                                           unable to contact                                                         skin with probe  without patient                                                           movement/pain       +---------+---------------+---------+-----------+----------+-------------------+    Summary: RIGHT: - There is no evidence of deep vein thrombosis in the lower extremity. However, portions of this examination were limited- see technologist comments above.  - No cystic structure found in the popliteal fossa.  LEFT: - There is no evidence of deep vein thrombosis in the lower extremity. However, portions of this examination were limited- see technologist comments above.  - No cystic structure found in the popliteal fossa.  *See table(s) above for measurements and observations.    Preliminary       LOS: 0 days    Flora Lipps, MD [facility] Available via Epic secure chat 7am-7pm After these hours, please refer to coverage provider listed on [email or web address] 04/21/2022, 1:49 PM

## 2022-04-21 NOTE — Evaluation (Signed)
Occupational Therapy Evaluation Patient Details Name: Denise Kelly MRN: [id number] DOB: Sep 20, 1940 Today's Date: 04/21/2022   History of Present Illness Patient is a 82 year old female who presented with AMS urinary frequency, fever, and BLE edema. Patient was found to have UTI, SIRS, acute metabolic encephalopathy. PMH: PE, alcohol dependence, depression, chronic pain syndrome, medical noncompliance, R TKA, L TKA, lumbar fusion.   Clinical Impression   Patient is a 82 year old female who was admitted for above. Patient was living at home alone with reports of recent loss of daughter who supported her for IADLs.Patient was noted to have had a decline in the ability to participate in ADLs with limited support at home from friend. Patient was noted to have decreased functional activity tolerance, decreased endurance, decreased standing balance, decreased safety awareness, and decreased knowledge of AD/AE impacting participation in ADLs. Patient would continue to benefit from skilled OT services at this time while admitted and after d/c to address noted deficits in order to improve overall safety and independence in ADLs.        Recommendations for follow up therapy are one component of a multi-disciplinary discharge planning process, led by the attending physician.  Recommendations may be updated based on patient status, additional functional criteria and insurance authorization.   Follow Up Recommendations  Skilled nursing-short term rehab (<3 hours/day)     Assistance Recommended at Discharge Frequent or constant Supervision/Assistance  Patient can return home with the following A little help with walking and/or transfers;A lot of help with bathing/dressing/bathroom;Assistance with cooking/housework;Direct supervision/assist for medications management;Assist for transportation;Help with stairs or ramp for entrance;Direct supervision/assist for financial management    Functional Status  Assessment  Patient has had a recent decline in their functional status and demonstrates the ability to make significant improvements in function in a reasonable and predictable amount of time.  Equipment Recommendations  None recommended by OT    Recommendations for Other Services       Precautions / Restrictions Precautions Precautions: Fall Precaution Comments: back precautions Restrictions Weight Bearing Restrictions: No      Mobility Bed Mobility Overal bed mobility: Needs Assistance Bed Mobility: Supine to Sit     Supine to sit: Min assist     General bed mobility comments: with use of bed rail and cues for back precautions           ADL either performed or assessed with clinical judgement   ADL Overall ADL's : Needs assistance/impaired Eating/Feeding: Set up;Sitting   Grooming: Wash/dry face;Set up;Sitting Grooming Details (indicate cue type and reason): in recliner Upper Body Bathing: Minimal assistance;Sitting   Lower Body Bathing: Maximal assistance;Sitting/lateral leans   Upper Body Dressing : Minimal assistance;Sitting   Lower Body Dressing: Maximal assistance;Sitting/lateral leans Lower Body Dressing Details (indicate cue type and reason): unable to complete figure four positioning Toilet Transfer: Minimal assistance;+2 for physical assistance;+2 for safety/equipment;Ambulation;Rolling walker (2 wheels) Toilet Transfer Details (indicate cue type and reason): to transfer to recliner in room with increased cues for sequencing of task.                 Vision Baseline Vision/History: 1 Wears glasses              Pertinent Vitals/Pain Pain Assessment Pain Assessment: Faces Faces Pain Scale: Hurts little more Pain Location: BLE Pain Descriptors / Indicators: Grimacing, Discomfort Pain Intervention(s): Limited activity within patient's tolerance, Monitored during session, Premedicated before session     Hand Dominance Right  Extremity/Trunk Assessment Upper Extremity Assessment Upper Extremity Assessment: Overall WFL for tasks assessed   Lower Extremity Assessment Lower Extremity Assessment: Defer to PT evaluation   Cervical / Trunk Assessment Cervical / Trunk Assessment: Normal   Communication Communication Communication: HOH   Cognition Arousal/Alertness: Awake/alert Behavior During Therapy: WFL for tasks assessed/performed Overall Cognitive Status: Difficult to assess       General Comments: Patient is noted to repeat story of EMS comingot get her from the house x3 during conversation. patient reproted daughter has passed fro suicide as well who was patient support at home. patient oriented to place but has a history of needed A with meds and problem solving for safety. also noted in chart to have h/o noncompliance with medications                Home Living Family/patient expects to be discharged to:: Private residence Living Arrangements: Alone Available Help at Discharge: Neighbor Type of Home: Apartment Home Access: Stairs to enter [employer] of Steps: 1   Home Layout: One level     Bathroom Shower/Tub: Teacher, early years/pre: Standard Bathroom Accessibility: Yes How Accessible: Accessible via walker Home Equipment: [place] (2 wheels)   Additional Comments: patient mentioned that she had some caregiver support but was unclear on how often this person assisted her.      Prior Functioning/Environment Prior Level of Function : Needs assist       Physical Assist : Mobility (physical);ADLs (physical) Mobility (physical): Gait ADLs (physical): Bathing;Dressing;IADLs   ADLs Comments: Patient reports being independent. patient had help from daughter in past for ADLs and IADLs. noted that daughter has passed since last admission        OT Problem List: Decreased strength;Decreased activity tolerance;Impaired balance (sitting and/or  standing);Decreased coordination;Decreased safety awareness;Decreased knowledge of precautions;Decreased knowledge of use of DME or AE;Pain      OT Treatment/Interventions: Self-care/ADL training;Energy conservation;DME and/or AE instruction;Therapeutic exercise;Therapeutic activities;Patient/family education;Balance training    OT Goals(Current goals can be found in the care plan section) Acute Rehab OT Goals Patient Stated Goal: to go home OT Goal Formulation: With patient Time For Goal Achievement: 05/05/22 Potential to Achieve Goals: Fair  OT Frequency: Min 2X/week    Co-evaluation PT/OT/SLP Co-Evaluation/Treatment: Yes Reason for Co-Treatment: To address functional/ADL transfers PT goals addressed during session: Mobility/safety with mobility OT goals addressed during session: ADL's and self-care      AM-PAC OT "6 Clicks" Daily Activity     Outcome Measure Help from another person eating meals?: A Little Help from another person taking care of personal grooming?: A Little Help from another person toileting, which includes using toliet, bedpan, or urinal?: A Lot Help from another person bathing (including washing, rinsing, drying)?: A Lot Help from another person to put on and taking off regular upper body clothing?: A Little Help from another person to put on and taking off regular lower body clothing?: A Lot 6 Click Score: 15   End of Session Equipment Utilized During Treatment: Rolling walker (2 wheels) Nurse Communication: Other (comment) (patients request for nausea meds)  Activity Tolerance: Other (comment) (nausea) Patient left: in chair;with call bell/phone within reach;with chair alarm set  OT Visit Diagnosis: Unsteadiness on feet (R26.81);Other abnormalities of gait and mobility (R26.89);Muscle weakness (generalized) (M62.81);Pain                Time: 1856-3149 OT Time Calculation (min): 28 min Charges:  OT General Charges $OT Visit: 1 Visit OT Evaluation $OT  Eval Moderate Complexity: 1 Mod  Lamine Laton OTR/L, MS Acute Rehabilitation Department Office# [phone]   Willa Rough 04/21/2022, 10:12 AM

## 2022-04-21 NOTE — Progress Notes (Signed)
Orthopedic Tech Progress Note Patient Details:  Denise Kelly 12-11-1940 [id number]  Ortho Devices Type of Ortho Device: Haematologist Ortho Device/Splint Location: BLE Ortho Device/Splint Interventions: Ordered, Application, Adjustment   Post Interventions Patient Tolerated: Poor Instructions Provided: Care of device  Denise Kelly 04/21/2022, 6:39 PM

## 2022-04-21 NOTE — NC FL2 (Signed)
[place] LEVEL OF CARE FORM     IDENTIFICATION  Patient Name: Denise Kelly Birthdate: 08-May-1940 Sex: female Admission Date (Current Location): 04/20/2022  [facility] and [place] Number:  Herbalist and Address:  [facility],  [address], [place]      Provider Number: [id number]  Attending Physician Name and Address:  Flora Lipps, MD  Relative Name and Phone Number:       Current Level of Care: Hospital Recommended Level of Care: [place] Prior Approval Number:    Date Approved/Denied:   PASRR Number: [id number]  Discharge Plan: SNF    Current Diagnoses: Patient Active Problem List   Diagnosis Date Noted   Cellulitis 04/20/2022   Lymphedema 04/20/2022   History of deep vein thrombosis (DVT) of lower extremity 04/20/2022   History of pulmonary embolus (PE) 04/20/2022   UTI (urinary tract infection) 92/33/0076   Acute metabolic encephalopathy 22/63/3354   Upper GI bleed 01/23/2018   Leukocytosis 01/23/2018   Physical deconditioning 01/23/2018   Acute pulmonary embolus ([place]) 01/04/2018   Acute encephalopathy 09/28/2017   Constipation, slow transit 09/28/2017   Unsteady gait 04/28/2017   Spondylolisthesis 07/17/2016   Bilateral lower extremity edema 07/17/2016   Chronic pain syndrome    Depression 05/23/2016   Chronic back pain 02/25/2016   Lesion of liver 02/25/2016   History of fracture of left hip 11/28/2015   Fall 11/28/2015   RLS (restless legs syndrome) 11/28/2015   Confusion 03/14/2014   Recurrent UTI 03/14/2014   Effusion of right knee 02/22/2014   Alcohol dependence ([place]) 02/22/2014   Substance induced mood disorder ([place]) 02/22/2014   Hepatic encephalopathy (HCC)    Altered mental status 02/21/2014   SIRS (systemic inflammatory response syndrome) ([place]) 02/21/2014   S/P total knee arthroplasty 01/02/2014   Essential hypertension, benign 11/10/2013   LAFB (left anterior  fascicular block) 11/10/2013   Obesity, unspecified 11/10/2013    Orientation RESPIRATION BLADDER Height & Weight     Self, Time, Situation, Place  Normal Incontinent Weight: 190 lb 14.7 oz (86.6 kg) Height:  '5\' 7"'$  (170.2 cm)  BEHAVIORAL SYMPTOMS/MOOD NEUROLOGICAL BOWEL NUTRITION STATUS   (N/A)  (N/A) Continent Diet (Heart healthy diet)  AMBULATORY STATUS COMMUNICATION OF NEEDS Skin   Limited Assist Verbally Surgical wounds, Other (Comment) (Abrasion: bilateral legs; Erythema: bilateral legs, groin, buttocks; Weeping: bilateral legs)                       Personal Care Assistance Level of Assistance  Bathing, Feeding, Dressing Bathing Assistance: Maximum assistance Feeding assistance: Independent Dressing Assistance: Maximum assistance     Functional Limitations Info  Sight, Hearing, Speech Sight Info: Impaired Hearing Info: Impaired Speech Info: Adequate    SPECIAL CARE FACTORS FREQUENCY  PT (By licensed PT), OT (By licensed OT)     PT Frequency: 5x's/week OT Frequency: 5x's/week            Contractures Contractures Info: Not present    Additional Factors Info  Code Status, Allergies, Psychotropic Code Status Info: Full Allergies Info: Sulfa Antibiotics, Coreg (Carvedilol), Motrin (Ibuprofen), Toprol Xl (Metoprolol Tartrate), Doxycycline Hyclate, Flovent Hfa (Fluticasone), Statins Psychotropic Info: Ativan         Current Medications (04/21/2022):  This is the current hospital active medication list Current Facility-Administered Medications  Medication Dose Route Frequency Provider Last Rate Last Admin   acetaminophen (TYLENOL) tablet 650 mg  650 mg Oral Q6H PRN Emeterio Reeve,  DO       Or   acetaminophen (TYLENOL) suppository 650 mg  650 mg Rectal Q6H PRN Emeterio Reeve, DO       cefTRIAXone (ROCEPHIN) 2 g in sodium chloride 0.9 % 100 mL IVPB  2 g Intravenous Q24H Emeterio Reeve, DO       enoxaparin (LOVENOX) injection 40 mg  40 mg  Subcutaneous Q24H Emeterio Reeve, DO   40 mg at 97/67/34 1937   folic acid (FOLVITE) tablet 1 mg  1 mg Oral Daily Emeterio Reeve, DO   1 mg at 04/21/22 9024   LORazepam (ATIVAN) tablet 1-4 mg  1-4 mg Oral Q1H PRN Emeterio Reeve, DO       multivitamin with minerals tablet 1 tablet  1 tablet Oral Daily Emeterio Reeve, DO   1 tablet at 04/21/22 0919   ondansetron [facility]) injection 4 mg  4 mg Intravenous Q6H PRN Pokhrel, Laxman, MD       oxyCODONE (Oxy IR/ROXICODONE) immediate release tablet 5 mg  5 mg Oral Q4H PRN Emeterio Reeve, DO   5 mg at 04/21/22 0973   thiamine (VITAMIN B1) tablet 100 mg  100 mg Oral Daily Emeterio Reeve, DO   100 mg at 04/21/22 5329   Or   thiamine (VITAMIN B1) injection 100 mg  100 mg Intravenous Daily Emeterio Reeve, DO         Discharge Medications: Please see discharge summary for a list of discharge medications.  Relevant Imaging Results:  Relevant Lab Results:   Additional Information SSN: [id number]  Sherie Don, LCSW

## 2022-04-21 NOTE — Progress Notes (Signed)
Mobility Specialist - Progress Note   04/21/22 1513  Mobility  Activity Ambulated with assistance in hallway  Level of Assistance Contact guard assist, steadying assist  Assistive Device Front wheel walker  Distance Ambulated (ft) 80 ft  Activity Response Tolerated well  Mobility Referral Yes  $Mobility charge 1 Mobility   Pt received in bed and agreeable to mobility. C/o leg pain when getting back in the bed. No other complaints during session. Pt is MinA from sit-to-stand & contact during ambulation. Pt to bed after session with all needs met & nurse in room.   [facility]

## 2022-04-22 DIAGNOSIS — Z91148 Patient's other noncompliance with medication regimen for other reason: Secondary | ICD-10-CM | POA: Diagnosis not present

## 2022-04-22 DIAGNOSIS — I872 Venous insufficiency (chronic) (peripheral): Secondary | ICD-10-CM | POA: Diagnosis present

## 2022-04-22 DIAGNOSIS — Z886 Allergy status to analgesic agent status: Secondary | ICD-10-CM | POA: Diagnosis not present

## 2022-04-22 DIAGNOSIS — L03115 Cellulitis of right lower limb: Secondary | ICD-10-CM | POA: Diagnosis present

## 2022-04-22 DIAGNOSIS — F1020 Alcohol dependence, uncomplicated: Secondary | ICD-10-CM | POA: Diagnosis not present

## 2022-04-22 DIAGNOSIS — Z881 Allergy status to other antibiotic agents status: Secondary | ICD-10-CM | POA: Diagnosis not present

## 2022-04-22 DIAGNOSIS — R5381 Other malaise: Secondary | ICD-10-CM | POA: Diagnosis present

## 2022-04-22 DIAGNOSIS — I10 Essential (primary) hypertension: Secondary | ICD-10-CM | POA: Diagnosis present

## 2022-04-22 DIAGNOSIS — M1990 Unspecified osteoarthritis, unspecified site: Secondary | ICD-10-CM | POA: Diagnosis present

## 2022-04-22 DIAGNOSIS — E876 Hypokalemia: Secondary | ICD-10-CM | POA: Diagnosis present

## 2022-04-22 DIAGNOSIS — Z86711 Personal history of pulmonary embolism: Secondary | ICD-10-CM | POA: Diagnosis not present

## 2022-04-22 DIAGNOSIS — L03116 Cellulitis of left lower limb: Secondary | ICD-10-CM | POA: Diagnosis present

## 2022-04-22 DIAGNOSIS — Z888 Allergy status to other drugs, medicaments and biological substances status: Secondary | ICD-10-CM | POA: Diagnosis not present

## 2022-04-22 DIAGNOSIS — Z79899 Other long term (current) drug therapy: Secondary | ICD-10-CM | POA: Diagnosis not present

## 2022-04-22 DIAGNOSIS — K219 Gastro-esophageal reflux disease without esophagitis: Secondary | ICD-10-CM | POA: Diagnosis present

## 2022-04-22 DIAGNOSIS — Z7901 Long term (current) use of anticoagulants: Secondary | ICD-10-CM | POA: Diagnosis not present

## 2022-04-22 DIAGNOSIS — I890 Lymphedema, not elsewhere classified: Secondary | ICD-10-CM | POA: Diagnosis present

## 2022-04-22 DIAGNOSIS — M7989 Other specified soft tissue disorders: Secondary | ICD-10-CM | POA: Diagnosis present

## 2022-04-22 DIAGNOSIS — Z634 Disappearance and death of family member: Secondary | ICD-10-CM | POA: Diagnosis not present

## 2022-04-22 DIAGNOSIS — Z91199 Patient's noncompliance with other medical treatment and regimen due to unspecified reason: Secondary | ICD-10-CM | POA: Diagnosis not present

## 2022-04-22 DIAGNOSIS — F3289 Other specified depressive episodes: Secondary | ICD-10-CM | POA: Diagnosis not present

## 2022-04-22 DIAGNOSIS — Z86718 Personal history of other venous thrombosis and embolism: Secondary | ICD-10-CM | POA: Diagnosis not present

## 2022-04-22 DIAGNOSIS — Z882 Allergy status to sulfonamides status: Secondary | ICD-10-CM | POA: Diagnosis not present

## 2022-04-22 DIAGNOSIS — F32A Depression, unspecified: Secondary | ICD-10-CM | POA: Diagnosis present

## 2022-04-22 DIAGNOSIS — L03119 Cellulitis of unspecified part of limb: Secondary | ICD-10-CM | POA: Diagnosis not present

## 2022-04-22 DIAGNOSIS — Z85828 Personal history of other malignant neoplasm of skin: Secondary | ICD-10-CM | POA: Diagnosis not present

## 2022-04-22 DIAGNOSIS — G894 Chronic pain syndrome: Secondary | ICD-10-CM | POA: Diagnosis present

## 2022-04-22 LAB — CBC
HCT: 40.5 % (ref 36.0–46.0)
Hemoglobin: 12.9 g/dL (ref 12.0–15.0)
MCH: 28 pg (ref 26.0–34.0)
MCHC: 31.9 g/dL (ref 30.0–36.0)
MCV: 87.9 fL (ref 80.0–100.0)
Platelets: 181 10*3/uL (ref 150–400)
RBC: 4.61 MIL/uL (ref 3.87–5.11)
RDW: 19.9 % — ABNORMAL HIGH (ref 11.5–15.5)
WBC: 6.1 10*3/uL (ref 4.0–10.5)
nRBC: 0 % (ref 0.0–0.2)

## 2022-04-22 LAB — BASIC METABOLIC PANEL
Anion gap: 9 (ref 5–15)
BUN: 19 mg/dL (ref 8–23)
CO2: 30 mmol/L (ref 22–32)
Calcium: 8.3 mg/dL — ABNORMAL LOW (ref 8.9–10.3)
Chloride: 98 mmol/L (ref 98–111)
Creatinine, Ser: 0.71 mg/dL (ref 0.44–1.00)
GFR, Estimated: >60 mL/min (ref >60)
Glucose, Bld: 117 mg/dL — ABNORMAL HIGH (ref 70–99)
Potassium: 2.9 mmol/L — ABNORMAL LOW (ref 3.5–5.1)
Sodium: 137 mmol/L (ref 135–145)

## 2022-04-22 LAB — MAGNESIUM: Magnesium: 2.2 mg/dL (ref 1.7–2.4)

## 2022-04-22 MED ORDER — POTASSIUM CHLORIDE 20 MEQ PO PACK
40.0000 meq | PACK | Freq: Two times a day (BID) | ORAL | Status: AC
  Administered 2022-04-22 (×2): 40 meq via ORAL
  Filled 2022-04-22 (×2): qty 2

## 2022-04-22 NOTE — TOC Progression Note (Signed)
Transition of Care [facility]) - Progression Note   Patient Details  Name: Denise Kelly MRN: [id number] Date of Birth: 03/25/1940  Transition of Care [facility]) CM/SW [place], LCSW Phone Number: 04/22/2022, 2:53 PM  Clinical Narrative: CSW met with patient to review bed offers and patient is aware [facility] did not make a bed offer. Patient now prefers to discharge home with HHPT/OT. Patient has used [facility] before, but would like to use another agency in-network with her insurance. Patient is agreeable to referral to [facility]. CSW made [facility] referral to [address] [facility] with [facility], which was accepted. HH orders have been placed. CSW updated patient.  Expected Discharge Plan: [place] Barriers to Discharge: Continued Medical Work up  Expected Discharge Plan and Services In-house Referral: Clinical Social Work Discharge Planning Services: NA Post Acute Care Choice: Home Health Living arrangements for the past 2 months: Apartment             DME Arranged: N/A DME Agency: NA HH Arranged: PT, OT [place] Agency: [place] ([facility]) Date HH Agency Contacted: 04/22/22 Representative spoke with at [place]: [place] ([place]) Interventions SDOH Screenings   Food Insecurity: Food Insecurity Present (04/21/2022)  Housing: Low Risk  (04/21/2022)  Transportation Needs: Unmet Transportation Needs (04/21/2022)  Utilities: Not At Risk (04/21/2022)  Depression (PHQ2-9): Low Risk  (01/09/2019)  Tobacco Use: Medium Risk (04/20/2022)   Readmission Risk Interventions     No data to display

## 2022-04-22 NOTE — Progress Notes (Signed)
Mobility Specialist - Progress Note   04/22/22 1552  Mobility  Activity Ambulated with assistance in room  Level of Assistance Contact guard assist, steadying assist  Assistive Device Front wheel walker  Distance Ambulated (ft) 5 ft  Activity Response Tolerated well  Mobility Referral Yes  $Mobility charge 1 Mobility   Pt received in bed and agreeable to transfer to recliner. No complaints during session. Pt to recliner after session with all needs met & NT in room.   [facility]

## 2022-04-22 NOTE — Progress Notes (Addendum)
PROGRESS NOTE    Denise Kelly  TTS:[id number] DOB: March 09, 1941 DOA: 04/20/2022 PCP: [name], MD    Brief Narrative:  Denise Kelly is a 82 y.o. female with past medical history of chronic pain syndrome, and depression, alcohol dependence, lymphedema, GERD, hypertension, presented to the  hospital with lower extremity swelling and pain.  Patient is a poor historian with hard of hearing.  Patient was recently seen in the ED for similar complaints and was given antibiotics and Lasix with her PCP follow-up but states that her swelling and pain returned after stopping the antibiotic.  Patient does have a history of lymphedema and used to get therapy in the past and had followed up with vascular surgery.  History of DVT PE 2018 unprovoked and was on Eliquis for 6 months at that time.  In the ED, patient was treated with Rocephin, Lasix 20 mg and fentanyl and was admitted hospital for further evaluation and treatment.   Assessment and Plan:  Mild cellulitis with underlying lymphedema with some oozing. Ultrasound of the lower extremity was negative.  Continue Rocephin for now.  Seen by wound care during hospitalization and status post application of Unna boots.  Continue with IV diuresis.  Review of 2D echocardiogram from 12/2017 showed LV ejection fraction of 60 to 65% with grade 1 diastolic dysfunction.  Plans to change to IV diuretic to oral by a.m.  Significant hypokalemia.  Potassium of 2.9 today.  Will replace with oral potassium x 2.  Check levels in AM.  Magnesium level at 2.2.  Medical noncompliance Possibly due to cognitive dysfunction, hearing impaired, depression related.  Patient has not been followed up by PCP and had been fired due to several missed appointments.  TOC consult has been made for PCP and medication assistance.  At this time plan is for skilled nursing facility placement.   Alcohol dependence ([place]) No recent use of alcohol.  Will continue to monitor.   Depression/  Chronic pain syndrome Pain is under control at this time.    History of deep vein thrombosis (DVT) of lower extremity History of pulmonary embolus (PE) History PE and DVT 12/2017.  As per the last discharge, she was supposed to be on Eliquis for 6 months and indefinitely if sedentary lifestyle does not change.  Might benefit from Eliquis for DVT prevention  Debility weakness.  Physical therapy has seen the patient and recommended skilled nursing facility placement at this time.     DVT prophylaxis: enoxaparin (LOVENOX) injection 40 mg Start: 04/21/22 1000   Code Status:     Code Status: Full Code  Disposition: Skilled nursing facility as per PT OT evaluation.  Status is: Observation  The patient will require care spanning > 2 midnights and should be moved to inpatient because:  IV antibiotic, need for rehabilitation, IV diuresis.   Family Communication: None at bedside.  No contact number available.  Consultants:  None  Procedures:  None  Antimicrobials:  Rocephin IV  Anti-infectives (From admission, onward)    Start     Dose/Rate Route Frequency Ordered Stop   04/21/22 1500  cefTRIAXone (ROCEPHIN) 2 g in sodium chloride 0.9 % 100 mL IVPB        2 g 200 mL/hr over 30 Minutes Intravenous Every 24 hours 04/20/22 1800 04/28/22 1459   04/20/22 1430  cefTRIAXone (ROCEPHIN) 1 g in sodium chloride 0.9 % 100 mL IVPB        1 g 200 mL/hr over 30 Minutes Intravenous  Once 04/20/22 1424 04/20/22 1624       Subjective: Today, patient was seen and examined at bedside.  Patient states that she feels okay with mild pain in her legs.  Has been diuresing.  Not a good historian.  Objective: Vitals:   04/21/22 1408 04/21/22 1721 04/21/22 2039 04/22/22 0615  BP: 114/71 121/65 (!) 108/56 133/75  Pulse: 96 91 86 91  Resp: '14 14 17 18  '$ Temp: 97.8 F (36.6 C) (!) 97.5 F (36.4 C)  98.2 F (36.8 C)  TempSrc: Oral Oral  Oral  SpO2: 97% 94% 98% 91%  Weight:      Height:         Intake/Output Summary (Last 24 hours) at 04/22/2022 0736 Last data filed at 04/22/2022 0200 Gross per 24 hour  Intake 1541.45 ml  Output 1050 ml  Net 491.45 ml    Filed Weights   04/20/22 2358  Weight: 86.6 kg    Physical Examination: Body mass index is 29.9 kg/m.  General:  Average built, elderly female, alert awake and Communicative, not in obvious distress. HENT:   No scleral pallor or icterus noted. Oral mucosa is moist.  Chest:  Clear breath sounds.  Diminished breath sounds bilaterally. No crackles or wheezes.  CVS: S1 &S2 heard. No murmur.  Regular rate and rhythm. Abdomen: Soft, nontender, nondistended.  Bowel sounds are heard.   Extremities: Bilateral lower extremity on Unna boots. Psych: Alert, awake and oriented to place and person.  Communicative. CNS:  No cranial nerve deficits.  Power equal in all extremities.  Generalized weakness noted. Skin: Warm and dry, bilateral lower extremity with Unna boots.  Data Reviewed:   CBC: Recent Labs  Lab 04/20/22 1302 04/22/22 0414  WBC 5.8 6.1  NEUTROABS 3.0  --   HGB 13.4 12.9  HCT 41.7 40.5  MCV 87.8 87.9  PLT 196 181     Basic Metabolic Panel: Recent Labs  Lab 04/20/22 1302 04/21/22 0402 04/22/22 0414  NA 140 137 137  K 4.3 3.7 2.9*  CL 104 99 98  CO2 '29 28 30  '$ GLUCOSE 101* 114* 117*  BUN '11 18 19  '$ CREATININE 0.78 0.82 0.71  CALCIUM 8.1* 8.3* 8.3*  MG  --   --  2.2     Liver Function Tests: No results for input(s): "AST", "ALT", "ALKPHOS", "BILITOT", "PROT", "ALBUMIN" in the last 168 hours.   Radiology Studies: VAS [place] LOWER EXTREMITY VENOUS (DVT) (ONLY [facility] & [facility])  Result Date: 04/21/2022  Lower Venous DVT Study Patient Name:  Denise Kelly  Date of Exam:   04/20/2022 Medical Rec #: [id number]        Accession #:    [id number] Date of Birth: 1940/06/06        Patient Gender: F Patient Age:   71 years Exam Location:  [facility] Procedure:      VAS [place] LOWER EXTREMITY VENOUS (DVT) Referring  Phys: JULIE HAVILAND --------------------------------------------------------------------------------  Indications: Pain, swelling, redness in the setting of cellulitis and lymphedema. Other Indications: History of DVT/PE. Patient previously on Eliquis. Limitations: Patient intolerant to moderate probe pressure, highly intolerant to compressions, constant patient movement, tissue properties. Comparison Study: 01-14-2021 Prior bilateral lower extremity venous study was                   limited due to patient intolerance to compression, but                   negative for  DVT. Performing Technologist: Darlin Coco RDMS, RVT  Examination Guidelines: A complete evaluation includes B-mode imaging, spectral Doppler, color Doppler, and power Doppler as needed of all accessible portions of each vessel. Bilateral testing is considered an integral part of a complete examination. Limited examinations for reoccurring indications may be performed as noted. The reflux portion of the exam is performed with the patient in reverse Trendelenburg.  +---------+---------------+---------+-----------+----------+-------------------+ RIGHT    CompressibilityPhasicitySpontaneityPropertiesThrombus Aging      +---------+---------------+---------+-----------+----------+-------------------+ CFV      Full           Yes      Yes                                      +---------+---------------+---------+-----------+----------+-------------------+ SFJ      Full                                                             +---------+---------------+---------+-----------+----------+-------------------+ FV Prox  Full                                                             +---------+---------------+---------+-----------+----------+-------------------+ FV Mid   Full                                                             +---------+---------------+---------+-----------+----------+-------------------+ FV  DistalFull                                                             +---------+---------------+---------+-----------+----------+-------------------+ PFV      Full                                                             +---------+---------------+---------+-----------+----------+-------------------+ POP                     Yes      Yes                  Unable to tolerate                                                        compression         +---------+---------------+---------+-----------+----------+-------------------+ PTV  Yes      Yes                  Patent by color-                                                          unable to tolerate                                                        compression         +---------+---------------+---------+-----------+----------+-------------------+ PERO                    Yes      Yes                  Patent by color-                                                          unable to tolerate                                                        compression         +---------+---------------+---------+-----------+----------+-------------------+   +---------+---------------+---------+-----------+----------+-------------------+ LEFT     CompressibilityPhasicitySpontaneityPropertiesThrombus Aging      +---------+---------------+---------+-----------+----------+-------------------+ CFV      Full           Yes      Yes                                      +---------+---------------+---------+-----------+----------+-------------------+ SFJ      Full                                                             +---------+---------------+---------+-----------+----------+-------------------+ FV Prox  Full                                                             +---------+---------------+---------+-----------+----------+-------------------+ FV Mid    Full                                                             +---------+---------------+---------+-----------+----------+-------------------+  FV Distal               Yes      Yes                  Patent by color-                                                          unable to tolerate                                                        compression         +---------+---------------+---------+-----------+----------+-------------------+ PFV      Full                                                             +---------+---------------+---------+-----------+----------+-------------------+ POP                     Yes      Yes                                      +---------+---------------+---------+-----------+----------+-------------------+ PTV                                                   Patent by color-                                                          unable to tolerate                                                        compression         +---------+---------------+---------+-----------+----------+-------------------+ PERO                                                  Not visualized-                                                           unable to contact  skin with probe                                                           without patient                                                           movement/pain       +---------+---------------+---------+-----------+----------+-------------------+     Summary: RIGHT: - There is no evidence of deep vein thrombosis in the lower extremity. However, portions of this examination were limited- see technologist comments above.  - No cystic structure found in the popliteal fossa.  LEFT: - There is no evidence of deep vein thrombosis in the lower extremity. However, portions of this  examination were limited- see technologist comments above.  - No cystic structure found in the popliteal fossa.  *See table(s) above for measurements and observations. Electronically signed by Servando Snare MD on 04/21/2022 at 2:40:15 PM.    Final       LOS: 0 days    Flora Lipps, MD [facility] Available via Epic secure chat 7am-7pm After these hours, please refer to coverage provider listed on [email or web address] 04/22/2022, 7:36 AM

## 2022-04-23 DIAGNOSIS — Z86718 Personal history of other venous thrombosis and embolism: Secondary | ICD-10-CM | POA: Diagnosis not present

## 2022-04-23 DIAGNOSIS — L03119 Cellulitis of unspecified part of limb: Secondary | ICD-10-CM | POA: Diagnosis not present

## 2022-04-23 DIAGNOSIS — F3289 Other specified depressive episodes: Secondary | ICD-10-CM | POA: Diagnosis not present

## 2022-04-23 DIAGNOSIS — G894 Chronic pain syndrome: Secondary | ICD-10-CM | POA: Diagnosis not present

## 2022-04-23 LAB — BASIC METABOLIC PANEL
Anion gap: 9 (ref 5–15)
BUN: 20 mg/dL (ref 8–23)
CO2: 30 mmol/L (ref 22–32)
Calcium: 8.6 mg/dL — ABNORMAL LOW (ref 8.9–10.3)
Chloride: 98 mmol/L (ref 98–111)
Creatinine, Ser: 0.74 mg/dL (ref 0.44–1.00)
GFR, Estimated: >60 mL/min (ref >60)
Glucose, Bld: 115 mg/dL — ABNORMAL HIGH (ref 70–99)
Potassium: 3.9 mmol/L (ref 3.5–5.1)
Sodium: 137 mmol/L (ref 135–145)

## 2022-04-23 LAB — CBC
HCT: 41.8 % (ref 36.0–46.0)
Hemoglobin: 13.4 g/dL (ref 12.0–15.0)
MCH: 28.3 pg (ref 26.0–34.0)
MCHC: 32.1 g/dL (ref 30.0–36.0)
MCV: 88.4 fL (ref 80.0–100.0)
Platelets: 190 10*3/uL (ref 150–400)
RBC: 4.73 MIL/uL (ref 3.87–5.11)
RDW: 19.9 % — ABNORMAL HIGH (ref 11.5–15.5)
WBC: 6.3 10*3/uL (ref 4.0–10.5)
nRBC: 0 % (ref 0.0–0.2)

## 2022-04-23 LAB — MAGNESIUM: Magnesium: 1.9 mg/dL (ref 1.7–2.4)

## 2022-04-23 MED ORDER — FUROSEMIDE 40 MG PO TABS: 40.0000 mg | ORAL_TABLET | Freq: Every day | ORAL | 0 refills | Status: DC

## 2022-04-23 MED ORDER — ADULT MULTIVITAMIN W/MINERALS CH: 1.0000 | ORAL_TABLET | Freq: Every day | ORAL | 0 refills | Status: DC

## 2022-04-23 MED ORDER — CEPHALEXIN 500 MG PO CAPS: 500.0000 mg | ORAL_CAPSULE | Freq: Three times a day (TID) | ORAL | 0 refills | Status: AC

## 2022-04-23 MED ORDER — THIAMINE HCL 100 MG PO TABS: 100.0000 mg | ORAL_TABLET | Freq: Every day | ORAL | 0 refills | Status: AC

## 2022-04-23 MED ORDER — POLYVINYL ALCOHOL 1.4 % OP SOLN
1.0000 [drp] | OPHTHALMIC | Status: DC | PRN
  Administered 2022-04-23: 1 [drp] via OPHTHALMIC
  Filled 2022-04-23: qty 15

## 2022-04-23 MED ORDER — FOLIC ACID 1 MG PO TABS: 1.0000 mg | ORAL_TABLET | Freq: Every day | ORAL | 0 refills | Status: AC

## 2022-04-23 NOTE — TOC Transition Note (Signed)
Transition of Care [facility]) - CM/SW Discharge Note  Patient Details  Name: Denise Kelly MRN: [id number] Date of Birth: 1940-05-02  [facility] CM/SW Contact:  Sherie Don, LCSW Phone Number: 04/23/2022, 11:27 AM  Clinical Narrative: Housing authority information added to AVS. TOC signing off.    Final next level of care: Home w Home Health Services Barriers to Discharge: Barriers Resolved  Patient Goals and CMS Choice CMS Medicare.gov Compare Post Acute Care list provided to:: Patient Choice offered to / list presented to : Patient  Discharge Plan and Services Additional resources added to the After Visit Summary for   In-house Referral: Clinical Social Work Discharge Planning Services: NA Post Acute Care Choice: Home Health          DME Arranged: N/A DME Agency: NA HH Arranged: PT, OT [place] Agency: [place] ([facility]) Date HH Agency Contacted: 04/22/22 Representative spoke with at [place]: [place] ([place]) Interventions SDOH Screenings   Food Insecurity: Food Insecurity Present (04/21/2022)  Housing: Low Risk  (04/21/2022)  Transportation Needs: Unmet Transportation Needs (04/21/2022)  Utilities: Not At Risk (04/21/2022)  Depression (PHQ2-9): Low Risk  (01/09/2019)  Tobacco Use: Medium Risk (04/20/2022)   Readmission Risk Interventions     No data to display

## 2022-04-23 NOTE — Progress Notes (Signed)
Physical Therapy Treatment Patient Details Name: Denise Kelly MRN: [id number] DOB: 1940/11/03 Today's Date: 04/23/2022   History of Present Illness Patient is a 82 year old female who presented with AMS urinary frequency, fever, and BLE edema. Patient was found to have UTI, SIRS, acute metabolic encephalopathy. PMH: PE, alcohol dependence, depression, chronic pain syndrome, medical noncompliance, R TKA, L TKA, lumbar fusion.    PT Comments    Progressing with mobility. Pt appears to be fearful of falling and stated "don't let go of that strap." She tolerated session fairly well. She reports she is expecting a visit from a representative from [employer].? Will continue to follow and progress activity as tolerated.     Recommendations for follow up therapy are one component of a multi-disciplinary discharge planning process, led by the attending physician.  Recommendations may be updated based on patient status, additional functional criteria and insurance authorization.  Follow Up Recommendations  Skilled nursing-short term rehab (<3 hours/day) Can patient physically be transported by private vehicle: No   Assistance Recommended at Discharge Intermittent Supervision/Assistance  Patient can return home with the following A little help with walking and/or transfers;A little help with bathing/dressing/bathroom;Assistance with cooking/housework;Assist for transportation;Help with stairs or ramp for entrance   Equipment Recommendations  None recommended by PT    Recommendations for Other Services       Precautions / Restrictions Precautions Precautions: Fall Precaution Comments: back precautions Restrictions Weight Bearing Restrictions: No     Mobility  Bed Mobility               General bed mobility comments: oob in recliner    Transfers Overall transfer level: Needs assistance Equipment used: Rolling walker (2 wheels) Transfers: Sit to/from Stand Sit to Stand: Min  assist           General transfer comment: Pt fearful of falling and stated "don't let go off that strap.". Cues for safety, technique, hand placement. Increased time. Assist to power up, stabilize.    Ambulation/Gait Ambulation/Gait assistance: Min assist Gait Distance (Feet): 65 Feet Assistive device: Rolling walker (2 wheels) Gait Pattern/deviations: Step-through pattern, Decreased stride length       General Gait Details: Intermittent assist to steady pt throughout distance. Cues for safety. Pt intially requested to ambulate further but then changed her mind and requested to return to the room.   Stairs             Wheelchair Mobility    Modified Rankin (Stroke Patients Only)       Balance Overall balance assessment: Needs assistance         Standing balance support: Reliant on assistive device for balance, During functional activity Standing balance-Leahy Scale: Poor                              Cognition Arousal/Alertness: Awake/alert Behavior During Therapy: WFL for tasks assessed/performed                                   General Comments: Patient reported that she was texting Risk analyst (famous Firefighter) and donald trump on her phone.        Exercises      General Comments        Pertinent Vitals/Pain Pain Assessment Pain Assessment: Faces Faces Pain Scale: Hurts little more Pain Location: BLE Pain Descriptors / Indicators:  Grimacing, Discomfort, Sore Pain Intervention(s): Limited activity within patient's tolerance, Monitored during session, Repositioned    Home Living                          Prior Function            PT Goals (current goals can now be found in the care plan section) Progress towards PT goals: Progressing toward goals    Frequency    Min 2X/week      PT Plan Current plan remains appropriate    Co-evaluation              AM-PAC PT "6 Clicks"  Mobility   Outcome Measure  Help needed turning from your back to your side while in a flat bed without using bedrails?: A Little Help needed moving from lying on your back to sitting on the side of a flat bed without using bedrails?: A Little Help needed moving to and from a bed to a chair (including a wheelchair)?: A Little Help needed standing up from a chair using your arms (e.g., wheelchair or bedside chair)?: A Little Help needed to walk in hospital room?: A Little Help needed climbing 3-5 steps with a railing? : A Lot 6 Click Score: 17    End of Session Equipment Utilized During Treatment: Gait belt Activity Tolerance: Patient tolerated treatment well;Patient limited by fatigue Patient left: in chair;with call bell/phone within reach;with chair alarm set   PT Visit Diagnosis: Other abnormalities of gait and mobility (R26.89);Difficulty in walking, not elsewhere classified (R26.2);Muscle weakness (generalized) (M62.81)     Time: 7711-6579 PT Time Calculation (min) (ACUTE ONLY): 20 min  Charges:  $Gait Training: 8-22 mins                         Doreatha Massed, PT Acute Rehabilitation  Office: [phone]

## 2022-04-23 NOTE — Plan of Care (Signed)
Patient is stable for discharge. Discharge instructions have been given. All questions were answered, patient is discharged home with family friend.

## 2022-04-23 NOTE — Progress Notes (Signed)
Occupational Therapy Treatment Patient Details Name: Denise Kelly MRN: [id number] DOB: 01-07-1941 Today's Date: 04/23/2022   History of present illness Patient is a 82 year old female who presented with AMS urinary frequency, fever, and BLE edema. Patient was found to have UTI, SIRS, acute metabolic encephalopathy. PMH: PE, alcohol dependence, depression, chronic pain syndrome, medical noncompliance, R TKA, L TKA, lumbar fusion.   OT comments  Patient is making progress towards goals. Patient did not have any nausea during transition from bed to chair and engaging in LB dressing tasks. Patient was educated on concerns on transitioning home with need for A with medication management and ADL tasks. Patient verbalized understanding. Patient to discuss options with friend Hope. Patient's discharge plan remains appropriate at this time. OT will continue to follow acutely.     Recommendations for follow up therapy are one component of a multi-disciplinary discharge planning process, led by the attending physician.  Recommendations may be updated based on patient status, additional functional criteria and insurance authorization.    Follow Up Recommendations  Skilled nursing-short term rehab (<3 hours/day)     Assistance Recommended at Discharge Frequent or constant Supervision/Assistance  Patient can return home with the following  A little help with walking and/or transfers;A lot of help with bathing/dressing/bathroom;Assistance with cooking/housework;Direct supervision/assist for medications management;Assist for transportation;Help with stairs or ramp for entrance;Direct supervision/assist for financial management   Equipment Recommendations  None recommended by OT       Precautions / Restrictions Precautions Precautions: Fall Precaution Comments: back precautions Restrictions Weight Bearing Restrictions: No       Mobility Bed Mobility Overal bed mobility: Needs Assistance Bed  Mobility: Supine to Sit     Supine to sit: Min assist          Transfers Overall transfer level: Needs assistance Equipment used: Rolling walker (2 wheels) Transfers: Sit to/from Stand, Bed to chair/wheelchair/BSC Sit to Stand: Min assist     Step pivot transfers: Min assist           Balance Overall balance assessment: Needs assistance         Standing balance support: Reliant on assistive device for balance, During functional activity Standing balance-Leahy Scale: Poor           ADL either performed or assessed with clinical judgement   ADL Overall ADL's : Needs assistance/impaired                     Lower Body Dressing: Moderate assistance Lower Body Dressing Details (indicate cue type and reason): with consistent incontinence of urine to doff mesh underwear and wash up with dribbling noted                      Cognition Arousal/Alertness: Awake/alert Behavior During Therapy: WFL for tasks assessed/performed Overall Cognitive Status: Difficult to assess       General Comments: Patient reported that she was texting Risk analyst Secretary/administrator) on her phone. patient reported that he sends her money some times. patient reported that she does not send him money because he is famous. nurse made aware. patient was educated on staying safe on the internet and avoiding scammers. patient reported she does that already.                   Pertinent Vitals/ Pain       Pain Assessment Pain Assessment: Faces Faces Pain Scale: Hurts little more Pain Location: BLE Pain Descriptors /  Indicators: Grimacing, Discomfort Pain Intervention(s): Limited activity within patient's tolerance, Premedicated before session, Repositioned         Frequency  Min 2X/week        Progress Toward Goals  OT Goals(current goals can now be found in the care plan section)  Progress towards OT goals: Progressing toward goals     Plan Discharge plan  remains appropriate       AM-PAC OT "6 Clicks" Daily Activity     Outcome Measure   Help from another person eating meals?: A Little Help from another person taking care of personal grooming?: A Little Help from another person toileting, which includes using toliet, bedpan, or urinal?: A Lot Help from another person bathing (including washing, rinsing, drying)?: A Lot Help from another person to put on and taking off regular upper body clothing?: A Little Help from another person to put on and taking off regular lower body clothing?: A Lot 6 Click Score: 15    End of Session Equipment Utilized During Treatment: Rolling walker (2 wheels);Gait belt  OT Visit Diagnosis: Unsteadiness on feet (R26.81);Other abnormalities of gait and mobility (R26.89);Muscle weakness (generalized) (M62.81);Pain   Activity Tolerance Patient tolerated treatment well   Patient Left in chair;with call bell/phone within reach;with chair alarm set   Nurse Communication Other (comment) (concerns over patients d/c location and contact with "tennis player")        Time: 7847-8412 OT Time Calculation (min): 26 min  Charges: OT General Charges $OT Visit: 1 Visit OT Treatments $Self Care/Home Management : 23-37 mins  Rennie Plowman, MS Acute Rehabilitation Department Office# [phone]   Willa Rough 04/23/2022, 11:11 AM

## 2022-04-23 NOTE — Discharge Summary (Signed)
Physician Discharge Summary  Denise Kelly TDS:[id number] DOB: 18-Dec-1940 DOA: 04/20/2022  PCP: [name], MD  Admit date: 04/20/2022 Discharge date: 04/23/2022  Admitted From: Home  Discharge disposition: Home with home health   Recommendations for Outpatient Follow-Up:   Follow up with your primary care provider in one week.  Check CBC, BMP, magnesium in the next visit Patient would benefit from  [facility] boot application as outpatient.   Discharge Diagnosis:   Principal Problem:   Cellulitis Active Problems:   Alcohol dependence (HCC)   Depression   Chronic pain syndrome   Lymphedema   History of deep vein thrombosis (DVT) of lower extremity   History of pulmonary embolus (PE)   Discharge Condition: Improved.  Diet recommendation: Regular.  Wound care: None.  Code status: Full.   History of Present Illness:   Denise Kelly is a 82 y.o. female with past medical history of chronic pain syndrome, and depression, alcohol dependence, lymphedema, GERD, hypertension, presented to the  hospital with lower extremity swelling and pain. Patient was recently seen in the ED for similar complaints and was given antibiotics and Lasix with her PCP follow-up but states that her swelling and pain returned after stopping the antibiotic.  Patient does have a history of lymphedema and used to get therapy in the past and had followed up with vascular surgery.  History of DVT PE 2018 unprovoked and was on Eliquis for 6 months at that time.  In the ED, patient was treated with Rocephin, Lasix 20 mg and fentanyl and was admitted hospital for further evaluation and treatment.    Hospital Course:   Following conditions were addressed during hospitalization as listed below,  Mild cellulitis with underlying lymphedema with some oozing. Duplex ultrasound of the lower extremity was negative.  Patient received IV Rocephin during hospitalization and had application of Unna boots with improvement  in her leg swelling and edema.  Patient also received IV diuresis which will be changed to oral on discharge.   Review of 2D echocardiogram from 12/2017 showed LV ejection fraction of 60 to 65% with grade 1 diastolic dysfunction.    Hypokalemia.  Improved after replacement.  Potassium prior to discharge was 3.9.  Medical noncompliance Possibly due to cognitive dysfunction, hearing impaired, depression related.  Patient has not been followed up by PCP and had been fired due to several missed appointments.  Patient wishes to go home at this time.  Encouraged to follow-up with her primary care patient.  Will prescribe medications as needed on discharge.   Alcohol dependence ([place]) No recent use of alcohol.  Will continue to monitor.  Did not have any withdrawal symptoms during hospitalization.  Will continue with multivitamin, thiamine folic acid on discharge.   Depression/ Chronic pain syndrome Pain is under control at this time.  Patient will continue trazodone, pramipexole from home.   History of deep vein thrombosis (DVT) of lower extremity History of pulmonary embolus (PE) History PE and DVT 12/2017.  As per the last discharge, she was supposed to be on Eliquis for 6 months and indefinitely if sedentary lifestyle does not change.  Will continue Eliquis on discharge.  Debility weakness.  Physical therapy had seen the patient and recommended skilled nursing facility but despite multiple conversation with the patient by the transitional care and myself she wishes not to go to skilled nursing facility.  Disposition.  At this time, patient is stable for disposition home with home health services.  Medical Consultants:   None.  Procedures:    None Subjective:   Today, patient was seen and examined at bedside.  Denies any nausea, vomiting fever chills or rigor.  Wishes to go home.  Did not wish to go to skilled nursing facility.  Discharge Exam:   Vitals:   04/22/22 2012 04/23/22 0439   BP: 128/75 125/72  Pulse: 89 88  Resp: 20 20  Temp: 97.6 F (36.4 C) 97.6 F (36.4 C)  SpO2: 95% 97%   Vitals:   04/22/22 1152 04/22/22 2011 04/22/22 2012 04/23/22 0439  BP: 106/68 128/75 128/75 125/72  Pulse: 91 83 89 88  Resp:   20 20  Temp: 98.2 F (36.8 C)  97.6 F (36.4 C) 97.6 F (36.4 C)  TempSrc: Oral  Oral   SpO2: 94% 96% 95% 97%  Weight:      Height:       General: Alert awake, not in obvious distress, Communicative, elderly female, HENT: pupils equally reacting to light,  No scleral pallor or icterus noted. Oral mucosa is moist.  Chest:   Diminished breath sounds bilaterally. No crackles or wheezes.  CVS: S1 &S2 heard. No murmur.  Regular rate and rhythm. Abdomen: Soft, nontender, nondistended.  Bowel sounds are heard.   Extremities: No cyanosis, clubbing with bilateral lower extremity Unna boots  Psych: Alert, awake and oriented  CNS:  No cranial nerve deficits.  Power equal in all extremities.  Generalized weakness noted. Skin: Warm and dry.  Bilateral lower extremity Unna boots  The results of significant diagnostics from this hospitalization (including imaging, microbiology, ancillary and laboratory) are listed below for reference.     Diagnostic Studies:   VAS [place] LOWER EXTREMITY VENOUS (DVT) (ONLY [facility] & [facility])  Result Date: 04/21/2022  Lower Venous DVT Study Patient Name:  Denise Kelly  Date of Exam:   04/20/2022 Medical Rec #: [id number]        Accession #:    [id number] Date of Birth: 05-11-1940        Patient Gender: F Patient Age:   59 years Exam Location:  [facility] Procedure:      VAS [place] LOWER EXTREMITY VENOUS (DVT) Referring Phys: JULIE HAVILAND --------------------------------------------------------------------------------  Indications: Pain, swelling, redness in the setting of cellulitis and lymphedema. Other Indications: History of DVT/PE. Patient previously on Eliquis. Limitations: Patient intolerant to moderate probe pressure, highly  intolerant to compressions, constant patient movement, tissue properties. Comparison Study: 01-14-2021 Prior bilateral lower extremity venous study was                   limited due to patient intolerance to compression, but                   negative for DVT. Performing Technologist: Darlin Coco RDMS, RVT  Examination Guidelines: A complete evaluation includes B-mode imaging, spectral Doppler, color Doppler, and power Doppler as needed of all accessible portions of each vessel. Bilateral testing is considered an integral part of a complete examination. Limited examinations for reoccurring indications may be performed as noted. The reflux portion of the exam is performed with the patient in reverse Trendelenburg.  +---------+---------------+---------+-----------+----------+-------------------+ RIGHT    CompressibilityPhasicitySpontaneityPropertiesThrombus Aging      +---------+---------------+---------+-----------+----------+-------------------+ CFV      Full           Yes      Yes                                      +---------+---------------+---------+-----------+----------+-------------------+  SFJ      Full                                                             +---------+---------------+---------+-----------+----------+-------------------+ FV Prox  Full                                                             +---------+---------------+---------+-----------+----------+-------------------+ FV Mid   Full                                                             +---------+---------------+---------+-----------+----------+-------------------+ FV DistalFull                                                             +---------+---------------+---------+-----------+----------+-------------------+ PFV      Full                                                             +---------+---------------+---------+-----------+----------+-------------------+ POP                      Yes      Yes                  Unable to tolerate                                                        compression         +---------+---------------+---------+-----------+----------+-------------------+ PTV                     Yes      Yes                  Patent by color-                                                          unable to tolerate  compression         +---------+---------------+---------+-----------+----------+-------------------+ PERO                    Yes      Yes                  Patent by color-                                                          unable to tolerate                                                        compression         +---------+---------------+---------+-----------+----------+-------------------+   +---------+---------------+---------+-----------+----------+-------------------+ LEFT     CompressibilityPhasicitySpontaneityPropertiesThrombus Aging      +---------+---------------+---------+-----------+----------+-------------------+ CFV      Full           Yes      Yes                                      +---------+---------------+---------+-----------+----------+-------------------+ SFJ      Full                                                             +---------+---------------+---------+-----------+----------+-------------------+ FV Prox  Full                                                             +---------+---------------+---------+-----------+----------+-------------------+ FV Mid   Full                                                             +---------+---------------+---------+-----------+----------+-------------------+ FV Distal               Yes      Yes                  Patent by color-                                                          unable to tolerate  compression         +---------+---------------+---------+-----------+----------+-------------------+ PFV      Full                                                             +---------+---------------+---------+-----------+----------+-------------------+ POP                     Yes      Yes                                      +---------+---------------+---------+-----------+----------+-------------------+ PTV                                                   Patent by color-                                                          unable to tolerate                                                        compression         +---------+---------------+---------+-----------+----------+-------------------+ PERO                                                  Not visualized-                                                           unable to contact                                                         skin with probe                                                           without patient  movement/pain       +---------+---------------+---------+-----------+----------+-------------------+     Summary: RIGHT: - There is no evidence of deep vein thrombosis in the lower extremity. However, portions of this examination were limited- see technologist comments above.  - No cystic structure found in the popliteal fossa.  LEFT: - There is no evidence of deep vein thrombosis in the lower extremity. However, portions of this examination were limited- see technologist comments above.  - No cystic structure found in the popliteal fossa.  *See table(s) above for measurements and observations. Electronically signed by Servando Snare MD on 04/21/2022 at 2:40:15 PM.    Final      Labs:   Basic Metabolic Panel: Recent Labs  Lab 04/20/22 1302  04/21/22 0402 04/22/22 0414 04/23/22 0443  NA 140 137 137 137  K 4.3 3.7 2.9* 3.9  CL 104 99 98 98  CO2 '29 28 30 30  '$ GLUCOSE 101* 114* 117* 115*  BUN '11 18 19 20  '$ CREATININE 0.78 0.82 0.71 0.74  CALCIUM 8.1* 8.3* 8.3* 8.6*  MG  --   --  2.2 1.9   GFR Estimated Creatinine Clearance: 63.4 mL/min (by C-G formula based on SCr of 0.74 mg/dL). Liver Function Tests: No results for input(s): "AST", "ALT", "ALKPHOS", "BILITOT", "PROT", "ALBUMIN" in the last 168 hours. No results for input(s): "LIPASE", "AMYLASE" in the last 168 hours. No results for input(s): "AMMONIA" in the last 168 hours. Coagulation profile No results for input(s): "INR", "PROTIME" in the last 168 hours.  CBC: Recent Labs  Lab 04/20/22 1302 04/22/22 0414 04/23/22 0443  WBC 5.8 6.1 6.3  NEUTROABS 3.0  --   --   HGB 13.4 12.9 13.4  HCT 41.7 40.5 41.8  MCV 87.8 87.9 88.4  PLT 196 181 190   Cardiac Enzymes: No results for input(s): "CKTOTAL", "CKMB", "CKMBINDEX", "TROPONINI" in the last 168 hours. BNP: Invalid input(s): "POCBNP" CBG: No results for input(s): "GLUCAP" in the last 168 hours. D-Dimer No results for input(s): "DDIMER" in the last 72 hours. Hgb A1c No results for input(s): "HGBA1C" in the last 72 hours. Lipid Profile No results for input(s): "CHOL", "HDL", "LDLCALC", "TRIG", "CHOLHDL", "LDLDIRECT" in the last 72 hours. Thyroid function studies No results for input(s): "TSH", "T4TOTAL", "T3FREE", "THYROIDAB" in the last 72 hours.  Invalid input(s): "FREET3" Anemia work up No results for input(s): "VITAMINB12", "FOLATE", "FERRITIN", "TIBC", "IRON", "RETICCTPCT" in the last 72 hours. Microbiology No results found for this or any previous visit (from the past 240 hour(s)).   Discharge Instructions:   Discharge Instructions     Call MD for:  temperature >100.4   Complete by: As directed    Diet - low sodium heart healthy   Complete by: As directed    Discharge instructions   Complete  by: As directed    Follow-up with your primary care provider in 1 week.  Check blood work at that time.  Take medications as prescribed.  Continue physical therapy at home.  Seek medical attention for worsening symptoms.   Increase activity slowly   Complete by: As directed       Allergies as of 04/23/2022       Reactions   Sulfa Antibiotics Hives, Swelling, Other (See Comments)   Reaction:  Facial/eye swelling    Coreg [carvedilol] Other (See Comments)   Memory loss   Motrin [ibuprofen] Palpitations   Toprol Xl [metoprolol Tartrate] Cough   Doxycycline Hyclate Other (See Comments)   HEARTBURN   Flovent Hfa [fluticasone] Itching  Statins Other (See Comments)   MENTAL STATUS CHANGE        Medication List     STOP taking these medications    hydrOXYzine 25 MG tablet Commonly known as: ATARAX   loperamide 2 MG capsule Commonly known as: IMODIUM   ondansetron 4 MG tablet Commonly known as: ZOFRAN   pantoprazole 40 MG tablet Commonly known as: Protonix       TAKE these medications    acetaminophen 500 MG tablet Commonly known as: TYLENOL Take 500 mg by mouth every 6 (six) hours as needed for moderate pain. What changed: Another medication with the same name was removed. Continue taking this medication, and follow the directions you see here.   cephALEXin 500 MG capsule Commonly known as: KEFLEX Take 1 capsule (500 mg total) by mouth 3 (three) times daily for 5 days.   Eliquis 5 MG Tabs tablet Generic drug: apixaban Take 5 mg by mouth 2 (two) times daily. What changed: Another medication with the same name was removed. Continue taking this medication, and follow the directions you see here.   folic acid 1 MG tablet Commonly known as: FOLVITE Take 1 tablet (1 mg total) by mouth daily.   furosemide 40 MG tablet Commonly known as: LASIX Take 1 tablet (40 mg total) by mouth daily.   multivitamin with minerals Tabs tablet Take 1 tablet by mouth daily.    ondansetron 4 MG disintegrating tablet Commonly known as: ZOFRAN-ODT Take 4 mg by mouth every 8 (eight) hours as needed for nausea or vomiting.   pramipexole 0.125 MG tablet Commonly known as: MIRAPEX Take 0.125 mg by mouth daily.   thiamine 100 MG tablet Commonly known as: VITAMIN B1 Take 1 tablet (100 mg total) by mouth daily.   traZODone 100 MG tablet Commonly known as: DESYREL Take 200 mg by mouth at bedtime as needed for sleep.        Follow-up [place]. Call.   Why: Call [facility] to be screened for transportation eligibility. Contact information: [phone].        [facility] Follow up.   Why: [facility] will provide PT and OT in the home after discharge.        [name], MD Follow up in 1 week(s).   Specialty: Family Medicine Contact information: [place] [address] [phone]         [employer]. Call.   Contact information: [address] [place], [place] [address] [phone]                 Time coordinating discharge: 39 minutes  Signed:  Caleb Prigmore  [facility] 04/23/2022, 10:25 AM

## 2022-04-28 ENCOUNTER — Observation Stay (HOSPITAL_COMMUNITY)
Admission: EM | Admit: 2022-04-28 | Discharge: 2022-05-02 | DRG: 556 | Disposition: A | Discharge status: Skilled Nursing Facility | Payer: Medicare Other | Attending: Internal Medicine | Admitting: Internal Medicine

## 2022-04-28 ENCOUNTER — Other Ambulatory Visit: Payer: Self-pay

## 2022-04-28 ENCOUNTER — Encounter (HOSPITAL_COMMUNITY): Payer: Self-pay | Admitting: Emergency Medicine

## 2022-04-28 DIAGNOSIS — Z87891 Personal history of nicotine dependence: Secondary | ICD-10-CM | POA: Diagnosis not present

## 2022-04-28 DIAGNOSIS — Z1152 Encounter for screening for COVID-19: Secondary | ICD-10-CM

## 2022-04-28 DIAGNOSIS — Z7901 Long term (current) use of anticoagulants: Secondary | ICD-10-CM | POA: Diagnosis not present

## 2022-04-28 DIAGNOSIS — Z20822 Contact with and (suspected) exposure to covid-19: Secondary | ICD-10-CM | POA: Diagnosis not present

## 2022-04-28 DIAGNOSIS — G894 Chronic pain syndrome: Secondary | ICD-10-CM | POA: Diagnosis not present

## 2022-04-28 DIAGNOSIS — K5909 Other constipation: Secondary | ICD-10-CM | POA: Diagnosis present

## 2022-04-28 DIAGNOSIS — R2681 Unsteadiness on feet: Secondary | ICD-10-CM | POA: Diagnosis not present

## 2022-04-28 DIAGNOSIS — M6281 Muscle weakness (generalized): Secondary | ICD-10-CM | POA: Diagnosis not present

## 2022-04-28 DIAGNOSIS — M160 Bilateral primary osteoarthritis of hip: Secondary | ICD-10-CM | POA: Diagnosis not present

## 2022-04-28 DIAGNOSIS — K219 Gastro-esophageal reflux disease without esophagitis: Secondary | ICD-10-CM | POA: Diagnosis not present

## 2022-04-28 DIAGNOSIS — R600 Localized edema: Principal | ICD-10-CM | POA: Insufficient documentation

## 2022-04-28 DIAGNOSIS — I5032 Chronic diastolic (congestive) heart failure: Secondary | ICD-10-CM | POA: Diagnosis present

## 2022-04-28 DIAGNOSIS — Z87440 Personal history of urinary (tract) infections: Secondary | ICD-10-CM

## 2022-04-28 DIAGNOSIS — Z886 Allergy status to analgesic agent status: Secondary | ICD-10-CM

## 2022-04-28 DIAGNOSIS — M25552 Pain in left hip: Secondary | ICD-10-CM | POA: Diagnosis present

## 2022-04-28 DIAGNOSIS — I110 Hypertensive heart disease with heart failure: Secondary | ICD-10-CM | POA: Diagnosis not present

## 2022-04-28 DIAGNOSIS — R627 Adult failure to thrive: Secondary | ICD-10-CM | POA: Diagnosis not present

## 2022-04-28 DIAGNOSIS — Q820 Hereditary lymphedema: Secondary | ICD-10-CM

## 2022-04-28 DIAGNOSIS — I878 Other specified disorders of veins: Secondary | ICD-10-CM | POA: Diagnosis present

## 2022-04-28 DIAGNOSIS — I480 Paroxysmal atrial fibrillation: Secondary | ICD-10-CM | POA: Diagnosis not present

## 2022-04-28 DIAGNOSIS — Z86711 Personal history of pulmonary embolism: Secondary | ICD-10-CM | POA: Diagnosis not present

## 2022-04-28 DIAGNOSIS — I872 Venous insufficiency (chronic) (peripheral): Secondary | ICD-10-CM | POA: Diagnosis present

## 2022-04-28 DIAGNOSIS — S72002A Fracture of unspecified part of neck of left femur, initial encounter for closed fracture: Secondary | ICD-10-CM | POA: Diagnosis not present

## 2022-04-28 DIAGNOSIS — Z79899 Other long term (current) drug therapy: Secondary | ICD-10-CM | POA: Diagnosis not present

## 2022-04-28 DIAGNOSIS — R531 Weakness: Secondary | ICD-10-CM | POA: Diagnosis not present

## 2022-04-28 DIAGNOSIS — Z8249 Family history of ischemic heart disease and other diseases of the circulatory system: Secondary | ICD-10-CM

## 2022-04-28 DIAGNOSIS — Z602 Problems related to living alone: Secondary | ICD-10-CM | POA: Diagnosis not present

## 2022-04-28 DIAGNOSIS — J189 Pneumonia, unspecified organism: Secondary | ICD-10-CM | POA: Diagnosis not present

## 2022-04-28 DIAGNOSIS — Z888 Allergy status to other drugs, medicaments and biological substances status: Secondary | ICD-10-CM

## 2022-04-28 DIAGNOSIS — R1312 Dysphagia, oropharyngeal phase: Secondary | ICD-10-CM | POA: Diagnosis not present

## 2022-04-28 DIAGNOSIS — R443 Hallucinations, unspecified: Secondary | ICD-10-CM | POA: Diagnosis not present

## 2022-04-28 DIAGNOSIS — Z9889 Other specified postprocedural states: Secondary | ICD-10-CM | POA: Diagnosis not present

## 2022-04-28 DIAGNOSIS — Z882 Allergy status to sulfonamides status: Secondary | ICD-10-CM

## 2022-04-28 DIAGNOSIS — G2581 Restless legs syndrome: Secondary | ICD-10-CM | POA: Diagnosis not present

## 2022-04-28 DIAGNOSIS — I509 Heart failure, unspecified: Secondary | ICD-10-CM | POA: Insufficient documentation

## 2022-04-28 DIAGNOSIS — R609 Edema, unspecified: Principal | ICD-10-CM

## 2022-04-28 DIAGNOSIS — Z86718 Personal history of other venous thrombosis and embolism: Secondary | ICD-10-CM | POA: Insufficient documentation

## 2022-04-28 DIAGNOSIS — Z881 Allergy status to other antibiotic agents status: Secondary | ICD-10-CM

## 2022-04-28 DIAGNOSIS — L03115 Cellulitis of right lower limb: Secondary | ICD-10-CM | POA: Diagnosis not present

## 2022-04-28 DIAGNOSIS — I444 Left anterior fascicular block: Secondary | ICD-10-CM | POA: Diagnosis not present

## 2022-04-28 DIAGNOSIS — Z96651 Presence of right artificial knee joint: Secondary | ICD-10-CM | POA: Diagnosis present

## 2022-04-28 DIAGNOSIS — R2689 Other abnormalities of gait and mobility: Secondary | ICD-10-CM | POA: Diagnosis not present

## 2022-04-28 DIAGNOSIS — E876 Hypokalemia: Secondary | ICD-10-CM | POA: Diagnosis not present

## 2022-04-28 DIAGNOSIS — I890 Lymphedema, not elsewhere classified: Secondary | ICD-10-CM | POA: Diagnosis not present

## 2022-04-28 DIAGNOSIS — M79606 Pain in leg, unspecified: Secondary | ICD-10-CM | POA: Diagnosis not present

## 2022-04-28 DIAGNOSIS — Z5320 Procedure and treatment not carried out because of patient's decision for unspecified reasons: Secondary | ICD-10-CM | POA: Diagnosis present

## 2022-04-28 DIAGNOSIS — L03116 Cellulitis of left lower limb: Secondary | ICD-10-CM | POA: Diagnosis not present

## 2022-04-28 DIAGNOSIS — L03119 Cellulitis of unspecified part of limb: Secondary | ICD-10-CM

## 2022-04-28 DIAGNOSIS — M25551 Pain in right hip: Secondary | ICD-10-CM | POA: Diagnosis present

## 2022-04-28 DIAGNOSIS — I10 Essential (primary) hypertension: Secondary | ICD-10-CM | POA: Diagnosis present

## 2022-04-28 DIAGNOSIS — Z471 Aftercare following joint replacement surgery: Secondary | ICD-10-CM | POA: Diagnosis not present

## 2022-04-28 DIAGNOSIS — R41841 Cognitive communication deficit: Secondary | ICD-10-CM | POA: Diagnosis not present

## 2022-04-28 DIAGNOSIS — Z981 Arthrodesis status: Secondary | ICD-10-CM

## 2022-04-28 LAB — CBC WITH DIFFERENTIAL/PLATELET
Abs Immature Granulocytes: 0.02 10*3/uL (ref 0.00–0.07)
Basophils Absolute: 0.1 10*3/uL (ref 0.0–0.1)
Basophils Relative: 1 %
Eosinophils Absolute: 0.1 10*3/uL (ref 0.0–0.5)
Eosinophils Relative: 1 %
HCT: 44.4 % (ref 36.0–46.0)
Hemoglobin: 14.3 g/dL (ref 12.0–15.0)
Immature Granulocytes: 0 %
Lymphocytes Relative: 22 %
Lymphs Abs: 1.7 10*3/uL (ref 0.7–4.0)
MCH: 28.3 pg (ref 26.0–34.0)
MCHC: 32.2 g/dL (ref 30.0–36.0)
MCV: 87.9 fL (ref 80.0–100.0)
Monocytes Absolute: 0.6 10*3/uL (ref 0.1–1.0)
Monocytes Relative: 8 %
Neutro Abs: 5.3 10*3/uL (ref 1.7–7.7)
Neutrophils Relative %: 68 %
Platelets: 188 10*3/uL (ref 150–400)
RBC: 5.05 MIL/uL (ref 3.87–5.11)
RDW: 19 % — ABNORMAL HIGH (ref 11.5–15.5)
WBC: 7.7 10*3/uL (ref 4.0–10.5)
nRBC: 0 % (ref 0.0–0.2)

## 2022-04-28 LAB — COMPREHENSIVE METABOLIC PANEL
ALT: 19 U/L (ref 0–44)
AST: 28 U/L (ref 15–41)
Albumin: 3.4 g/dL — ABNORMAL LOW (ref 3.5–5.0)
Alkaline Phosphatase: 91 U/L (ref 38–126)
Anion gap: 8 (ref 5–15)
BUN: 12 mg/dL (ref 8–23)
CO2: 30 mmol/L (ref 22–32)
Calcium: 8.5 mg/dL — ABNORMAL LOW (ref 8.9–10.3)
Chloride: 100 mmol/L (ref 98–111)
Creatinine, Ser: 0.63 mg/dL (ref 0.44–1.00)
GFR, Estimated: >60 mL/min (ref >60)
Glucose, Bld: 121 mg/dL — ABNORMAL HIGH (ref 70–99)
Potassium: 3.7 mmol/L (ref 3.5–5.1)
Sodium: 138 mmol/L (ref 135–145)
Total Bilirubin: 0.7 mg/dL (ref 0.3–1.2)
Total Protein: 6.7 g/dL (ref 6.5–8.1)

## 2022-04-28 LAB — URINALYSIS, ROUTINE W REFLEX MICROSCOPIC
Bacteria, UA: NONE SEEN
Bilirubin Urine: NEGATIVE
Glucose, UA: NEGATIVE mg/dL
Hgb urine dipstick: NEGATIVE
Ketones, ur: NEGATIVE mg/dL
Leukocytes,Ua: NEGATIVE
Nitrite: POSITIVE — AB
Protein, ur: NEGATIVE mg/dL
Specific Gravity, Urine: 1.018 (ref 1.005–1.030)
pH: 6 (ref 5.0–8.0)

## 2022-04-28 LAB — BRAIN NATRIURETIC PEPTIDE: B Natriuretic Peptide: 32.5 pg/mL (ref 0.0–100.0)

## 2022-04-28 MED ORDER — CEPHALEXIN 500 MG PO CAPS
500.0000 mg | ORAL_CAPSULE | Freq: Once | ORAL | Status: AC
  Administered 2022-04-28: 500 mg via ORAL
  Filled 2022-04-28: qty 1

## 2022-04-28 MED ORDER — ACETAMINOPHEN 500 MG PO TABS
500.0000 mg | ORAL_TABLET | Freq: Four times a day (QID) | ORAL | Status: DC | PRN
  Administered 2022-04-29 – 2022-05-02 (×5): 500 mg via ORAL
  Filled 2022-04-28 (×7): qty 1

## 2022-04-28 MED ORDER — MORPHINE SULFATE (PF) 4 MG/ML IV SOLN
4.0000 mg | Freq: Once | INTRAVENOUS | Status: AC
  Administered 2022-04-28: 4 mg via INTRAVENOUS
  Filled 2022-04-28: qty 1

## 2022-04-28 MED ORDER — FUROSEMIDE 40 MG PO TABS
40.0000 mg | ORAL_TABLET | Freq: Every day | ORAL | Status: DC
  Administered 2022-04-29 – 2022-05-02 (×4): 40 mg via ORAL
  Filled 2022-04-28 (×4): qty 1

## 2022-04-28 MED ORDER — APIXABAN 5 MG PO TABS
5.0000 mg | ORAL_TABLET | Freq: Two times a day (BID) | ORAL | Status: DC
  Administered 2022-04-29 – 2022-05-02 (×8): 5 mg via ORAL
  Filled 2022-04-28 (×8): qty 1

## 2022-04-28 MED ORDER — PROCHLORPERAZINE EDISYLATE 10 MG/2ML IJ SOLN: 5.0000 mg | Freq: Four times a day (QID) | INTRAMUSCULAR | Status: DC | PRN

## 2022-04-28 MED ORDER — PRAMIPEXOLE DIHYDROCHLORIDE 0.125 MG PO TABS
0.1250 mg | ORAL_TABLET | Freq: Every day | ORAL | Status: DC
  Administered 2022-04-29 – 2022-05-02 (×4): 0.125 mg via ORAL
  Filled 2022-04-28 (×4): qty 1

## 2022-04-28 MED ORDER — POLYETHYLENE GLYCOL 3350 17 G PO PACK: 17.0000 g | PACK | Freq: Every day | ORAL | Status: DC | PRN

## 2022-04-28 MED ORDER — THIAMINE HCL 100 MG PO TABS: 100.0000 mg | ORAL_TABLET | Freq: Every day | ORAL | Status: DC

## 2022-04-28 MED ORDER — MELATONIN 3 MG PO TABS
3.0000 mg | ORAL_TABLET | Freq: Every evening | ORAL | Status: DC | PRN
  Administered 2022-04-29 – 2022-05-02 (×2): 3 mg via ORAL
  Filled 2022-04-28 (×2): qty 1

## 2022-04-28 MED ORDER — ADULT MULTIVITAMIN W/MINERALS CH
1.0000 | ORAL_TABLET | Freq: Every day | ORAL | Status: DC
  Administered 2022-04-29 – 2022-05-02 (×4): 1 via ORAL
  Filled 2022-04-28 (×4): qty 1

## 2022-04-28 MED ORDER — ONDANSETRON HCL 4 MG/2ML IJ SOLN
4.0000 mg | Freq: Once | INTRAMUSCULAR | Status: AC
  Administered 2022-04-28: 4 mg via INTRAVENOUS
  Filled 2022-04-28: qty 2

## 2022-04-28 MED ORDER — FOLIC ACID 1 MG PO TABS
1.0000 mg | ORAL_TABLET | Freq: Every day | ORAL | Status: DC
  Administered 2022-04-29 – 2022-05-02 (×4): 1 mg via ORAL
  Filled 2022-04-28 (×4): qty 1

## 2022-04-28 NOTE — H&P (Incomplete)
History and Physical  Denise Kelly [id number] DOB: 01-Apr-1940 DOA: 04/28/2022  Referring physician: Bard Herbert  PCP: [name], MD  Outpatient Specialists: Vascular surgery, ENT, GI, cardiology, infectious disease. Patient coming from: Home  Chief Complaint: Generalized weakness, pain in legs.  HPI: Denise Kelly is a 82 y.o. female with medical history significant for lymphedema, chronic pain syndrome, restless leg syndrome, GERD, hypertension, history of pulmonary embolism and DVT on Eliquis, who presented to [facility] ED from home via EMS, due to generalized weakness and bilateral lower extremity pain.  Discharged from the hospital just 5 days ago after being admitted and treated for mild bilateral lower extremity cellulitis.  Plan initially was to discharge to SNF however the patient declined SNF and was discharged to her home with home health services.  She was prescribed 5 days of Keflex, to complete the course of her antibiotics on 04/28/2022.  States she has been compliant with her home medications.    Brought into the ED via EMS.  Per EMS, patient was laying in her bed full of urine.  The patient has had some difficulty taking care of herself.  States she is the only living member of her family.  She lost her daughter, her only child, to suicide last year.  Her daughter worked as an Public relations account executive for [employer].    The patient states she would be willing to go to SNF this time, at discharge, and prefers [facility].  In the ED, vital signs are stable.  Afebrile with no leukocytosis.  The patient was admitted by [facility], hospitalist service.  ED Course: Tmax 98.3.  BP 04/09/1973, pulse 94, respiratory 18, O2 saturation 94% on room air.  Lab studies notable for serum glucose 121, albumin 3.4.  CBC essentially unremarkable.  Review of Systems: Review of systems as noted in the HPI. All other systems reviewed and are negative.   Past Medical History:  Diagnosis Date   Alcohol abuse    ETOH and  xanax   Anxiety    Panic attacks    Arthritis    Cancer ([place])    skin- basal , squamous , melonoma- right hand   Cellulitis    right leg   Complication of anesthesia 2009   "felt drunk for a week after"- AVM- both times- felt drunk   Depression    Encephalopathy    GERD (gastroesophageal reflux disease)    HOH (hard of hearing)    HOH (hard of hearing)    Hypertension    not on mediacations   Palpitations    PONV (postoperative nausea and vomiting)    Spondylolisthesis of lumbosacral region    UTI (urinary tract infection)    frequently   Past Surgical History:  Procedure Laterality Date   BIOPSY  01/24/2018   Procedure: BIOPSY;  Surgeon: Ronnette Juniper, MD;  Location: [facility] ENDOSCOPY;  Service: Gastroenterology;;   BREAST SURGERY Right    2 breast biopsies   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   COLONOSCOPY  2011   polyps   ESOPHAGOGASTRODUODENOSCOPY (EGD) WITH PROPOFOL N/A 01/24/2018   Procedure: ESOPHAGOGASTRODUODENOSCOPY (EGD) WITH PROPOFOL;  Surgeon: Ronnette Juniper, MD;  Location: [place];  Service: Gastroenterology;  Laterality: N/A;   EYE SURGERY Bilateral    cataracts   INTRAMEDULLARY (IM) NAIL INTERTROCHANTERIC Left 11/29/2015   Procedure: LEFT HIP   NAIL;  Surgeon: Renette Butters, MD;  Location: [place];  Service: Orthopedics;  Laterality: Left;   JOINT REPLACEMENT  Left 09/2009   knee   LUMBAR FUSION  07/17/2016   Lumbar four-five Posterior lumbar interbody fusion (N/A)   TONSILLECTOMY     TOTAL KNEE ARTHROPLASTY Right 01/02/2014   Procedure: TOTAL KNEE ARTHROPLASTY;  Surgeon: [name], MD;  Location: [place];  Service: Orthopedics;  Laterality: Right;   TUBAL LIGATION      Social History:  reports that she quit smoking about 30 years ago. She has a 20.00 pack-year smoking history. She has never used smokeless tobacco. She reports that she does not drink alcohol and does not use drugs.   Allergies  Allergen Reactions   Sulfa Antibiotics Hives,  Swelling and Other (See Comments)    Reaction:  Facial/eye swelling    Coreg [Carvedilol] Other (See Comments)    Memory loss   Motrin [Ibuprofen] Palpitations   Toprol Xl [Metoprolol Tartrate] Cough   Doxycycline Hyclate Other (See Comments)    HEARTBURN   Flovent Hfa [Fluticasone] Itching   Statins Other (See Comments)    MENTAL STATUS CHANGE    Family History  Problem Relation Age of Onset   Hypertension Other    Cancer Mother    Cancer Father       Prior to Admission medications   Medication Sig Start Date End Date Taking? Authorizing Provider  acetaminophen (TYLENOL) 500 MG tablet Take 500 mg by mouth every 6 (six) hours as needed for moderate pain.    [provider]  apixaban (ELIQUIS) 5 MG TABS tablet Take 5 mg by mouth 2 (two) times daily.    [provider]  cephALEXin (KEFLEX) 500 MG capsule Take 1 capsule (500 mg total) by mouth 3 (three) times daily for 5 days. 04/23/22 04/28/22  Pokhrel, Corrie Mckusick, MD  folic acid (FOLVITE) 1 MG tablet Take 1 tablet (1 mg total) by mouth daily. 04/23/22 08/01/22  Pokhrel, Corrie Mckusick, MD  furosemide (LASIX) 40 MG tablet Take 1 tablet (40 mg total) by mouth daily. 04/23/22 07/22/22  Pokhrel, Corrie Mckusick, MD  Multiple Vitamin (MULTIVITAMIN WITH MINERALS) TABS tablet Take 1 tablet by mouth daily. 04/23/22   Pokhrel, Corrie Mckusick, MD  ondansetron (ZOFRAN-ODT) 4 MG disintegrating tablet Take 4 mg by mouth every 8 (eight) hours as needed for nausea or vomiting. 04/13/22   [provider]  pramipexole (MIRAPEX) 0.125 MG tablet Take 0.125 mg by mouth daily. 04/15/22   [provider]  thiamine (VITAMIN B1) 100 MG tablet Take 1 tablet (100 mg total) by mouth daily. 04/23/22 08/01/22  Pokhrel, Corrie Mckusick, MD  traZODone (DESYREL) 100 MG tablet Take 200 mg by mouth at bedtime as needed for sleep. 04/08/22   [provider]    Physical Exam: BP 135/74   Pulse 91   Temp 98.1 F (36.7 C) (Oral)   Resp 17   Wt 86.6 kg   SpO2 96%   BMI  29.90 kg/m   General: 82 y.o. year-old female well developed well nourished in no acute distress.  Alert and oriented x3. Cardiovascular: Regular rate and rhythm with no rubs or gallops.  No thyromegaly or JVD noted.  2+ pitting edema in lower extremities bilaterally. Respiratory: Clear to auscultation with no wheezes or rales. Good inspiratory effort. Abdomen: Soft nontender nondistended with normal bowel sounds x4 quadrants. Muskuloskeletal: No cyanosis, clubbing or edema noted bilaterally Neuro: CN II-XII intact, strength, sensation, reflexes Skin: Symmetrical edema and erythema in lower extremities bilaterally. Psychiatry: Judgement and insight appear normal. Mood is appropriate for condition and setting  Labs on Admission:  Basic Metabolic Panel: Recent Labs  Lab 04/22/22 0414 04/23/22 0443 04/28/22 1549  NA 137 137 138  K 2.9* 3.9 3.7  CL 98 98 100  CO2 30 30 30  $ GLUCOSE 117* 115* 121*  BUN 19 20 12  $ CREATININE 0.71 0.74 0.63  CALCIUM 8.3* 8.6* 8.5*  MG 2.2 1.9  --    Liver Function Tests: Recent Labs  Lab 04/28/22 1549  AST 28  ALT 19  ALKPHOS 91  BILITOT 0.7  PROT 6.7  ALBUMIN 3.4*   No results for input(s): "LIPASE", "AMYLASE" in the last 168 hours. No results for input(s): "AMMONIA" in the last 168 hours. CBC: Recent Labs  Lab 04/22/22 0414 04/23/22 0443 04/28/22 1549  WBC 6.1 6.3 7.7  NEUTROABS  --   --  5.3  HGB 12.9 13.4 14.3  HCT 40.5 41.8 44.4  MCV 87.9 88.4 87.9  PLT 181 190 188   Cardiac Enzymes: No results for input(s): "CKTOTAL", "CKMB", "CKMBINDEX", "TROPONINI" in the last 168 hours.  BNP (last 3 results) Recent Labs    04/05/22 2359 04/28/22 1549  BNP 29.8 32.5    ProBNP (last 3 results) No results for input(s): "PROBNP" in the last 8760 hours.  CBG: No results for input(s): "GLUCAP" in the last 168 hours.  Radiological Exams on Admission: No results found.  EKG: I independently viewed the EKG done and my  findings are as followed: Sinus rhythm rate of 88.  RBBB P, LAFB.  QTc 546.  Assessment/Plan Present on Admission: **None**  Principal Problem:   Generalized weakness  Generalized weakness Likely from physical debility Recently discharged from the hospital 5 days ago, SNF was offered however she declined at that time. Is agreeable to SNF placement at discharge, this time, prefers [facility]. PT/OT/TOC consulted Fall precautions  Prolonged QTc Admission 12 lead EKG showing QTc 546 Avoid QTc prolonging agents Optimize magnesium level, goal magnesium greater than 2.0 Optimize potassium level, goal potassium level greater than 4.0.  Recent admission for mild bilateral lower extremity cellulitis in the setting of lymphedema and likely chronic venous insufficiency Was discharged home with prescription for Keflex to complete course on 04/28/2022. Currently afebrile with no leukocytosis.  Likely chronic venous insufficiency Symmetrical bilateral lower extremity edema and erythema Elevate lower extremities Pain control as needed  Paroxysmal A-fib, rate controlled History of DVT/PE On Eliquis Monitor on telemetry  Chronic constipation Bowel regimen in place as needed    DVT prophylaxis: Home Eliquis  Code Status: Full code  Family Communication: None at bedside.  Disposition Plan: Admitted to telemetry unit.  Consults called: None.  Admission status: Observation status.   Status is: Observation    Kayleen Memos MD [facility] Pager [phone]  If 7PM-7AM, please contact night-coverage [email or web address] Password [facility]  04/28/2022, 11:54 PM

## 2022-04-28 NOTE — ED Provider Triage Note (Signed)
Emergency Medicine Provider Triage Evaluation Note  Denise Kelly , a 82 y.o. female  was evaluated in triage.  Pt complains of bilateral lower extremity pain and edema.  Recently admitted to the hospital on 2/4 to 2/7 due to lower extremity cellulitis.  Patient diuresed and treated with IV antibiotics for lower extremity cellulitis.  Patient admits to persistent symptoms.  Denies chest pain and shortness of breath.  History of DVT/PE.  Ultrasound negative on 2/5 for DVT. No fever.  Review of Systems  Positive: Lower extremity edema Negative: CP  Physical Exam  BP 122/73 (BP Location: Right Arm)   Pulse 99   Temp 98.3 F (36.8 C) (Oral)   Resp 16   Wt 86.6 kg   SpO2 100%   BMI 29.90 kg/m  Gen:   Awake, no distress   Resp:  Normal effort  MSK:   Moves extremities without difficulty  Other:    Medical Decision Making  Medically screening exam initiated at 3:15 PM.  Appropriate orders placed.  CLAIRISSA TORNER was informed that the remainder of the evaluation will be completed by another provider, this initial triage assessment does not replace that evaluation, and the importance of remaining in the ED until their evaluation is complete.  labs   Suzy Bouchard, [place] 04/28/22 1517

## 2022-04-28 NOTE — ED Triage Notes (Signed)
BIB ems from home with c/o bilateral leg swell d/t lymphedema. Pt was dc from hospital 4 days ago for the same. Pt has not been taking medication she was prescribed from hospital. Per EMS pt was laying in her bed full of urine and advised EMS that someone comes into her home daily to bring [employer] and feed cats but does not help take care of her.   A&O x4  Bp 136/86 HR 112 RA 96%

## 2022-04-28 NOTE — ED Provider Notes (Signed)
[facility] Provider Note   CSN: [id number] Arrival date & time: 04/28/22  1434     History  Chief Complaint  Patient presents with   Leg Swelling    Denise Kelly is a 82 y.o. female with a past medical history significant for hypertension, history of recurrent UTIs, hx of PE on Eliquis, history of lymphedema who presents to the ED due to persistent bilateral lower extremity edema and pain.  Recently admitted for the same on 2/4 to 2/7.  During her admission patient was offered SNF placement however, preferred to go home.  Patient lives alone.  It appears patient has difficulties taking care of herself per EMS report and through chart review.  EMS found patient covered in urine.  Patient states she has been compliant with her medications after discharge including Keflex and Lasix.  Patient states pain has worsened.  No chest pain or shortness of breath.  Denies fever.  Patient states she believes her edema has slightly improved.   History obtained from patient and past medical records. No interpreter used during encounter.       Home Medications Prior to Admission medications   Medication Sig Start Date End Date Taking? Authorizing Provider  acetaminophen (TYLENOL) 500 MG tablet Take 500 mg by mouth every 6 (six) hours as needed for moderate pain.    [provider]  apixaban (ELIQUIS) 5 MG TABS tablet Take 5 mg by mouth 2 (two) times daily.    [provider]  cephALEXin (KEFLEX) 500 MG capsule Take 1 capsule (500 mg total) by mouth 3 (three) times daily for 5 days. 04/23/22 04/28/22  Pokhrel, Corrie Mckusick, MD  folic acid (FOLVITE) 1 MG tablet Take 1 tablet (1 mg total) by mouth daily. 04/23/22 08/01/22  Pokhrel, Corrie Mckusick, MD  furosemide (LASIX) 40 MG tablet Take 1 tablet (40 mg total) by mouth daily. 04/23/22 07/22/22  Pokhrel, Corrie Mckusick, MD  Multiple Vitamin (MULTIVITAMIN WITH MINERALS) TABS tablet Take 1 tablet by mouth daily. 04/23/22    Pokhrel, Corrie Mckusick, MD  ondansetron (ZOFRAN-ODT) 4 MG disintegrating tablet Take 4 mg by mouth every 8 (eight) hours as needed for nausea or vomiting. 04/13/22   [provider]  pramipexole (MIRAPEX) 0.125 MG tablet Take 0.125 mg by mouth daily. 04/15/22   [provider]  thiamine (VITAMIN B1) 100 MG tablet Take 1 tablet (100 mg total) by mouth daily. 04/23/22 08/01/22  Pokhrel, Corrie Mckusick, MD  traZODone (DESYREL) 100 MG tablet Take 200 mg by mouth at bedtime as needed for sleep. 04/08/22   [provider]      Allergies    Sulfa antibiotics, Coreg [carvedilol], Motrin [ibuprofen], Toprol xl [metoprolol tartrate], Doxycycline hyclate, Flovent hfa [fluticasone], and Statins    Review of Systems   Review of Systems  Constitutional:  Negative for chills and fever.  Respiratory:  Negative for shortness of breath.   Cardiovascular:  Positive for leg swelling. Negative for chest pain.  Gastrointestinal:  Negative for abdominal pain.  Musculoskeletal:  Positive for arthralgias.  All other systems reviewed and are negative.   Physical Exam Updated Vital Signs BP 135/74   Pulse 91   Temp 98.1 F (36.7 C) (Oral)   Resp 17   Wt 86.6 kg   SpO2 96%   BMI 29.90 kg/m  Physical Exam Vitals and nursing note reviewed.  Constitutional:      General: She is not in acute distress.    Appearance: She is not ill-appearing.  HENT:     Head: Normocephalic.  Eyes:     Pupils: Pupils are equal, round, and reactive to light.  Cardiovascular:     Rate and Rhythm: Normal rate and regular rhythm.     Pulses: Normal pulses.     Heart sounds: Normal heart sounds. No murmur heard.    No friction rub. No gallop.  Pulmonary:     Effort: Pulmonary effort is normal.     Breath sounds: Normal breath sounds.  Abdominal:     General: Abdomen is flat. There is no distension.     Palpations: Abdomen is soft.     Tenderness: There is no abdominal tenderness. There is no guarding or rebound.   Musculoskeletal:        General: Normal range of motion.     Cervical back: Neck supple.     Comments: Bilateral lower extremity edema and erythema. See photo below.   Skin:    General: Skin is warm and dry.  Neurological:     General: No focal deficit present.     Mental Status: She is alert.  Psychiatric:        Mood and Affect: Mood normal.        Behavior: Behavior normal.     ED Results / Procedures / Treatments   Labs (all labs ordered are listed, but only abnormal results are displayed) Labs Reviewed  CBC WITH DIFFERENTIAL/PLATELET - Abnormal; Notable for the following components:      Result Value   RDW 19.0 (*)    All other components within normal limits  COMPREHENSIVE METABOLIC PANEL - Abnormal; Notable for the following components:   Glucose, Bld 121 (*)    Calcium 8.5 (*)    Albumin 3.4 (*)    All other components within normal limits  BRAIN NATRIURETIC PEPTIDE  URINALYSIS, ROUTINE W REFLEX MICROSCOPIC    EKG None  Radiology No results found.  Procedures Procedures    Medications Ordered in ED Medications  cephALEXin (KEFLEX) capsule 500 mg (has no administration in time range)  morphine (PF) 4 MG/ML injection 4 mg (4 mg Intravenous Given 04/28/22 2230)  ondansetron (ZOFRAN) injection 4 mg (4 mg Intravenous Given 04/28/22 2229)    ED Course/ Medical Decision Making/ A&P Clinical Course as of 04/28/22 2326  Mon Apr 28, 2022  2110 WBC: 7.7 [CA]  2110 B Natriuretic Peptide: 32.5 [CA]    Clinical Course User Index [CA] Suzy Bouchard, PA-C                             Medical Decision Making Amount and/or Complexity of Data Reviewed Independent Historian: EMS External Data Reviewed: notes.    Details: Previous admission notes Labs: ordered. Decision-making details documented in ED Course.  Risk Prescription drug management. Decision regarding hospitalization.    This patient presents to the ED for concern of lower extremity  edema/erythema, this involves an extensive number of treatment options, and is a complaint that carries with it a high risk of complications and morbidity.  The differential diagnosis includes cellulitis, lymphedema, DVT, venous statis changes,  82 year old female presents to the ED due to persistent lower extremity edema and pain.  History of lymphedema.  Recently admitted for bilateral lower extremity cellulitis and lymphedema and discharged on 2/7.  Patient notes she has been unable to ambulate since being discharged due to severe pain.  Per chart review, during her admission it was recommended patient  be discharged to SNF however, patient declined and preferred to be discharged home.  EMS found patient covered in urine in bed because patient has been unable to ambulate.  Upon arrival, patient afebrile, not tachycardic or hypoxic.  Patient in no acute distress.  Physical exam significant for bilateral lower extremity edema and erythema.  Photo above.  Erythema appears to be improving from previous pictures.  Patient notes she has been compliant with her Keflex however, has not taken it today.  Ultrasound a few days ago was negative for DVT. Low suspicion for DVT/PE. Patient denies chest pain and shortness of breath. Routine labs ordered.  UA to rule out UTI.  CBC reassuring.  No leukocytosis.  Normal hemoglobin.  CMP significant for hyperglycemia at 121.  No anion gap.  Normal renal function.  No major electrolyte derangements. BNP normal. Doubt CHF.  Given persistent pain and continued ?cellulitis vs ?venous statis changes, feel patient would benefit from admission. I also had a long discussion with patient about my concerns about her ability to take care of herself at home. Patient is now agreeable to SN if deemed necessary during her admission. Will consult hospitalist for admission. Discussed with Dr. Roderic Palau who evaluated patient at bedside and agrees with assessment and plan.   11:41 PM Discussed  with Dr. Nevada Crane with [facility] who agrees to admit patient.   Has PCP       Final Clinical Impression(s) / ED Diagnoses Final diagnoses:  Peripheral edema  Cellulitis of lower extremity, unspecified laterality    Rx / DC Orders ED Discharge Orders     None         Karie Kirks 04/28/22 2343    Milton Ferguson, MD 05/02/22 1023

## 2022-04-29 ENCOUNTER — Encounter (HOSPITAL_COMMUNITY): Payer: Self-pay | Admitting: Internal Medicine

## 2022-04-29 ENCOUNTER — Observation Stay (HOSPITAL_COMMUNITY): Payer: Medicare Other

## 2022-04-29 DIAGNOSIS — Q820 Hereditary lymphedema: Secondary | ICD-10-CM | POA: Diagnosis not present

## 2022-04-29 DIAGNOSIS — I10 Essential (primary) hypertension: Secondary | ICD-10-CM

## 2022-04-29 DIAGNOSIS — R531 Weakness: Secondary | ICD-10-CM | POA: Diagnosis not present

## 2022-04-29 DIAGNOSIS — I5032 Chronic diastolic (congestive) heart failure: Secondary | ICD-10-CM

## 2022-04-29 DIAGNOSIS — M25551 Pain in right hip: Secondary | ICD-10-CM

## 2022-04-29 DIAGNOSIS — Z86718 Personal history of other venous thrombosis and embolism: Secondary | ICD-10-CM

## 2022-04-29 DIAGNOSIS — K219 Gastro-esophageal reflux disease without esophagitis: Secondary | ICD-10-CM

## 2022-04-29 DIAGNOSIS — J189 Pneumonia, unspecified organism: Secondary | ICD-10-CM | POA: Diagnosis not present

## 2022-04-29 LAB — BASIC METABOLIC PANEL
Anion gap: 10 (ref 5–15)
BUN: 17 mg/dL (ref 8–23)
CO2: 29 mmol/L (ref 22–32)
Calcium: 8.3 mg/dL — ABNORMAL LOW (ref 8.9–10.3)
Chloride: 100 mmol/L (ref 98–111)
Creatinine, Ser: 0.75 mg/dL (ref 0.44–1.00)
GFR, Estimated: >60 mL/min (ref >60)
Glucose, Bld: 113 mg/dL — ABNORMAL HIGH (ref 70–99)
Potassium: 3.4 mmol/L — ABNORMAL LOW (ref 3.5–5.1)
Sodium: 139 mmol/L (ref 135–145)

## 2022-04-29 LAB — CBC
HCT: 36 % (ref 36.0–46.0)
Hemoglobin: 11.8 g/dL — ABNORMAL LOW (ref 12.0–15.0)
MCH: 29 pg (ref 26.0–34.0)
MCHC: 32.8 g/dL (ref 30.0–36.0)
MCV: 88.5 fL (ref 80.0–100.0)
Platelets: 159 10*3/uL (ref 150–400)
RBC: 4.07 MIL/uL (ref 3.87–5.11)
RDW: 19.1 % — ABNORMAL HIGH (ref 11.5–15.5)
WBC: 6.2 10*3/uL (ref 4.0–10.5)
nRBC: 0 % (ref 0.0–0.2)

## 2022-04-29 LAB — FOLATE: Folate: 9 ng/mL (ref >5.9)

## 2022-04-29 LAB — RESP PANEL BY RT-PCR (RSV, FLU A&B, COVID)  RVPGX2
Influenza A by PCR: NEGATIVE
Influenza B by PCR: NEGATIVE
Resp Syncytial Virus by PCR: NEGATIVE
SARS Coronavirus 2 by RT PCR: NEGATIVE

## 2022-04-29 LAB — PHOSPHORUS: Phosphorus: 4.6 mg/dL (ref 2.5–4.6)

## 2022-04-29 LAB — VITAMIN B12: Vitamin B-12: 230 pg/mL (ref 180–914)

## 2022-04-29 LAB — TSH: TSH: 1.792 u[IU]/mL (ref 0.350–4.500)

## 2022-04-29 LAB — MAGNESIUM: Magnesium: 2.1 mg/dL (ref 1.7–2.4)

## 2022-04-29 MED ORDER — CAMPHOR-MENTHOL 0.5-0.5 % EX LOTN
TOPICAL_LOTION | CUTANEOUS | Status: DC | PRN
  Filled 2022-04-29: qty 222

## 2022-04-29 MED ORDER — GUAIFENESIN-DM 100-10 MG/5ML PO SYRP
5.0000 mL | ORAL_SOLUTION | ORAL | Status: DC | PRN
  Administered 2022-04-29 – 2022-05-02 (×7): 5 mL via ORAL
  Filled 2022-04-29 (×7): qty 5

## 2022-04-29 MED ORDER — THIAMINE MONONITRATE 100 MG PO TABS
100.0000 mg | ORAL_TABLET | Freq: Every day | ORAL | Status: DC
  Administered 2022-04-29 – 2022-04-30 (×2): 100 mg via ORAL
  Filled 2022-04-29 (×2): qty 1

## 2022-04-29 MED ORDER — TRAZODONE HCL 100 MG PO TABS
200.0000 mg | ORAL_TABLET | Freq: Every evening | ORAL | Status: DC | PRN
  Administered 2022-04-30 – 2022-05-01 (×3): 200 mg via ORAL
  Filled 2022-04-29 (×4): qty 2

## 2022-04-29 MED ORDER — OXYCODONE HCL 5 MG PO TABS
5.0000 mg | ORAL_TABLET | Freq: Once | ORAL | Status: AC
  Administered 2022-04-30: 5 mg via ORAL
  Filled 2022-04-29: qty 1

## 2022-04-29 MED ORDER — TRAMADOL HCL 50 MG PO TABS
25.0000 mg | ORAL_TABLET | Freq: Once | ORAL | Status: AC | PRN
  Administered 2022-04-29: 25 mg via ORAL
  Filled 2022-04-29: qty 1

## 2022-04-29 MED ORDER — ORAL CARE MOUTH RINSE: 15.0000 mL | OROMUCOSAL | Status: DC | PRN

## 2022-04-29 MED ORDER — POTASSIUM CHLORIDE CRYS ER 20 MEQ PO TBCR
20.0000 meq | EXTENDED_RELEASE_TABLET | Freq: Once | ORAL | Status: AC
  Administered 2022-04-29: 20 meq via ORAL
  Filled 2022-04-29: qty 1

## 2022-04-29 NOTE — TOC Initial Note (Signed)
Transition of Care [facility]) - Initial/Assessment Note    Patient Details  Name: Denise Kelly MRN: [id number] Date of Birth: [birth year]  Transition of Care [facility]) CM/SW Contact:    Vassie Moselle, LCSW Phone Number: 04/29/2022, 3:09 PM  Clinical Narrative:                 Met with pt and confirmed plan for SNF placement. Pt shares she has been to [facility] 5x and has been to [facility] x 1. Pt shares [facility] is her top choice for placement. Pt is open to referrals being to sent to additional facilities that are higher rated. SNF referrals have been made and currently awaiting bed offers.  Pt currently lives at home alone and states her neighbor will bring food and check on her daily.  Pt has no family/friends for support. She was referred to [facility] for HHPT/OT on recent admission. CSW made aware of concerns with pt's ability to care for herself at home. TOC will assess and follow for possible APS report.    Expected Discharge Plan: Skilled Nursing Facility Barriers to Discharge: SNF Pending bed offer   Patient Goals and CMS Choice Patient states their goals for this hospitalization and ongoing recovery are:: To go to [facility] CMS Medicare.gov Compare Post Acute Care list provided to:: Patient Choice offered to / list presented to : Patient [place] ownership interest in [facility].provided to:: Patient    Expected Discharge Plan and Services In-house Referral: Clinical Social Work Discharge Planning Services: NA Post Acute Care Choice: [place] Living arrangements for the past 2 months: Apartment                 DME Arranged: N/A DME Agency: NA                  Prior Living Arrangements/Services Living arrangements for the past 2 months: Apartment Lives with:: Self, Pets Patient language and need for interpreter reviewed:: Yes Do you feel safe going back to the place where you live?: Yes      Need for Family Participation in Patient Care:  No (Comment) Care giver support system in place?: No (comment) Current home services: DME, Home OT, Home PT Criminal Activity/Legal Involvement Pertinent to Current Situation/Hospitalization: No - Comment as needed  Activities of Daily Living Home Assistive Devices/Equipment: Walker (specify type) ADL Screening (condition at time of admission) Patient's cognitive ability adequate to safely complete daily activities?: Yes Is the patient deaf or have difficulty hearing?: Yes Does the patient have difficulty seeing, even when wearing glasses/contacts?: No Does the patient have difficulty concentrating, remembering, or making decisions?: Yes Patient able to express need for assistance with ADLs?: Yes Does the patient have difficulty dressing or bathing?: Yes Independently performs ADLs?: No Communication: Independent Dressing (OT): Needs assistance Is this a change from baseline?: Pre-admission baseline Grooming: Independent with device (comment) Feeding: Independent Bathing: Needs assistance Is this a change from baseline?: Pre-admission baseline Toileting: Appropriate for developmental age In/Out Bed: Needs assistance Is this a change from baseline?: Pre-admission baseline Walks in Home: Independent with device (comment) Does the patient have difficulty walking or climbing stairs?: Yes Weakness of Legs: Both Weakness of Arms/Hands: Both  Permission Sought/Granted Permission sought to share information with : Facility [facility] administrator granted to share info w AGENCY: SNF's        Emotional Assessment Appearance:: Appears stated age Attitude/Demeanor/Rapport: Engaged Affect (typically observed): Accepting Orientation: : Oriented  to Self, Oriented to Place, Oriented to  Time Alcohol / Substance Use: Not Applicable Psych Involvement: No (comment)  Admission diagnosis:  Peripheral edema [R60.9] Generalized weakness [R53.1] Cellulitis of lower extremity,  unspecified laterality [L03.119] Patient Active Problem List   Diagnosis Date Noted   Generalized weakness 04/28/2022   Cellulitis 04/20/2022   Lymphedema 04/20/2022   History of deep vein thrombosis (DVT) of lower extremity 04/20/2022   History of pulmonary embolus (PE) 04/20/2022   UTI (urinary tract infection) 0000000   Acute metabolic encephalopathy 0000000   Upper GI bleed 01/23/2018   Leukocytosis 01/23/2018   Physical deconditioning 01/23/2018   Acute pulmonary embolus ([place]) 01/04/2018   Acute encephalopathy 09/28/2017   Constipation, slow transit 09/28/2017   Unsteady gait 04/28/2017   Spondylolisthesis 07/17/2016   Bilateral lower extremity edema 07/17/2016   Chronic pain syndrome    Depression 05/23/2016   Chronic back pain 02/25/2016   Lesion of liver 02/25/2016   History of fracture of left hip 11/28/2015   Fall 11/28/2015   RLS (restless legs syndrome) 11/28/2015   Confusion 03/14/2014   Recurrent UTI 03/14/2014   Effusion of right knee 02/22/2014   Alcohol dependence ([place]) 02/22/2014   Substance induced mood disorder ([place]) 02/22/2014   Hepatic encephalopathy (HCC)    Altered mental status 02/21/2014   SIRS (systemic inflammatory response syndrome) ([place]) 02/21/2014   S/P total knee arthroplasty 01/02/2014   Essential hypertension, benign 11/10/2013   LAFB (left anterior fascicular block) 11/10/2013   Obesity, unspecified 11/10/2013   PCP:  [name], MD Pharmacy:   [id number]Phone: [phone]: [phone]    Social Determinants of Health (SDOH) Social History: SDOH Screenings   Food Insecurity: Food Insecurity Present (04/29/2022)  Housing: Low Risk  (04/29/2022)  Transportation Needs: Unmet Transportation Needs (04/29/2022)  Utilities: Not At Risk (04/29/2022)  Depression (PHQ2-9): Low Risk  (01/09/2019)  Tobacco Use: Medium Risk (04/29/2022)   SDOH  Interventions: Food Insecurity Interventions: Inpatient TOC, Intervention Not Indicated Transportation Interventions: Inpatient TOC, Intervention Not Indicated   Readmission Risk Interventions     No data to display

## 2022-04-29 NOTE — NC FL2 (Signed)
[place] LEVEL OF CARE FORM     IDENTIFICATION  Patient Name: Denise Kelly Birthdate: 1940/10/01 Sex: female Admission Date (Current Location): 04/28/2022  [facility] and [place] Number:  Herbalist and Address:  [facility],  [address], [place]      Provider Number: [id number]  Attending Physician Name and Address:  Vernelle Emerald, MD  Relative Name and Phone Number:       Current Level of Care: Hospital Recommended Level of Care: [place] Prior Approval Number:    Date Approved/Denied:   PASRR Number: [id number]  Discharge Plan: Home    Current Diagnoses: Patient Active Problem List   Diagnosis Date Noted   Generalized weakness 04/28/2022   Cellulitis 04/20/2022   Lymphedema 04/20/2022   History of deep vein thrombosis (DVT) of lower extremity 04/20/2022   History of pulmonary embolus (PE) 04/20/2022   UTI (urinary tract infection) 0000000   Acute metabolic encephalopathy 0000000   Upper GI bleed 01/23/2018   Leukocytosis 01/23/2018   Physical deconditioning 01/23/2018   Acute pulmonary embolus (HCC) 01/04/2018   Acute encephalopathy 09/28/2017   Constipation, slow transit 09/28/2017   Unsteady gait 04/28/2017   Spondylolisthesis 07/17/2016   Bilateral lower extremity edema 07/17/2016   Chronic pain syndrome    Depression 05/23/2016   Chronic back pain 02/25/2016   Lesion of liver 02/25/2016   History of fracture of left hip 11/28/2015   Fall 11/28/2015   RLS (restless legs syndrome) 11/28/2015   Confusion 03/14/2014   Recurrent UTI 03/14/2014   Effusion of right knee 02/22/2014   Alcohol dependence ([place]) 02/22/2014   Substance induced mood disorder ([place]) 02/22/2014   Hepatic encephalopathy (HCC)    Altered mental status 02/21/2014   SIRS (systemic inflammatory response syndrome) ([place]) 02/21/2014   S/P total knee arthroplasty 01/02/2014   Essential hypertension, benign  11/10/2013   LAFB (left anterior fascicular block) 11/10/2013   Obesity, unspecified 11/10/2013    Orientation RESPIRATION BLADDER Height & Weight     Self, Time, Place  Normal Incontinent Weight: 196 lb 3.4 oz (89 kg) Height:  5' 7"$  (170.2 cm)  BEHAVIORAL SYMPTOMS/MOOD NEUROLOGICAL BOWEL NUTRITION STATUS      Continent Diet (Heart Healthy Diet)  AMBULATORY STATUS COMMUNICATION OF NEEDS Skin   Limited Assist Verbally Normal                       Personal Care Assistance Level of Assistance  Bathing, Feeding, Dressing Bathing Assistance: Maximum assistance Feeding assistance: Limited assistance Dressing Assistance: Maximum assistance     Functional Limitations Info  Sight, Hearing, Speech Sight Info: Impaired Hearing Info: Impaired Speech Info: Adequate    SPECIAL CARE FACTORS FREQUENCY  PT (By licensed PT), OT (By licensed OT)     PT Frequency: 5x/wk OT Frequency: 5x/wk            Contractures Contractures Info: Not present    Additional Factors Info  Allergies, Code Status, Psychotropic Code Status Info: FULL Allergies Info: Sulfa Antibiotics, Coreg (Carvedilol), Motrin (Ibuprofen), Toprol Xl (Metoprolol Tartrate), Doxycycline Hyclate, Flovent Hfa (Fluticasone), Statins Psychotropic Info: Ativan         Current Medications (04/29/2022):  This is the current hospital active medication list Current Facility-Administered Medications  Medication Dose Route Frequency Provider Last Rate Last Admin   acetaminophen (TYLENOL) tablet 500 mg  500 mg Oral Q6H PRN Irene Pap N, DO   500 mg at 04/29/22  [id number]   apixaban (ELIQUIS) tablet 5 mg  5 mg Oral BID Irene Pap N, DO   5 mg at [id number] [id number]   folic acid (FOLVITE) tablet 1 mg  1 mg Oral Daily Irene Pap N, DO   1 mg at 04/29/22 0948   furosemide (LASIX) tablet 40 mg  40 mg Oral Daily Kayleen Memos, DO   40 mg at 04/29/22 [id number]   melatonin tablet 3 mg  3 mg Oral QHS PRN Kayleen Memos, DO       multivitamin  with minerals tablet 1 tablet  1 tablet Oral Daily Irene Pap N, DO   1 tablet at 04/29/22 0948   polyethylene glycol (MIRALAX / GLYCOLAX) packet 17 g  17 g Oral Daily PRN Irene Pap N, DO       pramipexole (MIRAPEX) tablet 0.125 mg  0.125 mg Oral Daily B[place], Carole N, DO   0.125 mg at 04/29/22 [id number]   prochlorperazine (COMPAZINE) injection 5 mg  5 mg Intravenous Q6H PRN Irene Pap N, DO       thiamine (VITAMIN B1) tablet 100 mg  100 mg Oral Daily Irene Pap N, DO   100 mg at 04/29/22 [id number]     Discharge Medications: Please see discharge summary for a list of discharge medications.  Relevant Imaging Results:  Relevant Lab Results:   Additional Information SSN: [id number]  Vassie Moselle, LCSW

## 2022-04-29 NOTE — Evaluation (Signed)
Occupational Therapy Evaluation Patient Details Name: Denise Kelly MRN: [id number] DOB: 17-Aug-1940 Today's Date: 04/29/2022   History of Present Illness Patient is a 82 year old female who presented with pain and persistent edema in bilateral lower extremities. Of note patient was admitted from 2/4 to 2/7 with UTI and cellulitis with patient transitioning home after declining to go SNF. PMH:  chronic pain syndrome, medical noncompliance, R TKA, L TKA, lumbar fusion.   Clinical Impression   Patient is a 82 year old female who was admitted for above. Patient lives alone with PRN support from friend [place]. Patient was noted in hospital admission to be found in bed soiled by EMS. Attempted to complete formal cognitive assessment during this admission but unable to complete with patient distracted and x-ray coming to room for testing. Formal cognitive assessment will be completed during next session. Strong concerns over patient transitioning back home alone at time of d/c with poor functional activity tolerance, decreased safety awareness, poor medication management strategies, decreased standing balance,and increased pain in BLE impacting participation in ADLs. Patient would benefit from short term rehab to work on improvement in above mentioned issues to increase independence and promote safe d/c to PLOF.      Recommendations for follow up therapy are one component of a multi-disciplinary discharge planning process, led by the attending physician.  Recommendations may be updated based on patient status, additional functional criteria and insurance authorization.   Follow Up Recommendations  Skilled nursing-short term rehab (<3 hours/day)     Assistance Recommended at Discharge Frequent or constant Supervision/Assistance  Patient can return home with the following A little help with walking and/or transfers;A lot of help with bathing/dressing/bathroom;Assistance with cooking/housework;Direct  supervision/assist for medications management;Assist for transportation;Help with stairs or ramp for entrance;Direct supervision/assist for financial management    Functional Status Assessment  Patient has had a recent decline in their functional status and demonstrates the ability to make significant improvements in function in a reasonable and predictable amount of time.  Equipment Recommendations  None recommended by OT       Precautions / Restrictions Precautions Precautions: Fall Precaution Comments: back precautions Restrictions Weight Bearing Restrictions: No      Mobility Bed Mobility Overal bed mobility: Needs Assistance Bed Mobility: Supine to Sit     Supine to sit: Min assist     General bed mobility comments: with increased time and A to bring trunk into midline and for scooting to EOB       Balance Overall balance assessment: Needs assistance Sitting-balance support: No upper extremity supported, Feet supported Sitting balance-Leahy Scale: Fair     Standing balance support: Reliant on assistive device for balance, During functional activity Standing balance-Leahy Scale: Poor           ADL either performed or assessed with clinical judgement   ADL Overall ADL's : Needs assistance/impaired Eating/Feeding: Set up;Sitting   Grooming: Wash/dry face;Set up;Sitting   Upper Body Bathing: Minimal assistance;Sitting   Lower Body Bathing: Maximal assistance;Sitting/lateral leans   Upper Body Dressing : Minimal assistance;Sitting   Lower Body Dressing: Moderate assistance Lower Body Dressing Details (indicate cue type and reason): patient noted to have consistent incontinece of urine. patient was able to help pull up undergarments and maintain standing balance with RW Toilet Transfer: Minimal assistance;Ambulation;Rolling walker (2 wheels) Toilet Transfer Details (indicate cue type and reason): to transfer from edge of bed to recliner in room with cues for  proper completion of task. Toileting- Clothing Manipulation and  Hygiene: Moderate assistance;Sit to/from stand Toileting - Clothing Manipulation Details (indicate cue type and reason): incontinent of urine             Vision Baseline Vision/History: 1 Wears glasses Vision Assessment?: No apparent visual deficits            Pertinent Vitals/Pain Pain Assessment Pain Assessment: Faces Faces Pain Scale: Hurts even more Pain Location: BLE Pain Descriptors / Indicators: Grimacing, Discomfort, Sore Pain Intervention(s): Limited activity within patient's tolerance, Monitored during session, Repositioned     Hand Dominance Right   Extremity/Trunk Assessment Upper Extremity Assessment Upper Extremity Assessment: Overall WFL for tasks assessed   Lower Extremity Assessment Lower Extremity Assessment: Defer to PT evaluation   Cervical / Trunk Assessment Cervical / Trunk Assessment: Normal   Communication Communication Communication: HOH   Cognition Arousal/Alertness: Awake/alert Behavior During Therapy: WFL for tasks assessed/performed Overall Cognitive Status: Difficult to assess         General Comments: attempted to complete formal cognitive assessment with evaluation intrupted multiple times and patient distracted. will attempt during next session.                Home Living Family/patient expects to be discharged to:: Skilled nursing facility Living Arrangements: Alone Available Help at Discharge: Neighbor;Available PRN/intermittently Type of Home: Apartment Home Access: Stairs to enter Entrance Stairs-Number of Steps: 1   Home Layout: One level     Bathroom Shower/Tub: Teacher, early years/pre: Standard Bathroom Accessibility: Yes How Accessible: Accessible via walker Home Equipment: Rolling Walker (2 wheels)          Prior Functioning/Environment Prior Level of Function : Needs assist       Physical Assist : Mobility (physical);ADLs  (physical)   ADLs (physical): Bathing;Dressing;IADLs Mobility Comments: uses walker, reports being very fearful of falling ADLs Comments: Patient reports being independent. patient had help from daughter in past for ADLs and IADLs. noted that daughter has passed since last admission        OT Problem List: Decreased strength;Decreased activity tolerance;Impaired balance (sitting and/or standing);Decreased coordination;Decreased safety awareness;Decreased knowledge of precautions;Decreased knowledge of use of DME or AE;Pain      OT Treatment/Interventions: Self-care/ADL training;Energy conservation;DME and/or AE instruction;Therapeutic exercise;Therapeutic activities;Patient/family education;Balance training    OT Goals(Current goals can be found in the care plan section) Acute Rehab OT Goals Patient Stated Goal: to go home OT Goal Formulation: With patient Time For Goal Achievement: 05/13/22 Potential to Achieve Goals: Fair  OT Frequency: Min 2X/week       AM-PAC OT "6 Clicks" Daily Activity     Outcome Measure Help from another person eating meals?: A Little Help from another person taking care of personal grooming?: A Little Help from another person toileting, which includes using toliet, bedpan, or urinal?: A Lot Help from another person bathing (including washing, rinsing, drying)?: A Lot Help from another person to put on and taking off regular upper body clothing?: A Little Help from another person to put on and taking off regular lower body clothing?: A Lot 6 Click Score: 15   End of Session Equipment Utilized During Treatment: Rolling walker (2 wheels);Gait belt Nurse Communication: Other (comment)  Activity Tolerance: Patient tolerated treatment well Patient left: in chair;with call bell/phone within reach;with chair alarm set;Other (comment) (xray in room to complete task)  OT Visit Diagnosis: Unsteadiness on feet (R26.81);Other abnormalities of gait and mobility  (R26.89);Muscle weakness (generalized) (M62.81);Pain  [id number] OT Time Calculation (min): 26 min Charges:  OT General Charges $OT Visit: 1 Visit OT Evaluation $OT Eval Moderate Complexity: 1 Mod OT Treatments $Self Care/Home Management : 8-22 mins  Rennie Plowman, MS Acute Rehabilitation Department Office# [phone]   Willa Rough 04/29/2022, 10:35 AM

## 2022-04-29 NOTE — Evaluation (Signed)
Physical Therapy Evaluation Patient Details Name: Denise Kelly MRN: [id number] DOB: [birth year] Today's Date: 04/29/2022  History of Present Illness  Patient is a 82 year old female who presented with pain and persistent edema in bilateral lower extremities. Of note patient was admitted from 2/4 to 2/7 with UTI and cellulitis with patient transitioning home after declining to go SNF. PMH:  chronic pain syndrome, medical noncompliance, R TKA, L TKA, lumbar fusion.   Clinical Impression  [name] is 82 y.o. female admitted with above HPI and diagnosis. Patient is currently limited by functional impairments below (see PT problem list). Patient lives alone at home and is reliant on RW for mobility at baseline. She appears partially oriented to place, self, and time, but does not seem aware of situation fully. Pt also expressing delusional thoughts of "Lindwood Qua" being her significant other indicating impaired cognition. She required Min assist for transfers from EOB and toilet and was able to ambulate with RW to bathroom with cues and intermittent assist for walker position. EOS returned pt to bed for rest. Patient will benefit from continued skilled PT interventions to address impairments and progress independence with mobility, recommending ST rehab at SNF as pt is high fall risk and has limited help at home. Acute PT will follow and progress as able.        Recommendations for follow up therapy are one component of a multi-disciplinary discharge planning process, led by the attending physician.  Recommendations may be updated based on patient status, additional functional criteria and insurance authorization.  Follow Up Recommendations Skilled nursing-short term rehab (<3 hours/day) Can patient physically be transported by private vehicle: No    Assistance Recommended at Discharge Intermittent Supervision/Assistance  Patient can return home with the following  A little help with  walking and/or transfers;A little help with bathing/dressing/bathroom;Assistance with cooking/housework;Assist for transportation;Help with stairs or ramp for entrance    Equipment Recommendations None recommended by PT  Recommendations for Other Services       Functional Status Assessment Patient has had a recent decline in their functional status and demonstrates the ability to make significant improvements in function in a reasonable and predictable amount of time.     Precautions / Restrictions Precautions Precautions: Fall Restrictions Weight Bearing Restrictions: No      Mobility  Bed Mobility Overal bed mobility: Needs Assistance Bed Mobility: Supine to Sit, Sit to Supine     Supine to sit: Min assist, HOB elevated Sit to supine: Min assist   General bed mobility comments: min assist to bring bil LE's off EOB and cues to use bed rail to initiate trunk raise with assist to fully raise trunk. EOS pt able to lower head to bed and assist needed to raise LE's off EOB.    Transfers Overall transfer level: Needs assistance Equipment used: Rolling walker (2 wheels) Transfers: Sit to/from Stand Sit to Stand: Min assist           General transfer comment: min assist to power up from EOB, cues for hand placement. pt demonstrated safe reach back to control lower to toilet and use of grab bar for rise from toilet.    Ambulation/Gait Ambulation/Gait assistance: Min assist Gait Distance (Feet): 30 Feet Assistive device: Rolling walker (2 wheels) Gait Pattern/deviations: Step-through pattern, Decreased stride length, Shuffle Gait velocity: decr        Stairs            Wheelchair Mobility    Modified Rankin (Stroke  Patients Only)       Balance Overall balance assessment: Needs assistance Sitting-balance support: No upper extremity supported, Feet supported Sitting balance-Leahy Scale: Fair     Standing balance support: Reliant on assistive device for  balance, During functional activity Standing balance-Leahy Scale: Poor                               Pertinent Vitals/Pain Pain Assessment Pain Assessment: No/denies pain Faces Pain Scale: Hurts even more Pain Location: BLE Pain Descriptors / Indicators: Grimacing, Discomfort, Sore    Home Living Family/patient expects to be discharged to:: Skilled nursing facility Living Arrangements: Alone Available Help at Discharge: Neighbor;Available PRN/intermittently Type of Home: Apartment Home Access: Stairs to enter   Entrance Stairs-Number of Steps: 1   Home Layout: One level Home Equipment: Conservation officer, nature (2 wheels)      Prior Function Prior Level of Function : Needs assist       Physical Assist : Mobility (physical);ADLs (physical)   ADLs (physical): Bathing;Dressing;IADLs Mobility Comments: uses walker, reports being very fearful of falling ADLs Comments: Patient reports being independent. patient had help from daughter in past for ADLs and IADLs. noted that daughter has passed since last admission     Hand Dominance   Dominant Hand: Right    Extremity/Trunk Assessment   Upper Extremity Assessment Upper Extremity Assessment: Defer to OT evaluation    Lower Extremity Assessment Lower Extremity Assessment: Generalized weakness;RLE deficits/detail;LLE deficits/detail    Cervical / Trunk Assessment Cervical / Trunk Assessment: Normal;Other exceptions Cervical / Trunk Exceptions: habitus  Communication   Communication: HOH  Cognition Arousal/Alertness: Awake/alert Behavior During Therapy: WFL for tasks assessed/performed Overall Cognitive Status: Difficult to assess                                 General Comments: attempted to complete formal cognitive assessment with evaluation intrupted multiple times and patient distracted. will attempt during next session.        General Comments      Exercises     Assessment/Plan    PT  Assessment Patient needs continued PT services  PT Problem List Decreased strength;Decreased range of motion;Decreased activity tolerance;Decreased balance;Decreased mobility;Decreased skin integrity       PT Treatment Interventions DME instruction;Therapeutic exercise;Gait training;Functional mobility training;Therapeutic activities;Patient/family education    PT Goals (Current goals can be found in the Care Plan section)  Acute Rehab PT Goals Patient Stated Goal: none stated PT Goal Formulation: With patient Time For Goal Achievement: 05/13/22 Potential to Achieve Goals: Good    Frequency Min 2X/week     Co-evaluation               AM-PAC PT "6 Clicks" Mobility  Outcome Measure Help needed turning from your back to your side while in a flat bed without using bedrails?: A Little Help needed moving from lying on your back to sitting on the side of a flat bed without using bedrails?: A Little Help needed moving to and from a bed to a chair (including a wheelchair)?: A Little Help needed standing up from a chair using your arms (e.g., wheelchair or bedside chair)?: A Little Help needed to walk in hospital room?: A Little Help needed climbing 3-5 steps with a railing? : A Lot 6 Click Score: 17    End of Session Equipment Utilized During Treatment: Gait belt Activity  Tolerance: Patient tolerated treatment well;Patient limited by fatigue Patient left: with call bell/phone within reach;with bed alarm set;in bed Nurse Communication: Mobility status PT Visit Diagnosis: Other abnormalities of gait and mobility (R26.89);Difficulty in walking, not elsewhere classified (R26.2);Muscle weakness (generalized) (M62.81)    Time: [id number] PT Time Calculation (min) (ACUTE ONLY): 29 min   Charges:   PT Evaluation $PT Eval Moderate Complexity: 1 Mod PT Treatments $Gait Training: 8-22 mins        Verner Mould, DPT Acute Rehabilitation Services Office [phone]  04/29/22 2:20  PM

## 2022-04-29 NOTE — Hospital Course (Signed)
82 y.o. female with medical history significant for diastolic congestive heart failure (echo 12/2017 EF 60-65% with G1DD),  lymphedema, chronic pain syndrome, restless leg syndrome, GERD, hypertension, history of pulmonary embolism and DVT on Eliquis, who presented to [facility] ED from home via EMS after being found lying in her own urine, complaining of severe generalized weakness and bilateral lower extremity pain.   Of note, patient was recently admitted to [facility] from 2/4 until 2/7 for cellulitis of the bilateral lower extremities.  Patient was also found to be markedly weak during that hospitalization.  Patient was felt to benefit from skilled physical therapy services in a skilled nursing facility but declined and instead was discharged home on 2/7.  Upon evaluation in the emergency department on this presentation, due to patient's profound weakness the hospitalist group was then called to assess the patient for admission to the hospital.

## 2022-04-29 NOTE — Progress Notes (Incomplete)
PROGRESS NOTE   Denise Kelly  [id number] DOB: 25-Feb-1941 DOA: 04/28/2022 PCP: [name], MD   Date of Service: the patient was seen and examined on 04/30/2022  Brief Narrative:  82 y.o. female with medical history significant for diastolic congestive heart failure (echo 12/2017 EF 60-65% with G1DD),  lymphedema, chronic pain syndrome, restless leg syndrome, GERD, hypertension, history of pulmonary embolism and DVT on Eliquis, who presented to [facility] ED from home via EMS after being found lying in her own urine, complaining of severe generalized weakness and bilateral lower extremity pain.   Of note, patient was recently admitted to [facility] from 2/4 until 2/7 for cellulitis of the bilateral lower extremities.  Patient was also found to be markedly weak during that hospitalization.  Patient was felt to benefit from skilled physical therapy services in a skilled nursing facility but declined and instead was discharged home on 2/7.  Upon evaluation in the emergency department on this presentation, due to patient's profound weakness the hospitalist group was then called to assess the patient for admission to the hospital.   Assessment and Plan: * Generalized weakness Severe generalized weakness leading to patient being found lying in her own urine at her place of residence brought in by EMS Patient was recommended to go to skilled nursing facility during recent hospitalization earlier in the month but declined and decided to go home Patient is now agreeable Weakness felt to be secondary to progressive deconditioning Will expand workup by additionally obtaining TSH, vitamin B12, COVID-19 PCR testing, CK  Right hip pain Patient complaining of severe bilateral lower extremity pain, seemingly worst in the right thigh and hip Exam however does not reveal substantial worsening of the pain with manipulation of the right hip joint or knee Patient was found down by EMS.  For this reason  we will obtain x-rays of the hips and right knee to evaluate for evidence of bony injury   Hereditary lymphedema of legs Patient possesses chronic bilateral lower extremity swelling due to lymphedema Chronic hyperpigmentation of the skin with multiple areas of skin breakdown Clinically does not appear infected Initiate Unna boots  Chronic diastolic CHF (congestive heart failure) (HCC) No clinical evidence of cardiogenic volume overload Continue home regimen of daily Lasix therapy Strict input and output monitoring Daily weights Low-sodium diet    Essential hypertension Reported history of hypertension however patient is not currently on scheduled antihypertensive therapy Patient is currently normotensive As needed intravenous antihypertensive therapy for markedly elevated blood pressures  Personal history of thromboembolic disease History of DVT and PE in 2019 Continue home regimen of apixaban  GERD without esophagitis Continuing home regimen of daily PPI therapy.   Hypokalemia Replacing with potassium chloride Evaluating for concurrent hypomagnesemia  Monitoring potassium levels with serial chemistries.     Subjective:  Patient complaining of bilateral lower extremity pain, particularly in the right hip and right thigh.  Patient describes this pain as sore in quality, moderate to severe in intensity and worse with movement.  Physical Exam:  Vitals:   04/29/22 0400 04/29/22 0742 04/29/22 1237 04/29/22 1944  BP: (!) 110/58 (!) 108/59 (!) 110/58 (!) 108/55  Pulse: 94 91 83 88  Resp: 16  18 16  $ Temp: 98.6 F (37 C) 97.9 F (36.6 C) 98.2 F (36.8 C) 98.5 F (36.9 C)  TempSrc: Oral Oral Oral Oral  SpO2: 93% 91% 96% 94%  Weight:      Height:        Constitutional: Awake  alert and oriented x3, patient in mild distress due to pain. Skin: skin hyperpigmentation of the anterior surfaces of the bilateral lower extremities with multiple areas of skin  breakdown. Eyes: Pupils are equally reactive to light.  No evidence of scleral icterus or conjunctival pallor.  ENMT: Moist mucous membranes noted.  Posterior pharynx clear of any exudate or lesions.   Respiratory: clear to auscultation bilaterally, no wheezing, no crackles. Normal respiratory effort. No accessory muscle use.  Cardiovascular: Regular rate and rhythm, no murmurs / rubs / gallops.  Marked him of the distal bilateral lower extremities.  2+ pedal pulses. No carotid bruits.  Abdomen: Abdomen is soft and nontender.  No evidence of intra-abdominal masses.  Positive bowel sounds noted in all quadrants.   Musculoskeletal: Pain with both passive and active range of motion of the right hip and right knee without crepitus.  Data Reviewed:  I have personally reviewed and interpreted labs, imaging.  Significant findings are   CBC: Recent Labs  Lab 04/23/22 0443 04/28/22 1549 04/29/22 0530  WBC 6.3 7.7 6.2  NEUTROABS  --  5.3  --   HGB 13.4 14.3 11.8*  HCT 41.8 44.4 36.0  MCV 88.4 87.9 88.5  PLT 190 188 [id number]   Basic Metabolic Panel: Recent Labs  Lab 04/23/22 0443 04/28/22 1549 04/29/22 0530  NA 137 138 139  K 3.9 3.7 3.4*  CL 98 100 100  CO2 30 30 29  $ GLUCOSE 115* 121* 113*  BUN 20 12 17  $ CREATININE 0.74 0.63 0.75  CALCIUM 8.6* 8.5* 8.3*  MG 1.9  --  2.1  PHOS  --   --  4.6   GFR: Estimated Creatinine Clearance: 64.3 mL/min (by C-G formula based on SCr of 0.75 mg/dL). Liver Function Tests: Recent Labs  Lab 04/28/22 1549  AST 28  ALT 19  ALKPHOS 91  BILITOT 0.7  PROT 6.7  ALBUMIN 3.4*      Code Status:  Full code.  Code status decision has been confirmed with: patient Family Communication: deferred    Severity of Illness:  The appropriate patient status for this patient is OBSERVATION. Observation status is judged to be reasonable and necessary in order to provide the required intensity of service to ensure the patient's safety. The patient's  presenting symptoms, physical exam findings, and initial radiographic and laboratory data in the context of their medical condition is felt to place them at decreased risk for further clinical deterioration. Furthermore, it is anticipated that the patient will be medically stable for discharge from the hospital within 2 midnights of admission.   Time spent:  50 minutes  Author:  Vernelle Emerald MD  04/30/2022 12:44 AM

## 2022-04-29 NOTE — Progress Notes (Signed)
Pt is current A&Ox3 with forgetful and disoriented to situation. Pt refuse to be turned and refuse skin check as well, because of scared to fall on the floor. Two RN have reassured pt, her safety is priority and told her she is not going to fall during the skin check and turning. Pt would like to wait until a later time when she is ready. On call provider Gershon Cull NP has been notified. RN will keep monitoring.

## 2022-04-29 NOTE — Plan of Care (Signed)
Discuss and review plan of care with patient

## 2022-04-30 ENCOUNTER — Observation Stay (HOSPITAL_COMMUNITY): Payer: Medicare Other

## 2022-04-30 DIAGNOSIS — M160 Bilateral primary osteoarthritis of hip: Secondary | ICD-10-CM | POA: Diagnosis not present

## 2022-04-30 DIAGNOSIS — K219 Gastro-esophageal reflux disease without esophagitis: Secondary | ICD-10-CM | POA: Diagnosis present

## 2022-04-30 DIAGNOSIS — Q820 Hereditary lymphedema: Secondary | ICD-10-CM

## 2022-04-30 DIAGNOSIS — I5032 Chronic diastolic (congestive) heart failure: Secondary | ICD-10-CM | POA: Diagnosis present

## 2022-04-30 DIAGNOSIS — R531 Weakness: Secondary | ICD-10-CM | POA: Diagnosis not present

## 2022-04-30 DIAGNOSIS — Z96651 Presence of right artificial knee joint: Secondary | ICD-10-CM | POA: Diagnosis not present

## 2022-04-30 DIAGNOSIS — Z9889 Other specified postprocedural states: Secondary | ICD-10-CM | POA: Diagnosis not present

## 2022-04-30 DIAGNOSIS — Z471 Aftercare following joint replacement surgery: Secondary | ICD-10-CM | POA: Diagnosis not present

## 2022-04-30 DIAGNOSIS — M25551 Pain in right hip: Secondary | ICD-10-CM | POA: Diagnosis present

## 2022-04-30 DIAGNOSIS — S72002A Fracture of unspecified part of neck of left femur, initial encounter for closed fracture: Secondary | ICD-10-CM | POA: Diagnosis not present

## 2022-04-30 LAB — COMPREHENSIVE METABOLIC PANEL
ALT: 16 U/L (ref 0–44)
AST: 23 U/L (ref 15–41)
Albumin: 2.7 g/dL — ABNORMAL LOW (ref 3.5–5.0)
Alkaline Phosphatase: 64 U/L (ref 38–126)
Anion gap: 10 (ref 5–15)
BUN: 16 mg/dL (ref 8–23)
CO2: 29 mmol/L (ref 22–32)
Calcium: 8.1 mg/dL — ABNORMAL LOW (ref 8.9–10.3)
Chloride: 100 mmol/L (ref 98–111)
Creatinine, Ser: 0.78 mg/dL (ref 0.44–1.00)
GFR, Estimated: >60 mL/min (ref >60)
Glucose, Bld: 114 mg/dL — ABNORMAL HIGH (ref 70–99)
Potassium: 3.7 mmol/L (ref 3.5–5.1)
Sodium: 139 mmol/L (ref 135–145)
Total Bilirubin: 0.5 mg/dL (ref 0.3–1.2)
Total Protein: 5.6 g/dL — ABNORMAL LOW (ref 6.5–8.1)

## 2022-04-30 LAB — CBC WITH DIFFERENTIAL/PLATELET
Abs Immature Granulocytes: 0.01 10*3/uL (ref 0.00–0.07)
Basophils Absolute: 0.1 10*3/uL (ref 0.0–0.1)
Basophils Relative: 1 %
Eosinophils Absolute: 0.2 10*3/uL (ref 0.0–0.5)
Eosinophils Relative: 3 %
HCT: 39.7 % (ref 36.0–46.0)
Hemoglobin: 12.7 g/dL (ref 12.0–15.0)
Immature Granulocytes: 0 %
Lymphocytes Relative: 45 %
Lymphs Abs: 2.5 10*3/uL (ref 0.7–4.0)
MCH: 28.3 pg (ref 26.0–34.0)
MCHC: 32 g/dL (ref 30.0–36.0)
MCV: 88.6 fL (ref 80.0–100.0)
Monocytes Absolute: 0.7 10*3/uL (ref 0.1–1.0)
Monocytes Relative: 13 %
Neutro Abs: 2 10*3/uL (ref 1.7–7.7)
Neutrophils Relative %: 38 %
Platelets: 158 10*3/uL (ref 150–400)
RBC: 4.48 MIL/uL (ref 3.87–5.11)
RDW: 19.3 % — ABNORMAL HIGH (ref 11.5–15.5)
WBC: 5.5 10*3/uL (ref 4.0–10.5)
nRBC: 0 % (ref 0.0–0.2)

## 2022-04-30 LAB — CK: Total CK: 61 U/L (ref 38–234)

## 2022-04-30 LAB — MAGNESIUM: Magnesium: 1.9 mg/dL (ref 1.7–2.4)

## 2022-04-30 MED ORDER — LIDOCAINE 5 % EX PTCH
1.0000 | MEDICATED_PATCH | CUTANEOUS | Status: DC
  Administered 2022-04-30: 1 via TRANSDERMAL
  Filled 2022-04-30: qty 1

## 2022-04-30 MED ORDER — OXYCODONE HCL 5 MG PO TABS
5.0000 mg | ORAL_TABLET | Freq: Once | ORAL | Status: AC
  Administered 2022-04-30: 5 mg via ORAL
  Filled 2022-04-30: qty 1

## 2022-04-30 MED ORDER — HYDRALAZINE HCL 20 MG/ML IJ SOLN: 10.0000 mg | Freq: Four times a day (QID) | INTRAMUSCULAR | Status: DC | PRN

## 2022-04-30 MED ORDER — VITAMIN B-12 1000 MCG PO TABS
1000.0000 ug | ORAL_TABLET | Freq: Every day | ORAL | Status: DC
  Administered 2022-04-30 – 2022-05-02 (×3): 1000 ug via ORAL
  Filled 2022-04-30 (×3): qty 1

## 2022-04-30 MED ORDER — THIAMINE HCL 100 MG/ML IJ SOLN
500.0000 mg | Freq: Three times a day (TID) | INTRAVENOUS | Status: DC
  Administered 2022-04-30 – 2022-05-02 (×6): 500 mg via INTRAVENOUS
  Filled 2022-04-30 (×9): qty 5

## 2022-04-30 MED ORDER — POLYVINYL ALCOHOL 1.4 % OP SOLN
1.0000 [drp] | OPHTHALMIC | Status: DC | PRN
  Filled 2022-04-30: qty 15

## 2022-04-30 MED ORDER — PANTOPRAZOLE SODIUM 40 MG PO TBEC
40.0000 mg | DELAYED_RELEASE_TABLET | Freq: Every day | ORAL | Status: DC
  Administered 2022-04-30 – 2022-05-02 (×3): 40 mg via ORAL
  Filled 2022-04-30 (×3): qty 1

## 2022-04-30 NOTE — Assessment & Plan Note (Addendum)
Patient possesses chronic bilateral lower extremity swelling due to lymphedema Chronic hyperpigmentation of the skin with multiple areas of skin breakdown Clinically does not appear infected Initiate Unna boots

## 2022-04-30 NOTE — TOC Progression Note (Addendum)
Transition of Care [facility]) - Progression Note    Patient Details  Name: Denise Kelly MRN: [id number] Date of Birth: 1940/04/18  [facility]) CM/SW [place], LCSW Phone Number: 04/30/2022, 1:25 PM  Clinical Narrative:    Met with pt to discuss SNF bed offers. [facility] has no beds available and unable to accept pt. Pt accepted SNF placement at [facility]. Insurance Josem Kaufmann has been approved from 2/15 to 2/19. [facility] to know if will have bed available tomorrow following bed meeting.   Completed APS reports with Mannie Stabile due to concerns for pt's capacity to care for self. Pt reports only eating progressive soup due to not being able to cook. Pt struggles to take medications on her own and requires ongoing prompting. Pt has no family/supports. Unknown living environment. TOC is to receive call with outcome of report.    Update 3:30pm- CSW received call from Mannie Stabile with APS who shared that pt's APS report was screened out due to pt going to SNF at discharge, not knowing how long she will be at SNF, and what pt's capacity will be once discharged from SNF.    Expected Discharge Plan: Skilled Nursing Facility Barriers to Discharge: Barriers Resolved  Expected Discharge Plan and Services In-house Referral: Clinical Social Work Discharge Planning Services: NA Post Acute Care Choice: [place] Living arrangements for the past 2 months: Apartment                 DME Arranged: N/A DME Agency: NA                   Social Determinants of Health (SDOH) Interventions SDOH Screenings   Food Insecurity: Food Insecurity Present (04/29/2022)  Housing: Low Risk  (04/29/2022)  Transportation Needs: Unmet Transportation Needs (04/29/2022)  Utilities: Not At Risk (04/29/2022)  Depression (PHQ2-9): Low Risk  (01/09/2019)  Tobacco Use: Medium Risk (04/29/2022)    Readmission Risk Interventions     No data to display

## 2022-04-30 NOTE — Evaluation (Signed)
Clinical/Bedside Swallow Evaluation Patient Details  Name: TASHIANA SIEGLE MRN: [id number] Date of Birth: [birth year]  Today's Date: 04/30/2022 Time: SLP Start Time (ACUTE ONLY): 48 SLP Stop Time (ACUTE ONLY): 1640 SLP Time Calculation (min) (ACUTE ONLY): 20 min  Past Medical History:  Past Medical History:  Diagnosis Date   Alcohol abuse    ETOH and xanax   Anxiety    Panic attacks    Arthritis    Cancer ([place])    skin- basal , squamous , melonoma- right hand   Cellulitis    right leg   Complication of anesthesia 2009   "felt drunk for a week after"- AVM- both times- felt drunk   Depression    Encephalopathy    GERD (gastroesophageal reflux disease)    HOH (hard of hearing)    HOH (hard of hearing)    Hypertension    not on mediacations   Palpitations    PONV (postoperative nausea and vomiting)    Spondylolisthesis of lumbosacral region    UTI (urinary tract infection)    frequently   Past Surgical History:  Past Surgical History:  Procedure Laterality Date   BIOPSY  01/24/2018   Procedure: BIOPSY;  Surgeon: Ronnette Juniper, MD;  Location: [facility] ENDOSCOPY;  Service: Gastroenterology;;   BREAST SURGERY Right    2 breast biopsies   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   COLONOSCOPY  2011   polyps   ESOPHAGOGASTRODUODENOSCOPY (EGD) WITH PROPOFOL N/A 01/24/2018   Procedure: ESOPHAGOGASTRODUODENOSCOPY (EGD) WITH PROPOFOL;  Surgeon: Ronnette Juniper, MD;  Location: [place];  Service: Gastroenterology;  Laterality: N/A;   EYE SURGERY Bilateral    cataracts   INTRAMEDULLARY (IM) NAIL INTERTROCHANTERIC Left 11/29/2015   Procedure: LEFT HIP   NAIL;  Surgeon: Renette Butters, MD;  Location: [place];  Service: Orthopedics;  Laterality: Left;   JOINT REPLACEMENT Left 09/2009   knee   LUMBAR FUSION  07/17/2016   Lumbar four-five Posterior lumbar interbody fusion (N/A)   TONSILLECTOMY     TOTAL KNEE ARTHROPLASTY Right 01/02/2014   Procedure: TOTAL KNEE ARTHROPLASTY;   Surgeon: [name], MD;  Location: [place];  Service: Orthopedics;  Laterality: Right;   TUBAL LIGATION     HPI:  Patient is a 82 year old female who presented with pain and persistent edema in bilateral lower extremities. Patient has had prior admission - from 2/4 to 2/7 with UTI and cellulitis with patient transitioning home after declining to go SNF. PMH:  chronic pain syndrome, medical noncompliance, R TKA, L TKA, lumbar fusion, CHF, GERD.  Pt was found at home soaked in urine per notes.  She has also suffered from life losses including her father passing when she was 52 years old and her only child commiting suicide approx 18 months ago.  Prior brain ct showed Atrophy and chronic small-vessel white matter ischemic changes    Assessment / Plan / Recommendation  Clinical Impression  Patient presents with normal oropharyngeal swallow ability based on clinical swallow evaluation. No indication of airway compromise nor dysphagia across all po trials despite pt HOB reclined.  Adequate mastication with full clearance noted of approx 8 saltine crackers and 3 ounces soda.  She declined to consume any more po - stating she wanted to wait for dinner.   Pt denies h/o dysphagia - reports she eats Progressive soup at home - suspect due to convenience. SLP assisted pt to order her diner meal for approx 1715 tonight and informed  her how to order from menu.     Pt spoke to SLP regarding the loss of her daughter - causing SLP to question component of depression exacerbating potential cognitive linguistic deficits.  Per SLP conversation with RN and OT,  pt is not recalling that she asked for pain medicine and received them and believes she is in touch with a tennis player. She did verbalize independently the plan to dc to rehab after hospital coarse.    Recommend follow up at SNF to address her cognition to maximize her rehab with goal to return to independence.  No SLP follow up.     SLP Visit Diagnosis:  Dysphagia, unspecified (R13.10)    Aspiration Risk  No limitations    Diet Recommendation Regular;Thin liquid   Liquid Administration via: Cup;Straw Medication Administration: Whole meds with liquid Supervision: Patient able to self feed Compensations: Slow rate;Small sips/bites Postural Changes: Seated upright at 90 degrees;Remain upright for at least 30 minutes after po intake    Other  Recommendations Oral Care Recommendations: Oral care BID    Recommendations for follow up therapy are one component of a multi-disciplinary discharge planning process, led by the attending physician.  Recommendations may be updated based on patient status, additional functional criteria and insurance authorization.  Follow up Recommendations No SLP follow up      Assistance Recommended at Discharge  full  Functional Status Assessment Patient has not had a recent decline in their functional status  Frequency and Duration   N/a         Prognosis Barriers to Reach Goals: Motivation      Swallow Study   General Date of Onset: 04/30/22 HPI: Patient is a 82 year old female who presented with pain and persistent edema in bilateral lower extremities. Patient has had prior admission - from 2/4 to 2/7 with UTI and cellulitis with patient transitioning home after declining to go SNF. PMH:  chronic pain syndrome, medical noncompliance, R TKA, L TKA, lumbar fusion, CHF, GERD.  Pt was found at home soaked in urine per notes.  She has also suffered from life losses including her father passing when she was 74 years old and her only child commiting suicide approx 18 months ago.  Prior brain ct showed Atrophy and chronic small-vessel white matter ischemic changes Type of Study: Bedside Swallow Evaluation Previous Swallow Assessment: none Diet Prior to this Study: Regular;Thin liquids (Level 0) Temperature Spikes Noted: No Respiratory Status: Room air History of Recent Intubation: No Behavior/Cognition:  Alert;Cooperative;Pleasant mood Oral Cavity Assessment: Within Functional Limits Oral Care Completed by SLP: No Oral Cavity - Dentition: Poor condition Vision: Functional for self-feeding Self-Feeding Abilities: Able to feed self Patient Positioning: Partially reclined (declined to have [place] elevated) Baseline Vocal Quality: Normal Volitional Cough: Strong Volitional Swallow: Able to elicit    Oral/Motor/Sensory Function Overall Oral Motor/Sensory Function: Within functional limits   Ice Chips Ice chips: Not tested   Thin Liquid Thin Liquid: Within functional limits Presentation: Straw;Self Fed    Nectar Thick Nectar Thick Liquid: Not tested   Honey Thick Honey Thick Liquid: Not tested   Puree Puree: Not tested   Solid     Solid: Within functional limits Presentation: Self Fredirick Lathe 04/30/2022,5:54 PM  Kathleen Lime, MS [place] Office [phone]

## 2022-04-30 NOTE — Progress Notes (Addendum)
The patient came down to CT accompanied by RN and repeatedly refused the scan and stated "I just want to go home. I don't want to do this" Explained to patient the importance of the exam. Patient continued to refuse exam Florencia Reasons MD messaged about patient refusal

## 2022-04-30 NOTE — Assessment & Plan Note (Signed)
History of DVT and PE in 2019 Continue home regimen of apixaban

## 2022-04-30 NOTE — Assessment & Plan Note (Signed)
Severe generalized weakness leading to patient being found lying in her own urine at her place of residence brought in by EMS Patient was recommended to go to skilled nursing facility during recent hospitalization earlier in the month but declined and decided to go home Patient is now agreeable Weakness felt to be secondary to progressive deconditioning Will expand workup by additionally obtaining TSH, vitamin B12, COVID-19 PCR testing, CK

## 2022-04-30 NOTE — Assessment & Plan Note (Signed)
Continuing home regimen of daily PPI therapy.  

## 2022-04-30 NOTE — Assessment & Plan Note (Signed)
· 

## 2022-04-30 NOTE — Assessment & Plan Note (Signed)
Reported history of hypertension however patient is not currently on scheduled antihypertensive therapy Patient is currently normotensive As needed intravenous antihypertensive therapy for markedly elevated blood pressures

## 2022-04-30 NOTE — Progress Notes (Signed)
PROGRESS NOTE    Denise Kelly  [id number] DOB: March 13, 1941 DOA: 04/28/2022 PCP: [name], MD     Brief Narrative:   82 y.o. female with medical history significant for diastolic congestive heart failure (echo 12/2017 EF 60-65% with G1DD),  lymphedema, chronic pain syndrome, restless leg syndrome, GERD, hypertension, history of pulmonary embolism and DVT on Eliquis, who presented to [facility] ED from home via EMS after being found lying in her own urine, complaining of severe generalized weakness and bilateral lower extremity pain.   Of note, patient was recently admitted to [facility] from 2/4 until 2/7 for cellulitis of the bilateral lower extremities.  Patient was also found to be markedly weak during that hospitalization.  Patient was felt to benefit from skilled physical therapy services in a skilled nursing facility but declined and instead was discharged home on 2/7.  Upon evaluation in the emergency department on this presentation, due to patient's profound weakness the hospitalist group was then called to assess the patient for admission to the hospital, she now is agreeing to snf placement   Subjective:  Right hip pain, bilateral lower extremity chronic venous statsis changes  Per OT , patient stateed her boyfriend named "Lindwood Qua the tennis player" who communicates with her on her phone.   Assessment & Plan:  Principal Problem:   Generalized weakness Active Problems:   Right hip pain   Hereditary lymphedema of legs   Chronic diastolic CHF (congestive heart failure) (HCC)   Essential hypertension   Personal history of thromboembolic disease   GERD without esophagitis   Hypokalemia   LAFB (left anterior fascicular block)    Assessment and Plan:   * Generalized weakness Severe generalized weakness leading to patient being found lying in her own urine at her place of residence brought in by EMS Patient was recommended to go to skilled nursing facility  during recent hospitalization earlier in the month but declined and decided to go home Patient is now agreeable Weakness felt to be secondary to progressive deconditioning Ck, TSH, folic acid unremarkable ,COVID-19 PCR testing negative -vitamin B12 230 , start B12 supplement   Right hip pain Patient complaining of severe bilateral lower extremity pain, seemingly worst in the right thigh and hip Exam however does not reveal substantial worsening of the pain with manipulation of the right hip joint or knee Patient was found down by EMS.   x-rays of the hips and right knee no acute findings   Hallucination? No leukocytosis, no fever, ua no bacteremia, cxr on acute finding on 2/13 Will get CT head, will start high dose thiamine due to h/o alcohol use Unsure when was the last drink, family states patient has not had ways to get alcohol since September, patient reports last drink was many years ago   Chronic  lymphedema of legs Patient possesses chronic bilateral lower extremity swelling due to lymphedema Chronic hyperpigmentation of the skin with multiple areas of skin breakdown Clinically does not appear infected Initiate Unna boots  Chronic diastolic CHF (congestive heart failure) (HCC) No clinical evidence of cardiogenic volume overload Continue home regimen of daily Lasix therapy Strict input and output monitoring Daily weights Low-sodium diet   Personal history of thromboembolic disease History of DVT and PE in 2019 Continue home regimen of apixaban  GERD without esophagitis Continuing home regimen of daily PPI therapy.   Hypokalemia Replacing with potassium chloride Evaluating for concurrent hypomagnesemia  Monitoring potassium levels with serial chemistries.     I  have Reviewed nursing notes, Vitals, pain scores, I/o's, Lab results and  imaging results since pt's last encounter, details please see discussion above  I ordered the following labs:  Unresulted Labs  (From admission, onward)     Start     Ordered   05/01/22 0500  CBC with Differential/Platelet  Tomorrow morning,   R       Question:  Specimen collection method  Answer:  Lab=Lab collect   04/30/22 1510   05/01/22 [id number]  Basic metabolic panel  Tomorrow morning,   R       Question:  Specimen collection method  Answer:  Lab=Lab collect   04/30/22 1510   05/01/22 0500  Magnesium  Tomorrow morning,   R       Question:  Specimen collection method  Answer:  Lab=Lab collect   04/30/22 1510   05/01/22 0500  Phosphorus  Tomorrow morning,   R       Question:  Specimen collection method  Answer:  Lab=Lab collect   04/30/22 1510             DVT prophylaxis:  apixaban (ELIQUIS) tablet 5 mg   Code Status:   Code Status: Full Code  Family Communication: None at bedside Disposition:   Status is: Observation   Dispo: The patient is from: Home              Anticipated d/c is to: SNF              Anticipated d/c date is: pending mental status , currently on iv thiamine, follow up on Ct head, speech eval  Antimicrobials:    Anti-infectives (From admission, onward)    Start     Dose/Rate Route Frequency Ordered Stop   04/28/22 2330  cephALEXin (KEFLEX) capsule 500 mg        500 mg Oral  Once 04/28/22 2322 04/28/22 2351          Objective: Vitals:   04/29/22 1237 04/29/22 1944 04/30/22 0558 04/30/22 1442  BP: (!) 110/58 (!) 108/55 122/67 109/62  Pulse: 83 88 89 83  Resp: 18 16 16 18  $ Temp: 98.2 F (36.8 C) 98.5 F (36.9 C) 98.6 F (37 C) 97.7 F (36.5 C)  TempSrc: Oral Oral  Oral  SpO2: 96% 94% 94% 99%  Weight:      Height:        Intake/Output Summary (Last 24 hours) at 04/30/2022 1510 Last data filed at 04/30/2022 1330 Gross per 24 hour  Intake 480 ml  Output 3050 ml  Net -2570 ml   Filed Weights   04/28/22 1448 04/29/22 0054  Weight: 86.6 kg 89 kg    Examination:  General exam: frail, chronically ill , aaox3 Respiratory system: Clear to auscultation.  Respiratory effort normal. Cardiovascular system:  RRR.  Gastrointestinal system: Abdomen is nondistended, soft and nontender.  Normal bowel sounds heard. Central nervous system: Alert and oriented. No focal neurological deficits. Extremities:  chronic venous stasis changes bilateral lower extremity edema with mild erythema Skin: as above Psychiatry: Judgement and insight appear normal. Mood & affect appropriate. Intermittent hallucination?    Data Reviewed: I have personally reviewed  labs and visualized  imaging studies since the last encounter and formulate the plan        Scheduled Meds:  apixaban  5 mg Oral BID   vitamin B-12  1,000 mcg Oral Daily   folic acid  1 mg Oral Daily   furosemide  40 mg Oral  Daily   multivitamin with minerals  1 tablet Oral Daily   pantoprazole  40 mg Oral Daily   pramipexole  0.125 mg Oral Daily   Continuous Infusions:  thiamine (VITAMIN B1) injection       LOS: 0 days   Time spent: 32mns  FFlorencia Reasons MD PhD FACP [facility]  Available via Epic secure chat 7am-7pm for nonurgent issues Please page for urgent issues To page the attending provider between 7A-7P or the covering provider during after hours 7P-7A, please log into the web site [email or web address] and access using universal [place] password for that web site. If you do not have the password, please call the hospital operator.    04/30/2022, 3:10 PM

## 2022-04-30 NOTE — Progress Notes (Signed)
Occupational Therapy Treatment Patient Details Name: Denise Kelly MRN: [id number] DOB: Sep 23, 1940 Today's Date: 04/30/2022   History of present illness Patient is a 82 year old female who presented with pain and persistent edema in bilateral lower extremities. Of note patient was admitted from 2/4 to 2/7 with UTI and cellulitis with patient transitioning home after declining to go SNF. PMH:  chronic pain syndrome, medical noncompliance, R TKA, L TKA, lumbar fusion.   OT comments  Patient was easily distracted with increased coughing and incontinence episodes during session impacting ability to complete Mini Mental State Examination. Patient was able to transfer with min guard with increased time with increased pain noted in RLE. Patient remains unsafe to transition home alone at this time. Patient would continue to benefit from short term rehab to increased strength, and functional activity tolerance to improve ability to complete ADLs.   Recommendations for follow up therapy are one component of a multi-disciplinary discharge planning process, led by the attending physician.  Recommendations may be updated based on patient status, additional functional criteria and insurance authorization.    Follow Up Recommendations  Skilled nursing-short term rehab (<3 hours/day)     Assistance Recommended at Discharge Frequent or constant Supervision/Assistance  Patient can return home with the following  A little help with walking and/or transfers;A lot of help with bathing/dressing/bathroom;Assistance with cooking/housework;Direct supervision/assist for medications management;Assist for transportation;Help with stairs or ramp for entrance;Direct supervision/assist for financial management   Equipment Recommendations  None recommended by OT    Recommendations for Other Services      Precautions / Restrictions Precautions Precautions: Fall Precaution Comments: back  precautions Restrictions Weight Bearing Restrictions: No       Mobility Bed Mobility Overal bed mobility: Needs Assistance Bed Mobility: Supine to Sit     Supine to sit: Min assist, HOB elevated     General bed mobility comments: min assist to bring bil LE's off EOB and cues to use bed rail to initiate trunk raise with assist to fully raise trunk.             ADL either performed or assessed with clinical judgement   ADL Overall ADL's : Needs assistance/impaired     Grooming: Wash/dry face;Set up;Sitting Grooming Details (indicate cue type and reason): in recliner Upper Body Bathing: Minimal assistance;Sitting   Lower Body Bathing: Moderate assistance;Sitting/lateral leans Lower Body Bathing Details (indicate cue type and reason): with encouragement to reach what she could. Upper Body Dressing : Minimal assistance;Sitting   Lower Body Dressing: Maximal assistance Lower Body Dressing Details (indicate cue type and reason): donning socks x2 with increased pain in BLE and noted incontience causing change in footwear multiple times during session. Toilet Transfer: Minimal assistance;Ambulation;Rolling walker (2 wheels) Toilet Transfer Details (indicate cue type and reason): with education on proper hand placement. patient transferred from bed to reclienr and reclienr to 3 in1. NT in room with patient at end of session Toileting- Clothing Manipulation and Hygiene: Moderate assistance;Sit to/from stand Toileting - Clothing Manipulation Details (indicate cue type and reason): incontinent of urine x 2 during session     Functional mobility during ADLs: Minimal assistance        Cognition Arousal/Alertness: Awake/alert Behavior During Therapy: WFL for tasks assessed/performed Overall Cognitive Status: Difficult to assess    Patient was also upset that her boyfriend "Lindwood Qua" had not contacted her today. Patient reported that he does not visit her and does not know  where she lives. MD made aware. Patient  endorsed that she would not share her address online.          General Comments: attempted to complete Mini Mental State Examination with patient on this date. patietn easily distracted with increased cough during session. patient also noted to have had episode of incontinence during session with patient unable to finish assessment.                   Pertinent Vitals/ Pain       Pain Assessment Pain Assessment: Faces Faces Pain Scale: Hurts even more Pain Location: BLE R more than L Pain Descriptors / Indicators: Grimacing, Discomfort, Sore, Moaning Pain Intervention(s): Limited activity within patient's tolerance, Repositioned, Monitored during session         Frequency  Min 2X/week        Progress Toward Goals  OT Goals(current goals can now be found in the care plan section)  Progress towards OT goals: Progressing toward goals     Plan Discharge plan remains appropriate       AM-PAC OT "6 Clicks" Daily Activity     Outcome Measure   Help from another person eating meals?: A Little Help from another person taking care of personal grooming?: A Little Help from another person toileting, which includes using toliet, bedpan, or urinal?: A Lot Help from another person bathing (including washing, rinsing, drying)?: A Lot Help from another person to put on and taking off regular upper body clothing?: A Little Help from another person to put on and taking off regular lower body clothing?: A Lot 6 Click Score: 15    End of Session Equipment Utilized During Treatment: Rolling walker (2 wheels);Gait belt  OT Visit Diagnosis: Unsteadiness on feet (R26.81);Other abnormalities of gait and mobility (R26.89);Muscle weakness (generalized) (M62.81);Pain   Activity Tolerance Patient tolerated treatment well   Patient Left Other (comment) (on 3 in 1 commode with NT after 2 incontinence episode)   Nurse Communication Other (comment)  (request for cough medicine)        Time: [id number] OT Time Calculation (min): 46 min  Charges: OT General Charges $OT Visit: 1 Visit OT Treatments $Self Care/Home Management : 38-52 mins  Rennie Plowman, MS Acute Rehabilitation Department Office# [phone]   Willa Rough 04/30/2022, 2:27 PM

## 2022-04-30 NOTE — Assessment & Plan Note (Signed)
Patient complaining of severe bilateral lower extremity pain, seemingly worst in the right thigh and hip Exam however does not reveal substantial worsening of the pain with manipulation of the right hip joint or knee Patient was found down by EMS.  For this reason we will obtain x-rays of the hips and right knee to evaluate for evidence of bony injury

## 2022-04-30 NOTE — Assessment & Plan Note (Signed)
No clinical evidence of cardiogenic volume overload Continue home regimen of daily Lasix therapy Strict input and output monitoring Daily weights Low-sodium diet

## 2022-05-01 DIAGNOSIS — Z5320 Procedure and treatment not carried out because of patient's decision for unspecified reasons: Secondary | ICD-10-CM | POA: Diagnosis present

## 2022-05-01 DIAGNOSIS — I110 Hypertensive heart disease with heart failure: Secondary | ICD-10-CM | POA: Diagnosis present

## 2022-05-01 DIAGNOSIS — Z1152 Encounter for screening for COVID-19: Secondary | ICD-10-CM | POA: Diagnosis not present

## 2022-05-01 DIAGNOSIS — M25551 Pain in right hip: Secondary | ICD-10-CM | POA: Diagnosis present

## 2022-05-01 DIAGNOSIS — M25552 Pain in left hip: Secondary | ICD-10-CM | POA: Diagnosis present

## 2022-05-01 DIAGNOSIS — K5909 Other constipation: Secondary | ICD-10-CM | POA: Diagnosis present

## 2022-05-01 DIAGNOSIS — Z87440 Personal history of urinary (tract) infections: Secondary | ICD-10-CM | POA: Diagnosis not present

## 2022-05-01 DIAGNOSIS — R627 Adult failure to thrive: Secondary | ICD-10-CM | POA: Diagnosis present

## 2022-05-01 DIAGNOSIS — K219 Gastro-esophageal reflux disease without esophagitis: Secondary | ICD-10-CM | POA: Diagnosis present

## 2022-05-01 DIAGNOSIS — R531 Weakness: Secondary | ICD-10-CM | POA: Diagnosis not present

## 2022-05-01 DIAGNOSIS — Z87891 Personal history of nicotine dependence: Secondary | ICD-10-CM | POA: Diagnosis not present

## 2022-05-01 DIAGNOSIS — Z7901 Long term (current) use of anticoagulants: Secondary | ICD-10-CM | POA: Diagnosis not present

## 2022-05-01 DIAGNOSIS — Q820 Hereditary lymphedema: Secondary | ICD-10-CM | POA: Diagnosis not present

## 2022-05-01 DIAGNOSIS — I5032 Chronic diastolic (congestive) heart failure: Secondary | ICD-10-CM | POA: Diagnosis present

## 2022-05-01 DIAGNOSIS — G2581 Restless legs syndrome: Secondary | ICD-10-CM | POA: Diagnosis present

## 2022-05-01 DIAGNOSIS — I878 Other specified disorders of veins: Secondary | ICD-10-CM | POA: Diagnosis present

## 2022-05-01 DIAGNOSIS — I872 Venous insufficiency (chronic) (peripheral): Secondary | ICD-10-CM | POA: Diagnosis present

## 2022-05-01 DIAGNOSIS — G894 Chronic pain syndrome: Secondary | ICD-10-CM | POA: Diagnosis present

## 2022-05-01 DIAGNOSIS — Z79899 Other long term (current) drug therapy: Secondary | ICD-10-CM | POA: Diagnosis not present

## 2022-05-01 DIAGNOSIS — R443 Hallucinations, unspecified: Secondary | ICD-10-CM | POA: Diagnosis not present

## 2022-05-01 DIAGNOSIS — Z86711 Personal history of pulmonary embolism: Secondary | ICD-10-CM | POA: Diagnosis not present

## 2022-05-01 DIAGNOSIS — E876 Hypokalemia: Secondary | ICD-10-CM | POA: Diagnosis not present

## 2022-05-01 DIAGNOSIS — I480 Paroxysmal atrial fibrillation: Secondary | ICD-10-CM | POA: Diagnosis present

## 2022-05-01 DIAGNOSIS — Z86718 Personal history of other venous thrombosis and embolism: Secondary | ICD-10-CM | POA: Diagnosis not present

## 2022-05-01 DIAGNOSIS — I444 Left anterior fascicular block: Secondary | ICD-10-CM | POA: Diagnosis present

## 2022-05-01 DIAGNOSIS — R609 Edema, unspecified: Secondary | ICD-10-CM | POA: Diagnosis present

## 2022-05-01 LAB — CBC WITH DIFFERENTIAL/PLATELET
Abs Immature Granulocytes: 0.02 10*3/uL (ref 0.00–0.07)
Basophils Absolute: 0.1 10*3/uL (ref 0.0–0.1)
Basophils Relative: 1 %
Eosinophils Absolute: 0.3 10*3/uL (ref 0.0–0.5)
Eosinophils Relative: 5 %
HCT: 40 % (ref 36.0–46.0)
Hemoglobin: 12.9 g/dL (ref 12.0–15.0)
Immature Granulocytes: 0 %
Lymphocytes Relative: 36 %
Lymphs Abs: 2.2 10*3/uL (ref 0.7–4.0)
MCH: 28.4 pg (ref 26.0–34.0)
MCHC: 32.3 g/dL (ref 30.0–36.0)
MCV: 87.9 fL (ref 80.0–100.0)
Monocytes Absolute: 0.8 10*3/uL (ref 0.1–1.0)
Monocytes Relative: 13 %
Neutro Abs: 2.8 10*3/uL (ref 1.7–7.7)
Neutrophils Relative %: 45 %
Platelets: 160 10*3/uL (ref 150–400)
RBC: 4.55 MIL/uL (ref 3.87–5.11)
RDW: 18.8 % — ABNORMAL HIGH (ref 11.5–15.5)
WBC: 6.1 10*3/uL (ref 4.0–10.5)
nRBC: 0 % (ref 0.0–0.2)

## 2022-05-01 LAB — RAPID URINE DRUG SCREEN, HOSP PERFORMED
Amphetamines: NOT DETECTED
Barbiturates: NOT DETECTED
Benzodiazepines: NOT DETECTED
Cocaine: NOT DETECTED
Opiates: NOT DETECTED
Tetrahydrocannabinol: NOT DETECTED

## 2022-05-01 LAB — BASIC METABOLIC PANEL
Anion gap: 10 (ref 5–15)
BUN: 17 mg/dL (ref 8–23)
CO2: 30 mmol/L (ref 22–32)
Calcium: 8.3 mg/dL — ABNORMAL LOW (ref 8.9–10.3)
Chloride: 97 mmol/L — ABNORMAL LOW (ref 98–111)
Creatinine, Ser: 0.8 mg/dL (ref 0.44–1.00)
GFR, Estimated: >60 mL/min (ref >60)
Glucose, Bld: 123 mg/dL — ABNORMAL HIGH (ref 70–99)
Potassium: 3.3 mmol/L — ABNORMAL LOW (ref 3.5–5.1)
Sodium: 137 mmol/L (ref 135–145)

## 2022-05-01 LAB — PHOSPHORUS: Phosphorus: 3.2 mg/dL (ref 2.5–4.6)

## 2022-05-01 LAB — MAGNESIUM: Magnesium: 2.2 mg/dL (ref 1.7–2.4)

## 2022-05-01 MED ORDER — GABAPENTIN 100 MG PO CAPS
100.0000 mg | ORAL_CAPSULE | Freq: Two times a day (BID) | ORAL | Status: DC
  Administered 2022-05-01 – 2022-05-02 (×3): 100 mg via ORAL
  Filled 2022-05-01 (×3): qty 1

## 2022-05-01 MED ORDER — POTASSIUM CHLORIDE CRYS ER 20 MEQ PO TBCR
40.0000 meq | EXTENDED_RELEASE_TABLET | Freq: Once | ORAL | Status: AC
  Administered 2022-05-01: 40 meq via ORAL
  Filled 2022-05-01: qty 2

## 2022-05-01 MED ORDER — POTASSIUM CHLORIDE CRYS ER 20 MEQ PO TBCR
20.0000 meq | EXTENDED_RELEASE_TABLET | Freq: Once | ORAL | Status: AC
  Administered 2022-05-01: 20 meq via ORAL
  Filled 2022-05-01: qty 1

## 2022-05-01 MED ORDER — CYCLOBENZAPRINE HCL 5 MG PO TABS
5.0000 mg | ORAL_TABLET | Freq: Three times a day (TID) | ORAL | Status: DC | PRN
  Administered 2022-05-01 – 2022-05-02 (×3): 5 mg via ORAL
  Filled 2022-05-01 (×3): qty 1

## 2022-05-01 NOTE — Progress Notes (Addendum)
PROGRESS NOTE    Denise Kelly  [id number] DOB: 06/28/1940 DOA: 04/28/2022 PCP: [name], MD     Brief Narrative:   82 y.o. female with medical history significant for diastolic congestive heart failure (echo 12/2017 EF 60-65% with G1DD),  lymphedema, chronic pain syndrome, restless leg syndrome, GERD, hypertension, history of pulmonary embolism and DVT on Eliquis, who presented to [facility] ED from home via EMS after being found lying in her own urine, complaining of severe generalized weakness and bilateral lower extremity pain.   Of note, patient was recently admitted to [facility] from 2/4 until 2/7 for cellulitis of the bilateral lower extremities.  Patient was also found to be markedly weak during that hospitalization.  Patient was felt to benefit from skilled physical therapy services in a skilled nursing facility but declined and instead was discharged home on 2/7.  Upon evaluation in the emergency department on this presentation, due to patient's profound weakness the hospitalist group was then called to assess the patient for admission to the hospital, she now is agreeing to snf placement   Subjective:  Was watching cell phone when I entered her room, once I entered her room she reports right leg and right foot pain, she denies back pain  bilateral lower extremity chronic venous statsis changes, less erythematous, less edema  She reports has some memory issues  , she declined CT head  Assessment & Plan:  Principal Problem:   Generalized weakness Active Problems:   Right hip pain   Hereditary lymphedema of legs   Chronic diastolic CHF (congestive heart failure) ([place])   Essential hypertension   Personal history of thromboembolic disease   GERD without esophagitis   Hypokalemia   LAFB (left anterior fascicular block)   FTT (failure to thrive) in adult    Assessment and Plan:    Generalized weakness Severe generalized weakness leading to patient being  found lying in her own urine at her place of residence brought in by EMS Patient was recommended to go to skilled nursing facility during recent hospitalization earlier in the month but declined and decided to go home Patient is now agreeable Weakness felt to be secondary to progressive deconditioning Ck, TSH, folic acid unremarkable ,COVID-19 PCR testing negative -vitamin B12 230 , start B12 supplement   Right hip pain Patient complaining of severe bilateral lower extremity pain, seemingly worst in the right thigh and hip Exam however does not reveal substantial worsening of the pain with manipulation of the right hip joint or knee Patient was found down by EMS.   x-rays of the hips and right knee no acute findings Topical lidocaine patch, start low dose neurontin   Hallucination? No leukocytosis, no fever, ua no bacteremia, cxr on acute finding on 2/13 She refused CT head, on high dose thiamine due to h/o alcohol use Unsure when was the last drink, family states patient has not had ways to get alcohol since September, patient reports last drink was many years ago She is currently aaox3, does has some long term memory issues    Chronic  lymphedema of legs Patient possesses chronic bilateral lower extremity swelling due to lymphedema Chronic hyperpigmentation of the skin with multiple areas of skin breakdown Clinically does not appear infected Initiate Unna boots  Chronic diastolic CHF (congestive heart failure) (HCC) No clinical evidence of cardiogenic volume overload Continue home regimen of daily Lasix therapy Strict input and output monitoring Daily weights Low-sodium diet   Personal history of thromboembolic disease History  of DVT and PE in 2019 Continue home regimen of apixaban  GERD without esophagitis Continuing home regimen of daily PPI therapy.   Hypokalemia Remain low, continue to replace, mag/phos ok     I have Reviewed nursing notes, Vitals, pain scores,  I/o's, Lab results and  imaging results since pt's last encounter, details please see discussion above  I ordered the following labs:  Unresulted Labs (From admission, onward)     Start     Ordered   05/01/22 0952  Rapid urine drug screen (hospital performed)  ONCE - STAT,   STAT        05/01/22 0951             DVT prophylaxis:  apixaban (ELIQUIS) tablet 5 mg   Code Status:   Code Status: Full Code  Family Communication: None at bedside Disposition:   Status is: Observation   Dispo: The patient is from: Home, lives alone, her  daughter committed suicide a few years ago, her only family member is a nephew in [place] per patient              Anticipated d/c is to: SNF              Anticipated d/c date is: snf on 2/16  Antimicrobials:    Anti-infectives (From admission, onward)    Start     Dose/Rate Route Frequency Ordered Stop   04/28/22 2330  cephALEXin (KEFLEX) capsule 500 mg        500 mg Oral  Once 04/28/22 2322 04/28/22 2351          Objective: Vitals:   04/30/22 0558 04/30/22 1442 04/30/22 1942 05/01/22 0424  BP: 122/67 109/62 115/65 127/73  Pulse: 89 83 94 92  Resp: 16 18 17 17  $ Temp: 98.6 F (37 C) 97.7 F (36.5 C) 97.7 F (36.5 C) 97.7 F (36.5 C)  TempSrc:  Oral Oral   SpO2: 94% 99% 94% 97%  Weight:    87.9 kg  Height:        Intake/Output Summary (Last 24 hours) at 05/01/2022 1053 Last data filed at 05/01/2022 [id number] Gross per 24 hour  Intake 1760 ml  Output 3300 ml  Net -1540 ml   Filed Weights   04/28/22 1448 04/29/22 0054 05/01/22 0424  Weight: 86.6 kg 89 kg 87.9 kg    Examination:  General exam: frail, chronically ill , aaox3 Respiratory system: Clear to auscultation. Respiratory effort normal. Cardiovascular system:  RRR.  Gastrointestinal system: Abdomen is nondistended, soft and nontender.  Normal bowel sounds heard. Central nervous system: Alert and oriented. No focal neurological deficits. Extremities:  chronic venous  stasis changes bilateral lower extremity edema with mild erythema has improved  Skin: as above Psychiatry: Judgement and insight appear normal. Mood & affect appropriate. Intermittent hallucination?    Data Reviewed: I have personally reviewed  labs and visualized  imaging studies since the last encounter and formulate the plan        Scheduled Meds:  apixaban  5 mg Oral BID   vitamin B-12  1,000 mcg Oral Daily   folic acid  1 mg Oral Daily   furosemide  40 mg Oral Daily   gabapentin  100 mg Oral BID   lidocaine  1 patch Transdermal Q24H   multivitamin with minerals  1 tablet Oral Daily   pantoprazole  40 mg Oral Daily   potassium chloride  40 mEq Oral Once   pramipexole  0.125 mg Oral Daily  Continuous Infusions:  thiamine (VITAMIN B1) injection 500 mg (04/30/22 2249)     LOS: 0 days   Time spent: 38mns  FFlorencia Reasons MD PhD FACP [facility]  Available via Epic secure chat 7am-7pm for nonurgent issues Please page for urgent issues To page the attending provider between 7A-7P or the covering provider during after hours 7P-7A, please log into the web site [email or web address] and access using universal [place] password for that web site. If you do not have the password, please call the hospital operator.    05/01/2022, 10:53 AM

## 2022-05-01 NOTE — TOC Progression Note (Signed)
Transition of Care [facility]) - Progression Note    Patient Details  Name: Denise Kelly MRN: [id number] Date of Birth: 13-Mar-1941  Transition of Care [facility]) CM/SW [place], [place] Phone Number: 05/01/2022, 10:15 AM  Clinical Narrative:    [facility] unable to accept pt to their facility until 2/16 due to bed availability.     Expected Discharge Plan: Skilled Nursing Facility Barriers to Discharge: Barriers Resolved  Expected Discharge Plan and Services In-house Referral: Clinical Social Work Discharge Planning Services: NA Post Acute Care Choice: [place] Living arrangements for the past 2 months: Apartment                 DME Arranged: N/A DME Agency: NA                   Social Determinants of Health (SDOH) Interventions SDOH Screenings   Food Insecurity: Food Insecurity Present (04/29/2022)  Housing: Low Risk  (04/29/2022)  Transportation Needs: Unmet Transportation Needs (04/29/2022)  Utilities: Not At Risk (04/29/2022)  Depression (PHQ2-9): Low Risk  (01/09/2019)  Tobacco Use: Medium Risk (04/29/2022)    Readmission Risk Interventions     No data to display

## 2022-05-02 DIAGNOSIS — I5032 Chronic diastolic (congestive) heart failure: Secondary | ICD-10-CM | POA: Diagnosis not present

## 2022-05-02 DIAGNOSIS — R600 Localized edema: Secondary | ICD-10-CM | POA: Diagnosis not present

## 2022-05-02 DIAGNOSIS — K219 Gastro-esophageal reflux disease without esophagitis: Secondary | ICD-10-CM | POA: Diagnosis not present

## 2022-05-02 DIAGNOSIS — I480 Paroxysmal atrial fibrillation: Secondary | ICD-10-CM | POA: Diagnosis not present

## 2022-05-02 DIAGNOSIS — R2681 Unsteadiness on feet: Secondary | ICD-10-CM | POA: Diagnosis not present

## 2022-05-02 DIAGNOSIS — L0390 Cellulitis, unspecified: Secondary | ICD-10-CM | POA: Diagnosis not present

## 2022-05-02 DIAGNOSIS — R2689 Other abnormalities of gait and mobility: Secondary | ICD-10-CM | POA: Diagnosis not present

## 2022-05-02 DIAGNOSIS — M6281 Muscle weakness (generalized): Secondary | ICD-10-CM | POA: Diagnosis not present

## 2022-05-02 DIAGNOSIS — R1312 Dysphagia, oropharyngeal phase: Secondary | ICD-10-CM | POA: Diagnosis not present

## 2022-05-02 DIAGNOSIS — M79606 Pain in leg, unspecified: Secondary | ICD-10-CM | POA: Diagnosis not present

## 2022-05-02 DIAGNOSIS — I890 Lymphedema, not elsewhere classified: Secondary | ICD-10-CM | POA: Diagnosis not present

## 2022-05-02 DIAGNOSIS — B3731 Acute candidiasis of vulva and vagina: Secondary | ICD-10-CM | POA: Diagnosis not present

## 2022-05-02 DIAGNOSIS — R41841 Cognitive communication deficit: Secondary | ICD-10-CM | POA: Diagnosis not present

## 2022-05-02 DIAGNOSIS — R601 Generalized edema: Secondary | ICD-10-CM | POA: Diagnosis not present

## 2022-05-02 DIAGNOSIS — M6289 Other specified disorders of muscle: Secondary | ICD-10-CM | POA: Diagnosis not present

## 2022-05-02 DIAGNOSIS — M25551 Pain in right hip: Secondary | ICD-10-CM | POA: Diagnosis not present

## 2022-05-02 DIAGNOSIS — R059 Cough, unspecified: Secondary | ICD-10-CM | POA: Diagnosis not present

## 2022-05-02 DIAGNOSIS — R531 Weakness: Secondary | ICD-10-CM | POA: Diagnosis not present

## 2022-05-02 DIAGNOSIS — I10 Essential (primary) hypertension: Secondary | ICD-10-CM | POA: Diagnosis not present

## 2022-05-02 LAB — BASIC METABOLIC PANEL
Anion gap: 9 (ref 5–15)
BUN: 17 mg/dL (ref 8–23)
CO2: 31 mmol/L (ref 22–32)
Calcium: 8.5 mg/dL — ABNORMAL LOW (ref 8.9–10.3)
Chloride: 100 mmol/L (ref 98–111)
Creatinine, Ser: 0.87 mg/dL (ref 0.44–1.00)
GFR, Estimated: >60 mL/min (ref >60)
Glucose, Bld: 112 mg/dL — ABNORMAL HIGH (ref 70–99)
Potassium: 3.5 mmol/L (ref 3.5–5.1)
Sodium: 140 mmol/L (ref 135–145)

## 2022-05-02 MED ORDER — GABAPENTIN 100 MG PO CAPS: 100.0000 mg | ORAL_CAPSULE | Freq: Two times a day (BID) | ORAL | Status: DC

## 2022-05-02 MED ORDER — POTASSIUM CHLORIDE CRYS ER 20 MEQ PO TBCR
40.0000 meq | EXTENDED_RELEASE_TABLET | Freq: Once | ORAL | Status: AC
  Administered 2022-05-02: 40 meq via ORAL
  Filled 2022-05-02: qty 2

## 2022-05-02 MED ORDER — CYANOCOBALAMIN 1000 MCG PO TABS: 1000.0000 ug | ORAL_TABLET | Freq: Every day | ORAL | Status: DC

## 2022-05-02 MED ORDER — LIDOCAINE 5 % EX PTCH: 1.0000 | MEDICATED_PATCH | CUTANEOUS | 0 refills | Status: DC

## 2022-05-02 MED ORDER — POTASSIUM CHLORIDE CRYS ER 20 MEQ PO TBCR: 20.0000 meq | EXTENDED_RELEASE_TABLET | Freq: Every day | ORAL | Status: DC

## 2022-05-02 MED ORDER — GABAPENTIN 100 MG PO CAPS: 100.0000 mg | ORAL_CAPSULE | Freq: Three times a day (TID) | ORAL | Status: DC

## 2022-05-02 MED ORDER — SENNOSIDES-DOCUSATE SODIUM 8.6-50 MG PO TABS
1.0000 | ORAL_TABLET | Freq: Two times a day (BID) | ORAL | Status: DC
  Administered 2022-05-02: 1 via ORAL
  Filled 2022-05-02: qty 1

## 2022-05-02 MED ORDER — MELATONIN 3 MG PO TABS: 3.0000 mg | ORAL_TABLET | Freq: Every day | ORAL | 0 refills | Status: DC

## 2022-05-02 MED ORDER — GUAIFENESIN ER 600 MG PO TB12: 600.0000 mg | ORAL_TABLET | Freq: Two times a day (BID) | ORAL | 2 refills | Status: DC

## 2022-05-02 MED ORDER — PANTOPRAZOLE SODIUM 40 MG PO TBEC: 40.0000 mg | DELAYED_RELEASE_TABLET | Freq: Every day | ORAL | Status: DC

## 2022-05-02 MED ORDER — SENNOSIDES-DOCUSATE SODIUM 8.6-50 MG PO TABS: 1.0000 | ORAL_TABLET | Freq: Every day | ORAL | Status: DC

## 2022-05-02 MED ORDER — TRAZODONE HCL 100 MG PO TABS: 100.0000 mg | ORAL_TABLET | Freq: Every evening | ORAL | Status: DC | PRN

## 2022-05-02 MED ORDER — MAGNESIUM OXIDE 400 MG PO CAPS: 400.0000 mg | ORAL_CAPSULE | ORAL | 0 refills | Status: DC

## 2022-05-02 MED ORDER — POLYETHYLENE GLYCOL 3350 17 G PO PACK: 17.0000 g | PACK | Freq: Every day | ORAL | 0 refills | Status: DC

## 2022-05-02 MED ORDER — POLYETHYLENE GLYCOL 3350 17 G PO PACK
17.0000 g | PACK | Freq: Every day | ORAL | Status: DC
  Administered 2022-05-02: 17 g via ORAL
  Filled 2022-05-02: qty 1

## 2022-05-02 NOTE — Plan of Care (Signed)

## 2022-05-02 NOTE — TOC Transition Note (Signed)
Transition of Care [facility]) - CM/SW Discharge Note   Patient Details  Name: Denise Kelly MRN: [id number] Date of Birth: 1940-07-11  Transition of Care [facility]) CM/SW Contact:  Vassie Moselle, [place] Phone Number: 05/02/2022, 11:22 AM   Clinical Narrative:    Pt is to transfer to [facility] for SNF placement. Pt will be going to room 100. RN to call report to [phone]. Freda Munro at [employer] is to come to hospital to complete paperwork with pt prior to transfer. Once paperwork completed [facility] will be called for transportation.    Final next level of care: Skilled Nursing Facility Barriers to Discharge: Barriers Resolved   Patient Goals and CMS Choice CMS Medicare.gov Compare Post Acute Care list provided to:: Patient Choice offered to / list presented to : Patient  Discharge Placement                Patient chooses bed at: [facility] Patient to be transferred to facility by: [place] Name of family member notified: Pt Patient and family notified of of transfer: 05/02/22  Discharge Plan and Services Additional resources added to the After Visit Summary for   In-house Referral: Clinical Social Work Discharge Planning Services: NA Post Acute Care Choice: [place]          DME Arranged: N/A DME Agency: NA                  Social Determinants of Health (SDOH) Interventions SDOH Screenings   Food Insecurity: Food Insecurity Present (04/29/2022)  Housing: Low Risk  (04/29/2022)  Transportation Needs: Unmet Transportation Needs (04/29/2022)  Utilities: Not At Risk (04/29/2022)  Depression (PHQ2-9): Low Risk  (01/09/2019)  Tobacco Use: Medium Risk (04/29/2022)     Readmission Risk Interventions    05/02/2022   11:19 AM  Readmission Risk Prevention Plan  Transportation Screening Complete  Medication Review (RN Care Manager) Complete  PCP or Specialist appointment within 3-5 days of discharge Complete  HRI or Home Care Consult Complete   SW Recovery Care/Counseling Consult Complete  Palliative Care Screening Not Applicable  Skilled Nursing Facility Complete

## 2022-05-02 NOTE — Discharge Summary (Signed)
Discharge Summary  Denise Kelly [id number] DOB: [birth year]  PCP: [name], MD  Admit date: 04/28/2022 Discharge date: 05/02/2022  30 Day Unplanned Readmission Risk Score    Flowsheet Row ED to Hosp-Admission (Current) from 04/28/2022 in [place] [facility]  30 Day Unplanned Readmission Risk Score (%) 30.39 Filed at 05/02/2022 0801       This score is the patient's risk of an unplanned readmission within 30 days of being discharged (0 -100%). The score is based on dignosis, age, lab data, medications, orders, and past utilization.   Low:  0-14.9   Medium: 15-21.9   High: 22-29.9   Extreme: 30 and above         Time spent: 37mns, more than 50% time spent on coordination of care.   Recommendations for Outpatient Follow-up:  F/u with SNF MD for hospital discharge follow up, repeat cbc/bmp at follow up Recommend palliative care to follow patient at snf and moving forward     Discharge Diagnoses:  Active Hospital Problems   Diagnosis Date Noted   Generalized weakness 04/28/2022    Priority: 1.   Right hip pain 04/30/2022    Priority: 2.   Hereditary lymphedema of legs 04/30/2022    Priority: 3.   Chronic diastolic CHF (congestive heart failure) ([place] 04/30/2022    Priority: 4.   Essential hypertension 11/10/2013    Priority: 5.   Personal history of thromboembolic disease [id number]   Priority: 6.   GERD without esophagitis 04/30/2022    Priority: 7.   Hypokalemia 03/14/2014    Priority: 8.   FTT (failure to thrive) in adult 05/01/2022   LAFB (left anterior fascicular block) 11/10/2013    Resolved Hospital Problems  No resolved problems to display.    Discharge Condition: stable  Diet recommendation: heart healthy  Filed Weights   04/28/22 1448 04/29/22 0054 05/01/22 0424  Weight: 86.6 kg 89 kg 87.9 kg    History of present illness: (Per admitting provider Dr. HNevada Crane Chief Complaint: Generalized weakness, pain in  legs.   HPI: Denise HASPERis a 82y.o. female with medical history significant for lymphedema, chronic pain syndrome, restless leg syndrome, GERD, hypertension, history of pulmonary embolism and DVT on Eliquis, [facility], due to generalized weakness and bilateral lower extremity pain.  Discharged from the hospital just 5 days ago after being admitted and treated for mild bilateral lower extremity cellulitis.  Plan initially was to discharge to SNF however the patient declined SNF and was discharged to her home with home health services.  She was prescribed 5 days of Keflex, to complete the course of her antibiotics on 04/28/2022.  States she has been compliant with her home medications.     Brought into the ED via EMS.  Per EMS, patient was laying in her bed full of urine.  The patient has had some difficulty taking care of herself.  States she is the only living member of her family.  She lost her daughter, her only child, to suicide last year.  Her daughter worked as an EPublic relations account executivefor [employer]     The patient states she would be willing to go to SNF this time, at discharge, and prefers [facility].   In the ED, vital signs are stable.  Afebrile with no leukocytosis.  [facility]   ED Course: Tmax 98.3.  BP 04/09/1973, pulse 94, respiratory  18, O2 saturation 94% on room air.  Lab studies notable for serum glucose 121, albumin 3.4.  CBC essentially unremarkable.    Hospital Course:  Principal Problem:   Generalized weakness Active Problems:   Right hip pain   Hereditary lymphedema of legs   Chronic diastolic CHF (congestive heart failure) (HCC)   Essential hypertension   Personal history of thromboembolic disease   GERD without esophagitis   Hypokalemia   LAFB (left anterior fascicular block)   FTT (failure to thrive) in adult   Assessment and Plan:  Generalized weakness Severe generalized weakness leading to patient  being found lying in her own urine at her place of residence brought in by EMS Patient was recommended to go to skilled nursing facility during recent hospitalization earlier in the month but declined and decided to go home Patient is now agreeable Weakness felt to be secondary to progressive deconditioning Ck, TSH, folic acid unremarkable ,COVID-19 PCR testing negative -vitamin B12 230 , start B12 supplement    Right hip pain Patient complaining of severe bilateral lower extremity pain, seemingly worst in the right thigh and hip Exam however does not reveal substantial worsening of the pain with manipulation of the right hip joint or knee Patient was found down by EMS.   x-rays of the hips and right knee no acute findings Topical lidocaine patch, start low dose neurontin and titrate up as able, avoid narcotics     Hallucination? -On 2/14, per OT , patient stateed her boyfriend named "Lindwood Qua the tennis player" who communicates with her on her phone.  -No leukocytosis, no fever, ua no bacteremia, cxr on acute finding on 2/13 -She refused CT head, she is treated with  high dose  iv thiamine 565m tid  due to h/o alcohol use -She is currently aaox3, calm and cooperative , she does has some long term memory issues      Chronic  lymphedema of legs Patient possesses chronic bilateral lower extremity swelling due to lymphedema Chronic hyperpigmentation of the skin with multiple areas of skin breakdown Clinically does not appear infected Improving    Chronic diastolic CHF (congestive heart failure) (HCC) No clinical evidence of cardiogenic volume overload Continue home regimen of daily Lasix therapy Strict input and output monitoring Daily weights Low-sodium diet     Personal history of thromboembolic disease History of DVT and PE in 2019 Continue home regimen of apixaban   GERD without esophagitis Continuing home regimen of daily PPI therapy.      Hypokalemia replacd    Discharge Exam: BP 111/60 (BP Location: Right Arm)   Pulse 87   Temp 98.3 F (36.8 C)   Resp 18   Ht 5' 7"$  (1.702 m)   Wt 87.9 kg   SpO2 96%   BMI 30.35 kg/m   General: NAD, AAOx3 Cardiovascular: rrr, bilateral lower extremity edema has improved, erythema has improved  Respiratory: normal respiratory effort     Discharge Instructions     Diet - low sodium heart healthy   Complete by: As directed    Increase activity slowly   Complete by: As directed       Allergies as of 05/02/2022       Reactions   Sulfa Antibiotics Hives, Swelling, Other (See Comments)   Facial/eye swelling    Coreg [carvedilol] Other (See Comments)   Memory loss   Motrin [ibuprofen] Palpitations   Toprol Xl [metoprolol Tartrate] Cough   Doxycycline Hyclate Other (See Comments)   HEARTBURN  Flovent Hfa [fluticasone] Itching   Statins Other (See Comments)   MENTAL STATUS CHANGE        Medication List     STOP taking these medications    cephALEXin 500 MG capsule Commonly known as: KEFLEX   hydrOXYzine 25 MG tablet Commonly known as: ATARAX       TAKE these medications    acetaminophen 500 MG tablet Commonly known as: TYLENOL Take 500 mg by mouth every 6 (six) hours as needed for moderate pain.   cyanocobalamin 1000 MCG tablet Take 1 tablet (1,000 mcg total) by mouth daily. Start taking on: May 03, 2022   Eliquis 5 MG Tabs tablet Generic drug: apixaban Take 5 mg by mouth 2 (two) times daily.   folic acid 1 MG tablet Commonly known as: FOLVITE Take 1 tablet (1 mg total) by mouth daily.   furosemide 40 MG tablet Commonly known as: LASIX Take 1 tablet (40 mg total) by mouth daily.   gabapentin 100 MG capsule Commonly known as: NEURONTIN Take 1 capsule (100 mg total) by mouth 3 (three) times daily.   guaiFENesin 600 MG 12 hr tablet Commonly known as: Mucinex Take 1 tablet (600 mg total) by mouth 2 (two) times daily.   lidocaine 5  % Commonly known as: LIDODERM Place 1 patch onto the skin daily. Remove & Discard patch within 12 hours or as directed by MD   Magnesium Oxide 400 MG Caps Take 1 capsule (400 mg total) by mouth every Monday, Wednesday, and Friday.   melatonin 3 MG Tabs tablet Take 1 tablet (3 mg total) by mouth at bedtime.   multivitamin with minerals Tabs tablet Take 1 tablet by mouth daily.   ondansetron 4 MG tablet Commonly known as: ZOFRAN Take 4 mg by mouth every 8 (eight) hours as needed for nausea or vomiting.   pantoprazole 40 MG tablet Commonly known as: PROTONIX Take 1 tablet (40 mg total) by mouth daily. Start taking on: May 03, 2022   polyethylene glycol 17 g packet Commonly known as: MIRALAX / GLYCOLAX Take 17 g by mouth daily. Hold if diarrhea Start taking on: May 03, 2022   potassium chloride SA 20 MEQ tablet Commonly known as: KLOR-CON M Take 1 tablet (20 mEq total) by mouth daily.   pramipexole 0.125 MG tablet Commonly known as: MIRAPEX Take 0.125 mg by mouth daily.   senna-docusate 8.6-50 MG tablet Commonly known as: Senokot-S Take 1 tablet by mouth at bedtime. Hold if diarrhea   thiamine 100 MG tablet Commonly known as: VITAMIN B1 Take 1 tablet (100 mg total) by mouth daily.   traZODone 100 MG tablet Commonly known as: DESYREL Take 1 tablet (100 mg total) by mouth at bedtime as needed for sleep. What changed: how much to take       Allergies  Allergen Reactions   Sulfa Antibiotics Hives, Swelling and Other (See Comments)    Facial/eye swelling    Coreg [Carvedilol] Other (See Comments)    Memory loss   Motrin [Ibuprofen] Palpitations   Toprol Xl [Metoprolol Tartrate] Cough   Doxycycline Hyclate Other (See Comments)    HEARTBURN   Flovent Hfa [Fluticasone] Itching   Statins Other (See Comments)    MENTAL STATUS CHANGE    Contact information for after-discharge care     Destination     [facility] Preferred SNF .   Service:  Skilled Nursing Contact information: [address] [place] [phone]  The results of significant diagnostics from this hospitalization (including imaging, microbiology, ancillary and laboratory) are listed below for reference.    Significant Diagnostic Studies: DG KNEE 3 VIEW RIGHT  Result Date: 04/30/2022 CLINICAL DATA:  Hip fracture and knee injury. EXAM: RIGHT KNEE - 3 VIEW COMPARISON:  None Available. FINDINGS: Right total knee arthroplasty with patellar femoral component. Components appear well seated. No evidence of acute fracture or dislocation. No focal bone lesion or bone destruction. Bone cortex appears intact. No significant effusions. IMPRESSION: Right total knee arthroplasty. Components appear well seated. No acute displaced fractures identified. Electronically Signed   By: Lucienne Capers M.D.   On: 04/30/2022 00:27   DG Pelvis 1-2 Views  Result Date: 04/30/2022 CLINICAL DATA:  Hip fracture and knee injury. EXAM: PELVIS - 1-2 VIEW COMPARISON:  CT 09/30/2019 FINDINGS: Postoperative changes with posterior fixation of the lower lumbar spine and internal fixation of an old fracture of the left proximal femur. Degenerative changes in both hips. No acute fracture or dislocation is identified. No focal bone lesions. SI joints and symphysis pubis are not displaced. IMPRESSION: Degenerative changes in the hips. No acute displaced fractures are identified. Old postoperative changes as above. Electronically Signed   By: Lucienne Capers M.D.   On: 04/30/2022 00:26   DG Chest 1 View  Result Date: 04/29/2022 CLINICAL DATA:  Pneumonia EXAM: CHEST  1 VIEW COMPARISON:  Radiograph 01/13/2021 FINDINGS: Unchanged cardiomediastinal silhouette. There is no focal airspace consolidation. There is no pleural effusion or evidence of pneumothorax. No acute osseous abnormality. Thoracic spondylosis. Bilateral shoulder degenerative changes.  IMPRESSION: No evidence of acute cardiopulmonary disease. Electronically Signed   By: Maurine Simmering M.D.   On: 04/29/2022 09:06   VAS [place] LOWER EXTREMITY VENOUS (DVT) (ONLY [facility] & [facility])  Result Date: 04/21/2022  Lower Venous DVT Study Patient Name:  MAYCEE MACGREGOR  Date of Exam:   04/20/2022 Medical Rec #: [id number]        Accession #:    [id number] Date of Birth: 1940/05/19        Patient Gender: F Patient Age:   82 years Exam Location:  [facility] Procedure:      VAS [place] LOWER EXTREMITY VENOUS (DVT) Referring Phys: JULIE HAVILAND --------------------------------------------------------------------------------  Indications: Pain, swelling, redness in the setting of cellulitis and lymphedema. Other Indications: History of DVT/PE. Patient previously on Eliquis. Limitations: Patient intolerant to moderate probe pressure, highly intolerant to compressions, constant patient movement, tissue properties. Comparison Study: 01-14-2021 Prior bilateral lower extremity venous study was                   limited due to patient intolerance to compression, but                   negative for DVT. Performing Technologist: Darlin Coco RDMS, RVT  Examination Guidelines: A complete evaluation includes B-mode imaging, spectral Doppler, color Doppler, and power Doppler as needed of all accessible portions of each vessel. Bilateral testing is considered an integral part of a complete examination. Limited examinations for reoccurring indications may be performed as noted. The reflux portion of the exam is performed with the patient in reverse Trendelenburg.  +---------+---------------+---------+-----------+----------+-------------------+ RIGHT    CompressibilityPhasicitySpontaneityPropertiesThrombus Aging      +---------+---------------+---------+-----------+----------+-------------------+ CFV      Full           Yes      Yes                                       +---------+---------------+---------+-----------+----------+-------------------+  SFJ      Full                                                             +---------+---------------+---------+-----------+----------+-------------------+ FV Prox  Full                                                             +---------+---------------+---------+-----------+----------+-------------------+ FV Mid   Full                                                             +---------+---------------+---------+-----------+----------+-------------------+ FV DistalFull                                                             +---------+---------------+---------+-----------+----------+-------------------+ PFV      Full                                                             +---------+---------------+---------+-----------+----------+-------------------+ POP                     Yes      Yes                  Unable to tolerate                                                        compression         +---------+---------------+---------+-----------+----------+-------------------+ PTV                     Yes      Yes                  Patent by color-                                                          unable to tolerate  compression         +---------+---------------+---------+-----------+----------+-------------------+ PERO                    Yes      Yes                  Patent by color-                                                          unable to tolerate                                                        compression         +---------+---------------+---------+-----------+----------+-------------------+   +---------+---------------+---------+-----------+----------+-------------------+ LEFT     CompressibilityPhasicitySpontaneityPropertiesThrombus Aging       +---------+---------------+---------+-----------+----------+-------------------+ CFV      Full           Yes      Yes                                      +---------+---------------+---------+-----------+----------+-------------------+ SFJ      Full                                                             +---------+---------------+---------+-----------+----------+-------------------+ FV Prox  Full                                                             +---------+---------------+---------+-----------+----------+-------------------+ FV Mid   Full                                                             +---------+---------------+---------+-----------+----------+-------------------+ FV Distal               Yes      Yes                  Patent by color-                                                          unable to tolerate  compression         +---------+---------------+---------+-----------+----------+-------------------+ PFV      Full                                                             +---------+---------------+---------+-----------+----------+-------------------+ POP                     Yes      Yes                                      +---------+---------------+---------+-----------+----------+-------------------+ PTV                                                   Patent by color-                                                          unable to tolerate                                                        compression         +---------+---------------+---------+-----------+----------+-------------------+ PERO                                                  Not visualized-                                                           unable to contact                                                         skin with probe                                                            without patient  movement/pain       +---------+---------------+---------+-----------+----------+-------------------+     Summary: RIGHT: - There is no evidence of deep vein thrombosis in the lower extremity. However, portions of this examination were limited- see technologist comments above.  - No cystic structure found in the popliteal fossa.  LEFT: - There is no evidence of deep vein thrombosis in the lower extremity. However, portions of this examination were limited- see technologist comments above.  - No cystic structure found in the popliteal fossa.  *See table(s) above for measurements and observations. Electronically signed by Servando Snare MD on 04/21/2022 at 2:40:15 PM.    Final    DG Chest Port 1 View  Result Date: 04/06/2022 CLINICAL DATA:  Bilateral lower extremity pain and edema. EXAM: PORTABLE CHEST 1 VIEW COMPARISON:  01/13/2021. FINDINGS: The heart size and mediastinal contours are within normal limits. Minimal atelectasis is present at the left lung base. No effusion or pneumothorax. No acute osseous abnormality. IMPRESSION: Minimal atelectasis at the left lung base. Electronically Signed   By: Brett Fairy M.D.   On: 04/06/2022 00:28    Microbiology: Recent Results (from the past 240 hour(s))  Resp panel by RT-PCR (RSV, Flu A&B, Covid) Anterior Nasal Swab     Status: None   Collection Time: 04/29/22  8:26 AM   Specimen: Anterior Nasal Swab  Result Value Ref Range Status   SARS Coronavirus 2 by RT PCR NEGATIVE NEGATIVE Final    Comment: (NOTE) SARS-CoV-2 target nucleic acids are NOT DETECTED.  The SARS-CoV-2 RNA is generally detectable in upper respiratory specimens during the acute phase of infection. The lowest concentration of SARS-CoV-2 viral copies this assay can detect is 138 copies/mL. A negative result does not preclude SARS-Cov-2 infection and should not  be used as the sole basis for treatment or other patient management decisions. A negative result may occur with  improper specimen collection/handling, submission of specimen other than nasopharyngeal swab, presence of viral mutation(s) within the areas targeted by this assay, and inadequate number of viral copies(<138 copies/mL). A negative result must be combined with clinical observations, patient history, and epidemiological information. The expected result is Negative.  Fact Sheet for Patients:  EntrepreneurPulse.com.au  Fact Sheet for Healthcare Providers:  IncredibleEmployment.be  This test is no t yet approved or cleared by the [place] FDA and  has been authorized for detection and/or diagnosis of SARS-CoV-2 by FDA under an Emergency Use Authorization (EUA). This EUA will remain  in effect (meaning this test can be used) for the duration of the COVID-19 declaration under Section 564(b)(1) of the Act, 21 U.S.C.section 360bbb-3(b)(1), unless the authorization is terminated  or revoked sooner.       Influenza A by PCR NEGATIVE NEGATIVE Final   Influenza B by PCR NEGATIVE NEGATIVE Final    Comment: (NOTE) The Xpert Xpress SARS-CoV-2/FLU/RSV plus assay is intended as an aid in the diagnosis of influenza from Nasopharyngeal swab specimens and should not be used as a sole basis for treatment. Nasal washings and aspirates are unacceptable for Xpert Xpress SARS-CoV-2/FLU/RSV testing.  Fact Sheet for Patients: EntrepreneurPulse.com.au  Fact Sheet for Healthcare Providers: IncredibleEmployment.be  This test is not yet approved or cleared by the [place] FDA and has been authorized for detection and/or diagnosis of SARS-CoV-2 by FDA under an Emergency Use Authorization (EUA). This EUA will remain in effect (meaning this test can be used) for the duration of the COVID-19 declaration under Section  564(b)(1) of the Act, 21 U.S.C. section 360bbb-3(b)(1), unless  the authorization is terminated or revoked.     Resp Syncytial Virus by PCR NEGATIVE NEGATIVE Final    Comment: (NOTE) Fact Sheet for Patients: EntrepreneurPulse.com.au  Fact Sheet for Healthcare Providers: IncredibleEmployment.be  This test is not yet approved or cleared by the [place] FDA and has been authorized for detection and/or diagnosis of SARS-CoV-2 by FDA under an Emergency Use Authorization (EUA). This EUA will remain in effect (meaning this test can be used) for the duration of the COVID-19 declaration under Section 564(b)(1) of the Act, 21 U.S.C. section 360bbb-3(b)(1), unless the authorization is terminated or revoked.  Performed at [facility], [place] [address], [place], [place] [address]      Labs: Basic Metabolic Panel: Recent Labs  Lab 04/28/22 1549 04/29/22 0530 04/30/22 0559 05/01/22 0550 05/02/22 0614  NA 138 139 139 137 140  K 3.7 3.4* 3.7 3.3* 3.5  CL 100 100 100 97* 100  CO2 30 29 29 30 31  $ GLUCOSE 121* 113* 114* 123* 112*  BUN 12 17 16 17 17  $ CREATININE 0.63 0.75 0.78 0.80 0.87  CALCIUM 8.5* 8.3* 8.1* 8.3* 8.5*  MG  --  2.1 1.9 2.2  --   PHOS  --  4.6  --  3.2  --    Liver Function Tests: Recent Labs  Lab 04/28/22 1549 04/30/22 0559  AST 28 23  ALT 19 16  ALKPHOS 91 64  BILITOT 0.7 0.5  PROT 6.7 5.6*  ALBUMIN 3.4* 2.7*   No results for input(s): "LIPASE", "AMYLASE" in the last 168 hours. No results for input(s): "AMMONIA" in the last 168 hours. CBC: Recent Labs  Lab 04/28/22 1549 04/29/22 0530 04/30/22 0559 05/01/22 0550  WBC 7.7 6.2 5.5 6.1  NEUTROABS 5.3  --  2.0 2.8  HGB 14.3 11.8* 12.7 12.9  HCT 44.4 36.0 39.7 40.0  MCV 87.9 88.5 88.6 87.9  PLT 188 159 158 160   Cardiac Enzymes: Recent Labs  Lab 04/30/22 0559  CKTOTAL 61   BNP: BNP (last 3 results) Recent Labs    04/05/22 2359  04/28/22 1549  BNP 29.8 32.5    ProBNP (last 3 results) No results for input(s): "PROBNP" in the last 8760 hours.  CBG: No results for input(s): "GLUCAP" in the last 168 hours.  FURTHER DISCHARGE INSTRUCTIONS:   Get Medicines reviewed and adjusted: Please take all your medications with you for your next visit with your Primary MD   Laboratory/radiological data: Please request your Primary MD to go over all hospital tests and procedure/radiological results at the follow up, please ask your Primary MD to get all Hospital records sent to his/her office.   In some cases, they will be blood work, cultures and biopsy results pending at the time of your discharge. Please request that your primary care M.D. goes through all the records of your hospital data and follows up on these results.   Also Note the following: If you experience worsening of your admission symptoms, develop shortness of breath, life threatening emergency, suicidal or homicidal thoughts you must seek medical attention immediately by calling 911 or calling your MD immediately  if symptoms less severe.   You must read complete instructions/literature along with all the possible adverse reactions/side effects for all the Medicines you take and that have been prescribed to you. Take any new Medicines after you have completely understood and accpet all the possible adverse reactions/side effects.    Do not drive when taking Pain medications or sleeping  medications (Benzodaizepines)   Do not take more than prescribed Pain, Sleep and Anxiety Medications. It is not advisable to combine anxiety,sleep and pain medications without talking with your primary care practitioner   Special Instructions: If you have smoked or chewed Tobacco  in the last 2 yrs please stop smoking, stop any regular Alcohol  and or any Recreational drug use.   Wear Seat belts while driving.   Please note: You were cared for by a hospitalist during your  hospital stay. Once you are discharged, your primary care physician will handle any further medical issues. Please note that NO REFILLS for any discharge medications will be authorized once you are discharged, as it is imperative that you return to your primary care physician (or establish a relationship with a primary care physician if you do not have one) for your post hospital discharge needs so that they can reassess your need for medications and monitor your lab values.     Signed:  Florencia Reasons MD, PhD, FACP  [facility] 05/02/2022, 10:04 AM

## 2022-05-02 NOTE — Progress Notes (Signed)
Report called to [employer], RN received the report. Pt. Is aware of the discharge, will continue to provide care while waiting for transport.

## 2022-05-02 NOTE — Progress Notes (Signed)
PHYSICAL THERAPY  Pt declined 2 attempts for any OOB activity "I am NOT getting up!".  "I'm leaving today"  Rica Koyanagi  PTA [facility] M-F          [phone] Weekend pager [phone]

## 2022-05-05 DIAGNOSIS — I5032 Chronic diastolic (congestive) heart failure: Secondary | ICD-10-CM | POA: Diagnosis not present

## 2022-05-05 DIAGNOSIS — M6289 Other specified disorders of muscle: Secondary | ICD-10-CM | POA: Diagnosis not present

## 2022-05-05 DIAGNOSIS — M6281 Muscle weakness (generalized): Secondary | ICD-10-CM | POA: Diagnosis not present

## 2022-05-05 DIAGNOSIS — R2681 Unsteadiness on feet: Secondary | ICD-10-CM | POA: Diagnosis not present

## 2022-05-07 DIAGNOSIS — L0390 Cellulitis, unspecified: Secondary | ICD-10-CM | POA: Diagnosis not present

## 2022-05-07 DIAGNOSIS — M79606 Pain in leg, unspecified: Secondary | ICD-10-CM | POA: Diagnosis not present

## 2022-05-08 DIAGNOSIS — M6281 Muscle weakness (generalized): Secondary | ICD-10-CM | POA: Diagnosis not present

## 2022-05-08 DIAGNOSIS — R2681 Unsteadiness on feet: Secondary | ICD-10-CM | POA: Diagnosis not present

## 2022-05-08 DIAGNOSIS — M6289 Other specified disorders of muscle: Secondary | ICD-10-CM | POA: Diagnosis not present

## 2022-05-08 DIAGNOSIS — I5032 Chronic diastolic (congestive) heart failure: Secondary | ICD-10-CM | POA: Diagnosis not present

## 2022-05-09 ENCOUNTER — Non-Acute Institutional Stay: Payer: Medicare Other | Admitting: Hospice

## 2022-05-09 DIAGNOSIS — L03115 Cellulitis of right lower limb: Secondary | ICD-10-CM

## 2022-05-09 DIAGNOSIS — L03116 Cellulitis of left lower limb: Secondary | ICD-10-CM

## 2022-05-09 DIAGNOSIS — K5901 Slow transit constipation: Secondary | ICD-10-CM

## 2022-05-09 DIAGNOSIS — R609 Edema, unspecified: Secondary | ICD-10-CM

## 2022-05-09 DIAGNOSIS — R52 Pain, unspecified: Secondary | ICD-10-CM

## 2022-05-09 DIAGNOSIS — Z515 Encounter for palliative care: Secondary | ICD-10-CM

## 2022-05-09 NOTE — Progress Notes (Signed)
[facility] Consult Note Telephone: [phone]  Fax: [phone]  PATIENT NAME: Denise Kelly [place] [id number] [phone] (home)  DOB: Aug 02, 1940 MRN: [id number]  PRIMARY CARE PROVIDER:    [name], MD,  [address] [place] [id number] [phone]  REFERRING PROVIDER:   Dr. Gwinda Passe  RESPONSIBLE PARTY:   Self Contact Information   None on File      I met face to face with patient in the facility. Visit to build trust and highlight Palliative Medicine as specialized medical care for people living with serious illness, aimed at facilitating better quality of life through symptoms relief, assisting with advance care planning and complex medical decision making.   Patient shared she lost her daughter, her only child, to suicide last year.  Therapeutic listening and ample emotional support provided.  Visit consisted of counseling and education dealing with the complex and emotionally intense issues of symptom management and palliative care in the setting of serious and potentially life-threatening illness. Palliative care team will continue to support patient, patient's family, and medical team.  ASSESSMENT AND / RECOMMENDATIONS:   Advance Care Planning: Our advance care planning conversation included a discussion about:    The value and importance of advance care planning  Difference between Hospice and Palliative care Exploration of goals of care in the event of a sudden injury or illness  Identification and preparation of a healthcare agent  Review and updating or creation of an  advance directive document . Decision not to resuscitate or to de-escalate disease focused treatments due to poor prognosis.  CODE STATUS: Patient affirmed she is Full code.   Goals of Care: Goals include to maximize quality of life and symptom management  I spent  16   minutes providing this initial consultation. More than 50% of the time in this consultation was spent on counseling patient and coordinating communication. --------------------------------------------------------------------------------------------------------------------------------------  Symptom Management/Plan: Weakness: Worsened generalized weakness following recent hospitalization for peripheral edema and weakness.  PT/OT for strengthening and mobility. Peripheral edema: Recent exacerbation related to CHF, in the context of hereditary lymphedema of legs.  Patient admitted twice this month for peripheral edema.  Treated and discharged to SNF last week for acute rehab.  Continue Lasix as ordered.  No added salt.  Monitor weight per facility protocol and report weight gain of 2 pounds in a day or 5 pounds in a week. Cellulitis: Acute/recurrent cellulitis of bil lower extremity. Continue dicloxacillin as ordered and to finish.  Pain: Chronic pain to bilateral knees managed with Tylenol, lidocaine patch, tramadol, Neurontin.  Constipation: Managed with Miralax, Senna S. Routine CBC CMP.  Follow up: Palliative care will continue to follow for complex medical decision making, advance care planning, and clarification of goals. Return 6 weeks or prn. Encouraged to call provider sooner with any concerns.   Family /Caregiver/Community Supports:   HOSPICE ELIGIBILITY/DIAGNOSIS: TBD  Chief Complaint: Initial Palliative care visit  HISTORY OF PRESENT ILLNESS:  Denise Kelly is a 82 y.o. year old female  with multiple morbidities requiring close monitoring and with high risk of complications and  mortality: Chronic congestive heart failure, chronic lymphedema of both legs, generalized weakness.  History of thromboembolic disease, GERD.  Patient in no acute distress or pain, endorsed occasional moderate throbbing pain in bilateral knees for which current pain regimen is effective.  Nursing with no  concerns. History obtained from review of EMR,  discussion with primary team, caregiver, family and/or Denise Kelly.  Review and summarization of Epic records shows history from other than patient. Rest of 10 point ROS asked and negative. Independent interpretation of tests and reviewed as needed, available labs, patient records, imaging, studies and related documents from the EMR.  PAST MEDICAL HISTORY:  Active Ambulatory Problems    Diagnosis Date Noted   Essential hypertension 11/10/2013   LAFB (left anterior fascicular block) 11/10/2013   Obesity, unspecified 11/10/2013   S/P total knee arthroplasty 01/02/2014   Altered mental status 02/21/2014   SIRS (systemic inflammatory response syndrome) ([facility]) 02/21/2014   Effusion of right knee 02/22/2014   Alcohol dependence ([facility]) 02/22/2014   Substance induced mood disorder ([place]) 02/22/2014   Hepatic encephalopathy ([facility])    Hypokalemia 03/14/2014   Confusion 03/14/2014   Recurrent UTI 03/14/2014   History of fracture of left hip 11/28/2015   Fall 11/28/2015   RLS (restless legs syndrome) 11/28/2015   Chronic back pain 02/25/2016   Lesion of liver 02/25/2016   Depression 05/23/2016   Chronic pain syndrome    Spondylolisthesis 07/17/2016   Bilateral lower extremity edema 07/17/2016   Unsteady gait 04/28/2017   Acute encephalopathy 09/28/2017   Constipation, slow transit 09/28/2017   Acute pulmonary embolus ([facility] [facility]) 01/04/2018   Upper GI bleed 01/23/2018   Leukocytosis 01/23/2018   Physical deconditioning 01/23/2018   UTI (urinary tract infection) 0000000   Acute metabolic encephalopathy 0000000   Cellulitis 04/20/2022   Lymphedema 04/20/2022   Personal history of thromboembolic disease A999333   History of pulmonary embolus (PE) 04/20/2022   Generalized weakness 04/28/2022   Chronic diastolic CHF (congestive heart failure) ([facility] [facility]) 04/30/2022   GERD without esophagitis 04/30/2022   Right hip pain 04/30/2022   Hereditary  lymphedema of legs 04/30/2022   FTT (failure to thrive) in adult 05/01/2022   Resolved Ambulatory Problems    Diagnosis Date Noted   Right knee pain A999333   Complicated UTI (urinary tract infection)    E coli bacteremia    Sepsis due to Escherichia coli ([facility])    Screen for STD (sexually transmitted disease)    Diarrhea 03/14/2014   Altered mental state 11/30/2014   Hip fracture ([facility]) 11/28/2015   Dysuria 11/28/2015   Urinary tract infection without hematuria 01/22/2016   Back pain    Acute encephalopathy 01/23/2016   Cellulitis 02/25/2016   Hypoxia 02/25/2016   Cellulitis of right leg    Cellulitis of right lower leg 05/23/2016   Lactic acidosis 06/14/2016   Sepsis ([facility]) 06/14/2016   Edema 07/02/2016   Oral candida 07/17/2016   Past Medical History:  Diagnosis Date   Alcohol abuse    Anxiety    Arthritis    Cancer ([place])    Complication of anesthesia 2009   Encephalopathy    GERD (gastroesophageal reflux disease)    HOH (hard of hearing)    HOH (hard of hearing)    Hypertension    Palpitations    PONV (postoperative nausea and vomiting)    Spondylolisthesis of lumbosacral region     SOCIAL HX:  Social History   Tobacco Use   Smoking status: Former    Packs/day: 1.00    Years: 20.00    Total pack years: 20.00    Types: Cigarettes    Quit date: 03/17/1992    Years since quitting: 30.1   Smokeless tobacco: Never   Tobacco comments:    quit smoking many years ago .  quit 1994  Substance  Use Topics   Alcohol use: No    Comment: unable to get alcohol per family not since September     FAMILY HX:  Family History  Problem Relation Age of Onset   Hypertension Other    Cancer Mother    Cancer Father       ALLERGIES:  Allergies  Allergen Reactions   Sulfa Antibiotics Hives, Swelling and Other (See Comments)    Facial/eye swelling    Coreg [Carvedilol] Other (See Comments)    Memory loss   Motrin [Ibuprofen] Palpitations   Toprol Xl [Metoprolol  Tartrate] Cough   Doxycycline Hyclate Other (See Comments)    HEARTBURN   Flovent Hfa [Fluticasone] Itching   Statins Other (See Comments)    MENTAL STATUS CHANGE      PERTINENT MEDICATIONS:  Outpatient Encounter Medications as of 05/09/2022  Medication Sig   acetaminophen (TYLENOL) 500 MG tablet Take 500 mg by mouth every 6 (six) hours as needed for moderate pain.   apixaban (ELIQUIS) 5 MG TABS tablet Take 5 mg by mouth 2 (two) times daily.   cyanocobalamin 1000 MCG tablet Take 1 tablet (1,000 mcg total) by mouth daily.   folic acid (FOLVITE) 1 MG tablet Take 1 tablet (1 mg total) by mouth daily.   furosemide (LASIX) 40 MG tablet Take 1 tablet (40 mg total) by mouth daily.   gabapentin (NEURONTIN) 100 MG capsule Take 1 capsule (100 mg total) by mouth 3 (three) times daily.   guaiFENesin (MUCINEX) 600 MG 12 hr tablet Take 1 tablet (600 mg total) by mouth 2 (two) times daily.   lidocaine (LIDODERM) 5 % Place 1 patch onto the skin daily. Remove & Discard patch within 12 hours or as directed by MD   Magnesium Oxide 400 MG CAPS Take 1 capsule (400 mg total) by mouth every Monday, Wednesday, and Friday.   melatonin 3 MG TABS tablet Take 1 tablet (3 mg total) by mouth at bedtime.   Multiple Vitamin (MULTIVITAMIN WITH MINERALS) TABS tablet Take 1 tablet by mouth daily.   ondansetron (ZOFRAN) 4 MG tablet Take 4 mg by mouth every 8 (eight) hours as needed for nausea or vomiting.   pantoprazole (PROTONIX) 40 MG tablet Take 1 tablet (40 mg total) by mouth daily.   polyethylene glycol (MIRALAX / GLYCOLAX) 17 g packet Take 17 g by mouth daily. Hold if diarrhea   potassium chloride SA (KLOR-CON M) 20 MEQ tablet Take 1 tablet (20 mEq total) by mouth daily.   pramipexole (MIRAPEX) 0.125 MG tablet Take 0.125 mg by mouth daily.   senna-docusate (SENOKOT-S) 8.6-50 MG tablet Take 1 tablet by mouth at bedtime. Hold if diarrhea   thiamine (VITAMIN B1) 100 MG tablet Take 1 tablet (100 mg total) by mouth daily.    traZODone (DESYREL) 100 MG tablet Take 1 tablet (100 mg total) by mouth at bedtime as needed for sleep.   No facility-administered encounter medications on file as of 05/09/2022.     Thank you for the opportunity to participate in the care of Ms. Westerlund.  The palliative care team will continue to follow. Please call our office at [phone] if we can be of additional assistance.   Note: Portions of this note were generated with [employer]. Dictation errors may occur despite best attempts at proofreading.  Teodoro Spray, NP

## 2022-05-12 DIAGNOSIS — M25551 Pain in right hip: Secondary | ICD-10-CM | POA: Diagnosis not present

## 2022-05-12 DIAGNOSIS — I5032 Chronic diastolic (congestive) heart failure: Secondary | ICD-10-CM | POA: Diagnosis not present

## 2022-05-12 DIAGNOSIS — R059 Cough, unspecified: Secondary | ICD-10-CM | POA: Diagnosis not present

## 2022-05-12 DIAGNOSIS — R2681 Unsteadiness on feet: Secondary | ICD-10-CM | POA: Diagnosis not present

## 2022-05-12 DIAGNOSIS — M6281 Muscle weakness (generalized): Secondary | ICD-10-CM | POA: Diagnosis not present

## 2022-05-12 DIAGNOSIS — R601 Generalized edema: Secondary | ICD-10-CM | POA: Diagnosis not present

## 2022-05-12 DIAGNOSIS — I890 Lymphedema, not elsewhere classified: Secondary | ICD-10-CM | POA: Diagnosis not present

## 2022-05-12 DIAGNOSIS — M6289 Other specified disorders of muscle: Secondary | ICD-10-CM | POA: Diagnosis not present

## 2022-05-13 DIAGNOSIS — M79606 Pain in leg, unspecified: Secondary | ICD-10-CM | POA: Diagnosis not present

## 2022-05-13 DIAGNOSIS — L0390 Cellulitis, unspecified: Secondary | ICD-10-CM | POA: Diagnosis not present

## 2022-05-13 DIAGNOSIS — R059 Cough, unspecified: Secondary | ICD-10-CM | POA: Diagnosis not present

## 2022-05-13 DIAGNOSIS — M25551 Pain in right hip: Secondary | ICD-10-CM | POA: Diagnosis not present

## 2022-05-14 DIAGNOSIS — B3731 Acute candidiasis of vulva and vagina: Secondary | ICD-10-CM | POA: Diagnosis not present

## 2022-05-14 DIAGNOSIS — L0390 Cellulitis, unspecified: Secondary | ICD-10-CM | POA: Diagnosis not present

## 2022-05-16 DIAGNOSIS — G2581 Restless legs syndrome: Secondary | ICD-10-CM | POA: Diagnosis not present

## 2022-05-16 DIAGNOSIS — I890 Lymphedema, not elsewhere classified: Secondary | ICD-10-CM | POA: Diagnosis not present

## 2022-05-16 DIAGNOSIS — Z86718 Personal history of other venous thrombosis and embolism: Secondary | ICD-10-CM | POA: Diagnosis not present

## 2022-05-16 DIAGNOSIS — L0390 Cellulitis, unspecified: Secondary | ICD-10-CM | POA: Diagnosis not present

## 2022-05-20 ENCOUNTER — Other Ambulatory Visit: Payer: Self-pay

## 2022-05-20 ENCOUNTER — Encounter (HOSPITAL_COMMUNITY): Payer: Self-pay

## 2022-05-20 ENCOUNTER — Inpatient Hospital Stay (HOSPITAL_COMMUNITY)
Admission: EM | Admit: 2022-05-20 | Discharge: 2022-05-24 | DRG: 607 | Disposition: A | Discharge status: Home Health | Payer: Medicare Other | Attending: Internal Medicine | Admitting: Internal Medicine

## 2022-05-20 ENCOUNTER — Emergency Department (HOSPITAL_COMMUNITY): Payer: Medicare Other

## 2022-05-20 DIAGNOSIS — G894 Chronic pain syndrome: Secondary | ICD-10-CM | POA: Diagnosis not present

## 2022-05-20 DIAGNOSIS — R531 Weakness: Secondary | ICD-10-CM | POA: Diagnosis not present

## 2022-05-20 DIAGNOSIS — Q820 Hereditary lymphedema: Secondary | ICD-10-CM | POA: Diagnosis not present

## 2022-05-20 DIAGNOSIS — Z5329 Procedure and treatment not carried out because of patient's decision for other reasons: Secondary | ICD-10-CM | POA: Diagnosis present

## 2022-05-20 DIAGNOSIS — I890 Lymphedema, not elsewhere classified: Secondary | ICD-10-CM | POA: Diagnosis not present

## 2022-05-20 DIAGNOSIS — I110 Hypertensive heart disease with heart failure: Secondary | ICD-10-CM | POA: Diagnosis not present

## 2022-05-20 DIAGNOSIS — Z981 Arthrodesis status: Secondary | ICD-10-CM

## 2022-05-20 DIAGNOSIS — Z8249 Family history of ischemic heart disease and other diseases of the circulatory system: Secondary | ICD-10-CM | POA: Diagnosis not present

## 2022-05-20 DIAGNOSIS — R739 Hyperglycemia, unspecified: Secondary | ICD-10-CM | POA: Diagnosis present

## 2022-05-20 DIAGNOSIS — Z79890 Hormone replacement therapy (postmenopausal): Secondary | ICD-10-CM | POA: Diagnosis not present

## 2022-05-20 DIAGNOSIS — Z87891 Personal history of nicotine dependence: Secondary | ICD-10-CM | POA: Diagnosis not present

## 2022-05-20 DIAGNOSIS — Z882 Allergy status to sulfonamides status: Secondary | ICD-10-CM

## 2022-05-20 DIAGNOSIS — Z79899 Other long term (current) drug therapy: Secondary | ICD-10-CM

## 2022-05-20 DIAGNOSIS — Z86718 Personal history of other venous thrombosis and embolism: Secondary | ICD-10-CM

## 2022-05-20 DIAGNOSIS — Z7901 Long term (current) use of anticoagulants: Secondary | ICD-10-CM | POA: Diagnosis not present

## 2022-05-20 DIAGNOSIS — L0390 Cellulitis, unspecified: Secondary | ICD-10-CM | POA: Diagnosis present

## 2022-05-20 DIAGNOSIS — L03119 Cellulitis of unspecified part of limb: Secondary | ICD-10-CM

## 2022-05-20 DIAGNOSIS — L03115 Cellulitis of right lower limb: Secondary | ICD-10-CM | POA: Diagnosis present

## 2022-05-20 DIAGNOSIS — K219 Gastro-esophageal reflux disease without esophagitis: Secondary | ICD-10-CM | POA: Diagnosis present

## 2022-05-20 DIAGNOSIS — M79605 Pain in left leg: Secondary | ICD-10-CM | POA: Diagnosis not present

## 2022-05-20 DIAGNOSIS — Z86711 Personal history of pulmonary embolism: Secondary | ICD-10-CM

## 2022-05-20 DIAGNOSIS — M79604 Pain in right leg: Secondary | ICD-10-CM | POA: Diagnosis not present

## 2022-05-20 DIAGNOSIS — I10 Essential (primary) hypertension: Secondary | ICD-10-CM | POA: Diagnosis not present

## 2022-05-20 DIAGNOSIS — R5381 Other malaise: Secondary | ICD-10-CM | POA: Diagnosis present

## 2022-05-20 DIAGNOSIS — I5032 Chronic diastolic (congestive) heart failure: Secondary | ICD-10-CM | POA: Diagnosis present

## 2022-05-20 DIAGNOSIS — D696 Thrombocytopenia, unspecified: Secondary | ICD-10-CM | POA: Diagnosis not present

## 2022-05-20 DIAGNOSIS — Z881 Allergy status to other antibiotic agents status: Secondary | ICD-10-CM

## 2022-05-20 DIAGNOSIS — Z0389 Encounter for observation for other suspected diseases and conditions ruled out: Secondary | ICD-10-CM | POA: Diagnosis not present

## 2022-05-20 DIAGNOSIS — E876 Hypokalemia: Secondary | ICD-10-CM | POA: Diagnosis not present

## 2022-05-20 DIAGNOSIS — R609 Edema, unspecified: Secondary | ICD-10-CM | POA: Diagnosis not present

## 2022-05-20 DIAGNOSIS — I4510 Unspecified right bundle-branch block: Secondary | ICD-10-CM | POA: Diagnosis present

## 2022-05-20 DIAGNOSIS — G2581 Restless legs syndrome: Secondary | ICD-10-CM | POA: Diagnosis present

## 2022-05-20 DIAGNOSIS — Z888 Allergy status to other drugs, medicaments and biological substances status: Secondary | ICD-10-CM

## 2022-05-20 DIAGNOSIS — L03116 Cellulitis of left lower limb: Secondary | ICD-10-CM | POA: Diagnosis not present

## 2022-05-20 DIAGNOSIS — R000 Tachycardia, unspecified: Secondary | ICD-10-CM | POA: Diagnosis not present

## 2022-05-20 LAB — COMPREHENSIVE METABOLIC PANEL
ALT: 19 U/L (ref 0–44)
AST: 32 U/L (ref 15–41)
Albumin: 3.6 g/dL (ref 3.5–5.0)
Alkaline Phosphatase: 95 U/L (ref 38–126)
Anion gap: 8 (ref 5–15)
BUN: 11 mg/dL (ref 8–23)
CO2: 29 mmol/L (ref 22–32)
Calcium: 8.5 mg/dL — ABNORMAL LOW (ref 8.9–10.3)
Chloride: 100 mmol/L (ref 98–111)
Creatinine, Ser: 0.76 mg/dL (ref 0.44–1.00)
GFR, Estimated: >60 mL/min (ref >60)
Glucose, Bld: 100 mg/dL — ABNORMAL HIGH (ref 70–99)
Potassium: 3.2 mmol/L — ABNORMAL LOW (ref 3.5–5.1)
Sodium: 137 mmol/L (ref 135–145)
Total Bilirubin: 0.7 mg/dL (ref 0.3–1.2)
Total Protein: 7.1 g/dL (ref 6.5–8.1)

## 2022-05-20 LAB — APTT: aPTT: 33 seconds (ref 24–36)

## 2022-05-20 LAB — CBC WITH DIFFERENTIAL/PLATELET
Abs Immature Granulocytes: 0.01 10*3/uL (ref 0.00–0.07)
Basophils Absolute: 0 10*3/uL (ref 0.0–0.1)
Basophils Relative: 1 %
Eosinophils Absolute: 0 10*3/uL (ref 0.0–0.5)
Eosinophils Relative: 1 %
HCT: 43 % (ref 36.0–46.0)
Hemoglobin: 14.3 g/dL (ref 12.0–15.0)
Immature Granulocytes: 0 %
Lymphocytes Relative: 39 %
Lymphs Abs: 1.6 10*3/uL (ref 0.7–4.0)
MCH: 28.5 pg (ref 26.0–34.0)
MCHC: 33.3 g/dL (ref 30.0–36.0)
MCV: 85.7 fL (ref 80.0–100.0)
Monocytes Absolute: 0.4 10*3/uL (ref 0.1–1.0)
Monocytes Relative: 10 %
Neutro Abs: 2.1 10*3/uL (ref 1.7–7.7)
Neutrophils Relative %: 49 %
Platelets: 149 10*3/uL — ABNORMAL LOW (ref 150–400)
RBC: 5.02 MIL/uL (ref 3.87–5.11)
RDW: 16.1 % — ABNORMAL HIGH (ref 11.5–15.5)
WBC: 4.2 10*3/uL (ref 4.0–10.5)
nRBC: 0 % (ref 0.0–0.2)

## 2022-05-20 LAB — LACTIC ACID, PLASMA: Lactic Acid, Venous: 1.3 mmol/L (ref 0.5–1.9)

## 2022-05-20 LAB — PROTIME-INR
INR: 1.1 (ref 0.8–1.2)
Prothrombin Time: 14.5 seconds (ref 11.4–15.2)

## 2022-05-20 LAB — BRAIN NATRIURETIC PEPTIDE: B Natriuretic Peptide: 12.2 pg/mL (ref 0.0–100.0)

## 2022-05-20 MED ORDER — FENTANYL CITRATE PF 50 MCG/ML IJ SOSY: 50.0000 ug | PREFILLED_SYRINGE | Freq: Once | INTRAMUSCULAR | Status: DC

## 2022-05-20 MED ORDER — MORPHINE SULFATE (PF) 4 MG/ML IV SOLN
4.0000 mg | Freq: Once | INTRAVENOUS | Status: AC
  Administered 2022-05-20: 4 mg via INTRAVENOUS
  Filled 2022-05-20: qty 1

## 2022-05-20 MED ORDER — SODIUM CHLORIDE 0.9 % IV SOLN
1.0000 g | Freq: Once | INTRAVENOUS | Status: AC
  Filled 2022-05-20: qty 10

## 2022-05-20 MED ORDER — POTASSIUM CHLORIDE CRYS ER 20 MEQ PO TBCR
40.0000 meq | EXTENDED_RELEASE_TABLET | Freq: Once | ORAL | Status: AC
  Administered 2022-05-20: 40 meq via ORAL
  Filled 2022-05-20: qty 2

## 2022-05-20 NOTE — ED Notes (Signed)
Pt refused rectal temperature. Rn and PA notified

## 2022-05-20 NOTE — ED Provider Notes (Signed)
[facility] Provider Note   CSN: [id number] Arrival date & time: 05/20/22  2110     History Chief Complaint  Patient presents with   Leg Pain    Denise Kelly is a 82 y.o. female with history of hypertension, lymphedema, PE on Eliquis, CHF, failure to thrive presents emerged from today for evaluation of worsening leg pain and swelling.  Patient was just admitted here on 2/12/-2/16 for similar presentation and was discharged home to SNF palliative care follow-up.  She reports that she was discharged a few weeks ago from the SNF and for the past few days she has had worsening pain in her legs as well as redness.  She has not tried any medication at home.  She is unknown if she has any fevers but denies that she has any chest pain or shortness of breath.  She reports that she is ambulatory with a walker at home.  Patient is A&Ox4.   Leg Pain Associated symptoms: no fever        Home Medications Prior to Admission medications   Medication Sig Start Date End Date Taking? Authorizing Provider  acetaminophen (TYLENOL) 500 MG tablet Take 500 mg by mouth every 6 (six) hours as needed for moderate pain.    [provider]  apixaban (ELIQUIS) 5 MG TABS tablet Take 5 mg by mouth 2 (two) times daily.    [provider]  cyanocobalamin 1000 MCG tablet Take 1 tablet (1,000 mcg total) by mouth daily. 05/03/22   Florencia Reasons, MD  folic acid (FOLVITE) 1 MG tablet Take 1 tablet (1 mg total) by mouth daily. 04/23/22 08/01/22  Pokhrel, Corrie Mckusick, MD  furosemide (LASIX) 40 MG tablet Take 1 tablet (40 mg total) by mouth daily. 04/23/22 07/22/22  Pokhrel, Corrie Mckusick, MD  gabapentin (NEURONTIN) 100 MG capsule Take 1 capsule (100 mg total) by mouth 3 (three) times daily. 05/02/22   Florencia Reasons, MD  guaiFENesin (MUCINEX) 600 MG 12 hr tablet Take 1 tablet (600 mg total) by mouth 2 (two) times daily. 05/02/22 05/02/23  Florencia Reasons, MD  lidocaine (LIDODERM) 5 % Place 1 patch  onto the skin daily. Remove & Discard patch within 12 hours or as directed by MD 05/02/22   Florencia Reasons, MD  Magnesium Oxide 400 MG CAPS Take 1 capsule (400 mg total) by mouth every Monday, Wednesday, and Friday. 05/02/22   Florencia Reasons, MD  melatonin 3 MG TABS tablet Take 1 tablet (3 mg total) by mouth at bedtime. 05/02/22   Florencia Reasons, MD  Multiple Vitamin (MULTIVITAMIN WITH MINERALS) TABS tablet Take 1 tablet by mouth daily. 04/23/22   Pokhrel, Corrie Mckusick, MD  ondansetron (ZOFRAN) 4 MG tablet Take 4 mg by mouth every 8 (eight) hours as needed for nausea or vomiting.    [provider]  pantoprazole (PROTONIX) 40 MG tablet Take 1 tablet (40 mg total) by mouth daily. 05/03/22   Florencia Reasons, MD  polyethylene glycol (MIRALAX / GLYCOLAX) 17 g packet Take 17 g by mouth daily. Hold if diarrhea 05/03/22   Florencia Reasons, MD  potassium chloride SA (KLOR-CON M) 20 MEQ tablet Take 1 tablet (20 mEq total) by mouth daily. 05/02/22   Florencia Reasons, MD  pramipexole (MIRAPEX) 0.125 MG tablet Take 0.125 mg by mouth daily. 04/15/22   [provider]  senna-docusate (SENOKOT-S) 8.6-50 MG tablet Take 1 tablet by mouth at bedtime. Hold if diarrhea 05/02/22   Florencia Reasons, MD  thiamine (VITAMIN B1)  100 MG tablet Take 1 tablet (100 mg total) by mouth daily. 04/23/22 08/01/22  Pokhrel, Corrie Mckusick, MD  traZODone (DESYREL) 100 MG tablet Take 1 tablet (100 mg total) by mouth at bedtime as needed for sleep. 05/02/22   Florencia Reasons, MD      Allergies    Sulfa antibiotics, Coreg [carvedilol], Motrin [ibuprofen], Toprol xl [metoprolol tartrate], Doxycycline hyclate, Flovent hfa [fluticasone], and Statins    Review of Systems   Review of Systems  Constitutional:  Negative for chills and fever.  Respiratory:  Negative for shortness of breath.   Cardiovascular:  Positive for leg swelling. Negative for chest pain.  Gastrointestinal:  Negative for abdominal pain, diarrhea, nausea and vomiting.  Musculoskeletal:  Positive for myalgias.    Physical  Exam Updated Vital Signs Temp 98.2 F (36.8 C) (Oral)  Physical Exam Vitals and nursing note reviewed.  Constitutional:      General: She is not in acute distress.    Appearance: Normal appearance. She is not ill-appearing or toxic-appearing.     Comments: On phone, in no acute distress. Verbally abusive to myself and staff.  Eyes:     General: No scleral icterus. Cardiovascular:     Rate and Rhythm: Tachycardia present.  Pulmonary:     Effort: Pulmonary effort is normal. No respiratory distress.  Musculoskeletal:     Right lower leg: Edema present.     Left lower leg: Edema present.     Comments: My exam is limited due to patient's cooperation.  She would not allow me to touch her legs.  From my initial examination before this statement, her legs do feel hot to touch.  She has some worsening erythema as seen from her picture previously from her last ER visit.  She would not allow me to test for any edema or to do any other evaluations on her legs or check for pulses given pain.  Her legs do appear edematous and she does have a history of lymphedema  Skin:    General: Skin is dry.  Neurological:     General: No focal deficit present.     Mental Status: She is alert. Mental status is at baseline.  Psychiatric:        Mood and Affect: Mood normal.     ED Results / Procedures / Treatments   Labs (all labs ordered are listed, but only abnormal results are displayed) Labs Reviewed  CULTURE, BLOOD (ROUTINE X 2)  CULTURE, BLOOD (ROUTINE X 2)  LACTIC ACID, PLASMA  LACTIC ACID, PLASMA  COMPREHENSIVE METABOLIC PANEL  CBC WITH DIFFERENTIAL/PLATELET  PROTIME-INR  APTT  URINALYSIS, W/ REFLEX TO CULTURE (INFECTION SUSPECTED)    EKG None  Radiology DG Chest Port 1 View  Result Date: 05/20/2022 CLINICAL DATA:  Possible sepsis EXAM: PORTABLE CHEST 1 VIEW COMPARISON:  04/29/2022 FINDINGS: Cardiac shadow is stable. The lungs are well aerated bilaterally. No focal infiltrate or sizable  effusion is seen. No bony abnormality is noted. IMPRESSION: No active disease. Electronically Signed   By: Inez Catalina M.D.   On: 05/20/2022 22:18    Procedures Procedures   Medications Ordered in ED Medications  morphine (PF) 4 MG/ML injection 4 mg (4 mg Intravenous Given 05/20/22 2246)    ED Course/ Medical Decision Making/ A&P                           Medical Decision Making Amount and/or Complexity of Data Reviewed Labs: ordered. Radiology:  ordered. ECG/medicine tests: ordered.  Risk Prescription drug management.    82 y.o. female presents to the ER for evaluation of bilateral leg pain and swelling. Differential diagnosis includes but is not limited to cellulitis, bilateral DVTs, lymphedema, fluid overload. Vital signs normotensive, afebrile, slightly tachycardic otherwise satting well on room air without increased work of breathing. Physical exam as noted above.   Concern given patient's mild tachycardia is significant appearance of her legs.  ED evolving sepsis bundle ordered.  I independently reviewed and interpreted the patient's labs.  Lactic acid within normal limits.  CBC does show slight thrombocytopenia 149 although this is not far off from her baseline.  No leukocytosis or anemia seen.  CMP does show mildly decreased potassium 3.2.  Mildly decreased calcium 8.5.  Mildly elevated glucose at 100.  Otherwise, no electrolyte or LFT abnormality.  BNP within normal limits.  Urinalysis shows many bacteria without leukocytes, WBC, or nitrites.  PT/INR and APTT within normal limits.  Chest x-ray shows no acute cardiopulmonary process.  EKG reviewed and interpreted by attending and shows no significant change since last tracing.  Rocephin ordered for cellulitis.  Patient is given morphine for pain.  Potassium for mild hypokalemia.  Patient was finally willing to allow me to do doppler pulses however would not let me touch her legs still. Pulses heard on doppler both DP and PT  bilaterally. Dan Europe, RN and [place], [place] as witness.   Patient was given morphine and then fentanyl for pain.  She continues to have pain in her legs however refuses evaluation of them. I consulted her for admission and spoke with Dr. Jennette Kettle.  He does not think the patient meets criteria for admission.  Does think the patient would benefit from some IV Dalvance. He does think the patient needs a SNF and the patient is amenable at this time.   After Dr. Alcario Drought has seen in the patient, he would like to admit the patient and no longer give Dalvance. He will admit the patient for pain control and cellulitis. Patient willing for admission.   I do not think this is bilateral necrotizing fascitis given that she has had this pain before with cellulitis. Was not sudden onset. I doubt bilateral DVTs as the patient reports that she has been on her blood thinners and reports that she has been complaint without any missed doses.   Portions of this report may have been transcribed using voice recognition software. Every effort was made to ensure accuracy; however, inadvertent computerized transcription errors may be present.   Final Clinical Impression(s) / ED Diagnoses Final diagnoses:  Cellulitis    Rx / DC Orders ED Discharge Orders          Ordered    Ambulatory referral to Infectious Disease  Status:  Canceled       Comments: Cellulitis patient:  Received dalbavancin on 05/21/2022.   05/21/22 0047              Sherrell Puller, PA-C 05/21/22 [place], DO 05/21/22 1511

## 2022-05-20 NOTE — ED Notes (Signed)
Pt declined 2nd set of culture, stated she does not want to be stuck twice.

## 2022-05-20 NOTE — ED Triage Notes (Signed)
Pt came via EMS for leg pain. EMS vitals.164/108 BP, 100%,  94 Pulse.

## 2022-05-21 ENCOUNTER — Emergency Department (HOSPITAL_COMMUNITY): Payer: Medicare Other

## 2022-05-21 DIAGNOSIS — Q820 Hereditary lymphedema: Secondary | ICD-10-CM | POA: Diagnosis not present

## 2022-05-21 DIAGNOSIS — L03116 Cellulitis of left lower limb: Secondary | ICD-10-CM | POA: Diagnosis not present

## 2022-05-21 DIAGNOSIS — M79604 Pain in right leg: Secondary | ICD-10-CM | POA: Diagnosis present

## 2022-05-21 DIAGNOSIS — I5032 Chronic diastolic (congestive) heart failure: Secondary | ICD-10-CM

## 2022-05-21 DIAGNOSIS — E876 Hypokalemia: Secondary | ICD-10-CM

## 2022-05-21 DIAGNOSIS — R531 Weakness: Secondary | ICD-10-CM | POA: Diagnosis not present

## 2022-05-21 DIAGNOSIS — L03115 Cellulitis of right lower limb: Secondary | ICD-10-CM

## 2022-05-21 DIAGNOSIS — Z86718 Personal history of other venous thrombosis and embolism: Secondary | ICD-10-CM

## 2022-05-21 LAB — LACTIC ACID, PLASMA: Lactic Acid, Venous: 0.7 mmol/L (ref 0.5–1.9)

## 2022-05-21 LAB — CBC
HCT: 40.6 % (ref 36.0–46.0)
Hemoglobin: 13.3 g/dL (ref 12.0–15.0)
MCH: 28.3 pg (ref 26.0–34.0)
MCHC: 32.8 g/dL (ref 30.0–36.0)
MCV: 86.4 fL (ref 80.0–100.0)
Platelets: 138 10*3/uL — ABNORMAL LOW (ref 150–400)
RBC: 4.7 MIL/uL (ref 3.87–5.11)
RDW: 16.3 % — ABNORMAL HIGH (ref 11.5–15.5)
WBC: 5 10*3/uL (ref 4.0–10.5)
nRBC: 0 % (ref 0.0–0.2)

## 2022-05-21 LAB — URINALYSIS, W/ REFLEX TO CULTURE (INFECTION SUSPECTED)
Bilirubin Urine: NEGATIVE
Glucose, UA: NEGATIVE mg/dL
Ketones, ur: NEGATIVE mg/dL
Leukocytes,Ua: NEGATIVE
Nitrite: NEGATIVE
Protein, ur: NEGATIVE mg/dL
Specific Gravity, Urine: 1.011 (ref 1.005–1.030)
pH: 6 (ref 5.0–8.0)

## 2022-05-21 LAB — BASIC METABOLIC PANEL
Anion gap: 7 (ref 5–15)
BUN: 10 mg/dL (ref 8–23)
CO2: 28 mmol/L (ref 22–32)
Calcium: 8.2 mg/dL — ABNORMAL LOW (ref 8.9–10.3)
Chloride: 103 mmol/L (ref 98–111)
Creatinine, Ser: 0.72 mg/dL (ref 0.44–1.00)
GFR, Estimated: >60 mL/min (ref >60)
Glucose, Bld: 100 mg/dL — ABNORMAL HIGH (ref 70–99)
Potassium: 3.3 mmol/L — ABNORMAL LOW (ref 3.5–5.1)
Sodium: 138 mmol/L (ref 135–145)

## 2022-05-21 LAB — MRSA NEXT GEN BY PCR, NASAL: MRSA by PCR Next Gen: NOT DETECTED

## 2022-05-21 MED ORDER — PRAMIPEXOLE DIHYDROCHLORIDE 0.25 MG PO TABS
0.1250 mg | ORAL_TABLET | Freq: Every day | ORAL | Status: DC
  Administered 2022-05-21 – 2022-05-23 (×3): 0.125 mg via ORAL
  Filled 2022-05-21: qty 0.5

## 2022-05-21 MED ORDER — TRAZODONE HCL 100 MG PO TABS
100.0000 mg | ORAL_TABLET | Freq: Every evening | ORAL | Status: DC | PRN
  Administered 2022-05-21 – 2022-05-23 (×3): 100 mg via ORAL
  Filled 2022-05-21 (×3): qty 1

## 2022-05-21 MED ORDER — FENTANYL CITRATE PF 50 MCG/ML IJ SOSY
12.5000 ug | PREFILLED_SYRINGE | INTRAMUSCULAR | Status: DC | PRN
  Administered 2022-05-21: 25 ug via INTRAVENOUS
  Administered 2022-05-21 (×4): 50 ug via INTRAVENOUS
  Administered 2022-05-21: 25 ug via INTRAVENOUS
  Administered 2022-05-22: 50 ug via INTRAVENOUS
  Filled 2022-05-21 (×7): qty 1

## 2022-05-21 MED ORDER — PANTOPRAZOLE SODIUM 40 MG PO TBEC
40.0000 mg | DELAYED_RELEASE_TABLET | Freq: Every day | ORAL | Status: DC
  Administered 2022-05-22 – 2022-05-24 (×3): 40 mg via ORAL
  Filled 2022-05-21 (×3): qty 1

## 2022-05-21 MED ORDER — HYDRALAZINE HCL 20 MG/ML IJ SOLN: 10.0000 mg | Freq: Four times a day (QID) | INTRAMUSCULAR | Status: DC | PRN

## 2022-05-21 MED ORDER — POTASSIUM CHLORIDE CRYS ER 20 MEQ PO TBCR
40.0000 meq | EXTENDED_RELEASE_TABLET | Freq: Once | ORAL | Status: AC
  Administered 2022-05-21: 40 meq via ORAL
  Filled 2022-05-21: qty 2

## 2022-05-21 MED ORDER — FUROSEMIDE 40 MG PO TABS
40.0000 mg | ORAL_TABLET | Freq: Every day | ORAL | Status: DC
  Administered 2022-05-21 – 2022-05-22 (×2): 40 mg via ORAL
  Filled 2022-05-21 (×2): qty 1

## 2022-05-21 MED ORDER — POLYETHYLENE GLYCOL 3350 17 G PO PACK
17.0000 g | PACK | Freq: Every day | ORAL | Status: DC
  Administered 2022-05-21 – 2022-05-22 (×2): 17 g via ORAL
  Filled 2022-05-21 (×3): qty 1

## 2022-05-21 MED ORDER — FENTANYL CITRATE PF 50 MCG/ML IJ SOSY
50.0000 ug | PREFILLED_SYRINGE | Freq: Once | INTRAMUSCULAR | Status: AC
  Administered 2022-05-21: 50 ug via INTRAVENOUS
  Filled 2022-05-21: qty 1

## 2022-05-21 MED ORDER — SODIUM CHLORIDE 0.9 % IV SOLN
1.0000 g | INTRAVENOUS | Status: DC
  Administered 2022-05-21: 1 g via INTRAVENOUS
  Filled 2022-05-21 (×2): qty 10

## 2022-05-21 MED ORDER — ONDANSETRON HCL 4 MG PO TABS
4.0000 mg | ORAL_TABLET | Freq: Four times a day (QID) | ORAL | Status: DC | PRN
  Administered 2022-05-21 – 2022-05-24 (×8): 4 mg via ORAL
  Filled 2022-05-21 (×8): qty 1

## 2022-05-21 MED ORDER — DEXTROSE 5 % IV SOLN
1500.0000 mg | Freq: Once | INTRAVENOUS | Status: DC
  Filled 2022-05-21: qty 75

## 2022-05-21 MED ORDER — DIPHENHYDRAMINE HCL 25 MG PO CAPS
25.0000 mg | ORAL_CAPSULE | Freq: Four times a day (QID) | ORAL | Status: DC | PRN
  Administered 2022-05-21 – 2022-05-24 (×7): 25 mg via ORAL
  Filled 2022-05-21 (×7): qty 1

## 2022-05-21 MED ORDER — ACETAMINOPHEN 325 MG PO TABS
650.0000 mg | ORAL_TABLET | Freq: Four times a day (QID) | ORAL | Status: DC | PRN
  Administered 2022-05-21 – 2022-05-23 (×2): 650 mg via ORAL
  Filled 2022-05-21 (×2): qty 2

## 2022-05-21 MED ORDER — ONDANSETRON HCL 4 MG/2ML IJ SOLN: 4.0000 mg | Freq: Four times a day (QID) | INTRAMUSCULAR | Status: DC | PRN

## 2022-05-21 MED ORDER — APIXABAN 5 MG PO TABS
5.0000 mg | ORAL_TABLET | Freq: Two times a day (BID) | ORAL | Status: DC
  Administered 2022-05-21 – 2022-05-24 (×8): 5 mg via ORAL
  Filled 2022-05-21 (×8): qty 1

## 2022-05-21 MED ORDER — ACETAMINOPHEN 650 MG RE SUPP: 650.0000 mg | Freq: Four times a day (QID) | RECTAL | Status: DC | PRN

## 2022-05-21 NOTE — Evaluation (Signed)
Occupational Therapy Evaluation Patient Details Name: Denise Kelly MRN: [id number] DOB: November 15, 1940 Today's Date: 05/21/2022   History of Present Illness Patient is a 82 year old female who presented with pain a in bilateral lower extremities. Dx of recurrent cellulitis.  Of note patient was admitted from 2/4 to 2/7 with UTI and cellulitis with patient transitioning home after declining to go SNF, then admitted again 2/2-2/16 with BLE cellulitis, DC to SNF. PMH:  chronic pain syndrome, medical noncompliance, R TKA, L TKA, lumbar fusion.   Clinical Impression   Pt with multiple hospital admissions and has only been home 2 days from SNF [facility]). Pt only willing to stand a very limited time and stated she was unable to walk. Requires Mod to Max A with LB ADL @ RW level. Do not feel pt is able to safely DC home at this time and will benefit from rehab at [facility]. She will most likely need ALF after SNF and would benefit from information regarding the process of transitioning to an ALF setting. Acute OT to follow.    Recommendations for follow up therapy are one component of a multi-disciplinary discharge planning process, led by the attending physician.  Recommendations may be updated based on patient status, additional functional criteria and insurance authorization.   Follow Up Recommendations  Skilled nursing-short term rehab (<3 hours/day)     Assistance Recommended at Discharge Frequent or constant Supervision/Assistance  Patient can return home with the following A little help with walking and/or transfers;A lot of help with bathing/dressing/bathroom;Assistance with cooking/housework;Direct supervision/assist for medications management;Assist for transportation;Help with stairs or ramp for entrance;Direct supervision/assist for financial management    Functional Status Assessment  Patient has had a recent decline in their functional status and demonstrates the ability to make significant  improvements in function in a reasonable and predictable amount of time.  Equipment Recommendations  None recommended by OT    Recommendations for Other Services       Precautions / Restrictions Precautions Precautions: Fall Precaution Comments: incontinent of urine Restrictions Weight Bearing Restrictions: No      Mobility Bed Mobility               General bed mobility comments: OOB in chair    Transfers Overall transfer level: Needs assistance Equipment used: Rolling walker (2 wheels) Transfers: Sit to/from Stand, Bed to chair/wheelchair/BSC Sit to Stand: Min guard                  Balance Overall balance assessment: Needs assistance Sitting-balance support: No upper extremity supported, Feet supported Sitting balance-Leahy Scale: Good     Standing balance support: Reliant on assistive device for balance, During functional activity Standing balance-Leahy Scale: Poor                             ADL either performed or assessed with clinical judgement   ADL Overall ADL's : Needs assistance/impaired Eating/Feeding: Set up;Sitting   Grooming: Wash/dry face;Set up;Sitting Grooming Details (indicate cue type and reason): in recliner Upper Body Bathing: Minimal assistance;Sitting   Lower Body Bathing: Sitting/lateral leans;Maximal assistance   Upper Body Dressing : Minimal assistance;Sitting   Lower Body Dressing: Maximal assistance Lower Body Dressing Details (indicate cue type and reason): donning socks x2 with increased pain in BLE and noted incontience causing change in footwear multiple times during session. Toilet Transfer: Minimal assistance;Ambulation;Rolling walker (2 wheels)   Toileting- Clothing Manipulation and Hygiene: Sit to/from  stand;Total assistance Toileting - Clothing Manipulation Details (indicate cue type and reason): incontinent of urine x 2 during session     Functional mobility during ADLs: Minimal assistance (to  stand less than 20 seocnds)       Vision Baseline Vision/History: 1 Wears glasses Vision Assessment?: No apparent visual deficits     Perception     Praxis      Pertinent Vitals/Pain Pain Assessment Pain Assessment: 0-10 Faces Pain Scale: Hurts even more Pain Location: BLEs Pain Descriptors / Indicators: Sore, Burning Pain Intervention(s): Limited activity within patient's tolerance     Hand Dominance Right   Extremity/Trunk Assessment Upper Extremity Assessment Upper Extremity Assessment: Generalized weakness   Lower Extremity Assessment Lower Extremity Assessment: Defer to PT evaluation (BLE edema and erythema)   Cervical / Trunk Assessment Cervical / Trunk Assessment: Kyphotic Cervical / Trunk Exceptions: habitus   Communication Communication Communication: HOH   Cognition Arousal/Alertness: Awake/alert Behavior During Therapy: Agitated Overall Cognitive Status: No family/caregiver present to determine baseline cognitive functioning                                 General Comments: Pt would not stop eating  graham crackers the entire session, demosntrating poor ability to regulate behavior; has been home 2 days and is not able to answer how she was doing at home; when asked how she got groceries etc, she responded "[employer]"; attempted to copmlete the Short Blessed Test however pt refused to answer questions     General Comments   Pt declined to let me put the gait belt around her, stating "don't touch me"; at end of session,pt became conversational and pleasant    Exercises     Shoulder Instructions      Home Living Family/patient expects to be discharged to:: Skilled nursing facility Living Arrangements: Alone Available Help at Discharge: Neighbor;Available PRN/intermittently Type of Home: Apartment Home Access: Stairs to enter Entrance Stairs-Number of Steps: 1   Home Layout: One level     Bathroom Shower/Tub: Animal nutritionist: Standard Bathroom Accessibility: Yes How Accessible: Accessible via walker Home Equipment: [place]z (2 wheels)   Additional Comments: patient mentioned that she had some caregiver support but was unclear on how often this person assisted her. "Holly"; CM mentioned a nephew,however Ms Monty did not share that information      Prior Functioning/Environment Prior Level of Function : Needs assist       Physical Assist : Mobility (physical);ADLs (physical)   ADLs (physical): Bathing;Dressing;IADLs Mobility Comments: uses walker, reports being very fearful of falling ADLs Comments: Patient reports being independent. patient had help from daughter in past for ADLs and IADLs. noted that daughter has passed since recent admission; pt states she was managing on her own however pain was too intense        OT Problem List: Decreased strength;Decreased activity tolerance;Impaired balance (sitting and/or standing);Decreased safety awareness;Decreased knowledge of precautions;Decreased knowledge of use of DME or AE;Pain;Obesity;Decreased cognition      OT Treatment/Interventions: Self-care/ADL training;Energy conservation;DME and/or AE instruction;Therapeutic exercise;Therapeutic activities;Patient/family education;Balance training    OT Goals(Current goals can be found in the care plan section) Acute Rehab OT Goals Patient Stated Goal: To get rehab but not at [facility] OT Goal Formulation: With patient Time For Goal Achievement: 06/04/22 Potential to Achieve Goals: Fair  OT Frequency: Min 2X/week    Co-evaluation  AM-PAC OT "6 Clicks" Daily Activity     Outcome Measure Help from another person eating meals?: None Help from another person taking care of personal grooming?: A Little Help from another person toileting, which includes using toliet, bedpan, or urinal?: Total Help from another person bathing (including washing, rinsing, drying)?: A  Lot Help from another person to put on and taking off regular upper body clothing?: A Little Help from another person to put on and taking off regular lower body clothing?: A Lot 6 Click Score: 15   End of Session Equipment Utilized During Treatment: Rolling walker (2 wheels) Nurse Communication: Mobility status (request for cough medicine)  Activity Tolerance: Patient tolerated treatment well Patient left: Other (comment);in chair;with chair alarm set  OT Visit Diagnosis: Unsteadiness on feet (R26.81);Other abnormalities of gait and mobility (R26.89);Muscle weakness (generalized) (M62.81);Pain;Other symptoms and signs involving cognitive function Pain - part of body: Leg (B)                Time: [id number] OT Time Calculation (min): 22 min Charges:  OT General Charges $OT Visit: 1 Visit OT Evaluation $OT Eval Moderate Complexity: [place], OT/L   Acute OT Clinical Specialist Acute Rehabilitation Services Pager [phone] Office [phone]   [facility] 05/21/2022, 3:49 PM

## 2022-05-21 NOTE — Assessment & Plan Note (Addendum)
Documented history of hypertension Patient continues to be normotensive As needed intravenous antihypertensives for markedly elevated blood pressure

## 2022-05-21 NOTE — NC FL2 (Signed)
[place] LEVEL OF CARE FORM     IDENTIFICATION  Patient Name: Denise Kelly Birthdate: [birth year] Sex: female Admission Date (Current Location): 05/20/2022  [facility] and [place] Number:  Herbalist and Address:  [facility],  [place], [place]      Provider Number: [id number]  Attending Physician Name and Address:  Vernelle Emerald, MD  Relative Name and Phone Number:  nephew, Concepcion Living [phone]    Current Level of Care: Hospital Recommended Level of Care: [place] Prior Approval Number:    Date Approved/Denied:   PASRR Number: [id number]  Discharge Plan: SNF    Current Diagnoses: Patient Active Problem List   Diagnosis Date Noted   Bilateral cellulitis of lower leg 05/21/2022   FTT (failure to thrive) in adult 05/01/2022   Chronic diastolic CHF (congestive heart failure) ([place]) 04/30/2022   GERD without esophagitis 04/30/2022   Right hip pain 04/30/2022   Hereditary lymphedema of legs 04/30/2022   Generalized weakness 04/28/2022   Cellulitis 04/20/2022   Lymphedema 04/20/2022   Personal history of thromboembolic disease [id number]   History of pulmonary embolus (PE) 04/20/2022   UTI (urinary tract infection) 0000000   Acute metabolic encephalopathy 0000000   Upper GI bleed 01/23/2018   Leukocytosis 01/23/2018   Physical deconditioning 01/23/2018   Acute pulmonary embolus ([place]) 01/04/2018   Acute encephalopathy 09/28/2017   Constipation, slow transit 09/28/2017   Unsteady gait 04/28/2017   Spondylolisthesis 07/17/2016   Bilateral lower extremity edema 07/17/2016   Chronic pain syndrome    Depression 05/23/2016   Chronic back pain 02/25/2016   Lesion of liver 02/25/2016   History of fracture of left hip 11/28/2015   Fall 11/28/2015   RLS (restless legs syndrome) 11/28/2015   Hypokalemia 03/14/2014   Confusion 03/14/2014   Recurrent UTI 03/14/2014   Effusion of right knee  02/22/2014   Alcohol dependence ([place]) 02/22/2014   Substance induced mood disorder ([place]) 02/22/2014   Hepatic encephalopathy ([place])    Altered mental status 02/21/2014   SIRS (systemic inflammatory response syndrome) ([place]) 02/21/2014   S/P total knee arthroplasty 01/02/2014   Essential hypertension 11/10/2013   LAFB (left anterior fascicular block) 11/10/2013   Obesity, unspecified 11/10/2013    Orientation RESPIRATION BLADDER Height & Weight     Self, Time, Situation, Place  Normal Incontinent Weight: 200 lb (90.7 kg) Height:  '5\' 7"'$  (170.2 cm)  BEHAVIORAL SYMPTOMS/MOOD NEUROLOGICAL BOWEL NUTRITION STATUS      Continent Diet (Heart healthy)  AMBULATORY STATUS COMMUNICATION OF NEEDS Skin   Extensive Assist Verbally Other (Comment) (LE cellulitis)                       Personal Care Assistance Level of Assistance  Bathing, Dressing Bathing Assistance: Limited assistance Feeding assistance: Independent Dressing Assistance: Limited assistance     Functional Limitations Info  Sight, Hearing, Speech Sight Info: Impaired Hearing Info: Impaired Speech Info: Adequate    SPECIAL CARE FACTORS FREQUENCY  PT (By licensed PT), OT (By licensed OT)     PT Frequency: 5x/wk OT Frequency: 5x/wk            Contractures Contractures Info: Not present    Additional Factors Info  Allergies, Psychotropic, Code Status Code Status Info: Full Allergies Info: Sulfa Antibiotics, Coreg (Carvedilol), Motrin (Ibuprofen), Toprol Xl (Metoprolol Tartrate), Doxycycline Hyclate, Flovent Hfa (Fluticasone), Statins Psychotropic Info: see MAR         Current  Medications (05/21/2022):  This is the current hospital active medication list Current Facility-Administered Medications  Medication Dose Route Frequency Provider Last Rate Last Admin   acetaminophen (TYLENOL) tablet 650 mg  650 mg Oral Q6H PRN Etta Quill, DO   650 mg at 05/21/22 [id number]   Or   acetaminophen (TYLENOL) suppository 650  mg  650 mg Rectal Q6H PRN Etta Quill, DO       apixaban Arne [place]) tablet 5 mg  5 mg Oral BID Jennette Kettle M, DO   5 mg at 05/21/22 [id number]   cefTRIAXone (ROCEPHIN) 1 g in sodium chloride 0.9 % 100 mL IVPB  1 g Intravenous Q24H Jennette Kettle M, DO       diphenhydrAMINE (BENADRYL) capsule 25 mg  25 mg Oral Q6H PRN Etta Quill, DO   25 mg at 05/21/22 [id number]   fentaNYL (SUBLIMAZE) injection 12.5-50 mcg  12.5-50 mcg Intravenous Q2H PRN Etta Quill, DO   25 mcg at 05/21/22 1236   furosemide (LASIX) tablet 40 mg  40 mg Oral Daily Jennette Kettle M, DO   40 mg at 05/21/22 0829   ondansetron (ZOFRAN) tablet 4 mg  4 mg Oral Q6H PRN Etta Quill, DO   4 mg at 05/21/22 1326   Or   ondansetron (ZOFRAN) injection 4 mg  4 mg Intravenous Q6H PRN Etta Quill, DO       polyethylene glycol (MIRALAX / GLYCOLAX) packet 17 g  17 g Oral Daily Jennette Kettle M, DO   17 g at 05/21/22 [id number]   pramipexole (MIRAPEX) tablet 0.125 mg  0.125 mg Oral Daily Jennette Kettle M, DO   0.125 mg at 05/21/22 1009   traZODone (DESYREL) tablet 100 mg  100 mg Oral QHS PRN Etta Quill, DO         Discharge Medications: Please see discharge summary for a list of discharge medications.  Relevant Imaging Results:  Relevant Lab Results:   Additional Information SSN: [id number]  Lennart Pall, LCSW

## 2022-05-21 NOTE — Assessment & Plan Note (Addendum)
Continuing to provide Lasix to twice daily to address edema as mentioned above Patient's peripheral edema is chronic and more related to lymphedema rather than congestive heart failure. Strict input and output monitoring Daily weights Low-sodium diet

## 2022-05-21 NOTE — Assessment & Plan Note (Signed)
Continuing home regimen of daily PPI therapy.  

## 2022-05-21 NOTE — Assessment & Plan Note (Addendum)
History of pulmonary embolism and DVT  Continue Eliquis

## 2022-05-21 NOTE — ED Notes (Signed)
Nurse put eliquis in med cup, Nurse gave the pt the med cup, Nurse watched patient put med cup to her mouth but patient reports she did not swallow the medication and doesn't no where medication went. Nurse looked in cup, cup was empty. Nurse looked on bed and sheets did not recover the medication. Nurse believes pt swallowed the eliquis but cannot confirm.

## 2022-05-21 NOTE — H&P (Signed)
History and Physical    Patient: Denise Kelly [id number] DOB: [birth year] DOA: 05/20/2022 DOS: the patient was seen and examined on 05/21/2022 PCP: [name], MD  Patient coming from: Home  Chief Complaint:  Chief Complaint  Patient presents with   Leg Pain   HPI: Denise Kelly is a 82 y.o. female with medical history significant of BLE lymphedema, dCHF.  Pt battling with recurrent cellulitis admissions this past month.  Most recently pt admitted for cellulitis early in Feb.  Followed by just lymphedema later in feb.  Sent to SNF.  Pt discharged home from SNF x2 days ago.  Pt thinks she "wasn't ready" to go home from SNF.  Now willing to go back to SNF.  She reports severe BLE pain.  Has similar pain with cellulitis in past.  One leg not bothering her more than another.  Unknown if any fevers.  Symptoms onset 2 days ago and progressively worsening.   Review of Systems: As mentioned in the history of present illness. All other systems reviewed and are negative. Past Medical History:  Diagnosis Date   Alcohol abuse    ETOH and xanax   Anxiety    Panic attacks    Arthritis    Cancer ([place])    skin- basal , squamous , melonoma- right hand   Cellulitis    right leg   Complication of anesthesia 2009   "felt drunk for a week after"- AVM- both times- felt drunk   Depression    Encephalopathy    GERD (gastroesophageal reflux disease)    HOH (hard of hearing)    HOH (hard of hearing)    Hypertension    not on mediacations   Palpitations    PONV (postoperative nausea and vomiting)    Spondylolisthesis of lumbosacral region    UTI (urinary tract infection)    frequently   Past Surgical History:  Procedure Laterality Date   BIOPSY  01/24/2018   Procedure: BIOPSY;  Surgeon: Ronnette Juniper, MD;  Location: [facility] ENDOSCOPY;  Service: Gastroenterology;;   BREAST SURGERY Right    2 breast biopsies   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   COLONOSCOPY  2011   polyps    ESOPHAGOGASTRODUODENOSCOPY (EGD) WITH PROPOFOL N/A 01/24/2018   Procedure: ESOPHAGOGASTRODUODENOSCOPY (EGD) WITH PROPOFOL;  Surgeon: Ronnette Juniper, MD;  Location: [place];  Service: Gastroenterology;  Laterality: N/A;   EYE SURGERY Bilateral    cataracts   INTRAMEDULLARY (IM) NAIL INTERTROCHANTERIC Left 11/29/2015   Procedure: LEFT HIP   NAIL;  Surgeon: Renette Butters, MD;  Location: [place];  Service: Orthopedics;  Laterality: Left;   JOINT REPLACEMENT Left 09/2009   knee   LUMBAR FUSION  07/17/2016   Lumbar four-five Posterior lumbar interbody fusion (N/A)   TONSILLECTOMY     TOTAL KNEE ARTHROPLASTY Right 01/02/2014   Procedure: TOTAL KNEE ARTHROPLASTY;  Surgeon: [name], MD;  Location: [place];  Service: Orthopedics;  Laterality: Right;   TUBAL LIGATION     Social History:  reports that she quit smoking about 30 years ago. She has a 20.00 pack-year smoking history. She has never used smokeless tobacco. She reports that she does not drink alcohol and does not use drugs.  Allergies  Allergen Reactions   Sulfa Antibiotics Hives, Swelling and Other (See Comments)    Facial/eye swelling    Coreg [Carvedilol] Other (See Comments)    Memory loss   Motrin [Ibuprofen] Palpitations   Toprol Xl [Metoprolol  Tartrate] Cough   Doxycycline Hyclate Other (See Comments)    HEARTBURN   Flovent Hfa [Fluticasone] Itching   Statins Other (See Comments)    MENTAL STATUS CHANGE    Family History  Problem Relation Age of Onset   Hypertension Other    Cancer Mother    Cancer Father     Prior to Admission medications   Medication Sig Start Date End Date Taking? Authorizing Provider  acetaminophen (TYLENOL) 500 MG tablet Take 500 mg by mouth every 6 (six) hours as needed for moderate pain.    [provider]  apixaban (ELIQUIS) 5 MG TABS tablet Take 5 mg by mouth 2 (two) times daily.    [provider]  cyanocobalamin 1000 MCG tablet Take 1 tablet (1,000 mcg total) by mouth  daily. 05/03/22   Florencia Reasons, MD  folic acid (FOLVITE) 1 MG tablet Take 1 tablet (1 mg total) by mouth daily. 04/23/22 08/01/22  Pokhrel, Corrie Mckusick, MD  furosemide (LASIX) 40 MG tablet Take 1 tablet (40 mg total) by mouth daily. 04/23/22 07/22/22  Pokhrel, Corrie Mckusick, MD  gabapentin (NEURONTIN) 100 MG capsule Take 1 capsule (100 mg total) by mouth 3 (three) times daily. 05/02/22   Florencia Reasons, MD  guaiFENesin (MUCINEX) 600 MG 12 hr tablet Take 1 tablet (600 mg total) by mouth 2 (two) times daily. 05/02/22 05/02/23  Florencia Reasons, MD  lidocaine (LIDODERM) 5 % Place 1 patch onto the skin daily. Remove & Discard patch within 12 hours or as directed by MD 05/02/22   Florencia Reasons, MD  Magnesium Oxide 400 MG CAPS Take 1 capsule (400 mg total) by mouth every Monday, Wednesday, and Friday. 05/02/22   Florencia Reasons, MD  melatonin 3 MG TABS tablet Take 1 tablet (3 mg total) by mouth at bedtime. 05/02/22   Florencia Reasons, MD  Multiple Vitamin (MULTIVITAMIN WITH MINERALS) TABS tablet Take 1 tablet by mouth daily. 04/23/22   Pokhrel, Corrie Mckusick, MD  ondansetron (ZOFRAN) 4 MG tablet Take 4 mg by mouth every 8 (eight) hours as needed for nausea or vomiting.    [provider]  pantoprazole (PROTONIX) 40 MG tablet Take 1 tablet (40 mg total) by mouth daily. 05/03/22   Florencia Reasons, MD  polyethylene glycol (MIRALAX / GLYCOLAX) 17 g packet Take 17 g by mouth daily. Hold if diarrhea 05/03/22   Florencia Reasons, MD  potassium chloride SA (KLOR-CON M) 20 MEQ tablet Take 1 tablet (20 mEq total) by mouth daily. 05/02/22   Florencia Reasons, MD  pramipexole (MIRAPEX) 0.125 MG tablet Take 0.125 mg by mouth daily. 04/15/22   [provider]  senna-docusate (SENOKOT-S) 8.6-50 MG tablet Take 1 tablet by mouth at bedtime. Hold if diarrhea 05/02/22   Florencia Reasons, MD  thiamine (VITAMIN B1) 100 MG tablet Take 1 tablet (100 mg total) by mouth daily. 04/23/22 08/01/22  Pokhrel, Corrie Mckusick, MD  traZODone (DESYREL) 100 MG tablet Take 1 tablet (100 mg total) by mouth at bedtime as needed for sleep.  05/02/22   Florencia Reasons, MD    Physical Exam: Vitals:   05/20/22 2242 05/20/22 2255 05/21/22 0130 05/21/22 0135  BP: 127/77  108/60   Pulse: (!) 101  94   Resp: (!) 21  18   Temp:    98.2 F (36.8 C)  TempSrc:      SpO2: 95%  92%   Weight:  90.7 kg    Height: '5\' 7"'$  (1.702 m) '5\' 7"'$  (1.702 m)     Constitutional: On phone,  no acute distress but says she has severe pain. Respiratory: clear to auscultation bilaterally, no wheezing, no crackles. Normal respiratory effort. No accessory muscle use.  Cardiovascular: Regular rate and rhythm, no murmurs / rubs / gallops. No extremity edema. 2+ pedal pulses. No carotid bruits.  Abdomen: no tenderness, no masses palpated. No hepatosplenomegaly. Bowel sounds positive.  Skin: Pt refuses to let anyone touch her legs for exam.  Visual exam as below does look more erythematous than photo from last admission.  This time looks closer to early feb admit in terms of erythema:  Neurologic: CN 2-12 grossly intact. Sensation intact, DTR normal. Strength 5/5 in all 4.  Psychiatric: Normal judgment and insight. Alert and oriented x 3. Normal mood.   Data Reviewed:       Latest Ref Rng & Units 05/20/2022   10:41 PM 05/01/2022    5:50 AM 04/30/2022    5:59 AM  CBC  WBC 4.0 - 10.5 K/uL 4.2  6.1  5.5   Hemoglobin 12.0 - 15.0 g/dL 14.3  12.9  12.7   Hematocrit 36.0 - 46.0 % 43.0  40.0  39.7   Platelets 150 - 400 K/uL 149  160  158       Latest Ref Rng & Units 05/20/2022   10:41 PM 05/02/2022    6:14 AM 05/01/2022    5:50 AM  CMP  Glucose 70 - 99 mg/dL 100  112  123   BUN 8 - 23 mg/dL '11  17  17   '$ Creatinine 0.44 - 1.00 mg/dL 0.76  0.87  0.80   Sodium 135 - 145 mmol/L 137  140  137   Potassium 3.5 - 5.1 mmol/L 3.2  3.5  3.3   Chloride 98 - 111 mmol/L 100  100  97   CO2 22 - 32 mmol/L '29  31  30   '$ Calcium 8.9 - 10.3 mg/dL 8.5  8.5  8.3   Total Protein 6.5 - 8.1 g/dL 7.1     Total Bilirubin 0.3 - 1.2 mg/dL 0.7     Alkaline Phos 38 - 126 U/L 95     AST 15 - 41  U/L 32     ALT 0 - 44 U/L 19      Lactates of 1.3 -> 0.7  BNP 12.2  Assessment and Plan: * Bilateral cellulitis of lower leg BLE cellulitis, recurrent in setting of lymphedema and some small areas of open wounds. 1/4 SIRS only (max HR in ED was 101). Lactates of 1.3 and 0.7 Pt refused exam of legs so can't speak to presence or absence of crepitus Offered to CT legs to r/o subq air, but patient refused CT scan Severe pain of both legs, but no other worrisome findings at this time.  IE, certainly does not have sepsis or systemic symptoms I would expect with a more severe / deeper infection than cellulitis. Also pt with equal and bilateral pain States she always gets pain like this with cellulitis, and has previously this month. Treating as cellulitis for the moment Cellulitis pathway Rocephin Fentanyl PRN pain Repeat CBC/BMP in AM  Generalized weakness Ongoing, pt feels she got discharged from SNF too early when they sent her home 2 days ago. PT OT SW  Hereditary lymphedema of legs Chronic and long standing  Chronic diastolic CHF (congestive heart failure) (HCC) Not in acute exacerbation Cont home lasix  Personal history of thromboembolic disease Cont eliquis No missed doses per patient DVT felt less likely given above.  Hypokalemia Replace K      Advance Care Planning:   Code Status: Full Code  Consults: None  Family Communication: No family in room  Severity of Illness: The appropriate patient status for this patient is OBSERVATION. Observation status is judged to be reasonable and necessary in order to provide the required intensity of service to ensure the patient's safety. The patient's presenting symptoms, physical exam findings, and initial radiographic and laboratory data in the context of their medical condition is felt to place them at decreased risk for further clinical deterioration. Furthermore, it is anticipated that the patient will be medically  stable for discharge from the hospital within 2 midnights of admission.   Author: Etta Quill., DO 05/21/2022 1:55 AM  For on call review [email or web address].

## 2022-05-21 NOTE — Plan of Care (Signed)
  Problem: Education: Goal: Knowledge of General Education information will improve Description: Including pain rating scale, medication(s)/side effects and non-pharmacologic comfort measures Outcome: Progressing   Problem: Pain Managment: Goal: General experience of comfort will improve Outcome: Progressing   Problem: Safety: Goal: Ability to remain free from injury will improve Outcome: Progressing   

## 2022-05-21 NOTE — Progress Notes (Signed)
PT Cancellation Note  Patient Details Name: LISSIE GLASSON MRN: [id number] DOB: [birth year]   Cancelled Treatment:    Reason Eval/Treat Not Completed: Patient declined, no reason specified (pt adamantly refused to attempt mobility at this time, reason was "I don't want to". Will attempt later this afternoon.)  Philomena Doheny PT 05/21/2022  Acute Rehabilitation Services  Office [phone]

## 2022-05-21 NOTE — ED Notes (Signed)
ED TO INPATIENT HANDOFF REPORT  ED Nurse Name and Phone #: Sylvan Cheese Name/Age/Gender [name] 82 y.o. female Room/Bed: WA25/WA25  Code Status   Code Status: Full Code  Home/SNF/Other Home Patient oriented to: self, place, and situation Is this baseline? Yes   Triage Complete: Triage complete  Chief Complaint Bilateral cellulitis of lower leg [id number], [id number]  Triage Note Pt came via EMS for leg pain. EMS vitals.164/108 BP, 100%,  94 Pulse.    Allergies Allergies  Allergen Reactions   Sulfa Antibiotics Hives, Swelling and Other (See Comments)    Facial/eye swelling    Coreg [Carvedilol] Other (See Comments)    Memory loss   Motrin [Ibuprofen] Palpitations   Toprol Xl [Metoprolol Tartrate] Cough   Doxycycline Hyclate Other (See Comments)    HEARTBURN   Flovent Hfa [Fluticasone] Itching   Statins Other (See Comments)    MENTAL STATUS CHANGE    Level of Care/Admitting Diagnosis ED Disposition     ED Disposition  Admit   Condition  --   [place]: [place] [[id number]]  Level of Care: Med-Surg [16]  May place patient in observation at [facility] or [place] if equivalent level of care is available:: No  Covid Evaluation: Asymptomatic - no recent exposure (last 10 days) testing not required  Diagnosis: Bilateral cellulitis of lower leg [id number]  Admitting Physician: Etta Quill [id number]  Attending Physician: Etta Quill [[id number]]          B Medical/Surgery History Past Medical History:  Diagnosis Date   Alcohol abuse    ETOH and xanax   Anxiety    Panic attacks    Arthritis    Cancer ([place])    skin- basal , squamous , melonoma- right hand   Cellulitis    right leg   Complication of anesthesia 2009   "felt drunk for a week after"- AVM- both times- felt drunk   Depression    Encephalopathy    GERD (gastroesophageal reflux disease)    HOH (hard of hearing)    HOH (hard of hearing)     Hypertension    not on mediacations   Palpitations    PONV (postoperative nausea and vomiting)    Spondylolisthesis of lumbosacral region    UTI (urinary tract infection)    frequently   Past Surgical History:  Procedure Laterality Date   BIOPSY  01/24/2018   Procedure: BIOPSY;  Surgeon: Ronnette Juniper, MD;  Location: [facility] ENDOSCOPY;  Service: Gastroenterology;;   BREAST SURGERY Right    2 breast biopsies   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   COLONOSCOPY  2011   polyps   ESOPHAGOGASTRODUODENOSCOPY (EGD) WITH PROPOFOL N/A 01/24/2018   Procedure: ESOPHAGOGASTRODUODENOSCOPY (EGD) WITH PROPOFOL;  Surgeon: Ronnette Juniper, MD;  Location: [place];  Service: Gastroenterology;  Laterality: N/A;   EYE SURGERY Bilateral    cataracts   INTRAMEDULLARY (IM) NAIL INTERTROCHANTERIC Left 11/29/2015   Procedure: LEFT HIP   NAIL;  Surgeon: Renette Butters, MD;  Location: [place];  Service: Orthopedics;  Laterality: Left;   JOINT REPLACEMENT Left 09/2009   knee   LUMBAR FUSION  07/17/2016   Lumbar four-five Posterior lumbar interbody fusion (N/A)   TONSILLECTOMY     TOTAL KNEE ARTHROPLASTY Right 01/02/2014   Procedure: TOTAL KNEE ARTHROPLASTY;  Surgeon: [name], MD;  Location: [place];  Service: Orthopedics;  Laterality: Right;   TUBAL LIGATION  A IV Location/Drains/Wounds Patient Lines/Drains/Airways Status     Active Line/Drains/Airways     Name Placement date Placement time Site Days   Peripheral IV 05/02/22 20 G 1.16" Anterior;Distal;Right Forearm 05/02/22  0608  Forearm  19   Closed System Drain 1 Posterior Back Accordion (Hemovac) 07/17/16  1400  Back  2134   External Urinary Catheter 04/30/22  1430  --  21   External Urinary Catheter 05/20/22  2259  --  1            Intake/Output Last 24 hours No intake or output data in the 24 hours ending 05/21/22 0156  Labs/Imaging Results for orders placed or performed during the hospital encounter of 05/20/22 (from the  past 48 hour(s))  Lactic acid, plasma     Status: None   Collection Time: 05/20/22 10:41 PM  Result Value Ref Range   Lactic Acid, Venous 1.3 0.5 - 1.9 mmol/L    Comment: Performed at [facility], [place] [address] [place], [place] [address]  Comprehensive metabolic panel     Status: Abnormal   Collection Time: 05/20/22 10:41 PM  Result Value Ref Range   Sodium 137 135 - 145 mmol/L   Potassium 3.2 (L) 3.5 - 5.1 mmol/L   Chloride 100 98 - 111 mmol/L   CO2 29 22 - 32 mmol/L   Glucose, Bld 100 (H) 70 - 99 mg/dL    Comment: Glucose reference range applies only to samples taken after fasting for at least 8 hours.   BUN 11 8 - 23 mg/dL   Creatinine, Ser 0.76 0.44 - 1.00 mg/dL   Calcium 8.5 (L) 8.9 - 10.3 mg/dL   Total Protein 7.1 6.5 - 8.1 g/dL   Albumin 3.6 3.5 - 5.0 g/dL   AST 32 15 - 41 U/L   ALT 19 0 - 44 U/L   Alkaline Phosphatase 95 38 - 126 U/L   Total Bilirubin 0.7 0.3 - 1.2 mg/dL   GFR, Estimated >60 >60 mL/min    Comment: (NOTE) Calculated using the CKD-EPI Creatinine Equation (2021)    Anion gap 8 5 - 15    Comment: Performed at [facility], [place] [address] [place], [place] [address]  CBC with Differential     Status: Abnormal   Collection Time: 05/20/22 10:41 PM  Result Value Ref Range   WBC 4.2 4.0 - 10.5 K/uL   RBC 5.02 3.87 - 5.11 MIL/uL   Hemoglobin 14.3 12.0 - 15.0 g/dL   HCT 43.0 36.0 - 46.0 %   MCV 85.7 80.0 - 100.0 fL   MCH 28.5 26.0 - 34.0 pg   MCHC 33.3 30.0 - 36.0 g/dL   RDW 16.1 (H) 11.5 - 15.5 %   Platelets 149 (L) 150 - 400 K/uL    Comment: SPECIMEN CHECKED FOR CLOTS REPEATED TO VERIFY    nRBC 0.0 0.0 - 0.2 %   Neutrophils Relative % 49 %   Neutro Abs 2.1 1.7 - 7.7 K/uL   Lymphocytes Relative 39 %   Lymphs Abs 1.6 0.7 - 4.0 K/uL   Monocytes Relative 10 %   Monocytes Absolute 0.4 0.1 - 1.0 K/uL   Eosinophils Relative 1 %   Eosinophils Absolute 0.0 0.0 - 0.5 K/uL   Basophils Relative 1 %   Basophils  Absolute 0.0 0.0 - 0.1 K/uL   Immature Granulocytes 0 %   Abs Immature Granulocytes 0.01 0.00 - 0.07 K/uL    Comment: Performed at [facility], [place] [address] [place], [place] [address]  Protime-INR     Status: None   Collection Time: 05/20/22 10:41 PM  Result Value Ref Range   Prothrombin Time 14.5 11.4 - 15.2 seconds   INR 1.1 0.8 - 1.2    Comment: (NOTE) INR goal varies based on device and disease states. Performed at [facility], [place] [address], [place], [place] [address]   APTT     Status: None   Collection Time: 05/20/22 10:41 PM  Result Value Ref Range   aPTT 33 24 - 36 seconds    Comment: Performed at [facility], [place] [address], [place], [place] [address]  Brain natriuretic peptide     Status: None   Collection Time: 05/20/22 10:59 PM  Result Value Ref Range   B Natriuretic Peptide 12.2 0.0 - 100.0 pg/mL    Comment: Performed at [facility], [place] [address], [place], [place] [address]  Urinalysis, w/ Reflex to Culture (Infection Suspected) -Urine, Clean Catch     Status: Abnormal   Collection Time: 05/20/22 11:12 PM  Result Value Ref Range   Specimen Source URINE, CLEAN CATCH    Color, Urine YELLOW YELLOW   APPearance HAZY (A) CLEAR   Specific Gravity, Urine 1.011 1.005 - 1.030   pH 6.0 5.0 - 8.0   Glucose, UA NEGATIVE NEGATIVE mg/dL   Hgb urine dipstick SMALL (A) NEGATIVE   Bilirubin Urine NEGATIVE NEGATIVE   Ketones, ur NEGATIVE NEGATIVE mg/dL   Protein, ur NEGATIVE NEGATIVE mg/dL   Nitrite NEGATIVE NEGATIVE   Leukocytes,Ua NEGATIVE NEGATIVE   RBC / HPF 0-5 0 - 5 RBC/hpf   WBC, UA 0-5 0 - 5 WBC/hpf    Comment:        Reflex urine culture not performed if WBC <=10, OR if Squamous epithelial cells >5. If Squamous epithelial cells >5 suggest recollection.    Bacteria, UA MANY (A) NONE SEEN   Squamous Epithelial / HPF 0-5 0 - 5 /HPF    Comment: Performed at [facility], [place] [address], [place], [place] [address]  Lactic acid, plasma     Status: None   Collection Time: 05/20/22 11:47 PM  Result Value Ref Range   Lactic Acid, Venous 0.7 0.5 - 1.9 mmol/L    Comment: Performed at [facility], [place] [address], [place], [place] [address]   DG Chest Port 1 View  Result Date: 05/20/2022 CLINICAL DATA:  Possible sepsis EXAM: PORTABLE CHEST 1 VIEW COMPARISON:  04/29/2022 FINDINGS: Cardiac shadow is stable. The lungs are well aerated bilaterally. No focal infiltrate or sizable effusion is seen. No bony abnormality is noted. IMPRESSION: No active disease. Electronically Signed   By: Inez Catalina M.D.   On: 05/20/2022 22:18    Pending Labs Unresulted Labs (From admission, onward)     Start     Ordered   05/21/22 0500  CBC  Tomorrow morning,   R        05/21/22 0146   05/21/22 [id number]  Basic metabolic panel  Tomorrow morning,   R        05/21/22 0146   05/21/22 0145  MRSA Next Gen by PCR, Nasal  Once,   R        05/21/22 0146   05/21/22 0044  Urine Culture  Add-on,   AD       Question:  Indication  Answer:  Bacteriuria screening (OB/GYN or Uro)  05/21/22 0043   05/20/22 2159  Blood Culture (routine x 2)  (Undifferentiated presentation (screening labs and basic nursing orders))  BLOOD CULTURE X 2,   STAT      05/20/22 2158            Vitals/Pain Today's Vitals   05/20/22 2255 05/21/22 0023 05/21/22 0130 05/21/22 0135  BP:   108/60   Pulse:   94   Resp:   18   Temp:    98.2 F (36.8 C)  TempSrc:      SpO2:   92%   Weight: 90.7 kg     Height: '5\' 7"'$  (1.702 m)     PainSc:  8       Isolation Precautions No active isolations  Medications Medications  apixaban (ELIQUIS) tablet 5 mg (has no administration in time range)  ondansetron (ZOFRAN) tablet 4 mg (has no administration in time range)    Or  ondansetron (ZOFRAN) injection 4 mg (has no administration in time range)  cefTRIAXone (ROCEPHIN) 1 g in sodium chloride 0.9 %  100 mL IVPB (has no administration in time range)  acetaminophen (TYLENOL) tablet 650 mg (has no administration in time range)    Or  acetaminophen (TYLENOL) suppository 650 mg (has no administration in time range)  fentaNYL (SUBLIMAZE) injection 12.5-50 mcg (has no administration in time range)  morphine (PF) 4 MG/ML injection 4 mg (4 mg Intravenous Given 05/20/22 2246)  cefTRIAXone (ROCEPHIN) 1 g in sodium chloride 0.9 % 100 mL IVPB (0 g Intravenous Stopped 05/21/22 0023)  potassium chloride SA (KLOR-CON M) CR tablet 40 mEq (40 mEq Oral Given 05/20/22 2334)  fentaNYL (SUBLIMAZE) injection 50 mcg (50 mcg Intravenous Given 05/21/22 0052)    Mobility Pt states she walks at home but currently refuses due to pain.      Focused Assessments Cardiac Assessment Handoff:    Lab Results  Component Value Date   CKTOTAL 61 04/30/2022   TROPONINI 0.03 (HH) 06/14/2016   Lab Results  Component Value Date   DDIMER 0.86 (H) 01/04/2018   Does the Patient currently have chest pain? No    R Recommendations: See Admitting Provider Note  Report given to:   Additional Notes: Pt does not ambulate, pt is AAOx3, anxious and some confusion.

## 2022-05-21 NOTE — Assessment & Plan Note (Addendum)
Patient continues to not exhibit any fever  Procalcitonin unremarkable.  CRP unremarkable.  White blood cell count unremarkable.   Per my review of the patient's chart, it seems that patient has had some degree of bilateral lower extremity pain and redness since at least 2018 when she was evaluated by Dr. Donzetta Matters in vascular clinic I therefore think it is unlikely that the patient is suffering from an acute bout of cellulitis and will discontinue antibiotics at this time Patient is likely suffering from pain secondary to her longstanding lymphedema.  Severe chronic pain is likely secondary to discomfort related to the degree of edema with perhaps superimposed neuropathy Pain seems to be proving on combination of scheduled Tylenol twice daily, duloxetine 30 mg daily and gabapentin 100 mg 3 times daily with Lasix twice daily Blood cultures remain negative

## 2022-05-21 NOTE — Plan of Care (Signed)
  Problem: Activity: Goal: Risk for activity intolerance will decrease Outcome: Progressing   Problem: Pain Managment: Goal: General experience of comfort will improve Outcome: Progressing   Problem: Safety: Goal: Ability to remain free from injury will improve Outcome: Progressing   

## 2022-05-21 NOTE — Assessment & Plan Note (Addendum)
Recurrent Replacing with potassium chloride Evaluating for concurrent hypomagnesemia  Monitoring potassium levels with serial chemistries.

## 2022-05-21 NOTE — ED Notes (Signed)
Nurse at bedside while provider Ovid Curd used sonogram to identify bilaterial DP and PT.

## 2022-05-21 NOTE — Progress Notes (Addendum)
SAME DAY PROGRESS NOTE   Denise Kelly  [id number] DOB: 1940-04-26 DOA: 05/20/2022 PCP: [name], MD   Date of Service: the patient was seen and examined on 05/21/2022  Brief Narrative:  82 y.o. female with medical history significant for diastolic congestive heart failure (echo 12/2017 EF 60-65% with G1DD),  lymphedema, chronic pain syndrome, restless leg syndrome, GERD, hypertension, history of pulmonary embolism and DVT on Eliquis, who presented to [facility] ED from home via EMS after being found lying in her own urine, complaining of severe bilateral lower extremity pain.    Of note, patient was recently admitted to [facility] from 2/4 until 2/7 for cellulitis of the bilateral lower extremities.  Patient was also found to be markedly weak during that hospitalization.  Patient was felt to benefit from skilled physical therapy services in a skilled nursing facility but declined and instead was discharged home on 2/7.  Patient then presented again to [facility] on 2/12 with worsening pain and weakness.  Due to persisting severe weakness, patient was initially discharged to skilled nursing facility on 2/16.   Patient now presents to [facility] emergency department again with severe bilateral lower extremity pain 2 days after being discharged from [facility] skilled nursing facility.  Upon evaluation in the emergency department patient was clinically thought to be suffering from recurrent bilateral lower extremity cellulitis and concurrent weakness.  The hospitalist group was then called to assess the patient for admission the hospital.   Assessment and Plan: * Bilateral cellulitis of lower leg Difficult to ascertain how much of patient's physical exam findings are chronic and how much are acute  For now, treating for suspected cellulitis with intravenous ceftriaxone  Treating concurrent severe bilateral lower extremity pain with as needed opiate-based  analgesics Blood cultures remain negative Obtaining procalcitonin and CRP  Generalized weakness Continued severe weakness, particularly of the bilateral lower extremities PT, OT evaluation today to assess whether patient would benefit from continued skilled physical therapy  Hereditary lymphedema of legs Longstanding lymphedema of the legs is likely what predisposes patients to recurrent infections and chronic pain    Chronic diastolic CHF (congestive heart failure) (HCC) No clinical evidence of cardiogenic volume overload Strict input and output monitoring Daily weights Low-sodium diet   Essential hypertension Documented history of hypertension Normotensive As needed intravenous antihypertensives for markedly elevated blood pressure  Personal history of thromboembolic disease History of pulmonary embolism and DVT  Continue Eliquis   Hypokalemia Persisting Replacing with potassium chloride Evaluating for concurrent hypomagnesemia  Monitoring potassium levels with serial chemistries.   GERD without esophagitis Continuing home regimen of daily PPI therapy.         Subjective:  Patient continues to complain of severe bilateral lower extremity pain.  Pain is sharp in quality, severe in intensity, worse with weightbearing and radiating proximally.  Physical Exam:  Vitals:   05/21/22 0319 05/21/22 0543 05/21/22 0810 05/21/22 1254  BP: (!) 107/91 114/65 118/62 110/63  Pulse: (!) 101 96 93 90  Resp: '18 16 18 18  '$ Temp: 98.3 F (36.8 C) 98.1 F (36.7 C) 97.6 F (36.4 C) 98.6 F (37 C)  TempSrc: Oral  Oral   SpO2: 93% 93% 91% 93%  Weight:      Height:        Constitutional: Awake alert and oriented x3, no associated distress.   Skin: Marked erythema of the bilateral lower extremities that tracks from the ankles up to the knees with numerous areas  of excoriations.  Associated warmth noted.  No evidence of drainage or foul smell. Eyes: Pupils are equally  reactive to light.  No evidence of scleral icterus or conjunctival pallor.  ENMT: Moist mucous membranes noted.  Posterior pharynx clear of any exudate or lesions.   Respiratory: clear to auscultation bilaterally, no wheezing, no crackles. Normal respiratory effort. No accessory muscle use.  Cardiovascular: Regular rate and rhythm, no murmurs / rubs / gallops. No extremity edema. 2+ pedal pulses. No carotid bruits.  Abdomen: Abdomen is soft and nontender.  No evidence of intra-abdominal masses.  Positive bowel sounds noted in all quadrants.   Musculoskeletal: No joint deformity upper and lower extremities. Good ROM, no contractures. Normal muscle tone.    Data Reviewed:  I have personally reviewed and interpreted labs, imaging.  Significant findings are   CBC: Recent Labs  Lab 05/20/22 2241 05/21/22 0334  WBC 4.2 5.0  NEUTROABS 2.1  --   HGB 14.3 13.3  HCT 43.0 40.6  MCV 85.7 86.4  PLT 149* 0000000*   Basic Metabolic Panel: Recent Labs  Lab 05/20/22 2241 05/21/22 0334  NA 137 138  K 3.2* 3.3*  CL 100 103  CO2 29 28  GLUCOSE 100* 100*  BUN 11 10  CREATININE 0.76 0.72  CALCIUM 8.5* 8.2*   GFR: Estimated Creatinine Clearance: 64.8 mL/min (by C-G formula based on SCr of 0.72 mg/dL). Liver Function Tests: Recent Labs  Lab 05/20/22 2241  AST 32  ALT 19  ALKPHOS 95  BILITOT 0.7  PROT 7.1  ALBUMIN 3.6    Coagulation Profile: Recent Labs  Lab 05/20/22 2241  INR 1.1       Code Status:  Full code.  Code status decision has been confirmed with: patient    Severity of Illness:  The appropriate patient status for this patient is OBSERVATION. Observation status is judged to be reasonable and necessary in order to provide the required intensity of service to ensure the patient's safety. The patient's presenting symptoms, physical exam findings, and initial radiographic and laboratory data in the context of their medical condition is felt to place them at decreased  risk for further clinical deterioration. Furthermore, it is anticipated that the patient will be medically stable for discharge from the hospital within 2 midnights of admission.    Author:  Vernelle Emerald MD  05/21/2022 9:25 PM

## 2022-05-21 NOTE — TOC Initial Note (Signed)
Transition of Care [facility]) - Initial/Assessment Note    Patient Details  Name: Denise Kelly MRN: [id number] Date of Birth: 1940-12-09  [facility] CM/SW Contact:    Lennart Pall, LCSW Phone Number: 05/21/2022, 11:49 AM  Clinical Narrative:                 Met with pt today to review dc planning needs.  Pt sitting up in bed and appears a little guarded with questions.  She does confirm that she lives alone and had just been at [facility] for ~ 2 weeks prior to her dc home this past Sunday.  Explained to her that MD has placed TOC order to assist with SNF again and she is agreeable but does not want to return to [facility]. Explained to her that CSW will assist with SNF again, however, need to determine how many days she has used as she is likely close to being in co-pay days.  Also, need her to work with therapies today to determine that this level is recommended.  Asked pt about any support persons she may have and she provides contact info for her nephew, [employer] [facility] [phone]) and agrees to CSW adding to her chart contacts.  TOC will begin work up for SNF as soon as we have PT eval/ documentation.  Expected Discharge Plan: Skilled Nursing Facility Barriers to Discharge: Insurance Authorization, SNF Pending bed offer   Patient Goals and CMS Choice Patient states their goals for this hospitalization and ongoing recovery are:: pt hopeful to dc to [place] CMS Medicare.gov Compare Post Acute Care list provided to:: Patient Choice offered to / list presented to : Patient [place] ownership interest in [facility].provided to:: Patient    Expected Discharge Plan and Services In-house Referral: Clinical Social Work   Post Acute Care Choice: [place] Living arrangements for the past 2 months: Apartment                                      Prior Living Arrangements/Services Living arrangements for the past 2 months:  Apartment Lives with:: Self, Pets Patient language and need for interpreter reviewed:: Yes Do you feel safe going back to the place where you live?: Yes      Need for Family Participation in Patient Care: Yes (Comment) Care giver support system in place?: No (comment)   Criminal Activity/Legal Involvement Pertinent to Current Situation/Hospitalization: No - Comment as needed  Activities of Daily Living Home Assistive Devices/Equipment: Walker (specify type) (two front wheel walker) ADL Screening (condition at time of admission) Patient's cognitive ability adequate to safely complete daily activities?: Yes Is the patient deaf or have difficulty hearing?: Yes Does the patient have difficulty seeing, even when wearing glasses/contacts?: No Does the patient have difficulty concentrating, remembering, or making decisions?: Yes Patient able to express need for assistance with ADLs?: Yes Does the patient have difficulty dressing or bathing?: Yes Independently performs ADLs?: Yes (appropriate for developmental age) Communication: Independent Dressing (OT): Needs assistance Is this a change from baseline?: Pre-admission baseline Grooming: Independent Feeding: Independent Bathing: Needs assistance Is this a change from baseline?: Pre-admission baseline Toileting: Needs assistance Is this a change from baseline?: Change from baseline, expected to last >3days In/Out Bed: Needs assistance Is this a change from baseline?: Change from baseline, expected to last >3 days Walks in Home: Needs assistance Is  this a change from baseline?: Change from baseline, expected to last >3 days Does the patient have difficulty walking or climbing stairs?: Yes Weakness of Legs: Both Weakness of Arms/Hands: None  Permission Sought/Granted Permission sought to share information with : Facility Art therapist granted to share information with : Yes, Verbal Permission Granted     Permission  granted to share info w AGENCY: SNFs        Emotional Assessment Appearance:: Appears stated age Attitude/Demeanor/Rapport: Guarded Affect (typically observed): Guarded Orientation: : Oriented to Self, Oriented to Place, Oriented to  Time, Oriented to Situation Alcohol / Substance Use: Not Applicable Psych Involvement: No (comment)  Admission diagnosis:  Cellulitis [L03.90] Bilateral cellulitis of lower leg [L03.116, L03.115] Cellulitis of lower extremity, unspecified laterality [L03.119] Patient Active Problem List   Diagnosis Date Noted   Bilateral cellulitis of lower leg 05/21/2022   FTT (failure to thrive) in adult 05/01/2022   Chronic diastolic CHF (congestive heart failure) ([place]) 04/30/2022   GERD without esophagitis 04/30/2022   Right hip pain 04/30/2022   Hereditary lymphedema of legs 04/30/2022   Generalized weakness 04/28/2022   Cellulitis 04/20/2022   Lymphedema 04/20/2022   Personal history of thromboembolic disease [id number]   History of pulmonary embolus (PE) 04/20/2022   UTI (urinary tract infection) 0000000   Acute metabolic encephalopathy 0000000   Upper GI bleed 01/23/2018   Leukocytosis 01/23/2018   Physical deconditioning 01/23/2018   Acute pulmonary embolus ([place]) 01/04/2018   Acute encephalopathy 09/28/2017   Constipation, slow transit 09/28/2017   Unsteady gait 04/28/2017   Spondylolisthesis 07/17/2016   Bilateral lower extremity edema 07/17/2016   Chronic pain syndrome    Depression 05/23/2016   Chronic back pain 02/25/2016   Lesion of liver 02/25/2016   History of fracture of left hip 11/28/2015   Fall 11/28/2015   RLS (restless legs syndrome) 11/28/2015   Hypokalemia 03/14/2014   Confusion 03/14/2014   Recurrent UTI 03/14/2014   Effusion of right knee 02/22/2014   Alcohol dependence ([place]) 02/22/2014   Substance induced mood disorder ([place]) 02/22/2014   Hepatic encephalopathy ([place])    Altered mental status 02/21/2014   SIRS (systemic  inflammatory response syndrome) ([place]) 02/21/2014   S/P total knee arthroplasty 01/02/2014   Essential hypertension 11/10/2013   LAFB (left anterior fascicular block) 11/10/2013   Obesity, unspecified 11/10/2013   PCP:  [name], MD Pharmacy:   [id number]: [phone]: [phone]    Social Determinants of Health (SDOH) Social History: SDOH Screenings   Food Insecurity: Unknown (05/21/2022)  Recent Concern: Food Insecurity - Food Insecurity Present (04/29/2022)  Housing: Low Risk  (04/29/2022)  Transportation Needs: Unmet Transportation Needs (04/29/2022)  Utilities: Not At Risk (04/29/2022)  Depression (PHQ2-9): Low Risk  (01/09/2019)  Tobacco Use: Medium Risk (05/20/2022)   SDOH Interventions:     Readmission Risk Interventions    05/02/2022   11:19 AM  Readmission Risk Prevention Plan  Transportation Screening Complete  Medication Review (RN Care Manager) Complete  PCP or Specialist appointment within 3-5 days of discharge Complete  HRI or Home Care Consult Complete  SW Recovery Care/Counseling Consult Complete  Palliative Care Screening Not Applicable  Skilled Nursing Facility Complete

## 2022-05-21 NOTE — Progress Notes (Signed)
Pt with severe pain on floor at this time. Seen and evaluated.  No change in appearance of legs. Once again, I offered to order / recommended CT scan of legs to evaluate for deeper infection.  Although pt has no SIRS at this point, the pain which seems out of proportion to exam findings is concerning to me as I explained to patient.  Once again, patient adamantly refused CT scan.  I did try to explain to patient that there was a chance given severity of pain we could miss something serious and explained that I was just concerned for her because of severity of her pain.  Patient thanked me for my concern but has adamantly refused.  RN is actively giving patient IV fentanyl at the time of my evaluation to try and control pain.

## 2022-05-21 NOTE — Assessment & Plan Note (Addendum)
Longstanding lymphedema of the legs is likely what predisposes patients to recurrent infections and chronic pain

## 2022-05-21 NOTE — Hospital Course (Addendum)
82 y.o. female with medical history significant for diastolic congestive heart failure (echo 12/2017 EF 60-65% with G1DD),  lymphedema, chronic pain syndrome, restless leg syndrome, GERD, hypertension, history of pulmonary embolism and DVT on Eliquis, who presented to [facility] ED from home via EMS after being found lying in her own urine, complaining of severe bilateral lower extremity pain.    Of note, patient was recently admitted to [facility] from 2/4 until 2/7 for cellulitis of the bilateral lower extremities.  Patient was also found to be markedly weak during that hospitalization.  Patient was felt to benefit from skilled physical therapy services in a skilled nursing facility but declined and instead was discharged home on 2/7.  Patient then presented again to [facility] on 2/12 with worsening pain and weakness.  Due to persisting severe weakness, patient was initially discharged to skilled nursing facility on 2/16.   Patient presents to [facility] emergency department again with severe bilateral lower extremity pain 2 days after being discharged from [place] skilled nursing facility.  Upon evaluation in the emergency department patient was clinically thought to be suffering from recurrent bilateral lower extremity cellulitis and concurrent weakness.  The hospitalist group was then called to assess the patient for admission the hospital.  Upon a thorough evaluation after hospitalization including obtaining inflammatory markers, procalcitonin and serial evaluations it was felt that the patient was indeed not suffering from cellulitis.  Her antibiotic regimen was therefore discontinued.  It was felt that the patient was suffering from chronic bilateral lower extremity pain due to longstanding bilateral lower extremity lymphedema.  Upon thorough chart review it seems the patient has been suffering from chronic pain and the sequela of her lymphedema since at least  2019.  Patient therefore was placed on a regimen of 650 mg of Tylenol twice daily, gabapentin 100 mg 3 times daily (was noncompliant prior), Cymbalta 30 mg daily and Lasix 40 mg by mouth twice daily.  After this regimen was initiated patient reported a substantial improvement in her pain.  Patient was able to participate in assessments with physical therapy and therefore they recommended home health physical therapy going forward.  Arranges were made for the patient to be discharged home in improved and stable condition with home health services including home health physical therapy, nursing and an aide.  Patient was instructed to follow-up closely with her primary care provider who will likely obtain a repeat chemistry considering patient's recently increased regimen of Lasix.

## 2022-05-21 NOTE — Assessment & Plan Note (Addendum)
Longstanding severe symptoms of the bilateral lower extremities is leading to progressive debility and inability to ambulate due to deconditioning PT and OT evaluation recommending skilled physical therapy services.  Patient evaluating various options including multiple skilled nursing facilities and potential of home health physical therapy

## 2022-05-21 NOTE — Evaluation (Signed)
Physical Therapy Evaluation Patient Details Name: Denise Kelly MRN: [id number] DOB: 12-Oct-1940 Today's Date: 05/21/2022  History of Present Illness  Patient is a 82 year old female who presented with pain a in bilateral lower extremities. Dx of recurrent cellulitis.  Of note patient was admitted from 2/4 to 2/7 with UTI and cellulitis with patient transitioning home after declining to go SNF, then admitted again 2/2-2/16 with BLE cellulitis, DC to SNF. PMH:  chronic pain syndrome, medical noncompliance, R TKA, L TKA, lumbar fusion.  Clinical Impression  Pt admitted with above diagnosis. Noted this is pt's third admission in the past 1 month. She stated she was ambulating short distances with a RW prior to this admission. Today she was able to stand and pivot to a recliner with min assist, she was incontinent of urine upon standing.  Pt currently with functional limitations due to the deficits listed below (see PT Problem List). Pt will benefit from skilled PT to increase their independence and safety with mobility to allow discharge to the venue listed below.          Recommendations for follow up therapy are one component of a multi-disciplinary discharge planning process, led by the attending physician.  Recommendations may be updated based on patient status, additional functional criteria and insurance authorization.  Follow Up Recommendations Skilled nursing-short term rehab (<3 hours/day) Can patient physically be transported by private vehicle: No    Assistance Recommended at Discharge    Patient can return home with the following  A little help with walking and/or transfers;A little help with bathing/dressing/bathroom;Assistance with cooking/housework;Assist for transportation;Help with stairs or ramp for entrance    Equipment Recommendations None recommended by PT  Recommendations for Other Services       Functional Status Assessment Patient has had a recent decline in their  functional status and demonstrates the ability to make significant improvements in function in a reasonable and predictable amount of time.     Precautions / Restrictions Precautions Precautions: Fall Precaution Comments: incontinent of urine Restrictions Weight Bearing Restrictions: No      Mobility  Bed Mobility Overal bed mobility: Needs Assistance Bed Mobility: Supine to Sit     Supine to sit: Min assist, HOB elevated     General bed mobility comments: min assist to bring bil LE's off EOB and cues to use bed rail to initiate trunk raise with assist to fully raise trunk.    Transfers Overall transfer level: Needs assistance Equipment used: Rolling walker (2 wheels) Transfers: Sit to/from Stand, Bed to chair/wheelchair/BSC Sit to Stand: Min guard   Step pivot transfers: Min guard       General transfer comment: VCs hand placement, pt incontinent of urine upon standing but refused transfer to bedside commode ("I hate those things."), reports she uses briefs    Ambulation/Gait                  Stairs            Wheelchair Mobility    Modified Rankin (Stroke Patients Only)       Balance Overall balance assessment: Needs assistance Sitting-balance support: No upper extremity supported, Feet supported Sitting balance-Leahy Scale: Fair     Standing balance support: Reliant on assistive device for balance, During functional activity Standing balance-Leahy Scale: Poor                               Pertinent  Vitals/Pain Pain Assessment Faces Pain Scale: Hurts even more Pain Location: BLEs Pain Descriptors / Indicators: Sore Pain Intervention(s): Limited activity within patient's tolerance, Monitored during session, Premedicated before session, Repositioned    Home Living Family/patient expects to be discharged to:: Skilled nursing facility Living Arrangements: Alone Available Help at Discharge: Neighbor;Available  PRN/intermittently Type of Home: Apartment Home Access: Stairs to enter   Entrance Stairs-Number of Steps: 1   Home Layout: One level Home Equipment: Conservation officer, nature (2 wheels)      Prior Function Prior Level of Function : Needs assist       Physical Assist : Mobility (physical);ADLs (physical)   ADLs (physical): Bathing;Dressing;IADLs Mobility Comments: uses walker, reports being very fearful of falling ADLs Comments: Patient reports being independent. patient had help from daughter in past for ADLs and IADLs. noted that daughter has passed since recent admission     Hand Dominance   Dominant Hand: Right    Extremity/Trunk Assessment   Upper Extremity Assessment Upper Extremity Assessment: Defer to OT evaluation    Lower Extremity Assessment Lower Extremity Assessment: Generalized weakness    Cervical / Trunk Assessment Cervical / Trunk Assessment: Kyphotic  Communication   Communication: HOH  Cognition Arousal/Alertness: Awake/alert Behavior During Therapy: WFL for tasks assessed/performed Overall Cognitive Status: Within Functional Limits for tasks assessed                                          General Comments      Exercises     Assessment/Plan    PT Assessment Patient needs continued PT services  PT Problem List Decreased strength;Decreased range of motion;Decreased activity tolerance;Decreased balance;Decreased mobility       PT Treatment Interventions DME instruction;Therapeutic exercise;Gait training;Functional mobility training;Therapeutic activities;Patient/family education    PT Goals (Current goals can be found in the Care Plan section)  Acute Rehab PT Goals Patient Stated Goal: pt wants to go to a different SNF than the one she was most recently at PT Goal Formulation: With patient Time For Goal Achievement: 06/04/22 Potential to Achieve Goals: Good    Frequency Min 2X/week     Co-evaluation                AM-PAC PT "6 Clicks" Mobility  Outcome Measure Help needed turning from your back to your side while in a flat bed without using bedrails?: A Little Help needed moving from lying on your back to sitting on the side of a flat bed without using bedrails?: A Lot Help needed moving to and from a bed to a chair (including a wheelchair)?: A Little Help needed standing up from a chair using your arms (e.g., wheelchair or bedside chair)?: A Little Help needed to walk in hospital room?: A Lot Help needed climbing 3-5 steps with a railing? : A Lot 6 Click Score: 15    End of Session Equipment Utilized During Treatment: Gait belt Activity Tolerance: Patient limited by fatigue;Patient limited by pain Patient left: with call bell/phone within reach;with chair alarm set;in chair Nurse Communication: Mobility status PT Visit Diagnosis: Other abnormalities of gait and mobility (R26.89);Difficulty in walking, not elsewhere classified (R26.2);Muscle weakness (generalized) (M62.81)    Time: [id number] PT Time Calculation (min) (ACUTE ONLY): 16 min   Charges:   PT Evaluation $PT Eval Moderate Complexity: 1 Mod          Amrutha Avera, Glade Stanford PT  05/21/2022  Acute Rehabilitation Services  Office [phone]

## 2022-05-22 DIAGNOSIS — K219 Gastro-esophageal reflux disease without esophagitis: Secondary | ICD-10-CM

## 2022-05-22 DIAGNOSIS — Z79899 Other long term (current) drug therapy: Secondary | ICD-10-CM | POA: Diagnosis not present

## 2022-05-22 DIAGNOSIS — I110 Hypertensive heart disease with heart failure: Secondary | ICD-10-CM | POA: Diagnosis present

## 2022-05-22 DIAGNOSIS — G894 Chronic pain syndrome: Secondary | ICD-10-CM | POA: Diagnosis present

## 2022-05-22 DIAGNOSIS — Z8249 Family history of ischemic heart disease and other diseases of the circulatory system: Secondary | ICD-10-CM | POA: Diagnosis not present

## 2022-05-22 DIAGNOSIS — R739 Hyperglycemia, unspecified: Secondary | ICD-10-CM | POA: Diagnosis present

## 2022-05-22 DIAGNOSIS — R531 Weakness: Secondary | ICD-10-CM | POA: Diagnosis not present

## 2022-05-22 DIAGNOSIS — I10 Essential (primary) hypertension: Secondary | ICD-10-CM

## 2022-05-22 DIAGNOSIS — Z87891 Personal history of nicotine dependence: Secondary | ICD-10-CM | POA: Diagnosis not present

## 2022-05-22 DIAGNOSIS — I4510 Unspecified right bundle-branch block: Secondary | ICD-10-CM | POA: Diagnosis present

## 2022-05-22 DIAGNOSIS — R5381 Other malaise: Secondary | ICD-10-CM | POA: Diagnosis present

## 2022-05-22 DIAGNOSIS — Z86711 Personal history of pulmonary embolism: Secondary | ICD-10-CM | POA: Diagnosis not present

## 2022-05-22 DIAGNOSIS — I890 Lymphedema, not elsewhere classified: Secondary | ICD-10-CM

## 2022-05-22 DIAGNOSIS — E876 Hypokalemia: Secondary | ICD-10-CM | POA: Diagnosis present

## 2022-05-22 DIAGNOSIS — M79604 Pain in right leg: Secondary | ICD-10-CM

## 2022-05-22 DIAGNOSIS — Z79890 Hormone replacement therapy (postmenopausal): Secondary | ICD-10-CM | POA: Diagnosis not present

## 2022-05-22 DIAGNOSIS — Z86718 Personal history of other venous thrombosis and embolism: Secondary | ICD-10-CM | POA: Diagnosis not present

## 2022-05-22 DIAGNOSIS — Z888 Allergy status to other drugs, medicaments and biological substances status: Secondary | ICD-10-CM | POA: Diagnosis not present

## 2022-05-22 DIAGNOSIS — Q820 Hereditary lymphedema: Secondary | ICD-10-CM | POA: Diagnosis not present

## 2022-05-22 DIAGNOSIS — I5032 Chronic diastolic (congestive) heart failure: Secondary | ICD-10-CM | POA: Diagnosis not present

## 2022-05-22 DIAGNOSIS — D696 Thrombocytopenia, unspecified: Secondary | ICD-10-CM | POA: Diagnosis present

## 2022-05-22 DIAGNOSIS — G2581 Restless legs syndrome: Secondary | ICD-10-CM | POA: Diagnosis present

## 2022-05-22 DIAGNOSIS — Z882 Allergy status to sulfonamides status: Secondary | ICD-10-CM | POA: Diagnosis not present

## 2022-05-22 DIAGNOSIS — M79605 Pain in left leg: Secondary | ICD-10-CM

## 2022-05-22 DIAGNOSIS — Z881 Allergy status to other antibiotic agents status: Secondary | ICD-10-CM | POA: Diagnosis not present

## 2022-05-22 DIAGNOSIS — L0390 Cellulitis, unspecified: Secondary | ICD-10-CM | POA: Diagnosis present

## 2022-05-22 DIAGNOSIS — Z5329 Procedure and treatment not carried out because of patient's decision for other reasons: Secondary | ICD-10-CM | POA: Diagnosis present

## 2022-05-22 DIAGNOSIS — Z981 Arthrodesis status: Secondary | ICD-10-CM | POA: Diagnosis not present

## 2022-05-22 DIAGNOSIS — Z7901 Long term (current) use of anticoagulants: Secondary | ICD-10-CM | POA: Diagnosis not present

## 2022-05-22 LAB — CBC WITH DIFFERENTIAL/PLATELET
Abs Immature Granulocytes: 0.01 10*3/uL (ref 0.00–0.07)
Basophils Absolute: 0 10*3/uL (ref 0.0–0.1)
Basophils Relative: 0 %
Eosinophils Absolute: 0.1 10*3/uL (ref 0.0–0.5)
Eosinophils Relative: 1 %
HCT: 42.5 % (ref 36.0–46.0)
Hemoglobin: 13.7 g/dL (ref 12.0–15.0)
Immature Granulocytes: 0 %
Lymphocytes Relative: 45 %
Lymphs Abs: 2.6 10*3/uL (ref 0.7–4.0)
MCH: 28.2 pg (ref 26.0–34.0)
MCHC: 32.2 g/dL (ref 30.0–36.0)
MCV: 87.6 fL (ref 80.0–100.0)
Monocytes Absolute: 0.6 10*3/uL (ref 0.1–1.0)
Monocytes Relative: 10 %
Neutro Abs: 2.5 10*3/uL (ref 1.7–7.7)
Neutrophils Relative %: 44 %
Platelets: 138 10*3/uL — ABNORMAL LOW (ref 150–400)
RBC: 4.85 MIL/uL (ref 3.87–5.11)
RDW: 16.5 % — ABNORMAL HIGH (ref 11.5–15.5)
WBC: 5.8 10*3/uL (ref 4.0–10.5)
nRBC: 0 % (ref 0.0–0.2)

## 2022-05-22 LAB — C-REACTIVE PROTEIN: CRP: 0.9 mg/dL (ref <1.0)

## 2022-05-22 LAB — COMPREHENSIVE METABOLIC PANEL
ALT: 16 U/L (ref 0–44)
AST: 29 U/L (ref 15–41)
Albumin: 3.4 g/dL — ABNORMAL LOW (ref 3.5–5.0)
Alkaline Phosphatase: 89 U/L (ref 38–126)
Anion gap: 8 (ref 5–15)
BUN: 15 mg/dL (ref 8–23)
CO2: 28 mmol/L (ref 22–32)
Calcium: 8.4 mg/dL — ABNORMAL LOW (ref 8.9–10.3)
Chloride: 101 mmol/L (ref 98–111)
Creatinine, Ser: 0.93 mg/dL (ref 0.44–1.00)
GFR, Estimated: >60 mL/min (ref >60)
Glucose, Bld: 118 mg/dL — ABNORMAL HIGH (ref 70–99)
Potassium: 3.7 mmol/L (ref 3.5–5.1)
Sodium: 137 mmol/L (ref 135–145)
Total Bilirubin: 0.5 mg/dL (ref 0.3–1.2)
Total Protein: 6.9 g/dL (ref 6.5–8.1)

## 2022-05-22 LAB — PROCALCITONIN: Procalcitonin: <0.1 ng/mL

## 2022-05-22 LAB — MAGNESIUM: Magnesium: 2 mg/dL (ref 1.7–2.4)

## 2022-05-22 MED ORDER — GABAPENTIN 100 MG PO CAPS
100.0000 mg | ORAL_CAPSULE | Freq: Three times a day (TID) | ORAL | Status: DC
  Administered 2022-05-22 – 2022-05-24 (×7): 100 mg via ORAL
  Filled 2022-05-22 (×7): qty 1

## 2022-05-22 MED ORDER — FUROSEMIDE 40 MG PO TABS
40.0000 mg | ORAL_TABLET | Freq: Two times a day (BID) | ORAL | Status: DC
  Administered 2022-05-22 – 2022-05-24 (×4): 40 mg via ORAL
  Filled 2022-05-22 (×4): qty 1

## 2022-05-22 MED ORDER — OXYCODONE-ACETAMINOPHEN 5-325 MG PO TABS
1.0000 | ORAL_TABLET | Freq: Four times a day (QID) | ORAL | Status: DC | PRN
  Administered 2022-05-22 – 2022-05-23 (×4): 1 via ORAL
  Filled 2022-05-22 (×5): qty 1

## 2022-05-22 MED ORDER — ACETAMINOPHEN 325 MG PO TABS
650.0000 mg | ORAL_TABLET | Freq: Two times a day (BID) | ORAL | Status: DC
  Administered 2022-05-22 – 2022-05-24 (×4): 650 mg via ORAL
  Filled 2022-05-22 (×5): qty 2

## 2022-05-22 MED ORDER — DULOXETINE HCL 30 MG PO CPEP
30.0000 mg | ORAL_CAPSULE | Freq: Every day | ORAL | Status: DC
  Administered 2022-05-22 – 2022-05-24 (×3): 30 mg via ORAL
  Filled 2022-05-22 (×3): qty 1

## 2022-05-22 NOTE — Assessment & Plan Note (Addendum)
Longstanding substantial lymphedema to lower extremities with associated substantial bilateral lower extremity pain and progressive weakness and debility Edema and redness seem to be somewhat improved today on increased dose of Lasix Continuing Lasix twice daily to address degree of edema Patient will need outpatient lymphedema clinic follow-up

## 2022-05-22 NOTE — Progress Notes (Signed)
PROGRESS NOTE   Denise Kelly  [id number] DOB: September 22, 1940 DOA: 05/20/2022 PCP: [name], MD   Date of Service: the patient was seen and examined on 05/22/2022  Brief Narrative:  82 y.o. female with medical history significant for diastolic congestive heart failure (echo 12/2017 EF 60-65% with G1DD),  lymphedema, chronic pain syndrome, restless leg syndrome, GERD, hypertension, history of pulmonary embolism and DVT on Eliquis, who presented to [facility] ED from home via EMS after being found lying in her own urine, complaining of severe bilateral lower extremity pain.    Of note, patient was recently admitted to [facility] from 2/4 until 2/7 for cellulitis of the bilateral lower extremities.  Patient was also found to be markedly weak during that hospitalization.  Patient was felt to benefit from skilled physical therapy services in a skilled nursing facility but declined and instead was discharged home on 2/7.  Patient then presented again to [facility] on 2/12 with worsening pain and weakness.  Due to persisting severe weakness, patient was initially discharged to skilled nursing facility on 2/16.   Patient now presents to [facility] emergency department again with severe bilateral lower extremity pain 2 days after being discharged from [facility] skilled nursing facility.  Upon evaluation in the emergency department patient was clinically thought to be suffering from recurrent bilateral lower extremity cellulitis and concurrent weakness.  The hospitalist group was then called to assess the patient for admission the hospital.   Assessment and Plan: * Bilateral lower extremity pain Patient continues to not exhibit any fever  Procalcitonin unremarkable.  CRP unremarkable.  White blood cell count unremarkable.   Per my review of the patient's chart, it seems that patient has had some degree of bilateral lower extremity pain and redness since at least 2018 when she was  evaluated by Dr. Donzetta Matters in vascular clinic I therefore think it is unlikely that the patient is suffering from an acute bout of cellulitis and will discontinue antibiotics at this time Patient is likely suffering from her longstanding lymphedema.  Severe chronic pain is likely secondary to discomfort related to the degree of edema with perhaps superimposed neuropathy For pain management placing patient on scheduled Tylenol twice daily, duloxetine 30 mg daily and gabapentin 100 mg 3 times daily Blood cultures remain negative   Lymphedema of both lower extremities Longstanding substantial lymphedema to lower extremities with associated substantial bilateral lower extremity pain and progressive weakness and debility Ability to perform ADLs impeded by severe pain Initiating nonnarcotic pain regimen as mentioned above Also increasing Lasix to twice daily to address degree of edema  Generalized weakness Longstanding severe symptoms of the bilateral lower extremities is leading to progressive debility and inability to ambulate due to deconditioning PT and OT evaluation recommending skilled physical therapy services in a skilled nursing facility  Chronic diastolic CHF (congestive heart failure) (HCC) Increase Lasix to twice daily to address edema as mentioned above although lymphedema is more related to lymphedema rather than congestive heart failure. Strict input and output monitoring Daily weights Low-sodium diet    Essential hypertension Documented history of hypertension Normotensive As needed intravenous antihypertensives for markedly elevated blood pressure  Personal history of thromboembolic disease History of pulmonary embolism and DVT  Continue Eliquis   Hypokalemia Replaced   GERD without esophagitis Continuing home regimen of daily PPI therapy.         Subjective:  Patient continuing to complain of severe bilateral lower extremity pain, sharp in quality, worse  with  weightbearing, radiating proximally.  This is associated with ongoing redness of the bilateral lower extremities.  Physical Exam:  Vitals:   05/21/22 1254 05/21/22 2235 05/22/22 0601 05/22/22 1356  BP: 110/63 127/73 (!) 148/88 108/61  Pulse: 90 88 95 87  Resp: '18 18 16 18  '$ Temp: 98.6 F (37 C) 98 F (36.7 C) 97.9 F (36.6 C) 98.1 F (36.7 C)  TempSrc:   Oral   SpO2: 93% 92% 93% 90%  Weight:      Height:        Constitutional: Awake alert and oriented x3, patient is in distress due to pain Skin: No change in significant hyperemia of the bilateral lower extremities, particularly the anterior surfaces with evidence of diffuse excoriations and blistering.  Multiple areas of skin breakdown. Eyes: Pupils are equally reactive to light.  No evidence of scleral icterus or conjunctival pallor.  ENMT: Moist mucous membranes noted.  Posterior pharynx clear of any exudate or lesions.   Respiratory: clear to auscultation bilaterally, no wheezing, no crackles. Normal respiratory effort. No accessory muscle use.  Cardiovascular: Regular rate and rhythm, no murmurs / rubs / gallops. No extremity edema. 2+ pedal pulses. No carotid bruits.  Abdomen: Abdomen is soft and nontender.  No evidence of intra-abdominal masses.  Positive bowel sounds noted in all quadrants.   Musculoskeletal: Diffuse severe tenderness of the distal bilateral lower extremities from the feet all the way up to the knees.   Data Reviewed:  I have personally reviewed and interpreted labs, imaging.  Significant findings are   CBC: Recent Labs  Lab 05/20/22 2241 05/21/22 0334 05/22/22 0310  WBC 4.2 5.0 5.8  NEUTROABS 2.1  --  2.5  HGB 14.3 13.3 13.7  HCT 43.0 40.6 42.5  MCV 85.7 86.4 87.6  PLT 149* 138* 0000000*   Basic Metabolic Panel: Recent Labs  Lab 05/20/22 2241 05/21/22 0334 05/22/22 0310  NA 137 138 137  K 3.2* 3.3* 3.7  CL 100 103 101  CO2 '29 28 28  '$ GLUCOSE 100* 100* 118*  BUN '11 10 15  '$ CREATININE 0.76  0.72 0.93  CALCIUM 8.5* 8.2* 8.4*  MG  --   --  2.0   GFR: Estimated Creatinine Clearance: 55.8 mL/min (by C-G formula based on SCr of 0.93 mg/dL). Liver Function Tests: Recent Labs  Lab 05/20/22 2241 05/22/22 0310  AST 32 29  ALT 19 16  ALKPHOS 95 89  BILITOT 0.7 0.5  PROT 7.1 6.9  ALBUMIN 3.6 3.4*    Coagulation Profile: Recent Labs  Lab 05/20/22 2241  INR 1.1      Code Status:  Full code.    Severity of Illness:  The appropriate patient status for this patient is INPATIENT. Inpatient status is judged to be reasonable and necessary in order to provide the required intensity of service to ensure the patient's safety. The patient's presenting symptoms, physical exam findings, and initial radiographic and laboratory data in the context of their chronic comorbidities is felt to place them at high risk for further clinical deterioration. Furthermore, it is not anticipated that the patient will be medically stable for discharge from the hospital within 2 midnights of admission.   * I certify that at the point of admission it is my clinical judgment that the patient will require inpatient hospital care spanning beyond 2 midnights from the point of admission due to high intensity of service, high risk for further deterioration and high frequency of surveillance required.*  Time spent:  50 minutes  Author:  Vernelle Emerald MD  05/22/2022 8:17 PM

## 2022-05-23 LAB — COMPREHENSIVE METABOLIC PANEL
ALT: 16 U/L (ref 0–44)
AST: 24 U/L (ref 15–41)
Albumin: 3.2 g/dL — ABNORMAL LOW (ref 3.5–5.0)
Alkaline Phosphatase: 89 U/L (ref 38–126)
Anion gap: 9 (ref 5–15)
BUN: 15 mg/dL (ref 8–23)
CO2: 25 mmol/L (ref 22–32)
Calcium: 8 mg/dL — ABNORMAL LOW (ref 8.9–10.3)
Chloride: 100 mmol/L (ref 98–111)
Creatinine, Ser: 0.81 mg/dL (ref 0.44–1.00)
GFR, Estimated: >60 mL/min (ref >60)
Glucose, Bld: 131 mg/dL — ABNORMAL HIGH (ref 70–99)
Potassium: 3.1 mmol/L — ABNORMAL LOW (ref 3.5–5.1)
Sodium: 134 mmol/L — ABNORMAL LOW (ref 135–145)
Total Bilirubin: 0.6 mg/dL (ref 0.3–1.2)
Total Protein: 6.4 g/dL — ABNORMAL LOW (ref 6.5–8.1)

## 2022-05-23 LAB — CBC WITH DIFFERENTIAL/PLATELET
Abs Immature Granulocytes: 0.01 10*3/uL (ref 0.00–0.07)
Basophils Absolute: 0 10*3/uL (ref 0.0–0.1)
Basophils Relative: 0 %
Eosinophils Absolute: 0.2 10*3/uL (ref 0.0–0.5)
Eosinophils Relative: 3 %
HCT: 42.8 % (ref 36.0–46.0)
Hemoglobin: 13.5 g/dL (ref 12.0–15.0)
Immature Granulocytes: 0 %
Lymphocytes Relative: 48 %
Lymphs Abs: 2.6 10*3/uL (ref 0.7–4.0)
MCH: 28 pg (ref 26.0–34.0)
MCHC: 31.5 g/dL (ref 30.0–36.0)
MCV: 88.8 fL (ref 80.0–100.0)
Monocytes Absolute: 0.5 10*3/uL (ref 0.1–1.0)
Monocytes Relative: 9 %
Neutro Abs: 2.2 10*3/uL (ref 1.7–7.7)
Neutrophils Relative %: 40 %
Platelets: 128 10*3/uL — ABNORMAL LOW (ref 150–400)
RBC: 4.82 MIL/uL (ref 3.87–5.11)
RDW: 16.3 % — ABNORMAL HIGH (ref 11.5–15.5)
WBC: 5.5 10*3/uL (ref 4.0–10.5)
nRBC: 0 % (ref 0.0–0.2)

## 2022-05-23 LAB — MAGNESIUM: Magnesium: 1.7 mg/dL (ref 1.7–2.4)

## 2022-05-23 MED ORDER — POLYETHYLENE GLYCOL 3350 17 G PO PACK: 17.0000 g | PACK | Freq: Every day | ORAL | Status: DC | PRN

## 2022-05-23 MED ORDER — PRAMIPEXOLE DIHYDROCHLORIDE 0.25 MG PO TABS: 0.1250 mg | ORAL_TABLET | Freq: Every day | ORAL | Status: DC

## 2022-05-23 MED ORDER — PANTOPRAZOLE SODIUM 40 MG PO TBEC: 40.0000 mg | DELAYED_RELEASE_TABLET | Freq: Every day | ORAL | Status: DC

## 2022-05-23 MED ORDER — POTASSIUM CHLORIDE CRYS ER 20 MEQ PO TBCR
20.0000 meq | EXTENDED_RELEASE_TABLET | Freq: Every day | ORAL | Status: DC
  Administered 2022-05-24: 20 meq via ORAL
  Filled 2022-05-23: qty 1

## 2022-05-23 MED ORDER — SENNOSIDES-DOCUSATE SODIUM 8.6-50 MG PO TABS
2.0000 | ORAL_TABLET | Freq: Every day | ORAL | Status: DC
  Filled 2022-05-23: qty 2

## 2022-05-23 MED ORDER — POTASSIUM CHLORIDE CRYS ER 20 MEQ PO TBCR
40.0000 meq | EXTENDED_RELEASE_TABLET | ORAL | Status: AC
  Administered 2022-05-23 (×2): 40 meq via ORAL
  Filled 2022-05-23 (×2): qty 2

## 2022-05-23 NOTE — Progress Notes (Addendum)
PROGRESS NOTE   Denise Kelly  [id number] DOB: 1940-06-06 DOA: 05/20/2022 PCP: [name], MD   Date of Service: the patient was seen and examined on 05/23/2022  Brief Narrative:  82 y.o. female with medical history significant for diastolic congestive heart failure (echo 12/2017 EF 60-65% with G1DD),  lymphedema, chronic pain syndrome, restless leg syndrome, GERD, hypertension, history of pulmonary embolism and DVT on Eliquis, who presented to [facility] ED from home via EMS after being found lying in her own urine, complaining of severe bilateral lower extremity pain.    Of note, patient was recently admitted to [facility] from 2/4 until 2/7 for cellulitis of the bilateral lower extremities.  Patient was also found to be markedly weak during that hospitalization.  Patient was felt to benefit from skilled physical therapy services in a skilled nursing facility but declined and instead was discharged home on 2/7.  Patient then presented again to [facility] on 2/12 with worsening pain and weakness.  Due to persisting severe weakness, patient was initially discharged to skilled nursing facility on 2/16.   Patient now presents to [facility] emergency department again with severe bilateral lower extremity pain 2 days after being discharged from [place] skilled nursing facility.  Upon evaluation in the emergency department patient was clinically thought to be suffering from recurrent bilateral lower extremity cellulitis and concurrent weakness.  The hospitalist group was then called to assess the patient for admission the hospital.   Assessment and Plan: * Bilateral lower extremity pain Patient continues to not exhibit any fever  Procalcitonin unremarkable.  CRP unremarkable.  White blood cell count unremarkable.   Per my review of the patient's chart, it seems that patient has had some degree of bilateral lower extremity pain and redness since at least 2018 when she was  evaluated by Dr. Donzetta Matters in vascular clinic I therefore think it is unlikely that the patient is suffering from an acute bout of cellulitis and will discontinue antibiotics at this time Patient is likely suffering from pain secondary to her longstanding lymphedema.  Severe chronic pain is likely secondary to discomfort related to the degree of edema with perhaps superimposed neuropathy Pain seems to be proving on combination of scheduled Tylenol twice daily, duloxetine 30 mg daily and gabapentin 100 mg 3 times daily with Lasix twice daily Blood cultures remain negative   Lymphedema of both lower extremities Longstanding substantial lymphedema to lower extremities with associated substantial bilateral lower extremity pain and progressive weakness and debility Edema and redness seem to be somewhat improved today on increased dose of Lasix Continuing Lasix twice daily to address degree of edema Patient will need outpatient lymphedema clinic follow-up  Generalized weakness Longstanding severe symptoms of the bilateral lower extremities is leading to progressive debility and inability to ambulate due to deconditioning PT and OT evaluation recommending skilled physical therapy services.  Patient evaluating various options including multiple skilled nursing facilities and potential of home health physical therapy  Chronic diastolic CHF (congestive heart failure) (HCC) Continuing to provide Lasix to twice daily to address edema as mentioned above Patient's peripheral edema is chronic and more related to lymphedema rather than congestive heart failure. Strict input and output monitoring Daily weights Low-sodium diet    Essential hypertension Documented history of hypertension Patient continues to be normotensive As needed intravenous antihypertensives for markedly elevated blood pressure  Personal history of thromboembolic disease History of pulmonary embolism and DVT  Continue Eliquis    Hypokalemia Recurrent Replacing  with potassium chloride Evaluating for concurrent hypomagnesemia  Monitoring potassium levels with serial chemistries.    GERD without esophagitis Continuing home regimen of daily PPI therapy.     Subjective:  Has bilateral lower extremity pain is seemingly improving.  Patient states that her pain is improved to the point where she is able to ambulate now pain is sharp in quality, worse with weightbearing, radiating proximally.  This is associated with ongoing redness of the bilateral lower extremities also seems to be improving.  Physical Exam:  Vitals:   05/22/22 1356 05/22/22 2108 05/23/22 0422 05/23/22 1219  BP: 108/61 129/66 127/68 120/65  Pulse: 87 78 80 81  Resp: '18 18 20 18  '$ Temp: 98.1 F (36.7 C) 98 F (36.7 C) 98.5 F (36.9 C) (!) 97.5 F (36.4 C)  TempSrc:  Oral Oral Oral  SpO2: 90% 93% 92% 94%  Weight:      Height:        Constitutional: Awake alert and oriented x3, no associated distress  skin: Improving redness of the bilateral lower extremities.  anterior surfaces of legs with evidence of diffuse excoriations and blistering.  Multiple areas of skin breakdown. Eyes: Pupils are equally reactive to light.  No evidence of scleral icterus or conjunctival pallor.  ENMT: Moist mucous membranes noted.  Posterior pharynx clear of any exudate or lesions.   Respiratory: clear to auscultation bilaterally, no wheezing, no crackles. Normal respiratory effort. No accessory muscle use.  Cardiovascular: Regular rate and rhythm, no murmurs / rubs / gallops.  Bilateral lower extremity pitting edema is improving.  2+ pedal pulses. No carotid bruits.  Abdomen: Abdomen is soft and nontender.  No evidence of intra-abdominal masses.  Positive bowel sounds noted in all quadrants.   Musculoskeletal: Diffuse severe tenderness of the distal bilateral lower extremities from the feet all the way up to the knees.   Data Reviewed:  I have personally  reviewed and interpreted labs, imaging.  Significant findings are   CBC: Recent Labs  Lab 05/20/22 2241 05/21/22 0334 05/22/22 0310 05/23/22 0325  WBC 4.2 5.0 5.8 5.5  NEUTROABS 2.1  --  2.5 2.2  HGB 14.3 13.3 13.7 13.5  HCT 43.0 40.6 42.5 42.8  MCV 85.7 86.4 87.6 88.8  PLT 149* 138* 138* 0000000*   Basic Metabolic Panel: Recent Labs  Lab 05/20/22 2241 05/21/22 0334 05/22/22 0310 05/23/22 0325  NA 137 138 137 134*  K 3.2* 3.3* 3.7 3.1*  CL 100 103 101 100  CO2 '29 28 28 25  '$ GLUCOSE 100* 100* 118* 131*  BUN '11 10 15 15  '$ CREATININE 0.76 0.72 0.93 0.81  CALCIUM 8.5* 8.2* 8.4* 8.0*  MG  --   --  2.0 1.7   GFR: Estimated Creatinine Clearance: 64 mL/min (by C-G formula based on SCr of 0.81 mg/dL). Liver Function Tests: Recent Labs  Lab 05/20/22 2241 05/22/22 0310 05/23/22 0325  AST 32 29 24  ALT '19 16 16  '$ ALKPHOS 95 89 89  BILITOT 0.7 0.5 0.6  PROT 7.1 6.9 6.4*  ALBUMIN 3.6 3.4* 3.2*    Coagulation Profile: Recent Labs  Lab 05/20/22 2241  INR 1.1      Code Status:  Full code.    Severity of Illness:  The appropriate patient status for this patient is INPATIENT. Inpatient status is judged to be reasonable and necessary in order to provide the required intensity of service to ensure the patient's safety. The patient's presenting symptoms, physical exam findings, and initial radiographic and  laboratory data in the context of their chronic comorbidities is felt to place them at high risk for further clinical deterioration. Furthermore, it is not anticipated that the patient will be medically stable for discharge from the hospital within 2 midnights of admission.   * I certify that at the point of admission it is my clinical judgment that the patient will require inpatient hospital care spanning beyond 2 midnights from the point of admission due to high intensity of service, high risk for further deterioration and high frequency of surveillance required.*  Time  spent:  36 minutes  Author:  Vernelle Emerald MD  05/23/2022 8:25 PM

## 2022-05-23 NOTE — Plan of Care (Signed)
  Problem: Clinical Measurements: Goal: Ability to maintain clinical measurements within normal limits will improve Outcome: Progressing   Problem: Pain Managment: Goal: General experience of comfort will improve Outcome: Progressing   Problem: Safety: Goal: Ability to remain free from injury will improve Outcome: Progressing   

## 2022-05-23 NOTE — TOC Progression Note (Addendum)
Transition of Care [facility]) - Progression Note    Patient Details  Name: Denise Kelly MRN: [id number] Date of Birth: June 29, 1940  Transition of Care [facility]) CM/SW Contact  Lennart Pall, LCSW Phone Number: 05/23/2022, 4:49 PM  Clinical Narrative:     Pt much improved with PT this afternoon and recommendation changed to dc home with HHPT follow up. Pt aware and agreeable.  MD anticipates dc tomorrow so have asked pt to begin securing dc transportation now.  Pt requests [facility] for her [facility] agency and have sent referral but awaiting word on acceptance.  No DME needs as pt has at home already.  Will let oncoming TOC know of change in plans.  ADDENDUM: [facility] unable to accept - reaching out to other agencies.  Expected Discharge Plan: [place] Barriers to Discharge: Barriers Resolved  Expected Discharge Plan and Services In-house Referral: Clinical Social Work   Post Acute Care Choice: [place] arrangements for the past 2 months: Apartment                                       Social Determinants of Health (SDOH) Interventions SDOH Screenings   Food Insecurity: Unknown (05/21/2022)  Recent Concern: [place] Present (04/29/2022)  Housing: Low Risk  (04/29/2022)  Transportation Needs: Unmet Transportation Needs (04/29/2022)  Utilities: Not At Risk (04/29/2022)  Depression (PHQ2-9): Low Risk  (01/09/2019)  Tobacco Use: Medium Risk (05/20/2022)    Readmission Risk Interventions    05/02/2022   11:19 AM  Readmission Risk Prevention Plan  Transportation Screening Complete  Medication Review (RN Care Manager) Complete  PCP or Specialist appointment within 3-5 days of discharge Complete  HRI or Home Care Consult Complete  SW Recovery Care/Counseling Consult Complete  Palliative Care Screening Not [place] Complete

## 2022-05-23 NOTE — Progress Notes (Signed)
Physical Therapy Treatment Patient Details Name: Denise Kelly MRN: [id number] DOB: Aug 18, 1940 Today's Date: 05/23/2022   History of Present Illness Patient is a 82 year old female who presented with pain a in bilateral lower extremities. Dx of recurrent cellulitis.  Of note patient was admitted from 2/4 to 2/7 with UTI and cellulitis with patient transitioning home after declining to go SNF, then admitted again 2/2-2/16 with BLE cellulitis, DC to SNF. PMH:  chronic pain syndrome, medical noncompliance, R TKA, L TKA, lumbar fusion.    PT Comments    Pt had significant improvement in activity tolerance today, she reports pain in LEs has decreased significantly. She ambulated 25' with RW, no loss of balance. She reports she has a neighbor that can assist her as needed at home and that she can use [employer] for grocery delivery. PT now recommending HHPT.   Recommendations for follow up therapy are one component of a multi-disciplinary discharge planning process, led by the attending physician.  Recommendations may be updated based on patient status, additional functional criteria and insurance authorization.  Follow Up Recommendations  Home health PT Can patient physically be transported by private vehicle: Yes   Assistance Recommended at Discharge Set up Supervision/Assistance  Patient can return home with the following A little help with bathing/dressing/bathroom;Assistance with cooking/housework;Assist for transportation;Help with stairs or ramp for entrance   Equipment Recommendations  None recommended by PT    Recommendations for Other Services       Precautions / Restrictions Precautions Precautions: Fall Precaution Comments: incontinent of urine Restrictions Weight Bearing Restrictions: No     Mobility  Bed Mobility Overal bed mobility: Needs Assistance Bed Mobility: Supine to Sit     Supine to sit: Min assist Sit to supine: Min assist   General bed mobility comments:  assist to raise trunk    Transfers Overall transfer level: Needs assistance Equipment used: Rolling walker (2 wheels) Transfers: Sit to/from Stand Sit to Stand: Min guard           General transfer comment: VCs for hand placement    Ambulation/Gait Ambulation/Gait assistance: Min guard Gait Distance (Feet): 90 Feet Assistive device: Rolling walker (2 wheels) Gait Pattern/deviations: Step-through pattern, Decreased stride length Gait velocity: decr     General Gait Details: steady, no loss of balance   Stairs             Wheelchair Mobility    Modified Rankin (Stroke Patients Only)       Balance Overall balance assessment: Needs assistance Sitting-balance support: No upper extremity supported, Feet supported Sitting balance-Leahy Scale: Good     Standing balance support: Reliant on assistive device for balance, During functional activity Standing balance-Leahy Scale: Poor                              Cognition Arousal/Alertness: Awake/alert Behavior During Therapy: WFL for tasks assessed/performed Overall Cognitive Status: Within Functional Limits for tasks assessed                                          Exercises      General Comments        Pertinent Vitals/Pain Pain Assessment Pain Assessment: No/denies pain Pain Score: 0-No pain Faces Pain Scale: No hurt    Home Living  Prior Function            PT Goals (current goals can now be found in the care plan section) Acute Rehab PT Goals Patient Stated Goal: pt now wants to DC home PT Goal Formulation: With patient Time For Goal Achievement: 06/04/22 Potential to Achieve Goals: Good Progress towards PT goals: Progressing toward goals    Frequency    Min 3X/week      PT Plan Discharge plan needs to be updated    Co-evaluation              AM-PAC PT "6 Clicks" Mobility   Outcome Measure  Help needed  turning from your back to your side while in a flat bed without using bedrails?: A Little Help needed moving from lying on your back to sitting on the side of a flat bed without using bedrails?: A Little Help needed moving to and from a bed to a chair (including a wheelchair)?: None Help needed standing up from a chair using your arms (e.g., wheelchair or bedside chair)?: None Help needed to walk in hospital room?: None Help needed climbing 3-5 steps with a railing? : A Lot 6 Click Score: 20    End of Session Equipment Utilized During Treatment: Gait belt Activity Tolerance: Patient tolerated treatment well Patient left: with call bell/phone within reach;with chair alarm set;in chair Nurse Communication: Mobility status PT Visit Diagnosis: Other abnormalities of gait and mobility (R26.89);Difficulty in walking, not elsewhere classified (R26.2);Muscle weakness (generalized) (M62.81)     Time: [id number] PT Time Calculation (min) (ACUTE ONLY): 21 min  Charges:  $Gait Training: 8-22 mins                     Blondell Reveal Kistler PT 05/23/2022  Acute Rehabilitation Services  Office [phone]

## 2022-05-23 NOTE — Plan of Care (Signed)
  Problem: Health Behavior/Discharge Planning: Goal: Ability to manage health-related needs will improve Outcome: Progressing   Problem: Activity: Goal: Risk for activity intolerance will decrease Outcome: Progressing   Problem: Coping: Goal: Level of anxiety will decrease Outcome: Progressing   Problem: Pain Managment: Goal: General experience of comfort will improve Outcome: Progressing   Problem: Safety: Goal: Ability to remain free from injury will improve Outcome: Progressing   

## 2022-05-23 NOTE — TOC Progression Note (Signed)
Transition of Care [facility]) - Progression Note    Patient Details  Name: AURIANNA CAMUSO MRN: [id number] Date of Birth: Dec 16, 1940  Transition of Care [facility]) CM/SW Contact  Lennart Pall, [place] Phone Number: 05/23/2022, 11:17 AM  Clinical Narrative:     Have reviewed SNF bed offers with pt and she has accepted a bed with [facility].  She is aware that she will be in her copay days in ~ 3 days upon dc to SNF.  Have discussed with her the need to begin a Medicaid application and will provide her with instructions on how to do this.  She states she understands.  Will being insurance authorization for [employer].  Expected Discharge Plan: Skilled Nursing Facility Barriers to Discharge: Ship broker, SNF Pending bed offer  Expected Discharge Plan and Services In-house Referral: Clinical Social Work   Post Acute Care Choice: [place] Living arrangements for the past 2 months: Apartment                                       Social Determinants of Health (SDOH) Interventions SDOH Screenings   Food Insecurity: Unknown (05/21/2022)  Recent Concern: [place] Present (04/29/2022)  Housing: Low Risk  (04/29/2022)  Transportation Needs: Unmet Transportation Needs (04/29/2022)  Utilities: Not At Risk (04/29/2022)  Depression (PHQ2-9): Low Risk  (01/09/2019)  Tobacco Use: Medium Risk (05/20/2022)    Readmission Risk Interventions    05/02/2022   11:19 AM  Readmission Risk Prevention Plan  Transportation Screening Complete  Medication Review (RN Care Manager) Complete  PCP or Specialist appointment within 3-5 days of discharge Complete  HRI or Home Care Consult Complete  SW Recovery Care/Counseling Consult Complete  Palliative Care Screening Not [place] Complete

## 2022-05-24 ENCOUNTER — Other Ambulatory Visit (HOSPITAL_COMMUNITY): Payer: Self-pay

## 2022-05-24 LAB — CBC WITH DIFFERENTIAL/PLATELET
Abs Immature Granulocytes: 0.01 10*3/uL (ref 0.00–0.07)
Basophils Absolute: 0 10*3/uL (ref 0.0–0.1)
Basophils Relative: 1 %
Eosinophils Absolute: 0.3 10*3/uL (ref 0.0–0.5)
Eosinophils Relative: 4 %
HCT: 41.3 % (ref 36.0–46.0)
Hemoglobin: 13.4 g/dL (ref 12.0–15.0)
Immature Granulocytes: 0 %
Lymphocytes Relative: 47 %
Lymphs Abs: 2.9 10*3/uL (ref 0.7–4.0)
MCH: 28.3 pg (ref 26.0–34.0)
MCHC: 32.4 g/dL (ref 30.0–36.0)
MCV: 87.1 fL (ref 80.0–100.0)
Monocytes Absolute: 0.5 10*3/uL (ref 0.1–1.0)
Monocytes Relative: 8 %
Neutro Abs: 2.4 10*3/uL (ref 1.7–7.7)
Neutrophils Relative %: 40 %
Platelets: 124 10*3/uL — ABNORMAL LOW (ref 150–400)
RBC: 4.74 MIL/uL (ref 3.87–5.11)
RDW: 15.6 % — ABNORMAL HIGH (ref 11.5–15.5)
WBC: 6.1 10*3/uL (ref 4.0–10.5)
nRBC: 0 % (ref 0.0–0.2)

## 2022-05-24 LAB — COMPREHENSIVE METABOLIC PANEL
ALT: 14 U/L (ref 0–44)
AST: 24 U/L (ref 15–41)
Albumin: 3.1 g/dL — ABNORMAL LOW (ref 3.5–5.0)
Alkaline Phosphatase: 82 U/L (ref 38–126)
Anion gap: 10 (ref 5–15)
BUN: 15 mg/dL (ref 8–23)
CO2: 29 mmol/L (ref 22–32)
Calcium: 8.2 mg/dL — ABNORMAL LOW (ref 8.9–10.3)
Chloride: 98 mmol/L (ref 98–111)
Creatinine, Ser: 0.69 mg/dL (ref 0.44–1.00)
GFR, Estimated: >60 mL/min (ref >60)
Glucose, Bld: 113 mg/dL — ABNORMAL HIGH (ref 70–99)
Potassium: 3.1 mmol/L — ABNORMAL LOW (ref 3.5–5.1)
Sodium: 137 mmol/L (ref 135–145)
Total Bilirubin: 0.5 mg/dL (ref 0.3–1.2)
Total Protein: 6.4 g/dL — ABNORMAL LOW (ref 6.5–8.1)

## 2022-05-24 LAB — MAGNESIUM: Magnesium: 1.8 mg/dL (ref 1.7–2.4)

## 2022-05-24 MED ORDER — GABAPENTIN 100 MG PO CAPS: 100.0000 mg | ORAL_CAPSULE | Freq: Three times a day (TID) | ORAL | 2 refills | Status: DC

## 2022-05-24 MED ORDER — GABAPENTIN 100 MG PO CAPS
100.0000 mg | ORAL_CAPSULE | Freq: Three times a day (TID) | ORAL | 2 refills | Status: DC
  Filled 2022-05-24: qty 90, 30d supply, fill #0
  Filled 2022-06-24: qty 90, 30d supply, fill #1
  Filled 2022-07-29: qty 90, 30d supply, fill #2

## 2022-05-24 MED ORDER — DULOXETINE HCL 30 MG PO CPEP
30.0000 mg | ORAL_CAPSULE | Freq: Every day | ORAL | 2 refills | Status: DC
  Filled 2022-05-24 (×2): qty 30, 30d supply, fill #0
  Filled 2022-06-24: qty 30, 30d supply, fill #1
  Filled 2022-08-01: qty 30, 30d supply, fill #2

## 2022-05-24 MED ORDER — SENNOSIDES-DOCUSATE SODIUM 8.6-50 MG PO TABS
2.0000 | ORAL_TABLET | Freq: Every day | ORAL | 2 refills | Status: DC
  Filled 2022-05-24: qty 60, 30d supply, fill #0

## 2022-05-24 MED ORDER — FUROSEMIDE 40 MG PO TABS
40.0000 mg | ORAL_TABLET | Freq: Two times a day (BID) | ORAL | 2 refills | Status: DC
  Filled 2022-05-24 (×2): qty 60, 30d supply, fill #0
  Filled 2022-06-24: qty 60, 30d supply, fill #1
  Filled 2022-08-01: qty 60, 30d supply, fill #2

## 2022-05-24 MED ORDER — POTASSIUM CHLORIDE CRYS ER 20 MEQ PO TBCR
40.0000 meq | EXTENDED_RELEASE_TABLET | Freq: Once | ORAL | Status: AC
  Administered 2022-05-24: 40 meq via ORAL
  Filled 2022-05-24: qty 2

## 2022-05-24 MED ORDER — ACETAMINOPHEN 325 MG PO TABS
650.0000 mg | ORAL_TABLET | Freq: Two times a day (BID) | ORAL | 2 refills | Status: DC
  Filled 2022-05-24: qty 60, 15d supply, fill #0

## 2022-05-24 MED ORDER — POTASSIUM CHLORIDE ER 20 MEQ PO TBCR
1.0000 | EXTENDED_RELEASE_TABLET | Freq: Two times a day (BID) | ORAL | 2 refills | Status: DC
  Filled 2022-05-24: qty 60, 30d supply, fill #0
  Filled 2022-06-26: qty 60, 30d supply, fill #1

## 2022-05-24 NOTE — Discharge Instructions (Signed)
Please take all prescribed medications exactly as instructed Please follow-up with your primary care provider in 1 to 2 weeks Home health physical therapy and nursing services have been ordered for you.  Please participate in their care plan as recommended.  They should be arriving to your home to establish care in the next 1 to 3 days. Please consume a low-sodium diet Please ambulate using an assistive device such as a walker Please return to the emergency department if you develop worsening pain, weakness, fevers or inability to tolerate oral intake.

## 2022-05-24 NOTE — TOC Transition Note (Signed)
Transition of Care [facility]) - CM/SW Discharge Note  Patient Details  Name: Denise Kelly MRN: [id number] Date of Birth: 10/26/1940  Transition of [facility]) CM/SW Contact:  Sherie Don, LCSW Phone Number: 05/24/2022, 1:04 PM  Clinical Narrative: Patient has been set up with [facility] for [facility] services. HH orders have been placed. Patient will need taxi voucher to get home. Patient signed rider waiver, which was placed on the chart. RN provided taxi voucher and will call taxi once patient has someone to assist her with getting into her apartment. TOC signing off.  Final next level of care: Home w Home Health Services Barriers to Discharge: Barriers Resolved  Patient Goals and CMS Choice CMS Medicare.gov Compare Post Acute Care list provided to:: Patient Choice offered to / list presented to : Patient  Discharge Plan and Services Additional resources added to the After Visit Summary for   In-house Referral: Clinical Social Work Post Acute Care Choice: Home Health          HH Arranged: PT, RN, Nurse's Aide [facility] Agency: [facility] Date [facility] Agency Contacted: 05/23/22 Representative spoke with at [place]: [place] ([place]) Interventions SDOH Screenings   Food Insecurity: Unknown (05/21/2022)  Recent Concern: [place] Present (04/29/2022)  Housing: Low Risk  (04/29/2022)  Transportation Needs: Unmet Transportation Needs (04/29/2022)  Utilities: Not At Risk (04/29/2022)  Depression (PHQ2-9): Low Risk  (01/09/2019)  Tobacco Use: Medium Risk (05/20/2022)   Readmission Risk Interventions    05/02/2022   11:19 AM  Readmission Risk Prevention Plan  Transportation Screening Complete  Medication Review (RN Care Manager) Complete  PCP or Specialist appointment within 3-5 days of discharge Complete  HRI or Home Care Consult Complete  SW Recovery Care/Counseling Consult Complete  Palliative Care Screening Not Applicable  Skilled Nursing  Facility Complete

## 2022-05-24 NOTE — Discharge Summary (Signed)
Physician Discharge Summary   Patient: Denise Kelly MRN: [id number] DOB: 13-Dec-1940  Admit date:     05/20/2022  Discharge date: 05/24/22  Discharge Physician: Vernelle Emerald   PCP: [name], MD   Recommendations at discharge:   Patient has been instructed to follow-up with her primary care provider in 1 to 2 weeks and upon follow-up request a repeat chemistry to ensure renal function and electrolytes are in normal range since Lasix is been increased during this hospitalization. Please take all prescribed medications exactly as instructed Home health physical therapy and nursing services have been ordered for you.  Please participate in their care plan as recommended.  They should be arriving to your home to establish care in the next 1 to 3 days. Please consume a low-sodium diet Please ambulate using an assistive device such as a walker Please return to the emergency department if you develop worsening pain, weakness, fevers or inability to tolerate oral intake.  Discharge Diagnoses: Principal Problem:   Bilateral lower extremity pain Active Problems:   Lymphedema of both lower extremities   Generalized weakness   Chronic diastolic CHF (congestive heart failure) (HCC)   Essential hypertension   Personal history of thromboembolic disease   Hypokalemia   GERD without esophagitis  Resolved Problems:   * No resolved hospital problems. *   Hospital Course: 82 y.o. female with medical history significant for diastolic congestive heart failure (echo 12/2017 EF 60-65% with G1DD),  lymphedema, chronic pain syndrome, restless leg syndrome, GERD, hypertension, history of pulmonary embolism and DVT on Eliquis, who presented to [facility] ED from home via EMS after being found lying in her own urine, complaining of severe bilateral lower extremity pain.    Of note, patient was recently admitted to [facility] from 2/4 until 2/7 for cellulitis of the bilateral lower extremities.   Patient was also found to be markedly weak during that hospitalization.  Patient was felt to benefit from skilled physical therapy services in a skilled nursing facility but declined and instead was discharged home on 2/7.  Patient then presented again to [facility] on 2/12 with worsening pain and weakness.  Due to persisting severe weakness, patient was initially discharged to skilled nursing facility on 2/16.   Patient presents to [facility] emergency department again with severe bilateral lower extremity pain 2 days after being discharged from [facility] skilled nursing facility.  Upon evaluation in the emergency department patient was clinically thought to be suffering from recurrent bilateral lower extremity cellulitis and concurrent weakness.  The hospitalist group was then called to assess the patient for admission the hospital.  Upon a thorough evaluation after hospitalization including obtaining inflammatory markers, procalcitonin and serial evaluations it was felt that the patient was indeed not suffering from cellulitis.  Her antibiotic regimen was therefore discontinued.  It was felt that the patient was suffering from chronic bilateral lower extremity pain due to longstanding bilateral lower extremity lymphedema.  Upon thorough chart review it seems the patient has been suffering from chronic pain and the sequela of her lymphedema since at least 2019.  Patient therefore was placed on a regimen of 650 mg of Tylenol twice daily, gabapentin 100 mg 3 times daily (was noncompliant prior), Cymbalta 30 mg daily and Lasix 40 mg by mouth twice daily.  After this regimen was initiated patient reported a substantial improvement in her pain.  Patient was able to participate in assessments with physical therapy and therefore they recommended home  health physical therapy going forward.  Arranges were made for the patient to be discharged home in improved and stable condition with home  health services including home health physical therapy, nursing and an aide.  Patient was instructed to follow-up closely with her primary care provider who will likely obtain a repeat chemistry considering patient's recently increased regimen of Lasix.    Pain control - [employer] Controlled Substance Reporting System database was reviewed. and patient was instructed, not to drive, operate heavy machinery, perform activities at heights, swimming or participation in water activities or provide baby-sitting services while on Pain, Sleep and Anxiety Medications; until their outpatient Physician has advised to do so again. Also recommended to not to take more than prescribed Pain, Sleep and Anxiety Medications.   Consultants: None Procedures performed: None  Disposition: Home health Diet recommendation:  Cardiac diet  DISCHARGE MEDICATION: Allergies as of 05/24/2022       Reactions   Sulfa Antibiotics Hives, Swelling, Other (See Comments)   Facial/eye swelling    Coreg [carvedilol] Other (See Comments)   Memory loss   Motrin [ibuprofen] Palpitations   Toprol Xl [metoprolol Tartrate] Cough   Doxycycline Hyclate Other (See Comments)   HEARTBURN   Flovent Hfa [fluticasone] Itching   Statins Other (See Comments)   MENTAL STATUS CHANGE        Medication List     STOP taking these medications    dicloxacillin 250 MG capsule Commonly known as: DYNAPEN   guaiFENesin 600 MG 12 hr tablet Commonly known as: Mucinex   lidocaine 5 % Commonly known as: LIDODERM   Magnesium Oxide 400 MG Caps   potassium chloride SA 20 MEQ tablet Commonly known as: KLOR-CON M       TAKE these medications    acetaminophen 325 MG tablet Commonly known as: TYLENOL Take 2 tablets (650 mg total) by mouth 2 (two) times daily. What changed:  medication strength how much to take when to take this reasons to take this   cyanocobalamin 1000 MCG tablet Take 1 tablet (1,000 mcg total) by mouth  daily.   DULoxetine 30 MG capsule Commonly known as: CYMBALTA Take 1 capsule (30 mg total) by mouth daily. Start taking on: May 25, 2022   Eliquis 5 MG Tabs tablet Generic drug: apixaban Take 5 mg by mouth 2 (two) times daily.   folic acid 1 MG tablet Commonly known as: FOLVITE Take 1 tablet (1 mg total) by mouth daily.   furosemide 40 MG tablet Commonly known as: LASIX Take 1 tablet (40 mg total) by mouth 2 (two) times daily. What changed: when to take this   gabapentin 100 MG capsule Commonly known as: NEURONTIN Take 1 capsule (100 mg total) by mouth 3 (three) times daily.   melatonin 3 MG Tabs tablet Take 1 tablet (3 mg total) by mouth at bedtime.   multivitamin with minerals Tabs tablet Take 1 tablet by mouth daily.   ondansetron 4 MG tablet Commonly known as: ZOFRAN Take 4 mg by mouth every 8 (eight) hours as needed for nausea or vomiting.   pantoprazole 40 MG tablet Commonly known as: PROTONIX Take 1 tablet (40 mg total) by mouth daily.   polyethylene glycol 17 g packet Commonly known as: MIRALAX / GLYCOLAX Take 17 g by mouth daily. Hold if diarrhea   Potassium Chloride ER 20 MEQ Tbcr Take 1 tablet (20 mEq total) by mouth in the morning and at bedtime. What changed: when to take this   pramipexole  0.125 MG tablet Commonly known as: MIRAPEX Take 0.125 mg by mouth daily.   senna-docusate 8.6-50 MG tablet Commonly known as: Senokot-S Take 2 tablets by mouth at bedtime. What changed:  how much to take additional instructions   thiamine 100 MG tablet Commonly known as: VITAMIN B1 Take 1 tablet (100 mg total) by mouth daily.   traMADol 50 MG tablet Commonly known as: ULTRAM Take 50 mg by mouth every 6 (six) hours as needed for moderate pain.   traZODone 100 MG tablet Commonly known as: DESYREL Take 1 tablet (100 mg total) by mouth at bedtime as needed for sleep.        Contact information for follow-up providers     [name], MD. Schedule  an appointment as soon as possible for a visit in 1 week(s).   Specialty: Family Medicine Contact information: [place] [address] [phone]              Contact information for after-discharge care     Destination     [facility] Preferred SNF .   Service: Skilled Nursing Contact information: [place] [phone]                     Discharge Exam: Danley Danker Weights   05/20/22 2255  Weight: 90.7 kg    Constitutional: Awake alert and oriented x3, no associated distress.   Respiratory: clear to auscultation bilaterally, no wheezing, no crackles. Normal respiratory effort. No accessory muscle use.  Cardiovascular: Regular rate and rhythm, no murmurs / rubs / gallops.  Mild edema of the distal bilateral lower extremities.  2+ pedal pulses. No carotid bruits.  Abdomen: Abdomen is soft and nontender.  No evidence of intra-abdominal masses.  Positive bowel sounds noted in all quadrants.   Musculoskeletal: Tenderness of the bilateral lower extremities has improved.  Condition at discharge: fair  The results of significant diagnostics from this hospitalization (including imaging, microbiology, ancillary and laboratory) are listed below for reference.   Imaging Studies: DG Chest Port 1 View  Result Date: 05/20/2022 CLINICAL DATA:  Possible sepsis EXAM: PORTABLE CHEST 1 VIEW COMPARISON:  04/29/2022 FINDINGS: Cardiac shadow is stable. The lungs are well aerated bilaterally. No focal infiltrate or sizable effusion is seen. No bony abnormality is noted. IMPRESSION: No active disease. Electronically Signed   By: Inez Catalina M.D.   On: 05/20/2022 22:18   DG KNEE 3 VIEW RIGHT  Result Date: 04/30/2022 CLINICAL DATA:  Hip fracture and knee injury. EXAM: RIGHT KNEE - 3 VIEW COMPARISON:  None Available. FINDINGS: Right total knee arthroplasty with patellar femoral component. Components  appear well seated. No evidence of acute fracture or dislocation. No focal bone lesion or bone destruction. Bone cortex appears intact. No significant effusions. IMPRESSION: Right total knee arthroplasty. Components appear well seated. No acute displaced fractures identified. Electronically Signed   By: Lucienne Capers M.D.   On: 04/30/2022 00:27   DG Pelvis 1-2 Views  Result Date: 04/30/2022 CLINICAL DATA:  Hip fracture and knee injury. EXAM: PELVIS - 1-2 VIEW COMPARISON:  CT 09/30/2019 FINDINGS: Postoperative changes with posterior fixation of the lower lumbar spine and internal fixation of an old fracture of the left proximal femur. Degenerative changes in both hips. No acute fracture or dislocation is identified. No focal bone lesions. SI joints and symphysis pubis are not displaced. IMPRESSION: Degenerative changes in the hips. No acute displaced fractures are identified. Old postoperative  changes as above. Electronically Signed   By: Lucienne Capers M.D.   On: 04/30/2022 00:26   DG Chest 1 View  Result Date: 04/29/2022 CLINICAL DATA:  Pneumonia EXAM: CHEST  1 VIEW COMPARISON:  Radiograph 01/13/2021 FINDINGS: Unchanged cardiomediastinal silhouette. There is no focal airspace consolidation. There is no pleural effusion or evidence of pneumothorax. No acute osseous abnormality. Thoracic spondylosis. Bilateral shoulder degenerative changes. IMPRESSION: No evidence of acute cardiopulmonary disease. Electronically Signed   By: Maurine Simmering M.D.   On: 04/29/2022 09:06    Microbiology: Results for orders placed or performed during the hospital encounter of 05/20/22  Blood Culture (routine x 2)     Status: None (Preliminary result)   Collection Time: 05/20/22 10:43 PM   Specimen: BLOOD  Result Value Ref Range Status   Specimen Description   Final    BLOOD RIGHT ANTECUBITAL Performed at [facility] [address] [place], [place] [address]    Special Requests   Final     BOTTLES DRAWN AEROBIC AND ANAEROBIC Blood Culture results may not be optimal due to an excessive volume of blood received in culture bottles Performed at [facility] [address], [place], [place] [address]    Culture   Final    NO GROWTH 4 DAYS Performed at [facility], [place] [address], [place], [place] [address]    Report Status PENDING  Incomplete  Blood Culture (routine x 2)     Status: None (Preliminary result)   Collection Time: 05/21/22  3:34 AM   Specimen: BLOOD  Result Value Ref Range Status   Specimen Description   Final    BLOOD BLOOD LEFT ARM Performed at [facility] [address], [place], [place] [address]    Special Requests   Final    BOTTLES DRAWN AEROBIC ONLY Blood Culture adequate volume Performed at [facility] [address] [place], [place] [address]    Culture   Final    NO GROWTH 3 DAYS Performed at [facility], [place] [address], [place],  [address]    Report Status PENDING  Incomplete  MRSA Next Gen by PCR, Nasal     Status: None   Collection Time: 05/21/22  4:56 AM   Specimen: Nasal Mucosa; Nasal Swab  Result Value Ref Range Status   MRSA by PCR Next Gen NOT DETECTED NOT DETECTED Final    Comment: (NOTE) The GeneXpert MRSA Assay (FDA approved for NASAL specimens only), is one component of a comprehensive MRSA colonization surveillance program. It is not intended to diagnose MRSA infection nor to guide or monitor treatment for MRSA infections. Test performance is not FDA approved in patients less than 21 years old. Performed at [facility], [place] [address], [place],  [address]     Labs: CBC: Recent Labs  Lab 05/20/22 2241 05/21/22 0334 05/22/22 0310 05/23/22 0325 05/24/22 0311  WBC 4.2 5.0 5.8 5.5 6.1  NEUTROABS 2.1  --  2.5 2.2 2.4  HGB 14.3 13.3 13.7 13.5 13.4  HCT 43.0 40.6 42.5 42.8 41.3  MCV 85.7 86.4 87.6 88.8 87.1  PLT  149* 138* 138* 128* [id number]*   Basic Metabolic Panel: Recent Labs  Lab 05/20/22 2241 05/21/22 0334 05/22/22 0310 05/23/22 0325 05/24/22 0311  NA 137 138 137 134* 137  K 3.2* 3.3* 3.7 3.1* 3.1*  CL 100 103 101 100 98  CO2 '29 28 28 25 29  '$ GLUCOSE  100* 100* 118* 131* 113*  BUN '11 10 15 15 15  '$ CREATININE 0.76 0.72 0.93 0.81 0.69  CALCIUM 8.5* 8.2* 8.4* 8.0* 8.2*  MG  --   --  2.0 1.7 1.8   Liver Function Tests: Recent Labs  Lab 05/20/22 2241 05/22/22 0310 05/23/22 0325 05/24/22 0311  AST 32 '29 24 24  '$ ALT '19 16 16 14  '$ ALKPHOS 95 89 89 82  BILITOT 0.7 0.5 0.6 0.5  PROT 7.1 6.9 6.4* 6.4*  ALBUMIN 3.6 3.4* 3.2* 3.1*   CBG: No results for input(s): "GLUCAP" in the last 168 hours.  Discharge time spent: greater than 30 minutes.  Signed: Vernelle Emerald, MD [facility] 05/24/2022

## 2022-05-24 NOTE — Progress Notes (Signed)
Pt alert and oriented. No questions regarding discharge instructions. Discharge Meds delivered in room. Pt discharged home, friend giving her a ride to home.

## 2022-05-24 NOTE — Plan of Care (Signed)
  Problem: Health Behavior/Discharge Planning: Goal: Ability to manage health-related needs will improve Outcome: Progressing   Problem: Clinical Measurements: Goal: Ability to maintain clinical measurements within normal limits will improve Outcome: Progressing   Problem: Activity: Goal: Risk for activity intolerance will decrease Outcome: Progressing   Problem: Coping: Goal: Level of anxiety will decrease Outcome: Progressing   Problem: Pain Managment: Goal: General experience of comfort will improve Outcome: Progressing   

## 2022-05-25 LAB — CULTURE, BLOOD (ROUTINE X 2): Culture: NO GROWTH

## 2022-05-26 LAB — CULTURE, BLOOD (ROUTINE X 2)
Culture: NO GROWTH
Special Requests: ADEQUATE

## 2022-06-25 ENCOUNTER — Other Ambulatory Visit: Payer: Self-pay

## 2022-07-04 ENCOUNTER — Other Ambulatory Visit (HOSPITAL_COMMUNITY): Payer: Self-pay

## 2022-08-04 ENCOUNTER — Other Ambulatory Visit: Payer: Self-pay

## 2022-08-04 ENCOUNTER — Other Ambulatory Visit (HOSPITAL_COMMUNITY): Payer: Self-pay

## 2022-09-11 ENCOUNTER — Other Ambulatory Visit: Payer: Self-pay

## 2022-09-11 ENCOUNTER — Inpatient Hospital Stay (HOSPITAL_COMMUNITY)
Admission: EM | Admit: 2022-09-11 | Discharge: 2022-09-24 | DRG: 640 | Disposition: A | Discharge status: Skilled Nursing Facility | Payer: Medicare (Managed Care) | Attending: Family Medicine | Admitting: Family Medicine

## 2022-09-11 ENCOUNTER — Encounter (HOSPITAL_COMMUNITY): Payer: Self-pay

## 2022-09-11 DIAGNOSIS — M4317 Spondylolisthesis, lumbosacral region: Secondary | ICD-10-CM | POA: Diagnosis present

## 2022-09-11 DIAGNOSIS — I890 Lymphedema, not elsewhere classified: Secondary | ICD-10-CM | POA: Diagnosis present

## 2022-09-11 DIAGNOSIS — R5383 Other fatigue: Secondary | ICD-10-CM | POA: Diagnosis not present

## 2022-09-11 DIAGNOSIS — G894 Chronic pain syndrome: Secondary | ICD-10-CM | POA: Diagnosis present

## 2022-09-11 DIAGNOSIS — Z85828 Personal history of other malignant neoplasm of skin: Secondary | ICD-10-CM | POA: Diagnosis not present

## 2022-09-11 DIAGNOSIS — Z86718 Personal history of other venous thrombosis and embolism: Secondary | ICD-10-CM | POA: Diagnosis not present

## 2022-09-11 DIAGNOSIS — Z7901 Long term (current) use of anticoagulants: Secondary | ICD-10-CM | POA: Diagnosis not present

## 2022-09-11 DIAGNOSIS — B961 Klebsiella pneumoniae [K. pneumoniae] as the cause of diseases classified elsewhere: Secondary | ICD-10-CM | POA: Diagnosis present

## 2022-09-11 DIAGNOSIS — M6281 Muscle weakness (generalized): Secondary | ICD-10-CM | POA: Diagnosis not present

## 2022-09-11 DIAGNOSIS — Z882 Allergy status to sulfonamides status: Secondary | ICD-10-CM

## 2022-09-11 DIAGNOSIS — Z8249 Family history of ischemic heart disease and other diseases of the circulatory system: Secondary | ICD-10-CM | POA: Diagnosis not present

## 2022-09-11 DIAGNOSIS — I110 Hypertensive heart disease with heart failure: Secondary | ICD-10-CM | POA: Diagnosis present

## 2022-09-11 DIAGNOSIS — Z87891 Personal history of nicotine dependence: Secondary | ICD-10-CM | POA: Diagnosis not present

## 2022-09-11 DIAGNOSIS — R531 Weakness: Secondary | ICD-10-CM | POA: Diagnosis not present

## 2022-09-11 DIAGNOSIS — G2581 Restless legs syndrome: Secondary | ICD-10-CM | POA: Diagnosis present

## 2022-09-11 DIAGNOSIS — N390 Urinary tract infection, site not specified: Secondary | ICD-10-CM | POA: Diagnosis present

## 2022-09-11 DIAGNOSIS — Z86711 Personal history of pulmonary embolism: Secondary | ICD-10-CM

## 2022-09-11 DIAGNOSIS — Z886 Allergy status to analgesic agent status: Secondary | ICD-10-CM | POA: Diagnosis not present

## 2022-09-11 DIAGNOSIS — M1990 Unspecified osteoarthritis, unspecified site: Secondary | ICD-10-CM | POA: Diagnosis not present

## 2022-09-11 DIAGNOSIS — I444 Left anterior fascicular block: Secondary | ICD-10-CM | POA: Diagnosis present

## 2022-09-11 DIAGNOSIS — Z888 Allergy status to other drugs, medicaments and biological substances status: Secondary | ICD-10-CM

## 2022-09-11 DIAGNOSIS — T50915A Adverse effect of multiple unspecified drugs, medicaments and biological substances, initial encounter: Secondary | ICD-10-CM | POA: Diagnosis not present

## 2022-09-11 DIAGNOSIS — L03116 Cellulitis of left lower limb: Secondary | ICD-10-CM | POA: Diagnosis present

## 2022-09-11 DIAGNOSIS — Z1611 Resistance to penicillins: Secondary | ICD-10-CM | POA: Diagnosis present

## 2022-09-11 DIAGNOSIS — E041 Nontoxic single thyroid nodule: Secondary | ICD-10-CM | POA: Diagnosis present

## 2022-09-11 DIAGNOSIS — G928 Other toxic encephalopathy: Secondary | ICD-10-CM | POA: Diagnosis present

## 2022-09-11 DIAGNOSIS — L03115 Cellulitis of right lower limb: Secondary | ICD-10-CM | POA: Diagnosis present

## 2022-09-11 DIAGNOSIS — I10 Essential (primary) hypertension: Secondary | ICD-10-CM | POA: Diagnosis not present

## 2022-09-11 DIAGNOSIS — Z79899 Other long term (current) drug therapy: Secondary | ICD-10-CM

## 2022-09-11 DIAGNOSIS — H9190 Unspecified hearing loss, unspecified ear: Secondary | ICD-10-CM | POA: Diagnosis present

## 2022-09-11 DIAGNOSIS — R197 Diarrhea, unspecified: Secondary | ICD-10-CM | POA: Diagnosis not present

## 2022-09-11 DIAGNOSIS — N3000 Acute cystitis without hematuria: Secondary | ICD-10-CM | POA: Diagnosis not present

## 2022-09-11 DIAGNOSIS — R4189 Other symptoms and signs involving cognitive functions and awareness: Secondary | ICD-10-CM | POA: Diagnosis present

## 2022-09-11 DIAGNOSIS — Z809 Family history of malignant neoplasm, unspecified: Secondary | ICD-10-CM

## 2022-09-11 DIAGNOSIS — Z96653 Presence of artificial knee joint, bilateral: Secondary | ICD-10-CM | POA: Diagnosis not present

## 2022-09-11 DIAGNOSIS — E876 Hypokalemia: Secondary | ICD-10-CM | POA: Diagnosis not present

## 2022-09-11 DIAGNOSIS — I878 Other specified disorders of veins: Secondary | ICD-10-CM | POA: Diagnosis present

## 2022-09-11 DIAGNOSIS — F1020 Alcohol dependence, uncomplicated: Secondary | ICD-10-CM | POA: Diagnosis present

## 2022-09-11 DIAGNOSIS — R0902 Hypoxemia: Secondary | ICD-10-CM | POA: Diagnosis not present

## 2022-09-11 DIAGNOSIS — Z881 Allergy status to other antibiotic agents status: Secondary | ICD-10-CM

## 2022-09-11 DIAGNOSIS — Z602 Problems related to living alone: Secondary | ICD-10-CM | POA: Diagnosis not present

## 2022-09-11 DIAGNOSIS — E860 Dehydration: Secondary | ICD-10-CM | POA: Diagnosis not present

## 2022-09-11 LAB — CBC WITH DIFFERENTIAL/PLATELET
Abs Immature Granulocytes: 0.04 10*3/uL (ref 0.00–0.07)
Basophils Absolute: 0.1 10*3/uL (ref 0.0–0.1)
Basophils Relative: 1 %
Eosinophils Absolute: 0.1 10*3/uL (ref 0.0–0.5)
Eosinophils Relative: 1 %
HCT: 45.6 % (ref 36.0–46.0)
Hemoglobin: 16.1 g/dL — ABNORMAL HIGH (ref 12.0–15.0)
Immature Granulocytes: 1 %
Lymphocytes Relative: 17 %
Lymphs Abs: 1.4 10*3/uL (ref 0.7–4.0)
MCH: 29.8 pg (ref 26.0–34.0)
MCHC: 35.3 g/dL (ref 30.0–36.0)
MCV: 84.4 fL (ref 80.0–100.0)
Monocytes Absolute: 0.8 10*3/uL (ref 0.1–1.0)
Monocytes Relative: 10 %
Neutro Abs: 6.1 10*3/uL (ref 1.7–7.7)
Neutrophils Relative %: 70 %
Platelets: 198 10*3/uL (ref 150–400)
RBC: 5.4 MIL/uL — ABNORMAL HIGH (ref 3.87–5.11)
RDW: 13.6 % (ref 11.5–15.5)
WBC: 8.4 10*3/uL (ref 4.0–10.5)
nRBC: 0 % (ref 0.0–0.2)

## 2022-09-11 LAB — COMPREHENSIVE METABOLIC PANEL
ALT: 15 U/L (ref 0–44)
AST: 24 U/L (ref 15–41)
Albumin: 3.3 g/dL — ABNORMAL LOW (ref 3.5–5.0)
Alkaline Phosphatase: 87 U/L (ref 38–126)
Anion gap: 10 (ref 5–15)
BUN: 8 mg/dL (ref 8–23)
CO2: 36 mmol/L — ABNORMAL HIGH (ref 22–32)
Calcium: 8.3 mg/dL — ABNORMAL LOW (ref 8.9–10.3)
Chloride: 88 mmol/L — ABNORMAL LOW (ref 98–111)
Creatinine, Ser: 0.73 mg/dL (ref 0.44–1.00)
GFR, Estimated: >60 mL/min (ref >60)
Glucose, Bld: 137 mg/dL — ABNORMAL HIGH (ref 70–99)
Potassium: <2 mmol/L — CL (ref 3.5–5.1)
Sodium: 134 mmol/L — ABNORMAL LOW (ref 135–145)
Total Bilirubin: 1.6 mg/dL — ABNORMAL HIGH (ref 0.3–1.2)
Total Protein: 6.7 g/dL (ref 6.5–8.1)

## 2022-09-11 LAB — TROPONIN I (HIGH SENSITIVITY)
Troponin I (High Sensitivity): 9 ng/L (ref <18)
Troponin I (High Sensitivity): 10 ng/L (ref <18)

## 2022-09-11 LAB — LIPASE, BLOOD: Lipase: 32 U/L (ref 11–51)

## 2022-09-11 LAB — MAGNESIUM: Magnesium: 1.8 mg/dL (ref 1.7–2.4)

## 2022-09-11 LAB — ETHANOL: Alcohol, Ethyl (B): <10 mg/dL (ref <10)

## 2022-09-11 MED ORDER — POTASSIUM CHLORIDE CRYS ER 20 MEQ PO TBCR
40.0000 meq | EXTENDED_RELEASE_TABLET | Freq: Once | ORAL | Status: AC
  Administered 2022-09-11: 40 meq via ORAL
  Filled 2022-09-11: qty 2

## 2022-09-11 MED ORDER — SODIUM CHLORIDE 0.9 % IV BOLUS
500.0000 mL | Freq: Once | INTRAVENOUS | Status: AC
  Administered 2022-09-11: 500 mL via INTRAVENOUS

## 2022-09-11 MED ORDER — LACTATED RINGERS IV SOLN: INTRAVENOUS | Status: DC

## 2022-09-11 MED ORDER — POTASSIUM CHLORIDE 10 MEQ/100ML IV SOLN
10.0000 meq | INTRAVENOUS | Status: AC
  Administered 2022-09-11 – 2022-09-12 (×4): 10 meq via INTRAVENOUS
  Filled 2022-09-11 (×4): qty 100

## 2022-09-11 MED ORDER — MELATONIN 3 MG PO TABS
3.0000 mg | ORAL_TABLET | Freq: Every evening | ORAL | Status: DC | PRN
  Administered 2022-09-12: 3 mg via ORAL
  Filled 2022-09-11: qty 1

## 2022-09-11 MED ORDER — ONDANSETRON HCL 4 MG/2ML IJ SOLN
4.0000 mg | Freq: Four times a day (QID) | INTRAMUSCULAR | Status: DC | PRN
  Administered 2022-09-11: 4 mg via INTRAVENOUS
  Filled 2022-09-11 (×2): qty 2

## 2022-09-11 MED ORDER — ACETAMINOPHEN 500 MG PO TABS
500.0000 mg | ORAL_TABLET | Freq: Four times a day (QID) | ORAL | Status: DC | PRN
  Administered 2022-09-12 – 2022-09-22 (×3): 500 mg via ORAL
  Filled 2022-09-11 (×4): qty 1

## 2022-09-11 MED ORDER — ACETAMINOPHEN 650 MG RE SUPP: 650.0000 mg | Freq: Four times a day (QID) | RECTAL | Status: DC | PRN

## 2022-09-11 MED ORDER — ONDANSETRON HCL 4 MG PO TABS
4.0000 mg | ORAL_TABLET | Freq: Four times a day (QID) | ORAL | Status: DC | PRN
  Administered 2022-09-21: 4 mg via ORAL
  Filled 2022-09-11: qty 1

## 2022-09-11 MED ORDER — SORBITOL 70 % SOLN: 30.0000 mL | Freq: Every day | Status: DC | PRN

## 2022-09-11 NOTE — ED Triage Notes (Signed)
Pt Arrived via EMS from [facility], EMS was called by staffed to do a welfare check. Pt was found in bed weak and solid. Home was not well kept, pt declines pain n/v some dizziness and stated she has not eaten or drank much fluid. Staff is concerned of possible depressive state.  AAOx4 20 L AC 4 zofran Some dizziness 120 HR prior to fluid... 0.9 given 90HR 152/70 114 cbg 20RR 98%

## 2022-09-11 NOTE — ED Provider Notes (Signed)
Emergency Department Provider Note   I have reviewed the triage vital signs and the nursing notes.   HISTORY  Chief Complaint Weakness   HPI Denise Kelly is a 82 y.o. female with PMH reviewed below presents to the emergency department from independent living at [facility] for evaluation after she was found weak and in her room covered in diarrhea.  Staff say they had not seen her for 2 days and went to check on her.  They found that she had diarrhea and was able to only partially clean herself.  She had not been eating and drinking very little.  They called EMS and the patient apparently reported some lightheadedness when transferring to the stretcher.  On my assessment, patient denies any severe weakness at this time.  No chest pain, shortness of breath, abdominal pain.  No severe headaches or falls.  She states she did have 2 days of nonbloody diarrhea but that is resolved at this point.    Past Medical History:  Diagnosis Date   Alcohol abuse    ETOH and xanax   Anxiety    Panic attacks    Arthritis    Cancer (HCC)    skin- basal , squamous , melonoma- right hand   Cellulitis    right leg   Complication of anesthesia 2009   "felt drunk for a week after"- AVM- both times- felt drunk   Depression    Encephalopathy    GERD (gastroesophageal reflux disease)    HOH (hard of hearing)    HOH (hard of hearing)    Hypertension    not on mediacations   Palpitations    PONV (postoperative nausea and vomiting)    Spondylolisthesis of lumbosacral region    UTI (urinary tract infection)    frequently    Review of Systems  Constitutional: No fever/chills. Positive weakness.  Cardiovascular: Denies chest pain. Respiratory: Denies shortness of breath. Gastrointestinal: No abdominal pain.   Genitourinary: Negative for dysuria. Musculoskeletal: Negative for back pain. Skin: Negative for rash. Neurological: Negative for headaches, focal weakness or  numbness.   ____________________________________________   PHYSICAL EXAM:  VITAL SIGNS: ED Triage Vitals  Enc Vitals Group     BP 09/11/22 1944 137/76     Pulse Rate 09/11/22 1944 94     Resp 09/11/22 1944 18     Temp 09/11/22 1944 (!) 97.4 F (36.3 C)     Temp Source 09/11/22 1944 Oral     SpO2 09/11/22 1944 96 %     Weight 09/11/22 1944 200 lb (90.7 kg)     Height 09/11/22 1944 5\' 7"  (1.702 m)   Constitutional: Alert and oriented. Well appearing and in no acute distress. Eyes: Conjunctivae are normal.  Head: Atraumatic. Nose: No congestion/rhinnorhea. Mouth/Throat: Mucous membranes are moist.   Neck: No stridor.   Cardiovascular: Normal rate, regular rhythm. Good peripheral circulation. Grossly normal heart sounds.   Respiratory: Normal respiratory effort.  No retractions. Lungs CTAB. Gastrointestinal: Soft and nontender. No distention.  Musculoskeletal: No lower extremity tenderness nor edema. No gross deformities of extremities. Neurologic:  Normal speech and language. No gross focal neurologic deficits are appreciated.  Skin:  Skin is warm, dry and intact. No rash noted.  ____________________________________________   LABS (all labs ordered are listed, but only abnormal results are displayed)  Labs Reviewed  COMPREHENSIVE METABOLIC PANEL - Abnormal; Notable for the following components:      Result Value   Sodium 134 (*)  Potassium <2.0 (*)    Chloride 88 (*)    CO2 36 (*)    Glucose, Bld 137 (*)    Calcium 8.3 (*)    Albumin 3.3 (*)    Total Bilirubin 1.6 (*)    All other components within normal limits  CBC WITH DIFFERENTIAL/PLATELET - Abnormal; Notable for the following components:   RBC 5.40 (*)    Hemoglobin 16.1 (*)    All other components within normal limits  URINALYSIS, ROUTINE W REFLEX MICROSCOPIC - Abnormal; Notable for the following components:   APPearance HAZY (*)    Specific Gravity, Urine 1.003 (*)    Hgb urine dipstick MODERATE (*)     Nitrite POSITIVE (*)    Leukocytes,Ua LARGE (*)    Bacteria, UA MANY (*)    All other components within normal limits  BASIC METABOLIC PANEL - Abnormal; Notable for the following components:   Potassium <2.0 (*)    Chloride 97 (*)    Glucose, Bld 118 (*)    BUN 6 (*)    Calcium 8.2 (*)    All other components within normal limits  CBC - Abnormal; Notable for the following components:   RBC 5.17 (*)    Hemoglobin 15.1 (*)    All other components within normal limits  BASIC METABOLIC PANEL - Abnormal; Notable for the following components:   Potassium 2.2 (*)    Chloride 94 (*)    Glucose, Bld 183 (*)    BUN <5 (*)    Calcium 7.6 (*)    All other components within normal limits  BASIC METABOLIC PANEL - Abnormal; Notable for the following components:   Potassium 2.2 (*)    Glucose, Bld 110 (*)    BUN <5 (*)    Calcium 8.1 (*)    All other components within normal limits  OSMOLALITY - Abnormal; Notable for the following components:   Osmolality 296 (*)    All other components within normal limits  URINE CULTURE  LIPASE, BLOOD  ETHANOL  MAGNESIUM  MAGNESIUM  NA AND K (SODIUM & POTASSIUM), RAND UR  OSMOLALITY, URINE  BASIC METABOLIC PANEL  BASIC METABOLIC PANEL  TROPONIN I (HIGH SENSITIVITY)  TROPONIN I (HIGH SENSITIVITY)   ____________________________________________  EKG   EKG Interpretation Date/Time:  Thursday September 11 2022 20:30:25 EDT Ventricular Rate:  89 PR Interval:  182 QRS Duration:  125 QT Interval:  395 QTC Calculation: 481 R Axis:   -50  Text Interpretation: Sinus rhythm Multiple premature complexes, vent & supraven RBBB and LAFB Similar to March 2024 tracing Confirmed by Alona Bene [phone]) on 09/11/2022 8:32:10 PM        ____________________________________________   PROCEDURES  Procedure(s) performed:   Procedures  CRITICAL CARE Performed by: Maia Plan Total critical care time: 35 minutes Critical care time was exclusive of  separately billable procedures and treating other patients. Critical care was necessary to treat or prevent imminent or life-threatening deterioration. Critical care was time spent personally by me on the following activities: development of treatment plan with patient and/or surrogate as well as nursing, discussions with consultants, evaluation of patient's response to treatment, examination of patient, obtaining history from patient or surrogate, ordering and performing treatments and interventions, ordering and review of laboratory studies, ordering and review of radiographic studies, pulse oximetry and re-evaluation of patient's condition.  Alona Bene, MD Emergency Medicine  ____________________________________________   INITIAL IMPRESSION / ASSESSMENT AND PLAN / ED COURSE  Pertinent labs & imaging results  that were available during my care of the patient were reviewed by me and considered in my medical decision making (see chart for details).   This patient is Presenting for Evaluation of weakness, which does require a range of treatment options, and is a complaint that involves a high risk of morbidity and mortality.  The Differential Diagnoses includes but is not exclusive to alcohol, illicit or prescription medications, intracranial pathology such as stroke, intracerebral hemorrhage, fever or infectious causes including sepsis, hypoxemia, uremia, trauma, endocrine related disorders such as diabetes, hypoglycemia, thyroid-related diseases, etc.   Critical Interventions-    Medications  acetaminophen (TYLENOL) tablet 500 mg (500 mg Oral Given 09/12/22 1709)    Or  acetaminophen (TYLENOL) suppository 650 mg ( Rectal See Alternative 09/12/22 1709)  ondansetron (ZOFRAN) tablet 4 mg ( Oral See Alternative 09/11/22 2306)    Or  ondansetron (ZOFRAN) injection 4 mg (4 mg Intravenous Given 09/11/22 2306)  sorbitol 70 % solution 30 mL (has no administration in time range)  melatonin tablet 3  mg (3 mg Oral Given 09/12/22 0053)  acetaminophen (TYLENOL) tablet 650 mg (650 mg Oral Given 09/12/22 0909)  traZODone (DESYREL) tablet 100 mg (has no administration in time range)  apixaban (ELIQUIS) tablet 5 mg (5 mg Oral Given 09/12/22 0910)  cyanocobalamin (VITAMIN B12) tablet 1,000 mcg (1,000 mcg Oral Given 09/12/22 0910)  gabapentin (NEURONTIN) capsule 100 mg (100 mg Oral Given 09/12/22 1711)  thiamine (VITAMIN B1) tablet 100 mg (100 mg Oral Given 09/12/22 0910)    Or  thiamine (VITAMIN B1) injection 100 mg ( Intravenous See Alternative 09/12/22 0910)  folic acid (FOLVITE) tablet 1 mg (1 mg Oral Given 09/12/22 0909)  multivitamin with minerals tablet 1 tablet (1 tablet Oral Given 09/12/22 0909)  potassium chloride 10 mEq in 100 mL IVPB (10 mEq Intravenous New Bag/Given 09/12/22 1811)  cefadroxil (DURICEF) capsule 500 mg (500 mg Oral Given 09/12/22 0943)  lactated ringers 1,000 mL with potassium chloride 40 mEq infusion ( Intravenous New Bag/Given 09/12/22 1705)  potassium chloride 10 mEq in 100 mL IVPB (has no administration in time range)  sodium chloride 0.9 % bolus 500 mL (0 mLs Intravenous Stopped 09/11/22 2306)  potassium chloride 10 mEq in 100 mL IVPB (0 mEq Intravenous Stopped 09/12/22 0738)  potassium chloride SA (KLOR-CON M) CR tablet 40 mEq (40 mEq Oral Given 09/11/22 2243)  sterile water (preservative free) injection (  Given 09/12/22 0143)  potassium chloride SA (KLOR-CON M) CR tablet 40 mEq (40 mEq Oral Given 09/12/22 1709)  magnesium sulfate IVPB 2 g 50 mL (2 g Intravenous New Bag/Given 09/12/22 0830)    Reassessment after intervention: symptoms improved.    I did obtain Additional Historical Information from EMS.   I decided to review pertinent External Data, and in summary had an admit in March of this year with weakness. Medications changed for chronic pain and symptoms improved.    Clinical Laboratory Tests Ordered, included CMP with severe hypokalemia. No AKI. UA pending.    Radiologic Tests: Considered abdominal imaging but diarrhea has resolved and non-tender.   Social Determinants of Health Risk patient is a non-smoker.   Consult complete with [facility]. Plan for admit.   Medical Decision Making: Summary:  Patient presents emergency department for evaluation of generalized weakness in the setting of diarrhea for 2 days.  She apparently was not coming out of her room and staff is concerned she may not have been eating or drinking.  Reevaluation with update and discussion  with patient. Discussed low K. Plan for replacement and admit. No ectopy on telemetry.   Patient's presentation is most consistent with acute presentation with potential threat to life or bodily function.   Disposition: admit  ____________________________________________  FINAL CLINICAL IMPRESSION(S) / ED DIAGNOSES  Final diagnoses:  Hypokalemia  Weakness     Note:  This document was prepared using Dragon voice recognition software and may include unintentional dictation errors.  Alona Bene, MD, [facility] Emergency Medicine    Sofiya Ezelle, Arlyss Repress, MD 09/12/22 [phone]

## 2022-09-11 NOTE — ED Notes (Signed)
Pt came in soiled with dry BM from waist down. Bedside cleaning down by myself RN. Pt continues to push against staff and was very uncooperative with care. Patient reassured multiple times that she was safe and about the importance of getting the dirty sheets and briefs away from patients skin. Patient expressed understanding but still uncooperative with care. Patient asked staff member to stop cleaning her due to her anxiety. Once again patient educated on the importance of cleaning. Patient would not fully allow stuff to roll her in bed to be able to access back and buttock to make sure all BM off of patient at this time. Soiled sheets and brief removed patient at this time. Barrier cream applied to red areas of body and new brief applied. Patient would not allow staff to change her clothes from home.

## 2022-09-11 NOTE — H&P (Addendum)
History and Physical    Patient: Denise Kelly ZOX:[id number] DOB: [birth year] DOA: 09/11/2022 DOS: the patient was seen and examined on 09/11/2022 PCP: Pcp, No  Patient coming from: Home  Chief Complaint:  Chief Complaint  Patient presents with   Weakness   HPI: Denise Kelly is a 82 y.o. female with medical history significant for history of PE on Eliquis, lymphedema and chronic lower extremity pain who was found in her room covered in diarrhea.  The patient lives in an independent living facility facility and staff went to check on her because she had not been seen in a couple of days.  The patient reports that she had terrible diarrhea for approximately 3 days.  But the diarrhea has now resolved.  She has been diarrhea free for the last 2 days and she denies any pain or fever.  Her appetite is at baseline and she has been eating.  She denies any fevers or chest pain. The patient was brought into the emergency department by EMS.  Her workup revealed severe hypokalemia with potassium less than 2.   Review of Systems: As mentioned in the history of present illness. All other systems reviewed and are negative. Past Medical History:  Diagnosis Date   Alcohol abuse    ETOH and xanax   Anxiety    Panic attacks    Arthritis    Cancer (HCC)    skin- basal , squamous , melonoma- right hand   Cellulitis    right leg   Complication of anesthesia 2009   "felt drunk for a week after"- AVM- both times- felt drunk   Depression    Encephalopathy    GERD (gastroesophageal reflux disease)    HOH (hard of hearing)    HOH (hard of hearing)    Hypertension    not on mediacations   Palpitations    PONV (postoperative nausea and vomiting)    Spondylolisthesis of lumbosacral region    UTI (urinary tract infection)    frequently   Past Surgical History:  Procedure Laterality Date   BIOPSY  01/24/2018   Procedure: BIOPSY;  Surgeon: Kerin Salen, MD;  Location: [facility] ENDOSCOPY;  Service:  Gastroenterology;;   BREAST SURGERY Right    2 breast biopsies   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   COLONOSCOPY  2011   polyps   ESOPHAGOGASTRODUODENOSCOPY (EGD) WITH PROPOFOL N/A 01/24/2018   Procedure: ESOPHAGOGASTRODUODENOSCOPY (EGD) WITH PROPOFOL;  Surgeon: Kerin Salen, MD;  Location: [facility] ENDOSCOPY;  Service: Gastroenterology;  Laterality: N/A;   EYE SURGERY Bilateral    cataracts   INTRAMEDULLARY (IM) NAIL INTERTROCHANTERIC Left 11/29/2015   Procedure: LEFT HIP   NAIL;  Surgeon: Sheral Apley, MD;  Location: [facility] OR;  Service: Orthopedics;  Laterality: Left;   JOINT REPLACEMENT Left 09/2009   knee   LUMBAR FUSION  07/17/2016   Lumbar four-five Posterior lumbar interbody fusion (N/A)   TONSILLECTOMY     TOTAL KNEE ARTHROPLASTY Right 01/02/2014   Procedure: TOTAL KNEE ARTHROPLASTY;  Surgeon: Dannielle Huh, MD;  Location: [facility] OR;  Service: Orthopedics;  Laterality: Right;   TUBAL LIGATION     Social History:  reports that she quit smoking about 30 years ago. She has a 20.00 pack-year smoking history. She has never used smokeless tobacco. She reports that she does not drink alcohol and does not use drugs.  Allergies  Allergen Reactions   Sulfa Antibiotics Hives, Swelling and Other (See Comments)    Facial/eye  swelling    Coreg [Carvedilol] Other (See Comments)    Memory loss   Motrin [Ibuprofen] Palpitations   Toprol Xl [Metoprolol Tartrate] Cough   Doxycycline Hyclate Other (See Comments)    HEARTBURN   Flovent Hfa [Fluticasone] Itching   Statins Other (See Comments)    MENTAL STATUS CHANGE    Family History  Problem Relation Age of Onset   Hypertension Other    Cancer Mother    Cancer Father     Prior to Admission medications   Medication Sig Start Date End Date Taking? Authorizing Provider  acetaminophen (TYLENOL) 325 MG tablet Take 2 tablets (650 mg total) by mouth 2 (two) times daily. 05/24/22   Shalhoub, Deno Lunger, MD  apixaban (ELIQUIS) 5 MG TABS tablet  Take 5 mg by mouth 2 (two) times daily.    [provider]  cyanocobalamin 1000 MCG tablet Take 1 tablet (1,000 mcg total) by mouth daily. 05/03/22   Albertine Grates, MD  DULoxetine (CYMBALTA) 30 MG capsule Take 1 capsule (30 mg total) by mouth daily. 05/25/22 09/03/22  Shalhoub, Deno Lunger, MD  furosemide (LASIX) 40 MG tablet Take 1 tablet (40 mg total) by mouth 2 (two) times daily. 05/24/22   Shalhoub, Deno Lunger, MD  gabapentin (NEURONTIN) 100 MG capsule Take 1 capsule (100 mg total) by mouth 3 (three) times daily. 05/24/22   Shalhoub, Deno Lunger, MD  melatonin 3 MG TABS tablet Take 1 tablet (3 mg total) by mouth at bedtime. 05/02/22   Albertine Grates, MD  Multiple Vitamin (MULTIVITAMIN WITH MINERALS) TABS tablet Take 1 tablet by mouth daily. 04/23/22   Pokhrel, Rebekah Chesterfield, MD  ondansetron (ZOFRAN) 4 MG tablet Take 4 mg by mouth every 8 (eight) hours as needed for nausea or vomiting.    [provider]  pantoprazole (PROTONIX) 40 MG tablet Take 1 tablet (40 mg total) by mouth daily. 05/03/22   Albertine Grates, MD  polyethylene glycol (MIRALAX / GLYCOLAX) 17 g packet Take 17 g by mouth daily. Hold if diarrhea 05/03/22   Albertine Grates, MD  Potassium Chloride ER 20 MEQ TBCR Take 1 tablet (20 mEq total) by mouth in the morning and at bedtime. 05/24/22   Shalhoub, Deno Lunger, MD  pramipexole (MIRAPEX) 0.125 MG tablet Take 0.125 mg by mouth daily. 04/15/22   [provider]  senna-docusate (SENOKOT-S) 8.6-50 MG tablet Take 2 tablets by mouth at bedtime. 05/24/22   Shalhoub, Deno Lunger, MD  traMADol (ULTRAM) 50 MG tablet Take 50 mg by mouth every 6 (six) hours as needed for moderate pain.    [provider]  traZODone (DESYREL) 100 MG tablet Take 1 tablet (100 mg total) by mouth at bedtime as needed for sleep. 05/02/22   Albertine Grates, MD    Physical Exam: Vitals:   09/11/22 1944 09/11/22 1952  BP: 137/76   Pulse: 94   Resp: 18   Temp: (!) 97.4 F (36.3 C)   TempSrc: Oral   SpO2: 96% 98%  Weight: 90.7 kg   Height: 5'  7" (1.702 m)    Physical Exam:  General: No acute distress, well developed, well nourished HEENT: Normocephalic, atraumatic, PER Cardiovascular: Normal rate and rhythm. Distal pulses intact. Pulmonary: Normal pulmonary effort, normal breath sounds Gastrointestinal: Nondistended abdomen, soft, non-tender, normoactive bowel sounds, no organomegaly Musculoskeletal:Normal ROM, non pitting edema Skin: Skin is warm and dry. Increased freckles and hyperpigmentation in areas Neuro: No focal deficits noted, AAOx3. PSYCH: Attentive and cooperative  Data Reviewed:  Results for orders  placed or performed during the hospital encounter of 09/11/22 (from the past 24 hour(s))  Comprehensive metabolic panel     Status: Abnormal   Collection Time: 09/11/22  8:22 PM  Result Value Ref Range   Sodium 134 (L) 135 - 145 mmol/L   Potassium <2.0 (LL) 3.5 - 5.1 mmol/L   Chloride 88 (L) 98 - 111 mmol/L   CO2 36 (H) 22 - 32 mmol/L   Glucose, Bld 137 (H) 70 - 99 mg/dL   BUN 8 8 - 23 mg/dL   Creatinine, Ser 1.61 0.44 - 1.00 mg/dL   Calcium 8.3 (L) 8.9 - 10.3 mg/dL   Total Protein 6.7 6.5 - 8.1 g/dL   Albumin 3.3 (L) 3.5 - 5.0 g/dL   AST 24 15 - 41 U/L   ALT 15 0 - 44 U/L   Alkaline Phosphatase 87 38 - 126 U/L   Total Bilirubin 1.6 (H) 0.3 - 1.2 mg/dL   GFR, Estimated >09 >60 mL/min   Anion gap 10 5 - 15  Lipase, blood     Status: None   Collection Time: 09/11/22  8:22 PM  Result Value Ref Range   Lipase 32 11 - 51 U/L  Troponin I (High Sensitivity)     Status: None   Collection Time: 09/11/22  8:22 PM  Result Value Ref Range   Troponin I (High Sensitivity) 9 <18 ng/L  CBC with Differential     Status: Abnormal   Collection Time: 09/11/22  8:22 PM  Result Value Ref Range   WBC 8.4 4.0 - 10.5 K/uL   RBC 5.40 (H) 3.87 - 5.11 MIL/uL   Hemoglobin 16.1 (H) 12.0 - 15.0 g/dL   HCT 45.4 09.8 - 11.9 %   MCV 84.4 80.0 - 100.0 fL   MCH 29.8 26.0 - 34.0 pg   MCHC 35.3 30.0 - 36.0 g/dL   RDW 14.7 82.9 -  56.2 %   Platelets 198 150 - 400 K/uL   nRBC 0.0 0.0 - 0.2 %   Neutrophils Relative % 70 %   Neutro Abs 6.1 1.7 - 7.7 K/uL   Lymphocytes Relative 17 %   Lymphs Abs 1.4 0.7 - 4.0 K/uL   Monocytes Relative 10 %   Monocytes Absolute 0.8 0.1 - 1.0 K/uL   Eosinophils Relative 1 %   Eosinophils Absolute 0.1 0.0 - 0.5 K/uL   Basophils Relative 1 %   Basophils Absolute 0.1 0.0 - 0.1 K/uL   Immature Granulocytes 1 %   Abs Immature Granulocytes 0.04 0.00 - 0.07 K/uL  Ethanol     Status: None   Collection Time: 09/11/22  8:22 PM  Result Value Ref Range   Alcohol, Ethyl (B) <10 <10 mg/dL     Assessment and Plan: Hypokalemia - Likely due to diarrhea.  She is having a little nausea so her replacement will be a combination of IV and p.o. as tolerated.  2. H/o PE = Continue Eliquis  3. Lymphedema with chronic leg pain - continue her regimen including scheduled Tylenol, gabapentin, and Cymbalta with prn Ultram.  4. H/o Alcohol abuse in past - Monitor CIWA   Advance Care Planning:   Code Status: Full Code  The patient names Hope or Theodoro Grist as her surrogate decision makers.  She says "not my nephew".  She is unsure about the CODE STATUS question so she will remain full code for now.  Consults: none  Family Communication: none  Severity of Illness: The appropriate patient status for  this patient is INPATIENT. Inpatient status is judged to be reasonable and necessary in order to provide the required intensity of service to ensure the patient's safety. The patient's presenting symptoms, physical exam findings, and initial radiographic and laboratory data in the context of their chronic comorbidities is felt to place them at high risk for further clinical deterioration. Furthermore, it is not anticipated that the patient will be medically stable for discharge from the hospital within 2 midnights of admission.   * I certify that at the point of admission it is my clinical judgment that the  patient will require inpatient hospital care spanning beyond 2 midnights from the point of admission due to high intensity of service, high risk for further deterioration and high frequency of surveillance required.*  Author: Buena Irish, MD 09/11/2022 11:07 PM  For on call review [email or web address].

## 2022-09-12 ENCOUNTER — Other Ambulatory Visit: Payer: Self-pay

## 2022-09-12 DIAGNOSIS — E876 Hypokalemia: Secondary | ICD-10-CM

## 2022-09-12 LAB — CBC
HCT: 44.3 % (ref 36.0–46.0)
Hemoglobin: 15.1 g/dL — ABNORMAL HIGH (ref 12.0–15.0)
MCH: 29.2 pg (ref 26.0–34.0)
MCHC: 34.1 g/dL (ref 30.0–36.0)
MCV: 85.7 fL (ref 80.0–100.0)
Platelets: 177 10*3/uL (ref 150–400)
RBC: 5.17 MIL/uL — ABNORMAL HIGH (ref 3.87–5.11)
RDW: 13.7 % (ref 11.5–15.5)
WBC: 6.7 10*3/uL (ref 4.0–10.5)
nRBC: 0 % (ref 0.0–0.2)

## 2022-09-12 LAB — URINALYSIS, ROUTINE W REFLEX MICROSCOPIC
Bilirubin Urine: NEGATIVE
Glucose, UA: NEGATIVE mg/dL
Ketones, ur: NEGATIVE mg/dL
Nitrite: POSITIVE — AB
Protein, ur: NEGATIVE mg/dL
Specific Gravity, Urine: 1.003 — ABNORMAL LOW (ref 1.005–1.030)
WBC, UA: >50 WBC/hpf (ref 0–5)
pH: 7 (ref 5.0–8.0)

## 2022-09-12 LAB — BASIC METABOLIC PANEL
Anion gap: 9 (ref 5–15)
Anion gap: 11 (ref 5–15)
Anion gap: 7 (ref 5–15)
BUN: 6 mg/dL — ABNORMAL LOW (ref 8–23)
BUN: <5 mg/dL — ABNORMAL LOW (ref 8–23)
BUN: <5 mg/dL — ABNORMAL LOW (ref 8–23)
CO2: 32 mmol/L (ref 22–32)
CO2: 30 mmol/L (ref 22–32)
CO2: 32 mmol/L (ref 22–32)
Calcium: 8.2 mg/dL — ABNORMAL LOW (ref 8.9–10.3)
Calcium: 7.6 mg/dL — ABNORMAL LOW (ref 8.9–10.3)
Calcium: 8.1 mg/dL — ABNORMAL LOW (ref 8.9–10.3)
Chloride: 97 mmol/L — ABNORMAL LOW (ref 98–111)
Chloride: 94 mmol/L — ABNORMAL LOW (ref 98–111)
Chloride: 103 mmol/L (ref 98–111)
Creatinine, Ser: 0.62 mg/dL (ref 0.44–1.00)
Creatinine, Ser: 0.63 mg/dL (ref 0.44–1.00)
Creatinine, Ser: 0.62 mg/dL (ref 0.44–1.00)
GFR, Estimated: >60 mL/min (ref >60)
GFR, Estimated: >60 mL/min (ref >60)
GFR, Estimated: >60 mL/min (ref >60)
Glucose, Bld: 118 mg/dL — ABNORMAL HIGH (ref 70–99)
Glucose, Bld: 183 mg/dL — ABNORMAL HIGH (ref 70–99)
Glucose, Bld: 110 mg/dL — ABNORMAL HIGH (ref 70–99)
Potassium: <2 mmol/L — CL (ref 3.5–5.1)
Potassium: 2.2 mmol/L — CL (ref 3.5–5.1)
Potassium: 2.2 mmol/L — CL (ref 3.5–5.1)
Sodium: 138 mmol/L (ref 135–145)
Sodium: 135 mmol/L (ref 135–145)
Sodium: 142 mmol/L (ref 135–145)

## 2022-09-12 LAB — NA AND K (SODIUM & POTASSIUM), RAND UR
Potassium Urine: 6 mmol/L
Sodium, Ur: 16 mmol/L

## 2022-09-12 LAB — OSMOLALITY: Osmolality: 296 mOsm/kg — ABNORMAL HIGH (ref 275–295)

## 2022-09-12 LAB — MAGNESIUM: Magnesium: 1.7 mg/dL (ref 1.7–2.4)

## 2022-09-12 MED ORDER — CEFADROXIL 500 MG PO CAPS
500.0000 mg | ORAL_CAPSULE | Freq: Two times a day (BID) | ORAL | Status: DC
  Administered 2022-09-12 – 2022-09-17 (×10): 500 mg via ORAL
  Filled 2022-09-12 (×12): qty 1

## 2022-09-12 MED ORDER — ADULT MULTIVITAMIN W/MINERALS CH
1.0000 | ORAL_TABLET | Freq: Every day | ORAL | Status: DC
  Administered 2022-09-12 – 2022-09-17 (×6): 1 via ORAL
  Filled 2022-09-12 (×6): qty 1

## 2022-09-12 MED ORDER — ACETAMINOPHEN 325 MG PO TABS
650.0000 mg | ORAL_TABLET | Freq: Two times a day (BID) | ORAL | Status: DC
  Administered 2022-09-12 – 2022-09-24 (×25): 650 mg via ORAL
  Filled 2022-09-12 (×25): qty 2

## 2022-09-12 MED ORDER — FOLIC ACID 1 MG PO TABS
1.0000 mg | ORAL_TABLET | Freq: Every day | ORAL | Status: DC
  Administered 2022-09-12 – 2022-09-17 (×6): 1 mg via ORAL
  Filled 2022-09-12 (×6): qty 1

## 2022-09-12 MED ORDER — THIAMINE HCL 100 MG/ML IJ SOLN
100.0000 mg | Freq: Every day | INTRAMUSCULAR | Status: DC
  Filled 2022-09-12: qty 2

## 2022-09-12 MED ORDER — POTASSIUM CHLORIDE 2 MEQ/ML IV SOLN
INTRAVENOUS | Status: DC
  Filled 2022-09-12 (×8): qty 1000

## 2022-09-12 MED ORDER — STERILE WATER FOR INJECTION IJ SOLN
INTRAMUSCULAR | Status: AC
  Filled 2022-09-12: qty 10

## 2022-09-12 MED ORDER — POTASSIUM CHLORIDE 10 MEQ/100ML IV SOLN
10.0000 meq | INTRAVENOUS | Status: AC
  Administered 2022-09-12 – 2022-09-13 (×6): 10 meq via INTRAVENOUS
  Filled 2022-09-12 (×6): qty 100

## 2022-09-12 MED ORDER — APIXABAN 5 MG PO TABS
5.0000 mg | ORAL_TABLET | Freq: Two times a day (BID) | ORAL | Status: DC
  Administered 2022-09-12 – 2022-09-24 (×25): 5 mg via ORAL
  Filled 2022-09-12 (×25): qty 1

## 2022-09-12 MED ORDER — GABAPENTIN 100 MG PO CAPS
100.0000 mg | ORAL_CAPSULE | Freq: Three times a day (TID) | ORAL | Status: DC
  Administered 2022-09-12 – 2022-09-24 (×35): 100 mg via ORAL
  Filled 2022-09-12 (×36): qty 1

## 2022-09-12 MED ORDER — THIAMINE MONONITRATE 100 MG PO TABS
100.0000 mg | ORAL_TABLET | Freq: Every day | ORAL | Status: DC
  Administered 2022-09-12 – 2022-09-17 (×6): 100 mg via ORAL
  Filled 2022-09-12 (×6): qty 1

## 2022-09-12 MED ORDER — POTASSIUM CHLORIDE 10 MEQ/100ML IV SOLN
10.0000 meq | INTRAVENOUS | Status: AC
  Administered 2022-09-12 (×5): 10 meq via INTRAVENOUS
  Filled 2022-09-12 (×5): qty 100

## 2022-09-12 MED ORDER — MAGNESIUM SULFATE 2 GM/50ML IV SOLN
2.0000 g | Freq: Once | INTRAVENOUS | Status: AC
  Administered 2022-09-12: 2 g via INTRAVENOUS
  Filled 2022-09-12: qty 50

## 2022-09-12 MED ORDER — POTASSIUM CHLORIDE 2 MEQ/ML IV SOLN: INTRAVENOUS | Status: DC

## 2022-09-12 MED ORDER — POTASSIUM CHLORIDE CRYS ER 20 MEQ PO TBCR
40.0000 meq | EXTENDED_RELEASE_TABLET | ORAL | Status: AC
  Administered 2022-09-12 (×3): 40 meq via ORAL
  Filled 2022-09-12 (×2): qty 2

## 2022-09-12 MED ORDER — VITAMIN B-12 1000 MCG PO TABS
1000.0000 ug | ORAL_TABLET | Freq: Every day | ORAL | Status: DC
  Administered 2022-09-12 – 2022-09-24 (×13): 1000 ug via ORAL
  Filled 2022-09-12 (×13): qty 1

## 2022-09-12 MED ORDER — TRAZODONE HCL 100 MG PO TABS
100.0000 mg | ORAL_TABLET | Freq: Every evening | ORAL | Status: DC | PRN
  Administered 2022-09-14 – 2022-09-22 (×8): 100 mg via ORAL
  Filled 2022-09-12 (×8): qty 1

## 2022-09-12 NOTE — Plan of Care (Signed)
  Problem: Education: Goal: Knowledge of General Education information will improve Description Including pain rating scale, medication(s)/side effects and non-pharmacologic comfort measures Outcome: Progressing   

## 2022-09-12 NOTE — ED Notes (Signed)
ED TO INPATIENT HANDOFF REPORT  ED Nurse Name and Phone #: Dessa Phi  S Name/Age/Gender Denise Kelly 82 y.o. female Room/Bed: WA10/WA10  Code Status   Code Status: Full Code  Home/SNF/Other Skilled nursing facility Patient oriented to: self, place, time, and situation Is this baseline? Yes   Triage Complete: Triage complete  Chief Complaint Hypokalemia [E87.6]  Triage Note Pt Arrived via EMS from [facility], EMS was called by staffed to do a welfare check. Pt was found in bed weak and solid. Home was not well kept, pt declines pain n/v some dizziness and stated she has not eaten or drank much fluid. Staff is concerned of possible depressive state.  AAOx4 20 L AC 4 zofran Some dizziness 120 HR prior to fluid... 0.9 given 90HR 152/70 114 cbg 20RR 98%   Allergies Allergies  Allergen Reactions   Sulfa Antibiotics Hives, Swelling and Other (See Comments)    Facial/eye swelling    Coreg [Carvedilol] Other (See Comments)    Memory loss   Motrin [Ibuprofen] Palpitations   Toprol Xl [Metoprolol Tartrate] Cough   Doxycycline Hyclate Other (See Comments)    HEARTBURN   Flovent Hfa [Fluticasone] Itching   Statins Other (See Comments)    MENTAL STATUS CHANGE    Level of Care/Admitting Diagnosis ED Disposition     ED Disposition  Admit   Condition  --   Comment  Hospital Area: [facility] [[id number]]  Level of Care: Telemetry [5]  Admit to tele based on following criteria: Other see comments  Comments: hypokalemia  May admit patient to [facility] or [facility] if equivalent level of care is available:: Yes  Covid Evaluation: Asymptomatic - no recent exposure (last 10 days) testing not required  Diagnosis: Hypokalemia [161096]  Admitting Physician: Buena Irish [[id number]]  Attending Physician: Buena Irish [[id number]]  Certification:: I certify this patient will need inpatient services for at least 2 midnights  Estimated  Length of Stay: 2          B Medical/Surgery History Past Medical History:  Diagnosis Date   Alcohol abuse    ETOH and xanax   Anxiety    Panic attacks    Arthritis    Cancer (HCC)    skin- basal , squamous , melonoma- right hand   Cellulitis    right leg   Complication of anesthesia 2009   "felt drunk for a week after"- AVM- both times- felt drunk   Depression    Encephalopathy    GERD (gastroesophageal reflux disease)    HOH (hard of hearing)    HOH (hard of hearing)    Hypertension    not on mediacations   Palpitations    PONV (postoperative nausea and vomiting)    Spondylolisthesis of lumbosacral region    UTI (urinary tract infection)    frequently   Past Surgical History:  Procedure Laterality Date   BIOPSY  01/24/2018   Procedure: BIOPSY;  Surgeon: Kerin Salen, MD;  Location: [facility] ENDOSCOPY;  Service: Gastroenterology;;   BREAST SURGERY Right    2 breast biopsies   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   COLONOSCOPY  2011   polyps   ESOPHAGOGASTRODUODENOSCOPY (EGD) WITH PROPOFOL N/A 01/24/2018   Procedure: ESOPHAGOGASTRODUODENOSCOPY (EGD) WITH PROPOFOL;  Surgeon: Kerin Salen, MD;  Location: [facility] ENDOSCOPY;  Service: Gastroenterology;  Laterality: N/A;   EYE SURGERY Bilateral    cataracts   INTRAMEDULLARY (IM) NAIL INTERTROCHANTERIC Left 11/29/2015  Procedure: LEFT HIP   NAIL;  Surgeon: Sheral Apley, MD;  Location: [facility] OR;  Service: Orthopedics;  Laterality: Left;   JOINT REPLACEMENT Left 09/2009   knee   LUMBAR FUSION  07/17/2016   Lumbar four-five Posterior lumbar interbody fusion (N/A)   TONSILLECTOMY     TOTAL KNEE ARTHROPLASTY Right 01/02/2014   Procedure: TOTAL KNEE ARTHROPLASTY;  Surgeon: Dannielle Huh, MD;  Location: [facility] OR;  Service: Orthopedics;  Laterality: Right;   TUBAL LIGATION       A IV Location/Drains/Wounds Patient Lines/Drains/Airways Status     Active Line/Drains/Airways     Name Placement date Placement time Site Days    Peripheral IV 09/11/22 20 G 1" Anterior;Left;Proximal Forearm 09/11/22  1922  Forearm  1   External Urinary Catheter 05/20/22  2259  --  115            Intake/Output Last 24 hours  Intake/Output Summary (Last 24 hours) at 09/12/2022 1058 Last data filed at 09/11/2022 1952 Gross per 24 hour  Intake 500 ml  Output --  Net 500 ml    Labs/Imaging Results for orders placed or performed during the hospital encounter of 09/11/22 (from the past 48 hour(s))  Comprehensive metabolic panel     Status: Abnormal   Collection Time: 09/11/22  8:22 PM  Result Value Ref Range   Sodium 134 (L) 135 - 145 mmol/L   Potassium <2.0 (LL) 3.5 - 5.1 mmol/L    Comment: CRITICAL RESULT CALLED TO, READ BACK BY AND VERIFIED WITH T. HAMPTON, RN 09/11/22 2152 BY K. DAVIS REPEATED TO VERIFY    Chloride 88 (L) 98 - 111 mmol/L   CO2 36 (H) 22 - 32 mmol/L   Glucose, Bld 137 (H) 70 - 99 mg/dL    Comment: Glucose reference range applies only to samples taken after fasting for at least 8 hours.   BUN 8 8 - 23 mg/dL   Creatinine, Ser 1.61 0.44 - 1.00 mg/dL   Calcium 8.3 (L) 8.9 - 10.3 mg/dL   Total Protein 6.7 6.5 - 8.1 g/dL   Albumin 3.3 (L) 3.5 - 5.0 g/dL   AST 24 15 - 41 U/L   ALT 15 0 - 44 U/L   Alkaline Phosphatase 87 38 - 126 U/L   Total Bilirubin 1.6 (H) 0.3 - 1.2 mg/dL   GFR, Estimated >09 >60 mL/min    Comment: (NOTE) Calculated using the CKD-EPI Creatinine Equation (2021)    Anion gap 10 5 - 15    Comment: Performed at [facility], [address], [place], [place] [address]  Lipase, blood     Status: None   Collection Time: 09/11/22  8:22 PM  Result Value Ref Range   Lipase 32 11 - 51 U/L    Comment: Performed at [facility], [address], [place], [place] [address]  Troponin I (High Sensitivity)     Status: None   Collection Time: 09/11/22  8:22 PM  Result Value Ref Range   Troponin I (High Sensitivity) 9 <18 ng/L    Comment: (NOTE) Elevated  high sensitivity troponin I (hsTnI) values and significant  changes across serial measurements may suggest ACS but many other  chronic and acute conditions are known to elevate hsTnI results.  Refer to the "Links" section for chest pain algorithms and additional  guidance. Performed at [facility], [address], [place], [place] [address]   CBC with Differential     Status:  Abnormal   Collection Time: 09/11/22  8:22 PM  Result Value Ref Range   WBC 8.4 4.0 - 10.5 K/uL   RBC 5.40 (H) 3.87 - 5.11 MIL/uL   Hemoglobin 16.1 (H) 12.0 - 15.0 g/dL   HCT 16.1 09.6 - 04.5 %   MCV 84.4 80.0 - 100.0 fL   MCH 29.8 26.0 - 34.0 pg   MCHC 35.3 30.0 - 36.0 g/dL   RDW 40.9 81.1 - 91.4 %   Platelets 198 150 - 400 K/uL   nRBC 0.0 0.0 - 0.2 %   Neutrophils Relative % 70 %   Neutro Abs 6.1 1.7 - 7.7 K/uL   Lymphocytes Relative 17 %   Lymphs Abs 1.4 0.7 - 4.0 K/uL   Monocytes Relative 10 %   Monocytes Absolute 0.8 0.1 - 1.0 K/uL   Eosinophils Relative 1 %   Eosinophils Absolute 0.1 0.0 - 0.5 K/uL   Basophils Relative 1 %   Basophils Absolute 0.1 0.0 - 0.1 K/uL   Immature Granulocytes 1 %   Abs Immature Granulocytes 0.04 0.00 - 0.07 K/uL    Comment: Performed at [facility], [address], [place], [place] [address]  Ethanol     Status: None   Collection Time: 09/11/22  8:22 PM  Result Value Ref Range   Alcohol, Ethyl (B) <10 <10 mg/dL    Comment: (NOTE) Lowest detectable limit for serum alcohol is 10 mg/dL.  For medical purposes only. Performed at [facility], [address], [place], [place] [address]   Troponin I (High Sensitivity)     Status: None   Collection Time: 09/11/22 10:15 PM  Result Value Ref Range   Troponin I (High Sensitivity) 10 <18 ng/L    Comment: (NOTE) Elevated high sensitivity troponin I (hsTnI) values and significant  changes across serial measurements may suggest ACS but many other  chronic and acute  conditions are known to elevate hsTnI results.  Refer to the "Links" section for chest pain algorithms and additional  guidance. Performed at [facility], [address] [place], [place] [address]   Magnesium     Status: None   Collection Time: 09/11/22 10:15 PM  Result Value Ref Range   Magnesium 1.8 1.7 - 2.4 mg/dL    Comment: Performed at [facility], [address] [place], [place] [address]  Basic metabolic panel     Status: Abnormal   Collection Time: 09/12/22  4:02 AM  Result Value Ref Range   Sodium 138 135 - 145 mmol/L   Potassium <2.0 (LL) 3.5 - 5.1 mmol/L    Comment: CRITICAL RESULT CALLED TO, READ BACK BY AND VERIFIED WITH A. KELSEY, RN  09/12/22 0453 BY K. DAVIS   Chloride 97 (L) 98 - 111 mmol/L   CO2 32 22 - 32 mmol/L   Glucose, Bld 118 (H) 70 - 99 mg/dL    Comment: Glucose reference range applies only to samples taken after fasting for at least 8 hours.   BUN 6 (L) 8 - 23 mg/dL   Creatinine, Ser 9.52 0.44 - 1.00 mg/dL   Calcium 8.2 (L) 8.9 - 10.3 mg/dL   GFR, Estimated >84 >13 mL/min    Comment: (NOTE) Calculated using the CKD-EPI Creatinine Equation (2021)    Anion gap 9 5 - 15    Comment: Performed at [facility], [address] [place], [place] [address]  CBC     Status: Abnormal  Collection Time: 09/12/22  4:02 AM  Result Value Ref Range   WBC 6.7 4.0 - 10.5 K/uL   RBC 5.17 (H) 3.87 - 5.11 MIL/uL   Hemoglobin 15.1 (H) 12.0 - 15.0 g/dL   HCT 16.1 09.6 - 04.5 %   MCV 85.7 80.0 - 100.0 fL   MCH 29.2 26.0 - 34.0 pg   MCHC 34.1 30.0 - 36.0 g/dL   RDW 40.9 81.1 - 91.4 %   Platelets 177 150 - 400 K/uL   nRBC 0.0 0.0 - 0.2 %    Comment: Performed at [facility], [address] [place], [place] [address]  Magnesium     Status: None   Collection Time: 09/12/22  4:02 AM  Result Value Ref Range   Magnesium 1.7 1.7 - 2.4 mg/dL    Comment: Performed at [facility], [address], [place], [place] [address]  Basic metabolic panel     Status: Abnormal   Collection Time: 09/12/22  8:13 AM  Result Value Ref Range   Sodium 135 135 - 145 mmol/L   Potassium 2.2 (LL) 3.5 - 5.1 mmol/L    Comment: CRITICAL RESULT CALLED TO, READ BACK BY AND VERIFIED WITH BREANNA S. RN AT 1044 ON 09/12/2022 BY MECIAL J.    Chloride 94 (L) 98 - 111 mmol/L   CO2 30 22 - 32 mmol/L   Glucose, Bld 183 (H) 70 - 99 mg/dL    Comment: Glucose reference range applies only to samples taken after fasting for at least 8 hours.   BUN <5 (L) 8 - 23 mg/dL   Creatinine, Ser 8.65 0.44 - 1.00 mg/dL   Calcium 7.6 (L) 8.9 - 10.3 mg/dL   GFR, Estimated >78 >46 mL/min    Comment: (NOTE) Calculated using the CKD-EPI Creatinine Equation (2021)    Anion gap 11 5 - 15    Comment: Performed at [facility], [address] [place], [place] [address]  Urinalysis, Routine w reflex microscopic -Urine, Clean Catch     Status: Abnormal   Collection Time: 09/12/22  9:24 AM  Result Value Ref Range   Color, Urine YELLOW YELLOW   APPearance HAZY (A) CLEAR   Specific Gravity, Urine 1.003 (L) 1.005 - 1.030   pH 7.0 5.0 - 8.0   Glucose, UA NEGATIVE NEGATIVE mg/dL   Hgb urine dipstick MODERATE (A) NEGATIVE   Bilirubin Urine NEGATIVE NEGATIVE   Ketones, ur NEGATIVE NEGATIVE mg/dL   Protein, ur NEGATIVE NEGATIVE mg/dL   Nitrite POSITIVE (A) NEGATIVE   Leukocytes,Ua LARGE (A) NEGATIVE   RBC / HPF 6-10 0 - 5 RBC/hpf   WBC, UA >50 0 - 5 WBC/hpf   Bacteria, UA MANY (A) NONE SEEN   Squamous Epithelial / HPF 0-5 0 - 5 /HPF   Mucus PRESENT     Comment: Performed at [facility], [address], [place], [place] [address]  Na and K (sodium & potassium), rand urine     Status: None   Collection Time: 09/12/22  9:29 AM  Result Value Ref Range   Sodium, Ur 16 mmol/L   Potassium Urine 6 mmol/L    Comment: Performed at [facility], [address], [place], [place] [address]   No results found.  Pending Labs Unresulted Labs (From admission, onward)     Start     Ordered   09/12/22 0927  Osmolality  Add-on,   AD  09/12/22 0926   09/12/22 0927  Osmolality, urine  Once,   R        09/12/22 0926   09/12/22 0813  Basic metabolic panel  Now then every 6 hours,   R (with TIMED occurrences)      09/12/22 0812            Vitals/Pain Today's Vitals   09/12/22 0700 09/12/22 0737 09/12/22 0738 09/12/22 1030  BP: 125/81  (!) 143/96 104/70  Pulse: 89  88 86  Resp: 12  13 19   Temp:   98.1 F (36.7 C)   TempSrc:   Oral   SpO2: 97%  99% 95%  Weight:      Height:      PainSc:  4  4      Isolation Precautions No active isolations  Medications Medications  acetaminophen (TYLENOL) tablet 500 mg (has no administration in time range)    Or  acetaminophen (TYLENOL) suppository 650 mg (has no administration in time range)  ondansetron (ZOFRAN) tablet 4 mg ( Oral See Alternative 09/11/22 2306)    Or  ondansetron (ZOFRAN) injection 4 mg (4 mg Intravenous Given 09/11/22 2306)  sorbitol 70 % solution 30 mL (has no administration in time range)  melatonin tablet 3 mg (3 mg Oral Given 09/12/22 0053)  acetaminophen (TYLENOL) tablet 650 mg (650 mg Oral Given 09/12/22 0909)  traZODone (DESYREL) tablet 100 mg (has no administration in time range)  apixaban (ELIQUIS) tablet 5 mg (5 mg Oral Given 09/12/22 0910)  cyanocobalamin (VITAMIN B12) tablet 1,000 mcg (1,000 mcg Oral Given 09/12/22 0910)  gabapentin (NEURONTIN) capsule 100 mg (100 mg Oral Given 09/12/22 0909)  thiamine (VITAMIN B1) tablet 100 mg (100 mg Oral Given 09/12/22 0910)    Or  thiamine (VITAMIN B1) injection 100 mg ( Intravenous See Alternative 09/12/22 0910)  folic acid (FOLVITE) tablet 1 mg (1 mg Oral Given 09/12/22 0909)  multivitamin with minerals tablet 1 tablet (1 tablet Oral Given 09/12/22 0909)  lactated ringers 1,000 mL with potassium chloride 40 mEq infusion  (has no administration in time range)  potassium chloride 10 mEq in 100 mL IVPB (10 mEq Intravenous New Bag/Given 09/12/22 0951)  potassium chloride SA (KLOR-CON M) CR tablet 40 mEq (40 mEq Oral Given 09/12/22 0909)  cefadroxil (DURICEF) capsule 500 mg (500 mg Oral Given 09/12/22 0943)  sodium chloride 0.9 % bolus 500 mL (0 mLs Intravenous Stopped 09/11/22 2306)  potassium chloride 10 mEq in 100 mL IVPB (0 mEq Intravenous Stopped 09/12/22 0738)  potassium chloride SA (KLOR-CON M) CR tablet 40 mEq (40 mEq Oral Given 09/11/22 2243)  sterile water (preservative free) injection (  Given 09/12/22 0143)  magnesium sulfate IVPB 2 g 50 mL (2 g Intravenous New Bag/Given 09/12/22 0830)    Mobility non-ambulatory     Focused Assessments Cardiac Assessment Handoff:    Lab Results  Component Value Date   CKTOTAL 61 04/30/2022   TROPONINI 0.03 (HH) 06/14/2016   Lab Results  Component Value Date   DDIMER 0.86 (H) 01/04/2018   Does the Patient currently have chest pain? No    R Recommendations: See Admitting Provider Note  Report given to:   Additional Notes:

## 2022-09-12 NOTE — Progress Notes (Signed)
At bedside for PICC placement. Patient has refused PICC placement at this time stating, " I do not want it. It is very painful and dangerous. You are a liar." Educated the patient on the risks and benefits of placement, and that potassium can be damaging to the vessels with multiple infusions. Pt. remains adamant and refuses PICC. Pt currently has 1 functioning PIV at this time. RN at bedside and aware of refusal. Larwance Rote, NP made aware.

## 2022-09-12 NOTE — Progress Notes (Addendum)
PROGRESS NOTE    [name]  ZOX:[id number] DOB: October 16, 1940 DOA: 09/11/2022 PCP: Pcp, No  Chief Complaint  Patient presents with   Weakness    Brief Narrative:   Denise Kelly is Denise Kelly 82 y.o. female with medical history significant for history of PE on Eliquis, lymphedema and chronic lower extremity pain who was found in her room covered in diarrhea.  The patient lives in an independent living facility facility and staff went to check on her because she had not been seen in [place] couple of days.  The patient reports that she had terrible diarrhea for approximately 3 days.  But the diarrhea has now resolved.  She has been diarrhea free for the last 2 days and she denies any pain or fever.  Her appetite is at baseline and she has been eating.  She denies any fevers or chest pain. The patient was brought into the emergency department by EMS.  Her workup revealed severe hypokalemia with potassium less than 2.  Assessment & Plan:   Principal Problem:   Hypokalemia Active Problems:   LAFB (left anterior fascicular block)   Alcohol dependence (HCC)   RLS (restless legs syndrome)   Chronic pain syndrome   Lymphedema   History of pulmonary embolus (PE)  Addendum 4:57 PM K still severely low, but only got 3/6 runs of K that were ordered from this morning (4th now running).  Issues with IV?  Will reorder another 6 runs kcl.  Has Amun Stemm 3rd dose of oral kcl later tonight.  Still on LR + 40 meq/L kcl.  Continue to trend BMP's.  Discussed with RN to work on 2nd IV.  Severe Hypokalemia Likely related to acute diarrheal illness, but possibly also related to chronic diuretic therapy with lasix (increased to BID lasix in March give her LE edema, though she hasn't taken this in 10 days) With severe hypokalemia, will follow urine k, serum osm, urine osm  Aggressively replace with frequent BMP checks Goal mag >2   Diarrhea Seems resolved Will follow, workup if recurrent  Lower Extremity  Cellulitis Likely has venous stasis changes, but erythema and tenderness could represent cellulitis Will treat with duricef x5 days  Lymphedema  Chronic Lower Extremity Pain gabapentin, tylenol (doesn't appear she's taking cymbalta or tramadol) Needs outpatient lymphedema care  Hx Etoh Abuse [place]  Hypertension Not on any meds for this  Hx PE and DVT Eliquis   Not clear to me which medicines she's taking.  The only one she was able to confidently report to me was eliquis.  Per med rec, not taking cymbalta, lasix, melatonin, protonix, miralax, potassium.  Also her last dispense of eliquis was 3/1?  She lives home alone.  Will follow this up with pharmacy.      DVT prophylaxis: eliquis Code Status: full Family Communication: none Disposition:   Status is: Inpatient Remains inpatient appropriate because: continued need for potassium replacement   Consultants:  none  Procedures:  none  Antimicrobials:  Anti-infectives (From admission, onward)    None       Subjective: Says "Where's Greggory Stallion?" When I introduce myself She was looking for my hospitalist colleague who it sounds like took care of her previously No new complaints Notes about 3-4 days of diarrhea, now resolved Hasn't taken lasix in about 10 days  Objective: Vitals:   09/12/22 0553 09/12/22 0557 09/12/22 0700 09/12/22 0738  BP: 113/67  125/81 (!) 143/96  Pulse: 90  89 88  Resp: 20  12 13  Temp:  98.1 F (36.7 C)  98.1 F (36.7 C)  TempSrc:  Oral  Oral  SpO2: 95%  97% 99%  Weight:      Height:        Intake/Output Summary (Last 24 hours) at 09/12/2022 0847 Last data filed at 09/11/2022 1952 Gross per 24 hour  Intake 500 ml  Output --  Net 500 ml   Filed Weights   09/11/22 1944  Weight: 90.7 kg    Examination:  General exam: Appears calm and comfortable  Respiratory system: Clear to auscultation. Respiratory effort normal. Cardiovascular system: S1 & S2 heard, RRR. No JVD, murmurs, rubs,  gallops or clicks. No pedal edema. Gastrointestinal system: Abdomen is nondistended, soft and nontender. No organomegaly or masses felt. Normal bowel sounds heard. Central nervous system: Alert and oriented. No focal neurological deficits. Extremities: Symmetric 5 x 5 power. Skin: No rashes, lesions or ulcers Psychiatry: Judgement and insight appear normal. Mood & affect appropriate.     Data Reviewed: I have personally reviewed following labs and imaging studies  CBC: Recent Labs  Lab 09/11/22 2022 09/12/22 0402  WBC 8.4 6.7  NEUTROABS 6.1  --   HGB 16.1* 15.1*  HCT 45.6 44.3  MCV 84.4 85.7  PLT 198 177    Basic Metabolic Panel: Recent Labs  Lab 09/11/22 2022 09/11/22 2215 09/12/22 0402  NA 134*  --  138  K <2.0*  --  <2.0*  CL 88*  --  97*  CO2 36*  --  32  GLUCOSE 137*  --  118*  BUN 8  --  6*  CREATININE 0.73  --  0.62  CALCIUM 8.3*  --  8.2*  MG  --  1.8 1.7    GFR: Estimated Creatinine Clearance: 64.8 mL/min (by C-G formula based on SCr of 0.62 mg/dL).  Liver Function Tests: Recent Labs  Lab 09/11/22 2022  AST 24  ALT 15  ALKPHOS 87  BILITOT 1.6*  PROT 6.7  ALBUMIN 3.3*    CBG: No results for input(s): "GLUCAP" in the last 168 hours.   No results found for this or any previous visit (from the past 240 hour(s)).       Radiology Studies: No results found.      Scheduled Meds:  acetaminophen  650 mg Oral BID   apixaban  5 mg Oral BID   cyanocobalamin  1,000 mcg Oral Daily   folic acid  1 mg Oral Daily   gabapentin  100 mg Oral TID   multivitamin with minerals  1 tablet Oral Daily   potassium chloride  40 mEq Oral Q4H   thiamine  100 mg Oral Daily   Or   thiamine  100 mg Intravenous Daily   Continuous Infusions:  lactated ringers 1,000 mL with potassium chloride 40 mEq infusion     magnesium sulfate bolus IVPB 2 g (09/12/22 0830)   potassium chloride       LOS: 1 day    Time spent: over 30 min     Lacretia Nicks,  MD [facility]   To contact the attending provider between 7A-7P or the covering provider during after hours 7P-7A, please log into the web site [email or web address] and access using universal [place] password for that web site. If you do not have the password, please call the hospital operator.  09/12/2022, 8:47 AM

## 2022-09-13 DIAGNOSIS — E876 Hypokalemia: Secondary | ICD-10-CM | POA: Diagnosis not present

## 2022-09-13 LAB — BASIC METABOLIC PANEL
Anion gap: 6 (ref 5–15)
BUN: 5 mg/dL — ABNORMAL LOW (ref 8–23)
CO2: 31 mmol/L (ref 22–32)
Calcium: 8.4 mg/dL — ABNORMAL LOW (ref 8.9–10.3)
Chloride: 102 mmol/L (ref 98–111)
Creatinine, Ser: 0.65 mg/dL (ref 0.44–1.00)
GFR, Estimated: >60 mL/min (ref >60)
Glucose, Bld: 137 mg/dL — ABNORMAL HIGH (ref 70–99)
Potassium: 4.1 mmol/L (ref 3.5–5.1)
Sodium: 139 mmol/L (ref 135–145)

## 2022-09-13 LAB — URINE CULTURE: Culture: >=100000 — AB

## 2022-09-13 LAB — OSMOLALITY, URINE: Osmolality, Ur: 126 mOsm/kg — ABNORMAL LOW (ref 300–900)

## 2022-09-13 MED ORDER — HYDROXYZINE HCL 10 MG PO TABS
10.0000 mg | ORAL_TABLET | Freq: Three times a day (TID) | ORAL | Status: DC | PRN
  Administered 2022-09-13 – 2022-09-24 (×7): 10 mg via ORAL
  Filled 2022-09-13 (×9): qty 1

## 2022-09-13 NOTE — Progress Notes (Signed)
Lab woke patient up to draw labs. Lab came to get me because patient was combative and refused so I went into patient's room trying to talk with her. She cursed at [place] and told us to leave her alone and to get the "xxxx" out of her room. She also refused her PO medication. I informed Anthoney Harada about patient's refusal of medications and lab draws.   She refused her PICC placement. See IV teams note.  I was able to administer all of the scheduled IV potassium through her PIV.

## 2022-09-13 NOTE — Evaluation (Signed)
Physical Therapy Evaluation Patient Details Name: Denise Kelly MRN: [id number] DOB: [birth year] Today's Date: 09/13/2022  History of Present Illness  82 y.o. female with medical history significant for history of PE on Eliquis, lymphedema, cellulitis, chronic pain syndrome, medical noncompliance, L and R TKAs, lumbar fusion  who was found in her room covered in diarrhea.  The patient lives in an independent living facility facility and staff went to check on her because she had not been seen in a couple of days.  Pt admitted 09/11/22 for severe hypokalemia  Clinical Impression  Pt admitted with above diagnosis.  Pt currently with functional limitations due to the deficits listed below (see PT Problem List). Pt will benefit from acute skilled PT to increase their independence and safety with mobility to allow discharge.  Pt very resistant to movement even with assist for pericare and change of bed pads.  Pt reports fear of falling and "scared of this place."  Pt wants to return home and doesn't think she needs anything upon d/c, however pt found at home in her bed soiled and not able to fully perform hygiene per notes.  Pt with a few admission this year in Feb and March and was ambulatory at that time however also at times refusing to participate.  Will attempt to assist pt with mobility as pt allows.  Pt may need increase in care upon d/c however pt may or may not be willing to participate in therapies.         Recommendations for follow up therapy are one component of a multi-disciplinary discharge planning process, led by the attending physician.  Recommendations may be updated based on patient status, additional functional criteria and insurance authorization.  Follow Up Recommendations Can patient physically be transported by private vehicle: No     Assistance Recommended at Discharge Intermittent Supervision/Assistance  Patient can return home with the following  A lot of help with walking  and/or transfers;A lot of help with bathing/dressing/bathroom;Assistance with cooking/housework;Assist for transportation;Direct supervision/assist for medications management;Help with stairs or ramp for entrance;Direct supervision/assist for financial management    Equipment Recommendations None recommended by PT  Recommendations for Other Services       Functional Status Assessment Patient has had a recent decline in their functional status and demonstrates the ability to make significant improvements in function in a reasonable and predictable amount of time.     Precautions / Restrictions Precautions Precautions: Fall Restrictions Weight Bearing Restrictions: No      Mobility  Bed Mobility Overal bed mobility: Needs Assistance Bed Mobility: Rolling Rolling: Min assist         General bed mobility comments: pt only agreeable to perform partial roll despite attempting to ease fear of falling (rails up and one person on each side); pt assists at her preference so assist was variable, appears able to bridge, assisted nurse tech with rolling and holding for pericare and change of bed pads; pt refused any other form of activity    Transfers                        Ambulation/Gait                  Stairs            Wheelchair Mobility    Modified Rankin (Stroke Patients Only)       Balance  Pertinent Vitals/Pain Pain Assessment Pain Assessment: Faces Faces Pain Scale: Hurts little more Pain Location: any location touched to assist pt, she also frequently stated "don't touch me" - uncertain if pain, fear, or personality Pain Descriptors / Indicators: Guarding Pain Intervention(s): Repositioned, Monitored during session    Home Living   Living Arrangements: Alone   Type of Home: Independent living facility         Home Layout: One level Home Equipment: Agricultural consultant (2  wheels)      Prior Function Prior Level of Function : Needs assist       Physical Assist : Mobility (physical);ADLs (physical)     Mobility Comments: uses walker, reports being very fearful of falling ADLs Comments: Patient reports being independent however found soiled in stool at facility. (patient had help from daughter in past for ADLs and IADLs however daughter has passed per May 04, 2022 admission notes)     Hand Dominance        Extremity/Trunk Assessment   Upper Extremity Assessment Upper Extremity Assessment: Generalized weakness    Lower Extremity Assessment Lower Extremity Assessment: Generalized weakness       Communication   Communication: HOH  Cognition Arousal/Alertness: Awake/alert Behavior During Therapy: Flat affect Overall Cognitive Status: No family/caregiver present to determine baseline cognitive functioning                                 General Comments: pt very fearful of falling; quick to say "don't touch me" and "I'm not doing that"        General Comments      Exercises     Assessment/Plan    PT Assessment    PT Problem List         PT Treatment Interventions      PT Goals (Current goals can be found in the Care Plan section)  Acute Rehab PT Goals PT Goal Formulation: With patient Time For Goal Achievement: 09/27/22 Potential to Achieve Goals: Poor    Frequency       Co-evaluation               AM-PAC PT "6 Clicks" Mobility  Outcome Measure Help needed turning from your back to your side while in a flat bed without using bedrails?: A Lot Help needed moving from lying on your back to sitting on the side of a flat bed without using bedrails?: Total Help needed moving to and from a bed to a chair (including a wheelchair)?: Total Help needed standing up from a chair using your arms (e.g., wheelchair or bedside chair)?: Total Help needed to walk in hospital room?: Total Help needed climbing 3-5 steps with a  railing? : Total 6 Click Score: 7    End of Session   Activity Tolerance: Other (comment) (pt self limiting "this place scares me") Patient left: in bed;with call bell/phone within reach;with nursing/sitter in room Nurse Communication: Mobility status PT Visit Diagnosis: Muscle weakness (generalized) (M62.81)    Time: 7616-0737 PT Time Calculation (min) (ACUTE ONLY): 23 min   Thomasene Mohair PT, DPT Physical Therapist Acute Rehabilitation Services Office: [phone]   Denise Kelly Payson 09/13/2022, 2:40 PM

## 2022-09-13 NOTE — Progress Notes (Addendum)
PROGRESS NOTE    Denise Kelly  ZOX:[id number] DOB: 1940-10-20 DOA: 09/11/2022 PCP: Pcp, No  Chief Complaint  Patient presents with   Weakness    Brief Narrative:   Denise Kelly is Denise Kelly 82 y.o. female with medical history significant for history of PE on Eliquis, lymphedema and chronic lower extremity pain who was found in her room covered in diarrhea.  The patient lives in an independent living facility facility and staff went to check on her because she had not been seen in [place] couple of days.  The patient reports that she had terrible diarrhea for approximately 3 days.  But the diarrhea has now resolved.  She has been diarrhea free for the last 2 days and she denies any pain or fever.  Her appetite is at baseline and she has been eating.  She denies any fevers or chest pain. The patient was brought into the emergency department by EMS.  Her workup revealed severe hypokalemia with potassium less than 2.  Assessment & Plan:   Principal Problem:   Hypokalemia Active Problems:   LAFB (left anterior fascicular block)   Alcohol dependence (HCC)   RLS (restless legs syndrome)   Chronic pain syndrome   Lymphedema   History of pulmonary embolus (PE)  Cognitive Impairment  Seems to have some memory issues.  Didn't remember me when I saw her today.  Apparently she lives alone in ILF.  Manages her meds.  Only taking eliquis?  But her recent med list seems to have more medications than just this?  Will need to see how she does with PT/OT.  She was found during [place] welfare check.  I think she'll need rehab.  Severe Hypokalemia Likely related to acute diarrheal illness, but possibly also related to chronic diuretic therapy with lasix (increased to BID lasix in March give her LE edema, though she hasn't taken this in 10 days) Improved today, will repeat this afternoon Likely needs some ongoing replacement, will follow  Aggressively replace with frequent BMP checks Goal mag >2   Asymptomatic  Bacteruria Currently I don't see indication for treatment Follow final culture   Diarrhea Seems resolved Will follow, workup if recurrent  Lower Extremity Cellulitis Likely has venous stasis changes, but erythema and tenderness could represent cellulitis Will treat with duricef x5 days  Lymphedema  Chronic Lower Extremity Pain gabapentin, tylenol (doesn't appear she's taking cymbalta or tramadol) Needs outpatient lymphedema care  Hx Etoh Abuse Ciwa  Hypertension Not on any meds for this  Hx PE and DVT Eliquis   Not clear to me which medicines she's taking.  The only one she was able to confidently report to me was eliquis.  Per med rec, not taking cymbalta, lasix, melatonin, protonix, miralax, potassium.  Also her last dispense of eliquis was 3/1?  She lives home alone.  Will follow this up with pharmacy.      DVT prophylaxis: eliquis Code Status: full Family Communication: none Disposition:   Status is: Inpatient Remains inpatient appropriate because: continued need for potassium replacement   Consultants:  none  Procedures:  none  Antimicrobials:  Anti-infectives (From admission, onward)    Start     Dose/Rate Route Frequency Ordered Stop   09/12/22 1000  cefadroxil (DURICEF) capsule 500 mg        500 mg Oral 2 times daily 09/12/22 0848 09/17/22 0959       Subjective: Asking again for Greggory Stallion Brenly Trawick bit forgetful, doesn't remember me from yesterday  Objective:  Vitals:   09/12/22 1134 09/12/22 1301 09/12/22 1802 09/13/22 0425  BP:  (!) 167/86 (!) 129/97 (!) 129/53  Pulse:  81 84 73  Resp:   18 18  Temp: 97.7 F (36.5 C)  98.4 F (36.9 C) 98.2 F (36.8 C)  TempSrc: Oral  Oral Oral  SpO2:   95% 95%  Weight:      Height:        Intake/Output Summary (Last 24 hours) at 09/13/2022 1406 Last data filed at 09/13/2022 0830 Gross per 24 hour  Intake 1069.25 ml  Output 1900 ml  Net -830.75 ml   Filed Weights   09/11/22 1944  Weight: 90.7 kg     Examination:  General exam: Appears calm and comfortable  Respiratory system: unlabored Cardiovascular system: RRR Gastrointestinal system: Abdomen is nondistended, soft and nontender. Central nervous system: Aler, forgetful, didn't remember who I was. No focal neurological deficits. Extremities: very tender to bilateral legs    Data Reviewed: I have personally reviewed following labs and imaging studies  CBC: Recent Labs  Lab 09/11/22 2022 09/12/22 0402  WBC 8.4 6.7  NEUTROABS 6.1  --   HGB 16.1* 15.1*  HCT 45.6 44.3  MCV 84.4 85.7  PLT 198 177    Basic Metabolic Panel: Recent Labs  Lab 09/11/22 2022 09/11/22 2215 09/12/22 0402 09/12/22 0813 09/12/22 1502 09/13/22 0409  NA 134*  --  138 135 142 139  K <2.0*  --  <2.0* 2.2* 2.2* 4.1  CL 88*  --  97* 94* 103 102  CO2 36*  --  32 30 32 31  GLUCOSE 137*  --  118* 183* 110* 137*  BUN 8  --  6* <5* <5* 5*  CREATININE 0.73  --  0.62 0.63 0.62 0.65  CALCIUM 8.3*  --  8.2* 7.6* 8.1* 8.4*  MG  --  1.8 1.7  --   --   --     GFR: Estimated Creatinine Clearance: 64.8 mL/min (by C-G formula based on SCr of 0.65 mg/dL).  Liver Function Tests: Recent Labs  Lab 09/11/22 2022  AST 24  ALT 15  ALKPHOS 87  BILITOT 1.6*  PROT 6.7  ALBUMIN 3.3*    CBG: No results for input(s): "GLUCAP" in the last 168 hours.   Recent Results (from the past 240 hour(s))  Urine Culture (for pregnant, neutropenic or urologic patients or patients with an indwelling urinary catheter)     Status: Abnormal (Preliminary result)   Collection Time: 09/12/22 12:24 PM   Specimen: Urine, Clean Catch  Result Value Ref Range Status   Specimen Description   Final    URINE, CLEAN CATCH Performed at [facility], [address], [place], [place] [address]    Special Requests   Final    NONE Performed at [facility], [address] [place], [place] [address]    Culture >=100,000 COLONIES/mL  KLEBSIELLA PNEUMONIAE (Areanna Gengler)  Final   Report Status PENDING  Incomplete         Radiology Studies: [place] EKG SITE RITE  Result Date: 09/12/2022 If Site Rite image not attached, placement could not be confirmed due to current cardiac rhythm.       Scheduled Meds:  acetaminophen  650 mg Oral BID   apixaban  5 mg Oral BID   cefadroxil  500 mg Oral BID   cyanocobalamin  1,000 mcg Oral Daily   folic acid  1 mg Oral Daily   gabapentin  100  mg Oral TID   multivitamin with minerals  1 tablet Oral Daily   thiamine  100 mg Oral Daily   Or   thiamine  100 mg Intravenous Daily   Continuous Infusions:  lactated ringers 1,000 mL with potassium chloride 40 mEq infusion 75 mL/hr at 09/13/22 0604     LOS: 2 days    Time spent: over 30 min     Lacretia Nicks, MD [facility]   To contact the attending provider between 7A-7P or the covering provider during after hours 7P-7A, please log into the web site [email or web address] and access using universal [place] password for that web site. If you do not have the password, please call the hospital operator.  09/13/2022, 2:06 PM

## 2022-09-13 NOTE — Progress Notes (Addendum)
Attempted to assess and take patient to the [facility] multiple times. She would call the NT and I to come into her room to take her to the [facility] but each time we put the side rail down she would yell at [place] to "put the rail up and get the hell out of her room".  Patient refused all mobility attempts also.

## 2022-09-14 DIAGNOSIS — E876 Hypokalemia: Secondary | ICD-10-CM | POA: Diagnosis not present

## 2022-09-14 LAB — COMPREHENSIVE METABOLIC PANEL
ALT: 12 U/L (ref 0–44)
AST: 17 U/L (ref 15–41)
Albumin: 2.9 g/dL — ABNORMAL LOW (ref 3.5–5.0)
Alkaline Phosphatase: 73 U/L (ref 38–126)
Anion gap: 6 (ref 5–15)
BUN: 6 mg/dL — ABNORMAL LOW (ref 8–23)
CO2: 28 mmol/L (ref 22–32)
Calcium: 8.5 mg/dL — ABNORMAL LOW (ref 8.9–10.3)
Chloride: 104 mmol/L (ref 98–111)
Creatinine, Ser: 0.64 mg/dL (ref 0.44–1.00)
GFR, Estimated: >60 mL/min (ref >60)
Glucose, Bld: 110 mg/dL — ABNORMAL HIGH (ref 70–99)
Potassium: 4.1 mmol/L (ref 3.5–5.1)
Sodium: 138 mmol/L (ref 135–145)
Total Bilirubin: 0.6 mg/dL (ref 0.3–1.2)
Total Protein: 6 g/dL — ABNORMAL LOW (ref 6.5–8.1)

## 2022-09-14 LAB — CBC WITH DIFFERENTIAL/PLATELET
Abs Immature Granulocytes: 0.02 10*3/uL (ref 0.00–0.07)
Basophils Absolute: 0.1 10*3/uL (ref 0.0–0.1)
Basophils Relative: 1 %
Eosinophils Absolute: 0.2 10*3/uL (ref 0.0–0.5)
Eosinophils Relative: 3 %
HCT: 39.3 % (ref 36.0–46.0)
Hemoglobin: 13.2 g/dL (ref 12.0–15.0)
Immature Granulocytes: 0 %
Lymphocytes Relative: 38 %
Lymphs Abs: 2.3 10*3/uL (ref 0.7–4.0)
MCH: 30 pg (ref 26.0–34.0)
MCHC: 33.6 g/dL (ref 30.0–36.0)
MCV: 89.3 fL (ref 80.0–100.0)
Monocytes Absolute: 0.5 10*3/uL (ref 0.1–1.0)
Monocytes Relative: 9 %
Neutro Abs: 2.9 10*3/uL (ref 1.7–7.7)
Neutrophils Relative %: 49 %
Platelets: 150 10*3/uL (ref 150–400)
RBC: 4.4 MIL/uL (ref 3.87–5.11)
RDW: 14.2 % (ref 11.5–15.5)
WBC: 5.9 10*3/uL (ref 4.0–10.5)
nRBC: 0 % (ref 0.0–0.2)

## 2022-09-14 LAB — URINE CULTURE

## 2022-09-14 LAB — PHOSPHORUS: Phosphorus: 3.6 mg/dL (ref 2.5–4.6)

## 2022-09-14 LAB — MAGNESIUM: Magnesium: 2 mg/dL (ref 1.7–2.4)

## 2022-09-14 NOTE — Progress Notes (Signed)
Patient continues to be rude to staff and non compliant with care this shift. Refused to all staff to perform hygiene, bed mobility, or vital signs.

## 2022-09-14 NOTE — Progress Notes (Signed)
OT Cancellation Note  Patient Details Name: EVALENA CLUTE MRN: [id number] DOB: March 20, 1940   Cancelled Treatment:    Reason Eval/Treat Not Completed: Patient declined, no reason specified. Pt stated, "I don't want it." (referring to therapy). She also stated, "leave me alone."   Reuben Likes, OTR/L 09/14/2022, 9:38 AM

## 2022-09-14 NOTE — Progress Notes (Addendum)
PROGRESS NOTE    Denise Kelly  WGN:[id number] DOB: 21-Aug-1940 DOA: 09/11/2022 PCP: Pcp, No  Chief Complaint  Patient presents with   Weakness    Brief Narrative:   Denise Kelly is Denise Kelly 82 y.o. female with medical history significant for history of PE on Eliquis, lymphedema and chronic lower extremity pain who was found in her room covered in diarrhea.  The patient lives in an independent living facility facility and staff went to check on her because she had not been seen in Denise Kelly couple of days.  The patient reports that she had terrible diarrhea for approximately 3 days.  But the diarrhea has now resolved.  She has been diarrhea free for the last 2 days and she denies any pain or fever.  Her appetite is at baseline and she has been eating.  She denies any fevers or chest pain. The patient was brought into the emergency department by EMS.  Her workup revealed severe hypokalemia with potassium less than 2.  Assessment & Plan:   Principal Problem:   Hypokalemia Active Problems:   LAFB (left anterior fascicular block)   Alcohol dependence (HCC)   RLS (restless legs syndrome)   Chronic pain syndrome   Lymphedema   History of pulmonary embolus (PE)  Cognitive Impairment  Seems to have some memory issues.  Didn't remember me when I saw her today.  Apparently she lives alone in ILF.  Manages her meds.  Only taking eliquis?  But her recent med list seems to have more medications than just this?  Will need to see how she does with PT/OT -> recommending SNF.  She was found during Denise Kelly welfare check.  I think she'll need rehab.  Severe Hypokalemia Likely related to acute diarrheal illness, but possibly also related to chronic diuretic therapy with lasix (increased to BID lasix in March give her LE edema, though she hasn't taken this in 10 days) Improved today, will repeat this afternoon Likely needs some ongoing replacement, will follow  Aggressively replace with frequent BMP checks Goal mag >2    Klebsiella Pneumoniae UTI Covered by duricef She's having dysuria  Diarrhea Seems resolved Will follow, workup if recurrent  Lower Extremity Cellulitis Likely has venous stasis changes, but erythema and tenderness could represent cellulitis Will treat with duricef x5 days  Lymphedema  Chronic Lower Extremity Pain gabapentin, tylenol (doesn't appear she's taking cymbalta or tramadol) Needs outpatient lymphedema care  Hx Etoh Abuse Ciwa  Hypertension Not on any meds for this  Hx PE and DVT Eliquis   Not clear to me which medicines she's taking.  The only one she was able to confidently report to me was eliquis.  Per med rec, not taking cymbalta, lasix, melatonin, protonix, miralax, potassium.  Also her last dispense of eliquis was 3/1?  She lives home alone.  Will follow this up with pharmacy.      DVT prophylaxis: eliquis Code Status: full Family Communication: none Disposition:   Status is: Inpatient Remains inpatient appropriate because: continued need for potassium replacement   Consultants:  none  Procedures:  none  Antimicrobials:  Anti-infectives (From admission, onward)    Start     Dose/Rate Route Frequency Ordered Stop   09/12/22 1000  cefadroxil (DURICEF) capsule 500 mg        500 mg Oral 2 times daily 09/12/22 0848 09/17/22 0959       Subjective: No new complaints  Objective: Vitals:   09/13/22 0425 09/13/22 1430 09/14/22 0644 09/14/22  1521  BP: (!) 129/53 123/64 137/84 (!) 117/99  Pulse: 73 89 85 81  Resp: 18 18 19 18   Temp: 98.2 F (36.8 C) 97.6 F (36.4 C) 97.8 F (36.6 C) 98 F (36.7 C)  TempSrc: Oral Oral Oral Oral  SpO2: 95% 94% 94% 98%  Weight:      Height:        Intake/Output Summary (Last 24 hours) at 09/14/2022 1610 Last data filed at 09/14/2022 1455 Gross per 24 hour  Intake 4085.85 ml  Output 4650 ml  Net -564.15 ml   Filed Weights   09/11/22 1944  Weight: 90.7 kg    Examination:  General: No acute  distress. Cardiovascular: RRR Lungs: unlabored Neurological: Alert and oriented 3. Moves all extremities 4 with equal strength. Cranial nerves II through XII grossly intact. Extremities: LE erythematous improved   Data Reviewed: I have personally reviewed following labs and imaging studies  CBC: Recent Labs  Lab 09/11/22 2022 09/12/22 0402 09/14/22 0456  WBC 8.4 6.7 5.9  NEUTROABS 6.1  --  2.9  HGB 16.1* 15.1* 13.2  HCT 45.6 44.3 39.3  MCV 84.4 85.7 89.3  PLT 198 177 150    Basic Metabolic Panel: Recent Labs  Lab 09/11/22 2215 09/12/22 0402 09/12/22 0813 09/12/22 1502 09/13/22 0409 09/14/22 0854  NA  --  138 135 142 139 138  K  --  <2.0* 2.2* 2.2* 4.1 4.1  CL  --  97* 94* 103 102 104  CO2  --  32 30 32 31 28  GLUCOSE  --  118* 183* 110* 137* 110*  BUN  --  6* <5* <5* 5* 6*  CREATININE  --  0.62 0.63 0.62 0.65 0.64  CALCIUM  --  8.2* 7.6* 8.1* 8.4* 8.5*  MG 1.8 1.7  --   --   --  2.0  PHOS  --   --   --   --   --  3.6    GFR: Estimated Creatinine Clearance: 64.8 mL/min (by C-G formula based on SCr of 0.64 mg/dL).  Liver Function Tests: Recent Labs  Lab 09/11/22 2022 09/14/22 0854  AST 24 17  ALT 15 12  ALKPHOS 87 73  BILITOT 1.6* 0.6  PROT 6.7 6.0*  ALBUMIN 3.3* 2.9*    CBG: No results for input(s): "GLUCAP" in the last 168 hours.   Recent Results (from the past 240 hour(s))  Urine Culture (for pregnant, neutropenic or urologic patients or patients with an indwelling urinary catheter)     Status: Abnormal   Collection Time: 09/12/22 12:24 PM   Specimen: Urine, Clean Catch  Result Value Ref Range Status   Specimen Description   Final    URINE, CLEAN CATCH Performed at [facility], [address] [place], [place] [address]    Special Requests   Final    NONE Performed at [facility], [address] [place], [place] [address]    Culture >=100,000 COLONIES/mL KLEBSIELLA PNEUMONIAE (Denise Kelly)  Final   Report  Status 09/14/2022 FINAL  Final   Organism ID, Bacteria KLEBSIELLA PNEUMONIAE (Denise Kelly)  Final      Susceptibility   Klebsiella pneumoniae - MIC*    AMPICILLIN RESISTANT Resistant     CEFAZOLIN <=4 SENSITIVE Sensitive     CEFEPIME <=0.12 SENSITIVE Sensitive     CEFTRIAXONE <=0.25 SENSITIVE Sensitive     CIPROFLOXACIN <=0.25 SENSITIVE Sensitive     GENTAMICIN <=1 SENSITIVE Sensitive     IMIPENEM <=0.25 SENSITIVE  Sensitive     NITROFURANTOIN 32 SENSITIVE Sensitive     TRIMETH/SULFA <=20 SENSITIVE Sensitive     AMPICILLIN/SULBACTAM 4 SENSITIVE Sensitive     PIP/TAZO <=4 SENSITIVE Sensitive     * >=100,000 COLONIES/mL KLEBSIELLA PNEUMONIAE         Radiology Studies: [place] EKG SITE RITE  Result Date: 09/12/2022 If Site Rite image not attached, placement could not be confirmed due to current cardiac rhythm.       Scheduled Meds:  acetaminophen  650 mg Oral BID   apixaban  5 mg Oral BID   cefadroxil  500 mg Oral BID   cyanocobalamin  1,000 mcg Oral Daily   folic acid  1 mg Oral Daily   gabapentin  100 mg Oral TID   multivitamin with minerals  1 tablet Oral Daily   thiamine  100 mg Oral Daily   Or   thiamine  100 mg Intravenous Daily   Continuous Infusions:  lactated ringers 1,000 mL with potassium chloride 40 mEq infusion 75 mL/hr at 09/14/22 0600     LOS: 3 days    Time spent: over 30 min     Lacretia Nicks, MD [facility]   To contact the attending provider between 7A-7P or the covering provider during after hours 7P-7A, please log into the web site [email or web address] and access using universal [place] password for that web site. If you do not have the password, please call the hospital operator.  09/14/2022, 4:10 PM

## 2022-09-15 DIAGNOSIS — E876 Hypokalemia: Secondary | ICD-10-CM | POA: Diagnosis not present

## 2022-09-15 LAB — CBC WITH DIFFERENTIAL/PLATELET
Abs Immature Granulocytes: 0.02 10*3/uL (ref 0.00–0.07)
Basophils Absolute: 0.1 10*3/uL (ref 0.0–0.1)
Basophils Relative: 1 %
Eosinophils Absolute: 0.3 10*3/uL (ref 0.0–0.5)
Eosinophils Relative: 4 %
HCT: 42.1 % (ref 36.0–46.0)
Hemoglobin: 14 g/dL (ref 12.0–15.0)
Immature Granulocytes: 0 %
Lymphocytes Relative: 42 %
Lymphs Abs: 2.5 10*3/uL (ref 0.7–4.0)
MCH: 29.8 pg (ref 26.0–34.0)
MCHC: 33.3 g/dL (ref 30.0–36.0)
MCV: 89.6 fL (ref 80.0–100.0)
Monocytes Absolute: 0.5 10*3/uL (ref 0.1–1.0)
Monocytes Relative: 9 %
Neutro Abs: 2.6 10*3/uL (ref 1.7–7.7)
Neutrophils Relative %: 44 %
Platelets: 166 10*3/uL (ref 150–400)
RBC: 4.7 MIL/uL (ref 3.87–5.11)
RDW: 14.3 % (ref 11.5–15.5)
WBC: 5.9 10*3/uL (ref 4.0–10.5)
nRBC: 0 % (ref 0.0–0.2)

## 2022-09-15 LAB — COMPREHENSIVE METABOLIC PANEL
ALT: 10 U/L (ref 0–44)
AST: 16 U/L (ref 15–41)
Albumin: 2.8 g/dL — ABNORMAL LOW (ref 3.5–5.0)
Alkaline Phosphatase: 72 U/L (ref 38–126)
Anion gap: 6 (ref 5–15)
BUN: 10 mg/dL (ref 8–23)
CO2: 27 mmol/L (ref 22–32)
Calcium: 8.6 mg/dL — ABNORMAL LOW (ref 8.9–10.3)
Chloride: 105 mmol/L (ref 98–111)
Creatinine, Ser: 0.78 mg/dL (ref 0.44–1.00)
GFR, Estimated: >60 mL/min (ref >60)
Glucose, Bld: 107 mg/dL — ABNORMAL HIGH (ref 70–99)
Potassium: 4.6 mmol/L (ref 3.5–5.1)
Sodium: 138 mmol/L (ref 135–145)
Total Bilirubin: 0.8 mg/dL (ref 0.3–1.2)
Total Protein: 5.7 g/dL — ABNORMAL LOW (ref 6.5–8.1)

## 2022-09-15 LAB — MAGNESIUM: Magnesium: 1.8 mg/dL (ref 1.7–2.4)

## 2022-09-15 LAB — PHOSPHORUS: Phosphorus: 4.1 mg/dL (ref 2.5–4.6)

## 2022-09-15 NOTE — Progress Notes (Signed)
Chaplain engaged in an initial visit with [facility]. Initially, Khamoni voiced that she did not want anything to do with a Chaplain but gradually began talking about her life and showing [employer]. Chaplain worked to offer a compassionate and supportive presence. Mikael shared photos of her cats and specifically her favorite cat that passed about two years ago. Chaplain recognized that Emerald has found a lot of joy and comfort from her cats.   Chaplain worked to also [employer] support system. Meyah stated that she lives in a senior community. She grew up as an only child with a father who passed while she was young. Malae voiced that she learned how to be alone and found solace in the [employer]. When Chaplain asked about any other family, she did voice that she has been married before and had a daughter who passed some time ago. Chaplain recognized that Rubab has been through a significant amount of grief in her life.   When Chaplain asked about her progress with physical therapy and getting up, she voiced "I'll get up when I'm ready." Chaplain could tell that Tniya was resistant to have conversations around progressing physically. When Chaplain worked to think about what brings Jasleene hope and inspiration in the midst of grief and health crises, Gitzel stated that she did not want to talk about hope. Chaplain honored her request.   Chaplain assessed that Xareni has had some significant traumatic experiences that have left her guarded in a number of areas in her life. Chaplain worked to be a safe place for [employer] to share. Chaplain offered reflective listening, appropriate questions around barriers to her care and progress, and support.   Chaplain Delitha Elms MDiv  09/15/22 1300  Spiritual Encounters  Type of Visit Initial  Care provided to: Patient  Referral source Other (comment) (Physical Therapy)  Reason for visit Routine spiritual support

## 2022-09-15 NOTE — TOC Initial Note (Signed)
Transition of Care [facility]) - Initial/Assessment Note    Patient Details  Name: Denise Kelly MRN: [id number] Date of Birth: 06-Feb-1941  Transition of Care [facility]) CM/SW Contact:    Larrie Kass, LCSW Phone Number: 09/15/2022, 11:26 AM  Clinical Narrative:                 CSW spoke with pt's POA/HCPOA [name] to discuss rec for SNF placement. Ms Phillips Climes reports she thinks pt would benefit, she also states pt was placed in rehab at the beginning of this year. CSW explained the process, pt will need insurance authorization. CSW to fax pt out for SNF placement.TOC to follow.    Expected Discharge Plan: Skilled Nursing Facility Barriers to Discharge: Continued Medical Work up   Patient Goals and CMS Choice Patient states their goals for this hospitalization and ongoing recovery are:: POA would like SNf palcement.          Expected Discharge Plan and Services In-house Referral: Clinical Social Work     Living arrangements for the past 2 months: Assisted Living Facility                                      Prior Living Arrangements/Services Living arrangements for the past 2 months: Assisted Living Facility Lives with:: Self Patient language and need for interpreter reviewed:: Yes Do you feel safe going back to the place where you live?: Yes      Need for Family Participation in Patient Care: Yes (Comment) Care giver support system in place?: Yes (comment) Current home services: DME Criminal Activity/Legal Involvement Pertinent to Current Situation/Hospitalization: No - Comment as needed  Activities of Daily Living Home Assistive Devices/Equipment: Walker (specify type) ADL Screening (condition at time of admission) Patient's cognitive ability adequate to safely complete daily activities?: Yes Is the patient deaf or have difficulty hearing?: Yes Does the patient have difficulty seeing, even when wearing glasses/contacts?: No Does the patient have difficulty  concentrating, remembering, or making decisions?: No Patient able to express need for assistance with ADLs?: Yes Does the patient have difficulty dressing or bathing?: Yes Independently performs ADLs?: No Communication: Independent Dressing (OT): Needs assistance Is this a change from baseline?: Pre-admission baseline Grooming: Needs assistance Is this a change from baseline?: Pre-admission baseline Feeding: Independent Bathing: Needs assistance Is this a change from baseline?: Pre-admission baseline Toileting: Needs assistance Is this a change from baseline?: Pre-admission baseline In/Out Bed: Needs assistance Is this a change from baseline?: Pre-admission baseline Walks in Home: Needs assistance Is this a change from baseline?: Pre-admission baseline Does the patient have difficulty walking or climbing stairs?: Yes Weakness of Legs: Both Weakness of Arms/Hands: Both  Permission Sought/Granted                  Emotional Assessment           Psych Involvement: No (comment)  Admission diagnosis:  Hypokalemia [E87.6] Weakness [R53.1] Patient Active Problem List   Diagnosis Date Noted   Bilateral lower extremity pain 05/21/2022   FTT (failure to thrive) in adult 05/01/2022   Chronic diastolic CHF (congestive heart failure) (HCC) 04/30/2022   GERD without esophagitis 04/30/2022   Right hip pain 04/30/2022   Generalized weakness 04/28/2022   Cellulitis 04/20/2022   Lymphedema 04/20/2022   Personal history of thromboembolic disease 40/98/1191   History of pulmonary embolus (PE) 04/20/2022   UTI (urinary tract infection)  01/13/2021   Acute metabolic encephalopathy 01/13/2021   Upper GI bleed 01/23/2018   Leukocytosis 01/23/2018   Physical deconditioning 01/23/2018   Acute pulmonary embolus (HCC) 01/04/2018   Acute encephalopathy 09/28/2017   Constipation, slow transit 09/28/2017   Unsteady gait 04/28/2017   Spondylolisthesis 07/17/2016   Lymphedema of both lower  extremities 07/17/2016   Chronic pain syndrome    Depression 05/23/2016   Chronic back pain 02/25/2016   Lesion of liver 02/25/2016   History of fracture of left hip 11/28/2015   Fall 11/28/2015   RLS (restless legs syndrome) 11/28/2015   Hypokalemia 03/14/2014   Confusion 03/14/2014   Recurrent UTI 03/14/2014   Effusion of right knee 02/22/2014   Alcohol dependence (HCC) 02/22/2014   Substance induced mood disorder (HCC) 02/22/2014   Hepatic encephalopathy (HCC)    Altered mental status 02/21/2014   SIRS (systemic inflammatory response syndrome) (HCC) 02/21/2014   S/P total knee arthroplasty 01/02/2014   Essential hypertension 11/10/2013   LAFB (left anterior fascicular block) 11/10/2013   Obesity, unspecified 11/10/2013   PCP:  Pcp, No Pharmacy:   [id number] [place], [place] - [address] [place] [address] Phone: [phone] Fax: [phone]  [place] - [facility] [address] [place] [address] Phone: [phone] Fax: [phone]  [facility] [place], [place] - [address] [place] [address] Phone: [phone] Fax: [phone]     Social Determinants of Health (SDOH) Social History: SDOH Screenings   Food Insecurity: No Food Insecurity (09/12/2022)  Housing: Low Risk  (09/12/2022)  Transportation Needs: No Transportation Needs (09/12/2022)  Utilities: Not At Risk (09/12/2022)  Depression (PHQ2-9): Low Risk  (01/09/2019)  Tobacco Use: Medium Risk (09/11/2022)   SDOH Interventions:     Readmission Risk Interventions    05/02/2022   11:19 AM  Readmission Risk Prevention Plan  Transportation Screening Complete  Medication Review (RN Care Manager) Complete  PCP or Specialist appointment within 3-5 days of discharge Complete  HRI or Home Care Consult Complete  SW Recovery Care/Counseling Consult Complete  Palliative Care Screening Not  Applicable  Skilled Nursing Facility Complete

## 2022-09-15 NOTE — Care Management Important Message (Signed)
Important Message  Patient Details IM Letter given. Name: Denise Kelly MRN: [id number] Date of Birth: 01/03/1941   Medicare Important Message Given:  Yes     Caren Macadam 09/15/2022, 3:16 PM

## 2022-09-15 NOTE — Progress Notes (Signed)
PROGRESS NOTE    Denise Kelly  EPP:[id number] DOB: 07-01-1940 DOA: 09/11/2022 PCP: Pcp, No  Chief Complaint  Patient presents with   Weakness    Brief Narrative:   Denise Kelly is Denise Kelly 82 y.o. female with medical history significant for history of PE on Eliquis, lymphedema and chronic lower extremity pain who was found in her room covered in diarrhea.  The patient lives in an independent living facility facility and staff went to check on her because she had not been seen in [name] couple of days.  The patient reports that she had terrible diarrhea for approximately 3 days.  But the diarrhea has now resolved.  She has been diarrhea free for the last 2 days and she denies any pain or fever.  Her appetite is at baseline and she has been eating.  She denies any fevers or chest pain. The patient was brought into the emergency department by EMS.  Her workup revealed severe hypokalemia with potassium less than 2.  Assessment & Plan:   Principal Problem:   Hypokalemia Active Problems:   LAFB (left anterior fascicular block)   Alcohol dependence (HCC)   RLS (restless legs syndrome)   Chronic pain syndrome   Lymphedema   History of pulmonary embolus (PE)  Cognitive Impairment  Seems to have some memory issues.  Didn't remember me when I saw her today.  Apparently she lives alone in ILF.  Manages her meds.  Only taking eliquis?  But her recent med list seems to have more medications than just this?  Will need to see how she does with PT/OT -> recommending SNF.  She was found during [name] welfare check.  I think she'll need rehab.  Severe Hypokalemia Likely related to acute diarrheal illness, but possibly also related to chronic diuretic therapy with lasix (increased to BID lasix in March give her LE edema, though she hasn't taken this in 10 days) resolved Goal mag >2   Klebsiella Pneumoniae UTI Covered by duricef She's having dysuria  Diarrhea Seems resolved Will follow, workup if  recurrent  Lower Extremity Cellulitis Likely has venous stasis changes, but erythema and tenderness could represent cellulitis Will treat with duricef x5 days  Lymphedema  Chronic Lower Extremity Pain gabapentin, tylenol (doesn't appear she's taking cymbalta or tramadol) Needs outpatient lymphedema care  Hx Etoh Abuse [place]  Hypertension Not on any meds for this  Hx PE and DVT Eliquis   Not clear to me which medicines she's taking.  The only one she was able to confidently report to me was eliquis.  Per med rec, not taking cymbalta, lasix, melatonin, protonix, miralax, potassium.  Also her last dispense of eliquis was 3/1?  She lives home alone.  Will follow this up with pharmacy.      DVT prophylaxis: eliquis Code Status: full Family Communication: none Disposition:   Status is: Inpatient Remains inpatient appropriate because: continued need for potassium replacement   Consultants:  none  Procedures:  none  Antimicrobials:  Anti-infectives (From admission, onward)    Start     Dose/Rate Route Frequency Ordered Stop   09/12/22 1000  cefadroxil (DURICEF) capsule 500 mg        500 mg Oral 2 times daily 09/12/22 0848 09/19/22 0959       Subjective: No new complaints  Objective: Vitals:   09/14/22 0644 09/14/22 1521 09/14/22 1930 09/15/22 1300  BP: 137/84 (!) 117/99 138/79 126/74  Pulse: 85 81 98 83  Resp: 19 18 20  18  Temp: 97.8 F (36.6 C) 98 F (36.7 C) 97.6 F (36.4 C) 97.6 F (36.4 C)  TempSrc: Oral Oral Oral Oral  SpO2: 94% 98%  96%  Weight:      Height:        Intake/Output Summary (Last 24 hours) at 09/15/2022 1359 Last data filed at 09/15/2022 1251 Gross per 24 hour  Intake 920 ml  Output 1601 ml  Net -681 ml   Filed Weights   09/11/22 1944  Weight: 90.7 kg    Examination:  General: No acute distress. Cardiovascular: RRR Lungs: unlabored Neurological: Alert. Moves all extremities 4 with equal strength. Cranial nerves II through XII  grossly intact. Extremities: No clubbing or cyanosis. No edema.  Data Reviewed: I have personally reviewed following labs and imaging studies  CBC: Recent Labs  Lab 09/11/22 2022 09/12/22 0402 09/14/22 0456 09/15/22 0353  WBC 8.4 6.7 5.9 5.9  NEUTROABS 6.1  --  2.9 2.6  HGB 16.1* 15.1* 13.2 14.0  HCT 45.6 44.3 39.3 42.1  MCV 84.4 85.7 89.3 89.6  PLT 198 177 150 166    Basic Metabolic Panel: Recent Labs  Lab 09/11/22 2215 09/12/22 0402 09/12/22 0813 09/12/22 1502 09/13/22 0409 09/14/22 0854 09/15/22 0353  NA  --  138 135 142 139 138 138  K  --  <2.0* 2.2* 2.2* 4.1 4.1 4.6  CL  --  97* 94* 103 102 104 105  CO2  --  32 30 32 31 28 27   GLUCOSE  --  118* 183* 110* 137* 110* 107*  BUN  --  6* <5* <5* 5* 6* 10  CREATININE  --  0.62 0.63 0.62 0.65 0.64 0.78  CALCIUM  --  8.2* 7.6* 8.1* 8.4* 8.5* 8.6*  MG 1.8 1.7  --   --   --  2.0 1.8  PHOS  --   --   --   --   --  3.6 4.1    GFR: Estimated Creatinine Clearance: 64.8 mL/min (by C-G formula based on SCr of 0.78 mg/dL).  Liver Function Tests: Recent Labs  Lab 09/11/22 2022 09/14/22 0854 09/15/22 0353  AST 24 17 16   ALT 15 12 10   ALKPHOS 87 73 72  BILITOT 1.6* 0.6 0.8  PROT 6.7 6.0* 5.7*  ALBUMIN 3.3* 2.9* 2.8*    CBG: No results for input(s): "GLUCAP" in the last 168 hours.   Recent Results (from the past 240 hour(s))  Urine Culture (for pregnant, neutropenic or urologic patients or patients with an indwelling urinary catheter)     Status: Abnormal   Collection Time: 09/12/22 12:24 PM   Specimen: Urine, Clean Catch  Result Value Ref Range Status   Specimen Description   Final    URINE, CLEAN CATCH Performed at [facility], [address] [place], [place] [address]    Special Requests   Final    NONE Performed at [facility], [address], [place], [place] [address]    Culture >=100,000 COLONIES/mL KLEBSIELLA PNEUMONIAE ([name])  Final   Report Status 09/14/2022  FINAL  Final   Organism ID, Bacteria KLEBSIELLA PNEUMONIAE ([name])  Final      Susceptibility   Klebsiella pneumoniae - MIC*    AMPICILLIN RESISTANT Resistant     CEFAZOLIN <=4 SENSITIVE Sensitive     CEFEPIME <=0.12 SENSITIVE Sensitive     CEFTRIAXONE <=0.25 SENSITIVE Sensitive     CIPROFLOXACIN <=0.25 SENSITIVE Sensitive     GENTAMICIN <=1 SENSITIVE Sensitive  IMIPENEM <=0.25 SENSITIVE Sensitive     NITROFURANTOIN 32 SENSITIVE Sensitive     TRIMETH/SULFA <=20 SENSITIVE Sensitive     AMPICILLIN/SULBACTAM 4 SENSITIVE Sensitive     PIP/TAZO <=4 SENSITIVE Sensitive     * >=100,000 COLONIES/mL KLEBSIELLA PNEUMONIAE         Radiology Studies: No results found.      Scheduled Meds:  acetaminophen  650 mg Oral BID   apixaban  5 mg Oral BID   cefadroxil  500 mg Oral BID   cyanocobalamin  1,000 mcg Oral Daily   folic acid  1 mg Oral Daily   gabapentin  100 mg Oral TID   multivitamin with minerals  1 tablet Oral Daily   thiamine  100 mg Oral Daily   Or   thiamine  100 mg Intravenous Daily   Continuous Infusions:     LOS: 4 days    Time spent: over 30 min     Lacretia Nicks, MD [facility]   To contact the attending provider between 7A-7P or the covering provider during after hours 7P-7A, please log into the web site [email or web address] and access using universal [place] password for that web site. If you do not have the password, please call the hospital operator.  09/15/2022, 1:59 PM

## 2022-09-15 NOTE — Evaluation (Signed)
Occupational Therapy Evaluation Patient Details Name: Denise Kelly MRN: [id number] DOB: [birth year] Today's Date: 09/15/2022   History of Present Illness 82 y.o. female with medical history significant for history of PE on Eliquis, lymphedema, cellulitis, chronic pain syndrome, medical noncompliance, L and R TKAs, lumbar fusion  who was found in her room covered in diarrhea.  The patient lives in an independent living facility facility and staff went to check on her because she had not been seen in a couple of days.  Pt admitted 09/11/22 for severe hypokalemia   Clinical Impression   Pt reports that she was independent in ADL and mobility with RW - that she used communal eating area and has help for cleaning. However, Pt was found in her own stool after not being seen for a few days. Very limited evaluation as Pt could be described as self-limiting. Initially she refused to do any therapy - and continued to refuse OOB activity despite education on importance of demonstration, benefits for respiratory and digestive systems, and proof of ability to move at independent level. Pt still declined OOB participation. She did perform bed level exercises. This therapist has concerns about motivation and mental health (did speak to Chaplain services about talking with her) in addition to cognitive concerns. OT will follow acutely and next session continue to push for OOB as well as potentially assessing cognition for capacity. At this time, she is max A for LB ADL, min to set up for UB ADL and is at the level where she will need post-acute rehab of <3 hours daily to maximize safety and independence in ADL.   Of note: She loves her cat [name] and promised that she would get up with therapy tomorrow if we use the code work :[name]"       Recommendations for follow up therapy are one component of a multi-disciplinary discharge planning process, led by the attending physician.  Recommendations may be updated based on  patient status, additional functional criteria and insurance authorization.   Assistance Recommended at Discharge Frequent or constant Supervision/Assistance  Patient can return home with the following Two people to help with walking and/or transfers;A lot of help with bathing/dressing/bathroom;Assistance with cooking/housework;Direct supervision/assist for medications management;Direct supervision/assist for financial management;Assist for transportation;Help with stairs or ramp for entrance    Functional Status Assessment  Patient has had a recent decline in their functional status and demonstrates the ability to make significant improvements in function in a reasonable and predictable amount of time.  Equipment Recommendations  BSC/3in1;Tub/shower seat    Recommendations for Other Services PT consult;Speech consult (cognition)     Precautions / Restrictions Precautions Precautions: Fall Restrictions Weight Bearing Restrictions: No      Mobility Bed Mobility Overal bed mobility: Needs Assistance Bed Mobility: Rolling Rolling: Mod assist         General bed mobility comments: pt only agreeable to perform partial roll despite attempting to ease fear of falling (rails up and one person on each side); pt assists at her preference so assist was variable; pt refused any other form of activity despite max education, encouragement    Transfers                   General transfer comment: Pt declined despite max education and encouragement      Balance  ADL either performed or assessed with clinical judgement   ADL Overall ADL's : Needs assistance/impaired Eating/Feeding: Set up;Bed level   Grooming: Wash/dry face;Wash/dry hands;Set up;Bed level Grooming Details (indicate cue type and reason): HOB elevated, declined OOB Upper Body Bathing: Moderate assistance Upper Body Bathing Details (indicate cue type and  reason): for back Lower Body Bathing: Maximal assistance;Bed level   Upper Body Dressing : Maximal assistance   Lower Body Dressing: Maximal assistance;Bed level   Toilet Transfer: Maximal assistance Toilet Transfer Details (indicate cue type and reason): bed level, declined OOB desite education and max encouragement Toileting- Clothing Manipulation and Hygiene: Maximal assistance;Bed level       Functional mobility during ADLs:  (declined today desite max education) General ADL Comments: decreased motivation, decreased activity tolerance, pain in BLE and lower back, decreased cognition?!?!     Vision Patient Visual Report: No change from baseline       Perception     Praxis      Pertinent Vitals/Pain Pain Assessment Pain Assessment: 0-10 Pain Score: 8  Pain Location: BLE and lower back Pain Descriptors / Indicators: Guarding, Constant Pain Intervention(s): Repositioned, Limited activity within patient's tolerance, Premedicated before session     Hand Dominance Right   Extremity/Trunk Assessment Upper Extremity Assessment Upper Extremity Assessment: Generalized weakness   Lower Extremity Assessment Lower Extremity Assessment: Generalized weakness   Cervical / Trunk Assessment Cervical / Trunk Assessment:  (Pt says chronic low back pain)   Communication Communication Communication: HOH   Cognition Arousal/Alertness: Awake/alert Behavior During Therapy: Flat affect Overall Cognitive Status: Impaired/Different from baseline Area of Impairment: Safety/judgement, Awareness, Problem solving                         Safety/Judgement: Decreased awareness of safety, Decreased awareness of deficits (or denial) Awareness: Emergent Problem Solving: Slow processing, Decreased initiation, Requires verbal cues General Comments: Would benefit from cognitive assessment. She is able to hold a conversation, and oriented. however I believe that she is refusing to do  certain things and just saying that she can do them because it means that she will have to admit that she CANNOT perform these activities (as a way of avoiding the task) OR she has severe safety awareness and STM deficits. Need her to do more with [place] to really determine root cause     General Comments  Pt promises that she will get OOB with therapy if they use the code word "Wally"    Exercises Exercises: Other exercises Other Exercises Other Exercises: leg abduction from hip x5 each side Other Exercises: leg lifts 5 each side Other Exercises: heel slides 5 each side Other Exercises: shoulder FF from hip to above head x5 each side Other Exercises: BUE punches x10 (2 times)   Shoulder Instructions      Home Living Family/patient expects to be discharged to:: Unsure Living Arrangements: Alone   Type of Home: Independent living facility       Home Layout: One level               Home Equipment: Agricultural consultant (2 wheels)   Additional Comments: patient mentioned that she had some caregiver support for cleaning services and that she gets her meals in the cafeteria. She also loves her cat "Wally"      Prior Functioning/Environment Prior Level of Function : Needs assist       Physical Assist : Mobility (physical);ADLs (physical)     Mobility Comments: uses  walker, reports being very fearful of falling ADLs Comments: Patient reports being independent however found soiled in stool at facility. (patient had help from daughter in past for ADLs and IADLs however daughter committed suicide almost 2 years ago)        OT Problem List: Decreased strength;Decreased activity tolerance;Impaired balance (sitting and/or standing);Decreased cognition;Decreased safety awareness;Decreased knowledge of use of DME or AE;Obesity;Pain;Increased edema      OT Treatment/Interventions: Self-care/ADL training;Therapeutic exercise;Energy conservation;Manual therapy;Therapeutic  activities;Patient/family education;Balance training    OT Goals(Current goals can be found in the care plan section) Acute Rehab OT Goals Patient Stated Goal: get home to see [name] OT Goal Formulation: With patient Time For Goal Achievement: 09/29/22 Potential to Achieve Goals: Fair ADL Goals Pt Will Perform Grooming: with set-up;sitting Pt Will Perform Upper Body Dressing: with set-up;sitting Pt Will Perform Lower Body Dressing: sit to/from stand;with min assist Pt Will Transfer to Toilet: with mod assist;ambulating Pt Will Perform Toileting - Clothing Manipulation and hygiene: with mod assist;sit to/from stand Pt/caregiver will Perform Home Exercise Program: Both right and left upper extremity;With theraband;Independently;With written HEP provided Additional ADL Goal #1: Pt will follow multi-step directions with 80% accuracy for ADL  OT Frequency: Min 2X/week    Co-evaluation              AM-PAC OT "6 Clicks" Daily Activity     Outcome Measure Help from another person eating meals?: A Little Help from another person taking care of personal grooming?: A Little Help from another person toileting, which includes using toliet, bedpan, or urinal?: A Lot Help from another person bathing (including washing, rinsing, drying)?: A Lot Help from another person to put on and taking off regular upper body clothing?: A Lot Help from another person to put on and taking off regular lower body clothing?: Total 6 Click Score: 13   End of Session Nurse Communication: Mobility status;Precautions  Activity Tolerance: Patient limited by pain;Patient limited by fatigue (self-limiting actions) Patient left: in bed;with call bell/phone within reach;with bed alarm set (all 4 rails up by Pt request)  OT Visit Diagnosis: Muscle weakness (generalized) (M62.81);History of falling (Z91.81);Other symptoms and signs involving cognitive function;Adult, failure to thrive (R62.7);Pain Pain - Right/Left: Right  (Bil) Pain - part of body: Leg (and lower back)                Time: 1610-9604 OT Time Calculation (min): 42 min Charges:  OT General Charges $OT Visit: 1 Visit OT Evaluation $OT Eval Moderate Complexity: 1 Mod OT Treatments $Self Care/Home Management : 8-22 mins  Nyoka Cowden OTR/L Acute Rehabilitation Services Office: [phone]  Evern [facility] 09/15/2022, 2:15 PM

## 2022-09-15 NOTE — [place] FL2 (Signed)
[place] MEDICAID FL2 LEVEL OF CARE FORM     IDENTIFICATION  Patient Name: Denise Kelly Birthdate: 02-Aug-1940 Sex: female Admission Date (Current Location): 09/11/2022  [facility] and [place] Number:  Producer, television/film/video and Address:  [facility],  [address] [place], [place] [address]      Provider Number: [id number]  Attending Physician Name and Address:  Zigmund Daniel., *  Relative Name and Phone Number:  Denise Kelly (POA/HCPOA) [phone] [facility] Phone)    Current Level of Care: Hospital Recommended Level of Care: Skilled Nursing Facility Prior Approval Number:    Date Approved/Denied:   PASRR Number: [id number]  Discharge Plan: SNF    Current Diagnoses: Patient Active Problem List   Diagnosis Date Noted   Bilateral lower extremity pain 05/21/2022   FTT (failure to thrive) in adult 05/01/2022   Chronic diastolic CHF (congestive heart failure) (HCC) 04/30/2022   GERD without esophagitis 04/30/2022   Right hip pain 04/30/2022   Generalized weakness 04/28/2022   Cellulitis 04/20/2022   Lymphedema 04/20/2022   Personal history of thromboembolic disease 84/69/6295   History of pulmonary embolus (PE) 04/20/2022   UTI (urinary tract infection) 01/13/2021   Acute metabolic encephalopathy 01/13/2021   Upper GI bleed 01/23/2018   Leukocytosis 01/23/2018   Physical deconditioning 01/23/2018   Acute pulmonary embolus (HCC) 01/04/2018   Acute encephalopathy 09/28/2017   Constipation, slow transit 09/28/2017   Unsteady gait 04/28/2017   Spondylolisthesis 07/17/2016   Lymphedema of both lower extremities 07/17/2016   Chronic pain syndrome    Depression 05/23/2016   Chronic back pain 02/25/2016   Lesion of liver 02/25/2016   History of fracture of left hip 11/28/2015   Fall 11/28/2015   RLS (restless legs syndrome) 11/28/2015   Hypokalemia 03/14/2014   Confusion 03/14/2014   Recurrent UTI 03/14/2014   Effusion of right knee 02/22/2014   Alcohol  dependence (HCC) 02/22/2014   Substance induced mood disorder (HCC) 02/22/2014   Hepatic encephalopathy (HCC)    Altered mental status 02/21/2014   SIRS (systemic inflammatory response syndrome) (HCC) 02/21/2014   S/P total knee arthroplasty 01/02/2014   Essential hypertension 11/10/2013   LAFB (left anterior fascicular block) 11/10/2013   Obesity, unspecified 11/10/2013    Orientation RESPIRATION BLADDER Height & Weight     Self, Time, Situation, Place  Normal Incontinent, External catheter Weight: 200 lb (90.7 kg) Height:  5\' 7"  (170.2 cm)  BEHAVIORAL SYMPTOMS/MOOD NEUROLOGICAL BOWEL NUTRITION STATUS      Continent Diet (regular)  AMBULATORY STATUS COMMUNICATION OF NEEDS Skin   Limited Assist Verbally PU Stage and Appropriate Care (buttocks) PU Stage 1 Dressing: BID                     Personal Care Assistance Level of Assistance  Bathing, Feeding, Dressing Bathing Assistance: Limited assistance Feeding assistance: Independent Dressing Assistance: Limited assistance     Functional Limitations Info  Sight, Hearing, Speech Sight Info: Impaired (glasses) Hearing Info: Impaired Speech Info: Adequate    SPECIAL CARE FACTORS FREQUENCY  PT (By licensed PT), OT (By licensed OT)     PT Frequency: 5 x a week OT Frequency: 5 x a week            Contractures Contractures Info: Not present    Additional Factors Info  Code Status, Allergies Code Status Info: full Allergies Info: Sulfa Antibiotics  Coreg (Carvedilol)  Motrin (Ibuprofen)  Toprol Xl (Metoprolol Tartrate)  Doxycycline Hyclate  Flovent Hfa (Fluticasone)  Statin           Current Medications (09/15/2022):  This is the current hospital active medication list Current Facility-Administered Medications  Medication Dose Route Frequency Provider Last Rate Last Admin   acetaminophen (TYLENOL) tablet 500 mg  500 mg Oral Q6H PRN Buena Irish, MD   500 mg at 09/12/22 1709   Or   acetaminophen (TYLENOL)  suppository 650 mg  650 mg Rectal Q6H PRN Buena Irish, MD       acetaminophen (TYLENOL) tablet 650 mg  650 mg Oral BID Buena Irish, MD   650 mg at 09/15/22 1131   apixaban (ELIQUIS) tablet 5 mg  5 mg Oral BID Buena Irish, MD   5 mg at 09/15/22 1132   cefadroxil (DURICEF) capsule 500 mg  500 mg Oral BID Zigmund Daniel., MD   500 mg at 09/15/22 1132   cyanocobalamin (VITAMIN B12) tablet 1,000 mcg  1,000 mcg Oral Daily Buena Irish, MD   1,000 mcg at 09/15/22 1131   folic acid (FOLVITE) tablet 1 mg  1 mg Oral Daily Buena Irish, MD   1 mg at 09/15/22 1131   gabapentin (NEURONTIN) capsule 100 mg  100 mg Oral TID Buena Irish, MD   100 mg at 09/15/22 1131   hydrOXYzine (ATARAX) tablet 10 mg  10 mg Oral TID PRN Zigmund Daniel., MD   10 mg at 09/13/22 1003   melatonin tablet 3 mg  3 mg Oral QHS PRN Buena Irish, MD   3 mg at 09/12/22 0053   multivitamin with minerals tablet 1 tablet  1 tablet Oral Daily Buena Irish, MD   1 tablet at 09/15/22 1131   ondansetron (ZOFRAN) tablet 4 mg  4 mg Oral Q6H PRN Buena Irish, MD       Or   ondansetron [facility]) injection 4 mg  4 mg Intravenous Q6H PRN Buena Irish, MD   4 mg at 09/11/22 2306   sorbitol 70 % solution 30 mL  30 mL Oral Daily PRN Buena Irish, MD       thiamine (VITAMIN B1) tablet 100 mg  100 mg Oral Daily Buena Irish, MD   100 mg at 09/15/22 1131   Or   thiamine (VITAMIN B1) injection 100 mg  100 mg Intravenous Daily Buena Irish, MD       traZODone (DESYREL) tablet 100 mg  100 mg Oral QHS PRN Buena Irish, MD   100 mg at 09/14/22 2127     Discharge Medications: Please see discharge summary for a list of discharge medications.  Relevant Imaging Results:  Relevant Lab Results:   Additional Information [id number]  Larrie Kass, LCSW

## 2022-09-16 DIAGNOSIS — E876 Hypokalemia: Secondary | ICD-10-CM | POA: Diagnosis not present

## 2022-09-16 LAB — BASIC METABOLIC PANEL
Anion gap: 7 (ref 5–15)
BUN: 13 mg/dL (ref 8–23)
CO2: 26 mmol/L (ref 22–32)
Calcium: 8.6 mg/dL — ABNORMAL LOW (ref 8.9–10.3)
Chloride: 105 mmol/L (ref 98–111)
Creatinine, Ser: 0.9 mg/dL (ref 0.44–1.00)
GFR, Estimated: >60 mL/min (ref >60)
Glucose, Bld: 109 mg/dL — ABNORMAL HIGH (ref 70–99)
Potassium: 4.3 mmol/L (ref 3.5–5.1)
Sodium: 138 mmol/L (ref 135–145)

## 2022-09-16 LAB — MAGNESIUM: Magnesium: 2.1 mg/dL (ref 1.7–2.4)

## 2022-09-16 NOTE — Progress Notes (Signed)
PROGRESS NOTE    Denise Kelly  JXB:[id number] DOB: 1940-10-07 DOA: 09/11/2022 PCP: Pcp, No  Chief Complaint  Patient presents with   Weakness    Brief Narrative:   Denise Kelly is Denise Kelly 82 y.o. female with medical history significant for history of PE on Eliquis, lymphedema and chronic lower extremity pain who was found in her room covered in diarrhea.  The patient lives in an independent living facility facility and staff went to check on her because she had not been seen in Denise Kelly couple of days.  The patient reports that she had terrible diarrhea for approximately 3 days.  She was admitted with severe hypokalemia.  This is improved after replacement.  Discharge pending placement.  See belowf or additional details  Assessment & Plan:   Principal Problem:   Hypokalemia Active Problems:   LAFB (left anterior fascicular block)   Alcohol dependence (HCC)   RLS (restless legs syndrome)   Chronic pain syndrome   Lymphedema   History of pulmonary embolus (PE)  Cognitive Impairment  Seems to have some memory issues.  Didn't remember me when I saw her today.  Apparently she lives alone in ILF.  Manages her meds.  Only taking eliquis per her report, but pharmacy fills don't quite match that.  her recent med list seems to have more medications than just this?  Will need to see how she does with PT/OT -> recommending SNF.  She was found during Denise Kelly welfare check.  I think she'll need rehab.  Severe Hypokalemia Likely related to acute diarrheal illness, but possibly also related to chronic diuretic therapy with lasix (increased to BID lasix in March give her LE edema, though she hasn't taken this in 10 days) resolved Goal mag >2   Klebsiella Pneumoniae UTI Covered by duricef She's having dysuria  Diarrhea Seems resolved Will follow, workup if recurrent  Lower Extremity Cellulitis Likely has venous stasis changes, but erythema and tenderness could represent cellulitis Will treat with  duricef   Lymphedema  Chronic Lower Extremity Pain gabapentin, tylenol (doesn't appear she's taking cymbalta or tramadol) Needs outpatient lymphedema care  Hx Etoh Abuse Ciwa  Hypertension Not on any meds for this  Hx PE and DVT Eliquis   Not clear to me which medicines she's taking.  The only one she was able to confidently report to me was eliquis.  Per med rec, not taking cymbalta, lasix, melatonin, protonix, miralax, potassium.  Also her last dispense of eliquis was 3/1.    DVT prophylaxis: eliquis Code Status: full Family Communication: none Disposition:   Status is: Inpatient Remains inpatient appropriate because: continued need for potassium replacement   Consultants:  none  Procedures:  none  Antimicrobials:  Anti-infectives (From admission, onward)    Start     Dose/Rate Route Frequency Ordered Stop   09/12/22 1000  cefadroxil (DURICEF) capsule 500 mg        500 mg Oral 2 times daily 09/12/22 0848 09/19/22 0959       Subjective: Asks if Denise Kelly will see her today  Objective: Vitals:   09/14/22 0644 09/14/22 1521 09/14/22 1930 09/15/22 1300  BP: 137/84 (!) 117/99 138/79 126/74  Pulse: 85 81 98 83  Resp: 19 18 20 18   Temp: 97.8 F (36.6 C) 98 F (36.7 C) 97.6 F (36.4 C) 97.6 F (36.4 C)  TempSrc: Oral Oral Oral Oral  SpO2: 94% 98%  96%  Weight:      Height:  Intake/Output Summary (Last 24 hours) at 09/15/2022 1359 Last data filed at 09/15/2022 1251 Gross per 24 hour  Intake 920 ml  Output 1601 ml  Net -681 ml   Filed Weights   09/11/22 1944  Weight: 90.7 kg    Examination:  General: No acute distress. Lungs: unlabored Neurological: Alert, pleasantly confused. Moves all extremities 4 with equal strength. Cranial nerves II through XII grossly intact. Extremities: No clubbing or cyanosis. No edema.   Data Reviewed: I have personally reviewed following labs and imaging studies  CBC: Recent Labs  Lab 09/11/22 2022  09/12/22 0402 09/14/22 0456 09/15/22 0353  WBC 8.4 6.7 5.9 5.9  NEUTROABS 6.1  --  2.9 2.6  HGB 16.1* 15.1* 13.2 14.0  HCT 45.6 44.3 39.3 42.1  MCV 84.4 85.7 89.3 89.6  PLT 198 177 150 166    Basic Metabolic Panel: Recent Labs  Lab 09/11/22 2215 09/12/22 0402 09/12/22 0813 09/12/22 1502 09/13/22 0409 09/14/22 0854 09/15/22 0353  NA  --  138 135 142 139 138 138  K  --  <2.0* 2.2* 2.2* 4.1 4.1 4.6  CL  --  97* 94* 103 102 104 105  CO2  --  32 30 32 31 28 27   GLUCOSE  --  118* 183* 110* 137* 110* 107*  BUN  --  6* <5* <5* 5* 6* 10  CREATININE  --  0.62 0.63 0.62 0.65 0.64 0.78  CALCIUM  --  8.2* 7.6* 8.1* 8.4* 8.5* 8.6*  MG 1.8 1.7  --   --   --  2.0 1.8  PHOS  --   --   --   --   --  3.6 4.1    GFR: Estimated Creatinine Clearance: 64.8 mL/min (by C-G formula based on SCr of 0.78 mg/dL).  Liver Function Tests: Recent Labs  Lab 09/11/22 2022 09/14/22 0854 09/15/22 0353  AST 24 17 16   ALT 15 12 10   ALKPHOS 87 73 72  BILITOT 1.6* 0.6 0.8  PROT 6.7 6.0* 5.7*  ALBUMIN 3.3* 2.9* 2.8*    CBG: No results for input(s): "GLUCAP" in the last 168 hours.   Recent Results (from the past 240 hour(s))  Urine Culture (for pregnant, neutropenic or urologic patients or patients with an indwelling urinary catheter)     Status: Abnormal   Collection Time: 09/12/22 12:24 PM   Specimen: Urine, Clean Catch  Result Value Ref Range Status   Specimen Description   Final    URINE, CLEAN CATCH Performed at [facility], [address], [place], [place] [address]    Special Requests   Final    NONE Performed at [facility], [address], [place], [place] [address]    Culture >=100,000 COLONIES/mL KLEBSIELLA PNEUMONIAE (Denise Kelly)  Final   Report Status 09/14/2022 FINAL  Final   Organism ID, Bacteria KLEBSIELLA PNEUMONIAE (Denise Kelly)  Final      Susceptibility   Klebsiella pneumoniae - MIC*    AMPICILLIN RESISTANT Resistant     CEFAZOLIN <=4 SENSITIVE  Sensitive     CEFEPIME <=0.12 SENSITIVE Sensitive     CEFTRIAXONE <=0.25 SENSITIVE Sensitive     CIPROFLOXACIN <=0.25 SENSITIVE Sensitive     GENTAMICIN <=1 SENSITIVE Sensitive     IMIPENEM <=0.25 SENSITIVE Sensitive     NITROFURANTOIN 32 SENSITIVE Sensitive     TRIMETH/SULFA <=20 SENSITIVE Sensitive     AMPICILLIN/SULBACTAM 4 SENSITIVE Sensitive     PIP/TAZO <=4 SENSITIVE Sensitive     * >=  100,000 COLONIES/mL KLEBSIELLA PNEUMONIAE         Radiology Studies: No results found.      Scheduled Meds:  acetaminophen  650 mg Oral BID   apixaban  5 mg Oral BID   cefadroxil  500 mg Oral BID   cyanocobalamin  1,000 mcg Oral Daily   folic acid  1 mg Oral Daily   gabapentin  100 mg Oral TID   multivitamin with minerals  1 tablet Oral Daily   thiamine  100 mg Oral Daily   Or   thiamine  100 mg Intravenous Daily   Continuous Infusions:     LOS: 4 days    Time spent: over 30 min     Lacretia Nicks, MD [facility]   To contact the attending provider between 7A-7P or the covering provider during after hours 7P-7A, please log into the web site [email or web address] and access using universal [place] password for that web site. If you do not have the password, please call the hospital operator.  09/15/2022, 1:59 PM

## 2022-09-17 DIAGNOSIS — E876 Hypokalemia: Secondary | ICD-10-CM | POA: Diagnosis not present

## 2022-09-17 LAB — COMPREHENSIVE METABOLIC PANEL
ALT: 14 U/L (ref 0–44)
AST: 19 U/L (ref 15–41)
Albumin: 2.9 g/dL — ABNORMAL LOW (ref 3.5–5.0)
Alkaline Phosphatase: 74 U/L (ref 38–126)
Anion gap: 7 (ref 5–15)
BUN: 14 mg/dL (ref 8–23)
CO2: 28 mmol/L (ref 22–32)
Calcium: 8.6 mg/dL — ABNORMAL LOW (ref 8.9–10.3)
Chloride: 104 mmol/L (ref 98–111)
Creatinine, Ser: 0.63 mg/dL (ref 0.44–1.00)
GFR, Estimated: >60 mL/min (ref >60)
Glucose, Bld: 112 mg/dL — ABNORMAL HIGH (ref 70–99)
Potassium: 4.7 mmol/L (ref 3.5–5.1)
Sodium: 139 mmol/L (ref 135–145)
Total Bilirubin: 0.6 mg/dL (ref 0.3–1.2)
Total Protein: 6 g/dL — ABNORMAL LOW (ref 6.5–8.1)

## 2022-09-17 LAB — CBC WITH DIFFERENTIAL/PLATELET
Abs Immature Granulocytes: 0.01 10*3/uL (ref 0.00–0.07)
Basophils Absolute: 0.1 10*3/uL (ref 0.0–0.1)
Basophils Relative: 1 %
Eosinophils Absolute: 0.3 10*3/uL (ref 0.0–0.5)
Eosinophils Relative: 5 %
HCT: 43.1 % (ref 36.0–46.0)
Hemoglobin: 14.2 g/dL (ref 12.0–15.0)
Immature Granulocytes: 0 %
Lymphocytes Relative: 40 %
Lymphs Abs: 2.3 10*3/uL (ref 0.7–4.0)
MCH: 29.7 pg (ref 26.0–34.0)
MCHC: 32.9 g/dL (ref 30.0–36.0)
MCV: 90.2 fL (ref 80.0–100.0)
Monocytes Absolute: 0.6 10*3/uL (ref 0.1–1.0)
Monocytes Relative: 9 %
Neutro Abs: 2.6 10*3/uL (ref 1.7–7.7)
Neutrophils Relative %: 45 %
Platelets: 172 10*3/uL (ref 150–400)
RBC: 4.78 MIL/uL (ref 3.87–5.11)
RDW: 14.6 % (ref 11.5–15.5)
WBC: 5.8 10*3/uL (ref 4.0–10.5)
nRBC: 0 % (ref 0.0–0.2)

## 2022-09-17 LAB — MAGNESIUM: Magnesium: 2 mg/dL (ref 1.7–2.4)

## 2022-09-17 LAB — PHOSPHORUS: Phosphorus: 4.4 mg/dL (ref 2.5–4.6)

## 2022-09-17 NOTE — TOC Progression Note (Signed)
Transition of Care [facility]) - Progression Note    Patient Details  Name: [name] MRN: [id number] Date of Birth: 03-03-1941  Transition of Care [facility]) CM/SW Contact  Larrie Kass, LCSW Phone Number: 09/17/2022, 1:26 PM  Clinical Narrative:    CSW spoke with Ms Phillips Climes pt's HCPOA and presented bed offers , she is requesting time to review. TOC to follow.    Expected Discharge Plan: Skilled Nursing Facility Barriers to Discharge: Continued Medical Work up  Expected Discharge Plan and Services In-house Referral: Clinical Social Work     Living arrangements for the past 2 months: Assisted Living Facility                                       Social Determinants of Health (SDOH) Interventions SDOH Screenings   Food Insecurity: No Food Insecurity (09/12/2022)  Housing: Low Risk  (09/12/2022)  Transportation Needs: No Transportation Needs (09/12/2022)  Utilities: Not At Risk (09/12/2022)  Depression (PHQ2-9): Low Risk  (01/09/2019)  Tobacco Use: Medium Risk (09/11/2022)    Readmission Risk Interventions    05/02/2022   11:19 AM  Readmission Risk Prevention Plan  Transportation Screening Complete  Medication Review (RN Care Manager) Complete  PCP or Specialist appointment within 3-5 days of discharge Complete  HRI or Home Care Consult Complete  SW Recovery Care/Counseling Consult Complete  Palliative Care Screening Not Applicable  Skilled Nursing Facility Complete

## 2022-09-17 NOTE — Progress Notes (Signed)
PROGRESS NOTE    Denise Kelly  ZOX:[id number] DOB: June 05, 1940 DOA: 09/11/2022 PCP: Pcp, No  Chief Complaint  Patient presents with   Weakness    Brief Narrative:   82 year old white female ILF dwelling  HF PEF 60-65% Prior pulmonary embolism and DVT on Eliquis-submassive PE 12/2017 Hypodensity 1.6 cm right thyroid lobe Lymphedema--- chronic bilateral lower extremity pain followed by Dr. Pascal Lux (ABI and other workup -8/17) Prior L TKR, L THR previously on suppressive therapy on Keflex followed by infectious disease L4-L5 decompressive laminectomy 07/2011 Dr. Wynetta Emery note Bipolar HTN Restless leg syndrome UGIB while on Eliquis 2019--endoscopy 2019 diminutive MW tear with gastric ulcer that was clipped and placed on Protonix  Staff went to check on her at her ILF as she had not been seen for couple of days she had terrible diarrhea appetite is at baseline workup was revealing for severe hypokalemia with potassium less than 2  Discharge pending placement.   Assessment & Plan:   Principal Problem:   Hypokalemia Active Problems:   LAFB (left anterior fascicular block)   Alcohol dependence (HCC)   RLS (restless legs syndrome)   Chronic pain syndrome   Lymphedema   History of pulmonary embolus (PE)  Cognitive Impairment  Seems to have some memory issues.  Therapy recommends skilled placement given cognitive impairments--she also has polypharmacy that requires skilled attention to management  Severe Hypokalemia Likely related to acute diarrheal illness, but possibly also related to chronic diuretic therapy with lasix (increased to BID lasix in March give her LE edema, though she hasn't taken this in 10 days) resolved Goal mag >2  Periodic labs  Klebsiella Pneumoniae UTI Covered by duricef She's having dysuria  Diarrhea Seems resolved  Lower Extremity Cellulitis Likely has venous stasis changes, but erythema and tenderness could represent cellulitis Received a total of 6  days of antibiotics and we will discontinue--- I am not can we state that she has a bad cellulitis  Lymphedema  Chronic Lower Extremity Pain gabapentin, tylenol from prior to admission continued (doesn't appear she's taking cymbalta or tramadol) Needs outpatient lymphedema care  Hx Etoh Abuse Ciwa has been negative for several days so we will discontinue  Hypertension Not on any meds for this  Hx PE and DVT Eliquis 5 twice daily will need to be on this lifelong  High risk polypharmacy not clear to me which medicines she's taking.  The only one she was able to confidently report to me was eliquis.  Per med rec, not taking cymbalta, lasix, melatonin, protonix, miralax, potassium.  Also her last dispense of eliquis was 3/1.    DVT prophylaxis: eliquis Code Status: full Family Communication: none Disposition:   Status is: Inpatient  Consultants:  none  Procedures:  none  Antimicrobials:  Anti-infectives (From admission, onward)    Start     Dose/Rate Route Frequency Ordered Stop   09/12/22 1000  cefadroxil (DURICEF) capsule 500 mg        500 mg Oral 2 times daily 09/12/22 0848 09/19/22 0959       Subjective:  Pleasant coherent no distress No fever chills nausea vomiting  Objective: Vitals:   09/16/22 0452 09/16/22 2121 09/17/22 0631 09/17/22 1415  BP: 115/80 (!) 158/87 110/68 113/70  Pulse: 86 91 80 92  Resp: 19 18 17  (!) 22  Temp: (!) 97.5 F (36.4 C) 98.5 F (36.9 C) 98.3 F (36.8 C) 97.6 F (36.4 C)  TempSrc: Oral Oral Oral Oral  SpO2: 96% 96% 98% 98%  Weight:      Height:        Intake/Output Summary (Last 24 hours) at 09/17/2022 1745 Last data filed at 09/17/2022 1419 Gross per 24 hour  Intake 720 ml  Output 2100 ml  Net -1380 ml    Filed Weights   09/11/22 1944  Weight: 90.7 kg    Examination:  EOMI [place] AT no focal deficit No icterus no pallor S1-S2 sinus rhythm Abdomen soft no rebound Some excoriations over the outer part of her calves  however does not have any redness or warmth No chills no rigor  Data Reviewed: I have personally reviewed following labs and imaging studies  CBC: Recent Labs  Lab 09/11/22 2022 09/12/22 0402 09/14/22 0456 09/15/22 0353 09/17/22 0427  WBC 8.4 6.7 5.9 5.9 5.8  NEUTROABS 6.1  --  2.9 2.6 2.6  HGB 16.1* 15.1* 13.2 14.0 14.2  HCT 45.6 44.3 39.3 42.1 43.1  MCV 84.4 85.7 89.3 89.6 90.2  PLT 198 177 150 166 172     Basic Metabolic Panel: Recent Labs  Lab 09/12/22 0402 09/12/22 0813 09/13/22 0409 09/14/22 0854 09/15/22 0353 09/16/22 0429 09/17/22 0427  NA 138   < > 139 138 138 138 139  K <2.0*   < > 4.1 4.1 4.6 4.3 4.7  CL 97*   < > 102 104 105 105 104  CO2 32   < > 31 28 27 26 28   GLUCOSE 118*   < > 137* 110* 107* 109* 112*  BUN 6*   < > 5* 6* 10 13 14   CREATININE 0.62   < > 0.65 0.64 0.78 0.90 0.63  CALCIUM 8.2*   < > 8.4* 8.5* 8.6* 8.6* 8.6*  MG 1.7  --   --  2.0 1.8 2.1 2.0  PHOS  --   --   --  3.6 4.1  --  4.4   < > = values in this interval not displayed.     GFR: Estimated Creatinine Clearance: 64.8 mL/min (by C-G formula based on SCr of 0.63 mg/dL).  Liver Function Tests: Recent Labs  Lab 09/11/22 2022 09/14/22 0854 09/15/22 0353 09/17/22 0427  AST 24 17 16 19   ALT 15 12 10 14   ALKPHOS 87 73 72 74  BILITOT 1.6* 0.6 0.8 0.6  PROT 6.7 6.0* 5.7* 6.0*  ALBUMIN 3.3* 2.9* 2.8* 2.9*     CBG: No results for input(s): "GLUCAP" in the last 168 hours.   Recent Results (from the past 240 hour(s))  Urine Culture (for pregnant, neutropenic or urologic patients or patients with an indwelling urinary catheter)     Status: Abnormal   Collection Time: 09/12/22 12:24 PM   Specimen: Urine, Clean Catch  Result Value Ref Range Status   Specimen Description   Final    URINE, CLEAN CATCH Performed at [facility], [address], [place], [place] [address]    Special Requests   Final    NONE Performed at [facility], [address] [place], [place] [address]    Culture >=100,000 COLONIES/mL KLEBSIELLA PNEUMONIAE (A)  Final   Report Status 09/14/2022 FINAL  Final   Organism ID, Bacteria KLEBSIELLA PNEUMONIAE (A)  Final      Susceptibility   Klebsiella pneumoniae - MIC*    AMPICILLIN RESISTANT Resistant     CEFAZOLIN <=4 SENSITIVE Sensitive     CEFEPIME <=0.12 SENSITIVE Sensitive     CEFTRIAXONE <=0.25 SENSITIVE Sensitive     CIPROFLOXACIN <=0.25  SENSITIVE Sensitive     GENTAMICIN <=1 SENSITIVE Sensitive     IMIPENEM <=0.25 SENSITIVE Sensitive     NITROFURANTOIN 32 SENSITIVE Sensitive     TRIMETH/SULFA <=20 SENSITIVE Sensitive     AMPICILLIN/SULBACTAM 4 SENSITIVE Sensitive     PIP/TAZO <=4 SENSITIVE Sensitive     * >=100,000 COLONIES/mL KLEBSIELLA PNEUMONIAE         Radiology Studies: No results found.      Scheduled Meds:  acetaminophen  650 mg Oral BID   apixaban  5 mg Oral BID   cefadroxil  500 mg Oral BID   cyanocobalamin  1,000 mcg Oral Daily   folic acid  1 mg Oral Daily   gabapentin  100 mg Oral TID   multivitamin with minerals  1 tablet Oral Daily   thiamine  100 mg Oral Daily   Or   thiamine  100 mg Intravenous Daily   Continuous Infusions:     LOS: 6 days    Time spent: over 30 min     Rhetta Mura, MD [facility]   To contact the attending provider between 7A-7P or the covering provider during after hours 7P-7A, please log into the web site [email or web address] and access using universal [place] password for that web site. If you do not have the password, please call the hospital operator.  09/17/2022, 5:45 PM

## 2022-09-17 NOTE — Progress Notes (Signed)
OT Cancellation Note  Patient Details Name: Denise Kelly MRN: [id number] DOB: 1940-05-15   Cancelled Treatment:    Reason Eval/Treat Not Completed: Patient declined, no reason specified. Upon introducing self to pt, pt stand "oh just leave me alone, go away" OT attempted to educate pt on the importance/benefits of being OOB/mobility, then pt became agitated, raised her voice and stated "just leave me alone, in might not get out of bed today at all!"  Galen Manila 09/17/2022, 10:26 AM

## 2022-09-17 NOTE — Progress Notes (Signed)
Physical Therapy Treatment Patient Details Name: Denise Kelly MRN: [id number] DOB: 1941/01/07 Today's Date: 09/17/2022   History of Present Illness 82 y.o. female with medical history significant for history of PE on Eliquis, lymphedema, cellulitis, chronic pain syndrome, medical noncompliance, L and R TKAs, lumbar fusion  who was found in her room covered in diarrhea.  The patient lives in an independent living facility facility and staff went to check on her because she had not been seen in a couple of days.  Pt admitted 09/11/22 for severe hypokalemia    PT Comments  Pt refused all mobility on today despite max encouragement. She did perform a few bed level exercises upon request. Will continue to follow and progress activity as pt will allow.      Assistance Recommended at Discharge Frequent or constant Supervision/Assistance  If plan is discharge home, recommend the following:  Can travel by private vehicle    A lot of help with walking and/or transfers;A lot of help with bathing/dressing/bathroom;Assistance with cooking/housework;Assist for transportation;Direct supervision/assist for medications management;Help with stairs or ramp for entrance;Direct supervision/assist for financial management   No  Equipment Recommendations  None recommended by PT    Recommendations for Other Services       Precautions / Restrictions Precautions Precautions: Fall Restrictions Weight Bearing Restrictions: No     Mobility  Bed Mobility               General bed mobility comments: NT-pt refused. She did perform a few LE bed exercises    Transfers                        Ambulation/Gait                   Stairs             Wheelchair Mobility     Tilt Bed    Modified Rankin (Stroke Patients Only)       Balance                                            Cognition Arousal/Alertness: Awake/alert Behavior During Therapy:  Flat affect Overall Cognitive Status: Impaired/Different from baseline                                 General Comments: patient was easily irritated during session with patient not cooperating at start with patient cotinuously confused with reports that she wants to transition home.        Exercises General Exercises - Lower Extremity Ankle Circles/Pumps: AROM, Both, 20 reps, Supine Quad Sets: AROM, Both, 10 reps, Supine Heel Slides: AROM, 5 reps, Supine    General Comments        Pertinent Vitals/Pain Pain Assessment Pain Assessment: Faces Faces Pain Scale: Hurts a little bit Pain Location: BLE and lower back Pain Descriptors / Indicators: Guarding, Constant Pain Intervention(s): Monitored during session, Limited activity within patient's tolerance    Home Living                          Prior Function            PT Goals (current goals can now be found in the care plan section)  Frequency    Min 1X/week      PT Plan Current plan remains appropriate    Co-evaluation              AM-PAC PT "6 Clicks" Mobility   Outcome Measure  Help needed turning from your back to your side while in a flat bed without using bedrails?: A Lot Help needed moving from lying on your back to sitting on the side of a flat bed without using bedrails?: Total Help needed moving to and from a bed to a chair (including a wheelchair)?: Total Help needed standing up from a chair using your arms (e.g., wheelchair or bedside chair)?: Total Help needed to walk in hospital room?: Total Help needed climbing 3-5 steps with a railing? : Total 6 Click Score: 7    End of Session   Activity Tolerance: Patient tolerated treatment well Patient left: in bed;with call bell/phone within reach;with bed alarm set   PT Visit Diagnosis: Muscle weakness (generalized) (M62.81)     Time: 1431-1450 PT Time Calculation (min) (ACUTE ONLY): 19 min  Charges:     $Therapeutic Exercise: 8-22 mins PT General Charges $$ ACUTE PT VISIT: 1 Visit                         Faye Ramsay, PT Acute Rehabilitation  Office: [phone]

## 2022-09-17 NOTE — Progress Notes (Signed)
Occupational Therapy Treatment Patient Details Name: Denise Kelly MRN: [id number] DOB: 13-Feb-1941 Today's Date: 09/17/2022   History of present illness 82 y.o. female with medical history significant for history of PE on Eliquis, lymphedema, cellulitis, chronic pain syndrome, medical noncompliance, L and R TKAs, lumbar fusion  who was found in her room covered in diarrhea.  The patient lives in an independent living facility facility and staff went to check on her because she had not been seen in a couple of days.  Pt admitted 09/11/22 for severe hypokalemia   OT comments  Patient was educated countless times on participation in time out of bed to maintain strength. Patient would endorse wanting to maintain strength but then declined to participate in movement out of bed. Patient did engage in BLE exercises and reminders were added to phone for patient to continue to do BLE exercises x2 a day at 10 am and 6 pm per pt request. Phone was modified to add larger battery monitoring widget per patient request to ensure patient could participate in communications with [facility]. Patient's discharge plan remains appropriate at this time. OT will continue to follow acutely.     Recommendations for follow up therapy are one component of a multi-disciplinary discharge planning process, led by the attending physician.  Recommendations may be updated based on patient status, additional functional criteria and insurance authorization.    Assistance Recommended at Discharge Frequent or constant Supervision/Assistance  Patient can return home with the following  Two people to help with walking and/or transfers;A lot of help with bathing/dressing/bathroom;Assistance with cooking/housework;Direct supervision/assist for medications management;Direct supervision/assist for financial management;Assist for transportation;Help with stairs or ramp for entrance   Equipment Recommendations  BSC/3in1;Tub/shower seat     Recommendations for Other Services      Precautions / Restrictions Precautions Precautions: Fall Restrictions Weight Bearing Restrictions: No       Mobility Bed Mobility                    Transfers                         Balance                                           ADL either performed or assessed with clinical judgement   ADL Overall ADL's : Needs assistance/impaired                                       General ADL Comments: fluctuating back and forth with reporting on going home (apartment) or home (ALF) with inconsistencies. patient was able to participate in bed level BLE movements with reminders added to phone for 10 AM and 6 PM daily. patient reportd having difficulties with seeing battery charge on phone with phone size modified and widget added to screen to show battery life in bigger size. patient was educaetd multiple times about getting out of bed and importance of movement. patient reported she knew and that she was not doing it.    Extremity/Trunk Assessment              Vision       Perception     Praxis      Cognition Arousal/Alertness: Awake/alert Behavior During Therapy:  Flat affect Overall Cognitive Status: Impaired/Different from baseline                                 General Comments: patient was easily irritated during session with patient not cooperating at start with patient cotinuously confused with reports that she wants to transition home.        Exercises      Shoulder Instructions       General Comments      Pertinent Vitals/ Pain       Pain Assessment Pain Assessment: Faces Faces Pain Scale: Hurts a little bit Pain Location: BLE and lower back Pain Descriptors / Indicators: Guarding, Constant Pain Intervention(s): Monitored during session  Home Living                                          Prior Functioning/Environment               Frequency  Min 2X/week        Progress Toward Goals  OT Goals(current goals can now be found in the care plan section)  Progress towards OT goals: Not progressing toward goals - comment (self limiting)     Plan Discharge plan remains appropriate        AM-PAC OT "6 Clicks" Daily Activity     Outcome Measure   Help from another person eating meals?: A Little Help from another person taking care of personal grooming?: A Little Help from another person toileting, which includes using toliet, bedpan, or urinal?: A Lot Help from another person bathing (including washing, rinsing, drying)?: A Lot Help from another person to put on and taking off regular upper body clothing?: A Lot Help from another person to put on and taking off regular lower body clothing?: Total 6 Click Score: 13    End of Session    OT Visit Diagnosis: Muscle weakness (generalized) (M62.81);History of falling (Z91.81);Other symptoms and signs involving cognitive function;Adult, failure to thrive (R62.7);Pain   Activity Tolerance Patient limited by pain;Treatment limited secondary to agitation   Patient Left in bed;with call bell/phone within reach;with bed alarm set   Nurse Communication          Time: 1431-1450 OT Time Calculation (min): 19 min  Charges: OT General Charges $OT Visit: 1 Visit  Rosalio Loud, MS Acute Rehabilitation Department Office# [phone]   Selinda Flavin 09/17/2022, 3:44 PM

## 2022-09-18 DIAGNOSIS — E876 Hypokalemia: Secondary | ICD-10-CM | POA: Diagnosis not present

## 2022-09-18 NOTE — Progress Notes (Signed)
PHYSICAL THERAPY  Pt appears AxO x 3 asking for a Band Aide.  She knew she was in the hospital but unable to recall how she got here.  She resides at [facility] at [facility].  She declined MULTIPLE attempt to get OOB.  I offered her the bathroom, the recliner and even offered to change her bed.  "I'm NOT getting out of the bed".  Pt did c/o "My Ass hurts" so offered to reposition.  "Don't touch me" stated pt.    Pt has been evaluated with rec for SNF.  Will continue to attempt.  Felecia Shelling  PTA Acute  Rehabilitation Services Office M-F          [phone]

## 2022-09-18 NOTE — Care Management Important Message (Signed)
Important Message  Patient Details IM Letter given. Name: CHRISTIA MONJE MRN: [id number] Date of Birth: 28-May-1940   Medicare Important Message Given:  Yes     Caren Macadam 09/18/2022, 11:47 AM

## 2022-09-18 NOTE — Progress Notes (Signed)
PROGRESS NOTE Denise Kelly  ZOX:[id number] DOB: 1941/01/21 DOA: 09/11/2022 PCP: Pcp, No  Brief Narrative:   82 year old white female ILF dwelling  HF PEF 60-65% Prior pulmonary embolism and DVT on Eliquis-submassive PE 12/2017 Hypodensity 1.6 cm right thyroid lobe Lymphedema--- chronic bilateral lower extremity pain followed by Dr. Pascal Lux (ABI and other workup -8/17) Prior L TKR, L THR previously on suppressive therapy on Keflex followed by infectious disease L4-L5 decompressive laminectomy 07/2011 Dr. Wynetta Emery note Bipolar HTN Restless leg syndrome UGIB while on Eliquis 2019--endoscopy 2019 diminutive MW tear with gastric ulcer that was clipped and placed on Protonix  Staff went to check on her at her ILF as she had not been seen for couple of days she had terrible diarrhea appetite is at baseline workup was revealing for severe hypokalemia with potassium less than 2  Discharge pending placement.   Assessment & Plan:   Principal Problem:   Hypokalemia Active Problems:   LAFB (left anterior fascicular block)   Alcohol dependence (HCC)   RLS (restless legs syndrome)   Chronic pain syndrome   Lymphedema   History of pulmonary embolus (PE)  Cognitive Impairment , possibly 2/2 Polypharmacy Mild memory issues.  Therapy recommends skilled placement --also has polypharmacy that requires skilled attention to med management  Severe Hypokalemia Likely related to acute diarrheal illness, but possibly also related to chronic diuretic therapy with lasix (increased to BID lasix in March give her LE edema, though she hasn't taken this in 10 days) resolved Goal mag >2  Periodic labs  Klebsiella Pneumoniae UTI Covered by duricef She's having dysuria which has since resolved  Diarrhea Seems resolved  Lower Extremity Cellulitis Likely has venous stasis changes, but erythema and tenderness could represent cellulitis Received a total of 6 days,and improved  Lymphedema  Chronic Lower  Extremity Pain gabapentin, tylenol from prior to admission continued (doesn't appear she's taking cymbalta or tramadol) Needs outpatient lymphedema care  Hx Etoh Abuse Ciwa has been negative , d'c protocol 7/3  Hypertension Not on any meds for this  Hx PE and DVT Eliquis 5 twice daily will need to be on this lifelong  High risk polypharmacy ?which medicines she's taking.  The only one she was able to confidently report to me was eliquis.  Will need skilled med management  DVT prophylaxis: eliquis Code Status: full Family Communication: none Disposition:   Status is: Inpatient  Subjective:  Pleasant coherent no distress No fever chills nausea vomiting Asking to "go home" Explained that patient will need to mobilize--doesn't appear to have done so today oob  Objective: Vitals:   09/17/22 0631 09/17/22 1415 09/17/22 2048 09/18/22 0515  BP: 110/68 113/70 118/72 119/70  Pulse: 80 92 88 90  Resp: 17 (!) 22 18 18   Temp: 98.3 F (36.8 C) 97.6 F (36.4 C) 97.8 F (36.6 C) 97.8 F (36.6 C)  TempSrc: Oral Oral Oral Oral  SpO2: 98% 98% 94% 96%  Weight:      Height:        Filed Weights   09/11/22 1944  Weight: 90.7 kg    Examination:  Awake coherent in nad Abd soft nt nd no rebound Cta b S1 s2 no m/r/g  Data Reviewed: I have personally reviewed following labs and imaging studies  CBC: Recent Labs  Lab 09/11/22 2022 09/12/22 0402 09/14/22 0456 09/15/22 0353 09/17/22 0427  WBC 8.4 6.7 5.9 5.9 5.8  NEUTROABS 6.1  --  2.9 2.6 2.6  HGB 16.1* 15.1* 13.2 14.0 14.2  HCT 45.6 44.3 39.3 42.1 43.1  MCV 84.4 85.7 89.3 89.6 90.2  PLT 198 177 150 166 172     Basic Metabolic Panel: Recent Labs  Lab 09/12/22 0402 09/12/22 0813 09/13/22 0409 09/14/22 0854 09/15/22 0353 09/16/22 0429 09/17/22 0427  NA 138   < > 139 138 138 138 139  K <2.0*   < > 4.1 4.1 4.6 4.3 4.7  CL 97*   < > 102 104 105 105 104  CO2 32   < > 31 28 27 26 28   GLUCOSE 118*   < > 137* 110*  107* 109* 112*  BUN 6*   < > 5* 6* 10 13 14   CREATININE 0.62   < > 0.65 0.64 0.78 0.90 0.63  CALCIUM 8.2*   < > 8.4* 8.5* 8.6* 8.6* 8.6*  MG 1.7  --   --  2.0 1.8 2.1 2.0  PHOS  --   --   --  3.6 4.1  --  4.4   < > = values in this interval not displayed.    Scheduled Meds:  acetaminophen  650 mg Oral BID   apixaban  5 mg Oral BID   cyanocobalamin  1,000 mcg Oral Daily   gabapentin  100 mg Oral TID   Continuous Infusions:   LOS: 7 days   Time spent: 15 min  Rhetta Mura, MD [facility] 09/18/2022, 4:50 PM

## 2022-09-19 DIAGNOSIS — E876 Hypokalemia: Secondary | ICD-10-CM | POA: Diagnosis not present

## 2022-09-19 NOTE — Progress Notes (Signed)
PT Cancellation Note  Patient Details Name: Denise Kelly MRN: [id number] DOB: Feb 18, 1941   Cancelled Treatment:    Reason Eval/Treat Not Completed: Patient declined, no reason specified (pt adamantly refused all mobility, when asked her reason she stated, "because hell is freezing over. "  Will follow.)   Tamala Ser PT 09/19/2022  Acute Rehabilitation Services  Office [phone]

## 2022-09-19 NOTE — Progress Notes (Signed)
PROGRESS NOTE Denise Kelly  ONG:[id number] DOB: Sep 01, 1940 DOA: 09/11/2022 PCP: Pcp, No  Brief Narrative:   82 year old white female ILF dwelling  HF PEF 60-65% Prior pulmonary embolism and DVT on Eliquis-submassive PE 12/2017 Hypodensity 1.6 cm right thyroid lobe Lymphedema--- chronic bilateral lower extremity pain followed by Dr. Pascal Kelly (ABI and other workup -8/17) Prior L TKR, L THR previously on suppressive therapy on Keflex followed by infectious disease L4-L5 decompressive laminectomy 07/2011 Dr. Wynetta Kelly note Bipolar HTN Restless leg syndrome UGIB while on Eliquis 2019--endoscopy 2019 diminutive MW tear with gastric ulcer that was clipped and placed on Protonix  Staff went to check on her at her ILF as she had not been seen for couple of days she had terrible diarrhea appetite is at baseline workup was revealing for severe hypokalemia with potassium less than 2  Discharge pending placement.   Assessment & Plan:   Principal Problem:   Hypokalemia Active Problems:   LAFB (left anterior fascicular block)   Alcohol dependence (HCC)   RLS (restless legs syndrome)   Chronic pain syndrome   Lymphedema   History of pulmonary embolus (PE)  Cognitive Impairment , possibly 2/2 Polypharmacy Mild memory issues.  Therapy recommends skilled placement --also has polypharmacy that requires skilled attention to med management  Severe Hypokalemia 2/2 acute diarrheal illness,? chronic diuretic therapy with lasix (increased to BID lasix in March give her LE edema, though she hasn't taken this in 10 days) Resolved--Goal mag >2 --Periodic labs  Klebsiella Pneumoniae UTI Rx prior with duricef She's having dysuria which has since resolved  Diarrhea Seems resolved  Lower Extremity Cellulitis Likely has venous stasis changes? represent cellulitis Received a total of 6 days,and improved  Lymphedema  Chronic Lower Extremity Pain gabapentin, tylenol from prior to admission continued (not  taking cymbalta or tramadol) Needs outpatient lymphedema care  Hx Etoh Abuse Ciwa has been negative , d'c protocol 7/3  Hypertension Not on any meds for this--minimize meds and conisder clonidine as a prn in the OP setting  Hx PE and DVT Eliquis 5 twice daily will need to be on this lifelong  High risk polypharmacy ?which medicines she's taking.  The only one she was able to confidently report to me was eliquis.  Will need skilled med management  DVT prophylaxis: eliquis Code Status: full Family Communication: d/w Denise Kelly--undertsands patient is stale for d/c.  TOC aware and working on placement Disposition:   Status is: Inpatient  Subjective:  "I want to leave" She is less confuse today Somewhat HOH No cp fever  Objective: Vitals:   09/18/22 0515 09/18/22 2139 09/19/22 0551 09/19/22 1216  BP: 119/70 117/63 121/82 136/77  Pulse: 90 (!) 105 92 91  Resp: 18 18 18 16   Temp: 97.8 F (36.6 C) 97.7 F (36.5 C) 97.8 F (36.6 C) 98 F (36.7 C)  TempSrc: Oral Oral Oral Oral  SpO2: 96% 92% 96% 96%  Weight:      Height:        Filed Weights   09/11/22 1944  Weight: 90.7 kg    Examination:  Awake coherent in nad Abd soft nt nd no rebound Cta b S1 s2 no m/r/g  Data Reviewed: I have personally reviewed following labs and imaging studies  CBC: Recent Labs  Lab 09/14/22 0456 09/15/22 0353 09/17/22 0427  WBC 5.9 5.9 5.8  NEUTROABS 2.9 2.6 2.6  HGB 13.2 14.0 14.2  HCT 39.3 42.1 43.1  MCV 89.3 89.6 90.2  PLT 150 166 172  Basic Metabolic Panel: Recent Labs  Lab 09/13/22 0409 09/14/22 0854 09/15/22 0353 09/16/22 0429 09/17/22 0427  NA 139 138 138 138 139  K 4.1 4.1 4.6 4.3 4.7  CL 102 104 105 105 104  CO2 31 28 27 26 28   GLUCOSE 137* 110* 107* 109* 112*  BUN 5* 6* 10 13 14   CREATININE 0.65 0.64 0.78 0.90 0.63  CALCIUM 8.4* 8.5* 8.6* 8.6* 8.6*  MG  --  2.0 1.8 2.1 2.0  PHOS  --  3.6 4.1  --  4.4    Scheduled Meds:  acetaminophen  650 mg  Oral BID   apixaban  5 mg Oral BID   cyanocobalamin  1,000 mcg Oral Daily   gabapentin  100 mg Oral TID   Continuous Infusions:   LOS: 8 days   Time spent: 15 min  Rhetta Mura, MD [facility] 09/19/2022, 5:35 PM

## 2022-09-19 NOTE — TOC Progression Note (Signed)
Transition of Care [facility]) - Progression Note    Patient Details  Name: Denise Kelly MRN: [id number] Date of Birth: 1940/05/18  Transition of Care [facility]) CM/SW Contact  Adrian Prows, RN Phone Number: 09/19/2022, 2:13 PM  Clinical Narrative:    LVM for pt's legal guardian, [name] [phone]) to obtain bed choice; awaiting return call.   Expected Discharge Plan: Skilled Nursing Facility Barriers to Discharge: Continued Medical Work up  Expected Discharge Plan and Services In-house Referral: Clinical Social Work     Living arrangements for the past 2 months: Assisted Living Facility                                       Social Determinants of Health (SDOH) Interventions SDOH Screenings   Food Insecurity: No Food Insecurity (09/12/2022)  Housing: Low Risk  (09/12/2022)  Transportation Needs: No Transportation Needs (09/12/2022)  Utilities: Not At Risk (09/12/2022)  Depression (PHQ2-9): Low Risk  (01/09/2019)  Tobacco Use: Medium Risk (09/11/2022)    Readmission Risk Interventions    05/02/2022   11:19 AM  Readmission Risk Prevention Plan  Transportation Screening Complete  Medication Review (RN Care Manager) Complete  PCP or Specialist appointment within 3-5 days of discharge Complete  HRI or Home Care Consult Complete  SW Recovery Care/Counseling Consult Complete  Palliative Care Screening Not Applicable  Skilled Nursing Facility Complete

## 2022-09-20 DIAGNOSIS — E876 Hypokalemia: Secondary | ICD-10-CM | POA: Diagnosis not present

## 2022-09-20 MED ORDER — OLANZAPINE 5 MG PO TBDP
5.0000 mg | ORAL_TABLET | Freq: Every day | ORAL | Status: DC
  Filled 2022-09-20: qty 1

## 2022-09-20 MED ORDER — OLANZAPINE 5 MG PO TBDP: 5.0000 mg | ORAL_TABLET | Freq: Every day | ORAL | Status: DC

## 2022-09-20 NOTE — Progress Notes (Signed)
Pt has been refusing assessment by this nurse, but still took medications. Notified charge nurse.

## 2022-09-20 NOTE — Progress Notes (Signed)
Occupational Therapy Treatment Patient Details Name: Denise Kelly MRN: [id number] DOB: 09/18/1940 Today's Date: 09/20/2022   History of present illness 82 y.o. female with medical history significant for history of PE on Eliquis, lymphedema, cellulitis, chronic pain syndrome, medical noncompliance, L and R TKAs, lumbar fusion  who was found in her room covered in diarrhea.  The patient lives in an independent living facility facility and staff went to check on her because she had not been seen in a couple of days.  Pt admitted 09/11/22 for severe hypokalemia   OT comments  Patient with maximal encouragement was able to engage in time out of bed with min guard to complete supine to sit with increased time and use of bed rails, and transfer to recliner on opposite side of room. Patient was praised for participation and educated on how performance like this would be best to prove she is ready to transition back to ALF. Patient verbalized understanding. Patient declined to participate in more functional mobility on this date. Patient's discharge plan remains appropriate at this time. OT will continue to follow acutely.     Recommendations for follow up therapy are one component of a multi-disciplinary discharge planning process, led by the attending physician.  Recommendations may be updated based on patient status, additional functional criteria and insurance authorization.    Assistance Recommended at Discharge Frequent or constant Supervision/Assistance  Patient can return home with the following  A lot of help with bathing/dressing/bathroom;Assistance with cooking/housework;Direct supervision/assist for medications management;Direct supervision/assist for financial management;Assist for transportation;Help with stairs or ramp for entrance;A little help with walking and/or transfers         Precautions / Restrictions Precautions Precautions: Fall Restrictions Weight Bearing Restrictions: No        Mobility Bed Mobility Overal bed mobility: Needs Assistance Bed Mobility: Supine to Sit     Supine to sit: Min guard     General bed mobility comments: with use of bed rails and HOB raised          Balance Overall balance assessment: Mild deficits observed, not formally tested         ADL either performed or assessed with clinical judgement   ADL       Grooming: Sitting;Brushing hair;Modified independent Grooming Details (indicate cue type and reason): in recliner               Lower Body Dressing Details (indicate cue type and reason): TD to don gripper socks with pain in BLE/heels with application. heel protection pads in place Toilet Transfer: Min guard;Ambulation;Rolling walker (2 wheels) Toilet Transfer Details (indicate cue type and reason): to transfer from edge of bed to recliner on opposite side of room. patient declining to walk in the hallway at this time.           General ADL Comments: needed increased ecnouragemnet to participate in time out of bed. patient worried about chronic urinary incontinence with adult absorbent undergarment left in room for patient to use when transitioning into hallway. patient verbalized understanding.      Cognition Arousal/Alertness: Awake/alert Behavior During Therapy: Flat affect Overall Cognitive Status: Impaired/Different from baseline       General Comments: patient was easily irritated during session with patient not cooperating at start with patient cotinuously confused with reports that she wants to transition home.agreeable to get out of bed with increased encouragement                   Pertinent  Vitals/ Pain       Pain Assessment Pain Assessment: Faces Faces Pain Scale: Hurts little more Pain Location: BLE and lower back Pain Descriptors / Indicators: Guarding, Constant Pain Intervention(s): Limited activity within patient's tolerance, Repositioned, Monitored during session          Frequency  Min 2X/week        Progress Toward Goals  OT Goals(current goals can now be found in the care plan section)  Progress towards OT goals: Progressing toward goals     Plan Discharge plan remains appropriate       AM-PAC OT "6 Clicks" Daily Activity     Outcome Measure   Help from another person eating meals?: None Help from another person taking care of personal grooming?: None Help from another person toileting, which includes using toliet, bedpan, or urinal?: A Lot Help from another person bathing (including washing, rinsing, drying)?: A Lot Help from another person to put on and taking off regular upper body clothing?: A Little Help from another person to put on and taking off regular lower body clothing?: A Lot 6 Click Score: 17    End of Session Equipment Utilized During Treatment: Rolling walker (2 wheels);Gait belt  OT Visit Diagnosis: Muscle weakness (generalized) (M62.81);History of falling (Z91.81);Other symptoms and signs involving cognitive function;Adult, failure to thrive (R62.7);Pain   Activity Tolerance Patient tolerated treatment well   Patient Left in chair;with call bell/phone within reach   Nurse Communication Mobility status        Time: 6045-4098 OT Time Calculation (min): 36 min  Charges: OT General Charges $OT Visit: 1 Visit OT Treatments $Self Care/Home Management : 23-37 mins  Rosalio Loud, MS Acute Rehabilitation Department Office# [phone]   Selinda Flavin 09/20/2022, 12:14 PM

## 2022-09-20 NOTE — Progress Notes (Signed)
PROGRESS NOTE Denise Kelly  ZOX:[id number] DOB: 1940-05-22 DOA: 09/11/2022 PCP: Pcp, No  Brief Narrative:   82 year old white female ILF dwelling  HF PEF 60-65% Prior pulmonary embolism and DVT on Eliquis-submassive PE 12/2017 Hypodensity 1.6 cm right thyroid lobe Lymphedema--- chronic bilateral lower extremity pain followed by Dr. Pascal Lux (ABI and other workup -8/17) Prior L TKR, L THR previously on suppressive therapy on Keflex followed by infectious disease L4-L5 decompressive laminectomy 07/2011 Dr. Wynetta Emery note Bipolar HTN Restless leg syndrome UGIB while on Eliquis 2019--endoscopy 2019 diminutive MW tear with gastric ulcer that was clipped and placed on Protonix  Staff went to check on her at her ILF as she had not been seen for couple of days she had terrible diarrhea appetite is at baseline workup was revealing for severe hypokalemia with potassium less than 2  Discharge pending placement.   Assessment & Plan:   Principal Problem:   Hypokalemia Active Problems:   LAFB (left anterior fascicular block)   Alcohol dependence (HCC)   RLS (restless legs syndrome)   Chronic pain syndrome   Lymphedema   History of pulmonary embolus (PE)  Cognitive Impairment , possibly 2/2 Polypharmacy Mild memory issues.  Therapy recommends skilled placement --also has polypharmacy that requires skilled attention to med management  Severe Hypokalemia 2/2 acute diarrheal illness,? chronic diuretic therapy with lasix (increased to BID lasix in March give her LE edema, though she hasn't taken this in 10 days) Resolved--Goal mag >2 --Periodic labs  Klebsiella Pneumoniae UTI Rx prior with duricef She's having dysuria which has since resolved  Diarrhea Seems resolved  Lower Extremity Cellulitis Likely has venous stasis changes? represent cellulitis---only scabbing to LE's now Received a total of 6 days abx---improved  Lymphedema  Chronic Lower Extremity Pain gabapentin, tylenol from prior  to admission continued (not taking cymbalta or tramadol) Needs outpatient lymphedema care  Hx Etoh Abuse Ciwa has been negative , d'c protocol 7/3  Hypertension Not on any meds for this--minimize meds and consider clonidine as a prn in the OP setting  Hx PE and DVT Eliquis 5 twice daily will need to be on this lifelong  High risk polypharmacy ?which medicines she's taking.  The only one she was able to confidently report to me was eliquis.  Will need skilled med management  DVT prophylaxis: eliquis Code Status: full Family Communication: d/w Denise Kelly--undertsands patient is stale for d/c.  TOC aware and working on placement Disposition:   Status is: Inpatient  Subjective:  Calmer more collected today-definitely quite hard of hearing-seems to comprehend where she is time place person Really wants to go back to her ILF-I have told her that this is probably not safe for her-see separate charting regarding discussion with patient's legal guardian Denise. Kelly--- it appears she has chosen [facility] for placement when available per TOC  Objective: Vitals:   09/19/22 1216 09/19/22 1948 09/20/22 0455 09/20/22 1232  BP: 136/77 121/69 134/78 137/81  Pulse: 91 98 84 100  Resp: 16 20 20 20   Temp: 98 F (36.7 C) 97.8 F (36.6 C) 97.6 F (36.4 C) 98 F (36.7 C)  TempSrc: Oral Oral Oral   SpO2: 96% 93% 97% 98%  Weight:      Height:        Filed Weights   09/11/22 1944  Weight: 90.7 kg    Examination:  Coherent no distress quite hard of hearing Looks about stated age Chest is clear no wheeze Abdomen is soft no rebound no guarding No  lower extremity edema  Data Reviewed: I have personally reviewed following labs and imaging studies  CBC: Recent Labs  Lab 09/14/22 0456 09/15/22 0353 09/17/22 0427  WBC 5.9 5.9 5.8  NEUTROABS 2.9 2.6 2.6  HGB 13.2 14.0 14.2  HCT 39.3 42.1 43.1  MCV 89.3 89.6 90.2  PLT 150 166 172     Basic Metabolic Panel: Recent Labs  Lab  09/14/22 0854 09/15/22 0353 09/16/22 0429 09/17/22 0427  NA 138 138 138 139  K 4.1 4.6 4.3 4.7  CL 104 105 105 104  CO2 28 27 26 28   GLUCOSE 110* 107* 109* 112*  BUN 6* 10 13 14   CREATININE 0.64 0.78 0.90 0.63  CALCIUM 8.5* 8.6* 8.6* 8.6*  MG 2.0 1.8 2.1 2.0  PHOS 3.6 4.1  --  4.4    Scheduled Meds:  acetaminophen  650 mg Oral BID   apixaban  5 mg Oral BID   cyanocobalamin  1,000 mcg Oral Daily   gabapentin  100 mg Oral TID   Continuous Infusions:   LOS: 9 days   Time spent: 15 min  Rhetta Mura, MD [facility] 09/20/2022, 5:02 PM

## 2022-09-21 DIAGNOSIS — E876 Hypokalemia: Secondary | ICD-10-CM | POA: Diagnosis not present

## 2022-09-21 LAB — BASIC METABOLIC PANEL
Anion gap: 6 (ref 5–15)
BUN: 17 mg/dL (ref 8–23)
CO2: 23 mmol/L (ref 22–32)
Calcium: 8.3 mg/dL — ABNORMAL LOW (ref 8.9–10.3)
Chloride: 107 mmol/L (ref 98–111)
Creatinine, Ser: 0.67 mg/dL (ref 0.44–1.00)
GFR, Estimated: >60 mL/min (ref >60)
Glucose, Bld: 121 mg/dL — ABNORMAL HIGH (ref 70–99)
Potassium: 3.1 mmol/L — ABNORMAL LOW (ref 3.5–5.1)
Sodium: 136 mmol/L (ref 135–145)

## 2022-09-21 MED ORDER — POTASSIUM CHLORIDE CRYS ER 20 MEQ PO TBCR
40.0000 meq | EXTENDED_RELEASE_TABLET | Freq: Every day | ORAL | Status: DC
  Administered 2022-09-21 – 2022-09-22 (×2): 40 meq via ORAL
  Filled 2022-09-21 (×2): qty 2

## 2022-09-21 MED ORDER — OLANZAPINE 5 MG PO TBDP: 5.0000 mg | ORAL_TABLET | Freq: Every day | ORAL | Status: DC | PRN

## 2022-09-21 NOTE — TOC Progression Note (Addendum)
Transition of Care [facility]) - Progression Note    Patient Details  Name: Denise Kelly MRN: [id number] Date of Birth: [birth year]  [facility]) CM/SW Contact  Carmina Miller, [place] Phone Number: 09/21/2022, 11:41 AM  Clinical Narrative:    Update-CSW received call back from [facility], she chooses [facility] for STR. CSW explained insurance auth process, all questions answered.   CSW attempted to contact [facility] in reference to bed offer selection, no answer, vm left.   Expected Discharge Plan: Skilled Nursing Facility Barriers to Discharge: Continued Medical Work up  Expected Discharge Plan and Services In-house Referral: Clinical Social Work     Living arrangements for the past 2 months: Assisted Living Facility                                       Social Determinants of Health (SDOH) Interventions SDOH Screenings   Food Insecurity: No Food Insecurity (09/12/2022)  Housing: Low Risk  (09/12/2022)  Transportation Needs: No Transportation Needs (09/12/2022)  Utilities: Not At Risk (09/12/2022)  Depression (PHQ2-9): Low Risk  (01/09/2019)  Tobacco Use: Medium Risk (09/11/2022)    Readmission Risk Interventions    05/02/2022   11:19 AM  Readmission Risk Prevention Plan  Transportation Screening Complete  Medication Review (RN Care Manager) Complete  PCP or Specialist appointment within 3-5 days of discharge Complete  HRI or Home Care Consult Complete  SW Recovery Care/Counseling Consult Complete  Palliative Care Screening Not Applicable  Skilled Nursing Facility Complete

## 2022-09-21 NOTE — Progress Notes (Signed)
Physical Therapy Treatment Patient Details Name: Denise Kelly MRN: [id number] DOB: [birth year] Today's Date: 09/21/2022   History of Present Illness 82 y.o. female with medical history significant for history of PE on Eliquis, lymphedema, cellulitis, chronic pain syndrome, medical noncompliance, L and R TKAs, lumbar fusion  who was found in her room covered in diarrhea.  The patient lives in an independent living facility facility and staff went to check on her because she had not been seen in a couple of days.  Pt admitted 09/11/22 for severe hypokalemia         PT Comments   Pt with improved mentation this session. Pt is motivated to return to ILF setting, does not want to go to SNF. Pt mobility has improved-appears to be returning to her baseline, is able to amb ~ 36' with RW and min/guard today.  Pt likely needs to ultimately  transition to ALF setting however may not be agreeable to this.     Assistance Recommended at Discharge Frequent or constant Supervision/Assistance  If plan is discharge home, recommend the following:  Can travel by private vehicle    A lot of help with walking and/or transfers;A lot of help with bathing/dressing/bathroom;Assistance with cooking/housework;Assist for transportation;Direct supervision/assist for medications management;Help with stairs or ramp for entrance;Direct supervision/assist for financial management   No  Equipment Recommendations  None recommended by PT    Recommendations for Other Services       Precautions / Restrictions Precautions Precautions: Fall Restrictions Weight Bearing Restrictions: No     Mobility  Bed Mobility               General bed mobility comments: in recliner on arrival and departure    Transfers Overall transfer level: Needs assistance Equipment used: Rolling walker (2 wheels) Transfers: Sit to/from Stand Sit to Stand: Min guard           General transfer comment: cues for correct hand  placement and to power up with LEs    Ambulation/Gait Ambulation/Gait assistance: Min guard, Supervision Gait Distance (Feet): 80 Feet Assistive device: Rolling walker (2 wheels) Gait Pattern/deviations: Step-through pattern, Decreased stride length, Wide base of support       General Gait Details: verbal cues for posture and proximity to [employer]             Wheelchair Mobility     Tilt Bed    Modified Rankin (Stroke Patients Only)       Balance Overall balance assessment: Mild deficits observed, not formally tested, Needs assistance         Standing balance support: During functional activity, Reliant on assistive device for balance Standing balance-Leahy Scale: Fair Standing balance comment: reliant on device for gait                            Cognition Arousal/Alertness: Awake/alert Behavior During Therapy: Flat affect Overall Cognitive Status: History of cognitive impairments - at baseline                                 General Comments: patient is at her baseline. has h/o confusion. [facility]        Exercises      General Comments        Pertinent Vitals/Pain Pain Assessment Pain Assessment: Faces Faces Pain Scale: Hurts a little bit Pain Location: BLE and lower  back Pain Descriptors / Indicators: Guarding, Constant Pain Intervention(s): Limited activity within patient's tolerance, Monitored during session    Home Living                          Prior Function            PT Goals (current goals can now be found in the care plan section) Acute Rehab PT Goals PT Goal Formulation: With patient Time For Goal Achievement: 09/27/22 Potential to Achieve Goals: Fair Progress towards PT goals: Progressing toward goals    Frequency    Min 1X/week      PT Plan Current plan remains appropriate    Co-evaluation              AM-PAC PT "6 Clicks" Mobility   Outcome Measure  Help needed  turning from your back to your side while in a flat bed without using bedrails?: A Little Help needed moving from lying on your back to sitting on the side of a flat bed without using bedrails?: A Little Help needed moving to and from a bed to a chair (including a wheelchair)?: A Little Help needed standing up from a chair using your arms (e.g., wheelchair or bedside chair)?: A Little Help needed to walk in hospital room?: A Little Help needed climbing 3-5 steps with a railing? : A Little 6 Click Score: 18    End of Session Equipment Utilized During Treatment: Gait belt Activity Tolerance: Patient tolerated treatment well Patient left: with call bell/phone within reach;with nursing/sitter in room (pt standing in room with RN for peri-care d/t incontinent of urine) Nurse Communication: Mobility status PT Visit Diagnosis: Muscle weakness (generalized) (M62.81)     Time: 1350-1410 PT Time Calculation (min) (ACUTE ONLY): 20 min  Charges:    $Gait Training: 8-22 mins PT General Charges $$ ACUTE PT VISIT: 1 Visit                     Arriah Wadle, PT  Acute Rehab Dept [facility]) [phone]  09/21/2022    [facility] 09/21/2022, 3:11 PM

## 2022-09-21 NOTE — Progress Notes (Signed)
PROGRESS NOTE Denise Kelly  ZOX:[id number] DOB: [birth year] DOA: 09/11/2022 PCP: Pcp, No  Brief Narrative:   82 year old white female ILF dwelling  HF PEF 60-65% Prior pulmonary embolism and DVT on Eliquis-submassive PE 12/2017 Hypodensity 1.6 cm right thyroid lobe Lymphedema--- chronic bilateral lower extremity pain followed by Dr. Pascal Lux (ABI and other workup -8/17) Prior L TKR, L THR previously on suppressive therapy on Keflex followed by infectious disease L4-L5 decompressive laminectomy 07/2011 Dr. Wynetta Emery note Bipolar HTN Restless leg syndrome UGIB while on Eliquis 2019--endoscopy 2019 diminutive MW tear with gastric ulcer that was clipped and placed on Protonix  Staff went to check on her at her ILF as she had not been seen for couple of days she had terrible diarrhea appetite is at baseline workup was revealing for severe hypokalemia with potassium less than 2  Discharge pending placement.   Assessment & Plan:   Principal Problem:   Hypokalemia Active Problems:   LAFB (left anterior fascicular block)   Alcohol dependence (HCC)   RLS (restless legs syndrome)   Chronic pain syndrome   Lymphedema   History of pulmonary embolus (PE)  Cognitive Impairment , possibly 2/2 Polypharmacy---Confounded by extreme hard of hearing Mild memory issues--much more agreeable overall  therapy has upgraded their recommendations to-skilled still but if cannot obtain therapy at home may be able to return to her ILF? -also has polypharmacy that requires skilled attention to med management -Patient tells me that her legal guardian has been "mean to her" and that we need to listen to her the patient She is not completely oriented however--she required Zydus 5 every afternoon on 7/6 for some agitation and we will reassess her tomorrow and see if this is a pattern of sundowning  Severe Hypokalemia 2/2 acute diarrheal illness,? chronic diuretic therapy with lasix (increased to BID lasix in March give  her LE edema, though she hasn't taken this in 10 days) Potassium slightly low again today-replace with K-Lor 40 -Goal mag >2 --Periodic labs  Klebsiella Pneumoniae UTI Rx prior with duricef She's having dysuria which has since resolved  Diarrhea Seems resolved  Lower Extremity Cellulitis Likely has venous stasis changes? represent cellulitis---only scabbing to LE's now Received a total of 6 days abx---improved  Lymphedema  Chronic Lower Extremity Pain gabapentin, tylenol from prior to admission continued (not taking cymbalta or tramadol) Needs outpatient lymphedema care  Hx Etoh Abuse Ciwa has been negative , d'c protocol 7/3  Hypertension Not on any meds for this--minimize meds and consider clonidine as a prn in the OP setting  Hx PE and DVT Eliquis 5 twice daily will need to be on this lifelong  High risk polypharmacy ?which medicines she's taking.  The only one she was able to confidently report to me was eliquis.  Will need skilled med management  DVT prophylaxis: eliquis Code Status: full Family Communication: d/w [name]-previously-undertsands patient is stable Patient may refuse to go to skilled-PT/TOC to reach out at my request to legal guardian and discuss options for skilled care at [facility] be a possibility?-Appreciated  Disposition:   Status is: Inpatient  Subjective:  Calmer more collected today-definitely quite hard of hearing-seems to comprehend where she is time place person Really wants to go back to her ILF still and seems to think that her legal guardian is "out to get her" Asked me to "help her get back home" Therapy worked with her as per their note patient walked 80 feet although required contact-guard  Objective: Vitals:   09/20/22  1232 09/20/22 2123 09/21/22 0542 09/21/22 1250  BP: 137/81 130/65 124/71 123/69  Pulse: 100 (!) 110 88 92  Resp: 20 18 17 16   Temp: 98 F (36.7 C) 97.7 F (36.5 C) 97.7 F (36.5 C) (!) 97.5 F (36.4 C)   TempSrc:  Oral Oral Oral  SpO2: 98% 92% 95% 97%  Weight:      Height:        Filed Weights   09/11/22 1944  Weight: 90.7 kg    Examination:  Coherent no distress quite hard of hearing Looks about stated age Chest is clear no wheeze Abdomen is soft no rebound no guarding Gait was slightly antalgic she used a walker however with minimal assistance as per PTs note  Data Reviewed: I have personally reviewed following labs and imaging studies  CBC: Recent Labs  Lab 09/15/22 0353 09/17/22 0427  WBC 5.9 5.8  NEUTROABS 2.6 2.6  HGB 14.0 14.2  HCT 42.1 43.1  MCV 89.6 90.2  PLT 166 172     Basic Metabolic Panel: Recent Labs  Lab 09/15/22 0353 09/16/22 0429 09/17/22 0427 09/21/22 0404  NA 138 138 139 136  K 4.6 4.3 4.7 3.1*  CL 105 105 104 107  CO2 27 26 28 23   GLUCOSE 107* 109* 112* 121*  BUN 10 13 14 17   CREATININE 0.78 0.90 0.63 0.67  CALCIUM 8.6* 8.6* 8.6* 8.3*  MG 1.8 2.1 2.0  --   PHOS 4.1  --  4.4  --     Scheduled Meds:  acetaminophen  650 mg Oral BID   apixaban  5 mg Oral BID   cyanocobalamin  1,000 mcg Oral Daily   gabapentin  100 mg Oral TID   OLANZapine zydis  5 mg Oral q1800   potassium chloride  40 mEq Oral Daily   Continuous Infusions:   LOS: 10 days   Time spent: 15 min  Rhetta Mura, MD [facility] 09/21/2022, 3:42 PM

## 2022-09-21 NOTE — Progress Notes (Signed)
Occupational Therapy Treatment Patient Details Name: LAKESHA AMAKER MRN: [id number] DOB: Jul 05, 1940 Today's Date: 09/21/2022   History of present illness 82 y.o. female with medical history significant for history of PE on Eliquis, lymphedema, cellulitis, chronic pain syndrome, medical noncompliance, L and R TKAs, lumbar fusion  who was found in her room covered in diarrhea.  The patient lives in an independent living facility facility and staff went to check on her because she had not been seen in a couple of days.  Pt admitted 09/11/22 for severe hypokalemia   OT comments  Patient continues to have improved mentation from previous sessions. Patient was cooperative and agreeable to therapy. Patient was able to engage in toileting in bathroom with reduced assistance. Patient is motivated to improve to transition back to ILF. Patient's discharge plan remains appropriate at this time. OT will continue to follow acutely.     Recommendations for follow up therapy are one component of a multi-disciplinary discharge planning process, led by the attending physician.  Recommendations may be updated based on patient status, additional functional criteria and insurance authorization.    Assistance Recommended at Discharge Frequent or constant Supervision/Assistance  Patient can return home with the following  A lot of help with bathing/dressing/bathroom;Assistance with cooking/housework;Direct supervision/assist for medications management;Direct supervision/assist for financial management;Assist for transportation;Help with stairs or ramp for entrance;A little help with walking and/or transfers         Precautions / Restrictions Precautions Precautions: Fall Restrictions Weight Bearing Restrictions: No       Mobility Bed Mobility Overal bed mobility: Needs Assistance Bed Mobility: Supine to Sit     Supine to sit: Supervision     General bed mobility comments: with use of bed rails and HOB  raised            ADL either performed or assessed with clinical judgement   ADL Overall ADL's : Needs assistance/impaired     Grooming: Sitting;Brushing hair;Modified independent Grooming Details (indicate cue type and reason): in recliner                 Toilet Transfer: Ambulation;Rolling walker (2 wheels);Minimal assistance Toilet Transfer Details (indicate cue type and reason): to bathroom and then to recliner in room. no BSC over toilet Toileting- Clothing Manipulation and Hygiene: Min guard;Sitting/lateral lean                Cognition Arousal/Alertness: Awake/alert Behavior During Therapy: Flat affect Overall Cognitive Status: History of cognitive impairments - at baseline           General Comments: patient is at her baseline. has h/o confusion. HOH                   Pertinent Vitals/ Pain       Pain Assessment Pain Assessment: Faces Faces Pain Scale: Hurts a little bit Pain Location: BLE and lower back Pain Descriptors / Indicators: Guarding, Constant Pain Intervention(s): Limited activity within patient's tolerance, Repositioned         Frequency  Min 2X/week        Progress Toward Goals  OT Goals(current goals can now be found in the care plan section)  Progress towards OT goals: Progressing toward goals     Plan Discharge plan remains appropriate       AM-PAC OT "6 Clicks" Daily Activity     Outcome Measure   Help from another person eating meals?: None Help from another person taking care of personal grooming?: None Help from  another person toileting, which includes using toliet, bedpan, or urinal?: A Little Help from another person bathing (including washing, rinsing, drying)?: A Lot Help from another person to put on and taking off regular upper body clothing?: A Little Help from another person to put on and taking off regular lower body clothing?: A Lot 6 Click Score: 18    End of Session Equipment Utilized  During Treatment: Rolling walker (2 wheels);Gait belt  OT Visit Diagnosis: Muscle weakness (generalized) (M62.81);History of falling (Z91.81);Other symptoms and signs involving cognitive function;Adult, failure to thrive (R62.7);Pain   Activity Tolerance Patient tolerated treatment well   Patient Left in chair;with call bell/phone within reach;with chair alarm set   Nurse Communication Mobility status        Time: 8295-6213 OT Time Calculation (min): 34 min  Charges: OT General Charges $OT Visit: 1 Visit OT Treatments $Self Care/Home Management : 23-37 mins  Rosalio Loud, MS Acute Rehabilitation Department Office# [phone]   Selinda Flavin 09/21/2022, 12:26 PM

## 2022-09-22 DIAGNOSIS — E876 Hypokalemia: Secondary | ICD-10-CM | POA: Diagnosis not present

## 2022-09-22 LAB — BASIC METABOLIC PANEL
Anion gap: 5 (ref 5–15)
BUN: 17 mg/dL (ref 8–23)
CO2: 29 mmol/L (ref 22–32)
Calcium: 8.7 mg/dL — ABNORMAL LOW (ref 8.9–10.3)
Chloride: 108 mmol/L (ref 98–111)
Creatinine, Ser: 0.87 mg/dL (ref 0.44–1.00)
GFR, Estimated: >60 mL/min (ref >60)
Glucose, Bld: 113 mg/dL — ABNORMAL HIGH (ref 70–99)
Potassium: 4.7 mmol/L (ref 3.5–5.1)
Sodium: 142 mmol/L (ref 135–145)

## 2022-09-22 LAB — MAGNESIUM: Magnesium: 2.1 mg/dL (ref 1.7–2.4)

## 2022-09-22 NOTE — Care Management Important Message (Signed)
Important Message  Patient Details IM Letter given. Name: AILANIE HOWMAN MRN: [id number] Date of Birth: [birth year]   Medicare Important Message Given:  Yes     Caren Macadam 09/22/2022, 11:48 AM

## 2022-09-22 NOTE — Progress Notes (Signed)
OT Cancellation Note  Patient Details Name: Denise Kelly MRN: [id number] DOB: Apr 09, 1940   Cancelled Treatment:    Reason Eval/Treat Not Completed: Patient declined, no reason specified Patient refused reporting she wanted to be seen in the afternoon. Patient was educated that this was the afternoon. Patient said let me clarify " I am not doing it now". OT to continue to follow. Rosalio Loud, MS Acute Rehabilitation Department Office# [phone]  09/22/2022, 12:53 PM

## 2022-09-22 NOTE — TOC Progression Note (Signed)
Transition of Care [facility]) - Progression Note    Patient Details  Name: Denise Kelly MRN: [id number] Date of Birth: 05-Sep-1940  Transition of [facility]) CM/SW Contact  Amada [place], [place] Phone Number: 09/22/2022, 1:30 PM  Clinical Narrative:     Met with pt today to review noted SNF bed acceptance by her contact person, [facility], for [employer].  Pt immediately refuses this plan and states, "I'm going home" (returning to her IL apt at [facility]).  Attempted to explain to pt that SNF has been recommended and that Ms. Brame has agreed with plan.  Pt angry with this and insists Ms. Brame has no rights to make this choice.  Per chart, it was noted that Ms. Brame is pt's "legal guardian", however, Ms. Phillips Climes clarifies that she is NOT pt's guardian but is her HCPOA.   Discussed with Ms. Phillips Climes that pt must agree to this plan for SNF and she plans to follow up with pt to discuss further.   Have alerted all that insurance Berkley Harvey has been started by [place] and, when we have auth, pt must be agreeable to this plan.  Have left VM for IL coordinator at [facility] to please return call to discuss option of pt's return to IL if she continues to refuse SNF plan.  Expected Discharge Plan: Skilled Nursing Facility Barriers to Discharge: Continued Medical Work up  Expected Discharge Plan and Services In-house Referral: Clinical Social Work     Living arrangements for the past 2 months: Assisted Living Facility                                       Social Determinants of Health (SDOH) Interventions SDOH Screenings   Food Insecurity: No Food Insecurity (09/12/2022)  Housing: Low Risk  (09/12/2022)  Transportation Needs: No Transportation Needs (09/12/2022)  Utilities: Not At Risk (09/12/2022)  Depression (PHQ2-9): Low Risk  (01/09/2019)  Tobacco Use: Medium Risk (09/11/2022)    Readmission Risk Interventions    05/02/2022   11:19 AM  Readmission Risk Prevention Plan   Transportation Screening Complete  Medication Review (RN Care Manager) Complete  PCP or Specialist appointment within 3-5 days of discharge Complete  HRI or Home Care Consult Complete  SW Recovery Care/Counseling Consult Complete  Palliative Care Screening Not Applicable  Skilled Nursing Facility Complete

## 2022-09-22 NOTE — Progress Notes (Signed)
Patient refused am blood draw for labs.

## 2022-09-22 NOTE — Progress Notes (Signed)
OT Cancellation Note  Patient Details Name: Denise Kelly MRN: [id number] DOB: 03/31/1940   Cancelled Treatment:    Reason Eval/Treat Not Completed: Patient declined, no reason specified Patient declined reporting having just gotten up into chair with nursing and still having "damp skin" from bath.  Rosalio Loud, MS Acute Rehabilitation Department Office# [phone]  09/22/2022, 12:52 PM

## 2022-09-22 NOTE — Progress Notes (Signed)
PROGRESS NOTE AMABELLE WARDLE  ZOX:[id number] DOB: [birth year] DOA: 09/11/2022 PCP: Pcp, No  Brief Narrative:   82 year old white female ILF dwelling  HF PEF 60-65% Prior pulmonary embolism and DVT on Eliquis-submassive PE 12/2017 Hypodensity 1.6 cm right thyroid lobe Lymphedema--- chronic bilateral lower extremity pain followed by Dr. Pascal Lux (ABI and other workup -8/17) Prior L TKR, L THR previously on suppressive therapy on Keflex followed by infectious disease L4-L5 decompressive laminectomy 07/2011 Dr. Wynetta Emery note Bipolar HTN Restless leg syndrome UGIB while on Eliquis 2019--endoscopy 2019 diminutive MW tear with gastric ulcer that was clipped and placed on Protonix  Staff went to check on her at her ILF as she had not been seen for couple of days she had terrible diarrhea appetite is at baseline workup was revealing for severe hypokalemia with potassium less than 2  Discharge pending placement.   Assessment & Plan:   Principal Problem:   Hypokalemia Active Problems:   LAFB (left anterior fascicular block)   Alcohol dependence (HCC)   RLS (restless legs syndrome)   Chronic pain syndrome   Lymphedema   History of pulmonary embolus (PE)  Cognitive Impairment , possibly 2/2 Polypharmacy---Confounded by extreme hard of hearing Mild memory issues--much more agreeable overall and seems more coherent than since admitted  therapy has upgraded their recommendations to-skilled still but if can obtain therapy at home may be able to return to her ILF? -also has polypharmacy that requires skilled attention to med management -She is no longer really disoriented-she periodically receives Zydis 5 mg every afternoon but has been more agreeable and less agitated  Severe Hypokalemia 2/2 acute diarrheal illness,? chronic diuretic therapy with lasix (increased to BID lasix in March give her LE edema, though she hasn't taken this in 10 days) Potassium improved with replacement-stop  replacement -Goal mag >2 --Periodic labs as outpatient  Klebsiella Pneumoniae UTI Rx prior with duricef She's having dysuria which has since resolved  Diarrhea Seems resolved  Lower Extremity Cellulitis Likely has venous stasis changes? represent cellulitis---only scabbing to LE's now Received a total of 6 days abx---improved  Lymphedema  Chronic Lower Extremity Pain gabapentin, tylenol from prior to admission continued (not taking cymbalta or tramadol) Needs outpatient lymphedema care  Hx Etoh Abuse Ciwa has been negative , d'c protocol 7/3  Hypertension Not on any meds for this--minimize meds and consider clonidine as a prn in the OP setting  Hx PE and DVT Eliquis 5 twice daily will need to be on this lifelong  High risk polypharmacy ?which medicines she's taking.  The only one she was able to confidently report to me was eliquis.  Will need skilled med management  DVT prophylaxis: eliquis Code Status: full Family Communication: Social work has been in touch with legal guardian-patient really wishes to go home and we are working on returning her to her facility versus rehab if she is agreeable  Disposition:   Status is: Inpatient  Subjective:  Quite hard of hearing no distress Looks fair Asking to go home again  Objective: Vitals:   09/21/22 1635 09/21/22 2109 09/22/22 0545 09/22/22 1341  BP: (!) 145/79 (!) 149/77 134/78 115/76  Pulse: 97 93 88 88  Resp: 17 18 17 18   Temp: (!) 97.5 F (36.4 C) 97.6 F (36.4 C) 97.8 F (36.6 C) 97.6 F (36.4 C)  TempSrc: Oral  Oral   SpO2: 97% 99% 97% 97%  Weight:      Height:        [employer]  09/11/22 1944  Weight: 90.7 kg    Examination:  EOMI NCAT no focal deficit no icterus no pallor S1-S2 no murmur no rub or gallop Chest is clear no added sound ROM is intact--- moving all extremities She has a little bit of foot drop bilaterally she has mild swelling of the lower extremities with scabbing over the  anterior aspect of the Abdomen is soft slightly obese nontender no rebound She is oriented   Data Reviewed: I have personally reviewed following labs and imaging studies  CBC: Recent Labs  Lab 09/17/22 0427  WBC 5.8  NEUTROABS 2.6  HGB 14.2  HCT 43.1  MCV 90.2  PLT 172     Basic Metabolic Panel: Recent Labs  Lab 09/16/22 0429 09/17/22 0427 09/21/22 0404 09/22/22 0522  NA 138 139 136 142  K 4.3 4.7 3.1* 4.7  CL 105 104 107 108  CO2 26 28 23 29   GLUCOSE 109* 112* 121* 113*  BUN 13 14 17 17   CREATININE 0.90 0.63 0.67 0.87  CALCIUM 8.6* 8.6* 8.3* 8.7*  MG 2.1 2.0  --  2.1  PHOS  --  4.4  --   --     Scheduled Meds:  acetaminophen  650 mg Oral BID   apixaban  5 mg Oral BID   cyanocobalamin  1,000 mcg Oral Daily   gabapentin  100 mg Oral TID   Continuous Infusions:   LOS: 11 days   Time spent: 15 min  Rhetta Mura, MD [facility] 09/22/2022, 6:17 PM

## 2022-09-22 NOTE — Progress Notes (Addendum)
Physical Therapy Treatment Patient Details Name: NAZ LARROW MRN: [id number] DOB: 06/12/1940 Today's Date: 09/22/2022   History of Present Illness 82 y.o. female with medical history significant for history of PE on Eliquis, lymphedema, cellulitis, chronic pain syndrome, medical noncompliance, L and R TKAs, lumbar fusion  who was found in her room covered in diarrhea.  The patient lives in an independent living facility facility and staff went to check on her because she had not been seen in a couple of days.  Pt admitted 09/11/22 for severe hypokalemia    PT Comments   Pt admitted with above diagnosis.  Pt currently with functional limitations due to the deficits listed below (see PT Problem List). Pt returned in pm, 30 mins early with pt stating she would work at [employer]. Pt initially agreeable to therapy session and reported need to void bladder. Pt ed provided on incorporating toileting into therapy session and set up for transfer/gait tasks with RW and gait belt. Pt has purewick in place and PT offered to remove so pt can use the bathroom. Pt declined. Pt texting on phone and then stated since this PT was early she did not want to get OOB and PT needed to "circle back around to her."  PT provided ed on importance of active participation with therapy while in the hospital. Pt demonstrated verbal understanding however remained adamant that she was not going to get OOB. PT able to engage pt with LE TE in supine. Pt remained in bed and all needs in place. pt with Pt  Pt will benefit from acute skilled PT to increase their independence and safety with mobility to allow discharge.       Assistance Recommended at Discharge Frequent or constant Supervision/Assistance  If plan is discharge home, recommend the following:  Can travel by private vehicle    A lot of help with walking and/or transfers;A lot of help with bathing/dressing/bathroom;Assistance with cooking/housework;Assist for transportation;Direct  supervision/assist for medications management;Help with stairs or ramp for entrance;Direct supervision/assist for financial management   No  Equipment Recommendations  None recommended by PT    Recommendations for Other Services       Precautions / Restrictions Precautions Precautions: Fall Restrictions Weight Bearing Restrictions: No     Mobility  Bed Mobility                    Transfers                        Ambulation/Gait                   Stairs             Wheelchair Mobility     Tilt Bed    Modified Rankin (Stroke Patients Only)       Balance Overall balance assessment: Mild deficits observed, not formally tested, Needs assistance         Standing balance support: During functional activity, Reliant on assistive device for balance Standing balance-Leahy Scale: Fair Standing balance comment: reliant on device for gait                            Cognition Arousal/Alertness: Awake/alert Behavior During Therapy: Flat affect Overall Cognitive Status: History of cognitive impairments - at baseline Area of Impairment: Safety/judgement, Awareness, Problem solving  Safety/Judgement: Decreased awareness of safety, Decreased awareness of deficits (or denial) Awareness: Emergent Problem Solving: Slow processing, Decreased initiation, Requires verbal cues General Comments: patient is at her baseline. has h/o confusion. [facility]        Exercises General Exercises - Lower Extremity Ankle Circles/Pumps: AROM, Both, 20 reps    General Comments        Pertinent Vitals/Pain Pain Assessment Pain Assessment: Faces Faces Pain Scale: Hurts little more Pain Location: BLE and lower back Pain Descriptors / Indicators: Guarding, Constant Pain Intervention(s): Limited activity within patient's tolerance, Monitored during session    Home Living Family/patient expects to be discharged  to:: Unsure Living Arrangements: Alone   Type of Home: Independent living facility         Home Layout: One level Home Equipment: Agricultural consultant (2 wheels) Additional Comments: patient mentioned that she had some caregiver support for cleaning services and that she gets her meals in the cafeteria. She also loves her cat "Wally"    Prior Function            PT Goals (current goals can now be found in the care plan section) Acute Rehab PT Goals Patient Stated Goal: go home PT Goal Formulation: With patient Time For Goal Achievement: 09/27/22 Potential to Achieve Goals: Fair    Frequency    Min 1X/week      PT Plan      Co-evaluation              AM-PAC PT "6 Clicks" Mobility   Outcome Measure  Help needed turning from your back to your side while in a flat bed without using bedrails?: A Little Help needed moving from lying on your back to sitting on the side of a flat bed without using bedrails?: A Little Help needed moving to and from a bed to a chair (including a wheelchair)?: A Little Help needed standing up from a chair using your arms (e.g., wheelchair or bedside chair)?: A Little Help needed to walk in hospital room?: A Little Help needed climbing 3-5 steps with a railing? : A Little 6 Click Score: 18    End of Session Equipment Utilized During Treatment: Gait belt Activity Tolerance: Patient tolerated treatment well (limited participation due to percived behaviors) Patient left: in bed;with call bell/phone within reach;with chair alarm set (pt standing in room with RN for peri-care d/t incontinent of urine) Nurse Communication: Mobility status PT Visit Diagnosis: Muscle weakness (generalized) (M62.81)     Time: 8295-6213 PT Time Calculation (min) (ACUTE ONLY): 11 min  Charges:    $Therapeutic Activity: 8-22 mins PT General Charges $$ ACUTE PT VISIT: 1 Visit                     [name], PT Acute Rehab    [name] 09/22/2022, 3:44  PM

## 2022-09-22 NOTE — Progress Notes (Signed)
PT Cancellation Note  Patient Details Name: Denise Kelly MRN: [id number] DOB: 10/16/1940   Cancelled Treatment:    Reason Eval/Treat Not Completed: Patient declined, no reason specified. Pt declined to participate with PT this afternoon. Pt reported she had not ordered or had lunch and then would need sufficient time to digest and possibly participate sometime after 4 pm. PT to return if time allows and continue to follow acutely.   [name], PT Acute Rehab   [name] 09/22/2022, 1:09 PM

## 2022-09-22 NOTE — Plan of Care (Signed)

## 2022-09-23 DIAGNOSIS — E876 Hypokalemia: Secondary | ICD-10-CM | POA: Diagnosis not present

## 2022-09-23 MED ORDER — TRAZODONE HCL 100 MG PO TABS: 100.0000 mg | ORAL_TABLET | Freq: Every evening | ORAL | 0 refills | Status: DC | PRN

## 2022-09-23 MED ORDER — HYDROXYZINE HCL 10 MG PO TABS: 10.0000 mg | ORAL_TABLET | Freq: Three times a day (TID) | ORAL | 0 refills | Status: DC | PRN

## 2022-09-23 MED ORDER — SORBITOL 70 % SOLN: 30.0000 mL | Freq: Every day | Status: DC | PRN

## 2022-09-23 MED ORDER — GABAPENTIN 100 MG PO CAPS: 100.0000 mg | ORAL_CAPSULE | Freq: Three times a day (TID) | ORAL | 0 refills | Status: DC

## 2022-09-23 NOTE — Plan of Care (Signed)
  Problem: Nutrition: Goal: Adequate nutrition will be maintained Outcome: Progressing   Problem: Coping: Goal: Level of anxiety will decrease Outcome: Progressing   Problem: Elimination: Goal: Will not experience complications related to bowel motility Outcome: Progressing Goal: Will not experience complications related to urinary retention Outcome: Progressing   

## 2022-09-23 NOTE — Progress Notes (Signed)
Physical Therapy Treatment Patient Details Name: ANAPATRICIA PLUEGER MRN: [id number] DOB: Aug 25, 1940 Today's Date: 09/23/2022   History of Present Illness 82 y.o. female with medical history significant for history of PE on Eliquis, lymphedema, cellulitis, chronic pain syndrome, medical noncompliance, L and R TKAs, lumbar fusion  who was found in her room covered in diarrhea.  The patient lives in an independent living facility facility and staff went to check on her because she had not been seen in a couple of days.  Pt admitted 09/11/22 for severe hypokalemia    PT Comments   Pt admitted with above diagnosis.  Pt currently with functional limitations due to the deficits listed below (see PT Problem List). PT approached pt in late am per pt preferred tx time today, PT requesting a window of time vs specifics to maximize ability to accommodate pt needs. Pt agreeable to between 2-3pm. PT able to arrive during pt requested time, pt agreeable to therapy intervention. Pt required min A to complete supine to sit to scoot R hip toward EOB. Pt required min guard and cues for sit to stand from EOB. Pt required min guard for gait tasks with cues for posture, proper distance from RW and encouragement for 80 feet. Pt agreeable to remaining OOB and seated in recliner with all needs in place. PT discussed concerns with d/c planning, pt will require increased assist and supervision in home setting for medication management and ADLs, pt reported able to hire caregiver assist from [facility] if returns to ILF. Pt will benefit from ongoing therapy services in ILF setting.  Pt will benefit from acute skilled PT to increase their independence and safety with mobility to allow discharge.       Assistance Recommended at Discharge Frequent or constant Supervision/Assistance  If plan is discharge home, recommend the following:  Can travel by private vehicle    A lot of help with walking and/or transfers;A lot of help with  bathing/dressing/bathroom;Assistance with cooking/housework;Assist for transportation;Direct supervision/assist for medications management;Help with stairs or ramp for entrance;Direct supervision/assist for financial management   No  Equipment Recommendations  None recommended by PT    Recommendations for Other Services       Precautions / Restrictions Precautions Precautions: Fall Restrictions Weight Bearing Restrictions: No     Mobility  Bed Mobility Overal bed mobility: Needs Assistance Bed Mobility: Supine to Sit     Supine to sit: Min assist     General bed mobility comments: pt required increased time and HOB elevated with pt requiring min A to scoot R hip to EOB    Transfers Overall transfer level: Needs assistance Equipment used: Rolling walker (2 wheels) Transfers: Sit to/from Stand Sit to Stand: Min guard           General transfer comment: cues for correct hand placement and to power up with LEs. pt indicated being fearfull of falling    Ambulation/Gait Ambulation/Gait assistance: Min guard Gait Distance (Feet): 80 Feet Assistive device: Rolling walker (2 wheels) Gait Pattern/deviations: Decreased stride length, Wide base of support, Step-to pattern Gait velocity: decreased     General Gait Details: verbal cues for posture and proximity to RW and encouragement   Stairs             Wheelchair Mobility     Tilt Bed    Modified Rankin (Stroke Patients Only)       Balance Overall balance assessment: Mild deficits observed, not formally tested, Needs assistance Sitting-balance support: Feet supported  Standing balance support: During functional activity, Reliant on assistive device for balance Standing balance-Leahy Scale: Fair                              Cognition Arousal/Alertness: Awake/alert Behavior During Therapy: WFL for tasks assessed/performed Overall Cognitive Status: History of cognitive impairments  - at baseline Area of Impairment: Safety/judgement, Awareness, Problem solving                         Safety/Judgement: Decreased awareness of safety, Decreased awareness of deficits Awareness: Emergent Problem Solving: Slow processing, Decreased initiation, Requires verbal cues General Comments: patient is at her baseline. has h/o confusion. [facility]        Exercises      General Comments        Pertinent Vitals/Pain Pain Assessment Pain Assessment: Faces Faces Pain Scale: Hurts little more Pain Location: BLE and lower back Pain Descriptors / Indicators: Guarding, Constant Pain Intervention(s): Limited activity within patient's tolerance, Monitored during session    Home Living                          Prior Function            PT Goals (current goals can now be found in the care plan section) Acute Rehab PT Goals Patient Stated Goal: go home PT Goal Formulation: With patient Time For Goal Achievement: 09/27/22 Potential to Achieve Goals: Fair Progress towards PT goals: Progressing toward goals    Frequency    Min 1X/week      PT Plan Discharge plan needs to be updated    Co-evaluation              AM-PAC PT "6 Clicks" Mobility   Outcome Measure  Help needed turning from your back to your side while in a flat bed without using bedrails?: A Little Help needed moving from lying on your back to sitting on the side of a flat bed without using bedrails?: A Little Help needed moving to and from a bed to a chair (including a wheelchair)?: A Little Help needed standing up from a chair using your arms (e.g., wheelchair or bedside chair)?: A Little Help needed to walk in hospital room?: A Little Help needed climbing 3-5 steps with a railing? : A Little 6 Click Score: 18    End of Session Equipment Utilized During Treatment: Gait belt Activity Tolerance: Patient limited by fatigue Patient left: with chair alarm set;in chair;with call  bell/phone within reach Nurse Communication: Mobility status PT Visit Diagnosis: Muscle weakness (generalized) (M62.81)     Time: 1761-6073 PT Time Calculation (min) (ACUTE ONLY): 29 min  Charges:    $Gait Training: 8-22 mins $Therapeutic Activity: 8-22 mins PT General Charges $$ ACUTE PT VISIT: 1 Visit                     [name], PT Acute Rehab    [name] 09/23/2022, 4:50 PM

## 2022-09-23 NOTE — Progress Notes (Signed)
See separate charting from physical therapy-patient walked 80 feet with assistance-we will be discharging to [facility] greens-see appended discharge summary and will be amended by physician tomorrow who discharges her  Pleas Koch, MD [facility] 5:33 PM

## 2022-09-23 NOTE — Discharge Summary (Signed)
Physician Discharge Summary  KANAIYA NEWBOLD ZOX:[id number] DOB: 1940-08-27 DOA: 09/11/2022  PCP: Pcp, No  Admit date: 09/11/2022 Discharge date: 09/23/2022  Time spent: 33 minutes  Recommendations for Outpatient Follow-up:  Needs Chem-12 CBC in about 1 week Suggest de-escalation of polypharmacy in the outpatient setting as best possible Might benefit from workup of hypodensity 1.6 cm in the right thyroid lobe if not done previously  Discharge Diagnoses:  MAIN problem for hospitalization   Toxic metabolic encephalopathy secondary to polypharmacy Severe hypokalemia from diarrhea which is now resolved Klebsiella urinary infection on admission without sepsis Questionable lower extremity cellulitis most Stoioff stasis dermatitis with chronic lymphedema Hypertension History of PE DVT High risk polypharmacy  Please see below for itemized issues addressed in HOpsital- refer to other progress notes for clarity if needed  Discharge Condition: Improved  Diet recommendation: Heart healthy  Filed Weights   09/11/22 1944  Weight: 90.7 kg    History of present illness:  82 year old white female ILF dwelling at [facility] greens HF PEF 60-65% Prior pulmonary embolism and DVT on Eliquis-submassive PE 12/2017 Hypodensity 1.6 cm right thyroid lobe Lymphedema--- chronic bilateral lower extremity pain followed by Dr. Pascal Lux (ABI and other workup -8/17) Prior L TKR, L THR previously on suppressive therapy on Keflex followed by infectious disease L4-L5 decompressive laminectomy 07/2011 Dr. Wynetta Emery note Bipolar HTN Restless leg syndrome UGIB while on Eliquis 2019--endoscopy 2019 diminutive MW tear with gastric ulcer that was clipped and placed on Protonix   Staff went to check on her at her ILF as she had not been seen for couple of days she had terrible diarrhea appetite is at baseline workup was revealing for severe hypokalemia with potassium less than 2  Hospital Course:  Cognitive Impairment ,  possibly 2/2 Polypharmacy---Confounded by extreme hard of hearing Mild memory issues--much more agreeable overall and seems more coherent than since admitted Absolutely refuses to go to skilled rehab-she in my opinion has capacity to make this decision she is more coherent than she has been-this has been discussed with the legal guardian by social work-she had significant polypharmacy on admission which is now resolved and we have de-escalated meds and I think as long as someone can administer her medications to her and a physician can down titrate some of them she can be safely discharged to her ILF -She is no longer really disoriented-we gave her Zydis only 1 time and she has been calm since  Severe Hypokalemia 2/2 acute diarrheal illness,? chronic diuretic therapy with lasix (increased to BID lasix in March give her LE edema, though she hasn't taken this in 10 days) Potassium improved with replacement-stop replacement -Goal mag >2 --Periodic labs as outpatient   Klebsiella Pneumoniae UTI Rx prior with duricef which covered her skin as well dysuria is resolved she is stable   Diarrhea Had this on admission seems resolved workup was negative   Lower Extremity Cellulitis Likely has venous stasis changes? represent cellulitis---only scabbing to LE's now Received a total of 6 days abx---improved   Lymphedema  Chronic Lower Extremity Pain gabapentin, tylenol from prior to admission continued (not taking cymbalta or tramadol) Needs outpatient lymphedema care and follow-up with Dr. Pascal Lux who I will CC   Hx Etoh Abuse Ciwa has been negative , d'c protocol 7/3   Hypertension Not on any meds for this--minimize meds and consider clonidine as a prn in the OP setting   Hx PE and DVT Eliquis 5 twice daily will need to be on this lifelong  1.6 cm thyroid lobe nodule  She has a small nodule which needs to be worked up eventually as an outpatient  High risk polypharmacy ?which medicines she's  taking.  The only one she was able to confidently report to me was eliquis.  Will need skilled med management     Discharge Exam: Vitals:   09/23/22 0549 09/23/22 1325  BP: (!) 141/70 131/67  Pulse: 92 88  Resp: 17 16  Temp: 97.7 F (36.5 C) 97.6 F (36.4 C)  SpO2: 97% 94%    General Exam on discharge  Awake coherent no distress looks comfortable feels okay Making sense talking clearly-worked with therapy earlier today Chest clear no rales rhonchi Mild lower extremity edema some scabbing etc.  Discharge Instructions   Discharge Instructions     Diet - low sodium heart healthy   Complete by: As directed    Increase activity slowly   Complete by: As directed    No wound care   Complete by: As directed       Allergies as of 09/23/2022       Reactions   Sulfa Antibiotics Hives, Swelling, Other (See Comments)   Facial/eye swelling    Coreg [carvedilol] Other (See Comments)   Memory loss   Motrin [ibuprofen] Palpitations   Toprol Xl [metoprolol Tartrate] Cough   Doxycycline Hyclate Other (See Comments)   HEARTBURN   Flovent Hfa [fluticasone] Itching   Statins Other (See Comments)   MENTAL STATUS CHANGE        Medication List     STOP taking these medications    DULoxetine 30 MG capsule Commonly known as: CYMBALTA   furosemide 40 MG tablet Commonly known as: LASIX   multivitamin with minerals Tabs tablet   pantoprazole 40 MG tablet Commonly known as: PROTONIX   polyethylene glycol 17 g packet Commonly known as: MIRALAX / GLYCOLAX   Potassium Chloride ER 20 MEQ Tbcr   senna-docusate 8.6-50 MG tablet Commonly known as: Senokot-S       TAKE these medications    acetaminophen 325 MG tablet Commonly known as: TYLENOL Take 2 tablets (650 mg total) by mouth 2 (two) times daily.   cyanocobalamin 1000 MCG tablet Take 1 tablet (1,000 mcg total) by mouth daily.   Eliquis 5 MG Tabs tablet Generic drug: apixaban Take 5 mg by mouth 2 (two) times  daily.   gabapentin 100 MG capsule Commonly known as: NEURONTIN Take 1 capsule (100 mg total) by mouth 3 (three) times daily.   hydrOXYzine 10 MG tablet Commonly known as: ATARAX Take 1 tablet (10 mg total) by mouth 3 (three) times daily as needed for itching.   melatonin 3 MG Tabs tablet Take 1 tablet (3 mg total) by mouth at bedtime.   ondansetron 4 MG tablet Commonly known as: ZOFRAN Take 4 mg by mouth every 8 (eight) hours as needed for nausea or vomiting.   sorbitol 70 % Soln Take 30 mLs by mouth daily as needed for moderate constipation.   traZODone 100 MG tablet Commonly known as: DESYREL Take 1 tablet (100 mg total) by mouth at bedtime as needed for sleep.       Allergies  Allergen Reactions   Sulfa Antibiotics Hives, Swelling and Other (See Comments)    Facial/eye swelling    Coreg [Carvedilol] Other (See Comments)    Memory loss   Motrin [Ibuprofen] Palpitations   Toprol Xl [Metoprolol Tartrate] Cough   Doxycycline Hyclate Other (See Comments)    HEARTBURN  Flovent Hfa [Fluticasone] Itching   Statins Other (See Comments)    MENTAL STATUS CHANGE      The results of significant diagnostics from this hospitalization (including imaging, microbiology, ancillary and laboratory) are listed below for reference.    Significant Diagnostic Studies: [place] EKG SITE RITE  Result Date: 09/12/2022 If Site Rite image not attached, placement could not be confirmed due to current cardiac rhythm.   Microbiology: No results found for this or any previous visit (from the past 240 hour(s)).   Labs: Basic Metabolic Panel: Recent Labs  Lab 09/17/22 0427 09/21/22 0404 09/22/22 0522  NA 139 136 142  K 4.7 3.1* 4.7  CL 104 107 108  CO2 28 23 29   GLUCOSE 112* 121* 113*  BUN 14 17 17   CREATININE 0.63 0.67 0.87  CALCIUM 8.6* 8.3* 8.7*  MG 2.0  --  2.1  PHOS 4.4  --   --    Liver Function Tests: Recent Labs  Lab 09/17/22 0427  AST 19  ALT 14  ALKPHOS 74  BILITOT  0.6  PROT 6.0*  ALBUMIN 2.9*   No results for input(s): "LIPASE", "AMYLASE" in the last 168 hours. No results for input(s): "AMMONIA" in the last 168 hours. CBC: Recent Labs  Lab 09/17/22 0427  WBC 5.8  NEUTROABS 2.6  HGB 14.2  HCT 43.1  MCV 90.2  PLT 172   Cardiac Enzymes: No results for input(s): "CKTOTAL", "CKMB", "CKMBINDEX", "TROPONINI" in the last 168 hours. BNP: BNP (last 3 results) Recent Labs    04/05/22 2359 04/28/22 1549 05/20/22 2259  BNP 29.8 32.5 12.2    ProBNP (last 3 results) No results for input(s): "PROBNP" in the last 8760 hours.  CBG: No results for input(s): "GLUCAP" in the last 168 hours.     Signed:  Rhetta Mura MD   [facility] 09/23/2022, 5:37 PM

## 2022-09-23 NOTE — TOC Progression Note (Signed)
Transition of Care [facility]) - Progression Note    Patient Details  Name: Denise Kelly MRN: [id number] Date of Birth: 1940-04-12  Transition of Care [facility]) CM/SW Contact  Amada [place], [place] Phone Number: 09/23/2022, 4:04 PM  Clinical Narrative:    Met with patient this afternoon following PT session.  Per PT report, pt participating and overall min-guard with ambulation.  Again, revisited with pt the recommendation for SNF and she continues to refuse.  Pt insists that she will return to her IL apt at [facility] and adamantly states that she does not want me to speak with her HCPOA, [place] "at all."   Have left a VM with [facility] [place] admissions coordinator and await call back to discuss pt's return.  (Had spoken with her yesterday and was told that they would allow pt to return if she chooses.)  MD and RN aware.   Expected Discharge Plan: Skilled Nursing Facility Barriers to Discharge: Continued Medical Work up  Expected Discharge Plan and Services In-house Referral: Clinical Social Work     Living arrangements for the past 2 months: Assisted Living Facility                                       Social Determinants of Health (SDOH) Interventions SDOH Screenings   Food Insecurity: No Food Insecurity (09/12/2022)  Housing: Low Risk  (09/12/2022)  Transportation Needs: No Transportation Needs (09/12/2022)  Utilities: Not At Risk (09/12/2022)  Depression (PHQ2-9): Low Risk  (01/09/2019)  Tobacco Use: Medium Risk (09/11/2022)    Readmission Risk Interventions    05/02/2022   11:19 AM  Readmission Risk Prevention Plan  Transportation Screening Complete  Medication Review (RN Care Manager) Complete  PCP or Specialist appointment within 3-5 days of discharge Complete  HRI or Home Care Consult Complete  SW Recovery Care/Counseling Consult Complete  Palliative Care Screening Not Applicable  Skilled Nursing Facility Complete

## 2022-09-24 ENCOUNTER — Emergency Department (HOSPITAL_COMMUNITY)
Admission: EM | Admit: 2022-09-24 | Discharge: 2022-10-02 | Disposition: A | Discharge status: Home / Self Care | Payer: Medicare (Managed Care) | Attending: Emergency Medicine | Admitting: Emergency Medicine

## 2022-09-24 DIAGNOSIS — M6281 Muscle weakness (generalized): Secondary | ICD-10-CM | POA: Diagnosis not present

## 2022-09-24 DIAGNOSIS — N3000 Acute cystitis without hematuria: Secondary | ICD-10-CM | POA: Diagnosis not present

## 2022-09-24 DIAGNOSIS — R5383 Other fatigue: Secondary | ICD-10-CM

## 2022-09-24 DIAGNOSIS — Z7901 Long term (current) use of anticoagulants: Secondary | ICD-10-CM | POA: Insufficient documentation

## 2022-09-24 DIAGNOSIS — R531 Weakness: Secondary | ICD-10-CM | POA: Insufficient documentation

## 2022-09-24 DIAGNOSIS — E876 Hypokalemia: Secondary | ICD-10-CM | POA: Diagnosis not present

## 2022-09-24 MED ORDER — HYDROXYZINE HCL 10 MG PO TABS
10.0000 mg | ORAL_TABLET | Freq: Three times a day (TID) | ORAL | Status: DC | PRN
  Administered 2022-09-26 – 2022-10-01 (×6): 10 mg via ORAL
  Filled 2022-09-24 (×6): qty 1

## 2022-09-24 MED ORDER — ONDANSETRON HCL 4 MG PO TABS
4.0000 mg | ORAL_TABLET | Freq: Three times a day (TID) | ORAL | Status: DC | PRN
  Filled 2022-09-24: qty 1

## 2022-09-24 MED ORDER — APIXABAN 5 MG PO TABS
5.0000 mg | ORAL_TABLET | Freq: Two times a day (BID) | ORAL | Status: DC
  Administered 2022-09-25 – 2022-10-02 (×16): 5 mg via ORAL
  Filled 2022-09-24 (×21): qty 1

## 2022-09-24 MED ORDER — SORBITOL 70 % SOLN: 30.0000 mL | Freq: Every day | Status: DC | PRN

## 2022-09-24 MED ORDER — MELATONIN 3 MG PO TABS
3.0000 mg | ORAL_TABLET | Freq: Every day | ORAL | Status: DC
  Administered 2022-09-25 – 2022-10-01 (×7): 3 mg via ORAL
  Filled 2022-09-24 (×7): qty 1

## 2022-09-24 MED ORDER — VITAMIN B-12 1000 MCG PO TABS
1000.0000 ug | ORAL_TABLET | Freq: Every day | ORAL | Status: DC
  Administered 2022-09-25 – 2022-10-02 (×8): 1000 ug via ORAL
  Filled 2022-09-24 (×9): qty 1

## 2022-09-24 MED ORDER — GABAPENTIN 100 MG PO CAPS
100.0000 mg | ORAL_CAPSULE | Freq: Three times a day (TID) | ORAL | Status: DC
  Administered 2022-09-25 – 2022-10-02 (×23): 100 mg via ORAL
  Filled 2022-09-24 (×22): qty 1

## 2022-09-24 MED ORDER — ACETAMINOPHEN 325 MG PO TABS
650.0000 mg | ORAL_TABLET | Freq: Two times a day (BID) | ORAL | Status: DC
  Administered 2022-09-25 (×2): 650 mg via ORAL
  Filled 2022-09-24 (×2): qty 2

## 2022-09-24 MED ORDER — TRAZODONE HCL 100 MG PO TABS
100.0000 mg | ORAL_TABLET | Freq: Every evening | ORAL | Status: DC | PRN
  Administered 2022-09-25 – 2022-10-02 (×6): 100 mg via ORAL
  Filled 2022-09-24 (×6): qty 1

## 2022-09-24 NOTE — Discharge Summary (Signed)
Physician Discharge Summary  Denise Kelly ZOX:[id number] DOB: 1941-01-05 DOA: 09/11/2022  PCP: Pcp, No  Admit date: 09/11/2022 Discharge date: 09/24/2022  Time spent: 40 minutes  Recommendations for Outpatient Follow-up:  Follow outpatient CBC/CMP  She'll need caregivers in ILF - recommend [facility] to follow up and set this up for her outpatient (recommend at least AM/PM caregivers as she returns home from hospitalization, long term plan depending on needs as assessed by Barrett Henle) Follow cognitive status outpatient - neurology referral may be helpful Reassess meds with PCP as outpatient, deprescribe as appropriate to limit risk of polypharmacy Follow thyroid nodule outpatient if not previously worked up   Discharge Diagnoses:  Principal Problem:   Hypokalemia Active Problems:   LAFB (left anterior fascicular block)   Alcohol dependence (HCC)   RLS (restless legs syndrome)   Chronic pain syndrome   Lymphedema   History of pulmonary embolus (PE)   Discharge Condition: stable  Diet recommendation: heart healthy  Filed Weights   09/11/22 1944  Weight: 90.7 kg    History of present illness:   Denise Kelly is Denise Kelly 82 y.o. female with medical history significant for history of PE on Eliquis, lymphedema and chronic lower extremity pain who was found in her room covered in diarrhea.  The patient lives in an independent living facility facility and staff went to check on her because she had not been seen in Denise Kelly couple of days.  The patient reports that she had terrible diarrhea for approximately 3 days.  But the diarrhea has now resolved.  She has been diarrhea free for the last 2 days and she denies any pain or fever.  Her appetite is at baseline and she has been eating.  She denies any fevers or chest pain.  The patient was brought into the emergency department by EMS.  Her workup revealed severe hypokalemia with potassium less than 2.  She's been treated for severe  hypokalemia as well as cellulitis and Denise Kelly UTI.  Her hospitalization was complicated by confusion which seems improved on the day of discharge.  We recommended SNF care at discharge, but she refused.    See below for additional details   Hospital Course:  Assessment and Plan:  Cognitive Impairment  Acute Metabolic Encephalopathy Hard of Hearing  Seems to have some memory issues.  Improved on the day of discharge.  Related to severe hypokalemia, UTI/cellulitis (infection), medication related?   She's Denise Kelly&O on day of discharge.  She warrants outpatient neurology follow up.  She'll need caregivers for assistance with medications, etc.  She was agreeable to having caregivers when I spoke with her today (therapy recommended at least AM/PM).   Severe Hypokalemia Likely related to acute diarrheal illness, but possibly also related to chronic diuretic therapy with lasix (increased to BID lasix in March give her LE edema, though she hasn't taken this in 10 days) Resolved Lasix discontinued at discharge   Klebsiella Pneumoniae UTI Treated with duricef   Diarrhea resolved   Lower Extremity Cellulitis Likely has venous stasis changes, but erythema and tenderness could represent cellulitis S/p treatment with duricef   Lymphedema  Chronic Lower Extremity Pain gabapentin, tylenol (doesn't appear she's taking cymbalta or tramadol) Needs outpatient lymphedema care   Hx Etoh Abuse noted   Hypertension Not on any meds for this   Hx PE and DVT Eliquis    1.6 cm thyroid lobe nodule  She has Denise Kelly small nodule which needs to be worked up eventually as an  outpatient (looks like this was noted in 2019, ensure she's had follow up outpatient)  At risk for polypharmacy  Not clear to me which medicines she was taking prior to admission.  The only one she was able to confidently report to me was eliquis.  Will need assistance with meds at home.  Continue to evaluate with PCP, what can be discontinued.        Procedures: none   Consultations: none  Discharge Exam: Vitals:   09/23/22 2019 09/24/22 0642  BP: 123/64 113/63  Pulse: 97 89  Resp: 18 18  Temp: 97.6 F (36.4 C) 97.9 F (36.6 C)  SpO2: 96% 93%   Denise Kelly&Ox3 Has been watching Denise Kelly, cheering for Denise Kelly - thinks she'll win She's says she'll be agreeable to having Denise Kelly caregiver to her ILF  General: No acute distress. Lungs: unlabored Neurological: Alert and oriented 3. Moves all extremities 4. Cranial nerves II through XII grossly intact. Skin: scattered bruising, excoriations in various states of healing Extremities: No clubbing or cyanosis. No edema.  Discharge Instructions   Discharge Instructions     Diet - low sodium heart healthy   Complete by: As directed    Discharge instructions   Complete by: As directed    You were seen for low potassium levels.  You also were treated for Denise Kelly skin infection and Denise Kelly UTI.  You've improved with potassium replacement and antibiotics.  You need continued care.  We recommended SNF (skilled nursing facility) for rehab (or ALF - assisted living facility), but you've declined this.  You need in home care givers as you return to your independent living facility.  We'll arrange home health for you.  Please review your medicines with your outpatient doctor (discuss what to continue and what might be discontinued).  You have Denise Kelly thyroid nodule that was noted in 2019 that needs outpatient follow up (if this hasn't been done).  Please discuss this with your PCP.  Return for new, recurrent, or worsening symptoms.  Please ask your PCP to request records from this hospitalization so they know what was done and what the next steps will be.   Increase activity slowly   Complete by: As directed    No wound care   Complete by: As directed       Allergies as of 09/24/2022       Reactions   Sulfa Antibiotics Hives, Swelling, Other (See Comments)   Facial/eye swelling    Coreg [carvedilol]  Other (See Comments)   Memory loss   Motrin [ibuprofen] Palpitations   Toprol Xl [metoprolol Tartrate] Cough   Doxycycline Hyclate Other (See Comments)   HEARTBURN   Flovent Hfa [fluticasone] Itching   Statins Other (See Comments)   MENTAL STATUS CHANGE        Medication List     STOP taking these medications    DULoxetine 30 MG capsule Commonly known as: CYMBALTA   furosemide 40 MG tablet Commonly known as: LASIX   multivitamin with minerals Tabs tablet   pantoprazole 40 MG tablet Commonly known as: PROTONIX   polyethylene glycol 17 g packet Commonly known as: MIRALAX / GLYCOLAX   Potassium Chloride ER 20 MEQ Tbcr   senna-docusate 8.6-50 MG tablet Commonly known as: Senokot-S       TAKE these medications    acetaminophen 325 MG tablet Commonly known as: TYLENOL Take 2 tablets (650 mg total) by mouth 2 (two) times daily.   cyanocobalamin 1000 MCG tablet Take 1 tablet (1,000 mcg  total) by mouth daily.   Eliquis 5 MG Tabs tablet Generic drug: apixaban Take 5 mg by mouth 2 (two) times daily.   gabapentin 100 MG capsule Commonly known as: NEURONTIN Take 1 capsule (100 mg total) by mouth 3 (three) times daily.   hydrOXYzine 10 MG tablet Commonly known as: ATARAX Take 1 tablet (10 mg total) by mouth 3 (three) times daily as needed for itching.   melatonin 3 MG Tabs tablet Take 1 tablet (3 mg total) by mouth at bedtime.   ondansetron 4 MG tablet Commonly known as: ZOFRAN Take 4 mg by mouth every 8 (eight) hours as needed for nausea or vomiting.   sorbitol 70 % Soln Take 30 mLs by mouth daily as needed for moderate constipation.   traZODone 100 MG tablet Commonly known as: DESYREL Take 1 tablet (100 mg total) by mouth at bedtime as needed for sleep.       Allergies  Allergen Reactions   Sulfa Antibiotics Hives, Swelling and Other (See Comments)    Facial/eye swelling    Coreg [Carvedilol] Other (See Comments)    Memory loss   Motrin  [Ibuprofen] Palpitations   Toprol Xl [Metoprolol Tartrate] Cough   Doxycycline Hyclate Other (See Comments)    HEARTBURN   Flovent Hfa [Fluticasone] Itching   Statins Other (See Comments)    MENTAL STATUS CHANGE      The results of significant diagnostics from this hospitalization (including imaging, microbiology, ancillary and laboratory) are listed below for reference.    Significant Diagnostic Studies: [place] EKG SITE RITE  Result Date: 09/12/2022 If Site Rite image not attached, placement could not be confirmed due to current cardiac rhythm.   Microbiology: No results found for this or any previous visit (from the past 240 hour(s)).   Labs: Basic Metabolic Panel: Recent Labs  Lab 09/21/22 0404 09/22/22 0522  NA 136 142  K 3.1* 4.7  CL 107 108  CO2 23 29  GLUCOSE 121* 113*  BUN 17 17  CREATININE 0.67 0.87  CALCIUM 8.3* 8.7*  MG  --  2.1   Liver Function Tests: No results for input(s): "AST", "ALT", "ALKPHOS", "BILITOT", "PROT", "ALBUMIN" in the last 168 hours. No results for input(s): "LIPASE", "AMYLASE" in the last 168 hours. No results for input(s): "AMMONIA" in the last 168 hours. CBC: No results for input(s): "WBC", "NEUTROABS", "HGB", "HCT", "MCV", "PLT" in the last 168 hours. Cardiac Enzymes: No results for input(s): "CKTOTAL", "CKMB", "CKMBINDEX", "TROPONINI" in the last 168 hours. BNP: BNP (last 3 results) Recent Labs    04/05/22 2359 04/28/22 1549 05/20/22 2259  BNP 29.8 32.5 12.2    ProBNP (last 3 results) No results for input(s): "PROBNP" in the last 8760 hours.  CBG: No results for input(s): "GLUCAP" in the last 168 hours.     Signed:  Lacretia Nicks MD.  [facility] 09/24/2022, 10:34 AM

## 2022-09-24 NOTE — TOC Transition Note (Addendum)
Transition of Care [facility]) - CM/SW Discharge Note   Patient Details  Name: Denise Kelly MRN: [id number] Date of Birth: 1940-06-23  Transition of [facility]) CM/SW Contact:  Amada Jupiter, LCSW Phone Number: 09/24/2022, 11:10 AM   Clinical Narrative:    Pt has been medically cleared for discharge today.  Pt states she will return to her [place] apt at [facility] and facility has confirmed they will accept back.  Pt and facility aware that HHPT/ OT is recommended as well as caregiver assist in morning and evening.  Referral sent to [facility] for the therapy services.  [facility] to meet with pt upon her return today to get her agreement to increase caregiver services initially (as this will be added cost to pt).  [facility] to provide discharge transportation back to her apartment and will be here at 12 noon.  MD/ RN aware.  No further TOC needs.   Final next level of care: Other (comment) ([place] apt at [facility]) Barriers to Discharge: Barriers Resolved   Patient Goals and CMS Choice      Discharge Placement                         Discharge Plan and Services Additional resources added to the After Visit Summary for   In-house Referral: Clinical Social Work              DME Arranged: N/A DME Agency: NA       HH Arranged: PT, OT HH Agency: Other - See comment Transport planner at [place]) Date HH Agency Contacted: 09/24/22 Time HH Agency Contacted: 1109 Representative spoke with at [facility] Agency: Rene Kocher  Social Determinants of Health (SDOH) Interventions SDOH Screenings   Food Insecurity: No Food Insecurity (09/12/2022)  Housing: Low Risk  (09/12/2022)  Transportation Needs: No Transportation Needs (09/12/2022)  Utilities: Not At Risk (09/12/2022)  Depression (PHQ2-9): Low Risk  (01/09/2019)  Tobacco Use: Medium Risk (09/11/2022)     Readmission Risk Interventions    05/02/2022   11:19 AM  Readmission Risk Prevention Plan  Transportation Screening  Complete  Medication Review (RN Care Manager) Complete  PCP or Specialist appointment within 3-5 days of discharge Complete  HRI or Home Care Consult Complete  SW Recovery Care/Counseling Consult Complete  Palliative Care Screening Not Applicable  Skilled Nursing Facility Complete

## 2022-09-24 NOTE — ED Triage Notes (Signed)
BIBA from [facility] for generalized weakness, incontinence- d/c this AM to a rehab facility, pt refused to go. Pt states she still needs help.

## 2022-09-24 NOTE — ED Provider Notes (Signed)
[place] EMERGENCY DEPARTMENT AT [facility] Provider Note   CSN: [id number] Arrival date & time: 09/24/22  2248     History  Chief Complaint  Patient presents with   Fatigue    Denise Kelly is a 82 y.o. female.  The history is provided by the patient and medical records.  Denise Kelly is a 82 y.o. female who presents to the Emergency Department complaining of placement.  She presents to the emergency department by EMS from [facility] independent living facility seeking nursing home placement for rehab.  She was discharged from the hospital earlier today following admission for hypokalemia secondary to severe diarrhea.  She states that she is feeling significantly improved from her hospitalization and her diarrhea has resolved but she is unable to care for herself in her current situation.  No reported fevers, chest pain, abdominal pain, nausea, vomiting.  Typically she is able to ambulate with a walker, but she is unable to get up at all at this point.  She does have a history of PE and is on anticoagulation.     Home Medications Prior to Admission medications   Medication Sig Start Date End Date Taking? Authorizing Provider  acetaminophen (TYLENOL) 325 MG tablet Take 2 tablets (650 mg total) by mouth 2 (two) times daily. 05/24/22   Shalhoub, Deno Lunger, MD  apixaban (ELIQUIS) 5 MG TABS tablet Take 5 mg by mouth 2 (two) times daily.    [provider]  cyanocobalamin 1000 MCG tablet Take 1 tablet (1,000 mcg total) by mouth daily. 05/03/22   Albertine Grates, MD  gabapentin (NEURONTIN) 100 MG capsule Take 1 capsule (100 mg total) by mouth 3 (three) times daily. 09/23/22   Rhetta Mura, MD  hydrOXYzine (ATARAX) 10 MG tablet Take 1 tablet (10 mg total) by mouth 3 (three) times daily as needed for itching. 09/23/22   Rhetta Mura, MD  melatonin 3 MG TABS tablet Take 1 tablet (3 mg total) by mouth at bedtime. Patient not taking: Reported on 09/12/2022 05/02/22    Albertine Grates, MD  ondansetron (ZOFRAN) 4 MG tablet Take 4 mg by mouth every 8 (eight) hours as needed for nausea or vomiting.    [provider]  sorbitol 70 % SOLN Take 30 mLs by mouth daily as needed for moderate constipation. 09/23/22   Rhetta Mura, MD  traZODone (DESYREL) 100 MG tablet Take 1 tablet (100 mg total) by mouth at bedtime as needed for sleep. 09/23/22   Rhetta Mura, MD      Allergies    Sulfa antibiotics, Coreg [carvedilol], Motrin [ibuprofen], Toprol xl [metoprolol tartrate], Doxycycline hyclate, Flovent hfa [fluticasone], and Statins    Review of Systems   Review of Systems  All other systems reviewed and are negative.   Physical Exam Updated Vital Signs BP (!) 139/91 (BP Location: Left Arm)   Pulse 100   Temp (!) 97.4 F (36.3 C) (Oral)   Resp 16   SpO2 98%  Physical Exam Vitals and nursing note reviewed.  Constitutional:      Appearance: She is well-developed.  HENT:     Head: Normocephalic and atraumatic.  Cardiovascular:     Rate and Rhythm: Normal rate and regular rhythm.  Pulmonary:     Effort: Pulmonary effort is normal. No respiratory distress.  Abdominal:     Palpations: Abdomen is soft.     Tenderness: There is no abdominal tenderness. There is no guarding or rebound.  Musculoskeletal:  General: No tenderness.  Skin:    General: Skin is warm and dry.  Neurological:     Mental Status: She is alert and oriented to person, place, and time.     Comments: Global weakness.  Psychiatric:        Behavior: Behavior normal.     ED Results / Procedures / Treatments   Labs (all labs ordered are listed, but only abnormal results are displayed) Labs Reviewed - No data to display  EKG None  Radiology No results found.  Procedures Procedures    Medications Ordered in ED Medications - No data to display  ED Course/ Medical Decision Making/ A&P                             Medical Decision Making Risk OTC  drugs. Prescription drug management.   Patient presents to the emergency department after being discharged from the hospital earlier today.  She was discharged to her independent living facility per patient request and when she arrived there she realized that she is unable to care for herself.  She does not have any acute medical complaints.  TOC consult placed for rehab placement.  Home medications were ordered.        Final Clinical Impression(s) / ED Diagnoses Final diagnoses:  None    Rx / DC Orders ED Discharge Orders     None         Tilden Fossa, MD 09/25/22 [phone]

## 2022-09-25 MED ORDER — ACETAMINOPHEN 325 MG PO TABS
650.0000 mg | ORAL_TABLET | Freq: Four times a day (QID) | ORAL | Status: DC | PRN
  Administered 2022-09-25 – 2022-10-01 (×10): 650 mg via ORAL
  Filled 2022-09-25 (×13): qty 2

## 2022-09-25 NOTE — Progress Notes (Signed)
This CSW presented bed offers to pt and pt requested this CSW outreach to [facility] who is listed as her HCPOA. [facility] selected [facility]. This CSW informed pt and pt is in agreement at this time. Notified Tammy about acceptance. SNF will initiate Auth.

## 2022-09-25 NOTE — [place] FL2 (Signed)
[place] MEDICAID FL2 LEVEL OF CARE FORM     IDENTIFICATION  Patient Name: Denise Kelly Birthdate: [birth year] Sex: female Admission Date (Current Location): 09/24/2022  [facility] and [place] Number:  Producer, television/film/video and Address:  [facility],  [address] [place], [place] [address]      Provider Number: [id number]  Attending Physician Name and Address:  Default, Provider, MD  Relative Name and Phone Number:       Current Level of Care: Hospital Recommended Level of Care: Skilled Nursing Facility Prior Approval Number:    Date Approved/Denied:   PASRR Number: [id number]  Discharge Plan:      Current Diagnoses: Patient Active Problem List   Diagnosis Date Noted   Bilateral lower extremity pain 05/21/2022   FTT (failure to thrive) in adult 05/01/2022   Chronic diastolic CHF (congestive heart failure) (HCC) 04/30/2022   GERD without esophagitis 04/30/2022   Right hip pain 04/30/2022   Generalized weakness 04/28/2022   Cellulitis 04/20/2022   Lymphedema 04/20/2022   Personal history of thromboembolic disease 56/21/3086   History of pulmonary embolus (PE) 04/20/2022   UTI (urinary tract infection) 01/13/2021   Acute metabolic encephalopathy 01/13/2021   Upper GI bleed 01/23/2018   Leukocytosis 01/23/2018   Physical deconditioning 01/23/2018   Acute pulmonary embolus (HCC) 01/04/2018   Acute encephalopathy 09/28/2017   Constipation, slow transit 09/28/2017   Unsteady gait 04/28/2017   Spondylolisthesis 07/17/2016   Lymphedema of both lower extremities 07/17/2016   Chronic pain syndrome    Depression 05/23/2016   Chronic back pain 02/25/2016   Lesion of liver 02/25/2016   History of fracture of left hip 11/28/2015   Fall 11/28/2015   RLS (restless legs syndrome) 11/28/2015   Hypokalemia 03/14/2014   Confusion 03/14/2014   Recurrent UTI 03/14/2014   Effusion of right knee 02/22/2014   Alcohol dependence (HCC) 02/22/2014   Substance induced  mood disorder (HCC) 02/22/2014   Hepatic encephalopathy (HCC)    Altered mental status 02/21/2014   SIRS (systemic inflammatory response syndrome) (HCC) 02/21/2014   S/P total knee arthroplasty 01/02/2014   Essential hypertension 11/10/2013   LAFB (left anterior fascicular block) 11/10/2013   Obesity, unspecified 11/10/2013    Orientation RESPIRATION BLADDER Height & Weight     Self, Time, Situation, Place  Normal Continent Weight:   Height:     BEHAVIORAL SYMPTOMS/MOOD NEUROLOGICAL BOWEL NUTRITION STATUS      Continent Diet (Regular)  AMBULATORY STATUS COMMUNICATION OF NEEDS Skin   Limited Assist Verbally Normal                       Personal Care Assistance Level of Assistance  Bathing, Feeding, Dressing Bathing Assistance: Limited assistance Feeding assistance: Independent Dressing Assistance: Limited assistance     Functional Limitations Info    Sight Info: Adequate Hearing Info: Adequate Speech Info: Adequate    SPECIAL CARE FACTORS FREQUENCY  PT (By licensed PT), OT (By licensed OT)     PT Frequency: x5/week OT Frequency: x5/week            Contractures Contractures Info: Not present    Additional Factors Info  Code Status, Allergies Code Status Info: Full Allergies Info: Sulfa Antibiotics  Coreg (Carvedilol)  Motrin (Ibuprofen)  Toprol Xl (Metoprolol Tartrate)  Doxycycline Hyclate  Flovent Hfa (Fluticasone)  Statins           Current Medications (09/25/2022):  This is the current hospital active medication list Current Facility-Administered Medications  Medication Dose Route Frequency Provider Last Rate Last Admin   acetaminophen (TYLENOL) tablet 650 mg  650 mg Oral BID Tilden Fossa, MD   650 mg at 09/25/22 0931   apixaban (ELIQUIS) tablet 5 mg  5 mg Oral BID Tilden Fossa, MD   5 mg at 09/25/22 0932   cyanocobalamin (VITAMIN B12) tablet 1,000 mcg  1,000 mcg Oral Daily Tilden Fossa, MD   1,000 mcg at 09/25/22 0931   gabapentin  (NEURONTIN) capsule 100 mg  100 mg Oral TID Tilden Fossa, MD   100 mg at 09/25/22 0932   hydrOXYzine (ATARAX) tablet 10 mg  10 mg Oral TID PRN Tilden Fossa, MD       melatonin tablet 3 mg  3 mg Oral QHS Tilden Fossa, MD       ondansetron [facility]) tablet 4 mg  4 mg Oral Q8H PRN Tilden Fossa, MD       sorbitol 70 % solution 30 mL  30 mL Oral Daily PRN Tilden Fossa, MD       traZODone (DESYREL) tablet 100 mg  100 mg Oral QHS PRN Tilden Fossa, MD   100 mg at 09/25/22 0014   Current Outpatient Medications  Medication Sig Dispense Refill   acetaminophen (TYLENOL) 325 MG tablet Take 2 tablets (650 mg total) by mouth 2 (two) times daily. 60 tablet 2   apixaban (ELIQUIS) 5 MG TABS tablet Take 5 mg by mouth 2 (two) times daily.     cyanocobalamin 1000 MCG tablet Take 1 tablet (1,000 mcg total) by mouth daily.     gabapentin (NEURONTIN) 100 MG capsule Take 1 capsule (100 mg total) by mouth 3 (three) times daily. 30 capsule 0   hydrOXYzine (ATARAX) 10 MG tablet Take 1 tablet (10 mg total) by mouth 3 (three) times daily as needed for itching. 30 tablet 0   ondansetron (ZOFRAN) 4 MG tablet Take 4 mg by mouth every 8 (eight) hours as needed for nausea or vomiting.     traZODone (DESYREL) 100 MG tablet Take 1 tablet (100 mg total) by mouth at bedtime as needed for sleep. 30 tablet 0   melatonin 3 MG TABS tablet Take 1 tablet (3 mg total) by mouth at bedtime. (Patient not taking: Reported on 09/12/2022)  0   sorbitol 70 % SOLN Take 30 mLs by mouth daily as needed for moderate constipation.       Discharge Medications: Please see discharge summary for a list of discharge medications.  Relevant Imaging Results:  Relevant Lab Results:   Additional Information SSN: [id number]  Princella Ion, LCSW

## 2022-09-25 NOTE — ED Notes (Signed)
Patient alert. Patient irritable with staff. Patient resistive to care. Patient needs assist with ADLs.

## 2022-09-25 NOTE — ED Notes (Signed)
Patient ambulated to bathroom.

## 2022-09-25 NOTE — Progress Notes (Addendum)
This CSW was contacted by CM at [facility] and informed that pt's previous SNF [facility] was denied.Per CM at [employer] (management for [employer] pt's), pt's previous denial was due to inadequate documentation to support SNF request. CM stated the denial must be appealed before a new auth can be started. This CSW informed pt. Pt requested this CSW "ask [facility]) what I should do." This CSW spoke with [facility] and informed of barrier and Hope is choosing to move forward with appeal at this time. Hope stated the pt does not have a physical bed at [facility] in her [place] due to it being soiled badly. She stated pt was in her recliner last evening before returning to the ER and that is also soiled. This CSW will initiate the appeal process.   [employer] Appeal Phone: [phone] Fax: [phone] Auth Denial #: [id number] Appeal may be initiated within 6 days of notice (notice was mailed to pt today 09/25/22)

## 2022-09-25 NOTE — Progress Notes (Signed)
Per chart review, pt was just discharged back to her [place] at [facility] on yesterday 7/10. Pt declined SNF on recent admission and opted to return to her [place]. This CSW noted that pt has a HCPOA. This CSW went to speak with pt to inquire about interest in rehab. Pt initially told this CSW to "go away." This CSW informed she needed to know if pt is interested in rehab if recommended. Pt stated "I guess so." Will request EDP order PT consult.

## 2022-09-25 NOTE — ED Provider Notes (Signed)
Emergency Medicine Observation Re-evaluation Note  Denise Kelly is a 82 y.o. female, seen on rounds today.  Pt initially presented to the ED for complaints of Fatigue Currently, the patient is awaiting placement.  Physical Exam  BP (!) 139/91 (BP Location: Left Arm)   Pulse 100   Temp (!) 97.4 F (36.3 C) (Oral)   Resp 16   SpO2 98%  Physical Exam General: Calm Cardiac: Well perfused Lungs: Even respirations  Psych: Calm  ED Course / MDM  EKG:   I have reviewed the labs performed to date as well as medications administered while in observation.  Recent changes in the last 24 hours include patient with mild hypoxemia with sleeping. No OSA diagnosis but suspect this clinically. No hypoxemia while awake. No respiratory distress. Patient would likely benefit from sleep study as an outpatient.   Plan  Current plan is for [facility] evaluation and placement.    Maia Plan, MD 09/25/22 1438

## 2022-09-25 NOTE — Progress Notes (Signed)
This CSW spoke with pt at bedside. Pt stated she is in agreement to go to SNF. This CSW has faxed pt out. Bed offers pending. Attempted to reach Kierstin at [facility]. Secretary took phone number and will have her return call. TOC following for bed offers.

## 2022-09-25 NOTE — Evaluation (Addendum)
Physical Therapy Evaluation Patient Details Name: Denise Kelly MRN: [id number] DOB: 29-Mar-1940 Today's Date: 09/25/2022  History of Present Illness  Denise Kelly is a 82 y.o. female who presents to the ED 7/10/24y requesting placement.Patient comes from  [facility] independent living facility.  She was discharged from the hospital earlier 09/24/22 following admission for hypokalemia secondary to severe diarrhea.  Clinical Impression  Pt admitted with above diagnosis.  Pt currently with functional limitations due to the deficits listed below (see PT Problem List). Pt will benefit from acute skilled PT to increase their independence and safety with mobility to allow discharge.    Patient agreeable to ambulate with RW x 45' x 2. Patient requiring assistance for ADL's, will benefit from SNF rehab.  SPO2 95% on RA.         Assistance Recommended at Discharge Frequent or constant Supervision/Assistance  If plan is discharge home, recommend the following:  Can travel by private vehicle  Assistance with cooking/housework;Assist for transportation;Direct supervision/assist for medications management;Help with stairs or ramp for entrance;Direct supervision/assist for financial management;A little help with walking and/or transfers;A little help with bathing/dressing/bathroom        Equipment Recommendations None recommended by PT  Recommendations for Other Services       Functional Status Assessment Patient has had a recent decline in their functional status and demonstrates the ability to make significant improvements in function in a reasonable and predictable amount of time.     Precautions / Restrictions Precautions Precautions: Fall Restrictions Weight Bearing Restrictions: No      Mobility  Bed Mobility   Bed Mobility: Supine to Sit, Sit to Supine     Supine to sit: Min guard Sit to supine: Min assist   General bed mobility comments: assist with legs onto  bed    Transfers Overall transfer level: Needs assistance Equipment used: Rolling walker (2 wheels) Transfers: Sit to/from Stand Sit to Stand: Min assist           General transfer comment: assist to power up from low toile, use of rail.    Ambulation/Gait Ambulation/Gait assistance: Min guard Gait Distance (Feet): 45 Feet x 2 Assistive device: Rolling walker (2 wheels) Gait Pattern/deviations: Decreased stride length, Wide base of support, Step-to pattern, Shuffle Gait velocity: decreased     General Gait Details: verbal cues for posture and proximity to RW and encouragement  Stairs            Wheelchair Mobility     Tilt Bed    Modified Rankin (Stroke Patients Only)       Balance Overall balance assessment: Mild deficits observed, not formally tested, Needs assistance Sitting-balance support: Feet supported       Standing balance support: During functional activity, Reliant on assistive device for balance Standing balance-Leahy Scale: Fair Standing balance comment: reliant on device for gait                             Pertinent Vitals/Pain Pain Assessment Faces Pain Scale: Hurts little more Pain Location: BLE and lower back Pain Descriptors / Indicators: Guarding, Constant Pain Intervention(s): Monitored during session    Home Living Family/patient expects to be discharged to:: Skilled nursing facility Living Arrangements: Alone   Type of Home: Independent living facility         Home Layout: One level Home Equipment: Agricultural consultant (2 wheels) Additional Comments: patient mentioned that she had some caregiver  support for cleaning services and that she gets her meals in the cafeteria. She also loves her cat "Wally"    Prior Function Prior Level of Function : Needs assist       Physical Assist : Mobility (physical);ADLs (physical) Mobility (physical): Gait ADLs (physical): Bathing;Dressing;IADLs Mobility Comments: uses  walker, reports being very fearful of falling ADLs Comments: ambulating wiith RW with assist at [facility] from [facility]     Hand Dominance   Dominant Hand: Right    Extremity/Trunk Assessment   Upper Extremity Assessment Upper Extremity Assessment: Generalized weakness    Lower Extremity Assessment Lower Extremity Assessment: Generalized weakness       Communication   Communication: HOH  Cognition Arousal/Alertness: Awake/alert Behavior During Therapy: WFL for tasks assessed/performed Overall Cognitive Status: History of cognitive impairments - at baseline Area of Impairment: Safety/judgement, Awareness, Problem solving                         Safety/Judgement: Decreased awareness of safety, Decreased awareness of deficits Awareness: Emergent Problem Solving: Slow processing, Decreased initiation, Requires verbal cues General Comments: patient is at her baseline. has h/o confusion. HOH, asking if she is going to rehab.        General Comments      Exercises     Assessment/Plan    PT Assessment Patient needs continued PT services  PT Problem List Decreased strength;Decreased activity tolerance;Decreased balance;Decreased mobility;Decreased coordination;Decreased cognition;Pain       PT Treatment Interventions DME instruction;Gait training;Functional mobility training;Therapeutic activities;Therapeutic exercise;Balance training;Neuromuscular re-education;Patient/family education;Modalities    PT Goals (Current goals can be found in the Care Plan section)  Acute Rehab PT Goals Patient Stated Goal: reahb PT Goal Formulation: With patient Time For Goal Achievement: 10/09/22 Potential to Achieve Goals: Fair    Frequency Min 1X/week     Co-evaluation               AM-PAC PT "6 Clicks" Mobility  Outcome Measure Help needed turning from your back to your side while in a flat bed without using bedrails?: A Little Help needed moving from lying on your back to  sitting on the side of a flat bed without using bedrails?: A Little Help needed moving to and from a bed to a chair (including a wheelchair)?: A Little Help needed standing up from a chair using your arms (e.g., wheelchair or bedside chair)?: A Little Help needed to walk in hospital room?: A Little Help needed climbing 3-5 steps with a railing? : A Lot 6 Click Score: 17    End of Session Equipment Utilized During Treatment: Gait belt Activity Tolerance: Patient tolerated treatment well Patient left: in bed;with call bell/phone within reach   PT Visit Diagnosis: Muscle weakness (generalized) (M62.81)    Time: 1610-9604 PT Time Calculation (min) (ACUTE ONLY): 25 min   Charges:   PT Evaluation $PT Eval Low Complexity: 1 Low PT Treatments $Gait Training: 8-22 mins PT General Charges $$ ACUTE PT VISIT: 1 Visit         Blanchard Kelch PT Acute Rehabilitation Services Office [phone] Weekend pager-[phone]   Rada Hay 09/25/2022, 11:24 AM

## 2022-09-25 NOTE — ED Notes (Signed)
Pts 02 was noted to be dropping during sleep. EDP was notified and pt was placed on 2 liters [place]

## 2022-09-26 NOTE — ED Notes (Signed)
Patient ask for something for pain. RN offered Tylenol. Patient stated "how stupid are you to give me tylenol that is not workingScience writer stated this is the only PRN medication prescribe for you at this time. Patient stated get out of my face I can't not understand you with your bad accent, go back to your country. RN stated you don't have to talk to people like that. Patient threw her pills in the floor.

## 2022-09-26 NOTE — Progress Notes (Addendum)
Appeal started with [facility]ise Kelly Appeal. This CSW faxed over initial ED provider note, progress notes, PT eval, FL2, and provider re-eval notes to [phone]; Attn: [facility] Appeals  [employer] Phone: [phone] Fax: [phone] Auth Denial #: [id number] Appeal may be initiated within 6 days of notice (notice was mailed to pt today 09/25/22)

## 2022-09-26 NOTE — Progress Notes (Addendum)
SNF to request new authorization at 9 AM EST when [employer] [phone]) opens.

## 2022-09-26 NOTE — ED Provider Notes (Signed)
Emergency Medicine Observation Re-evaluation Note  Denise Kelly is a 82 y.o. female, seen on rounds today.  Pt initially presented to the ED for complaints of Fatigue Currently, the patient is asleep.  Pt is awaiting SNF placement.  Physical Exam  BP 125/71 (BP Location: Right Arm)   Pulse 90   Temp 97.6 F (36.4 C) (Oral)   Resp 18   SpO2 95%  Physical Exam General: asleep Cardiac: rr Lungs: clear Psych: calm  ED Course / MDM  EKG:   I have reviewed the labs performed to date as well as medications administered while in observation.  Recent changes in the last 24 hours include none.  Plan  Current plan is for SNF.    Jacalyn Lefevre, MD 09/26/22 [phone]

## 2022-09-26 NOTE — ED Notes (Signed)
Pt remains asleep with no signs of distress or discomfort, respirations are easy and skin color is WNL for pt ethnicity. Sitter remains at bedside.

## 2022-09-27 ENCOUNTER — Other Ambulatory Visit: Payer: Self-pay

## 2022-09-27 LAB — URINALYSIS, W/ REFLEX TO CULTURE (INFECTION SUSPECTED)
Bilirubin Urine: NEGATIVE
Glucose, UA: NEGATIVE mg/dL
Ketones, ur: NEGATIVE mg/dL
Nitrite: POSITIVE — AB
Protein, ur: NEGATIVE mg/dL
Specific Gravity, Urine: 1.01 (ref 1.005–1.030)
pH: 8 (ref 5.0–8.0)

## 2022-09-27 MED ORDER — CEPHALEXIN 500 MG PO CAPS
500.0000 mg | ORAL_CAPSULE | Freq: Four times a day (QID) | ORAL | Status: DC
  Administered 2022-09-27 – 2022-10-02 (×19): 500 mg via ORAL
  Filled 2022-09-27 (×17): qty 1

## 2022-09-27 MED ORDER — CEPHALEXIN 500 MG PO CAPS: 500.0000 mg | ORAL_CAPSULE | Freq: Four times a day (QID) | ORAL | Status: DC

## 2022-09-27 MED ORDER — CEPHALEXIN 500 MG PO CAPS
500.0000 mg | ORAL_CAPSULE | Freq: Four times a day (QID) | ORAL | Status: DC
  Administered 2022-09-27: 500 mg via ORAL
  Filled 2022-09-27 (×2): qty 1

## 2022-09-27 NOTE — ED Notes (Signed)
Pt urine was noted to be dark and cloudy. UA sent off, and abx given for UTI. Barrier cream applied between buttocks.

## 2022-09-27 NOTE — ED Provider Notes (Addendum)
Emergency Medicine Observation Re-evaluation Note  Denise Kelly is a 82 y.o. female, seen on rounds today.  Pt initially presented to the ED for complaints of Fatigue Currently, the patient is resting.  Physical Exam  BP 124/76 (BP Location: Right Arm)   Pulse 97   Temp 97.7 F (36.5 C) (Oral)   Resp 18   Wt 90.7 kg   SpO2 97%   BMI 31.32 kg/m  Physical Exam General: NAD   ED Course / MDM  EKG:   I have reviewed the labs performed to date as well as medications administered while in observation.  Recent changes in the last 24 hours include no acute events reported.  Plan  Current plan is for placement.  UA obtained by nursing staff is concerning for UTI.  Patient ordered Keflex for 7 days.  Patient will need to complete entire course if placed prior to completion of course here.    Wynetta Fines, MD 09/27/22 [id number]    Wynetta Fines, MD 09/27/22 1435

## 2022-09-27 NOTE — Progress Notes (Signed)
Mobility Specialist Cancellation/Refusal Note:  Pt declined mobility 2x today (am & pm). Pt stated "oh hell no" & "I've had the worst time of my life". Will check back as schedule permits.    [facility]

## 2022-09-27 NOTE — Progress Notes (Signed)
Attempted to contact [employer] to check status but the office is closed on the weekend and will reopen on Monday.

## 2022-09-28 LAB — BASIC METABOLIC PANEL
Anion gap: 9 (ref 5–15)
BUN: 13 mg/dL (ref 8–23)
CO2: 26 mmol/L (ref 22–32)
Calcium: 8.7 mg/dL — ABNORMAL LOW (ref 8.9–10.3)
Chloride: 106 mmol/L (ref 98–111)
Creatinine, Ser: 0.67 mg/dL (ref 0.44–1.00)
GFR, Estimated: >60 mL/min (ref >60)
Glucose, Bld: 107 mg/dL — ABNORMAL HIGH (ref 70–99)
Potassium: 3.6 mmol/L (ref 3.5–5.1)
Sodium: 141 mmol/L (ref 135–145)

## 2022-09-28 LAB — CBC
HCT: 44.9 % (ref 36.0–46.0)
Hemoglobin: 14.4 g/dL (ref 12.0–15.0)
MCH: 29.6 pg (ref 26.0–34.0)
MCHC: 32.1 g/dL (ref 30.0–36.0)
MCV: 92.2 fL (ref 80.0–100.0)
Platelets: 199 10*3/uL (ref 150–400)
RBC: 4.87 MIL/uL (ref 3.87–5.11)
RDW: 15.2 % (ref 11.5–15.5)
WBC: 5.9 10*3/uL (ref 4.0–10.5)
nRBC: 0 % (ref 0.0–0.2)

## 2022-09-28 NOTE — ED Provider Notes (Signed)
Emergency Medicine Observation Re-evaluation Note  Denise Kelly is a 82 y.o. female, seen on rounds today.  Pt initially presented to the ED for complaints of Fatigue Currently, the patient is resting comfortably.  Physical Exam  BP (!) 144/84 (BP Location: Right Arm)   Pulse 88   Temp (!) 97.5 F (36.4 C) (Oral)   Resp 18   Wt 90.7 kg   SpO2 97%   BMI 31.32 kg/m  Physical Exam Vitals and nursing note reviewed.  Constitutional:      General: She is not in acute distress.    Appearance: She is well-developed.  HENT:     Head: Normocephalic and atraumatic.  Eyes:     Conjunctiva/sclera: Conjunctivae normal.  Cardiovascular:     Rate and Rhythm: Normal rate and regular rhythm.     Heart sounds: No murmur heard. Pulmonary:     Effort: Pulmonary effort is normal. No respiratory distress.  Musculoskeletal:        General: No swelling.     Cervical back: Neck supple.  Skin:    General: Skin is warm and dry.     Capillary Refill: Capillary refill takes less than 2 seconds.  Neurological:     Mental Status: She is alert.  Psychiatric:        Mood and Affect: Mood normal.     ED Course / MDM  EKG:   I have reviewed the labs performed to date as well as medications administered while in observation.  Recent changes in the last 24 hours include Keflex started for UTI by previous provider, pending placement by social work.  Plan  Current plan is for placement.    Glendora Score, MD 09/28/22 [phone]

## 2022-09-28 NOTE — ED Notes (Signed)
Provided pt soda and graham crackers. Pt declined to roll so draw sheet could be placed in order to slide her up in the bed. Pt frustrated at waiting for rehab.

## 2022-09-28 NOTE — ED Notes (Signed)
Patient very belligerent toward staff, patient stated she would report staff for the old bruises on her left forearm, patient yelled and refused to turn when asked, as staff had to clean/change her bottom of urine and feces.

## 2022-09-29 LAB — URINE CULTURE

## 2022-09-29 NOTE — ED Provider Notes (Signed)
Emergency Medicine Observation Re-evaluation Note  Denise Kelly is a 82 y.o. female, seen on rounds today.  Pt initially presented to the ED for complaints of Fatigue Currently, the patient is asleep.  Physical Exam  BP 104/69 (BP Location: Right Arm)   Pulse 90   Temp 97.6 F (36.4 C) (Oral)   Resp 18   Wt 90.7 kg   SpO2 95%   BMI 31.32 kg/m  Physical Exam General: Asleep in bed, no acute distress Cardiac: Regular rate Lungs: No increased work of breathing Psych: Calm, asleep  ED Course / MDM  EKG:   I have reviewed the labs performed to date as well as medications administered while in observation.  Recent changes in the last 24 hours include no significant change, being treated for UTI.  Plan  Current plan is for SNF placement.    Rexford Maus, DO 09/29/22 [phone]

## 2022-09-29 NOTE — Progress Notes (Addendum)
This CSW contacted [employer] (phone: [phone])to inquire about the status. Rosann Auerbach reported they will be sending over a form via fax for the pt to give consent to allow the hospital to speak on her behalf throughout the appeals process.   Addend @ 10:57  Faxed over Information systems manager form to [employer] (Fax:[phone]).

## 2022-09-29 NOTE — Progress Notes (Signed)
Physical Therapy Treatment Patient Details Name: Denise Kelly MRN: [id number] DOB: 1940/12/05 Today's Date: 09/29/2022   History of Present Illness Denise Kelly is a 82 y.o. female who presents to the ED 7/10/24y requesting placement.Patient comes from  [facility] independent living facility.  She was discharged from the hospital earlier 09/24/22 following admission for hypokalemia secondary to severe diarrhea.    PT Comments   Pt admitted with above diagnosis.  Pt currently with functional limitations due to the deficits listed below (see PT Problem List). PT arrived and pt agreeable to therapy. Pt indicated she was glad that this therapist found her and able to recall name from prior tx interventions. Pt reports she is just waiting to go to rehab. Pt is unclear of exactly what happened when she returned to the ILF. Pt reported 6/10 B LE pain R > L and tylenol administered buy nursing staff. Pt required increased time and cues for supine to sit with S, pt required min guard and cues for sit to stand from EOB, gait tasks 80 feet with RW and min guard with 2 standing rest breaks due to fatigue and LE pain. Pt returned to bed and all needs in place. PT noted once outside pts room call bell illuminated and pt requested to use bathroom. Pt required S for supine to sit, min guard for sit to stand  and NT assisted pt with gait at RW level to commode and with remaining toileting tasks. Patient will benefit from continued inpatient follow up therapy, <3 hours/day to maximize safety and I in order to transition to next level of care. Pt will benefit from acute skilled PT as pt remains in current setting to increase their independence and safety with mobility to allow discharge.       Assistance Recommended at Discharge Frequent or constant Supervision/Assistance  If plan is discharge home, recommend the following:  Can travel by private vehicle    Assistance with cooking/housework;Assist for  transportation;Direct supervision/assist for medications management;Help with stairs or ramp for entrance;Direct supervision/assist for financial management;A little help with walking and/or transfers;A little help with bathing/dressing/bathroom   No  Equipment Recommendations  None recommended by PT (DME in home setting)    Recommendations for Other Services       Precautions / Restrictions Precautions Precautions: Fall Restrictions Weight Bearing Restrictions: No     Mobility  Bed Mobility Overal bed mobility: Needs Assistance Bed Mobility: Supine to Sit, Sit to Supine     Supine to sit: Supervision, HOB elevated Sit to supine: Supervision   General bed mobility comments: pt requires increased time and cues for bed mobility with use of bed rails for supine <> sit x 2 bouts    Transfers Overall transfer level: Needs assistance Equipment used: Rolling walker (2 wheels) Transfers: Sit to/from Stand Sit to Stand: Min guard           General transfer comment: cues for safety and pt demonstrating good recall for B UE placement for push to stand from elevated EOB    Ambulation/Gait Ambulation/Gait assistance: Min guard Gait Distance (Feet): 80 Feet Assistive device: Rolling walker (2 wheels) Gait Pattern/deviations: Decreased stride length, Wide base of support, Step-to pattern, Shuffle Gait velocity: decreased     General Gait Details: verbal cues for posture and proximity to RW and encouragement with pt requring 2 standing rest breaks   Optometrist  Tilt Bed    Modified Rankin (Stroke Patients Only)       Balance Overall balance assessment: Mild deficits observed, not formally tested, Needs assistance Sitting-balance support: Feet supported Sitting balance-Leahy Scale: Good     Standing balance support: During functional activity, Reliant on assistive device for balance, Bilateral upper extremity  supported Standing balance-Leahy Scale: Poor Standing balance comment: pt is fearful of falling with hx of multiple falls                            Cognition Arousal/Alertness: Awake/alert Behavior During Therapy: WFL for tasks assessed/performed Overall Cognitive Status: History of cognitive impairments - at baseline Area of Impairment: Safety/judgement, Awareness, Problem solving                         Safety/Judgement: Decreased awareness of safety, Decreased awareness of deficits     General Comments: HOH and requires increased volume, pt appears to be at baseline cogntion, able to recall this therapist by name from prior interventions. pt is requesting to transition to SNF for rehab. pt is LTC resident at [facility] in [facility], pt is unable to recall name of facility.        Exercises      General Comments        Pertinent Vitals/Pain Pain Assessment Pain Assessment: 0-10 Pain Score: 6  Pain Location: BLE R > L Pain Descriptors / Indicators: Guarding, Constant Pain Intervention(s): Limited activity within patient's tolerance, Monitored during session, Patient requesting pain meds-RN notified, RN gave pain meds during session    Home Living                          Prior Function            PT Goals (current goals can now be found in the care plan section) Acute Rehab PT Goals Patient Stated Goal: reahb PT Goal Formulation: With patient Time For Goal Achievement: 10/09/22 Potential to Achieve Goals: Fair Progress towards PT goals: Progressing toward goals    Frequency    Min 1X/week      PT Plan Current plan remains appropriate    Co-evaluation              AM-PAC PT "6 Clicks" Mobility   Outcome Measure  Help needed turning from your back to your side while in a flat bed without using bedrails?: A Little Help needed moving from lying on your back to sitting on the side of a flat bed without using bedrails?:  A Little Help needed moving to and from a bed to a chair (including a wheelchair)?: A Little Help needed standing up from a chair using your arms (e.g., wheelchair or bedside chair)?: A Little Help needed to walk in hospital room?: A Little Help needed climbing 3-5 steps with a railing? : A Lot 6 Click Score: 17    End of Session Equipment Utilized During Treatment: Gait belt Activity Tolerance: Patient tolerated treatment well Patient left: Other (comment) (with NT amb to bathroom) Nurse Communication: Mobility status PT Visit Diagnosis: Muscle weakness (generalized) (M62.81)     Time: 0102-7253 PT Time Calculation (min) (ACUTE ONLY): 38 min  Charges:    $Gait Training: 8-22 mins $Therapeutic Activity: 23-37 mins PT General Charges $$ ACUTE PT VISIT: 1 Visit  [name], PT Acute Rehab    [name] 09/29/2022, 3:01 PM

## 2022-09-30 NOTE — Progress Notes (Signed)
This CSW contacted [employer] and informed that approval of [employer] needed to be corrected to [employer]. [employer] appeals rep connected this CSW with [employer] Environmental education officer) and [employer] requested that a copy of the approval be faxed. CSW faxed over requested information. [employer] will review and update in their system. SNF and HCPOA aware. Pt d/c is anticipated for tomorrow to [facility].

## 2022-09-30 NOTE — ED Notes (Signed)
Patient calm and cooperative.

## 2022-09-30 NOTE — Progress Notes (Signed)
CSW has reached out to Mallory at this time there was N/A This CSW has left a V/M. TOC will continue to follow up.

## 2022-09-30 NOTE — Progress Notes (Addendum)
This CSW spoke with April, who is a Chiropodist. April confirmed that Authorized representative form was received and appeal is under review. Deadline for a determination is 10/02/22 at 9:50 AM.   Call reference #: [id number]  Addend @ 12:39 PM Faxed over updated PT notes to [employer] ([email or web address]; phone: [phone])

## 2022-09-30 NOTE — ED Provider Notes (Signed)
Emergency Medicine Observation Re-evaluation Note  Denise Kelly is a 82 y.o. female, seen on rounds today.  Pt initially presented to the ED for complaints of Fatigue Currently, the patient is eating breakfast in no acute distress.  Physical Exam  BP 127/73 (BP Location: Right Arm)   Pulse 77   Temp 97.6 F (36.4 C) (Oral)   Resp 18   Wt 90.7 kg   SpO2 97%   BMI 31.32 kg/m  Physical Exam   ED Course / MDM  EKG:   I have reviewed the labs performed to date as well as medications administered while in observation.  Recent changes in the last 24 hours include none.  Plan  Current plan is for placement.    Lorre Nick, MD 09/30/22 [phone]

## 2022-09-30 NOTE — ED Notes (Signed)
Dinner tray to bedside

## 2022-09-30 NOTE — Progress Notes (Addendum)
This CSW has faxed over documents to [facility] for patient. AT this time the approval was for the wrong facility. This CSW has reached out to POA to inform her that the patient should DC tomorrow towards the afternoon. The AM CSW will follow up with facility. Tammy was informed patient's room # is 109.    Addend@ 6:31 pm   POA was informed about patient's approval. Patient will DC tomorrow to [facility].

## 2022-10-01 NOTE — Progress Notes (Addendum)
This CSW spoke with [facility] representative, [place] who stated that they have received the approval of appeal and are awaiting a nurse to finalize the change (updated facility). [facility] reported they will call and also send a fax at the time of the change.   Addend @ 12:51 PM Contacted [facility] and was told that there is no nurse assigned to the pt's case still. Representative stated she will outreach to a nurse to have them expedite the change so the member can be discharged to SNF.

## 2022-10-01 NOTE — Progress Notes (Addendum)
This CSW has reached out to [facility] [employer] again to see if any updates were available  this CSW spoke to a [place] M has reported that AUTH was updated and at this time the patient may DC to [facility] once [facility] responds about DC time.   Addend@ 5:30   This CSW has called [facility] to inquire about the AUTH documents [facility]  stated that once the patient is at facility they will update patient's new dates in the sytem and fax over. At this time the approval number is [id number]   Tammy from [facility] stated at this time the patient will need to come in AM. CSW did update POA. AM CSW will follow up.  Addend @ 5:39  Tammy has called this CSW back to inform that she would like transport set up for 10 am tomorrow. Also that the room number has changed to 104 same report number. This CSW will let POA know of transport time.

## 2022-10-01 NOTE — ED Provider Notes (Signed)
Emergency Medicine Observation Re-evaluation Note  Denise Kelly is a 82 y.o. female, seen on rounds today.  Pt initially presented to the ED for complaints of Fatigue Currently, the patient is resting.  Physical Exam  BP (!) 141/77 (BP Location: Right Arm)   Pulse (!) 102   Temp 97.8 F (36.6 C) (Oral)   Resp 18   Wt 90.7 kg   SpO2 95%   BMI 31.32 kg/m  Physical Exam   ED Course / MDM  EKG:   I have reviewed the labs performed to date as well as medications administered while in observation.  Recent changes in the last 24 hours include none.  Plan  Current plan is for placement.    Lorre Nick, MD 10/01/22 [phone]

## 2022-10-01 NOTE — Progress Notes (Addendum)
CSW reached out to [facility] in regards to this patient. AT this time [facility] reports that they are still reviewing to see if the hospital will need to start a a New AUTH. This CSW has explained to the representative  that this has been approved and the AUTH was approved for the wrong facility. This CSW will continue to reach out for any new updates.   Addend@ 3:55 pm  CSW spoke with representative  who reached out to [facility] who is handling this patient's case at this time. It was reported to me by the representative  that Bonita Quin has sent an email to the appropriative team and as of now the patients/ [facility] request is still awaiting a response. CSW will continue to follow up through out duration of shift.    Addend@  At this time there is no response or fax for [facility]. CSW did update POA on patient's DC status.

## 2022-10-02 DIAGNOSIS — R5383 Other fatigue: Secondary | ICD-10-CM | POA: Diagnosis not present

## 2022-10-02 DIAGNOSIS — R197 Diarrhea, unspecified: Secondary | ICD-10-CM | POA: Diagnosis not present

## 2022-10-02 DIAGNOSIS — Z131 Encounter for screening for diabetes mellitus: Secondary | ICD-10-CM | POA: Diagnosis not present

## 2022-10-02 DIAGNOSIS — G4489 Other headache syndrome: Secondary | ICD-10-CM | POA: Diagnosis not present

## 2022-10-02 DIAGNOSIS — M6281 Muscle weakness (generalized): Secondary | ICD-10-CM | POA: Diagnosis not present

## 2022-10-02 DIAGNOSIS — I5032 Chronic diastolic (congestive) heart failure: Secondary | ICD-10-CM | POA: Diagnosis not present

## 2022-10-02 DIAGNOSIS — G4700 Insomnia, unspecified: Secondary | ICD-10-CM | POA: Diagnosis not present

## 2022-10-02 DIAGNOSIS — I10 Essential (primary) hypertension: Secondary | ICD-10-CM | POA: Diagnosis not present

## 2022-10-02 DIAGNOSIS — Z86711 Personal history of pulmonary embolism: Secondary | ICD-10-CM | POA: Diagnosis not present

## 2022-10-02 DIAGNOSIS — Z758 Other problems related to medical facilities and other health care: Secondary | ICD-10-CM | POA: Diagnosis not present

## 2022-10-02 DIAGNOSIS — R110 Nausea: Secondary | ICD-10-CM | POA: Diagnosis not present

## 2022-10-02 DIAGNOSIS — I890 Lymphedema, not elsewhere classified: Secondary | ICD-10-CM | POA: Diagnosis not present

## 2022-10-02 DIAGNOSIS — K5900 Constipation, unspecified: Secondary | ICD-10-CM | POA: Diagnosis not present

## 2022-10-02 DIAGNOSIS — Z7401 Bed confinement status: Secondary | ICD-10-CM | POA: Diagnosis not present

## 2022-10-02 DIAGNOSIS — E119 Type 2 diabetes mellitus without complications: Secondary | ICD-10-CM | POA: Diagnosis not present

## 2022-10-02 DIAGNOSIS — F331 Major depressive disorder, recurrent, moderate: Secondary | ICD-10-CM | POA: Diagnosis not present

## 2022-10-02 DIAGNOSIS — F32A Depression, unspecified: Secondary | ICD-10-CM | POA: Diagnosis not present

## 2022-10-02 DIAGNOSIS — K219 Gastro-esophageal reflux disease without esophagitis: Secondary | ICD-10-CM | POA: Diagnosis not present

## 2022-10-02 DIAGNOSIS — L299 Pruritus, unspecified: Secondary | ICD-10-CM | POA: Diagnosis not present

## 2022-10-02 DIAGNOSIS — S8991XA Unspecified injury of right lower leg, initial encounter: Secondary | ICD-10-CM | POA: Diagnosis not present

## 2022-10-02 DIAGNOSIS — W19XXXA Unspecified fall, initial encounter: Secondary | ICD-10-CM | POA: Diagnosis not present

## 2022-10-02 DIAGNOSIS — G629 Polyneuropathy, unspecified: Secondary | ICD-10-CM | POA: Diagnosis not present

## 2022-10-02 DIAGNOSIS — F4322 Adjustment disorder with anxiety: Secondary | ICD-10-CM | POA: Diagnosis not present

## 2022-10-02 DIAGNOSIS — F419 Anxiety disorder, unspecified: Secondary | ICD-10-CM | POA: Diagnosis not present

## 2022-10-02 DIAGNOSIS — R627 Adult failure to thrive: Secondary | ICD-10-CM | POA: Diagnosis not present

## 2022-10-02 DIAGNOSIS — G894 Chronic pain syndrome: Secondary | ICD-10-CM | POA: Diagnosis not present

## 2022-10-02 NOTE — Discharge Instructions (Signed)
Follow-up with your primary care physician.  If you develop fever, new or worsening weakness, chest pain, trouble breathing, or any other new/concerning symptoms then return to the ER or call 911.

## 2022-10-02 NOTE — Progress Notes (Signed)
Pt d/c to [facility] at 10AM. EDP and RN aware. RN provided report info. PTAR to transport. HCPOA notified.

## 2022-10-02 NOTE — ED Notes (Signed)
Pt remains awake trazadone offered and refused at this time.

## 2022-10-02 NOTE — ED Provider Notes (Signed)
Emergency Medicine Observation Re-evaluation Note  Denise Kelly is a 82 y.o. female, seen on rounds today.  Pt initially presented to the ED for complaints of Fatigue Currently, the patient is eating breakfast.  Physical Exam  BP 125/72 (BP Location: Right Arm)   Pulse 90   Temp 97.8 F (36.6 C) (Oral)   Resp 17   Wt 90.7 kg   SpO2 97%   BMI 31.32 kg/m  Physical Exam General: No acute distress, eating breakfast Lungs: Normal effort Psych: Clear sensorium  ED Course / MDM  EKG:   I have reviewed the labs performed to date as well as medications administered while in observation.  No recent changes in the last 24 hours.  Plan  Current plan is for discharge to [facility].  She received Keflex for 5 days and her urine culture did not grow any specific species so I do not think further antibiotics are warranted.  Will discharge.    Pricilla Loveless, MD 10/02/22 [phone]

## 2022-10-02 NOTE — ED Notes (Signed)
Report given to RN

## 2022-10-03 DIAGNOSIS — G894 Chronic pain syndrome: Secondary | ICD-10-CM | POA: Diagnosis not present

## 2022-10-03 DIAGNOSIS — L299 Pruritus, unspecified: Secondary | ICD-10-CM | POA: Diagnosis not present

## 2022-10-03 DIAGNOSIS — Z86711 Personal history of pulmonary embolism: Secondary | ICD-10-CM | POA: Diagnosis not present

## 2022-10-03 DIAGNOSIS — K5900 Constipation, unspecified: Secondary | ICD-10-CM | POA: Diagnosis not present

## 2022-10-03 DIAGNOSIS — R110 Nausea: Secondary | ICD-10-CM | POA: Diagnosis not present

## 2022-10-03 DIAGNOSIS — G629 Polyneuropathy, unspecified: Secondary | ICD-10-CM | POA: Diagnosis not present

## 2022-10-03 DIAGNOSIS — R197 Diarrhea, unspecified: Secondary | ICD-10-CM | POA: Diagnosis not present

## 2022-10-03 DIAGNOSIS — G4700 Insomnia, unspecified: Secondary | ICD-10-CM | POA: Diagnosis not present

## 2022-10-06 DIAGNOSIS — L299 Pruritus, unspecified: Secondary | ICD-10-CM | POA: Diagnosis not present

## 2022-10-06 DIAGNOSIS — F4322 Adjustment disorder with anxiety: Secondary | ICD-10-CM | POA: Diagnosis not present

## 2022-10-06 DIAGNOSIS — Z86711 Personal history of pulmonary embolism: Secondary | ICD-10-CM | POA: Diagnosis not present

## 2022-10-06 DIAGNOSIS — G4700 Insomnia, unspecified: Secondary | ICD-10-CM | POA: Diagnosis not present

## 2022-10-06 DIAGNOSIS — G894 Chronic pain syndrome: Secondary | ICD-10-CM | POA: Diagnosis not present

## 2022-10-06 DIAGNOSIS — R197 Diarrhea, unspecified: Secondary | ICD-10-CM | POA: Diagnosis not present

## 2022-10-06 DIAGNOSIS — R110 Nausea: Secondary | ICD-10-CM | POA: Diagnosis not present

## 2022-10-06 DIAGNOSIS — K5900 Constipation, unspecified: Secondary | ICD-10-CM | POA: Diagnosis not present

## 2022-10-09 ENCOUNTER — Telehealth: Payer: Self-pay | Admitting: *Deleted

## 2022-10-09 NOTE — Telephone Encounter (Signed)
Transition Care Management Unsuccessful Follow-up Telephone Call  Date of discharge and from where:  [facility] 10/02/2022  Attempts:  1st Attempt  Reason for unsuccessful TCM follow-up call:  Left voice message

## 2022-10-10 DIAGNOSIS — R197 Diarrhea, unspecified: Secondary | ICD-10-CM | POA: Diagnosis not present

## 2022-10-10 DIAGNOSIS — Z131 Encounter for screening for diabetes mellitus: Secondary | ICD-10-CM | POA: Diagnosis not present

## 2022-10-10 DIAGNOSIS — Z758 Other problems related to medical facilities and other health care: Secondary | ICD-10-CM | POA: Diagnosis not present

## 2022-10-13 ENCOUNTER — Telehealth: Payer: Self-pay | Admitting: *Deleted

## 2022-10-13 NOTE — Telephone Encounter (Signed)
Transition Care Management Unsuccessful Follow-up Telephone Call  Date of discharge and from where:  [facility]  Attempts: 2nd attempt   Reason for unsuccessful TCM follow-up call:  Left voice message

## 2022-10-14 DIAGNOSIS — G4700 Insomnia, unspecified: Secondary | ICD-10-CM | POA: Diagnosis not present

## 2022-10-24 DIAGNOSIS — G894 Chronic pain syndrome: Secondary | ICD-10-CM | POA: Diagnosis not present

## 2022-10-24 DIAGNOSIS — Z86711 Personal history of pulmonary embolism: Secondary | ICD-10-CM | POA: Diagnosis not present

## 2022-10-24 DIAGNOSIS — R197 Diarrhea, unspecified: Secondary | ICD-10-CM | POA: Diagnosis not present

## 2022-10-24 DIAGNOSIS — R110 Nausea: Secondary | ICD-10-CM | POA: Diagnosis not present

## 2022-10-24 DIAGNOSIS — G4700 Insomnia, unspecified: Secondary | ICD-10-CM | POA: Diagnosis not present

## 2022-10-24 DIAGNOSIS — F419 Anxiety disorder, unspecified: Secondary | ICD-10-CM | POA: Diagnosis not present

## 2022-10-24 DIAGNOSIS — K5900 Constipation, unspecified: Secondary | ICD-10-CM | POA: Diagnosis not present

## 2022-10-24 DIAGNOSIS — G629 Polyneuropathy, unspecified: Secondary | ICD-10-CM | POA: Diagnosis not present

## 2023-02-17 DIAGNOSIS — F330 Major depressive disorder, recurrent, mild: Secondary | ICD-10-CM | POA: Diagnosis not present

## 2023-04-03 ENCOUNTER — Encounter (HOSPITAL_COMMUNITY): Payer: Self-pay | Admitting: Emergency Medicine

## 2023-04-03 ENCOUNTER — Emergency Department (HOSPITAL_COMMUNITY): Payer: Medicare (Managed Care)

## 2023-04-03 ENCOUNTER — Inpatient Hospital Stay (HOSPITAL_COMMUNITY)
Admission: EM | Admit: 2023-04-03 | Discharge: 2023-04-10 | DRG: 602 | Disposition: A | Discharge status: Home Health | Payer: Medicare (Managed Care) | Source: Skilled Nursing Facility | Attending: Internal Medicine | Admitting: Internal Medicine

## 2023-04-03 DIAGNOSIS — R627 Adult failure to thrive: Secondary | ICD-10-CM | POA: Diagnosis present

## 2023-04-03 DIAGNOSIS — K5909 Other constipation: Secondary | ICD-10-CM | POA: Diagnosis present

## 2023-04-03 DIAGNOSIS — L03116 Cellulitis of left lower limb: Secondary | ICD-10-CM | POA: Diagnosis present

## 2023-04-03 DIAGNOSIS — I2782 Chronic pulmonary embolism: Secondary | ICD-10-CM | POA: Diagnosis present

## 2023-04-03 DIAGNOSIS — L03119 Cellulitis of unspecified part of limb: Secondary | ICD-10-CM | POA: Diagnosis not present

## 2023-04-03 DIAGNOSIS — H9193 Unspecified hearing loss, bilateral: Secondary | ICD-10-CM | POA: Diagnosis present

## 2023-04-03 DIAGNOSIS — Z981 Arthrodesis status: Secondary | ICD-10-CM

## 2023-04-03 DIAGNOSIS — I959 Hypotension, unspecified: Secondary | ICD-10-CM | POA: Diagnosis present

## 2023-04-03 DIAGNOSIS — L0390 Cellulitis, unspecified: Secondary | ICD-10-CM | POA: Diagnosis present

## 2023-04-03 DIAGNOSIS — G4700 Insomnia, unspecified: Secondary | ICD-10-CM | POA: Diagnosis present

## 2023-04-03 DIAGNOSIS — Z86711 Personal history of pulmonary embolism: Secondary | ICD-10-CM | POA: Diagnosis not present

## 2023-04-03 DIAGNOSIS — F322 Major depressive disorder, single episode, severe without psychotic features: Secondary | ICD-10-CM | POA: Diagnosis not present

## 2023-04-03 DIAGNOSIS — B9620 Unspecified Escherichia coli [E. coli] as the cause of diseases classified elsewhere: Secondary | ICD-10-CM | POA: Diagnosis not present

## 2023-04-03 DIAGNOSIS — N390 Urinary tract infection, site not specified: Principal | ICD-10-CM

## 2023-04-03 DIAGNOSIS — E786 Lipoprotein deficiency: Secondary | ICD-10-CM | POA: Diagnosis not present

## 2023-04-03 DIAGNOSIS — I110 Hypertensive heart disease with heart failure: Secondary | ICD-10-CM | POA: Diagnosis present

## 2023-04-03 DIAGNOSIS — Z881 Allergy status to other antibiotic agents status: Secondary | ICD-10-CM

## 2023-04-03 DIAGNOSIS — N3000 Acute cystitis without hematuria: Secondary | ICD-10-CM

## 2023-04-03 DIAGNOSIS — Z888 Allergy status to other drugs, medicaments and biological substances status: Secondary | ICD-10-CM

## 2023-04-03 DIAGNOSIS — J9601 Acute respiratory failure with hypoxia: Secondary | ICD-10-CM | POA: Diagnosis not present

## 2023-04-03 DIAGNOSIS — L03115 Cellulitis of right lower limb: Secondary | ICD-10-CM | POA: Diagnosis not present

## 2023-04-03 DIAGNOSIS — R54 Age-related physical debility: Secondary | ICD-10-CM | POA: Diagnosis not present

## 2023-04-03 DIAGNOSIS — Z886 Allergy status to analgesic agent status: Secondary | ICD-10-CM

## 2023-04-03 DIAGNOSIS — Z79899 Other long term (current) drug therapy: Secondary | ICD-10-CM

## 2023-04-03 DIAGNOSIS — G894 Chronic pain syndrome: Secondary | ICD-10-CM | POA: Diagnosis present

## 2023-04-03 DIAGNOSIS — Z87891 Personal history of nicotine dependence: Secondary | ICD-10-CM

## 2023-04-03 DIAGNOSIS — E876 Hypokalemia: Secondary | ICD-10-CM | POA: Diagnosis present

## 2023-04-03 DIAGNOSIS — Z85828 Personal history of other malignant neoplasm of skin: Secondary | ICD-10-CM

## 2023-04-03 DIAGNOSIS — E66811 Obesity, class 1: Secondary | ICD-10-CM | POA: Diagnosis present

## 2023-04-03 DIAGNOSIS — K219 Gastro-esophageal reflux disease without esophagitis: Secondary | ICD-10-CM | POA: Diagnosis present

## 2023-04-03 DIAGNOSIS — L97909 Non-pressure chronic ulcer of unspecified part of unspecified lower leg with unspecified severity: Secondary | ICD-10-CM | POA: Diagnosis not present

## 2023-04-03 DIAGNOSIS — F1020 Alcohol dependence, uncomplicated: Secondary | ICD-10-CM | POA: Diagnosis present

## 2023-04-03 DIAGNOSIS — G2581 Restless legs syndrome: Secondary | ICD-10-CM | POA: Diagnosis not present

## 2023-04-03 DIAGNOSIS — Z96653 Presence of artificial knee joint, bilateral: Secondary | ICD-10-CM | POA: Diagnosis present

## 2023-04-03 DIAGNOSIS — F32A Depression, unspecified: Secondary | ICD-10-CM | POA: Diagnosis present

## 2023-04-03 DIAGNOSIS — Z6834 Body mass index (BMI) 34.0-34.9, adult: Secondary | ICD-10-CM

## 2023-04-03 DIAGNOSIS — F411 Generalized anxiety disorder: Secondary | ICD-10-CM | POA: Diagnosis not present

## 2023-04-03 DIAGNOSIS — I5032 Chronic diastolic (congestive) heart failure: Secondary | ICD-10-CM | POA: Diagnosis present

## 2023-04-03 DIAGNOSIS — Z6838 Body mass index (BMI) 38.0-38.9, adult: Secondary | ICD-10-CM

## 2023-04-03 DIAGNOSIS — I890 Lymphedema, not elsewhere classified: Secondary | ICD-10-CM

## 2023-04-03 DIAGNOSIS — Z8249 Family history of ischemic heart disease and other diseases of the circulatory system: Secondary | ICD-10-CM

## 2023-04-03 DIAGNOSIS — Z7901 Long term (current) use of anticoagulants: Secondary | ICD-10-CM

## 2023-04-03 DIAGNOSIS — Z713 Dietary counseling and surveillance: Secondary | ICD-10-CM

## 2023-04-03 DIAGNOSIS — Z882 Allergy status to sulfonamides status: Secondary | ICD-10-CM

## 2023-04-03 DIAGNOSIS — F5101 Primary insomnia: Secondary | ICD-10-CM | POA: Diagnosis not present

## 2023-04-03 LAB — CBC
HCT: 37.6 % (ref 36.0–46.0)
Hemoglobin: 12.3 g/dL (ref 12.0–15.0)
MCH: 29.2 pg (ref 26.0–34.0)
MCHC: 32.7 g/dL (ref 30.0–36.0)
MCV: 89.3 fL (ref 80.0–100.0)
Platelets: 178 10*3/uL (ref 150–400)
RBC: 4.21 MIL/uL (ref 3.87–5.11)
RDW: 13.6 % (ref 11.5–15.5)
WBC: 6.9 10*3/uL (ref 4.0–10.5)
nRBC: 0 % (ref 0.0–0.2)

## 2023-04-03 LAB — URINALYSIS, ROUTINE W REFLEX MICROSCOPIC
Bilirubin Urine: NEGATIVE
Glucose, UA: NEGATIVE mg/dL
Ketones, ur: NEGATIVE mg/dL
Nitrite: POSITIVE — AB
Protein, ur: NEGATIVE mg/dL
Specific Gravity, Urine: 1.006 (ref 1.005–1.030)
WBC, UA: >50 WBC/hpf (ref 0–5)
pH: 5 (ref 5.0–8.0)

## 2023-04-03 LAB — COMPREHENSIVE METABOLIC PANEL
ALT: 15 U/L (ref 0–44)
AST: 24 U/L (ref 15–41)
Albumin: 3 g/dL — ABNORMAL LOW (ref 3.5–5.0)
Alkaline Phosphatase: 78 U/L (ref 38–126)
Anion gap: 7 (ref 5–15)
BUN: 11 mg/dL (ref 8–23)
CO2: 28 mmol/L (ref 22–32)
Calcium: 8.3 mg/dL — ABNORMAL LOW (ref 8.9–10.3)
Chloride: 103 mmol/L (ref 98–111)
Creatinine, Ser: 0.74 mg/dL (ref 0.44–1.00)
GFR, Estimated: >60 mL/min (ref >60)
Glucose, Bld: 104 mg/dL — ABNORMAL HIGH (ref 70–99)
Potassium: 3 mmol/L — ABNORMAL LOW (ref 3.5–5.1)
Sodium: 138 mmol/L (ref 135–145)
Total Bilirubin: 0.6 mg/dL (ref 0.0–1.2)
Total Protein: 5.8 g/dL — ABNORMAL LOW (ref 6.5–8.1)

## 2023-04-03 LAB — I-STAT CG4 LACTIC ACID, ED: Lactic Acid, Venous: 1.1 mmol/L (ref 0.5–1.9)

## 2023-04-03 LAB — BRAIN NATRIURETIC PEPTIDE: B Natriuretic Peptide: 32.4 pg/mL (ref 0.0–100.0)

## 2023-04-03 MED ORDER — ONDANSETRON HCL 4 MG/2ML IJ SOLN
4.0000 mg | Freq: Four times a day (QID) | INTRAMUSCULAR | Status: DC | PRN
  Administered 2023-04-07: 4 mg via INTRAVENOUS
  Filled 2023-04-03: qty 2

## 2023-04-03 MED ORDER — APIXABAN 5 MG PO TABS: 5.0000 mg | ORAL_TABLET | Freq: Two times a day (BID) | ORAL | Status: DC

## 2023-04-03 MED ORDER — HYDROXYZINE HCL 10 MG PO TABS
10.0000 mg | ORAL_TABLET | Freq: Three times a day (TID) | ORAL | Status: DC | PRN
  Administered 2023-04-04 – 2023-04-09 (×3): 10 mg via ORAL
  Filled 2023-04-03 (×3): qty 1

## 2023-04-03 MED ORDER — APIXABAN 5 MG PO TABS
5.0000 mg | ORAL_TABLET | Freq: Two times a day (BID) | ORAL | Status: DC
  Administered 2023-04-03 – 2023-04-09 (×13): 5 mg via ORAL
  Filled 2023-04-03 (×13): qty 1

## 2023-04-03 MED ORDER — MORPHINE SULFATE (PF) 2 MG/ML IV SOLN
1.0000 mg | INTRAVENOUS | Status: DC | PRN
  Administered 2023-04-03 – 2023-04-04 (×6): 1 mg via INTRAVENOUS
  Filled 2023-04-03 (×6): qty 1

## 2023-04-03 MED ORDER — ACETAMINOPHEN 325 MG PO TABS
650.0000 mg | ORAL_TABLET | Freq: Four times a day (QID) | ORAL | Status: DC | PRN
  Administered 2023-04-05 – 2023-04-06 (×2): 650 mg via ORAL
  Filled 2023-04-03 (×2): qty 2

## 2023-04-03 MED ORDER — SODIUM CHLORIDE 0.9% FLUSH
3.0000 mL | Freq: Two times a day (BID) | INTRAVENOUS | Status: DC
  Administered 2023-04-03 – 2023-04-09 (×13): 3 mL via INTRAVENOUS

## 2023-04-03 MED ORDER — SODIUM CHLORIDE 0.9 % IV SOLN
250.0000 mL | INTRAVENOUS | Status: AC | PRN
Start: 2023-04-03 — End: 2023-04-04
  Administered 2023-04-04: 250 mL via INTRAVENOUS

## 2023-04-03 MED ORDER — SODIUM CHLORIDE 0.9 % IV SOLN
1.0000 g | INTRAVENOUS | Status: AC
  Administered 2023-04-04 – 2023-04-07 (×5): 1 g via INTRAVENOUS
  Filled 2023-04-03 (×5): qty 10

## 2023-04-03 MED ORDER — POTASSIUM CHLORIDE CRYS ER 20 MEQ PO TBCR
40.0000 meq | EXTENDED_RELEASE_TABLET | Freq: Once | ORAL | Status: AC
  Administered 2023-04-03: 40 meq via ORAL
  Filled 2023-04-03: qty 4

## 2023-04-03 MED ORDER — SODIUM CHLORIDE 0.9% FLUSH: 3.0000 mL | INTRAVENOUS | Status: DC | PRN

## 2023-04-03 MED ORDER — VITAMIN B-12 1000 MCG PO TABS
1000.0000 ug | ORAL_TABLET | Freq: Every day | ORAL | Status: DC
  Administered 2023-04-04 – 2023-04-09 (×6): 1000 ug via ORAL
  Filled 2023-04-03 (×6): qty 1

## 2023-04-03 MED ORDER — ACETAMINOPHEN 325 MG PO TABS
650.0000 mg | ORAL_TABLET | Freq: Once | ORAL | Status: AC
  Administered 2023-04-03: 650 mg via ORAL
  Filled 2023-04-03: qty 2

## 2023-04-03 MED ORDER — ONDANSETRON HCL 4 MG PO TABS
4.0000 mg | ORAL_TABLET | Freq: Four times a day (QID) | ORAL | Status: DC | PRN
  Administered 2023-04-08: 4 mg via ORAL
  Filled 2023-04-03: qty 1

## 2023-04-03 MED ORDER — GABAPENTIN 100 MG PO CAPS
100.0000 mg | ORAL_CAPSULE | Freq: Three times a day (TID) | ORAL | Status: DC
  Administered 2023-04-03 – 2023-04-09 (×18): 100 mg via ORAL
  Filled 2023-04-03 (×19): qty 1

## 2023-04-03 MED ORDER — TRAZODONE HCL 100 MG PO TABS
100.0000 mg | ORAL_TABLET | Freq: Every evening | ORAL | Status: DC | PRN
  Administered 2023-04-03 – 2023-04-07 (×5): 100 mg via ORAL
  Filled 2023-04-03 (×5): qty 1

## 2023-04-03 MED ORDER — MORPHINE SULFATE (PF) 2 MG/ML IV SOLN: 1.0000 mg | INTRAVENOUS | Status: DC | PRN

## 2023-04-03 MED ORDER — HYDRALAZINE HCL 20 MG/ML IJ SOLN: 5.0000 mg | Freq: Three times a day (TID) | INTRAMUSCULAR | Status: DC | PRN

## 2023-04-03 MED ORDER — ACETAMINOPHEN 650 MG RE SUPP: 650.0000 mg | Freq: Four times a day (QID) | RECTAL | Status: DC | PRN

## 2023-04-03 MED ORDER — VANCOMYCIN HCL 1250 MG/250ML IV SOLN
1250.0000 mg | INTRAVENOUS | Status: DC
  Administered 2023-04-04: 1250 mg via INTRAVENOUS
  Filled 2023-04-03: qty 250

## 2023-04-03 MED ORDER — VANCOMYCIN HCL 2000 MG/400ML IV SOLN
2000.0000 mg | Freq: Once | INTRAVENOUS | Status: AC
  Administered 2023-04-04: 2000 mg via INTRAVENOUS
  Filled 2023-04-03: qty 400

## 2023-04-03 NOTE — ED Notes (Signed)
Attempted to take patient to bathroom after she requested. She then said she needed to make a phone call and told me to leave. No urine sample collected at this time.

## 2023-04-03 NOTE — ED Notes (Signed)
Patient made aware of the need for a urine sample. 

## 2023-04-03 NOTE — Progress Notes (Signed)
Pharmacy Antibiotic Note  Denise Kelly is a 83 y.o. female admitted on 04/03/2023 with lower extremity cellulitis, acute cystitis.  Pharmacy has been consulted for Vancomycin dosing.  Plan: Vancomycin 2gm IV x 1 followed by Vancomycin 1250 mg IV Q 24 hrs. Goal AUC 400-550. Expected AUC: 513.1  SCr used: 0.8 (rounded up from 0.74) Ceftriaxone per MD Follow renal function F/u culture results & sensitivitiies  Height: 5\' 7"  (170.2 cm) Weight: 99.6 kg (219 lb 8 oz) IBW/kg (Calculated) : 61.6  Temp (24hrs), Avg:98.2 F (36.8 C), Min:97.7 F (36.5 C), Max:98.7 F (37.1 C)  Recent Labs  Lab 04/03/23 1618 04/03/23 2216  WBC 6.9  --   CREATININE 0.74  --   LATICACIDVEN  --  1.1    Estimated Creatinine Clearance: 66.9 mL/min (by C-G formula based on SCr of 0.74 mg/dL).    Allergies  Allergen Reactions   Sulfa Antibiotics Hives, Swelling and Other (See Comments)    Facial/eye swelling    Coreg [Carvedilol] Other (See Comments)    Memory loss   Motrin [Ibuprofen] Palpitations   Toprol Xl [Metoprolol Tartrate] Cough   Doxycycline Hyclate Other (See Comments)    HEARTBURN   Flovent Hfa [Fluticasone] Itching   Statins Other (See Comments)    MENTAL STATUS CHANGE    Antimicrobials this admission: 1/17 Ceftriaxone >>   1/18 Vancomycin >>    Dose adjustments this admission:    Microbiology results: 1/17 BCx:   1/17 UCx:       Thank you for allowing pharmacy to be a part of this patient's care.  Maryellen Pile, PharmD 04/03/2023 11:15 PM

## 2023-04-03 NOTE — ED Triage Notes (Signed)
Pt here from [facility] green  with c/o painful urination for 1 week , alert and oriented times 3

## 2023-04-03 NOTE — ED Provider Notes (Signed)
[place] EMERGENCY DEPARTMENT AT [facility] Provider Note   CSN: [id number] Arrival date & time: 04/03/23  1408     History  Chief Complaint  Patient presents with   Dysuria    Denise Kelly is a 83 y.o. female with past medical history of HTN, seizures, alcohol dependence, chronic pain syndrome, encephalopathy, CHF, BLE cellulitis presents to emergency department via EMS from [facility] for evaluation of painful urination for 1 week. Normally wears depends.   She also has increased pain and leg swelling. She reports that they are swollen at baseline due to lymphedema but worsen over past four or five days.  She has been admitted for BLE cellulitis several times in past.  Denies CP, sob, fever   Dysuria Associated symptoms: no abdominal pain, no fever, no nausea and no vomiting        Home Medications Prior to Admission medications   Medication Sig Start Date End Date Taking? Authorizing Provider  acetaminophen (TYLENOL) 325 MG tablet Take 2 tablets (650 mg total) by mouth 2 (two) times daily. 05/24/22   Shalhoub, Deno Lunger, MD  apixaban (ELIQUIS) 5 MG TABS tablet Take 5 mg by mouth 2 (two) times daily.    [provider]  cyanocobalamin 1000 MCG tablet Take 1 tablet (1,000 mcg total) by mouth daily. 05/03/22   Albertine Grates, MD  gabapentin (NEURONTIN) 100 MG capsule Take 1 capsule (100 mg total) by mouth 3 (three) times daily. 09/23/22   Rhetta Mura, MD  hydrOXYzine (ATARAX) 10 MG tablet Take 1 tablet (10 mg total) by mouth 3 (three) times daily as needed for itching. 09/23/22   Rhetta Mura, MD  melatonin 3 MG TABS tablet Take 1 tablet (3 mg total) by mouth at bedtime. Patient not taking: Reported on 09/12/2022 05/02/22   Albertine Grates, MD  ondansetron (ZOFRAN) 4 MG tablet Take 4 mg by mouth every 8 (eight) hours as needed for nausea or vomiting.    [provider]  sorbitol 70 % SOLN Take 30 mLs by mouth daily as needed for moderate  constipation. 09/23/22   Rhetta Mura, MD  traZODone (DESYREL) 100 MG tablet Take 1 tablet (100 mg total) by mouth at bedtime as needed for sleep. 09/23/22   Rhetta Mura, MD      Allergies    Sulfa antibiotics, Coreg [carvedilol], Motrin [ibuprofen], Toprol xl [metoprolol tartrate], Doxycycline hyclate, Flovent hfa [fluticasone], and Statins    Review of Systems   Review of Systems  Constitutional:  Negative for chills, fatigue and fever.  Respiratory:  Negative for cough, chest tightness, shortness of breath and wheezing.   Cardiovascular:  Negative for chest pain and palpitations.  Gastrointestinal:  Negative for abdominal pain, constipation, diarrhea, nausea and vomiting.  Genitourinary:  Positive for dysuria.  Neurological:  Negative for dizziness, seizures, weakness, light-headedness, numbness and headaches.    Physical Exam Updated Vital Signs BP (!) 154/91   Pulse (!) 113   Temp 97.7 F (36.5 C) (Oral)   Resp 18   SpO2 94%  Physical Exam Vitals and nursing note reviewed.  Constitutional:      General: She is not in acute distress.    Appearance: Normal appearance.  HENT:     Head: Normocephalic and atraumatic.  Eyes:     Conjunctiva/sclera: Conjunctivae normal.  Cardiovascular:     Rate and Rhythm: Normal rate.  Pulmonary:     Effort: Pulmonary effort is normal. No respiratory distress.     Breath sounds:  Normal breath sounds.  Abdominal:     General: Bowel sounds are normal. There is no distension.     Palpations: Abdomen is soft.     Tenderness: There is no abdominal tenderness. There is no guarding.  Musculoskeletal:        General: Swelling and tenderness present.     Right lower leg: Edema present.     Left lower leg: Edema present.     Comments: 2+ pitting edema BLE with overlying warmth, erythema. TTP BLE  Skin:    Capillary Refill: Capillary refill takes less than 2 seconds.     Coloration: Skin is not jaundiced or pale.  Neurological:      Mental Status: She is alert and oriented to person, place, and time. Mental status is at baseline.     ED Results / Procedures / Treatments   Labs (all labs ordered are listed, but only abnormal results are displayed) Labs Reviewed  COMPREHENSIVE METABOLIC PANEL - Abnormal; Notable for the following components:      Result Value   Potassium 3.0 (*)    Glucose, Bld 104 (*)    Calcium 8.3 (*)    Total Protein 5.8 (*)    Albumin 3.0 (*)    All other components within normal limits  URINALYSIS, ROUTINE W REFLEX MICROSCOPIC - Abnormal; Notable for the following components:   Color, Urine AMBER (*)    APPearance HAZY (*)    Hgb urine dipstick SMALL (*)    Nitrite POSITIVE (*)    Leukocytes,Ua MODERATE (*)    Bacteria, UA RARE (*)    All other components within normal limits  CULTURE, BLOOD (ROUTINE X 2)  CULTURE, BLOOD (ROUTINE X 2)  URINE CULTURE  CBC  BRAIN NATRIURETIC PEPTIDE  CBC  COMPREHENSIVE METABOLIC PANEL  I-STAT CG4 LACTIC ACID, ED    EKG None  Radiology No results found.  Procedures Procedures    Medications Ordered in ED Medications  cefTRIAXone (ROCEPHIN) 1 g in sodium chloride 0.9 % 100 mL IVPB (has no administration in time range)  hydrOXYzine (ATARAX) tablet 10 mg (has no administration in time range)  traZODone (DESYREL) tablet 100 mg (has no administration in time range)  cyanocobalamin (VITAMIN B12) tablet 1,000 mcg (has no administration in time range)  gabapentin (NEURONTIN) capsule 100 mg (has no administration in time range)  sodium chloride flush (NS) 0.9 % injection 3 mL (has no administration in time range)  sodium chloride flush (NS) 0.9 % injection 3 mL (has no administration in time range)  0.9 %  sodium chloride infusion (has no administration in time range)  acetaminophen (TYLENOL) tablet 650 mg (has no administration in time range)    Or  acetaminophen (TYLENOL) suppository 650 mg (has no administration in time range)  ondansetron  (ZOFRAN) tablet 4 mg (has no administration in time range)    Or  ondansetron (ZOFRAN) injection 4 mg (has no administration in time range)  hydrALAZINE (APRESOLINE) injection 5 mg (has no administration in time range)  apixaban (ELIQUIS) tablet 5 mg (has no administration in time range)  morphine (PF) 2 MG/ML injection 1 mg (has no administration in time range)  potassium chloride SA (KLOR-CON M) CR tablet 40 mEq (40 mEq Oral Given 04/03/23 2142)  acetaminophen (TYLENOL) tablet 650 mg (650 mg Oral Given 04/03/23 2141)    ED Course/ Medical Decision Making/ A&P  Medical Decision Making Amount and/or Complexity of Data Reviewed Labs: ordered. Radiology: ordered.  Risk OTC drugs. Prescription drug management.   Patient presents to the ED for concern of urinary frequency and pedal edema, this involves an extensive number of treatment options, and is a complaint that carries with it a high risk of complications and morbidity.  The differential diagnosis includes UTI, stone, pyelo, cellulitis, DVT, infection   Co morbidities that complicate the patient evaluation  See HPI   Additional history obtained:  Additional history obtained from EMS, Family, Nursing, Outside Medical Records, and Past Admission   External records from outside source obtained and reviewed including  Triage RN note and EMS report Recent medical admissions for hypokalemia, cellulitis Initially, patient was not forward with information and agitated with questions. She was able to answer them appropriately but would just say she needed "blood tests". Emergency contact is contacted regarding patient history and ED complaint. Mr. Phillips Climes informed EDP of worsening pedal edema and urinary symptoms.   Lab Tests:  I Ordered, and personally interpreted labs.  The pertinent results include:   K+ 3 Creatinine 0.74 No leukocytosis UA + for infection    Medicines ordered and  prescription drug management:  I ordered medication including tylenol and K+  for pain and hypokalemia  Reevaluation of the patient after these medicines showed that the patient stayed the same I have reviewed the patients home medicines and have made adjustments as needed    Consultations Obtained:  I requested consultation with hospitalist,  and discussed lab and imaging findings as well as pertinent plan - Dr. Tereasa Coop accepts patient for admission  Problem List / ED Course:  Cellulitis BLE warm, erythremia, TTP Been admitted for this in the past on 05/20/2022, 04/28/2022, 04/20/2022 UTI Rocephin and vanc provided Lactic acid and cultures pending Admitted to hospitalist   Reevaluation:  After the interventions noted above, I reevaluated the patient and found that they have :stayed the same    Dispostion:  After consideration of the diagnostic results and the patients response to treatment, I feel that the patent would benefit from admission  Discussed patient, ED workup with Dr. Freida Busman who agrees with plan Final Clinical Impression(s) / ED Diagnoses Final diagnoses:  Urinary tract infection without hematuria, site unspecified  Cellulitis of lower extremity, unspecified laterality    Rx / DC Orders ED Discharge Orders     None         Judithann Sheen, PA 04/03/23 2227    Lorre Nick, MD 04/03/23 2330

## 2023-04-03 NOTE — H&P (Signed)
History and Physical    Denise Kelly JYN:[id number] DOB: 1940/03/31 DOA: 04/03/2023  PCP: Pcp, No   Patient coming from: ALF   Chief Complaint:  Chief Complaint  Patient presents with   Dysuria   ED TRIAGE note:  Pt here from [facility] green  with c/o painful urination for 1 week , alert and oriented times 3      HPI:  Denise Kelly is a 83 y.o. female with medical history significant of chronic diastolic heart failure preserved EF 60 to 65%, essential hypertension, restless leg syndrome, adult failure to thrive, insomnia, pulmonary embolism, chronic depression, chronic lymphedema, chronic constipation, chronic pain syndrome, and GERD presented to emergency department via EMS from [facility] for evaluation for dysuria with urination urination for 1 week. Patient is also complaining about increasing pain and swelling of the bilateral lower extremities.  Patient reported that she has swelling at the baseline due to lymphedema but has been getting worse for last 4 to 5 days.  She denies any fever and chill.  Patient denies any flank pain, increased urinary urgency and frequency.   ED Course:  At presentation to ED patient found tachycardic heart rate 113 and borderline hypotensive. Blood cultures x 2 are in process.  Pending BNP.  Pending lactic acid. UA amber color, hazy appearance, dipstick hemoglobin positive, nitrate positive, leukocyte esterase positive and rare bacteria. CBC unremarkable. CMP showing low potassium 3 and low albumin 3 otherwise unremarkable.  Patient is tachycardic in the ED blood cultures has been obtained and lactic acid has been ordered in the ED.  Hospitalist has been contacted for further evaluation management of acute cystitis and cellulitis of the bilateral lower extremities.   Significant labs in the ED: Lab Orders         Blood culture (routine x 2)         Urine Culture (for pregnant, neutropenic or urologic patients or patients with an  indwelling urinary catheter)         CBC         Comprehensive metabolic panel         Urinalysis, Routine w reflex microscopic -Urine, Clean Catch         Brain natriuretic peptide         CBC         Comprehensive metabolic panel         I-Stat CG4 Lactic Acid       Review of Systems:  Review of Systems  Constitutional:  Negative for chills, fever and malaise/fatigue.  Respiratory:  Negative for shortness of breath.   Cardiovascular:  Negative for chest pain and palpitations.  Gastrointestinal:  Negative for abdominal pain, heartburn and nausea.  Genitourinary:  Positive for dysuria. Negative for flank pain, frequency, hematuria and urgency.  Musculoskeletal:  Negative for falls and joint pain.       Bilateral lower extremities pain  Neurological:  Negative for dizziness and headaches.  Endo/Heme/Allergies:  Does not bruise/bleed easily.  Psychiatric/Behavioral:  The patient is not nervous/anxious.   All other systems reviewed and are negative.   Past Medical History:  Diagnosis Date   Alcohol abuse    ETOH and xanax   Anxiety    Panic attacks    Arthritis    Cancer (HCC)    skin- basal , squamous , melonoma- right hand   Cellulitis    right leg   Complication of anesthesia 2009   "felt drunk for a week after"- AVM-  both times- felt drunk   Depression    Encephalopathy    GERD (gastroesophageal reflux disease)    HOH (hard of hearing)    HOH (hard of hearing)    Hypertension    not on mediacations   Palpitations    PONV (postoperative nausea and vomiting)    Spondylolisthesis of lumbosacral region    UTI (urinary tract infection)    frequently    Past Surgical History:  Procedure Laterality Date   BIOPSY  01/24/2018   Procedure: BIOPSY;  Surgeon: Kerin Salen, MD;  Location: [facility] ENDOSCOPY;  Service: Gastroenterology;;   BREAST SURGERY Right    2 breast biopsies   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   COLONOSCOPY  2011   polyps    ESOPHAGOGASTRODUODENOSCOPY (EGD) WITH PROPOFOL N/A 01/24/2018   Procedure: ESOPHAGOGASTRODUODENOSCOPY (EGD) WITH PROPOFOL;  Surgeon: Kerin Salen, MD;  Location: [facility] ENDOSCOPY;  Service: Gastroenterology;  Laterality: N/A;   EYE SURGERY Bilateral    cataracts   INTRAMEDULLARY (IM) NAIL INTERTROCHANTERIC Left 11/29/2015   Procedure: LEFT HIP   NAIL;  Surgeon: Sheral Apley, MD;  Location: [facility] OR;  Service: Orthopedics;  Laterality: Left;   JOINT REPLACEMENT Left 09/2009   knee   LUMBAR FUSION  07/17/2016   Lumbar four-five Posterior lumbar interbody fusion (N/A)   TONSILLECTOMY     TOTAL KNEE ARTHROPLASTY Right 01/02/2014   Procedure: TOTAL KNEE ARTHROPLASTY;  Surgeon: Dannielle Huh, MD;  Location: [facility] OR;  Service: Orthopedics;  Laterality: Right;   TUBAL LIGATION       reports that she quit smoking about 31 years ago. She started smoking about 51 years ago. She has a 20 pack-year smoking history. She has never used smokeless tobacco. She reports that she does not drink alcohol and does not use drugs.  Allergies  Allergen Reactions   Sulfa Antibiotics Hives, Swelling and Other (See Comments)    Facial/eye swelling    Coreg [Carvedilol] Other (See Comments)    Memory loss   Motrin [Ibuprofen] Palpitations   Toprol Xl [Metoprolol Tartrate] Cough   Doxycycline Hyclate Other (See Comments)    HEARTBURN   Flovent Hfa [Fluticasone] Itching   Statins Other (See Comments)    MENTAL STATUS CHANGE    Family History  Problem Relation Age of Onset   Hypertension Other    Cancer Mother    Cancer Father     Prior to Admission medications   Medication Sig Start Date End Date Taking? Authorizing Provider  acetaminophen (TYLENOL) 325 MG tablet Take 2 tablets (650 mg total) by mouth 2 (two) times daily. 05/24/22   Shalhoub, Deno Lunger, MD  apixaban (ELIQUIS) 5 MG TABS tablet Take 5 mg by mouth 2 (two) times daily.    [provider]  cyanocobalamin 1000 MCG tablet Take 1 tablet (1,000 mcg  total) by mouth daily. 05/03/22   Albertine Grates, MD  gabapentin (NEURONTIN) 100 MG capsule Take 1 capsule (100 mg total) by mouth 3 (three) times daily. 09/23/22   Rhetta Mura, MD  hydrOXYzine (ATARAX) 10 MG tablet Take 1 tablet (10 mg total) by mouth 3 (three) times daily as needed for itching. 09/23/22   Rhetta Mura, MD  melatonin 3 MG TABS tablet Take 1 tablet (3 mg total) by mouth at bedtime. Patient not taking: Reported on 09/12/2022 05/02/22   Albertine Grates, MD  ondansetron (ZOFRAN) 4 MG tablet Take 4 mg by mouth every 8 (eight) hours as needed for nausea or  vomiting.    [provider]  sorbitol 70 % SOLN Take 30 mLs by mouth daily as needed for moderate constipation. 09/23/22   Rhetta Mura, MD  traZODone (DESYREL) 100 MG tablet Take 1 tablet (100 mg total) by mouth at bedtime as needed for sleep. 09/23/22   Rhetta Mura, MD     Physical Exam: Vitals:   04/03/23 1427 04/03/23 1958  BP: 133/80 (!) 154/91  Pulse: 100 (!) 113  Resp: 18 18  Temp: 98.7 F (37.1 C) 97.7 F (36.5 C)  TempSrc: Oral Oral  SpO2: 93% 94%    Physical Exam Constitutional:      Appearance: She is obese. She is not ill-appearing.  HENT:     Mouth/Throat:     Mouth: Mucous membranes are moist.  Cardiovascular:     Rate and Rhythm: Normal rate and regular rhythm.     Pulses: Normal pulses.     Heart sounds: Normal heart sounds.  Pulmonary:     Effort: Pulmonary effort is normal.     Breath sounds: Normal breath sounds.  Abdominal:     Palpations: Abdomen is soft.     Tenderness: There is no right CVA tenderness or left CVA tenderness.  Musculoskeletal:        General: Tenderness present. No signs of injury.     Cervical back: Neck supple.     Right lower leg: Edema present.     Left lower leg: Edema present.     Comments: Bilateral lower extremity erythema, tenderness on palpation and nonpitting edema  Neurological:     Mental Status: She is alert and oriented to person,  place, and time.  Psychiatric:     Comments: Irritable and annoyed mood      Labs on Admission: I have personally reviewed following labs and imaging studies  CBC: Recent Labs  Lab 04/03/23 1618  WBC 6.9  HGB 12.3  HCT 37.6  MCV 89.3  PLT 178   Basic Metabolic Panel: Recent Labs  Lab 04/03/23 1618  NA 138  K 3.0*  CL 103  CO2 28  GLUCOSE 104*  BUN 11  CREATININE 0.74  CALCIUM 8.3*   GFR: CrCl cannot be calculated (Unknown ideal weight.). Liver Function Tests: Recent Labs  Lab 04/03/23 1618  AST 24  ALT 15  ALKPHOS 78  BILITOT 0.6  PROT 5.8*  ALBUMIN 3.0*   No results for input(s): "LIPASE", "AMYLASE" in the last 168 hours. No results for input(s): "AMMONIA" in the last 168 hours. Coagulation Profile: No results for input(s): "INR", "PROTIME" in the last 168 hours. Cardiac Enzymes: No results for input(s): "CKTOTAL", "CKMB", "CKMBINDEX", "TROPONINI", "TROPONINIHS" in the last 168 hours. BNP (last 3 results) Recent Labs    04/05/22 2359 04/28/22 1549 05/20/22 2259  BNP 29.8 32.5 12.2   HbA1C: No results for input(s): "HGBA1C" in the last 72 hours. CBG: No results for input(s): "GLUCAP" in the last 168 hours. Lipid Profile: No results for input(s): "CHOL", "HDL", "LDLCALC", "TRIG", "CHOLHDL", "LDLDIRECT" in the last 72 hours. Thyroid Function Tests: No results for input(s): "TSH", "T4TOTAL", "FREET4", "T3FREE", "THYROIDAB" in the last 72 hours. Anemia Panel: No results for input(s): "VITAMINB12", "FOLATE", "FERRITIN", "TIBC", "IRON", "RETICCTPCT" in the last 72 hours. Urine analysis:    Component Value Date/Time   COLORURINE AMBER (A) 04/03/2023 2045   APPEARANCEUR HAZY (A) 04/03/2023 2045   APPEARANCEUR Clear 09/19/2016 1633   LABSPEC 1.006 04/03/2023 2045   PHURINE 5.0 04/03/2023 2045   GLUCOSEU NEGATIVE  04/03/2023 2045   HGBUR SMALL (A) 04/03/2023 2045   BILIRUBINUR NEGATIVE 04/03/2023 2045   BILIRUBINUR Negative 09/19/2016 1633    KETONESUR NEGATIVE 04/03/2023 2045   PROTEINUR NEGATIVE 04/03/2023 2045   UROBILINOGEN 1.0 11/30/2014 2105   NITRITE POSITIVE (A) 04/03/2023 2045   LEUKOCYTESUR MODERATE (A) 04/03/2023 2045    Radiological Exams on Admission: I have personally reviewed images No results found.   Assessment/Plan: Principal Problem:   Lower extremity cellulitis Active Problems:   Cellulitis of bilateral lower extremities   Acute cystitis   Chronic diastolic CHF (congestive heart failure) (HCC)   Hypokalemia   GERD without esophagitis   RLS (restless legs syndrome)   Chronic depression   Pain syndrome, chronic   Lymphedema   History of pulmonary embolism    Assessment and Plan: Lower extremity cellulitis > Patient has history of bilateral lower extremity lymphedema.  However physical exam showing erythema and tenderness to touch.  Concern for bilateral lower extremity cellulitis. -Patient tachycardic however borderline hypotensive.  No leukocytosis. -Pending lactic acid level. -Blood cultures are pending. -Continue vancomycin with pharmacy consult. - Will follow-up with blood cultures for further antibiotic guidance. -Continue morphine 1 mg every hour as needed for moderate and severe pain.  Continue gabapentin 3 times daily.  Acute cystitis > Patient presenting with complaining of dysuria for 1 week.  Denies any increase in urinary frequency and urgency.  Denies any flank pain. -UA showed evidence of UTI. - Pending urine culture - Per chart review previous urine culture positive for Klebsiella pneumoniae pansensitive. - Plan to continue ceftriaxone 1 g daily.  Hypokalemia -Low potassium 3.  Repleted with oral KCl 40 mEq in the ED.  Grade 1 diastolic heart failure preserved EF 60 to 65% - At home patient not any blood pressure regimen.  In the ED blood pressure found elevated.  In the setting of acute infection holding any blood pressure regimen to start by mouth.  Continue hydralazine as  needed.  GERD -Not currently on any home medication.  Chronic lymphedema bilateral lower extremities Restless leg syndrome Chronic pain syndrome -Continue leg elevation. - Consulted wound care for evaluation for bilateral lower extremity chronic lymphedema associated wound. -Continue gabapentin and morphine as needed.  Insomnia - Continue trazodone 150 mg at bedtime as needed.  History of pulmonary embolism - Continue Eliquis 5 mg twice daily  Generalized anxiety disorder Chronic depression - Continue Atarax 10 mg 3 times daily as needed for anxiety.  Age-related debility - Consulting inpatient PT and OT for evaluation   DVT prophylaxis:  Eliquis Code Status:  Full Code Diet: Heart healthy diet Family Communication: None present Disposition Plan: Tentative discharge to assisted in facility in next 2 to 3 days.  Pending blood cultures and urine culture result. Consults: Wound care, PT and OT Admission status:   Inpatient, Med-Surg  Severity of Illness: The appropriate patient status for this patient is INPATIENT. Inpatient status is judged to be reasonable and necessary in order to provide the required intensity of service to ensure the patient's safety. The patient's presenting symptoms, physical exam findings, and initial radiographic and laboratory data in the context of their chronic comorbidities is felt to place them at high risk for further clinical deterioration. Furthermore, it is not anticipated that the patient will be medically stable for discharge from the hospital within 2 midnights of admission.   * I certify that at the point of admission it is my clinical judgment that the patient will require inpatient hospital  care spanning beyond 2 midnights from the point of admission due to high intensity of service, high risk for further deterioration and high frequency of surveillance required.Marland Kitchen    Tereasa Coop, MD [facility]  How to contact the [facility]  Attending or Consulting provider 7A - 7P or covering provider during after hours 7P -7A, for this patient.  Check the care team in [facility] and look for a) attending/consulting [facility] provider listed and b) the [facility] team listed Log into [email or web address] and use [facility] universal password to access. If you do not have the password, please contact the hospital operator. Locate the [facility] provider you are looking for under [facility] and page to a number that you can be directly reached. If you still have difficulty reaching the provider, please page the [facility] (Director on Call) for the Hospitalists listed on amion for assistance.  04/03/2023, 10:28 PM

## 2023-04-04 ENCOUNTER — Other Ambulatory Visit: Payer: Self-pay

## 2023-04-04 DIAGNOSIS — L97909 Non-pressure chronic ulcer of unspecified part of unspecified lower leg with unspecified severity: Secondary | ICD-10-CM

## 2023-04-04 DIAGNOSIS — G2581 Restless legs syndrome: Secondary | ICD-10-CM

## 2023-04-04 DIAGNOSIS — L03119 Cellulitis of unspecified part of limb: Secondary | ICD-10-CM | POA: Diagnosis not present

## 2023-04-04 LAB — COMPREHENSIVE METABOLIC PANEL
ALT: 14 U/L (ref 0–44)
AST: 25 U/L (ref 15–41)
Albumin: 2.7 g/dL — ABNORMAL LOW (ref 3.5–5.0)
Alkaline Phosphatase: 73 U/L (ref 38–126)
Anion gap: 5 (ref 5–15)
BUN: 9 mg/dL (ref 8–23)
CO2: 29 mmol/L (ref 22–32)
Calcium: 8 mg/dL — ABNORMAL LOW (ref 8.9–10.3)
Chloride: 103 mmol/L (ref 98–111)
Creatinine, Ser: 0.64 mg/dL (ref 0.44–1.00)
GFR, Estimated: >60 mL/min (ref >60)
Glucose, Bld: 147 mg/dL — ABNORMAL HIGH (ref 70–99)
Potassium: 3.9 mmol/L (ref 3.5–5.1)
Sodium: 137 mmol/L (ref 135–145)
Total Bilirubin: 0.9 mg/dL (ref 0.0–1.2)
Total Protein: 5.7 g/dL — ABNORMAL LOW (ref 6.5–8.1)

## 2023-04-04 LAB — CBC
HCT: 38.6 % (ref 36.0–46.0)
Hemoglobin: 12.3 g/dL (ref 12.0–15.0)
MCH: 29.3 pg (ref 26.0–34.0)
MCHC: 31.9 g/dL (ref 30.0–36.0)
MCV: 91.9 fL (ref 80.0–100.0)
Platelets: 172 10*3/uL (ref 150–400)
RBC: 4.2 MIL/uL (ref 3.87–5.11)
RDW: 14 % (ref 11.5–15.5)
WBC: 6.5 10*3/uL (ref 4.0–10.5)
nRBC: 0 % (ref 0.0–0.2)

## 2023-04-04 MED ORDER — ORAL CARE MOUTH RINSE: 15.0000 mL | OROMUCOSAL | Status: DC | PRN

## 2023-04-04 NOTE — Progress Notes (Signed)
PROGRESS NOTE    Denise Kelly  EPP:[id number] DOB: 1941-02-21 DOA: 04/03/2023 PCP: Pcp, No   Brief Narrative:  Denise Kelly is a 83 y.o. female with medical history significant of chronic diastolic heart failure preserved EF 60 to 65%, essential hypertension, restless leg syndrome, adult failure to thrive, insomnia, pulmonary embolism, chronic depression, chronic lymphedema, chronic constipation, chronic pain syndrome, and GERD presented to emergency department via EMS from [facility] for evaluation for dysuria with urination urination for 1 week. Patient is also complaining about increasing pain and swelling of the bilateral lower extremities.  Patient reported that she has swelling at the baseline due to lymphedema but has been getting worse for last 4 to 5 days.     ED Course: At presentation to ED patient found tachycardic heart rate 113 and borderline hypotensive. Blood cultures x 2 are in process.  Pending BNP.  Pending lactic acid. UA amber color, hazy appearance, dipstick hemoglobin positive, nitrate positive, leukocyte esterase positive and rare bacteria. CBC unremarkable. CMP showing low potassium 3 and low albumin 3 otherwise unremarkable.  Blood culture sent. Hospitalist has been contacted for further evaluation management of acute cystitis and cellulitis of the bilateral lower extremities.  Assessment & Plan:   Lower extremity cellulitis -History of bilateral lymphedema.  Presented with worsening bilateral lower extremity redness and pain.  Remained afebrile no leukocytosis.  Lactic acid: WNL blood culture no growth till date -Continue Rocephin and vancomycin.  Continue as needed pain medications.   Acute cystitis -UA concerning for infection.  Urine culture pending. -Per chart review previous urine culture positive for Klebsiella pneumoniae pansensitive. -Continue Rocephin   Hypokalemia -Replenished.   Restless leg syndrome Chronic pain syndrome Chronic bilateral  lower extremity wounds -Continue leg elevation. -Consulted wound care for evaluation for bilateral lower extremity chronic lymphedema associated wound. -Continue gabapentin and morphine as needed.   Insomnia -Continue trazodone 150 mg at bedtime as needed.   History of pulmonary embolism - Continue Eliquis 5 mg twice daily   Generalized anxiety disorder Chronic depression - Continue Atarax 10 mg 3 times daily as needed for anxiety.   Age-related debility -PT/OT  DVT prophylaxis: Eliquis Code Status: Full code Family Communication:  None present at bedside.  Plan of care discussed with patient in length and he verbalized understanding and agreed with it. Disposition Plan: To be determined  Consultants:  None  Procedures:  None  Antimicrobials:  Rocephin Vancomycin  Status is: Inpatient     Subjective: Patient seen and examined.  Sitting comfortably on the bed.  Continues to have bilateral lower extremity swelling and redness and some pain.  Remained afebrile.  No acute events overnight.  Objective: Vitals:   04/03/23 1958 04/03/23 2250 04/04/23 0257 04/04/23 0606  BP: (!) 154/91 130/82 118/67 (!) 106/53  Pulse: (!) 113 (!) 108 (!) 103 (!) 108  Resp: 18 18 18 18   Temp: 97.7 F (36.5 C) 97.6 F (36.4 C)    TempSrc: Oral     SpO2: 94% 92% 94% 94%  Weight:  99.6 kg    Height:  5\' 7"  (1.702 m)      Intake/Output Summary (Last 24 hours) at 04/04/2023 1041 Last data filed at 04/04/2023 6063 Gross per 24 hour  Intake 1548.17 ml  Output 1050 ml  Net 498.17 ml   Filed Weights   04/03/23 2250  Weight: 99.6 kg    Examination:  General exam: Appears calm and comfortable, hard of hearing, on room air Respiratory system:  Clear to auscultation. Respiratory effort normal. Cardiovascular system: S1 & S2 heard, RRR. No JVD, murmurs, rubs, gallops or clicks.  Gastrointestinal system: Abdomen is nondistended, soft and nontender. No organomegaly or masses felt. Normal  bowel sounds heard. Central nervous system: Alert and oriented. No focal neurological deficits. Extremities: Significant bilateral lower extremity swelling, erythema, right lower extremity superficial wound and tenderness positive on palpation. Skin: No rashes, lesions or ulcers Psychiatry: Irritable   Data Reviewed: I have personally reviewed following labs and imaging studies  CBC: Recent Labs  Lab 04/03/23 1618 04/04/23 0832  WBC 6.9 6.5  HGB 12.3 12.3  HCT 37.6 38.6  MCV 89.3 91.9  PLT 178 172   Basic Metabolic Panel: Recent Labs  Lab 04/03/23 1618 04/04/23 0832  NA 138 137  K 3.0* 3.9  CL 103 103  CO2 28 29  GLUCOSE 104* 147*  BUN 11 9  CREATININE 0.74 0.64  CALCIUM 8.3* 8.0*   GFR: Estimated Creatinine Clearance: 66.9 mL/min (by C-G formula based on SCr of 0.64 mg/dL). Liver Function Tests: Recent Labs  Lab 04/03/23 1618 04/04/23 0832  AST 24 25  ALT 15 14  ALKPHOS 78 73  BILITOT 0.6 0.9  PROT 5.8* 5.7*  ALBUMIN 3.0* 2.7*   No results for input(s): "LIPASE", "AMYLASE" in the last 168 hours. No results for input(s): "AMMONIA" in the last 168 hours. Coagulation Profile: No results for input(s): "INR", "PROTIME" in the last 168 hours. Cardiac Enzymes: No results for input(s): "CKTOTAL", "CKMB", "CKMBINDEX", "TROPONINI" in the last 168 hours. BNP (last 3 results) No results for input(s): "PROBNP" in the last 8760 hours. HbA1C: No results for input(s): "HGBA1C" in the last 72 hours. CBG: No results for input(s): "GLUCAP" in the last 168 hours. Lipid Profile: No results for input(s): "CHOL", "HDL", "LDLCALC", "TRIG", "CHOLHDL", "LDLDIRECT" in the last 72 hours. Thyroid Function Tests: No results for input(s): "TSH", "T4TOTAL", "FREET4", "T3FREE", "THYROIDAB" in the last 72 hours. Anemia Panel: No results for input(s): "VITAMINB12", "FOLATE", "FERRITIN", "TIBC", "IRON", "RETICCTPCT" in the last 72 hours. Sepsis Labs: Recent Labs  Lab 04/03/23 2216   LATICACIDVEN 1.1    Recent Results (from the past 240 hours)  Blood culture (routine x 2)     Status: None (Preliminary result)   Collection Time: 04/03/23 10:20 PM   Specimen: Left Antecubital; Blood  Result Value Ref Range Status   Specimen Description   Final    LEFT ANTECUBITAL Performed at [facility], [address], [place], [place] [address]    Special Requests   Final    BOTTLES DRAWN AEROBIC AND ANAEROBIC Blood Culture adequate volume Performed at [facility], [address], [place], [place] [address]    Culture   Final    NO GROWTH < 12 HOURS Performed at [facility], [address], [place], [place] [address]    Report Status PENDING  Incomplete  Blood culture (routine x 2)     Status: None (Preliminary result)   Collection Time: 04/03/23 10:35 PM   Specimen: Right Antecubital; Blood  Result Value Ref Range Status   Specimen Description   Final    RIGHT ANTECUBITAL Performed at [facility], [address] [place], [place] [address]    Special Requests   Final    BOTTLES DRAWN AEROBIC AND ANAEROBIC Blood Culture results may not be optimal due to an inadequate volume of blood received in culture bottles Performed at [facility], [address] [place], [place] [address]    Culture   Final    NO GROWTH < 12 HOURS Performed at [facility], [address] [place], [place] [address]    Report Status PENDING  Incomplete      Radiology Studies: No results found.  Scheduled Meds:  apixaban  5 mg Oral BID   cyanocobalamin  1,000 mcg Oral Daily   gabapentin  100 mg Oral TID   sodium chloride flush  3 mL Intravenous Q12H   Continuous Infusions:  sodium chloride Stopped (04/04/23 0112)   cefTRIAXone (ROCEPHIN)  IV Stopped (04/04/23 0042)   [START ON 04/05/2023] vancomycin       LOS: 1 day   Time spent: 35 minutes   Kassidy Frankson Estill Cotta, MD [facility]  If  7PM-7AM, please contact night-coverage [email or web address] 04/04/2023, 10:41 AM

## 2023-04-04 NOTE — Plan of Care (Signed)
  Problem: Education: Goal: Knowledge of General Education information will improve Description: Including pain rating scale, medication(s)/side effects and non-pharmacologic comfort measures Outcome: Progressing   Problem: Health Behavior/Discharge Planning: Goal: Ability to manage health-related needs will improve Outcome: Progressing   Problem: Activity: Goal: Risk for activity intolerance will decrease Outcome: Progressing   Problem: Coping: Goal: Level of anxiety will decrease Outcome: Progressing   Problem: Elimination: Goal: Will not experience complications related to urinary retention Outcome: Progressing   Problem: Pain Managment: Goal: General experience of comfort will improve and/or be controlled Outcome: Progressing   Problem: Skin Integrity: Goal: Risk for impaired skin integrity will decrease Outcome: Progressing   Problem: Urinary Elimination: Goal: Signs and symptoms of infection will decrease Outcome: Progressing   Problem: Clinical Measurements: Goal: Ability to avoid or minimize complications of infection will improve Outcome: Progressing

## 2023-04-04 NOTE — Evaluation (Signed)
Physical Therapy Evaluation Patient Details Name: Denise Kelly MRN: [id number] DOB: September 08, 1940 Today's Date: 04/04/2023  History of Present Illness  Patient is a 83 year old female who presented with dysuria.patient was admitted with cellulitis and acute cystitis.PMH:  chronic pain syndrome, medical noncompliance, R TKA, L TKA, lumbar fusion, chronic lymphedema,  Clinical Impression  Pt admitted with above diagnosis.  Pt currently with functional limitations due to the deficits listed below (see PT Problem List). Pt will benefit from acute skilled PT to increase their independence and safety with mobility to allow discharge.     The patient required encouragement to participate in mobility. Patient did mobilize with +2 assist, Able to stand  step to recliner, taking extra time. Patient reporting increase in  LE pain.   Patient comes from [facility] with some support  of caregivers and friends. And her cat , [name]. Patient will benefit from HHPT at DC.      If plan is discharge home, recommend the following: A little help with walking and/or transfers;A little help with bathing/dressing/bathroom;Assistance with cooking/housework;Assist for transportation;Help with stairs or ramp for entrance   Can travel by private vehicle        Equipment Recommendations None recommended by PT  Recommendations for Other Services       Functional Status Assessment Patient has had a recent decline in their functional status and demonstrates the ability to make significant improvements in function in a reasonable and predictable amount of time.     Precautions / Restrictions Precautions Precautions: Fall Restrictions Weight Bearing Restrictions Per Provider Order: No      Mobility  Bed Mobility Overal bed mobility: Needs Assistance Bed Mobility: Supine to Sit, Rolling Rolling: Mod assist   Supine to sit: +2 for safety/equipment, +2 for physical assistance, Mod assist     General bed  mobility comments: assist legs to move to bed edge, bed  pad used    Transfers Overall transfer level: Needs assistance Equipment used: Rolling walker (2 wheels) Transfers: Sit to/from Stand, Bed to chair/wheelchair/BSC Sit to Stand: Min assist, +2 physical assistance, +2 safety/equipment, From elevated surface   Step pivot transfers: Min assist, +2 safety/equipment       General transfer comment: able to step to recliner  slow , wide base    Ambulation/Gait                  Stairs            Wheelchair Mobility     Tilt Bed    Modified Rankin (Stroke Patients Only)       Balance Overall balance assessment: Needs assistance Sitting-balance support: Feet supported Sitting balance-Leahy Scale: Fair     Standing balance support: Reliant on assistive device for balance, Bilateral upper extremity supported Standing balance-Leahy Scale: Poor Standing balance comment: fearful of falling                             Pertinent Vitals/Pain Pain Assessment Faces Pain Scale: Hurts even more Pain Location: BLE with movement Pain Descriptors / Indicators: Grimacing, Discomfort Pain Intervention(s): Limited activity within patient's tolerance, Monitored during session, Repositioned    Home Living Family/patient expects to be discharged to:: Assisted living                 Home Equipment: Agricultural consultant (2 wheels) Additional Comments: [facility] lives at [facility] at [employer] level with caregiver support. patient lives with  Psychiatric nurse and has assistance from [place] and Onalee Hua (friends) PRN.    Prior Function Prior Level of Function : Needs assist       Physical Assist : Mobility (physical);ADLs (physical) Mobility (physical): Gait ADLs (physical): Bathing;Dressing;IADLs Mobility Comments: uses walker, reports being very fearful of falling ADLs Comments: supervision.     Extremity/Trunk Assessment   Upper Extremity Assessment Upper Extremity  Assessment: Defer to OT evaluation LUE Deficits / Details: noted to have trigger like finger on 5th digit with tendon tightness visible. tender to touch. reported having surgical intervention on this digit prior.    Lower Extremity Assessment Lower Extremity Assessment: RLE deficits/detail;LLE deficits/detail RLE Deficits / Details: weeping  sores on  lower leg with edema LLE Deficits / Details: same as right    Cervical / Trunk Assessment Cervical / Trunk Assessment: Normal  Communication   Communication Communication: Hearing impairment  Cognition Arousal: Alert Behavior During Therapy: WFL for tasks assessed/performed Overall Cognitive Status: History of cognitive impairments - at baseline                                 General Comments: patient is at baseline at this time. patient recognized this therapist during session from previous hospitalizations, much encouragement to participate initially        General Comments      Exercises     Assessment/Plan    PT Assessment Patient needs continued PT services  PT Problem List Decreased strength;Decreased balance;Decreased range of motion;Decreased mobility;Obesity;Pain;Decreased skin integrity;Decreased activity tolerance       PT Treatment Interventions DME instruction;Therapeutic activities;Gait training;Therapeutic exercise;Patient/family education;Functional mobility training    PT Goals (Current goals can be found in the Care Plan section)  Acute Rehab PT Goals Patient Stated Goal: to go home to [facility] PT Goal Formulation: With patient Time For Goal Achievement: 04/18/23 Potential to Achieve Goals: Fair    Frequency Min 1X/week     Co-evaluation PT/OT/SLP Co-Evaluation/Treatment: Yes Reason for Co-Treatment: To address functional/ADL transfers;Necessary to address cognition/behavior during functional activity PT goals addressed during session: Mobility/safety with mobility OT goals addressed  during session: ADL's and self-care       AM-PAC PT "6 Clicks" Mobility  Outcome Measure Help needed turning from your back to your side while in a flat bed without using bedrails?: A Lot Help needed moving from lying on your back to sitting on the side of a flat bed without using bedrails?: A Lot Help needed moving to and from a bed to a chair (including a wheelchair)?: A Lot Help needed standing up from a chair using your arms (e.g., wheelchair or bedside chair)?: A Lot Help needed to walk in hospital room?: Total Help needed climbing 3-5 steps with a railing? : Total 6 Click Score: 10    End of Session Equipment Utilized During Treatment: Gait belt Activity Tolerance: Patient tolerated treatment well;Patient limited by pain Patient left: in chair;with call bell/phone within reach;with chair alarm set Nurse Communication: Mobility status PT Visit Diagnosis: Unsteadiness on feet (R26.81);Muscle weakness (generalized) (M62.81);Pain Pain - Right/Left: Right Pain - part of body: Leg    Time: 0981-1914 PT Time Calculation (min) (ACUTE ONLY): 46 min   Charges:   PT Evaluation $PT Eval Low Complexity: 1 Low PT Treatments $Therapeutic Activity: 8-22 mins PT General Charges $$ ACUTE PT VISIT: 1 Visit         Blanchard Kelch PT Acute Rehabilitation Services Office  [phone] Weekend [phone]   Rada Hay 04/04/2023, 3:05 PM

## 2023-04-04 NOTE — Evaluation (Signed)
Occupational Therapy Evaluation Patient Details Name: Denise Kelly MRN: [id number] DOB: 1940-06-29 Today's Date: 04/04/2023   History of Present Illness Patient is a 83 year old female who presented with dysuria.patient was admitted with cellulitis and acute cystitis.PMH:  chronic pain syndrome, medical noncompliance, R TKA, L TKA, lumbar fusion, chronic lymphedema,   Clinical Impression   Patient is a 83 year old female who was admitted for above. [facility] living at [facility] with some support from caregivers per patient report. Currently, patient is mod A +2 for getting out of bed with increased pain in BLE. Patient has increased pain and edema in BLE impacting participation with weeping noted with minimal movement. Patient was noted to have decreased functional activity tolerance, decreased endurance, decreased standing balance, decreased safety awareness, and decreased knowledge of AD/AE impacting participation in ADLs. Plan is for patient to transition back to ALF with [facility] services when medically stable. Patient would continue to benefit from skilled OT services at this time while admitted and after d/c to address noted deficits in order to improve overall safety and independence in ADLs.        If plan is discharge home, recommend the following: A lot of help with bathing/dressing/bathroom;A lot of help with walking and/or transfers;Assistance with cooking/housework;Direct supervision/assist for medications management;Assist for transportation;Help with stairs or ramp for entrance;Direct supervision/assist for financial management    Functional Status Assessment  Patient has had a recent decline in their functional status and demonstrates the ability to make significant improvements in function in a reasonable and predictable amount of time.  Equipment Recommendations  None recommended by OT       Precautions / Restrictions Precautions Precautions: Fall Restrictions Weight  Bearing Restrictions Per Provider Order: No      Mobility Bed Mobility Overal bed mobility: Needs Assistance Bed Mobility: Supine to Sit     Supine to sit: +2 for safety/equipment, +2 for physical assistance, Mod assist              Balance Overall balance assessment: Needs assistance Sitting-balance support: Feet supported Sitting balance-Leahy Scale: Fair     Standing balance support: Reliant on assistive device for balance, Bilateral upper extremity supported Standing balance-Leahy Scale: Poor Standing balance comment: fearful of falling         ADL either performed or assessed with clinical judgement   ADL Overall ADL's : Needs assistance/impaired Eating/Feeding: Supervision/ safety;Sitting   Grooming: Sitting;Brushing hair;Minimal assistance Grooming Details (indicate cue type and reason): EOB             Vision Baseline Vision/History: 1 Wears glasses Vision Assessment?: Wears glasses for reading            Pertinent Vitals/Pain Pain Assessment Pain Assessment: Faces Faces Pain Scale: Hurts even more Pain Location: BLE with movement Pain Descriptors / Indicators: Grimacing, Discomfort Pain Intervention(s): Limited activity within patient's tolerance, Monitored during session     Extremity/Trunk Assessment Upper Extremity Assessment Upper Extremity Assessment: Overall WFL for tasks assessed;LUE deficits/detail (noted to be able to lift BUE to about 80 degrees.) LUE Deficits / Details: noted to have trigger like finger on 5th digit with tendon tightness visible. tender to touch. reported having surgical intervention on this digit prior.       Cervical / Trunk Assessment Cervical / Trunk Assessment: Normal   Communication Communication Communication: Hearing impairment   Cognition Arousal: Alert Behavior During Therapy: WFL for tasks assessed/performed Overall Cognitive Status: History of cognitive impairments - at baseline  General Comments: patient is at baseline at this time. patient recognized this therapist during session from previous hospitalizations                Home Living Family/patient expects to be discharged to:: Assisted living         Home Equipment: Rolling Walker (2 wheels)   Additional Comments: [facility] lives at [facility] at [facility] level with caregiver support. patient lives with cat [name] and has assistance from [place] and Onalee Hua (friends) PRN.      Prior Functioning/Environment Prior Level of Function : Needs assist             Mobility Comments: uses walker, reports being very fearful of falling ADLs Comments: supervision.        OT Problem List: Impaired balance (sitting and/or standing);Obesity;Decreased activity tolerance;Increased edema;Pain      OT Treatment/Interventions: Self-care/ADL training;DME and/or AE instruction;Balance training;Therapeutic activities;Patient/family education;Energy conservation    OT Goals(Current goals can be found in the care plan section) Acute Rehab OT Goals Patient Stated Goal: to go home(ALF) OT Goal Formulation: With patient Time For Goal Achievement: 04/18/23 Potential to Achieve Goals: Fair  OT Frequency: Min 1X/week    Co-evaluation PT/OT/SLP Co-Evaluation/Treatment: Yes Reason for Co-Treatment: To address functional/ADL transfers;Necessary to address cognition/behavior during functional activity PT goals addressed during session: Mobility/safety with mobility OT goals addressed during session: ADL's and self-care      AM-PAC OT "6 Clicks" Daily Activity     Outcome Measure Help from another person eating meals?: A Little Help from another person taking care of personal grooming?: A Little Help from another person toileting, which includes using toliet, bedpan, or urinal?: A Lot Help from another person bathing (including washing, rinsing, drying)?: A Lot Help from another person to put on and taking off regular  upper body clothing?: A Little Help from another person to put on and taking off regular lower body clothing?: A Lot 6 Click Score: 15   End of Session Equipment Utilized During Treatment: Gait belt;Rolling walker (2 wheels) Nurse Communication: Mobility status  Activity Tolerance: Patient tolerated treatment well Patient left: in chair;with call bell/phone within reach;with chair alarm set  OT Visit Diagnosis: Unsteadiness on feet (R26.81);Pain;History of falling (Z91.81);Other abnormalities of gait and mobility (R26.89)                Time: 6213-0865 OT Time Calculation (min): 48 min Charges:  OT General Charges $OT Visit: 1 Visit OT Evaluation $OT Eval Moderate Complexity: 1 Mod OT Treatments $Self Care/Home Management : 8-22 mins  Rosalio Loud, MS Acute Rehabilitation Department Office# [phone]   Selinda Flavin 04/04/2023, 12:32 PM

## 2023-04-05 DIAGNOSIS — E786 Lipoprotein deficiency: Secondary | ICD-10-CM

## 2023-04-05 DIAGNOSIS — L03119 Cellulitis of unspecified part of limb: Secondary | ICD-10-CM | POA: Diagnosis not present

## 2023-04-05 LAB — BASIC METABOLIC PANEL
Anion gap: 7 (ref 5–15)
BUN: 9 mg/dL (ref 8–23)
CO2: 25 mmol/L (ref 22–32)
Calcium: 7.7 mg/dL — ABNORMAL LOW (ref 8.9–10.3)
Chloride: 104 mmol/L (ref 98–111)
Creatinine, Ser: 0.73 mg/dL (ref 0.44–1.00)
GFR, Estimated: >60 mL/min (ref >60)
Glucose, Bld: 101 mg/dL — ABNORMAL HIGH (ref 70–99)
Potassium: 4.1 mmol/L (ref 3.5–5.1)
Sodium: 136 mmol/L (ref 135–145)

## 2023-04-05 MED ORDER — MORPHINE SULFATE (PF) 2 MG/ML IV SOLN
1.0000 mg | Freq: Three times a day (TID) | INTRAVENOUS | Status: DC | PRN
  Administered 2023-04-07 – 2023-04-09 (×2): 1 mg via INTRAVENOUS
  Filled 2023-04-05 (×2): qty 1

## 2023-04-05 MED ORDER — OXYCODONE HCL 5 MG PO TABS
5.0000 mg | ORAL_TABLET | Freq: Four times a day (QID) | ORAL | Status: DC | PRN
  Administered 2023-04-05 – 2023-04-10 (×11): 5 mg via ORAL
  Filled 2023-04-05 (×11): qty 1

## 2023-04-05 NOTE — Consult Note (Signed)
WOC Nurse Consult Note: Reason for Consult: LE wounds related to lymphedema and incidentally noted to have intertriginous dermatitis   Wound type: Irritant dermatitis; venous stasis/lymphedema  ICD-10 CM Codes for Irritant Dermatitis L30.4  - Erythema intertrigo. Also used for abrasion of the hand, chafing of the skin, dermatitis due to sweating and friction, friction dermatitis, friction eczema, and genital/thigh intertrigo.  Pressure Injury POA: NA Measurement: see nursing flow sheet for LE wounds Wound bed: see nursing flow sheets Drainage (amount, consistency, odor) weeping from LEs Periwound: intact  Dressing procedure/placement/frequency: Cleanse LE wounds with saline, pat dry.  Cover weeping areas/wounds with single layer of xeroform, top with foam. Wrap legs from toes to knees with kerlix and 4" ACE wraps. Change every other day Use antimicrobial wicking fabric under pannus and breast. Orders updated    Re consult if needed, will not follow at this time. Thanks  Cayle Cordoba [employer], RN,CWOCN, CNS, CWON-AP [phone])

## 2023-04-05 NOTE — Progress Notes (Signed)
PROGRESS NOTE  Denise Kelly  ZOX:[id number] DOB: 03-26-1940 DOA: 04/03/2023 PCP: Pcp, No  Brief Narrative:  Denise Kelly is a 83 y.o. female with medical history significant of chronic diastolic heart failure preserved EF 60 to 65%, essential hypertension, restless leg syndrome, adult failure to thrive, insomnia, pulmonary embolism, chronic depression, chronic lymphedema, chronic constipation, chronic pain syndrome, and GERD presented to emergency department via EMS from [facility] for evaluation for dysuria for 1 week. Patient is also complaining about increasing pain and swelling of the bilateral lower extremities.  Patient reported that she has swelling at the baseline due to lymphedema but has been getting worse for last 4 to 5 days.     ED Course: heart rate 113 and borderline hypotensive. Blood cultures x 2 are in process.  LA 1.1. BNP 32. UA amber color, hazy appearance, dipstick hemoglobin positive, nitrate positive, leukocyte esterase positive and rare bacteria. CBC unremarkable. CMP showing low potassium 3 and low albumin 3 otherwise unremarkable.  Blood culture sent. Hospitalist has been contacted for further evaluation management of acute cystitis and cellulitis of the bilateral lower extremities.  Assessment & Plan:   Bilateral lymphedema  Lower extremity cellulitis- Remained afebrile no leukocytosis.  Lactic acid: WNL blood culture no growth till date -Continue Rocephin Continue as needed pain medications. - PT - unna boots   Acute cystitis-UA concerning for infection.  Urine culture GNR. Endorses symptoms.  -Continue Rocephin   Hypokalemia -Replenished.   Insomnia -Continue trazodone 150 mg at bedtime as needed.   History of pulmonary embolism - Continue Eliquis 5 mg twice daily   Generalized anxiety disorder Chronic depression - Continue Atarax 10 mg 3 times daily as needed for anxiety.   Age-related debility -PT/OT  DVT prophylaxis: Eliquis Code  Status: Full code Family Communication:  None present at bedside.  Plan of care discussed with patient in length and he verbalized understanding and agreed with it. Disposition Plan: To be determined  Consultants:  None  Procedures:  None  Antimicrobials:  Rocephin Vancomycin  Status is: Inpatient   Subjective: Patient reports having significant pain in her legs and pain with urination still.   Objective: Vitals:   04/04/23 1305 04/04/23 1917 04/04/23 2305 04/05/23 0505  BP: 127/67 (!) 123/51 (!) 124/55 103/65  Pulse: (!) 105 (!) 110 (!) 110 (!) 103  Resp: 16 20 20 14   Temp: 99.7 F (37.6 C) 98.3 F (36.8 C)  98.1 F (36.7 C)  TempSrc:      SpO2: 93% (!) 88% 94% 92%  Weight:      Height:        Intake/Output Summary (Last 24 hours) at 04/05/2023 0758 Last data filed at 04/05/2023 0500 Gross per 24 hour  Intake 493 ml  Output 3050 ml  Net -2557 ml   Filed Weights   04/03/23 2250  Weight: 99.6 kg    Examination:  General exam: Appears calm and comfortable, hard of hearing, on room air Respiratory system: Clear to auscultation. Respiratory effort normal. Cardiovascular system: S1 & S2 heard, RRR. No JVD, murmurs, rubs, gallops or clicks.  Gastrointestinal system: Abdomen is nondistended, soft and nontender. No organomegaly or masses felt. Normal bowel sounds heard. Central nervous system: Alert and oriented. No focal neurological deficits. Extremities: Significant bilateral lower extremity swelling, erythema, right lower extremity superficial wound and tenderness positive on palpation. Skin: No rashes, lesions or ulcers Psychiatry: Irritable   Data Reviewed: I have personally reviewed following labs and imaging studies  CBC:  Recent Labs  Lab 04/03/23 1618 04/04/23 0832  WBC 6.9 6.5  HGB 12.3 12.3  HCT 37.6 38.6  MCV 89.3 91.9  PLT 178 172   Basic Metabolic Panel: Recent Labs  Lab 04/03/23 1618 04/04/23 0832 04/05/23 0438  NA 138 137 136  K  3.0* 3.9 4.1  CL 103 103 104  CO2 28 29 25   GLUCOSE 104* 147* 101*  BUN 11 9 9   CREATININE 0.74 0.64 0.73  CALCIUM 8.3* 8.0* 7.7*   GFR: Estimated Creatinine Clearance: 66.9 mL/min (by C-G formula based on SCr of 0.73 mg/dL). Liver Function Tests: Recent Labs  Lab 04/03/23 1618 04/04/23 0832  AST 24 25  ALT 15 14  ALKPHOS 78 73  BILITOT 0.6 0.9  PROT 5.8* 5.7*  ALBUMIN 3.0* 2.7*   Recent Results (from the past 240 hours)  Urine Culture (for pregnant, neutropenic or urologic patients or patients with an indwelling urinary catheter)     Status: Abnormal (Preliminary result)   Collection Time: 04/03/23 10:06 PM   Specimen: Urine, Clean Catch  Result Value Ref Range Status   Specimen Description   Final    URINE, CLEAN CATCH Performed at [facility], [address] [place], [place] [address]    Special Requests   Final    NONE Performed at [facility], [address] [place], [place] [address]    Culture (A)  Final    >=100,000 COLONIES/mL GRAM NEGATIVE RODS CULTURE REINCUBATED FOR BETTER GROWTH Performed at [facility], [address] [place], [place] [address]    Report Status PENDING  Incomplete  Blood culture (routine x 2)     Status: None (Preliminary result)   Collection Time: 04/03/23 10:20 PM   Specimen: Left Antecubital; Blood  Result Value Ref Range Status   Specimen Description   Final    LEFT ANTECUBITAL Performed at [facility], [address], [place], [place] [address]    Special Requests   Final    BOTTLES DRAWN AEROBIC AND ANAEROBIC Blood Culture adequate volume Performed at [facility], [address], [place], [place] [address]    Culture   Final    NO GROWTH < 12 HOURS Performed at [facility], [address], [place], [place] [address]    Report Status PENDING  Incomplete  Blood culture (routine x 2)     Status: None (Preliminary result)    Collection Time: 04/03/23 10:35 PM   Specimen: Right Antecubital; Blood  Result Value Ref Range Status   Specimen Description   Final    RIGHT ANTECUBITAL Performed at [facility], [address] [place], [place] [address]    Special Requests   Final    BOTTLES DRAWN AEROBIC AND ANAEROBIC Blood Culture results may not be optimal due to an inadequate volume of blood received in culture bottles Performed at [facility], [address], [place], [place] [address]    Culture   Final    NO GROWTH < 12 HOURS Performed at [facility], [address], [place], [place] [address]    Report Status PENDING  Incomplete      Radiology Studies: No results found.  Scheduled Meds:  apixaban  5 mg Oral BID   cyanocobalamin  1,000 mcg Oral Daily   gabapentin  100 mg Oral TID   sodium chloride flush  3 mL Intravenous Q12H   Continuous Infusions:  cefTRIAXone (ROCEPHIN)  IV 1 g (04/04/23 2227)   vancomycin 1,250 mg (04/04/23 2309)     LOS: 2 days   Time spent: 35 minutes   Leeroy Bock, MD [facility]  If 7PM-7AM, please contact night-coverage [email or web address] 04/05/2023, 7:58 AM

## 2023-04-05 NOTE — Plan of Care (Signed)
  Problem: Education: Goal: Knowledge of General Education information will improve Description: Including pain rating scale, medication(s)/side effects and non-pharmacologic comfort measures Outcome: Progressing   Problem: Health Behavior/Discharge Planning: Goal: Ability to manage health-related needs will improve Outcome: Progressing   Problem: Clinical Measurements: Goal: Ability to maintain clinical measurements within normal limits will improve Outcome: Progressing Goal: Will remain free from infection Outcome: Progressing Goal: Diagnostic test results will improve Outcome: Progressing Goal: Respiratory complications will improve Outcome: Progressing Goal: Cardiovascular complication will be avoided Outcome: Progressing   Problem: Activity: Goal: Risk for activity intolerance will decrease Outcome: Progressing   Problem: Nutrition: Goal: Adequate nutrition will be maintained Outcome: Progressing   Problem: Coping: Goal: Level of anxiety will decrease Outcome: Progressing   Problem: Elimination: Goal: Will not experience complications related to bowel motility Outcome: Progressing Goal: Will not experience complications related to urinary retention Outcome: Progressing   Problem: Pain Managment: Goal: General experience of comfort will improve and/or be controlled Outcome: Progressing   Problem: Safety: Goal: Ability to remain free from injury will improve Outcome: Progressing   Problem: Skin Integrity: Goal: Risk for impaired skin integrity will decrease Outcome: Progressing   Problem: Urinary Elimination: Goal: Signs and symptoms of infection will decrease Outcome: Progressing   Problem: Clinical Measurements: Goal: Ability to avoid or minimize complications of infection will improve Outcome: Progressing   Problem: Skin Integrity: Goal: Skin integrity will improve Outcome: Progressing

## 2023-04-05 NOTE — Progress Notes (Addendum)
Pt calm sitting in chair, no acute distress noted   pt has been medicated per order. Pt has been semi cooperative through shift  no distress noted at this time  safety measures remain in place  handoff report completed at bedside with jaritou Rn

## 2023-04-06 DIAGNOSIS — N3000 Acute cystitis without hematuria: Secondary | ICD-10-CM | POA: Diagnosis not present

## 2023-04-06 DIAGNOSIS — L03119 Cellulitis of unspecified part of limb: Secondary | ICD-10-CM | POA: Diagnosis not present

## 2023-04-06 DIAGNOSIS — F5101 Primary insomnia: Secondary | ICD-10-CM

## 2023-04-06 DIAGNOSIS — F322 Major depressive disorder, single episode, severe without psychotic features: Secondary | ICD-10-CM

## 2023-04-06 DIAGNOSIS — R54 Age-related physical debility: Secondary | ICD-10-CM

## 2023-04-06 DIAGNOSIS — F411 Generalized anxiety disorder: Secondary | ICD-10-CM

## 2023-04-06 LAB — URINE CULTURE: Culture: >=100000 — AB

## 2023-04-06 MED ORDER — PRAMIPEXOLE DIHYDROCHLORIDE 1 MG PO TABS
1.0000 mg | ORAL_TABLET | Freq: Every day | ORAL | Status: DC
Start: 2023-04-06 — End: 2023-04-10
  Administered 2023-04-06 – 2023-04-09 (×4): 1 mg via ORAL
  Filled 2023-04-06 (×4): qty 1

## 2023-04-06 NOTE — Progress Notes (Signed)
PROGRESS NOTE  CHARLISSE Kelly  IRS:[id number] DOB: 06-01-1940 DOA: 04/03/2023 PCP: Pcp, No  Brief Narrative:  Denise Kelly is a 83 y.o. female with medical history significant of chronic diastolic heart failure preserved EF 60 to 65%, HTN, restless leg syndrome, adult failure to thrive, insomnia, h/o PE, chronic depression, chronic lymphedema, chronic constipation, chronic pain syndrome, and GERD presented to emergency department via EMS from [facility] for evaluation for dysuria for 1 week. Patient is also complaining about increasing pain and swelling of the bilateral lower extremities.  Patient reported that she has swelling at the baseline due to lymphedema but has been getting worse for last 4 to 5 days.     ED Course: heart rate 113 and borderline hypotensive. Blood cultures x 2 are in process.  LA 1.1. BNP 32. UA amber color, hazy appearance, dipstick hemoglobin positive, nitrate positive, leukocyte esterase positive and rare bacteria. CBC unremarkable. CMP showing low potassium 3 and low albumin 3 otherwise unremarkable.  Blood culture sent.  Admitted for management of acute cystitis and cellulitis of the bilateral lower extremities. She remains overall stable and clinically unchanged from presentation, per patient report.   Assessment & Plan:   Bilateral lymphedema  Lower extremity cellulitis- Remained afebrile no leukocytosis.  Lactic acid: WNL. blood culture no growth to date -Continue Rocephin  - Continue as needed pain medications. - PT - unna boots if tolerated   Acute cystitis-UA concerning for infection.  Urine culture e coli. Pansensitive. She states that she can't tell if she's symptomatic because she has a purewhick in place. I assume this means she does not have dysuria at this time.  -Continue Rocephin   Hypokalemia -Replenished.   Insomnia -Continue trazodone 150 mg at bedtime as needed.   History of pulmonary embolism - Continue Eliquis 5 mg twice daily    Generalized anxiety disorder Chronic depression RLS - Continue Atarax 10 mg 3 times daily as needed for anxiety. - continue pramipexole   Age-related debility -PT/OT  DVT prophylaxis: Eliquis Code Status: Full code Family Communication:  None present at bedside.  Plan of care discussed with patient in length and he verbalized understanding and agreed with it. Disposition Plan: To be determined  Consultants:  None  Procedures:  None  Antimicrobials:  Rocephin Vancomycin  Status is: Inpatient   Subjective: Patient reports having significant pain in her legs unchanged from presentation. She denies urinary symptoms due to the fact that she has an external urinary catheter. I explained that this would not eliminate her feeling of dysuria if present but she continues to insist that the purewhick makes it so that she doesn't have dysuria.   Objective: Vitals:   04/05/23 1410 04/05/23 1815 04/05/23 2017 04/06/23 0557  BP:  124/68 122/65 (!) 98/57  Pulse:  100 93 95  Resp:  18 14 20   Temp:  98.7 F (37.1 C) 97.6 F (36.4 C) (!) 97.5 F (36.4 C)  TempSrc:   Oral Oral  SpO2: 94% 95% 93% 91%  Weight:      Height:        Intake/Output Summary (Last 24 hours) at 04/06/2023 0746 Last data filed at 04/06/2023 0557 Gross per 24 hour  Intake --  Output 1050 ml  Net -1050 ml   Filed Weights   04/03/23 2250  Weight: 99.6 kg    Examination:  General exam: Appears calm and comfortable, hard of hearing, on room air Respiratory system: Clear to auscultation. Respiratory effort normal. Cardiovascular system:  S1 & S2 heard, RRR. No JVD, murmurs, rubs, gallops or clicks.  Gastrointestinal system: Abdomen is nondistended, soft and nontender. No organomegaly or masses felt. Normal bowel sounds heard. Central nervous system: Alert and oriented. No focal neurological deficits. Extremities: Significant bilateral lower extremity swelling, erythema, right lower extremity superficial  wound and tenderness positive on palpation. Swelling is slightly improved from yesterday but stil considerable edema and erythema bilaterally.  Skin: No rashes, lesions or ulcers Psychiatry: Irritable   Data Reviewed: I have personally reviewed following labs and imaging studies  CBC: Recent Labs  Lab 04/03/23 1618 04/04/23 0832  WBC 6.9 6.5  HGB 12.3 12.3  HCT 37.6 38.6  MCV 89.3 91.9  PLT 178 172   Basic Metabolic Panel: Recent Labs  Lab 04/03/23 1618 04/04/23 0832 04/05/23 0438  NA 138 137 136  K 3.0* 3.9 4.1  CL 103 103 104  CO2 28 29 25   GLUCOSE 104* 147* 101*  BUN 11 9 9   CREATININE 0.74 0.64 0.73  CALCIUM 8.3* 8.0* 7.7*   GFR: Estimated Creatinine Clearance: 66.9 mL/min (by C-G formula based on SCr of 0.73 mg/dL). Liver Function Tests: Recent Labs  Lab 04/03/23 1618 04/04/23 0832  AST 24 25  ALT 15 14  ALKPHOS 78 73  BILITOT 0.6 0.9  PROT 5.8* 5.7*  ALBUMIN 3.0* 2.7*   Recent Results (from the past 240 hours)  Urine Culture (for pregnant, neutropenic or urologic patients or patients with an indwelling urinary catheter)     Status: Abnormal   Collection Time: 04/03/23 10:06 PM   Specimen: Urine, Clean Catch  Result Value Ref Range Status   Specimen Description   Final    URINE, CLEAN CATCH Performed at [facility], [address], [place], [place] [address]    Special Requests   Final    NONE Performed at [facility], [address] [place], [place] [address]    Culture >=100,000 COLONIES/mL ESCHERICHIA COLI (A)  Final   Report Status 04/06/2023 FINAL  Final   Organism ID, Bacteria ESCHERICHIA COLI (A)  Final      Susceptibility   Escherichia coli - MIC*    AMPICILLIN 16 INTERMEDIATE Intermediate     CEFAZOLIN <=4 SENSITIVE Sensitive     CEFEPIME <=0.12 SENSITIVE Sensitive     CEFTRIAXONE <=0.25 SENSITIVE Sensitive     CIPROFLOXACIN <=0.25 SENSITIVE Sensitive     GENTAMICIN <=1 SENSITIVE Sensitive      IMIPENEM <=0.25 SENSITIVE Sensitive     NITROFURANTOIN <=16 SENSITIVE Sensitive     TRIMETH/SULFA <=20 SENSITIVE Sensitive     AMPICILLIN/SULBACTAM 4 SENSITIVE Sensitive     PIP/TAZO <=4 SENSITIVE Sensitive ug/mL    * >=100,000 COLONIES/mL ESCHERICHIA COLI  Blood culture (routine x 2)     Status: None (Preliminary result)   Collection Time: 04/03/23 10:20 PM   Specimen: Left Antecubital; Blood  Result Value Ref Range Status   Specimen Description   Final    LEFT ANTECUBITAL Performed at [facility], [address], [place], [place] [address]    Special Requests   Final    BOTTLES DRAWN AEROBIC AND ANAEROBIC Blood Culture adequate volume Performed at [facility], [address], [place], [place] [address]    Culture   Final    NO GROWTH 2 DAYS Performed at [facility], [address], [place], [place] [address]    Report Status PENDING  Incomplete  Blood culture (routine x  2)     Status: None (Preliminary result)   Collection Time: 04/03/23 10:35 PM   Specimen: Right Antecubital; Blood  Result Value Ref Range Status   Specimen Description   Final    RIGHT ANTECUBITAL Performed at [facility], [address] [place], [place] [address]    Special Requests   Final    BOTTLES DRAWN AEROBIC AND ANAEROBIC Blood Culture results may not be optimal due to an inadequate volume of blood received in culture bottles Performed at [facility], [address] [place], [place] [address]    Culture   Final    NO GROWTH 2 DAYS Performed at [facility], [address] [place], [place] [address]    Report Status PENDING  Incomplete      Radiology Studies: No results found.  Scheduled Meds:  apixaban  5 mg Oral BID   cyanocobalamin  1,000 mcg Oral Daily   gabapentin  100 mg Oral TID   sodium chloride flush  3 mL Intravenous Q12H   Continuous Infusions:  cefTRIAXone (ROCEPHIN)  IV 1 g  (04/05/23 2144)     LOS: 3 days   Time spent: 35 minutes   Leeroy Bock, MD [facility]  If 7PM-7AM, please contact night-coverage [email or web address] 04/06/2023, 7:46 AM

## 2023-04-06 NOTE — Plan of Care (Signed)
  Problem: Education: Goal: Knowledge of General Education information will improve Description: Including pain rating scale, medication(s)/side effects and non-pharmacologic comfort measures Outcome: Progressing   Problem: Health Behavior/Discharge Planning: Goal: Ability to manage health-related needs will improve Outcome: Progressing   Problem: Clinical Measurements: Goal: Ability to maintain clinical measurements within normal limits will improve Outcome: Progressing Goal: Will remain free from infection Outcome: Progressing Goal: Diagnostic test results will improve Outcome: Progressing Goal: Respiratory complications will improve Outcome: Progressing Goal: Cardiovascular complication will be avoided Outcome: Progressing   Problem: Activity: Goal: Risk for activity intolerance will decrease Outcome: Progressing   Problem: Nutrition: Goal: Adequate nutrition will be maintained Outcome: Progressing   Problem: Coping: Goal: Level of anxiety will decrease Outcome: Progressing   Problem: Elimination: Goal: Will not experience complications related to bowel motility Outcome: Progressing Goal: Will not experience complications related to urinary retention Outcome: Progressing   Problem: Pain Managment: Goal: General experience of comfort will improve and/or be controlled Outcome: Progressing   Problem: Safety: Goal: Ability to remain free from injury will improve Outcome: Progressing   Problem: Skin Integrity: Goal: Risk for impaired skin integrity will decrease Outcome: Progressing   Problem: Urinary Elimination: Goal: Signs and symptoms of infection will decrease Outcome: Progressing   Problem: Clinical Measurements: Goal: Ability to avoid or minimize complications of infection will improve Outcome: Progressing   Problem: Skin Integrity: Goal: Skin integrity will improve Outcome: Progressing

## 2023-04-06 NOTE — Progress Notes (Addendum)
1340- Pt was given a bath and under breasts was red and in perineum has redness,  pt refused any interventions    md made aware of this     1430-  completed wound care on this pt,  pt used very colorful language while this nurse, hillary cna, and maleny cna, present in room.   Pt repeatedly said " fuck off" and using middle finger to staff       patient then told PT that she was in 8 pain and not medicated for it,  pain scale was verbalized in front of three staff members as a 3    pt refused therapy  1635-  pt stated she was in pain    this nurse  addressed pain and also her scheduled medication, pt took the roxicodone IR and stated"  what is this shit about the scheduled gabapentin"    this nurse explained the medications again and the pt stated "I dont want that shit"  this nurse complied  and took the medication back to the med room with Richrd Humbles Rn and wasted the gabapentin in  pyxus and stericycle  made md anderson aware  1914-  PT calm resting in bed  no acute distress noted   medicated for pain per order   safety measures in place  handoff report completed at bedside with jariatou Rn

## 2023-04-06 NOTE — TOC Initial Note (Addendum)
Transition of Care [facility]) - Initial/Assessment Note    Patient Details  Name: Denise Kelly MRN: [id number] Date of Birth: 06-18-1940  Transition of Care [facility]) CM/SW Contact:    Otelia Santee, LCSW Phone Number: 04/06/2023, 3:41 PM  Clinical Narrative:                 Pt from [facility]. Spoke with pt who gave permission to speak with [facility] regarding her discharge plans. Spoke with [facility] and confirmed discharge plans.  Pt recommended for [facility] services. As pt is from [facility], [facility] is contracted to provide home health services. HHPT/OT arranged with [facility]. HH orders will need to be placed and faxed to [facility] at [phone] prior to discharge.   Expected Discharge Plan: Home w Home Health Services Barriers to Discharge: No Barriers Identified   Patient Goals and CMS Choice Patient states their goals for this hospitalization and ongoing recovery are:: To return home CMS Medicare.gov Compare Post Acute Care list provided to:: Patient Choice offered to / list presented to : Patient [place] ownership interest in [facility].provided to::  (NA)    Expected Discharge Plan and Services In-house Referral: Clinical Social Work Discharge Planning Services: NA Post Acute Care Choice: Home Health Living arrangements for the past 2 months: Independent Living Facility                 DME Arranged: N/A DME Agency: NA       HH Arranged: PT, OT HH Agency: Other - See comment International aid/development worker) Date HH Agency Contacted: 04/06/23 Time HH Agency Contacted: 1539 Representative spoke with at [facility] Agency: Rene Kocher  Prior Living Arrangements/Services Living arrangements for the past 2 months: Marketing executive Lives with:: Self Patient language and need for interpreter reviewed:: Yes Do you feel safe going back to the place where you live?: Yes      Need for Family Participation in Patient Care: No (Comment) Care giver support system in place?: Yes  (comment) Current home services: DME Criminal Activity/Legal Involvement Pertinent to Current Situation/Hospitalization: No - Comment as needed  Activities of Daily Living   ADL Screening (condition at time of admission) Independently performs ADLs?: No Does the patient have a NEW difficulty with bathing/dressing/toileting/self-feeding that is expected to last >3 days?: No Does the patient have a NEW difficulty with getting in/out of bed, walking, or climbing stairs that is expected to last >3 days?: No Does the patient have a NEW difficulty with communication that is expected to last >3 days?: No Is the patient deaf or have difficulty hearing?: Yes Does the patient have difficulty seeing, even when wearing glasses/contacts?: No Does the patient have difficulty concentrating, remembering, or making decisions?: No  Permission Sought/Granted Permission sought to share information with : Facility Medical sales representative, Family Supports Permission granted to share information with : Yes, Verbal Permission Granted  Share Information with NAME: [name]  Permission granted to share info w AGENCY: [facility]        Emotional Assessment Appearance:: Appears stated age Attitude/Demeanor/Rapport: Guarded Affect (typically observed): Agitated, Irritable Orientation: : Oriented to Self, Oriented to Place, Oriented to  Time, Oriented to Situation Alcohol / Substance Use: Not Applicable Psych Involvement: No (comment)  Admission diagnosis:  Lower extremity cellulitis [L03.119] Cellulitis of lower extremity, unspecified laterality [L03.119] Urinary tract infection without hematuria, site unspecified [N39.0] Patient Active Problem List   Diagnosis Date Noted   Acute cystitis 04/03/2023   Lower extremity cellulitis 04/03/2023  Bilateral lower extremity pain 05/21/2022   FTT (failure to thrive) in adult 05/01/2022   Chronic diastolic CHF (congestive heart failure) (HCC) 04/30/2022    GERD without esophagitis 04/30/2022   Right hip pain 04/30/2022   Generalized weakness 04/28/2022   Cellulitis of bilateral lower extremities 04/20/2022   Lymphedema 04/20/2022   Personal history of thromboembolic disease 60/45/4098   History of pulmonary embolism 04/20/2022   UTI (urinary tract infection) 01/13/2021   Acute metabolic encephalopathy 01/13/2021   Upper GI bleed 01/23/2018   Leukocytosis 01/23/2018   Physical deconditioning 01/23/2018   Acute pulmonary embolus (HCC) 01/04/2018   Acute encephalopathy 09/28/2017   Constipation, slow transit 09/28/2017   Unsteady gait 04/28/2017   Spondylolisthesis 07/17/2016   Lymphedema of both lower extremities 07/17/2016   Pain syndrome, chronic    Chronic depression 05/23/2016   Chronic back pain 02/25/2016   Lesion of liver 02/25/2016   History of fracture of left hip 11/28/2015   Fall 11/28/2015   RLS (restless legs syndrome) 11/28/2015   Hypokalemia 03/14/2014   Confusion 03/14/2014   Recurrent UTI 03/14/2014   Effusion of right knee 02/22/2014   Alcohol dependence (HCC) 02/22/2014   Substance induced mood disorder (HCC) 02/22/2014   Hepatic encephalopathy (HCC)    Altered mental status 02/21/2014   SIRS (systemic inflammatory response syndrome) (HCC) 02/21/2014   S/P total knee arthroplasty 01/02/2014   Essential hypertension 11/10/2013   LAFB (left anterior fascicular block) 11/10/2013   Obesity, unspecified 11/10/2013   PCP:  Pcp, No Pharmacy:   [id number] [place], [place] - [address] [place] [address] Phone: [phone] Fax: [phone]  [place] [facility] [address] [place] [address] Phone: [phone] Fax: [phone]  [facility] [place], [place] - [address] [place] [address] Phone: [phone] Fax: [phone]     Social Drivers of Health (SDOH) Social  History: SDOH Screenings   Food Insecurity: No Food Insecurity (04/04/2023)  Housing: Unknown (04/04/2023)  Transportation Needs: No Transportation Needs (04/04/2023)  Utilities: Not At Risk (04/04/2023)  Depression (PHQ2-9): Low Risk  (01/09/2019)  Social Connections: Patient Declined (04/04/2023)  Tobacco Use: Medium Risk (04/03/2023)   SDOH Interventions:     Readmission Risk Interventions    04/06/2023    3:38 PM 05/02/2022   11:19 AM  Readmission Risk Prevention Plan  Transportation Screening Complete Complete  PCP or Specialist Appt within 3-5 Days Complete   HRI or Home Care Consult Complete   Social Work Consult for Recovery Care Planning/Counseling Complete   Palliative Care Screening Not Applicable   Medication Review Oceanographer) Complete Complete  PCP or Specialist appointment within 3-5 days of discharge  Complete  HRI or Home Care Consult  Complete  SW Recovery Care/Counseling Consult  Complete  Palliative Care Screening  Not Applicable  Skilled Nursing Facility  Complete

## 2023-04-06 NOTE — Progress Notes (Signed)
Pharmacy Antibiotic Note  Denise Kelly is a 83 y.o. female admitted on 04/03/2023 with   lower extremity cellulitis/acute cystitis .  Pharmacy has been consulted for Vanco dosing.  ID: lower extremity cellulitis/acute cystitis - LE wounds related to lymphedema and incidentally noted to have intertriginous dermatitis   Afebrile, WBC WNL, Scr <1  Antimicrobials this admission: 1/17 Ceftriaxone >>   1/18 Vancomycin >>     Microbiology results: 1/17 BCx:  ngtd 1/17 UCx:  >100,000 Ecoli R to amp  Previous cx: 09/12/22 UCx > 100k k.pneumoniae (pan-sens except resistant to ampicillin)  Plan: Vancomycin 2gm IV x1, then vanc 1250 mg IV Q 24 hrs. (AUC: 513.1  SCr used: 0.8-rounded). No change. Ceftriaxone per MD for cystitis    Height: 5\' 7"  (170.2 cm) Weight: 99.6 kg (219 lb 8 oz) IBW/kg (Calculated) : 61.6  Temp (24hrs), Avg:98.1 F (36.7 C), Min:97.5 F (36.4 C), Max:98.7 F (37.1 C)  Recent Labs  Lab 04/03/23 1618 04/03/23 2216 04/04/23 0832 04/05/23 0438  WBC 6.9  --  6.5  --   CREATININE 0.74  --  0.64 0.73  LATICACIDVEN  --  1.1  --   --     Estimated Creatinine Clearance: 66.9 mL/min (by C-G formula based on SCr of 0.73 mg/dL).    Allergies  Allergen Reactions   Sulfa Antibiotics Hives, Swelling and Other (See Comments)    Facial/eye swelling    Coreg [Carvedilol] Other (See Comments)    Memory loss   Motrin [Ibuprofen] Palpitations   Toprol Xl [Metoprolol Tartrate] Cough   Doxycycline Hyclate Other (See Comments)    HEARTBURN   Flovent Hfa [Fluticasone] Itching   Statins Other (See Comments)    MENTAL STATUS CHANGE   Denise Kelly S. Merilynn Finland, PharmD, BCPS Clinical Staff Pharmacist [email or web address]  Pasty Spillers 04/06/2023 8:42 AM

## 2023-04-07 DIAGNOSIS — L03119 Cellulitis of unspecified part of limb: Secondary | ICD-10-CM | POA: Diagnosis not present

## 2023-04-07 MED ORDER — CEFADROXIL 500 MG PO CAPS
500.0000 mg | ORAL_CAPSULE | Freq: Two times a day (BID) | ORAL | Status: DC
  Administered 2023-04-08 – 2023-04-09 (×4): 500 mg via ORAL
  Filled 2023-04-07 (×5): qty 1

## 2023-04-07 MED ORDER — PHENAZOPYRIDINE HCL 100 MG PO TABS
100.0000 mg | ORAL_TABLET | Freq: Three times a day (TID) | ORAL | Status: DC | PRN
  Administered 2023-04-07: 100 mg via ORAL
  Filled 2023-04-07 (×2): qty 1

## 2023-04-07 MED ORDER — CARMEX CLASSIC LIP BALM EX OINT: TOPICAL_OINTMENT | CUTANEOUS | Status: DC | PRN

## 2023-04-07 NOTE — Plan of Care (Signed)
  Problem: Nutrition: Goal: Adequate nutrition will be maintained Outcome: Progressing   Problem: Elimination: Goal: Will not experience complications related to bowel motility Outcome: Progressing   

## 2023-04-07 NOTE — Hospital Course (Addendum)
82 yof w/ Chronic diastolic heart failure preserved EF 60 to 65%, HTN, restless leg syndrome, adult failure to thrive, insomnia, h/o PE, chronic depression, chronic lymphedema, chronic constipation, chronic pain syndrome, and GERD presented to ED from [facility] for evaluation for dysuria x 1 week and also w/ increasing pain and swelling of the bilateral lower extremities from baseline lymphedema x 4-5 days In ON:GEXBM rate 113 and borderline hypotensive. Blood cultures x 2 sent along with urine culture, patient was admitted for acute cystitis and cellulitis of the bilateral lower extremities.  Urine culture growing E. Coli, being managed with IV antibiotics. PT OT following closely recommending home health PT OT on discharge.

## 2023-04-07 NOTE — Progress Notes (Signed)
PROGRESS NOTE Denise Kelly  ZOX:[id number] DOB: 22-Jan-1941 DOA: 04/03/2023 PCP: Pcp, No  Brief Narrative/Hospital Course: 25 yof w/ Chronic diastolic heart failure preserved EF 60 to 65%, HTN, restless leg syndrome, adult failure to thrive, insomnia, h/o PE, chronic depression, chronic lymphedema, chronic constipation, chronic pain syndrome, and GERD presented to ED from [facility] for evaluation for dysuria x 1 week and also w/ increasing pain and swelling of the bilateral lower extremities from baseline lymphedema x 4-5 days In WJ:XBJYN rate 113 and borderline hypotensive. Blood cultures x 2 sent along with urine culture, patient was admitted for acute cystitis and cellulitis of the bilateral lower extremities.  Urine culture growing E. Coli, being managed with IV antibiotics. PT OT following closely recommending home health PT OT on discharge.     Subjective: Complains of pain in her lower extremities Also having dysuria No nausea vomiting fever chills Overnight afebrile and BP stable   Assessment and Plan: Principal Problem:   Lower extremity cellulitis Active Problems:   Cellulitis of bilateral lower extremities   Acute cystitis   Chronic diastolic CHF (congestive heart failure) (HCC)   Hypokalemia   GERD without esophagitis   RLS (restless legs syndrome)   Chronic depression   Pain syndrome, chronic   Lymphedema   History of pulmonary embolism   Bilateral lymphedema with lower extremity cellulitis: Patient has chronic lymphedema with worsening swelling and cellulitis, fortunately lactic acid normal blood culture no growth.  Continue with ceftriaxone as below along with pain medication "as tolerated PT OT  Acute E. coli cystitis: Continue ceftriaxone's on discharge will switch to Keflex.  Adding Pyridium for ongoing dysuria  Hypokalemia: Resolved  Insomnia: Continue trazodone bedtime  PE history: Continue home Eliquis  GAD Chronic depression RLS: Continue  Atarax 10 mg 3 times daily as needed, home pramipexole and supportive care  Debility deconditioning Age-related frailty: PT recommending home health.  Obesity w/ Body mass index is 34.38 kg/m.: Will benefit with PCP follow-up, weight loss  healthy lifestyle and outpatient sleep evaluation.   DVT prophylaxis: Place TED hose Start: 04/03/23 2218 Code Status:   Code Status: Full Code Family Communication: plan of care discussed with patient at bedside. Patient status is: Remains hospitalized because of severity of illness Level of care: Med-Surg   Dispo: The patient is from: home            Anticipated disposition: Home with home  1-2 days  Objective: Vitals last 24 hrs: Vitals:   04/06/23 1209 04/06/23 1220 04/06/23 2141 04/07/23 0505  BP: 115/71  (!) 125/58 128/66  Pulse: 97 96 (!) 104 93  Resp: 20 19 18 18   Temp: 97.7 F (36.5 C) 97.6 F (36.4 C) 98.2 F (36.8 C) 98.4 F (36.9 C)  TempSrc:    Oral  SpO2: (!) 88% 93% 91% 92%  Weight:      Height:       Weight change:   Physical Examination: General exam: alert awake,at baseline, older than stated age HEENT:Oral mucosa moist, Ear/Nose WNL grossly Respiratory system: Bilaterally clear BS,no use of accessory muscle Cardiovascular system: S1 & S2 +, No JVD. Gastrointestinal system: Abdomen soft,NT,ND, BS+ Nervous System: Alert, awake, moving all extremities,and following commands. Extremities: LE edema present with Ace wrap with swelling and tenderness.  Skin: No rashes,no icterus. MSK: Normal muscle bulk,tone, power   Medications reviewed:  Scheduled Meds:  apixaban  5 mg Oral BID   cyanocobalamin  1,000 mcg Oral Daily   gabapentin  100  mg Oral TID   pramipexole  1 mg Oral QHS   sodium chloride flush  3 mL Intravenous Q12H  Continuous Infusions:  cefTRIAXone (ROCEPHIN)  IV 1 g (04/06/23 2136)   Diet Order             Diet Heart Room service appropriate? Yes; Fluid consistency: Thin  Diet effective now                   Intake/Output Summary (Last 24 hours) at 04/07/2023 1007 Last data filed at 04/07/2023 0821 Gross per 24 hour  Intake 580 ml  Output 3500 ml  Net -2920 ml   Net IO Since Admission: -5,788.83 mL [04/07/23 1007]  Wt Readings from Last 3 Encounters:  04/03/23 99.6 kg  09/26/22 90.7 kg  09/11/22 90.7 kg    Unresulted Labs (From admission, onward)    None     Data Reviewed: I have personally reviewed following labs and imaging studies CBC: Recent Labs  Lab 04/03/23 1618 04/04/23 0832  WBC 6.9 6.5  HGB 12.3 12.3  HCT 37.6 38.6  MCV 89.3 91.9  PLT 178 172  Basic Metabolic Panel:  Recent Labs  Lab 04/03/23 1618 04/04/23 0832 04/05/23 0438  NA 138 137 136  K 3.0* 3.9 4.1  CL 103 103 104  CO2 28 29 25   GLUCOSE 104* 147* 101*  BUN 11 9 9   CREATININE 0.74 0.64 0.73  CALCIUM 8.3* 8.0* 7.7*   GFR: Estimated Creatinine Clearance: 66.9 mL/min (by C-G formula based on SCr of 0.73 mg/dL). Liver Function Tests:  Recent Labs  Lab 04/03/23 1618 04/04/23 0832  AST 24 25  ALT 15 14  ALKPHOS 78 73  BILITOT 0.6 0.9  PROT 5.8* 5.7*  ALBUMIN 3.0* 2.7*   Recent Labs  Lab 04/03/23 2216  LATICACIDVEN 1.1   Recent Results (from the past 240 hours)  Urine Culture (for pregnant, neutropenic or urologic patients or patients with an indwelling urinary catheter)     Status: Abnormal   Collection Time: 04/03/23 10:06 PM   Specimen: Urine, Clean Catch  Result Value Ref Range Status   Specimen Description   Final    URINE, CLEAN CATCH Performed at [facility], [address], [place], [place] [address]    Special Requests   Final    NONE Performed at [facility], [address] [place], [place] [address]    Culture >=100,000 COLONIES/mL ESCHERICHIA COLI (A)  Final   Report Status 04/06/2023 FINAL  Final   Organism ID, Bacteria ESCHERICHIA COLI (A)  Final      Susceptibility   Escherichia coli - MIC*    AMPICILLIN 16  INTERMEDIATE Intermediate     CEFAZOLIN <=4 SENSITIVE Sensitive     CEFEPIME <=0.12 SENSITIVE Sensitive     CEFTRIAXONE <=0.25 SENSITIVE Sensitive     CIPROFLOXACIN <=0.25 SENSITIVE Sensitive     GENTAMICIN <=1 SENSITIVE Sensitive     IMIPENEM <=0.25 SENSITIVE Sensitive     NITROFURANTOIN <=16 SENSITIVE Sensitive     TRIMETH/SULFA <=20 SENSITIVE Sensitive     AMPICILLIN/SULBACTAM 4 SENSITIVE Sensitive     PIP/TAZO <=4 SENSITIVE Sensitive ug/mL    * >=100,000 COLONIES/mL ESCHERICHIA COLI  Blood culture (routine x 2)     Status: None (Preliminary result)   Collection Time: 04/03/23 10:20 PM   Specimen: Left Antecubital; Blood  Result Value Ref Range Status   Specimen Description   Final    LEFT ANTECUBITAL Performed at  [facility], [address], [place], [place] [address]    Special Requests   Final    BOTTLES DRAWN AEROBIC AND ANAEROBIC Blood Culture adequate volume Performed at [facility], [address] [place], [place] [address]    Culture   Final    NO GROWTH 3 DAYS Performed at [facility], [address] [place], [place] [address]    Report Status PENDING  Incomplete  Blood culture (routine x 2)     Status: None (Preliminary result)   Collection Time: 04/03/23 10:35 PM   Specimen: Right Antecubital; Blood  Result Value Ref Range Status   Specimen Description   Final    RIGHT ANTECUBITAL Performed at [facility], [address], [place], [place] [address]    Special Requests   Final    BOTTLES DRAWN AEROBIC AND ANAEROBIC Blood Culture results may not be optimal due to an inadequate volume of blood received in culture bottles Performed at [facility], [address], [place], [place] [address]    Culture   Final    NO GROWTH 3 DAYS Performed at [facility], [address] [place], [place] [address]    Report Status PENDING  Incomplete     Antimicrobials/Microbiology: Anti-infectives (From admission, onward)    Start     Dose/Rate Route Frequency Ordered Stop   04/05/23 0000  vancomycin (VANCOREADY) IVPB 1250 mg/250 mL  Status:  Discontinued        1,250 mg 166.7 mL/hr over 90 Minutes Intravenous Every 24 hours 04/03/23 2321 04/05/23 0759   04/04/23 0015  vancomycin (VANCOREADY) IVPB 2000 mg/400 mL        2,000 mg 200 mL/hr over 120 Minutes Intravenous  Once 04/03/23 2315 04/04/23 0312   04/03/23 2230  cefTRIAXone (ROCEPHIN) 1 g in sodium chloride 0.9 % 100 mL IVPB        1 g 200 mL/hr over 30 Minutes Intravenous Every 24 hours 04/03/23 2204 04/10/23 2229         Component Value Date/Time   SDES  04/03/2023 2235    RIGHT ANTECUBITAL Performed at [facility], [address], [place], [place] [address]    SPECREQUEST  04/03/2023 2235    BOTTLES DRAWN AEROBIC AND ANAEROBIC Blood Culture results may not be optimal due to an inadequate volume of blood received in culture bottles Performed at [facility], [address] [place], [place] [address]    CULT  04/03/2023 2235    NO GROWTH 3 DAYS Performed at [facility], [address], [place], [place] [address]    REPTSTATUS PENDING 04/03/2023 2235     Radiology Studies: No results found.   LOS: 4 days   Total time spent in review of labs and imaging, patient evaluation, formulation of plan, documentation and communication with family: 35 minutes  Lanae Boast, MD  [facility]  04/07/2023, 10:07 AM

## 2023-04-07 NOTE — Progress Notes (Signed)
PT Cancellation Note  Patient Details Name: Denise Kelly MRN: [id number] DOB: [birth year]   Cancelled Treatment:    Reason Eval/Treat Not Completed: Patient declined, no reason specified Patient said she was not getting up and asked therapists to leave.  Blanchard Kelch PT Acute Rehabilitation Services Office [phone] Weekend pager-[phone]   Rada Hay 04/07/2023, 1:51 PM

## 2023-04-07 NOTE — Plan of Care (Signed)
  Problem: Education: Goal: Knowledge of General Education information will improve Description: Including pain rating scale, medication(s)/side effects and non-pharmacologic comfort measures Outcome: Progressing   Problem: Health Behavior/Discharge Planning: Goal: Ability to manage health-related needs will improve Outcome: Progressing   Problem: Clinical Measurements: Goal: Ability to maintain clinical measurements within normal limits will improve Outcome: Progressing Goal: Will remain free from infection Outcome: Progressing Goal: Diagnostic test results will improve Outcome: Progressing Goal: Respiratory complications will improve Outcome: Progressing Goal: Cardiovascular complication will be avoided Outcome: Progressing   Problem: Activity: Goal: Risk for activity intolerance will decrease Outcome: Progressing   Problem: Nutrition: Goal: Adequate nutrition will be maintained Outcome: Progressing   Problem: Coping: Goal: Level of anxiety will decrease Outcome: Progressing   Problem: Elimination: Goal: Will not experience complications related to bowel motility Outcome: Progressing Goal: Will not experience complications related to urinary retention Outcome: Progressing   Problem: Pain Managment: Goal: General experience of comfort will improve and/or be controlled Outcome: Progressing   Problem: Safety: Goal: Ability to remain free from injury will improve Outcome: Progressing   Problem: Skin Integrity: Goal: Risk for impaired skin integrity will decrease Outcome: Progressing   Problem: Urinary Elimination: Goal: Signs and symptoms of infection will decrease Outcome: Progressing   Problem: Clinical Measurements: Goal: Ability to avoid or minimize complications of infection will improve Outcome: Progressing   Problem: Skin Integrity: Goal: Skin integrity will improve Outcome: Progressing

## 2023-04-07 NOTE — Progress Notes (Signed)
OT Cancellation Note  Patient Details Name: KERRIN FRANEY MRN: [id number] DOB: 21-Apr-1940   Cancelled Treatment:    Reason Eval/Treat Not Completed: Patient declined, no reason specified Patient was approached earlier with patient eating breakfast. Patient was approached at agreed upon time with patient agitated and upset. Reported throat hurt with voice sounding horace. Patient declining to participate. OT to continue to follow and check back as schedule will allow.  Rosalio Loud, MS Acute Rehabilitation Department Office# [phone]  04/07/2023, 11:38 AM

## 2023-04-08 ENCOUNTER — Inpatient Hospital Stay (HOSPITAL_COMMUNITY): Payer: Medicare (Managed Care)

## 2023-04-08 ENCOUNTER — Other Ambulatory Visit (HOSPITAL_COMMUNITY): Payer: Self-pay

## 2023-04-08 DIAGNOSIS — L03119 Cellulitis of unspecified part of limb: Secondary | ICD-10-CM | POA: Diagnosis not present

## 2023-04-08 LAB — BLOOD GAS, VENOUS
Acid-Base Excess: 8.1 mmol/L — ABNORMAL HIGH (ref 0.0–2.0)
Bicarbonate: 36.1 mmol/L — ABNORMAL HIGH (ref 20.0–28.0)
O2 Saturation: 74.5 %
Patient temperature: 36.4
pCO2, Ven: 59 mm[Hg] (ref 44–60)
pH, Ven: 7.39 (ref 7.25–7.43)
pO2, Ven: 44 mm[Hg] (ref 32–45)

## 2023-04-08 LAB — BASIC METABOLIC PANEL
Anion gap: 8 (ref 5–15)
BUN: 9 mg/dL (ref 8–23)
CO2: 27 mmol/L (ref 22–32)
Calcium: 8.4 mg/dL — ABNORMAL LOW (ref 8.9–10.3)
Chloride: 101 mmol/L (ref 98–111)
Creatinine, Ser: 0.63 mg/dL (ref 0.44–1.00)
GFR, Estimated: >60 mL/min (ref >60)
Glucose, Bld: 121 mg/dL — ABNORMAL HIGH (ref 70–99)
Potassium: 4 mmol/L (ref 3.5–5.1)
Sodium: 136 mmol/L (ref 135–145)

## 2023-04-08 LAB — CBC
HCT: 39.4 % (ref 36.0–46.0)
Hemoglobin: 12.6 g/dL (ref 12.0–15.0)
MCH: 29.3 pg (ref 26.0–34.0)
MCHC: 32 g/dL (ref 30.0–36.0)
MCV: 91.6 fL (ref 80.0–100.0)
Platelets: 177 10*3/uL (ref 150–400)
RBC: 4.3 MIL/uL (ref 3.87–5.11)
RDW: 13.1 % (ref 11.5–15.5)
WBC: 6.1 10*3/uL (ref 4.0–10.5)
nRBC: 0 % (ref 0.0–0.2)

## 2023-04-08 LAB — CULTURE, BLOOD (ROUTINE X 2)
Culture: NO GROWTH
Culture: NO GROWTH
Special Requests: ADEQUATE

## 2023-04-08 LAB — D-DIMER, QUANTITATIVE: D-Dimer, Quant: 1.09 ug{FEU}/mL — ABNORMAL HIGH (ref 0.00–0.50)

## 2023-04-08 LAB — BRAIN NATRIURETIC PEPTIDE: B Natriuretic Peptide: 31.4 pg/mL (ref 0.0–100.0)

## 2023-04-08 MED ORDER — IOHEXOL 350 MG/ML SOLN
75.0000 mL | Freq: Once | INTRAVENOUS | Status: AC | PRN
  Administered 2023-04-08: 75 mL via INTRAVENOUS

## 2023-04-08 MED ORDER — CEFADROXIL 500 MG PO CAPS
500.0000 mg | ORAL_CAPSULE | Freq: Two times a day (BID) | ORAL | 0 refills | Status: AC
  Filled 2023-04-08: qty 8, 4d supply, fill #0

## 2023-04-08 MED ORDER — FUROSEMIDE 10 MG/ML IJ SOLN
40.0000 mg | INTRAMUSCULAR | Status: AC
  Administered 2023-04-08: 40 mg via INTRAVENOUS
  Filled 2023-04-08: qty 4

## 2023-04-08 MED ORDER — APIXABAN 5 MG PO TABS
5.0000 mg | ORAL_TABLET | Freq: Two times a day (BID) | ORAL | 0 refills | Status: AC
  Filled 2023-04-08: qty 60, 30d supply, fill #0

## 2023-04-08 MED ORDER — GABAPENTIN 100 MG PO CAPS: 100.0000 mg | ORAL_CAPSULE | Freq: Three times a day (TID) | ORAL | 0 refills | Status: DC

## 2023-04-08 MED ORDER — PRAMIPEXOLE DIHYDROCHLORIDE 1 MG PO TABS
1.0000 mg | ORAL_TABLET | Freq: Every evening | ORAL | 0 refills | Status: DC
  Filled 2023-04-08: qty 30, 30d supply, fill #0

## 2023-04-08 MED ORDER — TRAZODONE HCL 100 MG PO TABS
100.0000 mg | ORAL_TABLET | Freq: Every evening | ORAL | 0 refills | Status: DC | PRN
  Filled 2023-04-08: qty 30, 30d supply, fill #0

## 2023-04-08 MED ORDER — PHENAZOPYRIDINE HCL 100 MG PO TABS
100.0000 mg | ORAL_TABLET | Freq: Three times a day (TID) | ORAL | 0 refills | Status: AC | PRN
  Filled 2023-04-08: qty 10, 4d supply, fill #0

## 2023-04-08 MED ORDER — ONDANSETRON HCL 4 MG PO TABS
4.0000 mg | ORAL_TABLET | Freq: Three times a day (TID) | ORAL | 0 refills | Status: AC | PRN
  Filled 2023-04-08: qty 30, 10d supply, fill #0

## 2023-04-08 MED ORDER — POLYETHYLENE GLYCOL 3350 17 G PO PACK
17.0000 g | PACK | Freq: Every day | ORAL | Status: DC
  Filled 2023-04-08: qty 1

## 2023-04-08 MED ORDER — CYANOCOBALAMIN 1000 MCG PO TABS: 1000.0000 ug | ORAL_TABLET | Freq: Every day | ORAL | Status: AC

## 2023-04-08 MED ORDER — HYDROXYZINE HCL 10 MG PO TABS
10.0000 mg | ORAL_TABLET | Freq: Three times a day (TID) | ORAL | 0 refills | Status: AC | PRN
  Filled 2023-04-08: qty 30, 10d supply, fill #0

## 2023-04-08 NOTE — TOC Transition Note (Addendum)
Transition of Care [facility]) - Discharge Note   Patient Details  Name: Denise Kelly MRN: [id number] Date of Birth: 08/07/1940  [facility] of [facility]) CM/SW Contact:  Diona Browner, LCSW Phone Number: 04/08/2023, 1:10 PM   Clinical Narrative:    Pt to return to [facility] [address]. HHPT/OT/RN setup with contracted [facility] and orders faxed. Voicemail left for to contact [name] to inform of d/c. Pt to transport using [facility].    Addendum: Pt no longer discharging today. Pt contact [name] updated and will be informed at discharge. [name] may be able to transport pt depending on time of d/c.   Final next level of care: Home w Home Health Services [facility]) Barriers to Discharge: Barriers Resolved   Patient Goals and CMS Choice Patient states their goals for this hospitalization and ongoing recovery are:: Return to baseline CMS Medicare.gov Compare Post Acute Care list provided to:: Patient Choice offered to / list presented to : Patient [place] ownership interest in [facility].provided to::  (N/A)    Discharge Placement                Patient to be transferred to facility by: [facility] Name of family member notified: [name] (Other)  [phone] Patient and family notified of of transfer: 04/08/23  Discharge Plan and Services Additional resources added to the After Visit Summary for   In-house Referral: Clinical Social Work Discharge Planning Services: NA Post Acute Care Choice: Home Health          DME Arranged: N/A DME Agency: NA       HH Arranged: RN, PT, OT HH Agency: Other - See comment International aid/development worker) Date HH Agency Contacted: 04/08/23 Time HH Agency Contacted: 1300 Representative spoke with at [facility] Agency: Rene Kocher  Social Drivers of Health (SDOH) Interventions SDOH Screenings   Food Insecurity: No Food Insecurity (04/04/2023)  Housing: Low Risk  (04/07/2023)  Transportation Needs: No Transportation Needs  (04/04/2023)  Utilities: Not At Risk (04/04/2023)  Depression (PHQ2-9): Low Risk  (01/09/2019)  Social Connections: Patient Declined (04/04/2023)  Tobacco Use: Medium Risk (04/03/2023)     Readmission Risk Interventions    04/08/2023    1:02 PM 04/06/2023    3:38 PM 05/02/2022   11:19 AM  Readmission Risk Prevention Plan  Transportation Screening Complete Complete Complete  PCP or Specialist Appt within 3-5 Days Complete Complete   HRI or Home Care Consult Complete Complete   Social Work Consult for Recovery Care Planning/Counseling Complete Complete   Palliative Care Screening Complete Not Applicable   Medication Review Oceanographer) Complete Complete Complete  PCP or Specialist appointment within 3-5 days of discharge   Complete  HRI or Home Care Consult   Complete  SW Recovery Care/Counseling Consult   Complete  Palliative Care Screening   Not Applicable  Skilled Nursing Facility   Complete

## 2023-04-08 NOTE — Progress Notes (Addendum)
PROGRESS NOTE Denise Kelly  WUJ:[id number] DOB: 1940/09/23 DOA: 04/03/2023 PCP: Pcp, No  Brief Narrative/Hospital Course: 44 yof w/ Chronic diastolic heart failure preserved EF 60 to 65%, HTN, restless leg syndrome, adult failure to thrive, insomnia, h/o PE, chronic depression, chronic lymphedema, chronic constipation, chronic pain syndrome, and GERD presented to ED from [facility] for evaluation for dysuria x 1 week and also w/ increasing pain and swelling of the bilateral lower extremities from baseline lymphedema x 4-5 days In NF:AOZHY rate 113 and borderline hypotensive. Blood cultures x 2 sent along with urine culture, patient was admitted for acute cystitis and cellulitis of the bilateral lower extremities.  Urine culture growing E. Coli, being managed with IV antibiotics. PT OT following closely recommending home health PT OT on discharge.  Overall she is clinically improved leg cellulitis looks much better.  PT OT has cleared for home health PT OT     Subjective: Patient overall feeling much better legs pain and swelling improving Nursing reported that she was hypoxic 84% on room air discharge held On exam she is asymptomatic alert awake comfortable denies any chest pain shortness of breath fever chills [place] on going home but I explained and agreeable to stay   Assessment and Plan: Principal Problem:   Lower extremity cellulitis Active Problems:   Cellulitis of bilateral lower extremities   Acute cystitis   Chronic diastolic CHF (congestive heart failure) (HCC)   Hypokalemia   GERD without esophagitis   RLS (restless legs syndrome)   Chronic depression   Pain syndrome, chronic   Lymphedema   History of pulmonary embolism  Bilateral lymphedema with lower extremity cellulitis:  Patient has chronic lymphedema with worsening swelling and cellulitis, fortunately lactic acid normal blood culture no growth.  Manage with IV antibiotics local wound care at this time clinically  improved, has mild redness > will complete oral antibiotics, continue wound care.  Will plan on home health RN on dc   Acute E. coli cystitis: Continue abx as above  cont prn Pyridium   Hypokalemia: Resolved   Insomnia: Continue trazodone bedtime   PE history: Continue home Eliquis  Acute hypoxic respiratory failure saturating 84% on RA: Appears asymptomatic history of PE but remains on Eliquis.  No chest pain or shortness of breath.  On exam has bilateral basal crackles obtain chest x-ray will do Lasix x 1 IV, BNP, wean oxygen as tolerated, keep on supplemental oxygen doing well on 2 L [place]. ?  If she has underlying OSA-check VBG, d dimer as well. Addendum: cxr fairly ok.bnp normal. Pt feels some better after iv lasix, but still on o2, d dimer up at 1. She is agreeable for CTA give her PE history.  GAD Chronic depression RLS: Continue Atarax 10 mg 3 times daily as needed, home pramipexole and supportive care   Debility deconditioning Age-related frailty: PT recommending home health.   Obesity w/ Body mass index is 34.38 kg/m.: Will benefit with PCP follow-up, weight loss  healthy lifestyle and outpatient sleep evaluation. DVT prophylaxis: Place TED hose Start: 04/03/23 2218 Code Status:   Code Status: Full Code Family Communication: plan of care discussed with patient at bedside. Patient status is: Remains hospitalized because of severity of illness Level of care: Med-Surg   Dispo: The patient is from: home            Anticipated disposition: Home 1-2 DAYS  Objective: Vitals last 24 hrs: Vitals:   04/08/23 0431 04/08/23 1217 04/08/23 1311 04/08/23 1312  BP: 134/63 124/64    Pulse: 91 (!) 101  89  Resp: 15     Temp: 97.8 F (36.6 C) (!) 97.5 F (36.4 C)    TempSrc:  Oral    SpO2: 90% (!) 84% 90% 90%  Weight:      Height:       Weight change:   Physical Examination: General exam: alert awake, oriented at baseline, older than stated age HEENT:Oral mucosa moist,  Ear/Nose WNL grossly Respiratory system: Bilaterally basal crackles,no use of accessory muscle Cardiovascular system: S1 & S2 +, No JVD. Gastrointestinal system: Abdomen soft,NT,ND, BS+ Nervous System: Alert, awake, moving all extremities,and following commands. Extremities: LE edema mild with erythema mild tenderness, chronic appearing Skin: No rashes,no icterus. MSK: Normal muscle bulk,tone, power   Medications reviewed:  Scheduled Meds:  apixaban  5 mg Oral BID   cefadroxil  500 mg Oral BID   cyanocobalamin  1,000 mcg Oral Daily   furosemide  40 mg Intravenous STAT   gabapentin  100 mg Oral TID   pramipexole  1 mg Oral QHS   sodium chloride flush  3 mL Intravenous Q12H  Continuous Infusions:   Diet Order             Diet Heart Room service appropriate? Yes; Fluid consistency: Thin  Diet effective now                  Intake/Output Summary (Last 24 hours) at 04/08/2023 1353 Last data filed at 04/08/2023 0645 Gross per 24 hour  Intake 220 ml  Output 2150 ml  Net -1930 ml   Net IO Since Admission: -7,718.83 mL [04/08/23 1353]  Wt Readings from Last 3 Encounters:  04/03/23 99.6 kg  09/26/22 90.7 kg  09/11/22 90.7 kg    Unresulted Labs (From admission, onward)     Start     Ordered   04/08/23 1332  Brain natriuretic peptide  Add-on,   AD       Question:  Specimen collection method  Answer:  Lab=Lab collect   04/08/23 1331   04/08/23 0500  Basic metabolic panel  Daily,   R     Question:  Specimen collection method  Answer:  Lab=Lab collect   04/07/23 1048   04/08/23 0500  CBC  Daily,   R     Question:  Specimen collection method  Answer:  Lab=Lab collect   04/07/23 1048          Data Reviewed: I have personally reviewed following labs and imaging studies CBC: Recent Labs  Lab 04/03/23 1618 04/04/23 0832 04/08/23 0424  WBC 6.9 6.5 6.1  HGB 12.3 12.3 12.6  HCT 37.6 38.6 39.4  MCV 89.3 91.9 91.6  PLT 178 172 177  Basic Metabolic Panel:  Recent Labs   Lab 04/03/23 1618 04/04/23 0832 04/05/23 0438 04/08/23 0424  NA 138 137 136 136  K 3.0* 3.9 4.1 4.0  CL 103 103 104 101  CO2 28 29 25 27   GLUCOSE 104* 147* 101* 121*  BUN 11 9 9 9   CREATININE 0.74 0.64 0.73 0.63  CALCIUM 8.3* 8.0* 7.7* 8.4*   GFR: Estimated Creatinine Clearance: 66.9 mL/min (by C-G formula based on SCr of 0.63 mg/dL). Liver Function Tests:  Recent Labs  Lab 04/03/23 1618 04/04/23 0832  AST 24 25  ALT 15 14  ALKPHOS 78 73  BILITOT 0.6 0.9  PROT 5.8* 5.7*  ALBUMIN 3.0* 2.7*   Recent Labs  Lab 04/03/23 2216  LATICACIDVEN  1.1   Recent Results (from the past 240 hours)  Urine Culture (for pregnant, neutropenic or urologic patients or patients with an indwelling urinary catheter)     Status: Abnormal   Collection Time: 04/03/23 10:06 PM   Specimen: Urine, Clean Catch  Result Value Ref Range Status   Specimen Description   Final    URINE, CLEAN CATCH Performed at [facility], [address] [place], [place] [address]    Special Requests   Final    NONE Performed at [facility], [address] [place], [place] [address]    Culture >=100,000 COLONIES/mL ESCHERICHIA COLI (A)  Final   Report Status 04/06/2023 FINAL  Final   Organism ID, Bacteria ESCHERICHIA COLI (A)  Final      Susceptibility   Escherichia coli - MIC*    AMPICILLIN 16 INTERMEDIATE Intermediate     CEFAZOLIN <=4 SENSITIVE Sensitive     CEFEPIME <=0.12 SENSITIVE Sensitive     CEFTRIAXONE <=0.25 SENSITIVE Sensitive     CIPROFLOXACIN <=0.25 SENSITIVE Sensitive     GENTAMICIN <=1 SENSITIVE Sensitive     IMIPENEM <=0.25 SENSITIVE Sensitive     NITROFURANTOIN <=16 SENSITIVE Sensitive     TRIMETH/SULFA <=20 SENSITIVE Sensitive     AMPICILLIN/SULBACTAM 4 SENSITIVE Sensitive     PIP/TAZO <=4 SENSITIVE Sensitive ug/mL    * >=100,000 COLONIES/mL ESCHERICHIA COLI  Blood culture (routine x 2)     Status: None   Collection Time: 04/03/23 10:20 PM    Specimen: Left Antecubital; Blood  Result Value Ref Range Status   Specimen Description   Final    LEFT ANTECUBITAL Performed at [facility], [address] [place], [place] [address]    Special Requests   Final    BOTTLES DRAWN AEROBIC AND ANAEROBIC Blood Culture adequate volume Performed at [facility], [address] [place], [place] [address]    Culture   Final    NO GROWTH 5 DAYS Performed at [facility], [address], [place], [place] [address]    Report Status 04/08/2023 FINAL  Final  Blood culture (routine x 2)     Status: None   Collection Time: 04/03/23 10:35 PM   Specimen: Right Antecubital; Blood  Result Value Ref Range Status   Specimen Description   Final    RIGHT ANTECUBITAL Performed at [facility], [address] [place], [place] [address]    Special Requests   Final    BOTTLES DRAWN AEROBIC AND ANAEROBIC Blood Culture results may not be optimal due to an inadequate volume of blood received in culture bottles Performed at [facility], [address], [place], [place] [address]    Culture   Final    NO GROWTH 5 DAYS Performed at [facility], [address] [place], [place] [address]    Report Status 04/08/2023 FINAL  Final    Antimicrobials/Microbiology: Anti-infectives (From admission, onward)    Start     Dose/Rate Route Frequency Ordered Stop   04/08/23 1000  cefadroxil (DURICEF) capsule 500 mg        500 mg Oral 2 times daily 04/07/23 1101 04/11/23 0959   04/08/23 0000  cefadroxil (DURICEF) 500 MG capsule        500 mg Oral 2 times daily 04/08/23 1247 04/12/23 2359   04/05/23 0000  vancomycin (VANCOREADY) IVPB 1250 mg/250 mL  Status:  Discontinued  1,250 mg 166.7 mL/hr over 90 Minutes Intravenous Every 24 hours 04/03/23 2321 04/05/23 0759   04/04/23 0015  vancomycin (VANCOREADY) IVPB 2000 mg/400 mL        2,000 mg 200 mL/hr over 120 Minutes  Intravenous  Once 04/03/23 2315 04/04/23 0312   04/03/23 2230  cefTRIAXone (ROCEPHIN) 1 g in sodium chloride 0.9 % 100 mL IVPB        1 g 200 mL/hr over 30 Minutes Intravenous Every 24 hours 04/03/23 2204 04/07/23 2155         Component Value Date/Time   SDES  04/03/2023 2235    RIGHT ANTECUBITAL Performed at [facility], [address] [place], [place] [address]    SPECREQUEST  04/03/2023 2235    BOTTLES DRAWN AEROBIC AND ANAEROBIC Blood Culture results may not be optimal due to an inadequate volume of blood received in culture bottles Performed at [facility], [address] [place], [place] [address]    CULT  04/03/2023 2235    NO GROWTH 5 DAYS Performed at [facility], [address] [place], [place] [address]    REPTSTATUS 04/08/2023 FINAL 04/03/2023 2235     Radiology Studies: No results found.   LOS: 5 days   Total time spent in review of labs and imaging, patient evaluation, formulation of plan, documentation and communication with family: 35 minutes  Lanae Boast, MD  [facility]  04/08/2023, 1:53 PM

## 2023-04-08 NOTE — Discharge Summary (Signed)
Alert awake oriented, feels betterPhysician Discharge Summary  AIYANNA BAMBACH NFA:[id number] DOB: 05/02/1940 DOA: 04/03/2023  PCP: Pcp, No  Admit date: 04/03/2023 Discharge date: 04/08/2023 Recommendations for Outpatient Follow-up:  Follow up with PCP in 1 weeks-call for appointment Please obtain BMP/CBC in one week  Discharge Dispo: Home w/ [facility] Discharge Condition: Stable Code Status:   Code Status: Full Code Diet recommendation:  Diet Order             Diet Heart Room service appropriate? Yes; Fluid consistency: Thin  Diet effective now                    Brief/Interim Summary: 38 yof w/ Chronic diastolic heart failure preserved EF 60 to 65%, HTN, restless leg syndrome, adult failure to thrive, insomnia, h/o PE, chronic depression, chronic lymphedema, chronic constipation, chronic pain syndrome, and GERD presented to ED from [facility] for evaluation for dysuria x 1 week and also w/ increasing pain and swelling of the bilateral lower extremities from baseline lymphedema x 4-5 days In IO:NGEXB rate 113 and borderline hypotensive. Blood cultures x 2 sent along with urine culture, patient was admitted for acute cystitis and cellulitis of the bilateral lower extremities.  Urine culture growing E. Coli, being managed with IV antibiotics. PT OT following closely recommending home health PT OT on discharge.  Overall she is clinically improved leg cellulitis looks much better.  PT OT has cleared for home health PT OT   Discharge Diagnoses:  Principal Problem:   Lower extremity cellulitis Active Problems:   Cellulitis of bilateral lower extremities   Acute cystitis   Chronic diastolic CHF (congestive heart failure) (HCC)   Hypokalemia   GERD without esophagitis   RLS (restless legs syndrome)   Chronic depression   Pain syndrome, chronic   Lymphedema   History of pulmonary embolism  Bilateral lymphedema with lower extremity cellulitis: Patient has chronic lymphedema with  worsening swelling and cellulitis, fortunately lactic acid normal blood culture no growth.  Manage with IV antibiotics local wound care at this time clinically improved, has mild redness > will complete oral antibiotics at home continue wound care we will set up home health.     Acute E. coli cystitis: Continue abx as above  cont prn Pyridium   Hypokalemia: Resolved   Insomnia: Continue trazodone bedtime   PE history: Continue home Eliquis   GAD Chronic depression RLS: Continue Atarax 10 mg 3 times daily as needed, home pramipexole and supportive care   Debility deconditioning Age-related frailty: PT recommending home health.   Obesity w/ Body mass index is 34.38 kg/m.: Will benefit with PCP follow-up, weight loss  healthy lifestyle and outpatient sleep evaluation.  Consults: none Subjective: Aaox3 feels better and eager to go home today  Discharge Exam: Vitals:   04/08/23 0431 04/08/23 1217  BP: 134/63 124/64  Pulse: 91 (!) 101  Resp: 15   Temp: 97.8 F (36.6 C) (!) 97.5 F (36.4 C)  SpO2: 90% (!) 84%   General: Pt is alert, awake, not in acute distress Cardiovascular: RRR, S1/S2 +, no rubs, no gallops Respiratory: CTA bilaterally, no wheezing, no rhonchi Abdominal: Soft, NT, ND, bowel sounds + Extremities: no edema, no cyanosis  Discharge Instructions  Discharge Instructions     Discharge instructions   Complete by: As directed    Please call call MD or return to ER for similar or worsening recurring problem that brought you to hospital or if any fever,nausea/vomiting,abdominal pain, uncontrolled pain, chest  pain,  shortness of breath or any other alarming symptoms.  Please follow-up your doctor as instructed in a week time and call the office for appointment.  Please avoid alcohol, smoking, or any other illicit substance and maintain healthy habits including taking your regular medications as prescribed.  You were cared for by a hospitalist during your  hospital stay. If you have any questions about your discharge medications or the care you received while you were in the hospital after you are discharged, you can call the unit and ask to speak with the hospitalist on call if the hospitalist that took care of you is not available.  Once you are discharged, your primary care physician will handle any further medical issues. Please note that NO REFILLS for any discharge medications will be authorized once you are discharged, as it is imperative that you return to your primary care physician (or establish a relationship with a primary care physician if you do not have one) for your aftercare needs so that they can reassess your need for medications and monitor your lab values   Discharge wound care:   Complete by: As directed    leanse LE wounds with saline, pat dry.  Cover weeping areas/wounds with single layer of xeroform, top with foam. Wrap legs from toes to knees with kerlix and 4" ACE wraps. Change every other day   Increase activity slowly   Complete by: As directed       Allergies as of 04/08/2023       Reactions   Sulfa Antibiotics Hives, Swelling, Other (See Comments)   Facial/eye swelling    Coreg [carvedilol] Other (See Comments)   Memory loss   Motrin [ibuprofen] Palpitations   Toprol Xl [metoprolol Tartrate] Cough   Doxycycline Hyclate Other (See Comments)   HEARTBURN   Flovent Hfa [fluticasone] Itching   Statins Other (See Comments)   MENTAL STATUS CHANGE        Medication List     TAKE these medications    acetaminophen 325 MG tablet Commonly known as: TYLENOL Take 2 tablets (650 mg total) by mouth 2 (two) times daily.   apixaban 5 MG Tabs tablet Commonly known as: Eliquis Take 1 tablet (5 mg total) by mouth 2 (two) times daily.   cefadroxil 500 MG capsule Commonly known as: DURICEF Take 1 capsule (500 mg total) by mouth 2 (two) times daily for 4 days.   cyanocobalamin 1000 MCG tablet Take 1 tablet (1,000  mcg total) by mouth daily.   gabapentin 100 MG capsule Commonly known as: NEURONTIN Take 1 capsule (100 mg total) by mouth 3 (three) times daily.   hydrOXYzine 10 MG tablet Commonly known as: ATARAX Take 1 tablet (10 mg total) by mouth 3 (three) times daily as needed for itching.   ondansetron 4 MG tablet Commonly known as: ZOFRAN Take 1 tablet (4 mg total) by mouth every 8 (eight) hours as needed for up to 30 doses for nausea or vomiting.   phenazopyridine 100 MG tablet Commonly known as: PYRIDIUM Take 1 tablet (100 mg total) by mouth 3 (three) times daily as needed (dysurea).   pramipexole 1 MG tablet Commonly known as: MIRAPEX Take 1 tablet (1 mg total) by mouth every evening.   traZODone 100 MG tablet Commonly known as: DESYREL Take 1 tablet (100 mg total) by mouth at bedtime as needed for sleep.               Discharge Care Instructions  (From admission,  onward)           Start     Ordered   04/08/23 0000  Discharge wound care:       Comments: leanse LE wounds with saline, pat dry.  Cover weeping areas/wounds with single layer of xeroform, top with foam. Wrap legs from toes to knees with kerlix and 4" ACE wraps. Change every other day   04/08/23 1247            Follow-up Information     [facility] Follow up.   Contact information: [address] [place] [address] [phone]               Allergies  Allergen Reactions   Sulfa Antibiotics Hives, Swelling and Other (See Comments)    Facial/eye swelling    Coreg [Carvedilol] Other (See Comments)    Memory loss   Motrin [Ibuprofen] Palpitations   Toprol Xl [Metoprolol Tartrate] Cough   Doxycycline Hyclate Other (See Comments)    HEARTBURN   Flovent Hfa [Fluticasone] Itching   Statins Other (See Comments)    MENTAL STATUS CHANGE    The results of significant diagnostics from this hospitalization (including imaging,  microbiology, ancillary and laboratory) are listed below for reference.    Microbiology: Recent Results (from the past 240 hours)  Urine Culture (for pregnant, neutropenic or urologic patients or patients with an indwelling urinary catheter)     Status: Abnormal   Collection Time: 04/03/23 10:06 PM   Specimen: Urine, Clean Catch  Result Value Ref Range Status   Specimen Description   Final    URINE, CLEAN CATCH Performed at [facility], [address], [place], [place] [address]    Special Requests   Final    NONE Performed at [facility], [address], [place], [place] [address]    Culture >=100,000 COLONIES/mL ESCHERICHIA COLI (A)  Final   Report Status 04/06/2023 FINAL  Final   Organism ID, Bacteria ESCHERICHIA COLI (A)  Final      Susceptibility   Escherichia coli - MIC*    AMPICILLIN 16 INTERMEDIATE Intermediate     CEFAZOLIN <=4 SENSITIVE Sensitive     CEFEPIME <=0.12 SENSITIVE Sensitive     CEFTRIAXONE <=0.25 SENSITIVE Sensitive     CIPROFLOXACIN <=0.25 SENSITIVE Sensitive     GENTAMICIN <=1 SENSITIVE Sensitive     IMIPENEM <=0.25 SENSITIVE Sensitive     NITROFURANTOIN <=16 SENSITIVE Sensitive     TRIMETH/SULFA <=20 SENSITIVE Sensitive     AMPICILLIN/SULBACTAM 4 SENSITIVE Sensitive     PIP/TAZO <=4 SENSITIVE Sensitive ug/mL    * >=100,000 COLONIES/mL ESCHERICHIA COLI  Blood culture (routine x 2)     Status: None   Collection Time: 04/03/23 10:20 PM   Specimen: Left Antecubital; Blood  Result Value Ref Range Status   Specimen Description   Final    LEFT ANTECUBITAL Performed at [facility], [address], [place], [place] [address]    Special Requests   Final    BOTTLES DRAWN AEROBIC AND ANAEROBIC Blood Culture adequate volume Performed at [facility], [address] [place], [place] [address]    Culture   Final    NO GROWTH 5 DAYS Performed at [facility], [address], [place], [place] [address]    Report Status 04/08/2023 FINAL  Final  Blood culture (routine x 2)     Status: None  Collection Time: 04/03/23 10:35 PM   Specimen: Right Antecubital; Blood  Result Value Ref Range Status   Specimen Description   Final    RIGHT ANTECUBITAL Performed at [facility], [address], [place], [place] [address]    Special Requests   Final    BOTTLES DRAWN AEROBIC AND ANAEROBIC Blood Culture results may not be optimal due to an inadequate volume of blood received in culture bottles Performed at [facility], [address], [place], [place] [address]    Culture   Final    NO GROWTH 5 DAYS Performed at [facility], [address] [place], [place] [address]    Report Status 04/08/2023 FINAL  Final    Procedures/Studies: No results found.  Labs: BNP (last 3 results) Recent Labs    04/28/22 1549 05/20/22 2259 04/03/23 2231  BNP 32.5 12.2 32.4   Basic Metabolic Panel: Recent Labs  Lab 04/03/23 1618 04/04/23 0832 04/05/23 0438 04/08/23 0424  NA 138 137 136 136  K 3.0* 3.9 4.1 4.0  CL 103 103 104 101  CO2 28 29 25 27   GLUCOSE 104* 147* 101* 121*  BUN 11 9 9 9   CREATININE 0.74 0.64 0.73 0.63  CALCIUM 8.3* 8.0* 7.7* 8.4*   Liver Function Tests: Recent Labs  Lab 04/03/23 1618 04/04/23 0832  AST 24 25  ALT 15 14  ALKPHOS 78 73  BILITOT 0.6 0.9  PROT 5.8* 5.7*  ALBUMIN 3.0* 2.7*   No results for input(s): "LIPASE", "AMYLASE" in the last 168 hours. No results for input(s): "AMMONIA" in the last 168 hours. CBC: Recent Labs  Lab 04/03/23 1618 04/04/23 0832 04/08/23 0424  WBC 6.9 6.5 6.1  HGB 12.3 12.3 12.6  HCT 37.6 38.6 39.4  MCV 89.3 91.9 91.6  PLT 178 172 177   Cardiac Enzymes: No results for input(s): "CKTOTAL", "CKMB", "CKMBINDEX", "TROPONINI" in the last 168 hours. BNP: Invalid input(s): "POCBNP" CBG: No results for input(s): "GLUCAP" in the last 168 hours. D-Dimer No  results for input(s): "DDIMER" in the last 72 hours. Hgb A1c No results for input(s): "HGBA1C" in the last 72 hours. Lipid Profile No results for input(s): "CHOL", "HDL", "LDLCALC", "TRIG", "CHOLHDL", "LDLDIRECT" in the last 72 hours. Thyroid function studies No results for input(s): "TSH", "T4TOTAL", "T3FREE", "THYROIDAB" in the last 72 hours.  Invalid input(s): "FREET3" Anemia work up No results for input(s): "VITAMINB12", "FOLATE", "FERRITIN", "TIBC", "IRON", "RETICCTPCT" in the last 72 hours. Urinalysis    Component Value Date/Time   COLORURINE AMBER (A) 04/03/2023 2045   APPEARANCEUR HAZY (A) 04/03/2023 2045   APPEARANCEUR Clear 09/19/2016 1633   LABSPEC 1.006 04/03/2023 2045   PHURINE 5.0 04/03/2023 2045   GLUCOSEU NEGATIVE 04/03/2023 2045   HGBUR SMALL (A) 04/03/2023 2045   BILIRUBINUR NEGATIVE 04/03/2023 2045   BILIRUBINUR Negative 09/19/2016 1633   KETONESUR NEGATIVE 04/03/2023 2045   PROTEINUR NEGATIVE 04/03/2023 2045   UROBILINOGEN 1.0 11/30/2014 2105   NITRITE POSITIVE (A) 04/03/2023 2045   LEUKOCYTESUR MODERATE (A) 04/03/2023 2045   Sepsis Labs Recent Labs  Lab 04/03/23 1618 04/04/23 0832 04/08/23 0424  WBC 6.9 6.5 6.1   Microbiology Recent Results (from the past 240 hours)  Urine Culture (for pregnant, neutropenic or urologic patients or patients with an indwelling urinary catheter)     Status: Abnormal   Collection Time: 04/03/23 10:06 PM   Specimen: Urine, Clean Catch  Result Value Ref Range Status   Specimen Description   Final  URINE, CLEAN CATCH Performed at [facility], [address] [place], [place] [address]    Special Requests   Final    NONE Performed at [facility], [address], [place], [place] [address]    Culture >=100,000 COLONIES/mL ESCHERICHIA COLI (A)  Final   Report Status 04/06/2023 FINAL  Final   Organism ID, Bacteria ESCHERICHIA COLI (A)  Final      Susceptibility   Escherichia  coli - MIC*    AMPICILLIN 16 INTERMEDIATE Intermediate     CEFAZOLIN <=4 SENSITIVE Sensitive     CEFEPIME <=0.12 SENSITIVE Sensitive     CEFTRIAXONE <=0.25 SENSITIVE Sensitive     CIPROFLOXACIN <=0.25 SENSITIVE Sensitive     GENTAMICIN <=1 SENSITIVE Sensitive     IMIPENEM <=0.25 SENSITIVE Sensitive     NITROFURANTOIN <=16 SENSITIVE Sensitive     TRIMETH/SULFA <=20 SENSITIVE Sensitive     AMPICILLIN/SULBACTAM 4 SENSITIVE Sensitive     PIP/TAZO <=4 SENSITIVE Sensitive ug/mL    * >=100,000 COLONIES/mL ESCHERICHIA COLI  Blood culture (routine x 2)     Status: None   Collection Time: 04/03/23 10:20 PM   Specimen: Left Antecubital; Blood  Result Value Ref Range Status   Specimen Description   Final    LEFT ANTECUBITAL Performed at [facility], [address] [place], [place] [address]    Special Requests   Final    BOTTLES DRAWN AEROBIC AND ANAEROBIC Blood Culture adequate volume Performed at [facility], [address], [place], [place] [address]    Culture   Final    NO GROWTH 5 DAYS Performed at [facility], [address] [place], [place] [address]    Report Status 04/08/2023 FINAL  Final  Blood culture (routine x 2)     Status: None   Collection Time: 04/03/23 10:35 PM   Specimen: Right Antecubital; Blood  Result Value Ref Range Status   Specimen Description   Final    RIGHT ANTECUBITAL Performed at [facility], [address] [place], [place] [address]    Special Requests   Final    BOTTLES DRAWN AEROBIC AND ANAEROBIC Blood Culture results may not be optimal due to an inadequate volume of blood received in culture bottles Performed at [facility], [address], [place], [place] [address]    Culture   Final    NO GROWTH 5 DAYS Performed at [facility], [address] [place], [place] [address]    Report Status 04/08/2023 FINAL  Final     Time coordinating discharge: 35  minutes  SIGNED: Lanae Boast, MD  [facility] 04/08/2023, 12:47 PM  If 7PM-7AM, please contact night-coverage [email or web address]

## 2023-04-08 NOTE — Progress Notes (Signed)
Pt removed IV access. No longer bleeding. IV team consulted.

## 2023-04-08 NOTE — Progress Notes (Signed)
MD notified pt 02 sats 84% RA - CXR and IV Lasix ordered d/c orders cancelled per MD.

## 2023-04-08 NOTE — Progress Notes (Signed)
Physical Therapy Treatment Patient Details Name: Denise Kelly MRN: [id number] DOB: 12/30/1940 Today's Date: 04/08/2023   History of Present Illness Patient is a 83 year old female who presented with dysuria.patient was admitted with cellulitis and acute cystitis.PMH:  chronic pain syndrome, medical noncompliance, R TKA, L TKA, lumbar fusion, chronic lymphedema,    PT Comments  The patient ambulated x 25' with Rw , min assist for safety. Patient  plans to Dc back to her ILF with support .  Rec. HHPT.    If plan is discharge home, recommend the following: A little help with walking and/or transfers;A little help with bathing/dressing/bathroom;Assistance with cooking/housework;Assist for transportation;Help with stairs or ramp for entrance   Can travel by private vehicle        Equipment Recommendations  None recommended by PT    Recommendations for Other Services       Precautions / Restrictions Precautions Precautions: Fall Precaution Comments: LE cellulitis Restrictions Weight Bearing Restrictions Per Provider Order: No     Mobility  Bed Mobility               General bed mobility comments: with increased time and encouragement. patient sleeps in recliner at ALF.    Transfers   Equipment used: Rolling walker (2 wheels) Transfers: Sit to/from Stand Sit to Stand: Min assist, +2 safety/equipment, From elevated surface                Ambulation/Gait Ambulation/Gait assistance: Min assist, +2 safety/equipment Gait Distance (Feet): 25 Feet Assistive device: Rolling walker (2 wheels) Gait Pattern/deviations: Step-to pattern, Step-through pattern Gait velocity: decr     General Gait Details: gait slow, stops for breaks x 3   Stairs             Wheelchair Mobility     Tilt Bed    Modified Rankin (Stroke Patients Only)       Balance Overall balance assessment: Needs assistance Sitting-balance support: Feet supported Sitting balance-Leahy  Scale: Fair     Standing balance support: Reliant on assistive device for balance, Bilateral upper extremity supported Standing balance-Leahy Scale: Poor                              Cognition Arousal: Alert Behavior During Therapy: Flat affect Overall Cognitive Status: History of cognitive impairments - at baseline                                 General Comments: patient agreeble to engage in session today        Exercises      General Comments        Pertinent Vitals/Pain Pain Assessment Faces Pain Scale: Hurts little more Pain Location: BLE with movement Pain Descriptors / Indicators: Grimacing, Discomfort Pain Intervention(s): Monitored during session    Home Living                          Prior Function            PT Goals (current goals can now be found in the care plan section) Progress towards PT goals: Progressing toward goals    Frequency    Min 1X/week      PT Plan      Co-evaluation PT/OT/SLP Co-Evaluation/Treatment: Yes Reason for Co-Treatment: To address functional/ADL transfers;Necessary to address cognition/behavior during functional activity  PT goals addressed during session: Mobility/safety with mobility OT goals addressed during session: ADL's and self-care      AM-PAC PT "6 Clicks" Mobility   Outcome Measure  Help needed turning from your back to your side while in a flat bed without using bedrails?: A Little Help needed moving from lying on your back to sitting on the side of a flat bed without using bedrails?: A Little Help needed moving to and from a bed to a chair (including a wheelchair)?: A Little Help needed standing up from a chair using your arms (e.g., wheelchair or bedside chair)?: A Little Help needed to walk in hospital room?: Total   6 Click Score: 13    End of Session Equipment Utilized During Treatment: Gait belt Activity Tolerance: Patient tolerated treatment well;Patient  limited by pain Patient left: in chair;with call bell/phone within reach   PT Visit Diagnosis: Unsteadiness on feet (R26.81);Muscle weakness (generalized) (M62.81);Pain Pain - Right/Left: Right Pain - part of body: Leg     Time: 1104-1130 PT Time Calculation (min) (ACUTE ONLY): 26 min  Charges:    $Gait Training: 8-22 mins PT General Charges $$ ACUTE PT VISIT: 1 Visit                     Blanchard Kelch PT Acute Rehabilitation Services Office [phone] Weekend [phone]    Rada Hay 04/08/2023, 1:15 PM

## 2023-04-08 NOTE — Progress Notes (Signed)
Occupational Therapy Treatment Patient Details Name: Denise Kelly MRN: [id number] DOB: 07-Jun-1940 Today's Date: 04/08/2023   History of present illness Patient is a 83 year old female who presented with dysuria.patient was admitted with cellulitis and acute cystitis.PMH:  chronic pain syndrome, medical noncompliance, R TKA, L TKA, lumbar fusion, chronic lymphedema,   OT comments  Patient was able to engage in bed mobility and functional mobility into hallway with reduced physical a compared to evaluation. Patient at baseline is self limiting and needing caregiver support at ALF setting. Patient appears to be near baseline with plan to transition to ALF with [facility] services. Patient would continue to benefit from skilled OT services at this time while admitted and after d/c to address noted deficits in order to improve overall safety and independence in ADLs.        If plan is discharge home, recommend the following:  A lot of help with bathing/dressing/bathroom;A lot of help with walking and/or transfers;Assistance with cooking/housework;Direct supervision/assist for medications management;Assist for transportation;Help with stairs or ramp for entrance;Direct supervision/assist for financial management   Equipment Recommendations  None recommended by OT       Precautions / Restrictions Precautions Precautions: Fall Restrictions Weight Bearing Restrictions Per Provider Order: No       Mobility Bed Mobility Overal bed mobility: Needs Assistance Bed Mobility: Supine to Sit     Supine to sit: Min assist, HOB elevated     General bed mobility comments: with increased time and encouragement. patient sleeps in recliner at ALF.         Balance Overall balance assessment: Needs assistance Sitting-balance support: Feet supported Sitting balance-Leahy Scale: Fair     Standing balance support: Reliant on assistive device for balance, Bilateral upper extremity supported Standing  balance-Leahy Scale: Poor         ADL either performed or assessed with clinical judgement      Cognition Arousal: Alert Behavior During Therapy: WFL for tasks assessed/performed Overall Cognitive Status: History of cognitive impairments - at baseline               General Comments: patient agreeble to engage in session today                   Pertinent Vitals/ Pain       Pain Assessment Pain Assessment: Faces Faces Pain Scale: Hurts little more Pain Location: BLE with movement Pain Descriptors / Indicators: Grimacing, Discomfort Pain Intervention(s): Limited activity within patient's tolerance, Monitored during session, Premedicated before session         Frequency  Min 1X/week        Progress Toward Goals  OT Goals(current goals can now be found in the care plan section)  Progress towards OT goals: Progressing toward goals     Plan      Co-evaluation        PT goals addressed during session: Mobility/safety with mobility OT goals addressed during session: ADL's and self-care      AM-PAC OT "6 Clicks" Daily Activity     Outcome Measure   Help from another person eating meals?: A Little Help from another person taking care of personal grooming?: A Little Help from another person toileting, which includes using toliet, bedpan, or urinal?: A Lot Help from another person bathing (including washing, rinsing, drying)?: A Lot Help from another person to put on and taking off regular upper body clothing?: A Little Help from another person to put on and taking off regular  lower body clothing?: A Lot 6 Click Score: 15    End of Session Equipment Utilized During Treatment: Gait belt;Rolling walker (2 wheels)  OT Visit Diagnosis: Unsteadiness on feet (R26.81);Pain;History of falling (Z91.81);Other abnormalities of gait and mobility (R26.89)   Activity Tolerance Patient tolerated treatment well   Patient Left in chair;with call bell/phone within  reach   Nurse Communication Mobility status        Time: 4239-5320 OT Time Calculation (min): 27 min  Charges: OT General Charges $OT Visit: 1 Visit OT Treatments $Therapeutic Activity: 8-22 mins  Rosalio Loud, MS Acute Rehabilitation Department Office# [phone]   Selinda Flavin 04/08/2023, 12:14 PM

## 2023-04-08 NOTE — Plan of Care (Signed)
  Problem: Education: Goal: Knowledge of General Education information will improve Description: Including pain rating scale, medication(s)/side effects and non-pharmacologic comfort measures Outcome: Progressing   Problem: Health Behavior/Discharge Planning: Goal: Ability to manage health-related needs will improve Outcome: Not Progressing   Problem: Activity: Goal: Risk for activity intolerance will decrease Outcome: Progressing   Problem: Coping: Goal: Level of anxiety will decrease Outcome: Not Progressing   Problem: Elimination: Goal: Will not experience complications related to urinary retention Outcome: Not Progressing

## 2023-04-09 ENCOUNTER — Other Ambulatory Visit (HOSPITAL_COMMUNITY): Payer: Self-pay

## 2023-04-09 DIAGNOSIS — L03119 Cellulitis of unspecified part of limb: Secondary | ICD-10-CM | POA: Diagnosis not present

## 2023-04-09 DIAGNOSIS — L03115 Cellulitis of right lower limb: Principal | ICD-10-CM

## 2023-04-09 DIAGNOSIS — B9620 Unspecified Escherichia coli [E. coli] as the cause of diseases classified elsewhere: Secondary | ICD-10-CM

## 2023-04-09 DIAGNOSIS — L03116 Cellulitis of left lower limb: Secondary | ICD-10-CM

## 2023-04-09 LAB — BASIC METABOLIC PANEL
Anion gap: 10 (ref 5–15)
BUN: 13 mg/dL (ref 8–23)
CO2: 32 mmol/L (ref 22–32)
Calcium: 8.9 mg/dL (ref 8.9–10.3)
Chloride: 95 mmol/L — ABNORMAL LOW (ref 98–111)
Creatinine, Ser: 0.51 mg/dL (ref 0.44–1.00)
GFR, Estimated: >60 mL/min (ref >60)
Glucose, Bld: 136 mg/dL — ABNORMAL HIGH (ref 70–99)
Potassium: 4 mmol/L (ref 3.5–5.1)
Sodium: 137 mmol/L (ref 135–145)

## 2023-04-09 LAB — CBC
HCT: 42.5 % (ref 36.0–46.0)
Hemoglobin: 13.7 g/dL (ref 12.0–15.0)
MCH: 29.2 pg (ref 26.0–34.0)
MCHC: 32.2 g/dL (ref 30.0–36.0)
MCV: 90.6 fL (ref 80.0–100.0)
Platelets: 176 10*3/uL (ref 150–400)
RBC: 4.69 MIL/uL (ref 3.87–5.11)
RDW: 13.2 % (ref 11.5–15.5)
WBC: 6.3 10*3/uL (ref 4.0–10.5)
nRBC: 0 % (ref 0.0–0.2)

## 2023-04-09 MED ORDER — IPRATROPIUM-ALBUTEROL 0.5-2.5 (3) MG/3ML IN SOLN
3.0000 mL | Freq: Two times a day (BID) | RESPIRATORY_TRACT | Status: DC
  Administered 2023-04-09 – 2023-04-10 (×2): 3 mL via RESPIRATORY_TRACT
  Filled 2023-04-09 (×2): qty 3

## 2023-04-09 NOTE — Discharge Summary (Signed)
Physician Discharge Summary  Denise FRIEDERICHS Kelly:[id number] DOB: 04-22-1940 DOA: 04/03/2023  PCP: Pcp, No  Admit date: 04/03/2023 Discharge date: 04/09/2023 Recommendations for Outpatient Follow-up:  Follow up with PCP in 1 weeks-call for appointment Please obtain BMP/CBC in one week  Discharge Dispo: Home w/. [facility] Discharge Condition: Stable Code Status:   Code Status: Full Code Diet recommendation:  Diet Order             Diet Heart Room service appropriate? Yes; Fluid consistency: Thin  Diet effective now                    Brief/Interim Summary: 78 yof w/ Chronic diastolic heart failure preserved EF 60 to 65%, HTN, restless leg syndrome, adult failure to thrive, insomnia, h/o PE, chronic depression, chronic lymphedema, chronic constipation, chronic pain syndrome, and GERD presented to ED from [facility] for evaluation for dysuria x 1 week and also w/ increasing pain and swelling of the bilateral lower extremities from baseline lymphedema x 4-5 days In WJ:XBJYN rate 113 and borderline hypotensive. Blood cultures x 2 sent along with urine culture, patient was admitted for acute cystitis and cellulitis of the bilateral lower extremities.  Urine culture growing E. Coli, being managed with IV antibiotics. PT OT following closely recommending home health PT OT on discharge.  Overall she is clinically improved leg cellulitis looks much better.  PT OT has cleared for home health PT OT    Discharge Diagnoses:  Principal Problem:   Lower extremity cellulitis Active Problems:   Cellulitis of bilateral lower extremities   Acute cystitis   Chronic diastolic CHF (congestive heart failure) (HCC)   Hypokalemia   GERD without esophagitis   RLS (restless legs syndrome)   Chronic depression   Pain syndrome, chronic   Lymphedema   History of pulmonary embolism  Bilateral lymphedema with lower extremity cellulitis:  Patient has chronic lymphedema with worsening swelling and cellulitis,  fortunately lactic acid normal blood culture no growth.  Manage with IV antibiotics local wound care at this time clinically improved, has mild redness > she will complete oral antibiotics, continue wound care.  Will plan on home health RN on dc   Acute E. coli cystitis: Continue abx as above  cont prn Pyridium   Hypokalemia: Resolved   Insomnia: Continue trazodone bedtime   PE history: Continue home Eliquis   Acute hypoxic respiratory failure saturating 84% on RA: Appears asymptomatic history of PE -No chest pain or shortness of breath.  On exam has bilateral basal crackles -BNP okay given dose of Lasix, CT angio chest negative for PE., at this time her respiratory status stable, question underlying COPD given her smoking history in the past about a pack a day.  Needing 2 L oxygen to maintain pulse ox.  At times refusing oxygen but she is agreeable to go home on oxygen. Will set up home DME.  Will    GAD Chronic depression RLS: Continue Atarax 10 mg 3 times daily as needed, home pramipexole and supportive care   Debility deconditioning Age-related frailty: PT recommending home health and has been arranged.   Obesity w/ Body mass index is 34.38 kg/m.: Will benefit with PCP follow-up, weight loss  healthy lifestyle and outpatient sleep evaluation.  Consults: NONE Subjective: Alert awake resting comfortably eager to go home today  Discharge Exam: Vitals:   04/09/23 0850 04/09/23 1215  BP:  (!) 131/118  Pulse: 89 91  Resp:  18  Temp:  97.8 F (36.6  C)  SpO2: (!) 85% 91%   General: Pt is alert, awake, not in acute distress Cardiovascular: RRR, S1/S2 +, no rubs, no gallops Respiratory: CTA bilaterally, no wheezing, no rhonchi Abdominal: Soft, NT, ND, bowel sounds + Extremities: no edema, no cyanosis  Discharge Instructions  Discharge Instructions     Discharge instructions   Complete by: As directed    Please call call MD or return to ER for similar or worsening  recurring problem that brought you to hospital or if any fever,nausea/vomiting,abdominal pain, uncontrolled pain, chest pain,  shortness of breath or any other alarming symptoms.  Please follow-up your doctor as instructed in a week time and call the office for appointment.  Please avoid alcohol, smoking, or any other illicit substance and maintain healthy habits including taking your regular medications as prescribed.  You were cared for by a hospitalist during your hospital stay. If you have any questions about your discharge medications or the care you received while you were in the hospital after you are discharged, you can call the unit and ask to speak with the hospitalist on call if the hospitalist that took care of you is not available.  Once you are discharged, your primary care physician will handle any further medical issues. Please note that NO REFILLS for any discharge medications will be authorized once you are discharged, as it is imperative that you return to your primary care physician (or establish a relationship with a primary care physician if you do not have one) for your aftercare needs so that they can reassess your need for medications and monitor your lab values   Discharge wound care:   Complete by: As directed    leanse LE wounds with saline, pat dry.  Cover weeping areas/wounds with single layer of xeroform, top with foam. Wrap legs from toes to knees with kerlix and 4" ACE wraps. Change every other day   Increase activity slowly   Complete by: As directed       Allergies as of 04/09/2023       Reactions   Sulfa Antibiotics Hives, Swelling, Other (See Comments)   Facial/eye swelling    Coreg [carvedilol] Other (See Comments)   Memory loss   Motrin [ibuprofen] Palpitations   Toprol Xl [metoprolol Tartrate] Cough   Doxycycline Hyclate Other (See Comments)   HEARTBURN   Flovent Hfa [fluticasone] Itching   Statins Other (See Comments)   MENTAL STATUS CHANGE         Medication List     TAKE these medications    acetaminophen 325 MG tablet Commonly known as: TYLENOL Take 2 tablets (650 mg total) by mouth 2 (two) times daily.   apixaban 5 MG Tabs tablet Commonly known as: Eliquis Take 1 tablet (5 mg total) by mouth 2 (two) times daily.   cefadroxil 500 MG capsule Commonly known as: DURICEF Take 1 capsule (500 mg total) by mouth 2 (two) times daily for 4 days.   cyanocobalamin 1000 MCG tablet Take 1 tablet (1,000 mcg total) by mouth daily.   gabapentin 100 MG capsule Commonly known as: NEURONTIN Take 1 capsule (100 mg total) by mouth 3 (three) times daily.   hydrOXYzine 10 MG tablet Commonly known as: ATARAX Take 1 tablet (10 mg total) by mouth 3 (three) times daily as needed for itching.   ondansetron 4 MG tablet Commonly known as: ZOFRAN Take 1 tablet (4 mg total) by mouth every 8 (eight) hours as needed for nausea or vomiting.  phenazopyridine 100 MG tablet Commonly known as: PYRIDIUM Take 1 tablet (100 mg total) by mouth 3 (three) times daily as needed (dysurea).   pramipexole 1 MG tablet Commonly known as: MIRAPEX Take 1 tablet (1 mg total) by mouth every evening.   traZODone 100 MG tablet Commonly known as: DESYREL Take 1 tablet (100 mg total) by mouth at bedtime as needed for sleep.               Durable Medical Equipment  (From admission, onward)           Start     Ordered   04/09/23 1223  For home use only DME oxygen  Once       Question Answer Comment  Length of Need Lifetime   Mode or (Route) Nasal cannula   Liters per Minute 2   Oxygen delivery system Gas      04/09/23 1223              Discharge Care Instructions  (From admission, onward)           Start     Ordered   04/08/23 0000  Discharge wound care:       Comments: leanse LE wounds with saline, pat dry.  Cover weeping areas/wounds with single layer of xeroform, top with foam. Wrap legs from toes to knees with kerlix and  4" ACE wraps. Change every other day   04/08/23 1247            Follow-up Information     [facility] Follow up.   Contact information: [address] [place] [address] [phone]               Allergies  Allergen Reactions   Sulfa Antibiotics Hives, Swelling and Other (See Comments)    Facial/eye swelling    Coreg [Carvedilol] Other (See Comments)    Memory loss   Motrin [Ibuprofen] Palpitations   Toprol Xl [Metoprolol Tartrate] Cough   Doxycycline Hyclate Other (See Comments)    HEARTBURN   Flovent Hfa [Fluticasone] Itching   Statins Other (See Comments)    MENTAL STATUS CHANGE    The results of significant diagnostics from this hospitalization (including imaging, microbiology, ancillary and laboratory) are listed below for reference.    Microbiology: Recent Results (from the past 240 hours)  Urine Culture (for pregnant, neutropenic or urologic patients or patients with an indwelling urinary catheter)     Status: Abnormal   Collection Time: 04/03/23 10:06 PM   Specimen: Urine, Clean Catch  Result Value Ref Range Status   Specimen Description   Final    URINE, CLEAN CATCH Performed at [facility], [address] [place], [place] [address]    Special Requests   Final    NONE Performed at [facility], [address], [place], [place] [address]    Culture >=100,000 COLONIES/mL ESCHERICHIA COLI (A)  Final   Report Status 04/06/2023 FINAL  Final   Organism ID, Bacteria ESCHERICHIA COLI (A)  Final      Susceptibility   Escherichia coli - MIC*    AMPICILLIN 16 INTERMEDIATE Intermediate     CEFAZOLIN <=4 SENSITIVE Sensitive     CEFEPIME <=0.12 SENSITIVE Sensitive     CEFTRIAXONE <=0.25 SENSITIVE Sensitive     CIPROFLOXACIN <=0.25 SENSITIVE Sensitive     GENTAMICIN <=1 SENSITIVE Sensitive     IMIPENEM <=0.25 SENSITIVE Sensitive  NITROFURANTOIN <=16  SENSITIVE Sensitive     TRIMETH/SULFA <=20 SENSITIVE Sensitive     AMPICILLIN/SULBACTAM 4 SENSITIVE Sensitive     PIP/TAZO <=4 SENSITIVE Sensitive ug/mL    * >=100,000 COLONIES/mL ESCHERICHIA COLI  Blood culture (routine x 2)     Status: None   Collection Time: 04/03/23 10:20 PM   Specimen: Left Antecubital; Blood  Result Value Ref Range Status   Specimen Description   Final    LEFT ANTECUBITAL Performed at [facility], [address], [place], [place] [address]    Special Requests   Final    BOTTLES DRAWN AEROBIC AND ANAEROBIC Blood Culture adequate volume Performed at [facility], [address], [place], [place] [address]    Culture   Final    NO GROWTH 5 DAYS Performed at [facility], [address], [place], [place] [address]    Report Status 04/08/2023 FINAL  Final  Blood culture (routine x 2)     Status: None   Collection Time: 04/03/23 10:35 PM   Specimen: Right Antecubital; Blood  Result Value Ref Range Status   Specimen Description   Final    RIGHT ANTECUBITAL Performed at [facility], [address], [place], [place] [address]    Special Requests   Final    BOTTLES DRAWN AEROBIC AND ANAEROBIC Blood Culture results may not be optimal due to an inadequate volume of blood received in culture bottles Performed at [facility], [address], [place], [place] [address]    Culture   Final    NO GROWTH 5 DAYS Performed at [facility], [address], [place], [place] [address]    Report Status 04/08/2023 FINAL  Final    Procedures/Studies: CT Angio Chest Pulmonary Embolism (PE) W or WO Contrast Result Date: 04/08/2023 CLINICAL DATA:  Acute hypoxia EXAM: CT ANGIOGRAPHY CHEST WITH CONTRAST TECHNIQUE: Multidetector CT imaging of the chest was performed using the standard protocol during bolus administration of intravenous contrast. Multiplanar CT image reconstructions and MIPs were  obtained to evaluate the vascular anatomy. RADIATION DOSE REDUCTION: This exam was performed according to the departmental dose-optimization program which includes automated exposure control, adjustment of the mA and/or kV according to patient size and/or use of iterative reconstruction technique. CONTRAST:  75mL OMNIPAQUE IOHEXOL 350 MG/ML SOLN COMPARISON:  Chest x-ray 04/08/2023, CT chest 01/23/2018 FINDINGS: Cardiovascular: Motion degradation limits the examination. Allowing for this, no definite acute pulmonary embolus visualized to the segmental level. Mild atherosclerosis. Coronary vascular calcification. Normal cardiac size. No pericardial effusion Mediastinum/Nodes: Patent trachea. No suspicious thyroid mass. No suspicious lymph nodes. Esophagus within normal limits. Lungs/Pleura: No pleural effusion or pneumothorax. Mild chronic scarring and minimal bronchiectasis in the right upper lobe. Mild subpleural consolidation in the right lower lobe with adjacent ground-glass density. Upper Abdomen: No acute findings. Multiple hypodense cysts and subcentimeter hypodensities in the liver without significant change Musculoskeletal: No acute or suspicious osseous abnormality Review of the MIP images confirms the above findings. IMPRESSION: 1. Motion degradation limits the examination. Allowing for this, no definite acute pulmonary embolus is seen. 2. Mild subpleural consolidation in the right lower lobe, suspected to be due to chronic scarring and sequela of remote pulmonary infarct, given appearance on 2019 comparison exams. Aortic Atherosclerosis (ICD10-I70.0). Electronically Signed   By: Jasmine Pang M.D.   On: 04/08/2023 17:24   DG Chest Port 1 View Result Date: 04/08/2023 CLINICAL DATA:  Hypoxia EXAM: PORTABLE CHEST  1 VIEW COMPARISON:  X-ray 05/20/2022. FINDINGS: Underinflation. There is some linear opacity in the left lung base likely scar or atelectasis. No consolidation, pneumothorax or effusion. No edema.  Normal cardiopericardial silhouette. Calcified aorta. Degenerative changes along the spine. Osteopenia IMPRESSION: Underinflation. Slight linear opacity left lung base likely scar or atelectasis. Electronically Signed   By: Karen Kays M.D.   On: 04/08/2023 14:12    Labs: BNP (last 3 results) Recent Labs    05/20/22 2259 04/03/23 2231 04/08/23 1426  BNP 12.2 32.4 31.4   Basic Metabolic Panel: Recent Labs  Lab 04/03/23 1618 04/04/23 0832 04/05/23 0438 04/08/23 0424 04/09/23 0416  NA 138 137 136 136 137  K 3.0* 3.9 4.1 4.0 4.0  CL 103 103 104 101 95*  CO2 28 29 25 27  32  GLUCOSE 104* 147* 101* 121* 136*  BUN 11 9 9 9 13   CREATININE 0.74 0.64 0.73 0.63 0.51  CALCIUM 8.3* 8.0* 7.7* 8.4* 8.9   Liver Function Tests: Recent Labs  Lab 04/03/23 1618 04/04/23 0832  AST 24 25  ALT 15 14  ALKPHOS 78 73  BILITOT 0.6 0.9  PROT 5.8* 5.7*  ALBUMIN 3.0* 2.7*   No results for input(s): "LIPASE", "AMYLASE" in the last 168 hours. No results for input(s): "AMMONIA" in the last 168 hours. CBC: Recent Labs  Lab 04/03/23 1618 04/04/23 0832 04/08/23 0424 04/09/23 0416  WBC 6.9 6.5 6.1 6.3  HGB 12.3 12.3 12.6 13.7  HCT 37.6 38.6 39.4 42.5  MCV 89.3 91.9 91.6 90.6  PLT 178 172 177 176   Cardiac Enzymes: No results for input(s): "CKTOTAL", "CKMB", "CKMBINDEX", "TROPONINI" in the last 168 hours. BNP: Invalid input(s): "POCBNP" CBG: No results for input(s): "GLUCAP" in the last 168 hours. D-Dimer Recent Labs    04/08/23 1426  DDIMER 1.09*   Hgb A1c No results for input(s): "HGBA1C" in the last 72 hours. Lipid Profile No results for input(s): "CHOL", "HDL", "LDLCALC", "TRIG", "CHOLHDL", "LDLDIRECT" in the last 72 hours. Thyroid function studies No results for input(s): "TSH", "T4TOTAL", "T3FREE", "THYROIDAB" in the last 72 hours.  Invalid input(s): "FREET3" Anemia work up No results for input(s): "VITAMINB12", "FOLATE", "FERRITIN", "TIBC", "IRON", "RETICCTPCT" in the  last 72 hours. Urinalysis    Component Value Date/Time   COLORURINE AMBER (A) 04/03/2023 2045   APPEARANCEUR HAZY (A) 04/03/2023 2045   APPEARANCEUR Clear 09/19/2016 1633   LABSPEC 1.006 04/03/2023 2045   PHURINE 5.0 04/03/2023 2045   GLUCOSEU NEGATIVE 04/03/2023 2045   HGBUR SMALL (A) 04/03/2023 2045   BILIRUBINUR NEGATIVE 04/03/2023 2045   BILIRUBINUR Negative 09/19/2016 1633   KETONESUR NEGATIVE 04/03/2023 2045   PROTEINUR NEGATIVE 04/03/2023 2045   UROBILINOGEN 1.0 11/30/2014 2105   NITRITE POSITIVE (A) 04/03/2023 2045   LEUKOCYTESUR MODERATE (A) 04/03/2023 2045   Sepsis Labs Recent Labs  Lab 04/03/23 1618 04/04/23 0832 04/08/23 0424 04/09/23 0416  WBC 6.9 6.5 6.1 6.3   Microbiology Recent Results (from the past 240 hours)  Urine Culture (for pregnant, neutropenic or urologic patients or patients with an indwelling urinary catheter)     Status: Abnormal   Collection Time: 04/03/23 10:06 PM   Specimen: Urine, Clean Catch  Result Value Ref Range Status   Specimen Description   Final    URINE, CLEAN CATCH Performed at [facility], [address], [place], [place] [address]    Special Requests   Final    NONE Performed at [facility], [address] [place],  [place] [address]    Culture >=100,000 COLONIES/mL ESCHERICHIA COLI (A)  Final   Report Status 04/06/2023 FINAL  Final   Organism ID, Bacteria ESCHERICHIA COLI (A)  Final      Susceptibility   Escherichia coli - MIC*    AMPICILLIN 16 INTERMEDIATE Intermediate     CEFAZOLIN <=4 SENSITIVE Sensitive     CEFEPIME <=0.12 SENSITIVE Sensitive     CEFTRIAXONE <=0.25 SENSITIVE Sensitive     CIPROFLOXACIN <=0.25 SENSITIVE Sensitive     GENTAMICIN <=1 SENSITIVE Sensitive     IMIPENEM <=0.25 SENSITIVE Sensitive     NITROFURANTOIN <=16 SENSITIVE Sensitive     TRIMETH/SULFA <=20 SENSITIVE Sensitive     AMPICILLIN/SULBACTAM 4 SENSITIVE Sensitive     PIP/TAZO <=4 SENSITIVE  Sensitive ug/mL    * >=100,000 COLONIES/mL ESCHERICHIA COLI  Blood culture (routine x 2)     Status: None   Collection Time: 04/03/23 10:20 PM   Specimen: Left Antecubital; Blood  Result Value Ref Range Status   Specimen Description   Final    LEFT ANTECUBITAL Performed at [facility], [address] [place], [place] [address]    Special Requests   Final    BOTTLES DRAWN AEROBIC AND ANAEROBIC Blood Culture adequate volume Performed at [facility], [address] [place], [place] [address]    Culture   Final    NO GROWTH 5 DAYS Performed at [facility], [address] [place], [place] [address]    Report Status 04/08/2023 FINAL  Final  Blood culture (routine x 2)     Status: None   Collection Time: 04/03/23 10:35 PM   Specimen: Right Antecubital; Blood  Result Value Ref Range Status   Specimen Description   Final    RIGHT ANTECUBITAL Performed at [facility], [address] [place], [place] [address]    Special Requests   Final    BOTTLES DRAWN AEROBIC AND ANAEROBIC Blood Culture results may not be optimal due to an inadequate volume of blood received in culture bottles Performed at [facility], [address], [place], [place] [address]    Culture   Final    NO GROWTH 5 DAYS Performed at [facility], [address] [place], [place] [address]    Report Status 04/08/2023 FINAL  Final     Time coordinating discharge: 25 minutes  SIGNED: Lanae Boast, MD  [facility] 04/09/2023, 12:50 PM  If 7PM-7AM, please contact night-coverage [email or web address]

## 2023-04-09 NOTE — Progress Notes (Signed)
Pt 85% oxygenation on room air at rest.  Requires 2L to maintain >88% at rest.  Pt unable to ambulate or stand at this time

## 2023-04-09 NOTE — Progress Notes (Signed)
Call to Onalee Hua (pt's Medical POA ), he stated they were informed on 04/08/23 by TOC that pt would be transported back to the ILF by [facility]. This RN confirmed this plan in the noted, apologized for the confusion. This RN followed up w/ ED TOC  to contact [facility] for transport back to ILF. Primary RN updated on transportation plan via secure chat by yhis rN.

## 2023-04-09 NOTE — Progress Notes (Signed)
Discharge is awaiting for home O2 to be delivered

## 2023-04-09 NOTE — Progress Notes (Signed)
Room air 85%, pt in chair.  Pt refuses oxygen.  Pt educated and continued to refuse oxygen at this time.  Notifying MD

## 2023-04-09 NOTE — Progress Notes (Signed)
TOC discharge meds in a secure bag delivered to pt in room. Primary RN notified of med delivery. Call to [facility], pt's health care advocate, regarding pt's discharge back to ILF- voice mail left w/ call back # [phone].

## 2023-04-09 NOTE — Care Management (Signed)
Spoke with [facility] who reports [facility] has not been called for the patient. Patient has received home oxygen in room.  This RNCM called [facility] for pick up. Printed the medical necessity form to [employer]. RN Rushie Goltz will print the facesheet and place in PTAR folder for pick up.

## 2023-04-09 NOTE — Progress Notes (Signed)
Home Oxygen tank has been delivered to room.  Pt adivsed to call her caregiver now and have her come to the floor so she can be taught how to use this oxygen tank.  Pt verbalized understand.  Pt has complained several times today of her "bottom hurting."  Pt has been encouraged to let us help her reposition or return to the bed.  Pt has refused all offers even after continued education on the importance of repositioning.

## 2023-04-09 NOTE — Plan of Care (Signed)
  Problem: Education: Goal: Knowledge of General Education information will improve Description: Including pain rating scale, medication(s)/side effects and non-pharmacologic comfort measures 04/09/2023 1908 by Candyce Churn, RN Outcome: Adequate for Discharge 04/09/2023 0836 by Candyce Churn, RN Outcome: Progressing   Problem: Health Behavior/Discharge Planning: Goal: Ability to manage health-related needs will improve 04/09/2023 1908 by Candyce Churn, RN Outcome: Adequate for Discharge 04/09/2023 0836 by Candyce Churn, RN Outcome: Progressing   Problem: Clinical Measurements: Goal: Ability to maintain clinical measurements within normal limits will improve 04/09/2023 1908 by Candyce Churn, RN Outcome: Adequate for Discharge 04/09/2023 0836 by Candyce Churn, RN Outcome: Progressing Goal: Will remain free from infection 04/09/2023 1908 by Candyce Churn, RN Outcome: Adequate for Discharge 04/09/2023 0836 by Candyce Churn, RN Outcome: Progressing Goal: Diagnostic test results will improve 04/09/2023 1908 by Candyce Churn, RN Outcome: Adequate for Discharge 04/09/2023 0836 by Candyce Churn, RN Outcome: Progressing Goal: Respiratory complications will improve 04/09/2023 1908 by Candyce Churn, RN Outcome: Adequate for Discharge 04/09/2023 0836 by Candyce Churn, RN Outcome: Progressing Goal: Cardiovascular complication will be avoided 04/09/2023 1908 by Candyce Churn, RN Outcome: Adequate for Discharge 04/09/2023 0836 by Candyce Churn, RN Outcome: Progressing   Problem: Activity: Goal: Risk for activity intolerance will decrease 04/09/2023 1908 by Candyce Churn, RN Outcome: Adequate for Discharge 04/09/2023 [phone] by Candyce Churn, RN Outcome: Progressing   Problem: Nutrition: Goal: Adequate nutrition will be maintained 04/09/2023 1908 by Candyce Churn, RN Outcome: Adequate for Discharge 04/09/2023 [phone] by Candyce Churn, RN Outcome: Progressing   Problem:  Elimination: Goal: Will not experience complications related to bowel motility 04/09/2023 1908 by Candyce Churn, RN Outcome: Adequate for Discharge 04/09/2023 0836 by Candyce Churn, RN Outcome: Progressing Goal: Will not experience complications related to urinary retention 04/09/2023 1908 by Candyce Churn, RN Outcome: Adequate for Discharge 04/09/2023 0836 by Candyce Churn, RN Outcome: Progressing   Problem: Pain Managment: Goal: General experience of comfort will improve and/or be controlled 04/09/2023 1908 by Candyce Churn, RN Outcome: Adequate for Discharge 04/09/2023 0836 by Candyce Churn, RN Outcome: Progressing   Problem: Skin Integrity: Goal: Risk for impaired skin integrity will decrease 04/09/2023 1908 by Candyce Churn, RN Outcome: Adequate for Discharge 04/09/2023 0836 by Candyce Churn, RN Outcome: Progressing   Problem: Urinary Elimination: Goal: Signs and symptoms of infection will decrease 04/09/2023 1908 by Candyce Churn, RN Outcome: Adequate for Discharge 04/09/2023 0836 by Candyce Churn, RN Outcome: Progressing   Problem: Clinical Measurements: Goal: Ability to avoid or minimize complications of infection will improve 04/09/2023 1908 by Candyce Churn, RN Outcome: Adequate for Discharge 04/09/2023 0836 by Candyce Churn, RN Outcome: Progressing

## 2023-04-09 NOTE — TOC Transition Note (Signed)
Transition of Care [facility]) - Discharge Note   Patient Details  Name: Denise Kelly MRN: [id number] Date of Birth: 1940/05/06  Transition of Care [facility]) CM/SW Contact:  Otelia Santee, LCSW Phone Number: 04/09/2023, 2:05 PM   Clinical Narrative:    Pt to return to [facility] ILF w/ HHPT,OT,RN through [employer]. Pt with new O2 requirement. Home O2 has been ordered through [employer] and will be delivered to pt's room prior to discharge.    Final next level of care: Home w Home Health Services [facility]) Barriers to Discharge: Barriers Resolved   Patient Goals and CMS Choice Patient states their goals for this hospitalization and ongoing recovery are:: Return to baseline CMS Medicare.gov Compare Post Acute Care list provided to:: Patient Choice offered to / list presented to : Patient  ownership interest in [facility].provided to::  (N/A)    Discharge Placement                Patient to be transferred to facility by: [facility] Name of family member notified: [name] (Other)  [phone] Patient and family notified of of transfer: 04/08/23  Discharge Plan and Services Additional resources added to the After Visit Summary for   In-house Referral: Clinical Social Work Discharge Planning Services: NA Post Acute Care Choice: Home Health          DME Arranged: Oxygen DME Agency: Patsy Lager Date DME Agency Contacted: 04/09/23 Time DME Agency Contacted: [phone] Representative spoke with at DME Agency: Morrie Sheldon HH Arranged: RN, PT, OT [facility] Agency: Other - See comment International aid/development worker) Date HH Agency Contacted: 04/08/23 Time HH Agency Contacted: 1300 Representative spoke with at [facility] Agency: Rene Kocher  Social Drivers of Health (SDOH) Interventions SDOH Screenings   Food Insecurity: No Food Insecurity (04/04/2023)  Housing: Low Risk  (04/07/2023)  Transportation Needs: No Transportation Needs (04/04/2023)  Utilities: Not At Risk (04/04/2023)  Depression (PHQ2-9): Low Risk   (01/09/2019)  Social Connections: Patient Declined (04/04/2023)  Tobacco Use: Medium Risk (04/03/2023)     Readmission Risk Interventions    04/08/2023    1:02 PM 04/06/2023    3:38 PM 05/02/2022   11:19 AM  Readmission Risk Prevention Plan  Transportation Screening Complete Complete Complete  PCP or Specialist Appt within 3-5 Days Complete Complete   HRI or Home Care Consult Complete Complete   Social Work Consult for Recovery Care Planning/Counseling Complete Complete   Palliative Care Screening Complete Not Applicable   Medication Review Oceanographer) Complete Complete Complete  PCP or Specialist appointment within 3-5 days of discharge   Complete  HRI or Home Care Consult   Complete  SW Recovery Care/Counseling Consult   Complete  Palliative Care Screening   Not Applicable  Skilled Nursing Facility   Complete

## 2023-04-09 NOTE — Plan of Care (Signed)
  Problem: Education: Goal: Knowledge of General Education information will improve Description: Including pain rating scale, medication(s)/side effects and non-pharmacologic comfort measures Outcome: Progressing   Problem: Health Behavior/Discharge Planning: Goal: Ability to manage health-related needs will improve Outcome: Progressing   Problem: Clinical Measurements: Goal: Ability to maintain clinical measurements within normal limits will improve Outcome: Progressing Goal: Will remain free from infection Outcome: Progressing Goal: Diagnostic test results will improve Outcome: Progressing Goal: Respiratory complications will improve Outcome: Progressing Goal: Cardiovascular complication will be avoided Outcome: Progressing   Problem: Activity: Goal: Risk for activity intolerance will decrease Outcome: Progressing   Problem: Nutrition: Goal: Adequate nutrition will be maintained Outcome: Progressing   Problem: Coping: Goal: Level of anxiety will decrease Outcome: Progressing   Problem: Elimination: Goal: Will not experience complications related to bowel motility Outcome: Progressing Goal: Will not experience complications related to urinary retention Outcome: Progressing   Problem: Pain Managment: Goal: General experience of comfort will improve and/or be controlled Outcome: Progressing   Problem: Safety: Goal: Ability to remain free from injury will improve Outcome: Progressing   Problem: Skin Integrity: Goal: Risk for impaired skin integrity will decrease Outcome: Progressing   Problem: Urinary Elimination: Goal: Signs and symptoms of infection will decrease Outcome: Progressing   Problem: Clinical Measurements: Goal: Ability to avoid or minimize complications of infection will improve Outcome: Progressing   Problem: Skin Integrity: Goal: Skin integrity will improve Outcome: Progressing

## 2023-04-09 NOTE — Care Management Important Message (Signed)
Important Message  Patient Details IM Letter given. Name: Denise Kelly MRN: [id number] Date of Birth: Mar 17, 1941   Important Message Given:  Yes - Medicare IM     Caren Macadam 04/09/2023, 1:50 PM

## 2023-04-09 NOTE — Progress Notes (Signed)
Pt refused to be bathed as reported to me by nurse tech

## 2023-04-10 LAB — BASIC METABOLIC PANEL
Anion gap: 11 (ref 5–15)
BUN: 17 mg/dL (ref 8–23)
CO2: 28 mmol/L (ref 22–32)
Calcium: 8.2 mg/dL — ABNORMAL LOW (ref 8.9–10.3)
Chloride: 93 mmol/L — ABNORMAL LOW (ref 98–111)
Creatinine, Ser: 0.79 mg/dL (ref 0.44–1.00)
GFR, Estimated: >60 mL/min (ref >60)
Glucose, Bld: 127 mg/dL — ABNORMAL HIGH (ref 70–99)
Potassium: 3.5 mmol/L (ref 3.5–5.1)
Sodium: 132 mmol/L — ABNORMAL LOW (ref 135–145)

## 2023-04-10 LAB — CBC
HCT: 42.8 % (ref 36.0–46.0)
Hemoglobin: 13.5 g/dL (ref 12.0–15.0)
MCH: 28.4 pg (ref 26.0–34.0)
MCHC: 31.5 g/dL (ref 30.0–36.0)
MCV: 90.1 fL (ref 80.0–100.0)
Platelets: 178 10*3/uL (ref 150–400)
RBC: 4.75 MIL/uL (ref 3.87–5.11)
RDW: 13.2 % (ref 11.5–15.5)
WBC: 5.9 10*3/uL (ref 4.0–10.5)
nRBC: 0 % (ref 0.0–0.2)

## 2023-04-10 NOTE — Progress Notes (Signed)
Pt off floor for discharge via stretcher accompanied by [facility] transport.  Pt required max assist x 2 to pivot from chair to stretcher.  Pt wearing her shirt, and bathrobe and has her phone/wallet in her hand at the time this note is written.  2 bags of belongings were sent with patient and transport.  Pt on 2LNC at time of transport and home tank was sent with transport.

## 2023-04-10 NOTE — TOC Progression Note (Signed)
Transition of Care [facility]) - Progression Note    Patient Details  Name: Denise Kelly MRN: [id number] Date of Birth: 04-27-1940  Transition of Care [facility]) CM/SW Contact  Otelia Santee, LCSW Phone Number: 04/10/2023, 8:36 AM  Clinical Narrative:    Helene Kelp who shared they had no information of pt on their docket or the counties docket. Submitted new PTAR request. [employer] to provide O2 to pt at home once she arrives. [employer] has pt's caregiver information to call for follow up.    Expected Discharge Plan: Home w Home Health Services Barriers to Discharge: Barriers Resolved  Expected Discharge Plan and Services In-house Referral: Clinical Social Work Discharge Planning Services: NA Post Acute Care Choice: Home Health Living arrangements for the past 2 months: Independent Living Facility Expected Discharge Date: 04/09/23               DME Arranged: Oxygen DME Agency: Patsy Lager Date DME Agency Contacted: 04/09/23 Time DME Agency Contacted: [phone] Representative spoke with at DME Agency: Morrie Sheldon HH Arranged: RN, PT, OT [facility] Agency: Other - See comment International aid/development worker) Date HH Agency Contacted: 04/08/23 Time HH Agency Contacted: 1300 Representative spoke with at [facility] Agency: Rene Kocher   Social Determinants of Health (SDOH) Interventions SDOH Screenings   Food Insecurity: No Food Insecurity (04/04/2023)  Housing: Low Risk  (04/07/2023)  Transportation Needs: No Transportation Needs (04/04/2023)  Utilities: Not At Risk (04/04/2023)  Depression (PHQ2-9): Low Risk  (01/09/2019)  Social Connections: Patient Declined (04/04/2023)  Tobacco Use: Medium Risk (04/03/2023)    Readmission Risk Interventions    04/08/2023    1:02 PM 04/06/2023    3:38 PM 05/02/2022   11:19 AM  Readmission Risk Prevention Plan  Transportation Screening Complete Complete Complete  PCP or Specialist Appt within 3-5 Days Complete Complete   HRI or Home Care Consult Complete Complete   Social Work Consult for Recovery  Care Planning/Counseling Complete Complete   Palliative Care Screening Complete Not Applicable   Medication Review Oceanographer) Complete Complete Complete  PCP or Specialist appointment within 3-5 days of discharge   Complete  HRI or Home Care Consult   Complete  SW Recovery Care/Counseling Consult   Complete  Palliative Care Screening   Not Applicable  Skilled Nursing Facility   Complete

## 2023-04-10 NOTE — Progress Notes (Signed)
Patient takes out oxygen intermittently and oxygen drops to 87%. Educated and encouraged to wear her nasal canula and oxygen goes up to 93. Patient refused peri care this morning despite education and reorientation.

## 2023-04-14 ENCOUNTER — Telehealth: Payer: Self-pay | Admitting: Family Medicine

## 2023-04-17 ENCOUNTER — Other Ambulatory Visit: Payer: Self-pay

## 2023-04-17 ENCOUNTER — Encounter (HOSPITAL_COMMUNITY): Payer: Self-pay

## 2023-04-17 ENCOUNTER — Emergency Department (HOSPITAL_COMMUNITY)
Admission: EM | Admit: 2023-04-17 | Discharge: 2023-04-17 | Disposition: A | Discharge status: Home / Self Care | Payer: Medicare (Managed Care) | Attending: Emergency Medicine | Admitting: Emergency Medicine

## 2023-04-17 DIAGNOSIS — M7989 Other specified soft tissue disorders: Secondary | ICD-10-CM | POA: Diagnosis present

## 2023-04-17 DIAGNOSIS — Z7901 Long term (current) use of anticoagulants: Secondary | ICD-10-CM | POA: Insufficient documentation

## 2023-04-17 DIAGNOSIS — L03115 Cellulitis of right lower limb: Secondary | ICD-10-CM | POA: Insufficient documentation

## 2023-04-17 DIAGNOSIS — L03116 Cellulitis of left lower limb: Secondary | ICD-10-CM | POA: Diagnosis not present

## 2023-04-17 LAB — BASIC METABOLIC PANEL
Anion gap: 8 (ref 5–15)
BUN: 10 mg/dL (ref 8–23)
CO2: 31 mmol/L (ref 22–32)
Calcium: 8.4 mg/dL — ABNORMAL LOW (ref 8.9–10.3)
Chloride: 101 mmol/L (ref 98–111)
Creatinine, Ser: 0.6 mg/dL (ref 0.44–1.00)
GFR, Estimated: >60 mL/min (ref >60)
Glucose, Bld: 129 mg/dL — ABNORMAL HIGH (ref 70–99)
Potassium: 3.3 mmol/L — ABNORMAL LOW (ref 3.5–5.1)
Sodium: 140 mmol/L (ref 135–145)

## 2023-04-17 LAB — CBC WITH DIFFERENTIAL/PLATELET
Abs Immature Granulocytes: 0.02 10*3/uL (ref 0.00–0.07)
Basophils Absolute: 0 10*3/uL (ref 0.0–0.1)
Basophils Relative: 1 %
Eosinophils Absolute: 0.1 10*3/uL (ref 0.0–0.5)
Eosinophils Relative: 1 %
HCT: 40 % (ref 36.0–46.0)
Hemoglobin: 12.7 g/dL (ref 12.0–15.0)
Immature Granulocytes: 0 %
Lymphocytes Relative: 17 %
Lymphs Abs: 1.1 10*3/uL (ref 0.7–4.0)
MCH: 28.7 pg (ref 26.0–34.0)
MCHC: 31.8 g/dL (ref 30.0–36.0)
MCV: 90.5 fL (ref 80.0–100.0)
Monocytes Absolute: 0.6 10*3/uL (ref 0.1–1.0)
Monocytes Relative: 9 %
Neutro Abs: 4.5 10*3/uL (ref 1.7–7.7)
Neutrophils Relative %: 72 %
Platelets: 223 10*3/uL (ref 150–400)
RBC: 4.42 MIL/uL (ref 3.87–5.11)
RDW: 13.2 % (ref 11.5–15.5)
WBC: 6.3 10*3/uL (ref 4.0–10.5)
nRBC: 0 % (ref 0.0–0.2)

## 2023-04-17 MED ORDER — CEFADROXIL 500 MG PO CAPS: 500.0000 mg | ORAL_CAPSULE | Freq: Two times a day (BID) | ORAL | 0 refills | Status: DC

## 2023-04-17 MED ORDER — CEFADROXIL 500 MG PO CAPS
500.0000 mg | ORAL_CAPSULE | Freq: Two times a day (BID) | ORAL | Status: DC
  Administered 2023-04-17: 500 mg via ORAL
  Filled 2023-04-17: qty 1

## 2023-04-17 NOTE — ED Notes (Signed)
 [facility] called for transport.

## 2023-04-17 NOTE — ED Notes (Signed)
Pt states we can not touch her or put socks on her feet to ambulate. Pt refuses to ambulate Trisha Mangle PA aware.

## 2023-04-17 NOTE — ED Notes (Signed)
 Patient given sandwich.

## 2023-04-17 NOTE — Discharge Instructions (Addendum)
 Take antibiotic as directed.

## 2023-04-17 NOTE — ED Notes (Signed)
 Ptar called for transport

## 2023-04-17 NOTE — ED Triage Notes (Signed)
Patient BIB EMS from [facility] for leg pain x 2 days. Hx of cellulitis.

## 2023-04-17 NOTE — ED Provider Notes (Signed)
[employer] EMERGENCY DEPARTMENT AT [facility] Provider Note   CSN: [id number] Arrival date & time: 04/17/23  0759     History  Chief Complaint  Patient presents with   Leg Pain    Denise Kelly is a 83 y.o. female.  Patient complains of swelling in both of her legs.  Patient reports that she was in the hospital and was on antibiotics.  Patient feels like the swelling is returning.  Patient denies any fever or chills.  Patient reports she was on antibiotics after she left the hospital and legs were doing better.  The history is provided by the patient. No language interpreter was used.  Leg Pain Location:  Leg Leg location:  L lower leg and R lower leg Pain details:    Quality:  Aching   Radiates to:  Does not radiate   Progression:  Worsening Chronicity:  New      Home Medications Prior to Admission medications   Medication Sig Start Date End Date Taking? Authorizing Provider  acetaminophen (TYLENOL) 325 MG tablet Take 2 tablets (650 mg total) by mouth 2 (two) times daily. 05/24/22   Shalhoub, Deno Lunger, MD  apixaban (ELIQUIS) 5 MG TABS tablet Take 1 tablet (5 mg total) by mouth 2 (two) times daily. 04/08/23 05/09/23  Lanae Boast, MD  cyanocobalamin 1000 MCG tablet Take 1 tablet (1,000 mcg total) by mouth daily. 04/08/23 05/08/23  Lanae Boast, MD  gabapentin (NEURONTIN) 100 MG capsule Take 1 capsule (100 mg total) by mouth 3 (three) times daily. 04/08/23 05/08/23  Lanae Boast, MD  hydrOXYzine (ATARAX) 10 MG tablet Take 1 tablet (10 mg total) by mouth 3 (three) times daily as needed for itching. 04/08/23 05/08/23  Lanae Boast, MD  ondansetron (ZOFRAN) 4 MG tablet Take 1 tablet (4 mg total) by mouth every 8 (eight) hours as needed for nausea or vomiting. 04/08/23   Lanae Boast, MD  phenazopyridine (PYRIDIUM) 100 MG tablet Take 1 tablet (100 mg total) by mouth 3 (three) times daily as needed (dysurea). 04/08/23   Lanae Boast, MD  pramipexole (MIRAPEX) 1 MG tablet Take 1 tablet (1 mg  total) by mouth every evening. 04/08/23 05/08/23  Lanae Boast, MD  traZODone (DESYREL) 100 MG tablet Take 1 tablet (100 mg total) by mouth at bedtime as needed for sleep. 04/08/23 05/09/23  Lanae Boast, MD      Allergies    Sulfa antibiotics, Coreg [carvedilol], Motrin [ibuprofen], Toprol xl [metoprolol tartrate], Doxycycline hyclate, Flovent hfa [fluticasone], and Statins    Review of Systems   Review of Systems  All other systems reviewed and are negative.   Physical Exam Updated Vital Signs BP (!) 121/57 (BP Location: Right Leg)   Pulse 96   Temp 98.3 F (36.8 C) (Oral)   Resp 14   Ht 5\' 7"  (1.702 m)   Wt 99.6 kg   SpO2 96%   BMI 34.38 kg/m  Physical Exam Vitals and nursing note reviewed.  Constitutional:      Appearance: She is well-developed.  HENT:     Head: Normocephalic.  Cardiovascular:     Rate and Rhythm: Normal rate.  Pulmonary:     Effort: Pulmonary effort is normal.  Abdominal:     General: There is no distension.  Musculoskeletal:        General: Swelling and tenderness present.     Cervical back: Normal range of motion.     Comments: Swollen bilateral lower legs, dark, erythema,  Skin:  General: Skin is warm.  Neurological:     General: No focal deficit present.     Mental Status: She is alert and oriented to person, place, and time.     ED Results / Procedures / Treatments   Labs (all labs ordered are listed, but only abnormal results are displayed) Labs Reviewed  BASIC METABOLIC PANEL - Abnormal; Notable for the following components:      Result Value   Potassium 3.3 (*)    Glucose, Bld 129 (*)    Calcium 8.4 (*)    All other components within normal limits  CBC WITH DIFFERENTIAL/PLATELET    EKG None  Radiology No results found.  Procedures Procedures    Medications Ordered in ED Medications - No data to display  ED Course/ Medical Decision Making/ A&P                                 Medical Decision Making Patient complains  of swelling to both of her lower legs.  Patient has a history of cellulitis to both legs.  Amount and/or Complexity of Data Reviewed Labs: ordered. Decision-making details documented in ED Course.    Details: Labs ordered reviewed and interpreted.  Potassium is 3.3 patient has a normal white blood cell count  Risk Prescription drug management. Risk Details: Pt is able to ambulate.  Pt eating and drinking.  Pt given rx for antibiotics.             Final Clinical Impression(s) / ED Diagnoses Final diagnoses:  Cellulitis of right lower extremity  Cellulitis of left lower extremity    Rx / DC Orders ED Discharge Orders          Ordered    cefadroxil (DURICEF) 500 MG capsule  2 times daily        04/17/23 1328           An After Visit Summary was printed and given to the patient.    Osie Cheeks 04/17/23 1330    Linwood Dibbles, MD 04/19/23 [phone]

## 2023-04-21 ENCOUNTER — Emergency Department (HOSPITAL_COMMUNITY): Payer: Medicare (Managed Care)

## 2023-04-21 ENCOUNTER — Encounter (HOSPITAL_COMMUNITY): Payer: Self-pay | Admitting: Emergency Medicine

## 2023-04-21 ENCOUNTER — Other Ambulatory Visit: Payer: Self-pay

## 2023-04-21 ENCOUNTER — Inpatient Hospital Stay (HOSPITAL_COMMUNITY)
Admission: EM | Admit: 2023-04-21 | Discharge: 2023-04-28 | DRG: 603 | Disposition: A | Discharge status: Skilled Nursing Facility | Payer: Medicare (Managed Care) | Attending: Internal Medicine | Admitting: Internal Medicine

## 2023-04-21 DIAGNOSIS — Z882 Allergy status to sulfonamides status: Secondary | ICD-10-CM

## 2023-04-21 DIAGNOSIS — L0390 Cellulitis, unspecified: Principal | ICD-10-CM | POA: Diagnosis present

## 2023-04-21 DIAGNOSIS — Z96651 Presence of right artificial knee joint: Secondary | ICD-10-CM | POA: Diagnosis present

## 2023-04-21 DIAGNOSIS — F410 Panic disorder [episodic paroxysmal anxiety] without agoraphobia: Secondary | ICD-10-CM | POA: Diagnosis present

## 2023-04-21 DIAGNOSIS — E66811 Obesity, class 1: Secondary | ICD-10-CM

## 2023-04-21 DIAGNOSIS — L03116 Cellulitis of left lower limb: Secondary | ICD-10-CM | POA: Diagnosis present

## 2023-04-21 DIAGNOSIS — M1990 Unspecified osteoarthritis, unspecified site: Secondary | ICD-10-CM | POA: Diagnosis present

## 2023-04-21 DIAGNOSIS — I10 Essential (primary) hypertension: Secondary | ICD-10-CM | POA: Diagnosis not present

## 2023-04-21 DIAGNOSIS — Z6834 Body mass index (BMI) 34.0-34.9, adult: Secondary | ICD-10-CM

## 2023-04-21 DIAGNOSIS — I110 Hypertensive heart disease with heart failure: Secondary | ICD-10-CM | POA: Diagnosis present

## 2023-04-21 DIAGNOSIS — R531 Weakness: Secondary | ICD-10-CM | POA: Diagnosis not present

## 2023-04-21 DIAGNOSIS — I2699 Other pulmonary embolism without acute cor pulmonale: Secondary | ICD-10-CM | POA: Diagnosis present

## 2023-04-21 DIAGNOSIS — F1020 Alcohol dependence, uncomplicated: Secondary | ICD-10-CM | POA: Diagnosis present

## 2023-04-21 DIAGNOSIS — I5032 Chronic diastolic (congestive) heart failure: Secondary | ICD-10-CM | POA: Diagnosis not present

## 2023-04-21 DIAGNOSIS — Z87440 Personal history of urinary (tract) infections: Secondary | ICD-10-CM

## 2023-04-21 DIAGNOSIS — E669 Obesity, unspecified: Secondary | ICD-10-CM | POA: Diagnosis present

## 2023-04-21 DIAGNOSIS — I890 Lymphedema, not elsewhere classified: Secondary | ICD-10-CM | POA: Diagnosis present

## 2023-04-21 DIAGNOSIS — K219 Gastro-esophageal reflux disease without esophagitis: Secondary | ICD-10-CM | POA: Diagnosis present

## 2023-04-21 DIAGNOSIS — Z888 Allergy status to other drugs, medicaments and biological substances status: Secondary | ICD-10-CM

## 2023-04-21 DIAGNOSIS — L03119 Cellulitis of unspecified part of limb: Secondary | ICD-10-CM | POA: Diagnosis not present

## 2023-04-21 DIAGNOSIS — I878 Other specified disorders of veins: Secondary | ICD-10-CM | POA: Diagnosis present

## 2023-04-21 DIAGNOSIS — L03115 Cellulitis of right lower limb: Principal | ICD-10-CM | POA: Diagnosis present

## 2023-04-21 DIAGNOSIS — F32A Depression, unspecified: Secondary | ICD-10-CM | POA: Diagnosis present

## 2023-04-21 DIAGNOSIS — R262 Difficulty in walking, not elsewhere classified: Secondary | ICD-10-CM | POA: Diagnosis present

## 2023-04-21 DIAGNOSIS — Z886 Allergy status to analgesic agent status: Secondary | ICD-10-CM

## 2023-04-21 DIAGNOSIS — Z87891 Personal history of nicotine dependence: Secondary | ICD-10-CM

## 2023-04-21 DIAGNOSIS — Z881 Allergy status to other antibiotic agents status: Secondary | ICD-10-CM

## 2023-04-21 DIAGNOSIS — Z7901 Long term (current) use of anticoagulants: Secondary | ICD-10-CM

## 2023-04-21 DIAGNOSIS — Z79899 Other long term (current) drug therapy: Secondary | ICD-10-CM

## 2023-04-21 DIAGNOSIS — Z86711 Personal history of pulmonary embolism: Secondary | ICD-10-CM

## 2023-04-21 DIAGNOSIS — I2782 Chronic pulmonary embolism: Secondary | ICD-10-CM

## 2023-04-21 DIAGNOSIS — Z8249 Family history of ischemic heart disease and other diseases of the circulatory system: Secondary | ICD-10-CM

## 2023-04-21 DIAGNOSIS — R21 Rash and other nonspecific skin eruption: Secondary | ICD-10-CM | POA: Diagnosis present

## 2023-04-21 DIAGNOSIS — E876 Hypokalemia: Secondary | ICD-10-CM | POA: Diagnosis present

## 2023-04-21 LAB — CBC
HCT: 41.6 % (ref 36.0–46.0)
Hemoglobin: 13.7 g/dL (ref 12.0–15.0)
MCH: 28.8 pg (ref 26.0–34.0)
MCHC: 32.9 g/dL (ref 30.0–36.0)
MCV: 87.4 fL (ref 80.0–100.0)
Platelets: 262 10*3/uL (ref 150–400)
RBC: 4.76 MIL/uL (ref 3.87–5.11)
RDW: 13.2 % (ref 11.5–15.5)
WBC: 7.8 10*3/uL (ref 4.0–10.5)
nRBC: 0 % (ref 0.0–0.2)

## 2023-04-21 LAB — HEPATIC FUNCTION PANEL
ALT: 17 U/L (ref 0–44)
AST: 29 U/L (ref 15–41)
Albumin: 2.9 g/dL — ABNORMAL LOW (ref 3.5–5.0)
Alkaline Phosphatase: 86 U/L (ref 38–126)
Bilirubin, Direct: 0.2 mg/dL (ref 0.0–0.2)
Indirect Bilirubin: 0.5 mg/dL (ref 0.3–0.9)
Total Bilirubin: 0.7 mg/dL (ref 0.0–1.2)
Total Protein: 6.5 g/dL (ref 6.5–8.1)

## 2023-04-21 LAB — BASIC METABOLIC PANEL
Anion gap: 12 (ref 5–15)
BUN: 10 mg/dL (ref 8–23)
CO2: 27 mmol/L (ref 22–32)
Calcium: 8.7 mg/dL — ABNORMAL LOW (ref 8.9–10.3)
Chloride: 100 mmol/L (ref 98–111)
Creatinine, Ser: 0.83 mg/dL (ref 0.44–1.00)
GFR, Estimated: >60 mL/min (ref >60)
Glucose, Bld: 122 mg/dL — ABNORMAL HIGH (ref 70–99)
Potassium: 3.7 mmol/L (ref 3.5–5.1)
Sodium: 139 mmol/L (ref 135–145)

## 2023-04-21 LAB — RESP PANEL BY RT-PCR (RSV, FLU A&B, COVID)  RVPGX2
Influenza A by PCR: NEGATIVE
Influenza B by PCR: NEGATIVE
Resp Syncytial Virus by PCR: NEGATIVE
SARS Coronavirus 2 by RT PCR: NEGATIVE

## 2023-04-21 MED ORDER — FUROSEMIDE 40 MG PO TABS
40.0000 mg | ORAL_TABLET | Freq: Every day | ORAL | Status: DC
  Administered 2023-04-22 – 2023-04-28 (×7): 40 mg via ORAL
  Filled 2023-04-21 (×7): qty 1

## 2023-04-21 MED ORDER — VANCOMYCIN HCL IN DEXTROSE 1-5 GM/200ML-% IV SOLN
1000.0000 mg | Freq: Once | INTRAVENOUS | Status: AC
  Administered 2023-04-21: 1000 mg via INTRAVENOUS
  Filled 2023-04-21: qty 200

## 2023-04-21 MED ORDER — SODIUM CHLORIDE 0.9 % IV SOLN
1.0000 g | INTRAVENOUS | Status: DC
  Administered 2023-04-21 – 2023-04-28 (×7): 1 g via INTRAVENOUS
  Filled 2023-04-21 (×6): qty 10

## 2023-04-21 MED ORDER — HYDROXYZINE HCL 10 MG PO TABS
10.0000 mg | ORAL_TABLET | Freq: Three times a day (TID) | ORAL | Status: DC | PRN
  Administered 2023-04-23 – 2023-04-28 (×5): 10 mg via ORAL
  Filled 2023-04-21 (×5): qty 1

## 2023-04-21 MED ORDER — PHENAZOPYRIDINE HCL 100 MG PO TABS: 100.0000 mg | ORAL_TABLET | Freq: Three times a day (TID) | ORAL | Status: DC | PRN

## 2023-04-21 MED ORDER — ACETAMINOPHEN 650 MG RE SUPP: 650.0000 mg | Freq: Four times a day (QID) | RECTAL | Status: DC | PRN

## 2023-04-21 MED ORDER — ONDANSETRON HCL 4 MG PO TABS
4.0000 mg | ORAL_TABLET | Freq: Four times a day (QID) | ORAL | Status: DC | PRN
  Administered 2023-04-23 – 2023-04-27 (×3): 4 mg via ORAL
  Filled 2023-04-21 (×3): qty 1

## 2023-04-21 MED ORDER — PRAMIPEXOLE DIHYDROCHLORIDE 0.25 MG PO TABS
1.0000 mg | ORAL_TABLET | Freq: Every evening | ORAL | Status: DC
  Administered 2023-04-22 – 2023-04-27 (×6): 1 mg via ORAL
  Filled 2023-04-21 (×6): qty 4

## 2023-04-21 MED ORDER — APIXABAN 5 MG PO TABS
5.0000 mg | ORAL_TABLET | Freq: Two times a day (BID) | ORAL | Status: DC
  Administered 2023-04-21 – 2023-04-28 (×14): 5 mg via ORAL
  Filled 2023-04-21 (×14): qty 1

## 2023-04-21 MED ORDER — TRAZODONE HCL 100 MG PO TABS
100.0000 mg | ORAL_TABLET | Freq: Every evening | ORAL | Status: DC | PRN
  Administered 2023-04-22 – 2023-04-27 (×5): 100 mg via ORAL
  Filled 2023-04-21 (×5): qty 1

## 2023-04-21 MED ORDER — ONDANSETRON HCL 4 MG/2ML IJ SOLN
4.0000 mg | Freq: Four times a day (QID) | INTRAMUSCULAR | Status: DC | PRN
  Administered 2023-04-24 – 2023-04-26 (×2): 4 mg via INTRAVENOUS
  Filled 2023-04-21 (×2): qty 2

## 2023-04-21 MED ORDER — SODIUM CHLORIDE 0.9 % IV SOLN
2.0000 g | Freq: Once | INTRAVENOUS | Status: AC
  Administered 2023-04-21: 2 g via INTRAVENOUS
  Filled 2023-04-21: qty 12.5

## 2023-04-21 MED ORDER — TRAMADOL HCL 50 MG PO TABS
50.0000 mg | ORAL_TABLET | Freq: Once | ORAL | Status: AC
  Administered 2023-04-21: 50 mg via ORAL
  Filled 2023-04-21: qty 1

## 2023-04-21 MED ORDER — ACETAMINOPHEN 325 MG PO TABS
650.0000 mg | ORAL_TABLET | Freq: Four times a day (QID) | ORAL | Status: DC | PRN
  Administered 2023-04-22 – 2023-04-23 (×3): 650 mg via ORAL
  Filled 2023-04-21 (×3): qty 2

## 2023-04-21 MED ORDER — PANTOPRAZOLE SODIUM 40 MG PO TBEC
40.0000 mg | DELAYED_RELEASE_TABLET | Freq: Every day | ORAL | Status: DC
  Administered 2023-04-22 – 2023-04-28 (×7): 40 mg via ORAL
  Filled 2023-04-21 (×7): qty 1

## 2023-04-21 NOTE — H&P (Signed)
 History and Physical    Patient: Denise Kelly FMW:[id number] DOB: 1940/09/30 DOA: 04/21/2023 DOS: the patient was seen and examined on 04/21/2023 PCP: Pcp, No  Patient coming from: ALF/ILF  Chief Complaint:  Chief Complaint  Patient presents with   Weakness   HPI: ALAYSSA Kelly is a 83 y.o. female with medical history significant of alcohol  abuse, anxiety, depression, GERD,  and hypertension who presented with difficulty walking.   Patient reports having worsening lower extremity rash for the last 4 weeks, associated with moderate pain, worse to touch, with no improving factors. No associated fever or chills. Denies dyspnea or chest pain.  She is not sure about  any change in her chronic lower extremity edema, because she is not able to see her legs due to her poor mobility.  Apparently she has been using a walker for ambulation, but lately she has more and more difficulty with ambulation.   Today she was note able to get out of her bed,  and EMS was called. Her lower extremity rash was noted to be severe and decision was made to transport her to the ED.   Recent hospitalization 01/17 to 04/08/23 for urinary tract infection and bilateral lower extremity cellulitis.  She was discharge on cefadroxil  for 4 more days.   Review of Systems: As mentioned in the history of present illness. All other systems reviewed and are negative. Past Medical History:  Diagnosis Date   Alcohol  abuse    ETOH and xanax    Anxiety    Panic attacks    Arthritis    Cancer (HCC)    skin- basal , squamous , melonoma- right hand   Cellulitis    right leg   Complication of anesthesia 2009   felt drunk for a week after- AVM- both times- felt drunk   Depression    Encephalopathy    GERD (gastroesophageal reflux disease)    HOH (hard of hearing)    HOH (hard of hearing)    Hypertension    not on mediacations   Palpitations    PONV (postoperative nausea and vomiting)    Spondylolisthesis of  lumbosacral region    UTI (urinary tract infection)    frequently   Past Surgical History:  Procedure Laterality Date   BIOPSY  01/24/2018   Procedure: BIOPSY;  Surgeon: Saintclair Jasper, MD;  Location: [facility] ENDOSCOPY;  Service: Gastroenterology;;   BREAST SURGERY Right    2 breast biopsies   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   COLONOSCOPY  2011   polyps   ESOPHAGOGASTRODUODENOSCOPY (EGD) WITH PROPOFOL  N/A 01/24/2018   Procedure: ESOPHAGOGASTRODUODENOSCOPY (EGD) WITH PROPOFOL ;  Surgeon: Saintclair Jasper, MD;  Location: [facility] ENDOSCOPY;  Service: Gastroenterology;  Laterality: N/A;   EYE SURGERY Bilateral    cataracts   INTRAMEDULLARY (IM) NAIL INTERTROCHANTERIC Left 11/29/2015   Procedure: LEFT HIP   NAIL;  Surgeon: Evalene JONETTA Chancy, MD;  Location: [facility] OR;  Service: Orthopedics;  Laterality: Left;   JOINT REPLACEMENT Left 09/2009   knee   LUMBAR FUSION  07/17/2016   Lumbar four-five Posterior lumbar interbody fusion (N/A)   TONSILLECTOMY     TOTAL KNEE ARTHROPLASTY Right 01/02/2014   Procedure: TOTAL KNEE ARTHROPLASTY;  Surgeon: Marcey Raman, MD;  Location: [facility] OR;  Service: Orthopedics;  Laterality: Right;   TUBAL LIGATION     Social History:  reports that she quit smoking about 31 years ago. She started smoking about 51 years ago. She has a 20  pack-year smoking history. She has never used smokeless tobacco. She reports that she does not drink alcohol  and does not use drugs.  Allergies  Allergen Reactions   Sulfa Antibiotics Hives, Swelling and Other (See Comments)    Facial/eye swelling    Coreg [Carvedilol] Other (See Comments)    Memory loss   Motrin [Ibuprofen] Palpitations   Toprol  Xl [Metoprolol  Tartrate] Cough   Doxycycline  Hyclate Other (See Comments)    HEARTBURN   Flovent Hfa [Fluticasone] Itching   Statins Other (See Comments)    MENTAL STATUS CHANGE    Family History  Problem Relation Age of Onset   Hypertension Other    Cancer Mother    Cancer Father      Prior to Admission medications   Medication Sig Start Date End Date Taking? Authorizing Provider  acetaminophen  (TYLENOL ) 650 MG CR tablet Take 1,300 mg by mouth every 8 (eight) hours as needed for pain.   Yes [provider]  apixaban  (ELIQUIS ) 5 MG TABS tablet Take 1 tablet (5 mg total) by mouth 2 (two) times daily. 04/08/23 05/09/23 Yes Christobal Guadalajara, MD  cefadroxil  (DURICEF) 500 MG capsule Take 1 capsule (500 mg total) by mouth 2 (two) times daily. 04/17/23  Yes Sofia, Leslie K, PA-C  gabapentin  (NEURONTIN ) 100 MG capsule Take 1 capsule (100 mg total) by mouth 3 (three) times daily. 04/08/23 05/08/23 Yes Christobal Guadalajara, MD  hydrOXYzine  (ATARAX ) 10 MG tablet Take 1 tablet (10 mg total) by mouth 3 (three) times daily as needed for itching. 04/08/23 05/08/23 Yes Christobal Guadalajara, MD  ondansetron  (ZOFRAN ) 4 MG tablet Take 1 tablet (4 mg total) by mouth every 8 (eight) hours as needed for nausea or vomiting. 04/08/23  Yes Kc, Guadalajara, MD  phenazopyridine  (PYRIDIUM ) 100 MG tablet Take 1 tablet (100 mg total) by mouth 3 (three) times daily as needed (dysurea). 04/08/23  Yes Christobal Guadalajara, MD  pramipexole  (MIRAPEX ) 1 MG tablet Take 1 tablet (1 mg total) by mouth every evening. 04/08/23 05/08/23 Yes Kc, Guadalajara, MD  traZODone  (DESYREL ) 100 MG tablet Take 1 tablet (100 mg total) by mouth at bedtime as needed for sleep. 04/08/23 05/09/23 Yes Christobal Guadalajara, MD  cyanocobalamin  1000 MCG tablet Take 1 tablet (1,000 mcg total) by mouth daily. 04/08/23 05/08/23  Christobal Guadalajara, MD    Physical Exam: Vitals:   04/21/23 1155 04/21/23 1217 04/21/23 1247  BP: 120/66  137/71  Pulse: (!) 101  (!) 109  Resp: 20  19  Temp: (!) 97.5 F (36.4 C)  (!) 97.2 F (36.2 C)  TempSrc: Oral  Oral  SpO2: 99%  99%  Weight:  99 kg    Neurology awake and alert ENT with mild pallor Cardiovascular with S1 and S2 present and regular with no gallops, rubs or murmurs Respiratory with no rales or wheezing, no rhonchi Abdomen with no distention   Positive lower extremity edema +++, non pitting, positive local erythematous rash with dry purulent drainage and dry crusts.  Tender to touch, no crepitus and no increased local temperature.        Data Reviewed:   Na 139, K 3,7 Cl 100, bicarbonate 27, glucose 122, bun 10 cr 0,83  Wbc 63m,8 hgb 13,7 plt 262  Sars covid 19 negative Influenza negative   Chest radiograph with hypoinflation with cardiomegaly, with no effusions or infiltrates.  Pelvic radiograph with no acute fracture   EKG 105 bpm, left axis deviation, qtc 519, right bundle branch block, sinus rhythm with no significant ST  segment or T wave changes.   Assessment and Plan: Cellulitis of bilateral lower extremities Patient with recurrent lower extremity cellulitis.  She does have chronic lymphedema.   Low pre test probability for staph infection, she likely has peripheral vascular disease as well affecting local skin changes.   Plan to continue antibiotic therapy with ceftriaxone .  Follow up cell count, temperature curve and cultures.  Consult wound care.   Chronic diastolic CHF (congestive heart failure) (HCC) Patient has significant lower extremity edema, likely lymphedema.  No other signs that may indicate acute decompensation of heart failure.   Plan to continue oral diuretic therapy  Check new echocardiogram.   Essential hypertension Continue blood pressure monitoring.  She is not on antihypertensive medications.   GERD without esophagitis Continue with pantoprazole .   Obesity, unspecified Calculated BMI is 34,1   Alcohol  dependence (HCC) No signs of acute withdrawal.   Pulmonary embolism (HCC) History of pulmonary embolism, continue anticoagulation with apixaban .       Advance Care Planning:   Code Status: Full Code   Consults: none   Family Communication: no family at the bedside   Severity of Illness: The appropriate patient status for this patient is OBSERVATION. Observation status  is judged to be reasonable and necessary in order to provide the required intensity of service to ensure the patient's safety. The patient's presenting symptoms, physical exam findings, and initial radiographic and laboratory data in the context of their medical condition is felt to place them at decreased risk for further clinical deterioration. Furthermore, it is anticipated that the patient will be medically stable for discharge from the hospital within 2 midnights of admission.   Author: Elidia Toribio Furnace, MD 04/21/2023 2:27 PM  For on call review [email or web address].

## 2023-04-21 NOTE — Assessment & Plan Note (Addendum)
Patient has significant lower extremity edema, likely lymphedema.  No other signs that may indicate acute decompensation of heart failure.   Plan to continue oral diuretic therapy  Check new echocardiogram.

## 2023-04-21 NOTE — Assessment & Plan Note (Signed)
Calculated BMI is 34,1.

## 2023-04-21 NOTE — Assessment & Plan Note (Signed)
 Continue with pantoprazole/.

## 2023-04-21 NOTE — ED Triage Notes (Signed)
 Presents from [facility] via EMS for general weakness and fall (sat down from side of bed and could not get up, denies injury) and concern for UTI (similar behavior as times in past).  EMS VS: 130/80, 120HR, 88% on RA, 92% on 2L (unknown baseline, has a O2 condenser but was not wearing when they arrived. A&Ox4  No meds en route.

## 2023-04-21 NOTE — Assessment & Plan Note (Signed)
Continue blood pressure monitoring.  She is not on antihypertensive medications.

## 2023-04-21 NOTE — Assessment & Plan Note (Addendum)
 Patient with recurrent lower extremity cellulitis.  She does have chronic lymphedema.   Low pre test probability for staph infection, she likely has peripheral vascular disease as well affecting local skin changes.   Plan to continue antibiotic therapy with ceftriaxone .  Follow up cell count, temperature curve and cultures.  Consult wound care.

## 2023-04-21 NOTE — Assessment & Plan Note (Signed)
No signs of acute withdrawal.

## 2023-04-21 NOTE — Assessment & Plan Note (Signed)
History of pulmonary embolism, continue anticoagulation with apixaban.

## 2023-04-21 NOTE — ED Provider Notes (Signed)
 [place] EMERGENCY DEPARTMENT AT [facility] Provider Note   CSN: [id number] Arrival date & time: 04/21/23  1141     History  Chief Complaint  Patient presents with   Weakness    Denise Kelly is a 83 y.o. female.  Patient is here with generalized weakness, ongoing redness and swelling to the legs.  She lives at independent living facility.  She called for them to help as she was too weak to get out of bed.  She denies falling.  EMS was called and brought her here for evaluation.  She states that she is mostly been dealing with pain in her legs she has ongoing redness.  She states that she has not been on antibiotics.  She does have a history of alcohol  abuse anxiety.  She denies any weakness numbness tingling otherwise.  She is on Eliquis  for blood clots.  She states that she has not missed any doses.  The history is provided by the patient.       Home Medications Prior to Admission medications   Medication Sig Start Date End Date Taking? Authorizing Provider  acetaminophen  (TYLENOL ) 650 MG CR tablet Take 1,300 mg by mouth every 8 (eight) hours as needed for pain.   Yes [provider]  apixaban  (ELIQUIS ) 5 MG TABS tablet Take 1 tablet (5 mg total) by mouth 2 (two) times daily. 04/08/23 05/09/23 Yes Christobal Guadalajara, MD  cefadroxil  (DURICEF) 500 MG capsule Take 1 capsule (500 mg total) by mouth 2 (two) times daily. 04/17/23  Yes Sofia, Leslie K, PA-C  gabapentin  (NEURONTIN ) 100 MG capsule Take 1 capsule (100 mg total) by mouth 3 (three) times daily. 04/08/23 05/08/23 Yes Christobal Guadalajara, MD  hydrOXYzine  (ATARAX ) 10 MG tablet Take 1 tablet (10 mg total) by mouth 3 (three) times daily as needed for itching. 04/08/23 05/08/23 Yes Christobal Guadalajara, MD  ondansetron  (ZOFRAN ) 4 MG tablet Take 1 tablet (4 mg total) by mouth every 8 (eight) hours as needed for nausea or vomiting. 04/08/23  Yes Kc, Guadalajara, MD  phenazopyridine  (PYRIDIUM ) 100 MG tablet Take 1 tablet (100 mg total) by mouth 3  (three) times daily as needed (dysurea). 04/08/23  Yes Christobal Guadalajara, MD  pramipexole  (MIRAPEX ) 1 MG tablet Take 1 tablet (1 mg total) by mouth every evening. 04/08/23 05/08/23 Yes Kc, Guadalajara, MD  traZODone  (DESYREL ) 100 MG tablet Take 1 tablet (100 mg total) by mouth at bedtime as needed for sleep. 04/08/23 05/09/23 Yes Christobal Guadalajara, MD  cyanocobalamin  1000 MCG tablet Take 1 tablet (1,000 mcg total) by mouth daily. 04/08/23 05/08/23  Christobal Guadalajara, MD      Allergies    Sulfa antibiotics, Coreg [carvedilol], Motrin [ibuprofen], Toprol  xl [metoprolol  tartrate], Doxycycline  hyclate, Flovent hfa [fluticasone], and Statins    Review of Systems   Review of Systems  Physical Exam Updated Vital Signs BP 137/71 (BP Location: Right Arm)   Pulse (!) 109   Temp (!) 97.2 F (36.2 C) (Oral)   Resp 19   Wt 99 kg   SpO2 99%   BMI 34.18 kg/m  Physical Exam Vitals and nursing note reviewed.  Constitutional:      General: She is not in acute distress.    Appearance: She is well-developed. She is not ill-appearing.  HENT:     Head: Normocephalic and atraumatic.     Nose: Nose normal.     Mouth/Throat:     Mouth: Mucous membranes are moist.  Eyes:     Extraocular  Movements: Extraocular movements intact.     Conjunctiva/sclera: Conjunctivae normal.     Pupils: Pupils are equal, round, and reactive to light.  Cardiovascular:     Rate and Rhythm: Normal rate and regular rhythm.     Pulses: Normal pulses.     Heart sounds: Normal heart sounds. No murmur heard.    Comments: Doppler pulses to bilateral PT DP Pulmonary:     Effort: Pulmonary effort is normal. No respiratory distress.     Breath sounds: Normal breath sounds.  Abdominal:     Palpations: Abdomen is soft.     Tenderness: There is no abdominal tenderness.  Musculoskeletal:        General: No swelling.     Cervical back: Normal range of motion and neck supple.  Skin:    General: Skin is warm and dry.     Capillary Refill: Capillary refill takes  less than 2 seconds.     Findings: Erythema present.     Comments: Bilateral lower extremities are erythematous cellulitic appearing with dried scabs over the top of the ankles bilaterally  Neurological:     General: No focal deficit present.     Mental Status: She is alert.  Psychiatric:        Mood and Affect: Mood normal.     ED Results / Procedures / Treatments   Labs (all labs ordered are listed, but only abnormal results are displayed) Labs Reviewed  BASIC METABOLIC PANEL - Abnormal; Notable for the following components:      Result Value   Glucose, Bld 122 (*)    Calcium  8.7 (*)    All other components within normal limits  HEPATIC FUNCTION PANEL - Abnormal; Notable for the following components:   Albumin  2.9 (*)    All other components within normal limits  RESP PANEL BY RT-PCR (RSV, FLU A&B, COVID)  RVPGX2  CBC  URINALYSIS, ROUTINE W REFLEX MICROSCOPIC  CBG MONITORING, ED    EKG EKG Interpretation Date/Time:  Tuesday April 21 2023 13:25:38 EST Ventricular Rate:  105 PR Interval:  179 QRS Duration:  152 QT Interval:  392 QTC Calculation: 519 R Axis:   -35  Text Interpretation: Sinus tachycardia Right bundle branch block Confirmed by Ruthe Cornet [phone]) on 04/21/2023 2:27:04 PM  Radiology No results found.  Procedures Procedures    Medications Ordered in ED Medications  vancomycin  (VANCOCIN ) IVPB 1000 mg/200 mL premix (has no administration in time range)  ceFEPIme  (MAXIPIME ) 2 g in sodium chloride  0.9 % 100 mL IVPB (has no administration in time range)    ED Course/ Medical Decision Making/ A&P                                 Medical Decision Making Amount and/or Complexity of Data Reviewed Labs: ordered. Radiology: ordered.  Risk Prescription drug management. Decision regarding hospitalization.   Denise Kelly is here for worsening redness and swelling to her lower legs.  Differential diagnosis likely ongoing cellulitis.  Seems like  she was seen about 5 days ago for the same she supposed be on antibiotics but not sure she has been taking them.  She is overall not a great historian.  She has a history of alcohol  use anxiety skin cancer.  Ultimately I think that she has ongoing bad cellulitis to her legs and would benefit from IV antibiotics admission and figure out some social support.  She is got  Doppler pulses on exam.  She is on Eliquis  for blood clots and I have a lower suspicion for clots at this time.  Lab work was obtained that showed no significant leukocytosis or anemia or electrolyte abnormality.  I do not think she is septic but I think she benefit from observation for IV antibiotics wound care social support.  Sounds like she is having a lot of pain in her feet making it difficult to get around as well.  Otherwise it does not sound like she is falling.  I do not think there is any need for head or neck CT.  She is not having any pain elsewhere.  COVID flu and RSV testing are unremarkable.  Overall admitted to medicine for further care.  This chart was dictated using voice recognition software.  Despite best efforts to proofread,  errors can occur which can change the documentation meaning.         Final Clinical Impression(s) / ED Diagnoses Final diagnoses:  Cellulitis, unspecified cellulitis site  Weakness    Rx / DC Orders ED Discharge Orders     None         Ruthe Cornet, DO 04/21/23 1437

## 2023-04-22 ENCOUNTER — Observation Stay (HOSPITAL_COMMUNITY): Payer: Medicare (Managed Care)

## 2023-04-22 DIAGNOSIS — F410 Panic disorder [episodic paroxysmal anxiety] without agoraphobia: Secondary | ICD-10-CM | POA: Diagnosis present

## 2023-04-22 DIAGNOSIS — I5032 Chronic diastolic (congestive) heart failure: Secondary | ICD-10-CM | POA: Diagnosis not present

## 2023-04-22 DIAGNOSIS — L03116 Cellulitis of left lower limb: Secondary | ICD-10-CM | POA: Diagnosis not present

## 2023-04-22 DIAGNOSIS — L0390 Cellulitis, unspecified: Secondary | ICD-10-CM | POA: Diagnosis not present

## 2023-04-22 DIAGNOSIS — R21 Rash and other nonspecific skin eruption: Secondary | ICD-10-CM | POA: Diagnosis present

## 2023-04-22 DIAGNOSIS — I110 Hypertensive heart disease with heart failure: Secondary | ICD-10-CM | POA: Diagnosis present

## 2023-04-22 DIAGNOSIS — Z79899 Other long term (current) drug therapy: Secondary | ICD-10-CM | POA: Diagnosis not present

## 2023-04-22 DIAGNOSIS — M1990 Unspecified osteoarthritis, unspecified site: Secondary | ICD-10-CM | POA: Diagnosis present

## 2023-04-22 DIAGNOSIS — Z86711 Personal history of pulmonary embolism: Secondary | ICD-10-CM | POA: Diagnosis not present

## 2023-04-22 DIAGNOSIS — E876 Hypokalemia: Secondary | ICD-10-CM | POA: Diagnosis not present

## 2023-04-22 DIAGNOSIS — I878 Other specified disorders of veins: Secondary | ICD-10-CM | POA: Diagnosis present

## 2023-04-22 DIAGNOSIS — Z87891 Personal history of nicotine dependence: Secondary | ICD-10-CM | POA: Diagnosis not present

## 2023-04-22 DIAGNOSIS — F1020 Alcohol dependence, uncomplicated: Secondary | ICD-10-CM | POA: Diagnosis present

## 2023-04-22 DIAGNOSIS — Z8249 Family history of ischemic heart disease and other diseases of the circulatory system: Secondary | ICD-10-CM | POA: Diagnosis not present

## 2023-04-22 DIAGNOSIS — Z7901 Long term (current) use of anticoagulants: Secondary | ICD-10-CM | POA: Diagnosis not present

## 2023-04-22 DIAGNOSIS — I890 Lymphedema, not elsewhere classified: Secondary | ICD-10-CM | POA: Diagnosis present

## 2023-04-22 DIAGNOSIS — R531 Weakness: Secondary | ICD-10-CM | POA: Diagnosis present

## 2023-04-22 DIAGNOSIS — L03119 Cellulitis of unspecified part of limb: Secondary | ICD-10-CM | POA: Diagnosis not present

## 2023-04-22 DIAGNOSIS — I10 Essential (primary) hypertension: Secondary | ICD-10-CM | POA: Diagnosis not present

## 2023-04-22 DIAGNOSIS — Z886 Allergy status to analgesic agent status: Secondary | ICD-10-CM | POA: Diagnosis not present

## 2023-04-22 DIAGNOSIS — F32A Depression, unspecified: Secondary | ICD-10-CM | POA: Diagnosis present

## 2023-04-22 DIAGNOSIS — Z96651 Presence of right artificial knee joint: Secondary | ICD-10-CM | POA: Diagnosis present

## 2023-04-22 DIAGNOSIS — R262 Difficulty in walking, not elsewhere classified: Secondary | ICD-10-CM | POA: Diagnosis present

## 2023-04-22 DIAGNOSIS — K219 Gastro-esophageal reflux disease without esophagitis: Secondary | ICD-10-CM | POA: Diagnosis not present

## 2023-04-22 DIAGNOSIS — Z87440 Personal history of urinary (tract) infections: Secondary | ICD-10-CM | POA: Diagnosis not present

## 2023-04-22 DIAGNOSIS — Z6834 Body mass index (BMI) 34.0-34.9, adult: Secondary | ICD-10-CM | POA: Diagnosis not present

## 2023-04-22 DIAGNOSIS — F1090 Alcohol use, unspecified, uncomplicated: Secondary | ICD-10-CM | POA: Diagnosis not present

## 2023-04-22 DIAGNOSIS — E669 Obesity, unspecified: Secondary | ICD-10-CM | POA: Diagnosis present

## 2023-04-22 DIAGNOSIS — L03115 Cellulitis of right lower limb: Secondary | ICD-10-CM | POA: Diagnosis not present

## 2023-04-22 LAB — BASIC METABOLIC PANEL
Anion gap: 7 (ref 5–15)
BUN: 10 mg/dL (ref 8–23)
CO2: 31 mmol/L (ref 22–32)
Calcium: 8.2 mg/dL — ABNORMAL LOW (ref 8.9–10.3)
Chloride: 103 mmol/L (ref 98–111)
Creatinine, Ser: 0.99 mg/dL (ref 0.44–1.00)
GFR, Estimated: 57 mL/min — ABNORMAL LOW (ref >60)
Glucose, Bld: 111 mg/dL — ABNORMAL HIGH (ref 70–99)
Potassium: 3.4 mmol/L — ABNORMAL LOW (ref 3.5–5.1)
Sodium: 141 mmol/L (ref 135–145)

## 2023-04-22 LAB — CBC
HCT: 39.2 % (ref 36.0–46.0)
Hemoglobin: 12.7 g/dL (ref 12.0–15.0)
MCH: 28.8 pg (ref 26.0–34.0)
MCHC: 32.4 g/dL (ref 30.0–36.0)
MCV: 88.9 fL (ref 80.0–100.0)
Platelets: 222 10*3/uL (ref 150–400)
RBC: 4.41 MIL/uL (ref 3.87–5.11)
RDW: 13.4 % (ref 11.5–15.5)
WBC: 5.6 10*3/uL (ref 4.0–10.5)
nRBC: 0 % (ref 0.0–0.2)

## 2023-04-22 LAB — ECHOCARDIOGRAM COMPLETE
AR max vel: 2.15 cm2
AV Peak grad: 10 mm[Hg]
Ao pk vel: 1.58 m/s
Area-P 1/2: 4.41 cm2
Height: 67.008 in
S' Lateral: 2.6 cm
Weight: 3492.09 [oz_av]

## 2023-04-22 MED ORDER — TRAMADOL HCL 50 MG PO TABS
50.0000 mg | ORAL_TABLET | Freq: Once | ORAL | Status: AC
  Administered 2023-04-22: 50 mg via ORAL
  Filled 2023-04-22 (×2): qty 1

## 2023-04-22 NOTE — TOC Initial Note (Signed)
 Transition of Care (TOC) - Initial/Assessment Note   Spoke to patient's Health Care Agent [name] [phone] . Patient from [facility].   Discussed PT recommendations for SNF for short term rehab . Hope in agreement. Patient has gone to SNF before. Explained SW will fax out SNF referral and provide Hope bed offers once received with medicare.gov ratings.   Patient has been to [facility] in the past . Hope does not want her to go [facility].     Patient Details  Name: Denise Kelly MRN: [id number] Date of Birth: February 26, 1941  Transition of Care [facility]) CM/SW Contact:    Stephane Powell Jansky, RN Phone Number: 04/22/2023, 3:22 PM  Clinical Narrative:                   Expected Discharge Plan: Skilled Nursing Facility Barriers to Discharge: Continued Medical Work up   Patient Goals and CMS Choice Patient states their goals for this hospitalization and ongoing recovery are:: rehab          Expected Discharge Plan and Services In-house Referral: Clinical Social Work Discharge Planning Services: NA   Living arrangements for the past 2 months: Independent Living Facility                 DME Arranged: N/A DME Agency: NA       HH Arranged: NA HH Agency: NA        Prior Living Arrangements/Services Living arrangements for the past 2 months: Independent Living Facility Lives with:: Self Patient language and need for interpreter reviewed:: Yes        Need for Family Participation in Patient Care:  (see notes)   Current home services: DME Criminal Activity/Legal Involvement Pertinent to Current Situation/Hospitalization: No - Comment as needed  Activities of Daily Living   ADL Screening (condition at time of admission) Independently performs ADLs?: No Does the patient have a NEW difficulty with bathing/dressing/toileting/self-feeding that is expected to last >3 days?: No Does the patient have a NEW difficulty with getting in/out of bed,  walking, or climbing stairs that is expected to last >3 days?: No Does the patient have a NEW difficulty with communication that is expected to last >3 days?: No Is the patient deaf or have difficulty hearing?: Yes Does the patient have difficulty seeing, even when wearing glasses/contacts?: No Does the patient have difficulty concentrating, remembering, or making decisions?: No  Permission Sought/Granted   Permission granted to share information with : Yes, Release of Information Signed  Share Information with NAME: [name]           Emotional Assessment Appearance:: Appears stated age     Orientation: : Oriented to Self, Oriented to Place Alcohol  / Substance Use: Not Applicable Psych Involvement: No (comment)  Admission diagnosis:  Cellulitis [L03.90] Weakness [R53.1] Cellulitis, unspecified cellulitis site [L03.90] Patient Active Problem List   Diagnosis Date Noted   Acute cystitis 04/03/2023   Lower extremity cellulitis 04/03/2023   Bilateral lower extremity pain 05/21/2022   FTT (failure to thrive) in adult 05/01/2022   Chronic diastolic CHF (congestive heart failure) (HCC) 04/30/2022   GERD without esophagitis 04/30/2022   Right hip pain 04/30/2022   Generalized weakness 04/28/2022   Cellulitis of bilateral lower extremities 04/20/2022   Lymphedema 04/20/2022   Personal history of thromboembolic disease 97/95/7975   History of pulmonary embolism 04/20/2022   UTI (urinary tract infection) 01/13/2021   Acute metabolic encephalopathy 01/13/2021   Upper GI  bleed 01/23/2018   Leukocytosis 01/23/2018   Physical deconditioning 01/23/2018   Pulmonary embolism (HCC) 01/04/2018   Acute encephalopathy 09/28/2017   Constipation, slow transit 09/28/2017   Unsteady gait 04/28/2017   Spondylolisthesis 07/17/2016   Lymphedema of both lower extremities 07/17/2016   Pain syndrome, chronic    Chronic depression 05/23/2016   Chronic back pain 02/25/2016   Lesion of liver  02/25/2016   History of fracture of left hip 11/28/2015   Fall 11/28/2015   RLS (restless legs syndrome) 11/28/2015   Hypokalemia 03/14/2014   Confusion 03/14/2014   Recurrent UTI 03/14/2014   Effusion of right knee 02/22/2014   Alcohol  dependence (HCC) 02/22/2014   Substance induced mood disorder (HCC) 02/22/2014   Hepatic encephalopathy (HCC)    Altered mental status 02/21/2014   SIRS (systemic inflammatory response syndrome) (HCC) 02/21/2014   S/P total knee arthroplasty 01/02/2014   Essential hypertension 11/10/2013   LAFB (left anterior fascicular block) 11/10/2013   Obesity, unspecified 11/10/2013   PCP:  Pcp, No Pharmacy:   [id number] [address] [place], [place] - [address] [place] [address] Phone: [phone] Fax: [phone]  [place] [facility] [address] [place] [address] Phone: [phone] Fax: [phone]  [facility] [place], [place] - [address] [place] [address] Phone: [phone] Fax: [phone]     Social Drivers of Health (SDOH) Social History: SDOH Screenings   Food Insecurity: No Food Insecurity (04/21/2023)  Housing: Low Risk  (04/21/2023)  Transportation Needs: No Transportation Needs (04/21/2023)  Utilities: Not At Risk (04/21/2023)  Depression (PHQ2-9): Low Risk  (01/09/2019)  Social Connections: Patient Declined (04/21/2023)  Tobacco Use: Medium Risk (04/21/2023)   SDOH Interventions:     Readmission Risk Interventions    04/08/2023    1:02 PM 04/06/2023    3:38 PM 05/02/2022   11:19 AM  Readmission Risk Prevention Plan  Transportation Screening Complete Complete Complete  PCP or Specialist Appt within 3-5 Days Complete Complete   HRI or Home Care Consult Complete Complete   Social Work Consult for Recovery Care Planning/Counseling Complete Complete   Palliative Care Screening Complete Not Applicable   Medication Review  Oceanographer) Complete Complete Complete  PCP or Specialist appointment within 3-5 days of discharge   Complete  HRI or Home Care Consult   Complete  SW Recovery Care/Counseling Consult   Complete  Palliative Care Screening   Not Applicable  Skilled Nursing Facility   Complete

## 2023-04-22 NOTE — Consult Note (Signed)
 WOC Nurse Consult Note: Consult requested for bilat legs.  Performed remotely after review of progress notes and photos in the EMR.   Pt is familiar to [facility] team from a recent visit on 1/19. Bilat legs with generalized lymphedema and erythremia and and black dry crusted skin to lower legs; appearance is consistent with cellulitis and venous stasis changes. Pt is already on antibiotics for the cellulitis.  Dressing procedure/placement/frequency: Topical treatment orders provided for bedside nurses to perform as follows to assist with removal of loose skin:  Cleanse BLE wounds with saline Q day, pat dry.  Apply xeroform gauze and then cover with ABD pads and kerlex. Please re-consult if further assistance is needed.  Thank-you,  Stephane Fought MSN, RN, CWOCN, [place], CNS [phone]

## 2023-04-22 NOTE — Progress Notes (Signed)
 PROGRESS NOTE   Denise Kelly  FMW:[id number]    DOB: 14-Dec-1940    DOA: 04/21/2023  PCP: Pcp, No   I have briefly reviewed patients previous medical records in [facility].  Chief Complaint  Patient presents with   Weakness    Brief Hospital Course:  83 year old female, reportedly lives alone, ambulates with a walker, medical history significant for alcohol  use disorder, anxiety, depression, GERD, hypertension, pulmonary embolism on apixaban , hard of hearing, recent hospitalization 1/17 - 1/22 for UTI and bilateral lower extremity cellulitis when she was discharged home on cefadroxil , presented to ED on 04/21/2023 with complaints of progressively worsening bilateral lower extremity rash x 4 weeks, associated with worsening pain.  Admitted for suspected acute bilateral lower extremity cellulitis.   Assessment & Plan:  Active Problems:   Cellulitis of bilateral lower extremities   Chronic diastolic CHF (congestive heart failure) (HCC)   Essential hypertension   GERD without esophagitis   Obesity, unspecified   Alcohol  dependence (HCC)   Pulmonary embolism (HCC)   Cellulitis of bilateral lower extremities Unsure if this is recurrent or incomplete resolution from previous episode in January 2025. Complicating underlying chronic lymphedema/venous stasis. Started empirically on IV ceftriaxone  WOC RN input appreciated and agree with their topical wound care recommendations PT and OT have recommended SNF. Possibly additional 48 hours of IV antibiotics, monitor response prior to discharge  Chronic diastolic CHF Compensated Continue prior home dose of Lasix  40 mg daily  Hypokalemia: Replace and follow  Essential hypertension Controlled off of antihypertensives  GERD without esophagitis Continue Protonix   History of pulmonary embolism Continue apixaban  anticoagulation  Alcohol  use disorder No overt withdrawal noted  Body mass index is 34.18 kg/m./Obesity Complicates  care.  Outpatient follow-up.  DVT prophylaxis: SCDs Start: 04/21/23 2312     Code Status: Full Code:  Family Communication: None at bedside Disposition:  Status is: Observation The patient will require care spanning > 2 midnights and should be moved to inpatient because: IV antibiotics and close monitoring pertaining to her bilateral lower extremity cellulitis     Consultants:     Procedures:     Antimicrobials:   IV ceftriaxone    Subjective:  Patient is hard of hearing.  Does not appear to have hearing aids.  Overall poor historian.  Reports pain in bilateral lower extremities and asked not to touch them.  She is unable to say how they look compared to when she first came into the hospital.  Objective:   Vitals:   04/22/23 0000 04/22/23 0438 04/22/23 0719 04/22/23 1542  BP:  111/69 106/69 (!) 128/113  Pulse:  90 99 100  Resp:  18 16 16   Temp:  98.5 F (36.9 C) 98.1 F (36.7 C) (!) 97.4 F (36.3 C)  TempSrc:  Oral Oral Oral  SpO2:  94% 96% 97%  Weight: 99 kg     Height: 5' 7.01 (1.702 m)       General exam: Elderly female, moderately built and obese lying comfortably supine in bed without distress. Respiratory system: Clear to auscultation. Respiratory effort normal. Cardiovascular system: S1 & S2 heard, RRR. No JVD, murmurs, rubs, gallops or clicks. No pedal edema.  Not on telemetry. Gastrointestinal system: Abdomen is nondistended, soft and nontender. No organomegaly or masses felt. Normal bowel sounds heard. Central nervous system: Alert and oriented. No focal neurological deficits. Extremities: Symmetric 5 x 5 power.  Bilateral legs noted with some lymphedema, extensive black and dry crusted skin to both legs, the crusting  surrounded by faint erythema.  Obviously tender to touch.  No purulence. Skin: No rashes, lesions or ulcers Psychiatry: Judgement and insight appear impaired. Mood & affect appropriate.     Data Reviewed:   I have personally reviewed  following labs and imaging studies   CBC: Recent Labs  Lab 04/17/23 0852 04/21/23 1219 04/22/23 0623  WBC 6.3 7.8 5.6  NEUTROABS 4.5  --   --   HGB 12.7 13.7 12.7  HCT 40.0 41.6 39.2  MCV 90.5 87.4 88.9  PLT 223 262 222    Basic Metabolic Panel: Recent Labs  Lab 04/17/23 0852 04/21/23 1219 04/22/23 0623  NA 140 139 141  K 3.3* 3.7 3.4*  CL 101 100 103  CO2 31 27 31   GLUCOSE 129* 122* 111*  BUN 10 10 10   CREATININE 0.60 0.83 0.99  CALCIUM  8.4* 8.7* 8.2*    Liver Function Tests: Recent Labs  Lab 04/21/23 1219  AST 29  ALT 17  ALKPHOS 86  BILITOT 0.7  PROT 6.5  ALBUMIN  2.9*    CBG: No results for input(s): GLUCAP in the last 168 hours.  Microbiology Studies:   Recent Results (from the past 240 hours)  Resp panel by RT-PCR (RSV, Flu A&B, Covid) Anterior Nasal Swab     Status: None   Collection Time: 04/21/23 12:47 PM   Specimen: Anterior Nasal Swab  Result Value Ref Range Status   SARS Coronavirus 2 by RT PCR NEGATIVE NEGATIVE Final   Influenza A by PCR NEGATIVE NEGATIVE Final   Influenza B by PCR NEGATIVE NEGATIVE Final    Comment: (NOTE) The Xpert Xpress SARS-CoV-2/FLU/RSV plus assay is intended as an aid in the diagnosis of influenza from Nasopharyngeal swab specimens and should not be used as a sole basis for treatment. Nasal washings and aspirates are unacceptable for Xpert Xpress SARS-CoV-2/FLU/RSV testing.  Fact Sheet for Patients: bloggercourse.com  Fact Sheet for Healthcare Providers: seriousbroker.it  This test is not yet approved or cleared by the [place]  FDA and has been authorized for detection and/or diagnosis of SARS-CoV-2 by FDA under an Emergency Use Authorization (EUA). This EUA will remain in effect (meaning this test can be used) for the duration of the COVID-19 declaration under Section 564(b)(1) of the Act, 21 U.S.C. section 360bbb-3(b)(1), unless the authorization is  terminated or revoked.     Resp Syncytial Virus by PCR NEGATIVE NEGATIVE Final    Comment: (NOTE) Fact Sheet for Patients: bloggercourse.com  Fact Sheet for Healthcare Providers: seriousbroker.it  This test is not yet approved or cleared by the [place]  FDA and has been authorized for detection and/or diagnosis of SARS-CoV-2 by FDA under an Emergency Use Authorization (EUA). This EUA will remain in effect (meaning this test can be used) for the duration of the COVID-19 declaration under Section 564(b)(1) of the Act, 21 U.S.C. section 360bbb-3(b)(1), unless the authorization is terminated or revoked.  Performed at [facility], [address] [place], [place] [address]     Radiology Studies:  DG Pelvis Portable Result Date: 04/21/2023 CLINICAL DATA:  Fall. EXAM: PORTABLE PELVIS 1-2 VIEWS COMPARISON:  Pelvis x-ray dated April 30, 2022. FINDINGS: No acute fracture or dislocation. Old healed left proximal femur fracture status post ORIF. Similar bilateral hip degenerative changes. Osteopenia. Prior L4-L5 fusion. Soft tissues are unremarkable. IMPRESSION: 1. No acute osseous abnormality. Electronically Signed   By: Elsie ONEIDA Shoulder M.D.   On: 04/21/2023 14:50   DG Chest Portable 1 View Result Date: 04/21/2023 CLINICAL  DATA:  Fall. EXAM: PORTABLE CHEST 1 VIEW COMPARISON:  CT chest and chest x-ray dated April 08, 2023. FINDINGS: The heart appears mildly enlarged, although this is likely accentuated by technique given normal heart size on the prior study 2 weeks ago. Mediastinal contours are otherwise unremarkable. Low lung volumes. No focal consolidation, pleural effusion, or pneumothorax. No acute osseous abnormality. IMPRESSION: 1. Low lung volumes. No acute cardiopulmonary disease. Electronically Signed   By: Elsie ONEIDA Shoulder M.D.   On: 04/21/2023 14:47    Scheduled Meds:    apixaban   5 mg Oral BID   furosemide   40 mg Oral  Daily   pantoprazole   40 mg Oral Daily   pramipexole   1 mg Oral QPM    Continuous Infusions:    cefTRIAXone  (ROCEPHIN )  IV 1 g (04/21/23 2334)     LOS: 0 days     Trenda Mar, MD,  FACP, [facility], [facility], [facility]   [facility] Ad[facility] [place]      To contact the attending provider between 7A-7P or the covering provider during after hours 7P-7A, please log into the web site [email or web address] and access using universal [place] password for that web site. If you do not have the password, please call the hospital operator.  04/22/2023, 4:31 PM

## 2023-04-22 NOTE — Evaluation (Signed)
 Physical Therapy Evaluation Patient Details Name: Denise Kelly MRN: [id number] DOB: [birth year] Today's Date: 04/22/2023  History of Present Illness  83 y.o. female presents to [facility] 04/21/23 with worsening LE rash for 4 weeks. Admitted with recurrent B LE cellulitis and chronic lymphedema. Recent admit 1/17 and 1/22 for UTI and BLE cellulitis. PMHx: alcohol  abuse, anxiety/depression, GERD, HTN, HOH, obesity, chronic diastolic CHF, PE   Clinical Impression  Pt in bed upon arrival and agreeable to limited PT eval. Prior to admit, pt reports increased weakness and difficulty getting out of bed. Prior to this, pt would ambulate short distances with RW. Pt was only agreeable to bed level eval at this time and did not want to get out of bed. Pt had limited B ankle DF/PF and ability to wiggle toes due to pain. Pt was able to perform B quad contraction. Pt presents to therapy session with decreased LE strength and mobility. Pt would benefit from acute skilled PT to address functional impairments. Recommending post-acute rehab <3hrs to work towards independence with mobility. Acute PT to follow.          If plan is discharge home, recommend the following: A lot of help with walking and/or transfers;A lot of help with bathing/dressing/bathroom;Assist for transportation;Help with stairs or ramp for entrance;Assistance with cooking/housework   Can travel by private vehicle   No    Equipment Recommendations None recommended by PT     Functional Status Assessment Patient has had a recent decline in their functional status and demonstrates the ability to make significant improvements in function in a reasonable and predictable amount of time.     Precautions / Restrictions Precautions Precautions: Fall Precaution Comments: BLE cellulitis Restrictions Weight Bearing Restrictions Per Provider Order: No      Mobility  Bed Mobility  General bed mobility comments: unable to assess, pt refused mobility      Balance Overall balance assessment: History of Falls       Pertinent Vitals/Pain Pain Assessment Pain Assessment: Faces Faces Pain Scale: Hurts whole lot Pain Location: with any movement or touch of BLE. Pain Descriptors / Indicators: Grimacing, Discomfort Pain Intervention(s): Limited activity within patient's tolerance, Monitored during session, Premedicated before session, Repositioned    Home Living Family/patient expects to be discharged to:: Assisted living    Home Equipment: Rolling Walker (2 wheels) Additional Comments: difficult to get history, pt wears O2 at home    Prior Function    Mobility Comments: Uses RW. States I don't know. As far as I need to, when asked how far she normally walks. Per chart, ambulation has been progressively more difficult. ADLs Comments: Pt reports she sponge bathes at baseline and does not receive any assistance for ADL tasks. ADL completion has been progressively more difficult per chart review. Pt has been unable to monitor progression of leg swelling/edema due to limited mobility.     Extremity/Trunk Assessment   Upper Extremity Assessment Upper Extremity Assessment: Defer to OT evaluation    Lower Extremity Assessment Lower Extremity Assessment: Generalized weakness (B LE cellulitis, Good B quad activation, limited ankle DF/PF ROM. Unable to test sensationd due to pt refusal)    Cervical / Trunk Assessment Cervical / Trunk Assessment: Other exceptions (unable to assess)  Communication   Communication Communication: Hearing impairment  Cognition Arousal: Alert Behavior During Therapy: Agitated, Flat affect Overall Cognitive Status: History of cognitive impairments - at baseline  General Comments: limited engagement with therapy, refused further session beyond bed level eval  General Comments General comments (skin integrity, edema, etc.): VSS on 2L [place], B LE edema, redness, scabs and skin peeling     PT Assessment  Patient needs continued PT services  PT Problem List Decreased strength;Decreased balance;Decreased range of motion;Decreased mobility;Obesity;Pain;Decreased skin integrity;Decreased activity tolerance       PT Treatment Interventions DME instruction;Therapeutic activities;Gait training;Therapeutic exercise;Patient/family education;Functional mobility training    PT Goals (Current goals can be found in the Care Plan section)  Acute Rehab PT Goals Patient Stated Goal: to go home PT Goal Formulation: With patient Time For Goal Achievement: 05/06/23 Potential to Achieve Goals: Fair    Frequency Min 1X/week     Co-evaluation   Reason for Co-Treatment: To address functional/ADL transfers;Necessary to address cognition/behavior during functional activity PT goals addressed during session: Mobility/safety with mobility OT goals addressed during session: ADL's and self-care;Strengthening/ROM       AM-PAC PT 6 Clicks Mobility  Outcome Measure Help needed turning from your back to your side while in a flat bed without using bedrails?: Total Help needed moving from lying on your back to sitting on the side of a flat bed without using bedrails?: Total Help needed moving to and from a bed to a chair (including a wheelchair)?: Total Help needed standing up from a chair using your arms (e.g., wheelchair or bedside chair)?: Total Help needed to walk in hospital room?: Total Help needed climbing 3-5 steps with a railing? : Total 6 Click Score: 6    End of Session   Activity Tolerance: Other (comment) (pt not agreeable to continued eval) Patient left: in bed;with call bell/phone within reach;with bed alarm set Nurse Communication: Mobility status PT Visit Diagnosis: Unsteadiness on feet (R26.81);Muscle weakness (generalized) (M62.81)    Time: 8983-8971 PT Time Calculation (min) (ACUTE ONLY): 12 min   Charges:   PT Evaluation $PT Eval Low Complexity: 1 Low   PT General Charges $$  ACUTE PT VISIT: 1 Visit         Denise Kelly, PT, DPT Secure Chat Preferred  Rehab Office [phone]   Denise Kelly 04/22/2023, 11:02 AM

## 2023-04-22 NOTE — NC FL2 (Signed)
 [place]  MEDICAID FL2 LEVEL OF CARE FORM     IDENTIFICATION  Patient Name: Denise Kelly Birthdate: 12-Oct-1940 Sex: female Admission Date (Current Location): 04/21/2023  [facility] and [place] Number:  Producer, Television/film/video and Address:  [facility] [place], [address] [place], [place] [address]      Provider Number: [id number]  Attending Physician Name and Address:  Judeth Trenda BIRCH, MD  Relative Name and Phone Number:       Current Level of Care: Hospital Recommended Level of Care: Skilled Nursing Facility Prior Approval Number:    Date Approved/Denied:   PASRR Number: [id number]  Discharge Plan: SNF    Current Diagnoses: Patient Active Problem List   Diagnosis Date Noted   Acute cystitis 04/03/2023   Lower extremity cellulitis 04/03/2023   Bilateral lower extremity pain 05/21/2022   FTT (failure to thrive) in adult 05/01/2022   Chronic diastolic CHF (congestive heart failure) (HCC) 04/30/2022   GERD without esophagitis 04/30/2022   Right hip pain 04/30/2022   Generalized weakness 04/28/2022   Cellulitis of bilateral lower extremities 04/20/2022   Lymphedema 04/20/2022   Personal history of thromboembolic disease 97/95/7975   History of pulmonary embolism 04/20/2022   UTI (urinary tract infection) 01/13/2021   Acute metabolic encephalopathy 01/13/2021   Upper GI bleed 01/23/2018   Leukocytosis 01/23/2018   Physical deconditioning 01/23/2018   Pulmonary embolism (HCC) 01/04/2018   Acute encephalopathy 09/28/2017   Constipation, slow transit 09/28/2017   Unsteady gait 04/28/2017   Spondylolisthesis 07/17/2016   Lymphedema of both lower extremities 07/17/2016   Pain syndrome, chronic    Chronic depression 05/23/2016   Chronic back pain 02/25/2016   Lesion of liver 02/25/2016   History of fracture of left hip 11/28/2015   Fall 11/28/2015   RLS (restless legs syndrome) 11/28/2015   Hypokalemia 03/14/2014   Confusion 03/14/2014    Recurrent UTI 03/14/2014   Effusion of right knee 02/22/2014   Alcohol  dependence (HCC) 02/22/2014   Substance induced mood disorder (HCC) 02/22/2014   Hepatic encephalopathy (HCC)    Altered mental status 02/21/2014   SIRS (systemic inflammatory response syndrome) (HCC) 02/21/2014   S/P total knee arthroplasty 01/02/2014   Essential hypertension 11/10/2013   LAFB (left anterior fascicular block) 11/10/2013   Obesity, unspecified 11/10/2013    Orientation RESPIRATION BLADDER Height & Weight     Self, Place  O2 ([place] 2L) Continent Weight: 218 lb 4.1 oz (99 kg) Height:  5' 7.01 (170.2 cm)  BEHAVIORAL SYMPTOMS/MOOD NEUROLOGICAL BOWEL NUTRITION STATUS      Continent Diet (See DC summary)  AMBULATORY STATUS COMMUNICATION OF NEEDS Skin   Extensive Assist Verbally Skin abrasions (MASD lower abdomen, Bilateral Breast MASD, Bilateral leg wounds)                       Personal Care Assistance Level of Assistance  Bathing, Feeding, Dressing Bathing Assistance: Maximum assistance Feeding assistance: Maximum assistance Dressing Assistance: Maximum assistance     Functional Limitations Info  Sight, Hearing, Speech Sight Info: Impaired Hearing Info: Impaired Speech Info: Adequate    SPECIAL CARE FACTORS FREQUENCY  PT (By licensed PT), OT (By licensed OT)     PT Frequency: 5x week OT Frequency: 5x week            Contractures Contractures Info: Not present    Additional Factors Info  Code Status, Allergies Code Status Info: Full Allergies Info: Sulfa Antibiotics  Coreg (Carvedilol)  Motrin (Ibuprofen)  Toprol  Xl (Metoprolol  Tartrate)  Doxycycline  Hyclate  Flovent Hfa (Fluticasone)  Statins           Current Medications (04/22/2023):  This is the current hospital active medication list Current Facility-Administered Medications  Medication Dose Route Frequency Provider Last Rate Last Admin   acetaminophen  (TYLENOL ) tablet 650 mg  650 mg Oral Q6H PRN Arrien, Mauricio  Daniel, MD   650 mg at 04/22/23 1155   Or   acetaminophen  (TYLENOL ) suppository 650 mg  650 mg Rectal Q6H PRN Arrien, Elidia Sieving, MD       apixaban  (ELIQUIS ) tablet 5 mg  5 mg Oral BID Arrien, Elidia Sieving, MD   5 mg at 04/22/23 0825   cefTRIAXone  (ROCEPHIN ) 1 g in sodium chloride  0.9 % 100 mL IVPB  1 g Intravenous Q24H Arrien, Mauricio Daniel, MD 200 mL/hr at 04/21/23 2334 1 g at 04/21/23 2334   furosemide  (LASIX ) tablet 40 mg  40 mg Oral Daily Arrien, Mauricio Daniel, MD   40 mg at 04/22/23 0825   hydrOXYzine  (ATARAX ) tablet 10 mg  10 mg Oral TID PRN Arrien, Mauricio Daniel, MD       ondansetron  (ZOFRAN ) tablet 4 mg  4 mg Oral Q6H PRN Arrien, Mauricio Daniel, MD       Or   ondansetron  (ZOFRAN ) injection 4 mg  4 mg Intravenous Q6H PRN Arrien, Mauricio Daniel, MD       pantoprazole  (PROTONIX ) EC tablet 40 mg  40 mg Oral Daily Arrien, Mauricio Daniel, MD   40 mg at 04/22/23 9173   phenazopyridine  (PYRIDIUM ) tablet 100 mg  100 mg Oral TID PRN Arrien, Mauricio Daniel, MD       pramipexole  (MIRAPEX ) tablet 1 mg  1 mg Oral QPM Arrien, Elidia Sieving, MD       traZODone  (DESYREL ) tablet 100 mg  100 mg Oral QHS PRN Arrien, Mauricio Daniel, MD         Discharge Medications: Please see discharge summary for a list of discharge medications.  Relevant Imaging Results:  Relevant Lab Results:   Additional Information SS# [id number] [place]

## 2023-04-22 NOTE — Progress Notes (Signed)
 Echocardiogram 2D Echocardiogram has been performed.  Denise Kelly 04/22/2023, 3:22 PM

## 2023-04-22 NOTE — Progress Notes (Signed)
 PT Cancellation Note  Patient Details Name: Denise Kelly MRN: [id number] DOB: [birth year]   Cancelled Treatment:    Reason Eval/Treat Not Completed: Patient declined, stating it was too early and to go away. Acute PT to re-attempt as able.  Kate ORN, PT, DPT Secure Chat Preferred  Rehab Office [phone]  Kate BRAVO [name] 04/22/2023, 8:50 AM

## 2023-04-22 NOTE — Evaluation (Signed)
 Occupational Therapy Evaluation Patient Details Name: Denise Kelly MRN: [id number] DOB: [birth year] Today's Date: 04/22/2023   History of Present Illness 83 y.o. female with medical history significant of alcohol  abuse, anxiety, depression, GERD,  and hypertension who was admitted on 04/21/23 with difficulty walking. Recent hospitalization 01/17 to 04/08/23 for urinary tract infection and bilateral lower extremity cellulitis. PMH: ETOH, anxiety, skin cancer, arthritis, HTN, R TKA, L TKA, lumbar fusion, chronic lymphedema.   Clinical Impression   Pt admitted with the above diagnosis. Pt currently with functional limitations due to the deficits listed below (see OT Problem List). Prior to admit, pt was living at [facility] and states she completed BADL tasks without assistance. Utilizes RW for mobility. Based on chart review, mobility has been progressively more difficult due to BLE edema and swelling. Eval limited due to pt's decreased cooperation and willingness to participate in therapy evaluation. Pt refused mobility and clinical judgement was used to determine functional performance levels. Patient will benefit from continued inpatient follow up therapy, <3 hours/day. Pt will benefit from acute skilled OT to increase their safety and independence with ADL and functional mobility for ADL to facilitate discharge. OT will continue to follow patient and work with her if willing to participate.         If plan is discharge home, recommend the following: Two people to help with walking and/or transfers;Two people to help with bathing/dressing/bathroom    Functional Status Assessment  Patient has had a recent decline in their functional status and demonstrates the ability to make significant improvements in function in a reasonable and predictable amount of time.  Equipment Recommendations  Other (comment) (defer to next venue of care)       Precautions / Restrictions  Precautions Precautions: Fall Precaution Comments: BLE cellulitis Restrictions Weight Bearing Restrictions Per Provider Order: No      Mobility Bed Mobility Overal bed mobility:  (Pt refused bed mobility assessment. level based on clinical judgement.) Bed Mobility: Supine to Sit, Sit to Supine, Rolling Rolling: Total assist, +2 for physical assistance, +2 for safety/equipment   Supine to sit: Total assist, +2 for physical assistance, Used rails, HOB elevated Sit to supine: Total assist, +2 for physical assistance, Used rails, HOB elevated   General bed mobility comments: Per PT eval from 2 weeks ago, pt sleeps in recliner. During today's eval, pt states she has a regular bed at home. Did not provide any information regarding use of recliner.    Transfers Overall transfer level:  (Pt refused any mobility. Transfer not assessed.)    General transfer comment: [place] recommended for transfers at this time until therapy can assess.      Balance Overall balance assessment: History of Falls        ADL either performed or assessed with clinical judgement   ADL       General ADL Comments: Pt unwilling to participate in any mobility. At this time, pt will more than likely require 2 person total assist at bed level for all BADL tasks.     Vision Baseline Vision/History: 1 Wears glasses Ability to See in Adequate Light: 0 Adequate Patient Visual Report: No change from baseline       Perception Perception: Not tested       Praxis Praxis: Not tested       Pertinent Vitals/Pain Pain Assessment Pain Assessment: Faces Faces Pain Scale: Hurts whole lot Pain Location: with any movement or touch of BLE. Pain Descriptors / Indicators: Grimacing, Discomfort  Pain Intervention(s): Limited activity within patient's tolerance, Monitored during session, Other (comment) (Pain meds requested although too soon for additional meds.)     Extremity/Trunk Assessment Upper Extremity  Assessment Upper Extremity Assessment:  (Unknown. Pt refused to be formally assessed.)   Lower Extremity Assessment Lower Extremity Assessment: Defer to PT evaluation   Cervical / Trunk Assessment Cervical / Trunk Assessment:  (unable to assess)   Communication Communication Communication: Hearing impairment   Cognition Arousal: Alert Behavior During Therapy: Agitated, Flat affect Overall Cognitive Status: History of cognitive impairments - at baseline    General Comments: limited engagement with therapy during eval. Not forthcoming with background information. Refused any mobility or bed level mobility.     General Comments  2L O2 [place] used during session. BLE edema, redness, scabs, and skin peeling noted. Toe nails long and in need of cutting.            Home Living Family/patient expects to be discharged to:: Assisted living [facility])    Home Equipment: Agricultural Consultant (2 wheels)   Additional Comments: Pt was not forth coming with information and obtaining background history was very difficulty. Information taken from medical chart. Pt confirms that she wears O2 at home. Replies, When I want to, when asked what her wearing schedule is.      Prior Functioning/Environment    Mobility Comments: Uses RW. States I don't know. As far as I need to, when asked how far she normally walks. Per chart, ambulation has been progressively more difficult. ADLs Comments: Pt reports she sponge bathes at baseline and does not receive any assistance for ADL tasks. ADL completion has been progressively more difficult per chart review. Pt has been unable to monitor progression of leg swelling/edema due to limited mobility.        OT Problem List: Decreased strength;Pain;Increased edema;Decreased activity tolerance;Decreased safety awareness;Impaired balance (sitting and/or standing)      OT Treatment/Interventions: Self-care/ADL training;DME and/or AE instruction;Balance  training;Therapeutic activities;Patient/family education;Energy conservation;Therapeutic exercise    OT Goals(Current goals can be found in the care plan section) Acute Rehab OT Goals Patient Stated Goal: to be left alone OT Goal Formulation: Patient unable to participate in goal setting Time For Goal Achievement: 05/06/23 Potential to Achieve Goals: Poor  OT Frequency: Min 1X/week    Co-evaluation PT/OT/SLP Co-Evaluation/Treatment: Yes Reason for Co-Treatment: To address functional/ADL transfers;Necessary to address cognition/behavior during functional activity   OT goals addressed during session: ADL's and self-care;Strengthening/ROM      AM-PAC OT 6 Clicks Daily Activity     Outcome Measure Help from another person eating meals?: A Little Help from another person taking care of personal grooming?: A Lot Help from another person toileting, which includes using toliet, bedpan, or urinal?: Total Help from another person bathing (including washing, rinsing, drying)?: Total Help from another person to put on and taking off regular upper body clothing?: Total Help from another person to put on and taking off regular lower body clothing?: Total 6 Click Score: 9   End of Session Equipment Utilized During Treatment: Oxygen   Activity Tolerance: Treatment limited secondary to agitation Patient left: in bed;with bed alarm set;with call bell/phone within reach  OT Visit Diagnosis: Unsteadiness on feet (R26.81);Pain;History of falling (Z91.81);Other abnormalities of gait and mobility (R26.89) Pain - Right/Left: Right (bilateral) Pain - part of body: Leg;Ankle and joints of foot                Time: 8985-8974 OT Time Calculation (min): 11 min  Charges:  OT General Charges $OT Visit: 1 Visit OT Evaluation $OT Eval Low Complexity: 1 Low  Leita Howell, OTR/L,CBIS  Supplemental OT - [facility] and [facility] Secure Chat Preferred    [place] Staffa, Leita BIRCH 04/22/2023, 10:49 AM

## 2023-04-22 NOTE — Care Management Obs Status (Addendum)
 MEDICARE OBSERVATION STATUS NOTIFICATION   Patient Details  Name: Denise Kelly MRN: [id number] Date of Birth: 07-Jun-1940   Medicare Observation Status Notification Given:  Yes  Offered to leave hard copy at bedside or to [facility] a copy. Hope requested NCM to scan the letter and email it to her at [email or web address] . Same done   Stephane Powell Jansky, RN 04/22/2023, 3:11 PM

## 2023-04-23 DIAGNOSIS — L03119 Cellulitis of unspecified part of limb: Secondary | ICD-10-CM | POA: Diagnosis not present

## 2023-04-23 DIAGNOSIS — E876 Hypokalemia: Secondary | ICD-10-CM | POA: Diagnosis not present

## 2023-04-23 LAB — URINALYSIS, ROUTINE W REFLEX MICROSCOPIC
Bilirubin Urine: NEGATIVE
Glucose, UA: NEGATIVE mg/dL
Ketones, ur: NEGATIVE mg/dL
Nitrite: NEGATIVE
Protein, ur: NEGATIVE mg/dL
Specific Gravity, Urine: 1.011 (ref 1.005–1.030)
pH: 5 (ref 5.0–8.0)

## 2023-04-23 LAB — BASIC METABOLIC PANEL
Anion gap: 7 (ref 5–15)
BUN: 8 mg/dL (ref 8–23)
CO2: 30 mmol/L (ref 22–32)
Calcium: 7.7 mg/dL — ABNORMAL LOW (ref 8.9–10.3)
Chloride: 101 mmol/L (ref 98–111)
Creatinine, Ser: 1 mg/dL (ref 0.44–1.00)
GFR, Estimated: 57 mL/min — ABNORMAL LOW (ref >60)
Glucose, Bld: 116 mg/dL — ABNORMAL HIGH (ref 70–99)
Potassium: 2.9 mmol/L — ABNORMAL LOW (ref 3.5–5.1)
Sodium: 138 mmol/L (ref 135–145)

## 2023-04-23 LAB — MAGNESIUM: Magnesium: 1.7 mg/dL (ref 1.7–2.4)

## 2023-04-23 MED ORDER — OXYCODONE HCL 5 MG PO TABS
5.0000 mg | ORAL_TABLET | Freq: Once | ORAL | Status: AC
  Administered 2023-04-23: 5 mg via ORAL
  Filled 2023-04-23: qty 1

## 2023-04-23 MED ORDER — GABAPENTIN 100 MG PO CAPS
100.0000 mg | ORAL_CAPSULE | Freq: Three times a day (TID) | ORAL | Status: DC
  Administered 2023-04-23 – 2023-04-26 (×10): 100 mg via ORAL
  Filled 2023-04-23 (×10): qty 1

## 2023-04-23 MED ORDER — ACETAMINOPHEN 500 MG PO TABS
1000.0000 mg | ORAL_TABLET | Freq: Four times a day (QID) | ORAL | Status: DC | PRN
  Administered 2023-04-23 – 2023-04-25 (×6): 1000 mg via ORAL
  Filled 2023-04-23 (×6): qty 2

## 2023-04-23 MED ORDER — TRAMADOL HCL 50 MG PO TABS
50.0000 mg | ORAL_TABLET | Freq: Two times a day (BID) | ORAL | Status: DC | PRN
  Administered 2023-04-23 – 2023-04-25 (×4): 50 mg via ORAL
  Filled 2023-04-23 (×5): qty 1

## 2023-04-23 MED ORDER — POTASSIUM CHLORIDE CRYS ER 20 MEQ PO TBCR
40.0000 meq | EXTENDED_RELEASE_TABLET | ORAL | Status: AC
  Administered 2023-04-23 (×2): 40 meq via ORAL
  Filled 2023-04-23 (×2): qty 2

## 2023-04-23 NOTE — Progress Notes (Signed)
 PROGRESS NOTE   Denise Kelly  FMW:[id number]    DOB: [birth year]    DOA: 04/21/2023  PCP: Pcp, No   I have briefly reviewed patients previous medical records in [facility].  Chief Complaint  Patient presents with   Weakness    Brief Hospital Course:  83 year old female, reportedly lives alone, ambulates with a walker, medical history significant for alcohol  use disorder, anxiety, depression, GERD, hypertension, pulmonary embolism on apixaban , hard of hearing, recent hospitalization 1/17 - 1/22 for UTI and bilateral lower extremity cellulitis when she was discharged home on cefadroxil , presented to ED on 04/21/2023 with complaints of progressively worsening bilateral lower extremity rash x 4 weeks, associated with worsening pain.  Admitted for suspected acute bilateral lower extremity cellulitis.  Slowly improving.  Hopefully will be ready for DC to SNF in the next 24 to 48 hours on p.o. antibiotics.   Assessment & Plan:  Active Problems:   Cellulitis of bilateral lower extremities   Chronic diastolic CHF (congestive heart failure) (HCC)   Essential hypertension   GERD without esophagitis   Obesity, unspecified   Alcohol  dependence (HCC)   Pulmonary embolism (HCC)   Cellulitis of bilateral lower extremities Unsure if this is recurrent or incomplete resolution from previous episode in January 2025. Complicating underlying chronic lymphedema/venous stasis. Started empirically on IV ceftriaxone  WOC RN input appreciated and agree with their topical wound care recommendations.  Today, bilateral lower extremities are wrapped in dressing.  Will attempt to examine on 2/7 with dressings off. PT and OT have recommended SNF.  Patient not keen on going to SNF but she has a healthcare agent who has to make the final decision. Possibly additional 24 hours of IV antibiotics, monitor response prior to discharge Resume patient home dose of Neurontin  for pain and added low-dose tramadol  as  needed.  Chronic diastolic CHF Compensated Continue prior home dose of Lasix  40 mg daily 2D echo shows LVEF of 60 to 65% without aortic valve stenosis.  Hypokalemia: Labs were not back in a timely manner this morning.  Potassium low at 2.9, may need to be on maintenance potassium due to Lasix .  Replace aggressively and follow.  Checking magnesium .  Essential hypertension Controlled off of antihypertensives  GERD without esophagitis Continue Protonix   History of pulmonary embolism Continue apixaban  anticoagulation  Alcohol  use disorder No overt withdrawal noted  Body mass index is 34.18 kg/m./Obesity Complicates care.  Outpatient follow-up.  DVT prophylaxis: SCDs Start: 04/21/23 2312     Code Status: Full Code:  Family Communication: Discussed with patient's healthcare agent via phone, updated care and answered all questions.  She is agreeable that patient should go to SNF and will speak with patient to try to convince her. Disposition:  Bilateral lower extremity cellulitis on IV antibiotics, slowly improving.     Consultants:     Procedures:     Antimicrobials:   IV ceftriaxone    Subjective:  Hard of hearing.  Patient states that her lower extremity pain may be slightly better.  Objective:   Vitals:   04/22/23 1542 04/22/23 2053 04/23/23 0603 04/23/23 0952  BP: (!) 128/113 (!) 113/53 (!) 100/50 (!) 100/55  Pulse: 100 93 95 92  Resp: 16 18 20 17   Temp: (!) 97.4 F (36.3 C) 98.2 F (36.8 C) 98.3 F (36.8 C) 98 F (36.7 C)  TempSrc: Oral Oral Oral Oral  SpO2: 97% 95% 95% 93%  Weight:      Height:  General exam: Elderly female, moderately built and obese lying comfortably supine in bed without distress. Respiratory system: Clear to auscultation. Respiratory effort normal. Cardiovascular system: S1 & S2 heard, RRR. No JVD, murmurs, rubs, gallops or clicks. No pedal edema.  Not on telemetry. Gastrointestinal system: Abdomen is nondistended, soft and  nontender. No organomegaly or masses felt. Normal bowel sounds heard. Central nervous system: Alert and oriented. No focal neurological deficits. Extremities: Symmetric 5 x 5 power.  Bilateral legs now with dressings recommended by WOC RN.  Dressings clean, dry and intact. Skin: No rashes, lesions or ulcers Psychiatry: Judgement and insight appear impaired. Mood & affect appropriate.     Data Reviewed:   I have personally reviewed following labs and imaging studies   CBC: Recent Labs  Lab 04/17/23 0852 04/21/23 1219 04/22/23 0623  WBC 6.3 7.8 5.6  NEUTROABS 4.5  --   --   HGB 12.7 13.7 12.7  HCT 40.0 41.6 39.2  MCV 90.5 87.4 88.9  PLT 223 262 222    Basic Metabolic Panel: Recent Labs  Lab 04/17/23 0852 04/21/23 1219 04/22/23 0623 04/23/23 0647  NA 140 139 141 138  K 3.3* 3.7 3.4* 2.9*  CL 101 100 103 101  CO2 31 27 31 30   GLUCOSE 129* 122* 111* 116*  BUN 10 10 10 8   CREATININE 0.60 0.83 0.99 1.00  CALCIUM  8.4* 8.7* 8.2* 7.7*    Liver Function Tests: Recent Labs  Lab 04/21/23 1219  AST 29  ALT 17  ALKPHOS 86  BILITOT 0.7  PROT 6.5  ALBUMIN  2.9*    CBG: No results for input(s): GLUCAP in the last 168 hours.  Microbiology Studies:   Recent Results (from the past 240 hours)  Resp panel by RT-PCR (RSV, Flu A&B, Covid) Anterior Nasal Swab     Status: None   Collection Time: 04/21/23 12:47 PM   Specimen: Anterior Nasal Swab  Result Value Ref Range Status   SARS Coronavirus 2 by RT PCR NEGATIVE NEGATIVE Final   Influenza A by PCR NEGATIVE NEGATIVE Final   Influenza B by PCR NEGATIVE NEGATIVE Final    Comment: (NOTE) The Xpert Xpress SARS-CoV-2/FLU/RSV plus assay is intended as an aid in the diagnosis of influenza from Nasopharyngeal swab specimens and should not be used as a sole basis for treatment. Nasal washings and aspirates are unacceptable for Xpert Xpress SARS-CoV-2/FLU/RSV testing.  Fact Sheet for  Patients: bloggercourse.com  Fact Sheet for Healthcare Providers: seriousbroker.it  This test is not yet approved or cleared by the [place]  FDA and has been authorized for detection and/or diagnosis of SARS-CoV-2 by FDA under an Emergency Use Authorization (EUA). This EUA will remain in effect (meaning this test can be used) for the duration of the COVID-19 declaration under Section 564(b)(1) of the Act, 21 U.S.C. section 360bbb-3(b)(1), unless the authorization is terminated or revoked.     Resp Syncytial Virus by PCR NEGATIVE NEGATIVE Final    Comment: (NOTE) Fact Sheet for Patients: bloggercourse.com  Fact Sheet for Healthcare Providers: seriousbroker.it  This test is not yet approved or cleared by the [place]  FDA and has been authorized for detection and/or diagnosis of SARS-CoV-2 by FDA under an Emergency Use Authorization (EUA). This EUA will remain in effect (meaning this test can be used) for the duration of the COVID-19 declaration under Section 564(b)(1) of the Act, 21 U.S.C. section 360bbb-3(b)(1), unless the authorization is terminated or revoked.  Performed at [facility], [address], [place],  [place] [address]     Radiology Studies:  ECHOCARDIOGRAM COMPLETE Result Date: 04/22/2023    ECHOCARDIOGRAM REPORT   Patient Name:   BLAYKE PINERA Date of Exam: 04/22/2023 Medical Rec #:  [id number]       Height:       67.0 in Accession #:    [id number]      Weight:       218.3 lb Date of Birth:  April 25, 1940       BSA:          2.099 m Patient Age:    82 years        BP:           106/69 mmHg Patient Gender: F               HR:           92 bpm. Exam Location:  Inpatient Procedure: 2D Echo, Cardiac Doppler and Color Doppler Indications:    CHF I50.9  History:        Patient has prior history of Echocardiogram examinations, most                 recent  01/06/2018. CHF; Risk Factors:Hypertension.  Sonographer:    Thea Norlander RCS Referring Phys: [phone] Jaskirat Schwieger D Macallister Ashmead IMPRESSIONS  1. Left ventricular ejection fraction, by estimation, is 60 to 65%. The left ventricle has normal function. There is mild left ventricular hypertrophy.  2. Right ventricular systolic function is mildly reduced. The right ventricular size is mildly enlarged.  3. Trivial mitral valve regurgitation.  4. Difficult to see AV leaflets IT appears sclerotic but not stenotic.. The aortic valve was not well visualized. Aortic valve regurgitation is not visualized. Aortic valve sclerosis/calcification is present, without any evidence of aortic stenosis. FINDINGS  Left Ventricle: Left ventricular ejection fraction, by estimation, is 60 to 65%. The left ventricle has normal function. The left ventricular internal cavity size was normal in size. There is mild left ventricular hypertrophy. Right Ventricle: The right ventricular size is mildly enlarged. Right vetricular wall thickness was not assessed. Right ventricular systolic function is mildly reduced. Left Atrium: Left atrial size was normal in size. Right Atrium: Right atrial size was normal in size. Pericardium: There is no evidence of pericardial effusion. Mitral Valve: There is mild thickening of the mitral valve leaflet(s). Mild mitral annular calcification. Trivial mitral valve regurgitation. Tricuspid Valve: The tricuspid valve is normal in structure. Tricuspid valve regurgitation is trivial. Aortic Valve: Difficult to see AV leaflets IT appears sclerotic but not stenotic. The aortic valve was not well visualized. Aortic valve regurgitation is not visualized. Aortic valve sclerosis/calcification is present, without any evidence of aortic stenosis. Aortic valve peak gradient measures 10.0 mmHg. Pulmonic Valve: The pulmonic valve was normal in structure. Pulmonic valve regurgitation is not visualized. Aorta: The aortic root and ascending  aorta are structurally normal, with no evidence of dilitation. IAS/Shunts: No atrial level shunt detected by color flow Doppler.  LEFT VENTRICLE PLAX 2D LVIDd:         3.70 cm   Diastology LVIDs:         2.60 cm   LV e' medial:    4.79 cm/s LV PW:         1.30 cm   LV E/e' medial:  15.1 LV IVS:        1.20 cm   LV e' lateral:   9.03 cm/s LVOT diam:     2.30 cm   LV  E/e' lateral: 8.0 LV SV:         52 LV SV Index:   25 LVOT Area:     4.15 cm  IVC IVC diam: 2.00 cm LEFT ATRIUM           Index        RIGHT ATRIUM          Index LA diam:      2.50 cm 1.19 cm/m   RA Area:     7.60 cm LA Vol (A4C): 51.7 ml 24.63 ml/m  RA Volume:   13.00 ml 6.19 ml/m  AORTIC VALVE AV Area (Vmax): 2.15 cm AV Vmax:        158.00 cm/s AV Peak Grad:   10.0 mmHg LVOT Vmax:      81.67 cm/s LVOT Vmean:     51.833 cm/s LVOT VTI:       0.125 m  AORTA Ao Root diam: 3.20 cm Ao Asc diam:  3.40 cm MITRAL VALVE               TRICUSPID VALVE MV Area (PHT): 4.41 cm    TR Peak grad:   19.7 mmHg MV Decel Time: 172 msec    TR Vmax:        222.00 cm/s MV E velocity: 72.40 cm/s MV A velocity: 73.10 cm/s  SHUNTS MV E/A ratio:  0.99        Systemic VTI:  0.12 m                            Systemic Diam: 2.30 cm Vina Gull MD Electronically signed by Vina Gull MD Signature Date/Time: 04/22/2023/4:33:57 PM    Final     Scheduled Meds:    apixaban   5 mg Oral BID   furosemide   40 mg Oral Daily   gabapentin   100 mg Oral TID   pantoprazole   40 mg Oral Daily   potassium chloride   40 mEq Oral Q4H   pramipexole   1 mg Oral QPM    Continuous Infusions:    cefTRIAXone  (ROCEPHIN )  IV 1 g (04/22/23 2255)     LOS: 1 day     Trenda Mar, MD,  FACP, [facility], [facility], [facility]   [facility] Advisor [place]      To contact the attending provider between 7A-7P or the covering provider during after hours 7P-7A, please log into the web site [email or web address] and access using universal [place] password for that web site. If  you do not have the password, please call the hospital operator.  04/23/2023, 2:55 PM

## 2023-04-23 NOTE — Progress Notes (Addendum)
 PT Cancellation Note  Patient Details Name: Denise Kelly MRN: [id number] DOB: 01/03/1941   Cancelled Treatment:     15:14 Reason Eval/Treat Not Completed: Patient declined, stating she did not feel well. Educated pt on the importance of continued mobility. Acute PT to follow. 16:53- Re-attempted with pt refusal.   Kate ORN, PT, DPT Secure Chat Preferred  Rehab Office [phone]   Kate BRAVO [name] 04/23/2023, 3:24 PM

## 2023-04-23 NOTE — Plan of Care (Signed)

## 2023-04-23 NOTE — TOC Progression Note (Addendum)
 Transition of Care [facility]) - Progression Note    Patient Details  Name: Denise Kelly MRN: [id number] Date of Birth: 04/06/1940  [facility] CM/SW Contact  Harlene Sharps, LCSW Phone Number: 04/23/2023, 3:01 PM  Clinical Narrative:    CSW spoke with [facility] agent who noted they would like for pt to go to [facility]. Hope advised that pt has stated to staff she just wants to go home. Hope notes that pt has been to SNF several times and this is a typical reaction, however pt is disoriented and unable to make decisions at this time. CSW confirmed Hope is a [facility] Agent and not a legal guardian. Plan continues to be for SNF with an ultimate plan of going back to [facility]. CSW spoke with [facility] who requested CSW attempt to start auth as rep was off site. CSW held with [employer] for an hour, unable to start auth. Rep to have someone at the facility work on it. They noted pt's insurance may take several days to get approved. TOC will continue to follow.    Expected Discharge Plan: Skilled Nursing Facility Barriers to Discharge: Continued Medical Work up  Expected Discharge Plan and Services In-house Referral: Clinical Social Work Discharge Planning Services: NA   Living arrangements for the past 2 months: Independent Living Facility                 DME Arranged: N/A DME Agency: NA       HH Arranged: NA HH Agency: NA         Social Determinants of Health (SDOH) Interventions SDOH Screenings   Food Insecurity: No Food Insecurity (04/21/2023)  Housing: Low Risk  (04/21/2023)  Transportation Needs: No Transportation Needs (04/21/2023)  Utilities: Not At Risk (04/21/2023)  Depression (PHQ2-9): Low Risk  (01/09/2019)  Social Connections: Patient Declined (04/21/2023)  Tobacco Use: Medium Risk (04/21/2023)    Readmission Risk Interventions    04/08/2023    1:02 PM 04/06/2023    3:38 PM 05/02/2022   11:19 AM  Readmission Risk Prevention Plan  Transportation Screening Complete  Complete Complete  PCP or Specialist Appt within 3-5 Days Complete Complete   HRI or Home Care Consult Complete Complete   Social Work Consult for Recovery Care Planning/Counseling Complete Complete   Palliative Care Screening Complete Not Applicable   Medication Review Oceanographer) Complete Complete Complete  PCP or Specialist appointment within 3-5 days of discharge   Complete  HRI or Home Care Consult   Complete  SW Recovery Care/Counseling Consult   Complete  Palliative Care Screening   Not Applicable  Skilled Nursing Facility   Complete

## 2023-04-24 DIAGNOSIS — Z86711 Personal history of pulmonary embolism: Secondary | ICD-10-CM

## 2023-04-24 DIAGNOSIS — L03116 Cellulitis of left lower limb: Secondary | ICD-10-CM

## 2023-04-24 DIAGNOSIS — E876 Hypokalemia: Secondary | ICD-10-CM | POA: Diagnosis not present

## 2023-04-24 DIAGNOSIS — L03115 Cellulitis of right lower limb: Principal | ICD-10-CM

## 2023-04-24 DIAGNOSIS — F1090 Alcohol use, unspecified, uncomplicated: Secondary | ICD-10-CM

## 2023-04-24 DIAGNOSIS — L03119 Cellulitis of unspecified part of limb: Secondary | ICD-10-CM | POA: Diagnosis not present

## 2023-04-24 LAB — BASIC METABOLIC PANEL
Anion gap: 7 (ref 5–15)
BUN: 9 mg/dL (ref 8–23)
CO2: 32 mmol/L (ref 22–32)
Calcium: 7.8 mg/dL — ABNORMAL LOW (ref 8.9–10.3)
Chloride: 98 mmol/L (ref 98–111)
Creatinine, Ser: 0.81 mg/dL (ref 0.44–1.00)
GFR, Estimated: >60 mL/min (ref >60)
Glucose, Bld: 118 mg/dL — ABNORMAL HIGH (ref 70–99)
Potassium: 3.9 mmol/L (ref 3.5–5.1)
Sodium: 137 mmol/L (ref 135–145)

## 2023-04-24 MED ORDER — MORPHINE SULFATE (PF) 2 MG/ML IV SOLN
1.0000 mg | Freq: Once | INTRAVENOUS | Status: AC
  Administered 2023-04-24: 1 mg via INTRAVENOUS
  Filled 2023-04-24: qty 1

## 2023-04-24 MED ORDER — MAGNESIUM OXIDE -MG SUPPLEMENT 400 (240 MG) MG PO TABS
400.0000 mg | ORAL_TABLET | Freq: Two times a day (BID) | ORAL | Status: AC
  Administered 2023-04-24 – 2023-04-26 (×4): 400 mg via ORAL
  Filled 2023-04-24 (×4): qty 1

## 2023-04-24 MED ORDER — POTASSIUM CHLORIDE CRYS ER 20 MEQ PO TBCR
20.0000 meq | EXTENDED_RELEASE_TABLET | Freq: Every day | ORAL | Status: DC
Start: 2023-04-24 — End: 2023-04-27
  Administered 2023-04-24 – 2023-04-27 (×4): 20 meq via ORAL
  Filled 2023-04-24 (×4): qty 1

## 2023-04-24 NOTE — Plan of Care (Signed)

## 2023-04-24 NOTE — Progress Notes (Signed)
 Physical Therapy Treatment Patient Details Name: Denise Kelly MRN: [id number] DOB: [birth year] Today's Date: 04/24/2023   History of Present Illness 83 y.o. female presents to [facility] 04/21/23 with worsening LE rash for 4 weeks. Admitted with recurrent B LE cellulitis and chronic lymphedema. Recent admit 1/17 and 1/22 for UTI and BLE cellulitis. PMHx: alcohol  abuse, anxiety/depression, GERD, HTN, HOH, obesity, chronic diastolic CHF, PE    PT Comments  Pt in bed upon arrival and agreeable to PT session with high amounts of encouragement. Pt was agreeable to perform exercises in supine, however, declined further mobility. Pt unable to tolerate any LE movement beyond isometric contractions and ankle pumps. Pt with limited progression towards goals due to lack of participation. Acute PT to follow.      If plan is discharge home, recommend the following: A lot of help with walking and/or transfers;A lot of help with bathing/dressing/bathroom;Assist for transportation;Help with stairs or ramp for entrance;Assistance with cooking/housework   Can travel by private vehicle     No  Equipment Recommendations  None recommended by PT       Precautions / Restrictions Precautions Precautions: Fall Precaution Comments: BLE cellulitis Restrictions Weight Bearing Restrictions Per Provider Order: No     Mobility  Bed Mobility  General bed mobility comments: pt declined         Cognition Arousal: Alert Behavior During Therapy: Flat affect Overall Cognitive Status: History of cognitive impairments - at baseline    General Comments: limited engagement, agreeable to bed level exercsies only        Exercises General Exercises - Upper Extremity Shoulder Flexion: AROM, Both, 10 reps, Supine Elbow Flexion: AROM, Both, 10 reps, Supine General Exercises - Lower Extremity Ankle Circles/Pumps: AROM, Both, 10 reps, Supine Quad Sets: AROM, Both, 10 reps, Supine Gluteal Sets: AROM, Both, 10 reps,  Supine Other Exercises Other Exercises: shoulder retraction, shoulder shrug, B punch out x10 in supine    General Comments General comments (skin integrity, edema, etc.): VSS on RA, dressings on B shins, B LE edema, redness, scabs and skin peeling      Pertinent Vitals/Pain Pain Assessment Pain Assessment: Faces Faces Pain Scale: Hurts even more Pain Location: B LE with movement Pain Descriptors / Indicators: Grimacing, Discomfort Pain Intervention(s): Limited activity within patient's tolerance, Monitored during session, Repositioned     PT Goals (current goals can now be found in the care plan section) Acute Rehab PT Goals Patient Stated Goal: to go home PT Goal Formulation: With patient Time For Goal Achievement: 05/06/23 Potential to Achieve Goals: Fair Progress towards PT goals: Not progressing toward goals - comment (limited engagement with therapy)    Frequency    Min 1X/week       AM-PAC PT 6 Clicks Mobility   Outcome Measure  Help needed turning from your back to your side while in a flat bed without using bedrails?: Total Help needed moving from lying on your back to sitting on the side of a flat bed without using bedrails?: Total Help needed moving to and from a bed to a chair (including a wheelchair)?: Total Help needed standing up from a chair using your arms (e.g., wheelchair or bedside chair)?: Total Help needed to walk in hospital room?: Total Help needed climbing 3-5 steps with a railing? : Total 6 Click Score: 6    End of Session   Activity Tolerance: Patient limited by pain;Other (comment) (limited engagement) Patient left: in bed;with call bell/phone within reach;with bed alarm set Nurse Communication:  Mobility status PT Visit Diagnosis: Unsteadiness on feet (R26.81);Muscle weakness (generalized) (M62.81)     Time: 8885-8874 PT Time Calculation (min) (ACUTE ONLY): 11 min  Charges:    $Therapeutic Exercise: 8-22 mins PT General  Charges $$ ACUTE PT VISIT: 1 Visit                     Denise ORN, PT, DPT Secure Chat Preferred  Rehab Office [phone]   Denise Kelly 04/24/2023, 11:30 AM

## 2023-04-24 NOTE — Care Management Important Message (Signed)
 Important Message  Patient Details  Name: Denise Kelly MRN: [id number] Date of Birth: 1940-10-30   Important Message Given:  Yes - Medicare IM     [name] 04/24/2023, 12:18 PM

## 2023-04-24 NOTE — Progress Notes (Signed)
 PROGRESS NOTE   Denise Kelly  FMW:[id number]    DOB: 1940-11-02    DOA: 04/21/2023  PCP: Pcp, No   I have briefly reviewed patients previous medical records in [facility].  Chief Complaint  Patient presents with   Weakness    Brief Hospital Course:  83 year old female, reportedly lives alone, ambulates with a walker, medical history significant for alcohol  use disorder, anxiety, depression, GERD, hypertension, pulmonary embolism on apixaban , hard of hearing, recent hospitalization 1/17 - 1/22 for UTI and bilateral lower extremity cellulitis when she was discharged home on cefadroxil , presented to ED on 04/21/2023 with complaints of progressively worsening bilateral lower extremity rash x 4 weeks, associated with worsening pain.  Admitted for suspected acute bilateral lower extremity cellulitis.  Slowly improving.  Medically ready for DC to SNF pending insurance authorization.  TOC on board.  Continue IV antibiotics while hospitalized with plans to switch over to oral antibiotics at time of discharge.  Complete total 7 days.   Assessment & Plan:  Active Problems:   Cellulitis of bilateral lower extremities   Chronic diastolic CHF (congestive heart failure) (HCC)   Essential hypertension   GERD without esophagitis   Obesity, unspecified   Alcohol  dependence (HCC)   Pulmonary embolism (HCC)   Cellulitis of bilateral lower extremities Unsure if this is recurrent or incomplete resolution from previous episode in January 2025. Complicating underlying chronic lymphedema/venous stasis. Started empirically on IV ceftriaxone  WOC RN input appreciated and agree with their topical wound care recommendations.   PT and OT have recommended SNF.  Patient not keen on going to SNF.  Discussed with her healthcare agent on 2/6.  She indicated that patient has been to SNF in the past and this is how she does/hesitates prior to going.  She was going to talk to the patient. Continue current modality  pain control including Tylenol , gabapentin , tramadol  as needed.  Pain appears to be improving and controlled. Removed dressing of both legs and examined, cellulitis features slowly improving but suspect that she has lots of chronic underlying changes from lymphedema/venous stasis. Day 4 of IV ceftriaxone  today, continue while hospitalized with plans to change to cefuroxime  500 Mg twice daily to complete a 7-day course.  Continue wound care.  Chronic diastolic CHF Compensated Continue prior home dose of Lasix  40 mg daily 2D echo shows LVEF of 60 to 65% without aortic valve stenosis.  Hypokalemia: Replaced.  Magnesium  1.7.  Started low-dose potassium 20 mEq daily along with Lasix  given recurrent nature of her hypokalemia.  Also supplement oral magnesium  oxide.  Essential hypertension Controlled off of antihypertensives  GERD without esophagitis Continue Protonix   History of pulmonary embolism Continue apixaban  anticoagulation  Alcohol  use disorder No overt withdrawal noted  Body mass index is 34.18 kg/m./Obesity Complicates care.  Outpatient follow-up.  DVT prophylaxis: SCDs Start: 04/21/23 2312     Code Status: Full Code:  Family Communication: None at bedside today. Disposition:  Medically ready for DC pending SNF insurance authorization.     Consultants:     Procedures:     Antimicrobials:   IV ceftriaxone    Subjective:  Poor historian.  Hard of hearing.  Denied complaints.  Specifically no pain voluntarily reported.  Asking when she can go home.  Objective:   Vitals:   04/23/23 0603 04/23/23 0952 04/23/23 1757 04/23/23 2032  BP: (!) 100/50 (!) 100/55 119/68 128/72  Pulse: 95 92 96 82  Resp: 20 17 17 20   Temp: 98.3 F (36.8 C) 98  F (36.7 C) 98.2 F (36.8 C) 98.2 F (36.8 C)  TempSrc: Oral Oral Oral Oral  SpO2: 95% 93% 94% 94%  Weight:      Height:        General exam: Elderly female, moderately built and obese lying comfortably supine in bed  without distress.  Appears to be in good spirits. Respiratory system: Clear to auscultation. Respiratory effort normal. Cardiovascular system: S1 & S2 heard, RRR. No JVD, murmurs, rubs, gallops or clicks. No pedal edema.  Not on telemetry. Gastrointestinal system: Abdomen is nondistended, soft and nontender. No organomegaly or masses felt. Normal bowel sounds heard. Central nervous system: Alert and oriented. No focal neurological deficits. Extremities: Symmetric 5 x 5 power.  Removed dressings and examined both legs today.  The extensive scabbing that she has is starting to soften.  The cellulitic changes i.e. redness and warmth gradually improving. Skin: No rashes, lesions or ulcers Psychiatry: Judgement and insight appear impaired. Mood & affect appropriate.   Pictures of her legs on admission 2//25.      Data Reviewed:   I have personally reviewed following labs and imaging studies   CBC: Recent Labs  Lab 04/21/23 1219 04/22/23 0623  WBC 7.8 5.6  HGB 13.7 12.7  HCT 41.6 39.2  MCV 87.4 88.9  PLT 262 222    Basic Metabolic Panel: Recent Labs  Lab 04/21/23 1219 04/22/23 0623 04/23/23 0647 04/24/23 0502  NA 139 141 138 137  K 3.7 3.4* 2.9* 3.9  CL 100 103 101 98  CO2 27 31 30  32  GLUCOSE 122* 111* 116* 118*  BUN 10 10 8 9   CREATININE 0.83 0.99 1.00 0.81  CALCIUM  8.7* 8.2* 7.7* 7.8*  MG  --   --  1.7  --     Liver Function Tests: Recent Labs  Lab 04/21/23 1219  AST 29  ALT 17  ALKPHOS 86  BILITOT 0.7  PROT 6.5  ALBUMIN  2.9*    CBG: No results for input(s): GLUCAP in the last 168 hours.  Microbiology Studies:   Recent Results (from the past 240 hours)  Resp panel by RT-PCR (RSV, Flu A&B, Covid) Anterior Nasal Swab     Status: None   Collection Time: 04/21/23 12:47 PM   Specimen: Anterior Nasal Swab  Result Value Ref Range Status   SARS Coronavirus 2 by RT PCR NEGATIVE NEGATIVE Final   Influenza A by PCR NEGATIVE NEGATIVE Final   Influenza B by  PCR NEGATIVE NEGATIVE Final    Comment: (NOTE) The Xpert Xpress SARS-CoV-2/FLU/RSV plus assay is intended as an aid in the diagnosis of influenza from Nasopharyngeal swab specimens and should not be used as a sole basis for treatment. Nasal washings and aspirates are unacceptable for Xpert Xpress SARS-CoV-2/FLU/RSV testing.  Fact Sheet for Patients: bloggercourse.com  Fact Sheet for Healthcare Providers: seriousbroker.it  This test is not yet approved or cleared by the [place]  FDA and has been authorized for detection and/or diagnosis of SARS-CoV-2 by FDA under an Emergency Use Authorization (EUA). This EUA will remain in effect (meaning this test can be used) for the duration of the COVID-19 declaration under Section 564(b)(1) of the Act, 21 U.S.C. section 360bbb-3(b)(1), unless the authorization is terminated or revoked.     Resp Syncytial Virus by PCR NEGATIVE NEGATIVE Final    Comment: (NOTE) Fact Sheet for Patients: bloggercourse.com  Fact Sheet for Healthcare Providers: seriousbroker.it  This test is not yet approved or cleared by the [place]  FDA and has  been authorized for detection and/or diagnosis of SARS-CoV-2 by FDA under an Emergency Use Authorization (EUA). This EUA will remain in effect (meaning this test can be used) for the duration of the COVID-19 declaration under Section 564(b)(1) of the Act, 21 U.S.C. section 360bbb-3(b)(1), unless the authorization is terminated or revoked.  Performed at [facility], [address], [place], [place] [address]     Radiology Studies:  ECHOCARDIOGRAM COMPLETE Result Date: 04/22/2023    ECHOCARDIOGRAM REPORT   Patient Name:   UCHENNA SEUFERT Date of Exam: 04/22/2023 Medical Rec #:  [id number]       Height:       67.0 in Accession #:    [id number]      Weight:       218.3 lb Date of Birth:  1940-04-18       BSA:           2.099 m Patient Age:    82 years        BP:           106/69 mmHg Patient Gender: F               HR:           92 bpm. Exam Location:  Inpatient Procedure: 2D Echo, Cardiac Doppler and Color Doppler Indications:    CHF I50.9  History:        Patient has prior history of Echocardiogram examinations, most                 recent 01/06/2018. CHF; Risk Factors:Hypertension.  Sonographer:    Thea Norlander RCS Referring Phys: [id number] Ladonna Vanorder D Famous [place] IMPRESSIONS  1. Left ventricular ejection fraction, by estimation, is 60 to 65%. The left ventricle has normal function. There is mild left ventricular hypertrophy.  2. Right ventricular systolic function is mildly reduced. The right ventricular size is mildly enlarged.  3. Trivial mitral valve regurgitation.  4. Difficult to see AV leaflets IT appears sclerotic but not stenotic.. The aortic valve was not well visualized. Aortic valve regurgitation is not visualized. Aortic valve sclerosis/calcification is present, without any evidence of aortic stenosis. FINDINGS  Left Ventricle: Left ventricular ejection fraction, by estimation, is 60 to 65%. The left ventricle has normal function. The left ventricular internal cavity size was normal in size. There is mild left ventricular hypertrophy. Right Ventricle: The right ventricular size is mildly enlarged. Right vetricular wall thickness was not assessed. Right ventricular systolic function is mildly reduced. Left Atrium: Left atrial size was normal in size. Right Atrium: Right atrial size was normal in size. Pericardium: There is no evidence of pericardial effusion. Mitral Valve: There is mild thickening of the mitral valve leaflet(s). Mild mitral annular calcification. Trivial mitral valve regurgitation. Tricuspid Valve: The tricuspid valve is normal in structure. Tricuspid valve regurgitation is trivial. Aortic Valve: Difficult to see AV leaflets IT appears sclerotic but not stenotic. The aortic valve was not well  visualized. Aortic valve regurgitation is not visualized. Aortic valve sclerosis/calcification is present, without any evidence of aortic stenosis. Aortic valve peak gradient measures 10.0 mmHg. Pulmonic Valve: The pulmonic valve was normal in structure. Pulmonic valve regurgitation is not visualized. Aorta: The aortic root and ascending aorta are structurally normal, with no evidence of dilitation. IAS/Shunts: No atrial level shunt detected by color flow Doppler.  LEFT VENTRICLE PLAX 2D LVIDd:         3.70 cm   Diastology LVIDs:  2.60 cm   LV e' medial:    4.79 cm/s LV PW:         1.30 cm   LV E/e' medial:  15.1 LV IVS:        1.20 cm   LV e' lateral:   9.03 cm/s LVOT diam:     2.30 cm   LV E/e' lateral: 8.0 LV SV:         52 LV SV Index:   25 LVOT Area:     4.15 cm  IVC IVC diam: 2.00 cm LEFT ATRIUM           Index        RIGHT ATRIUM          Index LA diam:      2.50 cm 1.19 cm/m   RA Area:     7.60 cm LA Vol (A4C): 51.7 ml 24.63 ml/m  RA Volume:   13.00 ml 6.19 ml/m  AORTIC VALVE AV Area (Vmax): 2.15 cm AV Vmax:        158.00 cm/s AV Peak Grad:   10.0 mmHg LVOT Vmax:      81.67 cm/s LVOT Vmean:     51.833 cm/s LVOT VTI:       0.125 m  AORTA Ao Root diam: 3.20 cm Ao Asc diam:  3.40 cm MITRAL VALVE               TRICUSPID VALVE MV Area (PHT): 4.41 cm    TR Peak grad:   19.7 mmHg MV Decel Time: 172 msec    TR Vmax:        222.00 cm/s MV E velocity: 72.40 cm/s MV A velocity: 73.10 cm/s  SHUNTS MV E/A ratio:  0.99        Systemic VTI:  0.12 m                            Systemic Diam: 2.30 cm Vina Gull MD Electronically signed by Vina Gull MD Signature Date/Time: 04/22/2023/4:33:57 PM    Final     Scheduled Meds:    apixaban   5 mg Oral BID   furosemide   40 mg Oral Daily   gabapentin   100 mg Oral TID   pantoprazole   40 mg Oral Daily   pramipexole   1 mg Oral QPM    Continuous Infusions:    cefTRIAXone  (ROCEPHIN )  IV 1 g (04/23/23 2301)     LOS: 2 days     Trenda Mar, MD,  FACP,  [facility], [facility], [facility]   [facility] [place]      To contact the attending provider between 7A-7P or the covering provider during after hours 7P-7A, please log into the web site [email or web address] and access using universal [place] password for that web site. If you do not have the password, please call the hospital operator.  04/24/2023, 2:59 PM

## 2023-04-25 DIAGNOSIS — E876 Hypokalemia: Secondary | ICD-10-CM | POA: Diagnosis not present

## 2023-04-25 DIAGNOSIS — L03119 Cellulitis of unspecified part of limb: Secondary | ICD-10-CM | POA: Diagnosis not present

## 2023-04-25 MED ORDER — TRAMADOL HCL 50 MG PO TABS
50.0000 mg | ORAL_TABLET | Freq: Four times a day (QID) | ORAL | Status: DC | PRN
  Administered 2023-04-25 – 2023-04-28 (×10): 50 mg via ORAL
  Filled 2023-04-25 (×10): qty 1

## 2023-04-25 NOTE — Plan of Care (Signed)

## 2023-04-25 NOTE — Plan of Care (Signed)
  Problem: Clinical Measurements: Goal: Respiratory complications will improve Outcome: Progressing   Problem: Nutrition: Goal: Adequate nutrition will be maintained Outcome: Progressing   Problem: Safety: Goal: Ability to remain free from injury will improve Outcome: Progressing   

## 2023-04-25 NOTE — Progress Notes (Signed)
 Pt has a purewick on but she was wet with a towel between her legs. Denise Kelly, NT offered to provide pericare and change linens but pt refused. She stated,I  don't want a man to change me, I want a woman. I told her I would change her and then she became irritated and said, No, not right now I don't want to roll around in this bed. I told her she needs to be dried so she doesn't have any skin breakdown. Patient still refused after being educated. [name] said he would ask [name], NT to change her linens and clean the pt. The pt continued to refuse per  Manuelita.

## 2023-04-25 NOTE — Progress Notes (Signed)
 PROGRESS NOTE   RIKO LUMSDEN  FMW:[id number]    DOB: [birth year]    DOA: 04/21/2023  PCP: Pcp, No   I have briefly reviewed patients previous medical records in [facility].  Chief Complaint  Patient presents with   Weakness    Brief Hospital Course:  83 year old female, reportedly lives alone, ambulates with a walker, medical history significant for alcohol  use disorder, anxiety, depression, GERD, hypertension, pulmonary embolism on apixaban , hard of hearing, recent hospitalization 1/17 - 1/22 for UTI and bilateral lower extremity cellulitis when she was discharged home on cefadroxil , presented to ED on 04/21/2023 with complaints of progressively worsening bilateral lower extremity rash x 4 weeks, associated with worsening pain.  Admitted for suspected acute bilateral lower extremity cellulitis.  Slowly improving.  Medically ready for DC to SNF pending insurance authorization.  TOC on board.  Continue IV antibiotics while hospitalized with plans to switch over to oral antibiotics at time of discharge.  Complete total 7 days.   Assessment & Plan:  Active Problems:   Cellulitis of bilateral lower extremities   Chronic diastolic CHF (congestive heart failure) (HCC)   Essential hypertension   GERD without esophagitis   Obesity, unspecified   Alcohol  dependence (HCC)   Pulmonary embolism (HCC)   Cellulitis of bilateral lower extremities Unsure if this is recurrent or incomplete resolution from previous episode in January 2025. Complicating underlying chronic lymphedema/venous stasis. Started empirically on IV ceftriaxone  WOC RN input appreciated and agree with their topical wound care recommendations.   Continue current modality pain control including Tylenol , gabapentin , tramadol  as needed.  Pain appears to be improving and controlled. Day 5 of IV ceftriaxone  today, continue while hospitalized with plans to change to cefuroxime  500 Mg twice daily to complete a 7-day course.    Improving.  Chronic diastolic CHF Compensated Continue prior home dose of Lasix  40 mg daily 2D echo shows LVEF of 60 to 65% without aortic valve stenosis.  Hypokalemia: Replaced.  Magnesium  1.7.  Started low-dose potassium 20 mEq daily along with Lasix  given recurrent nature of her hypokalemia.  Also supplement oral magnesium  oxide.  Essential hypertension Controlled off of antihypertensives  GERD without esophagitis Continue Protonix   History of pulmonary embolism Continue apixaban  anticoagulation  Alcohol  use disorder No overt withdrawal noted  Body mass index is 34.18 kg/m./Obesity Complicates care.  Outpatient follow-up.  DVT prophylaxis: SCDs Start: 04/21/23 2312     Code Status: Full Code:  Family Communication: None at bedside today. Disposition:  Medically ready for DC pending SNF insurance authorization.     Consultants:     Procedures:     Antimicrobials:   IV ceftriaxone    Subjective:  No new complaints.  No pain reported.  Asking when she will be going home.  Objective:   Vitals:   04/24/23 1943 04/25/23 0408 04/25/23 0725 04/25/23 1530  BP: 139/82 108/63 104/73 114/73  Pulse: 91 90 77 86  Resp: 16 18 16 16   Temp: 98.2 F (36.8 C) 98.1 F (36.7 C) 98.1 F (36.7 C) 97.7 F (36.5 C)  TempSrc: Oral Oral Oral Oral  SpO2: 93% 91% 94% 90%  Weight:      Height:        General exam: Elderly female, moderately built and obese lying comfortably supine in bed without distress.  Appears to be in good spirits. Respiratory system: Clear to auscultation. Respiratory effort normal. Cardiovascular system: S1 & S2 heard, RRR. No JVD, murmurs, rubs, gallops or clicks. No pedal edema.  Not on telemetry. Gastrointestinal system: Abdomen is nondistended, soft and nontender. No organomegaly or masses felt. Normal bowel sounds heard. Central nervous system: Alert and oriented. No focal neurological deficits. Extremities: Symmetric 5 x 5 power.  Bilateral  leg dressings clean and dry.  Will remove again to reassess on 2/9. Skin: No rashes, lesions or ulcers Psychiatry: Judgement and insight appear impaired. Mood & affect appropriate.   Pictures of her legs on admission 2//25.      Data Reviewed:   I have personally reviewed following labs and imaging studies   CBC: Recent Labs  Lab 04/21/23 1219 04/22/23 0623  WBC 7.8 5.6  HGB 13.7 12.7  HCT 41.6 39.2  MCV 87.4 88.9  PLT 262 222    Basic Metabolic Panel: Recent Labs  Lab 04/21/23 1219 04/22/23 0623 04/23/23 0647 04/24/23 0502  NA 139 141 138 137  K 3.7 3.4* 2.9* 3.9  CL 100 103 101 98  CO2 27 31 30  32  GLUCOSE 122* 111* 116* 118*  BUN 10 10 8 9   CREATININE 0.83 0.99 1.00 0.81  CALCIUM  8.7* 8.2* 7.7* 7.8*  MG  --   --  1.7  --     Liver Function Tests: Recent Labs  Lab 04/21/23 1219  AST 29  ALT 17  ALKPHOS 86  BILITOT 0.7  PROT 6.5  ALBUMIN  2.9*    CBG: No results for input(s): GLUCAP in the last 168 hours.  Microbiology Studies:   Recent Results (from the past 240 hours)  Resp panel by RT-PCR (RSV, Flu A&B, Covid) Anterior Nasal Swab     Status: None   Collection Time: 04/21/23 12:47 PM   Specimen: Anterior Nasal Swab  Result Value Ref Range Status   SARS Coronavirus 2 by RT PCR NEGATIVE NEGATIVE Final   Influenza A by PCR NEGATIVE NEGATIVE Final   Influenza B by PCR NEGATIVE NEGATIVE Final    Comment: (NOTE) The Xpert Xpress SARS-CoV-2/FLU/RSV plus assay is intended as an aid in the diagnosis of influenza from Nasopharyngeal swab specimens and should not be used as a sole basis for treatment. Nasal washings and aspirates are unacceptable for Xpert Xpress SARS-CoV-2/FLU/RSV testing.  Fact Sheet for Patients: bloggercourse.com  Fact Sheet for Healthcare Providers: seriousbroker.it  This test is not yet approved or cleared by the [place]  FDA and has been authorized for detection  and/or diagnosis of SARS-CoV-2 by FDA under an Emergency Use Authorization (EUA). This EUA will remain in effect (meaning this test can be used) for the duration of the COVID-19 declaration under Section 564(b)(1) of the Act, 21 U.S.C. section 360bbb-3(b)(1), unless the authorization is terminated or revoked.     Resp Syncytial Virus by PCR NEGATIVE NEGATIVE Final    Comment: (NOTE) Fact Sheet for Patients: bloggercourse.com  Fact Sheet for Healthcare Providers: seriousbroker.it  This test is not yet approved or cleared by the [place]  FDA and has been authorized for detection and/or diagnosis of SARS-CoV-2 by FDA under an Emergency Use Authorization (EUA). This EUA will remain in effect (meaning this test can be used) for the duration of the COVID-19 declaration under Section 564(b)(1) of the Act, 21 U.S.C. section 360bbb-3(b)(1), unless the authorization is terminated or revoked.  Performed at [facility], [address], [place], [place] [address]     Radiology Studies:  No results found.   Scheduled Meds:    apixaban   5 mg Oral BID   furosemide   40 mg Oral Daily   gabapentin   100 mg Oral TID   magnesium  oxide  400 mg Oral BID   pantoprazole   40 mg Oral Daily   potassium chloride   20 mEq Oral Daily   pramipexole   1 mg Oral QPM    Continuous Infusions:    cefTRIAXone  (ROCEPHIN )  IV 1 g (04/25/23 0027)     LOS: 3 days     Trenda Mar, MD,  FACP, [facility], [facility], [facility]   [facility] Ad[facility] [place]      To contact the attending provider between 7A-7P or the covering provider during after hours 7P-7A, please log into the web site [email or web address] and access using universal [place] password for that web site. If you do not have the password, please call the hospital operator.  04/25/2023, 3:43 PM

## 2023-04-26 DIAGNOSIS — E876 Hypokalemia: Secondary | ICD-10-CM | POA: Diagnosis not present

## 2023-04-26 DIAGNOSIS — L03119 Cellulitis of unspecified part of limb: Secondary | ICD-10-CM | POA: Diagnosis not present

## 2023-04-26 LAB — MRSA NEXT GEN BY PCR, NASAL: MRSA by PCR Next Gen: NOT DETECTED

## 2023-04-26 MED ORDER — GABAPENTIN 100 MG PO CAPS
200.0000 mg | ORAL_CAPSULE | Freq: Three times a day (TID) | ORAL | Status: DC
  Administered 2023-04-26 – 2023-04-28 (×7): 200 mg via ORAL
  Filled 2023-04-26 (×7): qty 2

## 2023-04-26 MED ORDER — METHOCARBAMOL 500 MG PO TABS
500.0000 mg | ORAL_TABLET | Freq: Four times a day (QID) | ORAL | Status: DC | PRN
  Administered 2023-04-26 – 2023-04-28 (×3): 500 mg via ORAL
  Filled 2023-04-26 (×3): qty 1

## 2023-04-26 MED ORDER — METHOCARBAMOL 500 MG PO TABS
500.0000 mg | ORAL_TABLET | Freq: Once | ORAL | Status: AC
  Administered 2023-04-26: 500 mg via ORAL
  Filled 2023-04-26: qty 1

## 2023-04-26 NOTE — Progress Notes (Addendum)
 PROGRESS NOTE   Denise Kelly  FMW:[id number]    DOB: 1941-02-26    DOA: 04/21/2023  PCP: Pcp, No   I have briefly reviewed patients previous medical records in [facility].  Chief Complaint  Patient presents with   Weakness    Brief Hospital Course:  83 year old female, reportedly lives alone, ambulates with a walker, medical history significant for alcohol  use disorder, anxiety, depression, GERD, hypertension, pulmonary embolism on apixaban , hard of hearing, recent hospitalization 1/17 - 1/22 for UTI and bilateral lower extremity cellulitis when she was discharged home on cefadroxil , presented to ED on 04/21/2023 with complaints of progressively worsening bilateral lower extremity rash x 4 weeks, associated with worsening pain.  Admitted for suspected acute bilateral lower extremity cellulitis.  Slowly improving.  Medically ready for DC to SNF pending insurance authorization.  TOC on board.  Continue IV antibiotics while hospitalized with plans to switch over to oral antibiotics at time of discharge.  Complete total 7 days.   Assessment & Plan:  Active Problems:   Cellulitis of bilateral lower extremities   Chronic diastolic CHF (congestive heart failure) (HCC)   Essential hypertension   GERD without esophagitis   Obesity, unspecified   Alcohol  dependence (HCC)   Pulmonary embolism (HCC)   Cellulitis of bilateral lower extremities Unsure if this is recurrent or incomplete resolution from previous episode in January 2025. Complicating underlying chronic lymphedema/venous stasis. Started empirically on IV ceftriaxone  WOC RN input appreciated and agree with their topical wound care recommendations.   Continue current modality pain control including Tylenol , gabapentin , tramadol  as needed.  Difficult to assess objectively severity of her pain but continued pain.  Will increase gabapentin  to 200 Mg 3 times daily. Day 6 of IV ceftriaxone  today, continue while hospitalized with plans  to change to cefuroxime  500 Mg twice daily to complete a 7-day course.  Just to be sure we will check MRSA screen Improving.  Chronic diastolic CHF Compensated Continue prior home dose of Lasix  40 mg daily 2D echo shows LVEF of 60 to 65% without aortic valve stenosis.  Hypokalemia: Replaced.  Magnesium  1.7.  Started low-dose potassium 20 mEq daily along with Lasix  given recurrent nature of her hypokalemia.  Also supplement oral magnesium  oxide.  Will check BMP and magnesium  tomorrow.  Essential hypertension Controlled off of antihypertensives  GERD without esophagitis Continue Protonix   History of pulmonary embolism Continue apixaban  anticoagulation  Alcohol  use disorder No overt withdrawal noted  Body mass index is 34.18 kg/m./Obesity Complicates care.  Outpatient follow-up.  DVT prophylaxis: SCDs Start: 04/21/23 2312     Code Status: Full Code:  Family Communication: Advertising Account Executive via phone. Disposition:  Medically ready for DC pending SNF insurance authorization.  Per TOC, insurance remains pending.     Consultants:     Procedures:     Antimicrobials:   IV ceftriaxone    Subjective:  Patient in some pain while removing bilateral leg dressings.  As daily, asking when she can go home.  Objective:   Vitals:   04/25/23 1530 04/25/23 2131 04/26/23 0540 04/26/23 0818  BP: 114/73 118/77 123/81 98/82  Pulse: 86 93 97 97  Resp: 16 18 17 17   Temp: 97.7 F (36.5 C) 97.8 F (36.6 C) 98.2 F (36.8 C) 98.1 F (36.7 C)  TempSrc: Oral Oral Oral   SpO2: 90% 90% 91% (!) 88%  Weight:      Height:        General exam: Elderly female, moderately built and obese lying  comfortably supine in bed without distress.  Appears to be in good spirits. Respiratory system: Clear to auscultation. Respiratory effort normal. Cardiovascular system: S1 & S2 heard, RRR. No JVD, murmurs, rubs, gallops or clicks. No pedal edema.  Not on telemetry. Gastrointestinal system: Abdomen is  nondistended, soft and nontender. No organomegaly or masses felt. Normal bowel sounds heard. Central nervous system: Alert and oriented. No focal neurological deficits. Extremities: Symmetric 5 x 5 power.  Removed bilateral leg dressings 2/9.  Cellulitis in bilateral legs seems to have almost resolved.  Now has chronic scabbing areas which are also improving compared to before.  Please refer to pictures below. Skin: No rashes, lesions or ulcers Psychiatry: Judgement and insight appear impaired. Mood & affect appropriate.   Pictures of her legs on admission 2//25.     Picture of both legs from 2/9   Data Reviewed:   I have personally reviewed following labs and imaging studies   CBC: Recent Labs  Lab 04/21/23 1219 04/22/23 0623  WBC 7.8 5.6  HGB 13.7 12.7  HCT 41.6 39.2  MCV 87.4 88.9  PLT 262 222    Basic Metabolic Panel: Recent Labs  Lab 04/21/23 1219 04/22/23 0623 04/23/23 0647 04/24/23 0502  NA 139 141 138 137  K 3.7 3.4* 2.9* 3.9  CL 100 103 101 98  CO2 27 31 30  32  GLUCOSE 122* 111* 116* 118*  BUN 10 10 8 9   CREATININE 0.83 0.99 1.00 0.81  CALCIUM  8.7* 8.2* 7.7* 7.8*  MG  --   --  1.7  --     Liver Function Tests: Recent Labs  Lab 04/21/23 1219  AST 29  ALT 17  ALKPHOS 86  BILITOT 0.7  PROT 6.5  ALBUMIN  2.9*    CBG: No results for input(s): GLUCAP in the last 168 hours.  Microbiology Studies:   Recent Results (from the past 240 hours)  Resp panel by RT-PCR (RSV, Flu A&B, Covid) Anterior Nasal Swab     Status: None   Collection Time: 04/21/23 12:47 PM   Specimen: Anterior Nasal Swab  Result Value Ref Range Status   SARS Coronavirus 2 by RT PCR NEGATIVE NEGATIVE Final   Influenza A by PCR NEGATIVE NEGATIVE Final   Influenza B by PCR NEGATIVE NEGATIVE Final    Comment: (NOTE) The Xpert Xpress SARS-CoV-2/FLU/RSV plus assay is intended as an aid in the diagnosis of influenza from Nasopharyngeal swab specimens and should not be used as a  sole basis for treatment. Nasal washings and aspirates are unacceptable for Xpert Xpress SARS-CoV-2/FLU/RSV testing.  Fact Sheet for Patients: bloggercourse.com  Fact Sheet for Healthcare Providers: seriousbroker.it  This test is not yet approved or cleared by the [place]  FDA and has been authorized for detection and/or diagnosis of SARS-CoV-2 by FDA under an Emergency Use Authorization (EUA). This EUA will remain in effect (meaning this test can be used) for the duration of the COVID-19 declaration under Section 564(b)(1) of the Act, 21 U.S.C. section 360bbb-3(b)(1), unless the authorization is terminated or revoked.     Resp Syncytial Virus by PCR NEGATIVE NEGATIVE Final    Comment: (NOTE) Fact Sheet for Patients: bloggercourse.com  Fact Sheet for Healthcare Providers: seriousbroker.it  This test is not yet approved or cleared by the [place]  FDA and has been authorized for detection and/or diagnosis of SARS-CoV-2 by FDA under an Emergency Use Authorization (EUA). This EUA will remain in effect (meaning this test can be used) for the duration of the COVID-19  declaration under Section 564(b)(1) of the Act, 21 U.S.C. section 360bbb-3(b)(1), unless the authorization is terminated or revoked.  Performed at [facility], [address], [place], [place] [address]     Radiology Studies:  No results found.   Scheduled Meds:    apixaban   5 mg Oral BID   furosemide   40 mg Oral Daily   gabapentin   100 mg Oral TID   pantoprazole   40 mg Oral Daily   potassium chloride   20 mEq Oral Daily   pramipexole   1 mg Oral QPM    Continuous Infusions:    cefTRIAXone  (ROCEPHIN )  IV Stopped (04/26/23 0006)     LOS: 4 days     Trenda Mar, MD,  FACP, [facility], [facility], [facility]   [facility] Ad[facility] [place]      To contact the  attending provider between 7A-7P or the covering provider during after hours 7P-7A, please log into the web site [email or web address] and access using universal [place] password for that web site. If you do not have the password, please call the hospital operator.  04/26/2023, 2:36 PM

## 2023-04-26 NOTE — Progress Notes (Signed)
 Bilateral lower leg wound care completed per orders. Pt tolerated fair.

## 2023-04-26 NOTE — Plan of Care (Signed)
 Pt refusing to turn for repositioning. Education provided. Pt allowed this RN with assistance to pull her up in the bed. Pt became agitated when performing a physical assessment and refused to all this RN to check to make sure pt was clean, Pt educated on hygiene.

## 2023-04-26 NOTE — TOC Progression Note (Signed)
 Transition of Care [facility]) - Progression Note    Patient Details  Name: Denise Kelly MRN: [id number] Date of Birth: Nov 15, 1940  Transition of Care [facility]) CM/SW Contact  Dino CHRISTELLA Au, LCSWA Phone Number: 04/26/2023, 9:59 AM  Clinical Narrative:     Shara still pending.  Expected Discharge Plan: Skilled Nursing Facility Barriers to Discharge: Continued Medical Work up  Expected Discharge Plan and Services In-house Referral: Clinical Social Work Discharge Planning Services: NA   Living arrangements for the past 2 months: Independent Living Facility                 DME Arranged: N/A DME Agency: NA       HH Arranged: NA HH Agency: NA         Social Determinants of Health (SDOH) Interventions SDOH Screenings   Food Insecurity: No Food Insecurity (04/21/2023)  Housing: Low Risk  (04/21/2023)  Transportation Needs: No Transportation Needs (04/21/2023)  Utilities: Not At Risk (04/21/2023)  Depression (PHQ2-9): Low Risk  (01/09/2019)  Social Connections: Patient Declined (04/21/2023)  Tobacco Use: Medium Risk (04/21/2023)    Readmission Risk Interventions    04/08/2023    1:02 PM 04/06/2023    3:38 PM 05/02/2022   11:19 AM  Readmission Risk Prevention Plan  Transportation Screening Complete Complete Complete  PCP or Specialist Appt within 3-5 Days Complete Complete   HRI or Home Care Consult Complete Complete   Social Work Consult for Recovery Care Planning/Counseling Complete Complete   Palliative Care Screening Complete Not Applicable   Medication Review Oceanographer) Complete Complete Complete  PCP or Specialist appointment within 3-5 days of discharge   Complete  HRI or Home Care Consult   Complete  SW Recovery Care/Counseling Consult   Complete  Palliative Care Screening   Not Applicable  Skilled Nursing Facility   Complete

## 2023-04-26 NOTE — Plan of Care (Signed)
  Problem: Clinical Measurements: Goal: Ability to maintain clinical measurements within normal limits will improve Outcome: Progressing Goal: Will remain free from infection Outcome: Progressing Goal: Diagnostic test results will improve Outcome: Progressing Goal: Respiratory complications will improve Outcome: Progressing Goal: Cardiovascular complication will be avoided Outcome: Progressing   Problem: Nutrition: Goal: Adequate nutrition will be maintained Outcome: Progressing   Problem: Elimination: Goal: Will not experience complications related to urinary retention Outcome: Progressing   Problem: Safety: Goal: Ability to remain free from injury will improve Outcome: Progressing

## 2023-04-26 NOTE — Plan of Care (Signed)
  Problem: Education: Goal: Knowledge of General Education information will improve Description: Including pain rating scale, medication(s)/side effects and non-pharmacologic comfort measures 04/26/2023 1646 by Geroge Leita NOVAK, RN Outcome: Progressing 04/26/2023 1646 by Geroge Leita NOVAK, RN Outcome: Progressing   Problem: Health Behavior/Discharge Planning: Goal: Ability to manage health-related needs will improve 04/26/2023 1646 by Geroge Leita NOVAK, RN Outcome: Progressing 04/26/2023 1646 by Geroge Leita NOVAK, RN Outcome: Progressing   Problem: Clinical Measurements: Goal: Ability to maintain clinical measurements within normal limits will improve 04/26/2023 1646 by Geroge Leita NOVAK, RN Outcome: Progressing 04/26/2023 1646 by Geroge Leita NOVAK, RN Outcome: Progressing Goal: Will remain free from infection 04/26/2023 1646 by Geroge Leita NOVAK, RN Outcome: Progressing 04/26/2023 1646 by Geroge Leita NOVAK, RN Outcome: Progressing Goal: Diagnostic test results will improve 04/26/2023 1646 by Geroge Leita NOVAK, RN Outcome: Progressing 04/26/2023 1646 by Geroge Leita NOVAK, RN Outcome: Progressing Goal: Respiratory complications will improve 04/26/2023 1646 by Geroge Leita NOVAK, RN Outcome: Progressing 04/26/2023 1646 by Geroge Leita NOVAK, RN Outcome: Progressing Goal: Cardiovascular complication will be avoided 04/26/2023 1646 by Geroge Leita NOVAK, RN Outcome: Progressing 04/26/2023 1646 by Geroge Leita NOVAK, RN Outcome: Progressing   Problem: Activity: Goal: Risk for activity intolerance will decrease 04/26/2023 1646 by Geroge Leita NOVAK, RN Outcome: Progressing 04/26/2023 1646 by Geroge Leita NOVAK, RN Outcome: Progressing   Problem: Nutrition: Goal: Adequate nutrition will be maintained 04/26/2023 1646 by Geroge Leita NOVAK, RN Outcome: Progressing 04/26/2023 1646 by Geroge Leita NOVAK, RN Outcome: Progressing   Problem: Coping: Goal: Level of anxiety will decrease 04/26/2023 1646 by Geroge Leita NOVAK, RN Outcome: Progressing 04/26/2023 1646 by Geroge Leita NOVAK, RN Outcome: Progressing    Problem: Elimination: Goal: Will not experience complications related to bowel motility 04/26/2023 1646 by Geroge Leita NOVAK, RN Outcome: Progressing 04/26/2023 1646 by Geroge Leita NOVAK, RN Outcome: Progressing Goal: Will not experience complications related to urinary retention 04/26/2023 1646 by Geroge Leita NOVAK, RN Outcome: Progressing 04/26/2023 1646 by Geroge Leita NOVAK, RN Outcome: Progressing   Problem: Pain Managment: Goal: General experience of comfort will improve and/or be controlled 04/26/2023 1646 by Geroge Leita NOVAK, RN Outcome: Progressing 04/26/2023 1646 by Geroge Leita NOVAK, RN Outcome: Progressing   Problem: Safety: Goal: Ability to remain free from injury will improve 04/26/2023 1646 by Geroge Leita NOVAK, RN Outcome: Progressing 04/26/2023 1646 by Geroge Leita NOVAK, RN Outcome: Progressing   Problem: Skin Integrity: Goal: Risk for impaired skin integrity will decrease 04/26/2023 1646 by Geroge Leita NOVAK, RN Outcome: Progressing 04/26/2023 1646 by Geroge Leita NOVAK, RN Outcome: Progressing

## 2023-04-27 DIAGNOSIS — E876 Hypokalemia: Secondary | ICD-10-CM | POA: Diagnosis not present

## 2023-04-27 DIAGNOSIS — L03119 Cellulitis of unspecified part of limb: Secondary | ICD-10-CM | POA: Diagnosis not present

## 2023-04-27 LAB — BASIC METABOLIC PANEL
Anion gap: 9 (ref 5–15)
BUN: 10 mg/dL (ref 8–23)
CO2: 34 mmol/L — ABNORMAL HIGH (ref 22–32)
Calcium: 8.5 mg/dL — ABNORMAL LOW (ref 8.9–10.3)
Chloride: 92 mmol/L — ABNORMAL LOW (ref 98–111)
Creatinine, Ser: 0.81 mg/dL (ref 0.44–1.00)
GFR, Estimated: >60 mL/min (ref >60)
Glucose, Bld: 163 mg/dL — ABNORMAL HIGH (ref 70–99)
Potassium: 3.3 mmol/L — ABNORMAL LOW (ref 3.5–5.1)
Sodium: 135 mmol/L (ref 135–145)

## 2023-04-27 LAB — MAGNESIUM: Magnesium: 1.8 mg/dL (ref 1.7–2.4)

## 2023-04-27 MED ORDER — POTASSIUM CHLORIDE CRYS ER 20 MEQ PO TBCR
40.0000 meq | EXTENDED_RELEASE_TABLET | Freq: Once | ORAL | Status: AC
  Administered 2023-04-27: 40 meq via ORAL
  Filled 2023-04-27: qty 2

## 2023-04-27 MED ORDER — ACETAMINOPHEN 500 MG PO TABS
1000.0000 mg | ORAL_TABLET | Freq: Three times a day (TID) | ORAL | Status: DC
  Administered 2023-04-27 – 2023-04-28 (×4): 1000 mg via ORAL
  Filled 2023-04-27 (×4): qty 2

## 2023-04-27 MED ORDER — POTASSIUM CHLORIDE CRYS ER 20 MEQ PO TBCR
40.0000 meq | EXTENDED_RELEASE_TABLET | Freq: Every day | ORAL | Status: DC
  Administered 2023-04-28: 40 meq via ORAL
  Filled 2023-04-27: qty 2

## 2023-04-27 NOTE — Plan of Care (Signed)
  Problem: Clinical Measurements: Goal: Ability to maintain clinical measurements within normal limits will improve Outcome: Progressing Goal: Will remain free from infection Outcome: Progressing Goal: Diagnostic test results will improve Outcome: Progressing Goal: Respiratory complications will improve Outcome: Progressing Goal: Cardiovascular complication will be avoided Outcome: Progressing   Problem: Nutrition: Goal: Adequate nutrition will be maintained Outcome: Progressing   Problem: Elimination: Goal: Will not experience complications related to urinary retention Outcome: Progressing   Problem: Safety: Goal: Ability to remain free from injury will improve Outcome: Progressing

## 2023-04-27 NOTE — TOC Progression Note (Signed)
 Transition of Care [facility]) - Progression Note    Patient Details  Name: Denise Kelly MRN: [id number] Date of Birth: 03-Feb-1941  [facility]) CM/SW Contact  Sherial Dimes, LCSW Phone Number: 04/27/2023, 10:48 AM  Clinical Narrative:     CSW followed up with facility and was advised they would continue to check on authorization and update if it is received. TOC will continue to follow.   Expected Discharge Plan: Skilled Nursing Facility Barriers to Discharge: Continued Medical Work up  Expected Discharge Plan and Services In-house Referral: Clinical Social Work Discharge Planning Services: NA   Living arrangements for the past 2 months: Independent Living Facility                 DME Arranged: N/A DME Agency: NA       HH Arranged: NA HH Agency: NA         Social Determinants of Health (SDOH) Interventions SDOH Screenings   Food Insecurity: No Food Insecurity (04/21/2023)  Housing: Low Risk  (04/21/2023)  Transportation Needs: No Transportation Needs (04/21/2023)  Utilities: Not At Risk (04/21/2023)  Depression (PHQ2-9): Low Risk  (01/09/2019)  Social Connections: Patient Declined (04/21/2023)  Tobacco Use: Medium Risk (04/21/2023)    Readmission Risk Interventions    04/08/2023    1:02 PM 04/06/2023    3:38 PM 05/02/2022   11:19 AM  Readmission Risk Prevention Plan  Transportation Screening Complete Complete Complete  PCP or Specialist Appt within 3-5 Days Complete Complete   HRI or Home Care Consult Complete Complete   Social Work Consult for Recovery Care Planning/Counseling Complete Complete   Palliative Care Screening Complete Not Applicable   Medication Review Oceanographer) Complete Complete Complete  PCP or Specialist appointment within 3-5 days of discharge   Complete  HRI or Home Care Consult   Complete  SW Recovery Care/Counseling Consult   Complete  Palliative Care Screening   Not Applicable  Skilled Nursing Facility   Complete

## 2023-04-27 NOTE — Progress Notes (Signed)
 Pt cursing and screaming at RN and NA when trying to change  bed linen when she was wet. She refused care and stated "please dont touch me". She told the RN and NA to shut up and that we were sons of bitches.

## 2023-04-27 NOTE — Progress Notes (Signed)
 PROGRESS NOTE   Denise Kelly  UEA:[id number]    DOB: [birth year]    DOA: 04/21/2023  PCP: Pcp, No   I have briefly reviewed patients previous medical records in [facility].  Chief Complaint  Patient presents with   Weakness    Brief Hospital Course:  83 year old female, reportedly lives alone, ambulates with a walker, medical history significant for alcohol  use disorder, anxiety, depression, GERD, hypertension, pulmonary embolism on apixaban , hard of hearing, recent hospitalization 1/17 - 1/22 for UTI and bilateral lower extremity cellulitis when she was discharged home on cefadroxil , presented to ED on 04/21/2023 with complaints of progressively worsening bilateral lower extremity rash x 4 weeks, associated with worsening pain.  Admitted for suspected acute bilateral lower extremity cellulitis.  Slowly improving.  Medically ready for DC to SNF pending insurance authorization.  TOC on board.  Continue IV antibiotics while hospitalized with plans to switch over to oral antibiotics at time of discharge.  Completes total 7 days today.  Have been waiting for insurance approval to SNF since 2/7.  Per TOC, remains pending.   Assessment & Plan:  Active Problems:   Cellulitis of bilateral lower extremities   Chronic diastolic CHF (congestive heart failure) (HCC)   Essential hypertension   GERD without esophagitis   Obesity, unspecified   Alcohol  dependence (HCC)   Pulmonary embolism (HCC)   Cellulitis of bilateral lower extremities Unsure if this is recurrent or incomplete resolution from previous episode in January 2025. Complicating underlying chronic lymphedema/venous stasis. Completes 7 days of IV ceftriaxone  on 2/10. WOC RN input appreciated and agree with their topical wound care recommendations.   Continue current modality pain control including Tylenol , gabapentin  (increased dose this admission), tramadol  as needed.   MRSA PCR screen negative.   Improved.  Chronic diastolic  CHF Compensated Continue prior home dose of Lasix  40 mg daily 2D echo shows LVEF of 60 to 65% without aortic valve stenosis.  Hypokalemia: Recurrent issue this admission.  Magnesium  1.8.  Increase daily K-Dur to 40 mEq daily.  Follow BMP.  Essential hypertension Controlled off of antihypertensives  GERD without esophagitis Continue Protonix   History of pulmonary embolism Continue apixaban  anticoagulation  Alcohol  use disorder No overt withdrawal noted  Body mass index is 34.18 kg/m./Obesity Complicates care.  Outpatient follow-up.  DVT prophylaxis: SCDs Start: 04/21/23 2312     Code Status: Full Code:  Family Communication: None at bedside. Disposition:  Medically ready for DC pending SNF insurance authorization.  Per TOC, insurance remains pending.     Consultants:     Procedures:     Antimicrobials:   IV ceftriaxone    Subjective:  No new complaints.  Asking to go home.  Aware from discussion with her healthcare agent that she knows that she will be going [facility] prior to going home.  Pain in legs.  Objective:   Vitals:   04/26/23 2132 04/27/23 0540 04/27/23 0752 04/27/23 1543  BP: 123/68 100/83 116/61 117/64  Pulse: 85 83 72 80  Resp: 18 18 16 16   Temp: 98 F (36.7 C)  97.6 F (36.4 C) 97.7 F (36.5 C)  TempSrc: Oral  Oral Oral  SpO2: 96% (!) 89% 94% 94%  Weight:      Height:        General exam: Elderly female, moderately built and obese lying comfortably supine in bed without distress.  Appears to be in good spirits. Respiratory system: Clear to auscultation. Respiratory effort normal. Cardiovascular system: S1 & S2  heard, RRR. No JVD, murmurs, rubs, gallops or clicks. No pedal edema.  Not on telemetry. Gastrointestinal system: Abdomen is nondistended, soft and nontender. No organomegaly or masses felt. Normal bowel sounds heard. Central nervous system: Alert and oriented. No focal neurological deficits. Extremities: Symmetric 5 x 5 power.   Removed bilateral leg dressings 2/9.  Cellulitis in bilateral legs seems to have almost resolved.  Now has chronic scabbing areas which are also improving compared to before.  Please refer to pictures below. Skin: No rashes, lesions or ulcers Psychiatry: Judgement and insight appear impaired. Mood & affect appropriate.   Pictures of her legs on admission 04/21/23.     Picture of both legs from 2/9   Data Reviewed:   I have personally reviewed following labs and imaging studies   CBC: Recent Labs  Lab 04/21/23 1219 04/22/23 0623  WBC 7.8 5.6  HGB 13.7 12.7  HCT 41.6 39.2  MCV 87.4 88.9  PLT 262 222    Basic Metabolic Panel: Recent Labs  Lab 04/21/23 1219 04/22/23 0623 04/23/23 0647 04/24/23 0502 04/27/23 0908  NA 139 141 138 137 135  K 3.7 3.4* 2.9* 3.9 3.3*  CL 100 103 101 98 92*  CO2 27 31 30  32 34*  GLUCOSE 122* 111* 116* 118* 163*  BUN 10 10 8 9 10   CREATININE 0.83 0.99 1.00 0.81 0.81  CALCIUM  8.7* 8.2* 7.7* 7.8* 8.5*  MG  --   --  1.7  --  1.8    Liver Function Tests: Recent Labs  Lab 04/21/23 1219  AST 29  ALT 17  ALKPHOS 86  BILITOT 0.7  PROT 6.5  ALBUMIN  2.9*    CBG: No results for input(s): "GLUCAP" in the last 168 hours.  Microbiology Studies:   Recent Results (from the past 240 hours)  Resp panel by RT-PCR (RSV, Flu A&B, Covid) Anterior Nasal Swab     Status: None   Collection Time: 04/21/23 12:47 PM   Specimen: Anterior Nasal Swab  Result Value Ref Range Status   SARS Coronavirus 2 by RT PCR NEGATIVE NEGATIVE Final   Influenza A by PCR NEGATIVE NEGATIVE Final   Influenza B by PCR NEGATIVE NEGATIVE Final    Comment: (NOTE) The Xpert Xpress SARS-CoV-2/FLU/RSV plus assay is intended as an aid in the diagnosis of influenza from Nasopharyngeal swab specimens and should not be used as a sole basis for treatment. Nasal washings and aspirates are unacceptable for Xpert Xpress SARS-CoV-2/FLU/RSV testing.  Fact Sheet for  Patients: BloggerCourse.com  Fact Sheet for Healthcare Providers: SeriousBroker.it  This test is not yet approved or cleared by the [place]  FDA and has been authorized for detection and/or diagnosis of SARS-CoV-2 by FDA under an Emergency Use Authorization (EUA). This EUA will remain in effect (meaning this test can be used) for the duration of the COVID-19 declaration under Section 564(b)(1) of the Act, 21 U.S.C. section 360bbb-3(b)(1), unless the authorization is terminated or revoked.     Resp Syncytial Virus by PCR NEGATIVE NEGATIVE Final    Comment: (NOTE) Fact Sheet for Patients: BloggerCourse.com  Fact Sheet for Healthcare Providers: SeriousBroker.it  This test is not yet approved or cleared by the [place]  FDA and has been authorized for detection and/or diagnosis of SARS-CoV-2 by FDA under an Emergency Use Authorization (EUA). This EUA will remain in effect (meaning this test can be used) for the duration of the COVID-19 declaration under Section 564(b)(1) of the Act, 21 U.S.C. section 360bbb-3(b)(1), unless the authorization is  terminated or revoked.  Performed at [facility], [address] [place], [place] [address]   MRSA Next Gen by PCR, Nasal     Status: None   Collection Time: 04/26/23  3:31 PM   Specimen: Nasal Mucosa; Nasal Swab  Result Value Ref Range Status   MRSA by PCR Next Gen NOT DETECTED NOT DETECTED Final    Comment: (NOTE) The GeneXpert MRSA Assay (FDA approved for NASAL specimens only), is one component of a comprehensive MRSA colonization surveillance program. It is not intended to diagnose MRSA infection nor to guide or monitor treatment for MRSA infections. Test performance is not FDA approved in patients less than 96 years old. Performed at [facility], [address], [place], [place] [address]     Radiology  Studies:  No results found.   Scheduled Meds:    apixaban   5 mg Oral BID   furosemide   40 mg Oral Daily   gabapentin   200 mg Oral TID   pantoprazole   40 mg Oral Daily   potassium chloride   20 mEq Oral Daily   pramipexole   1 mg Oral QPM    Continuous Infusions:    cefTRIAXone  (ROCEPHIN )  IV 1 g (04/27/23 0008)     LOS: 5 days     Aubrey Blas, MD,  FACP, [facility], [facility], [facility]   [facility] [place]      To contact the attending provider between 7A-7P or the covering provider during after hours 7P-7A, please log into the web site [email or web address] and access using universal [place] password for that web site. If you do not have the password, please call the hospital operator.  04/27/2023, 5:01 PM

## 2023-04-27 NOTE — Plan of Care (Signed)
 No orders for BLE dressing change, MD Woodroe Hazel notifed, as per MD to place wound care orders.  RN tried to undress current dressing and refused.   Problem: Education: Goal: Knowledge of General Education information will improve Description: Including pain rating scale, medication(s)/side effects and non-pharmacologic comfort measures Outcome: Progressing   Problem: Health Behavior/Discharge Planning: Goal: Ability to manage health-related needs will improve Outcome: Progressing   Problem: Clinical Measurements: Goal: Ability to maintain clinical measurements within normal limits will improve Outcome: Progressing Goal: Will remain free from infection Outcome: Progressing Goal: Diagnostic test results will improve Outcome: Progressing Goal: Respiratory complications will improve Outcome: Progressing Goal: Cardiovascular complication will be avoided Outcome: Progressing

## 2023-04-28 DIAGNOSIS — E876 Hypokalemia: Secondary | ICD-10-CM | POA: Diagnosis not present

## 2023-04-28 DIAGNOSIS — L0390 Cellulitis, unspecified: Secondary | ICD-10-CM | POA: Diagnosis not present

## 2023-04-28 LAB — BASIC METABOLIC PANEL
Anion gap: 13 (ref 5–15)
BUN: 11 mg/dL (ref 8–23)
CO2: 31 mmol/L (ref 22–32)
Calcium: 8.6 mg/dL — ABNORMAL LOW (ref 8.9–10.3)
Chloride: 94 mmol/L — ABNORMAL LOW (ref 98–111)
Creatinine, Ser: 0.7 mg/dL (ref 0.44–1.00)
GFR, Estimated: >60 mL/min (ref >60)
Glucose, Bld: 114 mg/dL — ABNORMAL HIGH (ref 70–99)
Potassium: 3.5 mmol/L (ref 3.5–5.1)
Sodium: 138 mmol/L (ref 135–145)

## 2023-04-28 LAB — GLUCOSE, CAPILLARY: Glucose-Capillary: 113 mg/dL — ABNORMAL HIGH (ref 70–99)

## 2023-04-28 MED ORDER — POTASSIUM CHLORIDE CRYS ER 10 MEQ PO TBCR: 10.0000 meq | EXTENDED_RELEASE_TABLET | Freq: Every day | ORAL | Status: AC

## 2023-04-28 MED ORDER — ACETAMINOPHEN ER 650 MG PO TBCR: 1300.0000 mg | EXTENDED_RELEASE_TABLET | Freq: Three times a day (TID) | ORAL | Status: DC

## 2023-04-28 MED ORDER — FUROSEMIDE 20 MG PO TABS: 20.0000 mg | ORAL_TABLET | Freq: Every day | ORAL | Status: AC

## 2023-04-28 MED ORDER — GABAPENTIN 100 MG PO CAPS: 200.0000 mg | ORAL_CAPSULE | Freq: Three times a day (TID) | ORAL | Status: AC

## 2023-04-28 MED ORDER — METHOCARBAMOL 500 MG PO TABS: 500.0000 mg | ORAL_TABLET | Freq: Three times a day (TID) | ORAL | Status: DC | PRN

## 2023-04-28 MED ORDER — TRAMADOL HCL 50 MG PO TABS: 50.0000 mg | ORAL_TABLET | Freq: Four times a day (QID) | ORAL | 0 refills | Status: DC | PRN

## 2023-04-28 NOTE — Discharge Instructions (Signed)

## 2023-04-28 NOTE — Plan of Care (Signed)
  Problem: Clinical Measurements: Goal: Respiratory complications will improve Outcome: Progressing   Problem: Nutrition: Goal: Adequate nutrition will be maintained Outcome: Progressing   Problem: Coping: Goal: Level of anxiety will decrease Outcome: Progressing   Problem: Pain Managment: Goal: General experience of comfort will improve and/or be controlled Outcome: Progressing   Problem: Safety: Goal: Ability to remain free from injury will improve Outcome: Progressing   Problem: Skin Integrity: Goal: Risk for impaired skin integrity will decrease Outcome: Not Progressing

## 2023-04-28 NOTE — Progress Notes (Signed)
Assisted Dr. Waymon Amato with taking off the bilateral LE dressings and he took a picture of the wounds. I cleansed the wounds with saline, applied xeroform, covered with 4x4s, ABD and kerlix. If patient transports today, the nurse can apply bilateral ace wraps for transport for protection. Dressings dated and timed. Ordered Vashe for future dressing changes as per WOC recommendation.

## 2023-04-28 NOTE — Progress Notes (Signed)
Patient refused dressing change to bilateral lower extremities.

## 2023-04-28 NOTE — Discharge Summary (Signed)
 Physician Discharge Summary  TRISA CRANOR ZOX:[id number] DOB: 05-03-1940  PCP: Pcp, No  Admitted from: Home Discharged to: SNF  Admit date: 04/21/2023 Discharge date: 04/28/2023  Recommendations for Outpatient Follow-up:    Contact information for follow-up providers     MD at SNF Follow up.   Why: To be seen in 3 to 4 days with repeat labs (CBC & BMP).  Continue to evaluate and address bilateral leg dressing/wound care as needed.             Contact information for after-discharge care     Destination     [facility] .   Service: Skilled Nursing Contact information: [address] Test Update Address [place] [address] [phone]                      Home Health: None    Equipment/Devices: TBD at [facility]    Discharge Condition: Improved and stable.   Code Status: Full Code Diet recommendation:  Discharge Diet Orders (From admission, onward)     Start     Ordered   04/28/23 0000  Diet - low sodium heart healthy        04/28/23 1456             Discharge Diagnoses:  Active Problems:   Cellulitis of bilateral lower extremities   Chronic diastolic CHF (congestive heart failure) (HCC)   Essential hypertension   GERD without esophagitis   Obesity, unspecified   Alcohol dependence (HCC)   Pulmonary embolism [facility])   Brief Hospital Course:  83 year old female, reportedly lives alone, ambulates with a walker, medical history significant for alcohol use disorder, anxiety, depression, GERD, hypertension, pulmonary embolism on apixaban, hard of hearing, recent hospitalization 1/17 - 1/22 for UTI and bilateral lower extremity cellulitis when she was discharged home on cefadroxil, presented to ED on 04/21/2023 with complaints of progressively worsening bilateral lower extremity rash x 4 weeks, associated with worsening pain.  Admitted for suspected acute bilateral lower extremity cellulitis.  Clinically improved.      Assessment & Plan:    Cellulitis of bilateral lower extremities Unsure if this is recurrent or incomplete resolution from previous episode in January 2025. Complicating underlying chronic lymphedema/venous stasis. Completed 7 days of IV ceftriaxone on 2/10. WOC RN input appreciated and agree with their topical wound care recommendations.  Continue wound care consultation and follow-up at SNF. Continue current modality pain control including Tylenol, gabapentin (increased dose this admission), tramadol and Robaxin as needed.   MRSA PCR screen negative.   Improved.   Chronic diastolic CHF Compensated Patient was not on any diuretics PTA.  Unsure how much of her leg edema is related to lymphedema/venous stasis.  At discharge, placed on reduced dose of Lasix 20 mg daily along with potassium supplements 10 mEq daily.  Periodically follow BMP at SNF. 2D echo shows LVEF of 60 to 65% without aortic valve stenosis.   Hypokalemia: Recurrent issue this admission.  Magnesium 1.8.  Replaced.  Continue low-dose potassium supplements while on Lasix.  Follow BMP periodically at SNF.   Essential hypertension Controlled off of antihypertensives   GERD without esophagitis Not on PPI PTA.   History of pulmonary embolism Continue apixaban anticoagulation   Alcohol use disorder No overt withdrawal noted  ?  Age-related cognitive impairment or dementia Suggest outpatient neurocognitive evaluation, possible referral to Neuropsychiatrist. Appears to be chronic and not new.   Body mass index is 34.18 kg/m./Obesity Complicates care.  Outpatient follow-up.     Consultants:   None   Procedures:       Discharge Instructions  Discharge Instructions     (HEART FAILURE PATIENTS) Call MD:  Anytime you have any of the following symptoms: 1) 3 pound weight gain in 24 hours or 5 pounds in 1 week 2) shortness of breath, with or without a dry hacking cough 3) swelling in the hands, feet or stomach 4)  if you have to sleep on extra pillows at night in order to breathe.   Complete by: As directed    Call MD for:  difficulty breathing, headache or visual disturbances   Complete by: As directed    Call MD for:  extreme fatigue   Complete by: As directed    Call MD for:  persistant dizziness or light-headedness   Complete by: As directed    Call MD for:  persistant nausea and vomiting   Complete by: As directed    Call MD for:  redness, tenderness, or signs of infection (pain, swelling, redness, odor or green/yellow discharge around incision site)   Complete by: As directed    Call MD for:  severe uncontrolled pain   Complete by: As directed    Call MD for:  temperature >100.4   Complete by: As directed    Diet - low sodium heart healthy   Complete by: As directed    Discharge wound care:   Complete by: As directed    Wound care  Daily      Comments: Cleanse BLE wounds with Vashe (lawson#[id number]). Apply Xeroform (change daily) and then cover with ABD pads and Kerlix.   Increase activity slowly   Complete by: As directed         Medication List     STOP taking these medications    cefadroxil 500 MG capsule Commonly known as: DURICEF       TAKE these medications    acetaminophen 650 MG CR tablet Commonly known as: TYLENOL Take 2 tablets (1,300 mg total) by mouth 3 (three) times daily. 1300 mg 3 times daily x 3 to 5 days then recommend changing to 1300 mg 3 times daily as needed for mild pain. What changed:  when to take this reasons to take this additional instructions   cyanocobalamin 1000 MCG tablet Take 1 tablet (1,000 mcg total) by mouth daily.   Eliquis 5 MG Tabs tablet Generic drug: apixaban Take 1 tablet (5 mg total) by mouth 2 (two) times daily.   furosemide 20 MG tablet Commonly known as: LASIX Take 1 tablet (20 mg total) by mouth daily. Start taking on: April 29, 2023   gabapentin 100 MG capsule Commonly known as: NEURONTIN Take 2 capsules (200  mg total) by mouth 3 (three) times daily. What changed: how much to take   hydrOXYzine 10 MG tablet Commonly known as: ATARAX Take 1 tablet (10 mg total) by mouth 3 (three) times daily as needed for itching.   methocarbamol 500 MG tablet Commonly known as: ROBAXIN Take 1 tablet (500 mg total) by mouth every 8 (eight) hours as needed for muscle spasms.   ondansetron 4 MG tablet Commonly known as: ZOFRAN Take 1 tablet (4 mg total) by mouth every 8 (eight) hours as needed for nausea or vomiting.   phenazopyridine 100 MG tablet Commonly known as: PYRIDIUM Take 1 tablet (100 mg total) by mouth 3 (three) times daily as needed (dysurea).   potassium chloride 10 MEQ tablet Commonly known as:  KLOR-CON M Take 1 tablet (10 mEq total) by mouth daily. Start taking on: April 29, 2023   pramipexole 1 MG tablet Commonly known as: MIRAPEX Take 1 tablet (1 mg total) by mouth every evening.   traMADol 50 MG tablet Commonly known as: ULTRAM Take 1 tablet (50 mg total) by mouth every 6 (six) hours as needed for moderate pain (pain score 4-6) or severe pain (pain score 7-10). After 3 to 5 days, recommend tapering to discontinue as soon as able.   traZODone 100 MG tablet Commonly known as: DESYREL Take 1 tablet (100 mg total) by mouth at bedtime as needed for sleep.       Allergies  Allergen Reactions   Sulfa Antibiotics Hives, Swelling and Other (See Comments)    Facial/eye swelling    Coreg [Carvedilol] Other (See Comments)    Memory loss   Motrin [Ibuprofen] Palpitations   Toprol Xl [Metoprolol Tartrate] Cough   Doxycycline Hyclate Other (See Comments)    HEARTBURN   Flovent Hfa [Fluticasone] Itching   Statins Other (See Comments)    MENTAL STATUS CHANGE      Procedures/Studies: ECHOCARDIOGRAM COMPLETE Result Date: 04/22/2023    ECHOCARDIOGRAM REPORT   Patient Name:   ELIZABETH PAULSEN Date of Exam: 04/22/2023 Medical Rec #:  [id number]       Height:       67.0 in Accession #:     [id number]      Weight:       218.3 lb Date of Birth:  24-Jan-1941       BSA:          2.099 m Patient Age:    82 years        BP:           106/69 mmHg Patient Gender: F               HR:           92 bpm. Exam Location:  Inpatient Procedure: 2D Echo, Cardiac Doppler and Color Doppler Indications:    CHF I50.9  History:        Patient has prior history of Echocardiogram examinations, most                 recent 01/06/2018. CHF; Risk Factors:Hypertension.  Sonographer:    Lucendia Herrlich RCS Referring Phys: [phone] Dorrian Doggett D Shardee Dieu IMPRESSIONS  1. Left ventricular ejection fraction, by estimation, is 60 to 65%. The left ventricle has normal function. There is mild left ventricular hypertrophy.  2. Right ventricular systolic function is mildly reduced. The right ventricular size is mildly enlarged.  3. Trivial mitral valve regurgitation.  4. Difficult to see AV leaflets IT appears sclerotic but not stenotic.. The aortic valve was not well visualized. Aortic valve regurgitation is not visualized. Aortic valve sclerosis/calcification is present, without any evidence of aortic stenosis. FINDINGS  Left Ventricle: Left ventricular ejection fraction, by estimation, is 60 to 65%. The left ventricle has normal function. The left ventricular internal cavity size was normal in size. There is mild left ventricular hypertrophy. Right Ventricle: The right ventricular size is mildly enlarged. Right vetricular wall thickness was not assessed. Right ventricular systolic function is mildly reduced. Left Atrium: Left atrial size was normal in size. Right Atrium: Right atrial size was normal in size. Pericardium: There is no evidence of pericardial effusion. Mitral Valve: There is mild thickening of the mitral valve leaflet(s). Mild mitral annular calcification. Trivial mitral valve regurgitation. Tricuspid Valve: The  tricuspid valve is normal in structure. Tricuspid valve regurgitation is trivial. Aortic Valve: Difficult to see AV  leaflets IT appears sclerotic but not stenotic. The aortic valve was not well visualized. Aortic valve regurgitation is not visualized. Aortic valve sclerosis/calcification is present, without any evidence of aortic stenosis. Aortic valve peak gradient measures 10.0 mmHg. Pulmonic Valve: The pulmonic valve was normal in structure. Pulmonic valve regurgitation is not visualized. Aorta: The aortic root and ascending aorta are structurally normal, with no evidence of dilitation. IAS/Shunts: No atrial level shunt detected by color flow Doppler.  LEFT VENTRICLE PLAX 2D LVIDd:         3.70 cm   Diastology LVIDs:         2.60 cm   LV e' medial:    4.79 cm/s LV PW:         1.30 cm   LV E/e' medial:  15.1 LV IVS:        1.20 cm   LV e' lateral:   9.03 cm/s LVOT diam:     2.30 cm   LV E/e' lateral: 8.0 LV SV:         52 LV SV Index:   25 LVOT Area:     4.15 cm  IVC IVC diam: 2.00 cm LEFT ATRIUM           Index        RIGHT ATRIUM          Index LA diam:      2.50 cm 1.19 cm/m   RA Area:     7.60 cm LA Vol (A4C): 51.7 ml 24.63 ml/m  RA Volume:   13.00 ml 6.19 ml/m  AORTIC VALVE AV Area (Vmax): 2.15 cm AV Vmax:        158.00 cm/s AV Peak Grad:   10.0 mmHg LVOT Vmax:      81.67 cm/s LVOT Vmean:     51.833 cm/s LVOT VTI:       0.125 m  AORTA Ao Root diam: 3.20 cm Ao Asc diam:  3.40 cm MITRAL VALVE               TRICUSPID VALVE MV Area (PHT): 4.41 cm    TR Peak grad:   19.7 mmHg MV Decel Time: 172 msec    TR Vmax:        222.00 cm/s MV E velocity: 72.40 cm/s MV A velocity: 73.10 cm/s  SHUNTS MV E/A ratio:  0.99        Systemic VTI:  0.12 m                            Systemic Diam: 2.30 cm Dietrich Pates MD Electronically signed by Dietrich Pates MD Signature Date/Time: 04/22/2023/4:33:57 PM    Final    DG Pelvis Portable Result Date: 04/21/2023 CLINICAL DATA:  Fall. EXAM: PORTABLE PELVIS 1-2 VIEWS COMPARISON:  Pelvis x-ray dated April 30, 2022. FINDINGS: No acute fracture or dislocation. Old healed left proximal femur fracture  status post ORIF. Similar bilateral hip degenerative changes. Osteopenia. Prior L4-L5 fusion. Soft tissues are unremarkable. IMPRESSION: 1. No acute osseous abnormality. Electronically Signed   By: Obie Dredge M.D.   On: 04/21/2023 14:50   DG Chest Portable 1 View Result Date: 04/21/2023 CLINICAL DATA:  Fall. EXAM: PORTABLE CHEST 1 VIEW COMPARISON:  CT chest and chest x-ray dated April 08, 2023. FINDINGS: The heart appears mildly enlarged, although this is likely accentuated  by technique given normal heart size on the prior study 2 weeks ago. Mediastinal contours are otherwise unremarkable. Low lung volumes. No focal consolidation, pleural effusion, or pneumothorax. No acute osseous abnormality. IMPRESSION: 1. Low lung volumes. No acute cardiopulmonary disease. Electronically Signed   By: Obie Dredge M.D.   On: 04/21/2023 14:47    Subjective: Evaluated along with an RN for lower extremity dressing change.  States that her pain is much better controlled now.  No other complaints reported.  Discharge Exam:  Vitals:   04/27/23 2104 04/28/23 0528 04/28/23 0910 04/28/23 1429  BP: 120/67 136/73 138/70 113/67  Pulse: 85 80 88 88  Resp:   16 17  Temp: (!) 97.5 F (36.4 C) 97.6 F (36.4 C) (!) 97.5 F (36.4 C) 98.1 F (36.7 C)  TempSrc: Oral Oral Oral Oral  SpO2: 92% 90% 91% 96%  Weight:      Height:        General exam: Elderly female, moderately built and obese lying comfortably propped up in bed without distress.  Appears to be in good spirits. Respiratory system: Clear to auscultation.  No increased work of breathing. Cardiovascular system: S1 & S2 heard, RRR. No JVD, murmurs, rubs, gallops or clicks. No pedal edema.  Not on telemetry. Gastrointestinal system: Abdomen is nondistended, soft and nontender. No organomegaly or masses felt. Normal bowel sounds heard. Central nervous system: Alert and oriented. No focal neurological deficits. Extremities: Symmetric 5 x 5 power.  Removed  bilateral leg dressings 2/11 to evaluate.  Cellulitis in bilateral legs has resolved.  Now has chronic scabbing areas on anterior shins that have significantly improved compared to on admission as noted in pictures below. Skin: As above Psychiatry: Judgement and insight appear impaired. Mood & affect appropriate.   L leg on 04/21/2023   R leg 04/21/2023   B/L legs on 04/28/2023   The results of significant diagnostics from this hospitalization (including imaging, microbiology, ancillary and laboratory) are listed below for reference.     Microbiology: Recent Results (from the past 240 hours)  Resp panel by RT-PCR (RSV, Flu A&B, Covid) Anterior Nasal Swab     Status: None   Collection Time: 04/21/23 12:47 PM   Specimen: Anterior Nasal Swab  Result Value Ref Range Status   SARS Coronavirus 2 by RT PCR NEGATIVE NEGATIVE Final   Influenza A by PCR NEGATIVE NEGATIVE Final   Influenza B by PCR NEGATIVE NEGATIVE Final    Comment: (NOTE) The Xpert Xpress SARS-CoV-2/FLU/RSV plus assay is intended as an aid in the diagnosis of influenza from Nasopharyngeal swab specimens and should not be used as a sole basis for treatment. Nasal washings and aspirates are unacceptable for Xpert Xpress SARS-CoV-2/FLU/RSV testing.  Fact Sheet for Patients: BloggerCourse.com  Fact Sheet for Healthcare Providers: SeriousBroker.it  This test is not yet approved or cleared by the [place] FDA and has been authorized for detection and/or diagnosis of SARS-CoV-2 by FDA under an Emergency Use Authorization (EUA). This EUA will remain in effect (meaning this test can be used) for the duration of the COVID-19 declaration under Section 564(b)(1) of the Act, 21 U.S.C. section 360bbb-3(b)(1), unless the authorization is terminated or revoked.     Resp Syncytial Virus by PCR NEGATIVE NEGATIVE Final    Comment: (NOTE) Fact Sheet for  Patients: BloggerCourse.com  Fact Sheet for Healthcare Providers: SeriousBroker.it  This test is not yet approved or cleared by the [place] FDA and has been authorized for detection and/or diagnosis of  SARS-CoV-2 by FDA under an Emergency Use Authorization (EUA). This EUA will remain in effect (meaning this test can be used) for the duration of the COVID-19 declaration under Section 564(b)(1) of the Act, 21 U.S.C. section 360bbb-3(b)(1), unless the authorization is terminated or revoked.  Performed at [facility], [address], [place], [place] [address]   MRSA Next Gen by PCR, Nasal     Status: None   Collection Time: 04/26/23  3:31 PM   Specimen: Nasal Mucosa; Nasal Swab  Result Value Ref Range Status   MRSA by PCR Next Gen NOT DETECTED NOT DETECTED Final    Comment: (NOTE) The GeneXpert MRSA Assay (FDA approved for NASAL specimens only), is one component of a comprehensive MRSA colonization surveillance program. It is not intended to diagnose MRSA infection nor to guide or monitor treatment for MRSA infections. Test performance is not FDA approved in patients less than 65 years old. Performed at [facility], [address], [place], [place] [address]      Labs: CBC: Recent Labs  Lab 04/22/23 0623  WBC 5.6  HGB 12.7  HCT 39.2  MCV 88.9  PLT 222    Basic Metabolic Panel: Recent Labs  Lab 04/22/23 0623 04/23/23 0647 04/24/23 0502 04/27/23 0908 04/28/23 0605  NA 141 138 137 135 138  K 3.4* 2.9* 3.9 3.3* 3.5  CL 103 101 98 92* 94*  CO2 31 30 32 34* 31  GLUCOSE 111* 116* 118* 163* 114*  BUN 10 8 9 10 11   CREATININE 0.99 1.00 0.81 0.81 0.70  CALCIUM 8.2* 7.7* 7.8* 8.5* 8.6*  MG  --  1.7  --  1.8  --       Urinalysis    Component Value Date/Time   COLORURINE YELLOW 04/23/2023 0213   APPEARANCEUR CLEAR 04/23/2023 0213   APPEARANCEUR Clear 09/19/2016 1633   LABSPEC 1.011 04/23/2023  0213   PHURINE 5.0 04/23/2023 0213   GLUCOSEU NEGATIVE 04/23/2023 0213   HGBUR SMALL (A) 04/23/2023 0213   BILIRUBINUR NEGATIVE 04/23/2023 0213   BILIRUBINUR Negative 09/19/2016 1633   KETONESUR NEGATIVE 04/23/2023 0213   PROTEINUR NEGATIVE 04/23/2023 0213   UROBILINOGEN 1.0 11/30/2014 2105   NITRITE NEGATIVE 04/23/2023 0213   LEUKOCYTESUR TRACE (A) 04/23/2023 0213      Time coordinating discharge: 45 minutes  SIGNED:  Marcellus Scott, MD,  FACP, [facility], [facility], [facility]   [facility] [place]     To contact the attending provider between 7A-7P or the covering provider during after hours 7P-7A, please log into the web site [email or web address] and access using universal [place] password for that web site. If you do not have the password, please call the hospital operator.

## 2023-04-28 NOTE — Progress Notes (Addendum)
Physical Therapy Treatment Patient Details Name: Denise Kelly MRN: [id number] DOB: 1940/03/29 Today's Date: 04/28/2023   History of Present Illness 83 y.o. female presents to [facility] 04/21/23 with worsening LE rash for 4 weeks. Admitted with recurrent B LE cellulitis and chronic lymphedema. Recent admit 1/17 and 1/22 for UTI and BLE cellulitis. PMHx: alcohol abuse, anxiety/depression, GERD, HTN, HOH, obesity, chronic diastolic CHF, PE.    PT Comments  Pt received in supine, anxious but agreeable to therapy session with heavy encouragement, pt HoH but expressed willingness to work on transfer training with long term goal of being able to return to ILF so she can care for her cat. Pt needing up to +2 modA for transfers from elevated bed height<>Stedy platform lift x2 trials and totalA for transfer toward [facility] in lift as she was too fearful of falls and in too much BLE pain to attempt sidesteps toward [facility] with RW. Pt maintains BLE plantarflexion throughout session, she may benefit from orthotist consult post-acute to see if prosthetics would help with balance to decrease her fall risk. Pt does not have regular shoes with her in hospital, would need family to bring them in prior to this consult. Pt needing up to maxA +2 for bed mobility as she fatigues and BLE become more painful to get back into the bed. Recommend air bed for her due to pt very limited mobility and inability to relieve her own pressure, pt is at high risk of development of bed sores and currently refusing to remain OOB in chair, care team notified. Pt will continue to benefit from skilled rehab in a post acute setting < 3 hours per day to maximize functional gains before returning home, DME updated below per pt progress and discussed with supervising PT Doreene Nest.     If plan is discharge home, recommend the following: A lot of help with bathing/dressing/bathroom;Assist for transportation;Help with stairs or ramp for entrance;Assistance with  cooking/housework;Two people to help with walking and/or transfers;Supervision due to cognitive status;Direct supervision/assist for medications management   Can travel by private vehicle     No  Equipment Recommendations  None recommended by PT;Other (comment);Wheelchair (measurements PT);Wheelchair cushion (measurements PT);Hospital bed;Hoyer lift (currently pt needing hoyer and wheelchair and hospital bed in order to DC home)    Recommendations for Other Services Other (comment) (psych consult to address anxiety surrounding mobility)     Precautions / Restrictions Precautions Precautions: Fall Recall of Precautions/Restrictions: Impaired Precaution/Restrictions Comments: BLE cellulitis, skin breakdown on back/groin also due to immobility Restrictions Weight Bearing Restrictions Per Provider Order: No     Mobility  Bed Mobility Overal bed mobility: Needs Assistance Bed Mobility: Rolling, Sit to Sidelying, Supine to Sit     Supine to sit: Min assist, HOB elevated, Used rails, +2 for physical assistance   Sit to sidelying: Max assist, +2 for safety/equipment, Used rails General bed mobility comments: Dense cues for safety/sequencing and initiation, pt able to initiate moving limbs toward L EOB with increased time and encouragement, needs trunk support more due to fear of falls when pushing away from mattress to sit up, heavy reliance on bed rail. Increased BLE assist returning to supine due to fatigue and BLE feeling too heavy/too much in pain.    Transfers Overall transfer level: Needs assistance Equipment used: Ambulation equipment used Transfers: Sit to/from Stand, Bed to chair/wheelchair/BSC Sit to Stand: Mod assist, Min assist, From elevated surface, Via lift equipment, +2 physical assistance  General transfer comment: from elevated bed<>stedy x2 reps, pt totalA to slide ~12" toward [facility] in [place] frame on second STS as she is in too much pain to try taking  sidesteps or scooting to [facility].  Greatly increased time/encouragement to stand. Had intended to have her stand to RW initially but pt with severe fear of falls and refuses to try standing with RW. Transfer via Lift Equipment: Stedy  Ambulation/Gait               General Gait Details: pt to anxious to attempt, cannot weight shift standing in [place] due to pain and fear of falls.   Stairs             Wheelchair Mobility     Tilt Bed    Modified Rankin (Stroke Patients Only)       Balance Overall balance assessment: History of Falls, Mild deficits observed, not formally tested Sitting-balance support: Feet supported, Bilateral upper extremity supported Sitting balance-Leahy Scale: Poor (posterior lean sitting EOB when not using BUE for support)     Standing balance support: Reliant on assistive device for balance, Bilateral upper extremity supported Standing balance-Leahy Scale: Poor Standing balance comment: fearful of falling standing in [employer] Communication Communication: No apparent difficulties Factors Affecting Communication: Hearing impaired  Cognition Arousal: Alert Behavior During Therapy: Flat affect, Anxious   PT - Cognitive impairments: No family/caregiver present to determine baseline, Memory, Attention, Initiation, Sequencing, Problem solving, Safety/Judgement                       PT - Cognition Comments: Slow to initiate, pt expresses fear of falls frequently which seems to be related to slow initiation, pt able to be convinced with encouragement, motivated by wanting to get home to her cat, "Wally". Severe fear of falls expressed by patient and difficulty maintaining attention to task due to this fear.        Cueing Cueing Techniques: Verbal cues, Gestural cues  Exercises General Exercises - Lower Extremity Ankle Circles/Pumps:  (pt cued to attempt but refusing due to BLE pain)     General Comments General comments (skin integrity, edema, etc.): area of open skin on R mid-back, RN called to room and provided foam dressing to area and barrier ointment between transfer attempts; pt on 2L O2 [place] and no significant DOE other than apparently related to fear of falls.      Pertinent Vitals/Pain Pain Assessment Faces Pain Scale: Hurts even more Pain Location: B LE with movement Pain Descriptors / Indicators: Grimacing, Discomfort    Home Living                          Prior Function            PT Goals (current goals can now be found in the care plan section) Acute Rehab PT Goals Patient Stated Goal: to go home so I can take care of my cat, Wally PT Goal Formulation: With patient Time For Goal Achievement: 05/06/23 Progress towards PT goals: Progressing toward goals    Frequency    Min 1X/week      PT Plan      Co-evaluation PT/OT/SLP Co-Evaluation/Treatment: Yes Reason for Co-Treatment: For patient/therapist safety;To address functional/ADL transfers PT goals addressed during session: Mobility/safety with  mobility;Balance;Proper use of DME        AM-PAC PT "6 Clicks" Mobility   Outcome Measure  Help needed turning from your back to your side while in a flat bed without using bedrails?: A Lot Help needed moving from lying on your back to sitting on the side of a flat bed without using bedrails?: A Lot Help needed moving to and from a bed to a chair (including a wheelchair)?: Total Help needed standing up from a chair using your arms (e.g., wheelchair or bedside chair)?: Total (+2) Help needed to walk in hospital room?: Total Help needed climbing 3-5 steps with a railing? : Total 6 Click Score: 8    End of Session Equipment Utilized During Treatment: Gait belt;Oxygen Activity Tolerance: Patient limited by pain;Other (comment) (limited engagement) Patient left: in bed;with call bell/phone within reach;with bed alarm set;Other (comment)  (heels floated, HOB 48* bed in chair posture so she can eat her dinner) Nurse Communication: Mobility status;Need for lift equipment;Precautions;Other (comment) (skin breakdown; pt needs air bed; +2 Stedy for OOB) PT Visit Diagnosis: Unsteadiness on feet (R26.81);Muscle weakness (generalized) (M62.81) Pain - part of body: Leg (and back)     Time: 1610-9604 PT Time Calculation (min) (ACUTE ONLY): 43 min  Charges:    $Therapeutic Activity: 23-37 mins PT General Charges $$ ACUTE PT VISIT: 1 Visit                     Melesio Madara P., PTA Acute Rehabilitation Services Secure Chat Preferred 9a-5:30pm Office: [phone]    Dorathy Kinsman [facility] 04/28/2023, 2:16 PM

## 2023-04-28 NOTE — Progress Notes (Signed)
Occupational Therapy Treatment Patient Details Name: Denise Kelly MRN: [id number] DOB: June 07, 1940 Today's Date: 04/28/2023   History of present illness 83 y.o. female presents to [facility] 04/21/23 with worsening LE rash for 4 weeks. Admitted with recurrent B LE cellulitis and chronic lymphedema. Recent admit 1/17 and 1/22 for UTI and BLE cellulitis. PMHx: alcohol abuse, anxiety/depression, GERD, HTN, HOH, obesity, chronic diastolic CHF, PE.   OT comments  Pt in bed upon therapy arrival. With increased encouragement, pt did participate in OT/PT co-tx. Pt continues to be limited by pain and anxiety related to fear of falling. Did better when able to attempt tasks herself although noticed to shut down if provided too much information or asked too many questions. Antony Salmon was used to complete 2 trials of sit to stands at EOB. Pt continues to be appropriate for continued inpatient follow up therapy, <3 hours/day. OT will continue to follow pt acutely.        If plan is discharge home, recommend the following:  Two people to help with walking and/or transfers;Two people to help with bathing/dressing/bathroom   Equipment Recommendations  Other (comment) (defer to next venue of care)       Precautions / Restrictions Precautions Precautions: Fall Recall of Precautions/Restrictions: Impaired Precaution/Restrictions Comments: BLE cellulitis, skin breakdown on back/groin also due to immobility Restrictions Weight Bearing Restrictions Per Provider Order: No       Mobility Bed Mobility Overal bed mobility: Needs Assistance Bed Mobility: Rolling, Sit to Sidelying, Supine to Sit     Supine to sit: Min assist, HOB elevated, Used rails, +2 for physical assistance   Sit to sidelying: Max assist, +2 for safety/equipment, Used rails General bed mobility comments: Dense cues for safety/sequencing and initiation, pt able to initiate moving limbs toward L EOB with increased time and encouragement, needs trunk  support more due to fear of falls when pushing away from mattress to sit up, heavy reliance on bed rail. Increased BLE assist returning to supine due to fatigue and BLE feeling too heavy/too much in pain.    Transfers Overall transfer level: Needs assistance Equipment used: Ambulation equipment used Transfers: Sit to/from Stand, Bed to chair/wheelchair/BSC Sit to Stand: Mod assist, Min assist, From elevated surface, Via lift equipment, +2 physical assistance           General transfer comment: from elevated bed<>stedy x2 reps, pt totalA to slide ~12" toward [facility] in [place] frame on second STS as she is in too much pain to try taking sidesteps or scooting to [facility].  Greatly increased time/encouragement to stand. Had intended to have her stand to RW initially but pt with severe fear of falls and refuses to try standing with RW. Transfer via Lift Equipment: Stedy   Balance Overall balance assessment: History of Falls, Mild deficits observed, not formally tested Sitting-balance support: Feet supported, Bilateral upper extremity supported Sitting balance-Leahy Scale: Poor (posterior lean sitting EOB when not using BUE for support)     Standing balance support: Reliant on assistive device for balance, Bilateral upper extremity supported Standing balance-Leahy Scale: Poor Standing balance comment: fearful of falling standing in [place]          ADL either performed or assessed with clinical judgement              Communication Communication Communication: No apparent difficulties Factors Affecting Communication: Hearing impaired   Cognition Arousal: Alert Behavior During Therapy: Flat affect, Anxious Cognition: Cognition impaired   Orientation impairments: Situation Awareness: Intellectual awareness impaired Memory  impairment (select all impairments): Short-term memory, Declarative long-term memory Attention impairment (select first level of impairment): Focused attention Executive  functioning impairment (select all impairments): Initiation, Organization, Sequencing, Reasoning, Problem solving           Cueing   Cueing Techniques: Verbal cues, Gestural cues  Exercises General Exercises - Lower Extremity Ankle Circles/Pumps:  (pt cued to attempt but refusing due to BLE pain)       General Comments area of open skin on R mid-back, RN called to room and provided foam dressing to area and barrier ointment between transfer attempts; pt on 2L O2 [place] and no significant DOE other than apparently related to fear of falls.    Pertinent Vitals/ Pain       Pain Assessment Pain Assessment: Faces Faces Pain Scale: Hurts even more Pain Location: B LE with movement Pain Descriptors / Indicators: Grimacing, Discomfort Pain Intervention(s): Monitored during session, Limited activity within patient's tolerance, Repositioned         Frequency  Min 1X/week        Progress Toward Goals  OT Goals(current goals can now be found in the care plan section)  Progress towards OT goals: Progressing toward goals  Acute Rehab OT Goals Patient Stated Goal: to go home ADL Goals Pt Will Perform Grooming: with set-up;sitting Pt Will Perform Upper Body Bathing: with set-up;sitting Pt Will Perform Lower Body Dressing: with supervision;with adaptive equipment;sit to/from stand Pt Will Transfer to Toilet: with max assist;stand pivot transfer;bedside commode Pt Will Perform Toileting - Clothing Manipulation and hygiene: sit to/from stand;with min assist Additional ADL Goal #1: Pt will transition from supine to sitting on EOB with Max A in order to participate in self care tasks and progress OOB activity.  Plan      Co-evaluation    PT/OT/SLP Co-Evaluation/Treatment: Yes Reason for Co-Treatment: For patient/therapist safety;To address functional/ADL transfers PT goals addressed during session: Mobility/safety with mobility;Balance;Proper use of DME OT goals addressed during session:  Proper use of Adaptive equipment and DME;Strengthening/ROM      AM-PAC OT "6 Clicks" Daily Activity     Outcome Measure   Help from another person eating meals?: A Little Help from another person taking care of personal grooming?: A Lot Help from another person toileting, which includes using toliet, bedpan, or urinal?: Total Help from another person bathing (including washing, rinsing, drying)?: Total Help from another person to put on and taking off regular upper body clothing?: Total Help from another person to put on and taking off regular lower body clothing?: Total 6 Click Score: 9    End of Session Equipment Utilized During Treatment: Other (comment) Antony Salmon)  OT Visit Diagnosis: Unsteadiness on feet (R26.81);Pain;History of falling (Z91.81);Other abnormalities of gait and mobility (R26.89) Pain - Right/Left:  (bilateral) Pain - part of body: Leg;Ankle and joints of foot   Activity Tolerance Other (comment);Patient limited by pain (treatment limited due to fear of falling,)   Patient Left in bed;with bed alarm set;with call bell/phone within reach   Nurse Communication Mobility status        Time: 1250-1330 OT Time Calculation (min): 40 min  Charges: OT General Charges $OT Visit: 1 Visit OT Treatments $Therapeutic Activity: 8-22 mins  Limmie Patricia, OTR/L,CBIS  Supplemental OT - [facility] and [facility] Secure Chat Preferred    Graceanne Guin, Charisse March 04/28/2023, 2:51 PM

## 2023-04-28 NOTE — TOC Transition Note (Signed)
Transition of Care [facility]) - Discharge Note   Patient Details  Name: Denise Kelly MRN: [id number] Date of Birth: 1940/04/03  Transition of Care [facility] CM/SW Contact:  Carley Hammed, LCSW Phone Number: 04/28/2023, 3:02 PM   Clinical Narrative:     Pt to be transported to [facility] via [facility]. Nurse to call report to [phone]. Rm# [phone]  Final next level of care: Skilled Nursing Facility Barriers to Discharge: Barriers Resolved   Patient Goals and CMS Choice Patient states their goals for this hospitalization and ongoing recovery are:: rehab          Discharge Placement              Patient chooses bed at: [facility] Patient to be transferred to facility by: [facility] Name of family member notified: [name] [phone] Patient and family notified of of transfer: 04/28/23  Discharge Plan and Services Additional resources added to the After Visit Summary for   In-house Referral: Clinical Social Work Discharge Planning Services: NA            DME Arranged: N/A DME Agency: NA       HH Arranged: NA HH Agency: NA        Social Drivers of Health (SDOH) Interventions SDOH Screenings   Food Insecurity: No Food Insecurity (04/21/2023)  Housing: Low Risk  (04/21/2023)  Transportation Needs: No Transportation Needs (04/21/2023)  Utilities: Not At Risk (04/21/2023)  Depression (PHQ2-9): Low Risk  (01/09/2019)  Social Connections: Patient Declined (04/21/2023)  Tobacco Use: Medium Risk (04/21/2023)     Readmission Risk Interventions    04/08/2023    1:02 PM 04/06/2023    3:38 PM 05/02/2022   11:19 AM  Readmission Risk Prevention Plan  Transportation Screening Complete Complete Complete  PCP or Specialist Appt within 3-5 Days Complete Complete   HRI or Home Care Consult Complete Complete   Social Work Consult for Recovery Care Planning/Counseling Complete Complete   Palliative Care Screening Complete Not Applicable   Medication Review [employer]) Complete  Complete Complete  PCP or Specialist appointment within 3-5 days of discharge   Complete  HRI or Home Care Consult   Complete  SW Recovery Care/Counseling Consult   Complete  Palliative Care Screening   Not Applicable  Skilled Nursing Facility   Complete

## 2023-04-28 NOTE — Consult Note (Signed)
WOC Nurse Consult Note: Requested to assess cellulitis  on bilateral legs.   Performed remotely after consult of progress notes and photo.  Last consult with the WOC team 02/05.  Bilat legs with generalized lymphedema and erythremia and and black dry crusted skin to lower legs; appearance is consistent with cellulitis, alcohol disorder, and venous stasis changes.  Pt is already on antibiotics for the cellulitis.   Dressing procedure/placement/frequency:  Cleanse BLE wounds with Vashe (lawson#[id number]). Apply Xeroform (change daily) and then cover with ABD pads and Kerlix.  WOC team will not plan to follow further.  Please reconsult if further assistance is needed. Thank-you,  Denyse Amass BSN, RN, [employer], WOC  (Pager: [phone])

## 2023-04-28 NOTE — Plan of Care (Signed)

## 2023-04-30 DIAGNOSIS — I5032 Chronic diastolic (congestive) heart failure: Secondary | ICD-10-CM | POA: Diagnosis not present

## 2023-04-30 DIAGNOSIS — I10 Essential (primary) hypertension: Secondary | ICD-10-CM | POA: Diagnosis not present

## 2023-04-30 DIAGNOSIS — Z86711 Personal history of pulmonary embolism: Secondary | ICD-10-CM | POA: Diagnosis not present

## 2023-04-30 DIAGNOSIS — K219 Gastro-esophageal reflux disease without esophagitis: Secondary | ICD-10-CM | POA: Diagnosis not present

## 2023-05-16 DIAGNOSIS — L03115 Cellulitis of right lower limb: Secondary | ICD-10-CM | POA: Diagnosis not present

## 2023-05-16 DIAGNOSIS — R5381 Other malaise: Secondary | ICD-10-CM | POA: Diagnosis not present

## 2023-05-16 DIAGNOSIS — R262 Difficulty in walking, not elsewhere classified: Secondary | ICD-10-CM | POA: Diagnosis not present

## 2023-05-16 DIAGNOSIS — E876 Hypokalemia: Secondary | ICD-10-CM | POA: Diagnosis not present

## 2023-05-16 DIAGNOSIS — N3000 Acute cystitis without hematuria: Secondary | ICD-10-CM | POA: Diagnosis not present

## 2023-05-16 DIAGNOSIS — R2681 Unsteadiness on feet: Secondary | ICD-10-CM | POA: Diagnosis not present

## 2023-05-16 DIAGNOSIS — I10 Essential (primary) hypertension: Secondary | ICD-10-CM | POA: Diagnosis not present

## 2023-05-16 DIAGNOSIS — G9341 Metabolic encephalopathy: Secondary | ICD-10-CM | POA: Diagnosis not present

## 2023-05-16 DIAGNOSIS — R278 Other lack of coordination: Secondary | ICD-10-CM | POA: Diagnosis not present

## 2023-05-16 DIAGNOSIS — M6281 Muscle weakness (generalized): Secondary | ICD-10-CM | POA: Diagnosis not present

## 2023-05-16 DIAGNOSIS — E785 Hyperlipidemia, unspecified: Secondary | ICD-10-CM | POA: Diagnosis not present

## 2023-05-16 DIAGNOSIS — L03119 Cellulitis of unspecified part of limb: Secondary | ICD-10-CM | POA: Diagnosis not present

## 2023-05-16 DIAGNOSIS — R41841 Cognitive communication deficit: Secondary | ICD-10-CM | POA: Diagnosis not present

## 2023-05-21 DIAGNOSIS — I87313 Chronic venous hypertension (idiopathic) with ulcer of bilateral lower extremity: Secondary | ICD-10-CM | POA: Diagnosis not present

## 2023-05-26 ENCOUNTER — Encounter (HOSPITAL_COMMUNITY): Payer: Self-pay | Admitting: Emergency Medicine

## 2023-05-26 ENCOUNTER — Other Ambulatory Visit: Payer: Self-pay

## 2023-05-26 ENCOUNTER — Inpatient Hospital Stay (HOSPITAL_COMMUNITY)
Admission: EM | Admit: 2023-05-26 | Discharge: 2023-06-10 | DRG: 689 | Disposition: A | Discharge status: Skilled Nursing Facility | Payer: Medicare (Managed Care) | Source: Skilled Nursing Facility | Attending: Family Medicine | Admitting: Family Medicine

## 2023-05-26 DIAGNOSIS — Z882 Allergy status to sulfonamides status: Secondary | ICD-10-CM

## 2023-05-26 DIAGNOSIS — I872 Venous insufficiency (chronic) (peripheral): Secondary | ICD-10-CM | POA: Diagnosis present

## 2023-05-26 DIAGNOSIS — R197 Diarrhea, unspecified: Secondary | ICD-10-CM | POA: Diagnosis present

## 2023-05-26 DIAGNOSIS — F419 Anxiety disorder, unspecified: Secondary | ICD-10-CM | POA: Diagnosis present

## 2023-05-26 DIAGNOSIS — R7881 Bacteremia: Secondary | ICD-10-CM | POA: Diagnosis present

## 2023-05-26 DIAGNOSIS — I110 Hypertensive heart disease with heart failure: Secondary | ICD-10-CM | POA: Diagnosis present

## 2023-05-26 DIAGNOSIS — Z888 Allergy status to other drugs, medicaments and biological substances status: Secondary | ICD-10-CM

## 2023-05-26 DIAGNOSIS — R410 Disorientation, unspecified: Secondary | ICD-10-CM | POA: Diagnosis not present

## 2023-05-26 DIAGNOSIS — E876 Hypokalemia: Secondary | ICD-10-CM | POA: Diagnosis present

## 2023-05-26 DIAGNOSIS — I444 Left anterior fascicular block: Secondary | ICD-10-CM | POA: Diagnosis present

## 2023-05-26 DIAGNOSIS — L97912 Non-pressure chronic ulcer of unspecified part of right lower leg with fat layer exposed: Secondary | ICD-10-CM | POA: Diagnosis present

## 2023-05-26 DIAGNOSIS — N390 Urinary tract infection, site not specified: Secondary | ICD-10-CM | POA: Diagnosis present

## 2023-05-26 DIAGNOSIS — I5032 Chronic diastolic (congestive) heart failure: Secondary | ICD-10-CM | POA: Diagnosis present

## 2023-05-26 DIAGNOSIS — Z96651 Presence of right artificial knee joint: Secondary | ICD-10-CM | POA: Diagnosis present

## 2023-05-26 DIAGNOSIS — I38 Endocarditis, valve unspecified: Secondary | ICD-10-CM | POA: Diagnosis present

## 2023-05-26 DIAGNOSIS — B952 Enterococcus as the cause of diseases classified elsewhere: Secondary | ICD-10-CM | POA: Diagnosis present

## 2023-05-26 DIAGNOSIS — F410 Panic disorder [episodic paroxysmal anxiety] without agoraphobia: Secondary | ICD-10-CM | POA: Diagnosis present

## 2023-05-26 DIAGNOSIS — N3000 Acute cystitis without hematuria: Secondary | ICD-10-CM | POA: Diagnosis not present

## 2023-05-26 DIAGNOSIS — Z79899 Other long term (current) drug therapy: Secondary | ICD-10-CM

## 2023-05-26 DIAGNOSIS — Z881 Allergy status to other antibiotic agents status: Secondary | ICD-10-CM

## 2023-05-26 DIAGNOSIS — Z8249 Family history of ischemic heart disease and other diseases of the circulatory system: Secondary | ICD-10-CM

## 2023-05-26 DIAGNOSIS — Z87891 Personal history of nicotine dependence: Secondary | ICD-10-CM

## 2023-05-26 DIAGNOSIS — M1990 Unspecified osteoarthritis, unspecified site: Secondary | ICD-10-CM | POA: Diagnosis present

## 2023-05-26 DIAGNOSIS — Z7901 Long term (current) use of anticoagulants: Secondary | ICD-10-CM

## 2023-05-26 DIAGNOSIS — F32A Depression, unspecified: Secondary | ICD-10-CM | POA: Diagnosis present

## 2023-05-26 DIAGNOSIS — K219 Gastro-esophageal reflux disease without esophagitis: Secondary | ICD-10-CM | POA: Diagnosis present

## 2023-05-26 DIAGNOSIS — B957 Other staphylococcus as the cause of diseases classified elsewhere: Secondary | ICD-10-CM | POA: Diagnosis present

## 2023-05-26 DIAGNOSIS — Z886 Allergy status to analgesic agent status: Secondary | ICD-10-CM

## 2023-05-26 DIAGNOSIS — I878 Other specified disorders of veins: Secondary | ICD-10-CM | POA: Diagnosis present

## 2023-05-26 DIAGNOSIS — G9341 Metabolic encephalopathy: Secondary | ICD-10-CM | POA: Diagnosis present

## 2023-05-26 DIAGNOSIS — Z86711 Personal history of pulmonary embolism: Secondary | ICD-10-CM

## 2023-05-26 DIAGNOSIS — I340 Nonrheumatic mitral (valve) insufficiency: Secondary | ICD-10-CM | POA: Diagnosis present

## 2023-05-26 LAB — CBC WITH DIFFERENTIAL/PLATELET
Abs Immature Granulocytes: 0.02 10*3/uL (ref 0.00–0.07)
Basophils Absolute: 0.1 10*3/uL (ref 0.0–0.1)
Basophils Relative: 1 %
Eosinophils Absolute: 0.1 10*3/uL (ref 0.0–0.5)
Eosinophils Relative: 1 %
HCT: 46.3 % — ABNORMAL HIGH (ref 36.0–46.0)
Hemoglobin: 15.7 g/dL — ABNORMAL HIGH (ref 12.0–15.0)
Immature Granulocytes: 0 %
Lymphocytes Relative: 22 %
Lymphs Abs: 1.6 10*3/uL (ref 0.7–4.0)
MCH: 28.5 pg (ref 26.0–34.0)
MCHC: 33.9 g/dL (ref 30.0–36.0)
MCV: 84.2 fL (ref 80.0–100.0)
Monocytes Absolute: 0.6 10*3/uL (ref 0.1–1.0)
Monocytes Relative: 8 %
Neutro Abs: 4.9 10*3/uL (ref 1.7–7.7)
Neutrophils Relative %: 68 %
Platelets: 221 10*3/uL (ref 150–400)
RBC: 5.5 MIL/uL — ABNORMAL HIGH (ref 3.87–5.11)
RDW: 14.1 % (ref 11.5–15.5)
WBC: 7.2 10*3/uL (ref 4.0–10.5)
nRBC: 0 % (ref 0.0–0.2)

## 2023-05-26 LAB — URINALYSIS, W/ REFLEX TO CULTURE (INFECTION SUSPECTED)
Bilirubin Urine: NEGATIVE
Glucose, UA: NEGATIVE mg/dL
Ketones, ur: 20 mg/dL — AB
Nitrite: NEGATIVE
Protein, ur: 30 mg/dL — AB
Specific Gravity, Urine: 1.015 (ref 1.005–1.030)
WBC, UA: >50 WBC/hpf (ref 0–5)
pH: 6 (ref 5.0–8.0)

## 2023-05-26 LAB — COMPREHENSIVE METABOLIC PANEL
ALT: 24 U/L (ref 0–44)
AST: 36 U/L (ref 15–41)
Albumin: 3.5 g/dL (ref 3.5–5.0)
Alkaline Phosphatase: 81 U/L (ref 38–126)
Anion gap: 15 (ref 5–15)
BUN: 11 mg/dL (ref 8–23)
CO2: 23 mmol/L (ref 22–32)
Calcium: 8.9 mg/dL (ref 8.9–10.3)
Chloride: 102 mmol/L (ref 98–111)
Creatinine, Ser: 0.78 mg/dL (ref 0.44–1.00)
GFR, Estimated: >60 mL/min (ref >60)
Glucose, Bld: 134 mg/dL — ABNORMAL HIGH (ref 70–99)
Potassium: 3.5 mmol/L (ref 3.5–5.1)
Sodium: 140 mmol/L (ref 135–145)
Total Bilirubin: 1.6 mg/dL — ABNORMAL HIGH (ref 0.0–1.2)
Total Protein: 7.1 g/dL (ref 6.5–8.1)

## 2023-05-26 LAB — I-STAT CHEM 8, ED
BUN: 14 mg/dL (ref 8–23)
Calcium, Ion: 1.05 mmol/L — ABNORMAL LOW (ref 1.15–1.40)
Chloride: 104 mmol/L (ref 98–111)
Creatinine, Ser: 0.7 mg/dL (ref 0.44–1.00)
Glucose, Bld: 130 mg/dL — ABNORMAL HIGH (ref 70–99)
HCT: 45 % (ref 36.0–46.0)
Hemoglobin: 15.3 g/dL — ABNORMAL HIGH (ref 12.0–15.0)
Potassium: 3.6 mmol/L (ref 3.5–5.1)
Sodium: 141 mmol/L (ref 135–145)
TCO2: 26 mmol/L (ref 22–32)

## 2023-05-26 LAB — RAPID URINE DRUG SCREEN, HOSP PERFORMED
Amphetamines: NOT DETECTED
Barbiturates: NOT DETECTED
Benzodiazepines: NOT DETECTED
Cocaine: NOT DETECTED
Opiates: NOT DETECTED
Tetrahydrocannabinol: NOT DETECTED

## 2023-05-26 LAB — ETHANOL: Alcohol, Ethyl (B): <10 mg/dL (ref <10)

## 2023-05-26 LAB — MAGNESIUM: Magnesium: 2 mg/dL (ref 1.7–2.4)

## 2023-05-26 MED ORDER — SODIUM CHLORIDE 0.9 % IV BOLUS
500.0000 mL | Freq: Once | INTRAVENOUS | Status: AC
  Administered 2023-05-26: 500 mL via INTRAVENOUS

## 2023-05-26 MED ORDER — SODIUM CHLORIDE 0.9 % IV SOLN
1.0000 g | Freq: Once | INTRAVENOUS | Status: AC
  Administered 2023-05-26: 1 g via INTRAVENOUS
  Filled 2023-05-26: qty 10

## 2023-05-26 NOTE — ED Provider Notes (Incomplete)
  EMERGENCY DEPARTMENT AT [facility] Provider Note   CSN: [id number] Arrival date & time: 05/26/23  1924     History {Add pertinent medical, surgical, social history, OB history to HPI:1} Chief Complaint  Patient presents with   Urinary Tract Infection    Denise Kelly is a 83 y.o. female with chronic diastolic CHF, alcohol use disorder, anxiety, depression, GERD, hypertension, pulmonary embolism on apixaban, hard of hearing, PMH as listed below who presents via ambulance. Pt from [facility]. EMS does not know orientation baseline   Pt asked to have bandage rewrapped on leg. Pt was at facility sitting in feces and urine for unknown amount of time. Pt states she was not being fed properly.  Facility was unable to give any information to EMS about pt. Pt states she is suppose to be on oxygen and then stated she is not***   110 154/86 97.8 26 95% room air  Cbg 144 18G L AC.   Per chart review patient was recently admitted from 04/20/2018 5-11 25 for cellulitis of bilateral lower extremities, complicating underlying chronic lymphedema versus venous stasis, completed 7 days of IV ceftriaxone. Possible dementia noted. DC summary notes that "judgment and insight appear impaired."  Past Medical History:  Diagnosis Date   Alcohol abuse    ETOH and xanax   Anxiety    Panic attacks    Arthritis    Cancer (HCC)    skin- basal , squamous , melonoma- right hand   Cellulitis    right leg   Complication of anesthesia 2009   "felt drunk for a week after"- AVM- both times- felt drunk   Depression    Encephalopathy    GERD (gastroesophageal reflux disease)    HOH (hard of hearing)    HOH (hard of hearing)    Hypertension    not on mediacations   Palpitations    PONV (postoperative nausea and vomiting)    Spondylolisthesis of lumbosacral region    UTI (urinary tract infection)    frequently       Home Medications Prior to Admission medications   Medication  Sig Start Date End Date Taking? Authorizing Provider  acetaminophen (TYLENOL) 650 MG CR tablet Take 2 tablets (1,300 mg total) by mouth 3 (three) times daily. 1300 mg 3 times daily x 3 to 5 days then recommend changing to 1300 mg 3 times daily as needed for mild pain. 04/28/23   Hongalgi, Maximino Greenland, MD  apixaban (ELIQUIS) 5 MG TABS tablet Take 1 tablet (5 mg total) by mouth 2 (two) times daily. 04/08/23 05/09/23  Lanae Boast, MD  furosemide (LASIX) 20 MG tablet Take 1 tablet (20 mg total) by mouth daily. 04/29/23   Hongalgi, Maximino Greenland, MD  gabapentin (NEURONTIN) 100 MG capsule Take 2 capsules (200 mg total) by mouth 3 (three) times daily. 04/28/23   Hongalgi, Maximino Greenland, MD  methocarbamol (ROBAXIN) 500 MG tablet Take 1 tablet (500 mg total) by mouth every 8 (eight) hours as needed for muscle spasms. 04/28/23   Hongalgi, Maximino Greenland, MD  ondansetron (ZOFRAN) 4 MG tablet Take 1 tablet (4 mg total) by mouth every 8 (eight) hours as needed for nausea or vomiting. 04/08/23   Lanae Boast, MD  phenazopyridine (PYRIDIUM) 100 MG tablet Take 1 tablet (100 mg total) by mouth 3 (three) times daily as needed (dysurea). 04/08/23   Lanae Boast, MD  potassium chloride SA (KLOR-CON M) 10 MEQ tablet Take 1 tablet (10 mEq total) by mouth  daily. 04/29/23   Elease Etienne, MD  pramipexole (MIRAPEX) 1 MG tablet Take 1 tablet (1 mg total) by mouth every evening. 04/08/23 05/08/23  Lanae Boast, MD  traMADol (ULTRAM) 50 MG tablet Take 1 tablet (50 mg total) by mouth every 6 (six) hours as needed for moderate pain (pain score 4-6) or severe pain (pain score 7-10). After 3 to 5 days, recommend tapering to discontinue as soon as able. 04/28/23   Hongalgi, Maximino Greenland, MD  traZODone (DESYREL) 100 MG tablet Take 1 tablet (100 mg total) by mouth at bedtime as needed for sleep. 04/08/23 05/09/23  Lanae Boast, MD      Allergies    Sulfa antibiotics, Coreg [carvedilol], Motrin [ibuprofen], Toprol xl [metoprolol tartrate], Doxycycline hyclate, Flovent hfa  [fluticasone], and Statins    Review of Systems   Review of Systems A 10 point review of systems was performed and is negative unless otherwise reported in HPI.  Physical Exam Updated Vital Signs BP (!) 162/87 (BP Location: Right Arm)   Pulse (!) 105   Temp (!) 97.5 F (36.4 C) (Oral)   Resp (!) 23   Ht 5\' 7"  (1.702 m)   SpO2 95%   BMI 34.18 kg/m  Physical Exam General: Normal appearing {Desc; female/female:11659}, lying in bed.  HEENT: PERRLA, Sclera anicteric, MMM, trachea midline.  Cardiology: RRR, no murmurs/rubs/gallops. BL radial and DP pulses equal bilaterally.  Resp: Normal respiratory rate and effort. CTAB, no wheezes, rhonchi, crackles.  Abd: Soft, non-tender, non-distended. No rebound tenderness or guarding.  GU: Deferred. MSK: No peripheral edema or signs of trauma. Extremities without deformity or TTP. No cyanosis or clubbing. Skin: warm, dry. No rashes or lesions. Back: No CVA tenderness Neuro: A&Ox4, CNs II-XII grossly intact. MAEs. Sensation grossly intact.  Psych: Normal mood and affect.   ED Results / Procedures / Treatments   Labs (all labs ordered are listed, but only abnormal results are displayed) Labs Reviewed - No data to display  EKG None  Radiology No results found.  Procedures Procedures  {Document cardiac monitor, telemetry assessment procedure when appropriate:1}  Medications Ordered in ED Medications - No data to display  ED Course/ Medical Decision Making/ A&P                          Medical Decision Making   This patient presents to the ED for concern of ***, this involves an extensive number of treatment options, and is a complaint that carries with it a high risk of complications and morbidity.  I considered the following differential and admission for this acute, potentially life threatening condition.   MDM:    ***     Labs: I Ordered, and personally interpreted labs.  The pertinent results include:  ***  Imaging  Studies ordered: I ordered imaging studies including *** I independently visualized and interpreted imaging. I agree with the radiologist interpretation  Additional history obtained from ***.  External records from outside source obtained and reviewed including ***  Cardiac Monitoring: The patient was maintained on a cardiac monitor.  I personally viewed and interpreted the cardiac monitored which showed an underlying rhythm of: ***  Reevaluation: After the interventions noted above, I reevaluated the patient and found that they have :{resolved/improved/worsened:23923::"improved"}  Social Determinants of Health: ***  Disposition:  ***  Co morbidities that complicate the patient evaluation  Past Medical History:  Diagnosis Date   Alcohol abuse    ETOH and xanax  Anxiety    Panic attacks    Arthritis    Cancer (HCC)    skin- basal , squamous , melonoma- right hand   Cellulitis    right leg   Complication of anesthesia 2009   "felt drunk for a week after"- AVM- both times- felt drunk   Depression    Encephalopathy    GERD (gastroesophageal reflux disease)    HOH (hard of hearing)    HOH (hard of hearing)    Hypertension    not on mediacations   Palpitations    PONV (postoperative nausea and vomiting)    Spondylolisthesis of lumbosacral region    UTI (urinary tract infection)    frequently     Medicines No orders of the defined types were placed in this encounter.   I have reviewed the patients home medicines and have made adjustments as needed  Problem List / ED Course: Problem List Items Addressed This Visit   None        {Document critical care time when appropriate:1} {Document review of labs and clinical decision tools ie heart score, Chads2Vasc2 etc:1}  {Document your independent review of radiology images, and any outside records:1} {Document your discussion with family members, caretakers, and with consultants:1} {Document social determinants of  health affecting pt's care:1} {Document your decision making why or why not admission, treatments were needed:1}  This note was created using dictation software, which may contain spelling or grammatical errors.

## 2023-05-26 NOTE — ED Triage Notes (Signed)
 Pt arrives via ambulance. Pt from assisted living facility. EMS does not know orientation baseline  Pt asked to have bandage rewrapped on leg. Pt was at facility sitting in feces and urine for unknown amount of time. Pt states she was not being fed properly per EMS.  Facility was unable to give any information to EMS about pt. Pt states she is suppose to be on oxygen and then stated she is not  110 154/86 97.8 26 95% room air  Cbg 144 18G L AC

## 2023-05-27 DIAGNOSIS — F410 Panic disorder [episodic paroxysmal anxiety] without agoraphobia: Secondary | ICD-10-CM | POA: Diagnosis present

## 2023-05-27 DIAGNOSIS — R7881 Bacteremia: Secondary | ICD-10-CM | POA: Diagnosis not present

## 2023-05-27 DIAGNOSIS — Z7401 Bed confinement status: Secondary | ICD-10-CM | POA: Diagnosis not present

## 2023-05-27 DIAGNOSIS — N390 Urinary tract infection, site not specified: Secondary | ICD-10-CM | POA: Diagnosis present

## 2023-05-27 DIAGNOSIS — N10 Acute tubulo-interstitial nephritis: Secondary | ICD-10-CM | POA: Diagnosis not present

## 2023-05-27 DIAGNOSIS — F32A Depression, unspecified: Secondary | ICD-10-CM | POA: Diagnosis present

## 2023-05-27 DIAGNOSIS — Z87891 Personal history of nicotine dependence: Secondary | ICD-10-CM | POA: Diagnosis not present

## 2023-05-27 DIAGNOSIS — B952 Enterococcus as the cause of diseases classified elsewhere: Secondary | ICD-10-CM | POA: Diagnosis not present

## 2023-05-27 DIAGNOSIS — Z86718 Personal history of other venous thrombosis and embolism: Secondary | ICD-10-CM | POA: Diagnosis not present

## 2023-05-27 DIAGNOSIS — N3000 Acute cystitis without hematuria: Secondary | ICD-10-CM | POA: Diagnosis not present

## 2023-05-27 DIAGNOSIS — L97912 Non-pressure chronic ulcer of unspecified part of right lower leg with fat layer exposed: Secondary | ICD-10-CM | POA: Diagnosis present

## 2023-05-27 DIAGNOSIS — Z86711 Personal history of pulmonary embolism: Secondary | ICD-10-CM | POA: Diagnosis not present

## 2023-05-27 DIAGNOSIS — R4182 Altered mental status, unspecified: Secondary | ICD-10-CM | POA: Diagnosis not present

## 2023-05-27 DIAGNOSIS — I5032 Chronic diastolic (congestive) heart failure: Secondary | ICD-10-CM | POA: Diagnosis not present

## 2023-05-27 DIAGNOSIS — I340 Nonrheumatic mitral (valve) insufficiency: Secondary | ICD-10-CM | POA: Diagnosis not present

## 2023-05-27 DIAGNOSIS — E876 Hypokalemia: Secondary | ICD-10-CM | POA: Diagnosis present

## 2023-05-27 DIAGNOSIS — I878 Other specified disorders of veins: Secondary | ICD-10-CM | POA: Diagnosis present

## 2023-05-27 DIAGNOSIS — A4102 Sepsis due to Methicillin resistant Staphylococcus aureus: Secondary | ICD-10-CM | POA: Diagnosis not present

## 2023-05-27 DIAGNOSIS — I38 Endocarditis, valve unspecified: Secondary | ICD-10-CM | POA: Diagnosis not present

## 2023-05-27 DIAGNOSIS — K219 Gastro-esophageal reflux disease without esophagitis: Secondary | ICD-10-CM | POA: Diagnosis not present

## 2023-05-27 DIAGNOSIS — R531 Weakness: Secondary | ICD-10-CM | POA: Diagnosis not present

## 2023-05-27 DIAGNOSIS — M6281 Muscle weakness (generalized): Secondary | ICD-10-CM | POA: Diagnosis not present

## 2023-05-27 DIAGNOSIS — Z8249 Family history of ischemic heart disease and other diseases of the circulatory system: Secondary | ICD-10-CM | POA: Diagnosis not present

## 2023-05-27 DIAGNOSIS — R197 Diarrhea, unspecified: Secondary | ICD-10-CM | POA: Diagnosis not present

## 2023-05-27 DIAGNOSIS — S81801A Unspecified open wound, right lower leg, initial encounter: Secondary | ICD-10-CM | POA: Diagnosis not present

## 2023-05-27 DIAGNOSIS — I110 Hypertensive heart disease with heart failure: Secondary | ICD-10-CM | POA: Diagnosis present

## 2023-05-27 DIAGNOSIS — Z882 Allergy status to sulfonamides status: Secondary | ICD-10-CM | POA: Diagnosis not present

## 2023-05-27 DIAGNOSIS — B957 Other staphylococcus as the cause of diseases classified elsewhere: Secondary | ICD-10-CM | POA: Diagnosis present

## 2023-05-27 DIAGNOSIS — G9341 Metabolic encephalopathy: Secondary | ICD-10-CM | POA: Diagnosis present

## 2023-05-27 DIAGNOSIS — Z96651 Presence of right artificial knee joint: Secondary | ICD-10-CM | POA: Diagnosis present

## 2023-05-27 DIAGNOSIS — R627 Adult failure to thrive: Secondary | ICD-10-CM | POA: Diagnosis not present

## 2023-05-27 DIAGNOSIS — F419 Anxiety disorder, unspecified: Secondary | ICD-10-CM | POA: Diagnosis present

## 2023-05-27 DIAGNOSIS — I872 Venous insufficiency (chronic) (peripheral): Secondary | ICD-10-CM | POA: Diagnosis present

## 2023-05-27 DIAGNOSIS — R410 Disorientation, unspecified: Secondary | ICD-10-CM | POA: Diagnosis present

## 2023-05-27 LAB — BLOOD CULTURE ID PANEL (REFLEXED) - BCID2
A.calcoaceticus-baumannii: NOT DETECTED
Bacteroides fragilis: NOT DETECTED
Candida albicans: NOT DETECTED
Candida auris: NOT DETECTED
Candida glabrata: NOT DETECTED
Candida krusei: NOT DETECTED
Candida parapsilosis: NOT DETECTED
Candida tropicalis: NOT DETECTED
Cryptococcus neoformans/gattii: NOT DETECTED
Enterobacter cloacae complex: NOT DETECTED
Enterobacterales: NOT DETECTED
Enterococcus Faecium: NOT DETECTED
Enterococcus faecalis: DETECTED — AB
Escherichia coli: NOT DETECTED
Haemophilus influenzae: NOT DETECTED
Klebsiella aerogenes: NOT DETECTED
Klebsiella oxytoca: NOT DETECTED
Klebsiella pneumoniae: NOT DETECTED
Listeria monocytogenes: NOT DETECTED
Methicillin resistance mecA/C: DETECTED — AB
Neisseria meningitidis: NOT DETECTED
Proteus species: NOT DETECTED
Pseudomonas aeruginosa: NOT DETECTED
Salmonella species: NOT DETECTED
Serratia marcescens: NOT DETECTED
Staphylococcus aureus (BCID): NOT DETECTED
Staphylococcus epidermidis: DETECTED — AB
Staphylococcus lugdunensis: DETECTED — AB
Staphylococcus species: DETECTED — AB
Stenotrophomonas maltophilia: NOT DETECTED
Streptococcus agalactiae: NOT DETECTED
Streptococcus pneumoniae: NOT DETECTED
Streptococcus pyogenes: NOT DETECTED
Streptococcus species: NOT DETECTED
Vancomycin resistance: NOT DETECTED

## 2023-05-27 LAB — C DIFFICILE QUICK SCREEN W PCR REFLEX
C Diff antigen: NEGATIVE
C Diff interpretation: NOT DETECTED
C Diff toxin: NEGATIVE

## 2023-05-27 LAB — CBC
HCT: 44.1 % (ref 36.0–46.0)
Hemoglobin: 14.9 g/dL (ref 12.0–15.0)
MCH: 28.5 pg (ref 26.0–34.0)
MCHC: 33.8 g/dL (ref 30.0–36.0)
MCV: 84.5 fL (ref 80.0–100.0)
Platelets: 193 10*3/uL (ref 150–400)
RBC: 5.22 MIL/uL — ABNORMAL HIGH (ref 3.87–5.11)
RDW: 14.2 % (ref 11.5–15.5)
WBC: 6.6 10*3/uL (ref 4.0–10.5)
nRBC: 0 % (ref 0.0–0.2)

## 2023-05-27 LAB — BASIC METABOLIC PANEL
Anion gap: 7 (ref 5–15)
BUN: 10 mg/dL (ref 8–23)
CO2: 23 mmol/L (ref 22–32)
Calcium: 8.5 mg/dL — ABNORMAL LOW (ref 8.9–10.3)
Chloride: 109 mmol/L (ref 98–111)
Creatinine, Ser: 0.74 mg/dL (ref 0.44–1.00)
GFR, Estimated: >60 mL/min (ref >60)
Glucose, Bld: 97 mg/dL (ref 70–99)
Potassium: 3 mmol/L — ABNORMAL LOW (ref 3.5–5.1)
Sodium: 139 mmol/L (ref 135–145)

## 2023-05-27 MED ORDER — APIXABAN 5 MG PO TABS
5.0000 mg | ORAL_TABLET | Freq: Two times a day (BID) | ORAL | Status: DC
  Administered 2023-05-27 – 2023-06-10 (×28): 5 mg via ORAL
  Filled 2023-05-27 (×30): qty 1

## 2023-05-27 MED ORDER — VANCOMYCIN HCL 1250 MG/250ML IV SOLN
1250.0000 mg | INTRAVENOUS | Status: DC
  Filled 2023-05-27: qty 250

## 2023-05-27 MED ORDER — ONDANSETRON HCL 4 MG PO TABS
4.0000 mg | ORAL_TABLET | Freq: Four times a day (QID) | ORAL | Status: DC | PRN
  Administered 2023-05-27 – 2023-06-01 (×4): 4 mg via ORAL
  Filled 2023-05-27 (×3): qty 1

## 2023-05-27 MED ORDER — OXYCODONE HCL 5 MG PO TABS
5.0000 mg | ORAL_TABLET | ORAL | Status: DC | PRN
  Administered 2023-05-28 – 2023-06-10 (×33): 5 mg via ORAL
  Filled 2023-05-27 (×33): qty 1

## 2023-05-27 MED ORDER — ENOXAPARIN SODIUM 40 MG/0.4ML IJ SOSY: 40.0000 mg | PREFILLED_SYRINGE | INTRAMUSCULAR | Status: DC

## 2023-05-27 MED ORDER — GABAPENTIN 100 MG PO CAPS
200.0000 mg | ORAL_CAPSULE | Freq: Three times a day (TID) | ORAL | Status: DC
  Administered 2023-05-27 – 2023-06-10 (×42): 200 mg via ORAL
  Filled 2023-05-27 (×45): qty 2

## 2023-05-27 MED ORDER — IPRATROPIUM-ALBUTEROL 0.5-2.5 (3) MG/3ML IN SOLN
3.0000 mL | RESPIRATORY_TRACT | Status: DC | PRN
  Administered 2023-06-07: 3 mL via RESPIRATORY_TRACT
  Filled 2023-05-27: qty 3

## 2023-05-27 MED ORDER — APIXABAN 5 MG PO TABS: 5.0000 mg | ORAL_TABLET | Freq: Two times a day (BID) | ORAL | Status: DC

## 2023-05-27 MED ORDER — METOPROLOL TARTRATE 5 MG/5ML IV SOLN: 5.0000 mg | INTRAVENOUS | Status: DC | PRN

## 2023-05-27 MED ORDER — POTASSIUM CHLORIDE 10 MEQ/100ML IV SOLN
10.0000 meq | INTRAVENOUS | Status: AC
Start: 2023-05-27 — End: 2023-05-27
  Administered 2023-05-27 (×3): 10 meq via INTRAVENOUS
  Filled 2023-05-27 (×4): qty 100

## 2023-05-27 MED ORDER — FUROSEMIDE 20 MG PO TABS: 20.0000 mg | ORAL_TABLET | Freq: Every day | ORAL | Status: DC

## 2023-05-27 MED ORDER — CEFTRIAXONE SODIUM 1 G IJ SOLR
1.0000 g | INTRAMUSCULAR | Status: DC
  Administered 2023-05-27: 1 g via INTRAVENOUS
  Filled 2023-05-27 (×2): qty 10

## 2023-05-27 MED ORDER — ROPINIROLE HCL 1 MG PO TABS
0.5000 mg | ORAL_TABLET | Freq: Three times a day (TID) | ORAL | Status: DC | PRN
  Administered 2023-05-31: 0.5 mg via ORAL
  Filled 2023-05-27: qty 1

## 2023-05-27 MED ORDER — VANCOMYCIN HCL 2000 MG/400ML IV SOLN
2000.0000 mg | Freq: Once | INTRAVENOUS | Status: AC
  Administered 2023-05-27: 2000 mg via INTRAVENOUS
  Filled 2023-05-27: qty 400

## 2023-05-27 MED ORDER — SODIUM CHLORIDE 0.9 % IV BOLUS
500.0000 mL | Freq: Once | INTRAVENOUS | Status: AC
  Administered 2023-05-27: 500 mL via INTRAVENOUS

## 2023-05-27 MED ORDER — HYDRALAZINE HCL 20 MG/ML IJ SOLN: 10.0000 mg | INTRAMUSCULAR | Status: DC | PRN

## 2023-05-27 MED ORDER — ACETAMINOPHEN 650 MG RE SUPP: 650.0000 mg | Freq: Four times a day (QID) | RECTAL | Status: DC | PRN

## 2023-05-27 MED ORDER — ACETAMINOPHEN 325 MG PO TABS
650.0000 mg | ORAL_TABLET | Freq: Four times a day (QID) | ORAL | Status: DC | PRN
  Administered 2023-05-30 – 2023-06-09 (×3): 650 mg via ORAL
  Filled 2023-05-27 (×3): qty 2

## 2023-05-27 MED ORDER — ONDANSETRON HCL 4 MG/2ML IJ SOLN
4.0000 mg | Freq: Four times a day (QID) | INTRAMUSCULAR | Status: DC | PRN
  Administered 2023-05-29 – 2023-05-30 (×2): 4 mg via INTRAVENOUS
  Filled 2023-05-27 (×3): qty 2

## 2023-05-27 MED ORDER — CALCIUM GLUCONATE-NACL 2-0.675 GM/100ML-% IV SOLN
2.0000 g | Freq: Once | INTRAVENOUS | Status: AC
  Administered 2023-05-27: 2000 mg via INTRAVENOUS
  Filled 2023-05-27: qty 100

## 2023-05-27 NOTE — Progress Notes (Signed)
 PHARMACY - PHYSICIAN COMMUNICATION CRITICAL VALUE ALERT - BLOOD CULTURE IDENTIFICATION (BCID)  Denise Kelly is an 83 y.o. female who presented to [facility] on 05/26/2023 with a chief complaint of right lower extremity wound  Assessment:  1/3 BCX with E. Faecalis, Staph epidermidis, Staph lugdenesis (mecA+)  Name of physician (or Provider) ContactedNelson Chimes MD  Current antibiotics: ceftriaxone   Changes to prescribed antibiotics recommended:  D/c ceftriaxone, start vancomycin to cover all bugs . F/u repeat BCX  Per MD - will add vanc and keep ceftriaxone  Results for orders placed or performed during the hospital encounter of 05/26/23  Blood Culture ID Panel (Reflexed) (Collected: 05/26/2023  8:01 PM)  Result Value Ref Range   Enterococcus faecalis DETECTED (A) NOT DETECTED   Enterococcus Faecium NOT DETECTED NOT DETECTED   Listeria monocytogenes NOT DETECTED NOT DETECTED   Staphylococcus species DETECTED (A) NOT DETECTED   Staphylococcus aureus (BCID) NOT DETECTED NOT DETECTED   Staphylococcus epidermidis DETECTED (A) NOT DETECTED   Staphylococcus lugdunensis DETECTED (A) NOT DETECTED   Streptococcus species NOT DETECTED NOT DETECTED   Streptococcus agalactiae NOT DETECTED NOT DETECTED   Streptococcus pneumoniae NOT DETECTED NOT DETECTED   Streptococcus pyogenes NOT DETECTED NOT DETECTED   A.calcoaceticus-baumannii NOT DETECTED NOT DETECTED   Bacteroides fragilis NOT DETECTED NOT DETECTED   Enterobacterales NOT DETECTED NOT DETECTED   Enterobacter cloacae complex NOT DETECTED NOT DETECTED   Escherichia coli NOT DETECTED NOT DETECTED   Klebsiella aerogenes NOT DETECTED NOT DETECTED   Klebsiella oxytoca NOT DETECTED NOT DETECTED   Klebsiella pneumoniae NOT DETECTED NOT DETECTED   Proteus species NOT DETECTED NOT DETECTED   Salmonella species NOT DETECTED NOT DETECTED   Serratia marcescens NOT DETECTED NOT DETECTED   Haemophilus influenzae NOT DETECTED NOT DETECTED   Neisseria  meningitidis NOT DETECTED NOT DETECTED   Pseudomonas aeruginosa NOT DETECTED NOT DETECTED   Stenotrophomonas maltophilia NOT DETECTED NOT DETECTED   Candida albicans NOT DETECTED NOT DETECTED   Candida auris NOT DETECTED NOT DETECTED   Candida glabrata NOT DETECTED NOT DETECTED   Candida krusei NOT DETECTED NOT DETECTED   Candida parapsilosis NOT DETECTED NOT DETECTED   Candida tropicalis NOT DETECTED NOT DETECTED   Cryptococcus neoformans/gattii NOT DETECTED NOT DETECTED   Methicillin resistance mecA/C DETECTED (A) NOT DETECTED   Vancomycin resistance NOT DETECTED NOT DETECTED    Calton Dach, PharmD, BCCCP Clinical Pharmacist 05/27/2023 4:49 PM

## 2023-05-27 NOTE — ED Notes (Signed)
 Patient changed and barrier cream applied by RN and NT

## 2023-05-27 NOTE — ED Notes (Signed)
 Attempted to call [facility] back multiple times but number they gave is not working. Called online number and no answer either.

## 2023-05-27 NOTE — Hospital Course (Addendum)
 Brief Narrative:  83 year old with history of alcohol use, anxiety, HTN comes to the ED with confusion.  Reports of weeping wound in the right lower extremity found at her living facility sitting in feces and urine.  In the ER noted to have concerns of urinary tract infection, right lower lobe consolidation.  She also reported diarrhea, reportedly has had norovirus going around in her facility.  Eventually cultures grew multiple species with some concerns of possible contaminated therefore repeat surveillance cultures ordered.  Infectious disease also consulted for their input.  In terms of her diarrhea GI panel and C. difficile studies were negative.  Echocardiogram is negative for vegetation.  TEE recommended.   Assessment & Plan:  Principal Problem:   Urinary tract infection   Acute metabolic encephalopathy Multi organism bacteremia - Multifactorial.  Cultures have been reviewed.  ID consulted.  Currently on IV vancomycin, echocardiogram shows preserved EF without evidence of vegetation.  Cardiology notified.  Plans for TEE tomorrow -Repeat cultures remain negative   Diarrhea, resolved - Norovirus cases reported at her facility.  GI panel and C. difficile is negative here.  Hypokalemia/hypocalcemia - As needed repletion    Chronic diastolic CHF, 65% - Overall appears to be compensated.  Prior echocardiogram showing EF of 65%  Chronic venous stasis lower extremity bilaterally - Recently treated 7 days of IV Rocephin about 5 weeks ago  GERD - PPI  PE, per history -Continue outpatient Eliquis  PT/OT-SNF   DVT prophylaxis:Eliquis    Code Status: Full Code Family Communication: Does not want me to call any family Plans for TEE tomorrow    Subjective: No complaints.  Declined TEE today but willing to do it tomorrow  Examination:  General exam: Appears calm and comfortable  Respiratory system: Clear to auscultation. Respiratory effort normal. Cardiovascular system: S1 &  S2 heard, RRR. No JVD, murmurs, rubs, gallops or clicks. No pedal edema. Gastrointestinal system: Abdomen is nondistended, soft and nontender. No organomegaly or masses felt. Normal bowel sounds heard. Central nervous system: Alert and oriented. No focal neurological deficits. Extremities: Symmetric 5 x 5 power. Skin: No rashes, lesions or ulcers Psychiatry: Judgement and insight appear normal. Mood & affect appropriate.

## 2023-05-27 NOTE — H&P (Signed)
 History and Physical    Denise Kelly:[id number] DOB: 04-16-1940 DOA: 05/26/2023  PCP: Eloisa Northern, MD   Chief Complaint: confusion  HPI: Denise Kelly is a 83 y.o. female with medical history significant of alcohol usage, anxiety, hypertension who presents emergency department due to confusion.  Patient was brought from nursing facility and was reportedly disoriented.  She has a wound on her right lower leg that have been weeping.  She was found her living facility to be sitting in feces and urine.  She was transported to the emergency department where she was found to be afebrile hemodynamically stable.  Labs were obtained on an [place] which showed WBC 7.2, hemoglobin 15.7, CMP unrevealing.  Blood cultures pending.  UDS negative, urinalysis concerning for infection.  Patient underwent pelvic x-ray which showed no fractures.  Chest x-ray showed no acute findings.  CTA pulm embolism study demonstrated consolidation of right lower lobe.  Patient was admitted for further workup.  She was started on some CTX.  On dilation she was endorsing diarrhea and states that norovirus is been going on at her facility.  Of note she was admitted for lower extremity cellulitis and was treated with ceftriaxone 1 year ago.   Review of Systems: Review of Systems  Constitutional:  Negative for chills, fever and weight loss.  HENT: Negative.    Eyes: Negative.   Respiratory: Negative.    Cardiovascular: Negative.   Gastrointestinal:  Positive for diarrhea.  Genitourinary: Negative.   Musculoskeletal: Negative.   Skin:  Positive for rash.  Neurological: Negative.   Endo/Heme/Allergies: Negative.   Psychiatric/Behavioral: Negative.       As per HPI otherwise 10 point review of systems negative.   Allergies  Allergen Reactions   Sulfa Antibiotics Hives, Swelling and Other (See Comments)    Facial/eye swelling    Coreg [Carvedilol] Other (See Comments)    Memory loss   Motrin [Ibuprofen]  Palpitations   Toprol Xl [Metoprolol Tartrate] Cough   Doxycycline Hyclate Other (See Comments)    HEARTBURN   Flovent Hfa [Fluticasone] Itching   Statins Other (See Comments)    MENTAL STATUS CHANGE    Past Medical History:  Diagnosis Date   Alcohol abuse    ETOH and xanax   Anxiety    Panic attacks    Arthritis    Cancer (HCC)    skin- basal , squamous , melonoma- right hand   Cellulitis    right leg   Complication of anesthesia 2009   "felt drunk for a week after"- AVM- both times- felt drunk   Depression    Encephalopathy    GERD (gastroesophageal reflux disease)    HOH (hard of hearing)    HOH (hard of hearing)    Hypertension    not on mediacations   Palpitations    PONV (postoperative nausea and vomiting)    Spondylolisthesis of lumbosacral region    UTI (urinary tract infection)    frequently    Past Surgical History:  Procedure Laterality Date   BIOPSY  01/24/2018   Procedure: BIOPSY;  Surgeon: Kerin Salen, MD;  Location: [facility] ENDOSCOPY;  Service: Gastroenterology;;   BREAST SURGERY Right    2 breast biopsies   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   COLONOSCOPY  2011   polyps   ESOPHAGOGASTRODUODENOSCOPY (EGD) WITH PROPOFOL N/A 01/24/2018   Procedure: ESOPHAGOGASTRODUODENOSCOPY (EGD) WITH PROPOFOL;  Surgeon: Kerin Salen, MD;  Location: [facility] ENDOSCOPY;  Service: Gastroenterology;  Laterality:  N/A;   EYE SURGERY Bilateral    cataracts   INTRAMEDULLARY (IM) NAIL INTERTROCHANTERIC Left 11/29/2015   Procedure: LEFT HIP   NAIL;  Surgeon: Sheral Apley, MD;  Location: [facility] OR;  Service: Orthopedics;  Laterality: Left;   JOINT REPLACEMENT Left 09/2009   knee   LUMBAR FUSION  07/17/2016   Lumbar four-five Posterior lumbar interbody fusion (N/A)   TONSILLECTOMY     TOTAL KNEE ARTHROPLASTY Right 01/02/2014   Procedure: TOTAL KNEE ARTHROPLASTY;  Surgeon: Dannielle Huh, MD;  Location: [facility] OR;  Service: Orthopedics;  Laterality: Right;   TUBAL LIGATION        reports that she quit smoking about 31 years ago. She started smoking about 51 years ago. She has a 20 pack-year smoking history. She has never used smokeless tobacco. She reports that she does not drink alcohol and does not use drugs.  Family History  Problem Relation Age of Onset   Hypertension Other    Cancer Mother    Cancer Father     Prior to Admission medications   Medication Sig Start Date End Date Taking? Authorizing Provider  hydrOXYzine (ATARAX) 25 MG tablet Take by mouth. 05/17/23  Yes [provider]  acetaminophen (TYLENOL) 650 MG CR tablet Take 2 tablets (1,300 mg total) by mouth 3 (three) times daily. 1300 mg 3 times daily x 3 to 5 days then recommend changing to 1300 mg 3 times daily as needed for mild pain. 04/28/23   Hongalgi, Maximino Greenland, MD  apixaban (ELIQUIS) 5 MG TABS tablet Take 1 tablet (5 mg total) by mouth 2 (two) times daily. Patient not taking: Reported on 05/27/2023 04/08/23 05/09/23  Lanae Boast, MD  furosemide (LASIX) 20 MG tablet Take 1 tablet (20 mg total) by mouth daily. 04/29/23   Hongalgi, Maximino Greenland, MD  gabapentin (NEURONTIN) 100 MG capsule Take 2 capsules (200 mg total) by mouth 3 (three) times daily. 04/28/23   Hongalgi, Maximino Greenland, MD  methocarbamol (ROBAXIN) 500 MG tablet Take 1 tablet (500 mg total) by mouth every 8 (eight) hours as needed for muscle spasms. 04/28/23   Hongalgi, Maximino Greenland, MD  ondansetron (ZOFRAN) 4 MG tablet Take 1 tablet (4 mg total) by mouth every 8 (eight) hours as needed for nausea or vomiting. 04/08/23   Lanae Boast, MD  phenazopyridine (PYRIDIUM) 100 MG tablet Take 1 tablet (100 mg total) by mouth 3 (three) times daily as needed (dysurea). 04/08/23   Lanae Boast, MD  potassium chloride SA (KLOR-CON M) 10 MEQ tablet Take 1 tablet (10 mEq total) by mouth daily. 04/29/23   Hongalgi, Maximino Greenland, MD  pramipexole (MIRAPEX) 1 MG tablet Take 1 tablet (1 mg total) by mouth every evening. Patient not taking: Reported on 05/27/2023 04/08/23 05/08/23  Lanae Boast, MD  traMADol (ULTRAM) 50 MG tablet Take 1 tablet (50 mg total) by mouth every 6 (six) hours as needed for moderate pain (pain score 4-6) or severe pain (pain score 7-10). After 3 to 5 days, recommend tapering to discontinue as soon as able. 04/28/23   Hongalgi, Maximino Greenland, MD  traZODone (DESYREL) 100 MG tablet Take 1 tablet (100 mg total) by mouth at bedtime as needed for sleep. 04/08/23 05/09/23  Lanae Boast, MD    Physical Exam: Vitals:   05/26/23 2345 05/27/23 0015 05/27/23 0100 05/27/23 0115  BP: (!) 151/83 134/83 (!) 152/81 (!) 157/80  Pulse: 97 (!) 103 (!) 106 92  Resp: 18 13 16 17   Temp:  TempSrc:      SpO2: 96% 98% 96% 95%  Height:       Physical Exam Constitutional:      Appearance: She is obese.  HENT:     Head: Normocephalic.     Nose: Nose normal.     Mouth/Throat:     Mouth: Mucous membranes are moist.     Pharynx: Oropharynx is clear.  Eyes:     Pupils: Pupils are equal, round, and reactive to light.  Cardiovascular:     Rate and Rhythm: Normal rate and regular rhythm.     Pulses: Normal pulses.     Heart sounds: Normal heart sounds.  Pulmonary:     Effort: Pulmonary effort is normal.     Breath sounds: Normal breath sounds.  Abdominal:     General: Abdomen is flat. Bowel sounds are normal.  Musculoskeletal:        General: Normal range of motion.     Cervical back: Normal range of motion.  Skin:    General: Skin is warm.     Capillary Refill: Capillary refill takes less than 2 seconds.  Neurological:     General: No focal deficit present.        Labs on Admission: I have personally reviewed the patients's labs and imaging studies.  Assessment/Plan Principal Problem:   Urinary tract infection   # Acute infectious encephalopathy secondary urinary tract infection - Patient found to have urinalysis concerning for infection in setting of confusion - Reported history of cognitive impairment at baseline  Plan:  continue ceftriaxone Status post  IV fluids  # Diarrhea - Onset of diarrhea was today.  Patient endorsing 5 episodes of watery stool  Plan: Test for C. difficile [facility]  #Chronic venous stasis changes of bilateral lower extremities - Patient asmitted in February with cellulitis and completed course of ceftriaxone.  Will continue to monitor  # Chronic diastolic heart failure, not in exacerbation-patient takes Lasix at home.  Will hold given profuse diarrhea  # GERD-patient not on PPI  # History of PE-patient remains on Eliquis   Admission status: Observation Telemetry Medical  Certification: The appropriate patient status for this patient is OBSERVATION. Observation status is judged to be reasonable and necessary in order to provide the required intensity of service to ensure the patient's safety. The patient's presenting symptoms, physical exam findings, and initial radiographic and laboratory data in the context of their medical condition is felt to place them at decreased risk for further clinical deterioration. Furthermore, it is anticipated that the patient will be medically stable for discharge from the hospital within 2 midnights of admission.     Alan Mulder MD [facility] If 7PM-7AM, please contact night-coverage [email or web address]  05/27/2023, 1:39 AM

## 2023-05-27 NOTE — Progress Notes (Signed)
 Pharmacy Antibiotic Note  Denise Kelly is a 83 y.o. female admitted on 05/26/2023 with bacteremia.  Pharmacy has been consulted for vancomycin dosing.  Plan: Vancomycin 2g IV x1 then vancomycin 1250mg  IV q 24h (eAUC 516) -levels PRN per protocol -goal trough 15-22mcg/mL, Auc 400-600 F/u repeat BCX   Height: 5\' 7"  (170.2 cm) Weight: 99 kg (218 lb 4.1 oz) IBW/kg (Calculated) : 61.6  Temp (24hrs), Avg:97.8 F (36.6 C), Min:97.5 F (36.4 C), Max:98 F (36.7 C)  Recent Labs  Lab 05/26/23 2001 05/26/23 2006 05/27/23 0523  WBC 7.2  --  6.6  CREATININE 0.78 0.70 0.74    Estimated Creatinine Clearance: 66.7 mL/min (by C-G formula based on SCr of 0.74 mg/dL).    Allergies  Allergen Reactions   Sulfa Antibiotics Hives, Swelling and Other (See Comments)    Facial/eye swelling    Coreg [Carvedilol] Other (See Comments)    Memory loss   Motrin [Ibuprofen] Palpitations   Toprol Xl [Metoprolol Tartrate] Cough   Doxycycline Hyclate Other (See Comments)    HEARTBURN   Flovent Hfa [Fluticasone] Itching   Statins Other (See Comments)    MENTAL STATUS CHANGE    Antimicrobials this admission: Ceftriaxone 3/11> Vanc 3/12>  Dose adjustments this admission:  Microbiology results: 3/11: 1/3 BCX E Faecalis, Staph epi, Staph lugdenesis (mecA+) 3/12 Cdiff neg 3/12 GI panel: 3/12 UCX:  Thank you for allowing pharmacy to be a part of this patient's care.  Calton Dach, PharmD, BCCCP Clinical Pharmacist 05/27/2023 5:05 PM

## 2023-05-27 NOTE — ED Notes (Signed)
 Patient changed and cleaned into new brief.

## 2023-05-27 NOTE — ED Notes (Signed)
 Awaiting calcium gluconate from main pharmacy

## 2023-05-27 NOTE — ED Notes (Signed)
 Please give update to [facility] calling from [facility]  [phone],  [phone]

## 2023-05-27 NOTE — Progress Notes (Signed)
 PROGRESS NOTE    Denise Kelly  UJW:[id number] DOB: 29-May-1940 DOA: 05/26/2023 PCP: Eloisa Northern, MD    Brief Narrative:  83 year old with history of alcohol use, anxiety, HTN comes to the ED with confusion.  Reports of weeping wound in the right lower extremity found at her living facility sitting in feces and urine.  In the ER noted to have concerns of urinary tract infection, right lower lobe consolidation.  She also reported diarrhea, reportedly has had norovirus going around in her facility.   Assessment & Plan:  Principal Problem:   Urinary tract infection   Acute metabolic encephalopathy Urine tract infection - Multifactorial.  Concerning for urinary tract infection.  Follow culture data.  Empiric IV Rocephin.  Diarrhea - GI panel has been ordered.  Reportedly there has been cases of norovirus at her facility - IV fluids  Hypokalemia/hypocalcemia - As needed repletion  Chronic diastolic CHF, 65% - Overall appears to be compensated.  Prior echocardiogram showing EF of 65%  Chronic venous stasis lower extremity bilaterally - Recently treated 7 days of IV Rocephin about 5 weeks ago  GERD - PPI  PE, per history -Continue outpatient Eliquis   DVT prophylaxis: SCDs Start: 05/27/23 0138 apixaban (ELIQUIS) tablet 5 mg      Code Status: Full Code Family Communication:   Status is: In.  Continue hospital stay for diarrhea evaluation and IV antibiotics    Subjective: Answering basic questions.  Does not have any complaints at this time. Tells me her abdomen feels slightly queasy  Examination:  General exam: Appears calm and comfortable  Respiratory system: Clear to auscultation. Respiratory effort normal. Cardiovascular system: S1 & S2 heard, RRR. No JVD, murmurs, rubs, gallops or clicks. No pedal edema. Gastrointestinal system: Abdomen is nondistended, soft and nontender. No organomegaly or masses felt. Normal bowel sounds heard. Central nervous system: Alert  and oriented. No focal neurological deficits. Extremities: Symmetric 5 x 5 power. Skin: No rashes, lesions or ulcers Psychiatry: Judgement and insight appear normal. Mood & affect appropriate.                Diet Orders (From admission, onward)     Start     Ordered   05/27/23 0138  Diet regular Room service appropriate? Yes; Fluid consistency: Thin  Diet effective now       Question Answer Comment  Room service appropriate? Yes   Fluid consistency: Thin      05/27/23 0138            Objective: Vitals:   05/27/23 0900 05/27/23 1058 05/27/23 1115 05/27/23 1130  BP: (!) 143/111  126/79 109/68  Pulse: (!) 102  95 96  Resp: 18  19 15   Temp:  97.7 F (36.5 C)    TempSrc:  Oral    SpO2: 91%  90% 90%  Weight:      Height:        Intake/Output Summary (Last 24 hours) at 05/27/2023 1341 Last data filed at 05/27/2023 0630 Gross per 24 hour  Intake 1116.56 ml  Output --  Net 1116.56 ml   Filed Weights   05/27/23 0115  Weight: 99 kg    Scheduled Meds:  apixaban  5 mg Oral BID   gabapentin  200 mg Oral TID   Continuous Infusions:  cefTRIAXone (ROCEPHIN)  IV     potassium chloride 10 mEq (05/27/23 1215)    Nutritional status     Body mass index is 34.18 kg/m.  Data Reviewed:  CBC: Recent Labs  Lab 05/26/23 2001 05/26/23 2006 05/27/23 0523  WBC 7.2  --  6.6  NEUTROABS 4.9  --   --   HGB 15.7* 15.3* 14.9  HCT 46.3* 45.0 44.1  MCV 84.2  --  84.5  PLT 221  --  193   Basic Metabolic Panel: Recent Labs  Lab 05/26/23 2001 05/26/23 2006 05/27/23 0523  NA 140 141 139  K 3.5 3.6 3.0*  CL 102 104 109  CO2 23  --  23  GLUCOSE 134* 130* 97  BUN 11 14 10   CREATININE 0.78 0.70 0.74  CALCIUM 8.9  --  8.5*  MG 2.0  --   --    GFR: Estimated Creatinine Clearance: 66.7 mL/min (by C-G formula based on SCr of 0.74 mg/dL). Liver Function Tests: Recent Labs  Lab 05/26/23 2001  AST 36  ALT 24  ALKPHOS 81  BILITOT 1.6*  PROT 7.1  ALBUMIN 3.5    No results for input(s): "LIPASE", "AMYLASE" in the last 168 hours. No results for input(s): "AMMONIA" in the last 168 hours. Coagulation Profile: No results for input(s): "INR", "PROTIME" in the last 168 hours. Cardiac Enzymes: No results for input(s): "CKTOTAL", "CKMB", "CKMBINDEX", "TROPONINI" in the last 168 hours. BNP (last 3 results) No results for input(s): "PROBNP" in the last 8760 hours. HbA1C: No results for input(s): "HGBA1C" in the last 72 hours. CBG: No results for input(s): "GLUCAP" in the last 168 hours. Lipid Profile: No results for input(s): "CHOL", "HDL", "LDLCALC", "TRIG", "CHOLHDL", "LDLDIRECT" in the last 72 hours. Thyroid Function Tests: No results for input(s): "TSH", "T4TOTAL", "FREET4", "T3FREE", "THYROIDAB" in the last 72 hours. Anemia Panel: No results for input(s): "VITAMINB12", "FOLATE", "FERRITIN", "TIBC", "IRON", "RETICCTPCT" in the last 72 hours. Sepsis Labs: No results for input(s): "PROCALCITON", "LATICACIDVEN" in the last 168 hours.  Recent Results (from the past 240 hours)  Culture, blood (Routine X 2) w Reflex to ID Panel     Status: None (Preliminary result)   Collection Time: 05/26/23  8:01 PM   Specimen: BLOOD  Result Value Ref Range Status   Specimen Description BLOOD LEFT ANTECUBITAL  Final   Special Requests   Final    BOTTLES DRAWN AEROBIC AND ANAEROBIC Blood Culture adequate volume   Culture   Final    NO GROWTH < 12 HOURS Performed at [facility], [address] [place], [place] [address]    Report Status PENDING  Incomplete  Culture, blood (Routine X 2) w Reflex to ID Panel     Status: None (Preliminary result)   Collection Time: 05/26/23  8:07 PM   Specimen: BLOOD LEFT ARM  Result Value Ref Range Status   Specimen Description BLOOD LEFT ARM  Final   Special Requests   Final    BOTTLES DRAWN AEROBIC ONLY Blood Culture results may not be optimal due to an inadequate volume of blood received in culture bottles   Culture    Final    NO GROWTH < 12 HOURS Performed at [facility], [address] [place], [place] [address]    Report Status PENDING  Incomplete         Radiology Studies: No results found.         LOS: 0 days   Time spent= 35 mins    Miguel Rota, MD [facility]  If 7PM-7AM, please contact night-coverage  05/27/2023, 1:41 PM

## 2023-05-28 ENCOUNTER — Inpatient Hospital Stay (HOSPITAL_COMMUNITY): Payer: Medicare (Managed Care)

## 2023-05-28 DIAGNOSIS — S81801A Unspecified open wound, right lower leg, initial encounter: Secondary | ICD-10-CM | POA: Diagnosis not present

## 2023-05-28 DIAGNOSIS — A4102 Sepsis due to Methicillin resistant Staphylococcus aureus: Secondary | ICD-10-CM | POA: Diagnosis not present

## 2023-05-28 DIAGNOSIS — I38 Endocarditis, valve unspecified: Secondary | ICD-10-CM

## 2023-05-28 DIAGNOSIS — R197 Diarrhea, unspecified: Secondary | ICD-10-CM | POA: Diagnosis not present

## 2023-05-28 DIAGNOSIS — B952 Enterococcus as the cause of diseases classified elsewhere: Secondary | ICD-10-CM | POA: Diagnosis not present

## 2023-05-28 DIAGNOSIS — N3000 Acute cystitis without hematuria: Secondary | ICD-10-CM | POA: Diagnosis not present

## 2023-05-28 LAB — CBC
HCT: 45.8 % (ref 36.0–46.0)
Hemoglobin: 15.1 g/dL — ABNORMAL HIGH (ref 12.0–15.0)
MCH: 28.1 pg (ref 26.0–34.0)
MCHC: 33 g/dL (ref 30.0–36.0)
MCV: 85.3 fL (ref 80.0–100.0)
Platelets: 162 10*3/uL (ref 150–400)
RBC: 5.37 MIL/uL — ABNORMAL HIGH (ref 3.87–5.11)
RDW: 14.3 % (ref 11.5–15.5)
WBC: 5.9 10*3/uL (ref 4.0–10.5)
nRBC: 0 % (ref 0.0–0.2)

## 2023-05-28 LAB — BASIC METABOLIC PANEL
Anion gap: 11 (ref 5–15)
BUN: 7 mg/dL — ABNORMAL LOW (ref 8–23)
CO2: 22 mmol/L (ref 22–32)
Calcium: 8.5 mg/dL — ABNORMAL LOW (ref 8.9–10.3)
Chloride: 106 mmol/L (ref 98–111)
Creatinine, Ser: 0.48 mg/dL (ref 0.44–1.00)
GFR, Estimated: >60 mL/min (ref >60)
Glucose, Bld: 86 mg/dL (ref 70–99)
Potassium: 3.1 mmol/L — ABNORMAL LOW (ref 3.5–5.1)
Sodium: 139 mmol/L (ref 135–145)

## 2023-05-28 LAB — ECHOCARDIOGRAM COMPLETE
AR max vel: 1.65 cm2
AV Area VTI: 1.37 cm2
AV Area mean vel: 1.32 cm2
AV Mean grad: 5 mmHg
AV Peak grad: 7.4 mmHg
Ao pk vel: 1.36 m/s
Area-P 1/2: 3.17 cm2
Calc EF: 64.8 %
Height: 67 in
MV VTI: 2.53 cm2
S' Lateral: 2.5 cm
Single Plane A2C EF: 63.4 %
Single Plane A4C EF: 68.9 %
Weight: 3492.09 [oz_av]

## 2023-05-28 LAB — GASTROINTESTINAL PANEL BY PCR, STOOL (REPLACES STOOL CULTURE)

## 2023-05-28 LAB — PHOSPHORUS: Phosphorus: 3.2 mg/dL (ref 2.5–4.6)

## 2023-05-28 LAB — MAGNESIUM: Magnesium: 1.7 mg/dL (ref 1.7–2.4)

## 2023-05-28 MED ORDER — HYDROCERIN EX CREA
TOPICAL_CREAM | Freq: Two times a day (BID) | CUTANEOUS | Status: DC
  Filled 2023-05-28 (×2): qty 113

## 2023-05-28 MED ORDER — SODIUM CHLORIDE 0.9 % IV SOLN
1250.0000 mg | INTRAVENOUS | Status: DC
  Administered 2023-05-28 – 2023-05-30 (×3): 1250 mg via INTRAVENOUS
  Filled 2023-05-28 (×3): qty 25

## 2023-05-28 MED ORDER — POTASSIUM CHLORIDE CRYS ER 20 MEQ PO TBCR
40.0000 meq | EXTENDED_RELEASE_TABLET | ORAL | Status: AC
  Administered 2023-05-28 (×2): 40 meq via ORAL
  Filled 2023-05-28 (×2): qty 2

## 2023-05-28 NOTE — TOC Initial Note (Signed)
 Transition of Care [facility]) - Initial/Assessment Note    Patient Details  Name: Denise Kelly MRN: [id number] Date of Birth: 01-04-1941  Transition of Care [facility]) CM/SW Contact:    Marque Rademaker A [place], LCSW Phone Number: 05/28/2023, 4:11 PM  Clinical Narrative:                  CSW spoke with pt at bedside, she stated that she was agreeable to short term rehab at [facility]. She said that she lives at [facility]? ILF. She stated that she had been to short term rehab at [facility] recently and was discharged from there back to her senior living community.   CSW explained SNF process, no additional questions at this time. Medicare.gov ratings to be provided once with pt's bed offers. Bed offers currently pending.  Pt stated that her contacts, she requested CSW decline to reach out to those contacts.   TOC will continue to follow.   Expected Discharge Plan: Skilled Nursing Facility Barriers to Discharge: SNF Pending bed offer, Continued Medical Work up, English as a second language teacher   Patient Goals and CMS Choice            Expected Discharge Plan and Services In-house Referral: Clinical Social Work     Living arrangements for the past 2 months: Independent Living Facility                                      Prior Living Arrangements/Services Living arrangements for the past 2 months: Independent Living Facility Lives with:: Self            Care giver support system in place?: No (comment)      Activities of Daily Living   ADL Screening (condition at time of admission) Independently performs ADLs?: No Does the patient have a NEW difficulty with bathing/dressing/toileting/self-feeding that is expected to last >3 days?: No Does the patient have a NEW difficulty with getting in/out of bed, walking, or climbing stairs that is expected to last >3 days?: No Does the patient have a NEW difficulty with communication that is expected to last >3 days?: No Is the patient deaf or have  difficulty hearing?: Yes Does the patient have difficulty seeing, even when wearing glasses/contacts?: No Does the patient have difficulty concentrating, remembering, or making decisions?: No  Permission Sought/Granted                  Emotional Assessment Appearance:: Appears older than stated age Attitude/Demeanor/Rapport: Inconsistent Affect (typically observed): Appropriate Orientation: : Oriented to Self, Oriented to Place, Oriented to Situation Alcohol / Substance Use: Not Applicable Psych Involvement: No (comment)  Admission diagnosis:  Urinary tract infection [N39.0] Acute cystitis without hematuria [N30.00] Patient Active Problem List   Diagnosis Date Noted   Urinary tract infection 05/27/2023   Acute cystitis 04/03/2023   Lower extremity cellulitis 04/03/2023   Bilateral lower extremity pain 05/21/2022   FTT (failure to thrive) in adult 05/01/2022   Chronic diastolic CHF (congestive heart failure) (HCC) 04/30/2022   GERD without esophagitis 04/30/2022   Right hip pain 04/30/2022   Generalized weakness 04/28/2022   Cellulitis of bilateral lower extremities 04/20/2022   Lymphedema 04/20/2022   Personal history of thromboembolic disease 40/98/1191   History of pulmonary embolism 04/20/2022   UTI (urinary tract infection) 01/13/2021   Acute metabolic encephalopathy 01/13/2021   Upper GI bleed 01/23/2018   Leukocytosis 01/23/2018   Physical deconditioning 01/23/2018  Pulmonary embolism (HCC) 01/04/2018   Acute encephalopathy 09/28/2017   Constipation, slow transit 09/28/2017   Unsteady gait 04/28/2017   Spondylolisthesis 07/17/2016   Lymphedema of both lower extremities 07/17/2016   Pain syndrome, chronic    Chronic depression 05/23/2016   Chronic back pain 02/25/2016   Lesion of liver 02/25/2016   History of fracture of left hip 11/28/2015   Fall 11/28/2015   RLS (restless legs syndrome) 11/28/2015   Hypokalemia 03/14/2014   Confusion 03/14/2014    Recurrent UTI 03/14/2014   Effusion of right knee 02/22/2014   Alcohol dependence (HCC) 02/22/2014   Substance induced mood disorder (HCC) 02/22/2014   Hepatic encephalopathy (HCC)    Altered mental status 02/21/2014   SIRS (systemic inflammatory response syndrome) (HCC) 02/21/2014   S/P total knee arthroplasty 01/02/2014   Essential hypertension 11/10/2013   LAFB (left anterior fascicular block) 11/10/2013   Obesity, unspecified 11/10/2013   PCP:  Eloisa Northern, MD Pharmacy:   [id number] [place], [place] - [address] [place] [address] Phone: [phone] Fax: [phone]  [place] [facility] [address] [place] [address] Phone: [phone] Fax: [phone]  [facility] [place], [place] - [address] [place] [address] Phone: [phone] Fax: [phone]     Social Drivers of Health (SDOH) Social History: SDOH Screenings   Food Insecurity: No Food Insecurity (05/27/2023)  Housing: Low Risk  (05/27/2023)  Transportation Needs: No Transportation Needs (05/27/2023)  Utilities: Not At Risk (05/27/2023)  Depression (PHQ2-9): Low Risk  (01/09/2019)  Social Connections: Socially Isolated (05/27/2023)  Tobacco Use: Medium Risk (05/26/2023)   SDOH Interventions:     Readmission Risk Interventions    04/08/2023    1:02 PM 04/06/2023    3:38 PM 05/02/2022   11:19 AM  Readmission Risk Prevention Plan  Transportation Screening Complete Complete Complete  PCP or Specialist Appt within 3-5 Days Complete Complete   HRI or Home Care Consult Complete Complete   Social Work Consult for Recovery Care Planning/Counseling Complete Complete   Palliative Care Screening Complete Not Applicable   Medication Review Oceanographer) Complete Complete Complete  PCP or Specialist appointment within 3-5 days of discharge   Complete  HRI or Home Care Consult   Complete   SW Recovery Care/Counseling Consult   Complete  Palliative Care Screening   Not Applicable  Skilled Nursing Facility   Complete

## 2023-05-28 NOTE — NC FL2 (Signed)
 [place] MEDICAID FL2 LEVEL OF CARE FORM     IDENTIFICATION  Patient Name: Denise Kelly Birthdate: [birth year] Sex: female Admission Date (Current Location): 05/26/2023  [facility] and [place] Number:  Producer, television/film/video and Address:  [facility], [address], [place], [place] [address]      Provider Number: [id number]  Attending Physician Name and Address:  Miguel Rota, MD  Relative Name and Phone Number:  [name] (Other)  [phone]    Current Level of Care: Hospital Recommended Level of Care: Skilled Nursing Facility Prior Approval Number:    Date Approved/Denied:   PASRR Number: [id number]  Discharge Plan: SNF    Current Diagnoses: Patient Active Problem List   Diagnosis Date Noted   Urinary tract infection 05/27/2023   Acute cystitis 04/03/2023   Lower extremity cellulitis 04/03/2023   Bilateral lower extremity pain 05/21/2022   FTT (failure to thrive) in adult 05/01/2022   Chronic diastolic CHF (congestive heart failure) (HCC) 04/30/2022   GERD without esophagitis 04/30/2022   Right hip pain 04/30/2022   Generalized weakness 04/28/2022   Cellulitis of bilateral lower extremities 04/20/2022   Lymphedema 04/20/2022   Personal history of thromboembolic disease 84/69/6295   History of pulmonary embolism 04/20/2022   UTI (urinary tract infection) 01/13/2021   Acute metabolic encephalopathy 01/13/2021   Upper GI bleed 01/23/2018   Leukocytosis 01/23/2018   Physical deconditioning 01/23/2018   Pulmonary embolism (HCC) 01/04/2018   Acute encephalopathy 09/28/2017   Constipation, slow transit 09/28/2017   Unsteady gait 04/28/2017   Spondylolisthesis 07/17/2016   Lymphedema of both lower extremities 07/17/2016   Pain syndrome, chronic    Chronic depression 05/23/2016   Chronic back pain 02/25/2016   Lesion of liver 02/25/2016   History of fracture of left hip 11/28/2015   Fall 11/28/2015   RLS (restless legs syndrome)  11/28/2015   Hypokalemia 03/14/2014   Confusion 03/14/2014   Recurrent UTI 03/14/2014   Effusion of right knee 02/22/2014   Alcohol dependence (HCC) 02/22/2014   Substance induced mood disorder (HCC) 02/22/2014   Hepatic encephalopathy (HCC)    Altered mental status 02/21/2014   SIRS (systemic inflammatory response syndrome) (HCC) 02/21/2014   S/P total knee arthroplasty 01/02/2014   Essential hypertension 11/10/2013   LAFB (left anterior fascicular block) 11/10/2013   Obesity, unspecified 11/10/2013    Orientation RESPIRATION BLADDER Height & Weight     Self, Time, Situation, Place  Normal Incontinent, External catheter Weight: 218 lb 4.1 oz (99 kg) Height:  5\' 7"  (170.2 cm)  BEHAVIORAL SYMPTOMS/MOOD NEUROLOGICAL BOWEL NUTRITION STATUS      Continent Diet (see DC summary)  AMBULATORY STATUS COMMUNICATION OF NEEDS Skin   Extensive Assist Verbally Other (Comment), PU Stage and Appropriate Care (Pressure Injury Buttocks Bilateral Stage 1Intact skin with non-blanchable redness of a localized area usually over a bony prominence. Breast Lower;Right Red, inflamed, smells of yeast. Irritant Dermatitis. Breast Left;Lower Red, inflamed, smells of yeast)                       Personal Care Assistance Level of Assistance  Feeding, Dressing, Bathing Bathing Assistance: Maximum assistance Feeding assistance: Limited assistance Dressing Assistance: Maximum assistance     Functional Limitations Info  Sight, Hearing, Speech Sight Info: Impaired (wears glasses) Hearing Info: Impaired Speech Info: Adequate    SPECIAL CARE FACTORS FREQUENCY  PT (By licensed PT), OT (By licensed OT)     PT Frequency: 5x/week OT Frequency:  5x/week            Contractures Contractures Info: Not present    Additional Factors Info  Code Status, Allergies Code Status Info: FULL Allergies Info: Sulfa Antibiotics  Coreg (Carvedilol)  Motrin (Ibuprofen)  Toprol Xl (Metoprolol Tartrate)  Doxycycline  Hyclate  Flovent Hfa (Fluticasone)  Statins           Current Medications (05/28/2023):  This is the current hospital active medication list Current Facility-Administered Medications  Medication Dose Route Frequency Provider Last Rate Last Admin   acetaminophen (TYLENOL) tablet 650 mg  650 mg Oral Q6H PRN Alan Mulder, MD       Or   acetaminophen (TYLENOL) suppository 650 mg  650 mg Rectal Q6H PRN Alan Mulder, MD       apixaban Everlene Balls) tablet 5 mg  5 mg Oral BID Alan Mulder, MD   5 mg at 05/28/23 0915   gabapentin (NEURONTIN) capsule 200 mg  200 mg Oral TID Alan Mulder, MD   200 mg at 05/28/23 1512   hydrALAZINE (APRESOLINE) injection 10 mg  10 mg Intravenous Q4H PRN Amin, Ankit C, MD       hydrocerin (EUCERIN) cream   Topical BID Amin, Ankit C, MD       ipratropium-albuterol (DUONEB) 0.5-2.5 (3) MG/3ML nebulizer solution 3 mL  3 mL Nebulization Q4H PRN Amin, Ankit C, MD       metoprolol tartrate (LOPRESSOR) injection 5 mg  5 mg Intravenous Q4H PRN Amin, Ankit C, MD       ondansetron (ZOFRAN) tablet 4 mg  4 mg Oral Q6H PRN Alan Mulder, MD   4 mg at 05/27/23 1604   Or   ondansetron (ZOFRAN) injection 4 mg  4 mg Intravenous Q6H PRN Alan Mulder, MD       oxyCODONE (Oxy IR/ROXICODONE) immediate release tablet 5 mg  5 mg Oral Q4H PRN Alan Mulder, MD   5 mg at 05/28/23 1512   rOPINIRole (REQUIP) tablet 0.5 mg  0.5 mg Oral TID PRN Amin, Ankit C, MD       Vancomycin (VANCOCIN) 1,250 mg in sodium chloride 0.9 % 250 mL IVPB  1,250 mg Intravenous Q24H Amin, Ankit C, MD         Discharge Medications: Please see discharge summary for a list of discharge medications.  Relevant Imaging Results:  Relevant Lab Results:   Additional Information SS# [id number]  Clive Parcel [place], [place]

## 2023-05-28 NOTE — Evaluation (Signed)
 Physical Therapy Evaluation Patient Details Name: Denise Kelly MRN: [id number] DOB: 1940/09/19 Today's Date: 05/28/2023  History of Present Illness  83 y.o. presents to [facility] 05/26/23 due to weeping wound in the RLE found at living facility sitting in feces and urine. Urinalysis concerning for infection and CTA with signs of consolidation of R lower lobe.  Recent admit 2/4, 1/17 and 1/22 for UTI and BLE cellulitis. PMHx: alcohol abuse, anxiety/depression, GERD, HTN, HOH, obesity, chronic diastolic CHF, PE  Clinical Impression  Pt is presenting below baseline level of functioning. Pt currently was Min A for rolling R/L in the bed to assist with peri care. Pt is very concerned about her discharge and has difficulty differentiating between Rehab at skilled nursing, ALF and ILF. Discussed the role of physical therapy in acute care hospital setting if her goal is to go to rehab at skilled nursing facility and the importance of participation; including the role of physical therapy in assisting her to improve her mobility. Pt was educated on the role of ILF and ALF within PT scope of practice including that the decision she chooses is a personal decision if she wants to participate in skilled physical therapy services to improve mobility and a decision her/her family and her MD will need to make based on medical diagnosis, current mobility and prognosis as well as pt desire for a certain quality of life. Pt stated understanding and asked to be provided with further information on various placements. Case management notified. At this time recommending skilled physical therapy services < 3/hours/day in order to improve strength and functional mobility to improve independence and decrease risk for falls on discharge from acute care hospital setting.       If plan is discharge home, recommend the following: A lot of help with walking and/or transfers;Assistance with cooking/housework;Assist for  transportation;Supervision due to cognitive status   Can travel by private vehicle   No    Equipment Recommendations Wheelchair cushion (measurements PT);Wheelchair (measurements PT);Hoyer lift;Hospital bed     Functional Status Assessment Patient has had a recent decline in their functional status and demonstrates the ability to make significant improvements in function in a reasonable and predictable amount of time.     Precautions / Restrictions Precautions Precautions: Fall Recall of Precautions/Restrictions: Impaired Restrictions Weight Bearing Restrictions Per Provider Order: No      Mobility  Bed Mobility Overal bed mobility: Needs Assistance Bed Mobility: Rolling Rolling: Min assist, Used rails         General bed mobility comments: rolling R/L pt requires MIn A to get fully on her side. Able to hold with bed rail for ~2 min to participate in peri care.    Transfers   General transfer comment: Pt declined at this time.          Pertinent Vitals/Pain Pain Assessment Pain Assessment: Faces Faces Pain Scale: Hurts a little bit Pain Location: bil LE Pain Descriptors / Indicators: Discomfort, Grimacing Pain Intervention(s): Monitored during session, Limited activity within patient's tolerance    Home Living Family/patient expects to be discharged to:: Assisted living                 Home Equipment: Agricultural consultant (2 wheels) Additional Comments: difficult to get history, pt wears O2 at home    Prior Function Prior Level of Function : Needs assist       Physical Assist : Mobility (physical);ADLs (physical) Mobility (physical): Gait ADLs (physical): Bathing;Dressing;IADLs Mobility Comments: Pt states she can't stand  up right now ADLs Comments: Pt reports she sponge bathes at baseline and does not receive any assistance for ADL tasks. ADL completion has been progressively more difficult     Extremity/Trunk Assessment   Upper Extremity  Assessment Upper Extremity Assessment: Defer to OT evaluation    Lower Extremity Assessment Lower Extremity Assessment: Generalized weakness    Cervical / Trunk Assessment Cervical / Trunk Assessment: Normal  Communication   Communication Communication: Impaired Factors Affecting Communication: Hearing impaired    Cognition Arousal: Alert Behavior During Therapy: Agitated, Anxious   PT - Cognitive impairments: No family/caregiver present to determine baseline       Following commands: Intact       Cueing Cueing Techniques: Verbal cues     General Comments General comments (skin integrity, edema, etc.): Pt has bil LE swelling. Redness noted in the periarea and buttocks.        Assessment/Plan    PT Assessment Patient needs continued PT services  PT Problem List Decreased strength;Decreased mobility       PT Treatment Interventions DME instruction;Therapeutic activities;Gait training;Therapeutic exercise;Balance training;Functional mobility training;Patient/family education;Wheelchair mobility training    PT Goals (Current goals can be found in the Care Plan section)  Acute Rehab PT Goals Patient Stated Goal: to get more information to make informed decisions PT Goal Formulation: With patient Time For Goal Achievement: 06/11/23 Potential to Achieve Goals: Good    Frequency Min 1X/week        AM-PAC PT "6 Clicks" Mobility  Outcome Measure Help needed turning from your back to your side while in a flat bed without using bedrails?: A Little Help needed moving from lying on your back to sitting on the side of a flat bed without using bedrails?: Total Help needed moving to and from a bed to a chair (including a wheelchair)?: Total Help needed standing up from a chair using your arms (e.g., wheelchair or bedside chair)?: Total Help needed to walk in hospital room?: Total Help needed climbing 3-5 steps with a railing? : Total 6 Click Score: 8    End of  Session   Activity Tolerance: Other (comment);Treatment limited secondary to agitation;Patient tolerated treatment well Patient left: in bed;with bed alarm set;with call bell/phone within reach Nurse Communication: Mobility status;Other (comment) (redness in the peri area.) PT Visit Diagnosis: Other abnormalities of gait and mobility (R26.89);Muscle weakness (generalized) (M62.81)    Time: 4782-9562 PT Time Calculation (min) (ACUTE ONLY): 40 min   Charges:   PT Evaluation $PT Eval Low Complexity: 1 Low PT Treatments $Therapeutic Activity: 23-37 mins PT General Charges $$ ACUTE PT VISIT: 1 Visit         Harrel Carina, DPT, CLT  Acute Rehabilitation Services Office: [phone] (Secure chat preferred)   Claudia Desanctis 05/28/2023, 1:07 PM

## 2023-05-28 NOTE — Progress Notes (Signed)
 Pt refusing VS. This RN attempted VS and pt states "leave me alone, go away!" Primary RN aware.

## 2023-05-28 NOTE — Progress Notes (Signed)
 OT Cancellation Note  Patient Details Name: AHLEY BULLS MRN: [id number] DOB: [birth year]   Cancelled Treatment:    Reason Eval/Treat Not Completed: (P) Patient declined, no reason specified. Pt adamant about not answering questions and not participating in therapy, she told me not to return later and to come back tomorrow. Will reattempt as able.   Alexis Goodell 05/28/2023, 10:56 AM

## 2023-05-28 NOTE — Progress Notes (Signed)
 PROGRESS NOTE    Denise Kelly  IRJ:[id number] DOB: May 06, 1940 DOA: 05/26/2023 PCP: Eloisa Northern, MD    Brief Narrative:  83 year old with history of alcohol use, anxiety, HTN comes to the ED with confusion.  Reports of weeping wound in the right lower extremity found at her living facility sitting in feces and urine.  In the ER noted to have concerns of urinary tract infection, right lower lobe consolidation.  She also reported diarrhea, reportedly has had norovirus going around in her facility.  Eventually cultures grew multiple species with some concerns of possible contaminated therefore repeat surveillance cultures ordered.  Infectious disease also consulted for their input.  In terms of her diarrhea GI panel and C. difficile studies were negative.   Assessment & Plan:  Principal Problem:   Urinary tract infection   Acute metabolic encephalopathy Urine tract infection - Multifactorial.  Concerning for urinary tract infection.  Blood cultures are growing GPC/Enterococcus/Staph epidermidis.  There is some concerns of contaminate.  Continue vancomycin and Rocephin.  Repeat surveillance cultures have been ordered.  Consulted ID.  Echocardiogram ordered  Diarrhea - Norovirus cases reported at her facility.  GI panel and C. difficile is negative here.  Hypokalemia/hypocalcemia - As needed repletion  Chronic diastolic CHF, 65% - Overall appears to be compensated.  Prior echocardiogram showing EF of 65%  Chronic venous stasis lower extremity bilaterally - Recently treated 7 days of IV Rocephin about 5 weeks ago  GERD - PPI  PE, per history -Continue outpatient Eliquis   DVT prophylaxis:Eliquis    Code Status: Full Code Family Communication: Does not want me to call any family Status is: In.  Continue hospital stay for diarrhea evaluation and IV antibiotics    Subjective: Answers all the basic questions.  Tells me overall she feels weak but no specific  complaints  Examination:  General exam: Appears calm and comfortable  Respiratory system: Clear to auscultation. Respiratory effort normal. Cardiovascular system: S1 & S2 heard, RRR. No JVD, murmurs, rubs, gallops or clicks. No pedal edema. Gastrointestinal system: Abdomen is nondistended, soft and nontender. No organomegaly or masses felt. Normal bowel sounds heard. Central nervous system: Alert and oriented. No focal neurological deficits. Extremities: Symmetric 5 x 5 power. Skin: No rashes, lesions or ulcers Psychiatry: Judgement and insight appear normal. Mood & affect appropriate.                Diet Orders (From admission, onward)     Start     Ordered   05/27/23 0138  Diet regular Room service appropriate? Yes; Fluid consistency: Thin  Diet effective now       Question Answer Comment  Room service appropriate? Yes   Fluid consistency: Thin      05/27/23 0138            Objective: Vitals:   05/27/23 1600 05/28/23 0100 05/28/23 0607 05/28/23 0854  BP: 133/68 (P) 136/79 128/67 135/64  Pulse: 95 (!) (P) 104 84 83  Resp: 18 (P) 18 18 18   Temp: 97.8 F (36.6 C) (P) 98.1 F (36.7 C) 98.8 F (37.1 C) 97.7 F (36.5 C)  TempSrc: Oral (P) Oral  Oral  SpO2: 96%  95% 93%  Weight:      Height:        Intake/Output Summary (Last 24 hours) at 05/28/2023 1129 Last data filed at 05/28/2023 1031 Gross per 24 hour  Intake 240 ml  Output 500 ml  Net -260 ml   [employer]  05/27/23 0115  Weight: 99 kg    Scheduled Meds:  apixaban  5 mg Oral BID   gabapentin  200 mg Oral TID   potassium chloride  40 mEq Oral Q4H   Continuous Infusions:  vancomycin      Nutritional status     Body mass index is 34.18 kg/m.  Data Reviewed:   CBC: Recent Labs  Lab 05/26/23 2001 05/26/23 2006 05/27/23 0523 05/28/23 0841  WBC 7.2  --  6.6 5.9  NEUTROABS 4.9  --   --   --   HGB 15.7* 15.3* 14.9 15.1*  HCT 46.3* 45.0 44.1 45.8  MCV 84.2  --  84.5 85.3  PLT  221  --  193 162   Basic Metabolic Panel: Recent Labs  Lab 05/26/23 2001 05/26/23 2006 05/27/23 0523 05/28/23 0841  NA 140 141 139 139  K 3.5 3.6 3.0* 3.1*  CL 102 104 109 106  CO2 23  --  23 22  GLUCOSE 134* 130* 97 86  BUN 11 14 10  7*  CREATININE 0.78 0.70 0.74 0.48  CALCIUM 8.9  --  8.5* 8.5*  MG 2.0  --   --  1.7  PHOS  --   --   --  3.2   GFR: Estimated Creatinine Clearance: 66.7 mL/min (by C-G formula based on SCr of 0.48 mg/dL). Liver Function Tests: Recent Labs  Lab 05/26/23 2001  AST 36  ALT 24  ALKPHOS 81  BILITOT 1.6*  PROT 7.1  ALBUMIN 3.5   No results for input(s): "LIPASE", "AMYLASE" in the last 168 hours. No results for input(s): "AMMONIA" in the last 168 hours. Coagulation Profile: No results for input(s): "INR", "PROTIME" in the last 168 hours. Cardiac Enzymes: No results for input(s): "CKTOTAL", "CKMB", "CKMBINDEX", "TROPONINI" in the last 168 hours. BNP (last 3 results) No results for input(s): "PROBNP" in the last 8760 hours. HbA1C: No results for input(s): "HGBA1C" in the last 72 hours. CBG: No results for input(s): "GLUCAP" in the last 168 hours. Lipid Profile: No results for input(s): "CHOL", "HDL", "LDLCALC", "TRIG", "CHOLHDL", "LDLDIRECT" in the last 72 hours. Thyroid Function Tests: No results for input(s): "TSH", "T4TOTAL", "FREET4", "T3FREE", "THYROIDAB" in the last 72 hours. Anemia Panel: No results for input(s): "VITAMINB12", "FOLATE", "FERRITIN", "TIBC", "IRON", "RETICCTPCT" in the last 72 hours. Sepsis Labs: No results for input(s): "PROCALCITON", "LATICACIDVEN" in the last 168 hours.  Recent Results (from the past 240 hours)  Culture, blood (Routine X 2) w Reflex to ID Panel     Status: None (Preliminary result)   Collection Time: 05/26/23  8:01 PM   Specimen: BLOOD  Result Value Ref Range Status   Specimen Description BLOOD LEFT ANTECUBITAL  Final   Special Requests   Final    BOTTLES DRAWN AEROBIC AND ANAEROBIC Blood  Culture adequate volume   Culture  Setup Time   Final    GRAM POSITIVE COCCI IN CHAINS IN BOTH AEROBIC AND ANAEROBIC BOTTLES CRITICAL RESULT CALLED TO, READ BACK BY AND VERIFIED WITH: Chana Bode [id number] @1646  FH Performed at [facility], [address] [place], [place] [address]    Culture GRAM POSITIVE COCCI IN CHAINS  Final   Report Status PENDING  Incomplete  Blood Culture ID Panel (Reflexed)     Status: Abnormal   Collection Time: 05/26/23  8:01 PM  Result Value Ref Range Status   Enterococcus faecalis DETECTED (A) NOT DETECTED Final    Comment: CRITICAL RESULT CALLED TO, READ BACK  BY AND VERIFIED WITH: PHARMD Isabel Caprice [id number] @1646  FH    Enterococcus Faecium NOT DETECTED NOT DETECTED Final   Listeria monocytogenes NOT DETECTED NOT DETECTED Final   Staphylococcus species DETECTED (A) NOT DETECTED Final    Comment: CRITICAL RESULT CALLED TO, READ BACK BY AND VERIFIED WITH: PHARMD Dollene Cleveland @1646  FH    Staphylococcus aureus (BCID) NOT DETECTED NOT DETECTED Final   Staphylococcus epidermidis DETECTED (A) NOT DETECTED Final    Comment: Methicillin (oxacillin) resistant coagulase negative staphylococcus. Possible blood culture contaminant (unless isolated from more than one blood culture draw or clinical case suggests pathogenicity). No antibiotic treatment is indicated for blood  culture contaminants. CRITICAL RESULT CALLED TO, READ BACK BY AND VERIFIED WITH: PHARMD Dollene Cleveland @1646  FH    Staphylococcus lugdunensis DETECTED (A) NOT DETECTED Final    Comment: Methicillin (oxacillin) resistant coagulase negative staphylococcus. Possible blood culture contaminant (unless isolated from more than one blood culture draw or clinical case suggests pathogenicity). No antibiotic treatment is indicated for blood  culture contaminants. CRITICAL RESULT CALLED TO, READ BACK BY AND VERIFIED WITH: PHARMD Dollene Cleveland @1646  FH    Streptococcus species NOT  DETECTED NOT DETECTED Final   Streptococcus agalactiae NOT DETECTED NOT DETECTED Final   Streptococcus pneumoniae NOT DETECTED NOT DETECTED Final   Streptococcus pyogenes NOT DETECTED NOT DETECTED Final   A.calcoaceticus-baumannii NOT DETECTED NOT DETECTED Final   Bacteroides fragilis NOT DETECTED NOT DETECTED Final   Enterobacterales NOT DETECTED NOT DETECTED Final   Enterobacter cloacae complex NOT DETECTED NOT DETECTED Final   Escherichia coli NOT DETECTED NOT DETECTED Final   Klebsiella aerogenes NOT DETECTED NOT DETECTED Final   Klebsiella oxytoca NOT DETECTED NOT DETECTED Final   Klebsiella pneumoniae NOT DETECTED NOT DETECTED Final   Proteus species NOT DETECTED NOT DETECTED Final   Salmonella species NOT DETECTED NOT DETECTED Final   Serratia marcescens NOT DETECTED NOT DETECTED Final   Haemophilus influenzae NOT DETECTED NOT DETECTED Final   Neisseria meningitidis NOT DETECTED NOT DETECTED Final   Pseudomonas aeruginosa NOT DETECTED NOT DETECTED Final   Stenotrophomonas maltophilia NOT DETECTED NOT DETECTED Final   Candida albicans NOT DETECTED NOT DETECTED Final   Candida auris NOT DETECTED NOT DETECTED Final   Candida glabrata NOT DETECTED NOT DETECTED Final   Candida krusei NOT DETECTED NOT DETECTED Final   Candida parapsilosis NOT DETECTED NOT DETECTED Final   Candida tropicalis NOT DETECTED NOT DETECTED Final   Cryptococcus neoformans/gattii NOT DETECTED NOT DETECTED Final   Methicillin resistance mecA/C DETECTED (A) NOT DETECTED Final    Comment: CRITICAL RESULT CALLED TO, READ BACK BY AND VERIFIED WITH: PHARMD Dollene Cleveland @1646  FH    Vancomycin resistance NOT DETECTED NOT DETECTED Final    Comment: Performed at [facility], [address], [place], [place] [address]  Culture, blood (Routine X 2) w Reflex to ID Panel     Status: None (Preliminary result)   Collection Time: 05/26/23  8:07 PM   Specimen: BLOOD LEFT ARM  Result Value Ref Range Status    Specimen Description BLOOD LEFT ARM  Final   Special Requests   Final    BOTTLES DRAWN AEROBIC ONLY Blood Culture results may not be optimal due to an inadequate volume of blood received in culture bottles   Culture   Final    NO GROWTH 2 DAYS Performed at [facility], [address] [place], [place] [address]    Report Status  PENDING  Incomplete  C Difficile Quick Screen w PCR reflex     Status: None   Collection Time: 05/27/23  1:39 AM   Specimen: STOOL  Result Value Ref Range Status   C Diff antigen NEGATIVE NEGATIVE Final   C Diff toxin NEGATIVE NEGATIVE Final   C Diff interpretation No C. difficile detected.  Final    Comment: Performed at [facility], [address] [place], [place] [address]  Gastrointestinal Panel by PCR , Stool     Status: None   Collection Time: 05/27/23  1:39 AM   Specimen: STOOL  Result Value Ref Range Status   Campylobacter species NOT DETECTED NOT DETECTED Final   Plesimonas shigelloides NOT DETECTED NOT DETECTED Final   Salmonella species NOT DETECTED NOT DETECTED Final   Yersinia enterocolitica NOT DETECTED NOT DETECTED Final   Vibrio species NOT DETECTED NOT DETECTED Final   Vibrio cholerae NOT DETECTED NOT DETECTED Final   Enteroaggregative E coli (EAEC) NOT DETECTED NOT DETECTED Final   Enteropathogenic E coli (EPEC) NOT DETECTED NOT DETECTED Final   Enterotoxigenic E coli (ETEC) NOT DETECTED NOT DETECTED Final   Shiga like toxin producing E coli (STEC) NOT DETECTED NOT DETECTED Final   Shigella/Enteroinvasive E coli (EIEC) NOT DETECTED NOT DETECTED Final   Cryptosporidium NOT DETECTED NOT DETECTED Final   Cyclospora cayetanensis NOT DETECTED NOT DETECTED Final   Entamoeba histolytica NOT DETECTED NOT DETECTED Final   Giardia lamblia NOT DETECTED NOT DETECTED Final   Adenovirus F40/41 NOT DETECTED NOT DETECTED Final   Astrovirus NOT DETECTED NOT DETECTED Final   Norovirus GI/GII NOT DETECTED NOT DETECTED Final   Rotavirus A NOT  DETECTED NOT DETECTED Final   Sapovirus (I, II, IV, and V) NOT DETECTED NOT DETECTED Final    Comment: Performed at [facility], [address], [place], [place] [address]         Radiology Studies: No results found.         LOS: 1 day   Time spent= 35 mins    Miguel Rota, MD [facility]  If 7PM-7AM, please contact night-coverage  05/28/2023, 11:29 AM

## 2023-05-28 NOTE — Consult Note (Addendum)
 [facility]    Date of Admission:  05/26/2023   Total days of antibiotics 3        Day 1: Rocephin         Day 2: Rocephin         Day 3: D/c rocephin, vancomycin        Reason for Consult: Polymicrobial bacteremia, Enterococcus faecalis, Staphylococcus epidermidis, and Staphylococcus lugdunensis with Methicillin resistance mecA/C    Referring Provider: Stephania Fragmin MD Primary Care Provider: Eloisa Northern, MD    Assessment: Denise Kelly is a 83 year old female who presented with encephalopathy and diarrhea and was found to be bacteremic with Enterococcus faecalis, Staphylococcus epidermidis, and Staphylococcus lugdunensis with Methicillin resistance mecA/C present in 2/4 bottles. Bacteremia is most likely due to bacterial translocation from the GI tract with the history of diarrhea. There were concerns for urinary source; however, the U/A is contaminated, urinary source is not likley at this time.   Plan: Continue Vancomycin,  discontinue Rocephin  Repeat blood cultures Obtain TTE  D/c enteric precautions  Consult wound care for the RLE wound  Principal Problem:   Urinary tract infection   Scheduled Meds:  apixaban  5 mg Oral BID   gabapentin  200 mg Oral TID   potassium chloride  40 mEq Oral Q4H   Continuous Infusions:  cefTRIAXone (ROCEPHIN)  IV 1 g (05/27/23 2309)   vancomycin     PRN Meds:.acetaminophen **OR** acetaminophen, hydrALAZINE, ipratropium-albuterol, metoprolol tartrate, ondansetron **OR** ondansetron (ZOFRAN) IV, oxyCODONE, rOPINIRole  HPI: Denise Kelly is a 83 y.o. female with a past medical history of alcohol usage, anxiety, and  hypertension who presented to the emergency department on 3/11 for confusion. Blood cultures which grew Enterococcus faecalis, Staphylococcus epidermidis, and Staphylococcus lugdunensis with Methicillin resistance mecA/C present. There were initial concerns for infectious  diarrhea, GI panel and  C. Difficile were both negative.   Today, patient reports diarrhea but does not share the duration of the diarrhea. She denies abdominal pain, arthralgias, fevers, chills.     Review of Systems: Review of Systems  Constitutional:  Negative for chills and fever.  Gastrointestinal:  Positive for diarrhea. Negative for abdominal pain.  Musculoskeletal:  Negative for back pain and joint pain.    Past Medical History:  Diagnosis Date   Alcohol abuse    ETOH and xanax   Anxiety    Panic attacks    Arthritis    Cancer (HCC)    skin- basal , squamous , melonoma- right hand   Cellulitis    right leg   Complication of anesthesia 2009   "felt drunk for a week after"- AVM- both times- felt drunk   Depression    Encephalopathy    GERD (gastroesophageal reflux disease)    HOH (hard of hearing)    HOH (hard of hearing)    Hypertension    not on mediacations   Palpitations    PONV (postoperative nausea and vomiting)    Spondylolisthesis of lumbosacral region    UTI (urinary tract infection)    frequently    Social History   Tobacco Use   Smoking status: Former    Current packs/day: 0.00    Average packs/day: 1 pack/day for 20.0 years (20.0 ttl pk-yrs)    Types: Cigarettes    Start date: 03/17/1972    Quit date: 03/17/1992    Years since quitting: 31.2   Smokeless tobacco: Never  Tobacco comments:    quit smoking many years ago .  quit 1994  Vaping Use   Vaping status: Never Used  Substance Use Topics   Alcohol use: No    Comment: unable to get alcohol per family not since September   Drug use: No    Family History  Problem Relation Age of Onset   Hypertension Other    Cancer Mother    Cancer Father    Allergies  Allergen Reactions   Sulfa Antibiotics Hives, Swelling and Other (See Comments)    Facial/eye swelling    Coreg [Carvedilol] Other (See Comments)    Memory loss   Motrin [Ibuprofen] Palpitations   Toprol Xl [Metoprolol Tartrate] Cough   Doxycycline  Hyclate Other (See Comments)    HEARTBURN   Flovent Hfa [Fluticasone] Itching   Statins Other (See Comments)    MENTAL STATUS CHANGE    OBJECTIVE: Blood pressure 135/64, pulse 83, temperature 97.7 F (36.5 C), temperature source Oral, resp. rate 18, height 5\' 7"  (1.702 m), weight 99 kg, SpO2 93%.  Physical Exam Vitals reviewed.  Constitutional:      General: She is not in acute distress.    Appearance: Normal appearance. She is not ill-appearing or toxic-appearing.  Cardiovascular:     Rate and Rhythm: Normal rate and regular rhythm.     Heart sounds: No murmur heard. Pulmonary:     Effort: Pulmonary effort is normal.  Abdominal:     General: Bowel sounds are normal. There is no distension.     Palpations: Abdomen is soft.     Tenderness: There is no abdominal tenderness. There is no guarding.  Musculoskeletal:     Comments: RLE wound present, in clean and dry dressing   Skin:    General: Skin is warm and dry.  Neurological:     Mental Status: She is alert.     Lab Results Lab Results  Component Value Date   WBC 6.6 05/27/2023   HGB 14.9 05/27/2023   HCT 44.1 05/27/2023   MCV 84.5 05/27/2023   PLT 193 05/27/2023    Lab Results  Component Value Date   CREATININE 0.74 05/27/2023   BUN 10 05/27/2023   NA 139 05/27/2023   K 3.0 (L) 05/27/2023   CL 109 05/27/2023   CO2 23 05/27/2023    Lab Results  Component Value Date   ALT 24 05/26/2023   AST 36 05/26/2023   ALKPHOS 81 05/26/2023   BILITOT 1.6 (H) 05/26/2023     Microbiology: Recent Results (from the past 240 hours)  Culture, blood (Routine X 2) w Reflex to ID Panel     Status: None (Preliminary result)   Collection Time: 05/26/23  8:01 PM   Specimen: BLOOD  Result Value Ref Range Status   Specimen Description BLOOD LEFT ANTECUBITAL  Final   Special Requests   Final    BOTTLES DRAWN AEROBIC AND ANAEROBIC Blood Culture adequate volume   Culture  Setup Time   Final    GRAM POSITIVE COCCI IN CHAINS IN  BOTH AEROBIC AND ANAEROBIC BOTTLES CRITICAL RESULT CALLED TO, READ BACK BY AND VERIFIED WITH: Chana Bode [id number] @1646  FH Performed at [facility], [address] [place], [place] [address]    Culture GRAM POSITIVE COCCI IN CHAINS  Final   Report Status PENDING  Incomplete  Blood Culture ID Panel (Reflexed)     Status: Abnormal   Collection Time: 05/26/23  8:01 PM  Result Value Ref  Range Status   Enterococcus faecalis DETECTED (A) NOT DETECTED Final    Comment: CRITICAL RESULT CALLED TO, READ BACK BY AND VERIFIED WITH: PHARMD Dollene Cleveland @1646  FH    Enterococcus Faecium NOT DETECTED NOT DETECTED Final   Listeria monocytogenes NOT DETECTED NOT DETECTED Final   Staphylococcus species DETECTED (A) NOT DETECTED Final    Comment: CRITICAL RESULT CALLED TO, READ BACK BY AND VERIFIED WITH: PHARMD Dollene Cleveland @1646  FH    Staphylococcus aureus (BCID) NOT DETECTED NOT DETECTED Final   Staphylococcus epidermidis DETECTED (A) NOT DETECTED Final    Comment: Methicillin (oxacillin) resistant coagulase negative staphylococcus. Possible blood culture contaminant (unless isolated from more than one blood culture draw or clinical case suggests pathogenicity). No antibiotic treatment is indicated for blood  culture contaminants. CRITICAL RESULT CALLED TO, READ BACK BY AND VERIFIED WITH: PHARMD Dollene Cleveland @1646  FH    Staphylococcus lugdunensis DETECTED (A) NOT DETECTED Final    Comment: Methicillin (oxacillin) resistant coagulase negative staphylococcus. Possible blood culture contaminant (unless isolated from more than one blood culture draw or clinical case suggests pathogenicity). No antibiotic treatment is indicated for blood  culture contaminants. CRITICAL RESULT CALLED TO, READ BACK BY AND VERIFIED WITH: PHARMD Dollene Cleveland @1646  FH    Streptococcus species NOT DETECTED NOT DETECTED Final   Streptococcus agalactiae NOT DETECTED NOT DETECTED Final    Streptococcus pneumoniae NOT DETECTED NOT DETECTED Final   Streptococcus pyogenes NOT DETECTED NOT DETECTED Final   A.calcoaceticus-baumannii NOT DETECTED NOT DETECTED Final   Bacteroides fragilis NOT DETECTED NOT DETECTED Final   Enterobacterales NOT DETECTED NOT DETECTED Final   Enterobacter cloacae complex NOT DETECTED NOT DETECTED Final   Escherichia coli NOT DETECTED NOT DETECTED Final   Klebsiella aerogenes NOT DETECTED NOT DETECTED Final   Klebsiella oxytoca NOT DETECTED NOT DETECTED Final   Klebsiella pneumoniae NOT DETECTED NOT DETECTED Final   Proteus species NOT DETECTED NOT DETECTED Final   Salmonella species NOT DETECTED NOT DETECTED Final   Serratia marcescens NOT DETECTED NOT DETECTED Final   Haemophilus influenzae NOT DETECTED NOT DETECTED Final   Neisseria meningitidis NOT DETECTED NOT DETECTED Final   Pseudomonas aeruginosa NOT DETECTED NOT DETECTED Final   Stenotrophomonas maltophilia NOT DETECTED NOT DETECTED Final   Candida albicans NOT DETECTED NOT DETECTED Final   Candida auris NOT DETECTED NOT DETECTED Final   Candida glabrata NOT DETECTED NOT DETECTED Final   Candida krusei NOT DETECTED NOT DETECTED Final   Candida parapsilosis NOT DETECTED NOT DETECTED Final   Candida tropicalis NOT DETECTED NOT DETECTED Final   Cryptococcus neoformans/gattii NOT DETECTED NOT DETECTED Final   Methicillin resistance mecA/C DETECTED (A) NOT DETECTED Final    Comment: CRITICAL RESULT CALLED TO, READ BACK BY AND VERIFIED WITH: PHARMD Dollene Cleveland @1646  FH    Vancomycin resistance NOT DETECTED NOT DETECTED Final    Comment: Performed at [facility], [address], [place], [place] [address]  Culture, blood (Routine X 2) w Reflex to ID Panel     Status: None (Preliminary result)   Collection Time: 05/26/23  8:07 PM   Specimen: BLOOD LEFT ARM  Result Value Ref Range Status   Specimen Description BLOOD LEFT ARM  Final   Special Requests   Final    BOTTLES DRAWN  AEROBIC ONLY Blood Culture results may not be optimal due to an inadequate volume of blood received in culture bottles   Culture   Final    NO  GROWTH < 12 HOURS Performed at [facility], [address], [place], [place] [address]    Report Status PENDING  Incomplete  C Difficile Quick Screen w PCR reflex     Status: None   Collection Time: 05/27/23  1:39 AM   Specimen: STOOL  Result Value Ref Range Status   C Diff antigen NEGATIVE NEGATIVE Final   C Diff toxin NEGATIVE NEGATIVE Final   C Diff interpretation No C. difficile detected.  Final    Comment: Performed at [facility], [address] [place], [place] [address]  Gastrointestinal Panel by PCR , Stool     Status: None   Collection Time: 05/27/23  1:39 AM   Specimen: STOOL  Result Value Ref Range Status   Campylobacter species NOT DETECTED NOT DETECTED Final   Plesimonas shigelloides NOT DETECTED NOT DETECTED Final   Salmonella species NOT DETECTED NOT DETECTED Final   Yersinia enterocolitica NOT DETECTED NOT DETECTED Final   Vibrio species NOT DETECTED NOT DETECTED Final   Vibrio cholerae NOT DETECTED NOT DETECTED Final   Enteroaggregative E coli (EAEC) NOT DETECTED NOT DETECTED Final   Enteropathogenic E coli (EPEC) NOT DETECTED NOT DETECTED Final   Enterotoxigenic E coli (ETEC) NOT DETECTED NOT DETECTED Final   Shiga like toxin producing E coli (STEC) NOT DETECTED NOT DETECTED Final   Shigella/Enteroinvasive E coli (EIEC) NOT DETECTED NOT DETECTED Final   Cryptosporidium NOT DETECTED NOT DETECTED Final   Cyclospora cayetanensis NOT DETECTED NOT DETECTED Final   Entamoeba histolytica NOT DETECTED NOT DETECTED Final   Giardia lamblia NOT DETECTED NOT DETECTED Final   Adenovirus F40/41 NOT DETECTED NOT DETECTED Final   Astrovirus NOT DETECTED NOT DETECTED Final   Norovirus GI/GII NOT DETECTED NOT DETECTED Final   Rotavirus A NOT DETECTED NOT DETECTED Final   Sapovirus (I, II, IV, and V) NOT DETECTED NOT  DETECTED Final    Comment: Performed at [facility], [address], [place], [place] [address]    Faith Rogue, DO [facility] [phone] pager   [phone] cell 05/28/2023, 9:29 AM

## 2023-05-28 NOTE — Progress Notes (Signed)
  Echocardiogram 2D Echocardiogram has been performed.  Ocie Doyne RDCS 05/28/2023, 12:23 PM

## 2023-05-28 NOTE — Plan of Care (Signed)
 Pt. Refusing to get out of bed throughout shift. Wound care completed by RN. Pain medication given after for BLLE pain.  Problem: Clinical Measurements: Goal: Cardiovascular complication will be avoided Outcome: Progressing   Problem: Nutrition: Goal: Adequate nutrition will be maintained Outcome: Progressing   Problem: Elimination: Goal: Will not experience complications related to bowel motility Outcome: Progressing Goal: Will not experience complications related to urinary retention Outcome: Progressing   Problem: Safety: Goal: Ability to remain free from injury will improve Outcome: Progressing   Problem: Skin Integrity: Goal: Risk for impaired skin integrity will decrease Outcome: Progressing

## 2023-05-28 NOTE — Consult Note (Signed)
 WOC Nurse Consult Note: this patient is familiar to [facility] team d/t venous insufficiency with longstanding venous ulcerations; last seen in person 04/22/2023  Reason for Consult: R lower leg wound  Wound type: full thickness r/t venous insufficiency  Pressure Injury POA: NA  Measurement: see nursing flowsheet  Wound bed: last photo documentation patient with dark flaking eschar; per bedside nurse L leg with just hyperkeratotic skin  Drainage (amount, consistency, odor) see nursing flowsheet  Periwound: erythema and edema; hyperkeratotic skin  Dressing procedure/placement/frequency: Cleanse R leg (intact skin and open areas) with Vashe wound cleanser [employer] [place] [phone]), do not rinse and allow to air dry. Apply Xeroform gauze [employer] [place] [phone]) to wound beds daily, cover with ABD pads and wrap with Kerlix roll gauze beginning just above toes and ending right below knee.   Will order Eucerin cream for hyperkeratotic skin to L leg.    POC discussed with bedside nurse. Appreciate Cleatrice Burke, RN assistance with this consult. WOC team will not follow. Re-consult if further needs arise.   Thank you,    Priscella Mann MSN, RN-BC, [employer] [phone]

## 2023-05-29 DIAGNOSIS — B952 Enterococcus as the cause of diseases classified elsewhere: Secondary | ICD-10-CM | POA: Diagnosis not present

## 2023-05-29 DIAGNOSIS — R197 Diarrhea, unspecified: Secondary | ICD-10-CM | POA: Diagnosis not present

## 2023-05-29 DIAGNOSIS — N3000 Acute cystitis without hematuria: Secondary | ICD-10-CM | POA: Diagnosis not present

## 2023-05-29 DIAGNOSIS — S81801A Unspecified open wound, right lower leg, initial encounter: Secondary | ICD-10-CM | POA: Diagnosis not present

## 2023-05-29 DIAGNOSIS — A4102 Sepsis due to Methicillin resistant Staphylococcus aureus: Secondary | ICD-10-CM | POA: Diagnosis not present

## 2023-05-29 LAB — CBC
HCT: 43 % (ref 36.0–46.0)
Hemoglobin: 14.1 g/dL (ref 12.0–15.0)
MCH: 28.3 pg (ref 26.0–34.0)
MCHC: 32.8 g/dL (ref 30.0–36.0)
MCV: 86.2 fL (ref 80.0–100.0)
Platelets: 170 10*3/uL (ref 150–400)
RBC: 4.99 MIL/uL (ref 3.87–5.11)
RDW: 14 % (ref 11.5–15.5)
WBC: 5.7 10*3/uL (ref 4.0–10.5)
nRBC: 0 % (ref 0.0–0.2)

## 2023-05-29 LAB — BASIC METABOLIC PANEL
Anion gap: 9 (ref 5–15)
BUN: 7 mg/dL — ABNORMAL LOW (ref 8–23)
CO2: 27 mmol/L (ref 22–32)
Calcium: 8.3 mg/dL — ABNORMAL LOW (ref 8.9–10.3)
Chloride: 105 mmol/L (ref 98–111)
Creatinine, Ser: 0.61 mg/dL (ref 0.44–1.00)
GFR, Estimated: >60 mL/min (ref >60)
Glucose, Bld: 116 mg/dL — ABNORMAL HIGH (ref 70–99)
Potassium: 3.6 mmol/L (ref 3.5–5.1)
Sodium: 141 mmol/L (ref 135–145)

## 2023-05-29 LAB — MAGNESIUM: Magnesium: 1.7 mg/dL (ref 1.7–2.4)

## 2023-05-29 MED ORDER — DIPHENHYDRAMINE HCL 25 MG PO CAPS
25.0000 mg | ORAL_CAPSULE | Freq: Once | ORAL | Status: AC | PRN
  Administered 2023-05-30: 25 mg via ORAL
  Filled 2023-05-29: qty 1

## 2023-05-29 MED ORDER — POTASSIUM CHLORIDE CRYS ER 20 MEQ PO TBCR
40.0000 meq | EXTENDED_RELEASE_TABLET | Freq: Once | ORAL | Status: AC
Start: 2023-05-29 — End: 2023-05-29
  Administered 2023-05-29: 40 meq via ORAL
  Filled 2023-05-29: qty 2

## 2023-05-29 MED ORDER — MAGNESIUM OXIDE -MG SUPPLEMENT 400 (240 MG) MG PO TABS
800.0000 mg | ORAL_TABLET | Freq: Once | ORAL | Status: AC
Start: 2023-05-29 — End: 2023-05-29
  Administered 2023-05-29: 800 mg via ORAL
  Filled 2023-05-29: qty 2

## 2023-05-29 NOTE — Progress Notes (Signed)
 [facility]  Date of Admission:  05/26/2023   Total days of antibiotics 4          ASSESSMENT Denise Kelly is a 83 year old female who presented with encephalopathy and diarrhea and was found to be bacteremic with Enterococcus faecalis, Staphylococcus epidermidis, and Staphylococcus lugdunensis with Methicillin resistance mecA/C present in 2/4 bottles. Bacteremia is most likely due to bacterial translocation from the GI tract with the history of diarrhea. TTE did not reveal vegetations, will need to obtain TEE.  PLAN: Continue Vancomycin, sensitives are not finalized yet.  Repeat blood cultures are NGTD Obtain TEE    Principal Problem:   Urinary tract infection   Scheduled Meds:  apixaban  5 mg Oral BID   gabapentin  200 mg Oral TID   hydrocerin   Topical BID   magnesium oxide  800 mg Oral Once   potassium chloride  40 mEq Oral Once   Continuous Infusions:  vancomycin 1,250 mg (05/28/23 1731)   PRN Meds:.acetaminophen **OR** acetaminophen, diphenhydrAMINE, hydrALAZINE, ipratropium-albuterol, metoprolol tartrate, ondansetron **OR** ondansetron (ZOFRAN) IV, oxyCODONE, rOPINIRole   SUBJECTIVE: Today, she reports feeling ok. We discussed the results of the TTE and that she will need to have a TEE done. She prefers if Dr. Anne Fu can do the TEE, and is feeling anxious about it.   Review of Systems: Denies fevers and chills   Allergies  Allergen Reactions   Sulfa Antibiotics Hives, Swelling and Other (See Comments)    Facial/eye swelling    Coreg [Carvedilol] Other (See Comments)    Memory loss   Motrin [Ibuprofen] Palpitations   Toprol Xl [Metoprolol Tartrate] Cough   Doxycycline Hyclate Other (See Comments)    HEARTBURN   Flovent Hfa [Fluticasone] Itching   Statins Other (See Comments)    MENTAL STATUS CHANGE    OBJECTIVE: Vitals:   05/28/23 1727 05/28/23 2000 05/29/23 0647 05/29/23 0800  BP: (!) 144/72 121/69 133/69 126/68   Pulse: 95 88 88 82  Resp: 18 18 18 16   Temp: 98.1 F (36.7 C) 98.1 F (36.7 C) 97.8 F (36.6 C) (!) 97.4 F (36.3 C)  TempSrc:  Oral Oral Oral  SpO2: 97% 96%  94%  Weight:      Height:       Body mass index is 34.18 kg/m.  Physical Exam Vitals reviewed.  Constitutional:      Appearance: Normal appearance.  Cardiovascular:     Rate and Rhythm: Normal rate and regular rhythm.     Heart sounds: Normal heart sounds. No murmur heard. Pulmonary:     Effort: Pulmonary effort is normal.  Skin:    General: Skin is warm and dry.  Neurological:     Mental Status: She is alert.  Psychiatric:        Mood and Affect: Mood normal.     Lab Results Lab Results  Component Value Date   WBC 5.7 05/29/2023   HGB 14.1 05/29/2023   HCT 43.0 05/29/2023   MCV 86.2 05/29/2023   PLT 170 05/29/2023    Lab Results  Component Value Date   CREATININE 0.61 05/29/2023   BUN 7 (L) 05/29/2023   NA 141 05/29/2023   K 3.6 05/29/2023   CL 105 05/29/2023   CO2 27 05/29/2023    Lab Results  Component Value Date   ALT 24 05/26/2023   AST 36 05/26/2023   ALKPHOS 81 05/26/2023   BILITOT 1.6 (  H) 05/26/2023     Microbiology: Recent Results (from the past 240 hours)  Culture, blood (Routine X 2) w Reflex to ID Panel     Status: Abnormal (Preliminary result)   Collection Time: 05/26/23  8:01 PM   Specimen: BLOOD  Result Value Ref Range Status   Specimen Description BLOOD LEFT ANTECUBITAL  Final   Special Requests   Final    BOTTLES DRAWN AEROBIC AND ANAEROBIC Blood Culture adequate volume   Culture  Setup Time   Final    GRAM POSITIVE COCCI IN CHAINS IN BOTH AEROBIC AND ANAEROBIC BOTTLES CRITICAL RESULT CALLED TO, READ BACK BY AND VERIFIED WITH: PHARMD Dollene Cleveland @1646  FH    Culture (A)  Final    ENTEROCOCCUS FAECALIS CULTURE REINCUBATED FOR BETTER GROWTH ATTEMPTING TO ISOLATE FOR WORKUP Performed at [facility], [address], [place], [place] [address]    Report  Status PENDING  Incomplete  Blood Culture ID Panel (Reflexed)     Status: Abnormal   Collection Time: 05/26/23  8:01 PM  Result Value Ref Range Status   Enterococcus faecalis DETECTED (A) NOT DETECTED Final    Comment: CRITICAL RESULT CALLED TO, READ BACK BY AND VERIFIED WITH: PHARMD Dollene Cleveland @1646  FH    Enterococcus Faecium NOT DETECTED NOT DETECTED Final   Listeria monocytogenes NOT DETECTED NOT DETECTED Final   Staphylococcus species DETECTED (A) NOT DETECTED Final    Comment: CRITICAL RESULT CALLED TO, READ BACK BY AND VERIFIED WITH: PHARMD Dollene Cleveland @1646  FH    Staphylococcus aureus (BCID) NOT DETECTED NOT DETECTED Final   Staphylococcus epidermidis DETECTED (A) NOT DETECTED Final    Comment: Methicillin (oxacillin) resistant coagulase negative staphylococcus. Possible blood culture contaminant (unless isolated from more than one blood culture draw or clinical case suggests pathogenicity). No antibiotic treatment is indicated for blood  culture contaminants. CRITICAL RESULT CALLED TO, READ BACK BY AND VERIFIED WITH: PHARMD Dollene Cleveland @1646  FH    Staphylococcus lugdunensis DETECTED (A) NOT DETECTED Final    Comment: Methicillin (oxacillin) resistant coagulase negative staphylococcus. Possible blood culture contaminant (unless isolated from more than one blood culture draw or clinical case suggests pathogenicity). No antibiotic treatment is indicated for blood  culture contaminants. CRITICAL RESULT CALLED TO, READ BACK BY AND VERIFIED WITH: PHARMD Dollene Cleveland @1646  FH    Streptococcus species NOT DETECTED NOT DETECTED Final   Streptococcus agalactiae NOT DETECTED NOT DETECTED Final   Streptococcus pneumoniae NOT DETECTED NOT DETECTED Final   Streptococcus pyogenes NOT DETECTED NOT DETECTED Final   A.calcoaceticus-baumannii NOT DETECTED NOT DETECTED Final   Bacteroides fragilis NOT DETECTED NOT DETECTED Final   Enterobacterales NOT DETECTED NOT  DETECTED Final   Enterobacter cloacae complex NOT DETECTED NOT DETECTED Final   Escherichia coli NOT DETECTED NOT DETECTED Final   Klebsiella aerogenes NOT DETECTED NOT DETECTED Final   Klebsiella oxytoca NOT DETECTED NOT DETECTED Final   Klebsiella pneumoniae NOT DETECTED NOT DETECTED Final   Proteus species NOT DETECTED NOT DETECTED Final   Salmonella species NOT DETECTED NOT DETECTED Final   Serratia marcescens NOT DETECTED NOT DETECTED Final   Haemophilus influenzae NOT DETECTED NOT DETECTED Final   Neisseria meningitidis NOT DETECTED NOT DETECTED Final   Pseudomonas aeruginosa NOT DETECTED NOT DETECTED Final   Stenotrophomonas maltophilia NOT DETECTED NOT DETECTED Final   Candida albicans NOT DETECTED NOT DETECTED Final   Candida auris NOT DETECTED NOT DETECTED Final   Candida glabrata NOT DETECTED NOT  DETECTED Final   Candida krusei NOT DETECTED NOT DETECTED Final   Candida parapsilosis NOT DETECTED NOT DETECTED Final   Candida tropicalis NOT DETECTED NOT DETECTED Final   Cryptococcus neoformans/gattii NOT DETECTED NOT DETECTED Final   Methicillin resistance mecA/C DETECTED (A) NOT DETECTED Final    Comment: CRITICAL RESULT CALLED TO, READ BACK BY AND VERIFIED WITH: PHARMD Dollene Cleveland @1646  FH    Vancomycin resistance NOT DETECTED NOT DETECTED Final    Comment: Performed at [facility], [address], [place], [place] [address]  Culture, blood (Routine X 2) w Reflex to ID Panel     Status: None (Preliminary result)   Collection Time: 05/26/23  8:07 PM   Specimen: BLOOD LEFT ARM  Result Value Ref Range Status   Specimen Description BLOOD LEFT ARM  Final   Special Requests   Final    BOTTLES DRAWN AEROBIC ONLY Blood Culture results may not be optimal due to an inadequate volume of blood received in culture bottles   Culture   Final    NO GROWTH 3 DAYS Performed at [facility], [address] [place], [place] [address]    Report Status PENDING   Incomplete  C Difficile Quick Screen w PCR reflex     Status: None   Collection Time: 05/27/23  1:39 AM   Specimen: STOOL  Result Value Ref Range Status   C Diff antigen NEGATIVE NEGATIVE Final   C Diff toxin NEGATIVE NEGATIVE Final   C Diff interpretation No C. difficile detected.  Final    Comment: Performed at [facility], [address], [place], [place] [address]  Gastrointestinal Panel by PCR , Stool     Status: None   Collection Time: 05/27/23  1:39 AM   Specimen: STOOL  Result Value Ref Range Status   Campylobacter species NOT DETECTED NOT DETECTED Final   Plesimonas shigelloides NOT DETECTED NOT DETECTED Final   Salmonella species NOT DETECTED NOT DETECTED Final   Yersinia enterocolitica NOT DETECTED NOT DETECTED Final   Vibrio species NOT DETECTED NOT DETECTED Final   Vibrio cholerae NOT DETECTED NOT DETECTED Final   Enteroaggregative E coli (EAEC) NOT DETECTED NOT DETECTED Final   Enteropathogenic E coli (EPEC) NOT DETECTED NOT DETECTED Final   Enterotoxigenic E coli (ETEC) NOT DETECTED NOT DETECTED Final   Shiga like toxin producing E coli (STEC) NOT DETECTED NOT DETECTED Final   Shigella/Enteroinvasive E coli (EIEC) NOT DETECTED NOT DETECTED Final   Cryptosporidium NOT DETECTED NOT DETECTED Final   Cyclospora cayetanensis NOT DETECTED NOT DETECTED Final   Entamoeba histolytica NOT DETECTED NOT DETECTED Final   Giardia lamblia NOT DETECTED NOT DETECTED Final   Adenovirus F40/41 NOT DETECTED NOT DETECTED Final   Astrovirus NOT DETECTED NOT DETECTED Final   Norovirus GI/GII NOT DETECTED NOT DETECTED Final   Rotavirus A NOT DETECTED NOT DETECTED Final   Sapovirus (I, II, IV, and V) NOT DETECTED NOT DETECTED Final    Comment: Performed at [facility], [address] [place], [place] [address]  Culture, blood (Routine X 2) w Reflex to ID Panel     Status: None (Preliminary result)   Collection Time: 05/28/23  8:41 AM   Specimen: BLOOD  Result Value Ref  Range Status   Specimen Description BLOOD SITE NOT SPECIFIED  Final   Special Requests   Final    BOTTLES DRAWN AEROBIC AND ANAEROBIC Blood Culture results may not be optimal due to  an inadequate volume of blood received in culture bottles   Culture   Final    NO GROWTH < 24 HOURS Performed at [facility], [address] [place], [place] [address]    Report Status PENDING  Incomplete  Culture, blood (Routine X 2) w Reflex to ID Panel     Status: None (Preliminary result)   Collection Time: 05/28/23  8:41 AM   Specimen: BLOOD  Result Value Ref Range Status   Specimen Description BLOOD SITE NOT SPECIFIED  Final   Special Requests   Final    BOTTLES DRAWN AEROBIC AND ANAEROBIC Blood Culture results may not be optimal due to an inadequate volume of blood received in culture bottles   Culture   Final    NO GROWTH < 24 HOURS Performed at [facility], [address] [place], [place] [address]    Report Status PENDING  Incomplete    Faith Rogue, [facility] [phone] pager   [phone] cell 05/29/2023, 11:54 AM

## 2023-05-29 NOTE — Plan of Care (Signed)

## 2023-05-29 NOTE — Progress Notes (Signed)
 PROGRESS NOTE    Denise Kelly  YQM:[id number] DOB: 1941/01/31 DOA: 05/26/2023 PCP: Eloisa Northern, MD    Brief Narrative:  83 year old with history of alcohol use, anxiety, HTN comes to the ED with confusion.  Reports of weeping wound in the right lower extremity found at her living facility sitting in feces and urine.  In the ER noted to have concerns of urinary tract infection, right lower lobe consolidation.  She also reported diarrhea, reportedly has had norovirus going around in her facility.  Eventually cultures grew multiple species with some concerns of possible contaminated therefore repeat surveillance cultures ordered.  Infectious disease also consulted for their input.  In terms of her diarrhea GI panel and C. difficile studies were negative.   Assessment & Plan:  Principal Problem:   Urinary tract infection   Acute metabolic encephalopathy Multi organism bacteremia - Multifactorial.  Cultures have been reviewed.  ID consulted.  Currently on IV vancomycin, echocardiogram shows preserved EF.  TEE?  Diarrhea, resolved - Norovirus cases reported at her facility.  GI panel and C. difficile is negative here.  Hypokalemia/hypocalcemia - As needed repletion    Chronic diastolic CHF, 65% - Overall appears to be compensated.  Prior echocardiogram showing EF of 65%  Chronic venous stasis lower extremity bilaterally - Recently treated 7 days of IV Rocephin about 5 weeks ago  GERD - PPI  PE, per history -Continue outpatient Eliquis   DVT prophylaxis:Eliquis    Code Status: Full Code Family Communication: Does not want me to call any family Status is: In.  Continue hospital stay for diarrhea evaluation and IV antibiotics    Subjective: Feeling okay no complaints at this time Tells me she really does not want to do TEE but she will discuss with ID  Examination:  General exam: Appears calm and comfortable  Respiratory system: Clear to auscultation. Respiratory effort  normal. Cardiovascular system: S1 & S2 heard, RRR. No JVD, murmurs, rubs, gallops or clicks. No pedal edema. Gastrointestinal system: Abdomen is nondistended, soft and nontender. No organomegaly or masses felt. Normal bowel sounds heard. Central nervous system: Alert and oriented. No focal neurological deficits. Extremities: Symmetric 5 x 5 power. Skin: No rashes, lesions or ulcers Psychiatry: Judgement and insight appear normal. Mood & affect appropriate.                Diet Orders (From admission, onward)     Start     Ordered   05/27/23 0138  Diet regular Room service appropriate? Yes; Fluid consistency: Thin  Diet effective now       Question Answer Comment  Room service appropriate? Yes   Fluid consistency: Thin      05/27/23 0138            Objective: Vitals:   05/28/23 1727 05/28/23 2000 05/29/23 0647 05/29/23 0800  BP: (!) 144/72 121/69 133/69 126/68  Pulse: 95 88 88 82  Resp: 18 18 18 16   Temp: 98.1 F (36.7 C) 98.1 F (36.7 C) 97.8 F (36.6 C) (!) 97.4 F (36.3 C)  TempSrc:  Oral Oral Oral  SpO2: 97% 96%  94%  Weight:      Height:        Intake/Output Summary (Last 24 hours) at 05/29/2023 1148 Last data filed at 05/29/2023 0351 Gross per 24 hour  Intake 1210 ml  Output 1750 ml  Net -540 ml   Filed Weights   05/27/23 0115  Weight: 99 kg    Scheduled Meds:  apixaban  5 mg Oral BID   gabapentin  200 mg Oral TID   hydrocerin   Topical BID   Continuous Infusions:  vancomycin 1,250 mg (05/28/23 1731)    Nutritional status     Body mass index is 34.18 kg/m.  Data Reviewed:   CBC: Recent Labs  Lab 05/26/23 2001 05/26/23 2006 05/27/23 0523 05/28/23 0841 05/29/23 0740  WBC 7.2  --  6.6 5.9 5.7  NEUTROABS 4.9  --   --   --   --   HGB 15.7* 15.3* 14.9 15.1* 14.1  HCT 46.3* 45.0 44.1 45.8 43.0  MCV 84.2  --  84.5 85.3 86.2  PLT 221  --  193 162 170   Basic Metabolic Panel: Recent Labs  Lab 05/26/23 2001 05/26/23 2006  05/27/23 0523 05/28/23 0841 05/29/23 0740  NA 140 141 139 139 141  K 3.5 3.6 3.0* 3.1* 3.6  CL 102 104 109 106 105  CO2 23  --  23 22 27   GLUCOSE 134* 130* 97 86 116*  BUN 11 14 10  7* 7*  CREATININE 0.78 0.70 0.74 0.48 0.61  CALCIUM 8.9  --  8.5* 8.5* 8.3*  MG 2.0  --   --  1.7 1.7  PHOS  --   --   --  3.2  --    GFR: Estimated Creatinine Clearance: 66.7 mL/min (by C-G formula based on SCr of 0.61 mg/dL). Liver Function Tests: Recent Labs  Lab 05/26/23 2001  AST 36  ALT 24  ALKPHOS 81  BILITOT 1.6*  PROT 7.1  ALBUMIN 3.5   No results for input(s): "LIPASE", "AMYLASE" in the last 168 hours. No results for input(s): "AMMONIA" in the last 168 hours. Coagulation Profile: No results for input(s): "INR", "PROTIME" in the last 168 hours. Cardiac Enzymes: No results for input(s): "CKTOTAL", "CKMB", "CKMBINDEX", "TROPONINI" in the last 168 hours. BNP (last 3 results) No results for input(s): "PROBNP" in the last 8760 hours. HbA1C: No results for input(s): "HGBA1C" in the last 72 hours. CBG: No results for input(s): "GLUCAP" in the last 168 hours. Lipid Profile: No results for input(s): "CHOL", "HDL", "LDLCALC", "TRIG", "CHOLHDL", "LDLDIRECT" in the last 72 hours. Thyroid Function Tests: No results for input(s): "TSH", "T4TOTAL", "FREET4", "T3FREE", "THYROIDAB" in the last 72 hours. Anemia Panel: No results for input(s): "VITAMINB12", "FOLATE", "FERRITIN", "TIBC", "IRON", "RETICCTPCT" in the last 72 hours. Sepsis Labs: No results for input(s): "PROCALCITON", "LATICACIDVEN" in the last 168 hours.  Recent Results (from the past 240 hours)  Culture, blood (Routine X 2) w Reflex to ID Panel     Status: Abnormal (Preliminary result)   Collection Time: 05/26/23  8:01 PM   Specimen: BLOOD  Result Value Ref Range Status   Specimen Description BLOOD LEFT ANTECUBITAL  Final   Special Requests   Final    BOTTLES DRAWN AEROBIC AND ANAEROBIC Blood Culture adequate volume   Culture   Setup Time   Final    GRAM POSITIVE COCCI IN CHAINS IN BOTH AEROBIC AND ANAEROBIC BOTTLES CRITICAL RESULT CALLED TO, READ BACK BY AND VERIFIED WITH: PHARMD Isabel Caprice [id number] @1646  FH    Culture (A)  Final    ENTEROCOCCUS FAECALIS CULTURE REINCUBATED FOR BETTER GROWTH ATTEMPTING TO ISOLATE FOR WORKUP Performed at [facility], [address], [place], [place] [address]    Report Status PENDING  Incomplete  Blood Culture ID Panel (Reflexed)     Status: Abnormal   Collection Time: 05/26/23  8:01 PM  Result Value Ref Range Status   Enterococcus faecalis DETECTED (A) NOT DETECTED Final    Comment: CRITICAL RESULT CALLED TO, READ BACK BY AND VERIFIED WITH: PHARMD Dollene Cleveland @1646  FH    Enterococcus Faecium NOT DETECTED NOT DETECTED Final   Listeria monocytogenes NOT DETECTED NOT DETECTED Final   Staphylococcus species DETECTED (A) NOT DETECTED Final    Comment: CRITICAL RESULT CALLED TO, READ BACK BY AND VERIFIED WITH: PHARMD Dollene Cleveland @1646  FH    Staphylococcus aureus (BCID) NOT DETECTED NOT DETECTED Final   Staphylococcus epidermidis DETECTED (A) NOT DETECTED Final    Comment: Methicillin (oxacillin) resistant coagulase negative staphylococcus. Possible blood culture contaminant (unless isolated from more than one blood culture draw or clinical case suggests pathogenicity). No antibiotic treatment is indicated for blood  culture contaminants. CRITICAL RESULT CALLED TO, READ BACK BY AND VERIFIED WITH: PHARMD Dollene Cleveland @1646  FH    Staphylococcus lugdunensis DETECTED (A) NOT DETECTED Final    Comment: Methicillin (oxacillin) resistant coagulase negative staphylococcus. Possible blood culture contaminant (unless isolated from more than one blood culture draw or clinical case suggests pathogenicity). No antibiotic treatment is indicated for blood  culture contaminants. CRITICAL RESULT CALLED TO, READ BACK BY AND VERIFIED WITH: PHARMD Dollene Cleveland  @1646  FH    Streptococcus species NOT DETECTED NOT DETECTED Final   Streptococcus agalactiae NOT DETECTED NOT DETECTED Final   Streptococcus pneumoniae NOT DETECTED NOT DETECTED Final   Streptococcus pyogenes NOT DETECTED NOT DETECTED Final   A.calcoaceticus-baumannii NOT DETECTED NOT DETECTED Final   Bacteroides fragilis NOT DETECTED NOT DETECTED Final   Enterobacterales NOT DETECTED NOT DETECTED Final   Enterobacter cloacae complex NOT DETECTED NOT DETECTED Final   Escherichia coli NOT DETECTED NOT DETECTED Final   Klebsiella aerogenes NOT DETECTED NOT DETECTED Final   Klebsiella oxytoca NOT DETECTED NOT DETECTED Final   Klebsiella pneumoniae NOT DETECTED NOT DETECTED Final   Proteus species NOT DETECTED NOT DETECTED Final   Salmonella species NOT DETECTED NOT DETECTED Final   Serratia marcescens NOT DETECTED NOT DETECTED Final   Haemophilus influenzae NOT DETECTED NOT DETECTED Final   Neisseria meningitidis NOT DETECTED NOT DETECTED Final   Pseudomonas aeruginosa NOT DETECTED NOT DETECTED Final   Stenotrophomonas maltophilia NOT DETECTED NOT DETECTED Final   Candida albicans NOT DETECTED NOT DETECTED Final   Candida auris NOT DETECTED NOT DETECTED Final   Candida glabrata NOT DETECTED NOT DETECTED Final   Candida krusei NOT DETECTED NOT DETECTED Final   Candida parapsilosis NOT DETECTED NOT DETECTED Final   Candida tropicalis NOT DETECTED NOT DETECTED Final   Cryptococcus neoformans/gattii NOT DETECTED NOT DETECTED Final   Methicillin resistance mecA/C DETECTED (A) NOT DETECTED Final    Comment: CRITICAL RESULT CALLED TO, READ BACK BY AND VERIFIED WITH: PHARMD Dollene Cleveland @1646  FH    Vancomycin resistance NOT DETECTED NOT DETECTED Final    Comment: Performed at [facility], [address], [place], [place] [address]  Culture, blood (Routine X 2) w Reflex to ID Panel     Status: None (Preliminary result)   Collection Time: 05/26/23  8:07 PM   Specimen: BLOOD LEFT  ARM  Result Value Ref Range Status   Specimen Description BLOOD LEFT ARM  Final   Special Requests   Final    BOTTLES DRAWN AEROBIC ONLY Blood Culture results may not be optimal due to an inadequate volume of blood received in culture bottles   Culture   Final  NO GROWTH 3 DAYS Performed at [facility], [address], [place], [place] [address]    Report Status PENDING  Incomplete  C Difficile Quick Screen w PCR reflex     Status: None   Collection Time: 05/27/23  1:39 AM   Specimen: STOOL  Result Value Ref Range Status   C Diff antigen NEGATIVE NEGATIVE Final   C Diff toxin NEGATIVE NEGATIVE Final   C Diff interpretation No C. difficile detected.  Final    Comment: Performed at [facility], [address], [place], [place] [address]  Gastrointestinal Panel by PCR , Stool     Status: None   Collection Time: 05/27/23  1:39 AM   Specimen: STOOL  Result Value Ref Range Status   Campylobacter species NOT DETECTED NOT DETECTED Final   Plesimonas shigelloides NOT DETECTED NOT DETECTED Final   Salmonella species NOT DETECTED NOT DETECTED Final   Yersinia enterocolitica NOT DETECTED NOT DETECTED Final   Vibrio species NOT DETECTED NOT DETECTED Final   Vibrio cholerae NOT DETECTED NOT DETECTED Final   Enteroaggregative E coli (EAEC) NOT DETECTED NOT DETECTED Final   Enteropathogenic E coli (EPEC) NOT DETECTED NOT DETECTED Final   Enterotoxigenic E coli (ETEC) NOT DETECTED NOT DETECTED Final   Shiga like toxin producing E coli (STEC) NOT DETECTED NOT DETECTED Final   Shigella/Enteroinvasive E coli (EIEC) NOT DETECTED NOT DETECTED Final   Cryptosporidium NOT DETECTED NOT DETECTED Final   Cyclospora cayetanensis NOT DETECTED NOT DETECTED Final   Entamoeba histolytica NOT DETECTED NOT DETECTED Final   Giardia lamblia NOT DETECTED NOT DETECTED Final   Adenovirus F40/41 NOT DETECTED NOT DETECTED Final   Astrovirus NOT DETECTED NOT DETECTED Final   Norovirus GI/GII NOT  DETECTED NOT DETECTED Final   Rotavirus A NOT DETECTED NOT DETECTED Final   Sapovirus (I, II, IV, and V) NOT DETECTED NOT DETECTED Final    Comment: Performed at [facility], [address] [place], [place] [address]  Culture, blood (Routine X 2) w Reflex to ID Panel     Status: None (Preliminary result)   Collection Time: 05/28/23  8:41 AM   Specimen: BLOOD  Result Value Ref Range Status   Specimen Description BLOOD SITE NOT SPECIFIED  Final   Special Requests   Final    BOTTLES DRAWN AEROBIC AND ANAEROBIC Blood Culture results may not be optimal due to an inadequate volume of blood received in culture bottles   Culture   Final    NO GROWTH < 24 HOURS Performed at [facility], [address], [place], [place] [address]    Report Status PENDING  Incomplete  Culture, blood (Routine X 2) w Reflex to ID Panel     Status: None (Preliminary result)   Collection Time: 05/28/23  8:41 AM   Specimen: BLOOD  Result Value Ref Range Status   Specimen Description BLOOD SITE NOT SPECIFIED  Final   Special Requests   Final    BOTTLES DRAWN AEROBIC AND ANAEROBIC Blood Culture results may not be optimal due to an inadequate volume of blood received in culture bottles   Culture   Final    NO GROWTH < 24 HOURS Performed at [facility], [address], [place], [place] [address]    Report Status PENDING  Incomplete         Radiology Studies: ECHOCARDIOGRAM COMPLETE Result Date: 05/28/2023    ECHOCARDIOGRAM REPORT   Patient Name:  Loma Sender Date of Exam: 05/28/2023 Medical Rec #:  [id number]       Height:       67.0 in Accession #:    [id number]      Weight:       218.3 lb Date of Birth:  04/20/1940       BSA:          2.099 m Patient Age:    82 years        BP:           135/64 mmHg Patient Gender: F               HR:           86 bpm. Exam Location:  Inpatient Procedure: 2D Echo, Cardiac Doppler and Color Doppler (Both Spectral and Color            Flow Doppler were  utilized during procedure). Indications:    Endocarditis, bacteremia  History:        Patient has prior history of Echocardiogram examinations, most                 recent 04/22/2023. CHF, Signs/Symptoms:Altered Mental Status; Risk                 Factors:Hypertension.  Sonographer:    Vern Claude Referring Phys: [id number] Doreen Salvage Everley Evora IMPRESSIONS  1. Left ventricular ejection fraction, by estimation, is 65 to 70%. The left ventricle has normal function. Left ventricular endocardial border not optimally defined to evaluate regional wall motion. Left ventricular diastolic parameters are consistent with Grade I diastolic dysfunction (impaired relaxation).  2. Right ventricular systolic function is normal. The right ventricular size is normal.  3. The mitral valve is normal in structure. No evidence of mitral valve regurgitation. No evidence of mitral stenosis.  4. The aortic valve was not well visualized. Aortic valve regurgitation is not visualized. No aortic stenosis is present.  5. The inferior vena cava is normal in size with greater than 50% respiratory variability, suggesting right atrial pressure of 3 mmHg. Conclusion(s)/Recommendation(s): Study images insufficient to rul eout valvular vegetations on this transthoracic echocardiogram. Consider a transesophageal echocardiogram to exclude infective endocarditis if clinically indicated. FINDINGS  Left Ventricle: Left ventricular ejection fraction, by estimation, is 65 to 70%. The left ventricle has normal function. Left ventricular endocardial border not optimally defined to evaluate regional wall motion. The left ventricular internal cavity size was normal in size. There is no left ventricular hypertrophy. Left ventricular diastolic parameters are consistent with Grade I diastolic dysfunction (impaired relaxation). Normal left ventricular filling pressure. Right Ventricle: The right ventricular size is normal. No increase in right ventricular wall thickness. Right  ventricular systolic function is normal. Left Atrium: Left atrial size was normal in size. Right Atrium: Right atrial size was normal in size. Pericardium: There is no evidence of pericardial effusion. Mitral Valve: The mitral valve is normal in structure. Mild mitral annular calcification. No evidence of mitral valve regurgitation. No evidence of mitral valve stenosis. MV peak gradient, 2.6 mmHg. The mean mitral valve gradient is 1.0 mmHg. Tricuspid Valve: The tricuspid valve is normal in structure. Tricuspid valve regurgitation is not demonstrated. No evidence of tricuspid stenosis. Aortic Valve: The aortic valve was not well visualized. Aortic valve regurgitation is not visualized. No aortic stenosis is present. Aortic valve mean gradient measures 5.0 mmHg. Aortic valve peak gradient measures 7.4 mmHg. Aortic valve area, by VTI measures 1.37 cm. Pulmonic Valve: The pulmonic  valve was normal in structure. Pulmonic valve regurgitation is trivial. No evidence of pulmonic stenosis. Aorta: The aortic root is normal in size and structure. Venous: The inferior vena cava is normal in size with greater than 50% respiratory variability, suggesting right atrial pressure of 3 mmHg. IAS/Shunts: No atrial level shunt detected by color flow Doppler.  LEFT VENTRICLE PLAX 2D LVIDd:         4.00 cm      Diastology LVIDs:         2.50 cm      LV e' medial:    5.66 cm/s LV PW:         0.90 cm      LV E/e' medial:  9.4 LV IVS:        1.00 cm      LV e' lateral:   4.90 cm/s LVOT diam:     1.90 cm      LV E/e' lateral: 10.9 LV SV:         37 LV SV Index:   18 LVOT Area:     2.84 cm  LV Volumes (MOD) LV vol d, MOD A2C: 78.1 ml LV vol d, MOD A4C: 132.0 ml LV vol s, MOD A2C: 28.6 ml LV vol s, MOD A4C: 41.0 ml LV SV MOD A2C:     49.5 ml LV SV MOD A4C:     132.0 ml LV SV MOD BP:      68.1 ml RIGHT VENTRICLE RV Basal diam:  3.20 cm RV Mid diam:    2.60 cm RV S prime:     10.90 cm/s LEFT ATRIUM             Index        RIGHT ATRIUM            Index LA diam:        2.40 cm 1.14 cm/m   RA Area:     10.90 cm LA Vol (A2C):   25.2 ml 12.01 ml/m  RA Volume:   20.00 ml  9.53 ml/m LA Vol (A4C):   24.8 ml 11.82 ml/m LA Biplane Vol: 26.1 ml 12.44 ml/m  AORTIC VALVE                    PULMONIC VALVE AV Area (Vmax):    1.65 cm     PV Vmax:       0.88 m/s AV Area (Vmean):   1.32 cm     PV Peak grad:  3.1 mmHg AV Area (VTI):     1.37 cm AV Vmax:           136.00 cm/s AV Vmean:          98.300 cm/s AV VTI:            0.273 m AV Peak Grad:      7.4 mmHg AV Mean Grad:      5.0 mmHg LVOT Vmax:         79.10 cm/s LVOT Vmean:        45.900 cm/s LVOT VTI:          0.132 m LVOT/AV VTI ratio: 0.48  AORTA Ao Root diam: 2.90 cm Ao Asc diam:  3.30 cm MITRAL VALVE MV Area (PHT): 3.17 cm    SHUNTS MV Area VTI:   2.53 cm    Systemic VTI:  0.13 m MV Peak grad:  2.6 mmHg    Systemic Diam: 1.90 cm MV Mean  grad:  1.0 mmHg MV Vmax:       0.80 m/s MV Vmean:      54.1 cm/s MV Decel Time: 239 msec MV E velocity: 53.30 cm/s MV A velocity: 79.20 cm/s MV E/A ratio:  0.67 Armanda Magic MD Electronically signed by Armanda Magic MD Signature Date/Time: 05/28/2023/2:05:59 PM    Final            LOS: 2 days   Time spent= 35 mins    Miguel Rota, MD [facility]  If 7PM-7AM, please contact night-coverage  05/29/2023, 11:48 AM

## 2023-05-29 NOTE — Evaluation (Signed)
 Occupational Therapy Evaluation Patient Details Name: Denise Kelly MRN: [id number] DOB: 08-08-1940 Today's Date: 05/29/2023   History of Present Illness   83 y.o. presents to [facility] 05/26/23 due to weeping wound in the RLE found at living facility sitting in feces and urine. Urinalysis concerning for infection and CTA with signs of consolidation of R lower lobe.  Recent admit 2/4, 1/17 and 1/22 for UTI and BLE cellulitis. PMHx: alcohol abuse, anxiety/depression, GERD, HTN, HOH, obesity, chronic diastolic CHF, PE     Clinical Impressions Eval limited, pt required max encouragement for bed mobility to sit EOB, adamantly refused any OOB activity. PTA pt lived at at ALF and reports she's Ind with grooming, dressing and toileting; sponge bathes and does not receive any assistance for ADL tasks, but that is has become more difficult and hat she could walk with a RW. Pt currently requires min A with bed mobility rolling and max A to initiate sup - sit, however pt became agitated, refuse to continue and stated. "I'm tired of this, get me out of here". Educated pt on the benefits/importance of participation with therapies in acute care as well as post acute care settings. Pt requires extensive assist with ADLs at bed level at this time. OT will follow acutely to maximize level of function and safety    If plan is discharge home, recommend the following:   A lot of help with bathing/dressing/bathroom;A lot of help with walking and/or transfers;Assistance with cooking/housework;Direct supervision/assist for medications management;Assist for transportation;Help with stairs or ramp for entrance     Functional Status Assessment   Patient has had a recent decline in their functional status and demonstrates the ability to make significant improvements in function in a reasonable and predictable amount of time.     Equipment Recommendations   None recommended by OT     Recommendations for Other  Services         Precautions/Restrictions   Precautions Precautions: Fall Recall of Precautions/Restrictions: Impaired Restrictions Weight Bearing Restrictions Per Provider Order: No     Mobility Bed Mobility Overal bed mobility: Needs Assistance Bed Mobility: Rolling, Supine to Sit Rolling: Min assist, Used rails         General bed mobility comments: Pt required max enocuragement to agree to attempt sitting EOB, required max A to initiate sup - sit, but then began to refuse to complete and adamantly insiste don returning to supine and refused further participation    Transfers                   General transfer comment: Pt refused      Balance                                           ADL either performed or assessed with clinical judgement   ADL Overall ADL's : Needs assistance/impaired Eating/Feeding: Set up;Sitting;Bed level   Grooming: Wash/dry hands;Wash/dry face;Set up;Supervision/safety;Bed level   Upper Body Bathing: Total assistance   Lower Body Bathing: Total assistance   Upper Body Dressing : Total assistance   Lower Body Dressing: Total assistance                 General ADL Comments: pt refusing transfers/OOB activity     Vision Baseline Vision/History: 1 Wears glasses Ability to See in Adequate Light: 0 Adequate Patient Visual Report: No change from baseline  Perception         Praxis         Pertinent Vitals/Pain Pain Assessment Pain Assessment: Faces Faces Pain Scale: Hurts a little bit Pain Location: B LEs Pain Descriptors / Indicators: Discomfort, Grimacing Pain Intervention(s): Limited activity within patient's tolerance, Monitored during session, Repositioned     Extremity/Trunk Assessment Upper Extremity Assessment Upper Extremity Assessment: Generalized weakness   Lower Extremity Assessment Lower Extremity Assessment: Defer to PT evaluation   Cervical / Trunk  Assessment Cervical / Trunk Assessment: Normal   Communication Communication Communication: Impaired Factors Affecting Communication: Hearing impaired   Cognition Arousal: Alert Behavior During Therapy: Agitated, Anxious               OT - Cognition Comments: Pt required max enocuragement to agree to bed mobility to to attmept sitting EOB                 Following commands: Intact       Cueing  General Comments   Cueing Techniques: Verbal cues      Exercises     Shoulder Instructions      Home Living Family/patient expects to be discharged to:: Assisted living                             Home Equipment: Rolling Walker (2 wheels)   Additional Comments: difficult to get history, pt being vague, pt wears O2 at home      Prior Functioning/Environment Prior Level of Function : Needs assist       Physical Assist : Mobility (physical);ADLs (physical)   ADLs (physical): Bathing;Dressing;IADLs Mobility Comments: Pt reports that she could walk with a RW ADLs Comments: Pt reports she's Ind with grooming, dressing and toileting; sponge bathes and does not receive any assistance for ADL tasks, but that is has become more difficult.    OT Problem List: Decreased strength;Decreased safety awareness;Decreased activity tolerance;Decreased knowledge of use of DME or AE   OT Treatment/Interventions: Self-care/ADL training;DME and/or AE instruction;Therapeutic activities;Balance training;Therapeutic exercise;Patient/family education      OT Goals(Current goals can be found in the care plan section)   Acute Rehab OT Goals Patient Stated Goal: "get out of here" OT Goal Formulation: With patient Time For Goal Achievement: 06/12/23 Potential to Achieve Goals: Good ADL Goals Pt Will Perform Grooming: with contact guard assist;with supervision;with set-up;sitting Pt Will Perform Upper Body Bathing: with mod assist;with min assist;sitting Pt Will Perform  Upper Body Dressing: with mod assist;with min assist;sitting Pt Will Transfer to Toilet: with max assist;with mod assist;stand pivot transfer;bedside commode Pt Will Perform Toileting - Clothing Manipulation and hygiene: with max assist;with mod assist;sitting/lateral leans;sit to/from stand   OT Frequency:  Min 1X/week    Co-evaluation              AM-PAC OT "6 Clicks" Daily Activity     Outcome Measure Help from another person eating meals?: A Little Help from another person taking care of personal grooming?: A Little Help from another person toileting, which includes using toliet, bedpan, or urinal?: Total Help from another person bathing (including washing, rinsing, drying)?: Total Help from another person to put on and taking off regular upper body clothing?: Total Help from another person to put on and taking off regular lower body clothing?: Total 6 Click Score: 10   End of Session    Activity Tolerance: Treatment limited secondary to agitation Patient left: in bed;with call  bell/phone within reach;with chair alarm set  OT Visit Diagnosis: Other abnormalities of gait and mobility (R26.89);Muscle weakness (generalized) (M62.81)                Time: 4098-1191 OT Time Calculation (min): 23 min Charges:  OT General Charges $OT Visit: 1 Visit OT Evaluation $OT Eval Moderate Complexity: 1 Mod OT Treatments $Therapeutic Activity: 8-22 mins    Galen Manila 05/29/2023, 12:57 PM

## 2023-05-30 DIAGNOSIS — N10 Acute tubulo-interstitial nephritis: Secondary | ICD-10-CM | POA: Diagnosis not present

## 2023-05-30 LAB — CBC
HCT: 46.6 % — ABNORMAL HIGH (ref 36.0–46.0)
Hemoglobin: 15.4 g/dL — ABNORMAL HIGH (ref 12.0–15.0)
MCH: 28.6 pg (ref 26.0–34.0)
MCHC: 33 g/dL (ref 30.0–36.0)
MCV: 86.6 fL (ref 80.0–100.0)
Platelets: 169 10*3/uL (ref 150–400)
RBC: 5.38 MIL/uL — ABNORMAL HIGH (ref 3.87–5.11)
RDW: 13.8 % (ref 11.5–15.5)
WBC: 6.7 10*3/uL (ref 4.0–10.5)
nRBC: 0 % (ref 0.0–0.2)

## 2023-05-30 LAB — BASIC METABOLIC PANEL
Anion gap: 12 (ref 5–15)
BUN: 8 mg/dL (ref 8–23)
CO2: 23 mmol/L (ref 22–32)
Calcium: 8.7 mg/dL — ABNORMAL LOW (ref 8.9–10.3)
Chloride: 104 mmol/L (ref 98–111)
Creatinine, Ser: 0.81 mg/dL (ref 0.44–1.00)
GFR, Estimated: >60 mL/min (ref >60)
Glucose, Bld: 91 mg/dL (ref 70–99)
Potassium: 4.5 mmol/L (ref 3.5–5.1)
Sodium: 139 mmol/L (ref 135–145)

## 2023-05-30 LAB — VANCOMYCIN, PEAK
Vancomycin Pk: 27 ug/mL — ABNORMAL LOW (ref 30–40)
Vancomycin Pk: 17 ug/mL — ABNORMAL LOW (ref 30–40)

## 2023-05-30 LAB — MAGNESIUM: Magnesium: 1.9 mg/dL (ref 1.7–2.4)

## 2023-05-30 MED ORDER — VANCOMYCIN HCL 1750 MG/350ML IV SOLN
1750.0000 mg | INTRAVENOUS | Status: DC
  Administered 2023-05-31 – 2023-06-03 (×4): 1750 mg via INTRAVENOUS
  Filled 2023-05-30 (×4): qty 350

## 2023-05-30 NOTE — Plan of Care (Signed)

## 2023-05-30 NOTE — Progress Notes (Signed)
 Pharmacy Antibiotic Note  Denise Kelly is a 83 y.o. female admitted on 05/26/2023 with bacteremia.  Pharmacy has been consulted for vancomycin dosing.  Per ID, bateremia most likely due to translocation from GI tract. TTE performed 3/14 with no signs of vegetations, ID recommends TEE but pt is hesitant.   Vancomycin peak drawn today is 17 mcg/ml, suspect AUC will be below goal so will empirically increase vancomycin dosing.  Plan: Adjust vancomycin to 1750mg  IV q24h - first dose in 12 hours since vancomycin is therapeutic for now   Height: 5\' 7"  (170.2 cm) Weight: 99 kg (218 lb 4.1 oz) IBW/kg (Calculated) : 61.6  Temp (24hrs), Avg:98 F (36.7 C), Min:97.6 F (36.4 C), Max:98.3 F (36.8 C)  Recent Labs  Lab 05/26/23 2001 05/26/23 2006 05/27/23 0523 05/28/23 0841 05/29/23 0740 05/30/23 0638 05/30/23 1948 05/30/23 2056  WBC 7.2  --  6.6 5.9 5.7 6.7  --   --   CREATININE 0.78 0.70 0.74 0.48 0.61 0.81  --   --   VANCOPEAK  --   --   --   --   --   --  27* 17*    Estimated Creatinine Clearance: 65.9 mL/min (by C-G formula based on SCr of 0.81 mg/dL).    Allergies  Allergen Reactions   Sulfa Antibiotics Hives, Swelling and Other (See Comments)    Facial/eye swelling    Coreg [Carvedilol] Other (See Comments)    Memory loss   Motrin [Ibuprofen] Palpitations   Toprol Xl [Metoprolol Tartrate] Cough   Doxycycline Hyclate Other (See Comments)    HEARTBURN   Flovent Hfa [Fluticasone] Itching   Statins Other (See Comments)    MENTAL STATUS CHANGE    Antimicrobials this admission: Ceftriaxone 3/11> Vanc 3/12>  Dose adjustments this admission:  Microbiology results: 3/11: 1/3 BCX E Faecalis, Staph epi, Staph lugdenesis (mecA+) 3/12 Cdiff neg 3/12 GI panel: neg 3/12 UCX: 3/13 Bcx: ngtd  Thank you for involving pharmacy in the patient's care.    Fredonia Highland, PharmD, BCPS, [facility] Clinical Pharmacist [phone] Please check AMION for all [facility]  numbers 05/30/2023

## 2023-05-30 NOTE — Progress Notes (Signed)
 PROGRESS NOTE    Denise Kelly  NWG:[id number] DOB: [birth year] DOA: 05/26/2023 PCP: Eloisa Northern, MD    Brief Narrative:  83 year old with history of alcohol use, anxiety, HTN comes to the ED with confusion.  Reports of weeping wound in the right lower extremity found at her living facility sitting in feces and urine.  In the ER noted to have concerns of urinary tract infection, right lower lobe consolidation.  She also reported diarrhea, reportedly has had norovirus going around in her facility.  Eventually cultures grew multiple species with some concerns of possible contaminated therefore repeat surveillance cultures ordered.  Infectious disease also consulted for their input.  In terms of her diarrhea GI panel and C. difficile studies were negative.  Echocardiogram is negative for vegetation.  TEE recommended.   Assessment & Plan:  Principal Problem:   Urinary tract infection   Acute metabolic encephalopathy Multi organism bacteremia - Multifactorial.  Cultures have been reviewed.  ID consulted.  Currently on IV vancomycin, echocardiogram shows preserved EF without evidence of vegetation.  Agreeable for TEE today.  Notified cardiology to help make arrangements -Repeat cultures remain negative  Diarrhea, resolved - Norovirus cases reported at her facility.  GI panel and C. difficile is negative here.  Hypokalemia/hypocalcemia - As needed repletion    Chronic diastolic CHF, 65% - Overall appears to be compensated.  Prior echocardiogram showing EF of 65%  Chronic venous stasis lower extremity bilaterally - Recently treated 7 days of IV Rocephin about 5 weeks ago  GERD - PPI  PE, per history -Continue outpatient Eliquis   DVT prophylaxis:Eliquis    Code Status: Full Code Family Communication: Does not want me to call any family Status is: In.  Continue hospital stay for diarrhea evaluation and IV antibiotics    Subjective: Seen at bedside.  After prolonged discussion,  she states she is agreeable for TEE  Examination:  General exam: Appears calm and comfortable  Respiratory system: Clear to auscultation. Respiratory effort normal. Cardiovascular system: S1 & S2 heard, RRR. No JVD, murmurs, rubs, gallops or clicks. No pedal edema. Gastrointestinal system: Abdomen is nondistended, soft and nontender. No organomegaly or masses felt. Normal bowel sounds heard. Central nervous system: Alert and oriented. No focal neurological deficits. Extremities: Symmetric 5 x 5 power. Skin: No rashes, lesions or ulcers Psychiatry: Judgement and insight appear normal. Mood & affect appropriate.                Diet Orders (From admission, onward)     Start     Ordered   05/27/23 0138  Diet regular Room service appropriate? Yes; Fluid consistency: Thin  Diet effective now       Question Answer Comment  Room service appropriate? Yes   Fluid consistency: Thin      05/27/23 0138            Objective: Vitals:   05/29/23 1524 05/29/23 2104 05/30/23 0419 05/30/23 0840  BP: 136/87 (!) 141/94 135/62 (!) 143/68  Pulse: 74 83 81 80  Resp: 17 20 19    Temp: 97.6 F (36.4 C) 97.6 F (36.4 C) 97.6 F (36.4 C) 98.3 F (36.8 C)  TempSrc: Oral Oral Oral   SpO2: 92% 96% 98% 93%  Weight:      Height:        Intake/Output Summary (Last 24 hours) at 05/30/2023 1100 Last data filed at 05/30/2023 0422 Gross per 24 hour  Intake --  Output 1200 ml  Net -1200 ml  Filed Weights   05/27/23 0115  Weight: 99 kg    Scheduled Meds:  apixaban  5 mg Oral BID   gabapentin  200 mg Oral TID   hydrocerin   Topical BID   Continuous Infusions:  vancomycin 1,250 mg (05/29/23 1802)    Nutritional status     Body mass index is 34.18 kg/m.  Data Reviewed:   CBC: Recent Labs  Lab 05/26/23 2001 05/26/23 2006 05/27/23 2956 05/28/23 0841 05/29/23 0740 05/30/23 0638  WBC 7.2  --  6.6 5.9 5.7 6.7  NEUTROABS 4.9  --   --   --   --   --   HGB 15.7* 15.3*  14.9 15.1* 14.1 15.4*  HCT 46.3* 45.0 44.1 45.8 43.0 46.6*  MCV 84.2  --  84.5 85.3 86.2 86.6  PLT 221  --  193 162 170 169   Basic Metabolic Panel: Recent Labs  Lab 05/26/23 2001 05/26/23 2006 05/27/23 0523 05/28/23 0841 05/29/23 0740 05/30/23 0638  NA 140 141 139 139 141 139  K 3.5 3.6 3.0* 3.1* 3.6 4.5  CL 102 104 109 106 105 104  CO2 23  --  23 22 27 23   GLUCOSE 134* 130* 97 86 116* 91  BUN 11 14 10  7* 7* 8  CREATININE 0.78 0.70 0.74 0.48 0.61 0.81  CALCIUM 8.9  --  8.5* 8.5* 8.3* 8.7*  MG 2.0  --   --  1.7 1.7 1.9  PHOS  --   --   --  3.2  --   --    GFR: Estimated Creatinine Clearance: 65.9 mL/min (by C-G formula based on SCr of 0.81 mg/dL). Liver Function Tests: Recent Labs  Lab 05/26/23 2001  AST 36  ALT 24  ALKPHOS 81  BILITOT 1.6*  PROT 7.1  ALBUMIN 3.5   No results for input(s): "LIPASE", "AMYLASE" in the last 168 hours. No results for input(s): "AMMONIA" in the last 168 hours. Coagulation Profile: No results for input(s): "INR", "PROTIME" in the last 168 hours. Cardiac Enzymes: No results for input(s): "CKTOTAL", "CKMB", "CKMBINDEX", "TROPONINI" in the last 168 hours. BNP (last 3 results) No results for input(s): "PROBNP" in the last 8760 hours. HbA1C: No results for input(s): "HGBA1C" in the last 72 hours. CBG: No results for input(s): "GLUCAP" in the last 168 hours. Lipid Profile: No results for input(s): "CHOL", "HDL", "LDLCALC", "TRIG", "CHOLHDL", "LDLDIRECT" in the last 72 hours. Thyroid Function Tests: No results for input(s): "TSH", "T4TOTAL", "FREET4", "T3FREE", "THYROIDAB" in the last 72 hours. Anemia Panel: No results for input(s): "VITAMINB12", "FOLATE", "FERRITIN", "TIBC", "IRON", "RETICCTPCT" in the last 72 hours. Sepsis Labs: No results for input(s): "PROCALCITON", "LATICACIDVEN" in the last 168 hours.  Recent Results (from the past 240 hours)  Culture, blood (Routine X 2) w Reflex to ID Panel     Status: Abnormal (Preliminary  result)   Collection Time: 05/26/23  8:01 PM   Specimen: BLOOD  Result Value Ref Range Status   Specimen Description BLOOD LEFT ANTECUBITAL  Final   Special Requests   Final    BOTTLES DRAWN AEROBIC AND ANAEROBIC Blood Culture adequate volume   Culture  Setup Time   Final    GRAM POSITIVE COCCI IN CHAINS IN BOTH AEROBIC AND ANAEROBIC BOTTLES CRITICAL RESULT CALLED TO, READ BACK BY AND VERIFIED WITH: PHARMD Dollene Cleveland @1646  FH    Culture (A)  Final    ENTEROCOCCUS FAECALIS CULTURE REINCUBATED FOR BETTER GROWTH STAPHYLOCOCCUS EPIDERMIDIS ATTEMPTING TO ISOLATE FOR WORKUP  Performed at [facility], [address], [place], [place] [address]    Report Status PENDING  Incomplete  Blood Culture ID Panel (Reflexed)     Status: Abnormal   Collection Time: 05/26/23  8:01 PM  Result Value Ref Range Status   Enterococcus faecalis DETECTED (A) NOT DETECTED Final    Comment: CRITICAL RESULT CALLED TO, READ BACK BY AND VERIFIED WITH: PHARMD Dollene Cleveland @1646  FH    Enterococcus Faecium NOT DETECTED NOT DETECTED Final   Listeria monocytogenes NOT DETECTED NOT DETECTED Final   Staphylococcus species DETECTED (A) NOT DETECTED Final    Comment: CRITICAL RESULT CALLED TO, READ BACK BY AND VERIFIED WITH: PHARMD Dollene Cleveland @1646  FH    Staphylococcus aureus (BCID) NOT DETECTED NOT DETECTED Final   Staphylococcus epidermidis DETECTED (A) NOT DETECTED Final    Comment: Methicillin (oxacillin) resistant coagulase negative staphylococcus. Possible blood culture contaminant (unless isolated from more than one blood culture draw or clinical case suggests pathogenicity). No antibiotic treatment is indicated for blood  culture contaminants. CRITICAL RESULT CALLED TO, READ BACK BY AND VERIFIED WITH: PHARMD Dollene Cleveland @1646  FH    Staphylococcus lugdunensis DETECTED (A) NOT DETECTED Final    Comment: Methicillin (oxacillin) resistant coagulase negative staphylococcus.  Possible blood culture contaminant (unless isolated from more than one blood culture draw or clinical case suggests pathogenicity). No antibiotic treatment is indicated for blood  culture contaminants. CRITICAL RESULT CALLED TO, READ BACK BY AND VERIFIED WITH: PHARMD Dollene Cleveland @1646  FH    Streptococcus species NOT DETECTED NOT DETECTED Final   Streptococcus agalactiae NOT DETECTED NOT DETECTED Final   Streptococcus pneumoniae NOT DETECTED NOT DETECTED Final   Streptococcus pyogenes NOT DETECTED NOT DETECTED Final   A.calcoaceticus-baumannii NOT DETECTED NOT DETECTED Final   Bacteroides fragilis NOT DETECTED NOT DETECTED Final   Enterobacterales NOT DETECTED NOT DETECTED Final   Enterobacter cloacae complex NOT DETECTED NOT DETECTED Final   Escherichia coli NOT DETECTED NOT DETECTED Final   Klebsiella aerogenes NOT DETECTED NOT DETECTED Final   Klebsiella oxytoca NOT DETECTED NOT DETECTED Final   Klebsiella pneumoniae NOT DETECTED NOT DETECTED Final   Proteus species NOT DETECTED NOT DETECTED Final   Salmonella species NOT DETECTED NOT DETECTED Final   Serratia marcescens NOT DETECTED NOT DETECTED Final   Haemophilus influenzae NOT DETECTED NOT DETECTED Final   Neisseria meningitidis NOT DETECTED NOT DETECTED Final   Pseudomonas aeruginosa NOT DETECTED NOT DETECTED Final   Stenotrophomonas maltophilia NOT DETECTED NOT DETECTED Final   Candida albicans NOT DETECTED NOT DETECTED Final   Candida auris NOT DETECTED NOT DETECTED Final   Candida glabrata NOT DETECTED NOT DETECTED Final   Candida krusei NOT DETECTED NOT DETECTED Final   Candida parapsilosis NOT DETECTED NOT DETECTED Final   Candida tropicalis NOT DETECTED NOT DETECTED Final   Cryptococcus neoformans/gattii NOT DETECTED NOT DETECTED Final   Methicillin resistance mecA/C DETECTED (A) NOT DETECTED Final    Comment: CRITICAL RESULT CALLED TO, READ BACK BY AND VERIFIED WITH: PHARMD Dollene Cleveland @1646  FH     Vancomycin resistance NOT DETECTED NOT DETECTED Final    Comment: Performed at [facility], [address], [place], [place] [address]  Culture, blood (Routine X 2) w Reflex to ID Panel     Status: None (Preliminary result)   Collection Time: 05/26/23  8:07 PM   Specimen: BLOOD LEFT ARM  Result Value Ref Range Status   Specimen Description BLOOD LEFT  ARM  Final   Special Requests   Final    BOTTLES DRAWN AEROBIC ONLY Blood Culture results may not be optimal due to an inadequate volume of blood received in culture bottles   Culture   Final    NO GROWTH 4 DAYS Performed at [facility], [address] [place], [place] [address]    Report Status PENDING  Incomplete  C Difficile Quick Screen w PCR reflex     Status: None   Collection Time: 05/27/23  1:39 AM   Specimen: STOOL  Result Value Ref Range Status   C Diff antigen NEGATIVE NEGATIVE Final   C Diff toxin NEGATIVE NEGATIVE Final   C Diff interpretation No C. difficile detected.  Final    Comment: Performed at [facility], [address] [place], [place] [address]  Gastrointestinal Panel by PCR , Stool     Status: None   Collection Time: 05/27/23  1:39 AM   Specimen: STOOL  Result Value Ref Range Status   Campylobacter species NOT DETECTED NOT DETECTED Final   Plesimonas shigelloides NOT DETECTED NOT DETECTED Final   Salmonella species NOT DETECTED NOT DETECTED Final   Yersinia enterocolitica NOT DETECTED NOT DETECTED Final   Vibrio species NOT DETECTED NOT DETECTED Final   Vibrio cholerae NOT DETECTED NOT DETECTED Final   Enteroaggregative E coli (EAEC) NOT DETECTED NOT DETECTED Final   Enteropathogenic E coli (EPEC) NOT DETECTED NOT DETECTED Final   Enterotoxigenic E coli (ETEC) NOT DETECTED NOT DETECTED Final   Shiga like toxin producing E coli (STEC) NOT DETECTED NOT DETECTED Final   Shigella/Enteroinvasive E coli (EIEC) NOT DETECTED NOT DETECTED Final   Cryptosporidium NOT DETECTED NOT DETECTED Final    Cyclospora cayetanensis NOT DETECTED NOT DETECTED Final   Entamoeba histolytica NOT DETECTED NOT DETECTED Final   Giardia lamblia NOT DETECTED NOT DETECTED Final   Adenovirus F40/41 NOT DETECTED NOT DETECTED Final   Astrovirus NOT DETECTED NOT DETECTED Final   Norovirus GI/GII NOT DETECTED NOT DETECTED Final   Rotavirus A NOT DETECTED NOT DETECTED Final   Sapovirus (I, II, IV, and V) NOT DETECTED NOT DETECTED Final    Comment: Performed at [facility], [address] [place], [place] [address]  Culture, blood (Routine X 2) w Reflex to ID Panel     Status: None (Preliminary result)   Collection Time: 05/28/23  8:41 AM   Specimen: BLOOD  Result Value Ref Range Status   Specimen Description BLOOD SITE NOT SPECIFIED  Final   Special Requests   Final    BOTTLES DRAWN AEROBIC AND ANAEROBIC Blood Culture results may not be optimal due to an inadequate volume of blood received in culture bottles   Culture   Final    NO GROWTH 2 DAYS Performed at [facility], [address], [place], [place] [address]    Report Status PENDING  Incomplete  Culture, blood (Routine X 2) w Reflex to ID Panel     Status: None (Preliminary result)   Collection Time: 05/28/23  8:41 AM   Specimen: BLOOD  Result Value Ref Range Status   Specimen Description BLOOD SITE NOT SPECIFIED  Final   Special Requests   Final    BOTTLES DRAWN AEROBIC AND ANAEROBIC Blood Culture results may not be optimal due to an inadequate volume of blood received in culture bottles   Culture   Final    NO GROWTH 2 DAYS Performed at [facility]  Lab, [address] [place], [place] [address]    Report Status PENDING  Incomplete         Radiology Studies: ECHOCARDIOGRAM COMPLETE Result Date: 05/28/2023    ECHOCARDIOGRAM REPORT   Patient Name:   Denise Kelly Date of Exam: 05/28/2023 Medical Rec #:  [id number]       Height:       67.0 in Accession #:    [id number]      Weight:       218.3 lb Date of Birth:   [birth year]       BSA:          2.099 m Patient Age:    82 years        BP:           135/64 mmHg Patient Gender: F               HR:           86 bpm. Exam Location:  Inpatient Procedure: 2D Echo, Cardiac Doppler and Color Doppler (Both Spectral and Color            Flow Doppler were utilized during procedure). Indications:    Endocarditis, bacteremia  History:        Patient has prior history of Echocardiogram examinations, most                 recent 04/22/2023. CHF, Signs/Symptoms:Altered Mental Status; Risk                 Factors:Hypertension.  Sonographer:    Vern Claude Referring Phys: [id number] Doreen Salvage Skylan Lara IMPRESSIONS  1. Left ventricular ejection fraction, by estimation, is 65 to 70%. The left ventricle has normal function. Left ventricular endocardial border not optimally defined to evaluate regional wall motion. Left ventricular diastolic parameters are consistent with Grade I diastolic dysfunction (impaired relaxation).  2. Right ventricular systolic function is normal. The right ventricular size is normal.  3. The mitral valve is normal in structure. No evidence of mitral valve regurgitation. No evidence of mitral stenosis.  4. The aortic valve was not well visualized. Aortic valve regurgitation is not visualized. No aortic stenosis is present.  5. The inferior vena cava is normal in size with greater than 50% respiratory variability, suggesting right atrial pressure of 3 mmHg. Conclusion(s)/Recommendation(s): Study images insufficient to rul eout valvular vegetations on this transthoracic echocardiogram. Consider a transesophageal echocardiogram to exclude infective endocarditis if clinically indicated. FINDINGS  Left Ventricle: Left ventricular ejection fraction, by estimation, is 65 to 70%. The left ventricle has normal function. Left ventricular endocardial border not optimally defined to evaluate regional wall motion. The left ventricular internal cavity size was normal in size. There is no left  ventricular hypertrophy. Left ventricular diastolic parameters are consistent with Grade I diastolic dysfunction (impaired relaxation). Normal left ventricular filling pressure. Right Ventricle: The right ventricular size is normal. No increase in right ventricular wall thickness. Right ventricular systolic function is normal. Left Atrium: Left atrial size was normal in size. Right Atrium: Right atrial size was normal in size. Pericardium: There is no evidence of pericardial effusion. Mitral Valve: The mitral valve is normal in structure. Mild mitral annular calcification. No evidence of mitral valve regurgitation. No evidence of mitral valve stenosis. MV peak gradient, 2.6 mmHg. The mean mitral valve gradient is 1.0 mmHg. Tricuspid Valve: The tricuspid valve is normal in structure. Tricuspid valve regurgitation is not demonstrated. No evidence of tricuspid stenosis. Aortic Valve: The aortic  valve was not well visualized. Aortic valve regurgitation is not visualized. No aortic stenosis is present. Aortic valve mean gradient measures 5.0 mmHg. Aortic valve peak gradient measures 7.4 mmHg. Aortic valve area, by VTI measures 1.37 cm. Pulmonic Valve: The pulmonic valve was normal in structure. Pulmonic valve regurgitation is trivial. No evidence of pulmonic stenosis. Aorta: The aortic root is normal in size and structure. Venous: The inferior vena cava is normal in size with greater than 50% respiratory variability, suggesting right atrial pressure of 3 mmHg. IAS/Shunts: No atrial level shunt detected by color flow Doppler.  LEFT VENTRICLE PLAX 2D LVIDd:         4.00 cm      Diastology LVIDs:         2.50 cm      LV e' medial:    5.66 cm/s LV PW:         0.90 cm      LV E/e' medial:  9.4 LV IVS:        1.00 cm      LV e' lateral:   4.90 cm/s LVOT diam:     1.90 cm      LV E/e' lateral: 10.9 LV SV:         37 LV SV Index:   18 LVOT Area:     2.84 cm  LV Volumes (MOD) LV vol d, MOD A2C: 78.1 ml LV vol d, MOD A4C: 132.0  ml LV vol s, MOD A2C: 28.6 ml LV vol s, MOD A4C: 41.0 ml LV SV MOD A2C:     49.5 ml LV SV MOD A4C:     132.0 ml LV SV MOD BP:      68.1 ml RIGHT VENTRICLE RV Basal diam:  3.20 cm RV Mid diam:    2.60 cm RV S prime:     10.90 cm/s LEFT ATRIUM             Index        RIGHT ATRIUM           Index LA diam:        2.40 cm 1.14 cm/m   RA Area:     10.90 cm LA Vol (A2C):   25.2 ml 12.01 ml/m  RA Volume:   20.00 ml  9.53 ml/m LA Vol (A4C):   24.8 ml 11.82 ml/m LA Biplane Vol: 26.1 ml 12.44 ml/m  AORTIC VALVE                    PULMONIC VALVE AV Area (Vmax):    1.65 cm     PV Vmax:       0.88 m/s AV Area (Vmean):   1.32 cm     PV Peak grad:  3.1 mmHg AV Area (VTI):     1.37 cm AV Vmax:           136.00 cm/s AV Vmean:          98.300 cm/s AV VTI:            0.273 m AV Peak Grad:      7.4 mmHg AV Mean Grad:      5.0 mmHg LVOT Vmax:         79.10 cm/s LVOT Vmean:        45.900 cm/s LVOT VTI:          0.132 m LVOT/AV VTI ratio: 0.48  AORTA Ao Root diam: 2.90 cm Ao Asc diam:  3.30  cm MITRAL VALVE MV Area (PHT): 3.17 cm    SHUNTS MV Area VTI:   2.53 cm    Systemic VTI:  0.13 m MV Peak grad:  2.6 mmHg    Systemic Diam: 1.90 cm MV Mean grad:  1.0 mmHg MV Vmax:       0.80 m/s MV Vmean:      54.1 cm/s MV Decel Time: 239 msec MV E velocity: 53.30 cm/s MV A velocity: 79.20 cm/s MV E/A ratio:  0.67 Armanda Magic MD Electronically signed by Armanda Magic MD Signature Date/Time: 05/28/2023/2:05:59 PM    Final            LOS: 3 days   Time spent= 35 mins    Miguel Rota, MD [facility]  If 7PM-7AM, please contact night-coverage  05/30/2023, 11:00 AM

## 2023-05-30 NOTE — Progress Notes (Addendum)
 Pharmacy Antibiotic Note  Denise Kelly is a 83 y.o. female admitted on 05/26/2023 with bacteremia.  Pharmacy has been consulted for vancomycin dosing.  Per ID, bateremia most likely due to translocation from GI tract. TTE performed 3/14 with no signs of vegetations, ID recommends TEE but pt is hesitant. WBC wnl, afebrile, renal function stable.  Plan: Continue vancomycin 1250mg  IV q 24h (eAUC 522) Will order VP for 3/15 2030 (1 hour post infusion) and VT 3/16 1730  -goal trough 15-58mcg/mL, Auc 400-600 F/u cx, renal fxn and clinical picture   Height: 5\' 7"  (170.2 cm) Weight: 99 kg (218 lb 4.1 oz) IBW/kg (Calculated) : 61.6  Temp (24hrs), Avg:97.7 F (36.5 C), Min:97.3 F (36.3 C), Max:98.3 F (36.8 C)  Recent Labs  Lab 05/26/23 2001 05/26/23 2006 05/27/23 0523 05/28/23 0841 05/29/23 0740 05/30/23 0638  WBC 7.2  --  6.6 5.9 5.7 6.7  CREATININE 0.78 0.70 0.74 0.48 0.61 0.81    Estimated Creatinine Clearance: 65.9 mL/min (by C-G formula based on SCr of 0.81 mg/dL).    Allergies  Allergen Reactions   Sulfa Antibiotics Hives, Swelling and Other (See Comments)    Facial/eye swelling    Coreg [Carvedilol] Other (See Comments)    Memory loss   Motrin [Ibuprofen] Palpitations   Toprol Xl [Metoprolol Tartrate] Cough   Doxycycline Hyclate Other (See Comments)    HEARTBURN   Flovent Hfa [Fluticasone] Itching   Statins Other (See Comments)    MENTAL STATUS CHANGE    Antimicrobials this admission: Ceftriaxone 3/11> Vanc 3/12>  Dose adjustments this admission:  Microbiology results: 3/11: 1/3 BCX E Faecalis, Staph epi, Staph lugdenesis (mecA+) 3/12 Cdiff neg 3/12 GI panel: neg 3/12 UCX: 3/13 Bcx: ngtd  Thank you for involving pharmacy in the patient's care.   Theotis Burrow, PharmD PGY1 Acute Care Pharmacy Resident  05/30/2023 9:35 AM

## 2023-05-31 DIAGNOSIS — N3000 Acute cystitis without hematuria: Secondary | ICD-10-CM | POA: Diagnosis not present

## 2023-05-31 LAB — BASIC METABOLIC PANEL
Anion gap: 5 (ref 5–15)
BUN: 12 mg/dL (ref 8–23)
CO2: 31 mmol/L (ref 22–32)
Calcium: 8.4 mg/dL — ABNORMAL LOW (ref 8.9–10.3)
Chloride: 101 mmol/L (ref 98–111)
Creatinine, Ser: 0.85 mg/dL (ref 0.44–1.00)
GFR, Estimated: >60 mL/min (ref >60)
Glucose, Bld: 122 mg/dL — ABNORMAL HIGH (ref 70–99)
Potassium: 3.9 mmol/L (ref 3.5–5.1)
Sodium: 137 mmol/L (ref 135–145)

## 2023-05-31 LAB — CBC
HCT: 43.9 % (ref 36.0–46.0)
Hemoglobin: 14.5 g/dL (ref 12.0–15.0)
MCH: 28.4 pg (ref 26.0–34.0)
MCHC: 33 g/dL (ref 30.0–36.0)
MCV: 86.1 fL (ref 80.0–100.0)
Platelets: 176 10*3/uL (ref 150–400)
RBC: 5.1 MIL/uL (ref 3.87–5.11)
RDW: 13.6 % (ref 11.5–15.5)
WBC: 6.3 10*3/uL (ref 4.0–10.5)
nRBC: 0 % (ref 0.0–0.2)

## 2023-05-31 LAB — CULTURE, BLOOD (ROUTINE X 2): Culture: NO GROWTH

## 2023-05-31 LAB — MAGNESIUM: Magnesium: 1.8 mg/dL (ref 1.7–2.4)

## 2023-05-31 MED ORDER — PRAMIPEXOLE DIHYDROCHLORIDE 0.25 MG PO TABS
0.2500 mg | ORAL_TABLET | Freq: Three times a day (TID) | ORAL | Status: DC
  Administered 2023-05-31 – 2023-06-10 (×30): 0.25 mg via ORAL
  Filled 2023-05-31 (×31): qty 1

## 2023-05-31 NOTE — Plan of Care (Signed)

## 2023-05-31 NOTE — Progress Notes (Signed)
 PROGRESS NOTE    Denise Kelly  ONG:[id number] DOB: May 15, 1940 DOA: 05/26/2023 PCP: Eloisa Northern, MD    Brief Narrative:  83 year old with history of alcohol use, anxiety, HTN comes to the ED with confusion.  Reports of weeping wound in the right lower extremity found at her living facility sitting in feces and urine.  In the ER noted to have concerns of urinary tract infection, right lower lobe consolidation.  She also reported diarrhea, reportedly has had norovirus going around in her facility.  Eventually cultures grew multiple species with some concerns of possible contaminated therefore repeat surveillance cultures ordered.  Infectious disease also consulted for their input.  In terms of her diarrhea GI panel and C. difficile studies were negative.  Echocardiogram is negative for vegetation.  TEE recommended.   Assessment & Plan:  Principal Problem:   Urinary tract infection   Acute metabolic encephalopathy Multi organism bacteremia - Multifactorial.  Cultures have been reviewed.  ID consulted.  Currently on IV vancomycin, echocardiogram shows preserved EF without evidence of vegetation.  Agreeable for TEE today.  Notified cardiology to help make arrangements -Repeat cultures remain negative   Diarrhea, resolved - Norovirus cases reported at her facility.  GI panel and C. difficile is negative here.  Hypokalemia/hypocalcemia - As needed repletion    Chronic diastolic CHF, 65% - Overall appears to be compensated.  Prior echocardiogram showing EF of 65%  Chronic venous stasis lower extremity bilaterally - Recently treated 7 days of IV Rocephin about 5 weeks ago  GERD - PPI  PE, per history -Continue outpatient Eliquis   DVT prophylaxis:Eliquis    Code Status: Full Code Family Communication: Does not want me to call any family Status is: In.  Continue hospital stay for diarrhea evaluation and IV antibiotics    Subjective: Feeling sleepy but otherwise tells me does  have any complaints at this time  Examination:  General exam: Appears calm and comfortable  Respiratory system: Clear to auscultation. Respiratory effort normal. Cardiovascular system: S1 & S2 heard, RRR. No JVD, murmurs, rubs, gallops or clicks. No pedal edema. Gastrointestinal system: Abdomen is nondistended, soft and nontender. No organomegaly or masses felt. Normal bowel sounds heard. Central nervous system: Alert and oriented. No focal neurological deficits. Extremities: Symmetric 5 x 5 power. Skin: No rashes, lesions or ulcers Psychiatry: Judgement and insight appear normal. Mood & affect appropriate.                Diet Orders (From admission, onward)     Start     Ordered   06/01/23 0001  Diet NPO time specified  Diet effective midnight        05/30/23 1108   05/27/23 0138  Diet regular Room service appropriate? Yes; Fluid consistency: Thin  Diet effective now       Question Answer Comment  Room service appropriate? Yes   Fluid consistency: Thin      05/27/23 0138            Objective: Vitals:   05/30/23 1700 05/30/23 2025 05/31/23 0346 05/31/23 0745  BP: 137/77 (!) 147/72 123/62 (!) 126/59  Pulse: 82 88 83 79  Resp:  18 18 18   Temp: 97.9 F (36.6 C) 98 F (36.7 C) 97.9 F (36.6 C) 98 F (36.7 C)  TempSrc:  Oral Oral   SpO2: 97% 95% 94% 95%  Weight:      Height:        Intake/Output Summary (Last 24 hours) at 05/31/2023 1047  Last data filed at 05/31/2023 1015 Gross per 24 hour  Intake 300 ml  Output 500 ml  Net -200 ml   Filed Weights   05/27/23 0115  Weight: 99 kg    Scheduled Meds:  apixaban  5 mg Oral BID   gabapentin  200 mg Oral TID   hydrocerin   Topical BID   Continuous Infusions:  vancomycin 1,750 mg (05/31/23 0923)    Nutritional status     Body mass index is 34.18 kg/m.  Data Reviewed:   CBC: Recent Labs  Lab 05/26/23 2001 05/26/23 2006 05/27/23 6578 05/28/23 0841 05/29/23 0740 05/30/23 4696  05/31/23 0729  WBC 7.2  --  6.6 5.9 5.7 6.7 6.3  NEUTROABS 4.9  --   --   --   --   --   --   HGB 15.7*   < > 14.9 15.1* 14.1 15.4* 14.5  HCT 46.3*   < > 44.1 45.8 43.0 46.6* 43.9  MCV 84.2  --  84.5 85.3 86.2 86.6 86.1  PLT 221  --  193 162 170 169 176   < > = values in this interval not displayed.   Basic Metabolic Panel: Recent Labs  Lab 05/26/23 2001 05/26/23 2006 05/27/23 2952 05/28/23 0841 05/29/23 0740 05/30/23 0638 05/31/23 0729  NA 140   < > 139 139 141 139 137  K 3.5   < > 3.0* 3.1* 3.6 4.5 3.9  CL 102   < > 109 106 105 104 101  CO2 23  --  23 22 27 23 31   GLUCOSE 134*   < > 97 86 116* 91 122*  BUN 11   < > 10 7* 7* 8 12  CREATININE 0.78   < > 0.74 0.48 0.61 0.81 0.85  CALCIUM 8.9  --  8.5* 8.5* 8.3* 8.7* 8.4*  MG 2.0  --   --  1.7 1.7 1.9 1.8  PHOS  --   --   --  3.2  --   --   --    < > = values in this interval not displayed.   GFR: Estimated Creatinine Clearance: 62.8 mL/min (by C-G formula based on SCr of 0.85 mg/dL). Liver Function Tests: Recent Labs  Lab 05/26/23 2001  AST 36  ALT 24  ALKPHOS 81  BILITOT 1.6*  PROT 7.1  ALBUMIN 3.5   No results for input(s): "LIPASE", "AMYLASE" in the last 168 hours. No results for input(s): "AMMONIA" in the last 168 hours. Coagulation Profile: No results for input(s): "INR", "PROTIME" in the last 168 hours. Cardiac Enzymes: No results for input(s): "CKTOTAL", "CKMB", "CKMBINDEX", "TROPONINI" in the last 168 hours. BNP (last 3 results) No results for input(s): "PROBNP" in the last 8760 hours. HbA1C: No results for input(s): "HGBA1C" in the last 72 hours. CBG: No results for input(s): "GLUCAP" in the last 168 hours. Lipid Profile: No results for input(s): "CHOL", "HDL", "LDLCALC", "TRIG", "CHOLHDL", "LDLDIRECT" in the last 72 hours. Thyroid Function Tests: No results for input(s): "TSH", "T4TOTAL", "FREET4", "T3FREE", "THYROIDAB" in the last 72 hours. Anemia Panel: No results for input(s): "VITAMINB12",  "FOLATE", "FERRITIN", "TIBC", "IRON", "RETICCTPCT" in the last 72 hours. Sepsis Labs: No results for input(s): "PROCALCITON", "LATICACIDVEN" in the last 168 hours.  Recent Results (from the past 240 hours)  Culture, blood (Routine X 2) w Reflex to ID Panel     Status: Abnormal (Preliminary result)   Collection Time: 05/26/23  8:01 PM   Specimen: BLOOD  Result Value  Ref Range Status   Specimen Description BLOOD LEFT ANTECUBITAL  Final   Special Requests   Final    BOTTLES DRAWN AEROBIC AND ANAEROBIC Blood Culture adequate volume   Culture  Setup Time   Final    GRAM POSITIVE COCCI IN CHAINS IN BOTH AEROBIC AND ANAEROBIC BOTTLES CRITICAL RESULT CALLED TO, READ BACK BY AND VERIFIED WITH: PHARMD Dollene Cleveland @1646  FH    Culture (A)  Final    ENTEROCOCCUS FAECALIS CULTURE REINCUBATED FOR BETTER GROWTH STAPHYLOCOCCUS EPIDERMIDIS SUSCEPTIBILITIES TO FOLLOW Performed at [facility], [address], [place], [place] [address]    Report Status PENDING  Incomplete  Blood Culture ID Panel (Reflexed)     Status: Abnormal   Collection Time: 05/26/23  8:01 PM  Result Value Ref Range Status   Enterococcus faecalis DETECTED (A) NOT DETECTED Final    Comment: CRITICAL RESULT CALLED TO, READ BACK BY AND VERIFIED WITH: PHARMD Dollene Cleveland @1646  FH    Enterococcus Faecium NOT DETECTED NOT DETECTED Final   Listeria monocytogenes NOT DETECTED NOT DETECTED Final   Staphylococcus species DETECTED (A) NOT DETECTED Final    Comment: CRITICAL RESULT CALLED TO, READ BACK BY AND VERIFIED WITH: PHARMD Dollene Cleveland @1646  FH    Staphylococcus aureus (BCID) NOT DETECTED NOT DETECTED Final   Staphylococcus epidermidis DETECTED (A) NOT DETECTED Final    Comment: Methicillin (oxacillin) resistant coagulase negative staphylococcus. Possible blood culture contaminant (unless isolated from more than one blood culture draw or clinical case suggests pathogenicity). No antibiotic treatment is  indicated for blood  culture contaminants. CRITICAL RESULT CALLED TO, READ BACK BY AND VERIFIED WITH: PHARMD Dollene Cleveland @1646  FH    Staphylococcus lugdunensis DETECTED (A) NOT DETECTED Final    Comment: Methicillin (oxacillin) resistant coagulase negative staphylococcus. Possible blood culture contaminant (unless isolated from more than one blood culture draw or clinical case suggests pathogenicity). No antibiotic treatment is indicated for blood  culture contaminants. CRITICAL RESULT CALLED TO, READ BACK BY AND VERIFIED WITH: PHARMD Dollene Cleveland @1646  FH    Streptococcus species NOT DETECTED NOT DETECTED Final   Streptococcus agalactiae NOT DETECTED NOT DETECTED Final   Streptococcus pneumoniae NOT DETECTED NOT DETECTED Final   Streptococcus pyogenes NOT DETECTED NOT DETECTED Final   A.calcoaceticus-baumannii NOT DETECTED NOT DETECTED Final   Bacteroides fragilis NOT DETECTED NOT DETECTED Final   Enterobacterales NOT DETECTED NOT DETECTED Final   Enterobacter cloacae complex NOT DETECTED NOT DETECTED Final   Escherichia coli NOT DETECTED NOT DETECTED Final   Klebsiella aerogenes NOT DETECTED NOT DETECTED Final   Klebsiella oxytoca NOT DETECTED NOT DETECTED Final   Klebsiella pneumoniae NOT DETECTED NOT DETECTED Final   Proteus species NOT DETECTED NOT DETECTED Final   Salmonella species NOT DETECTED NOT DETECTED Final   Serratia marcescens NOT DETECTED NOT DETECTED Final   Haemophilus influenzae NOT DETECTED NOT DETECTED Final   Neisseria meningitidis NOT DETECTED NOT DETECTED Final   Pseudomonas aeruginosa NOT DETECTED NOT DETECTED Final   Stenotrophomonas maltophilia NOT DETECTED NOT DETECTED Final   Candida albicans NOT DETECTED NOT DETECTED Final   Candida auris NOT DETECTED NOT DETECTED Final   Candida glabrata NOT DETECTED NOT DETECTED Final   Candida krusei NOT DETECTED NOT DETECTED Final   Candida parapsilosis NOT DETECTED NOT DETECTED Final   Candida  tropicalis NOT DETECTED NOT DETECTED Final   Cryptococcus neoformans/gattii NOT DETECTED NOT DETECTED Final   Methicillin resistance mecA/C DETECTED (A) NOT DETECTED Final  Comment: CRITICAL RESULT CALLED TO, READ BACK BY AND VERIFIED WITH: PHARMD Dollene Cleveland @1646  FH    Vancomycin resistance NOT DETECTED NOT DETECTED Final    Comment: Performed at [facility], [address] [place], [place] [address]  Culture, blood (Routine X 2) w Reflex to ID Panel     Status: None   Collection Time: 05/26/23  8:07 PM   Specimen: BLOOD LEFT ARM  Result Value Ref Range Status   Specimen Description BLOOD LEFT ARM  Final   Special Requests   Final    BOTTLES DRAWN AEROBIC ONLY Blood Culture results may not be optimal due to an inadequate volume of blood received in culture bottles   Culture   Final    NO GROWTH 5 DAYS Performed at [facility], [address], [place], [place] [address]    Report Status 05/31/2023 FINAL  Final  C Difficile Quick Screen w PCR reflex     Status: None   Collection Time: 05/27/23  1:39 AM   Specimen: STOOL  Result Value Ref Range Status   C Diff antigen NEGATIVE NEGATIVE Final   C Diff toxin NEGATIVE NEGATIVE Final   C Diff interpretation No C. difficile detected.  Final    Comment: Performed at [facility], [address], [place], [place] [address]  Gastrointestinal Panel by PCR , Stool     Status: None   Collection Time: 05/27/23  1:39 AM   Specimen: STOOL  Result Value Ref Range Status   Campylobacter species NOT DETECTED NOT DETECTED Final   Plesimonas shigelloides NOT DETECTED NOT DETECTED Final   Salmonella species NOT DETECTED NOT DETECTED Final   Yersinia enterocolitica NOT DETECTED NOT DETECTED Final   Vibrio species NOT DETECTED NOT DETECTED Final   Vibrio cholerae NOT DETECTED NOT DETECTED Final   Enteroaggregative E coli (EAEC) NOT DETECTED NOT DETECTED Final   Enteropathogenic E coli (EPEC) NOT DETECTED NOT DETECTED  Final   Enterotoxigenic E coli (ETEC) NOT DETECTED NOT DETECTED Final   Shiga like toxin producing E coli (STEC) NOT DETECTED NOT DETECTED Final   Shigella/Enteroinvasive E coli (EIEC) NOT DETECTED NOT DETECTED Final   Cryptosporidium NOT DETECTED NOT DETECTED Final   Cyclospora cayetanensis NOT DETECTED NOT DETECTED Final   Entamoeba histolytica NOT DETECTED NOT DETECTED Final   Giardia lamblia NOT DETECTED NOT DETECTED Final   Adenovirus F40/41 NOT DETECTED NOT DETECTED Final   Astrovirus NOT DETECTED NOT DETECTED Final   Norovirus GI/GII NOT DETECTED NOT DETECTED Final   Rotavirus A NOT DETECTED NOT DETECTED Final   Sapovirus (I, II, IV, and V) NOT DETECTED NOT DETECTED Final    Comment: Performed at [facility], [address] [place], [place] [address]  Culture, blood (Routine X 2) w Reflex to ID Panel     Status: None (Preliminary result)   Collection Time: 05/28/23  8:41 AM   Specimen: BLOOD  Result Value Ref Range Status   Specimen Description BLOOD SITE NOT SPECIFIED  Final   Special Requests   Final    BOTTLES DRAWN AEROBIC AND ANAEROBIC Blood Culture results may not be optimal due to an inadequate volume of blood received in culture bottles   Culture   Final    NO GROWTH 3 DAYS Performed at [facility], [address], [place], [place] [address]    Report Status PENDING  Incomplete  Culture, blood (Routine X 2) w Reflex to ID  Panel     Status: None (Preliminary result)   Collection Time: 05/28/23  8:41 AM   Specimen: BLOOD  Result Value Ref Range Status   Specimen Description BLOOD SITE NOT SPECIFIED  Final   Special Requests   Final    BOTTLES DRAWN AEROBIC AND ANAEROBIC Blood Culture results may not be optimal due to an inadequate volume of blood received in culture bottles   Culture   Final    NO GROWTH 3 DAYS Performed at [facility], [address] [place], [place] [address]    Report Status PENDING  Incomplete         Radiology  Studies: No results found.         LOS: 4 days   Time spent= 35 mins    Miguel Rota, MD [facility]  If 7PM-7AM, please contact night-coverage  05/31/2023, 10:47 AM

## 2023-05-31 NOTE — Plan of Care (Signed)
   Problem: Education: Goal: Knowledge of General Education information will improve Description: Including pain rating scale, medication(s)/side effects and non-pharmacologic comfort measures Outcome: Not Progressing   Problem: Health Behavior/Discharge Planning: Goal: Ability to manage health-related needs will improve Outcome: Not Progressing

## 2023-06-01 ENCOUNTER — Other Ambulatory Visit (HOSPITAL_COMMUNITY): Payer: Medicare (Managed Care)

## 2023-06-01 DIAGNOSIS — N3000 Acute cystitis without hematuria: Secondary | ICD-10-CM | POA: Diagnosis not present

## 2023-06-01 LAB — BASIC METABOLIC PANEL
Anion gap: 9 (ref 5–15)
BUN: 8 mg/dL (ref 8–23)
CO2: 25 mmol/L (ref 22–32)
Calcium: 8.4 mg/dL — ABNORMAL LOW (ref 8.9–10.3)
Chloride: 102 mmol/L (ref 98–111)
Creatinine, Ser: 0.64 mg/dL (ref 0.44–1.00)
GFR, Estimated: >60 mL/min (ref >60)
Glucose, Bld: 127 mg/dL — ABNORMAL HIGH (ref 70–99)
Potassium: 3.7 mmol/L (ref 3.5–5.1)
Sodium: 136 mmol/L (ref 135–145)

## 2023-06-01 LAB — CBC
HCT: 44 % (ref 36.0–46.0)
Hemoglobin: 14.8 g/dL (ref 12.0–15.0)
MCH: 28.5 pg (ref 26.0–34.0)
MCHC: 33.6 g/dL (ref 30.0–36.0)
MCV: 84.8 fL (ref 80.0–100.0)
Platelets: 186 10*3/uL (ref 150–400)
RBC: 5.19 MIL/uL — ABNORMAL HIGH (ref 3.87–5.11)
RDW: 13.6 % (ref 11.5–15.5)
WBC: 7 10*3/uL (ref 4.0–10.5)
nRBC: 0 % (ref 0.0–0.2)

## 2023-06-01 LAB — MAGNESIUM: Magnesium: 1.9 mg/dL (ref 1.7–2.4)

## 2023-06-01 MED ORDER — DIPHENHYDRAMINE HCL 50 MG/ML IJ SOLN
INTRAMUSCULAR | Status: AC
  Filled 2023-06-01: qty 1

## 2023-06-01 MED ORDER — TRIMETHOBENZAMIDE HCL 100 MG/ML IM SOLN
200.0000 mg | Freq: Once | INTRAMUSCULAR | Status: AC | PRN
  Administered 2023-06-05: 200 mg via INTRAMUSCULAR
  Filled 2023-06-01 (×2): qty 2

## 2023-06-01 MED ORDER — DIPHENHYDRAMINE HCL 25 MG PO CAPS: 25.0000 mg | ORAL_CAPSULE | Freq: Once | ORAL | Status: DC | PRN

## 2023-06-01 MED ORDER — DIPHENHYDRAMINE HCL 50 MG/ML IJ SOLN
12.5000 mg | Freq: Once | INTRAMUSCULAR | Status: AC | PRN
  Administered 2023-06-01: 12.5 mg via INTRAVENOUS
  Filled 2023-06-01: qty 1

## 2023-06-01 NOTE — Progress Notes (Signed)
 PROGRESS NOTE    Denise Kelly  LOV:[id number] DOB: 02/26/1941 DOA: 05/26/2023 PCP: Eloisa Northern, MD    Brief Narrative:  83 year old with history of alcohol use, anxiety, HTN comes to the ED with confusion.  Reports of weeping wound in the right lower extremity found at her living facility sitting in feces and urine.  In the ER noted to have concerns of urinary tract infection, right lower lobe consolidation.  She also reported diarrhea, reportedly has had norovirus going around in her facility.  Eventually cultures grew multiple species with some concerns of possible contaminated therefore repeat surveillance cultures ordered.  Infectious disease also consulted for their input.  In terms of her diarrhea GI panel and C. difficile studies were negative.  Echocardiogram is negative for vegetation.  TEE recommended.   Assessment & Plan:  Principal Problem:   Urinary tract infection   Acute metabolic encephalopathy Multi organism bacteremia - Multifactorial.  Cultures have been reviewed.  ID consulted.  Currently on IV vancomycin, echocardiogram shows preserved EF without evidence of vegetation.  Cardiology notified.  Plans for TEE tomorrow -Repeat cultures remain negative   Diarrhea, resolved - Norovirus cases reported at her facility.  GI panel and C. difficile is negative here.  Hypokalemia/hypocalcemia - As needed repletion    Chronic diastolic CHF, 65% - Overall appears to be compensated.  Prior echocardiogram showing EF of 65%  Chronic venous stasis lower extremity bilaterally - Recently treated 7 days of IV Rocephin about 5 weeks ago  GERD - PPI  PE, per history -Continue outpatient Eliquis  PT/OT-SNF   DVT prophylaxis:Eliquis    Code Status: Full Code Family Communication: Does not want me to call any family Plans for TEE tomorrow    Subjective: No complaints.  Declined TEE today but willing to do it tomorrow  Examination:  General exam: Appears calm and  comfortable  Respiratory system: Clear to auscultation. Respiratory effort normal. Cardiovascular system: S1 & S2 heard, RRR. No JVD, murmurs, rubs, gallops or clicks. No pedal edema. Gastrointestinal system: Abdomen is nondistended, soft and nontender. No organomegaly or masses felt. Normal bowel sounds heard. Central nervous system: Alert and oriented. No focal neurological deficits. Extremities: Symmetric 5 x 5 power. Skin: No rashes, lesions or ulcers Psychiatry: Judgement and insight appear normal. Mood & affect appropriate.                Diet Orders (From admission, onward)     Start     Ordered   06/02/23 0001  Diet NPO time specified  Diet effective midnight        06/01/23 0924   06/01/23 0924  Diet regular Room service appropriate? Yes; Fluid consistency: Thin  Diet effective now       Question Answer Comment  Room service appropriate? Yes   Fluid consistency: Thin      06/01/23 0923            Objective: Vitals:   05/31/23 0745 05/31/23 2102 06/01/23 0547 06/01/23 0838  BP: (!) 126/59 132/70 118/70 (!) 151/75  Pulse: 79 80 87 92  Resp: 18 18 17    Temp: 98 F (36.7 C) 97.7 F (36.5 C) 98 F (36.7 C) 98.5 F (36.9 C)  TempSrc:  Oral Oral Oral  SpO2: 95% 95% 93% 93%  Weight:      Height:        Intake/Output Summary (Last 24 hours) at 06/01/2023 1253 Last data filed at 06/01/2023 0500 Gross per 24 hour  Intake  350.05 ml  Output 825 ml  Net -474.95 ml   Filed Weights   05/27/23 0115  Weight: 99 kg    Scheduled Meds:  apixaban  5 mg Oral BID   diphenhydrAMINE       gabapentin  200 mg Oral TID   hydrocerin   Topical BID   pramipexole  0.25 mg Oral TID   Continuous Infusions:  vancomycin 1,750 mg (06/01/23 0830)    Nutritional status     Body mass index is 34.18 kg/m.  Data Reviewed:   CBC: Recent Labs  Lab 05/26/23 2001 05/26/23 2006 05/28/23 4098 05/29/23 0740 05/30/23 1191 05/31/23 0729 06/01/23 0819  WBC 7.2   < >  5.9 5.7 6.7 6.3 7.0  NEUTROABS 4.9  --   --   --   --   --   --   HGB 15.7*   < > 15.1* 14.1 15.4* 14.5 14.8  HCT 46.3*   < > 45.8 43.0 46.6* 43.9 44.0  MCV 84.2   < > 85.3 86.2 86.6 86.1 84.8  PLT 221   < > 162 170 169 176 186   < > = values in this interval not displayed.   Basic Metabolic Panel: Recent Labs  Lab 05/28/23 0841 05/29/23 0740 05/30/23 0638 05/31/23 0729 06/01/23 0819  NA 139 141 139 137 136  K 3.1* 3.6 4.5 3.9 3.7  CL 106 105 104 101 102  CO2 22 27 23 31 25   GLUCOSE 86 116* 91 122* 127*  BUN 7* 7* 8 12 8   CREATININE 0.48 0.61 0.81 0.85 0.64  CALCIUM 8.5* 8.3* 8.7* 8.4* 8.4*  MG 1.7 1.7 1.9 1.8 1.9  PHOS 3.2  --   --   --   --    GFR: Estimated Creatinine Clearance: 66.7 mL/min (by C-G formula based on SCr of 0.64 mg/dL). Liver Function Tests: Recent Labs  Lab 05/26/23 2001  AST 36  ALT 24  ALKPHOS 81  BILITOT 1.6*  PROT 7.1  ALBUMIN 3.5   No results for input(s): "LIPASE", "AMYLASE" in the last 168 hours. No results for input(s): "AMMONIA" in the last 168 hours. Coagulation Profile: No results for input(s): "INR", "PROTIME" in the last 168 hours. Cardiac Enzymes: No results for input(s): "CKTOTAL", "CKMB", "CKMBINDEX", "TROPONINI" in the last 168 hours. BNP (last 3 results) No results for input(s): "PROBNP" in the last 8760 hours. HbA1C: No results for input(s): "HGBA1C" in the last 72 hours. CBG: No results for input(s): "GLUCAP" in the last 168 hours. Lipid Profile: No results for input(s): "CHOL", "HDL", "LDLCALC", "TRIG", "CHOLHDL", "LDLDIRECT" in the last 72 hours. Thyroid Function Tests: No results for input(s): "TSH", "T4TOTAL", "FREET4", "T3FREE", "THYROIDAB" in the last 72 hours. Anemia Panel: No results for input(s): "VITAMINB12", "FOLATE", "FERRITIN", "TIBC", "IRON", "RETICCTPCT" in the last 72 hours. Sepsis Labs: No results for input(s): "PROCALCITON", "LATICACIDVEN" in the last 168 hours.  Recent Results (from the past 240  hours)  Culture, blood (Routine X 2) w Reflex to ID Panel     Status: Abnormal (Preliminary result)   Collection Time: 05/26/23  8:01 PM   Specimen: BLOOD  Result Value Ref Range Status   Specimen Description BLOOD LEFT ANTECUBITAL  Final   Special Requests   Final    BOTTLES DRAWN AEROBIC AND ANAEROBIC Blood Culture adequate volume   Culture  Setup Time   Final    GRAM POSITIVE COCCI IN CHAINS IN BOTH AEROBIC AND ANAEROBIC BOTTLES CRITICAL RESULT CALLED TO, READ  BACK BY AND VERIFIED WITH: PHARMD Isabel Caprice [id number] @1646  FH    Culture (A)  Final    ENTEROCOCCUS FAECALIS CULTURE REINCUBATED FOR BETTER GROWTH STAPHYLOCOCCUS EPIDERMIDIS THE SIGNIFICANCE OF ISOLATING THIS ORGANISM FROM A SINGLE SET OF BLOOD CULTURES WHEN MULTIPLE SETS ARE DRAWN IS UNCERTAIN. PLEASE NOTIFY THE MICROBIOLOGY DEPARTMENT WITHIN ONE WEEK IF SPECIATION AND SENSITIVITIES ARE REQUIRED. Performed at [facility] Lab, [address] [place], [place] [address]    Report Status PENDING  Incomplete   Organism ID, Bacteria ENTEROCOCCUS FAECALIS  Final      Susceptibility   Enterococcus faecalis - MIC*    AMPICILLIN <=2 SENSITIVE Sensitive     VANCOMYCIN 1 SENSITIVE Sensitive     GENTAMICIN SYNERGY SENSITIVE Sensitive     * ENTEROCOCCUS FAECALIS  Blood Culture ID Panel (Reflexed)     Status: Abnormal   Collection Time: 05/26/23  8:01 PM  Result Value Ref Range Status   Enterococcus faecalis DETECTED (A) NOT DETECTED Final    Comment: CRITICAL RESULT CALLED TO, READ BACK BY AND VERIFIED WITH: PHARMD Dollene Cleveland @1646  FH    Enterococcus Faecium NOT DETECTED NOT DETECTED Final   Listeria monocytogenes NOT DETECTED NOT DETECTED Final   Staphylococcus species DETECTED (A) NOT DETECTED Final    Comment: CRITICAL RESULT CALLED TO, READ BACK BY AND VERIFIED WITH: PHARMD Dollene Cleveland @1646  FH    Staphylococcus aureus (BCID) NOT DETECTED NOT DETECTED Final   Staphylococcus epidermidis DETECTED (A) NOT  DETECTED Final    Comment: Methicillin (oxacillin) resistant coagulase negative staphylococcus. Possible blood culture contaminant (unless isolated from more than one blood culture draw or clinical case suggests pathogenicity). No antibiotic treatment is indicated for blood  culture contaminants. CRITICAL RESULT CALLED TO, READ BACK BY AND VERIFIED WITH: PHARMD Dollene Cleveland @1646  FH    Staphylococcus lugdunensis DETECTED (A) NOT DETECTED Final    Comment: Methicillin (oxacillin) resistant coagulase negative staphylococcus. Possible blood culture contaminant (unless isolated from more than one blood culture draw or clinical case suggests pathogenicity). No antibiotic treatment is indicated for blood  culture contaminants. CRITICAL RESULT CALLED TO, READ BACK BY AND VERIFIED WITH: PHARMD Dollene Cleveland @1646  FH    Streptococcus species NOT DETECTED NOT DETECTED Final   Streptococcus agalactiae NOT DETECTED NOT DETECTED Final   Streptococcus pneumoniae NOT DETECTED NOT DETECTED Final   Streptococcus pyogenes NOT DETECTED NOT DETECTED Final   A.calcoaceticus-baumannii NOT DETECTED NOT DETECTED Final   Bacteroides fragilis NOT DETECTED NOT DETECTED Final   Enterobacterales NOT DETECTED NOT DETECTED Final   Enterobacter cloacae complex NOT DETECTED NOT DETECTED Final   Escherichia coli NOT DETECTED NOT DETECTED Final   Klebsiella aerogenes NOT DETECTED NOT DETECTED Final   Klebsiella oxytoca NOT DETECTED NOT DETECTED Final   Klebsiella pneumoniae NOT DETECTED NOT DETECTED Final   Proteus species NOT DETECTED NOT DETECTED Final   Salmonella species NOT DETECTED NOT DETECTED Final   Serratia marcescens NOT DETECTED NOT DETECTED Final   Haemophilus influenzae NOT DETECTED NOT DETECTED Final   Neisseria meningitidis NOT DETECTED NOT DETECTED Final   Pseudomonas aeruginosa NOT DETECTED NOT DETECTED Final   Stenotrophomonas maltophilia NOT DETECTED NOT DETECTED Final   Candida albicans  NOT DETECTED NOT DETECTED Final   Candida auris NOT DETECTED NOT DETECTED Final   Candida glabrata NOT DETECTED NOT DETECTED Final   Candida krusei NOT DETECTED NOT DETECTED Final   Candida parapsilosis NOT DETECTED NOT DETECTED Final   Candida tropicalis NOT  DETECTED NOT DETECTED Final   Cryptococcus neoformans/gattii NOT DETECTED NOT DETECTED Final   Methicillin resistance mecA/C DETECTED (A) NOT DETECTED Final    Comment: CRITICAL RESULT CALLED TO, READ BACK BY AND VERIFIED WITH: PHARMD Dollene Cleveland @1646  FH    Vancomycin resistance NOT DETECTED NOT DETECTED Final    Comment: Performed at [facility], [address], [place], [place] [address]  Culture, blood (Routine X 2) w Reflex to ID Panel     Status: None   Collection Time: 05/26/23  8:07 PM   Specimen: BLOOD LEFT ARM  Result Value Ref Range Status   Specimen Description BLOOD LEFT ARM  Final   Special Requests   Final    BOTTLES DRAWN AEROBIC ONLY Blood Culture results may not be optimal due to an inadequate volume of blood received in culture bottles   Culture   Final    NO GROWTH 5 DAYS Performed at [facility], [address] [place], [place] [address]    Report Status 05/31/2023 FINAL  Final  C Difficile Quick Screen w PCR reflex     Status: None   Collection Time: 05/27/23  1:39 AM   Specimen: STOOL  Result Value Ref Range Status   C Diff antigen NEGATIVE NEGATIVE Final   C Diff toxin NEGATIVE NEGATIVE Final   C Diff interpretation No C. difficile detected.  Final    Comment: Performed at [facility], [address] [place], [place] [address]  Gastrointestinal Panel by PCR , Stool     Status: None   Collection Time: 05/27/23  1:39 AM   Specimen: STOOL  Result Value Ref Range Status   Campylobacter species NOT DETECTED NOT DETECTED Final   Plesimonas shigelloides NOT DETECTED NOT DETECTED Final   Salmonella species NOT DETECTED NOT DETECTED Final   Yersinia enterocolitica NOT DETECTED  NOT DETECTED Final   Vibrio species NOT DETECTED NOT DETECTED Final   Vibrio cholerae NOT DETECTED NOT DETECTED Final   Enteroaggregative E coli (EAEC) NOT DETECTED NOT DETECTED Final   Enteropathogenic E coli (EPEC) NOT DETECTED NOT DETECTED Final   Enterotoxigenic E coli (ETEC) NOT DETECTED NOT DETECTED Final   Shiga like toxin producing E coli (STEC) NOT DETECTED NOT DETECTED Final   Shigella/Enteroinvasive E coli (EIEC) NOT DETECTED NOT DETECTED Final   Cryptosporidium NOT DETECTED NOT DETECTED Final   Cyclospora cayetanensis NOT DETECTED NOT DETECTED Final   Entamoeba histolytica NOT DETECTED NOT DETECTED Final   Giardia lamblia NOT DETECTED NOT DETECTED Final   Adenovirus F40/41 NOT DETECTED NOT DETECTED Final   Astrovirus NOT DETECTED NOT DETECTED Final   Norovirus GI/GII NOT DETECTED NOT DETECTED Final   Rotavirus A NOT DETECTED NOT DETECTED Final   Sapovirus (I, II, IV, and V) NOT DETECTED NOT DETECTED Final    Comment: Performed at [facility], [address] [place], [place] [address]  Culture, blood (Routine X 2) w Reflex to ID Panel     Status: None (Preliminary result)   Collection Time: 05/28/23  8:41 AM   Specimen: BLOOD  Result Value Ref Range Status   Specimen Description BLOOD SITE NOT SPECIFIED  Final   Special Requests   Final    BOTTLES DRAWN AEROBIC AND ANAEROBIC Blood Culture results may not be optimal due to an inadequate volume of blood received in culture bottles   Culture   Final    NO GROWTH 4 DAYS Performed at [facility]  Lab, [address] [place], [place] [address]    Report Status PENDING  Incomplete  Culture, blood (Routine X 2) w Reflex to ID Panel     Status: None (Preliminary result)   Collection Time: 05/28/23  8:41 AM   Specimen: BLOOD  Result Value Ref Range Status   Specimen Description BLOOD SITE NOT SPECIFIED  Final   Special Requests   Final    BOTTLES DRAWN AEROBIC AND ANAEROBIC Blood Culture results may not be optimal  due to an inadequate volume of blood received in culture bottles   Culture   Final    NO GROWTH 4 DAYS Performed at [facility] Lab, [address], [place], [place] [address]    Report Status PENDING  Incomplete         Radiology Studies: No results found.         LOS: 5 days   Time spent= 35 mins    Miguel Rota, MD [facility]  If 7PM-7AM, please contact night-coverage  06/01/2023, 12:53 PM

## 2023-06-01 NOTE — TOC Progression Note (Signed)
 Transition of Care [facility]) - Progression Note    Patient Details  Name: Denise Kelly MRN: [id number] Date of Birth: Sep 08, 1940  Transition of Care [facility]) CM/SW Contact  Aashi Derrington A [place], LCSW Phone Number: 06/01/2023, 4:05 PM  Clinical Narrative:     CSW reached out to [name] at [facility], who is working with pt at [facility].   She stated that she spoke with [facility], said that they are not able to have pt at facility at this time and declined bed offer.   She stated that she would meet with pt at bedside and discuss plans for possible palliative or hospice care with pt.   Stated that pt's HCPOA has been challenging to get a hold of as well so making longer term plans difficult.   She confirmed that pt can return to [facility] upon discharging from short term rehab.   Additionally, CSW sent referral for Medicaid application, confirmed that [employer] to reach out to pt to start process.    TOC will continue to follow.    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                                       Social Determinants of Health (SDOH) Interventions SDOH Screenings   Food Insecurity: No Food Insecurity (05/27/2023)  Housing: Low Risk  (05/27/2023)  Transportation Needs: No Transportation Needs (05/27/2023)  Utilities: Not At Risk (05/27/2023)  Depression (PHQ2-9): Low Risk  (01/09/2019)  Social Connections: Socially Isolated (05/27/2023)  Tobacco Use: Medium Risk (05/26/2023)    Readmission Risk Interventions    04/08/2023    1:02 PM 04/06/2023    3:38 PM 05/02/2022   11:19 AM  Readmission Risk Prevention Plan  Transportation Screening Complete Complete Complete  PCP or Specialist Appt within  3-5 Days Complete Complete   HRI or Home Care Consult Complete Complete   Social Work Consult for Recovery Care Planning/Counseling Complete Complete   Palliative Care Screening Complete Not Applicable   Medication Review Oceanographer) Complete Complete Complete  PCP or Specialist appointment within 3-5 days of discharge   Complete  HRI or Home Care Consult   Complete  SW Recovery Care/Counseling Consult   Complete  Palliative Care Screening   Not Applicable  Skilled Nursing Facility   Complete

## 2023-06-01 NOTE — Progress Notes (Signed)
 Physical Therapy Treatment Patient Details Name: Denise Kelly MRN: [id number] DOB: [birth year] Today's Date: 06/01/2023   History of Present Illness 83 y.o. presents to [facility] 05/26/23 due to weeping wound in the RLE found at living facility sitting in feces and urine. Urinalysis concerning for infection and CTA with signs of consolidation of R lower lobe.  Recent admit 2/4, 1/17 and 1/22 for UTI and BLE cellulitis. PMHx: alcohol abuse, anxiety/depression, GERD, HTN, HOH, obesity, chronic diastolic CHF, PE    PT Comments  Pt required min assist rolling R/L, and mod assist supine <> sit. She demo fair sitting balance EOB. Pt declining OOB. Requesting return to supine after sitting approx 5 minutes. Pt overall disgruntled. She is currently NPO awaiting TEE. Pt stating she does not want the procedure and wants to eat breakfast. RN aware. Current POC remains appropriate. PT will continue efforts to mobilize.     If plan is discharge home, recommend the following: A lot of help with walking and/or transfers;Assistance with cooking/housework;Assist for transportation;Supervision due to cognitive status   Can travel by private vehicle     No  Equipment Recommendations  Wheelchair cushion (measurements PT);Wheelchair (measurements PT);Hoyer lift;Hospital bed    Recommendations for Other Services       Precautions / Restrictions Precautions Precautions: Fall Recall of Precautions/Restrictions: Impaired     Mobility  Bed Mobility Overal bed mobility: Needs Assistance Bed Mobility: Rolling, Supine to Sit, Sit to Supine Rolling: Min assist, Used rails   Supine to sit: Mod assist, HOB elevated, Used rails Sit to supine: Mod assist, Used rails   General bed mobility comments: increased time, assist with BLE and trunk, use of bed pad to scoot to EOB    Transfers                   General transfer comment: Pt refused.    Ambulation/Gait                   Stairs              Wheelchair Mobility     Tilt Bed    Modified Rankin (Stroke Patients Only)       Balance Overall balance assessment: Needs assistance Sitting-balance support: Feet supported, Single extremity supported Sitting balance-Leahy Scale: Fair Sitting balance - Comments: increased fear of falling in sitting. Pt declining dizziness, vertigo.                                    Communication Communication Communication: Impaired Factors Affecting Communication: Hearing impaired  Cognition Arousal: Alert Behavior During Therapy: Agitated, Anxious   PT - Cognitive impairments: No family/caregiver present to determine baseline                         Following commands: Intact      Cueing Cueing Techniques: Verbal cues  Exercises      General Comments General comments (skin integrity, edema, etc.): Dressing in place R distal LE. Pt NPO awaiting TEE. Pt adamantly stating she does not want the test and wants to eat breakfast. RN aware.      Pertinent Vitals/Pain Pain Assessment Pain Assessment: Faces Faces Pain Scale: Hurts a little bit Pain Location: generalized with mobility Pain Descriptors / Indicators: Discomfort, Grimacing Pain Intervention(s): Limited activity within patient's tolerance, Monitored during session, Repositioned    Home Living  Prior Function            PT Goals (current goals can now be found in the care plan section) Acute Rehab PT Goals Patient Stated Goal: independence, return to apartment Progress towards PT goals: Not progressing toward goals - comment (refusing OOB)    Frequency    Min 1X/week      PT Plan      Co-evaluation              AM-PAC PT "6 Clicks" Mobility   Outcome Measure  Help needed turning from your back to your side while in a flat bed without using bedrails?: A Little Help needed moving from lying on your back to sitting on the side of a  flat bed without using bedrails?: A Lot Help needed moving to and from a bed to a chair (including a wheelchair)?: Total Help needed standing up from a chair using your arms (e.g., wheelchair or bedside chair)?: Total Help needed to walk in hospital room?: Total Help needed climbing 3-5 steps with a railing? : Total 6 Click Score: 9    End of Session   Activity Tolerance: Treatment limited secondary to agitation;Patient tolerated treatment well Patient left: in bed;with call bell/phone within reach Nurse Communication: Mobility status;Other (comment) (Pt stating she does not want TEE.) PT Visit Diagnosis: Other abnormalities of gait and mobility (R26.89);Muscle weakness (generalized) (M62.81)     Time: 5427-0623 PT Time Calculation (min) (ACUTE ONLY): 24 min  Charges:    $Therapeutic Activity: 23-37 mins PT General Charges $$ ACUTE PT VISIT: 1 Visit                     Ferd Glassing., PT  Office # [phone]    Ilda Foil 06/01/2023, 8:28 AM

## 2023-06-01 NOTE — Care Management Important Message (Signed)
 Important Message  Patient Details  Name: Denise Kelly MRN: [id number] Date of Birth: 08-28-1940   Important Message Given:  Yes - Medicare IM     Dorena Bodo 06/01/2023, 2:16 PM

## 2023-06-01 NOTE — TOC Progression Note (Signed)
 Transition of Care [facility]) - Progression Note    Patient Details  Name: Denise Kelly MRN: [id number] Date of Birth: 05/13/1940  Transition of Care [facility]) CM/SW Contact  Joshoa Shawler A [place], LCSW Phone Number: 06/01/2023, 10:29 AM  Clinical Narrative:     CSW met with pt at bedside to provide bed offers with Medicare.gov ratings. They declined all bed offers because they were not [facility]. CSW reached out to [facility] to inquire about possible bed offer, waiting to hear back. Pt stated that's the only facility they want to go to for rehab. Discussed options for home health if possible if [facility] isn't available.   TOC will continue to follow.    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                                       Social Determinants of Health (SDOH) Interventions SDOH Screenings   Food Insecurity: No Food Insecurity (05/27/2023)  Housing: Low Risk  (05/27/2023)  Transportation Needs: No Transportation Needs (05/27/2023)  Utilities: Not At Risk (05/27/2023)  Depression (PHQ2-9): Low Risk  (01/09/2019)  Social Connections: Socially Isolated (05/27/2023)  Tobacco Use: Medium Risk (05/26/2023)    Readmission Risk Interventions    04/08/2023    1:02 PM 04/06/2023    3:38 PM 05/02/2022   11:19 AM  Readmission Risk Prevention Plan  Transportation Screening Complete Complete Complete  PCP or Specialist Appt within 3-5 Days Complete Complete   HRI or Home Care Consult Complete Complete   Social Work Consult for Recovery Care Planning/Counseling Complete Complete   Palliative Care Screening Complete Not Applicable   Medication Review Oceanographer) Complete Complete Complete  PCP or Specialist appointment  within 3-5 days of discharge   Complete  HRI or Home Care Consult   Complete  SW Recovery Care/Counseling Consult   Complete  Palliative Care Screening   Not Applicable  Skilled Nursing Facility   Complete

## 2023-06-01 NOTE — Progress Notes (Signed)
 Pt requesting Benadryl for itching. Pt is currently NPO, therefore MD ordered med to be given via PIV. Attempted to flush pt PIV and pt stated that it hurts. This RN informed pt that if an IV hurts, it needs to be removed and replaced. Pt declined for it to be removed or replaced at this time. Informed pt that IV team would be the one's placing the PIV. Pt does not want IV team coming at this hour. Will attempt replacement IV prior to end of shift d/t pending procedure.

## 2023-06-01 NOTE — Plan of Care (Signed)
  Problem: Clinical Measurements: Goal: Ability to maintain clinical measurements within normal limits will improve Outcome: Not Progressing   Problem: Activity: Goal: Risk for activity intolerance will decrease Outcome: Not Progressing   Problem: Coping: Goal: Level of anxiety will decrease Outcome: Not Progressing

## 2023-06-01 NOTE — H&P (View-Only) (Signed)
 PROGRESS NOTE    Denise Kelly  LOV:[id number] DOB: 02/26/1941 DOA: 05/26/2023 PCP: Eloisa Northern, MD    Brief Narrative:  83 year old with history of alcohol use, anxiety, HTN comes to the ED with confusion.  Reports of weeping wound in the right lower extremity found at her living facility sitting in feces and urine.  In the ER noted to have concerns of urinary tract infection, right lower lobe consolidation.  She also reported diarrhea, reportedly has had norovirus going around in her facility.  Eventually cultures grew multiple species with some concerns of possible contaminated therefore repeat surveillance cultures ordered.  Infectious disease also consulted for their input.  In terms of her diarrhea GI panel and C. difficile studies were negative.  Echocardiogram is negative for vegetation.  TEE recommended.   Assessment & Plan:  Principal Problem:   Urinary tract infection   Acute metabolic encephalopathy Multi organism bacteremia - Multifactorial.  Cultures have been reviewed.  ID consulted.  Currently on IV vancomycin, echocardiogram shows preserved EF without evidence of vegetation.  Cardiology notified.  Plans for TEE tomorrow -Repeat cultures remain negative   Diarrhea, resolved - Norovirus cases reported at her facility.  GI panel and C. difficile is negative here.  Hypokalemia/hypocalcemia - As needed repletion    Chronic diastolic CHF, 65% - Overall appears to be compensated.  Prior echocardiogram showing EF of 65%  Chronic venous stasis lower extremity bilaterally - Recently treated 7 days of IV Rocephin about 5 weeks ago  GERD - PPI  PE, per history -Continue outpatient Eliquis  PT/OT-SNF   DVT prophylaxis:Eliquis    Code Status: Full Code Family Communication: Does not want me to call any family Plans for TEE tomorrow    Subjective: No complaints.  Declined TEE today but willing to do it tomorrow  Examination:  General exam: Appears calm and  comfortable  Respiratory system: Clear to auscultation. Respiratory effort normal. Cardiovascular system: S1 & S2 heard, RRR. No JVD, murmurs, rubs, gallops or clicks. No pedal edema. Gastrointestinal system: Abdomen is nondistended, soft and nontender. No organomegaly or masses felt. Normal bowel sounds heard. Central nervous system: Alert and oriented. No focal neurological deficits. Extremities: Symmetric 5 x 5 power. Skin: No rashes, lesions or ulcers Psychiatry: Judgement and insight appear normal. Mood & affect appropriate.                Diet Orders (From admission, onward)     Start     Ordered   06/02/23 0001  Diet NPO time specified  Diet effective midnight        06/01/23 0924   06/01/23 0924  Diet regular Room service appropriate? Yes; Fluid consistency: Thin  Diet effective now       Question Answer Comment  Room service appropriate? Yes   Fluid consistency: Thin      06/01/23 0923            Objective: Vitals:   05/31/23 0745 05/31/23 2102 06/01/23 0547 06/01/23 0838  BP: (!) 126/59 132/70 118/70 (!) 151/75  Pulse: 79 80 87 92  Resp: 18 18 17    Temp: 98 F (36.7 C) 97.7 F (36.5 C) 98 F (36.7 C) 98.5 F (36.9 C)  TempSrc:  Oral Oral Oral  SpO2: 95% 95% 93% 93%  Weight:      Height:        Intake/Output Summary (Last 24 hours) at 06/01/2023 1253 Last data filed at 06/01/2023 0500 Gross per 24 hour  Intake  350.05 ml  Output 825 ml  Net -474.95 ml   Filed Weights   05/27/23 0115  Weight: 99 kg    Scheduled Meds:  apixaban  5 mg Oral BID   diphenhydrAMINE       gabapentin  200 mg Oral TID   hydrocerin   Topical BID   pramipexole  0.25 mg Oral TID   Continuous Infusions:  vancomycin 1,750 mg (06/01/23 0830)    Nutritional status     Body mass index is 34.18 kg/m.  Data Reviewed:   CBC: Recent Labs  Lab 05/26/23 2001 05/26/23 2006 05/28/23 4098 05/29/23 0740 05/30/23 1191 05/31/23 0729 06/01/23 0819  WBC 7.2   < >  5.9 5.7 6.7 6.3 7.0  NEUTROABS 4.9  --   --   --   --   --   --   HGB 15.7*   < > 15.1* 14.1 15.4* 14.5 14.8  HCT 46.3*   < > 45.8 43.0 46.6* 43.9 44.0  MCV 84.2   < > 85.3 86.2 86.6 86.1 84.8  PLT 221   < > 162 170 169 176 186   < > = values in this interval not displayed.   Basic Metabolic Panel: Recent Labs  Lab 05/28/23 0841 05/29/23 0740 05/30/23 0638 05/31/23 0729 06/01/23 0819  NA 139 141 139 137 136  K 3.1* 3.6 4.5 3.9 3.7  CL 106 105 104 101 102  CO2 22 27 23 31 25   GLUCOSE 86 116* 91 122* 127*  BUN 7* 7* 8 12 8   CREATININE 0.48 0.61 0.81 0.85 0.64  CALCIUM 8.5* 8.3* 8.7* 8.4* 8.4*  MG 1.7 1.7 1.9 1.8 1.9  PHOS 3.2  --   --   --   --    GFR: Estimated Creatinine Clearance: 66.7 mL/min (by C-G formula based on SCr of 0.64 mg/dL). Liver Function Tests: Recent Labs  Lab 05/26/23 2001  AST 36  ALT 24  ALKPHOS 81  BILITOT 1.6*  PROT 7.1  ALBUMIN 3.5   No results for input(s): "LIPASE", "AMYLASE" in the last 168 hours. No results for input(s): "AMMONIA" in the last 168 hours. Coagulation Profile: No results for input(s): "INR", "PROTIME" in the last 168 hours. Cardiac Enzymes: No results for input(s): "CKTOTAL", "CKMB", "CKMBINDEX", "TROPONINI" in the last 168 hours. BNP (last 3 results) No results for input(s): "PROBNP" in the last 8760 hours. HbA1C: No results for input(s): "HGBA1C" in the last 72 hours. CBG: No results for input(s): "GLUCAP" in the last 168 hours. Lipid Profile: No results for input(s): "CHOL", "HDL", "LDLCALC", "TRIG", "CHOLHDL", "LDLDIRECT" in the last 72 hours. Thyroid Function Tests: No results for input(s): "TSH", "T4TOTAL", "FREET4", "T3FREE", "THYROIDAB" in the last 72 hours. Anemia Panel: No results for input(s): "VITAMINB12", "FOLATE", "FERRITIN", "TIBC", "IRON", "RETICCTPCT" in the last 72 hours. Sepsis Labs: No results for input(s): "PROCALCITON", "LATICACIDVEN" in the last 168 hours.  Recent Results (from the past 240  hours)  Culture, blood (Routine X 2) w Reflex to ID Panel     Status: Abnormal (Preliminary result)   Collection Time: 05/26/23  8:01 PM   Specimen: BLOOD  Result Value Ref Range Status   Specimen Description BLOOD LEFT ANTECUBITAL  Final   Special Requests   Final    BOTTLES DRAWN AEROBIC AND ANAEROBIC Blood Culture adequate volume   Culture  Setup Time   Final    GRAM POSITIVE COCCI IN CHAINS IN BOTH AEROBIC AND ANAEROBIC BOTTLES CRITICAL RESULT CALLED TO, READ  BACK BY AND VERIFIED WITH: PHARMD Isabel Caprice [id number] @1646  FH    Culture (A)  Final    ENTEROCOCCUS FAECALIS CULTURE REINCUBATED FOR BETTER GROWTH STAPHYLOCOCCUS EPIDERMIDIS THE SIGNIFICANCE OF ISOLATING THIS ORGANISM FROM A SINGLE SET OF BLOOD CULTURES WHEN MULTIPLE SETS ARE DRAWN IS UNCERTAIN. PLEASE NOTIFY THE MICROBIOLOGY DEPARTMENT WITHIN ONE WEEK IF SPECIATION AND SENSITIVITIES ARE REQUIRED. Performed at [facility], [address] [place], [place] [address]    Report Status PENDING  Incomplete   Organism ID, Bacteria ENTEROCOCCUS FAECALIS  Final      Susceptibility   Enterococcus faecalis - MIC*    AMPICILLIN <=2 SENSITIVE Sensitive     VANCOMYCIN 1 SENSITIVE Sensitive     GENTAMICIN SYNERGY SENSITIVE Sensitive     * ENTEROCOCCUS FAECALIS  Blood Culture ID Panel (Reflexed)     Status: Abnormal   Collection Time: 05/26/23  8:01 PM  Result Value Ref Range Status   Enterococcus faecalis DETECTED (A) NOT DETECTED Final    Comment: CRITICAL RESULT CALLED TO, READ BACK BY AND VERIFIED WITH: PHARMD Dollene Cleveland @1646  FH    Enterococcus Faecium NOT DETECTED NOT DETECTED Final   Listeria monocytogenes NOT DETECTED NOT DETECTED Final   Staphylococcus species DETECTED (A) NOT DETECTED Final    Comment: CRITICAL RESULT CALLED TO, READ BACK BY AND VERIFIED WITH: PHARMD Dollene Cleveland @1646  FH    Staphylococcus aureus (BCID) NOT DETECTED NOT DETECTED Final   Staphylococcus epidermidis DETECTED (A) NOT  DETECTED Final    Comment: Methicillin (oxacillin) resistant coagulase negative staphylococcus. Possible blood culture contaminant (unless isolated from more than one blood culture draw or clinical case suggests pathogenicity). No antibiotic treatment is indicated for blood  culture contaminants. CRITICAL RESULT CALLED TO, READ BACK BY AND VERIFIED WITH: PHARMD Dollene Cleveland @1646  FH    Staphylococcus lugdunensis DETECTED (A) NOT DETECTED Final    Comment: Methicillin (oxacillin) resistant coagulase negative staphylococcus. Possible blood culture contaminant (unless isolated from more than one blood culture draw or clinical case suggests pathogenicity). No antibiotic treatment is indicated for blood  culture contaminants. CRITICAL RESULT CALLED TO, READ BACK BY AND VERIFIED WITH: PHARMD Dollene Cleveland @1646  FH    Streptococcus species NOT DETECTED NOT DETECTED Final   Streptococcus agalactiae NOT DETECTED NOT DETECTED Final   Streptococcus pneumoniae NOT DETECTED NOT DETECTED Final   Streptococcus pyogenes NOT DETECTED NOT DETECTED Final   A.calcoaceticus-baumannii NOT DETECTED NOT DETECTED Final   Bacteroides fragilis NOT DETECTED NOT DETECTED Final   Enterobacterales NOT DETECTED NOT DETECTED Final   Enterobacter cloacae complex NOT DETECTED NOT DETECTED Final   Escherichia coli NOT DETECTED NOT DETECTED Final   Klebsiella aerogenes NOT DETECTED NOT DETECTED Final   Klebsiella oxytoca NOT DETECTED NOT DETECTED Final   Klebsiella pneumoniae NOT DETECTED NOT DETECTED Final   Proteus species NOT DETECTED NOT DETECTED Final   Salmonella species NOT DETECTED NOT DETECTED Final   Serratia marcescens NOT DETECTED NOT DETECTED Final   Haemophilus influenzae NOT DETECTED NOT DETECTED Final   Neisseria meningitidis NOT DETECTED NOT DETECTED Final   Pseudomonas aeruginosa NOT DETECTED NOT DETECTED Final   Stenotrophomonas maltophilia NOT DETECTED NOT DETECTED Final   Candida albicans  NOT DETECTED NOT DETECTED Final   Candida auris NOT DETECTED NOT DETECTED Final   Candida glabrata NOT DETECTED NOT DETECTED Final   Candida krusei NOT DETECTED NOT DETECTED Final   Candida parapsilosis NOT DETECTED NOT DETECTED Final   Candida tropicalis NOT  DETECTED NOT DETECTED Final   Cryptococcus neoformans/gattii NOT DETECTED NOT DETECTED Final   Methicillin resistance mecA/C DETECTED (A) NOT DETECTED Final    Comment: CRITICAL RESULT CALLED TO, READ BACK BY AND VERIFIED WITH: PHARMD Dollene Cleveland @1646  FH    Vancomycin resistance NOT DETECTED NOT DETECTED Final    Comment: Performed at [facility], [address], [place], [place] [address]  Culture, blood (Routine X 2) w Reflex to ID Panel     Status: None   Collection Time: 05/26/23  8:07 PM   Specimen: BLOOD LEFT ARM  Result Value Ref Range Status   Specimen Description BLOOD LEFT ARM  Final   Special Requests   Final    BOTTLES DRAWN AEROBIC ONLY Blood Culture results may not be optimal due to an inadequate volume of blood received in culture bottles   Culture   Final    NO GROWTH 5 DAYS Performed at [facility], [address] [place], [place] [address]    Report Status 05/31/2023 FINAL  Final  C Difficile Quick Screen w PCR reflex     Status: None   Collection Time: 05/27/23  1:39 AM   Specimen: STOOL  Result Value Ref Range Status   C Diff antigen NEGATIVE NEGATIVE Final   C Diff toxin NEGATIVE NEGATIVE Final   C Diff interpretation No C. difficile detected.  Final    Comment: Performed at [facility], [address] [place], [place] [address]  Gastrointestinal Panel by PCR , Stool     Status: None   Collection Time: 05/27/23  1:39 AM   Specimen: STOOL  Result Value Ref Range Status   Campylobacter species NOT DETECTED NOT DETECTED Final   Plesimonas shigelloides NOT DETECTED NOT DETECTED Final   Salmonella species NOT DETECTED NOT DETECTED Final   Yersinia enterocolitica NOT DETECTED  NOT DETECTED Final   Vibrio species NOT DETECTED NOT DETECTED Final   Vibrio cholerae NOT DETECTED NOT DETECTED Final   Enteroaggregative E coli (EAEC) NOT DETECTED NOT DETECTED Final   Enteropathogenic E coli (EPEC) NOT DETECTED NOT DETECTED Final   Enterotoxigenic E coli (ETEC) NOT DETECTED NOT DETECTED Final   Shiga like toxin producing E coli (STEC) NOT DETECTED NOT DETECTED Final   Shigella/Enteroinvasive E coli (EIEC) NOT DETECTED NOT DETECTED Final   Cryptosporidium NOT DETECTED NOT DETECTED Final   Cyclospora cayetanensis NOT DETECTED NOT DETECTED Final   Entamoeba histolytica NOT DETECTED NOT DETECTED Final   Giardia lamblia NOT DETECTED NOT DETECTED Final   Adenovirus F40/41 NOT DETECTED NOT DETECTED Final   Astrovirus NOT DETECTED NOT DETECTED Final   Norovirus GI/GII NOT DETECTED NOT DETECTED Final   Rotavirus A NOT DETECTED NOT DETECTED Final   Sapovirus (I, II, IV, and V) NOT DETECTED NOT DETECTED Final    Comment: Performed at [facility], [address] [place], [place] [address]  Culture, blood (Routine X 2) w Reflex to ID Panel     Status: None (Preliminary result)   Collection Time: 05/28/23  8:41 AM   Specimen: BLOOD  Result Value Ref Range Status   Specimen Description BLOOD SITE NOT SPECIFIED  Final   Special Requests   Final    BOTTLES DRAWN AEROBIC AND ANAEROBIC Blood Culture results may not be optimal due to an inadequate volume of blood received in culture bottles   Culture   Final    NO GROWTH 4 DAYS Performed at [facility]  Lab, [address] [place], [place] [address]    Report Status PENDING  Incomplete  Culture, blood (Routine X 2) w Reflex to ID Panel     Status: None (Preliminary result)   Collection Time: 05/28/23  8:41 AM   Specimen: BLOOD  Result Value Ref Range Status   Specimen Description BLOOD SITE NOT SPECIFIED  Final   Special Requests   Final    BOTTLES DRAWN AEROBIC AND ANAEROBIC Blood Culture results may not be optimal  due to an inadequate volume of blood received in culture bottles   Culture   Final    NO GROWTH 4 DAYS Performed at [facility] Lab, [address], [place], [place] [address]    Report Status PENDING  Incomplete         Radiology Studies: No results found.         LOS: 5 days   Time spent= 35 mins    Miguel Rota, MD [facility]  If 7PM-7AM, please contact night-coverage  06/01/2023, 12:53 PM

## 2023-06-01 NOTE — Progress Notes (Signed)
   [facility] has been requested to perform a transesophageal echocardiogram on Loma Sender for bacteremia.    The patient does NOT have any absolute or relative contraindications to a Transesophageal Echocardiogram (TEE).  The patient has: No other conditions that may impact this procedure.  After careful review of history and examination, the risks and benefits of transesophageal echocardiogram have been explained including risks of esophageal damage, perforation (1:10,000 risk), bleeding, pharyngeal hematoma as well as other potential complications associated with conscious sedation including aspiration, arrhythmia, respiratory failure and death. Alternatives to treatment were discussed, questions were answered.   Patient does not want to proceed with procedure today because she would like to eat breakfast. She thinks she will be okay having it tomorrow but would like to think about it more. Will cancel procedure for today and will tentatively reschedule for tomorrow. Our team will need to check back in tomorrow morning to make sure she is agreeable. Will make NPO at midnight.  Signed, Corrin Parker, PA-C  06/01/2023 8:42 AM

## 2023-06-02 ENCOUNTER — Encounter (HOSPITAL_COMMUNITY): Payer: Self-pay | Admitting: Cardiology

## 2023-06-02 ENCOUNTER — Encounter (HOSPITAL_COMMUNITY)
Admission: EM | Disposition: A | Discharge status: Skilled Nursing Facility | Payer: Self-pay | Source: Skilled Nursing Facility | Attending: Internal Medicine

## 2023-06-02 ENCOUNTER — Inpatient Hospital Stay (HOSPITAL_COMMUNITY): Payer: Medicare (Managed Care) | Admitting: Anesthesiology

## 2023-06-02 ENCOUNTER — Inpatient Hospital Stay (HOSPITAL_COMMUNITY): Payer: Medicare (Managed Care)

## 2023-06-02 DIAGNOSIS — I110 Hypertensive heart disease with heart failure: Secondary | ICD-10-CM

## 2023-06-02 DIAGNOSIS — R7881 Bacteremia: Secondary | ICD-10-CM | POA: Diagnosis not present

## 2023-06-02 DIAGNOSIS — I38 Endocarditis, valve unspecified: Secondary | ICD-10-CM

## 2023-06-02 DIAGNOSIS — Z87891 Personal history of nicotine dependence: Secondary | ICD-10-CM | POA: Diagnosis not present

## 2023-06-02 DIAGNOSIS — N3000 Acute cystitis without hematuria: Secondary | ICD-10-CM | POA: Diagnosis not present

## 2023-06-02 DIAGNOSIS — S81801A Unspecified open wound, right lower leg, initial encounter: Secondary | ICD-10-CM | POA: Diagnosis not present

## 2023-06-02 DIAGNOSIS — I5032 Chronic diastolic (congestive) heart failure: Secondary | ICD-10-CM

## 2023-06-02 DIAGNOSIS — I340 Nonrheumatic mitral (valve) insufficiency: Secondary | ICD-10-CM | POA: Diagnosis not present

## 2023-06-02 DIAGNOSIS — B952 Enterococcus as the cause of diseases classified elsewhere: Secondary | ICD-10-CM | POA: Diagnosis not present

## 2023-06-02 DIAGNOSIS — A4102 Sepsis due to Methicillin resistant Staphylococcus aureus: Secondary | ICD-10-CM | POA: Diagnosis not present

## 2023-06-02 HISTORY — PX: TRANSESOPHAGEAL ECHOCARDIOGRAM (CATH LAB): EP1270

## 2023-06-02 LAB — CULTURE, BLOOD (ROUTINE X 2)
Culture: NO GROWTH
Culture: NO GROWTH

## 2023-06-02 LAB — CBC
HCT: 40.9 % (ref 36.0–46.0)
Hemoglobin: 13.7 g/dL (ref 12.0–15.0)
MCH: 28.5 pg (ref 26.0–34.0)
MCHC: 33.5 g/dL (ref 30.0–36.0)
MCV: 85.2 fL (ref 80.0–100.0)
Platelets: 177 10*3/uL (ref 150–400)
RBC: 4.8 MIL/uL (ref 3.87–5.11)
RDW: 13.4 % (ref 11.5–15.5)
WBC: 6.8 10*3/uL (ref 4.0–10.5)
nRBC: 0 % (ref 0.0–0.2)

## 2023-06-02 LAB — BASIC METABOLIC PANEL
Anion gap: 8 (ref 5–15)
BUN: 8 mg/dL (ref 8–23)
CO2: 27 mmol/L (ref 22–32)
Calcium: 8.3 mg/dL — ABNORMAL LOW (ref 8.9–10.3)
Chloride: 102 mmol/L (ref 98–111)
Creatinine, Ser: 0.59 mg/dL (ref 0.44–1.00)
GFR, Estimated: >60 mL/min (ref >60)
Glucose, Bld: 106 mg/dL — ABNORMAL HIGH (ref 70–99)
Potassium: 3.6 mmol/L (ref 3.5–5.1)
Sodium: 137 mmol/L (ref 135–145)

## 2023-06-02 LAB — ECHO TEE

## 2023-06-02 LAB — MAGNESIUM: Magnesium: 1.9 mg/dL (ref 1.7–2.4)

## 2023-06-02 MED ORDER — MELATONIN 3 MG PO TABS
3.0000 mg | ORAL_TABLET | Freq: Every evening | ORAL | Status: DC | PRN
  Administered 2023-06-02 – 2023-06-08 (×6): 3 mg via ORAL
  Filled 2023-06-02 (×7): qty 1

## 2023-06-02 MED ORDER — GENTAMICIN SULFATE 40 MG/ML IJ SOLN
3.0000 mg/kg | INTRAVENOUS | Status: DC
  Administered 2023-06-02 – 2023-06-04 (×3): 230 mg via INTRAVENOUS
  Filled 2023-06-02 (×4): qty 5.75

## 2023-06-02 MED ORDER — ONDANSETRON HCL 4 MG/2ML IJ SOLN
4.0000 mg | Freq: Four times a day (QID) | INTRAMUSCULAR | Status: DC | PRN
  Administered 2023-06-02 – 2023-06-09 (×20): 4 mg via INTRAVENOUS
  Filled 2023-06-02 (×20): qty 2

## 2023-06-02 MED ORDER — LIDOCAINE 2% (20 MG/ML) 5 ML SYRINGE
INTRAMUSCULAR | Status: DC | PRN
Start: 2023-06-02 — End: 2023-06-02
  Administered 2023-06-02: 40 mg via INTRAVENOUS

## 2023-06-02 MED ORDER — SODIUM CHLORIDE 0.9 % IV SOLN: INTRAVENOUS | Status: DC

## 2023-06-02 MED ORDER — BISACODYL 5 MG PO TBEC
10.0000 mg | DELAYED_RELEASE_TABLET | Freq: Every day | ORAL | Status: AC
  Administered 2023-06-02 – 2023-06-05 (×4): 10 mg via ORAL
  Filled 2023-06-02 (×5): qty 2

## 2023-06-02 MED ORDER — PROPOFOL 10 MG/ML IV BOLUS
INTRAVENOUS | Status: DC | PRN
  Administered 2023-06-02 (×2): 20 mg via INTRAVENOUS
  Administered 2023-06-02 (×2): 10 mg via INTRAVENOUS
  Administered 2023-06-02: 20 mg via INTRAVENOUS
  Administered 2023-06-02: 10 mg via INTRAVENOUS
  Administered 2023-06-02: 30 mg via INTRAVENOUS
  Administered 2023-06-02: 50 mg via INTRAVENOUS
  Administered 2023-06-02: 10 mg via INTRAVENOUS
  Administered 2023-06-02: 20 mg via INTRAVENOUS

## 2023-06-02 MED ORDER — PHENYLEPHRINE HCL (PRESSORS) 10 MG/ML IV SOLN
INTRAVENOUS | Status: DC | PRN
  Administered 2023-06-02 (×2): 120 ug via INTRAVENOUS

## 2023-06-02 NOTE — Consult Note (Cosign Needed)
 [address]       [place] [address]             [phone]        Denise Kelly [facility] Medical Record #[id number] Date of Birth: 1940-06-25  Referring: No ref. provider found Primary Care: Eloisa Northern, MD Primary Cardiologist:None    History of Present Illness:     Denise Kelly is an 83 year old female with past medical history notable for hypertension, chronic diastolic congestive heart failure, left anterior fascicular block, degenerative joint disease status post right total knee replacement, past history of substance abuse and alcohol dependence, and past history of pulmonary embolus.  She resides in a skilled nursing facility.  Several days ago, she was brought to the emergency room for evaluation of new onset confusion.  She was discovered in the skilled nursing facility sitting in urine and feces.  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  Initial workup in the emergency room included CBC showing a white blood count of 7200 hemoglobin was 15.7 g, urine drug screen was negative, no significant abnormalities were noted on the CMP.  CTA of the chest showed consolidation of the right lower lobe but no evidence of pulmonary embolus.  She was noted at the time of admission to have an open draining wound on her right lower extremity.  Blood cultures were obtained and ultimately grew Enterococcus faecalis and multiple staph species.  She was started on empiric antibiotics.  Infectious disease was consulted and additional evaluation with echocardiography was recommended.  A transthoracic echo was obtained on 05/28/2023 but the images were not felt to be adequate to rule out cardiac valvular vegetations.  Transesophageal echocardiography was recommended and performed earlier today.  The study has not been formally read but the preliminary report shows    "Normal left ventricular systolic function,  echodense mass noted on aortic valve. Mitral valve and tricuspid valve. Measurements will be in final report. Also echodense mass noted in the RV cavity - base seems to be be attached to the septum. Mild to moderate mitral regurgitation, trivial TR."     CT surgery has been asked to evaluate Denise Kelly in regarding candidacy for surgical intervention for the multiple valvular vegetations.  Denise Kelly is currently resting in bed at the time of this encounter but awakened easily.  Mental status was appropriate.  She denied any chest pain or shortness of breath and overall is feeling better since her admission to the hospital.   Zubrod Score: At the time of surgery this patient's most appropriate activity status/level should be described as: []     0    Normal activity, no symptoms []     1    Restricted in physical strenuous activity but ambulatory, able to do out light work []     2    Ambulatory and capable of self care, unable to do work activities, up and about                 more than 50%  Of the time                            []     3    Only limited self care, in bed greater than 50% of waking hours [x]     4    Completely disabled, no self  care, confined to bed or chair []     5    Moribund  Past Medical History:  Diagnosis Date   Alcohol abuse    ETOH and xanax   Anxiety    Panic attacks    Arthritis    Cancer (HCC)    skin- basal , squamous , melonoma- right hand   Cellulitis    right leg   Complication of anesthesia 2009   "felt drunk for a week after"- AVM- both times- felt drunk   Depression    Encephalopathy    GERD (gastroesophageal reflux disease)    HOH (hard of hearing)    HOH (hard of hearing)    Hypertension    not on mediacations   Palpitations    PONV (postoperative nausea and vomiting)    Spondylolisthesis of lumbosacral region    UTI (urinary tract infection)    frequently    Past Surgical History:  Procedure Laterality Date   BIOPSY  01/24/2018    Procedure: BIOPSY;  Surgeon: Kerin Salen, MD;  Location[facility] ENDOSCOPY;  Service: Gastroenterology;;   BREAST SURGERY Right    2 breast biopsies   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   COLONOSCOPY  2011   polyps   ESOPHAGOGASTRODUODENOSCOPY (EGD) WITH PROPOFOL N/A 01/24/2018   Procedure: ESOPHAGOGASTRODUODENOSCOPY (EGD) WITH PROPOFOL;  Surgeon: Kerin Salen, MD;  Location: [facility] ENDOSCOPY;  Service: Gastroenterology;  Laterality: N/A;   EYE SURGERY Bilateral    cataracts   INTRAMEDULLARY (IM) NAIL INTERTROCHANTERIC Left 11/29/2015   Procedure: LEFT HIP   NAIL;  Surgeon: Sheral Apley, MD;  Location: [facility] OR;  Service: Orthopedics;  Laterality: Left;   JOINT REPLACEMENT Left 09/2009   knee   LUMBAR FUSION  07/17/2016   Lumbar four-five Posterior lumbar interbody fusion (N/A)   TONSILLECTOMY     TOTAL KNEE ARTHROPLASTY Right 01/02/2014   Procedure: TOTAL KNEE ARTHROPLASTY;  Surgeon: Dannielle Huh, MD;  Location: [facility] OR;  Service: Orthopedics;  Laterality: Right;   TUBAL LIGATION      Social History   Tobacco Use  Smoking Status Former   Current packs/day: 0.00   Average packs/day: 1 pack/day for 20.0 years (20.0 ttl pk-yrs)   Types: Cigarettes   Start date: 03/17/1972   Quit date: 03/17/1992   Years since quitting: 31.2  Smokeless Tobacco Never  Tobacco Comments   quit smoking many years ago .  quit 1994    Social History   Substance and Sexual Activity  Alcohol Use No   Comment: unable to get alcohol per family not since September     Allergies  Allergen Reactions   Sulfa Antibiotics Hives, Swelling and Other (See Comments)    Facial/eye swelling    Coreg [Carvedilol] Other (See Comments)    Memory loss   Motrin [Ibuprofen] Palpitations   Toprol Xl [Metoprolol Tartrate] Cough   Doxycycline Hyclate Other (See Comments)    HEARTBURN   Flovent Hfa [Fluticasone] Itching   Statins Other (See Comments)    MENTAL STATUS CHANGE    Current Facility-Administered  Medications  Medication Dose Route Frequency Provider Last Rate Last Admin   acetaminophen (TYLENOL) tablet 650 mg  650 mg Oral Q6H PRN Alan Mulder, MD   650 mg at 05/30/23 1610   Or   acetaminophen (TYLENOL) suppository 650 mg  650 mg Rectal Q6H PRN Alan Mulder, MD       apixaban Everlene Balls) tablet 5 mg  5 mg Oral BID Alan Mulder,  MD   5 mg at 06/02/23 1144   bisacodyl (DULCOLAX) EC tablet 10 mg  10 mg Oral Daily Amin, Ankit C, MD       gabapentin (NEURONTIN) capsule 200 mg  200 mg Oral TID Alan Mulder, MD   200 mg at 06/02/23 1144   gentamicin (GARAMYCIN) 230 mg in dextrose 5 % 50 mL IVPB  3 mg/kg (Adjusted) Intravenous Q24H Sinclair, Emily S, RPH       hydrALAZINE (APRESOLINE) injection 10 mg  10 mg Intravenous Q4H PRN Amin, Ankit C, MD       hydrocerin (EUCERIN) cream   Topical BID Amin, Ankit C, MD   Given at 06/01/23 2223   ipratropium-albuterol (DUONEB) 0.5-2.5 (3) MG/3ML nebulizer solution 3 mL  3 mL Nebulization Q4H PRN Amin, Ankit C, MD       metoprolol tartrate (LOPRESSOR) injection 5 mg  5 mg Intravenous Q4H PRN Amin, Ankit C, MD       ondansetron (ZOFRAN) injection 4 mg  4 mg Intravenous Q6H PRN Amin, Ankit C, MD       oxyCODONE (Oxy IR/ROXICODONE) immediate release tablet 5 mg  5 mg Oral Q4H PRN Alan Mulder, MD   5 mg at 06/02/23 0517   pramipexole (MIRAPEX) tablet 0.25 mg  0.25 mg Oral TID Amin, Ankit C, MD   0.25 mg at 06/02/23 1143   trimethobenzamide (TIGAN) injection 200 mg  200 mg Intramuscular Once PRN John Giovanni, MD       vancomycin (VANCOREADY) IVPB 1750 mg/350 mL  1,750 mg Intravenous Q24H Mosetta Anis, RPH 175 mL/hr at 06/02/23 1142 1,750 mg at 06/02/23 1142    Medications Prior to Admission  Medication Sig Dispense Refill Last Dose/Taking   acetaminophen (TYLENOL) 325 MG tablet Take 650 mg by mouth in the morning and at bedtime.   Past Week   apixaban (ELIQUIS) 5 MG TABS tablet Take 1 tablet (5 mg total) by mouth 2 (two) times daily. 60  tablet 0 Past Week   cyanocobalamin (VITAMIN B12) 1000 MCG tablet Take 1,000 mcg by mouth daily.   Past Week   furosemide (LASIX) 20 MG tablet Take 1 tablet (20 mg total) by mouth daily.   Past Week   gabapentin (NEURONTIN) 100 MG capsule Take 2 capsules (200 mg total) by mouth 3 (three) times daily. (Patient taking differently: Take 100 mg by mouth 3 (three) times daily.)   Past Week   hydrOXYzine (ATARAX) 10 MG tablet Take 10 mg by mouth 3 (three) times daily as needed for anxiety.   Taking As Needed   hydrOXYzine (ATARAX) 25 MG tablet Take 25 mg by mouth every 6 (six) hours as needed.   Past Week   ondansetron (ZOFRAN) 4 MG tablet Take 1 tablet (4 mg total) by mouth every 8 (eight) hours as needed for nausea or vomiting. 30 tablet 0 Taking As Needed   phenazopyridine (PYRIDIUM) 100 MG tablet Take 1 tablet (100 mg total) by mouth 3 (three) times daily as needed (dysurea). 10 tablet 0 Taking As Needed   potassium chloride SA (KLOR-CON M) 10 MEQ tablet Take 1 tablet (10 mEq total) by mouth daily.   Past Week   pramipexole (MIRAPEX) 1 MG tablet Take 1 tablet (1 mg total) by mouth every evening. 30 tablet 0 Past Week   traZODone (DESYREL) 100 MG tablet Take 1 tablet (100 mg total) by mouth at bedtime as needed for sleep. (Patient taking differently: Take 100 mg by mouth at bedtime.) 30  tablet 0 Past Week   cefadroxil (DURICEF) 500 MG capsule Take 500 mg by mouth 2 (two) times daily. (Patient not taking: Reported on 05/30/2023)   Not Taking   traMADol (ULTRAM) 50 MG tablet Take 1 tablet (50 mg total) by mouth every 6 (six) hours as needed for moderate pain (pain score 4-6) or severe pain (pain score 7-10). After 3 to 5 days, recommend tapering to discontinue as soon as able. (Patient not taking: Reported on 05/30/2023) 15 tablet 0 Not Taking    Family History  Problem Relation Age of Onset   Hypertension Other    Cancer Mother    Cancer Father      Review of Systems:     Cardiac Review of  Systems: Y or  [    ]= no  Chest Pain [    ]  Resting SOB [   ] Exertional SOB  [ x ]  Orthopnea [  ]   Pedal Edema [ x  ]    Palpitations [  ] Syncope  [  ]   Presyncope [   ]  General Review of Systems: [Y] = yes [  ]=no Constitional: recent weight change [  ]; anorexia [  ]; fatigue [ x ]; nausea [  ]; night sweats [  ]; fever [  ]; or chills [  ]                                                                Eye : blurred vision [  ]; diplopia [   ]; vision changes [  ];  Amaurosis fugax[  ]; Resp: cough [  ];  wheezing[  ];  hemoptysis[  ]; shortness of breath[ x ]; paroxysmal nocturnal dyspnea[  ]; dyspnea on exertion[  ]; or orthopnea[  ];  GI:  gallstones[  ], vomiting[  ];  dysphagia[  ]; melena[  ];  hematochezia [  ]; heartburn[  ];   Hx of  Colonoscopy[  ]; GU: kidney stones [  ]; hematuria[  ];   dysuria [  ];  nocturia[  ];  history of     obstruction [  ]; urinary frequency [  ]             Skin: rash, swelling[  ];, hair loss[  ];  peripheral edema[  ];  or itching[  ]; Musculosketetal: myalgias[  ];  joint swelling[  ];  joint erythema[  ];  joint pain[  ];  back pain[  ];  Heme/Lymph: bruising[  ];  bleeding[  ];  anemia[  ];  Neuro: TIA[  ];  headaches[  ];  stroke[  ];  vertigo[  ];  seizures[  ];   paresthesias[  ];  difficulty walking[  ];  Psych:depression[  ]; anxiety[x  ];  Endocrine: diabetes[  ];  thyroid dysfunction[  ];                  Physical Exam: BP 132/74 (BP Location: Left Arm)   Pulse 83   Temp (!) 97.5 F (36.4 C) (Oral)   Resp 18   Ht 5\' 7"  (1.702 m)   Wt 99 kg   SpO2 91%   BMI 34.18 kg/m  General appearance: alert, cooperative, and mild distress Head: Normocephalic, without obvious abnormality, atraumatic Neck: no adenopathy, no carotid bruit, no JVD, and supple, symmetrical, trachea midline Lymph nodes: no obvious cervical or clavicular adenopathy  Resp: Breath sounds are coarse, no rales Cardio: Normal S1, split S2.  No obvious murmur.   The rhythm is regular Extremities: Bilateral lower extremity edema 2-3+, right lower extremity is wrapped with a dry dressing.  Well-healed scar over the right knee Neurologic: Grossly normal  Diagnostic Studies & Laboratory data:      TRANSESOPHAGEAL ECHOCARDIOGRAM    NAME:  Denise Kelly                               MRN:   [id number] DOB:  1940/07/05                                  ADMIT DATE: 05/26/2023   INDICATIONS: Bacteremia    PROCEDURE:    Informed consent was obtained prior to the procedure. The risks, benefits and alternatives for the procedure were discussed and the patient comprehended these risks.  Risks include, but are not limited to, cough, sore throat, vomiting, nausea, somnolence, esophageal and stomach trauma or perforation, bleeding, low blood pressure, aspiration, pneumonia, infection, trauma to the teeth and death.     Procedural time out performed. The oropharynx was anesthetized with viscous lidocaine.   Anesthesia was administered by the anaesthesilogy teamto achieve and maintain moderate to deep conscious sedation.  The patient's heart rate, blood pressure, and oxygen saturation were monitored continuously during the procedure.   The transesophageal probe was inserted in the esophagus and stomach without difficulty and multiple views were obtained.    The patient tolerated the procedure well.   COMPLICATIONS:     There were no immediate complications.   KEY FINDINGS:   Normal left ventricular systolic function, echodense mass noted on aortic valve. Mitral valve and tricuspid valve. Measurements will be in final report. Also echodense mass noted in the RV cavity - base seems to be be attached to the septum. Mild to moderate mitral regurgitation, trivial TR.   Full report to follow. Further management per primary team.    Thomasene Ripple, DO [facility] [place]  [facility]  10:49 AM    Recent Radiology Findings:    CLINICAL DATA:  Acute  hypoxia   EXAM: CT ANGIOGRAPHY CHEST WITH CONTRAST   TECHNIQUE: Multidetector CT imaging of the chest was performed using the standard protocol during bolus administration of intravenous contrast. Multiplanar CT image reconstructions and MIPs were obtained to evaluate the vascular anatomy.   RADIATION DOSE REDUCTION: This exam was performed according to the departmental dose-optimization program which includes automated exposure control, adjustment of the mA and/or kV according to patient size and/or use of iterative reconstruction technique.   CONTRAST:  75mL OMNIPAQUE IOHEXOL 350 MG/ML SOLN   COMPARISON:  Chest x-ray 04/08/2023, CT chest 01/23/2018   FINDINGS: Cardiovascular: Motion degradation limits the examination. Allowing for this, no definite acute pulmonary embolus visualized to the segmental level. Mild atherosclerosis. Coronary vascular calcification. Normal cardiac size. No pericardial effusion   Mediastinum/Nodes: Patent trachea. No suspicious thyroid mass. No suspicious lymph nodes. Esophagus within normal limits.   Lungs/Pleura: No pleural effusion or pneumothorax. Mild chronic scarring and minimal bronchiectasis in the right upper lobe. Mild subpleural consolidation in the right lower  lobe with adjacent ground-glass density.   Upper Abdomen: No acute findings. Multiple hypodense cysts and subcentimeter hypodensities in the liver without significant change   Musculoskeletal: No acute or suspicious osseous abnormality   Review of the MIP images confirms the above findings.   IMPRESSION: 1. Motion degradation limits the examination. Allowing for this, no definite acute pulmonary embolus is seen. 2. Mild subpleural consolidation in the right lower lobe, suspected to be due to chronic scarring and sequela of remote pulmonary infarct, given appearance on 2019 comparison exams.   Aortic Atherosclerosis (ICD10-I70.0).     Electronically Signed   By: Jasmine Pang M.D.   On: 04/08/2023 17:24   I have independently reviewed the above radiologic studies and discussed with the patient   Recent Lab Findings: Lab Results  Component Value Date   WBC 6.8 06/02/2023   HGB 13.7 06/02/2023   HCT 40.9 06/02/2023   PLT 177 06/02/2023   GLUCOSE 106 (H) 06/02/2023   ALT 24 05/26/2023   AST 36 05/26/2023   NA 137 06/02/2023   K 3.6 06/02/2023   CL 102 06/02/2023   CREATININE 0.59 06/02/2023   BUN 8 06/02/2023   CO2 27 06/02/2023   TSH 1.792 04/29/2022   INR 1.1 05/20/2022   HGBA1C 5.2 05/26/2016      Assessment / Plan:      83 year old female with multiple medical problems as described above presented with confusion, positive blood cultures, and has been found on echo to have multiple small cardiac valvular vegetations along with an echodensity in the right ventricle.  Denise Kelly echo images were reviewed by Dr. Cliffton Asters and clinical presentation also reviewed.  She is not felt to be an operative candidate for valvular replacement or debridement.  Continuation of the established medical management with IV antibiotics and anticoagulation is recommended.   I  spent 20 minutes counseling the patient face to face.   Leary Roca, PA-C   06/02/2023 2:16 PM    Agree with above Small vegetations.  Pt likely would not tolerate triple valve surgery.  Continue medical management for now.  Harrell Keane Scrape

## 2023-06-02 NOTE — Progress Notes (Signed)
 OT Cancellation Note  Patient Details Name: Denise Kelly MRN: [id number] DOB: Oct 12, 1940   Cancelled Treatment:    Reason Eval/Treat Not Completed: Patient declined, no reason specified (pt eating lunch upon arrival, when asked if she would want therapy to return later, pt declines, requests therapy reattempt tomorrow. will follow up next date as schedule permits)  Carver Fila, OTD, OTR/L [facility] [phone]   Dalphine Handing 06/02/2023, 12:59 PM

## 2023-06-02 NOTE — Interval H&P Note (Signed)
 History and Physical Interval Note:  06/02/2023 10:04 AM  Denise Kelly  has presented today for surgery, with the diagnosis of bactermeia.  The various methods of treatment have been discussed with the patient and family. After consideration of risks, benefits and other options for treatment, the patient has consented to  Procedure(s): TRANSESOPHAGEAL ECHOCARDIOGRAM (N/A) as a surgical intervention.  The patient's history has been reviewed, patient examined, no change in status, stable for surgery.  I have reviewed the patient's chart and labs.  Questions were answered to the patient's satisfaction.     Tahje Borawski

## 2023-06-02 NOTE — Plan of Care (Signed)
 Patient AAOx4, HOH and forgetful at times. Irritable and verbally combative. New PIV placed to RFA. Redness to sacrum and groin, explained to patient that she should not use purewick due to redness, patient refused. SCDs attempted for a time, patient later refused. Wound to RLE, daily dressing C/D/I. Redness to LLE. Safety precautions maintained.   Problem: Activity: Goal: Risk for activity intolerance will decrease Outcome: Progressing   Problem: Nutrition: Goal: Adequate nutrition will be maintained Outcome: Progressing   Problem: Pain Managment: Goal: General experience of comfort will improve and/or be controlled Outcome: Progressing

## 2023-06-02 NOTE — Progress Notes (Signed)
 OT Cancellation Note  Patient Details Name: Denise Kelly MRN: [id number] DOB: 11-04-1940   Cancelled Treatment:    Reason Eval/Treat Not Completed: Patient at procedure or test/ unavailable (off unit for TEE)  Carver Fila, OTD, OTR/L [facility] Preferred Acute Rehab [phone]   Dalphine Handing 06/02/2023, 9:29 AM

## 2023-06-02 NOTE — CV Procedure (Signed)
    TRANSESOPHAGEAL ECHOCARDIOGRAM   NAME:  Denise Kelly    MRN: [id number] DOB:  August 29, 1940    ADMIT DATE: 05/26/2023  INDICATIONS: Bacteremia   PROCEDURE:   Informed consent was obtained prior to the procedure. The risks, benefits and alternatives for the procedure were discussed and the patient comprehended these risks.  Risks include, but are not limited to, cough, sore throat, vomiting, nausea, somnolence, esophageal and stomach trauma or perforation, bleeding, low blood pressure, aspiration, pneumonia, infection, trauma to the teeth and death.    Procedural time out performed. The oropharynx was anesthetized with viscous lidocaine.  Anesthesia was administered by the anaesthesilogy teamto achieve and maintain moderate to deep conscious sedation.  The patient's heart rate, blood pressure, and oxygen saturation were monitored continuously during the procedure.  The transesophageal probe was inserted in the esophagus and stomach without difficulty and multiple views were obtained.   The patient tolerated the procedure well.  COMPLICATIONS:    There were no immediate complications.  KEY FINDINGS:  Normal left ventricular systolic function, echodense mass noted on aortic valve. Mitral valve and tricuspid valve. Measurements will be in final report. Also echodense mass noted in the RV cavity - base seems to be be attached to the septum. Mild to moderate mitral regurgitation, trivial TR.   Full report to follow. Further management per primary team.   Thomasene Ripple, DO [facility] [place]  [facility]  10:49 AM

## 2023-06-02 NOTE — Anesthesia Preprocedure Evaluation (Signed)
 Anesthesia Evaluation  Patient identified by MRN, date of birth, ID band Patient awake    Reviewed: Allergy & Precautions, NPO status , Patient's Chart, lab work & pertinent test results  History of Anesthesia Complications (+) PONV and history of anesthetic complications  Airway Mallampati: III  TM Distance: >3 FB Neck ROM: Full    Dental  (+) Dental Advisory Given, Edentulous Upper, Edentulous Lower   Pulmonary neg shortness of breath, neg COPD, neg recent URI, former smoker, PE   breath sounds clear to auscultation       Cardiovascular hypertension, +CHF   Rhythm:Regular   1. Left ventricular ejection fraction, by estimation, is 65 to 70%. The  left ventricle has normal function. Left ventricular endocardial border  not optimally defined to evaluate regional wall motion. Left ventricular  diastolic parameters are consistent  with Grade I diastolic dysfunction (impaired relaxation).   2. Right ventricular systolic function is normal. The right ventricular  size is normal.   3. The mitral valve is normal in structure. No evidence of mitral valve  regurgitation. No evidence of mitral stenosis.   4. The aortic valve was not well visualized. Aortic valve regurgitation  is not visualized. No aortic stenosis is present.   5. The inferior vena cava is normal in size with greater than 50%  respiratory variability, suggesting right atrial pressure of 3 mmHg.     Neuro/Psych  PSYCHIATRIC DISORDERS Anxiety Depression       GI/Hepatic Neg liver ROS,GERD  ,,Lab Results      Component                Value               Date                      ALT                      24                  05/26/2023                AST                      36                  05/26/2023                ALKPHOS                  81                  05/26/2023                BILITOT                  1.6 (H)             05/26/2023              Endo/Other     Renal/GU Lab Results      Component                Value               Date                      NA  137                 06/02/2023                K                        3.6                 06/02/2023                CO2                      27                  06/02/2023                GLUCOSE                  106 (H)             06/02/2023                BUN                      8                   06/02/2023                CREATININE               0.59                06/02/2023                CALCIUM                  8.3 (L)             06/02/2023                GFRNONAA                 >60                 06/02/2023                Musculoskeletal  (+) Arthritis ,    Abdominal   Peds  Hematology negative hematology ROS (+) Lab Results      Component                Value               Date                      WBC                      6.8                 06/02/2023                HGB                      13.7                06/02/2023                HCT  40.9                06/02/2023                MCV                      85.2                06/02/2023                PLT                      177                 06/02/2023              Anesthesia Other Findings History of pulmonary embolism  Reproductive/Obstetrics                              Anesthesia Physical Anesthesia Plan  ASA: 3  Anesthesia Plan: MAC   Post-op Pain Management: Minimal or no pain anticipated   Induction:   PONV Risk Score and Plan: 3 and Propofol infusion  Airway Management Planned: Nasal Cannula, Natural Airway and Simple Face Mask  Additional Equipment: None  Intra-op Plan:   Post-operative Plan:   Informed Consent: I have reviewed the patients History and Physical, chart, labs and discussed the procedure including the risks, benefits and alternatives for the proposed anesthesia with the patient or authorized  representative who has indicated his/her understanding and acceptance.     Dental advisory given  Plan Discussed with: CRNA  Anesthesia Plan Comments:          Anesthesia Quick Evaluation

## 2023-06-02 NOTE — Progress Notes (Signed)
 CT surgery has been asked to see Denise Kelly regarding multiple small valvular vegetations. The TEE was reviewed by Dr. Cliffton Asters this afternoon and continued medical management is recommended. Formal consult note will follow.   Gaynelle Arabian, PA-C

## 2023-06-02 NOTE — Progress Notes (Signed)
 Pharmacy Antibiotic Note  Denise Kelly is a 83 y.o. female admitted on 05/26/2023 with MRSE, staph lugdunensis, and enterococcus faecalis bacteremia and endocarditis Pharmacy has been consulted for vancomycin + gentamicin dosing. TEE today positive for mass on aortic valve.   SCr stable at < 1. AdjBW = 77 kg   Plan: Vancomycin to 1750mg  IV q24h  Start Gentamicin 230 mg IV q 24 hours  Will plan to do vanc levels around tomorrow's dose  Monitor renal function    Height: 5\' 7"  (170.2 cm) Weight: 99 kg (218 lb 4.1 oz) IBW/kg (Calculated) : 61.6  Temp (24hrs), Avg:97.9 F (36.6 C), Min:97.5 F (36.4 C), Max:98.3 F (36.8 C)  Recent Labs  Lab 05/29/23 0740 05/30/23 8841 05/30/23 1948 05/30/23 2056 05/31/23 0729 06/01/23 0819 06/02/23 0526  WBC 5.7 6.7  --   --  6.3 7.0 6.8  CREATININE 0.61 0.81  --   --  0.85 0.64 0.59  VANCOPEAK  --   --  27* 17*  --   --   --     Estimated Creatinine Clearance: 66.7 mL/min (by C-G formula based on SCr of 0.59 mg/dL).    Allergies  Allergen Reactions   Sulfa Antibiotics Hives, Swelling and Other (See Comments)    Facial/eye swelling    Coreg [Carvedilol] Other (See Comments)    Memory loss   Motrin [Ibuprofen] Palpitations   Toprol Xl [Metoprolol Tartrate] Cough   Doxycycline Hyclate Other (See Comments)    HEARTBURN   Flovent Hfa [Fluticasone] Itching   Statins Other (See Comments)    MENTAL STATUS CHANGE     Thank you for involving pharmacy in the patient's care.    [name], PharmD, BCPS, BCIDP Infectious Diseases Clinical Pharmacist Phone: [phone] Please check AMION for all [facility] Pharmacy numbers 06/02/2023

## 2023-06-02 NOTE — Plan of Care (Signed)

## 2023-06-02 NOTE — Progress Notes (Addendum)
 PROGRESS NOTE    Denise Kelly  QIO:[id number] DOB: 1940-07-27 DOA: 05/26/2023 PCP: Eloisa Northern, MD    Brief Narrative:  83 year old with history of alcohol use, anxiety, HTN comes to the ED with confusion.  Reports of weeping wound in the right lower extremity found at her living facility sitting in feces and urine.  In the ER noted to have concerns of urinary tract infection, right lower lobe consolidation.  She also reported diarrhea, reportedly has had norovirus going around in her facility.  Eventually cultures grew multiple species with some concerns of possible contaminated therefore repeat surveillance cultures ordered.  Infectious disease also consulted for their input.  In terms of her diarrhea GI panel and C. difficile studies were negative.  Echocardiogram is negative for vegetation.  TEE recommended.  Eventually showed echodense mass.   Assessment & Plan:  Principal Problem:   Urinary tract infection   Acute metabolic encephalopathy Multi organism bacteremia with concerns of endocarditis - Multifactorial.  Cultures have been reviewed.  ID consulted.  Currently on IV vancomycin, echocardiogram shows preserved EF without evidence of vegetation.  But eventually TEE showed concerns of endocarditis with echodense mass noted on multiple valves. Will consult CT Sx as well -Repeat cultures remain negative  Addendum: Spoke with CT Sx= no surgical plans at this point, they will put in official note.    Diarrhea, resolved - Norovirus cases reported at her facility.  GI panel and C. difficile is negative here.  Hypokalemia/hypocalcemia - As needed repletion    Chronic diastolic CHF, 65% - Overall appears to be compensated.  Prior echocardiogram showing EF of 65%  Chronic venous stasis lower extremity bilaterally - Recently treated 7 days of IV Rocephin about 5 weeks ago  GERD - PPI  PE, per history -Continue outpatient Eliquis  PT/OT-SNF   DVT prophylaxis:Eliquis     Code Status: Full Code Family Communication: Does not want me to call any family Continue hospital stay for ongoing management for possible endocarditis    Subjective: No complaints.  Examination:  General exam: Appears calm and comfortable  Respiratory system: Clear to auscultation. Respiratory effort normal. Cardiovascular system: S1 & S2 heard, RRR. No JVD, murmurs, rubs, gallops or clicks. No pedal edema. Gastrointestinal system: Abdomen is nondistended, soft and nontender. No organomegaly or masses felt. Normal bowel sounds heard. Central nervous system: Alert and oriented. No focal neurological deficits. Extremities: Symmetric 5 x 5 power. Skin: No rashes, lesions or ulcers Psychiatry: Judgement and insight appear normal. Mood & affect appropriate.                Diet Orders (From admission, onward)     Start     Ordered   06/02/23 1130  Diet regular Room service appropriate? Yes; Fluid consistency: Thin  Diet effective now       Question Answer Comment  Room service appropriate? Yes   Fluid consistency: Thin      06/02/23 1129            Objective: Vitals:   06/02/23 1055 06/02/23 1100 06/02/23 1110 06/02/23 1220  BP: 117/70 123/75 123/82 132/74  Pulse: 76 77 76 83  Resp: 18 17 (!) 24 18  Temp:    (!) 97.5 F (36.4 C)  TempSrc:    Oral  SpO2: 94% 94% 91% 91%  Weight:      Height:        Intake/Output Summary (Last 24 hours) at 06/02/2023 1553 Last data filed at 06/02/2023 1502 Gross  per 24 hour  Intake 1388.07 ml  Output 1100 ml  Net 288.07 ml   Filed Weights   05/27/23 0115  Weight: 99 kg    Scheduled Meds:  apixaban  5 mg Oral BID   bisacodyl  10 mg Oral Daily   gabapentin  200 mg Oral TID   hydrocerin   Topical BID   pramipexole  0.25 mg Oral TID   Continuous Infusions:  gentamicin 111.5 mL/hr at 06/02/23 1502   vancomycin Stopped (06/02/23 1400)    Nutritional status     Body mass index is 34.18 kg/m.  Data Reviewed:    CBC: Recent Labs  Lab 05/26/23 2001 05/26/23 2006 05/29/23 0740 05/30/23 1610 05/31/23 0729 06/01/23 0819 06/02/23 0526  WBC 7.2   < > 5.7 6.7 6.3 7.0 6.8  NEUTROABS 4.9  --   --   --   --   --   --   HGB 15.7*   < > 14.1 15.4* 14.5 14.8 13.7  HCT 46.3*   < > 43.0 46.6* 43.9 44.0 40.9  MCV 84.2   < > 86.2 86.6 86.1 84.8 85.2  PLT 221   < > 170 169 176 186 177   < > = values in this interval not displayed.   Basic Metabolic Panel: Recent Labs  Lab 05/28/23 0841 05/29/23 0740 05/30/23 0638 05/31/23 0729 06/01/23 0819 06/02/23 0526  NA 139 141 139 137 136 137  K 3.1* 3.6 4.5 3.9 3.7 3.6  CL 106 105 104 101 102 102  CO2 22 27 23 31 25 27   GLUCOSE 86 116* 91 122* 127* 106*  BUN 7* 7* 8 12 8 8   CREATININE 0.48 0.61 0.81 0.85 0.64 0.59  CALCIUM 8.5* 8.3* 8.7* 8.4* 8.4* 8.3*  MG 1.7 1.7 1.9 1.8 1.9 1.9  PHOS 3.2  --   --   --   --   --    GFR: Estimated Creatinine Clearance: 66.7 mL/min (by C-G formula based on SCr of 0.59 mg/dL). Liver Function Tests: Recent Labs  Lab 05/26/23 2001  AST 36  ALT 24  ALKPHOS 81  BILITOT 1.6*  PROT 7.1  ALBUMIN 3.5   No results for input(s): "LIPASE", "AMYLASE" in the last 168 hours. No results for input(s): "AMMONIA" in the last 168 hours. Coagulation Profile: No results for input(s): "INR", "PROTIME" in the last 168 hours. Cardiac Enzymes: No results for input(s): "CKTOTAL", "CKMB", "CKMBINDEX", "TROPONINI" in the last 168 hours. BNP (last 3 results) No results for input(s): "PROBNP" in the last 8760 hours. HbA1C: No results for input(s): "HGBA1C" in the last 72 hours. CBG: No results for input(s): "GLUCAP" in the last 168 hours. Lipid Profile: No results for input(s): "CHOL", "HDL", "LDLCALC", "TRIG", "CHOLHDL", "LDLDIRECT" in the last 72 hours. Thyroid Function Tests: No results for input(s): "TSH", "T4TOTAL", "FREET4", "T3FREE", "THYROIDAB" in the last 72 hours. Anemia Panel: No results for input(s): "VITAMINB12",  "FOLATE", "FERRITIN", "TIBC", "IRON", "RETICCTPCT" in the last 72 hours. Sepsis Labs: No results for input(s): "PROCALCITON", "LATICACIDVEN" in the last 168 hours.  Recent Results (from the past 240 hours)  Culture, blood (Routine X 2) w Reflex to ID Panel     Status: Abnormal (Preliminary result)   Collection Time: 05/26/23  8:01 PM   Specimen: BLOOD  Result Value Ref Range Status   Specimen Description BLOOD LEFT ANTECUBITAL  Final   Special Requests   Final    BOTTLES DRAWN AEROBIC AND ANAEROBIC Blood Culture adequate volume  Culture  Setup Time   Final    GRAM POSITIVE COCCI IN CHAINS IN BOTH AEROBIC AND ANAEROBIC BOTTLES CRITICAL RESULT CALLED TO, READ BACK BY AND VERIFIED WITH: PHARMD Dollene Cleveland @1646  FH    Culture (A)  Final    ENTEROCOCCUS FAECALIS STAPHYLOCOCCUS LUGDUNENSIS STAPHYLOCOCCUS EPIDERMIDIS SUSCEPTIBILITIES TO FOLLOW Performed at [facility], [address] [place], [place] [address]    Report Status PENDING  Incomplete   Organism ID, Bacteria ENTEROCOCCUS FAECALIS  Final   Organism ID, Bacteria STAPHYLOCOCCUS EPIDERMIDIS  Final      Susceptibility   Enterococcus faecalis - MIC*    AMPICILLIN <=2 SENSITIVE Sensitive     VANCOMYCIN 1 SENSITIVE Sensitive     GENTAMICIN SYNERGY SENSITIVE Sensitive     * ENTEROCOCCUS FAECALIS   Staphylococcus epidermidis - MIC*    CIPROFLOXACIN >=8 RESISTANT Resistant     ERYTHROMYCIN <=0.25 SENSITIVE Sensitive     GENTAMICIN <=0.5 SENSITIVE Sensitive     OXACILLIN >=4 RESISTANT Resistant     TETRACYCLINE <=1 SENSITIVE Sensitive     VANCOMYCIN 1 SENSITIVE Sensitive     TRIMETH/SULFA 20 SENSITIVE Sensitive     CLINDAMYCIN <=0.25 SENSITIVE Sensitive     RIFAMPIN <=0.5 SENSITIVE Sensitive     Inducible Clindamycin NEGATIVE Sensitive     * STAPHYLOCOCCUS EPIDERMIDIS  Blood Culture ID Panel (Reflexed)     Status: Abnormal   Collection Time: 05/26/23  8:01 PM  Result Value Ref Range Status   Enterococcus  faecalis DETECTED (A) NOT DETECTED Final    Comment: CRITICAL RESULT CALLED TO, READ BACK BY AND VERIFIED WITH: PHARMD Dollene Cleveland @1646  FH    Enterococcus Faecium NOT DETECTED NOT DETECTED Final   Listeria monocytogenes NOT DETECTED NOT DETECTED Final   Staphylococcus species DETECTED (A) NOT DETECTED Final    Comment: CRITICAL RESULT CALLED TO, READ BACK BY AND VERIFIED WITH: PHARMD Dollene Cleveland @1646  FH    Staphylococcus aureus (BCID) NOT DETECTED NOT DETECTED Final   Staphylococcus epidermidis DETECTED (A) NOT DETECTED Final    Comment: Methicillin (oxacillin) resistant coagulase negative staphylococcus. Possible blood culture contaminant (unless isolated from more than one blood culture draw or clinical case suggests pathogenicity). No antibiotic treatment is indicated for blood  culture contaminants. CRITICAL RESULT CALLED TO, READ BACK BY AND VERIFIED WITH: PHARMD Dollene Cleveland @1646  FH    Staphylococcus lugdunensis DETECTED (A) NOT DETECTED Final    Comment: Methicillin (oxacillin) resistant coagulase negative staphylococcus. Possible blood culture contaminant (unless isolated from more than one blood culture draw or clinical case suggests pathogenicity). No antibiotic treatment is indicated for blood  culture contaminants. CRITICAL RESULT CALLED TO, READ BACK BY AND VERIFIED WITH: PHARMD Dollene Cleveland @1646  FH    Streptococcus species NOT DETECTED NOT DETECTED Final   Streptococcus agalactiae NOT DETECTED NOT DETECTED Final   Streptococcus pneumoniae NOT DETECTED NOT DETECTED Final   Streptococcus pyogenes NOT DETECTED NOT DETECTED Final   A.calcoaceticus-baumannii NOT DETECTED NOT DETECTED Final   Bacteroides fragilis NOT DETECTED NOT DETECTED Final   Enterobacterales NOT DETECTED NOT DETECTED Final   Enterobacter cloacae complex NOT DETECTED NOT DETECTED Final   Escherichia coli NOT DETECTED NOT DETECTED Final   Klebsiella aerogenes NOT DETECTED NOT  DETECTED Final   Klebsiella oxytoca NOT DETECTED NOT DETECTED Final   Klebsiella pneumoniae NOT DETECTED NOT DETECTED Final   Proteus species NOT DETECTED NOT DETECTED Final   Salmonella species NOT DETECTED NOT DETECTED Final  Serratia marcescens NOT DETECTED NOT DETECTED Final   Haemophilus influenzae NOT DETECTED NOT DETECTED Final   Neisseria meningitidis NOT DETECTED NOT DETECTED Final   Pseudomonas aeruginosa NOT DETECTED NOT DETECTED Final   Stenotrophomonas maltophilia NOT DETECTED NOT DETECTED Final   Candida albicans NOT DETECTED NOT DETECTED Final   Candida auris NOT DETECTED NOT DETECTED Final   Candida glabrata NOT DETECTED NOT DETECTED Final   Candida krusei NOT DETECTED NOT DETECTED Final   Candida parapsilosis NOT DETECTED NOT DETECTED Final   Candida tropicalis NOT DETECTED NOT DETECTED Final   Cryptococcus neoformans/gattii NOT DETECTED NOT DETECTED Final   Methicillin resistance mecA/C DETECTED (A) NOT DETECTED Final    Comment: CRITICAL RESULT CALLED TO, READ BACK BY AND VERIFIED WITH: PHARMD Dollene Cleveland @1646  FH    Vancomycin resistance NOT DETECTED NOT DETECTED Final    Comment: Performed at [facility], [address], [place], [place] [address]  Culture, blood (Routine X 2) w Reflex to ID Panel     Status: None   Collection Time: 05/26/23  8:07 PM   Specimen: BLOOD LEFT ARM  Result Value Ref Range Status   Specimen Description BLOOD LEFT ARM  Final   Special Requests   Final    BOTTLES DRAWN AEROBIC ONLY Blood Culture results may not be optimal due to an inadequate volume of blood received in culture bottles   Culture   Final    NO GROWTH 5 DAYS Performed at [facility], [address], [place], [place] [address]    Report Status 05/31/2023 FINAL  Final  C Difficile Quick Screen w PCR reflex     Status: None   Collection Time: 05/27/23  1:39 AM   Specimen: STOOL  Result Value Ref Range Status   C Diff antigen NEGATIVE NEGATIVE Final    C Diff toxin NEGATIVE NEGATIVE Final   C Diff interpretation No C. difficile detected.  Final    Comment: Performed at [facility], [address], [place], [place] [address]  Gastrointestinal Panel by PCR , Stool     Status: None   Collection Time: 05/27/23  1:39 AM   Specimen: STOOL  Result Value Ref Range Status   Campylobacter species NOT DETECTED NOT DETECTED Final   Plesimonas shigelloides NOT DETECTED NOT DETECTED Final   Salmonella species NOT DETECTED NOT DETECTED Final   Yersinia enterocolitica NOT DETECTED NOT DETECTED Final   Vibrio species NOT DETECTED NOT DETECTED Final   Vibrio cholerae NOT DETECTED NOT DETECTED Final   Enteroaggregative E coli (EAEC) NOT DETECTED NOT DETECTED Final   Enteropathogenic E coli (EPEC) NOT DETECTED NOT DETECTED Final   Enterotoxigenic E coli (ETEC) NOT DETECTED NOT DETECTED Final   Shiga like toxin producing E coli (STEC) NOT DETECTED NOT DETECTED Final   Shigella/Enteroinvasive E coli (EIEC) NOT DETECTED NOT DETECTED Final   Cryptosporidium NOT DETECTED NOT DETECTED Final   Cyclospora cayetanensis NOT DETECTED NOT DETECTED Final   Entamoeba histolytica NOT DETECTED NOT DETECTED Final   Giardia lamblia NOT DETECTED NOT DETECTED Final   Adenovirus F40/41 NOT DETECTED NOT DETECTED Final   Astrovirus NOT DETECTED NOT DETECTED Final   Norovirus GI/GII NOT DETECTED NOT DETECTED Final   Rotavirus A NOT DETECTED NOT DETECTED Final   Sapovirus (I, II, IV, and V) NOT DETECTED NOT DETECTED Final    Comment: Performed at [facility], [address] [place], [place] [address]  Culture, blood (Routine X  2) w Reflex to ID Panel     Status: None   Collection Time: 05/28/23  8:41 AM   Specimen: BLOOD  Result Value Ref Range Status   Specimen Description BLOOD SITE NOT SPECIFIED  Final   Special Requests   Final    BOTTLES DRAWN AEROBIC AND ANAEROBIC Blood Culture results may not be optimal due to an inadequate volume of blood  received in culture bottles   Culture   Final    NO GROWTH 5 DAYS Performed at [facility], [address], [place], [place] [address]    Report Status 06/02/2023 FINAL  Final  Culture, blood (Routine X 2) w Reflex to ID Panel     Status: None   Collection Time: 05/28/23  8:41 AM   Specimen: BLOOD  Result Value Ref Range Status   Specimen Description BLOOD SITE NOT SPECIFIED  Final   Special Requests   Final    BOTTLES DRAWN AEROBIC AND ANAEROBIC Blood Culture results may not be optimal due to an inadequate volume of blood received in culture bottles   Culture   Final    NO GROWTH 5 DAYS Performed at [facility], [address] [place], [place] [address]    Report Status 06/02/2023 FINAL  Final         Radiology Studies: EP STUDY Result Date: 06/02/2023 See surgical note for result.          LOS: 6 days   Time spent= 35 mins    Miguel Rota, MD [facility]  If 7PM-7AM, please contact night-coverage  06/02/2023, 3:53 PM

## 2023-06-02 NOTE — Plan of Care (Signed)

## 2023-06-02 NOTE — Transfer of Care (Signed)
 Immediate Anesthesia Transfer of Care Note  Patient: Denise Kelly  Procedure(s) Performed: TRANSESOPHAGEAL ECHOCARDIOGRAM  Patient Location: PACU and Cath Lab  Anesthesia Type:MAC  Level of Consciousness: awake, drowsy, patient cooperative, and responds to stimulation  Airway & Oxygen Therapy: Patient Spontanous Breathing and Patient connected to nasal cannula oxygen  Post-op Assessment: Report given to RN and Post -op Vital signs reviewed and stable  Post vital signs: Reviewed and stable  Last Vitals:  Vitals Value Taken Time  BP    Temp    Pulse    Resp    SpO2      Last Pain:  Vitals:   06/02/23 0946  TempSrc:   PainSc: 0-No pain      Patients Stated Pain Goal: 2 (06/02/23 0602)  Complications: No notable events documented.

## 2023-06-03 ENCOUNTER — Other Ambulatory Visit: Payer: Self-pay

## 2023-06-03 DIAGNOSIS — R7881 Bacteremia: Secondary | ICD-10-CM

## 2023-06-03 DIAGNOSIS — N3000 Acute cystitis without hematuria: Secondary | ICD-10-CM | POA: Diagnosis not present

## 2023-06-03 LAB — CULTURE, BLOOD (ROUTINE X 2): Special Requests: ADEQUATE

## 2023-06-03 LAB — MAGNESIUM: Magnesium: 1.8 mg/dL (ref 1.7–2.4)

## 2023-06-03 LAB — BASIC METABOLIC PANEL
Anion gap: 10 (ref 5–15)
BUN: 7 mg/dL — ABNORMAL LOW (ref 8–23)
CO2: 30 mmol/L (ref 22–32)
Calcium: 8.9 mg/dL (ref 8.9–10.3)
Chloride: 99 mmol/L (ref 98–111)
Creatinine, Ser: 0.62 mg/dL (ref 0.44–1.00)
GFR, Estimated: >60 mL/min (ref >60)
Glucose, Bld: 123 mg/dL — ABNORMAL HIGH (ref 70–99)
Potassium: 3.5 mmol/L (ref 3.5–5.1)
Sodium: 139 mmol/L (ref 135–145)

## 2023-06-03 LAB — CBC
HCT: 44.8 % (ref 36.0–46.0)
Hemoglobin: 15.1 g/dL — ABNORMAL HIGH (ref 12.0–15.0)
MCH: 28.9 pg (ref 26.0–34.0)
MCHC: 33.7 g/dL (ref 30.0–36.0)
MCV: 85.8 fL (ref 80.0–100.0)
Platelets: 212 10*3/uL (ref 150–400)
RBC: 5.22 MIL/uL — ABNORMAL HIGH (ref 3.87–5.11)
RDW: 13.5 % (ref 11.5–15.5)
WBC: 7.8 10*3/uL (ref 4.0–10.5)
nRBC: 0 % (ref 0.0–0.2)

## 2023-06-03 LAB — VANCOMYCIN, TROUGH: Vancomycin Tr: 13 ug/mL — ABNORMAL LOW (ref 15–20)

## 2023-06-03 LAB — VANCOMYCIN, PEAK: Vancomycin Pk: 39 ug/mL (ref 30–40)

## 2023-06-03 MED ORDER — SODIUM CHLORIDE 0.9% FLUSH: 10.0000 mL | INTRAVENOUS | Status: DC | PRN

## 2023-06-03 MED ORDER — CHLORHEXIDINE GLUCONATE CLOTH 2 % EX PADS
6.0000 | MEDICATED_PAD | Freq: Every day | CUTANEOUS | Status: DC
  Administered 2023-06-03 – 2023-06-09 (×7): 6 via TOPICAL

## 2023-06-03 MED ORDER — SODIUM CHLORIDE 0.9% FLUSH
10.0000 mL | Freq: Two times a day (BID) | INTRAVENOUS | Status: DC
Start: 2023-06-03 — End: 2023-06-10
  Administered 2023-06-03 – 2023-06-09 (×13): 10 mL

## 2023-06-03 MED ORDER — VANCOMYCIN HCL 1500 MG/300ML IV SOLN
1500.0000 mg | INTRAVENOUS | Status: DC
  Administered 2023-06-04 – 2023-06-10 (×7): 1500 mg via INTRAVENOUS
  Filled 2023-06-03 (×7): qty 300

## 2023-06-03 NOTE — Progress Notes (Addendum)
 PROGRESS NOTE    ALOHA BARTOK  YNW:[id number] DOB: 1940/06/22 DOA: 05/26/2023 PCP: Eloisa Northern, MD    Brief Narrative:  83 year old with history of alcohol use, anxiety, HTN comes to the ED with confusion.  Reports of weeping wound in the right lower extremity found at her living facility sitting in feces and urine.  In the ER noted to have concerns of urinary tract infection, right lower lobe consolidation.  She also reported diarrhea, reportedly has had norovirus going around in her facility.  Eventually cultures grew multiple species with some concerns of possible contaminated therefore repeat surveillance cultures ordered.  Infectious disease also consulted for their input.  In terms of her diarrhea GI panel and C. difficile studies were negative.  Echocardiogram is negative for vegetation.  TEE recommended.  Eventually showed echodense mass.   Assessment & Plan:  Principal Problem:   Urinary tract infection   Acute metabolic encephalopathy Multi organism bacteremia with concerns of endocarditis - Multifactorial.  Cultures have been reviewed.   - ID consulted - now on vancomycin/gentomycin - plan for 6 weeks of therapy; outpatient followup 4/7 with ID Dr Thedore Mins - Echo without overt dysfunction/evidence of vegetation - TEE shows questionable echodense mass on multiple valves - CT surgery contacted - not candidate for valve replacement/debridement. -Repeat cultures remain negative to date  Diarrhea, resolved - Norovirus cases reported at her facility.  GI panel and C. difficile is negative here.  Hypokalemia/hypocalcemia - As needed repletion  Chronic diastolic CHF, 65% - Overall appears to be compensated.  Prior echocardiogram showing EF of 65%  Chronic venous stasis lower extremity bilaterally - Recently treated 7 days of IV Rocephin about 5 weeks ago  GERD - PPI  PE, per history -Continue outpatient Eliquis  PT/OT- Recommending SNF   DVT prophylaxis:Eliquis    Code  Status: Full Code Family Communication: Does not want me to call any family at this time (States - "I have no one I want you to call" ) Continue hospital stay for ongoing management for possible endocarditis  Subjective: No complaints. Somewhat depressed about being alone in the hospital.  Examination:  General exam: Appears calm and comfortable  Respiratory system: Clear to auscultation. Respiratory effort normal. Cardiovascular system: S1 & S2 heard, RRR. No JVD, murmurs, rubs, gallops or clicks. No pedal edema. Gastrointestinal system: Abdomen is nondistended, soft and nontender. No organomegaly or masses felt. Normal bowel sounds heard. Central nervous system: Alert and oriented. No focal neurological deficits. Extremities: Symmetric 5 x 5 power. Skin: No rashes, lesions or ulcers Psychiatry: Judgement and insight appear normal. Mood somewhat depressed with congruent affect.        Diet Orders (From admission, onward)     Start     Ordered   06/02/23 1130  Diet regular Room service appropriate? Yes; Fluid consistency: Thin  Diet effective now       Question Answer Comment  Room service appropriate? Yes   Fluid consistency: Thin      06/02/23 1129            Objective: Vitals:   06/02/23 1728 06/02/23 2035 06/03/23 0355 06/03/23 0852  BP: 131/72 128/69 111/73 117/70  Pulse: 89 86 79 75  Resp: 18 17 18    Temp: (!) 97.4 F (36.3 C) (!) 97.4 F (36.3 C) (!) 97.5 F (36.4 C) 98.4 F (36.9 C)  TempSrc: Oral Oral Oral   SpO2: 92% 90% 92% 91%  Weight:   100.7 kg   Height:  Intake/Output Summary (Last 24 hours) at 06/03/2023 1521 Last data filed at 06/03/2023 0500 Gross per 24 hour  Intake 240 ml  Output 400 ml  Net -160 ml   Filed Weights   05/27/23 0115 06/03/23 0355  Weight: 99 kg 100.7 kg    Scheduled Meds:  apixaban  5 mg Oral BID   bisacodyl  10 mg Oral Daily   gabapentin  200 mg Oral TID   hydrocerin   Topical BID   pramipexole  0.25 mg  Oral TID   Continuous Infusions:  gentamicin 111.5 mL/hr at 06/02/23 1502   [START ON 06/04/2023] vancomycin      Nutritional status     Body mass index is 34.77 kg/m.  Data Reviewed:   CBC: Recent Labs  Lab 05/30/23 0638 05/31/23 0729 06/01/23 0819 06/02/23 0526 06/03/23 0758  WBC 6.7 6.3 7.0 6.8 7.8  HGB 15.4* 14.5 14.8 13.7 15.1*  HCT 46.6* 43.9 44.0 40.9 44.8  MCV 86.6 86.1 84.8 85.2 85.8  PLT 169 176 186 177 212   Basic Metabolic Panel: Recent Labs  Lab 05/28/23 0841 05/29/23 0740 05/30/23 3474 05/31/23 0729 06/01/23 0819 06/02/23 0526 06/03/23 0758  NA 139   < > 139 137 136 137 139  K 3.1*   < > 4.5 3.9 3.7 3.6 3.5  CL 106   < > 104 101 102 102 99  CO2 22   < > 23 31 25 27 30   GLUCOSE 86   < > 91 122* 127* 106* 123*  BUN 7*   < > 8 12 8 8  7*  CREATININE 0.48   < > 0.81 0.85 0.64 0.59 0.62  CALCIUM 8.5*   < > 8.7* 8.4* 8.4* 8.3* 8.9  MG 1.7   < > 1.9 1.8 1.9 1.9 1.8  PHOS 3.2  --   --   --   --   --   --    < > = values in this interval not displayed.   GFR: Estimated Creatinine Clearance: 67.2 mL/min (by C-G formula based on SCr of 0.62 mg/dL).  Recent Results (from the past 240 hours)  Culture, blood (Routine X 2) w Reflex to ID Panel     Status: Abnormal   Collection Time: 05/26/23  8:01 PM   Specimen: BLOOD  Result Value Ref Range Status   Specimen Description BLOOD LEFT ANTECUBITAL  Final   Special Requests   Final    BOTTLES DRAWN AEROBIC AND ANAEROBIC Blood Culture adequate volume   Culture  Setup Time   Final    GRAM POSITIVE COCCI IN CHAINS IN BOTH AEROBIC AND ANAEROBIC BOTTLES CRITICAL RESULT CALLED TO, READ BACK BY AND VERIFIED WITH: Chana Bode [id number] @1646  FH Performed at [facility], [address], [place], [place] [address]    Culture (A)  Final    ENTEROCOCCUS FAECALIS STAPHYLOCOCCUS LUGDUNENSIS STAPHYLOCOCCUS EPIDERMIDIS    Report Status 06/03/2023 FINAL  Final   Organism ID, Bacteria ENTEROCOCCUS FAECALIS   Final   Organism ID, Bacteria STAPHYLOCOCCUS EPIDERMIDIS  Final   Organism ID, Bacteria STAPHYLOCOCCUS LUGDUNENSIS  Final      Susceptibility   Enterococcus faecalis - MIC*    AMPICILLIN <=2 SENSITIVE Sensitive     VANCOMYCIN 1 SENSITIVE Sensitive     GENTAMICIN SYNERGY SENSITIVE Sensitive     * ENTEROCOCCUS FAECALIS   Staphylococcus epidermidis - MIC*    CIPROFLOXACIN >=8 RESISTANT Resistant     ERYTHROMYCIN <=0.25 SENSITIVE Sensitive  GENTAMICIN <=0.5 SENSITIVE Sensitive     OXACILLIN >=4 RESISTANT Resistant     TETRACYCLINE <=1 SENSITIVE Sensitive     VANCOMYCIN 1 SENSITIVE Sensitive     TRIMETH/SULFA 20 SENSITIVE Sensitive     CLINDAMYCIN <=0.25 SENSITIVE Sensitive     RIFAMPIN <=0.5 SENSITIVE Sensitive     Inducible Clindamycin NEGATIVE Sensitive     * STAPHYLOCOCCUS EPIDERMIDIS   Staphylococcus lugdunensis - MIC*    CIPROFLOXACIN <=0.5 SENSITIVE Sensitive     ERYTHROMYCIN <=0.25 SENSITIVE Sensitive     GENTAMICIN <=0.5 SENSITIVE Sensitive     OXACILLIN >=4 RESISTANT Resistant     TETRACYCLINE <=1 SENSITIVE Sensitive     VANCOMYCIN <=0.5 SENSITIVE Sensitive     TRIMETH/SULFA <=10 SENSITIVE Sensitive     CLINDAMYCIN <=0.25 SENSITIVE Sensitive     RIFAMPIN <=0.5 SENSITIVE Sensitive     Inducible Clindamycin NEGATIVE Sensitive     * STAPHYLOCOCCUS LUGDUNENSIS  Blood Culture ID Panel (Reflexed)     Status: Abnormal   Collection Time: 05/26/23  8:01 PM  Result Value Ref Range Status   Enterococcus faecalis DETECTED (A) NOT DETECTED Final    Comment: CRITICAL RESULT CALLED TO, READ BACK BY AND VERIFIED WITH: PHARMD Dollene Cleveland @1646  FH    Enterococcus Faecium NOT DETECTED NOT DETECTED Final   Listeria monocytogenes NOT DETECTED NOT DETECTED Final   Staphylococcus species DETECTED (A) NOT DETECTED Final    Comment: CRITICAL RESULT CALLED TO, READ BACK BY AND VERIFIED WITH: PHARMD Dollene Cleveland @1646  FH    Staphylococcus aureus (BCID) NOT DETECTED NOT  DETECTED Final   Staphylococcus epidermidis DETECTED (A) NOT DETECTED Final    Comment: Methicillin (oxacillin) resistant coagulase negative staphylococcus. Possible blood culture contaminant (unless isolated from more than one blood culture draw or clinical case suggests pathogenicity). No antibiotic treatment is indicated for blood  culture contaminants. CRITICAL RESULT CALLED TO, READ BACK BY AND VERIFIED WITH: PHARMD Dollene Cleveland @1646  FH    Staphylococcus lugdunensis DETECTED (A) NOT DETECTED Final    Comment: Methicillin (oxacillin) resistant coagulase negative staphylococcus. Possible blood culture contaminant (unless isolated from more than one blood culture draw or clinical case suggests pathogenicity). No antibiotic treatment is indicated for blood  culture contaminants. CRITICAL RESULT CALLED TO, READ BACK BY AND VERIFIED WITH: PHARMD Dollene Cleveland @1646  FH    Streptococcus species NOT DETECTED NOT DETECTED Final   Streptococcus agalactiae NOT DETECTED NOT DETECTED Final   Streptococcus pneumoniae NOT DETECTED NOT DETECTED Final   Streptococcus pyogenes NOT DETECTED NOT DETECTED Final   A.calcoaceticus-baumannii NOT DETECTED NOT DETECTED Final   Bacteroides fragilis NOT DETECTED NOT DETECTED Final   Enterobacterales NOT DETECTED NOT DETECTED Final   Enterobacter cloacae complex NOT DETECTED NOT DETECTED Final   Escherichia coli NOT DETECTED NOT DETECTED Final   Klebsiella aerogenes NOT DETECTED NOT DETECTED Final   Klebsiella oxytoca NOT DETECTED NOT DETECTED Final   Klebsiella pneumoniae NOT DETECTED NOT DETECTED Final   Proteus species NOT DETECTED NOT DETECTED Final   Salmonella species NOT DETECTED NOT DETECTED Final   Serratia marcescens NOT DETECTED NOT DETECTED Final   Haemophilus influenzae NOT DETECTED NOT DETECTED Final   Neisseria meningitidis NOT DETECTED NOT DETECTED Final   Pseudomonas aeruginosa NOT DETECTED NOT DETECTED Final   Stenotrophomonas  maltophilia NOT DETECTED NOT DETECTED Final   Candida albicans NOT DETECTED NOT DETECTED Final   Candida auris NOT DETECTED NOT DETECTED Final   Candida glabrata NOT DETECTED NOT DETECTED Final  Candida krusei NOT DETECTED NOT DETECTED Final   Candida parapsilosis NOT DETECTED NOT DETECTED Final   Candida tropicalis NOT DETECTED NOT DETECTED Final   Cryptococcus neoformans/gattii NOT DETECTED NOT DETECTED Final   Methicillin resistance mecA/C DETECTED (A) NOT DETECTED Final    Comment: CRITICAL RESULT CALLED TO, READ BACK BY AND VERIFIED WITH: PHARMD Dollene Cleveland @1646  FH    Vancomycin resistance NOT DETECTED NOT DETECTED Final    Comment: Performed at [facility], [address], [place], [place] [address]  Culture, blood (Routine X 2) w Reflex to ID Panel     Status: None   Collection Time: 05/26/23  8:07 PM   Specimen: BLOOD LEFT ARM  Result Value Ref Range Status   Specimen Description BLOOD LEFT ARM  Final   Special Requests   Final    BOTTLES DRAWN AEROBIC ONLY Blood Culture results may not be optimal due to an inadequate volume of blood received in culture bottles   Culture   Final    NO GROWTH 5 DAYS Performed at [facility], [address] [place], [place] [address]    Report Status 05/31/2023 FINAL  Final  C Difficile Quick Screen w PCR reflex     Status: None   Collection Time: 05/27/23  1:39 AM   Specimen: STOOL  Result Value Ref Range Status   C Diff antigen NEGATIVE NEGATIVE Final   C Diff toxin NEGATIVE NEGATIVE Final   C Diff interpretation No C. difficile detected.  Final    Comment: Performed at [facility], [address], [place], [place] [address]  Gastrointestinal Panel by PCR , Stool     Status: None   Collection Time: 05/27/23  1:39 AM   Specimen: STOOL  Result Value Ref Range Status   Campylobacter species NOT DETECTED NOT DETECTED Final   Plesimonas shigelloides NOT DETECTED NOT DETECTED Final   Salmonella species NOT  DETECTED NOT DETECTED Final   Yersinia enterocolitica NOT DETECTED NOT DETECTED Final   Vibrio species NOT DETECTED NOT DETECTED Final   Vibrio cholerae NOT DETECTED NOT DETECTED Final   Enteroaggregative E coli (EAEC) NOT DETECTED NOT DETECTED Final   Enteropathogenic E coli (EPEC) NOT DETECTED NOT DETECTED Final   Enterotoxigenic E coli (ETEC) NOT DETECTED NOT DETECTED Final   Shiga like toxin producing E coli (STEC) NOT DETECTED NOT DETECTED Final   Shigella/Enteroinvasive E coli (EIEC) NOT DETECTED NOT DETECTED Final   Cryptosporidium NOT DETECTED NOT DETECTED Final   Cyclospora cayetanensis NOT DETECTED NOT DETECTED Final   Entamoeba histolytica NOT DETECTED NOT DETECTED Final   Giardia lamblia NOT DETECTED NOT DETECTED Final   Adenovirus F40/41 NOT DETECTED NOT DETECTED Final   Astrovirus NOT DETECTED NOT DETECTED Final   Norovirus GI/GII NOT DETECTED NOT DETECTED Final   Rotavirus A NOT DETECTED NOT DETECTED Final   Sapovirus (I, II, IV, and V) NOT DETECTED NOT DETECTED Final    Comment: Performed at [facility], [address] [place], [place] [address]  Culture, blood (Routine X 2) w Reflex to ID Panel     Status: None   Collection Time: 05/28/23  8:41 AM   Specimen: BLOOD  Result Value Ref Range Status   Specimen Description BLOOD SITE NOT SPECIFIED  Final   Special Requests   Final    BOTTLES DRAWN AEROBIC AND ANAEROBIC Blood Culture results may not be optimal due to an inadequate volume of blood received in culture  bottles   Culture   Final    NO GROWTH 5 DAYS Performed at [facility], [address] [place], [place] [address]    Report Status 06/02/2023 FINAL  Final  Culture, blood (Routine X 2) w Reflex to ID Panel     Status: None   Collection Time: 05/28/23  8:41 AM   Specimen: BLOOD  Result Value Ref Range Status   Specimen Description BLOOD SITE NOT SPECIFIED  Final   Special Requests   Final    BOTTLES DRAWN AEROBIC AND ANAEROBIC Blood  Culture results may not be optimal due to an inadequate volume of blood received in culture bottles   Culture   Final    NO GROWTH 5 DAYS Performed at [facility], [address] [place] [address] [place], [place] [address]    Report Status 06/02/2023 FINAL  Final         Radiology Studies: [place] EKG SITE RITE Result Date: 06/03/2023 If Site Rite image not attached, placement could not be confirmed due to current cardiac rhythm.  ECHO TEE Result Date: 06/02/2023    TRANSESOPHOGEAL ECHO REPORT   Patient Name:   Denise Kelly Date of Exam: 06/02/2023 Medical Rec #:  [id number]       Height:       67.0 in Accession #:    [id number]      Weight:       218.3 lb Date of Birth:  May 19, 1940       BSA:          2.099 m Patient Age:    82 years        BP:           135/69 mmHg Patient Gender: F               HR:           83 bpm. Exam Location:  Inpatient Procedure: Transesophageal Echo, Color Doppler and Cardiac Doppler (Both            Spectral and Color Flow Doppler were utilized during procedure). Indications:     Endocarditis  History:         Patient has prior history of Echocardiogram examinations, most                  recent 05/28/2023.  Sonographer:     Harriette Bouillon RDCS Referring Phys:  [id number] [facility] Diagnosing Phys: Thomasene Ripple DO PROCEDURE: The transesophogeal probe was passed without difficulty through the esophogus of the patient. Sedation performed by different physician. The patient developed no complications during the procedure.  IMPRESSIONS  1. Left ventricular ejection fraction, by estimation, is 55 to 60%. The left ventricle has normal function.  2. There is an echodense mass on the wall of the right ventricular cavity. Right ventricular systolic function is normal. The right ventricular size is normal.  3. No left atrial/left atrial appendage thrombus was detected.  4. Small mobile echodensity in the right atrium not consistent with charateristics of normal variant right atrial structures.   5. A small pericardial effusion is present. The pericardial effusion is circumferential.  6. Echodense mass (1.13 cm x 0.528 cm) on the anterior mitral leaflet. The mitral valve is abnormal. Mild to moderate mitral valve regurgitation. No evidence of mitral stenosis.  7. The tricuspid valve is abnormal.  8. The aortic valve is tricuspid. Aortic valve regurgitation is trivial. Aortic valve sclerosis is present, with no evidence of aortic  valve stenosis. Conclusion(s)/Recommendation(s): Findings are concerning for vegetation/infective endocarditis as detailed above. Findings concerning for mitral valve vegetation. FINDINGS  Left Ventricle: Left ventricular ejection fraction, by estimation, is 55 to 60%. The left ventricle has normal function. The left ventricular internal cavity size was small. Right Ventricle: There is an echodense mass on the wall of the right ventricular cavity. The right ventricular size is normal. No increase in right ventricular wall thickness. Right ventricular systolic function is normal. Left Atrium: Left atrial size was normal in size. No left atrial/left atrial appendage thrombus was detected. Right Atrium: Small mobile echodensity in the right atrium not consistent with charateristics of normal variant right atrial structures. Right atrial size was normal in size. Pericardium: A small pericardial effusion is present. The pericardial effusion is circumferential. Mitral Valve: Echodense mass (1.13 cm x 0.528 cm) on the anterior mitral leaflet. The mitral valve is abnormal. Mild to moderate mitral valve regurgitation. No evidence of mitral valve stenosis. Tricuspid Valve: The tricuspid valve is abnormal. Tricuspid valve regurgitation is mild . No evidence of tricuspid stenosis. Aortic Valve: There is a mobile echodense mass (0.772 cm). The aortic valve is tricuspid. Aortic valve regurgitation is trivial. Aortic valve sclerosis is present, with no evidence of aortic valve stenosis. Pulmonic  Valve: The pulmonic valve was not well visualized. Pulmonic valve regurgitation is not visualized. Aorta: There is minimal (Grade I) layered plaque involving the descending aorta. Venous: The left upper pulmonary vein, right upper pulmonary vein and right lower pulmonary vein are normal. A normal flow pattern is recorded from the left upper pulmonary vein and the right upper pulmonary vein.  LEFT VENTRICLE PLAX 2D LVOT diam:     1.90 cm LVOT Area:     2.84 cm   AORTA Ao Root diam: 3.20 cm Ao Asc diam:  3.30 cm  SHUNTS Systemic Diam: 1.90 cm Kardie Tobb DO Electronically signed by Thomasene Ripple DO Signature Date/Time: 06/02/2023/6:11:28 PM    Final    EP STUDY Result Date: 06/02/2023 See surgical note for result.    LOS: 7 days   Time spent= 55 mins    Azucena Fallen, DO [facility]  If 7PM-7AM, please contact night-coverage  06/03/2023, 3:21 PM

## 2023-06-03 NOTE — Progress Notes (Addendum)
 [facility]  Date of Admission:  05/26/2023   Principal Problem:   Urinary tract infection Active Problems:   Bacteremia          Assessment: 83 year old femaleWith past medical history of alcohol abuse, anxiety/depression, panic attacks, hard of hearing presented due to confusion from skilled facility.  She has a wound on her right lower leg that has been weeping.  WBC 17K.  Patient afebrile.  Of note patient was at a facility, noted that there was a norovirus outbreak going on.  GIP negative, C. difficile negative.  Blood cultures BCID shows 1/2 set E faecalis and methicillin-resistant staph lugdunensis.  ID engaged. 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 Plan: -Add gentamicin to vancomycin -Place PICC -Plan on 6 weeks of abx -CTS eval -Discussed with primary -F/U with ID closely, outpatient appt on 4/7 with myself   Evaluation of this patient requires complex antimicrobial therapy evaluation and counseling + isolation needs for disease transmission risk assessment and mitigation    Microbiology:   Antibiotics: vanc   SUBJECTIVE: Resting in bed.   Review of Systems: Review of Systems  All other systems reviewed and are negative.    Scheduled Meds:  apixaban  5 mg Oral BID   bisacodyl  10 mg Oral Daily   gabapentin  200 mg Oral TID   hydrocerin   Topical BID   pramipexole  0.25 mg Oral TID   Continuous Infusions:  gentamicin 111.5 mL/hr at 06/02/23 1502   vancomycin Stopped (06/02/23 1400)   PRN Meds:.acetaminophen **OR** acetaminophen, hydrALAZINE, ipratropium-albuterol, melatonin, metoprolol tartrate, ondansetron (ZOFRAN) IV, oxyCODONE, trimethobenzamide Allergies  Allergen Reactions   Sulfa Antibiotics Hives, Swelling and Other (See Comments)    Facial/eye swelling     Coreg [Carvedilol] Other (See Comments)    Memory loss   Motrin [Ibuprofen] Palpitations   Toprol Xl [Metoprolol Tartrate] Cough   Doxycycline Hyclate Other (See Comments)    HEARTBURN   Flovent Hfa [Fluticasone] Itching   Statins Other (See Comments)    MENTAL STATUS CHANGE    OBJECTIVE: Vitals:   06/02/23 1220 06/02/23 1728 06/02/23 2035 06/03/23 0355  BP: 132/74 131/72 128/69 111/73  Pulse: 83 89 86 79  Resp: 18 18 17 18   Temp: (!) 97.5 F (36.4 C) (!) 97.4 F (36.3 C) (!) 97.4 F (36.3 C) (!) 97.5 F (36.4 C)  TempSrc: Oral Oral Oral Oral  SpO2: 91% 92% 90% 92%  Weight:    100.7 kg  Height:       Body mass index is 34.77 kg/m.  Physical Exam Constitutional:      Appearance: Normal appearance.  HENT:     Head: Normocephalic and atraumatic.     Right Ear: Tympanic membrane normal.     Left Ear: Tympanic membrane normal.     Nose: Nose normal.     Mouth/Throat:     Mouth: Mucous membranes are moist.  Eyes:     Extraocular Movements: Extraocular movements intact.     Conjunctiva/sclera: Conjunctivae normal.     Pupils: Pupils are equal, round, and reactive to light.  Cardiovascular:     Rate and Rhythm: Normal rate and regular rhythm.     Heart sounds: No murmur heard.    No friction rub. No gallop.  Pulmonary:     Effort: Pulmonary effort  is normal.     Breath sounds: Normal breath sounds.  Abdominal:     General: Abdomen is flat.     Palpations: Abdomen is soft.  Skin:    General: Skin is warm and dry.  Neurological:     General: No focal deficit present.     Mental Status: She is alert and oriented to person, place, and time.  Psychiatric:        Mood and Affect: Mood normal.       Lab Results Lab Results  Component Value Date   WBC 6.8 06/02/2023   HGB 13.7 06/02/2023   HCT 40.9 06/02/2023   MCV 85.2 06/02/2023   PLT 177 06/02/2023    Lab Results  Component Value Date   CREATININE 0.59 06/02/2023   BUN 8 06/02/2023   NA 137 06/02/2023    K 3.6 06/02/2023   CL 102 06/02/2023   CO2 27 06/02/2023    Lab Results  Component Value Date   ALT 24 05/26/2023   AST 36 05/26/2023   ALKPHOS 81 05/26/2023   BILITOT 1.6 (H) 05/26/2023        Danelle Earthly, MD [facility] 06/03/2023, 6:23 AM

## 2023-06-03 NOTE — Progress Notes (Signed)
 Peripherally Inserted Central Catheter Placement  The IV Nurse has discussed with the patient and/or persons authorized to consent for the patient, the purpose of this procedure and the potential benefits and risks involved with this procedure.  The benefits include less needle sticks, lab draws from the catheter, and the patient may be discharged home with the catheter. Risks include, but not limited to, infection, bleeding, blood clot (thrombus formation), and puncture of an artery; nerve damage and irregular heartbeat and possibility to perform a PICC exchange if needed/ordered by physician.  Alternatives to this procedure were also discussed.  Bard Power PICC patient education guide, fact sheet on infection prevention and patient information card has been provided to patient /or left at bedside.    PICC Placement Documentation  PICC Double Lumen 06/03/23 Right Basilic 39 cm 1 cm (Active)  Indication for Insertion or Continuance of Line Prolonged intravenous therapies 06/03/23 1613  Exposed Catheter (cm) 1 cm 06/03/23 1613  Site Assessment Clean, Dry, Intact 06/03/23 1613  Lumen #1 Status Flushed;Saline locked;Blood return noted 06/03/23 1613  Lumen #2 Status Flushed;Saline locked;Blood return noted 06/03/23 1613  Dressing Type Transparent;Securing device;Pressure 06/03/23 1613  Dressing Status None;Other (Comment) 06/03/23 1613  Line Care Connections checked and tightened 06/03/23 1613  Line Adjustment (NICU/IV Team Only) No 06/03/23 1613  Dressing Intervention New dressing;Adhesive placed at insertion site (IV team only) 06/03/23 1613  Dressing Change Due 06/05/23 06/03/23 1613       Vernona Rieger  Thurman Sarver 06/03/2023, 4:17 PM

## 2023-06-03 NOTE — Progress Notes (Signed)
 Pharmacy Antibiotic Note  Denise Kelly is a 83 y.o. female admitted on 05/26/2023 with MRSE, staph lugdunensis, and enterococcus faecalis bacteremia and endocarditis Pharmacy has been consulted for vancomycin + gentamicin dosing. 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 SCr stable at < 1.   Vancomycin levels today show a peak of 39 and a trough of 13 for a supratherapeutic AUC of 560.    Plan: Reduce Vancomycin to 1500 mg every 24 hours - Predicted AUC 480 Continue Gentamicin 230 mg IV q 24 hours  Monitor renal function  Plan 6 weeks of therapy    Height: 5\' 7"  (170.2 cm) Weight: 100.7 kg (222 lb 0.1 oz) IBW/kg (Calculated) : 61.6  Temp (24hrs), Avg:97.7 F (36.5 C), Min:97.4 F (36.3 C), Max:98.4 F (36.9 C)  Recent Labs  Lab 05/30/23 0638 05/30/23 1948 05/30/23 2056 05/31/23 0729 06/01/23 0819 06/02/23 0526 06/03/23 0758 06/03/23 1326  WBC 6.7  --   --  6.3 7.0 6.8 7.8  --   CREATININE 0.81  --   --  0.85 0.64 0.59 0.62  --   VANCOTROUGH  --   --   --   --   --   --  13*  --   VANCOPEAK  --    < > 17*  --   --   --   --  39   < > = values in this interval not displayed.    Estimated Creatinine Clearance: 67.2 mL/min (by C-G formula based on SCr of 0.62 mg/dL).    Allergies  Allergen Reactions   Sulfa Antibiotics Hives, Swelling and Other (See Comments)    Facial/eye swelling    Coreg [Carvedilol] Other (See Comments)    Memory loss   Motrin [Ibuprofen] Palpitations   Toprol Xl [Metoprolol Tartrate] Cough   Doxycycline Hyclate Other (See Comments)    HEARTBURN   Flovent Hfa [Fluticasone] Itching   Statins Other (See Comments)    MENTAL STATUS CHANGE     Thank you for involving pharmacy in the patient's care.    [name], PharmD, BCPS, BCIDP Infectious Diseases Clinical Pharmacist Phone: [phone] Please check AMION for all [facility] Pharmacy numbers 06/03/2023

## 2023-06-03 NOTE — Plan of Care (Signed)

## 2023-06-03 NOTE — Progress Notes (Signed)
 Mobility Specialist: Progress Note   06/03/23 1325  Mobility  Activity Transferred from bed to chair (Simultaneous filing. User may not have seen previous data.)  Level of Assistance +2 (takes two people)  Musician  Activity Response Tolerated fair  Mobility Referral Yes  Mobility visit 1 Mobility  Mobility Specialist Start Time (ACUTE ONLY) 1310  Mobility Specialist Stop Time (ACUTE ONLY) 1320  Mobility Specialist Time Calculation (min) (ACUTE ONLY) 10 min    Pt requesting to sit in chair - received in bed. Very hesitant about mobility and fearful of falling. Became agitated with staff. MinA+2 for bed mobility to assist with scooting EOB, MinA+2 for STS and stand pivot to chair. Left in chair with 2 NT in room.    Denise Kelly Mobility Specialist Please contact via SecureChat or Rehab office at [phone]

## 2023-06-03 NOTE — Plan of Care (Signed)
 Patient AAOx4, HOH, irritable. Multiple verbal outbursts today to staff. Purewick in place, MAD to groin, placed in LDA. Patient educated that purewick needs to be removed, verbalized understanding and refused to allow purewick removal.  While attempting transfer back to bed from chair patient refused to move until writer promised to place purewick back, reeducated patient on redness to area and the benefit of not having the purewick in place. Patient continues to refuse and not participate in care unless purewick gets placed back. PICC placed to RUA. Dressing changed to RLE, no open wound, scabbed over. Full bed change done, while Airline pilot attempted one more time to reeducate patient on not needing purewick, patient called Clinical research associate stupid and continued to refuse.  Safety precautions maintained.  Problem: Clinical Measurements: Goal: Will remain free from infection Outcome: Progressing   Problem: Nutrition: Goal: Adequate nutrition will be maintained Outcome: Progressing   Problem: Pain Managment: Goal: General experience of comfort will improve and/or be controlled Outcome: Progressing   Problem: Skin Integrity: Goal: Risk for impaired skin integrity will decrease Outcome: Progressing   Problem: Education: Goal: Knowledge of General Education information will improve Description: Including pain rating scale, medication(s)/side effects and non-pharmacologic comfort measures Outcome: Not Progressing   Problem: Activity: Goal: Risk for activity intolerance will decrease Outcome: Not Progressing

## 2023-06-03 NOTE — TOC Progression Note (Signed)
 Transition of Care [facility]) - Progression Note    Patient Details  Name: Denise Kelly MRN: [id number] Date of Birth: 05/06/1940  [facility] CM/SW Contact  Dellamae Rosamilia A [place], LCSW Phone Number: 06/03/2023, 2:20 PM  Clinical Narrative:     CSW met with pt at bedside. CSW informed her that pt cannot DC back to [facility] as they declined bed offer for SNF. CSW provided updated bed offers. She said that she did not want any facilities, CSW explained that pt will be needing IV Abx as well as short term rehabilitation. She stated that she wanted to reach out to [facility] and see if they can provide assistance.    TOC will continue to follow.   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                                       Social Determinants of Health (SDOH) Interventions SDOH Screenings   Food Insecurity: No Food Insecurity (05/27/2023)  Housing: Low Risk  (05/27/2023)  Transportation Needs: No Transportation Needs (05/27/2023)  Utilities: Not At Risk (05/27/2023)  Depression (PHQ2-9): Low Risk  (01/09/2019)  Social Connections: Socially Isolated (05/27/2023)  Tobacco Use: Medium Risk (05/26/2023)    Readmission Risk Interventions    04/08/2023    1:02 PM 04/06/2023    3:38 PM 05/02/2022   11:19 AM  Readmission Risk Prevention Plan  Transportation Screening Complete Complete Complete  PCP or Specialist Appt within 3-5 Days Complete Complete   HRI or Home Care Consult Complete Complete   Social Work Consult for Recovery Care Planning/Counseling Complete Complete   Palliative Care Screening Complete Not Applicable   Medication Review Oceanographer) Complete Complete Complete  PCP or Specialist appointment  within 3-5 days of discharge   Complete  HRI or Home Care Consult   Complete  SW Recovery Care/Counseling Consult   Complete  Palliative Care Screening   Not Applicable  Skilled Nursing Facility   Complete

## 2023-06-03 NOTE — Progress Notes (Signed)
 Occupational Therapy Treatment Patient Details Name: Denise Kelly MRN: [id number] DOB: 1940-06-05 Today's Date: 06/03/2023   History of present illness 83 y.o. presents to [facility]/26/23 due to weeping wound in the RLE found at living facility sitting in feces and urine. Urinalysis concerning for infection and CTA with signs of consolidation of R lower lobe.  Recent admit 2/4, 1/17 and 1/22 for UTI and BLE cellulitis. PMHx: alcohol abuse, anxiety/depression, GERD, HTN, HOH, obesity, chronic diastolic CHF, PE   OT comments  Pt progressing toward goals this session, needing mod-total A for ADLs at bed level. Pt min A for rolling, declines sitting EOB this session. Pt's bed soiled upon arrival with pt unaware. Pt rolling for pericare and UB dressing. Pt presenting with impairments listed below, will follow acutely. Patient will benefit from continued inpatient follow up therapy, <3 hours/day to maximize safety/ind with ADL/functional mobility.       If plan is discharge home, recommend the following:  A lot of help with bathing/dressing/bathroom;A lot of help with walking and/or transfers;Assistance with cooking/housework;Direct supervision/assist for medications management;Assist for transportation;Help with stairs or ramp for entrance   Equipment Recommendations  None recommended by OT    Recommendations for Other Services PT consult    Precautions / Restrictions Precautions Precautions: Fall Recall of Precautions/Restrictions: Impaired Restrictions Weight Bearing Restrictions Per Provider Order: No       Mobility Bed Mobility Overal bed mobility: Needs Assistance Bed Mobility: Rolling Rolling: Min assist         General bed mobility comments: pt declines sitting EOB, also can pull back off of bed for UB dressing    Transfers                   General transfer comment: Pt refused.     Balance                                           ADL  either performed or assessed with clinical judgement   ADL Overall ADL's : Needs assistance/impaired                 Upper Body Dressing : Moderate assistance;Bed level           Toileting- Clothing Manipulation and Hygiene: Total assistance Toileting - Clothing Manipulation Details (indicate cue type and reason): pericare at bed level            Extremity/Trunk Assessment Upper Extremity Assessment Upper Extremity Assessment: Generalized weakness   Lower Extremity Assessment Lower Extremity Assessment: Defer to PT evaluation        Vision       Perception Perception Perception: Not tested   Praxis Praxis Praxis: Not tested   Communication Communication Communication: Impaired Factors Affecting Communication: Hearing impaired   Cognition Arousal: Alert Behavior During Therapy: Agitated               OT - Cognition Comments: overall disgruntled with pain from purewick and bed mobility                 Following commands: Intact        Cueing   Cueing Techniques: Verbal cues  Exercises      Shoulder Instructions       General Comments VSS    Pertinent Vitals/ Pain       Pain Assessment Pain Assessment: Faces Pain Score:  4  Faces Pain Scale: Hurts little more Pain Location: purewick "pinching" her Pain Descriptors / Indicators: Discomfort, Grimacing Pain Intervention(s): Limited activity within patient's tolerance, Monitored during session, Repositioned  Home Living                                          Prior Functioning/Environment              Frequency  Min 1X/week        Progress Toward Goals  OT Goals(current goals can now be found in the care plan section)  Progress towards OT goals: Progressing toward goals  Acute Rehab OT Goals Patient Stated Goal: none stated OT Goal Formulation: With patient Time For Goal Achievement: 06/12/23 Potential to Achieve Goals: Good ADL Goals Pt  Will Perform Grooming: with contact guard assist;with supervision;with set-up;sitting Pt Will Perform Upper Body Bathing: with mod assist;with min assist;sitting Pt Will Perform Upper Body Dressing: with mod assist;with min assist;sitting Pt Will Transfer to Toilet: with max assist;with mod assist;stand pivot transfer;bedside commode Pt Will Perform Toileting - Clothing Manipulation and hygiene: with max assist;with mod assist;sitting/lateral leans;sit to/from stand  Plan      Co-evaluation                 AM-PAC OT "6 Clicks" Daily Activity     Outcome Measure   Help from another person eating meals?: A Little Help from another person taking care of personal grooming?: A Little Help from another person toileting, which includes using toliet, bedpan, or urinal?: A Lot Help from another person bathing (including washing, rinsing, drying)?: A Lot Help from another person to put on and taking off regular upper body clothing?: A Lot Help from another person to put on and taking off regular lower body clothing?: A Lot 6 Click Score: 14    End of Session    OT Visit Diagnosis: Other abnormalities of gait and mobility (R26.89);Muscle weakness (generalized) (M62.81)   Activity Tolerance Treatment limited secondary to agitation   Patient Left in bed;with call bell/phone within reach;with bed alarm set   Nurse Communication Mobility status        Time: 1610-9604 OT Time Calculation (min): 16 min  Charges: OT General Charges $OT Visit: 1 Visit OT Treatments $Self Care/Home Management : 8-22 mins  Denise Fila, OTD, OTR/L SecureChat Preferred Acute Rehab [phone]   Denise Kelly 06/03/2023, 10:47 AM

## 2023-06-03 NOTE — Progress Notes (Signed)
 [facility]  Date of Admission:  05/26/2023   Principal Problem:   Urinary tract infection Active Problems:   Bacteremia          Assessment: 83 year old femaleWith past medical history of alcohol abuse, anxiety/depression, panic attacks, hard of hearing presented due to confusion from skilled facility.  She has a wound on her right lower leg that has been weeping.  WBC 17K.  Patient afebrile.  Of note patient was at a facility, noted that there was a norovirus outbreak going on.  GIP negative, C. difficile negative.  Blood cultures BCID shows 1/2 set E faecalis and methicillin-resistant staph lugdunensis.  ID engaged. #E faecalis and staph lugdunensis(MR) methicillin-resistant bacteremia with native MV endocarditis secondary to right lower will leg wound versus gi translocation -TEE showed mass on MV leaflet. CTS recommended medical management per prelim note Plan: -continue gentamicin and  vancomycin -Place PICC -Plan on 6 weeks of abx -F/U with ID closely, outpatient appt on 4/7 with myself  OPAT ORDERS:  Diagnosis: E faecalis and staph lugdunensis methicillin-resistant bacteremia with native MV endocarditis  Allergies  Allergen Reactions   Sulfa Antibiotics Hives, Swelling and Other (See Comments)    Facial/eye swelling    Coreg [Carvedilol] Other (See Comments)    Memory loss   Motrin [Ibuprofen] Palpitations   Toprol Xl [Metoprolol Tartrate] Cough   Doxycycline Hyclate Other (See Comments)    HEARTBURN   Flovent Hfa [Fluticasone] Itching   Statins Other (See Comments)    MENTAL STATUS CHANGE     Discharge antibiotics to be given via PICC line:  Per pharmacy protocol Vancomycin to 1750mg  IV q24h  and Gentamicin 230 mg IV q 24 hours   Duration: 6 weeks End Date: 4/28  [facility] Care Per Protocol with Biopatch Use: Home health RN for IV administration and teaching, line care and labs.    Labs weekly while on IV antibiotics: x__  CBC with differential __x BMP **TWICE WEEKLY ON VANCOMYCIN  _x_ CMP _x_ CRP x__ ESR _x_ Vancomycin trough TWICE WEEKLY __ CK  __ Please pull PIC at completion of IV antibiotics x__ Please leave PIC in place until doctor has seen patient or been notified  Fax weekly labs to [phone]  Clinic Follow Up Appt: 4/7  @ [facility] with dr Thedore Mins  Evaluation of this patient requires complex antimicrobial therapy evaluation and counseling + isolation needs for disease transmission risk assessment and mitigation    Microbiology:   Antibiotics: vanc   SUBJECTIVE: Resting in bed.   Review of Systems: Review of Systems  All other systems reviewed and are negative.    Scheduled Meds:  apixaban  5 mg Oral BID   bisacodyl  10 mg Oral Daily   Chlorhexidine Gluconate Cloth  6 each Topical Daily   gabapentin  200 mg Oral TID   hydrocerin   Topical BID   pramipexole  0.25 mg Oral TID   sodium chloride flush  10-40 mL Intracatheter Q12H   Continuous Infusions:  gentamicin 230 mg (06/03/23 1628)   [START ON 06/04/2023] vancomycin     PRN Meds:.acetaminophen **OR** acetaminophen, hydrALAZINE, ipratropium-albuterol, melatonin, metoprolol tartrate, ondansetron (ZOFRAN) IV, oxyCODONE, sodium chloride flush, trimethobenzamide Allergies  Allergen Reactions   Sulfa Antibiotics Hives, Swelling and Other (See Comments)    Facial/eye swelling    Coreg [Carvedilol] Other (See Comments)    Memory loss   Motrin [Ibuprofen] Palpitations   Toprol Xl [Metoprolol Tartrate]  Cough   Doxycycline Hyclate Other (See Comments)    HEARTBURN   Flovent Hfa [Fluticasone] Itching   Statins Other (See Comments)    MENTAL STATUS CHANGE    OBJECTIVE: Vitals:   06/03/23 0355 06/03/23 0852 06/03/23 1605 06/03/23 2008  BP: 111/73 117/70 121/63 138/81  Pulse: 79 75 92 97  Resp: 18   20  Temp: (!) 97.5 F (36.4 C) 98.4 F (36.9 C) 97.7 F (36.5 C) 97.6 F (36.4 C)  TempSrc: Oral  Oral Oral  SpO2: 92%  91% 92% 93%  Weight: 100.7 kg     Height:       Body mass index is 34.77 kg/m.  Physical Exam Constitutional:      Appearance: Normal appearance.  HENT:     Head: Normocephalic and atraumatic.     Right Ear: Tympanic membrane normal.     Left Ear: Tympanic membrane normal.     Nose: Nose normal.     Mouth/Throat:     Mouth: Mucous membranes are moist.  Eyes:     Extraocular Movements: Extraocular movements intact.     Conjunctiva/sclera: Conjunctivae normal.     Pupils: Pupils are equal, round, and reactive to light.  Cardiovascular:     Rate and Rhythm: Normal rate and regular rhythm.     Heart sounds: No murmur heard.    No friction rub. No gallop.  Pulmonary:     Effort: Pulmonary effort is normal.     Breath sounds: Normal breath sounds.  Abdominal:     General: Abdomen is flat.     Palpations: Abdomen is soft.  Skin:    General: Skin is warm and dry.  Neurological:     General: No focal deficit present.     Mental Status: She is alert and oriented to person, place, and time.  Psychiatric:        Mood and Affect: Mood normal.       Lab Results Lab Results  Component Value Date   WBC 7.8 06/03/2023   HGB 15.1 (H) 06/03/2023   HCT 44.8 06/03/2023   MCV 85.8 06/03/2023   PLT 212 06/03/2023    Lab Results  Component Value Date   CREATININE 0.62 06/03/2023   BUN 7 (L) 06/03/2023   NA 139 06/03/2023   K 3.5 06/03/2023   CL 99 06/03/2023   CO2 30 06/03/2023    Lab Results  Component Value Date   ALT 24 05/26/2023   AST 36 05/26/2023   ALKPHOS 81 05/26/2023   BILITOT 1.6 (H) 05/26/2023        Danelle Earthly, MD [facility] 06/03/2023, 9:13 PM

## 2023-06-04 DIAGNOSIS — N3000 Acute cystitis without hematuria: Secondary | ICD-10-CM | POA: Diagnosis not present

## 2023-06-04 MED ORDER — SODIUM CHLORIDE 0.9 % IV SOLN
12.5000 mg | Freq: Once | INTRAVENOUS | Status: AC
  Administered 2023-06-04: 12.5 mg via INTRAVENOUS
  Filled 2023-06-04: qty 12.5

## 2023-06-04 NOTE — Progress Notes (Signed)
 PHARMACY CONSULT NOTE FOR:  OUTPATIENT  PARENTERAL ANTIBIOTIC THERAPY (OPAT)  Indication: Endocarditis Regimen: Vancomycin 1500 mg IV every 24 hours + Gentamicin 230 mg IV every 24 hours End date: 07/13/23  IV antibiotic discharge orders are pended. To discharging provider:  please sign these orders via discharge navigator,  Select New Orders & click on the button choice - Manage This Unsigned Work.     Thank you for allowing pharmacy to be a part of this patient's care.  [name], PharmD, BCPS, BCIDP Infectious Diseases Clinical Pharmacist Phone: [phone] 06/04/2023, 1:14 PM

## 2023-06-04 NOTE — Plan of Care (Signed)

## 2023-06-04 NOTE — Consult Note (Signed)
 [facility] Liaison Consult Note   06/04/2023  Denise Kelly 1940/09/02 [id number]  Insurance: [employer] Medicare Advantage  Primary Care Provider: Eloisa Northern, MD listed with [facility] Liaison screened the patient remotely at [facility].    The patient was screened for 30 day readmission hospitalization with noted extreme risk score for unplanned readmission risk 3 hospital admissions in 6 months.  The patient was assessed for potential [facility] Coordination service needs for post hospital transition for care coordination. Review of patient's electronic medical record reveals patient is being recommended as reviewed of PT eval, Inpatient TOC LCSW notes for a skilled nursing facility level of care and IV antibiotics and barriers for post hospital care.   Plan: [facility] Liaison will continue to follow progress and disposition to assess for post hospital community care coordination/management needs.  Referral request for community care coordination: disposition is still incomplete at the time of this review.   [facility] Community Care, Population Health does not replace or interfere with any arrangements made by the Inpatient Transition of Care team.   For questions contact:   Charlesetta Shanks, RN, BSN, CCM Platinum  [facility], [facility] Liaison Direct Dial: [phone] or secure chat Email: [email or web address]

## 2023-06-04 NOTE — Progress Notes (Signed)
 Physical Therapy Treatment Patient Details Name: Denise Kelly MRN: [id number] DOB: 09/20/1940 Today's Date: 06/04/2023   History of Present Illness 83 y.o. presents to [facility] 05/26/23 due to weeping wound in the RLE found at living facility sitting in feces and urine. Urinalysis concerning for infection and CTA with signs of consolidation of R lower lobe.  Recent admit 2/4, 1/17 and 1/22 for UTI and BLE cellulitis. PMHx: alcohol abuse, anxiety/depression, GERD, HTN, HOH, obesity, chronic diastolic CHF, PE    PT Comments  Pt showing good progress in today's PT session. Pt very fearful of falling. She reports no recent history and states she knows the fear is unfounded. Initially pt hesitant to attempt OOB with only +1, but agreeable with reassurance and encouragement. She required increased time to complete all mobility skilled. Mod assist bed mobility, mod assist sit to stand with RW, and min assist step pivot transfer with RW, bed>recliner. Pt in recliner with feet elevated at end of session. PT to continue to follow acutely. Current POC remains appropriate.     If plan is discharge home, recommend the following: A lot of help with walking and/or transfers;Assistance with cooking/housework;Assist for transportation;Supervision due to cognitive status   Can travel by private vehicle     No  Equipment Recommendations  Wheelchair cushion (measurements PT);Wheelchair (measurements PT);Hoyer lift;Hospital bed    Recommendations for Other Services       Precautions / Restrictions Precautions Precautions: Fall Recall of Precautions/Restrictions: Impaired     Mobility  Bed Mobility Overal bed mobility: Needs Assistance Bed Mobility: Supine to Sit     Supine to sit: Mod assist, HOB elevated, Used rails     General bed mobility comments: increased time, use of bed pad to scoot to EOB    Transfers Overall transfer level: Needs assistance Equipment used: Rolling walker (2  wheels) Transfers: Sit to/from Stand, Bed to chair/wheelchair/BSC Sit to Stand: Mod assist   Step pivot transfers: Min assist       General transfer comment: increased time, cues for hand placement and sequencing, assist to power up, pivot steps with RW toward L, bed>recliner    Ambulation/Gait                   Stairs             Wheelchair Mobility     Tilt Bed    Modified Rankin (Stroke Patients Only)       Balance Overall balance assessment: Needs assistance Sitting-balance support: Feet supported, Single extremity supported Sitting balance-Leahy Scale: Fair Sitting balance - Comments: increased fear of falling in sitting. Pt declining dizziness, vertigo.   Standing balance support: Bilateral upper extremity supported, During functional activity, Reliant on assistive device for balance Standing balance-Leahy Scale: Poor                              Communication Communication Communication: Impaired Factors Affecting Communication: Hearing impaired  Cognition Arousal: Alert Behavior During Therapy: Anxious   PT - Cognitive impairments: No family/caregiver present to determine baseline                       PT - Cognition Comments: Difficulty staying on task. Very fearful of falling. Following commands: Intact      Cueing Cueing Techniques: Verbal cues  Exercises      General Comments General comments (skin integrity, edema, etc.): VSS on RA  Pertinent Vitals/Pain Pain Assessment Pain Assessment: Faces Faces Pain Scale: No hurt    Home Living                          Prior Function            PT Goals (current goals can now be found in the care plan section) Acute Rehab PT Goals Patient Stated Goal: independence, return to apartment Progress towards PT goals: Progressing toward goals    Frequency    Min 1X/week      PT Plan      Co-evaluation              AM-PAC PT "6  Clicks" Mobility   Outcome Measure  Help needed turning from your back to your side while in a flat bed without using bedrails?: A Little Help needed moving from lying on your back to sitting on the side of a flat bed without using bedrails?: A Lot Help needed moving to and from a bed to a chair (including a wheelchair)?: A Lot Help needed standing up from a chair using your arms (e.g., wheelchair or bedside chair)?: A Lot Help needed to walk in hospital room?: Total Help needed climbing 3-5 steps with a railing? : Total 6 Click Score: 11    End of Session Equipment Utilized During Treatment: Gait belt Activity Tolerance: Patient tolerated treatment well Patient left: in chair;with call bell/phone within reach;with chair alarm set Nurse Communication: Mobility status PT Visit Diagnosis: Other abnormalities of gait and mobility (R26.89);Muscle weakness (generalized) (M62.81)     Time: 4098-1191 PT Time Calculation (min) (ACUTE ONLY): 26 min  Charges:    $Therapeutic Activity: 23-37 mins PT General Charges $$ ACUTE PT VISIT: 1 Visit                     Ferd Glassing., PT  Office # [phone]    Ilda Foil 06/04/2023, 11:56 AM

## 2023-06-04 NOTE — Plan of Care (Signed)

## 2023-06-04 NOTE — Progress Notes (Signed)
 PROGRESS NOTE    Denise Kelly  WUJ:[id number] DOB: 1940/10/27 DOA: 05/26/2023 PCP: Eloisa Northern, MD   Brief Narrative:  83 year old with history of alcohol use, anxiety, HTN came to the ED with confusion. Reports of weeping wound in the right lower extremity, found at her living facility sitting in feces and urine. In the ER, concerns of urinary tract infection, right lower lobe consolidation. She also reported diarrhea, reportedly has had norovirus going around in her facility. Eventually blood cultures grew multiple species with some concerns of possible contaminated therefore repeat surveillance cultures ordered. Infectious disease also consulted for their input. In terms of her diarrhea GI panel and C. difficile studies were negative. Echocardiogram is negative for vegetation. TEE recommended. 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  Seen by CT surgery, not a candidate for surgical repair.  ID recommended 6 weeks of antibiotics.  Details below.  Assessment & Plan:   Principal Problem:   Urinary tract infection Active Problems:   Bacteremia   Acute metabolic encephalopathy: Resolved.  Fully alert and oriented.  Multi organism bacteremia with concerns of endocarditis - Growing E faecalis, staph epi dermatitis and staph lugdunensis.cho without overt dysfunction/evidence of vegetation - TEE shows questionable echodense mass on multiple valves - CT surgery contacted - not candidate for valve replacement/debridement. -Repeat cultures remain negative to date - ID consulted - now on vancomycin/gentomycin - plan for 6 weeks of therapy; outpatient followup 4/7 with ID Dr Thedore Mins - E   Diarrhea, resolved - Norovirus cases reported at her facility.  GI panel and C. difficile is negative here.   Hypokalemia/hypocalcemia - As needed repletion   Chronic diastolic CHF, 65% - Overall appears to be compensated.  Prior echocardiogram showing EF of 65%   Chronic venous stasis lower  extremity bilaterally - Recently treated 7 days of IV Rocephin about 5 weeks ago   GERD - PPI   PE, per history -Continue outpatient Eliquis   Generalized weakness: PT OT recommended SNF.  TOC working on placement.  [facility] is not willing to take her back.  Patient needs to select facility.  DVT prophylaxis: SCDs Start: 05/27/23 0138 Eliquis   Code Status: Full Code  Family Communication:  None present at bedside.  Plan of care discussed with patient in length and he/she verbalized understanding and agreed with it.  Status is: Inpatient Remains inpatient appropriate because: Pending placement, patient medically stable.   Estimated body mass index is 34.77 kg/m as calculated from the following:   Height as of this encounter: 5\' 7"  (1.702 m).   Weight as of this encounter: 100.7 kg.    Nutritional Assessment: Body mass index is 34.77 kg/m.Marland Kitchen Seen by dietician.  I agree with the assessment and plan as outlined below: Nutrition Status:        . Skin Assessment: I have examined the patient's skin and I agree with the wound assessment as performed by the wound care RN as outlined below:    Consultants:  ID and CTS  Procedures:  As above  Antimicrobials:  Anti-infectives (From admission, onward)    Start     Dose/Rate Route Frequency Ordered Stop   06/04/23 0900  vancomycin (VANCOREADY) IVPB 1500 mg/300 mL        1,500 mg 150 mL/hr over 120 Minutes Intravenous Every 24 hours 06/03/23 1453     06/02/23 1445  gentamicin (GARAMYCIN) 230 mg in dextrose 5 % 50 mL IVPB        3 mg/kg  76.6  kg (Adjusted) 111.5 mL/hr over 30 Minutes Intravenous Every 24 hours 06/02/23 1348     05/31/23 0900  vancomycin (VANCOREADY) IVPB 1750 mg/350 mL  Status:  Discontinued        1,750 mg 175 mL/hr over 120 Minutes Intravenous Every 24 hours 05/30/23 2148 06/03/23 1453   05/28/23 1800  vancomycin (VANCOREADY) IVPB 1250 mg/250 mL  Status:  Discontinued        1,250 mg 166.7 mL/hr over  90 Minutes Intravenous Every 24 hours 05/27/23 1707 05/28/23 1210   05/28/23 1800  Vancomycin (VANCOCIN) 1,250 mg in sodium chloride 0.9 % 250 mL IVPB  Status:  Discontinued        1,250 mg 166.7 mL/hr over 90 Minutes Intravenous Every 24 hours 05/28/23 1209 05/30/23 2148   05/27/23 2300  cefTRIAXone (ROCEPHIN) 1 g in sodium chloride 0.9 % 100 mL IVPB  Status:  Discontinued        1 g 200 mL/hr over 30 Minutes Intravenous Every 24 hours 05/27/23 0147 05/28/23 1112   05/27/23 1800  vancomycin (VANCOREADY) IVPB 2000 mg/400 mL        2,000 mg 200 mL/hr over 120 Minutes Intravenous  Once 05/27/23 1707 05/27/23 2026   05/26/23 2315  cefTRIAXone (ROCEPHIN) 1 g in sodium chloride 0.9 % 100 mL IVPB        1 g 200 mL/hr over 30 Minutes Intravenous  Once 05/26/23 2308 05/26/23 2346         Subjective: Patient seen and examined.  She has no complaints whatsoever.  She prefers to go back to the [facility].  I tried to convince her to pick another facility since [facility] is not willing to take her back.  Discussed with Child psychotherapist as well.  Objective: Vitals:   06/03/23 1605 06/03/23 2008 06/04/23 0359 06/04/23 0832  BP: 121/63 138/81 119/60 139/76  Pulse: 92 97 78 92  Resp:  20 20 18   Temp: 97.7 F (36.5 C) 97.6 F (36.4 C) 98 F (36.7 C) 98.2 F (36.8 C)  TempSrc: Oral Oral Oral Oral  SpO2: 92% 93% 92% 94%  Weight:      Height:        Intake/Output Summary (Last 24 hours) at 06/04/2023 0838 Last data filed at 06/04/2023 0300 Gross per 24 hour  Intake --  Output 1150 ml  Net -1150 ml   Filed Weights   05/27/23 0115 06/03/23 0355  Weight: 99 kg 100.7 kg    Examination:  General exam: Appears calm and comfortable  Respiratory system: Clear to auscultation. Respiratory effort normal. Cardiovascular system: S1 & S2 heard, RRR. No JVD, murmurs, rubs, gallops or clicks. No pedal edema. Gastrointestinal system: Abdomen is nondistended, soft and nontender. No organomegaly or  masses felt. Normal bowel sounds heard. Central nervous system: Alert and oriented. No focal neurological deficits. Extremities: Symmetric 5 x 5 power. Skin: No rashes, lesions or ulcers  Data Reviewed: I have personally reviewed following labs and imaging studies  CBC: Recent Labs  Lab 05/30/23 0638 05/31/23 0729 06/01/23 0819 06/02/23 0526 06/03/23 0758  WBC 6.7 6.3 7.0 6.8 7.8  HGB 15.4* 14.5 14.8 13.7 15.1*  HCT 46.6* 43.9 44.0 40.9 44.8  MCV 86.6 86.1 84.8 85.2 85.8  PLT 169 176 186 177 212   Basic Metabolic Panel: Recent Labs  Lab 05/28/23 0841 05/29/23 0740 05/30/23 0638 05/31/23 0729 06/01/23 0819 06/02/23 0526 06/03/23 0758  NA 139   < > 139 137 136 137 139  K 3.1*   < >  4.5 3.9 3.7 3.6 3.5  CL 106   < > 104 101 102 102 99  CO2 22   < > 23 31 25 27 30   GLUCOSE 86   < > 91 122* 127* 106* 123*  BUN 7*   < > 8 12 8 8  7*  CREATININE 0.48   < > 0.81 0.85 0.64 0.59 0.62  CALCIUM 8.5*   < > 8.7* 8.4* 8.4* 8.3* 8.9  MG 1.7   < > 1.9 1.8 1.9 1.9 1.8  PHOS 3.2  --   --   --   --   --   --    < > = values in this interval not displayed.   GFR: Estimated Creatinine Clearance: 67.2 mL/min (by C-G formula based on SCr of 0.62 mg/dL). Liver Function Tests: No results for input(s): "AST", "ALT", "ALKPHOS", "BILITOT", "PROT", "ALBUMIN" in the last 168 hours. No results for input(s): "LIPASE", "AMYLASE" in the last 168 hours. No results for input(s): "AMMONIA" in the last 168 hours. Coagulation Profile: No results for input(s): "INR", "PROTIME" in the last 168 hours. Cardiac Enzymes: No results for input(s): "CKTOTAL", "CKMB", "CKMBINDEX", "TROPONINI" in the last 168 hours. BNP (last 3 results) No results for input(s): "PROBNP" in the last 8760 hours. HbA1C: No results for input(s): "HGBA1C" in the last 72 hours. CBG: No results for input(s): "GLUCAP" in the last 168 hours. Lipid Profile: No results for input(s): "CHOL", "HDL", "LDLCALC", "TRIG", "CHOLHDL",  "LDLDIRECT" in the last 72 hours. Thyroid Function Tests: No results for input(s): "TSH", "T4TOTAL", "FREET4", "T3FREE", "THYROIDAB" in the last 72 hours. Anemia Panel: No results for input(s): "VITAMINB12", "FOLATE", "FERRITIN", "TIBC", "IRON", "RETICCTPCT" in the last 72 hours. Sepsis Labs: No results for input(s): "PROCALCITON", "LATICACIDVEN" in the last 168 hours.  Recent Results (from the past 240 hours)  Culture, blood (Routine X 2) w Reflex to ID Panel     Status: Abnormal   Collection Time: 05/26/23  8:01 PM   Specimen: BLOOD  Result Value Ref Range Status   Specimen Description BLOOD LEFT ANTECUBITAL  Final   Special Requests   Final    BOTTLES DRAWN AEROBIC AND ANAEROBIC Blood Culture adequate volume   Culture  Setup Time   Final    GRAM POSITIVE COCCI IN CHAINS IN BOTH AEROBIC AND ANAEROBIC BOTTLES CRITICAL RESULT CALLED TO, READ BACK BY AND VERIFIED WITH: Chana Bode [id number] @1646  FH Performed at [facility], [address], [place], [place] [address]    Culture (A)  Final    ENTEROCOCCUS FAECALIS STAPHYLOCOCCUS LUGDUNENSIS STAPHYLOCOCCUS EPIDERMIDIS    Report Status 06/03/2023 FINAL  Final   Organism ID, Bacteria ENTEROCOCCUS FAECALIS  Final   Organism ID, Bacteria STAPHYLOCOCCUS EPIDERMIDIS  Final   Organism ID, Bacteria STAPHYLOCOCCUS LUGDUNENSIS  Final      Susceptibility   Enterococcus faecalis - MIC*    AMPICILLIN <=2 SENSITIVE Sensitive     VANCOMYCIN 1 SENSITIVE Sensitive     GENTAMICIN SYNERGY SENSITIVE Sensitive     * ENTEROCOCCUS FAECALIS   Staphylococcus epidermidis - MIC*    CIPROFLOXACIN >=8 RESISTANT Resistant     ERYTHROMYCIN <=0.25 SENSITIVE Sensitive     GENTAMICIN <=0.5 SENSITIVE Sensitive     OXACILLIN >=4 RESISTANT Resistant     TETRACYCLINE <=1 SENSITIVE Sensitive     VANCOMYCIN 1 SENSITIVE Sensitive     TRIMETH/SULFA 20 SENSITIVE Sensitive     CLINDAMYCIN <=0.25 SENSITIVE Sensitive     RIFAMPIN <=0.5 SENSITIVE Sensitive  Inducible Clindamycin NEGATIVE Sensitive     * STAPHYLOCOCCUS EPIDERMIDIS   Staphylococcus lugdunensis - MIC*    CIPROFLOXACIN <=0.5 SENSITIVE Sensitive     ERYTHROMYCIN <=0.25 SENSITIVE Sensitive     GENTAMICIN <=0.5 SENSITIVE Sensitive     OXACILLIN >=4 RESISTANT Resistant     TETRACYCLINE <=1 SENSITIVE Sensitive     VANCOMYCIN <=0.5 SENSITIVE Sensitive     TRIMETH/SULFA <=10 SENSITIVE Sensitive     CLINDAMYCIN <=0.25 SENSITIVE Sensitive     RIFAMPIN <=0.5 SENSITIVE Sensitive     Inducible Clindamycin NEGATIVE Sensitive     * STAPHYLOCOCCUS LUGDUNENSIS  Blood Culture ID Panel (Reflexed)     Status: Abnormal   Collection Time: 05/26/23  8:01 PM  Result Value Ref Range Status   Enterococcus faecalis DETECTED (A) NOT DETECTED Final    Comment: CRITICAL RESULT CALLED TO, READ BACK BY AND VERIFIED WITH: PHARMD Dollene Cleveland @1646  FH    Enterococcus Faecium NOT DETECTED NOT DETECTED Final   Listeria monocytogenes NOT DETECTED NOT DETECTED Final   Staphylococcus species DETECTED (A) NOT DETECTED Final    Comment: CRITICAL RESULT CALLED TO, READ BACK BY AND VERIFIED WITH: PHARMD Dollene Cleveland @1646  FH    Staphylococcus aureus (BCID) NOT DETECTED NOT DETECTED Final   Staphylococcus epidermidis DETECTED (A) NOT DETECTED Final    Comment: Methicillin (oxacillin) resistant coagulase negative staphylococcus. Possible blood culture contaminant (unless isolated from more than one blood culture draw or clinical case suggests pathogenicity). No antibiotic treatment is indicated for blood  culture contaminants. CRITICAL RESULT CALLED TO, READ BACK BY AND VERIFIED WITH: PHARMD Dollene Cleveland @1646  FH    Staphylococcus lugdunensis DETECTED (A) NOT DETECTED Final    Comment: Methicillin (oxacillin) resistant coagulase negative staphylococcus. Possible blood culture contaminant (unless isolated from more than one blood culture draw or clinical case suggests pathogenicity). No  antibiotic treatment is indicated for blood  culture contaminants. CRITICAL RESULT CALLED TO, READ BACK BY AND VERIFIED WITH: PHARMD Dollene Cleveland @1646  FH    Streptococcus species NOT DETECTED NOT DETECTED Final   Streptococcus agalactiae NOT DETECTED NOT DETECTED Final   Streptococcus pneumoniae NOT DETECTED NOT DETECTED Final   Streptococcus pyogenes NOT DETECTED NOT DETECTED Final   A.calcoaceticus-baumannii NOT DETECTED NOT DETECTED Final   Bacteroides fragilis NOT DETECTED NOT DETECTED Final   Enterobacterales NOT DETECTED NOT DETECTED Final   Enterobacter cloacae complex NOT DETECTED NOT DETECTED Final   Escherichia coli NOT DETECTED NOT DETECTED Final   Klebsiella aerogenes NOT DETECTED NOT DETECTED Final   Klebsiella oxytoca NOT DETECTED NOT DETECTED Final   Klebsiella pneumoniae NOT DETECTED NOT DETECTED Final   Proteus species NOT DETECTED NOT DETECTED Final   Salmonella species NOT DETECTED NOT DETECTED Final   Serratia marcescens NOT DETECTED NOT DETECTED Final   Haemophilus influenzae NOT DETECTED NOT DETECTED Final   Neisseria meningitidis NOT DETECTED NOT DETECTED Final   Pseudomonas aeruginosa NOT DETECTED NOT DETECTED Final   Stenotrophomonas maltophilia NOT DETECTED NOT DETECTED Final   Candida albicans NOT DETECTED NOT DETECTED Final   Candida auris NOT DETECTED NOT DETECTED Final   Candida glabrata NOT DETECTED NOT DETECTED Final   Candida krusei NOT DETECTED NOT DETECTED Final   Candida parapsilosis NOT DETECTED NOT DETECTED Final   Candida tropicalis NOT DETECTED NOT DETECTED Final   Cryptococcus neoformans/gattii NOT DETECTED NOT DETECTED Final   Methicillin resistance mecA/C DETECTED (A) NOT DETECTED Final    Comment: CRITICAL RESULT CALLED TO, READ BACK BY  AND VERIFIED WITH: PHARMD Isabel Caprice [id number] @1646  FH    Vancomycin resistance NOT DETECTED NOT DETECTED Final    Comment: Performed at [facility], [address] [place], [place]  [address]  Culture, blood (Routine X 2) w Reflex to ID Panel     Status: None   Collection Time: 05/26/23  8:07 PM   Specimen: BLOOD LEFT ARM  Result Value Ref Range Status   Specimen Description BLOOD LEFT ARM  Final   Special Requests   Final    BOTTLES DRAWN AEROBIC ONLY Blood Culture results may not be optimal due to an inadequate volume of blood received in culture bottles   Culture   Final    NO GROWTH 5 DAYS Performed at [facility], [address], [place], [place] [address]    Report Status 05/31/2023 FINAL  Final  C Difficile Quick Screen w PCR reflex     Status: None   Collection Time: 05/27/23  1:39 AM   Specimen: STOOL  Result Value Ref Range Status   C Diff antigen NEGATIVE NEGATIVE Final   C Diff toxin NEGATIVE NEGATIVE Final   C Diff interpretation No C. difficile detected.  Final    Comment: Performed at [facility], [address], [place], [place] [address]  Gastrointestinal Panel by PCR , Stool     Status: None   Collection Time: 05/27/23  1:39 AM   Specimen: STOOL  Result Value Ref Range Status   Campylobacter species NOT DETECTED NOT DETECTED Final   Plesimonas shigelloides NOT DETECTED NOT DETECTED Final   Salmonella species NOT DETECTED NOT DETECTED Final   Yersinia enterocolitica NOT DETECTED NOT DETECTED Final   Vibrio species NOT DETECTED NOT DETECTED Final   Vibrio cholerae NOT DETECTED NOT DETECTED Final   Enteroaggregative E coli (EAEC) NOT DETECTED NOT DETECTED Final   Enteropathogenic E coli (EPEC) NOT DETECTED NOT DETECTED Final   Enterotoxigenic E coli (ETEC) NOT DETECTED NOT DETECTED Final   Shiga like toxin producing E coli (STEC) NOT DETECTED NOT DETECTED Final   Shigella/Enteroinvasive E coli (EIEC) NOT DETECTED NOT DETECTED Final   Cryptosporidium NOT DETECTED NOT DETECTED Final   Cyclospora cayetanensis NOT DETECTED NOT DETECTED Final   Entamoeba histolytica NOT DETECTED NOT DETECTED Final   Giardia lamblia NOT DETECTED NOT  DETECTED Final   Adenovirus F40/41 NOT DETECTED NOT DETECTED Final   Astrovirus NOT DETECTED NOT DETECTED Final   Norovirus GI/GII NOT DETECTED NOT DETECTED Final   Rotavirus A NOT DETECTED NOT DETECTED Final   Sapovirus (I, II, IV, and V) NOT DETECTED NOT DETECTED Final    Comment: Performed at [facility], [address] [place], [place] [address]  Culture, blood (Routine X 2) w Reflex to ID Panel     Status: None   Collection Time: 05/28/23  8:41 AM   Specimen: BLOOD  Result Value Ref Range Status   Specimen Description BLOOD SITE NOT SPECIFIED  Final   Special Requests   Final    BOTTLES DRAWN AEROBIC AND ANAEROBIC Blood Culture results may not be optimal due to an inadequate volume of blood received in culture bottles   Culture   Final    NO GROWTH 5 DAYS Performed at [facility], [address], [place], [place] [address]    Report Status 06/02/2023 FINAL  Final  Culture, blood (Routine X 2) w Reflex to ID Panel     Status: None  Collection Time: 05/28/23  8:41 AM   Specimen: BLOOD  Result Value Ref Range Status   Specimen Description BLOOD SITE NOT SPECIFIED  Final   Special Requests   Final    BOTTLES DRAWN AEROBIC AND ANAEROBIC Blood Culture results may not be optimal due to an inadequate volume of blood received in culture bottles   Culture   Final    NO GROWTH 5 DAYS Performed at [facility], [address] [place], [place] [address]    Report Status 06/02/2023 FINAL  Final     Radiology Studies: [place] EKG SITE RITE Result Date: 06/03/2023 If Site Rite image not attached, placement could not be confirmed due to current cardiac rhythm.  ECHO TEE Result Date: 06/02/2023    TRANSESOPHOGEAL ECHO REPORT   Patient Name:   Denise Kelly Date of Exam: 06/02/2023 Medical Rec #:  [id number]       Height:       67.0 in Accession #:    [id number]      Weight:       218.3 lb Date of Birth:  1940-06-03       BSA:          2.099 m Patient Age:    82 years         BP:           135/69 mmHg Patient Gender: F               HR:           83 bpm. Exam Location:  Inpatient Procedure: Transesophageal Echo, Color Doppler and Cardiac Doppler (Both            Spectral and Color Flow Doppler were utilized during procedure). Indications:     Endocarditis  History:         Patient has prior history of Echocardiogram examinations, most                  recent 05/28/2023.  Sonographer:     Harriette Bouillon RDCS Referring Phys:  [id number] [facility] Diagnosing Phys: Thomasene Ripple DO PROCEDURE: The transesophogeal probe was passed without difficulty through the esophogus of the patient. Sedation performed by different physician. The patient developed no complications during the procedure.  IMPRESSIONS  1. Left ventricular ejection fraction, by estimation, is 55 to 60%. The left ventricle has normal function.  2. There is an echodense mass on the wall of the right ventricular cavity. Right ventricular systolic function is normal. The right ventricular size is normal.  3. No left atrial/left atrial appendage thrombus was detected.  4. Small mobile echodensity in the right atrium not consistent with charateristics of normal variant right atrial structures.  5. A small pericardial effusion is present. The pericardial effusion is circumferential.  6. Echodense mass (1.13 cm x 0.528 cm) on the anterior mitral leaflet. The mitral valve is abnormal. Mild to moderate mitral valve regurgitation. No evidence of mitral stenosis.  7. The tricuspid valve is abnormal.  8. The aortic valve is tricuspid. Aortic valve regurgitation is trivial. Aortic valve sclerosis is present, with no evidence of aortic valve stenosis. Conclusion(s)/Recommendation(s): Findings are concerning for vegetation/infective endocarditis as detailed above. Findings concerning for mitral valve vegetation. FINDINGS  Left Ventricle: Left ventricular ejection fraction, by estimation, is 55 to 60%. The left ventricle has normal function.  The left ventricular internal cavity size was small. Right Ventricle: There is an echodense mass on the wall of the right  ventricular cavity. The right ventricular size is normal. No increase in right ventricular wall thickness. Right ventricular systolic function is normal. Left Atrium: Left atrial size was normal in size. No left atrial/left atrial appendage thrombus was detected. Right Atrium: Small mobile echodensity in the right atrium not consistent with charateristics of normal variant right atrial structures. Right atrial size was normal in size. Pericardium: A small pericardial effusion is present. The pericardial effusion is circumferential. Mitral Valve: Echodense mass (1.13 cm x 0.528 cm) on the anterior mitral leaflet. The mitral valve is abnormal. Mild to moderate mitral valve regurgitation. No evidence of mitral valve stenosis. Tricuspid Valve: The tricuspid valve is abnormal. Tricuspid valve regurgitation is mild . No evidence of tricuspid stenosis. Aortic Valve: There is a mobile echodense mass (0.772 cm). The aortic valve is tricuspid. Aortic valve regurgitation is trivial. Aortic valve sclerosis is present, with no evidence of aortic valve stenosis. Pulmonic Valve: The pulmonic valve was not well visualized. Pulmonic valve regurgitation is not visualized. Aorta: There is minimal (Grade I) layered plaque involving the descending aorta. Venous: The left upper pulmonary vein, right upper pulmonary vein and right lower pulmonary vein are normal. A normal flow pattern is recorded from the left upper pulmonary vein and the right upper pulmonary vein.  LEFT VENTRICLE PLAX 2D LVOT diam:     1.90 cm LVOT Area:     2.84 cm   AORTA Ao Root diam: 3.20 cm Ao Asc diam:  3.30 cm  SHUNTS Systemic Diam: 1.90 cm Kardie Tobb DO Electronically signed by Thomasene Ripple DO Signature Date/Time: 06/02/2023/6:11:28 PM    Final    EP STUDY Result Date: 06/02/2023 See surgical note for result.   Scheduled Meds:   apixaban  5 mg Oral BID   bisacodyl  10 mg Oral Daily   Chlorhexidine Gluconate Cloth  6 each Topical Daily   gabapentin  200 mg Oral TID   hydrocerin   Topical BID   pramipexole  0.25 mg Oral TID   sodium chloride flush  10-40 mL Intracatheter Q12H   Continuous Infusions:  gentamicin 230 mg (06/03/23 1628)   vancomycin       LOS: 8 days   Hughie Closs, MD [facility]  06/04/2023, 8:38 AM   *Please note that this is a verbal dictation therefore any spelling or grammatical errors are due to the "[employer]agon [employer] One" system interpretation.  Please page via Amion and do not message via secure chat for urgent patient care matters. Secure chat can be used for non urgent patient care matters.  How to contact the [facility] Attending or Consulting provider 7A - 7P or covering provider during after hours 7P -7A, for this patient?  Check the care team in [facility] and look for a) attending/consulting TRH provider listed and b) the [facility] team listed. Page or secure chat 7A-7P. Log into [email or web address] and use [facility]'s universal password to access. If you do not have the password, please contact the hospital operator. Locate the [facility] provider you are looking for under [facility] and page to a number that you can be directly reached. If you still have difficulty reaching the provider, please page the [facility] (Director on Call) for the Hospitalists listed on amion for assistance.

## 2023-06-04 NOTE — TOC Progression Note (Signed)
 Transition of Care [facility]) - Progression Note    Patient Details  Name: Denise Kelly MRN: [id number] Date of Birth: 1940-10-15  Transition of [facility]) CM/SW Contact  Lonie Newsham A [place], LCSW Phone Number: 06/04/2023, 5:16 PM  Clinical Narrative:     CSW reached out to pt's [facility], left VM with contact information to reach out to CSW.   CSW followed up with [name] at [facility], provided update on pt's need for 6 weeks of IV Abx. Confirmed pt cannot DC back to [facility] with IV and that they cannot administer either. She stated that she would reach out to additional contacts she had for pt. Also informed CSW that pt is trying to arrange for another family member or friend to assist with HCPOA. CSW provided bed offers to [name] and she said she would reach out to pt and discuss options and see if she can assist with helping pt make choice.   Pt can DC once facility is chosen and authorization is approved.    TOC will continue to follow.   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                                       Social Determinants of Health (SDOH) Interventions SDOH Screenings   Food Insecurity: No Food Insecurity (05/27/2023)  Housing: Low Risk  (05/27/2023)  Transportation Needs: No Transportation Needs (05/27/2023)  Utilities: Not At Risk (05/27/2023)  Depression (PHQ2-9): Low Risk  (01/09/2019)  Social Connections: Socially Isolated (05/27/2023)  Tobacco Use: Medium Risk (05/26/2023)    Readmission Risk Interventions    04/08/2023    1:02 PM 04/06/2023    3:38 PM 05/02/2022   11:19 AM  Readmission Risk Prevention Plan  Transportation Screening Complete Complete Complete  PCP or Specialist Appt  within 3-5 Days Complete Complete   HRI or Home Care Consult Complete Complete   Social Work Consult for Recovery Care Planning/Counseling Complete Complete   Palliative Care Screening Complete Not Applicable   Medication Review Oceanographer) Complete Complete Complete  PCP or Specialist appointment within 3-5 days of discharge   Complete  HRI or Home Care Consult   Complete  SW Recovery Care/Counseling Consult   Complete  Palliative Care Screening   Not Applicable  Skilled Nursing Facility   Complete

## 2023-06-04 NOTE — Anesthesia Postprocedure Evaluation (Signed)
 Anesthesia Post Note  Patient: Denise Kelly  Procedure(s) Performed: TRANSESOPHAGEAL ECHOCARDIOGRAM     Patient location during evaluation: Cath Lab Anesthesia Type: MAC Level of consciousness: awake and alert Pain management: pain level controlled Vital Signs Assessment: post-procedure vital signs reviewed and stable Respiratory status: spontaneous breathing, nonlabored ventilation and respiratory function stable Cardiovascular status: stable and blood pressure returned to baseline Postop Assessment: no apparent nausea or vomiting Anesthetic complications: no   No notable events documented.                Prescilla Monger

## 2023-06-05 DIAGNOSIS — N3000 Acute cystitis without hematuria: Secondary | ICD-10-CM | POA: Diagnosis not present

## 2023-06-05 LAB — BASIC METABOLIC PANEL
Anion gap: 8 (ref 5–15)
BUN: 8 mg/dL (ref 8–23)
CO2: 28 mmol/L (ref 22–32)
Calcium: 8.2 mg/dL — ABNORMAL LOW (ref 8.9–10.3)
Chloride: 101 mmol/L (ref 98–111)
Creatinine, Ser: 0.75 mg/dL (ref 0.44–1.00)
GFR, Estimated: >60 mL/min (ref >60)
Glucose, Bld: 151 mg/dL — ABNORMAL HIGH (ref 70–99)
Potassium: 3.3 mmol/L — ABNORMAL LOW (ref 3.5–5.1)
Sodium: 137 mmol/L (ref 135–145)

## 2023-06-05 LAB — CBC
HCT: 39.8 % (ref 36.0–46.0)
Hemoglobin: 13.2 g/dL (ref 12.0–15.0)
MCH: 28.3 pg (ref 26.0–34.0)
MCHC: 33.2 g/dL (ref 30.0–36.0)
MCV: 85.2 fL (ref 80.0–100.0)
Platelets: 177 10*3/uL (ref 150–400)
RBC: 4.67 MIL/uL (ref 3.87–5.11)
RDW: 13.7 % (ref 11.5–15.5)
WBC: 6.8 10*3/uL (ref 4.0–10.5)
nRBC: 0 % (ref 0.0–0.2)

## 2023-06-05 LAB — GLUCOSE, CAPILLARY: Glucose-Capillary: 131 mg/dL — ABNORMAL HIGH (ref 70–99)

## 2023-06-05 LAB — GENTAMICIN LEVEL, TROUGH: Gentamicin Trough: <0.5 ug/mL — ABNORMAL LOW (ref 0.5–2.0)

## 2023-06-05 MED ORDER — GENTAMICIN SULFATE 40 MG/ML IJ SOLN
3.0000 mg/kg | INTRAVENOUS | Status: DC
  Administered 2023-06-05 – 2023-06-09 (×5): 230 mg via INTRAVENOUS
  Filled 2023-06-05 (×6): qty 5.75

## 2023-06-05 MED ORDER — POTASSIUM CHLORIDE CRYS ER 20 MEQ PO TBCR
40.0000 meq | EXTENDED_RELEASE_TABLET | ORAL | Status: AC
  Administered 2023-06-05 (×2): 40 meq via ORAL
  Filled 2023-06-05 (×2): qty 2

## 2023-06-05 NOTE — Progress Notes (Addendum)
 Mobility Specialist: Progress Note   06/05/23 1619  Mobility  Activity Transferred from chair to bed  Level of Assistance Contact guard assist, steadying assist  Assistive Device Front wheel walker  Activity Response Tolerated fair  Mobility Referral Yes  Mobility visit 1 Mobility  Mobility Specialist Start Time (ACUTE ONLY) 1605  Mobility Specialist Stop Time (ACUTE ONLY) 1615  Mobility Specialist Time Calculation (min) (ACUTE ONLY) 10 min    Pt requesting to get back to bed - received in chair. C/o her bottom hurting and feeling stiff. 2x STS attempts d/t fear of falling, 2nd attempt successful with minA. MinG for stand pivot transfer. Cg for bed mobility to assist BLEs. Left in bed with all needs met, call bell in reach.   Maurene Capes Mobility Specialist Please contact via SecureChat or Rehab office at [phone]

## 2023-06-05 NOTE — Plan of Care (Signed)

## 2023-06-05 NOTE — TOC Progression Note (Addendum)
 Transition of Care [facility]) - Progression Note    Patient Details  Name: Denise Kelly MRN: [id number] Date of Birth: 1940-07-24  Transition of Care [facility]) CM/SW Contact  Michaela Corner, [place] Phone Number: 06/05/2023, 11:55 AM  Clinical Narrative:   CSW called patients [facility] but could not leave a VM. The follow message played, "The mailbox is full and cannot accept any messages at this time. Goodbye." CSW will follow up at a later time.   3:33 PM CSW met patient at bedside and provided her medicare.gov bed offers for accepting SNFs. Patient did not like any of the bed offers. CSW will check with pending facilities to see if they can extend a bed offer. Patient stated she would like to go to [facility] if they can extend a bed offer. Patient stated she does not want CSW to contact her daughter, [name] and does not want [facility] knowing anything about her whereabouts.   TOC will continue to follow.    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                                       Social Determinants of Health (SDOH) Interventions SDOH Screenings   Food Insecurity: No Food Insecurity (05/27/2023)  Housing: Low Risk  (05/27/2023)  Transportation Needs: No Transportation Needs (05/27/2023)  Utilities: Not At Risk (05/27/2023)  Depression (PHQ2-9): Low Risk  (01/09/2019)  Social Connections: Socially Isolated (05/27/2023)  Tobacco Use: Medium Risk (05/26/2023)    Readmission Risk Interventions    04/08/2023    1:02 PM 04/06/2023    3:38 PM 05/02/2022   11:19 AM  Readmission Risk Prevention Plan  Transportation Screening Complete Complete Complete  PCP or Specialist Appt within 3-5 Days Complete Complete   HRI or Home Care  Consult Complete Complete   Social Work Consult for Recovery Care Planning/Counseling Complete Complete   Palliative Care Screening Complete Not Applicable   Medication Review Oceanographer) Complete Complete Complete  PCP or Specialist appointment within 3-5 days of discharge   Complete  HRI or Home Care Consult   Complete  SW Recovery Care/Counseling Consult   Complete  Palliative Care Screening   Not Applicable  Skilled Nursing Facility   Complete

## 2023-06-05 NOTE — Progress Notes (Signed)
 Pharmacy Antibiotic Note  Denise Kelly is a 83 y.o. female admitted on 05/26/2023 with MRSE, staph lugdunensis, and enterococcus faecalis bacteremia and endocarditis Pharmacy has been consulted for vancomycin + gentamicin dosing. 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 SCr stable at < 1.   Vancomycin levels today show a peak of 39 and a trough of 13 for a supratherapeutic AUC of 560.   Gentamicin level today was drawn at 12:35 pm (prior to dose): <0.5 ug/mL    Plan: Reduce Vancomycin to 1500 mg every 24 hours - Predicted AUC 480 Continue Gentamicin 230 mg IV q 24 hours - 06/05/23 trough <0.5 ug/mL Monitor renal function  Plan 6 weeks of therapy    Height: 5\' 7"  (170.2 cm) Weight: 100.7 kg (222 lb 0.1 oz) IBW/kg (Calculated) : 61.6  Temp (24hrs), Avg:98 F (36.7 C), Min:97.3 F (36.3 C), Max:98.3 F (36.8 C)  Recent Labs  Lab 05/30/23 2056 05/31/23 0729 06/01/23 0819 06/02/23 0526 06/03/23 0758 06/03/23 1326 06/05/23 0325 06/05/23 1235  WBC  --  6.3 7.0 6.8 7.8  --  6.8  --   CREATININE  --  0.85 0.64 0.59 0.62  --  0.75  --   VANCOTROUGH  --   --   --   --  13*  --   --   --   VANCOPEAK 17*  --   --   --   --  39  --   --   GENTTROUGH  --   --   --   --   --   --   --  <0.5*    Estimated Creatinine Clearance: 67.2 mL/min (by C-G formula based on SCr of 0.75 mg/dL).    Allergies  Allergen Reactions   Sulfa Antibiotics Hives, Swelling and Other (See Comments)    Facial/eye swelling    Coreg [Carvedilol] Other (See Comments)    Memory loss   Motrin [Ibuprofen] Palpitations   Toprol Xl [Metoprolol Tartrate] Cough   Doxycycline Hyclate Other (See Comments)    HEARTBURN   Flovent Hfa [Fluticasone] Itching   Statins Other (See Comments)    MENTAL STATUS CHANGE     Thank you for involving pharmacy in the patient's care.    Yvonne Kendall Lamonte Sakai, PharmD, BCPS  Clinical Pharmacist  Infectious Diseases  available via secure chat

## 2023-06-05 NOTE — Progress Notes (Signed)
 Mobility Specialist: Progress Note   06/05/23 1532  Mobility  Activity Transferred from bed to chair  Level of Assistance Contact guard assist, steadying assist  Assistive Device Front wheel walker  Activity Response Tolerated well  Mobility Referral Yes  Mobility visit 1 Mobility  Mobility Specialist Start Time (ACUTE ONLY) 1345  Mobility Specialist Stop Time (ACUTE ONLY) 1406  Mobility Specialist Time Calculation (min) (ACUTE ONLY) 21 min    Pt was agreeable to mobility session - received in bed. CG for bed mobility to assist with trunk elevation. MinA for STS, very fearful of falling upon initial stand. MinG for stand pivot to chair. No complaints. Left in chair with all needs met, call bell in reach.   Maurene Capes Mobility Specialist Please contact via SecureChat or Rehab office at [phone]

## 2023-06-05 NOTE — Progress Notes (Signed)
 PROGRESS NOTE    Denise Kelly  PIR:[id number] DOB: [birth year] DOA: 05/26/2023 PCP: Eloisa Northern, MD   Brief Narrative:  83 year old with history of alcohol use, anxiety, HTN came to the ED with confusion. Reports of weeping wound in the right lower extremity, found at her living facility sitting in feces and urine. In the ER, concerns of urinary tract infection, right lower lobe consolidation. She also reported diarrhea, reportedly has had norovirus going around in her facility. Eventually blood cultures grew multiple species with some concerns of possible contaminated therefore repeat surveillance cultures ordered. Infectious disease also consulted for their input. In terms of her diarrhea GI panel and C. difficile studies were negative. Echocardiogram is negative for vegetation. TEE recommended. 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  Seen by CT surgery, not a candidate for surgical repair.  ID recommended 6 weeks of antibiotics.  Details below.  Assessment & Plan:   Principal Problem:   Urinary tract infection Active Problems:   Bacteremia   Acute metabolic encephalopathy: Resolved.  Fully alert and oriented.  Multi organism bacteremia with concerns of endocarditis - Growing E faecalis, staph epi dermatitis and staph lugdunensis.cho without overt dysfunction/evidence of vegetation - TEE shows questionable echodense mass on multiple valves - CT surgery contacted - not candidate for valve replacement/debridement. -Repeat cultures remain negative to date - ID consulted - now on vancomycin/gentomycin - plan for 6 weeks of therapy; outpatient followup 4/7 with ID Dr Thedore Mins - E   Diarrhea, resolved - Norovirus cases reported at her facility.  GI panel and C. difficile is negative here.   Hypokalemia/hypocalcemia Potassium 3.3.  Replenish today.   Chronic diastolic CHF, 65% - Overall appears to be compensated.  Prior echocardiogram showing EF of 65%   Chronic venous  stasis lower extremity bilaterally - Recently treated 7 days of IV Rocephin about 5 weeks ago   GERD - PPI   PE, per history -Continue outpatient Eliquis   Generalized weakness: PT OT recommended SNF.  TOC working on placement.  [facility] is not willing to take her back.  Patient needs to select facility.  DVT prophylaxis: SCDs Start: 05/27/23 0138 Eliquis   Code Status: Full Code  Family Communication:  None present at bedside.  Plan of care discussed with patient in length and he/she verbalized understanding and agreed with it.  Status is: Inpatient Remains inpatient appropriate because: Pending placement, patient medically stable.   Estimated body mass index is 34.77 kg/m as calculated from the following:   Height as of this encounter: 5\' 7"  (1.702 m).   Weight as of this encounter: 100.7 kg.    Nutritional Assessment: Body mass index is 34.77 kg/m.Marland Kitchen Seen by dietician.  I agree with the assessment and plan as outlined below: Nutrition Status:        . Skin Assessment: I have examined the patient's skin and I agree with the wound assessment as performed by the wound care RN as outlined below:    Consultants:  ID and CTS  Procedures:  As above  Antimicrobials:  Anti-infectives (From admission, onward)    Start     Dose/Rate Route Frequency Ordered Stop   06/05/23 1400  gentamicin (GARAMYCIN) 230 mg in dextrose 5 % 50 mL IVPB        3 mg/kg  76.6 kg (Adjusted) 111.5 mL/hr over 30 Minutes Intravenous Every 24 hours 06/05/23 0745     06/04/23 0900  vancomycin (VANCOREADY) IVPB 1500 mg/300 mL  1,500 mg 150 mL/hr over 120 Minutes Intravenous Every 24 hours 06/03/23 1453     06/02/23 1445  gentamicin (GARAMYCIN) 230 mg in dextrose 5 % 50 mL IVPB  Status:  Discontinued        3 mg/kg  76.6 kg (Adjusted) 111.5 mL/hr over 30 Minutes Intravenous Every 24 hours 06/02/23 1348 06/05/23 0745   05/31/23 0900  vancomycin (VANCOREADY) IVPB 1750 mg/350 mL  Status:   Discontinued        1,750 mg 175 mL/hr over 120 Minutes Intravenous Every 24 hours 05/30/23 2148 06/03/23 1453   05/28/23 1800  vancomycin (VANCOREADY) IVPB 1250 mg/250 mL  Status:  Discontinued        1,250 mg 166.7 mL/hr over 90 Minutes Intravenous Every 24 hours 05/27/23 1707 05/28/23 1210   05/28/23 1800  Vancomycin (VANCOCIN) 1,250 mg in sodium chloride 0.9 % 250 mL IVPB  Status:  Discontinued        1,250 mg 166.7 mL/hr over 90 Minutes Intravenous Every 24 hours 05/28/23 1209 05/30/23 2148   05/27/23 2300  cefTRIAXone (ROCEPHIN) 1 g in sodium chloride 0.9 % 100 mL IVPB  Status:  Discontinued        1 g 200 mL/hr over 30 Minutes Intravenous Every 24 hours 05/27/23 0147 05/28/23 1112   05/27/23 1800  vancomycin (VANCOREADY) IVPB 2000 mg/400 mL        2,000 mg 200 mL/hr over 120 Minutes Intravenous  Once 05/27/23 1707 05/27/23 2026   05/26/23 2315  cefTRIAXone (ROCEPHIN) 1 g in sodium chloride 0.9 % 100 mL IVPB        1 g 200 mL/hr over 30 Minutes Intravenous  Once 05/26/23 2308 05/26/23 2346         Subjective: Seen and examined.  No complaints.  She has chosen [facility] she says.  I have conveyed this to the Child psychotherapist.  She is requesting to keep her here until Monday.  I informed her that we will start the process to go to SNF but since she is medically stable, we will need to discharge her when logistics are squared away.  Objective: Vitals:   06/04/23 1700 06/04/23 1937 06/05/23 0540 06/05/23 0729  BP: 136/84 138/76 118/65 136/73  Pulse: 92 92 85 92  Resp: 18 18 19 18   Temp: 98.3 F (36.8 C) 97.8 F (36.6 C) (!) 97.3 F (36.3 C) 98.3 F (36.8 C)  TempSrc:  Oral Oral   SpO2: 94% 95% 96% 95%  Weight:      Height:        Intake/Output Summary (Last 24 hours) at 06/05/2023 0827 Last data filed at 06/05/2023 0500 Gross per 24 hour  Intake 692.13 ml  Output 1100 ml  Net -407.87 ml   Filed Weights   05/27/23 0115 06/03/23 0355  Weight: 99 kg 100.7 kg     Examination:  General exam: Appears calm and comfortable  Respiratory system: Clear to auscultation. Respiratory effort normal. Cardiovascular system: S1 & S2 heard, RRR. No JVD, murmurs, rubs, gallops or clicks.  Trace pitting edema bilateral lower extremity. Gastrointestinal system: Abdomen is nondistended, soft and nontender. No organomegaly or masses felt. Normal bowel sounds heard. Central nervous system: Alert and oriented. No focal neurological deficits. Extremities: Symmetric 5 x 5 power. Skin: No rashes, lesions or ulcers.    Data Reviewed: I have personally reviewed following labs and imaging studies  CBC: Recent Labs  Lab 05/31/23 0729 06/01/23 0819 06/02/23 0526 06/03/23 0758 06/05/23 0325  WBC  6.3 7.0 6.8 7.8 6.8  HGB 14.5 14.8 13.7 15.1* 13.2  HCT 43.9 44.0 40.9 44.8 39.8  MCV 86.1 84.8 85.2 85.8 85.2  PLT 176 186 177 212 177   Basic Metabolic Panel: Recent Labs  Lab 05/30/23 0638 05/31/23 0729 06/01/23 0819 06/02/23 0526 06/03/23 0758 06/05/23 0325  NA 139 137 136 137 139 137  K 4.5 3.9 3.7 3.6 3.5 3.3*  CL 104 101 102 102 99 101  CO2 23 31 25 27 30 28   GLUCOSE 91 122* 127* 106* 123* 151*  BUN 8 12 8 8  7* 8  CREATININE 0.81 0.85 0.64 0.59 0.62 0.75  CALCIUM 8.7* 8.4* 8.4* 8.3* 8.9 8.2*  MG 1.9 1.8 1.9 1.9 1.8  --    GFR: Estimated Creatinine Clearance: 67.2 mL/min (by C-G formula based on SCr of 0.75 mg/dL). Liver Function Tests: No results for input(s): "AST", "ALT", "ALKPHOS", "BILITOT", "PROT", "ALBUMIN" in the last 168 hours. No results for input(s): "LIPASE", "AMYLASE" in the last 168 hours. No results for input(s): "AMMONIA" in the last 168 hours. Coagulation Profile: No results for input(s): "INR", "PROTIME" in the last 168 hours. Cardiac Enzymes: No results for input(s): "CKTOTAL", "CKMB", "CKMBINDEX", "TROPONINI" in the last 168 hours. BNP (last 3 results) No results for input(s): "PROBNP" in the last 8760 hours. HbA1C: No  results for input(s): "HGBA1C" in the last 72 hours. CBG: No results for input(s): "GLUCAP" in the last 168 hours. Lipid Profile: No results for input(s): "CHOL", "HDL", "LDLCALC", "TRIG", "CHOLHDL", "LDLDIRECT" in the last 72 hours. Thyroid Function Tests: No results for input(s): "TSH", "T4TOTAL", "FREET4", "T3FREE", "THYROIDAB" in the last 72 hours. Anemia Panel: No results for input(s): "VITAMINB12", "FOLATE", "FERRITIN", "TIBC", "IRON", "RETICCTPCT" in the last 72 hours. Sepsis Labs: No results for input(s): "PROCALCITON", "LATICACIDVEN" in the last 168 hours.  Recent Results (from the past 240 hours)  Culture, blood (Routine X 2) w Reflex to ID Panel     Status: Abnormal   Collection Time: 05/26/23  8:01 PM   Specimen: BLOOD  Result Value Ref Range Status   Specimen Description BLOOD LEFT ANTECUBITAL  Final   Special Requests   Final    BOTTLES DRAWN AEROBIC AND ANAEROBIC Blood Culture adequate volume   Culture  Setup Time   Final    GRAM POSITIVE COCCI IN CHAINS IN BOTH AEROBIC AND ANAEROBIC BOTTLES CRITICAL RESULT CALLED TO, READ BACK BY AND VERIFIED WITH: Chana Bode [id number] @1646  FH Performed at [facility], [address], [place], [place] [address]    Culture (A)  Final    ENTEROCOCCUS FAECALIS STAPHYLOCOCCUS LUGDUNENSIS STAPHYLOCOCCUS EPIDERMIDIS    Report Status 06/03/2023 FINAL  Final   Organism ID, Bacteria ENTEROCOCCUS FAECALIS  Final   Organism ID, Bacteria STAPHYLOCOCCUS EPIDERMIDIS  Final   Organism ID, Bacteria STAPHYLOCOCCUS LUGDUNENSIS  Final      Susceptibility   Enterococcus faecalis - MIC*    AMPICILLIN <=2 SENSITIVE Sensitive     VANCOMYCIN 1 SENSITIVE Sensitive     GENTAMICIN SYNERGY SENSITIVE Sensitive     * ENTEROCOCCUS FAECALIS   Staphylococcus epidermidis - MIC*    CIPROFLOXACIN >=8 RESISTANT Resistant     ERYTHROMYCIN <=0.25 SENSITIVE Sensitive     GENTAMICIN <=0.5 SENSITIVE Sensitive     OXACILLIN >=4 RESISTANT Resistant      TETRACYCLINE <=1 SENSITIVE Sensitive     VANCOMYCIN 1 SENSITIVE Sensitive     TRIMETH/SULFA 20 SENSITIVE Sensitive     CLINDAMYCIN <=0.25 SENSITIVE Sensitive  RIFAMPIN <=0.5 SENSITIVE Sensitive     Inducible Clindamycin NEGATIVE Sensitive     * STAPHYLOCOCCUS EPIDERMIDIS   Staphylococcus lugdunensis - MIC*    CIPROFLOXACIN <=0.5 SENSITIVE Sensitive     ERYTHROMYCIN <=0.25 SENSITIVE Sensitive     GENTAMICIN <=0.5 SENSITIVE Sensitive     OXACILLIN >=4 RESISTANT Resistant     TETRACYCLINE <=1 SENSITIVE Sensitive     VANCOMYCIN <=0.5 SENSITIVE Sensitive     TRIMETH/SULFA <=10 SENSITIVE Sensitive     CLINDAMYCIN <=0.25 SENSITIVE Sensitive     RIFAMPIN <=0.5 SENSITIVE Sensitive     Inducible Clindamycin NEGATIVE Sensitive     * STAPHYLOCOCCUS LUGDUNENSIS  Blood Culture ID Panel (Reflexed)     Status: Abnormal   Collection Time: 05/26/23  8:01 PM  Result Value Ref Range Status   Enterococcus faecalis DETECTED (A) NOT DETECTED Final    Comment: CRITICAL RESULT CALLED TO, READ BACK BY AND VERIFIED WITH: PHARMD Dollene Cleveland @1646  FH    Enterococcus Faecium NOT DETECTED NOT DETECTED Final   Listeria monocytogenes NOT DETECTED NOT DETECTED Final   Staphylococcus species DETECTED (A) NOT DETECTED Final    Comment: CRITICAL RESULT CALLED TO, READ BACK BY AND VERIFIED WITH: PHARMD Dollene Cleveland @1646  FH    Staphylococcus aureus (BCID) NOT DETECTED NOT DETECTED Final   Staphylococcus epidermidis DETECTED (A) NOT DETECTED Final    Comment: Methicillin (oxacillin) resistant coagulase negative staphylococcus. Possible blood culture contaminant (unless isolated from more than one blood culture draw or clinical case suggests pathogenicity). No antibiotic treatment is indicated for blood  culture contaminants. CRITICAL RESULT CALLED TO, READ BACK BY AND VERIFIED WITH: PHARMD Dollene Cleveland @1646  FH    Staphylococcus lugdunensis DETECTED (A) NOT DETECTED Final    Comment:  Methicillin (oxacillin) resistant coagulase negative staphylococcus. Possible blood culture contaminant (unless isolated from more than one blood culture draw or clinical case suggests pathogenicity). No antibiotic treatment is indicated for blood  culture contaminants. CRITICAL RESULT CALLED TO, READ BACK BY AND VERIFIED WITH: PHARMD Dollene Cleveland @1646  FH    Streptococcus species NOT DETECTED NOT DETECTED Final   Streptococcus agalactiae NOT DETECTED NOT DETECTED Final   Streptococcus pneumoniae NOT DETECTED NOT DETECTED Final   Streptococcus pyogenes NOT DETECTED NOT DETECTED Final   A.calcoaceticus-baumannii NOT DETECTED NOT DETECTED Final   Bacteroides fragilis NOT DETECTED NOT DETECTED Final   Enterobacterales NOT DETECTED NOT DETECTED Final   Enterobacter cloacae complex NOT DETECTED NOT DETECTED Final   Escherichia coli NOT DETECTED NOT DETECTED Final   Klebsiella aerogenes NOT DETECTED NOT DETECTED Final   Klebsiella oxytoca NOT DETECTED NOT DETECTED Final   Klebsiella pneumoniae NOT DETECTED NOT DETECTED Final   Proteus species NOT DETECTED NOT DETECTED Final   Salmonella species NOT DETECTED NOT DETECTED Final   Serratia marcescens NOT DETECTED NOT DETECTED Final   Haemophilus influenzae NOT DETECTED NOT DETECTED Final   Neisseria meningitidis NOT DETECTED NOT DETECTED Final   Pseudomonas aeruginosa NOT DETECTED NOT DETECTED Final   Stenotrophomonas maltophilia NOT DETECTED NOT DETECTED Final   Candida albicans NOT DETECTED NOT DETECTED Final   Candida auris NOT DETECTED NOT DETECTED Final   Candida glabrata NOT DETECTED NOT DETECTED Final   Candida krusei NOT DETECTED NOT DETECTED Final   Candida parapsilosis NOT DETECTED NOT DETECTED Final   Candida tropicalis NOT DETECTED NOT DETECTED Final   Cryptococcus neoformans/gattii NOT DETECTED NOT DETECTED Final   Methicillin resistance mecA/C DETECTED (A) NOT DETECTED Final  Comment: CRITICAL RESULT CALLED TO, READ BACK  BY AND VERIFIED WITH: PHARMD Dollene Cleveland @1646  FH    Vancomycin resistance NOT DETECTED NOT DETECTED Final    Comment: Performed at [facility], [address] [place], [place] [address]  Culture, blood (Routine X 2) w Reflex to ID Panel     Status: None   Collection Time: 05/26/23  8:07 PM   Specimen: BLOOD LEFT ARM  Result Value Ref Range Status   Specimen Description BLOOD LEFT ARM  Final   Special Requests   Final    BOTTLES DRAWN AEROBIC ONLY Blood Culture results may not be optimal due to an inadequate volume of blood received in culture bottles   Culture   Final    NO GROWTH 5 DAYS Performed at [facility], [address] [place], [place] [address]    Report Status 05/31/2023 FINAL  Final  C Difficile Quick Screen w PCR reflex     Status: None   Collection Time: 05/27/23  1:39 AM   Specimen: STOOL  Result Value Ref Range Status   C Diff antigen NEGATIVE NEGATIVE Final   C Diff toxin NEGATIVE NEGATIVE Final   C Diff interpretation No C. difficile detected.  Final    Comment: Performed at [facility], [address] [place], [place] [address]  Gastrointestinal Panel by PCR , Stool     Status: None   Collection Time: 05/27/23  1:39 AM   Specimen: STOOL  Result Value Ref Range Status   Campylobacter species NOT DETECTED NOT DETECTED Final   Plesimonas shigelloides NOT DETECTED NOT DETECTED Final   Salmonella species NOT DETECTED NOT DETECTED Final   Yersinia enterocolitica NOT DETECTED NOT DETECTED Final   Vibrio species NOT DETECTED NOT DETECTED Final   Vibrio cholerae NOT DETECTED NOT DETECTED Final   Enteroaggregative E coli (EAEC) NOT DETECTED NOT DETECTED Final   Enteropathogenic E coli (EPEC) NOT DETECTED NOT DETECTED Final   Enterotoxigenic E coli (ETEC) NOT DETECTED NOT DETECTED Final   Shiga like toxin producing E coli (STEC) NOT DETECTED NOT DETECTED Final   Shigella/Enteroinvasive E coli (EIEC) NOT DETECTED NOT DETECTED Final    Cryptosporidium NOT DETECTED NOT DETECTED Final   Cyclospora cayetanensis NOT DETECTED NOT DETECTED Final   Entamoeba histolytica NOT DETECTED NOT DETECTED Final   Giardia lamblia NOT DETECTED NOT DETECTED Final   Adenovirus F40/41 NOT DETECTED NOT DETECTED Final   Astrovirus NOT DETECTED NOT DETECTED Final   Norovirus GI/GII NOT DETECTED NOT DETECTED Final   Rotavirus A NOT DETECTED NOT DETECTED Final   Sapovirus (I, II, IV, and V) NOT DETECTED NOT DETECTED Final    Comment: Performed at [facility], [address] [place], [place] [address]  Culture, blood (Routine X 2) w Reflex to ID Panel     Status: None   Collection Time: 05/28/23  8:41 AM   Specimen: BLOOD  Result Value Ref Range Status   Specimen Description BLOOD SITE NOT SPECIFIED  Final   Special Requests   Final    BOTTLES DRAWN AEROBIC AND ANAEROBIC Blood Culture results may not be optimal due to an inadequate volume of blood received in culture bottles   Culture   Final    NO GROWTH 5 DAYS Performed at [facility], [address] [place], [place] [address]    Report Status 06/02/2023 FINAL  Final  Culture, blood (Routine X 2) w Reflex to ID  Panel     Status: None   Collection Time: 05/28/23  8:41 AM   Specimen: BLOOD  Result Value Ref Range Status   Specimen Description BLOOD SITE NOT SPECIFIED  Final   Special Requests   Final    BOTTLES DRAWN AEROBIC AND ANAEROBIC Blood Culture results may not be optimal due to an inadequate volume of blood received in culture bottles   Culture   Final    NO GROWTH 5 DAYS Performed at [facility], [address], [place], [place] [address]    Report Status 06/02/2023 FINAL  Final     Radiology Studies: No results found.   Scheduled Meds:  apixaban  5 mg Oral BID   bisacodyl  10 mg Oral Daily   Chlorhexidine Gluconate Cloth  6 each Topical Daily   gabapentin  200 mg Oral TID   hydrocerin   Topical BID   potassium chloride  40 mEq Oral Q4H    pramipexole  0.25 mg Oral TID   sodium chloride flush  10-40 mL Intracatheter Q12H   Continuous Infusions:  gentamicin     vancomycin Stopped (06/04/23 1045)     LOS: 9 days   Hughie Closs, MD [facility]  06/05/2023, 8:27 AM   *Please note that this is a verbal dictation therefore any spelling or grammatical errors are due to the "[employer] One" system interpretation.  Please page via Amion and do not message via secure chat for urgent patient care matters. Secure chat can be used for non urgent patient care matters.  How to contact the [facility] Attending or Consulting provider 7A - 7P or covering provider during after hours 7P -7A, for this patient?  Check the care team in [facility] and look for a) attending/consulting [facility] provider listed and b) the [facility] team listed. Page or secure chat 7A-7P. Log into [email or web address] and use [place]'s universal password to access. If you do not have the password, please contact the hospital operator. Locate the [facility] provider you are looking for under [facility] and page to a number that you can be directly reached. If you still have difficulty reaching the provider, please page the [facility] (Director on Call) for the Hospitalists listed on amion for assistance.

## 2023-06-06 DIAGNOSIS — N3000 Acute cystitis without hematuria: Secondary | ICD-10-CM | POA: Diagnosis not present

## 2023-06-06 LAB — BASIC METABOLIC PANEL
Anion gap: 9 (ref 5–15)
BUN: 7 mg/dL — ABNORMAL LOW (ref 8–23)
CO2: 31 mmol/L (ref 22–32)
Calcium: 8.4 mg/dL — ABNORMAL LOW (ref 8.9–10.3)
Chloride: 98 mmol/L (ref 98–111)
Creatinine, Ser: 0.65 mg/dL (ref 0.44–1.00)
GFR, Estimated: >60 mL/min (ref >60)
Glucose, Bld: 95 mg/dL (ref 70–99)
Potassium: 3.5 mmol/L (ref 3.5–5.1)
Sodium: 138 mmol/L (ref 135–145)

## 2023-06-06 NOTE — Progress Notes (Signed)
 PROGRESS NOTE    Denise Kelly  ZOX:[id number] DOB: Dec 16, 1940 DOA: 05/26/2023 PCP: Eloisa Northern, MD   Brief Narrative:  83 year old with history of alcohol use, anxiety, HTN came to the ED with confusion. Reports of weeping wound in the right lower extremity, found at her living facility sitting in feces and urine. In the ER, concerns of urinary tract infection, right lower lobe consolidation. She also reported diarrhea, reportedly has had norovirus going around in her facility. Eventually blood cultures grew multiple species with some concerns of possible contaminated therefore repeat surveillance cultures ordered. Infectious disease also consulted for their input. In terms of her diarrhea GI panel and C. difficile studies were negative. Echocardiogram is negative for vegetation. TEE recommended. 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  Seen by CT surgery, not a candidate for surgical repair.  ID recommended 6 weeks of antibiotics.  Pending placement at the moment.  Details below.  Assessment & Plan:   Principal Problem:   Urinary tract infection Active Problems:   Bacteremia   Acute metabolic encephalopathy: Resolved.  Fully alert and oriented.  Multi organism bacteremia with concerns of endocarditis - Growing E faecalis, staph epi dermatitis and staph lugdunensis.cho without overt dysfunction/evidence of vegetation - TEE shows questionable echodense mass on multiple valves - CT surgery contacted - not candidate for valve replacement/debridement. -Repeat cultures remain negative to date - ID consulted - now on vancomycin/gentomycin - plan for 6 weeks of therapy/EOT 07/13/2023; outpatient followup 4/7 with ID Dr Thedore Mins - E   Diarrhea, resolved - Norovirus cases reported at her facility.  GI panel and C. difficile is negative here.   Hypokalemia/hypocalcemia Replenished.   Chronic diastolic CHF, 65% - Overall appears to be compensated.  Prior echocardiogram showing EF of  65%   Chronic venous stasis lower extremity bilaterally - Recently treated 7 days of IV Rocephin about 5 weeks ago   GERD - PPI   PE, per history -Continue outpatient Eliquis   Generalized weakness: PT OT recommended SNF.  TOC working on placement.  [facility] is not willing to take her back.  Reportedly, TOC has been having difficulties getting hold of her daughter Denise Kelly who is the healthcare power of attorney but yesterday, patient prohibited TOC staff to contact her and provide her any information.  Patient needs to select facility.  DVT prophylaxis: SCDs Start: 05/27/23 0138 Eliquis   Code Status: Full Code  Family Communication:  None present at bedside.  Plan of care discussed with patient in length and he/she verbalized understanding and agreed with it.  Status is: Inpatient Remains inpatient appropriate because: Pending placement, patient medically stable.   Estimated body mass index is 33.67 kg/m as calculated from the following:   Height as of this encounter: 5\' 7"  (1.702 m).   Weight as of this encounter: 97.5 kg.    Nutritional Assessment: Body mass index is 33.67 kg/m.Marland Kitchen Seen by dietician.  I agree with the assessment and plan as outlined below: Nutrition Status:        . Skin Assessment: I have examined the patient's skin and I agree with the wound assessment as performed by the wound care RN as outlined below:    Consultants:  ID and CTS  Procedures:  As above  Antimicrobials:  Anti-infectives (From admission, onward)    Start     Dose/Rate Route Frequency Ordered Stop   06/05/23 1400  gentamicin (GARAMYCIN) 230 mg in dextrose 5 % 50 mL IVPB  3 mg/kg  76.6 kg (Adjusted) 111.5 mL/hr over 30 Minutes Intravenous Every 24 hours 06/05/23 0745     06/04/23 0900  vancomycin (VANCOREADY) IVPB 1500 mg/300 mL        1,500 mg 150 mL/hr over 120 Minutes Intravenous Every 24 hours 06/03/23 1453     06/02/23 1445  gentamicin (GARAMYCIN) 230 mg in  dextrose 5 % 50 mL IVPB  Status:  Discontinued        3 mg/kg  76.6 kg (Adjusted) 111.5 mL/hr over 30 Minutes Intravenous Every 24 hours 06/02/23 1348 06/05/23 0745   05/31/23 0900  vancomycin (VANCOREADY) IVPB 1750 mg/350 mL  Status:  Discontinued        1,750 mg 175 mL/hr over 120 Minutes Intravenous Every 24 hours 05/30/23 2148 06/03/23 1453   05/28/23 1800  vancomycin (VANCOREADY) IVPB 1250 mg/250 mL  Status:  Discontinued        1,250 mg 166.7 mL/hr over 90 Minutes Intravenous Every 24 hours 05/27/23 1707 05/28/23 1210   05/28/23 1800  Vancomycin (VANCOCIN) 1,250 mg in sodium chloride 0.9 % 250 mL IVPB  Status:  Discontinued        1,250 mg 166.7 mL/hr over 90 Minutes Intravenous Every 24 hours 05/28/23 1209 05/30/23 2148   05/27/23 2300  cefTRIAXone (ROCEPHIN) 1 g in sodium chloride 0.9 % 100 mL IVPB  Status:  Discontinued        1 g 200 mL/hr over 30 Minutes Intravenous Every 24 hours 05/27/23 0147 05/28/23 1112   05/27/23 1800  vancomycin (VANCOREADY) IVPB 2000 mg/400 mL        2,000 mg 200 mL/hr over 120 Minutes Intravenous  Once 05/27/23 1707 05/27/23 2026   05/26/23 2315  cefTRIAXone (ROCEPHIN) 1 g in sodium chloride 0.9 % 100 mL IVPB        1 g 200 mL/hr over 30 Minutes Intravenous  Once 05/26/23 2308 05/26/23 2346         Subjective: Patient seen and examined.  She has no complaints.  Now she says that she wants to go home.  I will have social worker talk to her.  She is not safe to discharge home by herself with this much of debility.  Objective: Vitals:   06/05/23 1504 06/05/23 2059 06/06/23 0446 06/06/23 0728  BP: 134/72 135/74 134/73 124/78  Pulse: 92 (!) 102 88 99  Resp:  20 20 14   Temp: 98 F (36.7 C) 98.1 F (36.7 C) 98.1 F (36.7 C) (!) 97.5 F (36.4 C)  TempSrc:  Oral Oral Oral  SpO2: 93% 94% 95% 96%  Weight:   97.5 kg   Height:        Intake/Output Summary (Last 24 hours) at 06/06/2023 0745 Last data filed at 06/06/2023 0500 Gross per 24 hour   Intake 830.94 ml  Output 1550 ml  Net -719.06 ml   Filed Weights   05/27/23 0115 06/03/23 0355 06/06/23 0446  Weight: 99 kg 100.7 kg 97.5 kg    Examination:  General exam: Appears calm and comfortable  Respiratory system: Clear to auscultation. Respiratory effort normal. Cardiovascular system: S1 & S2 heard, RRR. No JVD, murmurs, rubs, gallops or clicks.  +1 pitting edema bilateral lower extremity. Gastrointestinal system: Abdomen is nondistended, soft and nontender. No organomegaly or masses felt. Normal bowel sounds heard. Central nervous system: Alert and oriented. No focal neurological deficits. Extremities: Symmetric 5 x 5 power. Skin: No rashes, lesions or ulcers.   Data Reviewed: I have personally reviewed following  labs and imaging studies  CBC: Recent Labs  Lab 05/31/23 0729 06/01/23 0819 06/02/23 0526 06/03/23 0758 06/05/23 0325  WBC 6.3 7.0 6.8 7.8 6.8  HGB 14.5 14.8 13.7 15.1* 13.2  HCT 43.9 44.0 40.9 44.8 39.8  MCV 86.1 84.8 85.2 85.8 85.2  PLT 176 186 177 212 177   Basic Metabolic Panel: Recent Labs  Lab 05/31/23 0729 06/01/23 0819 06/02/23 0526 06/03/23 0758 06/05/23 0325  NA 137 136 137 139 137  K 3.9 3.7 3.6 3.5 3.3*  CL 101 102 102 99 101  CO2 31 25 27 30 28   GLUCOSE 122* 127* 106* 123* 151*  BUN 12 8 8  7* 8  CREATININE 0.85 0.64 0.59 0.62 0.75  CALCIUM 8.4* 8.4* 8.3* 8.9 8.2*  MG 1.8 1.9 1.9 1.8  --    GFR: Estimated Creatinine Clearance: 66.2 mL/min (by C-G formula based on SCr of 0.75 mg/dL). Liver Function Tests: No results for input(s): "AST", "ALT", "ALKPHOS", "BILITOT", "PROT", "ALBUMIN" in the last 168 hours. No results for input(s): "LIPASE", "AMYLASE" in the last 168 hours. No results for input(s): "AMMONIA" in the last 168 hours. Coagulation Profile: No results for input(s): "INR", "PROTIME" in the last 168 hours. Cardiac Enzymes: No results for input(s): "CKTOTAL", "CKMB", "CKMBINDEX", "TROPONINI" in the last 168  hours. BNP (last 3 results) No results for input(s): "PROBNP" in the last 8760 hours. HbA1C: No results for input(s): "HGBA1C" in the last 72 hours. CBG: Recent Labs  Lab 06/05/23 1539  GLUCAP 131*   Lipid Profile: No results for input(s): "CHOL", "HDL", "LDLCALC", "TRIG", "CHOLHDL", "LDLDIRECT" in the last 72 hours. Thyroid Function Tests: No results for input(s): "TSH", "T4TOTAL", "FREET4", "T3FREE", "THYROIDAB" in the last 72 hours. Anemia Panel: No results for input(s): "VITAMINB12", "FOLATE", "FERRITIN", "TIBC", "IRON", "RETICCTPCT" in the last 72 hours. Sepsis Labs: No results for input(s): "PROCALCITON", "LATICACIDVEN" in the last 168 hours.  Recent Results (from the past 240 hours)  Culture, blood (Routine X 2) w Reflex to ID Panel     Status: None   Collection Time: 05/28/23  8:41 AM   Specimen: BLOOD  Result Value Ref Range Status   Specimen Description BLOOD SITE NOT SPECIFIED  Final   Special Requests   Final    BOTTLES DRAWN AEROBIC AND ANAEROBIC Blood Culture results may not be optimal due to an inadequate volume of blood received in culture bottles   Culture   Final    NO GROWTH 5 DAYS Performed at [facility], [address], [place], [place] [address]    Report Status 06/02/2023 FINAL  Final  Culture, blood (Routine X 2) w Reflex to ID Panel     Status: None   Collection Time: 05/28/23  8:41 AM   Specimen: BLOOD  Result Value Ref Range Status   Specimen Description BLOOD SITE NOT SPECIFIED  Final   Special Requests   Final    BOTTLES DRAWN AEROBIC AND ANAEROBIC Blood Culture results may not be optimal due to an inadequate volume of blood received in culture bottles   Culture   Final    NO GROWTH 5 DAYS Performed at [facility], [address], [place], [place] [address]    Report Status 06/02/2023 FINAL  Final     Radiology Studies: No results found.   Scheduled Meds:  apixaban  5 mg Oral BID   bisacodyl  10 mg Oral Daily    Chlorhexidine Gluconate Cloth  6 each Topical Daily  gabapentin  200 mg Oral TID   hydrocerin   Topical BID   pramipexole  0.25 mg Oral TID   sodium chloride flush  10-40 mL Intracatheter Q12H   Continuous Infusions:  gentamicin Stopped (06/05/23 1410)   vancomycin Stopped (06/05/23 1054)     LOS: 10 days   Hughie Closs, MD [facility]  06/06/2023, 7:45 AM   *Please note that this is a verbal dictation therefore any spelling or grammatical errors are due to the "Dragon Medical One" system interpretation.  Please page via Amion and do not message via secure chat for urgent patient care matters. Secure chat can be used for non urgent patient care matters.  How to contact the [facility] Attending or Consulting provider 7A - 7P or covering provider during after hours 7P -7A, for this patient?  Check the care team in [facility] and look for a) attending/consulting TRH provider listed and b) the [facility] team listed. Page or secure chat 7A-7P. Log into [email or web address] and use 's universal password to access. If you do not have the password, please contact the hospital operator. Locate the [facility] provider you are looking for under [facility] and page to a number that you can be directly reached. If you still have difficulty reaching the provider, please page the [facility] (Director on Call) for the Hospitalists listed on amion for assistance.

## 2023-06-06 NOTE — Plan of Care (Signed)
  Problem: Education: Goal: Knowledge of General Education information will improve Description: Including pain rating scale, medication(s)/side effects and non-pharmacologic comfort measures 06/06/2023 0511 by Joen Laura, RN Outcome: Progressing 06/06/2023 0511 by Joen Laura, RN Outcome: Progressing   Problem: Health Behavior/Discharge Planning: Goal: Ability to manage health-related needs will improve 06/06/2023 0511 by Joen Laura, RN Outcome: Progressing 06/06/2023 0511 by Joen Laura, RN Outcome: Progressing   Problem: Clinical Measurements: Goal: Ability to maintain clinical measurements within normal limits will improve 06/06/2023 0511 by Joen Laura, RN Outcome: Progressing 06/06/2023 0511 by Joen Laura, RN Outcome: Progressing Goal: Will remain free from infection 06/06/2023 0511 by Joen Laura, RN Outcome: Progressing 06/06/2023 0511 by Joen Laura, RN Outcome: Progressing Goal: Diagnostic test results will improve 06/06/2023 0511 by Joen Laura, RN Outcome: Progressing 06/06/2023 0511 by Joen Laura, RN Outcome: Progressing Goal: Respiratory complications will improve 06/06/2023 0511 by Joen Laura, RN Outcome: Progressing 06/06/2023 0511 by Joen Laura, RN Outcome: Progressing Goal: Cardiovascular complication will be avoided 06/06/2023 0511 by Joen Laura, RN Outcome: Progressing 06/06/2023 0511 by Joen Laura, RN Outcome: Progressing   Problem: Activity: Goal: Risk for activity intolerance will decrease 06/06/2023 0511 by Joen Laura, RN Outcome: Progressing 06/06/2023 0511 by Joen Laura, RN Outcome: Progressing   Problem: Nutrition: Goal: Adequate nutrition will be maintained 06/06/2023 0511 by Joen Laura, RN Outcome: Progressing 06/06/2023 0511 by Joen Laura, RN Outcome: Progressing   Problem: Coping: Goal: Level of anxiety will decrease 06/06/2023 0511 by Joen Laura, RN Outcome: Progressing 06/06/2023 0511 by Joen Laura, RN Outcome: Progressing   Problem: Elimination: Goal: Will not experience complications related to bowel motility 06/06/2023 0511 by Joen Laura, RN Outcome: Progressing 06/06/2023 0511 by Joen Laura, RN Outcome: Progressing Goal: Will not experience complications related to urinary retention 06/06/2023 0511 by Joen Laura, RN Outcome: Progressing 06/06/2023 0511 by Joen Laura, RN Outcome: Progressing   Problem: Pain Managment: Goal: General experience of comfort will improve and/or be controlled 06/06/2023 0511 by Joen Laura, RN Outcome: Progressing 06/06/2023 0511 by Joen Laura, RN Outcome: Progressing   Problem: Safety: Goal: Ability to remain free from injury will improve 06/06/2023 0511 by Joen Laura, RN Outcome: Progressing 06/06/2023 0511 by Joen Laura, RN Outcome: Progressing   Problem: Skin Integrity: Goal: Risk for impaired skin integrity will decrease 06/06/2023 0511 by Joen Laura, RN Outcome: Progressing 06/06/2023 0511 by Joen Laura, RN Outcome: Progressing

## 2023-06-06 NOTE — Plan of Care (Signed)

## 2023-06-07 DIAGNOSIS — R7881 Bacteremia: Secondary | ICD-10-CM | POA: Diagnosis not present

## 2023-06-07 MED ORDER — NYSTATIN 100000 UNIT/GM EX POWD
Freq: Every day | CUTANEOUS | Status: DC
  Filled 2023-06-07: qty 15

## 2023-06-07 MED ORDER — POLYETHYLENE GLYCOL 3350 17 G PO PACK
17.0000 g | PACK | Freq: Every day | ORAL | Status: DC
  Administered 2023-06-07: 17 g via ORAL
  Filled 2023-06-07 (×4): qty 1

## 2023-06-07 MED ORDER — CAMPHOR-MENTHOL 0.5-0.5 % EX LOTN
TOPICAL_LOTION | CUTANEOUS | Status: DC | PRN
  Filled 2023-06-07: qty 222

## 2023-06-07 NOTE — Plan of Care (Signed)

## 2023-06-07 NOTE — Progress Notes (Signed)
 PROGRESS NOTE    Denise Kelly  VWU:[id number] DOB: 08/02/1940 DOA: 05/26/2023 PCP: Eloisa Northern, MD   Brief Narrative:  83 year old with history of alcohol use, anxiety, HTN came to the ED with confusion. Reports of weeping wound in the right lower extremity, found at her living facility sitting in feces and urine. In the ER, concerns of urinary tract infection, right lower lobe consolidation. She also reported diarrhea, reportedly has had norovirus going around in her facility. Eventually blood cultures grew multiple species with some concerns of possible contaminated therefore repeat surveillance cultures ordered. Infectious disease also consulted for their input. In terms of her diarrhea GI panel and C. difficile studies were negative. Echocardiogram is negative for vegetation. TEE recommended. 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  Seen by CT surgery, not a candidate for surgical repair.  ID recommended 6 weeks of antibiotics.  Pending placement at the moment.  Details below.  Assessment & Plan:   Principal Problem:   Urinary tract infection Active Problems:   Bacteremia   Acute metabolic encephalopathy: Resolved.  Fully alert and oriented.  Multi organism bacteremia with concerns of endocarditis - Growing E faecalis, staph epi dermatitis and staph lugdunensis.cho without overt dysfunction/evidence of vegetation - TEE shows questionable echodense mass on multiple valves - CT surgery contacted - not candidate for valve replacement/debridement. -Repeat cultures remain negative to date - ID consulted - now on vancomycin/gentomycin - plan for 6 weeks of therapy/EOT 07/13/2023; outpatient followup 4/7 with ID Dr Thedore Mins   Diarrhea, resolved - Norovirus cases reported at her facility.  GI panel and C. difficile is negative here.   Hypokalemia/hypocalcemia Replenished.   Chronic diastolic CHF, 65% - Overall appears to be compensated.  Prior echocardiogram showing EF of  65%   Chronic venous stasis lower extremity bilaterally - Recently treated 7 days of IV Rocephin about 5 weeks ago   GERD - PPI   PE, per history -Continue outpatient Eliquis   Generalized weakness: PT OT recommended SNF.  TOC working on placement.  [facility] is not willing to take her back.  Reportedly, TOC has been having difficulties getting hold of her daughter [name] who is the healthcare power of attorney but 3/21, patient prohibited TOC staff to contact her and provide her any information.  Patient needs to select facility.  DVT prophylaxis: SCDs Start: 05/27/23 0138 Eliquis   Code Status: Full Code  Family Communication:  None present at bedside.  Plan of care discussed with patient she is in agreement with care.   Status is: Inpatient Remains inpatient appropriate because: Pending placement, patient medically stable.   Estimated body mass index is 33.67 kg/m as calculated from the following:   Height as of this encounter: 5\' 7"  (1.702 m).   Weight as of this encounter: 97.5 kg.    Nutritional Assessment: Body mass index is 33.67 kg/m.Marland Kitchen Seen by dietician.  I agree with the assessment and plan as outlined below: Nutrition Status:     Consultants:  ID and CTS  Procedures:  As above  Antimicrobials:  Anti-infectives (From admission, onward)    Start     Dose/Rate Route Frequency Ordered Stop   06/05/23 1400  gentamicin (GARAMYCIN) 230 mg in dextrose 5 % 50 mL IVPB        3 mg/kg  76.6 kg (Adjusted) 111.5 mL/hr over 30 Minutes Intravenous Every 24 hours 06/05/23 0745     06/04/23 0900  vancomycin (VANCOREADY) IVPB 1500 mg/300 mL  1,500 mg 150 mL/hr over 120 Minutes Intravenous Every 24 hours 06/03/23 1453     06/02/23 1445  gentamicin (GARAMYCIN) 230 mg in dextrose 5 % 50 mL IVPB  Status:  Discontinued        3 mg/kg  76.6 kg (Adjusted) 111.5 mL/hr over 30 Minutes Intravenous Every 24 hours 06/02/23 1348 06/05/23 0745   05/31/23 0900  vancomycin  (VANCOREADY) IVPB 1750 mg/350 mL  Status:  Discontinued        1,750 mg 175 mL/hr over 120 Minutes Intravenous Every 24 hours 05/30/23 2148 06/03/23 1453   05/28/23 1800  vancomycin (VANCOREADY) IVPB 1250 mg/250 mL  Status:  Discontinued        1,250 mg 166.7 mL/hr over 90 Minutes Intravenous Every 24 hours 05/27/23 1707 05/28/23 1210   05/28/23 1800  Vancomycin (VANCOCIN) 1,250 mg in sodium chloride 0.9 % 250 mL IVPB  Status:  Discontinued        1,250 mg 166.7 mL/hr over 90 Minutes Intravenous Every 24 hours 05/28/23 1209 05/30/23 2148   05/27/23 2300  cefTRIAXone (ROCEPHIN) 1 g in sodium chloride 0.9 % 100 mL IVPB  Status:  Discontinued        1 g 200 mL/hr over 30 Minutes Intravenous Every 24 hours 05/27/23 0147 05/28/23 1112   05/27/23 1800  vancomycin (VANCOREADY) IVPB 2000 mg/400 mL        2,000 mg 200 mL/hr over 120 Minutes Intravenous  Once 05/27/23 1707 05/27/23 2026   05/26/23 2315  cefTRIAXone (ROCEPHIN) 1 g in sodium chloride 0.9 % 100 mL IVPB        1 g 200 mL/hr over 30 Minutes Intravenous  Once 05/26/23 2308 05/26/23 2346         Subjective: Patient feels she has some wheezing but denies shortness of breath.   Objective: Vitals:   06/06/23 2054 06/07/23 0438 06/07/23 0923 06/07/23 1218  BP: 124/88 125/73 109/74 118/70  Pulse: (!) 102 96 88 92  Resp: 20 20 20 19   Temp: (!) 97.3 F (36.3 C) (!) 97.4 F (36.3 C) (!) 95.5 F (35.3 C) 98 F (36.7 C)  TempSrc: Oral Oral    SpO2: 95% 96% 95% 93%  Weight:      Height:        Intake/Output Summary (Last 24 hours) at 06/07/2023 1620 Last data filed at 06/07/2023 1117 Gross per 24 hour  Intake 598.1 ml  Output 950 ml  Net -351.9 ml   Filed Weights   05/27/23 0115 06/03/23 0355 06/06/23 0446  Weight: 99 kg 100.7 kg 97.5 kg     Physical Exam  Constitutional: In no distress.  Cardiovascular: Normal rate, regular rhythm. 1+ edema of BLE  Pulmonary: Non labored breathing on room air, no wheezing or rales.    Abdominal: Soft. Normal bowel sounds. Non distended and non tender Musculoskeletal: BLE TTP  Neurological: Alert and oriented to person, place, and time. Non focal  Skin: Skin is warm and dry.    Data Reviewed: I have personally reviewed following labs and imaging studies  CBC: Recent Labs  Lab 06/01/23 0819 06/02/23 0526 06/03/23 0758 06/05/23 0325  WBC 7.0 6.8 7.8 6.8  HGB 14.8 13.7 15.1* 13.2  HCT 44.0 40.9 44.8 39.8  MCV 84.8 85.2 85.8 85.2  PLT 186 177 212 177   Basic Metabolic Panel: Recent Labs  Lab 06/01/23 0819 06/02/23 0526 06/03/23 0758 06/05/23 0325 06/06/23 0828  NA 136 137 139 137 138  K 3.7 3.6 3.5  3.3* 3.5  CL 102 102 99 101 98  CO2 25 27 30 28 31   GLUCOSE 127* 106* 123* 151* 95  BUN 8 8 7* 8 7*  CREATININE 0.64 0.59 0.62 0.75 0.65  CALCIUM 8.4* 8.3* 8.9 8.2* 8.4*  MG 1.9 1.9 1.8  --   --    GFR: Estimated Creatinine Clearance: 66.2 mL/min (by C-G formula based on SCr of 0.65 mg/dL). Liver Function Tests: No results for input(s): "AST", "ALT", "ALKPHOS", "BILITOT", "PROT", "ALBUMIN" in the last 168 hours. No results for input(s): "LIPASE", "AMYLASE" in the last 168 hours. No results for input(s): "AMMONIA" in the last 168 hours. Coagulation Profile: No results for input(s): "INR", "PROTIME" in the last 168 hours. Cardiac Enzymes: No results for input(s): "CKTOTAL", "CKMB", "CKMBINDEX", "TROPONINI" in the last 168 hours. BNP (last 3 results) No results for input(s): "PROBNP" in the last 8760 hours. HbA1C: No results for input(s): "HGBA1C" in the last 72 hours. CBG: Recent Labs  Lab 06/05/23 1539  GLUCAP 131*   Lipid Profile: No results for input(s): "CHOL", "HDL", "LDLCALC", "TRIG", "CHOLHDL", "LDLDIRECT" in the last 72 hours. Thyroid Function Tests: No results for input(s): "TSH", "T4TOTAL", "FREET4", "T3FREE", "THYROIDAB" in the last 72 hours. Anemia Panel: No results for input(s): "VITAMINB12", "FOLATE", "FERRITIN", "TIBC", "IRON",  "RETICCTPCT" in the last 72 hours. Sepsis Labs: No results for input(s): "PROCALCITON", "LATICACIDVEN" in the last 168 hours.  No results found for this or any previous visit (from the past 240 hours).    Radiology Studies: No results found.   Scheduled Meds:  apixaban  5 mg Oral BID   Chlorhexidine Gluconate Cloth  6 each Topical Daily   gabapentin  200 mg Oral TID   hydrocerin   Topical BID   polyethylene glycol  17 g Oral Daily   pramipexole  0.25 mg Oral TID   sodium chloride flush  10-40 mL Intracatheter Q12H   Continuous Infusions:  gentamicin 230 mg (06/07/23 1436)   vancomycin Stopped (06/07/23 1115)     LOS: 11 days   Marolyn Haller, MD [facility]  06/07/2023, 4:20 PM    Please page via [facility]ore[facility] Stapler and do not message via secure chat for urgent patient care matters. Secure chat can be used for non urgent patient care matters.  How to contact the [facility] Attending or Consulting provider 7A - 7P or covering provider during after hours 7P -7A, for this patient?  Check the care team in [facility] and look for a) attending/consulting TRH provider listed and b) the [facility] team listed. Page or secure chat 7A-7P. Log into [email or web address] and use [place] [facility]'s universal password to access. If you do not have the password, please contact the hospital operator. Locate the [facility] provider you are looking for under [facility] and page to a number that you can be directly reached. If you still have difficulty reaching the provider, please page the [facility] (Director on Call) for the Hospitalists listed on amion for assistance.

## 2023-06-08 DIAGNOSIS — N3000 Acute cystitis without hematuria: Secondary | ICD-10-CM | POA: Diagnosis not present

## 2023-06-08 LAB — CBC
HCT: 40 % (ref 36.0–46.0)
Hemoglobin: 13.1 g/dL (ref 12.0–15.0)
MCH: 28.1 pg (ref 26.0–34.0)
MCHC: 32.8 g/dL (ref 30.0–36.0)
MCV: 85.8 fL (ref 80.0–100.0)
Platelets: 175 10*3/uL (ref 150–400)
RBC: 4.66 MIL/uL (ref 3.87–5.11)
RDW: 13.9 % (ref 11.5–15.5)
WBC: 6.4 10*3/uL (ref 4.0–10.5)
nRBC: 0 % (ref 0.0–0.2)

## 2023-06-08 LAB — BASIC METABOLIC PANEL
Anion gap: 9 (ref 5–15)
BUN: 8 mg/dL (ref 8–23)
CO2: 29 mmol/L (ref 22–32)
Calcium: 8.5 mg/dL — ABNORMAL LOW (ref 8.9–10.3)
Chloride: 100 mmol/L (ref 98–111)
Creatinine, Ser: 0.64 mg/dL (ref 0.44–1.00)
GFR, Estimated: >60 mL/min (ref >60)
Glucose, Bld: 110 mg/dL — ABNORMAL HIGH (ref 70–99)
Potassium: 3.5 mmol/L (ref 3.5–5.1)
Sodium: 138 mmol/L (ref 135–145)

## 2023-06-08 NOTE — Progress Notes (Signed)
 PROGRESS NOTE    Denise Kelly  ZOX:[id number] DOB: Jun 11, 1940 DOA: 05/26/2023 PCP: Eloisa Northern, MD   Brief Narrative:  83 year old with history of alcohol use, anxiety, HTN came to the ED with confusion. Reports of weeping wound in the right lower extremity, found at her living facility sitting in feces and urine. In the ER, concerns of urinary tract infection, right lower lobe consolidation. She also reported diarrhea, reportedly has had norovirus going around in her facility. Eventually blood cultures grew multiple species with some concerns of possible contaminated therefore repeat surveillance cultures ordered. Infectious disease also consulted for their input. In terms of her diarrhea GI panel and C. difficile studies were negative. Echocardiogram is negative for vegetation. TEE recommended. 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  Seen by CT surgery, not a candidate for surgical repair.  ID recommended 6 weeks of antibiotics.  Pending placement at the moment.  Details below.  Assessment & Plan:   Principal Problem:   Urinary tract infection Active Problems:   Bacteremia   Acute metabolic encephalopathy: Resolved.  Fully alert and oriented.  Multi organism bacteremia with concerns of endocarditis - Growing E faecalis, staph epi dermatitis and staph lugdunensis.cho without overt dysfunction/evidence of vegetation - TEE showed questionable echodense mass on multiple valves - CT surgery contacted - not candidate for valve replacement/debridement. -Repeat cultures remain negative to date - ID consulted - now on vancomycin/gentomycin - plan for 6 weeks of therapy/EOT 07/13/2023 - outpatient followup 4/7 with ID Dr Thedore Mins   Diarrhea, resolved - Norovirus cases reported at her facility.  GI panel and C. difficile is negative here. - no further issues   Hypokalemia/hypocalcemia Replenished.   Chronic diastolic CHF, 65% - Overall appears to be compensated.  Prior  echocardiogram showing EF of 65%   Chronic venous stasis lower extremity bilaterally - Recently treated 7 days of IV Rocephin about 5 weeks ago   GERD - PPI   PE, per history -Continue outpatient Eliquis   Generalized weakness: PT OT recommended SNF.  TOC working on placement.  [facility] is not willing to take her back.  Reportedly, TOC has been having difficulties getting hold of her daughter [name] who is the healthcare power of attorney but 3/21, patient prohibited TOC staff to contact her and provide her any information.  Patient deciding on facility today.    DVT prophylaxis: SCDs Start: 05/27/23 0138 Eliquis   Code Status: Full Code  Family Communication:  None present at bedside.  Plan of care discussed with patient she is in agreement with care.  Doesn't want daughter involved with care plans currently.   Status is: Inpatient Remains inpatient appropriate because: Pending placement, patient medically stable.   Estimated body mass index is 33.67 kg/m as calculated from the following:   Height as of this encounter: 5\' 7"  (1.702 m).   Weight as of this encounter: 97.5 kg.    Nutritional Assessment: Body mass index is 33.67 kg/m.Marland Kitchen Seen by dietician.  I agree with the assessment and plan as outlined below: Nutrition Status:     Consultants:  ID and CTS  Procedures:  As above  Antimicrobials:  Anti-infectives (From admission, onward)    Start     Dose/Rate Route Frequency Ordered Stop   06/05/23 1400  gentamicin (GARAMYCIN) 230 mg in dextrose 5 % 50 mL IVPB        3 mg/kg  76.6 kg (Adjusted) 111.5 mL/hr over 30 Minutes Intravenous Every 24 hours 06/05/23 0745  06/04/23 0900  vancomycin (VANCOREADY) IVPB 1500 mg/300 mL        1,500 mg 150 mL/hr over 120 Minutes Intravenous Every 24 hours 06/03/23 1453     06/02/23 1445  gentamicin (GARAMYCIN) 230 mg in dextrose 5 % 50 mL IVPB  Status:  Discontinued        3 mg/kg  76.6 kg (Adjusted) 111.5 mL/hr over 30 Minutes  Intravenous Every 24 hours 06/02/23 1348 06/05/23 0745   05/31/23 0900  vancomycin (VANCOREADY) IVPB 1750 mg/350 mL  Status:  Discontinued        1,750 mg 175 mL/hr over 120 Minutes Intravenous Every 24 hours 05/30/23 2148 06/03/23 1453   05/28/23 1800  vancomycin (VANCOREADY) IVPB 1250 mg/250 mL  Status:  Discontinued        1,250 mg 166.7 mL/hr over 90 Minutes Intravenous Every 24 hours 05/27/23 1707 05/28/23 1210   05/28/23 1800  Vancomycin (VANCOCIN) 1,250 mg in sodium chloride 0.9 % 250 mL IVPB  Status:  Discontinued        1,250 mg 166.7 mL/hr over 90 Minutes Intravenous Every 24 hours 05/28/23 1209 05/30/23 2148   05/27/23 2300  cefTRIAXone (ROCEPHIN) 1 g in sodium chloride 0.9 % 100 mL IVPB  Status:  Discontinued        1 g 200 mL/hr over 30 Minutes Intravenous Every 24 hours 05/27/23 0147 05/28/23 1112   05/27/23 1800  vancomycin (VANCOREADY) IVPB 2000 mg/400 mL        2,000 mg 200 mL/hr over 120 Minutes Intravenous  Once 05/27/23 1707 05/27/23 2026   05/26/23 2315  cefTRIAXone (ROCEPHIN) 1 g in sodium chloride 0.9 % 100 mL IVPB        1 g 200 mL/hr over 30 Minutes Intravenous  Once 05/26/23 2308 05/26/23 2346         Subjective: Not happy with her current options for facilities but otherwise no complaints or concerns today.  Objective: Vitals:   06/07/23 1958 06/08/23 0421 06/08/23 0823 06/08/23 1629  BP: 139/83 120/75 130/73 130/73  Pulse: 100 87 93 92  Resp: 20 20 18    Temp: 98.5 F (36.9 C) 97.6 F (36.4 C) 98 F (36.7 C) 98.2 F (36.8 C)  TempSrc: Oral  Oral Oral  SpO2: 93% 93% 95% 92%  Weight:  97.5 kg    Height:        Intake/Output Summary (Last 24 hours) at 06/08/2023 1657 Last data filed at 06/08/2023 0422 Gross per 24 hour  Intake --  Output 150 ml  Net -150 ml   Filed Weights   06/03/23 0355 06/06/23 0446 06/08/23 0421  Weight: 100.7 kg 97.5 kg 97.5 kg     Physical Exam  Constitutional: In no distress.  Cardiovascular: Normal rate, regular  rhythm. 1+ edema of BLE  Pulmonary: Non labored breathing on room air, no wheezing or rales.   Abdominal: Soft. Normal bowel sounds. Non distended and non tender Musculoskeletal: BLE tender to deep palpation Neurological: Alert and oriented to person, place, and time. Non focal  Skin: Skin is warm and dry.    Data Reviewed: I have personally reviewed following labs and imaging studies  CBC: Recent Labs  Lab 06/02/23 0526 06/03/23 0758 06/05/23 0325 06/08/23 0223  WBC 6.8 7.8 6.8 6.4  HGB 13.7 15.1* 13.2 13.1  HCT 40.9 44.8 39.8 40.0  MCV 85.2 85.8 85.2 85.8  PLT 177 212 177 175   Basic Metabolic Panel: Recent Labs  Lab 06/02/23 0526 06/03/23 0758 06/05/23  1610 06/06/23 0828 06/08/23 0223  NA 137 139 137 138 138  K 3.6 3.5 3.3* 3.5 3.5  CL 102 99 101 98 100  CO2 27 30 28 31 29   GLUCOSE 106* 123* 151* 95 110*  BUN 8 7* 8 7* 8  CREATININE 0.59 0.62 0.75 0.65 0.64  CALCIUM 8.3* 8.9 8.2* 8.4* 8.5*  MG 1.9 1.8  --   --   --    GFR: Estimated Creatinine Clearance: 66.2 mL/min (by C-G formula based on SCr of 0.64 mg/dL). Liver Function Tests: No results for input(s): "AST", "ALT", "ALKPHOS", "BILITOT", "PROT", "ALBUMIN" in the last 168 hours. No results for input(s): "LIPASE", "AMYLASE" in the last 168 hours. No results for input(s): "AMMONIA" in the last 168 hours. Coagulation Profile: No results for input(s): "INR", "PROTIME" in the last 168 hours. Cardiac Enzymes: No results for input(s): "CKTOTAL", "CKMB", "CKMBINDEX", "TROPONINI" in the last 168 hours. BNP (last 3 results) No results for input(s): "PROBNP" in the last 8760 hours. HbA1C: No results for input(s): "HGBA1C" in the last 72 hours. CBG: Recent Labs  Lab 06/05/23 1539  GLUCAP 131*   Lipid Profile: No results for input(s): "CHOL", "HDL", "LDLCALC", "TRIG", "CHOLHDL", "LDLDIRECT" in the last 72 hours. Thyroid Function Tests: No results for input(s): "TSH", "T4TOTAL", "FREET4", "T3FREE", "THYROIDAB"  in the last 72 hours. Anemia Panel: No results for input(s): "VITAMINB12", "FOLATE", "FERRITIN", "TIBC", "IRON", "RETICCTPCT" in the last 72 hours. Sepsis Labs: No results for input(s): "PROCALCITON", "LATICACIDVEN" in the last 168 hours.  No results found for this or any previous visit (from the past 240 hours).    Radiology Studies: No results found.   Scheduled Meds:  apixaban  5 mg Oral BID   Chlorhexidine Gluconate Cloth  6 each Topical Daily   gabapentin  200 mg Oral TID   hydrocerin   Topical BID   nystatin   Topical Daily   polyethylene glycol  17 g Oral Daily   pramipexole  0.25 mg Oral TID   sodium chloride flush  10-40 mL Intracatheter Q12H   Continuous Infusions:  gentamicin 230 mg (06/08/23 1458)   vancomycin 1,500 mg (06/08/23 0819)     LOS: 12 days   Tobey Grim, MD [facility]  06/08/2023, 4:57 PM    Please page via Loretha Stapler and do not message via secure chat for urgent patient care matters. Secure chat can be used for non urgent patient care matters.  How to contact the [facility] Attending or Consulting provider 7A - 7P or covering provider during after hours 7P -7A, for this patient?  Check the care team in [facility] and look for a) attending/consulting TRH provider listed and b) the [facility] team listed. Page or secure chat 7A-7P. Log into [email or web address] and use [facility]'s universal password to access. If you do not have the password, please contact the hospital operator. Locate the [facility] provider you are looking for under [facility] and page to a number that you can be directly reached. If you still have difficulty reaching the provider, please page the [facility] (Director on Call) for the Hospitalists listed on amion for assistance.

## 2023-06-08 NOTE — Progress Notes (Signed)
 Physical Therapy Treatment Patient Details Name: Denise Kelly MRN: [id number] DOB: 1940/06/18 Today's Date: 06/08/2023   History of Present Illness 83 y.o. presents to [facility]/26/23 due to weeping wound in the RLE found at living facility sitting in feces and urine. Urinalysis concerning for infection and CTA with signs of consolidation of R lower lobe.  Recent admit 2/4, 1/17 and 1/22 for UTI and BLE cellulitis. PMHx: alcohol abuse, anxiety/depression, GERD, HTN, HOH, obesity, chronic diastolic CHF, PE    PT Comments  Pt remains highly anxious and fearful. Difficult to stay on task. Slow progress. Patient will benefit from continued inpatient follow up therapy, <3 hours/day     If plan is discharge home, recommend the following: A lot of help with walking and/or transfers;Assistance with cooking/housework;Assist for transportation;Supervision due to cognitive status   Can travel by private vehicle     No  Equipment Recommendations  Wheelchair cushion (measurements PT);Wheelchair (measurements PT)    Recommendations for Other Services       Precautions / Restrictions Precautions Precautions: Fall Recall of Precautions/Restrictions: Impaired Restrictions Weight Bearing Restrictions Per Provider Order: No     Mobility  Bed Mobility Overal bed mobility: Needs Assistance Bed Mobility: Supine to Sit     Supine to sit: Min assist     General bed mobility comments: Assist to elevate trunk into sitting    Transfers Overall transfer level: Needs assistance Equipment used: Ambulation equipment used Transfers: Sit to/from Stand, Bed to chair/wheelchair/BSC Sit to Stand: Min assist           General transfer comment: Pt very anxious and needing to use BSC. Used Stedy due to level of anxiety. After BSC and back to EOB pt declined any standing with walker. Transfer via Lift Equipment: Stedy  Ambulation/Gait                   Stairs             Wheelchair  Mobility     Tilt Bed    Modified Rankin (Stroke Patients Only)       Balance Overall balance assessment: Needs assistance Sitting-balance support: Single extremity supported, Bilateral upper extremity supported, Feet supported Sitting balance-Leahy Scale: Poor Sitting balance - Comments: UE support due to anxiety and fear of falling   Standing balance support: Bilateral upper extremity supported, During functional activity, Reliant on assistive device for balance Standing balance-Leahy Scale: Poor                              Communication Communication Communication: Impaired Factors Affecting Communication: Hearing impaired  Cognition Arousal: Alert Behavior During Therapy: Anxious   PT - Cognitive impairments: No family/caregiver present to determine baseline                       PT - Cognition Comments: Difficulty staying on task. Very fearful of falling. Following commands: Impaired Following commands impaired: Only follows one step commands consistently    Cueing Cueing Techniques: Verbal cues, Tactile cues  Exercises      General Comments        Pertinent Vitals/Pain Pain Assessment Pain Assessment: Faces Faces Pain Scale: No hurt    Home Living                          Prior Function  PT Goals (current goals can now be found in the care plan section) Acute Rehab PT Goals Patient Stated Goal: independence, return to apartment Progress towards PT goals: Progressing toward goals    Frequency    Min 1X/week      PT Plan      Co-evaluation              AM-PAC PT "6 Clicks" Mobility   Outcome Measure  Help needed turning from your back to your side while in a flat bed without using bedrails?: A Little Help needed moving from lying on your back to sitting on the side of a flat bed without using bedrails?: A Little Help needed moving to and from a bed to a chair (including a wheelchair)?: A  Lot Help needed standing up from a chair using your arms (e.g., wheelchair or bedside chair)?: A Lot Help needed to walk in hospital room?: Total Help needed climbing 3-5 steps with a railing? : Total 6 Click Score: 12    End of Session   Activity Tolerance: Other (comment) (limited by anxiety) Patient left: in bed;with call bell/phone within reach;with bed alarm set Nurse Communication: Mobility status PT Visit Diagnosis: Other abnormalities of gait and mobility (R26.89);Muscle weakness (generalized) (M62.81)     Time: 2130-8657 PT Time Calculation (min) (ACUTE ONLY): 25 min  Charges:    $Therapeutic Activity: 8-22 mins PT General Charges $$ ACUTE PT VISIT: 1 Visit                     [facility] PT Acute Rehabilitation Services Office [phone]    Angelina Ok [facility] 06/08/2023, 6:19 PM

## 2023-06-08 NOTE — TOC Progression Note (Addendum)
 Transition of Care [facility]) - Progression Note    Patient Details  Name: Denise Kelly MRN: [id number] Date of Birth: 17-Nov-1940  [facility] CM/SW Contact  Erin Sons, [place] Phone Number: 06/08/2023, 12:15 PM  Clinical Narrative:     CSW met with pt bedside and provided SNF offers again. Explained that [facility] could not offer bed. CSW explained multiple times that referrals have been sent to all facilities in [place] and some outside of [place] and that list provided to pt are only facilities that can offer a bed. Pt is not satisfied with bed offers and requests multiple facilities that declined bed offer. CSW reiterated that the facilities she is requesting have declined bed offer. CSW explained that choice would be needed from SNFs that can offer a bed. Pt stated she needed to make some calls and requests CSW come back later.    1505: Met with pt again for SNF choice. She expresses being dissatisfied with options though out of her options, she chooses [facility]. CSW confirmed bed offer with [facility]. They will initiate SNF auth.   Expected Discharge Plan: Skilled Nursing Facility Barriers to Discharge: SNF choice, Insurance Authorization  Expected Discharge Plan and Services In-house Referral: Clinical Social Work     Living arrangements for the past 2 months: Independent Living Facility                                       Social Determinants of Health (SDOH) Interventions SDOH Screenings   Food Insecurity: No Food Insecurity (05/27/2023)  Housing: Low Risk  (05/27/2023)  Transportation Needs: No Transportation Needs (05/27/2023)  Utilities: Not At Risk (05/27/2023)  Depression (PHQ2-9): Low Risk  (01/09/2019)  Social Connections: Socially Isolated (05/27/2023)  Tobacco Use: Medium Risk (05/26/2023)    Readmission Risk Interventions    04/08/2023    1:02 PM 04/06/2023    3:38 PM 05/02/2022   11:19 AM  Readmission Risk Prevention Plan   Transportation Screening Complete Complete Complete  PCP or Specialist Appt within 3-5 Days Complete Complete   HRI or Home Care Consult Complete Complete   Social Work Consult for Recovery Care Planning/Counseling Complete Complete   Palliative Care Screening Complete Not Applicable   Medication Review Oceanographer) Complete Complete Complete  PCP or Specialist appointment within 3-5 days of discharge   Complete  HRI or Home Care Consult   Complete  SW Recovery Care/Counseling Consult   Complete  Palliative Care Screening   Not Applicable  Skilled Nursing Facility   Complete

## 2023-06-08 NOTE — Plan of Care (Signed)

## 2023-06-09 DIAGNOSIS — N3000 Acute cystitis without hematuria: Secondary | ICD-10-CM | POA: Diagnosis not present

## 2023-06-09 MED ORDER — METOPROLOL TARTRATE 25 MG PO TABS
25.0000 mg | ORAL_TABLET | Freq: Two times a day (BID) | ORAL | Status: DC
  Administered 2023-06-09 – 2023-06-10 (×2): 25 mg via ORAL
  Filled 2023-06-09 (×2): qty 1

## 2023-06-09 NOTE — Progress Notes (Addendum)
 Progress Note   Patient: Denise Kelly ZOX:[id number] DOB: 1941/01/12 DOA: 05/26/2023     13 DOS: the patient was seen and examined on 06/09/2023   Brief hospital course:  83 year old with history of alcohol use, anxiety, HTN came to the ED with confusion. Reports of weeping wound in the right lower extremity, found at her living facility sitting in feces and urine. In the ER, concerns of urinary tract infection, right lower lobe consolidation. She also reported diarrhea, reportedly has had norovirus going around in her facility. Eventually blood cultures grew multiple species with some concerns of possible contaminated therefore repeat surveillance cultures ordered. Infectious disease also consulted for their input. In terms of her diarrhea GI panel and C. difficile studies were negative. Echocardiogram is negative for vegetation. TEE recommended. 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  Seen by CT surgery, not a candidate for surgical repair.  ID recommended 6 weeks of antibiotics.  Pending placement at the moment.  Details below.    Assessment and Plan:   Principal Problem:   Urinary tract infection Active Problems:   Bacteremia     Acute metabolic encephalopathy:  Most likely secondary to UTI Resolved.   Patient is back to her baseline mental status and is  alert and oriented.     Multi organism bacteremia with concerns of endocarditis - Growing E faecalis, staph epi dermatitis and staph lugdunensis. Transthoracic echo without overt dysfunction/evidence of vegetation  TEE showed questionable echodense mass on multiple valves  CT surgery contacted - not candidate for valve replacement/debridement. -Repeat cultures remain negative to date - ID consulted - now on vancomycin/gentomycin - plan for 6 weeks of therapy/EOT 07/13/2023 - outpatient followup 4/7 with ID Dr Thedore Mins     Diarrhea, resolved Norovirus cases reported at her facility.   GI panel and C. difficile is  negative here. No further issues    Hypokalemia/hypocalcemia Supplemented    Chronic diastolic CHF, 65% Appears compensated  Continue furosemide and metoprolol     Chronic venous stasis lower extremity bilaterally Elevate lower extremities    GERD - PPI   PE, per history -Continue outpatient Eliquis   Generalized weakness: PT OT recommended SNF.  TOC working on placement.  [facility] is not willing to take her back.  Reportedly, TOC has been having difficulties getting hold of her daughter [name] who is the healthcare power of attorney but 3/21, patient prohibited TOC staff to contact her and provide her any information.  Patient deciding on facility today.        Subjective: No new complaints  Physical Exam: Vitals:   06/09/23 0458 06/09/23 0500 06/09/23 0851 06/09/23 1504  BP: 126/63 126/63 132/77 106/61  Pulse: 85 85 (!) 102 93  Resp: 19 19 16 18   Temp: 98.1 F (36.7 C) 98.1 F (36.7 C) 98 F (36.7 C) 97.8 F (36.6 C)  TempSrc: Oral Oral Oral   SpO2: 91% 91% 93% 94%  Weight:      Height:       Constitutional: Appears comfortable and in no distress Cardiovascular: Normal rate, regular rhythm. 1+ edema of BLE  Pulmonary: Non labored breathing on room air, no wheezing or rales.   Abdominal: Soft. Normal bowel sounds. Non distended and non tender Musculoskeletal: Normal range of motion Neurological: Alert and oriented to person, place, and time. Non focal  Skin: Skin is warm and dry.    Data Reviewed:  There are no new results to review at this time.  Family Communication: Plan  of care discussed with patient in detail.  She verbalizes understanding and agrees with the plan.  Disposition: Status is: Inpatient Remains inpatient appropriate because: Awaiting discharge to SNF  Planned Discharge Destination: Skilled nursing facility    Time spent: 33 minutes  Author: Lucile Shutters, MD 06/09/2023 3:15 PM  For on call review [email or web address].

## 2023-06-09 NOTE — TOC Progression Note (Signed)
 Transition of Care [facility]) - Progression Note    Patient Details  Name: Denise Kelly MRN: [id number] Date of Birth: 12-01-1940  Transition of Care [facility]) CM/SW Contact  Naira Standiford A [place], LCSW Phone Number: 06/09/2023, 3:36 PM  Clinical Narrative:     CSW contacted [facility], authorization has been started by their Central Intake. Status pending, will notify CSW if authorization gets approved.    TOC will continue to follow.   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                                       Social Determinants of Health (SDOH) Interventions SDOH Screenings   Food Insecurity: No Food Insecurity (05/27/2023)  Housing: Low Risk  (05/27/2023)  Transportation Needs: No Transportation Needs (05/27/2023)  Utilities: Not At Risk (05/27/2023)  Depression (PHQ2-9): Low Risk  (01/09/2019)  Social Connections: Socially Isolated (05/27/2023)  Tobacco Use: Medium Risk (05/26/2023)    Readmission Risk Interventions    04/08/2023    1:02 PM 04/06/2023    3:38 PM 05/02/2022   11:19 AM  Readmission Risk Prevention Plan  Transportation Screening Complete Complete Complete  PCP or Specialist Appt within 3-5 Days Complete Complete   HRI or Home Care Consult Complete Complete   Social Work Consult for Recovery Care Planning/Counseling Complete Complete   Palliative Care Screening Complete Not Applicable   Medication Review Oceanographer) Complete Complete Complete  PCP or Specialist appointment within 3-5 days of discharge   Complete  HRI or Home Care Consult   Complete  SW Recovery Care/Counseling Consult   Complete  Palliative Care Screening   Not Applicable  Skilled Nursing Facility   Complete

## 2023-06-10 DIAGNOSIS — D485 Neoplasm of uncertain behavior of skin: Secondary | ICD-10-CM | POA: Diagnosis not present

## 2023-06-10 DIAGNOSIS — C44719 Basal cell carcinoma of skin of left lower limb, including hip: Secondary | ICD-10-CM | POA: Diagnosis not present

## 2023-06-10 DIAGNOSIS — F4325 Adjustment disorder with mixed disturbance of emotions and conduct: Secondary | ICD-10-CM | POA: Diagnosis not present

## 2023-06-10 DIAGNOSIS — N3000 Acute cystitis without hematuria: Secondary | ICD-10-CM | POA: Diagnosis not present

## 2023-06-10 DIAGNOSIS — Z95828 Presence of other vascular implants and grafts: Secondary | ICD-10-CM | POA: Diagnosis not present

## 2023-06-10 DIAGNOSIS — Z86718 Personal history of other venous thrombosis and embolism: Secondary | ICD-10-CM | POA: Diagnosis not present

## 2023-06-10 DIAGNOSIS — G4700 Insomnia, unspecified: Secondary | ICD-10-CM | POA: Diagnosis not present

## 2023-06-10 DIAGNOSIS — C44729 Squamous cell carcinoma of skin of left lower limb, including hip: Secondary | ICD-10-CM | POA: Diagnosis not present

## 2023-06-10 DIAGNOSIS — U071 COVID-19: Secondary | ICD-10-CM | POA: Diagnosis not present

## 2023-06-10 DIAGNOSIS — I5032 Chronic diastolic (congestive) heart failure: Secondary | ICD-10-CM | POA: Diagnosis not present

## 2023-06-10 DIAGNOSIS — R110 Nausea: Secondary | ICD-10-CM | POA: Diagnosis not present

## 2023-06-10 DIAGNOSIS — R531 Weakness: Secondary | ICD-10-CM | POA: Diagnosis not present

## 2023-06-10 DIAGNOSIS — R4182 Altered mental status, unspecified: Secondary | ICD-10-CM | POA: Diagnosis not present

## 2023-06-10 DIAGNOSIS — F4320 Adjustment disorder, unspecified: Secondary | ICD-10-CM | POA: Diagnosis not present

## 2023-06-10 DIAGNOSIS — G2581 Restless legs syndrome: Secondary | ICD-10-CM | POA: Diagnosis not present

## 2023-06-10 DIAGNOSIS — K219 Gastro-esophageal reflux disease without esophagitis: Secondary | ICD-10-CM | POA: Diagnosis not present

## 2023-06-10 DIAGNOSIS — R7881 Bacteremia: Secondary | ICD-10-CM | POA: Diagnosis not present

## 2023-06-10 DIAGNOSIS — Z86711 Personal history of pulmonary embolism: Secondary | ICD-10-CM | POA: Diagnosis not present

## 2023-06-10 DIAGNOSIS — R059 Cough, unspecified: Secondary | ICD-10-CM | POA: Diagnosis not present

## 2023-06-10 DIAGNOSIS — Z7401 Bed confinement status: Secondary | ICD-10-CM | POA: Diagnosis not present

## 2023-06-10 DIAGNOSIS — E876 Hypokalemia: Secondary | ICD-10-CM | POA: Diagnosis not present

## 2023-06-10 DIAGNOSIS — I38 Endocarditis, valve unspecified: Secondary | ICD-10-CM | POA: Diagnosis not present

## 2023-06-10 DIAGNOSIS — R627 Adult failure to thrive: Secondary | ICD-10-CM | POA: Diagnosis not present

## 2023-06-10 DIAGNOSIS — F331 Major depressive disorder, recurrent, moderate: Secondary | ICD-10-CM | POA: Diagnosis not present

## 2023-06-10 DIAGNOSIS — I10 Essential (primary) hypertension: Secondary | ICD-10-CM | POA: Diagnosis not present

## 2023-06-10 DIAGNOSIS — E538 Deficiency of other specified B group vitamins: Secondary | ICD-10-CM | POA: Diagnosis not present

## 2023-06-10 DIAGNOSIS — M6281 Muscle weakness (generalized): Secondary | ICD-10-CM | POA: Diagnosis not present

## 2023-06-10 DIAGNOSIS — F4322 Adjustment disorder with anxiety: Secondary | ICD-10-CM | POA: Diagnosis not present

## 2023-06-10 DIAGNOSIS — G894 Chronic pain syndrome: Secondary | ICD-10-CM | POA: Diagnosis not present

## 2023-06-10 LAB — CBC
HCT: 39.6 % (ref 36.0–46.0)
Hemoglobin: 12.9 g/dL (ref 12.0–15.0)
MCH: 28 pg (ref 26.0–34.0)
MCHC: 32.6 g/dL (ref 30.0–36.0)
MCV: 86.1 fL (ref 80.0–100.0)
Platelets: 168 10*3/uL (ref 150–400)
RBC: 4.6 MIL/uL (ref 3.87–5.11)
RDW: 14 % (ref 11.5–15.5)
WBC: 6.1 10*3/uL (ref 4.0–10.5)
nRBC: 0 % (ref 0.0–0.2)

## 2023-06-10 MED ORDER — METOPROLOL TARTRATE 25 MG PO TABS: 25.0000 mg | ORAL_TABLET | Freq: Two times a day (BID) | ORAL | 0 refills | Status: AC

## 2023-06-10 MED ORDER — GENTAMICIN IV (FOR PTA / DISCHARGE USE ONLY): 230.0000 mg | INTRAVENOUS | 0 refills | Status: AC

## 2023-06-10 MED ORDER — PRAMIPEXOLE DIHYDROCHLORIDE 0.25 MG PO TABS: 0.2500 mg | ORAL_TABLET | Freq: Three times a day (TID) | ORAL | 0 refills | Status: AC

## 2023-06-10 MED ORDER — VANCOMYCIN IV (FOR PTA / DISCHARGE USE ONLY): 1500.0000 mg | INTRAVENOUS | 0 refills | Status: AC

## 2023-06-10 NOTE — Discharge Summary (Signed)
 Physician Discharge Summary  Denise Kelly ZOX:[id number] DOB: Sep 22, 1940 DOA: 05/26/2023  PCP: Eloisa Northern, MD  Admit date: 05/26/2023 Discharge date: 06/10/2023 30 Day Unplanned Readmission Risk Score    Flowsheet Row ED to Hosp-Admission (Current) from 05/26/2023 in [place] [facility] Unit  30 Day Unplanned Readmission Risk Score (%) 39.09 Filed at 06/10/2023 0801       This score is the patient's risk of an unplanned readmission within 30 days of being discharged (0 -100%). The score is based on dignosis, age, lab data, medications, orders, and past utilization.   Low:  0-14.9   Medium: 15-21.9   High: 22-29.9   Extreme: 30 and above          Admitted From: SNF Disposition: SNF  Recommendations for Outpatient Follow-up:  Follow up with PCP in 1-2 weeks Please obtain BMP/CBC in one week Follow-up with ID/Dr. Thedore Mins on 06/22/2023. Please follow up with your PCP on the following pending results: Unresulted Labs (From admission, onward)     Start     Ordered   06/05/23 0500  CBC  Every 72 hours,   R      06/03/23 1522   06/05/23 0500  Basic metabolic panel  Every 72 hours,   R      06/03/23 1522              Home Health: None Equipment/Devices: None  Discharge Condition: Stable CODE STATUS: Full code Diet recommendation: Cardiac  Subjective: Seen and examined.  No complaints.  Brief/Interim Summary: 83 year old with history of alcohol use, anxiety, HTN came to the ED with confusion. Reports of weeping wound in the right lower extremity, found at her living facility sitting in feces and urine. In the ER, concerns of urinary tract infection, right lower lobe consolidation. She also reported diarrhea, reportedly has had norovirus going around in her facility. Eventually blood cultures grew multiple species with some concerns of possible contaminated therefore repeat surveillance cultures ordered. Infectious disease also consulted for their input. In terms of her  diarrhea GI panel and C. difficile studies were negative. Echocardiogram is negative for vegetation. TEE recommended. 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  Seen by CT surgery, not a candidate for surgical repair.  ID recommended 6 weeks of antibiotics.  Details below.   Acute metabolic encephalopathy: Resolved.  Fully alert and oriented.   Multi organism bacteremia with concerns of endocarditis - Growing E faecalis, staph epi dermatitis and staph lugdunensis.cho without overt dysfunction/evidence of vegetation - TEE shows questionable echodense mass on multiple valves - CT surgery contacted - not candidate for valve replacement/debridement. -Repeat cultures remain negative to date - ID consulted - now on vancomycin/gentomycin - plan for 6 weeks of therapy/EOT 07/13/2023; outpatient followup 4/7 with ID Dr Thedore Mins - E    Diarrhea, resolved - Norovirus cases reported at her facility.  GI panel and C. difficile is negative here.   Hypokalemia/hypocalcemia Replenished.   Chronic diastolic CHF, 65% - Overall appears to be compensated.  Prior echocardiogram showing EF of 65%   Chronic venous stasis lower extremity bilaterally - Recently treated 7 days of IV Rocephin about 5 weeks ago   GERD - PPI   PE, per history -Continue outpatient Eliquis   Generalized weakness/disposition: PT OT recommended SNF.  She is being discharged today in stable condition.  Discharge plan was discussed with patient and/or family member and they verbalized understanding and agreed with it.  Discharge Diagnoses:  Principal Problem:  Urinary tract infection Active Problems:   Bacteremia    Discharge Instructions  Discharge Instructions     Home infusion instructions   Complete by: As directed    Instructions: Flushing of vascular access device: 0.9% NaCl pre/post medication administration and prn patency; Heparin 100 u/ml, 5ml for implanted ports and Heparin 10u/ml, 5ml for all other  central venous catheters.      Allergies as of 06/10/2023       Reactions   Sulfa Antibiotics Hives, Swelling, Other (See Comments)   Facial/eye swelling    Coreg [carvedilol] Other (See Comments)   Memory loss   Motrin [ibuprofen] Palpitations   Toprol Xl [metoprolol Tartrate] Cough   Doxycycline Hyclate Other (See Comments)   HEARTBURN   Flovent Hfa [fluticasone] Itching   Statins Other (See Comments)   MENTAL STATUS CHANGE        Medication List     STOP taking these medications    cefadroxil 500 MG capsule Commonly known as: DURICEF   hydrOXYzine 10 MG tablet Commonly known as: ATARAX   hydrOXYzine 25 MG tablet Commonly known as: ATARAX   traMADol 50 MG tablet Commonly known as: ULTRAM   traZODone 100 MG tablet Commonly known as: DESYREL       TAKE these medications    acetaminophen 325 MG tablet Commonly known as: TYLENOL Take 650 mg by mouth in the morning and at bedtime.   cyanocobalamin 1000 MCG tablet Commonly known as: VITAMIN B12 Take 1,000 mcg by mouth daily.   Eliquis 5 MG Tabs tablet Generic drug: apixaban Take 1 tablet (5 mg total) by mouth 2 (two) times daily.   furosemide 20 MG tablet Commonly known as: LASIX Take 1 tablet (20 mg total) by mouth daily.   gabapentin 100 MG capsule Commonly known as: NEURONTIN Take 2 capsules (200 mg total) by mouth 3 (three) times daily. What changed: how much to take   gentamicin IVPB Commonly known as: GARAMYCIN Inject 230 mg into the vein daily. Indication:  Endocarditis  First Dose: Yes Last Day of Therapy:  07/13/23 Labs - Sunday/Monday:  CBC/D, BMP, and gentamicin trough. Labs - Thursday:  BMP and gentamicin trough Labs - Once weekly: ESR and CRP   metoprolol tartrate 25 MG tablet Commonly known as: LOPRESSOR Take 1 tablet (25 mg total) by mouth 2 (two) times daily.   ondansetron 4 MG tablet Commonly known as: ZOFRAN Take 1 tablet (4 mg total) by mouth every 8 (eight) hours as  needed for nausea or vomiting.   phenazopyridine 100 MG tablet Commonly known as: PYRIDIUM Take 1 tablet (100 mg total) by mouth 3 (three) times daily as needed (dysurea).   potassium chloride 10 MEQ tablet Commonly known as: KLOR-CON M Take 1 tablet (10 mEq total) by mouth daily.   pramipexole 0.25 MG tablet Commonly known as: MIRAPEX Take 1 tablet (0.25 mg total) by mouth 3 (three) times daily. What changed:  medication strength how much to take when to take this   vancomycin IVPB Inject 1,500 mg into the vein daily. Indication:  Endocarditis First Dose: Yes Last Day of Therapy:  07/13/23 Labs - Sunday/Monday:  CBC/D, BMP, and vancomycin trough. Labs - Thursday:  BMP and vancomycin trough Labs - Once weekly: ESR and CRP               Home Infusion Instuctions  (From admission, onward)           Start     Ordered  06/10/23 0000  Home infusion instructions       Question:  Instructions  Answer:  Flushing of vascular access device: 0.9% NaCl pre/post medication administration and prn patency; Heparin 100 u/ml, 5ml for implanted ports and Heparin 10u/ml, 5ml for all other central venous catheters.   06/10/23 1107            Follow-up Information     Eloisa Northern, MD Follow up in 1 week(s).   Specialty: Internal Medicine Contact information: [address] [place] [address] [phone]                Allergies  Allergen Reactions   Sulfa Antibiotics Hives, Swelling and Other (See Comments)    Facial/eye swelling    Coreg [Carvedilol] Other (See Comments)    Memory loss   Motrin [Ibuprofen] Palpitations   Toprol Xl [Metoprolol Tartrate] Cough   Doxycycline Hyclate Other (See Comments)    HEARTBURN   Flovent Hfa [Fluticasone] Itching   Statins Other (See Comments)    MENTAL STATUS CHANGE    Consultations: ID, cardiothoracic surgery   Procedures/Studies: [place] EKG SITE RITE Result Date: 06/03/2023 If Site Rite image not attached,  placement could not be confirmed due to current cardiac rhythm.  ECHO TEE Result Date: 06/02/2023    TRANSESOPHOGEAL ECHO REPORT   Patient Name:   KAIDENCE SANT Date of Exam: 06/02/2023 Medical Rec #:  [id number]       Height:       67.0 in Accession #:    [id number]      Weight:       218.3 lb Date of Birth:  03-13-1941       BSA:          2.099 m Patient Age:    82 years        BP:           135/69 mmHg Patient Gender: F               HR:           83 bpm. Exam Location:  Inpatient Procedure: Transesophageal Echo, Color Doppler and Cardiac Doppler (Both            Spectral and Color Flow Doppler were utilized during procedure). Indications:     Endocarditis  History:         Patient has prior history of Echocardiogram examinations, most                  recent 05/28/2023.  Sonographer:     Harriette Bouillon RDCS Referring Phys:  [id number] [facility] Diagnosing Phys: Thomasene Ripple DO PROCEDURE: The transesophogeal probe was passed without difficulty through the esophogus of the patient. Sedation performed by different physician. The patient developed no complications during the procedure.  IMPRESSIONS  1. Left ventricular ejection fraction, by estimation, is 55 to 60%. The left ventricle has normal function.  2. There is an echodense mass on the wall of the right ventricular cavity. Right ventricular systolic function is normal. The right ventricular size is normal.  3. No left atrial/left atrial appendage thrombus was detected.  4. Small mobile echodensity in the right atrium not consistent with charateristics of normal variant right atrial structures.  5. A small pericardial effusion is present. The pericardial effusion is circumferential.  6. Echodense mass (1.13 cm x 0.528 cm) on the anterior mitral leaflet. The mitral valve is abnormal. Mild to moderate mitral valve regurgitation. No evidence  of mitral stenosis.  7. The tricuspid valve is abnormal.  8. The aortic valve is tricuspid. Aortic valve regurgitation is  trivial. Aortic valve sclerosis is present, with no evidence of aortic valve stenosis. Conclusion(s)/Recommendation(s): Findings are concerning for vegetation/infective endocarditis as detailed above. Findings concerning for mitral valve vegetation. FINDINGS  Left Ventricle: Left ventricular ejection fraction, by estimation, is 55 to 60%. The left ventricle has normal function. The left ventricular internal cavity size was small. Right Ventricle: There is an echodense mass on the wall of the right ventricular cavity. The right ventricular size is normal. No increase in right ventricular wall thickness. Right ventricular systolic function is normal. Left Atrium: Left atrial size was normal in size. No left atrial/left atrial appendage thrombus was detected. Right Atrium: Small mobile echodensity in the right atrium not consistent with charateristics of normal variant right atrial structures. Right atrial size was normal in size. Pericardium: A small pericardial effusion is present. The pericardial effusion is circumferential. Mitral Valve: Echodense mass (1.13 cm x 0.528 cm) on the anterior mitral leaflet. The mitral valve is abnormal. Mild to moderate mitral valve regurgitation. No evidence of mitral valve stenosis. Tricuspid Valve: The tricuspid valve is abnormal. Tricuspid valve regurgitation is mild . No evidence of tricuspid stenosis. Aortic Valve: There is a mobile echodense mass (0.772 cm). The aortic valve is tricuspid. Aortic valve regurgitation is trivial. Aortic valve sclerosis is present, with no evidence of aortic valve stenosis. Pulmonic Valve: The pulmonic valve was not well visualized. Pulmonic valve regurgitation is not visualized. Aorta: There is minimal (Grade I) layered plaque involving the descending aorta. Venous: The left upper pulmonary vein, right upper pulmonary vein and right lower pulmonary vein are normal. A normal flow pattern is recorded from the left upper pulmonary vein and the right  upper pulmonary vein.  LEFT VENTRICLE PLAX 2D LVOT diam:     1.90 cm LVOT Area:     2.84 cm   AORTA Ao Root diam: 3.20 cm Ao Asc diam:  3.30 cm  SHUNTS Systemic Diam: 1.90 cm Kardie Tobb DO Electronically signed by Thomasene Ripple DO Signature Date/Time: 06/02/2023/6:11:28 PM    Final    EP STUDY Result Date: 06/02/2023 See surgical note for result.  ECHOCARDIOGRAM COMPLETE Result Date: 05/28/2023    ECHOCARDIOGRAM REPORT   Patient Name:   PENI RUPARD Date of Exam: 05/28/2023 Medical Rec #:  [id number]       Height:       67.0 in Accession #:    [id number]      Weight:       218.3 lb Date of Birth:  1940/08/24       BSA:          2.099 m Patient Age:    82 years        BP:           135/64 mmHg Patient Gender: F               HR:           86 bpm. Exam Location:  Inpatient Procedure: 2D Echo, Cardiac Doppler and Color Doppler (Both Spectral and Color            Flow Doppler were utilized during procedure). Indications:    Endocarditis, bacteremia  History:        Patient has prior history of Echocardiogram examinations, most                 recent  04/22/2023. CHF, Signs/Symptoms:Altered Mental Status; Risk                 Factors:Hypertension.  Sonographer:    Vern Claude Referring Phys: [id number] Doreen Salvage AMIN IMPRESSIONS  1. Left ventricular ejection fraction, by estimation, is 65 to 70%. The left ventricle has normal function. Left ventricular endocardial border not optimally defined to evaluate regional wall motion. Left ventricular diastolic parameters are consistent with Grade I diastolic dysfunction (impaired relaxation).  2. Right ventricular systolic function is normal. The right ventricular size is normal.  3. The mitral valve is normal in structure. No evidence of mitral valve regurgitation. No evidence of mitral stenosis.  4. The aortic valve was not well visualized. Aortic valve regurgitation is not visualized. No aortic stenosis is present.  5. The inferior vena cava is normal in size with greater than  50% respiratory variability, suggesting right atrial pressure of 3 mmHg. Conclusion(s)/Recommendation(s): Study images insufficient to rul eout valvular vegetations on this transthoracic echocardiogram. Consider a transesophageal echocardiogram to exclude infective endocarditis if clinically indicated. FINDINGS  Left Ventricle: Left ventricular ejection fraction, by estimation, is 65 to 70%. The left ventricle has normal function. Left ventricular endocardial border not optimally defined to evaluate regional wall motion. The left ventricular internal cavity size was normal in size. There is no left ventricular hypertrophy. Left ventricular diastolic parameters are consistent with Grade I diastolic dysfunction (impaired relaxation). Normal left ventricular filling pressure. Right Ventricle: The right ventricular size is normal. No increase in right ventricular wall thickness. Right ventricular systolic function is normal. Left Atrium: Left atrial size was normal in size. Right Atrium: Right atrial size was normal in size. Pericardium: There is no evidence of pericardial effusion. Mitral Valve: The mitral valve is normal in structure. Mild mitral annular calcification. No evidence of mitral valve regurgitation. No evidence of mitral valve stenosis. MV peak gradient, 2.6 mmHg. The mean mitral valve gradient is 1.0 mmHg. Tricuspid Valve: The tricuspid valve is normal in structure. Tricuspid valve regurgitation is not demonstrated. No evidence of tricuspid stenosis. Aortic Valve: The aortic valve was not well visualized. Aortic valve regurgitation is not visualized. No aortic stenosis is present. Aortic valve mean gradient measures 5.0 mmHg. Aortic valve peak gradient measures 7.4 mmHg. Aortic valve area, by VTI measures 1.37 cm. Pulmonic Valve: The pulmonic valve was normal in structure. Pulmonic valve regurgitation is trivial. No evidence of pulmonic stenosis. Aorta: The aortic root is normal in size and structure.  Venous: The inferior vena cava is normal in size with greater than 50% respiratory variability, suggesting right atrial pressure of 3 mmHg. IAS/Shunts: No atrial level shunt detected by color flow Doppler.  LEFT VENTRICLE PLAX 2D LVIDd:         4.00 cm      Diastology LVIDs:         2.50 cm      LV e' medial:    5.66 cm/s LV PW:         0.90 cm      LV E/e' medial:  9.4 LV IVS:        1.00 cm      LV e' lateral:   4.90 cm/s LVOT diam:     1.90 cm      LV E/e' lateral: 10.9 LV SV:         37 LV SV Index:   18 LVOT Area:     2.84 cm  LV Volumes (MOD) LV vol d, MOD A2C: 78.1  ml LV vol d, MOD A4C: 132.0 ml LV vol s, MOD A2C: 28.6 ml LV vol s, MOD A4C: 41.0 ml LV SV MOD A2C:     49.5 ml LV SV MOD A4C:     132.0 ml LV SV MOD BP:      68.1 ml RIGHT VENTRICLE RV Basal diam:  3.20 cm RV Mid diam:    2.60 cm RV S prime:     10.90 cm/s LEFT ATRIUM             Index        RIGHT ATRIUM           Index LA diam:        2.40 cm 1.14 cm/m   RA Area:     10.90 cm LA Vol (A2C):   25.2 ml 12.01 ml/m  RA Volume:   20.00 ml  9.53 ml/m LA Vol (A4C):   24.8 ml 11.82 ml/m LA Biplane Vol: 26.1 ml 12.44 ml/m  AORTIC VALVE                    PULMONIC VALVE AV Area (Vmax):    1.65 cm     PV Vmax:       0.88 m/s AV Area (Vmean):   1.32 cm     PV Peak grad:  3.1 mmHg AV Area (VTI):     1.37 cm AV Vmax:           136.00 cm/s AV Vmean:          98.300 cm/s AV VTI:            0.273 m AV Peak Grad:      7.4 mmHg AV Mean Grad:      5.0 mmHg LVOT Vmax:         79.10 cm/s LVOT Vmean:        45.900 cm/s LVOT VTI:          0.132 m LVOT/AV VTI ratio: 0.48  AORTA Ao Root diam: 2.90 cm Ao Asc diam:  3.30 cm MITRAL VALVE MV Area (PHT): 3.17 cm    SHUNTS MV Area VTI:   2.53 cm    Systemic VTI:  0.13 m MV Peak grad:  2.6 mmHg    Systemic Diam: 1.90 cm MV Mean grad:  1.0 mmHg MV Vmax:       0.80 m/s MV Vmean:      54.1 cm/s MV Decel Time: 239 msec MV E velocity: 53.30 cm/s MV A velocity: 79.20 cm/s MV E/A ratio:  0.67 Armanda Magic MD Electronically  signed by Armanda Magic MD Signature Date/Time: 05/28/2023/2:05:59 PM    Final      Discharge Exam: Vitals:   06/10/23 0447 06/10/23 0839  BP: 116/83 128/76  Pulse: 68 82  Resp: 19   Temp: 97.8 F (36.6 C) 98 F (36.7 C)  SpO2: 95% 94%   Vitals:   06/09/23 1504 06/09/23 2026 06/10/23 0447 06/10/23 0839  BP: 106/61 113/71 116/83 128/76  Pulse: 93 79 68 82  Resp: 18 18 19    Temp: 97.8 F (36.6 C) 98 F (36.7 C) 97.8 F (36.6 C) 98 F (36.7 C)  TempSrc:  Oral Oral   SpO2: 94% 94% 95% 94%  Weight:      Height:        General: Pt is alert, awake, not in acute distress Cardiovascular: RRR, S1/S2 +, no rubs, no gallops Respiratory: CTA bilaterally, no wheezing, no rhonchi  Abdominal: Soft, NT, ND, bowel sounds + Extremities: Trace to +1 pitting edema bilateral lower extremity, no cyanosis    The results of significant diagnostics from this hospitalization (including imaging, microbiology, ancillary and laboratory) are listed below for reference.     Microbiology: No results found for this or any previous visit (from the past 240 hours).   Labs: BNP (last 3 results) Recent Labs    04/03/23 2231 04/08/23 1426  BNP 32.4 31.4   Basic Metabolic Panel: Recent Labs  Lab 06/05/23 0325 06/06/23 0828 06/08/23 0223  NA 137 138 138  K 3.3* 3.5 3.5  CL 101 98 100  CO2 28 31 29   GLUCOSE 151* 95 110*  BUN 8 7* 8  CREATININE 0.75 0.65 0.64  CALCIUM 8.2* 8.4* 8.5*   Liver Function Tests: No results for input(s): "AST", "ALT", "ALKPHOS", "BILITOT", "PROT", "ALBUMIN" in the last 168 hours. No results for input(s): "LIPASE", "AMYLASE" in the last 168 hours. No results for input(s): "AMMONIA" in the last 168 hours. CBC: Recent Labs  Lab 06/05/23 0325 06/08/23 0223 06/10/23 0221  WBC 6.8 6.4 6.1  HGB 13.2 13.1 12.9  HCT 39.8 40.0 39.6  MCV 85.2 85.8 86.1  PLT 177 175 168   Cardiac Enzymes: No results for input(s): "CKTOTAL", "CKMB", "CKMBINDEX", "TROPONINI" in the  last 168 hours. BNP: Invalid input(s): "POCBNP" CBG: Recent Labs  Lab 06/05/23 1539  GLUCAP 131*   D-Dimer No results for input(s): "DDIMER" in the last 72 hours. Hgb A1c No results for input(s): "HGBA1C" in the last 72 hours. Lipid Profile No results for input(s): "CHOL", "HDL", "LDLCALC", "TRIG", "CHOLHDL", "LDLDIRECT" in the last 72 hours. Thyroid function studies No results for input(s): "TSH", "T4TOTAL", "T3FREE", "THYROIDAB" in the last 72 hours.  Invalid input(s): "FREET3" Anemia work up No results for input(s): "VITAMINB12", "FOLATE", "FERRITIN", "TIBC", "IRON", "RETICCTPCT" in the last 72 hours. Urinalysis    Component Value Date/Time   COLORURINE AMBER (A) 05/26/2023 2025   APPEARANCEUR CLOUDY (A) 05/26/2023 2025   APPEARANCEUR Clear 09/19/2016 1633   LABSPEC 1.015 05/26/2023 2025   PHURINE 6.0 05/26/2023 2025   GLUCOSEU NEGATIVE 05/26/2023 2025   HGBUR MODERATE (A) 05/26/2023 2025   BILIRUBINUR NEGATIVE 05/26/2023 2025   BILIRUBINUR Negative 09/19/2016 1633   KETONESUR 20 (A) 05/26/2023 2025   PROTEINUR 30 (A) 05/26/2023 2025   UROBILINOGEN 1.0 11/30/2014 2105   NITRITE NEGATIVE 05/26/2023 2025   LEUKOCYTESUR LARGE (A) 05/26/2023 2025   Sepsis Labs Recent Labs  Lab 06/05/23 0325 06/08/23 0223 06/10/23 0221  WBC 6.8 6.4 6.1   Microbiology No results found for this or any previous visit (from the past 240 hours).  FURTHER DISCHARGE INSTRUCTIONS:   Get Medicines reviewed and adjusted: Please take all your medications with you for your next visit with your Primary MD   Laboratory/radiological data: Please request your Primary MD to go over all hospital tests and procedure/radiological results at the follow up, please ask your Primary MD to get all Hospital records sent to his/her office.   In some cases, they will be blood work, cultures and biopsy results pending at the time of your discharge. Please request that your primary care M.D. goes through  all the records of your hospital data and follows up on these results.   Also Note the following: If you experience worsening of your admission symptoms, develop shortness of breath, life threatening emergency, suicidal or homicidal thoughts you must seek medical attention immediately by calling 911 or calling your MD  immediately  if symptoms less severe.   You must read complete instructions/literature along with all the possible adverse reactions/side effects for all the Medicines you take and that have been prescribed to you. Take any new Medicines after you have completely understood and accpet all the possible adverse reactions/side effects.    Do not drive when taking Pain medications or sleeping medications (Benzodaizepines)   Do not take more than prescribed Pain, Sleep and Anxiety Medications. It is not advisable to combine anxiety,sleep and pain medications without talking with your primary care practitioner   Special Instructions: If you have smoked or chewed Tobacco  in the last 2 yrs please stop smoking, stop any regular Alcohol  and or any Recreational drug use.   Wear Seat belts while driving.   Please note: You were cared for by a hospitalist during your hospital stay. Once you are discharged, your primary care physician will handle any further medical issues. Please note that NO REFILLS for any discharge medications will be authorized once you are discharged, as it is imperative that you return to your primary care physician (or establish a relationship with a primary care physician if you do not have one) for your post hospital discharge needs so that they can reassess your need for medications and monitor your lab values  Time coordinating discharge: Over 30 minutes  SIGNED:   Hughie Closs, MD  [facility] 06/10/2023, 11:08 AM *Please note that this is a verbal dictation therefore any spelling or grammatical errors are due to the "[employer]" system  interpretation. If 7PM-7AM, please contact night-coverage [email or web address]

## 2023-06-10 NOTE — Progress Notes (Signed)
 PROGRESS NOTE    Denise Kelly  WUJ:[id number] DOB: February 14, 1941 DOA: 05/26/2023 PCP: Eloisa Northern, MD   Brief Narrative:  83 year old with history of alcohol use, anxiety, HTN came to the ED with confusion. Reports of weeping wound in the right lower extremity, found at her living facility sitting in feces and urine. In the ER, concerns of urinary tract infection, right lower lobe consolidation. She also reported diarrhea, reportedly has had norovirus going around in her facility. Eventually blood cultures grew multiple species with some concerns of possible contaminated therefore repeat surveillance cultures ordered. Infectious disease also consulted for their input. In terms of her diarrhea GI panel and C. difficile studies were negative. Echocardiogram is negative for vegetation. TEE recommended. 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  Seen by CT surgery, not a candidate for surgical repair.  ID recommended 6 weeks of antibiotics.  Pending placement at the moment.  Details below.  Assessment & Plan:   Principal Problem:   Urinary tract infection Active Problems:   Bacteremia   Acute metabolic encephalopathy: Resolved.  Fully alert and oriented.  Multi organism bacteremia with concerns of endocarditis - Growing E faecalis, staph epi dermatitis and staph lugdunensis.cho without overt dysfunction/evidence of vegetation - TEE shows questionable echodense mass on multiple valves - CT surgery contacted - not candidate for valve replacement/debridement. -Repeat cultures remain negative to date - ID consulted - now on vancomycin/gentomycin - plan for 6 weeks of therapy/EOT 07/13/2023; outpatient followup 4/7 with ID Dr Thedore Mins - E   Diarrhea, resolved - Norovirus cases reported at her facility.  GI panel and C. difficile is negative here.   Hypokalemia/hypocalcemia Replenished.   Chronic diastolic CHF, 65% - Overall appears to be compensated.  Prior echocardiogram showing EF of  65%   Chronic venous stasis lower extremity bilaterally - Recently treated 7 days of IV Rocephin about 5 weeks ago   GERD - PPI   PE, per history -Continue outpatient Eliquis   Generalized weakness/disposition: PT OT recommended SNF.  TOC working on placement.  [facility] is not willing to take her back.   DVT prophylaxis: SCDs Start: 05/27/23 0138 Eliquis   Code Status: Full Code  Family Communication:  None present at bedside.  Plan of care discussed with patient in length and he/she verbalized understanding and agreed with it.  Status is: Inpatient Remains inpatient appropriate because: Pending placement, patient medically stable.   Estimated body mass index is 33.67 kg/m as calculated from the following:   Height as of this encounter: 5\' 7"  (1.702 m).   Weight as of this encounter: 97.5 kg.    Nutritional Assessment: Body mass index is 33.67 kg/m.Marland Kitchen Seen by dietician.  I agree with the assessment and plan as outlined below: Nutrition Status:        . Skin Assessment: I have examined the patient's skin and I agree with the wound assessment as performed by the wound care RN as outlined below:    Consultants:  ID and CTS  Procedures:  As above  Antimicrobials:  Anti-infectives (From admission, onward)    Start     Dose/Rate Route Frequency Ordered Stop   06/05/23 1400  gentamicin (GARAMYCIN) 230 mg in dextrose 5 % 50 mL IVPB        3 mg/kg  76.6 kg (Adjusted) 111.5 mL/hr over 30 Minutes Intravenous Every 24 hours 06/05/23 0745     06/04/23 0900  vancomycin (VANCOREADY) IVPB 1500 mg/300 mL        1,500  mg 150 mL/hr over 120 Minutes Intravenous Every 24 hours 06/03/23 1453     06/02/23 1445  gentamicin (GARAMYCIN) 230 mg in dextrose 5 % 50 mL IVPB  Status:  Discontinued        3 mg/kg  76.6 kg (Adjusted) 111.5 mL/hr over 30 Minutes Intravenous Every 24 hours 06/02/23 1348 06/05/23 0745   05/31/23 0900  vancomycin (VANCOREADY) IVPB 1750 mg/350 mL  Status:   Discontinued        1,750 mg 175 mL/hr over 120 Minutes Intravenous Every 24 hours 05/30/23 2148 06/03/23 1453   05/28/23 1800  vancomycin (VANCOREADY) IVPB 1250 mg/250 mL  Status:  Discontinued        1,250 mg 166.7 mL/hr over 90 Minutes Intravenous Every 24 hours 05/27/23 1707 05/28/23 1210   05/28/23 1800  Vancomycin (VANCOCIN) 1,250 mg in sodium chloride 0.9 % 250 mL IVPB  Status:  Discontinued        1,250 mg 166.7 mL/hr over 90 Minutes Intravenous Every 24 hours 05/28/23 1209 05/30/23 2148   05/27/23 2300  cefTRIAXone (ROCEPHIN) 1 g in sodium chloride 0.9 % 100 mL IVPB  Status:  Discontinued        1 g 200 mL/hr over 30 Minutes Intravenous Every 24 hours 05/27/23 0147 05/28/23 1112   05/27/23 1800  vancomycin (VANCOREADY) IVPB 2000 mg/400 mL        2,000 mg 200 mL/hr over 120 Minutes Intravenous  Once 05/27/23 1707 05/27/23 2026   05/26/23 2315  cefTRIAXone (ROCEPHIN) 1 g in sodium chloride 0.9 % 100 mL IVPB        1 g 200 mL/hr over 30 Minutes Intravenous  Once 05/26/23 2308 05/26/23 2346         Subjective: Patient seen and examined.  She has no complaints.  She was about to start working with PT today.  Objective: Vitals:   06/09/23 0851 06/09/23 1504 06/09/23 2026 06/10/23 0447  BP: 132/77 106/61 113/71 116/83  Pulse: (!) 102 93 79 68  Resp: 16 18 18 19   Temp: 98 F (36.7 C) 97.8 F (36.6 C) 98 F (36.7 C) 97.8 F (36.6 C)  TempSrc: Oral  Oral Oral  SpO2: 93% 94% 94% 95%  Weight:      Height:        Intake/Output Summary (Last 24 hours) at 06/10/2023 0759 Last data filed at 06/10/2023 0016 Gross per 24 hour  Intake 120 ml  Output 1200 ml  Net -1080 ml   Filed Weights   06/03/23 0355 06/06/23 0446 06/08/23 0421  Weight: 100.7 kg 97.5 kg 97.5 kg    Examination:  General exam: Appears calm and comfortable  Respiratory system: Clear to auscultation. Respiratory effort normal. Cardiovascular system: S1 & S2 heard, RRR. No JVD, murmurs, rubs, gallops or  clicks. No pedal edema. Gastrointestinal system: Abdomen is nondistended, soft and nontender. No organomegaly or masses felt. Normal bowel sounds heard. Central nervous system: Alert and oriented. No focal neurological deficits. Extremities: Symmetric 5 x 5 power. Skin: No rashes, lesions or ulcers.    Data Reviewed: I have personally reviewed following labs and imaging studies  CBC: Recent Labs  Lab 06/05/23 0325 06/08/23 0223 06/10/23 0221  WBC 6.8 6.4 6.1  HGB 13.2 13.1 12.9  HCT 39.8 40.0 39.6  MCV 85.2 85.8 86.1  PLT 177 175 168   Basic Metabolic Panel: Recent Labs  Lab 06/05/23 0325 06/06/23 0828 06/08/23 0223  NA 137 138 138  K 3.3* 3.5 3.5  CL 101 98 100  CO2 28 31 29   GLUCOSE 151* 95 110*  BUN 8 7* 8  CREATININE 0.75 0.65 0.64  CALCIUM 8.2* 8.4* 8.5*   GFR: Estimated Creatinine Clearance: 66.2 mL/min (by C-G formula based on SCr of 0.64 mg/dL). Liver Function Tests: No results for input(s): "AST", "ALT", "ALKPHOS", "BILITOT", "PROT", "ALBUMIN" in the last 168 hours. No results for input(s): "LIPASE", "AMYLASE" in the last 168 hours. No results for input(s): "AMMONIA" in the last 168 hours. Coagulation Profile: No results for input(s): "INR", "PROTIME" in the last 168 hours. Cardiac Enzymes: No results for input(s): "CKTOTAL", "CKMB", "CKMBINDEX", "TROPONINI" in the last 168 hours. BNP (last 3 results) No results for input(s): "PROBNP" in the last 8760 hours. HbA1C: No results for input(s): "HGBA1C" in the last 72 hours. CBG: Recent Labs  Lab 06/05/23 1539  GLUCAP 131*   Lipid Profile: No results for input(s): "CHOL", "HDL", "LDLCALC", "TRIG", "CHOLHDL", "LDLDIRECT" in the last 72 hours. Thyroid Function Tests: No results for input(s): "TSH", "T4TOTAL", "FREET4", "T3FREE", "THYROIDAB" in the last 72 hours. Anemia Panel: No results for input(s): "VITAMINB12", "FOLATE", "FERRITIN", "TIBC", "IRON", "RETICCTPCT" in the last 72 hours. Sepsis Labs: No  results for input(s): "PROCALCITON", "LATICACIDVEN" in the last 168 hours.  No results found for this or any previous visit (from the past 240 hours).    Radiology Studies: No results found.   Scheduled Meds:  apixaban  5 mg Oral BID   Chlorhexidine Gluconate Cloth  6 each Topical Daily   gabapentin  200 mg Oral TID   hydrocerin   Topical BID   metoprolol tartrate  25 mg Oral BID   nystatin   Topical Daily   polyethylene glycol  17 g Oral Daily   pramipexole  0.25 mg Oral TID   sodium chloride flush  10-40 mL Intracatheter Q12H   Continuous Infusions:  gentamicin 230 mg (06/09/23 1507)   vancomycin 1,500 mg (06/09/23 0853)     LOS: 14 days   Hughie Closs, MD [facility]  06/10/2023, 7:59 AM   *Please note that this is a verbal dictation therefore any spelling or grammatical errors are due to the "Dragon Medical One" system interpretation.  Please page via Amion and do not message via secure chat for urgent patient care matters. Secure chat can be used for non urgent patient care matters.  How to contact the [facility] Attending or Consulting provider 7A - 7P or covering provider during after hours 7P -7A, for this patient?  Check the care team in [facility] and look for a) attending/consulting TRH provider listed and b) the [facility] team listed. Page or secure chat 7A-7P. Log into [email or web address] and use [place]'s universal password to access. If you do not have the password, please contact the hospital operator. Locate the [facility] provider you are looking for under [facility] and page to a number that you can be directly reached. If you still have difficulty reaching the provider, please page the [facility] (Director on Call) for the Hospitalists listed on amion for assistance.

## 2023-06-10 NOTE — TOC Transition Note (Signed)
 Transition of Care [facility]) - Discharge Note   Patient Details  Name: Denise Kelly MRN: [id number] Date of Birth: [birth year]  Transition of Care [facility]) CM/SW Contact:  Taeya Theall A [place], LCSW Phone Number: 06/10/2023, 1:04 PM   Clinical Narrative:     [name]  Anticipated DC date: 06/10/23  Family notified: [name]  Transport by: [name]      Per MD patient ready for DC to [facility] . RN, patient, patient's family, and facility notified of DC. Discharge Summary and FL2 sent to facility. RN to call report prior to discharge ( [phone], room 11). DC packet on chart. Ambulance transport requested for patient. CSW also notified [name] at [facility] of pt's DC and she stated they would follow pt in the community.     CSW will sign off for now as social work intervention is no longer needed. Please consult [place] again if new needs arise.   Final next level of care: Skilled Nursing Facility Barriers to Discharge: Barriers Resolved   Patient Goals and CMS Choice            Discharge Placement              Patient chooses bed at:  [facility]) Patient to be transferred to facility by: [facility] Name of family member notified: [facility] [place] Patient and family notified of of transfer: 06/10/23  Discharge Plan and Services Additional resources added to the After Visit Summary for   In-house Referral: Clinical Social Work                                   Social Drivers of Health (SDOH) Interventions SDOH Screenings   Food Insecurity: No Food Insecurity (05/27/2023)  Housing: Low Risk  (05/27/2023)  Transportation Needs: No Transportation Needs (05/27/2023)  Utilities: Not At Risk (05/27/2023)  Depression (PHQ2-9): Low Risk  (01/09/2019)  Social Connections: Socially Isolated (05/27/2023)  Tobacco Use: Medium Risk (05/26/2023)     Readmission Risk Interventions    04/08/2023    1:02 PM 04/06/2023    3:38 PM 05/02/2022   11:19 AM   Readmission Risk Prevention Plan  Transportation Screening Complete Complete Complete  PCP or Specialist Appt within 3-5 Days Complete Complete   HRI or Home Care Consult Complete Complete   Social Work Consult for Recovery Care Planning/Counseling Complete Complete   Palliative Care Screening Complete Not Applicable   Medication Review Oceanographer) Complete Complete Complete  PCP or Specialist appointment within 3-5 days of discharge   Complete  HRI or Home Care Consult   Complete  SW Recovery Care/Counseling Consult   Complete  Palliative Care Screening   Not Applicable  Skilled Nursing Facility   Complete

## 2023-06-10 NOTE — Progress Notes (Signed)
 Physical Therapy Treatment Patient Details Name: Denise Kelly MRN: [id number] DOB: 30-Dec-1940 Today's Date: 06/10/2023   History of Present Illness 83 y.o. presents to [facility] 05/26/23 due to weeping wound in the RLE found at living facility sitting in feces and urine. Admitted with acute metabolic encephalopathy likely 2/2 UTI. CTA with signs of consolidation of R lower lobe. Pt also with multi organism bacteremia w/ concerns of endocarditis. Recent admit 2/4, 1/17 and 1/22 for UTI and BLE cellulitis. PMHx: alcohol abuse, anxiety/depression, GERD, HTN, HOH, obesity, chronic diastolic CHF, PE    PT Comments  Pt in bed upon arrival and agreeable to PT session. Limited PT session as pt was having severe pain in left armpit and had difficulty concentrating on exercises due to hyper-fixation. Pt declined bed mobility or transfers due to the pain. She was agreeable to perform exercises in supine to continue working on strengthening. Pt was agreeable to work on getting to the chair in future sessions with emphasis on the importance of continued mobility. Pt is progressing slowly towards goals. Acute PT to follow.      If plan is discharge home, recommend the following: A lot of help with walking and/or transfers;Assistance with cooking/housework;Assist for transportation;Supervision due to cognitive status   Can travel by private vehicle     No  Equipment Recommendations  Wheelchair cushion (measurements PT);Wheelchair (measurements PT)       Precautions / Restrictions Precautions Precautions: Fall Recall of Precautions/Restrictions: Impaired Restrictions Weight Bearing Restrictions Per Provider Order: No     Mobility  Bed Mobility  General bed mobility comments: pt declined bed mobility/transfers         Communication Communication Communication: Impaired Factors Affecting Communication: Hearing impaired  Cognition Arousal: Alert Behavior During Therapy: Anxious   PT - Cognitive  impairments: No family/caregiver present to determine baseline    PT - Cognition Comments: hyper-fixated on pain in left armpit. Very fearful of falling Following commands: Impaired Following commands impaired: Only follows one step commands consistently    Cueing Cueing Techniques: Verbal cues, Tactile cues  Exercises General Exercises - Lower Extremity Ankle Circles/Pumps: AROM, Both, 10 reps, Supine Quad Sets: Strengthening, Both, 5 reps, Supine Gluteal Sets: AROM, Both, 5 reps, Supine Heel Slides: AROM, 5 reps, Both, Supine Hip ABduction/ADduction: AROM, Both, 5 reps, Supine Straight Leg Raises: AROM, Both, 5 reps, Supine Other Exercises Other Exercises: x5 bridges in supine, unable to clear hips    General Comments General comments (skin integrity, edema, etc.): VSS on RA      Pertinent Vitals/Pain Pain Assessment Pain Assessment: Faces Faces Pain Scale: Hurts whole lot Pain Location: left armpit Pain Descriptors / Indicators: Discomfort, Grimacing, Moaning Pain Intervention(s): Limited activity within patient's tolerance, Monitored during session, Repositioned, Patient requesting pain meds-RN notified     PT Goals (current goals can now be found in the care plan section) Acute Rehab PT Goals PT Goal Formulation: With patient Time For Goal Achievement: 06/24/23 Potential to Achieve Goals: Good Progress towards PT goals: Progressing toward goals    Frequency    Min 2X/week       AM-PAC PT "6 Clicks" Mobility   Outcome Measure  Help needed turning from your back to your side while in a flat bed without using bedrails?: A Little Help needed moving from lying on your back to sitting on the side of a flat bed without using bedrails?: A Little Help needed moving to and from a bed to a chair (including a wheelchair)?: A Lot  Help needed standing up from a chair using your arms (e.g., wheelchair or bedside chair)?: A Lot Help needed to walk in hospital room?:  Total Help needed climbing 3-5 steps with a railing? : Total 6 Click Score: 12    End of Session   Activity Tolerance: Patient limited by pain Patient left: in bed;with call bell/phone within reach;with bed alarm set Nurse Communication: Mobility status;Patient requests pain meds PT Visit Diagnosis: Other abnormalities of gait and mobility (R26.89);Muscle weakness (generalized) (M62.81)     Time: 1610-9604 PT Time Calculation (min) (ACUTE ONLY): 13 min  Charges:    $Therapeutic Exercise: 8-22 mins PT General Charges $$ ACUTE PT VISIT: 1 Visit                    Hilton Cork, PT, DPT Secure Chat Preferred  Rehab Office [phone]   Arturo Morton Brion Aliment 06/10/2023, 10:24 AM

## 2023-06-10 NOTE — Plan of Care (Signed)

## 2023-06-10 NOTE — TOC Progression Note (Signed)
 Transition of Care [facility]) - Progression Note    Patient Details  Name: Denise Kelly MRN: [id number] Date of Birth: August 20, 1940  [facility]) CM/SW Contact  Rolfe Hartsell A [place], LCSW Phone Number: 06/10/2023, 11:17 AM  Clinical Narrative:     CSW was informed by [name] that pt's authorization was approved. Pt can DC today to facility, bed available. Provider notified. EDD today.    TOC will continue to follow.   Expected Discharge Plan: Skilled Nursing Facility Barriers to Discharge: SNF Pending bed offer, Continued Medical Work up, English as a second language teacher  Expected Discharge Plan and Services In-house Referral: Clinical Social Work     Living arrangements for the past 2 months: Independent Living Facility Expected Discharge Date: 06/10/23                                     Social Determinants of Health (SDOH) Interventions SDOH Screenings   Food Insecurity: No Food Insecurity (05/27/2023)  Housing: Low Risk  (05/27/2023)  Transportation Needs: No Transportation Needs (05/27/2023)  Utilities: Not At Risk (05/27/2023)  Depression (PHQ2-9): Low Risk  (01/09/2019)  Social Connections: Socially Isolated (05/27/2023)  Tobacco Use: Medium Risk (05/26/2023)    Readmission Risk Interventions    04/08/2023    1:02 PM 04/06/2023    3:38 PM 05/02/2022   11:19 AM  Readmission Risk Prevention Plan  Transportation Screening Complete Complete Complete  PCP or Specialist Appt within 3-5 Days Complete Complete   HRI or Home Care Consult Complete Complete   Social Work Consult for Recovery Care Planning/Counseling Complete Complete   Palliative Care Screening Complete Not Applicable   Medication Review Oceanographer) Complete Complete Complete  PCP or Specialist appointment within 3-5 days of discharge   Complete  HRI or Home Care Consult   Complete  SW Recovery Care/Counseling Consult   Complete  Palliative Care Screening   Not Applicable  Skilled Nursing  Facility   Complete

## 2023-06-12 DIAGNOSIS — Z86711 Personal history of pulmonary embolism: Secondary | ICD-10-CM | POA: Diagnosis not present

## 2023-06-12 DIAGNOSIS — I10 Essential (primary) hypertension: Secondary | ICD-10-CM | POA: Diagnosis not present

## 2023-06-12 DIAGNOSIS — M6281 Muscle weakness (generalized): Secondary | ICD-10-CM | POA: Diagnosis not present

## 2023-06-12 DIAGNOSIS — R110 Nausea: Secondary | ICD-10-CM | POA: Diagnosis not present

## 2023-06-12 DIAGNOSIS — G894 Chronic pain syndrome: Secondary | ICD-10-CM | POA: Diagnosis not present

## 2023-06-12 DIAGNOSIS — R7881 Bacteremia: Secondary | ICD-10-CM | POA: Diagnosis not present

## 2023-06-12 DIAGNOSIS — I5032 Chronic diastolic (congestive) heart failure: Secondary | ICD-10-CM | POA: Diagnosis not present

## 2023-06-12 DIAGNOSIS — E538 Deficiency of other specified B group vitamins: Secondary | ICD-10-CM | POA: Diagnosis not present

## 2023-06-15 DIAGNOSIS — I10 Essential (primary) hypertension: Secondary | ICD-10-CM | POA: Diagnosis not present

## 2023-06-15 DIAGNOSIS — I5032 Chronic diastolic (congestive) heart failure: Secondary | ICD-10-CM | POA: Diagnosis not present

## 2023-06-15 DIAGNOSIS — R110 Nausea: Secondary | ICD-10-CM | POA: Diagnosis not present

## 2023-06-15 DIAGNOSIS — E538 Deficiency of other specified B group vitamins: Secondary | ICD-10-CM | POA: Diagnosis not present

## 2023-06-15 DIAGNOSIS — R7881 Bacteremia: Secondary | ICD-10-CM | POA: Diagnosis not present

## 2023-06-15 DIAGNOSIS — M6281 Muscle weakness (generalized): Secondary | ICD-10-CM | POA: Diagnosis not present

## 2023-06-15 DIAGNOSIS — Z86711 Personal history of pulmonary embolism: Secondary | ICD-10-CM | POA: Diagnosis not present

## 2023-06-15 DIAGNOSIS — G894 Chronic pain syndrome: Secondary | ICD-10-CM | POA: Diagnosis not present

## 2023-06-17 ENCOUNTER — Telehealth: Payer: Self-pay | Admitting: Pharmacist

## 2023-06-17 DIAGNOSIS — F331 Major depressive disorder, recurrent, moderate: Secondary | ICD-10-CM | POA: Diagnosis not present

## 2023-06-17 DIAGNOSIS — F4322 Adjustment disorder with anxiety: Secondary | ICD-10-CM | POA: Diagnosis not present

## 2023-06-17 NOTE — Telephone Encounter (Signed)
 Patient is currently seeing  Drawn at 747, hung at [facility] [phone] option #7 Dione  VAn 1500 daily + Gent 230 daily  Not a trough??? Gent 2100 747; 2.5   First level 707am before dose; 27th. 11am everyday, trough Called this mornig. 706am. Call lab and see what lab;   Monday next dose; first dose; someoen made a decision and schedule a 48 hours ; discontinue q24 then q48; NP amber brown q48h.    Switch abck to q24 hour and trough was not right.  Vanc - adjsut to 1 gm q12h.

## 2023-06-19 DIAGNOSIS — F4320 Adjustment disorder, unspecified: Secondary | ICD-10-CM | POA: Diagnosis not present

## 2023-06-22 ENCOUNTER — Ambulatory Visit (INDEPENDENT_AMBULATORY_CARE_PROVIDER_SITE_OTHER): Payer: Medicare (Managed Care) | Admitting: Internal Medicine

## 2023-06-22 ENCOUNTER — Other Ambulatory Visit: Payer: Self-pay

## 2023-06-22 ENCOUNTER — Telehealth: Payer: Self-pay

## 2023-06-22 ENCOUNTER — Encounter: Payer: Self-pay | Admitting: Internal Medicine

## 2023-06-22 VITALS — BP 113/77 | HR 89 | Resp 16 | Ht 67.0 in | Wt 214.0 lb

## 2023-06-22 DIAGNOSIS — I38 Endocarditis, valve unspecified: Secondary | ICD-10-CM | POA: Diagnosis not present

## 2023-06-22 NOTE — Telephone Encounter (Signed)
 Called [facility] and requested they send lab results as patient is on Vanc/Gentmycn and hospital ordered labs to be drawn. They will send with patient or fax them if already on the way to [facility].

## 2023-06-22 NOTE — Patient Instructions (Addendum)
 Continue abx till 4/28. Please send labs Wound care for leg wound F/u with ID on 4/28

## 2023-06-22 NOTE — Telephone Encounter (Signed)
 Sent paperwork with patient and called facility [facility] to advise no changes to be made. Follow up scheduled and written on sheet for facility.

## 2023-06-22 NOTE — Progress Notes (Signed)
 Patient: Denise Kelly  DOB: 1940/06/24 MRN: [id number] PCP: Eloisa Northern, MD    Patient Active Problem List   Diagnosis Date Noted   Urinary tract infection 05/27/2023   Acute cystitis 04/03/2023   Lower extremity cellulitis 04/03/2023   Bilateral lower extremity pain 05/21/2022   FTT (failure to thrive) in adult 05/01/2022   Chronic diastolic CHF (congestive heart failure) (HCC) 04/30/2022   GERD without esophagitis 04/30/2022   Right hip pain 04/30/2022   Generalized weakness 04/28/2022   Cellulitis of bilateral lower extremities 04/20/2022   Lymphedema 04/20/2022   Personal history of thromboembolic disease 40/98/1191   History of pulmonary embolism 04/20/2022   UTI (urinary tract infection) 01/13/2021   Acute metabolic encephalopathy 01/13/2021   Upper GI bleed 01/23/2018   Leukocytosis 01/23/2018   Physical deconditioning 01/23/2018   Pulmonary embolism (HCC) 01/04/2018   Acute encephalopathy 09/28/2017   Constipation, slow transit 09/28/2017   Unsteady gait 04/28/2017   Spondylolisthesis 07/17/2016   Lymphedema of both lower extremities 07/17/2016   Pain syndrome, chronic    Chronic depression 05/23/2016   Chronic back pain 02/25/2016   Lesion of liver 02/25/2016   History of fracture of left hip 11/28/2015   Fall 11/28/2015   RLS (restless legs syndrome) 11/28/2015   Hypokalemia 03/14/2014   Confusion 03/14/2014   Recurrent UTI 03/14/2014   Effusion of right knee 02/22/2014   Alcohol dependence (HCC) 02/22/2014   Substance induced mood disorder (HCC) 02/22/2014   Hepatic encephalopathy (HCC)    Altered mental status 02/21/2014   SIRS (systemic inflammatory response syndrome) (HCC) 02/21/2014   Bacteremia    S/P total knee arthroplasty 01/02/2014   Essential hypertension 11/10/2013   LAFB (left anterior fascicular block) 11/10/2013   Obesity, unspecified 11/10/2013     Subjective:  Denise Kelly is a 83 y.o. female with past medical history of  alcohol abuse, anxiety/depression, panic attacks, hard of hearing presents for hospital follow-up of a faecalis and staph lugdunensis methicillin-resistant bacteremia in native mitral valve endocarditis secondary to right lower lobe wound.  Patient was discharged on gentamicin and vancomycin x 6 weeks EOT 4/28. - Patient had presented with confusion from skilled facility.  On arrival WBC 17K.  Blood cultures from admission grew 1/2 sets staph lugdunensis methicillin-resistant, staph epi and E faecalis.  Repeat blood culture on 3/11 and 3/13 no growth.  TEE showed mass on mitral leaflet.  CTS recommend medical management.  PICC placed. Today/11/25  Review of Systems  All other systems reviewed and are negative.   Past Medical History:  Diagnosis Date   Alcohol abuse    ETOH and xanax   Anxiety    Panic attacks    Arthritis    Cancer (HCC)    skin- basal , squamous , melonoma- right hand   Cellulitis    right leg   Complication of anesthesia 2009   "felt drunk for a week after"- AVM- both times- felt drunk   Depression    Encephalopathy    GERD (gastroesophageal reflux disease)    HOH (hard of hearing)    HOH (hard of hearing)    Hypertension    not on mediacations   Palpitations    PONV (postoperative nausea and vomiting)    Spondylolisthesis of lumbosacral region    UTI (urinary tract infection)    frequently    Outpatient Medications Prior to Visit  Medication Sig Dispense Refill   acetaminophen (TYLENOL) 325 MG tablet Take 650 mg by  mouth in the morning and at bedtime.     apixaban (ELIQUIS) 5 MG TABS tablet Take 1 tablet (5 mg total) by mouth 2 (two) times daily. 60 tablet 0   cyanocobalamin (VITAMIN B12) 1000 MCG tablet Take 1,000 mcg by mouth daily.     furosemide (LASIX) 20 MG tablet Take 1 tablet (20 mg total) by mouth daily.     gabapentin (NEURONTIN) 100 MG capsule Take 2 capsules (200 mg total) by mouth 3 (three) times daily. (Patient taking differently: Take 100 mg  by mouth 3 (three) times daily.)     gentamicin (GARAMYCIN) IVPB Inject 230 mg into the vein daily. Indication:  Endocarditis  First Dose: Yes Last Day of Therapy:  07/13/23 Labs - Sunday/Monday:  CBC/D, BMP, and gentamicin trough. Labs - Thursday:  BMP and gentamicin trough Labs - Once weekly: ESR and CRP 39 Units 0   metoprolol tartrate (LOPRESSOR) 25 MG tablet Take 1 tablet (25 mg total) by mouth 2 (two) times daily. 60 tablet 0   ondansetron (ZOFRAN) 4 MG tablet Take 1 tablet (4 mg total) by mouth every 8 (eight) hours as needed for nausea or vomiting. 30 tablet 0   phenazopyridine (PYRIDIUM) 100 MG tablet Take 1 tablet (100 mg total) by mouth 3 (three) times daily as needed (dysurea). 10 tablet 0   potassium chloride SA (KLOR-CON M) 10 MEQ tablet Take 1 tablet (10 mEq total) by mouth daily.     pramipexole (MIRAPEX) 0.25 MG tablet Take 1 tablet (0.25 mg total) by mouth 3 (three) times daily. 90 tablet 0   vancomycin IVPB Inject 1,500 mg into the vein daily. Indication:  Endocarditis First Dose: Yes Last Day of Therapy:  07/13/23 Labs - Sunday/Monday:  CBC/D, BMP, and vancomycin trough. Labs - Thursday:  BMP and vancomycin trough Labs - Once weekly: ESR and CRP 39 Units 0   No facility-administered medications prior to visit.     Allergies  Allergen Reactions   Sulfa Antibiotics Hives, Swelling and Other (See Comments)    Facial/eye swelling    Coreg [Carvedilol] Other (See Comments)    Memory loss   Motrin [Ibuprofen] Palpitations   Toprol Xl [Metoprolol Tartrate] Cough   Doxycycline Hyclate Other (See Comments)    HEARTBURN   Flovent Hfa [Fluticasone] Itching   Statins Other (See Comments)    MENTAL STATUS CHANGE    Social History   Tobacco Use   Smoking status: Former    Current packs/day: 0.00    Average packs/day: 1 pack/day for 20.0 years (20.0 ttl pk-yrs)    Types: Cigarettes    Start date: 03/17/1972    Quit date: 03/17/1992    Years since quitting: 31.2    Smokeless tobacco: Never   Tobacco comments:    quit smoking many years ago .  quit 1994  Vaping Use   Vaping status: Never Used  Substance Use Topics   Alcohol use: No    Comment: unable to get alcohol per family not since September   Drug use: No    Family History  Problem Relation Age of Onset   Hypertension Other    Cancer Mother    Cancer Father     Objective:  There were no vitals filed for this visit. There is no height or weight on file to calculate BMI.  Physical Exam Constitutional:      Appearance: Normal appearance.  HENT:     Head: Normocephalic and atraumatic.     Right Ear: Tympanic  membrane normal.     Left Ear: Tympanic membrane normal.     Nose: Nose normal.     Mouth/Throat:     Mouth: Mucous membranes are moist.  Eyes:     Extraocular Movements: Extraocular movements intact.     Conjunctiva/sclera: Conjunctivae normal.     Pupils: Pupils are equal, round, and reactive to light.  Cardiovascular:     Rate and Rhythm: Normal rate and regular rhythm.     Heart sounds: No murmur heard.    No friction rub. No gallop.  Pulmonary:     Effort: Pulmonary effort is normal.     Breath sounds: Normal breath sounds.  Abdominal:     General: Abdomen is flat.     Palpations: Abdomen is soft.  Musculoskeletal:        General: Normal range of motion.  Skin:    General: Skin is dry.     Comments: Ruddy appearing b/l le. Left leg wound  Neurological:     General: No focal deficit present.     Mental Status: She is alert and oriented to person, place, and time.  Psychiatric:        Mood and Affect: Mood normal.     Lab Results: Lab Results  Component Value Date   WBC 6.1 06/10/2023   HGB 12.9 06/10/2023   HCT 39.6 06/10/2023   MCV 86.1 06/10/2023   PLT 168 06/10/2023    Lab Results  Component Value Date   CREATININE 0.64 06/08/2023   BUN 8 06/08/2023   NA 138 06/08/2023   K 3.5 06/08/2023   CL 100 06/08/2023   CO2 29 06/08/2023    Lab  Results  Component Value Date   ALT 24 05/26/2023   AST 36 05/26/2023   ALKPHOS 81 05/26/2023   BILITOT 1.6 (H) 05/26/2023     Assessment & Plan:  #E faecalis and staph lugdunensis(MR) methicillin-resistant bacteremia with native MV endocarditis secondary to right lower will leg wound versus gi translocation  -TEE showed mass on MV leaflet On gent and vanc x 6 weeks->EOT 4/28 -F/U on 4/28   #medication monitoring #PICC 06/11/23: SCr 0.64, ESR 21, CRP 0.67, gent trough <0.2, vanc trough 8.7 06/15/23: WBC 8.3, SCr 0.73, gent trough 2.8, vanc trough 9.3. pharmcy called [facility] on 4/2 and noted there was differeing inromation as far gent when gent was last administered. In addiotn vanc dosing was changed 1500 mg daily to 1gm q12h. Next gent labs due 06/22/23(6-14 hours afer last infucion) 4/4 labs scr 0.69,vanc torugh 22.7, crp 0.55, wbc 7.2   # left leg wound likely consequence of PVD Wound care  Danelle Earthly, MD [facility]   06/22/23  9:16 AM   I have personally spent 45 minutes involved in face-to-face and non-face-to-face activities for this patient on the day of the visit. Professional time spent includes the following activities: Preparing to see the patient (review of tests), Obtaining and/or reviewing separately obtained history (admission/discharge record), Performing a medically appropriate examination and/or evaluation , Ordering medications/tests/procedures, referring and communicating with other health care professionals, Documenting clinical information in the EMR, Independently interpreting results (not separately reported), Communicating results to the patient/family/caregiver, Counseling and educating the patient/family/caregiver and Care coordination (not separately reported).

## 2023-06-24 ENCOUNTER — Telehealth: Payer: Self-pay | Admitting: Pharmacist

## 2023-06-24 NOTE — Telephone Encounter (Signed)
 Dionne (Rph with [employer]) called pharmacy today about patient's labs; unable to reach her over the phone. Returned call to pharmacy and spoke with Wilkie Aye who is another Teacher, early years/pre at [employer]. States patient was not given gentamicin on 4/7 but that she cannot tell why; has received on 4/8 and 4/9. Did not have any updated labs; will check back in on Friday or Monday.   Vancomycin trough was elevated on 4/4 at 22.7, so dose was supposed to be decreased from 1g every 12 hours to 750 mg every 12 hours. Vancomycin was then not given on 4/4 PM, 4/5 AM, 4/6 or 4/7 but has been given on 4/8 and 4/9.  Trough repeated on 4/8 at 6:00 was < 4 which is not surprising given multiple missed doses. As patient was finally restarted on vancomycin 4/8 AM, provided verbal order for SNF to recheck a vancomycin trough ~9:00am right before next dose is given on either 4/10 or 4/11. Asked Wilkie Aye to call us back once results are available - either on Friday or Monday.    Margarite Gouge, PharmD, CPP, BCIDP, AAHIVP Clinical Pharmacist Practitioner Infectious Diseases Clinical Pharmacist [facility]

## 2023-06-26 DIAGNOSIS — Z95828 Presence of other vascular implants and grafts: Secondary | ICD-10-CM | POA: Diagnosis not present

## 2023-06-26 DIAGNOSIS — G4700 Insomnia, unspecified: Secondary | ICD-10-CM | POA: Diagnosis not present

## 2023-06-26 DIAGNOSIS — M6281 Muscle weakness (generalized): Secondary | ICD-10-CM | POA: Diagnosis not present

## 2023-06-26 DIAGNOSIS — R7881 Bacteremia: Secondary | ICD-10-CM | POA: Diagnosis not present

## 2023-06-30 DIAGNOSIS — E876 Hypokalemia: Secondary | ICD-10-CM | POA: Diagnosis not present

## 2023-06-30 DIAGNOSIS — R7881 Bacteremia: Secondary | ICD-10-CM | POA: Diagnosis not present

## 2023-07-03 DIAGNOSIS — R059 Cough, unspecified: Secondary | ICD-10-CM | POA: Diagnosis not present

## 2023-07-06 DIAGNOSIS — R059 Cough, unspecified: Secondary | ICD-10-CM | POA: Diagnosis not present

## 2023-07-07 DIAGNOSIS — R7881 Bacteremia: Secondary | ICD-10-CM | POA: Diagnosis not present

## 2023-07-07 DIAGNOSIS — R059 Cough, unspecified: Secondary | ICD-10-CM | POA: Diagnosis not present

## 2023-07-07 DIAGNOSIS — U071 COVID-19: Secondary | ICD-10-CM | POA: Diagnosis not present

## 2023-07-07 DIAGNOSIS — E876 Hypokalemia: Secondary | ICD-10-CM | POA: Diagnosis not present

## 2023-07-11 DIAGNOSIS — I10 Essential (primary) hypertension: Secondary | ICD-10-CM | POA: Diagnosis not present

## 2023-07-11 DIAGNOSIS — G894 Chronic pain syndrome: Secondary | ICD-10-CM | POA: Diagnosis not present

## 2023-07-11 DIAGNOSIS — M6281 Muscle weakness (generalized): Secondary | ICD-10-CM | POA: Diagnosis not present

## 2023-07-11 DIAGNOSIS — G2581 Restless legs syndrome: Secondary | ICD-10-CM | POA: Diagnosis not present

## 2023-07-13 ENCOUNTER — Other Ambulatory Visit: Payer: Self-pay

## 2023-07-13 ENCOUNTER — Ambulatory Visit (INDEPENDENT_AMBULATORY_CARE_PROVIDER_SITE_OTHER): Payer: Medicare (Managed Care) | Admitting: Internal Medicine

## 2023-07-13 VITALS — BP 118/74 | HR 78 | Temp 97.7°F

## 2023-07-13 DIAGNOSIS — I38 Endocarditis, valve unspecified: Secondary | ICD-10-CM

## 2023-07-13 NOTE — Progress Notes (Unsigned)
 Pt states she will d/c from SNF before next appt. P: [phone] Julien Odor, RMA

## 2023-07-13 NOTE — Progress Notes (Unsigned)
 Patient Active Problem List   Diagnosis Date Noted   Urinary tract infection 05/27/2023   Acute cystitis 04/03/2023   Lower extremity cellulitis 04/03/2023   Bilateral lower extremity pain 05/21/2022   FTT (failure to thrive) in adult 05/01/2022   Chronic diastolic CHF (congestive heart failure) (HCC) 04/30/2022   GERD without esophagitis 04/30/2022   Right hip pain 04/30/2022   Generalized weakness 04/28/2022   Cellulitis of bilateral lower extremities 04/20/2022   Lymphedema 04/20/2022   Personal history of thromboembolic disease 16/12/9602   History of pulmonary embolism 04/20/2022   UTI (urinary tract infection) 01/13/2021   Acute metabolic encephalopathy 01/13/2021   Upper GI bleed 01/23/2018   Leukocytosis 01/23/2018   Physical deconditioning 01/23/2018   Pulmonary embolism (HCC) 01/04/2018   Acute encephalopathy 09/28/2017   Constipation, slow transit 09/28/2017   Unsteady gait 04/28/2017   Spondylolisthesis 07/17/2016   Lymphedema of both lower extremities 07/17/2016   Pain syndrome, chronic    Chronic depression 05/23/2016   Chronic back pain 02/25/2016   Lesion of liver 02/25/2016   History of fracture of left hip 11/28/2015   Fall 11/28/2015   RLS (restless legs syndrome) 11/28/2015   Hypokalemia 03/14/2014   Confusion 03/14/2014   Recurrent UTI 03/14/2014   Effusion of right knee 02/22/2014   Alcohol  dependence (HCC) 02/22/2014   Substance induced mood disorder (HCC) 02/22/2014   Hepatic encephalopathy (HCC)    Altered mental status 02/21/2014   SIRS (systemic inflammatory response syndrome) (HCC) 02/21/2014   Bacteremia    S/P total knee arthroplasty 01/02/2014   Essential hypertension 11/10/2013   LAFB (left anterior fascicular block) 11/10/2013   Obesity, unspecified 11/10/2013    Patient's Medications  New Prescriptions   No medications on file  Previous Medications   ACETAMINOPHEN  (TYLENOL ) 325 MG TABLET    Take 650 mg by mouth in  the morning and at bedtime.   APIXABAN  (ELIQUIS ) 5 MG TABS TABLET    Take 1 tablet (5 mg total) by mouth 2 (two) times daily.   BUPROPION (WELLBUTRIN XL) 150 MG 24 HR TABLET    Take 150 mg by mouth daily.   CETIRIZINE HCL 10 MG CAPS    Take by mouth.   CYANOCOBALAMIN  (VITAMIN B12) 1000 MCG TABLET    Take 1,000 mcg by mouth daily.   FUROSEMIDE  (LASIX ) 20 MG TABLET    Take 1 tablet (20 mg total) by mouth daily.   GABAPENTIN  (NEURONTIN ) 100 MG CAPSULE    Take 2 capsules (200 mg total) by mouth 3 (three) times daily.   GENTAMICIN  (GARAMYCIN ) IVPB    Inject 230 mg into the vein daily. Indication:  Endocarditis  First Dose: Yes Last Day of Therapy:  07/13/23 Labs - Sunday/Monday:  CBC/D, BMP, and gentamicin  trough. Labs - Thursday:  BMP and gentamicin  trough Labs - Once weekly: ESR and CRP   GUAIFENESIN  (ROBITUSSIN) 100 MG/5ML LIQUID    Take 5 mLs by mouth every 4 (four) hours as needed for cough or to loosen phlegm.   MELATONIN 3 MG TABS TABLET    Take 3 mg by mouth at bedtime.   METOPROLOL  TARTRATE (LOPRESSOR ) 25 MG TABLET    Take 1 tablet (25 mg total) by mouth 2 (two) times daily.   ONDANSETRON  (ZOFRAN ) 4 MG TABLET    Take 1 tablet (4 mg total) by mouth every 8 (eight) hours as needed for nausea or vomiting.   PHENAZOPYRIDINE  (PYRIDIUM ) 100 MG TABLET  Take 1 tablet (100 mg total) by mouth 3 (three) times daily as needed (dysurea).   POTASSIUM CHLORIDE  SA (KLOR-CON  M) 10 MEQ TABLET    Take 1 tablet (10 mEq total) by mouth daily.   PRAMIPEXOLE  (MIRAPEX ) 0.25 MG TABLET    Take 1 tablet (0.25 mg total) by mouth 3 (three) times daily.   VANCOMYCIN  IVPB    Inject 1,500 mg into the vein daily. Indication:  Endocarditis First Dose: Yes Last Day of Therapy:  07/13/23 Labs - Sunday/Monday:  CBC/D, BMP, and vancomycin  trough. Labs - Thursday:  BMP and vancomycin  trough Labs - Once weekly: ESR and CRP  Modified Medications   No medications on file  Discontinued Medications   No medications on file     Subjective: Denise Kelly is a 83 y.o. female with past medical history of alcohol  abuse, anxiety/depression, panic attacks, hard of hearing presents for hospital follow-up of a faecalis and staph lugdunensis methicillin-resistant bacteremia in native mitral valve endocarditis secondary to right lower lobe wound.  Patient was discharged on gentamicin  and vancomycin  x 6 weeks EOT 4/28. - Patient had presented with confusion from skilled facility.  On arrival WBC 17K.  Blood cultures from admission grew 1/2 sets staph lugdunensis methicillin-resistant, staph epi and E faecalis.  Repeat blood culture on 3/11 and 3/13 no growth.  TEE showed mass on mitral leaflet.  CTS recommend medical management.  PICC placed. Today/11/25 Today 07/13/23: doing well off of abx  Review of Systems: ROS  Past Medical History:  Diagnosis Date   Alcohol  abuse    ETOH and xanax    Anxiety    Panic attacks    Arthritis    Cancer (HCC)    skin- basal , squamous , melonoma- right hand   Cellulitis    right leg   Complication of anesthesia 2009   "felt drunk for a week after"- AVM- both times- felt drunk   Depression    Encephalopathy    GERD (gastroesophageal reflux disease)    HOH (hard of hearing)    HOH (hard of hearing)    Hypertension    not on mediacations   Palpitations    PONV (postoperative nausea and vomiting)    Spondylolisthesis of lumbosacral region    UTI (urinary tract infection)    frequently    Social History   Tobacco Use   Smoking status: Former    Current packs/day: 0.00    Average packs/day: 1 pack/day for 20.0 years (20.0 ttl pk-yrs)    Types: Cigarettes    Start date: 03/17/1972    Quit date: 03/17/1992    Years since quitting: 31.3   Smokeless tobacco: Never   Tobacco comments:    quit smoking many years ago .  quit 1994  Vaping Use   Vaping status: Never Used  Substance Use Topics   Alcohol  use: No    Comment: unable to get alcohol  per family not since September    Drug use: No    Family History  Problem Relation Age of Onset   Hypertension Other    Cancer Mother    Cancer Father     Allergies  Allergen Reactions   Sulfa Antibiotics Hives, Swelling and Other (See Comments)    Facial/eye swelling    Coreg [Carvedilol] Other (See Comments)    Memory loss   Motrin [Ibuprofen] Palpitations   Toprol  Xl [Metoprolol  Tartrate] Cough   Doxycycline  Hyclate Other (See Comments)    HEARTBURN   Flovent Hfa [Fluticasone]  Itching   Statins Other (See Comments)    MENTAL STATUS CHANGE    Health Maintenance  Topic Date Due   COVID-19 Vaccine (1) Never done   DTaP/Tdap/Td (1 - Tdap) Never done   Pneumonia Vaccine 18+ Years old (1 of 2 - PCV) Never done   Zoster Vaccines- Shingrix (1 of 2) Never done   DEXA SCAN  Never done   Medicare Annual Wellness (AWV)  01/28/2023   INFLUENZA VACCINE  10/16/2023   HPV VACCINES  Aged Out   Meningococcal B Vaccine  Aged Out   Hepatitis C Screening  Discontinued    Objective:  Vitals:   07/13/23 1515  BP: 118/74  Pulse: 78  Temp: 97.7 F (36.5 C)  TempSrc: Temporal  SpO2: 98%   There is no height or weight on file to calculate BMI.  Physical Exam Physical Exam   Lab Results Lab Results  Component Value Date   WBC 6.1 06/10/2023   HGB 12.9 06/10/2023   HCT 39.6 06/10/2023   MCV 86.1 06/10/2023   PLT 168 06/10/2023    Lab Results  Component Value Date   CREATININE 0.64 06/08/2023   BUN 8 06/08/2023   NA 138 06/08/2023   K 3.5 06/08/2023   CL 100 06/08/2023   CO2 29 06/08/2023    Lab Results  Component Value Date   ALT 24 05/26/2023   AST 36 05/26/2023   ALKPHOS 81 05/26/2023   BILITOT 1.6 (H) 05/26/2023    No results found for: "CHOL", "HDL", "LDLCALC", "LDLDIRECT", "TRIG", "CHOLHDL" No results found for: "LABRPR", "RPRTITER" HIV 1 RNA Quant (copies/mL)  Date Value  01/20/2014 <20     Problem List Items Addressed This Visit   None  Results   Assessment/Plan #E faecalis  and staph lugdunensis(MR) methicillin-resistant bacteremia with native MV endocarditis secondary to right lower will leg wound versus gi translocation  -TEE showed mass on MV leaflet On gent and vanc x 6 weeks->EOT 4/28 -F/U in one month to do labs off of abx     #medication monitoring #PICC 06/11/23: SCr 0.64, ESR 21, CRP 0.67, gent trough <0.2, vanc trough 8.7 06/15/23: WBC 8.3, SCr 0.73, gent trough 2.8, vanc trough 9.3. pharmcy called [facility] on 4/2 and noted there was differeing inromation as far gent when gent was last administered. In addiotn vanc dosing was changed 1500 mg daily to 1gm q12h. Next gent labs due 06/22/23(6-14 hours afer last infucion) 4/4 labs scr 0.69,vanc torugh 22.7, crp 0.55, wbc 7.2 07/06/23 labs: wbc6.6, esr 28, scr 1.12, gent peak 9.0 4/23 crp 0.83 gent trough 0.5 Plan: Stop abx Pull picc  Orlie Bjornstad, MD [facility] 07/13/2023, 3:27 PM

## 2023-07-13 NOTE — Patient Instructions (Signed)
 Stop antibiotics Pull picc F/u in on month

## 2023-07-23 DIAGNOSIS — C44719 Basal cell carcinoma of skin of left lower limb, including hip: Secondary | ICD-10-CM | POA: Diagnosis not present

## 2023-07-23 DIAGNOSIS — C44729 Squamous cell carcinoma of skin of left lower limb, including hip: Secondary | ICD-10-CM | POA: Diagnosis not present

## 2023-07-23 DIAGNOSIS — D485 Neoplasm of uncertain behavior of skin: Secondary | ICD-10-CM | POA: Diagnosis not present

## 2023-07-27 DIAGNOSIS — N3000 Acute cystitis without hematuria: Secondary | ICD-10-CM | POA: Diagnosis not present

## 2023-07-27 DIAGNOSIS — Z86718 Personal history of other venous thrombosis and embolism: Secondary | ICD-10-CM | POA: Diagnosis not present

## 2023-07-27 DIAGNOSIS — M6281 Muscle weakness (generalized): Secondary | ICD-10-CM | POA: Diagnosis not present

## 2023-07-27 DIAGNOSIS — R2689 Other abnormalities of gait and mobility: Secondary | ICD-10-CM | POA: Diagnosis not present

## 2023-07-27 DIAGNOSIS — I5032 Chronic diastolic (congestive) heart failure: Secondary | ICD-10-CM | POA: Diagnosis not present

## 2023-07-28 DIAGNOSIS — I5032 Chronic diastolic (congestive) heart failure: Secondary | ICD-10-CM | POA: Diagnosis not present

## 2023-07-28 DIAGNOSIS — M6281 Muscle weakness (generalized): Secondary | ICD-10-CM | POA: Diagnosis not present

## 2023-07-28 DIAGNOSIS — I10 Essential (primary) hypertension: Secondary | ICD-10-CM | POA: Diagnosis not present

## 2023-07-28 DIAGNOSIS — G4700 Insomnia, unspecified: Secondary | ICD-10-CM | POA: Diagnosis not present

## 2023-07-28 DIAGNOSIS — Z86711 Personal history of pulmonary embolism: Secondary | ICD-10-CM | POA: Diagnosis not present

## 2023-07-28 DIAGNOSIS — G2581 Restless legs syndrome: Secondary | ICD-10-CM | POA: Diagnosis not present

## 2023-07-28 DIAGNOSIS — F32A Depression, unspecified: Secondary | ICD-10-CM | POA: Diagnosis not present

## 2023-07-28 DIAGNOSIS — G894 Chronic pain syndrome: Secondary | ICD-10-CM | POA: Diagnosis not present

## 2023-08-04 DIAGNOSIS — N1831 Chronic kidney disease, stage 3a: Secondary | ICD-10-CM | POA: Diagnosis not present

## 2023-08-04 DIAGNOSIS — E876 Hypokalemia: Secondary | ICD-10-CM | POA: Diagnosis not present

## 2023-08-12 ENCOUNTER — Ambulatory Visit: Payer: Medicare (Managed Care) | Admitting: Internal Medicine

## 2023-08-16 DIAGNOSIS — I5032 Chronic diastolic (congestive) heart failure: Secondary | ICD-10-CM | POA: Diagnosis not present

## 2023-08-16 DIAGNOSIS — R2689 Other abnormalities of gait and mobility: Secondary | ICD-10-CM | POA: Diagnosis not present

## 2023-08-16 DIAGNOSIS — M6281 Muscle weakness (generalized): Secondary | ICD-10-CM | POA: Diagnosis not present

## 2023-08-18 ENCOUNTER — Ambulatory Visit: Payer: Medicare (Managed Care) | Admitting: Internal Medicine

## 2023-08-18 ENCOUNTER — Telehealth: Payer: Self-pay

## 2023-08-18 NOTE — Telephone Encounter (Signed)
 Patient declined to come to appointment today. Called [facility], spoke with Zannette, nurse, and requested that they draw cbc, cmp, esr, and crp per Dr. Zelda Hickman. They will fax results to triage.   Denise Kelly, BSN, RN

## 2023-09-02 DIAGNOSIS — I10 Essential (primary) hypertension: Secondary | ICD-10-CM | POA: Diagnosis not present

## 2023-09-02 DIAGNOSIS — G894 Chronic pain syndrome: Secondary | ICD-10-CM | POA: Diagnosis not present

## 2023-09-02 DIAGNOSIS — Z86711 Personal history of pulmonary embolism: Secondary | ICD-10-CM | POA: Diagnosis not present

## 2023-09-02 DIAGNOSIS — N1831 Chronic kidney disease, stage 3a: Secondary | ICD-10-CM | POA: Diagnosis not present

## 2023-09-02 DIAGNOSIS — G2581 Restless legs syndrome: Secondary | ICD-10-CM | POA: Diagnosis not present

## 2023-09-02 DIAGNOSIS — I5032 Chronic diastolic (congestive) heart failure: Secondary | ICD-10-CM | POA: Diagnosis not present

## 2023-09-02 DIAGNOSIS — M6281 Muscle weakness (generalized): Secondary | ICD-10-CM | POA: Diagnosis not present

## 2023-09-08 DIAGNOSIS — C44729 Squamous cell carcinoma of skin of left lower limb, including hip: Secondary | ICD-10-CM | POA: Diagnosis not present

## 2023-09-10 DIAGNOSIS — Z85828 Personal history of other malignant neoplasm of skin: Secondary | ICD-10-CM | POA: Diagnosis not present

## 2023-09-10 DIAGNOSIS — S81802D Unspecified open wound, left lower leg, subsequent encounter: Secondary | ICD-10-CM | POA: Diagnosis not present

## 2023-09-10 DIAGNOSIS — M79662 Pain in left lower leg: Secondary | ICD-10-CM | POA: Diagnosis not present

## 2023-09-10 DIAGNOSIS — I5032 Chronic diastolic (congestive) heart failure: Secondary | ICD-10-CM | POA: Diagnosis not present

## 2023-09-10 DIAGNOSIS — I10 Essential (primary) hypertension: Secondary | ICD-10-CM | POA: Diagnosis not present

## 2023-09-15 DIAGNOSIS — G4700 Insomnia, unspecified: Secondary | ICD-10-CM | POA: Diagnosis not present

## 2023-09-15 DIAGNOSIS — I10 Essential (primary) hypertension: Secondary | ICD-10-CM | POA: Diagnosis not present

## 2023-09-15 DIAGNOSIS — G894 Chronic pain syndrome: Secondary | ICD-10-CM | POA: Diagnosis not present

## 2023-09-15 DIAGNOSIS — I5032 Chronic diastolic (congestive) heart failure: Secondary | ICD-10-CM | POA: Diagnosis not present

## 2023-09-15 DIAGNOSIS — G20A1 Parkinson's disease without dyskinesia, without mention of fluctuations: Secondary | ICD-10-CM | POA: Diagnosis not present

## 2023-09-16 DIAGNOSIS — M6281 Muscle weakness (generalized): Secondary | ICD-10-CM | POA: Diagnosis not present

## 2023-09-16 DIAGNOSIS — I5032 Chronic diastolic (congestive) heart failure: Secondary | ICD-10-CM | POA: Diagnosis not present

## 2023-09-22 DIAGNOSIS — L929 Granulomatous disorder of the skin and subcutaneous tissue, unspecified: Secondary | ICD-10-CM | POA: Diagnosis not present

## 2023-10-13 DIAGNOSIS — L929 Granulomatous disorder of the skin and subcutaneous tissue, unspecified: Secondary | ICD-10-CM | POA: Diagnosis not present

## 2023-10-16 DIAGNOSIS — I5032 Chronic diastolic (congestive) heart failure: Secondary | ICD-10-CM | POA: Diagnosis not present

## 2023-10-16 DIAGNOSIS — M6281 Muscle weakness (generalized): Secondary | ICD-10-CM | POA: Diagnosis not present

## 2023-10-20 DIAGNOSIS — H353112 Nonexudative age-related macular degeneration, right eye, intermediate dry stage: Secondary | ICD-10-CM | POA: Diagnosis not present

## 2023-10-20 DIAGNOSIS — H524 Presbyopia: Secondary | ICD-10-CM | POA: Diagnosis not present

## 2023-10-20 DIAGNOSIS — Z961 Presence of intraocular lens: Secondary | ICD-10-CM | POA: Diagnosis not present

## 2023-10-20 DIAGNOSIS — H353123 Nonexudative age-related macular degeneration, left eye, advanced atrophic without subfoveal involvement: Secondary | ICD-10-CM | POA: Diagnosis not present

## 2023-10-31 DIAGNOSIS — F339 Major depressive disorder, recurrent, unspecified: Secondary | ICD-10-CM | POA: Diagnosis not present

## 2023-10-31 DIAGNOSIS — G4700 Insomnia, unspecified: Secondary | ICD-10-CM | POA: Diagnosis not present

## 2023-10-31 DIAGNOSIS — F4320 Adjustment disorder, unspecified: Secondary | ICD-10-CM | POA: Diagnosis not present

## 2023-11-03 DIAGNOSIS — F4322 Adjustment disorder with anxiety: Secondary | ICD-10-CM | POA: Diagnosis not present

## 2023-11-03 DIAGNOSIS — Z5189 Encounter for other specified aftercare: Secondary | ICD-10-CM | POA: Diagnosis not present

## 2023-11-03 DIAGNOSIS — F32A Depression, unspecified: Secondary | ICD-10-CM | POA: Diagnosis not present

## 2023-11-03 DIAGNOSIS — I5032 Chronic diastolic (congestive) heart failure: Secondary | ICD-10-CM | POA: Diagnosis not present

## 2023-11-03 DIAGNOSIS — G4700 Insomnia, unspecified: Secondary | ICD-10-CM | POA: Diagnosis not present

## 2023-11-03 DIAGNOSIS — L729 Follicular cyst of the skin and subcutaneous tissue, unspecified: Secondary | ICD-10-CM | POA: Diagnosis not present

## 2023-12-20 ENCOUNTER — Emergency Department (HOSPITAL_COMMUNITY)

## 2023-12-20 ENCOUNTER — Encounter (HOSPITAL_COMMUNITY): Payer: Self-pay | Admitting: Emergency Medicine

## 2023-12-20 ENCOUNTER — Emergency Department (HOSPITAL_COMMUNITY)
Admission: EM | Admit: 2023-12-20 | Discharge: 2023-12-21 | Disposition: A | Discharge status: Home / Self Care | Source: Skilled Nursing Facility | Attending: Emergency Medicine | Admitting: Emergency Medicine

## 2023-12-20 DIAGNOSIS — M79661 Pain in right lower leg: Secondary | ICD-10-CM | POA: Diagnosis not present

## 2023-12-20 DIAGNOSIS — M79662 Pain in left lower leg: Secondary | ICD-10-CM | POA: Insufficient documentation

## 2023-12-20 DIAGNOSIS — R300 Dysuria: Secondary | ICD-10-CM | POA: Diagnosis present

## 2023-12-20 DIAGNOSIS — Z7901 Long term (current) use of anticoagulants: Secondary | ICD-10-CM | POA: Diagnosis not present

## 2023-12-20 DIAGNOSIS — I10 Essential (primary) hypertension: Secondary | ICD-10-CM | POA: Insufficient documentation

## 2023-12-20 DIAGNOSIS — E876 Hypokalemia: Secondary | ICD-10-CM | POA: Diagnosis not present

## 2023-12-20 DIAGNOSIS — N390 Urinary tract infection, site not specified: Secondary | ICD-10-CM

## 2023-12-20 LAB — COMPREHENSIVE METABOLIC PANEL WITH GFR
ALT: 11 U/L (ref 0–44)
AST: 20 U/L (ref 15–41)
Albumin: 2.9 g/dL — ABNORMAL LOW (ref 3.5–5.0)
Alkaline Phosphatase: 97 U/L (ref 38–126)
Anion gap: 11 (ref 5–15)
BUN: 12 mg/dL (ref 8–23)
CO2: 30 mmol/L (ref 22–32)
Calcium: 8.7 mg/dL — ABNORMAL LOW (ref 8.9–10.3)
Chloride: 100 mmol/L (ref 98–111)
Creatinine, Ser: 0.9 mg/dL (ref 0.44–1.00)
GFR, Estimated: >60 mL/min (ref >60)
Glucose, Bld: 126 mg/dL — ABNORMAL HIGH (ref 70–99)
Potassium: 3.2 mmol/L — ABNORMAL LOW (ref 3.5–5.1)
Sodium: 141 mmol/L (ref 135–145)
Total Bilirubin: 0.5 mg/dL (ref 0.0–1.2)
Total Protein: 6.6 g/dL (ref 6.5–8.1)

## 2023-12-20 LAB — CBC
HCT: 44.3 % (ref 36.0–46.0)
Hemoglobin: 14.4 g/dL (ref 12.0–15.0)
MCH: 27.1 pg (ref 26.0–34.0)
MCHC: 32.5 g/dL (ref 30.0–36.0)
MCV: 83.4 fL (ref 80.0–100.0)
Platelets: 230 K/uL (ref 150–400)
RBC: 5.31 MIL/uL — ABNORMAL HIGH (ref 3.87–5.11)
RDW: 14.6 % (ref 11.5–15.5)
WBC: 8.8 K/uL (ref 4.0–10.5)
nRBC: 0 % (ref 0.0–0.2)

## 2023-12-20 LAB — I-STAT CG4 LACTIC ACID, ED: Lactic Acid, Venous: 1.6 mmol/L (ref 0.5–1.9)

## 2023-12-20 MED ORDER — LACTATED RINGERS IV BOLUS
500.0000 mL | Freq: Once | INTRAVENOUS | Status: AC
  Administered 2023-12-20: 500 mL via INTRAVENOUS

## 2023-12-20 MED ORDER — MORPHINE SULFATE (PF) 4 MG/ML IV SOLN
4.0000 mg | Freq: Once | INTRAVENOUS | Status: AC
  Administered 2023-12-20: 4 mg via INTRAVENOUS
  Filled 2023-12-20: qty 1

## 2023-12-20 NOTE — ED Provider Notes (Signed)
[place] EMERGENCY DEPARTMENT AT [facility] Provider Note   CSN: [id number] Arrival date & time: 12/20/23  2114     Patient presents with: Urinary Tract Infection   Denise Kelly is a 83 y.o. female.    Urinary Tract Infection    Patient has a history of palpitations acid reflux encephalopathy hypertension depression anxiety urinary tract infection spondylolisthesis of her lumbosacral region.  Patient resides at a nursing home.  She presents to the ED with complaints of possible urinary tract infection and leg pain.  Patient states she has had discomfort with urination.  She denies any abdominal pain.  She is also complaining of aching pain in her legs.  Patient is not sure how long that is been going on for.  She states it has been ongoing since she has been in the hospital.  Records indicate that was back in March of this year.  No known injury.  No known fevers or chills.  Prior to Admission medications   Medication Sig Start Date End Date Taking? Authorizing Provider  acetaminophen  (TYLENOL ) 325 MG tablet Take 650 mg by mouth in the morning and at bedtime.    [provider]  apixaban  (ELIQUIS ) 5 MG TABS tablet Take 1 tablet (5 mg total) by mouth 2 (two) times daily. 04/08/23 05/30/23  Christobal Guadalajara, MD  buPROPion (WELLBUTRIN XL) 150 MG 24 hr tablet Take 150 mg by mouth daily.    [provider]  Cetirizine HCl 10 MG CAPS Take by mouth.    [provider]  cyanocobalamin  (VITAMIN B12) 1000 MCG tablet Take 1,000 mcg by mouth daily.    [provider]  furosemide  (LASIX ) 20 MG tablet Take 1 tablet (20 mg total) by mouth daily. 04/29/23   Hongalgi, Anand D, MD  gabapentin  (NEURONTIN ) 100 MG capsule Take 2 capsules (200 mg total) by mouth 3 (three) times daily. Patient taking differently: Take 100 mg by mouth 3 (three) times daily. 04/28/23   Hongalgi, Anand D, MD  guaiFENesin  (ROBITUSSIN) 100 MG/5ML liquid Take 5 mLs by mouth every 4 (four)  hours as needed for cough or to loosen phlegm.    [provider]  melatonin 3 MG TABS tablet Take 3 mg by mouth at bedtime.    [provider]  metoprolol  tartrate (LOPRESSOR ) 25 MG tablet Take 1 tablet (25 mg total) by mouth 2 (two) times daily. 06/10/23 07/10/23  Vernon Ranks, MD  ondansetron  (ZOFRAN ) 4 MG tablet Take 1 tablet (4 mg total) by mouth every 8 (eight) hours as needed for nausea or vomiting. 04/08/23   Christobal Guadalajara, MD  phenazopyridine  (PYRIDIUM ) 100 MG tablet Take 1 tablet (100 mg total) by mouth 3 (three) times daily as needed (dysurea). 04/08/23   Christobal Guadalajara, MD  potassium chloride  SA (KLOR-CON  M) 10 MEQ tablet Take 1 tablet (10 mEq total) by mouth daily. 04/29/23   Hongalgi, Anand D, MD  pramipexole  (MIRAPEX ) 0.25 MG tablet Take 1 tablet (0.25 mg total) by mouth 3 (three) times daily. 06/10/23 07/10/23  Pahwani, Ravi, MD    Allergies: Sulfa antibiotics, Coreg [carvedilol], Motrin [ibuprofen], Toprol  xl [metoprolol  tartrate], Doxycycline  hyclate, Flovent hfa [fluticasone], and Statins    Review of Systems  Updated Vital Signs BP (!) 157/80   Pulse (!) 109   Temp 98.1 F (36.7 C) (Oral)   SpO2 96%   Physical Exam Vitals and nursing note reviewed.  Constitutional:      General: She is not in acute distress.  Appearance: She is well-developed. She is not diaphoretic.  HENT:     Head: Normocephalic and atraumatic.     Right Ear: External ear normal.     Left Ear: External ear normal.  Eyes:     General: No scleral icterus.       Right eye: No discharge.        Left eye: No discharge.     Conjunctiva/sclera: Conjunctivae normal.  Neck:     Trachea: No tracheal deviation.  Cardiovascular:     Rate and Rhythm: Normal rate and regular rhythm.  Pulmonary:     Effort: Pulmonary effort is normal. No respiratory distress.     Breath sounds: Normal breath sounds. No stridor. No wheezing or rales.  Abdominal:     General: Bowel sounds are normal. There is no  distension.     Palpations: Abdomen is soft.     Tenderness: There is no abdominal tenderness. There is no guarding or rebound.  Genitourinary:    Comments: Patient has strong urine odor Musculoskeletal:        General: Tenderness present. No deformity.     Cervical back: Neck supple.     Comments: Tenderness palpation bilateral lower extremities below the knee, no erythema no induration no fluctuance, no wounds noted, strong DP pulse  Skin:    General: Skin is warm and dry.     Findings: No rash.  Neurological:     General: No focal deficit present.     Mental Status: She is alert.     Cranial Nerves: No cranial nerve deficit, dysarthria or facial asymmetry.     Sensory: No sensory deficit.     Motor: No abnormal muscle tone or seizure activity.     Coordination: Coordination normal.  Psychiatric:        Mood and Affect: Mood normal.     (all labs ordered are listed, but only abnormal results are displayed) Labs Reviewed  CBC  COMPREHENSIVE METABOLIC PANEL WITH GFR  URINALYSIS, W/ REFLEX TO CULTURE (INFECTION SUSPECTED)    EKG: None  Radiology: No results found.   Procedures   Medications Ordered in the ED  morphine  (PF) 4 MG/ML injection 4 mg (has no administration in time range)    Clinical Course as of 12/20/23 2331  Sun Dec 20, 2023  2329 CBC normal.  Metabolic panel shows mild hypokalemia.  Lactic acid level normal. [JK]  2329 Chest x-ray without acute findings. [JK]    Clinical Course User Index [JK] Randol Simmonds, MD                                 Medical Decision Making Amount and/or Complexity of Data Reviewed Labs: ordered. Radiology: ordered.  Risk Prescription drug management.   Pt presents with complaints of leg pain, possible uti.    No evidence of vascular compromise on exam.  Her symptoms are bilateral and she does not have any erythema or significant edema.  Low suspicion for cellulitis DVT.  Patient does have history of chronic back  pain and it is possible her leg pain may be related to that versus neuropathy.  Patient was also concerned about possible UTI.  Urinalysis currently pending.  Care turned over to Dr Trine     Final diagnoses:  None    ED Discharge Orders     None          Randol Simmonds, MD 12/20/23  2331  

## 2023-12-20 NOTE — ED Triage Notes (Signed)
 Pt here from [facility] with c/o possible uti and bil leg pain

## 2023-12-20 NOTE — ED Notes (Signed)
 Pt is unable to provide urine sample at this time, female urinal placed beneath pt for when pt is ready. Pt declines in and out catheter at this time and request waiting to try to void using urinal.

## 2023-12-20 NOTE — ED Notes (Signed)
 Placed female urinal on pt as she was not able to void on bedpan.  Changed pt gown again due to having gotten moist earlier from soaked depend

## 2023-12-20 NOTE — ED Notes (Signed)
 Pt arrives in urine soaked depend, removed malodorous overfilled depend and advised pt that urine sample is needed, placed pt on bedpan to attempt and cath urine sample.

## 2023-12-21 DIAGNOSIS — R300 Dysuria: Secondary | ICD-10-CM | POA: Diagnosis not present

## 2023-12-21 LAB — URINALYSIS, W/ REFLEX TO CULTURE (INFECTION SUSPECTED)
Bilirubin Urine: NEGATIVE
Glucose, UA: NEGATIVE mg/dL
Ketones, ur: NEGATIVE mg/dL
Nitrite: POSITIVE — AB
Protein, ur: NEGATIVE mg/dL
Specific Gravity, Urine: 1.009 (ref 1.005–1.030)
pH: 6 (ref 5.0–8.0)

## 2023-12-21 MED ORDER — CEPHALEXIN 250 MG PO CAPS
500.0000 mg | ORAL_CAPSULE | Freq: Once | ORAL | Status: AC
  Administered 2023-12-21: 500 mg via ORAL
  Filled 2023-12-21: qty 2

## 2023-12-21 MED ORDER — CEPHALEXIN 500 MG PO CAPS: 500.0000 mg | ORAL_CAPSULE | Freq: Three times a day (TID) | ORAL | 0 refills | Status: AC

## 2023-12-21 NOTE — ED Provider Notes (Signed)
 I assumed care of this patient from previous provider.  Please see their note for further details of history, exam, and MDM.   Briefly patient is a 83 y.o. female from [facility] pending UA to assess for UTI.  UA is consistent with infection. Not septic. Oral Abx Tx. 1 st dose given.    The patient appears reasonably screened and/or stabilized for discharge and I doubt any other medical condition or other [facility] requiring further screening, evaluation, or treatment in the ED at this time. I have discussed the findings, Dx and Tx plan with the patient/family who expressed understanding and agree(s) with the plan. Discharge instructions discussed at length. The patient/family was given strict return precautions who verbalized understanding of the instructions. No further questions at time of discharge.  Disposition: Discharge  Condition: Good  ED Discharge Orders          Ordered    cephALEXin  (KEFLEX ) 500 MG capsule  3 times daily        12/21/23 0123              Follow Up: Caleen Dirks, MD [address] [place] [address] [phone]  Call  to schedule an appointment for close follow up      Shandell Jallow, Raynell Moder, MD 12/21/23 [phone]

## 2023-12-21 NOTE — ED Notes (Signed)
Call to [facility] for transport back to SNF

## 2023-12-21 NOTE — ED Notes (Signed)
 Call to SNF x3 no answer

## 2023-12-23 LAB — URINE CULTURE: Culture: >=100000 — AB

## 2023-12-24 ENCOUNTER — Telehealth (HOSPITAL_BASED_OUTPATIENT_CLINIC_OR_DEPARTMENT_OTHER): Payer: Self-pay | Admitting: Emergency Medicine

## 2023-12-24 NOTE — Telephone Encounter (Signed)
 Post ED Visit - Positive Culture Follow-up: Successful Patient Follow-Up  Culture assessed and recommendations reviewed by:  []  Rankin Dee, Pharm.D. []  Venetia Gully, [address].D., BCPS AQ-ID []  Garrel Crews, Pharm.D., BCPS []  Almarie Lunger, Pharm.D., BCPS []  Kennedy, [address].D., BCPS, AAHIVP []  Rosaline Bihari, Pharm.D., BCPS, AAHIVP []  Vernell Meier, PharmD, BCPS []  Latanya Hint, PharmD, BCPS []  Donald Medley, PharmD, BCPS [x]  Joesph Rocher, PharmD  Positive urine culture  []  Patient discharged without antimicrobial prescription and treatment is now indicated []  Organism is resistant to prescribed ED discharge antimicrobial []  Patient with positive blood cultures  Changes discussed with ED provider: Lavonia Pat New antibiotic prescription Levofloxacin 250 mg PO every day for three days if symptomatic Called & faxed to [facility]  Contacted patient's SNF date 12/24/2023, time [address] [phone] Faxed to [phone]   Denise Kelly 12/24/2023, 3:06 PM

## 2023-12-24 NOTE — Progress Notes (Signed)
 ED Antimicrobial Stewardship Positive Culture Follow Up   Denise Kelly is an 83 y.o. female who presented to [facility] on 12/20/2023 with a chief complaint of  Chief Complaint  Patient presents with   Urinary Tract Infection    Recent Results (from the past 720 hours)  Urine Culture     Status: Abnormal   Collection Time: 12/21/23 12:04 AM   Specimen: Urine, Random  Result Value Ref Range Status   Specimen Description URINE, RANDOM  Final   Special Requests   Final    NONE Reflexed from [id number] Performed at [facility], [address] [place], [place] [address]    Culture >=100,000 COLONIES/mL PROVIDENCIA STUARTII (A)  Final   Report Status 12/23/2023 FINAL  Final   Organism ID, Bacteria PROVIDENCIA STUARTII (A)  Final      Susceptibility   Providencia stuartii - MIC*    AMPICILLIN RESISTANT Resistant     CEFEPIME  <=0.12 SENSITIVE Sensitive     ERTAPENEM <=0.12 SENSITIVE Sensitive     CEFTRIAXONE  <=0.25 SENSITIVE Sensitive     CIPROFLOXACIN  <=0.06 SENSITIVE Sensitive     GENTAMICIN  RESISTANT Resistant     NITROFURANTOIN 256 RESISTANT Resistant     TRIMETH/SULFA <=20 SENSITIVE Sensitive     AMPICILLIN/SULBACTAM 16 INTERMEDIATE Intermediate     PIP/TAZO Value in next row Sensitive      <=4 SENSITIVEThis is a modified FDA-approved test that has been validated and its performance characteristics determined by the reporting laboratory.  This laboratory is certified under the Clinical Laboratory Improvement Amendments CLIA as qualified to perform high complexity clinical laboratory testing.    MEROPENEM Value in next row Sensitive      <=4 SENSITIVEThis is a modified FDA-approved test that has been validated and its performance characteristics determined by the reporting laboratory.  This laboratory is certified under the Clinical Laboratory Improvement Amendments CLIA as qualified to perform high complexity clinical laboratory testing.    * >=100,000 COLONIES/mL PROVIDENCIA  STUARTII    [x]  Treated with cephalexin , organism resistant to prescribed antimicrobial  Plan: Stop cephalexin . If patient is without signs/symptoms of UTI, no further antibiotics are needed. If patient has urinary symptoms suggestive of UTI, start levofloxacin 250 mg by mouth once daily x 3 days  ED Provider: Lavonia Pat, MD  B. Maegan Kebron Pulse, PharmD PGY-1 Pharmacy Resident  [facility] 12/24/2023 9:31 AM  Monday - Friday phone -  [phone] Saturday - Sunday phone - [phone]
